# Patient Record
Sex: Male | Born: 1950 | Race: White | Hispanic: No | Marital: Single | Smoking: Current every day smoker
Health system: Southern US, Community
[De-identification: ages and names within clinical notes are randomized; demographics above are authoritative.]

## PROBLEM LIST (undated history)

## (undated) DIAGNOSIS — I219 Acute myocardial infarction, unspecified: Secondary | ICD-10-CM

## (undated) DIAGNOSIS — F32A Depression, unspecified: Secondary | ICD-10-CM

## (undated) DIAGNOSIS — Z923 Personal history of irradiation: Secondary | ICD-10-CM

## (undated) DIAGNOSIS — R0789 Other chest pain: Secondary | ICD-10-CM

## (undated) DIAGNOSIS — R002 Palpitations: Secondary | ICD-10-CM

## (undated) DIAGNOSIS — F17200 Nicotine dependence, unspecified, uncomplicated: Secondary | ICD-10-CM

## (undated) DIAGNOSIS — F419 Anxiety disorder, unspecified: Secondary | ICD-10-CM

## (undated) DIAGNOSIS — R0600 Dyspnea, unspecified: Secondary | ICD-10-CM

## (undated) DIAGNOSIS — J449 Chronic obstructive pulmonary disease, unspecified: Secondary | ICD-10-CM

## (undated) DIAGNOSIS — Z9221 Personal history of antineoplastic chemotherapy: Secondary | ICD-10-CM

## (undated) DIAGNOSIS — I2510 Atherosclerotic heart disease of native coronary artery without angina pectoris: Secondary | ICD-10-CM

## (undated) DIAGNOSIS — F410 Panic disorder [episodic paroxysmal anxiety] without agoraphobia: Secondary | ICD-10-CM

## (undated) DIAGNOSIS — C801 Malignant (primary) neoplasm, unspecified: Secondary | ICD-10-CM

## (undated) DIAGNOSIS — M8580 Other specified disorders of bone density and structure, unspecified site: Secondary | ICD-10-CM

## (undated) DIAGNOSIS — F329 Major depressive disorder, single episode, unspecified: Secondary | ICD-10-CM

## (undated) HISTORY — DX: Chronic obstructive pulmonary disease, unspecified: J44.9

## (undated) HISTORY — DX: Atherosclerotic heart disease of native coronary artery without angina pectoris: I25.10

## (undated) HISTORY — DX: Nicotine dependence, unspecified, uncomplicated: F17.200

## (undated) HISTORY — DX: Personal history of irradiation: Z92.3

## (undated) HISTORY — DX: Other specified disorders of bone density and structure, unspecified site: M85.80

---

## 1978-12-13 HISTORY — PX: INGUINAL HERNIA REPAIR: SHX194

## 1979-03-15 HISTORY — PX: HERNIA REPAIR: SHX51

## 1999-09-05 ENCOUNTER — Encounter: Payer: Self-pay | Admitting: General Practice

## 1999-09-05 ENCOUNTER — Encounter: Admission: RE | Admit: 1999-09-05 | Discharge: 1999-09-05 | Payer: Self-pay | Admitting: General Practice

## 1999-09-06 ENCOUNTER — Encounter: Admission: RE | Admit: 1999-09-06 | Discharge: 1999-09-06 | Payer: Self-pay | Admitting: General Practice

## 1999-09-06 ENCOUNTER — Encounter: Payer: Self-pay | Admitting: General Practice

## 1999-11-01 ENCOUNTER — Ambulatory Visit (HOSPITAL_COMMUNITY): Admission: RE | Admit: 1999-11-01 | Discharge: 1999-11-01 | Payer: Self-pay | Admitting: Neurosurgery

## 1999-11-01 ENCOUNTER — Encounter: Payer: Self-pay | Admitting: Neurosurgery

## 1999-11-15 ENCOUNTER — Ambulatory Visit (HOSPITAL_COMMUNITY): Admission: RE | Admit: 1999-11-15 | Discharge: 1999-11-15 | Payer: Self-pay | Admitting: Neurosurgery

## 1999-11-15 ENCOUNTER — Encounter: Payer: Self-pay | Admitting: Neurosurgery

## 1999-11-29 ENCOUNTER — Ambulatory Visit (HOSPITAL_COMMUNITY): Admission: RE | Admit: 1999-11-29 | Discharge: 1999-11-29 | Payer: Self-pay | Admitting: Neurosurgery

## 1999-11-29 ENCOUNTER — Encounter: Payer: Self-pay | Admitting: Neurosurgery

## 2000-07-14 HISTORY — PX: SPINE SURGERY: SHX786

## 2000-12-24 HISTORY — PX: BACK SURGERY: SHX140

## 2001-11-26 ENCOUNTER — Encounter: Payer: Self-pay | Admitting: Neurosurgery

## 2001-11-26 ENCOUNTER — Ambulatory Visit (HOSPITAL_COMMUNITY): Admission: RE | Admit: 2001-11-26 | Discharge: 2001-11-26 | Payer: Self-pay | Admitting: Neurosurgery

## 2001-12-21 ENCOUNTER — Encounter: Payer: Self-pay | Admitting: Neurosurgery

## 2001-12-24 ENCOUNTER — Ambulatory Visit (HOSPITAL_COMMUNITY): Admission: RE | Admit: 2001-12-24 | Discharge: 2001-12-25 | Payer: Self-pay | Admitting: Neurosurgery

## 2001-12-24 ENCOUNTER — Encounter: Payer: Self-pay | Admitting: Neurosurgery

## 2002-01-17 ENCOUNTER — Encounter: Admission: RE | Admit: 2002-01-17 | Discharge: 2002-02-18 | Payer: Self-pay | Admitting: Neurosurgery

## 2002-03-02 ENCOUNTER — Ambulatory Visit (HOSPITAL_COMMUNITY): Admission: RE | Admit: 2002-03-02 | Discharge: 2002-03-02 | Payer: Self-pay | Admitting: Neurosurgery

## 2002-03-02 ENCOUNTER — Encounter: Payer: Self-pay | Admitting: Neurosurgery

## 2002-03-23 ENCOUNTER — Encounter: Admission: RE | Admit: 2002-03-23 | Discharge: 2002-04-12 | Payer: Self-pay

## 2002-04-22 ENCOUNTER — Encounter: Admission: RE | Admit: 2002-04-22 | Discharge: 2002-07-21 | Payer: Self-pay

## 2002-05-02 ENCOUNTER — Encounter: Admission: RE | Admit: 2002-05-02 | Discharge: 2002-05-23 | Payer: Self-pay

## 2003-07-15 HISTORY — PX: CORONARY STENT PLACEMENT: SHX1402

## 2003-12-27 ENCOUNTER — Inpatient Hospital Stay (HOSPITAL_COMMUNITY): Admission: EM | Admit: 2003-12-27 | Discharge: 2003-12-29 | Payer: Self-pay | Admitting: Emergency Medicine

## 2004-01-30 ENCOUNTER — Ambulatory Visit (HOSPITAL_COMMUNITY): Admission: RE | Admit: 2004-01-30 | Discharge: 2004-01-31 | Payer: Self-pay | Admitting: Cardiology

## 2006-02-02 ENCOUNTER — Ambulatory Visit: Payer: Self-pay | Admitting: Family Medicine

## 2006-06-19 ENCOUNTER — Ambulatory Visit: Payer: Self-pay | Admitting: Internal Medicine

## 2006-06-19 LAB — CONVERTED CEMR LAB
RBC count: 4.4 10*6/uL
WBC, blood: 8.7 10*3/uL

## 2006-06-24 ENCOUNTER — Encounter: Payer: Self-pay | Admitting: Internal Medicine

## 2006-06-24 DIAGNOSIS — G4700 Insomnia, unspecified: Secondary | ICD-10-CM | POA: Insufficient documentation

## 2006-06-24 DIAGNOSIS — M1990 Unspecified osteoarthritis, unspecified site: Secondary | ICD-10-CM | POA: Insufficient documentation

## 2006-06-24 DIAGNOSIS — I499 Cardiac arrhythmia, unspecified: Secondary | ICD-10-CM | POA: Insufficient documentation

## 2006-06-24 DIAGNOSIS — I2510 Atherosclerotic heart disease of native coronary artery without angina pectoris: Secondary | ICD-10-CM | POA: Insufficient documentation

## 2006-06-24 DIAGNOSIS — F329 Major depressive disorder, single episode, unspecified: Secondary | ICD-10-CM | POA: Insufficient documentation

## 2006-06-24 DIAGNOSIS — I252 Old myocardial infarction: Secondary | ICD-10-CM | POA: Insufficient documentation

## 2006-06-24 DIAGNOSIS — M545 Low back pain, unspecified: Secondary | ICD-10-CM | POA: Insufficient documentation

## 2006-06-24 DIAGNOSIS — F19239 Other psychoactive substance dependence with withdrawal, unspecified: Secondary | ICD-10-CM | POA: Insufficient documentation

## 2006-06-24 DIAGNOSIS — F411 Generalized anxiety disorder: Secondary | ICD-10-CM | POA: Insufficient documentation

## 2006-06-24 DIAGNOSIS — K5900 Constipation, unspecified: Secondary | ICD-10-CM | POA: Insufficient documentation

## 2006-06-24 DIAGNOSIS — F19939 Other psychoactive substance use, unspecified with withdrawal, unspecified: Secondary | ICD-10-CM | POA: Insufficient documentation

## 2006-07-03 ENCOUNTER — Ambulatory Visit: Payer: Self-pay | Admitting: Internal Medicine

## 2006-07-31 ENCOUNTER — Ambulatory Visit: Payer: Self-pay | Admitting: Internal Medicine

## 2006-07-31 LAB — CONVERTED CEMR LAB
Cholesterol: 134 mg/dL (ref 0–200)
Digitoxin Lvl: 0.6 ng/mL — ABNORMAL LOW (ref 0.8–2.0)
HDL: 42 mg/dL (ref >39)
LDL Cholesterol: 75 mg/dL (ref 0–99)
Total CHOL/HDL Ratio: 3.2
Triglycerides: 84 mg/dL (ref <150)
VLDL: 17 mg/dL (ref 0–40)

## 2006-08-19 ENCOUNTER — Ambulatory Visit: Payer: Self-pay | Admitting: Internal Medicine

## 2006-08-19 DIAGNOSIS — J439 Emphysema, unspecified: Secondary | ICD-10-CM | POA: Insufficient documentation

## 2006-08-19 DIAGNOSIS — H539 Unspecified visual disturbance: Secondary | ICD-10-CM | POA: Insufficient documentation

## 2006-08-19 LAB — CONVERTED CEMR LAB: Inflenza A Ag: NEGATIVE

## 2006-08-24 ENCOUNTER — Telehealth (INDEPENDENT_AMBULATORY_CARE_PROVIDER_SITE_OTHER): Payer: Self-pay | Admitting: *Deleted

## 2006-08-25 ENCOUNTER — Encounter (INDEPENDENT_AMBULATORY_CARE_PROVIDER_SITE_OTHER): Payer: Self-pay | Admitting: Internal Medicine

## 2006-08-28 DIAGNOSIS — H269 Unspecified cataract: Secondary | ICD-10-CM | POA: Insufficient documentation

## 2006-09-18 ENCOUNTER — Encounter (INDEPENDENT_AMBULATORY_CARE_PROVIDER_SITE_OTHER): Payer: Self-pay | Admitting: Internal Medicine

## 2007-03-04 ENCOUNTER — Encounter (INDEPENDENT_AMBULATORY_CARE_PROVIDER_SITE_OTHER): Payer: Self-pay | Admitting: Internal Medicine

## 2007-03-12 ENCOUNTER — Encounter (INDEPENDENT_AMBULATORY_CARE_PROVIDER_SITE_OTHER): Payer: Self-pay | Admitting: Internal Medicine

## 2007-05-28 ENCOUNTER — Ambulatory Visit: Payer: Self-pay | Admitting: Internal Medicine

## 2007-05-28 DIAGNOSIS — E7800 Pure hypercholesterolemia, unspecified: Secondary | ICD-10-CM | POA: Insufficient documentation

## 2007-05-31 LAB — CONVERTED CEMR LAB
ALT: 17 units/L (ref 0–53)
AST: 19 units/L (ref 0–37)
Albumin: 4.2 g/dL (ref 3.5–5.2)
Alkaline Phosphatase: 54 units/L (ref 39–117)
BUN: 22 mg/dL (ref 6–23)
Basophils Absolute: 0 10*3/uL (ref 0.0–0.1)
Basophils Relative: 0 % (ref 0–1)
CO2: 25 meq/L (ref 19–32)
Calcium: 9.3 mg/dL (ref 8.4–10.5)
Chloride: 107 meq/L (ref 96–112)
Cholesterol: 139 mg/dL (ref 0–200)
Creatinine, Ser: 1.17 mg/dL (ref 0.40–1.50)
Digitoxin Lvl: 0.9 ng/mL (ref 0.8–2.0)
Eosinophils Absolute: 0.2 10*3/uL (ref 0.2–0.7)
Eosinophils Relative: 2 % (ref 0–5)
Glucose, Bld: 85 mg/dL (ref 70–99)
HCT: 39.1 % (ref 39.0–52.0)
HDL: 44 mg/dL (ref >39)
Hemoglobin: 13.1 g/dL (ref 13.0–17.0)
LDL Cholesterol: 82 mg/dL (ref 0–99)
Lymphocytes Relative: 25 % (ref 12–46)
Lymphs Abs: 2.2 10*3/uL (ref 0.7–4.0)
MCHC: 33.5 g/dL (ref 30.0–36.0)
MCV: 89.5 fL (ref 78.0–100.0)
Monocytes Absolute: 0.6 10*3/uL (ref 0.1–1.0)
Monocytes Relative: 7 % (ref 3–12)
Neutro Abs: 5.8 10*3/uL (ref 1.7–7.7)
Neutrophils Relative %: 66 % (ref 43–77)
Platelets: 211 10*3/uL (ref 150–400)
Potassium: 4.9 meq/L (ref 3.5–5.3)
RBC: 4.37 M/uL (ref 4.22–5.81)
RDW: 15.4 % (ref 11.5–15.5)
Sodium: 141 meq/L (ref 135–145)
Total Bilirubin: 0.5 mg/dL (ref 0.3–1.2)
Total CHOL/HDL Ratio: 3.2
Total Protein: 7.3 g/dL (ref 6.0–8.3)
Triglycerides: 63 mg/dL (ref <150)
VLDL: 13 mg/dL (ref 0–40)
WBC: 8.9 10*3/uL (ref 4.0–10.5)

## 2007-10-20 ENCOUNTER — Ambulatory Visit: Payer: Self-pay | Admitting: Internal Medicine

## 2007-10-20 DIAGNOSIS — B351 Tinea unguium: Secondary | ICD-10-CM | POA: Insufficient documentation

## 2007-10-20 DIAGNOSIS — R252 Cramp and spasm: Secondary | ICD-10-CM | POA: Insufficient documentation

## 2007-10-21 ENCOUNTER — Encounter (INDEPENDENT_AMBULATORY_CARE_PROVIDER_SITE_OTHER): Payer: Self-pay | Admitting: Internal Medicine

## 2007-11-05 ENCOUNTER — Telehealth (INDEPENDENT_AMBULATORY_CARE_PROVIDER_SITE_OTHER): Payer: Self-pay | Admitting: Internal Medicine

## 2007-11-22 ENCOUNTER — Telehealth (INDEPENDENT_AMBULATORY_CARE_PROVIDER_SITE_OTHER): Payer: Self-pay | Admitting: *Deleted

## 2008-01-20 ENCOUNTER — Ambulatory Visit: Payer: Self-pay | Admitting: Internal Medicine

## 2008-01-20 DIAGNOSIS — N63 Unspecified lump in unspecified breast: Secondary | ICD-10-CM | POA: Insufficient documentation

## 2008-01-24 ENCOUNTER — Ambulatory Visit (HOSPITAL_COMMUNITY): Admission: RE | Admit: 2008-01-24 | Discharge: 2008-01-24 | Payer: Self-pay | Admitting: Internal Medicine

## 2008-01-24 ENCOUNTER — Encounter (INDEPENDENT_AMBULATORY_CARE_PROVIDER_SITE_OTHER): Payer: Self-pay | Admitting: Internal Medicine

## 2008-02-02 ENCOUNTER — Ambulatory Visit (HOSPITAL_COMMUNITY): Admission: RE | Admit: 2008-02-02 | Discharge: 2008-02-02 | Payer: Self-pay | Admitting: General Surgery

## 2008-02-02 ENCOUNTER — Encounter (INDEPENDENT_AMBULATORY_CARE_PROVIDER_SITE_OTHER): Payer: Self-pay | Admitting: General Surgery

## 2008-03-03 ENCOUNTER — Encounter (INDEPENDENT_AMBULATORY_CARE_PROVIDER_SITE_OTHER): Payer: Self-pay | Admitting: Internal Medicine

## 2008-03-29 ENCOUNTER — Encounter (INDEPENDENT_AMBULATORY_CARE_PROVIDER_SITE_OTHER): Payer: Self-pay | Admitting: Internal Medicine

## 2008-09-13 ENCOUNTER — Ambulatory Visit (HOSPITAL_COMMUNITY): Admission: RE | Admit: 2008-09-13 | Discharge: 2008-09-13 | Payer: Self-pay | Admitting: Internal Medicine

## 2008-09-13 ENCOUNTER — Ambulatory Visit: Payer: Self-pay | Admitting: Internal Medicine

## 2008-09-20 ENCOUNTER — Telehealth (INDEPENDENT_AMBULATORY_CARE_PROVIDER_SITE_OTHER): Payer: Self-pay | Admitting: Internal Medicine

## 2009-01-09 ENCOUNTER — Telehealth (INDEPENDENT_AMBULATORY_CARE_PROVIDER_SITE_OTHER): Payer: Self-pay | Admitting: Internal Medicine

## 2009-01-10 ENCOUNTER — Encounter (INDEPENDENT_AMBULATORY_CARE_PROVIDER_SITE_OTHER): Payer: Self-pay | Admitting: Internal Medicine

## 2009-03-16 ENCOUNTER — Emergency Department (HOSPITAL_COMMUNITY): Admission: EM | Admit: 2009-03-16 | Discharge: 2009-03-16 | Payer: Self-pay | Admitting: Emergency Medicine

## 2009-09-07 HISTORY — PX: NM MYOCAR PERF WALL MOTION: HXRAD629

## 2009-10-24 ENCOUNTER — Ambulatory Visit (HOSPITAL_COMMUNITY): Admission: RE | Admit: 2009-10-24 | Discharge: 2009-10-24 | Payer: Self-pay | Admitting: General Surgery

## 2010-04-08 ENCOUNTER — Emergency Department (HOSPITAL_COMMUNITY): Admission: EM | Admit: 2010-04-08 | Discharge: 2010-04-08 | Payer: Self-pay | Admitting: Emergency Medicine

## 2010-04-08 ENCOUNTER — Encounter: Payer: Self-pay | Admitting: Orthopedic Surgery

## 2010-04-11 ENCOUNTER — Ambulatory Visit: Payer: Self-pay | Admitting: Orthopedic Surgery

## 2010-04-11 DIAGNOSIS — S43109A Unspecified dislocation of unspecified acromioclavicular joint, initial encounter: Secondary | ICD-10-CM | POA: Insufficient documentation

## 2010-05-02 ENCOUNTER — Ambulatory Visit: Payer: Self-pay | Admitting: Orthopedic Surgery

## 2010-05-03 ENCOUNTER — Encounter: Payer: Self-pay | Admitting: Orthopedic Surgery

## 2010-05-14 ENCOUNTER — Encounter (HOSPITAL_COMMUNITY)
Admission: RE | Admit: 2010-05-14 | Discharge: 2010-06-13 | Payer: Self-pay | Source: Home / Self Care | Admitting: Orthopedic Surgery

## 2010-05-21 ENCOUNTER — Ambulatory Visit: Payer: Self-pay | Admitting: Orthopedic Surgery

## 2010-05-27 ENCOUNTER — Encounter: Payer: Self-pay | Admitting: Orthopedic Surgery

## 2010-05-27 ENCOUNTER — Telehealth: Payer: Self-pay | Admitting: Orthopedic Surgery

## 2010-05-28 ENCOUNTER — Telehealth: Payer: Self-pay | Admitting: Orthopedic Surgery

## 2010-06-11 ENCOUNTER — Encounter: Payer: Self-pay | Admitting: Orthopedic Surgery

## 2010-07-02 ENCOUNTER — Ambulatory Visit: Payer: Self-pay | Admitting: Orthopedic Surgery

## 2010-07-09 ENCOUNTER — Telehealth: Payer: Self-pay | Admitting: Orthopedic Surgery

## 2010-07-09 ENCOUNTER — Encounter (INDEPENDENT_AMBULATORY_CARE_PROVIDER_SITE_OTHER): Payer: Self-pay | Admitting: *Deleted

## 2010-07-17 ENCOUNTER — Telehealth: Payer: Self-pay | Admitting: Orthopedic Surgery

## 2010-07-24 ENCOUNTER — Encounter: Payer: Self-pay | Admitting: Orthopedic Surgery

## 2010-08-13 NOTE — Progress Notes (Signed)
Summary: Medical records faxed as per req Dr Hilda Lias  Phone Note Outgoing Call   Call placed to: Specialist Summary of Call: Faxed all office notes as per request and authorization to Dr Hilda Lias. Initial call taken by: Cammie Sickle,  May 28, 2010 1:19 PM

## 2010-08-13 NOTE — Letter (Signed)
[facility]   Imported By: Magdalene River 09/24/2006 14:31:23  _____________________________________________________________________  External Attachment:    Type:   Image     Comment:   External Document

## 2010-08-13 NOTE — Letter (Signed)
Summary: [facility]  [facility]   Imported By: Magdalene River 09/01/2006 13:45:35  _____________________________________________________________________  External Attachment:    Type:   Image     Comment:   External Document
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
## 2010-08-13 NOTE — Progress Notes (Signed)
Summary: wants Percocet prescription  Phone Note Call from Patient   Summary of Call: Keevan Wolz (08/16/2050) wants new Percocet prescription [phone] Initial call taken by: Jacklynn Ganong,  May 27, 2010 1:10 PM    Prescriptions: PERCOCET 5-325 MG TABS (OXYCODONE-ACETAMINOPHEN) 1 by mouth q 4 as needed pain  #84 x 0   Entered and Authorized by:   Fuller Canada MD   Signed by:   Fuller Canada MD on 05/27/2010   Method used:   Print then Give to Patient   RxID:   [id number]

## 2010-08-13 NOTE — Letter (Signed)
Summary: Historic Patient File  Historic Patient File   Imported By: Curtis Sites 05/03/2009 10:11:36  _____________________________________________________________________  External Attachment:    Type:   Image     Comment:   External Document

## 2010-08-13 NOTE — Progress Notes (Signed)
Summary: Need info faxed for disability forms  Phone Note Call from Patient   Caller: Patient Call For: NURSE Summary of Call: need dates he saw MD here and why.need names of surgeon he was referred to and dates. Please fax to [phone]. Initial call taken by: Carlye Grippe,  January 09, 2009 2:50 PM  Follow-up for Phone Call        He can request to have his medical records sent if he needs all that information.   Follow-up by: Erle Crocker MD,  January 10, 2009 8:16 AM  Additional Follow-up for Phone Call Additional follow up Details #1::        MD informed that patient need dates &reasons of previous visits with MD and name of surgeon he was referred to. dates/reasons and surgeon name faxed to patient. Additional Follow-up by: Carlye Grippe,  January 10, 2009 8:38 AM

## 2010-08-13 NOTE — Miscellaneous (Signed)
  last percocet script   Clinical Lists Changes

## 2010-08-13 NOTE — Consult Note (Signed)
Summary: [facility]  [facility]   Imported By: Pablo Lawrence 03/23/2007 15:08:10  _____________________________________________________________________  External Attachment:    Type:   Image     Comment:   External Document

## 2010-08-13 NOTE — Miscellaneous (Signed)
Summary: Rehab initial evaluation  Rehab initial evaluation   Imported By: Jacklynn Ganong 05/21/2010 08:27:46  _____________________________________________________________________  External Attachment:    Type:   Image     Comment:   External Document

## 2010-08-13 NOTE — Progress Notes (Signed)
----   Converted from flag ---- ---- 08/20/2006 1:55 PM, Lutricia Horsfall wrote: Pt scheduled for appt. with Dr. Lita Mains 08/25/06 at 0850.  Pt notified.SG  ---- 08/20/2006 1:55 PM, Erle Crocker MD wrote: Pls. schedule for appt. to evaluation of abnormal night vision with halos around vision--normal digoxin level.  ---- 08/20/2006 11:57 AM, Lutricia Horsfall wrote: per Dr. Lita Mains office, if medical diagnosis they are able to bill Medicare-if he has not met his deductible he will be responsible for $130-SG  ---- 08/19/2006 4:14 PM, Erle Crocker MD wrote: Pls. see if with a referral Dr. Lita Mains can bill medicare for evaluation of patient's complaint of "halos" around his vision. ------------------------------

## 2010-08-13 NOTE — Progress Notes (Signed)
Summary: Update on back pain  Phone Note Call from Patient   Summary of Call: Pt called states that since stopping neurontin, back pain has gotten quite a bit better, states with neurontin pain was 9/10, states that it is now back to baseline of 6-7/10. Initial call taken by: Lutricia Horsfall LPN,  September 20, 2008 8:51 AM  Follow-up for Phone Call        Noted. Will send copy of office and phone notes to psychiatrist, Dr. Donell Beers. Follow-up by: Erle Crocker MD,  September 20, 2008 9:17 AM
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
## 2010-08-13 NOTE — Op Note (Signed)
Summary: [facility]  [facility]   Imported By: Donneta Romberg 03/03/2008 17:20:05  _____________________________________________________________________  External Attachment:    Type:   Image     Comment:   External Document

## 2010-08-13 NOTE — Miscellaneous (Signed)
Summary: Rehab Report  Rehab Report   Imported By: Cammie Sickle 06/12/2010 18:42:16  _____________________________________________________________________  External Attachment:    Type:   Image     Comment:   External Document

## 2010-08-13 NOTE — Letter (Signed)
Summary: patient request for visit dates  patient request for visit dates   Imported By: Curtis Sites 01/17/2009 10:31:32  _____________________________________________________________________  External Attachment:    Type:   Image     Comment:   External Document

## 2010-08-13 NOTE — Letter (Signed)
Summary: Influenza vaccine document  Influenza vaccine document   Imported By: Jacklynn Ganong 05/03/2010 08:15:32  _____________________________________________________________________  External Attachment:    Type:   Image     Comment:   External Document
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
## 2010-08-13 NOTE — Letter (Signed)
Summary: History form  History form   Imported By: Jacklynn Ganong 04/11/2010 15:58:43  _____________________________________________________________________  External Attachment:    Type:   Image     Comment:   External Document
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
## 2010-08-13 NOTE — Progress Notes (Signed)
Summary: Lipitor vs Crestor  Phone Note Call from Patient   Reason for Call: Talk to Nurse Complaint: Headache Summary of Call: patient's cramping stopped after not taking  the simvastin for 2 weeks, he then started taking it again and the cramps came back.he is worried because he had a heart attack 3 years and says he was on lipitor but it isn't covered anymore and didn't know what to do, if he had to change to another medication or change the dosage of his simvastin. Please advise  Initial call taken by: Donneta Romberg,  November 05, 2007 11:09 AM  Follow-up for Phone Call        Provider Notified Follow-up by: Lutricia Horsfall,  November 05, 2007 11:36 AM  Additional Follow-up for Phone Call Additional follow up Details #1::        He will need to be on a statin but he is less likely to have muscle aches with Lipitor or Crestor.  He needs to provide [place] with his insurance information so we can try to get one of these approved. Additional Follow-up by: Erle Crocker MD,  November 05, 2007 11:41 AM    Additional Follow-up for Phone Call Additional follow up Details #2::    Left message for patient to call back.  .................................................................Marland KitchenMarland KitchenLutricia Horsfall  November 05, 2007 11:46 AM   Additional Follow-up for Phone Call Additional follow up Details #3:: Details for Additional Follow-up Action Taken: Advised patient that he needs to contact his insurance, pt verbalized understanding. Additional Follow-up by: Lutricia Horsfall,  November 08, 2007 8:54 AM

## 2010-08-13 NOTE — Assessment & Plan Note (Signed)
Summary: 2 WK RE-CK LT SHOULDER/[employer] HMO/CAF   Visit Type:  Follow-up Referring Provider:  ap er Primary Provider:  [facility]  CC:  left shoulder pain.  History of Present Illness: VZ:DGLO III a.c. separation  DOI 04-08-10.  Treatment: Physical therapy, medication.  Complaints: He complains of pain, but better movement.  Today, scheduled for: recheck.  AP Xrays 9/26/1 left shoulder, hit head also.  Meds: Plavix, Crestor, Lisinopril, Venlafaxine, Trazadone, xanax, ASA, Hydroxy 50mg  5 daily, Percocet 5   continue on the Percocet at this time. He is progressing well with his physical therapy according to his notes.  No x-rays are needed.  He'll come in next week to get his prescription.  Allergies: No Known Drug Allergies  Physical Exam  Additional Exam:  LEFT shoulder. Swelling is going down he still has prominence of the LEFT clavicle is neurovascularly intact in his LEFT upper extremity with improving. 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 Patient Level II (75643)  Patient Instructions: 1)  Continue Physical therapy and return in 6 weeks    Orders Added: 1)  Est. Patient Level II [32951]

## 2010-08-13 NOTE — Miscellaneous (Signed)
Summary: Surgery Referral  Clinical Lists Changes  Orders: Added new Referral order of Surgical Referral (Surgery) - Signed   Appt scheduled for 01/27/08 at 1000 with Dr. Leticia Penna.  Pt notified.  Sherrie Gardner  January 24, 2008 2:11 PM

## 2010-08-13 NOTE — Progress Notes (Signed)
Summary: 11/19/07 cultre result  Phone Note Outgoing Call   Call placed by: Sonny Dandy,  Nov 22, 2007 9:24 AM Summary of Call: Results given to patient, voices understanding   Initial call taken by: Sonny Dandy,  Nov 22, 2007 9:24 AM

## 2010-08-13 NOTE — Assessment & Plan Note (Signed)
Summary: ?flu   Vital Signs:  Patient Profile:   60 Years Old Male Height:     69 inches (175.26 cm) O2 Sat:      94 % Temp:     97.6 degrees F oral Pulse rate:   77 / minute Resp:     8 per minute BP sitting:   104 / 58  Pt. in pain?   no  Vitals Entered By: Lutricia Horsfall (August 19, 2006 3:26 PM) Oxygen therapy Room Air              Is Patient Diabetic? No Nutritional Status Normal  Have you ever been in a relationship where you felt threatened, hurt or afraid?No   Does patient need assistance? Functional Status Self care Ambulation Normal   Pt states that psy. had started him on new medication and he began to have cold chills and sweats.  Psy told him that he believed it was the flu and to see PCP.  Chief Complaint:  psychiatrist says i have the flu.  History of Present Illness: Psychiatrist changed medications 08/06/06 due to high dose of trazodone and feeling more anxious.  Since then pt c/o chills, headaches w/o visual symptoms x halo that have been going on for a while.  Called psychiatrist 31 Jan who told pt it wasn't from the medication change and that he had the flu and needed to see his regular doctor.  Pt concerned because dr. didn't give follow up for 6 months.  Notes changes ineffective for sleep. did not have flu shot this years but desires to have now.       This is a 60 years old male who presents with COPD follow up.  The patient complains of shortness of breath, but denies chest tightness, wheezing, and cough.  The symptoms often occur daily.  The patients functional status is limited with moderate activities.    Still concerned about halos around vision but says he doesn't have money to go to eye doctor because medicare doesn't pay for eye doctor.  Prior Medications: LISINOPRIL 5 MG TABS (LISINOPRIL) once daily XANAX 1 MG TABS (ALPRAZOLAM) qid TRIAMTERENE-HCTZ 37.5-25 MG TABS (TRIAMTERENE-HCTZ) once daily DIGITEK 0.125 MG TABS (DIGOXIN) once  daily ASPIRIN 325 MG TABS (ASPIRIN) once daily PLAVIX 75 MG TABS (CLOPIDOGREL BISULFATE) once daily HYDROXYZINE HCL 25 MG TABS (HYDROXYZINE HCL) 1 by mouth in morning, 2 by mouth in afternoon Current Allergies: No known allergies   Past Surgical History:    Reviewed history from 06/24/2006 and no changes required:       back surgery 2003       inguinal hernia repair 20 years ago       two stents.   Social History:    Single    Former Smoker   Risk Factors:  Tobacco use:  quit    Year quit:  2005 Passive smoke exposure:  no Drug use:  no HIV high-risk behavior:  no Alcohol use:  no Exercise:  no   Review of Systems       The patient complains of chills and sleep disorder.  The patient denies fever, weakness, and malaise.     Physical Exam  General:     thin, in no acute distress. Alert and oriented X 3.  Nose:     minimal Erythema and swelling with clear drainage.  Mouth:     No eyrthema or exudate.     Impression & Recommendations:  Problem # 1:  FEVER (ICD-780.6) subjective and doubt infectious esp. with negative flu swab.  Suspect with headaches this is a side effect of temazepam or even of uncontrolled depression and anxiety.   Problem # 2:  INSOMNIA (ICD-780.52) will start back on trazodone at 400mg  at bedtime.  instructed pt to inform psychiatrist that he was evaluated, doesn't have the flu and med was changed back.  Pt appears to need the elevated trazodone dose for his depression too.  Problem # 3:  DISTURBANCE, VISUAL NOS (ICD-368.9) ? cause but doubt this is from spiriva as he has been off this for 2 weeks.  will see if medicare will pay for ophtho with referral.  Problem # 4:  COPD (ICD-496) resume spiriva. The following medications were removed from the medication list:    Spiriva Handihaler 18 Mcg Caps (Tiotropium bromide monohydrate) .Marland Kitchen... 1 puff once daily   Medications Added to Medication List This Visit: 1)  Effexor 75 Mg Tabs  (Venlafaxine hcl) .... 2 by mouth two times a day 2)  Hydroxyzine Hcl 25 Mg Tabs (Hydroxyzine hcl) .Marland Kitchen.. 1 by mouth in morning, 2 by mouth in afternoon 3)  Trazodone Hcl 150 Mg Tabs (Trazodone hcl) .... 2.66 by mouth at bedtime  Other Orders: Flu A+B (57846)   Laboratory Results   Other Tests    Wet [place]/KOH Influenza: negative

## 2010-08-14 HISTORY — PX: SHOULDER SURGERY: SHX246

## 2010-08-15 NOTE — Consult Note (Signed)
Summary: Consult report from Dr. Teryl Lucy  Consult report from Dr. Teryl Lucy   Imported By: Jacklynn Ganong 07/29/2010 13:54:30  _____________________________________________________________________  External Attachment:    Type:   Image     Comment:   External Document

## 2010-08-15 NOTE — Progress Notes (Signed)
Summary: Wants Percocet prescription  Phone Note Call from Patient   Summary of Call: [name] (July 16, 2050) asked if you will write a Percocet prescription with enough to last until he sees Dr. Dion Saucier 07/24/10 [phone] Initial call taken by: Jacklynn Ganong,  July 17, 2010 3:26 PM  Follow-up for Phone Call        ok pick up script  Follow-up by: Fuller Canada MD,  July 17, 2010 3:34 PM    Prescriptions: PERCOCET 5-325 MG TABS (OXYCODONE-ACETAMINOPHEN) 1 by mouth q 4 as needed pain  #84 x 0   Entered and Authorized by:   Fuller Canada MD   Signed by:   Fuller Canada MD on 07/17/2010   Method used:   Print then Give to Patient   RxID:   [id number]

## 2010-08-15 NOTE — Miscellaneous (Signed)
Summary: Dr. Dion Saucier referral completed and faxed  Clinical Lists Changes

## 2010-08-15 NOTE — Assessment & Plan Note (Signed)
Summary: 6 WK LT [facility] HMO/CAF   Referring Provider:  ap er Primary Provider:  [facility]   History of Present Illness: This is a 60 year old male had a type III or type V a.c. separation treated conservatively with therapy he says his throat feels like there is pressure on it from the clavicle  He got a 2nd opinion from Dr. Hilda Lias rec Mumford procedure   I think he be better served if he is going to have surgery with a.c. joint and acromioclavicular coracoclavicular ligament reconstruction recommended he see Dr.Landau for that evaluation.  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 Patient Level II (09811)  Medications Added to Medication List This Visit: 1)  Percocet 5-325 Mg Tabs (Oxycodone-acetaminophen) .Marland Kitchen.. 1 q 6 as needed pain  Patient Instructions: 1)  Dr Dion Saucier Consult Gr 5 separation AC joint  Prescriptions: PERCOCET 5-325 MG TABS (OXYCODONE-ACETAMINOPHEN) 1 q 6 as needed pain  #40 x 0   Entered and Authorized by:   Fuller Canada MD   Signed by:   Fuller Canada MD on 07/02/2010   Method used:   Print then Give to Patient   RxID:   [id number]    Orders Added: 1)  Orthopedic Surgeon Referral Gaylord Shih Surgeon] 2)  Est. Patient Level II [78469]

## 2010-08-15 NOTE — Progress Notes (Signed)
Summary: patient said his [employer] accepted,but as out of network @[facility]  Phone Note Call from Patient   Caller: Patient Summary of Call: Patient called to relay that he spoke w/[facility] office; aware that our office faxed the referral today - states called to their office and that they are not in-network with his [facility] PPO Medicare there but that they will take this insurance as non-network and cover @70 %.  Initial call taken by: Cammie Sickle,  July 09, 2010 1:59 PM

## 2010-08-16 ENCOUNTER — Ambulatory Visit (HOSPITAL_BASED_OUTPATIENT_CLINIC_OR_DEPARTMENT_OTHER)
Admission: RE | Admit: 2010-08-16 | Discharge: 2010-08-17 | Disposition: A | Discharge status: Home / Self Care | Payer: Medicare PPO | Attending: Orthopedic Surgery | Admitting: Orthopedic Surgery

## 2010-08-16 DIAGNOSIS — S43109A Unspecified dislocation of unspecified acromioclavicular joint, initial encounter: Secondary | ICD-10-CM | POA: Insufficient documentation

## 2010-08-16 DIAGNOSIS — J449 Chronic obstructive pulmonary disease, unspecified: Secondary | ICD-10-CM | POA: Insufficient documentation

## 2010-08-16 DIAGNOSIS — I2510 Atherosclerotic heart disease of native coronary artery without angina pectoris: Secondary | ICD-10-CM | POA: Insufficient documentation

## 2010-08-16 DIAGNOSIS — J4489 Other specified chronic obstructive pulmonary disease: Secondary | ICD-10-CM | POA: Insufficient documentation

## 2010-08-16 DIAGNOSIS — Y929 Unspecified place or not applicable: Secondary | ICD-10-CM | POA: Insufficient documentation

## 2010-08-16 DIAGNOSIS — X58XXXA Exposure to other specified factors, initial encounter: Secondary | ICD-10-CM | POA: Insufficient documentation

## 2010-08-16 LAB — POCT I-STAT, CHEM 8
BUN: 19 mg/dL (ref 6–23)
Calcium, Ion: 1.11 mmol/L — ABNORMAL LOW (ref 1.12–1.32)
Chloride: 106 mEq/L (ref 96–112)
Creatinine, Ser: 0.9 mg/dL (ref 0.4–1.5)
Glucose, Bld: 95 mg/dL (ref 70–99)
HCT: 38 % — ABNORMAL LOW (ref 39.0–52.0)
Hemoglobin: 12.9 g/dL — ABNORMAL LOW (ref 13.0–17.0)
Potassium: 4.5 mEq/L (ref 3.5–5.1)
Sodium: 137 mEq/L (ref 135–145)
TCO2: 26 mmol/L (ref 0–100)

## 2010-08-27 NOTE — Op Note (Signed)
**Note [name]** NAMECON, ARGANBRIGHT NO.:  [id number]  MEDICAL RECORD NO.:  [id number]           PATIENT TYPE:  LOCATION:                                 FACILITY:  PHYSICIAN:  Eulas Post, MD    DATE OF BIRTH:  [birth year]  DATE OF PROCEDURE: DATE OF DISCHARGE:                              OPERATIVE REPORT   PREOPERATIVE DIAGNOSIS:  Left acromioclavicular joint dislocation.  POSTOPERATIVE DIAGNOSIS:  Left acromioclavicular joint dislocation.  OPERATIVE PROCEDURE:  Left acromioclavicular joint reconstruction.  ANESTHESIA:  General.  PREOPERATIVE INDICATIONS:  Mr. Deaunte Dente is a 60 year old gentleman who has had a now subacute left AC joint dislocation with disruption of the coracoclavicular ligaments.  He elected to undergo reconstruction of the coracoclavicular ligaments and AC joint repair.  The risks, benefits, and alternatives were discussed before the procedure including but not limited to the risks of infection, bleeding, nerve injury, hardware prominence, hardware failure, recurrent AC joint separation, chronic shoulder dysfunction, cardiopulmonary complications, among others, and he is willing to proceed.  OPERATIVE IMPLANTS:  I used an [employer] GraftRope with a size 5-mm gracilis tendon and a button under the coracoid with a clavicular washer superiorly and a 10 x 5.5-mm biointerference screw for the clavicle and I also tied the ends of the graft loops around the clavicle after exiting the button.  OPERATIVE PROCEDURE:  The patient was brought to the operating room and placed in the supine position.  IV antibiotics were given.  General anesthesia was administered.  Left upper extremity was prepped and draped in the usual sterile fashion.  He was in a beach-chair position. Saber incision was made over the distal clavicle.  Dissection was carried down, and the pectoralis fascia was incised along the line of the clavicle and then teed down to the coracoid.   I gained access to the coracoid, and the coracoclavicular ligament remnants were excised in order to allow room for working underneath the clavicle.  Once the coracoid was exposed, I used a guide pin and drilled the coracoid with a size 5.5-mm reamer and then I did same for the clavicle directly in the middle approximately 35 mm from the clavicular tip.  I then passed a wire through the coracoid and through the clavicle and then prepared the graft.  A FiberLoop was used on either end of the graft and it was tensioned and then sewn to the button as per protocol.  I then passed the passing stitches through the clavicle and through the coracoid and then delivered the graft and flipped the button.  I could directly visualize that the button had securely flipped.  I then reduced the shoulder anatomically and secured the button over the clavicle with the #5 FiberWire.  Once this had been achieved, I used C-arm to confirm reduction and position.  I had initially left the distal clavicle in order to allow for anatomic reduction confirmation.  Once this had been achieved, I resected my distal clavicle at approximately 15 mm from the tip.  I then passed the free limbs of the graft around the clavicle and secured them burying the  knot inferior to the clavicle.  This served as a backup fixation of the graft behind the interference screw.  Once this had been completed, I took final C-arm pictures and I irrigated the wounds copiously and closed with 0 Vicryl interrupted buried figure-of- eight for the fascia as well as 3-0 for subcutaneous tissue and Steri-Strips and sterile gauze followed by a sling.  He was awakened and returned to PACU in stable and satisfactory condition.  There were no complications, he tolerated the procedure well.     Eulas Post, MD     JPL/MEDQ  D:  08/16/2010  T:  08/17/2010  Job:  [id number]  Electronically Signed by Teryl Lucy MD on 08/27/2010 11:51:04 AM

## 2010-08-29 ENCOUNTER — Encounter: Payer: Self-pay | Admitting: Orthopedic Surgery

## 2010-09-10 NOTE — Letter (Signed)
Summary: [facility] Office notes Dr Beckey Rutter Office notes Dr Dion Saucier   Imported By: Cammie Sickle 09/02/2010 17:52:20  _____________________________________________________________________  External Attachment:    Type:   Image     Comment:   External Document

## 2010-09-25 ENCOUNTER — Encounter: Payer: Self-pay | Admitting: Orthopedic Surgery

## 2010-10-01 NOTE — Letter (Signed)
Summary: [facility] Office note Dr Beckey Rutter Office note Dr Dion Saucier   Imported By: Cammie Sickle 09/27/2010 11:17:21  _____________________________________________________________________  External Attachment:    Type:   Image     Comment:   External Document

## 2010-10-02 LAB — BASIC METABOLIC PANEL
BUN: 18 mg/dL (ref 6–23)
CO2: 27 mEq/L (ref 19–32)
Calcium: 9.2 mg/dL (ref 8.4–10.5)
Chloride: 101 mEq/L (ref 96–112)
Creatinine, Ser: 1.12 mg/dL (ref 0.4–1.5)
GFR calc Af Amer: >60 mL/min (ref >60)
GFR calc non Af Amer: >60 mL/min (ref >60)
Glucose, Bld: 92 mg/dL (ref 70–99)
Potassium: 5 mEq/L (ref 3.5–5.1)
Sodium: 131 mEq/L — ABNORMAL LOW (ref 135–145)

## 2010-10-02 LAB — CBC
HCT: 40.8 % (ref 39.0–52.0)
Hemoglobin: 13.7 g/dL (ref 13.0–17.0)
MCHC: 33.6 g/dL (ref 30.0–36.0)
MCV: 89.8 fL (ref 78.0–100.0)
Platelets: 187 10*3/uL (ref 150–400)
RBC: 4.55 MIL/uL (ref 4.22–5.81)
RDW: 15.2 % (ref 11.5–15.5)
WBC: 9.7 10*3/uL (ref 4.0–10.5)

## 2010-10-02 LAB — DIFFERENTIAL
Basophils Absolute: 0 10*3/uL (ref 0.0–0.1)
Basophils Relative: 1 % (ref 0–1)
Eosinophils Absolute: 0.2 10*3/uL (ref 0.0–0.7)
Eosinophils Relative: 2 % (ref 0–5)
Lymphocytes Relative: 23 % (ref 12–46)
Lymphs Abs: 2.2 10*3/uL (ref 0.7–4.0)
Monocytes Absolute: 0.5 10*3/uL (ref 0.1–1.0)
Monocytes Relative: 5 % (ref 3–12)
Neutro Abs: 6.7 10*3/uL (ref 1.7–7.7)
Neutrophils Relative %: 70 % (ref 43–77)

## 2010-10-02 LAB — PROTIME-INR
INR: 1.07 (ref 0.00–1.49)
Prothrombin Time: 13.8 seconds (ref 11.6–15.2)

## 2010-10-02 LAB — APTT: aPTT: 29 seconds (ref 24–37)

## 2010-10-18 LAB — PROTIME-INR
INR: 1.1 (ref 0.00–1.49)
Prothrombin Time: 14.3 seconds (ref 11.6–15.2)

## 2010-10-18 LAB — BASIC METABOLIC PANEL
BUN: 18 mg/dL (ref 6–23)
CO2: 28 mEq/L (ref 19–32)
Calcium: 8.8 mg/dL (ref 8.4–10.5)
Chloride: 102 mEq/L (ref 96–112)
Creatinine, Ser: 0.95 mg/dL (ref 0.4–1.5)
GFR calc Af Amer: >60 mL/min (ref >60)
GFR calc non Af Amer: >60 mL/min (ref >60)
Glucose, Bld: 93 mg/dL (ref 70–99)
Potassium: 4.5 mEq/L (ref 3.5–5.1)
Sodium: 136 mEq/L (ref 135–145)

## 2010-10-18 LAB — POCT CARDIAC MARKERS
CKMB, poc: <1 ng/mL — ABNORMAL LOW (ref 1.0–8.0)
CKMB, poc: <1 ng/mL — ABNORMAL LOW (ref 1.0–8.0)
Myoglobin, poc: 43.4 ng/mL (ref 12–200)
Myoglobin, poc: 76.4 ng/mL (ref 12–200)
Troponin i, poc: 0.06 ng/mL (ref 0.00–0.09)
Troponin i, poc: <0.05 ng/mL (ref 0.00–0.09)

## 2010-10-18 LAB — CBC
HCT: 35.7 % — ABNORMAL LOW (ref 39.0–52.0)
Hemoglobin: 12.3 g/dL — ABNORMAL LOW (ref 13.0–17.0)
MCHC: 34.4 g/dL (ref 30.0–36.0)
MCV: 89.5 fL (ref 78.0–100.0)
Platelets: 167 10*3/uL (ref 150–400)
RBC: 3.99 MIL/uL — ABNORMAL LOW (ref 4.22–5.81)
RDW: 15.5 % (ref 11.5–15.5)
WBC: 7.6 10*3/uL (ref 4.0–10.5)

## 2010-10-18 LAB — DIFFERENTIAL
Basophils Absolute: 0 10*3/uL (ref 0.0–0.1)
Basophils Relative: 0 % (ref 0–1)
Eosinophils Absolute: 0.2 10*3/uL (ref 0.0–0.7)
Eosinophils Relative: 3 % (ref 0–5)
Lymphocytes Relative: 25 % (ref 12–46)
Lymphs Abs: 1.9 10*3/uL (ref 0.7–4.0)
Monocytes Absolute: 0.3 10*3/uL (ref 0.1–1.0)
Monocytes Relative: 5 % (ref 3–12)
Neutro Abs: 5.1 10*3/uL (ref 1.7–7.7)
Neutrophils Relative %: 67 % (ref 43–77)

## 2010-10-18 LAB — D-DIMER, QUANTITATIVE: D-Dimer, Quant: 0.41 ug/mL-FEU (ref 0.00–0.48)

## 2010-10-18 LAB — APTT: aPTT: 29 seconds (ref 24–37)

## 2010-11-26 NOTE — Op Note (Signed)
NAMEJONTA, [name] NO.:  [id number]   MEDICAL RECORD NO.:  [id number]          PATIENT TYPE:  AMB   LOCATION:  DAY                           FACILITY:  [facility]   PHYSICIAN:  Tilford Pillar, MD      DATE OF BIRTH:  07-19-1950   DATE OF PROCEDURE:  02/02/2008  DATE OF DISCHARGE:  02/02/2008                               OPERATIVE REPORT   PREOPERATIVE DIAGNOSES:  Left breast mass.   POSTOPERATIVE DIAGNOSES:  Left breast mass.   PROCEDURE:  Excisional biopsy of the left breast mass via 2-cm supra-  areolar incision.   SURGEON:  Dr. Tilford Pillar.   ANESTHESIA:  General with laryngeal mask airway, local anesthetic 0.5%  Sensorcaine/Marcaine plain.   SPECIMEN:  Left breast mass.   ESTIMATED BLOOD LOSS:  Minimal.   INDICATIONS:  The patient is a 60 year old male who presented to my  office at referral by Dr. Jen Mow for notable nodularity under his left  nipple.  Actually, he had this previously but had had this excised and  had not had any issues of it for several years, then he noticed that it  again returned.  He denies any tenderness.  He has had no nipple  discharge.  No overlying skin changes.  It has slowly increased in size  and he has undergone surprisingly a mammography, although he is an  extremely thin male, as well as a ultrasound of this lesion with concern  of some solid components to this mass.  Therefore, on an evaluation and  consultation visit the patient had in my office, I did recommend he  proceed with an excisional biopsy.  The risks, benefits, and  alternatives including the risk but not limited to bleeding, infection,  recurrence, more worse, and the possibility of nipple loss as this mass  appears to be intermittently associated with the overlying skin of the  nipple.  The patient's questions and concerns were addressed, and the  patient was consented for planned procedure.   OPERATION:  The patient was taken to the operating room and  was placed  in the supine position on the operating table, at which time a general  anesthetic was administered.  Once the patient was asleep, he had a  laryngeal mask airway placed and his left chest wall was prepped with  DuraPrep solution and draped in an appropriate fashion.  At this point,  a supra-areolar incision was created with a scalpel.  Additional  dissection down through subcuticular tissue was carried out using a  combination of sharp Metzenbaum scissor dissection and blunt dissection  with a hemostat as well as electrocautery dissection.  Care was taken to  dissect the mass from the overlying areolar and nipple skin with all  attempts to maintain perfusion to this portion of the chest wall.  The  mass was eventually dissected free circumferentially.  Once it was free,  it was placed on the back table and was sent as permanent specimen to  pathology.  At this point, the wound was irrigated.  Hemostasis was  excellent, and at this time  I did use a 4-0 Monocryl and running  subcuticular suture to reapproximate the skin edges.  With the skin  edges reapproximated, local anesthetic was instilled.  I did monitor the  nipple for several minutes with noting that it appeared viable with good  vascular flow to it.  There is no evidence of any blanching or ischemia  to the nipple.  At this point, I did place benzoin around the incision  and then placed half-inch Steri-Strips over the incision.  The patient  tolerated the procedure well.  He was allowed to come out of general  anesthetic, was transferred back to regular hospital bed, and was  transferred to postanesthetic care unit in a stable condition.  At the  conclusion of the procedure, all instruments, sponge, and needle counts  were correct.  The patient tolerated the procedure well.      Tilford Pillar, MD  Electronically Signed     BZ/MEDQ  D:  02/02/2008  T:  02/03/2008  Job:  [id number]   cc:   Dr. Jen Mow
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
## 2010-11-29 NOTE — Op Note (Signed)
[place]  Patient:    [name], [name] Visit Number: [id number] MRN: [id number]          Service Type: DSU Location: 3000 [id number] Attending Physician:  Mariam Dollar Dictated by:   Donzetta Sprung. Roney Jaffe., M.D. Proc. Date: 12/24/01 Admit Date:  12/24/2001 Discharge Date: 12/25/2001                             Operative Report  PREOPERATIVE DIAGNOSIS:  Right S1 radiculopathy.  PROCEDURE:  Lumbar laminectomy and microdiskectomy L5-S1 right.  SURGEON:  Donzetta Sprung. Roney Jaffe., M.D.  ASSISTANT:  Annie Sable, Montez Hageman., M.D.  ANESTHESIA:  General endotracheal anesthesia.  INDICATIONS:  The patient is a very pleasant 60 year old gentleman who has had longstanding back and right leg pain that has been refractory to anti-inflammatories, epidural steroid injections, and physical therapy.  The patients recent MRI imaging showed a large ruptured disk with free fragment compressing the right S1 nerve root with severe right S1 foraminal stenosis. The patient was recommended lumbar microdiskectomy.  I discussed the risks and benefits of surgery with him.  He understood and agreed to proceed forward.  DESCRIPTION OF PROCEDURE:  The patient was brought into the operating room and induced under general anesthesia.  The patient was prone on the Wilson frame and his back was prepped and draped in the usual sterile fashion.  After infiltration with 10 cc of lidocaine with epinephrine and preoperative x-ray localizing the L5-S1 disk space a midline incision was made with a 20 blade scalpel.  Subfascial and subperiosteal dissection was carried out with Bovie electrocautery of the L5-S1 lamina.  Intraoperative x-ray confirmed localization of the L5-S1 disk space.  Laminotomy was begun with a 3 mm Kerrison punch removing the inferior aspect of lamina of L5.  The medial facet complex of superior aspect of lamina of S1 exposing the ligamentum flavum. This was removed in  piecemeal fashion.  The thecal sac was then visualized. The operating microscope was draped, brought into the field, and under microscopic illumination, the remaining of the ligamentum flavum was removed in piecemeal fashion exposing the proximal aspect of the S1 nerve root.  The foramen of the S1 nerve root was radically opened up and then a 4 Penfield was used to palpate the large bulbous disk fragment compressing the proximal aspect of the S1 nerve root.  It was used to dissect the S1 nerve root medially and then using a nerve retractor the S1 nerve root was reflected medially.  Annulotomy was made with an 11 blade scalpel and pituitary rongeurs were used to radically clean out the disk space.  The endplates were scraped down with downgoing Epstein curets.  Several large fragments were removed from centrally as well as from just underneath the ligament inferior to the disk space.  These were all underbitten with osteophyte remover, downgoing Epstein curet, and pituitary rongeurs.  At the end of the diskectomy, there was no further compression on the right S1 nerve root.  Thecal sac was noted to be widely decompressed, explored with an angle hockeystick, coronary dilated. Meticulous hemostasis was maintained.  Gelfoam was overlaid on top of the dura. The fascia was closed with 0 interrupted Vicryl, the subcutaneous tissue closed with 2-0 interrupted Vicryl, the skin was closed with running 4-0 subcuticular, Benzoin and Steri-Strips were applied.  The patient was taken to the recovery room in stable condition.  At the end  of the case, all sponge, needle, and instrument counts were correct. Dictated by:   Donzetta Sprung. Roney Jaffe., M.D. Attending Physician:  Mariam Dollar DD:  12/24/01 TD:  12/27/01 Job: [id number]

## 2010-11-29 NOTE — Cardiovascular Report (Signed)
[name], [name]                          ACCOUNT NO.:  [id number]   MEDICAL RECORD NO.:  [id number]                   PATIENT TYPE:  INP   LOCATION:  2928                                 FACILITY:  [facility]   PHYSICIAN:  Thereasa Solo. Little, M.D.              DATE OF BIRTH:  Nov 24, 1950   DATE OF PROCEDURE:  12/27/2003  DATE OF DISCHARGE:                              CARDIAC CATHETERIZATION   INDICATIONS FOR TEST:  This 60 year old disabled gentleman developed severe  chest pain around 3 or 3:30 this afternoon.  The pain lasted about two hours  in duration.  He is pain free in the emergency room and has minimal ST  segment elevation in the inferior leads, but already has positive CK and  troponins.  Because of his young age and the acute event, he is brought to  the cath lab for emergency cardiac catheterization.   PROCEDURE:  After obtaining informed consent, the patient was prepped and  draped in the usual sterile fashion exposing the right groin. Following  local anesthetic with 1% Xylocaine, the Seldinger technique was employed and  a 6 [place] introducer sheath was placed into the right femoral artery.  Left  and right coronary arteriography and ventriculography done at the end of the  case and a distal aortogram was performed.   COMPLICATIONS:  None.   EQUIPMENT:  6 French Judkins configuration diagnostic catheters.   INTERVENTIONAL EQUIPMENT:  See below.   TOTAL CONTRAST:  240 mL.   RESULTS:   HEMODYNAMIC MONITORING:  Central aortic pressure was 124/66.  Left  ventricular pressure was 121/7.  There was no aortic valve gradient noted at  time of pullback.   VENTRICULOGRAPHY:  Ventriculography performed after the percutaneous  intervention to his RCA revealed normal left ventricular systolic function  with no significant wall motion abnormalities inferiorly.  No mitral  regurgitation was seen.  Ejection fraction was greater than 55%.  The end-  diastolic pressure was  17.   Distal aortogram done at the level of the renals showed no evidence of  abdominal aortic aneurysm, no evidence of renal artery stenosis.   CORONARY ARTERIOGRAPHY:  No calcification on fluoroscopy was appreciated.   1. Left main normal trifurcated and was normal.  2. LAD:  The left anterior descending extended down towards the apex of the     heart and it basically bifurcated into either twin LAD's or equal     diagonal and ongoing LAD.  There were two more proximal diagonal branches     and this entire system was free of disease.  3. Optional diagonal medium size vessel.  Free of disease.  4. Circumflex:  The circumflex gives rise to a single OM vessel.  This area     in the proximal portion had an concentric area of narrowing of 80%.     There was brisk TIMI-3 flow past this spot.  5. RCA:  The RCA had a focal subtotal 99% proximal area of narrowing with     mid and distal vessel being free of disease.   Because of the high grade stenosis in the RCA, arrangements were made for  emergency intervention.  A JL-4 guide catheter with side holes was used and  a short Luge wire was used and placed down the distal portion of the RCA.  A  Maverick 2.75 x 15 balloon was placed in the area of obstruction and a total  of three inflations were performed ranging from 8 atmospheres for 45 seconds  to 4 atmospheres for 45 seconds.  With this, the area opened up but still  was significantly narrowed.   A Taxus stent 3.0 x 20-mm in length was placed at both the proximal and  distal portion of the lesion was well covered.  It was initially deployed at  15 atmospheres for 58 seconds with final inflation being 16 atmospheres for  55 seconds.  The lesion which was 95% before intervention now appeared to be  completely normal.  There was no evidence of any dissection or thrombus  formation.  There was brisk TIMI-3 flow pre and post intervention.   The patient was given integrilin double bolus and  will be continued on  integrilin for 12 additional hours.  He will also need 300 mg of oral  Plavix.  His last ACT at the termination of the case was 276.   In regards to the proximal OM lesion, I suspect this area will need to be  fixed, but I will give him about two to three weeks to get over this acute  inferior event and at that time will bring him back to the lab as an  outpatient for intervention.                                               Thereasa Solo. Little, M.D.    ABL/MEDQ  D:  12/27/2003  T:  12/28/2003  Job:  [id number]   cc:   Sharlot Gowda, M.D.  [address]  [place], [place] [address]  Fax: [phone]

## 2010-11-29 NOTE — Consult Note (Signed)
NAMEGRACESON, NICHELSON                          ACCOUNT NO.:  [id number]   MEDICAL RECORD NO.:  [id number]                   PATIENT TYPE:  REC   LOCATION:  [facility]                                 FACILITY:  [facility]   PHYSICIAN:  Sondra Come, D.O.                 DATE OF BIRTH:  1951-06-27   DATE OF CONSULTATION:  05/30/2002  DATE OF DISCHARGE:  05/23/2002                                   CONSULTATION   HISTORY OF PRESENT ILLNESS:  [name] returns to the clinic today as  scheduled for reevaluation.  He was last seen on 05/16/02 and at that time I  started him on Duragesic 25 mcg q.72h. for his post laminectomy syndrome  with chronic back pain and lower extremity radicular symptoms.  He also has  degenerative disk disease of the lumbar spine status post L5-S1 laminectomy  and microdiskectomy.  Mr. Arocho states that he had had about 20% relief of  his pain with the Duragesic and currently rates his pain as severe which  correlates to a 6/10 on the subjective scale although he checks 9/10 on the  health and history form.  His function and quality of life indices remain  declined and his sleep is very poor.  He continues to follow with his  psychiatrist in this regard.  We discussed further treatment options.  He  denies illicit drugs.  He denies bowel and bladder dysfunction.  I reviewed  health and history form and a 14 point review of systems.   PHYSICAL EXAMINATION:  GENERAL:  Healthy-appearing male in no acute  distress.  VITAL SIGNS:  Blood pressure is 138/53, pulse 79, respirations 20, O2  saturation 97% on room air.  BACK:  Decreased lumbar lordosis with tenderness to palpation bilaterally  lumbar paraspinal muscles.  Manual muscle testing reveals 5/5 strength  bilateral lower extremities with giveaway weakness diffusely in the right  lower extremity with some mild cogwheeling which does seem to be somewhat  improved compared to the last visit.  Sensory examination is intact to  light  touch bilateral lower extremities at this time.  Reflexes are 2+/4  bilaterally patellae and medial hamstrings, and 0/4 bilateral Achilles.  Straight-leg-raise is negative bilaterally in the seated position.  The  patient does not display any exaggerated response with palpatory examination  today compared to the last visit.   IMPRESSION:  1. Post laminectomy syndrome.  2. Degenerative disk disease of lumbar spine and status post L5-S1     laminectomy and microdiskectomy.  3. Chronic low back pain, modestly improved with Duragesic 25 mcg.  4. Psychological overlay.  5. History of drug abuse.   PLAN:  1. [name] and I spent approximately 15 minutes discussing further     treatment options.  At this point, it is reasonable to increase the     Duragesic to 50 mcg q.72h. and monitor its  effectiveness.  I will write a     prescription for #10 without refills.  2. Continue Neurontin 600 mg one p.o. t.i.d.  3. Continue trazodone at bedtime per psychiatry.  4. Continue TENS unit.  5. We will consider reinitiating physical therapy as the patient's pain     level decreases.  6. The patient to return to clinic in one month for reevaluation or sooner     as needed.  If symptoms are not improved any further with the increase in     Duragesic, would consider switching to another pharmacologically long-     acting     opioid such as a sustained release morphine preparation.  7. The patient to begin using stool softener as needed.   The patient was educated about the findings and recommendations and  understands.                                               Sondra Come, D.O.    JJW/MEDQ  D:  05/30/2002  T:  05/30/2002  Job:  [id number]

## 2010-11-29 NOTE — Cardiovascular Report (Signed)
NAMERANDAL, GOENS                          ACCOUNT NO.:  [id number]   MEDICAL RECORD NO.:  [id number]                   PATIENT TYPE:  OIB   LOCATION:  2899                                 FACILITY:  [facility]   PHYSICIAN:  Thereasa Solo. Little, M.D.              DATE OF BIRTH:  1950-12-07   DATE OF PROCEDURE:  01/30/2004  DATE OF DISCHARGE:                              CARDIAC CATHETERIZATION   INDICATIONS FOR TEST:  Mr. [name] is a 60 year old male who presented December 27, 2003 with an inferior myocardial infarction and had emergency stenting  to his RCA.  At the time of that intervention he also had an asymptomatic  80% circumflex lesion.  He is brought back now for intervention to this  area.  He had complained of recurrent chest pain since his event December 27, 2003 and because of this a diagnostic catheterization to re-evaluate the  LAD, but in particular to reassess the stent in the right coronary artery  was performed.   PROCEDURE:  The patient was prepped and draped in the usual sterile fashion  exposing the right groin.  Following local anesthetic with 1% Xylocaine, the  Seldinger technique was employed and a 6 [place] introducer sheath was placed  into the right femoral artery.  Left and right coronary arteriography,  ventriculography in the RAO projection and percutaneous intervention to  circumflex was performed.   COMPLICATIONS:  None.   EQUIPMENT:  6 French Judkins configuration catheters (see interventional  equipment below).   HEMODYNAMIC MONITORING:  Central aortic pressure was 120/66.  Left  ventricular pressure 116/9 with 2 mm gradient noted at the time of pullback  (insignificant).   VENTRICULOGRAPHY:  Ventriculography in the RAO projection using 25 mL of  contrast at 12 mL per second revealed good opacification of the left  ventricle.  Mitral valve prolapse without mitral regurgitation was seen.  The inferior wall was severely hypokinetic.  The ejection fraction  was  approximately 40%.  The left ventricular end-diastolic pressure was slightly  elevated at 18.   CORONARY ARTERIOGRAPHY:  On fluoroscopy, the stent was noted in the  distribution of the RCA.   1. Left main normal.  2. LAD:  The left anterior descending did not cross the apex of the heart,     but extended down towards the apex.  There was a large first diagonal     branch and this system was free of disease.  3. Circumflex:  The circumflex was a co-dominant vessel.  It gave rise to     multiple OM vessels.  In the proximal portion of the OM was a hypolucent     80% area that was focal and eccentric.  4. The OMs were free of disease.  5. Right coronary artery:  The stent in the mid portion of the right     coronary artery was widely patent.  The posterior lateral  branches and     the posterior descending artery were free of disease.   Because of the high grade stenosis in the circumflex, PCI was undertaken.  A  6 [place] JL-4 guide catheter adequately cannulated the left main.  A short  Luge wire was then placed down the distal circumflex.  A 3.0 x 18-mm Taxus  stent was then placed in such a manner that both the proximal and distal  portion of the lesion was well covered and none of the OM vessels were  jailed by the stent.  It was initially deployed at 13 atmospheres for 61  seconds with the final inflation being 15 atmospheres for 57 seconds.  The  area that had been hypolucent 80% narrowed preintervention now appears to be  slightly hyperexpanded.  There is no evidence of any dissection or thrombus.   The patient was given IV integrilin and given heparin bolus of 4200 units  with therapeutic INR at the end of the procedure.  The integrilin will be  continued for 12 additional hours.  The patient should be ready for  discharge to home in the morning.  Followup in my office in three weeks.  Will continue Plavix for six full months from this date.                                                Thereasa Solo. Little, M.D.    ABL/MEDQ  D:  01/30/2004  T:  01/30/2004  Job:  [id number]   cc:   Sharlot Gowda, M.D.  [address]  [place], [place] [address]  Fax: [phone]
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
## 2010-11-29 NOTE — Consult Note (Signed)
[name], [name]                          ACCOUNT NO.:  [id number]   MEDICAL RECORD NO.:  [id number]                   PATIENT TYPE:  REC   LOCATION:  [facility]                                 FACILITY:  [facility]   PHYSICIAN:  Sondra Come, D.O.                 DATE OF BIRTH:  January 10, 1951   DATE OF CONSULTATION:  05/16/2002  DATE OF DISCHARGE:                                   CONSULTATION   REASON FOR CONSULTATION:  The patient returns to clinic today for  reevaluation.  He was last seen on April 25, 2002.  In the interim, he has  weaned himself from Ultram and has noted nearly complete resolution of the  lesions on his arms and face, which I suspect was an allergic reaction to  the Ultram.  He continues to complain mainly of low back pain radiating into  his right lower extremity.  His pain today is a 9/10 on a subjective scale.  Function and quality-of-life indices remain declined, sleep is fair to poor.  He continues following with the psychiatrist for anxiety, depression and his  nonrestorative sleep.  In terms of pain management, he has found only modest  improvement with Flexeril, Neurontin and ibuprofen.  He has also tried a  TENS unit with questionable relief.  We have been in discussion of further  medication management for his pain, however, previously he was using illicit  drugs, but states that he has not used any marijuana or other drugs since  prior to his first visit on March 24, 2002.  I reviewed the health and  history form and 14-point review of systems.  The patient feels like if he  can get his pain under better control, he will be able to become a little  more active and states that he really does not like taking this many  medications; we discussed this at length.   PHYSICAL EXAMINATION:  GENERAL:  Physical exam reveals a healthy-appearing  male in no acute distress.  VITAL SIGNS:  Blood pressure 125/59, pulse 89, respirations 18, O2  saturation 97% on  room air.  BACK:  Examination of the back reveals a level pelvis without scoliosis.  There is tenderness to palpation in bilateral lumbar paraspinal muscles with  a mildly exaggerated response.  NEUROMUSCULAR:  Manual muscle testing is 5/5, bilateral lower extremities,  with cogwheeling in the right lower extremity with manual muscle testing.  Sensory examination is intact to light touch, bilateral lower extremities,  at this time.  Muscle stretch reflexes are 2+/4, bilateral patellar and  medial hamstrings, and 0/4, bilateral Achilles.  Straight leg raise is  negative bilaterally but with cogwheeling in the right lower extremity.   IMPRESSION:  1. Post-laminectomy syndrome.  2. Degenerative disk disease of lumbar spine, status post L5-S1 laminectomy     and microdiskectomy.  3. Psychological overlay contributing to the patient's pain perception.  4. History of drug abuse.   PLAN:  1. I had a long discussion with the patient regarding treatment options.     Given his lack of improvement with non-narcotic medications to this     point, will give him a trial of Duragesic 25 mcg every 72 hours, #5     without refills.  Side-effect profile was reviewed.  The patient     understands that given his history of substance abuse, that he is at risk     for further abuse and we will monitor him closely for this.  The patient     understands that refills will not be provided unless he brings his used     patches with him for proper count.  He understands that he is not be     early with his medications.  We will continue to monitor his functional     status and if he is not gaining any functional improvement with the     medication, then we will likely wean him from it.  2. Continue Neurontin 600 mg three times daily and consider increasing if     radicular symptoms are not improving.  3. Continue Motrin and Flexeril for now.  4. Patient to return to clinic in two weeks for reevaluation.   The  patient was educated in the above findings and recommendations and  understands.  No barriers to communication.                                               Sondra Come, D.O.    JJW/MEDQ  D:  05/16/2002  T:  05/17/2002  Job:  [id number]   cc:   Donalee Citrin, M.D.  [address]  [place]  [place] [address]  Fax: [phone]

## 2010-11-29 NOTE — Discharge Summary (Signed)
NAMEONEILL, BAIS                          ACCOUNT NO.:  [id number]   MEDICAL RECORD NO.:  [id number]                   PATIENT TYPE:  OIB   LOCATION:  6529                                 FACILITY:  [facility]   PHYSICIAN:  Thereasa Solo. Little, M.D.              DATE OF BIRTH:  02-23-1951   DATE OF ADMISSION:  01/30/2004  DATE OF DISCHARGE:  01/31/2004                                 DISCHARGE SUMMARY   DISCHARGE DIAGNOSES:  1. Coronary disease, status post elective circumflex Taxus stenting this     admission.  2. Diaphragmatic myocardial infarction, December 27, 2003, treated with Taxus     right coronary artery stent.  3. Hyperlipidemia.  4. Anxiety disorder and obsessive/compulsive disorder.   HOSPITAL COURSE:  The patient is a 60 year old male followed by Dr. Clarene Duke  who presented to [facility] ER December 27, 2003, with an acute DMI.  He was taken to  the cath lab and had a Taxus stent to the RCA.  He had good LV function.  At  that time, he had a 70-80% narrowing of the circumflex.  He was discharged  and brought back for an elective circumflex intervention.  This was done  January 30, 2004.  The previously placed RCA stent was widely patent.  Circumflex had a proximal 80% narrowing.  There was some inferior wall  hypokinesis and mitral valve prolapse without MR.  EF was 40% at this cath.  He was treated with PTCA and Taxus stenting to the circumflex and tolerated  this well.  He was kept on Integrilin for 12 hours.  He could be discharged  on January 31, 2004.  He did have problems with anxiety and had a panic attack  regarding his medications in the hospital.  Apparently, they had substituted  his Niaspan and not given his other medications at the same time that he  takes them at home.  He is admitted again for a short procedure, and it may  be best to let him take his own medications from home.   DISCHARGE MEDICATIONS:  1. Lisinopril 5 mg daily.  2. Plavix 75 mg daily.  3. Lipitor 10 mg  daily.  4. Niaspan 500 mg q.h.s.  5. Coated aspirin daily.  6. Effexor 75 mg three tablets daily.  7. Trazodone 150 mg two tablets at bedtime.  8. Xanax 1 mg t.i.d. as needed.  9. Nitroglycerin sublingual p.r.n.   LABORATORY DATA:  Discharge white count 5.6, hemoglobin 13.1, hematocrit 38,  platelets 151,000.  Sodium 138, potassium 3.6, BUN 5, creatinine 0.8.  EKG  reveals sinus rhythm, sinus bradycardia, nonspecific ST changes.   DISPOSITION:  The patient is discharged in stable condition and will follow  up with Dr. Clarene Duke on August 3 at 3:00 p.m.      Abelino Derrick, P.A.  Thereasa Solo. Little, M.D.   Lenard Lance  D:  01/31/2004  T:  01/31/2004  Job:  [id number]

## 2010-11-29 NOTE — Discharge Summary (Signed)
NAMEFONTAINE, HEHL                          ACCOUNT NO.:  [id number]   MEDICAL RECORD NO.:  [id number]                   PATIENT TYPE:  INP   LOCATION:  2928                                 FACILITY:  [facility]   PHYSICIAN:  Thereasa Solo. Little, M.D.              DATE OF BIRTH:  1951/06/28   DATE OF ADMISSION:  12/27/2003  DATE OF DISCHARGE:  12/29/2003                                 DISCHARGE SUMMARY   PRIMARY CARE PHYSICIAN:  Sharlot Gowda, M.D.   ADMISSION DIAGNOSES:  1. Acute myocardial infarction.  2. Tobacco use.  3. Premature family history of coronary artery disease.  4. Unknown lipid status.  5. Chronic back pain.  6. Psychiatric disorder.   DISCHARGE DIAGNOSES:  1. Acute myocardial infarction.  2. Tobacco use.  3. Premature family history of coronary artery disease.  4. Unknown lipid status.  5. Chronic back pain.  6. Psychiatric disorder.  7. Status post emergency cardiac catheterization on June 15,2005 by Dr.     Julieanne Manson.  He was found to have a 95% proximal RCA stenosis which underwent PTCA and  Taxus stenting.  He has residual coronary disease which will need staged  intervention.  1. Hyperlipidemia.   HISTORY OF PRESENT ILLNESS:  Mr. Beedle is a 60 year old white male who had  no prior known CAD.  He presented on December 17, 2002 with complaints of chest  pain and came to the emergency room.  At that time he said the chest pain  started about 3 p.m. and describes it as a chest tightness that felt like it  might explode.  There was associated shortness of breath.  He did feel weak  and nauseated.  It went down his left arm and was rated a 7/10.  He  eventually did vomit, and called a friend who brought to the ER.  In the ER  troponin was 0.16, CK-MB 3.8. on initial onset.  On physical exam his blood  pressure was 144/72, heart rate 62, oxygen saturation 95%.  No significant  abnormalities on physical exam.  At that point he was seen and evaluated by  Dr.  Julieanne Manson who felt that he needed to undergo cardiac  catheterization.  Risks, benefit were discussed with him and he was  agreeable to proceed.  At this point he reviewed that the EKG showed some  minimal ST elevation in leads 2, AVF and the enzymes were positive at that  time.  He was on aspirin, heparin, Nitrol, was pain free but Dr. Clarene Duke felt  that he needed arch and catheterization given his EKG and enzymes.   HOSPITAL COURSE:  On December 28, 2003 Mr. Derrell Lolling underwent urgent cardiac cath  by Dr. Julieanne Manson.  See his dictated report for details.  He was found to  have a 95% focal concentric proximal RCA stenosis.  This is felt to be the  culprit lesion.  He proceeded with PTCA and Taxis stenting to this area.  It  was planned to continue Integrilin for 12 hours post PCI, Plavix 300 mg were  given in the lab, and to continue on Plavix and aspirin.  He did have  significant residual disease in the __________ vessel which would need  staged intervention.  The patient tolerated the procedure well without  complication.  He planned a staged intervention in several weeks.   On December 27, 2003 Mr. Lodes is doing well with stable heart rate and blood  pressure.  At that time he was complaining of some mild chest awareness  during the night that he says was associated with prolonged coughing.  At  that point he was still on his IV nitroglycerin and Integrilin which would  be stopped.  Dr. Clarene Duke planned to follow up his lipid profile and start ACE  inhibitors. We will monitor him and hope for discharge in the morning.   On the morning of December 29, 2003 Mr. Sayre was doing well with no chest  pain, shortness of breath.  Heart rates in the 60's. Blood pressures 100/50.  Lungs are clear.  Heart is in regular rhythm with no rub or gallop.  Catheterization site is stable without  heme or bleed.  Potassium is 3.9,  BUN 9, creatinine 0.8, LDL 106, HDL 26.   At this time he was seen by Dr.  Julieanne Manson.  In regards to his coronary  disease, will treat him with aspirin and Plavix.  His lipid profile is back  and we will initiate Lipitor 10 mg daily and also Niaspan.  We have again  discussed smoking cessation.  He is now on Aspirin, Plavix, ACE and statin.  He is on no beta blocker because of a heart rate of 60.   Will see him back in the office for follow up evaluation and plan for staged  intervention within the upcoming several weeks.  In additional to Dr. Clarene Duke  discussing smoking cessation, he has also been seen by the smoking cessation  consult.  At this point  he was seen by Dr. Julieanne Manson and deemed to be safe for  discharge home.   HOSPITAL CONSULTATIONS:  None.   PROCEDURE:  Arch and cardiac catheterization on December 27, 2003 by Dr. Julieanne Manson. Please see the dictated report for detail. Major findings is a focal  concentric 95% proximal narrowing in the right coronary artery which is felt  to be the culprit lesion.  He proceeded with percutaneous transluminal  coronary angioplasty and Taxus stenting to the area.  Again, see the  dictated report for detail.  He does have significant residual disease with  will need  staged intervention in several week's time.   RADIOLOGY:  Chest x-ray, December 27, 2003 showed chronic pulmonary obstructive  disease without acute infiltrate.   EKG on admission showed some slight ST elevation in leads 2, 3 aVF.  This  was resolved on follow up EKGs and they show sinus rhythm with nonspecific  changes.  Telemetry has shown no significant arrhythmias.   LABORATORY DATA:  On admission CBC shows white count 11,000, hemoglobin  14.0, hematocrit 40.6, platelets 178,000.  At that time INR was 1.3.  At  that time I-STAT creatinine is 1.0, sodium 138, potassium 3.7, BUN 11.  First set of cardiac markers show CK-MB 3.8, troponin 0.16, myoglobin greater than 500.  Second set of markers show CK-MB 21.5, troponin 1.45,  myoglobin  greater than 500, and third set of markers show CK-MB 37.4,  troponin 6.35, myoglobin greater than 500.  The regular _____of cardiac  enzymes show the following:  first set, CK is 1197, CK-MB is 123.2, troponin  28.33. Second set shows CK 848, CK-MB 73.3, troponin 24.84.  Lipid profile  shows total cholesterol 146, triglycerides 71, HDL 26, LDL 106.  At time  discharge home sodium is 137, potassium 3.9, BUN 9, creatinine 0.8, glucose  75.   DISCHARGE MEDICATIONS:  1. Aspirin 325 mg daily.  2. Plavix 75 mg once daily.  3. Lipitor 10 mg once daily.  4. Niaspan 500 mg at h.s.  5. Zestril 5 mg daily.  6. Nitroglycerin 0.4 mg as directed.  7. Effexor XR 75 mg 3 daily.  8. Xanax 1 mg q.6h.  9. Trazodone 200 mg at h.s.   Stop smoking.   No strenuous activity, no lifting of greater than 5 pounds.  No driving or  sexual activity x3 days.   Low cholesterol diet   Clean catheterization site with warm water and soap.   Call  [phone] for any bleeding or increased pain at groin site.   Follow up with Dr. Clarene Duke January 17, 2004 at 4 p.m. He is given the address and  phone number of the office.      Mary B. Easley, P.A.-C.                   Thereasa Solo. Little, M.D.    MBE/MEDQ  D:  12/29/2003  T:  01/01/2004  Job:  [id number]   cc:   Sharlot Gowda, M.D.  [address]  [place], [place] [address]  Fax: [phone]   Dr. Jacqulyn Bath  [facility]

## 2011-04-11 LAB — POCT I-STAT 4, (NA,K, GLUC, HGB,HCT)
Glucose, Bld: 92
HCT: 41
Hemoglobin: 13.9
Operator id: 213421
Potassium: 4.8
Sodium: 136

## 2011-04-11 LAB — BASIC METABOLIC PANEL
BUN: 17
CO2: 30
Calcium: 8.7
Chloride: 106
Creatinine, Ser: 0.89
GFR calc Af Amer: >60
GFR calc non Af Amer: >60
Glucose, Bld: 100 — ABNORMAL HIGH
Potassium: 5.7 — ABNORMAL HIGH
Sodium: 136

## 2011-04-11 LAB — CBC
HCT: 37.3 — ABNORMAL LOW
Hemoglobin: 12.6 — ABNORMAL LOW
MCHC: 33.7
MCV: 89.2
Platelets: 175
RBC: 4.19 — ABNORMAL LOW
RDW: 15.5
WBC: 7.8

## 2011-11-04 ENCOUNTER — Encounter (HOSPITAL_COMMUNITY): Payer: Self-pay

## 2011-11-04 ENCOUNTER — Emergency Department (HOSPITAL_COMMUNITY)
Admission: EM | Admit: 2011-11-04 | Discharge: 2011-11-04 | Disposition: A | Discharge status: Home / Self Care | Payer: Medicare PPO | Attending: Emergency Medicine | Admitting: Emergency Medicine

## 2011-11-04 ENCOUNTER — Emergency Department (HOSPITAL_COMMUNITY): Payer: Medicare PPO

## 2011-11-04 DIAGNOSIS — J40 Bronchitis, not specified as acute or chronic: Secondary | ICD-10-CM | POA: Insufficient documentation

## 2011-11-04 DIAGNOSIS — R0602 Shortness of breath: Secondary | ICD-10-CM | POA: Insufficient documentation

## 2011-11-04 DIAGNOSIS — F17200 Nicotine dependence, unspecified, uncomplicated: Secondary | ICD-10-CM | POA: Insufficient documentation

## 2011-11-04 DIAGNOSIS — I252 Old myocardial infarction: Secondary | ICD-10-CM | POA: Insufficient documentation

## 2011-11-04 DIAGNOSIS — R062 Wheezing: Secondary | ICD-10-CM | POA: Insufficient documentation

## 2011-11-04 DIAGNOSIS — R042 Hemoptysis: Secondary | ICD-10-CM | POA: Insufficient documentation

## 2011-11-04 HISTORY — DX: Major depressive disorder, single episode, unspecified: F32.9

## 2011-11-04 HISTORY — DX: Acute myocardial infarction, unspecified: I21.9

## 2011-11-04 HISTORY — DX: Depression, unspecified: F32.A

## 2011-11-04 HISTORY — DX: Panic disorder (episodic paroxysmal anxiety): F41.0

## 2011-11-04 HISTORY — DX: Anxiety disorder, unspecified: F41.9

## 2011-11-04 MED ORDER — DOXYCYCLINE HYCLATE 100 MG PO CAPS: 100.0000 mg | ORAL_CAPSULE | Freq: Two times a day (BID) | ORAL | Status: AC

## 2011-11-04 MED ORDER — DOXYCYCLINE HYCLATE 100 MG PO TABS
100.0000 mg | ORAL_TABLET | Freq: Once | ORAL | Status: AC
  Administered 2011-11-04: 100 mg via ORAL
  Filled 2011-11-04 (×2): qty 1

## 2011-11-04 MED ORDER — ALBUTEROL SULFATE HFA 108 (90 BASE) MCG/ACT IN AERS
2.0000 | INHALATION_SPRAY | RESPIRATORY_TRACT | Status: DC | PRN
  Administered 2011-11-04: 2 via RESPIRATORY_TRACT
  Filled 2011-11-04: qty 6.7

## 2011-11-04 NOTE — ED Provider Notes (Signed)
History     CSN: [id number]  Arrival date & time 11/04/11  1554   First MD Initiated Contact with Patient 11/04/11 1623      Chief Complaint  Patient presents with  . Hemoptysis     Patient is a 61 y.o. male presenting with cough. The history is provided by the patient and a relative.  Cough This is a recurrent problem. The problem occurs hourly. The problem has been gradually worsening. The cough is productive of blood-tinged sputum. There has been no fever. Associated symptoms include shortness of breath and wheezing. Pertinent negatives include no chest pain and no chills. He has tried nothing for the symptoms. He is a smoker.  PT reports cough for two days He reports this morning he coughed up sputum that was mixed with red blood He reports coughing up less than a cup of blood tinged sputum No epistaxis No hematemesis Reports chronic SOB that is not new He reports he has some wheezing No CP reported He does report mild weakness but this has improved He reports he is on ASA/plavix for previous MI, not on coumadin Reports h/o hemoptysis a month ago but it resolved and never seen by his PCP He still smokes No syncope reported  Past Medical History  Diagnosis Date  . Myocardial infarction   . Depression   . Anxiety   . Panic attacks     Past Surgical History  Procedure Date  . Coronary stent placement     No family history on file.  History  Substance Use Topics  . Smoking status: Current Everyday Smoker  . Smokeless tobacco: Not on file  . Alcohol Use: No      Review of Systems  Constitutional: Negative for chills.  Respiratory: Positive for cough, shortness of breath and wheezing.   Cardiovascular: Negative for chest pain.  All other systems reviewed and are negative.    Allergies  Codeine  Home Medications   Current Outpatient Rx  Name Route Sig Dispense Refill  . ALPRAZOLAM 1 MG PO TABS Oral Take 1 mg by mouth 4 (four) times daily.    .  ASPIRIN EC 81 MG PO TBEC Oral Take 81 mg by mouth every evening.    . ATORVASTATIN CALCIUM 20 MG PO TABS Oral Take 20 mg by mouth every morning.    . B-COMPLEX-C PO Oral Take 1 tablet by mouth every morning.    Marland Kitchen CLOPIDOGREL BISULFATE 75 MG PO TABS Oral Take 75 mg by mouth every morning.    Marland Kitchen DOXEPIN HCL 50 MG PO CAPS Oral Take 50 mg by mouth 2 (two) times daily.    . TRAZODONE HCL 100 MG PO TABS Oral Take 400 mg by mouth at bedtime.    . VENLAFAXINE HCL 75 MG PO TABS Oral Take 150 mg by mouth 2 (two) times daily.    Marland Kitchen DOXYCYCLINE HYCLATE 100 MG PO CAPS Oral Take 1 capsule (100 mg total) by mouth 2 (two) times daily. 20 capsule 0    BP 119/64  Temp(Src) 97.4 F (36.3 C) (Oral)  Resp 18  Ht 5\' 10"  (1.778 m)  Wt 145 lb (65.772 kg)  BMI 20.81 kg/m2  SpO2 99%  Physical Exam CONSTITUTIONAL: Well developed/well nourished HEAD AND FACE: Normocephalic/atraumatic EYES: EOMI/PERRL ENMT: Mucous membranes moist, no blood in nares or in oropharynx NECK: supple no meningeal signs SPINE:entire spine nontender CV: S1/S2 noted, no murmurs/rubs/gallops noted LUNGS:scattered wheezes noted, no distress, no rales noted, he is able to speak  to me clearly ABDOMEN: soft, nontender, no rebound or guarding GU:no cva tenderness NEURO: Pt is awake/alert, moves all extremitiesx4 EXTREMITIES: pulses normal, full ROM SKIN: warm, color normal PSYCH: no abnormalities of mood noted  ED Course  Procedures  Labs Reviewed - No data to display Dg Chest 2 View  11/04/2011  *RADIOLOGY REPORT*  Clinical Data: Hemoptysis  CHEST - 2 VIEW  Comparison: 04/08/2010  Findings: Prominent central pulmonary vasculature with peripheral pruning is stable.  Hyperaeration and central bronchitic changes are stable.  These are findings related to COPD.  No new pulmonary nodule or mass.  No pneumothorax and no pleural effusion. Osteopenia without compression deformity in the thoracic spine.  IMPRESSION: Changes related to COPD.  No  active cardiopulmonary disease.  Original Report Authenticated By: Donavan Burnet, M.D.     1. Bronchitis   2. Hemoptysis      Pt well appearing, no distress.  Ambulatory, report at his baseline SOB No CP, doubt PE Doubt massive hemoptysis, especially since it is improved He reports he will f/u with his PCP this week for pulm referral  Advised to quit smoking Also advised of strict return precautions MDM  Nursing notes reviewed and considered in documentation xrays reviewed and considered   Date: 11/04/2011  Rate: 55  Rhythm: sinus bradycardia  QRS Axis: normal  Intervals: normal  ST/T Wave abnormalities: nonspecific ST changes  Conduction Disutrbances:none  Narrative Interpretation:   Old EKG Reviewed: unchanged          Joya Gaskins, MD 11/04/11 1726

## 2011-11-04 NOTE — Discharge Instructions (Signed)
Bronchitis Bronchitis is a problem of the air tubes leading to your lungs. This problem makes it hard for air to get in and out of the lungs. You may cough a lot because your air tubes are narrow. Going without care can cause lasting (chronic) bronchitis. HOME CARE   Drink enough fluids to keep your pee (urine) clear or pale yellow.   Use a cool mist humidifier.   Quit smoking if you smoke. If you keep smoking, the bronchitis might not get better.   Only take medicine as told by your doctor.  GET HELP RIGHT AWAY IF:   Coughing keeps you awake.   You start to wheeze.   You become more sick or weak.   You have a hard time breathing or get short of breath.   You cough up blood.   Coughing lasts more than 2 weeks.   You have a fever.   Your baby is older than 3 months with a rectal temperature of 102 F (38.9 C) or higher.   Your baby is 3 months old or younger with a rectal temperature of 100.4 F (38 C) or higher.  MAKE SURE YOU:  Understand these instructions.   Will watch your condition.   Will get help right away if you are not doing well or get worse.  Document Released: 12/17/2007 Document Revised: 06/19/2011 Document Reviewed: 06/01/2009 [employer] Patient Information 2012 [employer]. 

## 2011-11-04 NOTE — ED Notes (Signed)
Cough for 2 days

## 2011-11-04 NOTE — ED Notes (Signed)
Pt reports felt lightheaded yesterday and today started coughing up bright red blood.   Denies pain.  Reports last month had coughed up blood clots for 2 days but says went away on its own.

## 2012-05-20 ENCOUNTER — Emergency Department (HOSPITAL_COMMUNITY)
Admission: EM | Admit: 2012-05-20 | Discharge: 2012-05-20 | Disposition: A | Discharge status: Home / Self Care | Payer: Medicare PPO | Attending: Emergency Medicine | Admitting: Emergency Medicine

## 2012-05-20 ENCOUNTER — Encounter (HOSPITAL_COMMUNITY): Payer: Self-pay

## 2012-05-20 DIAGNOSIS — F329 Major depressive disorder, single episode, unspecified: Secondary | ICD-10-CM | POA: Insufficient documentation

## 2012-05-20 DIAGNOSIS — F17200 Nicotine dependence, unspecified, uncomplicated: Secondary | ICD-10-CM | POA: Insufficient documentation

## 2012-05-20 DIAGNOSIS — R040 Epistaxis: Secondary | ICD-10-CM

## 2012-05-20 DIAGNOSIS — F411 Generalized anxiety disorder: Secondary | ICD-10-CM | POA: Insufficient documentation

## 2012-05-20 DIAGNOSIS — F3289 Other specified depressive episodes: Secondary | ICD-10-CM | POA: Insufficient documentation

## 2012-05-20 DIAGNOSIS — I252 Old myocardial infarction: Secondary | ICD-10-CM | POA: Insufficient documentation

## 2012-05-20 DIAGNOSIS — F410 Panic disorder [episodic paroxysmal anxiety] without agoraphobia: Secondary | ICD-10-CM | POA: Insufficient documentation

## 2012-05-20 DIAGNOSIS — Z79899 Other long term (current) drug therapy: Secondary | ICD-10-CM | POA: Insufficient documentation

## 2012-05-20 LAB — CBC WITH DIFFERENTIAL/PLATELET
Basophils Absolute: 0 10*3/uL (ref 0.0–0.1)
Basophils Relative: 1 % (ref 0–1)
Eosinophils Absolute: 0.3 10*3/uL (ref 0.0–0.7)
Eosinophils Relative: 4 % (ref 0–5)
HCT: 37 % — ABNORMAL LOW (ref 39.0–52.0)
Hemoglobin: 12.8 g/dL — ABNORMAL LOW (ref 13.0–17.0)
Lymphocytes Relative: 32 % (ref 12–46)
Lymphs Abs: 2.3 10*3/uL (ref 0.7–4.0)
MCH: 29.5 pg (ref 26.0–34.0)
MCHC: 34.6 g/dL (ref 30.0–36.0)
MCV: 85.3 fL (ref 78.0–100.0)
Monocytes Absolute: 0.6 10*3/uL (ref 0.1–1.0)
Monocytes Relative: 8 % (ref 3–12)
Neutro Abs: 3.9 10*3/uL (ref 1.7–7.7)
Neutrophils Relative %: 55 % (ref 43–77)
Platelets: 197 10*3/uL (ref 150–400)
RBC: 4.34 MIL/uL (ref 4.22–5.81)
RDW: 16.7 % — ABNORMAL HIGH (ref 11.5–15.5)
WBC: 7.1 10*3/uL (ref 4.0–10.5)

## 2012-05-20 MED ORDER — OXYMETAZOLINE HCL 0.05 % NA SOLN
NASAL | Status: AC
  Administered 2012-05-20: 22:00:00
  Filled 2012-05-20: qty 15

## 2012-05-20 NOTE — ED Notes (Signed)
Nose started bleeding around 1 pm per pt. It has been bleeding all day since 1 pm. Have tissue in right side of nose, I can feel it filling up. A blood clot came out once when I pulled the tissue out per pt. Patient is on Plavix.

## 2012-05-20 NOTE — ED Provider Notes (Signed)
History  This chart was scribed for Geoffery Lyons, MD by Ardeen Jourdain. This patient was seen in room APA04/APA04 and the patient's care was started at 2131.  CSN: [id number]  Arrival date & time 05/20/12  2106   First MD Initiated Contact with Patient 05/20/12 2131      Chief Complaint  Patient presents with  . Epistaxis    The history is provided by the patient. No language interpreter was used.    [name] is a 61 y.o. male who presents to the Emergency Department complaining of epistaxis in his right nostril. He states that his nose started bleeding at 1:00 PM and has slowed but not stopped since. He states having a h/o of similar conditions for the past 4 weeks. He reports that he put packing in his nostrils to stop the bleeding but when he pulled it out he noticed a clot. He also reports taking Plavix. He has a h/o MI, depression and anxiety. He is a current everyday smoker but denies alcohol use.    Past Medical History  Diagnosis Date  . Myocardial infarction   . Depression   . Anxiety   . Panic attacks     Past Surgical History  Procedure Date  . Coronary stent placement     No family history on file.  History  Substance Use Topics  . Smoking status: Current Every Day Smoker  . Smokeless tobacco: Not on file  . Alcohol Use: No      Review of Systems  All other systems reviewed and are negative.   A complete 10 system review of systems was obtained and all systems are negative except as noted in the HPI and PMH.    Allergies  Codeine  Home Medications   Current Outpatient Rx  Name  Route  Sig  Dispense  Refill  . ALPRAZOLAM 1 MG PO TABS   Oral   Take 1 mg by mouth 4 (four) times daily.         . ASPIRIN EC 81 MG PO TBEC   Oral   Take 81 mg by mouth every evening.         . ATORVASTATIN CALCIUM 20 MG PO TABS   Oral   Take 20 mg by mouth every morning.         . B-COMPLEX-C PO   Oral   Take 1 tablet by mouth every morning.        Marland Kitchen CLOPIDOGREL BISULFATE 75 MG PO TABS   Oral   Take 75 mg by mouth every morning.         . TRAZODONE HCL 100 MG PO TABS   Oral   Take 400 mg by mouth at bedtime.         . VENLAFAXINE HCL 75 MG PO TABS   Oral   Take 150 mg by mouth 2 (two) times daily.           Triage Vitals: BP 118/70  Pulse 74  Temp 97.4 F (36.3 C) (Oral)  Resp 18  Ht 5\' 10"  (1.778 m)  Wt 145 lb (65.772 kg)  BMI 20.81 kg/m2  SpO2 100%  Physical Exam  Nursing note and vitals reviewed. Constitutional: He is oriented to person, place, and time. He appears well-developed and well-nourished. No distress.  HENT:  Head: Normocephalic and atraumatic.       Right naris appears normal, no blood or active bleeding  Eyes: EOM are normal. Pupils are equal, round,  and reactive to light.  Neck: Normal range of motion. Neck supple. No tracheal deviation present.  Cardiovascular: Normal rate, regular rhythm and normal heart sounds.   Pulmonary/Chest: Effort normal and breath sounds normal. No respiratory distress.  Abdominal: Soft. He exhibits no distension.  Musculoskeletal: Normal range of motion. He exhibits no edema.  Neurological: He is alert and oriented to person, place, and time.  Skin: Skin is warm and dry.  Psychiatric: He has a normal mood and affect. His behavior is normal.    ED Course  Procedures (including critical care time)  DIAGNOSTIC STUDIES: Oxygen Saturation is 100% on room air, normal by my interpretation.    COORDINATION OF CARE:  9:41 PM: Discussed treatment plan which includes a vasoconstrictor and blood work with pt at bedside and pt agreed to plan.  9:50 PM: Medication Order- oxymetazoline (AFRIN) 0.05 % nasal spray     Labs Reviewed - No data to display No results found.   No diagnosis found.    MDM  The patient arrived to the ED with no active bleeding.  I see nothing on exam that requires intervention.  His platelet count is normal and there is no  bleeding while waiting for the labs to come back.  Afrin was used in both nostrils.  He will be discharged, to return prn.      I personally performed the services described in this documentation, which was scribed in my presence. The recorded information has been reviewed and is accurate.      Geoffery Lyons, MD 05/20/12 2224

## 2012-05-20 NOTE — ED Notes (Signed)
Pt has packing in nose at this time. States having to change packing every 3 to 4 hours. Has had nose bleeds in the past but able to get to stop. Pt is on plavix.

## 2012-05-20 NOTE — Discharge Instructions (Signed)
Afrin nasal spray:  Two sprays in each nostril three times daily for the next 2-3 days.   Nosebleed Nosebleeds can be caused by many conditions including trauma, infections, polyps, foreign bodies, dry mucous membranes or climate, medications and air conditioning. Most nosebleeds occur in the front of the nose. It is because of this location that most nosebleeds can be controlled by pinching the nostrils gently and continuously. Do this for at least 10 to 20 minutes. The reason for this long continuous pressure is that you must hold it long enough for the blood to clot. If during that 10 to 20 minute time period, pressure is released, the process may have to be started again. The nosebleed may stop by itself, quit with pressure, need concentrated heating (cautery) or stop with pressure from packing. HOME CARE INSTRUCTIONS   If your nose was packed, try to maintain the pack inside until your caregiver removes it. If a gauze pack was used and it starts to fall out, gently replace or cut the end off. Do not cut if a balloon catheter was used to pack the nose. Otherwise, do not remove unless instructed.  Avoid blowing your nose for 12 hours after treatment. This could dislodge the pack or clot and start bleeding again.  If the bleeding starts again, sit up and bending forward, gently pinch the front half of your nose continuously for 20 minutes.  If bleeding was caused by dry mucous membranes, cover the inside of your nose every morning with a petroleum or antibiotic ointment. Use your little fingertip as an applicator. Do this as needed during dry weather. This will keep the mucous membranes moist and allow them to heal.  Maintain humidity in your home by using less air conditioning or using a humidifier.  Do not use aspirin or medications which make bleeding more likely. Your caregiver can give you recommendations on this.  Resume normal activities as able but try to avoid straining, lifting or  bending at the waist for several days.  If the nosebleeds become recurrent and the cause is unknown, your caregiver may suggest laboratory tests. SEEK IMMEDIATE MEDICAL CARE IF:   Bleeding recurs and cannot be controlled.  There is unusual bleeding from or bruising on other parts of the body.  You have a fever.  Nosebleeds continue.  There is any worsening of the condition which originally brought you in.  You become lightheaded, feel faint, become sweaty or vomit blood. MAKE SURE YOU:   Understand these instructions.  Will watch your condition.  Will get help right away if you are not doing well or get worse. Document Released: 04/09/2005 Document Revised: 09/22/2011 Document Reviewed: 06/01/2009 [facility] Patient Information 2013 [place], [place].

## 2012-05-20 NOTE — ED Notes (Signed)
Pt alert & oriented x4, stable gait. Patient given discharge instructions, paperwork & prescription(s). Patient  instructed to stop at the registration desk to finish any additional paperwork. Patient verbalized understanding. Pt left department w/ no further questions. 

## 2013-03-01 ENCOUNTER — Encounter: Payer: Self-pay | Admitting: Cardiovascular Disease

## 2013-03-01 ENCOUNTER — Telehealth: Payer: Self-pay | Admitting: Cardiovascular Disease

## 2013-03-01 ENCOUNTER — Encounter: Payer: Self-pay | Admitting: *Deleted

## 2013-03-01 DIAGNOSIS — R5381 Other malaise: Secondary | ICD-10-CM

## 2013-03-01 DIAGNOSIS — Z79899 Other long term (current) drug therapy: Secondary | ICD-10-CM

## 2013-03-01 DIAGNOSIS — E782 Mixed hyperlipidemia: Secondary | ICD-10-CM

## 2013-03-01 NOTE — Telephone Encounter (Signed)
Lab order mailed to patient.

## 2013-03-01 NOTE — Telephone Encounter (Signed)
[name] is needing a lab order mailed to him before his appointment on 03/25/13@ 2pm  Thanks

## 2013-03-02 ENCOUNTER — Encounter: Payer: Self-pay | Admitting: Physician Assistant

## 2013-03-02 ENCOUNTER — Ambulatory Visit (INDEPENDENT_AMBULATORY_CARE_PROVIDER_SITE_OTHER): Payer: Medicare PPO | Admitting: Physician Assistant

## 2013-03-02 VITALS — BP 118/84 | HR 68 | Temp 97.5°F | Resp 18 | Ht 68.75 in | Wt 164.0 lb

## 2013-03-02 DIAGNOSIS — M546 Pain in thoracic spine: Secondary | ICD-10-CM

## 2013-03-02 MED ORDER — PREDNISONE 20 MG PO TABS: ORAL_TABLET | ORAL | Status: DC

## 2013-03-02 NOTE — Progress Notes (Signed)
Patient ID: LEGION DISCHER MRN: [id number], DOB: [birth year], 62 y.o. Date of Encounter: 03/02/2013, 1:06 PM    Chief Complaint:  Chief Complaint  Patient presents with  . c/o sprained back     HPI: 62 y.o. year old white, thin  male reports pain across his upper mid back. He says this started about 4-5 weeks ago. He does not recall any injury or trauma. No fall. He cannot think of any specific activity that he did that would have prompted this pain. He has done no lifting twisting or bending. He says other than getting in groceries he has done  none of these types of movements.  He has a history of surgery to the lumbar spine. However he reports no prior history of problems with his thoracic spine. He has been using a heating pad which does provide relief. When he takes a deep breath in and his lungs expand he feels the pain go further around to his flanks on bilateral sides.  Home Meds: See attached medication section for any medications that were entered at today's visit. The computer does not put those onto this list.The following list is a list of meds entered prior to today's visit.   Current Outpatient Prescriptions on File Prior to Visit  Medication Sig Dispense Refill  . ALPRAZolam (XANAX) 1 MG tablet Take 1 mg by mouth 4 (four) times daily.      Marland Kitchen atorvastatin (LIPITOR) 20 MG tablet Take 20 mg by mouth at bedtime.       . B-COMPLEX-C PO Take 1 tablet by mouth every morning.       . traZODone (DESYREL) 100 MG tablet Take 400 mg by mouth at bedtime.      Marland Kitchen venlafaxine (EFFEXOR) 75 MG tablet Take 150 mg by mouth 2 (two) times daily.      Marland Kitchen albuterol (PROVENTIL HFA;VENTOLIN HFA) 108 (90 BASE) MCG/ACT inhaler Inhale 2 puffs into the lungs every 4 (four) hours as needed. For shortness of breath      . clopidogrel (PLAVIX) 75 MG tablet Take 75 mg by mouth every morning.        No current facility-administered medications on file prior to visit.    Allergies:  Allergies  Allergen  Reactions  . Codeine Nausea Only  . Niaspan [Niacin Er]       Review of Systems: See HPI for pertinent ROS. All other ROS negative.    Physical Exam: Blood pressure 118/84, pulse 68, temperature 97.5 F (36.4 C), temperature source Oral, resp. rate 18, height 5' 8.75" (1.746 m), weight 164 lb (74.39 kg)., Body mass index is 24.4 kg/(m^2). General: thin white male. Appears in no acute distress. Lungs: Clear bilaterally to auscultation without wheezes, rales, or rhonchi. Breathing is unlabored. Heart: Regular rhythm. No murmurs, rubs, or gallops. Msk:  He wears a brace around his right leg secondary to scar tissue around the sciatic nerve secondary to prior lumbar surgery. His pain is at approximately T7 level. Even with very light palpation he has severe pain bilaterally at this level. This tenderness does extend to both sides towards the flanks at the same level. However no pain with palpation of the abdomen at this level. Extremities/Skin: Warm and dry. Neuro: Alert and oriented X 3. Moves all extremities spontaneously. CNII-XII grossly in tact. Psych:  Responds to questions appropriately with a normal affect.     ASSESSMENT AND PLAN:  62 y.o. year old male with  1. Thoracic back pain - predniSONE (  DELTASONE) 20 MG tablet; Take 3 daily for 2 days, then 2 daily for 2 days, then 1 daily for 2 days.  Dispense: 12 tablet; Refill: 0 I reviewed his paper chart as well as the computer chart/epic  and see no prior thoracic x-rays there. He will complete the course of prednisone. If his pain does not resolve with completion of the prednisone and he is to call me. I will then obtain x-ray of the thoracic spine to further evaluate. Will avoid any muscle relaxers and narcotics given his current medications.  [address] [place], [place], [facility] 03/02/2013 1:06 PM

## 2013-03-11 LAB — CBC
HCT: 38.9 % — ABNORMAL LOW (ref 39.0–52.0)
Hemoglobin: 13 g/dL (ref 13.0–17.0)
MCH: 29.1 pg (ref 26.0–34.0)
MCHC: 33.4 g/dL (ref 30.0–36.0)
MCV: 87.2 fL (ref 78.0–100.0)
Platelets: 221 10*3/uL (ref 150–400)
RBC: 4.46 MIL/uL (ref 4.22–5.81)
RDW: 16.7 % — ABNORMAL HIGH (ref 11.5–15.5)
WBC: 9.5 10*3/uL (ref 4.0–10.5)

## 2013-03-11 LAB — COMPREHENSIVE METABOLIC PANEL
ALT: 10 U/L (ref 0–53)
AST: 13 U/L (ref 0–37)
Albumin: 3.6 g/dL (ref 3.5–5.2)
Alkaline Phosphatase: 65 U/L (ref 39–117)
BUN: 16 mg/dL (ref 6–23)
CO2: 25 mEq/L (ref 19–32)
Calcium: 8.5 mg/dL (ref 8.4–10.5)
Chloride: 107 mEq/L (ref 96–112)
Creat: 1.01 mg/dL (ref 0.50–1.35)
Glucose, Bld: 119 mg/dL — ABNORMAL HIGH (ref 70–99)
Potassium: 4 mEq/L (ref 3.5–5.3)
Sodium: 138 mEq/L (ref 135–145)
Total Bilirubin: 0.3 mg/dL (ref 0.3–1.2)
Total Protein: 6.3 g/dL (ref 6.0–8.3)

## 2013-03-11 LAB — TSH: TSH: 3.838 u[IU]/mL (ref 0.350–4.500)

## 2013-03-11 LAB — LIPID PANEL
Cholesterol: 146 mg/dL (ref 0–200)
HDL: 36 mg/dL — ABNORMAL LOW (ref >39)
LDL Cholesterol: 47 mg/dL (ref 0–99)
Total CHOL/HDL Ratio: 4.1 Ratio
Triglycerides: 314 mg/dL — ABNORMAL HIGH (ref <150)
VLDL: 63 mg/dL — ABNORMAL HIGH (ref 0–40)

## 2013-03-25 ENCOUNTER — Ambulatory Visit: Payer: Medicare PPO | Admitting: Cardiovascular Disease

## 2013-03-30 ENCOUNTER — Ambulatory Visit: Payer: Medicare PPO | Admitting: Cardiovascular Disease

## 2013-04-06 ENCOUNTER — Ambulatory Visit (INDEPENDENT_AMBULATORY_CARE_PROVIDER_SITE_OTHER): Payer: Medicare PPO | Admitting: Cardiovascular Disease

## 2013-04-06 ENCOUNTER — Encounter: Payer: Self-pay | Admitting: Cardiovascular Disease

## 2013-04-06 VITALS — BP 120/72 | HR 64 | Resp 22 | Ht 70.5 in | Wt 162.2 lb

## 2013-04-06 DIAGNOSIS — E785 Hyperlipidemia, unspecified: Secondary | ICD-10-CM

## 2013-04-06 DIAGNOSIS — I2510 Atherosclerotic heart disease of native coronary artery without angina pectoris: Secondary | ICD-10-CM

## 2013-04-06 NOTE — Patient Instructions (Addendum)
Your physician recommends that you schedule a follow-up appointment in: One Year.  Your physician discussed the hazards of tobacco use. Tobacco use cessation is recommended and techniques and options to help you quit were discussed.

## 2013-04-07 NOTE — Assessment & Plan Note (Signed)
It is quite remarkable that although he is so slender he has a lipid profile that suggests insulin resistance with high triglycerides and low HDL cholesterol. There is a very strong family prevalence of diabetes mellitus. He is encouraged to stay lean and try to exercise as much as his leg problems per minute. Avoid sweets and carbohydrates with a high glycemic index. He should try to eat a diet rich in protein and unsaturated fats.

## 2013-04-07 NOTE — Progress Notes (Signed)
Patient ID: [name], male   DOB: Jul 14, 1951, 62 y.o.   MRN: [id number]      Reason for office visit CAD followup  [name] is now 62 years old and has been roughly 9 years since he received 2 drug-eluting stents to the right coronary artery and left circumflex coronary artery. He is free of angina. His most recent nuclear stress test did not show any ischemia in February 2011. His left ventricular ejection fraction was borderline at 51%. Should be noted that when he initially presented with coronary problems in 2005 his LV EF was 40% with a severely hypokinetic inferior wall.  He has a chronic cough productive of thick but clear sputum and chronic shortness of breath. He has occasional wheezing. He noticed that his breathing improved substantially when he was using bronchodilator inhalers but he cannot afford these medications. He subsequently reduced his smoking since he noticed that he couldn't breathe without inhalers. Nevertheless he has been unable to completely stop smoking. He does have a severe anxiety disorder. He does not have chest pain and denies palpitation or syncope. Denies lower shunting edema orthopnea or paroxysmal atrial dyspnea  Allergies  Allergen Reactions  . Codeine Nausea Only  . Niaspan [Niacin Er]     Current Outpatient Prescriptions  Medication Sig Dispense Refill  . ALPRAZolam (XANAX) 1 MG tablet Take 1 mg by mouth 4 (four) times daily.      Marland Kitchen aspirin 81 MG tablet Take 81 mg by mouth at bedtime.      Marland Kitchen atorvastatin (LIPITOR) 20 MG tablet Take 20 mg by mouth at bedtime.       . B-COMPLEX-C PO Take 1 tablet by mouth every morning.       . mirtazapine (REMERON) 30 MG tablet Take 1 tablet by mouth at bedtime.      . traZODone (DESYREL) 100 MG tablet Take 400 mg by mouth at bedtime.      Marland Kitchen venlafaxine (EFFEXOR) 75 MG tablet Take 150 mg by mouth 2 (two) times daily.      Marland Kitchen albuterol (PROVENTIL HFA;VENTOLIN HFA) 108 (90 BASE) MCG/ACT inhaler Inhale 2 puffs into the  lungs every 4 (four) hours as needed. For shortness of breath       No current facility-administered medications for this visit.    Past Medical History  Diagnosis Date  . Myocardial infarction   . Depression   . Anxiety   . Panic attacks   . CAD (coronary artery disease)   . COPD (chronic obstructive pulmonary disease)     Past Surgical History  Procedure Laterality Date  . Coronary stent placement  2005    RCA & CX  . Back surgery  12/24/2000    L5,S1  . Inguinal hernia repair  12/1978    right side  . Nm myocar perf wall motion  09/07/2009    No ischemia; EF 51%    Family History  Problem Relation Age of Onset  . Heart attack Father   . Heart failure Mother   . Diabetes Brother   . Heart failure Sister     History   Social History  . Marital Status: Single    Spouse Name: N/A    Number of Children: N/A  . Years of Education: N/A   Occupational History  . Not on file.   Social History Main Topics  . Smoking status: Current Every Day Smoker -- 5.00 packs/day    Types: Cigarettes  . Smokeless tobacco: Not on  file  . Alcohol Use: No  . Drug Use: No  . Sexual Activity: Not on file   Other Topics Concern  . Not on file   Social History Narrative  . No narrative on file    Review of systems: The patient specifically denies any chest pain at rest or with exertion, dyspnea at rest, orthopnea, paroxysmal nocturnal dyspnea, syncope, palpitations, focal neurological deficits, intermittent claudication, lower extremity edema, unexplained weight gain.  The patient also denies abdominal pain, nausea, vomiting, dysphagia, diarrhea, constipation, polyuria, polydipsia, dysuria, hematuria, frequency, urgency, abnormal bleeding or bruising, fever, chills, unexpected weight changes, mood swings, change in skin or hair texture, change in voice quality, auditory or visual problems, allergic reactions or rashes, new musculoskeletal complaints other than usual "aches and  pains".   PHYSICAL EXAM BP 120/72  Pulse 64  Resp 22  Ht 5' 10.5" (1.791 m)  Wt 162 lb 3.2 oz (73.573 kg)  BMI 22.94 kg/m2  General: Alert, oriented x3, no distress Head: no evidence of trauma, PERRL, EOMI, no exophtalmos or lid lag, no myxedema, no xanthelasma; normal ears, nose and oropharynx Neck: normal jugular venous pulsations and no hepatojugular reflux; brisk carotid pulses without delay and no carotid bruits Chest: clear to auscultation, no signs of consolidation by percussion or palpation, normal fremitus, symmetrical and full respiratory excursions Cardiovascular: normal position and quality of the apical impulse, regular rhythm, normal first and second heart sounds, no murmurs, rubs or gallops Abdomen: no tenderness or distention, no masses by palpation, no abnormal pulsatility or arterial bruits, normal bowel sounds, no hepatosplenomegaly Extremities: no clubbing, cyanosis or edema; 2+ radial, ulnar and brachial pulses bilaterally; 2+ right femoral, posterior tibial and dorsalis pedis pulses; 2+ left femoral, posterior tibial and dorsalis pedis pulses; no subclavian or femoral bruits. He wears an orthosis on his right knee Neurological: grossly nonfocal   EKG:  sinus rhythm, deep but relatively sharp Q waves in the II, III, and F cannot exclude old inferior MI  Lipid Panel     Component Value Date/Time   CHOL 146 03/11/2013 0806   TRIG 314* 03/11/2013 0806   HDL 36* 03/11/2013 0806   CHOLHDL 4.1 03/11/2013 0806   VLDL 63* 03/11/2013 0806   LDLCALC 47 03/11/2013 0806    BMET    Component Value Date/Time   NA 138 03/11/2013 0806   K 4.0 03/11/2013 0806   CL 107 03/11/2013 0806   CO2 25 03/11/2013 0806   GLUCOSE 119* 03/11/2013 0806   BUN 16 03/11/2013 0806   CREATININE 1.01 03/11/2013 0806   CREATININE 0.9 08/16/2010 1202   CALCIUM 8.5 03/11/2013 0806   GFRNONAA >60 10/18/2009 1328   GFRAA  Value: >60        The eGFR has been calculated using the MDRD equation. This  calculation has not been validated in all clinical situations. eGFR's persistently <60 mL/min signify possible Chronic Kidney Disease. 10/18/2009 1328     ASSESSMENT AND PLAN CORONARY ARTERY DISEASE He does not have any symptoms of active coronary insufficiency. He asked about the fact that he has been taken off the Plavix and I told him that this medication has a measurable but very small benefit in secondary prevention so long after any revascularization procedure or acute coronary event. He is lean takes an effective statin has normal blood pressure and does not have diabetes mellitus. The only factor that has not been fully medicated is smoking cigarettes. He states that he is smoking a lot less. He  has noticed that if he doesn't smoke he does much better without inhalers. He is unable to afford inhalers. I tried to coax him into decision to quit smoking based on the fact that he would avoid spending money on both cigarettes and inhalers. We discussed smoking cessation quite a bit. I promised him though that would not charge him for this discussion since I believe it is absolutely absurd that his insurance company would essentially penalize him for what is causing preventive therapy.  Orders Placed This Encounter  Procedures  . EKG 12-Lead   No orders of the defined types were placed in this encounter.    Junious Silk, MD, [facility] [phone] office [phone] pager

## 2013-04-07 NOTE — Assessment & Plan Note (Signed)
He does not have any symptoms of active coronary insufficiency. He asked about the fact that he has been taken off the Plavix and I told him that this medication has a measurable but very small benefit in secondary prevention so long after any revascularization procedure or acute coronary event. He is lean takes an effective statin has normal blood pressure and does not have diabetes mellitus. The only factor that has not been fully medicated is smoking cigarettes. He states that he is smoking a lot less. He has noticed that if he doesn't smoke he does much better without inhalers. He is unable to afford inhalers. I tried to coax him into decision to quit smoking based on the fact that he would avoid spending money on both cigarettes and inhalers. We discussed smoking cessation quite a bit. I promised him though that would not charge him for this discussion since I believe it is absolutely absurd that his insurance company would essentially penalize him for what is causing preventive therapy.

## 2013-06-06 ENCOUNTER — Other Ambulatory Visit: Payer: Self-pay | Admitting: *Deleted

## 2013-06-06 MED ORDER — ATORVASTATIN CALCIUM 20 MG PO TABS: 20.0000 mg | ORAL_TABLET | Freq: Every day | ORAL | Status: DC

## 2013-06-06 NOTE — Telephone Encounter (Signed)
Rx was sent to pharmacy electronically. 

## 2013-07-01 ENCOUNTER — Encounter: Payer: Self-pay | Admitting: Physician Assistant

## 2013-07-01 NOTE — Telephone Encounter (Signed)
Pt had missed call and is returning call Call back number is [phone]

## 2013-07-01 NOTE — Telephone Encounter (Signed)
I see know where in chart where we called him

## 2013-07-04 NOTE — Telephone Encounter (Signed)
This encounter was created in error - please disregard.

## 2013-10-13 ENCOUNTER — Encounter: Payer: Self-pay | Admitting: Physician Assistant

## 2013-10-13 ENCOUNTER — Ambulatory Visit (INDEPENDENT_AMBULATORY_CARE_PROVIDER_SITE_OTHER): Payer: Medicare PPO | Admitting: Physician Assistant

## 2013-10-13 VITALS — BP 114/70 | HR 68 | Temp 97.6°F | Resp 18 | Ht 68.25 in | Wt 163.0 lb

## 2013-10-13 DIAGNOSIS — I2510 Atherosclerotic heart disease of native coronary artery without angina pectoris: Secondary | ICD-10-CM

## 2013-10-13 DIAGNOSIS — F329 Major depressive disorder, single episode, unspecified: Secondary | ICD-10-CM

## 2013-10-13 DIAGNOSIS — R0781 Pleurodynia: Secondary | ICD-10-CM

## 2013-10-13 DIAGNOSIS — R071 Chest pain on breathing: Secondary | ICD-10-CM

## 2013-10-13 DIAGNOSIS — I252 Old myocardial infarction: Secondary | ICD-10-CM

## 2013-10-13 DIAGNOSIS — F3289 Other specified depressive episodes: Secondary | ICD-10-CM

## 2013-10-13 DIAGNOSIS — F411 Generalized anxiety disorder: Secondary | ICD-10-CM

## 2013-10-13 DIAGNOSIS — J449 Chronic obstructive pulmonary disease, unspecified: Secondary | ICD-10-CM

## 2013-10-13 DIAGNOSIS — E785 Hyperlipidemia, unspecified: Secondary | ICD-10-CM

## 2013-10-13 DIAGNOSIS — F17200 Nicotine dependence, unspecified, uncomplicated: Secondary | ICD-10-CM | POA: Insufficient documentation

## 2013-10-14 NOTE — Progress Notes (Signed)
Patient ID: [name] MRN: [id number], DOB: Nov 28, 1950, 63 y.o. Date of Encounter: @DATE @  Chief Complaint:  Chief Complaint  Patient presents with  . discuss poss diabetic    c/o rib pain, diff taking big breath    HPI: 63 y.o. year old white male  presents for followup visit.  Brings a copy of some labs that were drawn 03/11/13. Says that these were drawn by another provider and they told him to follow up with his PCP and he is just now following up.!!!! See A/P end of note. I will include these results there.  He states that when his labs were drawn he was told that his sugar and triglycerides were high. Says that since then he has decreased pasta potatoes and starches as he was instructed to do so. He also states that he bit his sister and his brother both have diabetes and he most avoid developing not if possible so he is making diet changes.  Today he has one other complaint. Says that when he takes a deep breath in and his lungs expand the he feels some discomfort going around his "rib cage." Says it seems to go around from his chest around to his side on the flank areas.  He does continue to smoke about one pack per day.  I see in Epic and he does have known CAD. He is on Lipitor. He is taking this as directed with no adverse effects including no myalgias.   Past Medical History  Diagnosis Date  . Myocardial infarction   . Depression   . Anxiety   . Panic attacks   . CAD (coronary artery disease)   . COPD (chronic obstructive pulmonary disease)   . Smoker      Home Meds: See attached medication section for current medication list. Any medications entered into computer today will not appear on this note's list. The medications listed below were entered prior to today. Current Outpatient Prescriptions on File Prior to Visit  Medication Sig Dispense Refill  . ALPRAZolam (XANAX) 1 MG tablet Take 1 mg by mouth 4 (four) times daily.      Marland Kitchen aspirin 81 MG tablet Take  81 mg by mouth at bedtime.      Marland Kitchen atorvastatin (LIPITOR) 20 MG tablet Take 1 tablet (20 mg total) by mouth at bedtime.  90 tablet  2  . B-COMPLEX-C PO Take 1 tablet by mouth every morning.       . traZODone (DESYREL) 100 MG tablet Take 400 mg by mouth at bedtime.      Marland Kitchen venlafaxine (EFFEXOR) 75 MG tablet Take 75 mg by mouth 2 (two) times daily with a meal.       . albuterol (PROVENTIL HFA;VENTOLIN HFA) 108 (90 BASE) MCG/ACT inhaler Inhale 2 puffs into the lungs every 4 (four) hours as needed. For shortness of breath      . mirtazapine (REMERON) 30 MG tablet Take 1 tablet by mouth at bedtime.       No current facility-administered medications on file prior to visit.    Allergies:  Allergies  Allergen Reactions  . Codeine Nausea Only  . Niaspan [Niacin Er]     History   Social History  . Marital Status: Single    Spouse Name: N/A    Number of Children: N/A  . Years of Education: N/A   Occupational History  . Not on file.   Social History Main Topics  . Smoking status: Current Every  Day Smoker -- 1.00 packs/day    Types: Cigarettes  . Smokeless tobacco: Never Used  . Alcohol Use: No  . Drug Use: No  . Sexual Activity: Not on file   Other Topics Concern  . Not on file   Social History Narrative  . No narrative on file    Family History  Problem Relation Age of Onset  . Heart attack Father   . Heart failure Mother   . Diabetes Brother   . Heart failure Sister      Review of Systems:  See HPI for pertinent ROS. All other ROS negative.    Physical Exam: Blood pressure 114/70, pulse 68, temperature 97.6 F (36.4 C), temperature source Oral, resp. rate 18, height 5' 8.25" (1.734 m), weight 163 lb (73.936 kg)., Body mass index is 24.59 kg/(m^2). General: WM. Smells of smoke. Appears in no acute distress. Neck: Supple. No thyromegaly. No lymphadenopathy. Lungs: Distant, decreased breath sounds throughout but clear. I hear no wheezes rales or rhonchi. Heart: RRR with  S1 S2. No murmurs, rubs, or gallops. Abdomen: Soft, non-tender, non-distended with normoactive bowel sounds. No hepatomegaly. No rebound/guarding. No obvious abdominal masses. Musculoskeletal:  Strength and tone normal for age. Extremities/Skin: Warm and dry.  No edema. Neuro: Alert and oriented X 3. Moves all extremities spontaneously. Gait is normal. CNII-XII grossly in tact. Psych:  Responds to questions appropriately with a normal affect.     ASSESSMENT AND PLAN:  63 y.o. year old male with  1. [place] Per Cardiology.  2. MYOCARDIAL INFARCTION, HX OF  3. HYPERLIPIDEMIA On Lipitor  4. Smoker - DG Chest 2 View; Future  5. COPD  6. DEPRESSION Today our staff did perform depression screening questionnaire. However patient does see psychiatry on a routine basis and is taking psychiatry medicines as directed.  7. ANXIETY Per Psychiatry  8. Pleuritic chest pain - DG Chest 2 View; Future  PATIENT BROUGHT IN COPY OF LABS DATED 03/11/2013 WHICH INCLUDES: CMET--NORMAL EXCEPT GLUCOSE 119 FLP--TRIG---314  HDL--36  LDL--47 CBC--NML TSH---NML  We'll obtain chest x-ray to evaluate the pain he is feeling. He has Had no routine followup here or. He is agreeable to schedule a complete physical exam in the next one to 2 weeks and will come fasting to that appointment.   Marin Olp [place], [place], [facility] 10/14/2013 4:22 PM

## 2013-10-18 ENCOUNTER — Ambulatory Visit
Admission: RE | Admit: 2013-10-18 | Discharge: 2013-10-18 | Disposition: A | Discharge status: Home / Self Care | Payer: Medicare PPO | Source: Ambulatory Visit | Attending: Physician Assistant | Admitting: Physician Assistant

## 2013-10-18 DIAGNOSIS — F17200 Nicotine dependence, unspecified, uncomplicated: Secondary | ICD-10-CM

## 2013-10-18 DIAGNOSIS — R0781 Pleurodynia: Secondary | ICD-10-CM

## 2013-10-20 ENCOUNTER — Encounter: Payer: Self-pay | Admitting: Physician Assistant

## 2013-10-20 ENCOUNTER — Ambulatory Visit (INDEPENDENT_AMBULATORY_CARE_PROVIDER_SITE_OTHER): Payer: Medicare PPO | Admitting: Physician Assistant

## 2013-10-20 VITALS — BP 116/72 | HR 72 | Temp 97.5°F | Resp 18 | Ht 69.0 in | Wt 164.0 lb

## 2013-10-20 DIAGNOSIS — R739 Hyperglycemia, unspecified: Secondary | ICD-10-CM | POA: Insufficient documentation

## 2013-10-20 DIAGNOSIS — I252 Old myocardial infarction: Secondary | ICD-10-CM

## 2013-10-20 DIAGNOSIS — Z1211 Encounter for screening for malignant neoplasm of colon: Secondary | ICD-10-CM

## 2013-10-20 DIAGNOSIS — F329 Major depressive disorder, single episode, unspecified: Secondary | ICD-10-CM

## 2013-10-20 DIAGNOSIS — Z1212 Encounter for screening for malignant neoplasm of rectum: Secondary | ICD-10-CM

## 2013-10-20 DIAGNOSIS — E785 Hyperlipidemia, unspecified: Secondary | ICD-10-CM

## 2013-10-20 DIAGNOSIS — I2510 Atherosclerotic heart disease of native coronary artery without angina pectoris: Secondary | ICD-10-CM

## 2013-10-20 DIAGNOSIS — F17200 Nicotine dependence, unspecified, uncomplicated: Secondary | ICD-10-CM

## 2013-10-20 DIAGNOSIS — F411 Generalized anxiety disorder: Secondary | ICD-10-CM

## 2013-10-20 DIAGNOSIS — F3289 Other specified depressive episodes: Secondary | ICD-10-CM

## 2013-10-20 DIAGNOSIS — Z0000 Encounter for general adult medical examination without abnormal findings: Secondary | ICD-10-CM

## 2013-10-20 DIAGNOSIS — R7309 Other abnormal glucose: Secondary | ICD-10-CM

## 2013-10-20 DIAGNOSIS — J449 Chronic obstructive pulmonary disease, unspecified: Secondary | ICD-10-CM

## 2013-10-20 LAB — CBC WITH DIFFERENTIAL/PLATELET
Basophils Absolute: 0.1 10*3/uL (ref 0.0–0.1)
Basophils Relative: 1 % (ref 0–1)
Eosinophils Absolute: 0.2 10*3/uL (ref 0.0–0.7)
Eosinophils Relative: 2 % (ref 0–5)
HCT: 40.3 % (ref 39.0–52.0)
Hemoglobin: 13.9 g/dL (ref 13.0–17.0)
Lymphocytes Relative: 20 % (ref 12–46)
Lymphs Abs: 2 10*3/uL (ref 0.7–4.0)
MCH: 29.8 pg (ref 26.0–34.0)
MCHC: 34.5 g/dL (ref 30.0–36.0)
MCV: 86.5 fL (ref 78.0–100.0)
Monocytes Absolute: 0.5 10*3/uL (ref 0.1–1.0)
Monocytes Relative: 5 % (ref 3–12)
Neutro Abs: 7.1 10*3/uL (ref 1.7–7.7)
Neutrophils Relative %: 72 % (ref 43–77)
Platelets: 216 10*3/uL (ref 150–400)
RBC: 4.66 MIL/uL (ref 4.22–5.81)
RDW: 16.1 % — ABNORMAL HIGH (ref 11.5–15.5)
WBC: 9.8 10*3/uL (ref 4.0–10.5)
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
## 2013-10-20 NOTE — Addendum Note (Signed)
Addended by: Olena Mater on: 10/20/2013 02:29 PM   Modules accepted: Orders

## 2013-10-21 ENCOUNTER — Telehealth: Payer: Self-pay | Admitting: Family Medicine

## 2013-10-21 ENCOUNTER — Telehealth: Payer: Self-pay | Admitting: Cardiovascular Disease

## 2013-10-21 LAB — LIPID PANEL
Cholesterol: 124 mg/dL (ref 0–200)
HDL: 37 mg/dL — ABNORMAL LOW (ref >39)
LDL Cholesterol: 72 mg/dL (ref 0–99)
Total CHOL/HDL Ratio: 3.4 Ratio
Triglycerides: 74 mg/dL (ref <150)
VLDL: 15 mg/dL (ref 0–40)

## 2013-10-21 LAB — COMPLETE METABOLIC PANEL WITH GFR
ALT: 11 U/L (ref 0–53)
AST: 17 U/L (ref 0–37)
Albumin: 3.9 g/dL (ref 3.5–5.2)
Alkaline Phosphatase: 66 U/L (ref 39–117)
BUN: 13 mg/dL (ref 6–23)
CO2: 25 mEq/L (ref 19–32)
Calcium: 9 mg/dL (ref 8.4–10.5)
Chloride: 104 mEq/L (ref 96–112)
Creat: 0.92 mg/dL (ref 0.50–1.35)
GFR, Est African American: >89 mL/min
GFR, Est Non African American: 89 mL/min
Glucose, Bld: 76 mg/dL (ref 70–99)
Potassium: 4.2 mEq/L (ref 3.5–5.3)
Sodium: 137 mEq/L (ref 135–145)
Total Bilirubin: 0.6 mg/dL (ref 0.2–1.2)
Total Protein: 6.7 g/dL (ref 6.0–8.3)

## 2013-10-21 LAB — VITAMIN D 25 HYDROXY (VIT D DEFICIENCY, FRACTURES): Vit D, 25-Hydroxy: 26 ng/mL — ABNORMAL LOW (ref 30–89)

## 2013-10-21 LAB — HEMOGLOBIN A1C
Hgb A1c MFr Bld: 5.6 % (ref <5.7)
Mean Plasma Glucose: 114 mg/dL (ref <117)

## 2013-10-21 LAB — PSA, MEDICARE: PSA: 0.77 ng/mL (ref <=4.00)

## 2013-10-21 NOTE — Telephone Encounter (Signed)
I spoke to patient.  He was very pleasant.  Asked him who he though he should see.  He did not know, as I told him was our dilemma as well.  CXR was normal and is first line diagnostic tool given his complaint.  As he seemed unhappy with result from the PA, I offered him an appointment with one of our MD's for a second opinion.  He said he thought he was not suppose to change doctors.  I told him this was only for second opinion and he can continue with his general medical care with PA.  He was fine with this.  Appt made for next week.

## 2013-10-21 NOTE — Telephone Encounter (Signed)
Message copied by Olena Mater on Fri Oct 21, 2013 12:05 PM ------      Message from: Devoria Glassing      Created: Fri Oct 21, 2013 10:57 AM       Patient is saying that [place] diagnosed what he did not have, but did not explain the reasoning for his rib pain would like a referral for his rib pain or he will need to find another doctor he said  ------

## 2013-10-21 NOTE — Telephone Encounter (Signed)
Wanting to know if Dr.Coritoru can prescribe him something for his elevated Triglycerides .Marland Kitchen Please call   Thanks

## 2013-10-21 NOTE — Telephone Encounter (Signed)
Told patient his triglycerides done yesterday were 74 done through [facility]. (He has not been notified yet).  States he has cut out pastas and potatoes from his diet.  Told him to keep up the good work.  Voiced understanding he does not need anything for his triglycerides.

## 2013-10-24 NOTE — Telephone Encounter (Signed)
At his office visit patient reported that when he takes a deep breath in and his lungs expand, he feels some discomfort around his rib cage. He is a smoker. I obtained a chest x-ray which showed no abnormalities to explain these symptoms. Symptoms are most likely secondary to inflammation in his lungs secondary to his smoking. Recommend smoking cessation.

## 2013-10-24 NOTE — Telephone Encounter (Signed)
He has appt to come see Dr Buelah Manis this week for his rib pain.

## 2013-10-24 NOTE — Telephone Encounter (Signed)
Wants 2nd opionion but d/t insurance can not get here at [facility], wants to know if you can send him to another doctor for a Second opinion for his rib pain. Please advise!

## 2013-10-25 ENCOUNTER — Ambulatory Visit: Payer: Self-pay | Admitting: Family Medicine

## 2013-12-19 ENCOUNTER — Telehealth: Payer: Self-pay | Admitting: Family Medicine

## 2013-12-19 NOTE — Telephone Encounter (Signed)
Pt states never heard from lab results from April.  Reviewed them with patient as per provider recommendations

## 2013-12-19 NOTE — Telephone Encounter (Signed)
Message copied by Olena Mater on Mon Dec 19, 2013 12:57 PM ------      Message from: Devoria Glassing      Created: Mon Dec 19, 2013  8:12 AM       Patient is calling for some test results       [phone] ------

## 2014-01-19 ENCOUNTER — Other Ambulatory Visit: Payer: Self-pay | Admitting: Cardiovascular Disease

## 2014-02-23 ENCOUNTER — Telehealth: Payer: Self-pay | Admitting: Cardiovascular Disease

## 2014-02-23 NOTE — Telephone Encounter (Signed)
Closed encounter °

## 2014-02-26 ENCOUNTER — Encounter (HOSPITAL_COMMUNITY): Payer: Self-pay | Admitting: Emergency Medicine

## 2014-02-26 ENCOUNTER — Emergency Department (HOSPITAL_COMMUNITY)
Admission: EM | Admit: 2014-02-26 | Discharge: 2014-02-26 | Disposition: A | Discharge status: Home / Self Care | Payer: Medicare PPO | Attending: Emergency Medicine | Admitting: Emergency Medicine

## 2014-02-26 DIAGNOSIS — Z792 Long term (current) use of antibiotics: Secondary | ICD-10-CM | POA: Diagnosis not present

## 2014-02-26 DIAGNOSIS — F410 Panic disorder [episodic paroxysmal anxiety] without agoraphobia: Secondary | ICD-10-CM | POA: Diagnosis not present

## 2014-02-26 DIAGNOSIS — I252 Old myocardial infarction: Secondary | ICD-10-CM | POA: Insufficient documentation

## 2014-02-26 DIAGNOSIS — Z9861 Coronary angioplasty status: Secondary | ICD-10-CM | POA: Diagnosis not present

## 2014-02-26 DIAGNOSIS — J449 Chronic obstructive pulmonary disease, unspecified: Secondary | ICD-10-CM | POA: Insufficient documentation

## 2014-02-26 DIAGNOSIS — L03211 Cellulitis of face: Principal | ICD-10-CM

## 2014-02-26 DIAGNOSIS — L0201 Cutaneous abscess of face: Secondary | ICD-10-CM | POA: Diagnosis present

## 2014-02-26 DIAGNOSIS — Z7982 Long term (current) use of aspirin: Secondary | ICD-10-CM | POA: Insufficient documentation

## 2014-02-26 DIAGNOSIS — I2510 Atherosclerotic heart disease of native coronary artery without angina pectoris: Secondary | ICD-10-CM | POA: Insufficient documentation

## 2014-02-26 DIAGNOSIS — F3289 Other specified depressive episodes: Secondary | ICD-10-CM | POA: Diagnosis not present

## 2014-02-26 DIAGNOSIS — F17200 Nicotine dependence, unspecified, uncomplicated: Secondary | ICD-10-CM | POA: Diagnosis not present

## 2014-02-26 DIAGNOSIS — Z79899 Other long term (current) drug therapy: Secondary | ICD-10-CM | POA: Diagnosis not present

## 2014-02-26 DIAGNOSIS — F329 Major depressive disorder, single episode, unspecified: Secondary | ICD-10-CM | POA: Diagnosis not present

## 2014-02-26 DIAGNOSIS — J4489 Other specified chronic obstructive pulmonary disease: Secondary | ICD-10-CM | POA: Insufficient documentation

## 2014-02-26 MED ORDER — BUPIVACAINE HCL (PF) 0.5 % IJ SOLN
10.0000 mL | Freq: Once | INTRAMUSCULAR | Status: AC
  Administered 2014-02-26: 17:00:00
  Filled 2014-02-26: qty 30

## 2014-02-26 MED ORDER — IBUPROFEN 400 MG PO TABS
600.0000 mg | ORAL_TABLET | Freq: Once | ORAL | Status: AC
  Administered 2014-02-26: 600 mg via ORAL
  Filled 2014-02-26: qty 2

## 2014-02-26 MED ORDER — SULFAMETHOXAZOLE-TMP DS 800-160 MG PO TABS
1.0000 | ORAL_TABLET | Freq: Once | ORAL | Status: AC
Start: 2014-02-26 — End: 2014-02-26
  Administered 2014-02-26: 1 via ORAL
  Filled 2014-02-26: qty 1

## 2014-02-26 MED ORDER — SULFAMETHOXAZOLE-TMP DS 800-160 MG PO TABS: 1.0000 | ORAL_TABLET | Freq: Two times a day (BID) | ORAL | Status: DC

## 2014-02-26 MED ORDER — OXYCODONE-ACETAMINOPHEN 5-325 MG PO TABS: 1.0000 | ORAL_TABLET | ORAL | Status: DC | PRN

## 2014-02-26 NOTE — ED Notes (Signed)
Patient reports abscess to face since Friday that has increased in size. Reports drainage and swelling has increased over the last couple of days.

## 2014-02-26 NOTE — Discharge Instructions (Signed)

## 2014-03-01 ENCOUNTER — Ambulatory Visit (INDEPENDENT_AMBULATORY_CARE_PROVIDER_SITE_OTHER): Payer: Medicare PPO | Admitting: Physician Assistant

## 2014-03-01 ENCOUNTER — Encounter: Payer: Self-pay | Admitting: Physician Assistant

## 2014-03-01 VITALS — BP 118/60 | HR 64 | Temp 97.3°F | Resp 14 | Ht 69.0 in | Wt 163.0 lb

## 2014-03-01 DIAGNOSIS — L0201 Cutaneous abscess of face: Secondary | ICD-10-CM

## 2014-03-01 DIAGNOSIS — L03211 Cellulitis of face: Principal | ICD-10-CM

## 2014-03-01 NOTE — Progress Notes (Signed)
    Patient ID: [name] MRN: [id number], DOB: 1951/01/16, 63 y.o. Date of Encounter: 03/01/2014, 8:21 AM    Chief Complaint:  Chief Complaint  Patient presents with  . Hospitalization Follow-up    Abscess on left check      HPI: 63 y.o. year old white male here to followup after recent ER visit. ER provider note is not in [place]. Therefore history is obtained from patient. He reports that he went to the ER on Sunday 02/25/14. Had an abscess on the left cheek. I and D. was performed and he was placed on antibiotic Bactrim twice a day. He states that he is taking the Bactrim as directed with no adverse effects. He states that the site is improving.     Home Meds:   Outpatient Prescriptions Prior to Visit  Medication Sig Dispense Refill  . ALPRAZolam (XANAX) 1 MG tablet Take 1 mg by mouth 4 (four) times daily.      Marland Kitchen aspirin EC 81 MG tablet Take 81 mg by mouth at bedtime.      Marland Kitchen atorvastatin (LIPITOR) 20 MG tablet Take 20 mg by mouth at bedtime.      . Calcium Carbonate 1500 MG TABS Take 1,500 mg by mouth daily.      . cholecalciferol (VITAMIN D) 1000 UNITS tablet Take 1,000 Units by mouth daily.      Marland Kitchen oxyCODONE-acetaminophen (PERCOCET/ROXICET) 5-325 MG per tablet Take 1-2 tablets by mouth every 4 (four) hours as needed for severe pain.  8 tablet  0  . sulfamethoxazole-trimethoprim (BACTRIM DS) 800-160 MG per tablet Take 1 tablet by mouth 2 (two) times daily.  10 tablet  0  . traZODone (DESYREL) 100 MG tablet Take 400 mg by mouth at bedtime.      Marland Kitchen venlafaxine (EFFEXOR) 75 MG tablet Take 150 mg by mouth 2 (two) times daily.        No facility-administered medications prior to visit.    Allergies:  Allergies  Allergen Reactions  . Codeine Nausea Only  . Niaspan [Niacin Er]       Review of Systems: See HPI for pertinent ROS. All other ROS negative.    Physical Exam: Blood pressure 118/60, pulse 64, temperature 97.3 F (36.3 C), temperature source Oral, resp. rate 14,  height 5\' 9"  (1.753 m), weight 163 lb (73.936 kg)., Body mass index is 24.06 kg/(m^2). General:  Appears in no acute distress. Neck: Supple. No thyromegaly. No lymphadenopathy. Lungs: Clear bilaterally to auscultation without wheezes, rales, or rhonchi. Breathing is unlabored. Heart: Regular rhythm. No murmurs, rubs, or gallops. Msk:  Strength and tone normal for age. Skin: Left Cheek: He has a Band-Aid covering the site. The site is inspected and does appear that it is healing well. There is very minimal erythema present and no abscess present now. Neuro: Alert and oriented X 3. Moves all extremities spontaneously. Gait is normal. CNII-XII grossly in tact. Psych:  Responds to questions appropriately with a normal affect.     ASSESSMENT AND PLAN:  63 y.o. year old male with  1. Cellulitis and abscess of face I checked lab section of the computer there is no culture report. He is to complete the Bactrim as directed. If the site worsens or if the site is not resolved at completion of Bactrim then he needs to followup with [place] immediately.   Signed, [address] [place], [place], [facility] 03/01/2014 8:21 AM

## 2014-03-06 ENCOUNTER — Telehealth: Payer: Self-pay | Admitting: *Deleted

## 2014-03-06 NOTE — Telephone Encounter (Signed)
Received call from patient.   Reports that he was seen in ER and in office for F/U of facial abscess. Reports that area was lanced in ER and he was given Bactrim DS BID x5 days.   Per PA notes, if area had not resolved by completion of ABTx, he is to F/U with [facility].   Reports that he has completed ABTx, but states that area remains open with serosanguineous drainage noted.   Requested further ABTx for area. Appointment scheduled with PA on 03/08/2014.  MD please advise.

## 2014-03-07 NOTE — Telephone Encounter (Signed)
Call placed to patient. LMTRC.  

## 2014-03-07 NOTE — Telephone Encounter (Signed)
Call placed to patient and patient made aware.   Appointment cancelled per patient request.

## 2014-03-07 NOTE — ED Provider Notes (Signed)
CSN: [id number]     Arrival date & time 02/26/14  1555 History   First MD Initiated Contact with Patient 02/26/14 1613     Chief Complaint  Patient presents with  . Abscess     (Consider location/radiation/quality/duration/timing/severity/associated sxs/prior Treatment) HPI  62yM with painful facial lesion consistent with a small abscess. Started like a "small pimple" and has increased in size and becoming more painful in last few days. Small amount of drainage. Has been squeezing it. No fever or chills. No n/v. No other acute complaints.   Past Medical History  Diagnosis Date  . Myocardial infarction   . Depression   . Anxiety   . Panic attacks   . CAD (coronary artery disease)   . COPD (chronic obstructive pulmonary disease)   . Smoker    Past Surgical History  Procedure Laterality Date  . Coronary stent placement  2005    RCA & CX  . Back surgery  12/24/2000    L5,S1  . Inguinal hernia repair  12/1978    right side  . Nm myocar perf wall motion  09/07/2009    No ischemia; EF 51%  . Hernia repair Right 1980's  . Spine surgery  2002    L5-S1  . Shoulder surgery Left 08/2010   Family History  Problem Relation Age of Onset  . Heart attack Father   . Kidney disease Father     renal failure  . Heart failure Mother   . Heart attack Mother   . Cancer Brother   . Diabetes Brother   . Alcohol abuse Brother   . Diabetes Sister    History  Substance Use Topics  . Smoking status: Current Every Day Smoker -- 1.00 packs/day    Types: Cigarettes  . Smokeless tobacco: Never Used  . Alcohol Use: No    Review of Systems  All systems reviewed and negative, other than as noted in HPI.   Allergies  Codeine and Niaspan  Home Medications   Prior to Admission medications   Medication Sig Start Date End Date Taking? Authorizing Provider  ALPRAZolam Duanne Moron) 1 MG tablet Take 1 mg by mouth 4 (four) times daily.   Yes Historical Provider, MD  aspirin EC 81 MG tablet Take 81  mg by mouth at bedtime.   Yes Historical Provider, MD  atorvastatin (LIPITOR) 20 MG tablet Take 20 mg by mouth at bedtime.   Yes Historical Provider, MD  Calcium Carbonate 1500 MG TABS Take 1,500 mg by mouth daily.   Yes Historical Provider, MD  cholecalciferol (VITAMIN D) 1000 UNITS tablet Take 1,000 Units by mouth daily.   Yes Historical Provider, MD  traZODone (DESYREL) 100 MG tablet Take 400 mg by mouth at bedtime. 12/28/13  Yes Historical Provider, MD  venlafaxine (EFFEXOR) 75 MG tablet Take 150 mg by mouth 2 (two) times daily.    Yes Historical Provider, MD  oxyCODONE-acetaminophen (PERCOCET/ROXICET) 5-325 MG per tablet Take 1-2 tablets by mouth every 4 (four) hours as needed for severe pain. 02/26/14   Virgel Manifold, MD  sulfamethoxazole-trimethoprim (BACTRIM DS) 800-160 MG per tablet Take 1 tablet by mouth 2 (two) times daily. 02/26/14   Virgel Manifold, MD   BP 132/75  Pulse 69  Temp(Src) 97.7 F (36.5 C) (Oral)  Resp 18  Ht 5\' 9"  (1.753 m)  Wt 163 lb (73.936 kg)  BMI 24.06 kg/m2  SpO2 97% Physical Exam  Nursing note and vitals reviewed. Constitutional: He appears well-developed and well-nourished. No distress.  HENT:  Head:    Tender lesion coming to point consistent with abscess to L cheek. Minimal surrounding induration/erythema.   Eyes: Conjunctivae are normal. Right eye exhibits no discharge. Left eye exhibits no discharge.  Neck: Neck supple.  Cardiovascular: Normal rate, regular rhythm and normal heart sounds.  Exam reveals no gallop and no friction rub.   No murmur heard. Pulmonary/Chest: Effort normal and breath sounds normal. No respiratory distress.  Abdominal: Soft. He exhibits no distension. There is no tenderness.  Musculoskeletal: He exhibits no edema and no tenderness.  Neurological: He is alert.  Skin: Skin is warm and dry.  Psychiatric: He has a normal mood and affect. His behavior is normal. Thought content normal.    ED Course  Procedures (including  critical care time)  INCISION AND DRAINAGE Performed by: Virgel Manifold Consent: Verbal consent obtained. Risks and benefits: risks, benefits and alternatives were discussed Type: abscess  Body area: face  Anesthesia: local infiltration  Incision was made with a scalpel.  Local anesthetic: 0.5% bupivacaine  Anesthetic total: 2 ml  Complexity: complex Blunt dissection to break up loculations  Drainage: purulent  Drainage amount: small  Packing material: none  Patient tolerance: Patient tolerated the procedure well with no immediate complications.   Labs Review Labs Reviewed - No data to display  Imaging Review No results found.   EKG Interpretation None      MDM   Final diagnoses:  Facial abscess    62yM with facial abscess. I&D. Perhaps some mild surrounding cellulitis. Given location on face, will cover with abx. Continued wound care/return precautions discussed.     Virgel Manifold, MD 03/07/14 [phone]

## 2014-03-07 NOTE — Telephone Encounter (Signed)
If serosanguinous discharge, no abx needed.  ONLY if red and worsening.

## 2014-03-08 ENCOUNTER — Ambulatory Visit: Payer: Self-pay | Admitting: Physician Assistant

## 2014-04-05 ENCOUNTER — Telehealth: Payer: Self-pay | Admitting: Cardiovascular Disease

## 2014-04-05 NOTE — Telephone Encounter (Signed)
Closed encounter °

## 2014-04-10 ENCOUNTER — Ambulatory Visit (INDEPENDENT_AMBULATORY_CARE_PROVIDER_SITE_OTHER): Payer: Medicare PPO | Admitting: Cardiovascular Disease

## 2014-04-10 ENCOUNTER — Encounter: Payer: Self-pay | Admitting: Cardiovascular Disease

## 2014-04-10 VITALS — BP 108/68 | HR 74 | Resp 20 | Ht 69.0 in | Wt 159.1 lb

## 2014-04-10 DIAGNOSIS — I2510 Atherosclerotic heart disease of native coronary artery without angina pectoris: Secondary | ICD-10-CM

## 2014-04-10 DIAGNOSIS — J439 Emphysema, unspecified: Secondary | ICD-10-CM

## 2014-04-10 DIAGNOSIS — J438 Other emphysema: Secondary | ICD-10-CM

## 2014-04-10 DIAGNOSIS — F17200 Nicotine dependence, unspecified, uncomplicated: Secondary | ICD-10-CM

## 2014-04-10 DIAGNOSIS — E7800 Pure hypercholesterolemia, unspecified: Secondary | ICD-10-CM

## 2014-04-10 DIAGNOSIS — R683 Clubbing of fingers: Secondary | ICD-10-CM

## 2014-04-10 NOTE — Patient Instructions (Signed)
Dr Sallyanne Kuster wants you to follow-up in 12 months. You will receive a reminder letter in the mail two months in advance. If you don't receive a letter, please call our office to schedule the follow-up appointment.

## 2014-04-12 ENCOUNTER — Telehealth: Payer: Self-pay | Admitting: Family Medicine

## 2014-04-12 NOTE — Telephone Encounter (Signed)
[phone]  PT left a message stating he had questions about his take home colonoscopy test, I feel like it has to do with the stool sample kit we sent, Can you please call the pt in regards to this.

## 2014-04-14 DIAGNOSIS — R683 Clubbing of fingers: Secondary | ICD-10-CM | POA: Insufficient documentation

## 2014-04-14 NOTE — Progress Notes (Signed)
Patient ID: [name], male   DOB: 1950-11-25, 63 y.o.   MRN: [id number]     Reason for office visit CAD  [name] returns in follow-up for coronary artery disease. He has not had new cardiac problems. Unfortunately he continues to smoke. Roughly 6 months ago he had an episode of bilateral chest discomfort radiating from his spine around his chest. He did get somewhat worse with deep breathing. His chest x-ray was normal. Almost sounds like he is describing a vertebral compression fracture. He does not have exertional chest discomfort.The symptoms have resolved. Continued problems with anxiety.  [name] is now 63 years old and has been roughly 9 years since he received 2 drug-eluting stents to the right coronary artery and left circumflex coronary artery. He has had an inferior wall myocardial infarction. His most recent nuclear stress test did not show any ischemia in February 2011. It was reported as showing diaphragmatic attenuation artifact, but I believe it truly shows an inferior scar. His left ventricular ejection fraction was borderline at 51%. Should be noted that when he initially presented with coronary problems in 2005 his LV EF was 40% with a severely hypokinetic inferior wall. He wears an orthosis on his right knee. He has a lifelong history of heavy smoking   Allergies  Allergen Reactions  . Codeine Nausea Only  . Niaspan [Niacin Er]     Current Outpatient Prescriptions  Medication Sig Dispense Refill  . AFLURIA SUSP       . ALPRAZolam (XANAX) 1 MG tablet Take 1 mg by mouth 4 (four) times daily.      Marland Kitchen aspirin EC 81 MG tablet Take 81 mg by mouth at bedtime.      Marland Kitchen atorvastatin (LIPITOR) 20 MG tablet Take 20 mg by mouth at bedtime.      . Calcium Carbonate 1500 MG TABS Take 1,500 mg by mouth daily.      . cholecalciferol (VITAMIN D) 1000 UNITS tablet Take 1,000 Units by mouth daily.      . traZODone (DESYREL) 100 MG tablet Take 400 mg by mouth at bedtime.      Marland Kitchen  venlafaxine (EFFEXOR) 75 MG tablet Take 150 mg by mouth 2 (two) times daily.       Marland Kitchen ZOSTAVAX 42395 UNT/0.65ML injection        No current facility-administered medications for this visit.    Past Medical History  Diagnosis Date  . Myocardial infarction   . Depression   . Anxiety   . Panic attacks   . CAD (coronary artery disease)   . COPD (chronic obstructive pulmonary disease)   . Smoker     Past Surgical History  Procedure Laterality Date  . Coronary stent placement  2005    RCA & CX  . Back surgery  12/24/2000    L5,S1  . Inguinal hernia repair  12/1978    right side  . Nm myocar perf wall motion  09/07/2009    No ischemia; EF 51%  . Hernia repair Right 1980's  . Spine surgery  2002    L5-S1  . Shoulder surgery Left 08/2010    Family History  Problem Relation Age of Onset  . Heart attack Father   . Kidney disease Father     renal failure  . Heart failure Mother   . Heart attack Mother   . Cancer Brother   . Diabetes Brother   . Alcohol abuse Brother   . Diabetes Sister  History   Social History  . Marital Status: Single    Spouse Name: N/A    Number of Children: N/A  . Years of Education: N/A   Occupational History  . Not on file.   Social History Main Topics  . Smoking status: Current Every Day Smoker -- 1.00 packs/day    Types: Cigarettes  . Smokeless tobacco: Never Used  . Alcohol Use: No  . Drug Use: No  . Sexual Activity: Not on file   Other Topics Concern  . Not on file   Social History Narrative  . No narrative on file    Review of systems: The patient specifically denies any recent chest pain at rest or with exertion, dyspnea at rest, orthopnea, paroxysmal nocturnal dyspnea, syncope, palpitations, focal neurological deficits, intermittent claudication, lower extremity edema, unexplained weight gain.  The patient also denies abdominal pain, nausea, vomiting, dysphagia, diarrhea, constipation, polyuria, polydipsia, dysuria, hematuria,  frequency, urgency, abnormal bleeding or bruising, fever, chills, unexpected weight changes, mood swings, change in skin or hair texture, change in voice quality, auditory or visual problems, allergic reactions or rashes, new musculoskeletal complaints other than usual "aches and pains".   PHYSICAL EXAM BP 108/68  Pulse 74  Resp 20  Ht 5' 9"  (1.753 m)  Wt 72.167 kg (159 lb 1.6 oz)  BMI 23.48 kg/m2 General: Alert, oriented x3, no distress  Head: no evidence of trauma, PERRL, EOMI, no exophtalmos or lid lag, no myxedema, no xanthelasma; normal ears, nose and oropharynx  Neck: normal jugular venous pulsations and no hepatojugular reflux; brisk carotid pulses without delay and no carotid bruits  Chest: clear to auscultation, no signs of consolidation by percussion or palpation, normal fremitus, symmetrical and full respiratory excursions  Cardiovascular: normal position and quality of the apical impulse, regular rhythm, normal first and second heart sounds, no murmurs, rubs or gallops  Abdomen: no tenderness or distention, no masses by palpation, no abnormal pulsatility or arterial bruits, normal bowel sounds, no hepatosplenomegaly  Extremities: He has bilateral finger clubbing, but no cyanosis or edema; 2+ radial, ulnar and brachial pulses bilaterally; 2+ right femoral, posterior tibial and dorsalis pedis pulses; 2+ left femoral, posterior tibial and dorsalis pedis pulses; no subclavian or femoral bruits. He wears an orthosis on his right knee  Neurological: grossly nonfocal   EKG: Sinus rhythm, deep Q waves in the inferior leads, no repolarization abnormalities  Lipid Panel     Component Value Date/Time   CHOL 124 10/20/2013 1312   TRIG 74 10/20/2013 1312   HDL 37* 10/20/2013 1312   CHOLHDL 3.4 10/20/2013 1312   VLDL 15 10/20/2013 1312   LDLCALC 72 10/20/2013 1312    BMET    Component Value Date/Time   NA 137 10/20/2013 1312   K 4.2 10/20/2013 1312   CL 104 10/20/2013 1312   CO2 25 10/20/2013 1312     GLUCOSE 76 10/20/2013 1312   BUN 13 10/20/2013 1312   CREATININE 0.92 10/20/2013 1312   CREATININE 0.9 08/16/2010 1202   CALCIUM 9.0 10/20/2013 1312   GFRNONAA 89 10/20/2013 1312   GFRNONAA >60 10/18/2009 1328   GFRAA >89 10/20/2013 1312   GFRAA  Value: >60        The eGFR has been calculated using the MDRD equation. This calculation has not been validated in all clinical situations. eGFR's persistently <60 mL/min signify possible Chronic Kidney Disease. 10/18/2009 [name]  [name] does not have any current symptoms to suggest  coronary insufficiency. The episode occurred 6 months ago sounds more like a small vertebral compression fracture rather than angina pectoris. He is very lean, fair skinned, sedentary and does not spend time outside and has a lifelong history of smoking. All of these put him at high risk for osteoporosis.  HeSays that he cannot quit smoking. He says that is the only way he can cope with severe anxiety. Have discussed this issue in the past and discussed again in some detail today. At one point our discussions about smoking cessation were torpedoed By the factthat his insurance company charged him a substantial amount of money when I documented smoking cessation as part of our clinical interaction.  Continue secondary prevention for coronary disease with statin, aspirin. He does not have diabetes or hypertension.  I am sure that he has COPD. I am worried by the fact that he seems to be developing more obvious clubbing off his fingers. His chest x-ray in April was consistent with chronic lung disease but did not show evidence of mass or nodule.    Orders Placed This Encounter  Procedures  . EKG 12-Lead   Meds ordered this encounter  Medications  . AFLURIA SUSP    Sig:   . ZOSTAVAX [id number] UNT/0.65ML injection    Sig:     Holli Humbles, MD, [facility] [phone] office [phone] pager

## 2014-04-17 NOTE — Telephone Encounter (Signed)
Pt states that he never received the stool cards. Pt aware will leave them up front for him to stop and get at his convenience.

## 2014-04-24 ENCOUNTER — Ambulatory Visit: Payer: Medicare PPO | Admitting: Physician Assistant

## 2014-04-25 ENCOUNTER — Encounter: Payer: Self-pay | Admitting: Physician Assistant

## 2014-04-25 ENCOUNTER — Ambulatory Visit (INDEPENDENT_AMBULATORY_CARE_PROVIDER_SITE_OTHER): Payer: Medicare PPO | Admitting: Physician Assistant

## 2014-04-25 VITALS — BP 112/62 | HR 68 | Temp 97.9°F | Resp 18 | Wt 158.0 lb

## 2014-04-25 DIAGNOSIS — M25512 Pain in left shoulder: Secondary | ICD-10-CM

## 2014-04-25 NOTE — Progress Notes (Signed)
    Patient ID: [name] MRN: [id number], DOB: Aug 06, 1950, 63 y.o. Date of Encounter: 04/25/2014, 2:26 PM    Chief Complaint:  Chief Complaint  Patient presents with  . left shoulder injury    fell at home last Wednesday     HPI: 63 y.o. year old male reports that on Wednesday 04/19/14 he fell at home. When he fell, his left shoulder hit the edge of the coffee table. Since then, he has been feeling discomfort in the shoulder blade area and shoulder area. He still cannot fully abduct his shoulder the way he could prior to the fall.  He is concerned because he has had an injury in the past that required surgery to the left shoulder 9about 2 years ago, per pt). He says that Dr. Mardelle Matte ended up doing that surgery but says that Dr. Mardelle Matte is "Out of Network" in regards to insurance coverage. Therefore patient is requesting to see Dr. Aline Brochure first, as he thinks that Dr. Aline Brochure is "In Network".     Home Meds:   Outpatient Prescriptions Prior to Visit  Medication Sig Dispense Refill  . ALPRAZolam (XANAX) 1 MG tablet Take 1 mg by mouth 4 (four) times daily.      Marland Kitchen aspirin EC 81 MG tablet Take 81 mg by mouth at bedtime.      Marland Kitchen atorvastatin (LIPITOR) 20 MG tablet Take 20 mg by mouth at bedtime.      . Calcium Carbonate 1500 MG TABS Take 1,500 mg by mouth daily.      . cholecalciferol (VITAMIN D) 1000 UNITS tablet Take 1,000 Units by mouth daily.      . traZODone (DESYREL) 100 MG tablet Take 400 mg by mouth at bedtime.      Marland Kitchen venlafaxine (EFFEXOR) 75 MG tablet Take 150 mg by mouth 2 (two) times daily.       Marland Kitchen AFLURIA SUSP       . ZOSTAVAX 21308 UNT/0.65ML injection        No facility-administered medications prior to visit.    Allergies:  Allergies  Allergen Reactions  . Codeine Nausea Only  . Niaspan [Niacin Er]       Review of Systems: See HPI for pertinent ROS. All other ROS negative.    Physical Exam: Blood pressure 112/62, pulse 68, temperature 97.9 F (36.6 C),  temperature source Oral, resp. rate 18, weight 158 lb (71.668 kg)., Body mass index is 23.32 kg/(m^2). General: Thin WM.  Appears in no acute distress. Neck: Supple. No thyromegaly. No lymphadenopathy. Lungs: Clear bilaterally to auscultation without wheezes, rales, or rhonchi. Breathing is unlabored. Heart: Regular rhythm. No murmurs, rubs, or gallops. Msk:  Strength and tone normal for age. Left Shoulder: 5/5 forearm strength with adduction and abduction. He cannot completely abduct. Extremities/Skin: Warm and dry. Neuro: Alert and oriented X 3. Moves all extremities spontaneously. Gait is normal. CNII-XII grossly in tact. Psych:  Responds to questions appropriately with a normal affect.     ASSESSMENT AND PLAN:  63 y.o. year old male with  1. Left shoulder pain He needs followup with an orthopedist. Because of insurance coverage, will refer him to Dr. Aline Brochure, per patient request. - Ambulatory referral to Orthopedic Surgery   Signed, [facility] [place], [place], [facility] 04/25/2014 2:26 PM

## 2014-05-08 ENCOUNTER — Ambulatory Visit: Payer: Medicare PPO | Admitting: Orthopedic Surgery

## 2014-06-29 ENCOUNTER — Other Ambulatory Visit: Payer: Self-pay | Admitting: Cardiovascular Disease

## 2014-06-30 NOTE — Telephone Encounter (Signed)
Rx refill sent to patient pharmacy   

## 2014-07-25 ENCOUNTER — Telehealth: Payer: Self-pay | Admitting: *Deleted

## 2014-07-25 NOTE — Telephone Encounter (Signed)
Signed authorization faxed to [facility] for nicotine replacement therapy.

## 2014-08-08 ENCOUNTER — Encounter (HOSPITAL_COMMUNITY): Payer: Self-pay | Admitting: Emergency Medicine

## 2014-08-08 ENCOUNTER — Emergency Department (HOSPITAL_COMMUNITY)
Admission: EM | Admit: 2014-08-08 | Discharge: 2014-08-09 | Disposition: A | Discharge status: Home / Self Care | Payer: Medicare PPO | Attending: Emergency Medicine | Admitting: Emergency Medicine

## 2014-08-08 DIAGNOSIS — Y9389 Activity, other specified: Secondary | ICD-10-CM | POA: Diagnosis not present

## 2014-08-08 DIAGNOSIS — S01511A Laceration without foreign body of lip, initial encounter: Secondary | ICD-10-CM | POA: Diagnosis present

## 2014-08-08 DIAGNOSIS — I252 Old myocardial infarction: Secondary | ICD-10-CM | POA: Insufficient documentation

## 2014-08-08 DIAGNOSIS — W228XXA Striking against or struck by other objects, initial encounter: Secondary | ICD-10-CM | POA: Insufficient documentation

## 2014-08-08 DIAGNOSIS — Z7982 Long term (current) use of aspirin: Secondary | ICD-10-CM | POA: Diagnosis not present

## 2014-08-08 DIAGNOSIS — Z9861 Coronary angioplasty status: Secondary | ICD-10-CM | POA: Insufficient documentation

## 2014-08-08 DIAGNOSIS — Y9289 Other specified places as the place of occurrence of the external cause: Secondary | ICD-10-CM | POA: Insufficient documentation

## 2014-08-08 DIAGNOSIS — I2510 Atherosclerotic heart disease of native coronary artery without angina pectoris: Secondary | ICD-10-CM | POA: Insufficient documentation

## 2014-08-08 DIAGNOSIS — J449 Chronic obstructive pulmonary disease, unspecified: Secondary | ICD-10-CM | POA: Diagnosis not present

## 2014-08-08 DIAGNOSIS — F410 Panic disorder [episodic paroxysmal anxiety] without agoraphobia: Secondary | ICD-10-CM | POA: Diagnosis not present

## 2014-08-08 DIAGNOSIS — Z79899 Other long term (current) drug therapy: Secondary | ICD-10-CM | POA: Diagnosis not present

## 2014-08-08 DIAGNOSIS — Y998 Other external cause status: Secondary | ICD-10-CM | POA: Diagnosis not present

## 2014-08-08 DIAGNOSIS — Z720 Tobacco use: Secondary | ICD-10-CM | POA: Insufficient documentation

## 2014-08-08 DIAGNOSIS — F329 Major depressive disorder, single episode, unspecified: Secondary | ICD-10-CM | POA: Insufficient documentation

## 2014-08-08 NOTE — ED Notes (Signed)
Patient fell on trailer and hit mouth on rusted siderail per brother. Large laceration to lower lip. Patient states dentures broke when he fell. Unsure of last tetanus shot.

## 2014-08-09 MED ORDER — HYDROCODONE-ACETAMINOPHEN 5-325 MG PO TABS: 1.0000 | ORAL_TABLET | ORAL | Status: DC | PRN

## 2014-08-09 MED ORDER — LIDOCAINE-EPINEPHRINE (PF) 1 %-1:200000 IJ SOLN: 20.0000 mL | Freq: Once | INTRAMUSCULAR | Status: DC

## 2014-08-09 MED ORDER — MORPHINE SULFATE 4 MG/ML IJ SOLN
4.0000 mg | Freq: Once | INTRAMUSCULAR | Status: AC
  Administered 2014-08-09: 4 mg via INTRAVENOUS

## 2014-08-09 MED ORDER — MORPHINE SULFATE 4 MG/ML IJ SOLN
INTRAMUSCULAR | Status: AC
  Filled 2014-08-09: qty 1

## 2014-08-09 MED ORDER — AMOXICILLIN 500 MG PO CAPS: 500.0000 mg | ORAL_CAPSULE | Freq: Three times a day (TID) | ORAL | Status: DC

## 2014-08-09 MED ORDER — LIDOCAINE-EPINEPHRINE (PF) 2 %-1:200000 IJ SOLN
INTRAMUSCULAR | Status: AC
  Administered 2014-08-09: 20 mL
  Filled 2014-08-09: qty 20

## 2014-08-09 NOTE — ED Provider Notes (Signed)
64 year old male suffered a laceration to his lower lip when he tripped and fell on a high hit his face against a steel pole. He states that his upper denture shattered. He has a complex laceration of his lower lip which extends through the for median border. Patient was advised of likelihood of increased scarring because of laceration crossing the for median border. There was significant amount of soft tissue trauma associated with this. Laceration was repaired by Awilda Metro PA-C. He is discharged with a short course of prophylactic antibiotics.  Medical screening examination/treatment/procedure(s) were conducted as a shared visit with non-physician practitioner(s) and myself.  I personally evaluated the patient during the encounter.    Delora Fuel, MD 48/59/27 [id number]

## 2014-08-09 NOTE — ED Provider Notes (Addendum)
CSN: [id number]     Arrival date & time 08/08/14  2330 History   First MD Initiated Contact with Patient 08/08/14 2352     Chief Complaint  Patient presents with  . Fall  . Lip Laceration     (Consider location/radiation/quality/duration/timing/severity/associated sxs/prior Treatment) Patient is a 64 y.o. male presenting with fall. The history is provided by the patient.  Fall This is a new problem. The current episode started today (30 minutes prior to arrival.  He tripped hitting his mouth against a side rail of a trailer.  Lower lip laceration and broken dentures. ). The problem has been unchanged. Pertinent negatives include no arthralgias, headaches, nausea, neck pain, numbness, visual change or vomiting. Nothing aggravates the symptoms. Treatments tried: direct pressure to the wound. The treatment provided no relief.    Past Medical History  Diagnosis Date  . Myocardial infarction   . Depression   . Anxiety   . Panic attacks   . CAD (coronary artery disease)   . COPD (chronic obstructive pulmonary disease)   . Smoker    Past Surgical History  Procedure Laterality Date  . Coronary stent placement  2005    RCA & CX  . Back surgery  12/24/2000    L5,S1  . Inguinal hernia repair  12/1978    right side  . Nm myocar perf wall motion  09/07/2009    No ischemia; EF 51%  . Hernia repair Right 1980's  . Spine surgery  2002    L5-S1  . Shoulder surgery Left 08/2010   Family History  Problem Relation Age of Onset  . Heart attack Father   . Kidney disease Father     renal failure  . Heart failure Mother   . Heart attack Mother   . Cancer Brother   . Diabetes Brother   . Alcohol abuse Brother   . Diabetes Sister    History  Substance Use Topics  . Smoking status: Current Every Day Smoker -- 1.00 packs/day    Types: Cigarettes  . Smokeless tobacco: Never Used  . Alcohol Use: No    Review of Systems  Gastrointestinal: Negative for nausea and vomiting.   Musculoskeletal: Negative for arthralgias and neck pain.  Neurological: Negative for numbness and headaches.      Allergies  Codeine and Niaspan  Home Medications   Prior to Admission medications   Medication Sig Start Date End Date Taking? Authorizing Provider  ALPRAZolam Duanne Moron) 1 MG tablet Take 1 mg by mouth 4 (four) times daily.    Historical Provider, MD  amoxicillin (AMOXIL) 500 MG capsule Take 1 capsule (500 mg total) by mouth 3 (three) times daily. 08/09/14   Evalee Jefferson, PA-C  aspirin EC 81 MG tablet Take 81 mg by mouth at bedtime.    Historical Provider, MD  atorvastatin (LIPITOR) 20 MG tablet TAKE 1 TABLET AT BEDTIME 06/30/14   Mihai Croitoru, MD  Calcium Carbonate 1500 MG TABS Take 1,500 mg by mouth daily.    Historical Provider, MD  cholecalciferol (VITAMIN D) 1000 UNITS tablet Take 1,000 Units by mouth daily.    Historical Provider, MD  HYDROcodone-acetaminophen (NORCO/VICODIN) 5-325 MG per tablet Take 1 tablet by mouth every 4 (four) hours as needed. 08/09/14   Evalee Jefferson, PA-C  traZODone (DESYREL) 100 MG tablet Take 400 mg by mouth at bedtime. 12/28/13   Historical Provider, MD  venlafaxine (EFFEXOR) 75 MG tablet Take 150 mg by mouth 2 (two) times daily.     Historical  Provider, MD   BP 163/65 mmHg  Pulse 78  Temp(Src) 98.4 F (36.9 C) (Oral)  Resp 20  Wt 160 lb (72.576 kg)  SpO2 93% Physical Exam  Constitutional: He is oriented to person, place, and time. He appears well-developed and well-nourished.  HENT:  Head: Normocephalic.  Right Ear: No hemotympanum.  Left Ear: No hemotympanum.  Nose: Nose normal.  Mouth/Throat: Lacerations present.    Large irregular laceration through lower lip, approx 10 cm,  several flaps, through vermillion border.  Lower dentist without fracture, upper edentulous.  Fracture of dentures.  Mandible nontender, no deformity, FROM without jaw pain.  Eyes: EOM are normal. Pupils are equal, round, and reactive to light.  Cardiovascular:  Normal rate.   Pulmonary/Chest: Effort normal.  Neurological: He is alert and oriented to person, place, and time. No cranial nerve deficit or sensory deficit.  Skin: Laceration noted.    ED Course  Procedures (including critical care time)  LACERATION REPAIR Performed by: Evalee Jefferson Authorized by: Evalee Jefferson Consent: Verbal consent obtained. Risks and benefits: risks, benefits and alternatives were discussed Consent given by: patient Patient identity confirmed: provided demographic data Prepped and Draped in normal sterile fashion Wound explored  Laceration Location: left lower lip  Laceration Length: irregular,  Flap laceration, involving mucosa and through vermillion border. Complicated repair involving wound edge revision, multi layer repair.  No Foreign Bodies seen or palpated  Anesthesia: dental block  Local anesthetic: lidocaine marcaine 0.5% 1 cc for left dental block.  Touch up lidocaine 2% with epi along right wound border 0.5 cc  Anesthetic total: per above  Irrigation method: NS and saf clens spray  Amount of cleaning: standard  Skin closure: 5-0 vicryl  Number of sutures: 3 subcutaneous sutures, 13 surface sutures placed  Technique: simple interrupted  Patient tolerance: Patient tolerated the procedure well with no immediate complications.  Labs Review Labs Reviewed - No data to display  Imaging Review No results found.   EKG Interpretation None      MDM   Final diagnoses:  Laceration of lip, initial encounter    Last tetanus less than 5 years.  Amoxil, hydrocodone. Advised ice packs prn swelling, recheck for any signs of infection, suture removal in 5 days if bothering, although absorbable sutures used, pt understands plan.    Evalee Jefferson, PA-C 16/07/37 1062  David Glick, MD 69/48/54 [id number]  Julie Idol, PA-C 35/00/93 8182  Delora Fuel, MD 99/37/16 [id number]

## 2014-08-09 NOTE — Discharge Instructions (Signed)
Mouth Laceration A mouth laceration is a cut inside the mouth.  HOME CARE  Rinse your mouth with warm water with peroxide (5 parts water to 1 part peroxide) twice daily.  Brush your teeth as usual if you can.  Do not eat hot food or have hot drinks while your mouth is still numb.  Avoid acidic foods or other foods that bother your cut.  Only take medicine as told by your doctor.  Keep all doctor visits as told.  If there are stitches (sutures) in the mouth, do not play with them with your tongue. You may need a tetanus shot if:  You cannot remember when you had your last tetanus shot.  You have never had a tetanus shot. If you need a tetanus shot and you choose not to have one, you may get tetanus. Sickness from tetanus can be serious. GET HELP RIGHT AWAY IF:   Your cut or other parts of your face are puffy (swollen) or painful.  You have a fever.  Your throat is puffy or tender.  Your cut breaks open after stitches have been removed.  You see yellowish-white fluid (pus) coming from the cut. MAKE SURE YOU:   Understand these instructions.  Will watch your condition.  Will get help right away if you are not doing well or get worse. Document Released: 12/17/2007 Document Revised: 11/14/2013 Document Reviewed: 01/02/2011 [facility] Patient Information 2015 [place], [place]. This information is not intended to replace advice given to you by your health care provider. Make sure you discuss any questions you have with your health care provider.  Use an ice pack to help with swelling and pain.  You may take the hydrocodone prescribed for pain relief.  This will make you drowsy - do not drive within 4 hours of taking this medication.  Take the antibiotic until completed to help prevent infection.

## 2014-08-25 ENCOUNTER — Encounter: Payer: Self-pay | Admitting: Family Medicine

## 2014-08-25 ENCOUNTER — Ambulatory Visit (INDEPENDENT_AMBULATORY_CARE_PROVIDER_SITE_OTHER): Payer: Medicare PPO | Admitting: Family Medicine

## 2014-08-25 VITALS — BP 134/70 | HR 74 | Temp 98.3°F | Resp 16 | Ht 69.0 in | Wt 160.0 lb

## 2014-08-25 DIAGNOSIS — K122 Cellulitis and abscess of mouth: Secondary | ICD-10-CM

## 2014-08-25 MED ORDER — AMOXICILLIN 875 MG PO TABS: 875.0000 mg | ORAL_TABLET | Freq: Two times a day (BID) | ORAL | Status: DC

## 2014-08-25 NOTE — Progress Notes (Signed)
Subjective:    Patient ID: [name], male    DOB: Jan 05, 1951, 64 y.o.   MRN: [id number]  HPI  Patient fell on January 26 and suffered trauma to his face. He lacerated the inside of his mouth and his lower lip on the left side. This required that he go to the emergency room where he underwent irrigation to remove pieces of rust from inside the wound.  The complex laceration was then closed in the emergency room with dissolvable sutures. He is here today because his lower lip is beginning to swell and become painful. On examination today the laceration is well healed. The sutures have dissolved. His lower lip is slightly erythematous on the inside of his lip. There is also a firm area which appears to be scar tissue. It is not fluctuant. It is mildly sore. It does not appear to be an abscess. It is not warm to the touch. Past Medical History  Diagnosis Date  . Myocardial infarction   . Depression   . Anxiety   . Panic attacks   . CAD (coronary artery disease)   . COPD (chronic obstructive pulmonary disease)   . Smoker    Past Surgical History  Procedure Laterality Date  . Coronary stent placement  2005    RCA & CX  . Back surgery  12/24/2000    L5,S1  . Inguinal hernia repair  12/1978    right side  . Nm myocar perf wall motion  09/07/2009    No ischemia; EF 51%  . Hernia repair Right 1980's  . Spine surgery  2002    L5-S1  . Shoulder surgery Left 08/2010   Current Outpatient Prescriptions on File Prior to Visit  Medication Sig Dispense Refill  . ALPRAZolam (XANAX) 1 MG tablet Take 1 mg by mouth 4 (four) times daily.    Marland Kitchen amoxicillin (AMOXIL) 500 MG capsule Take 1 capsule (500 mg total) by mouth 3 (three) times daily. 15 capsule 0  . aspirin EC 81 MG tablet Take 81 mg by mouth at bedtime.    Marland Kitchen atorvastatin (LIPITOR) 20 MG tablet TAKE 1 TABLET AT BEDTIME 90 tablet 2  . Calcium Carbonate 1500 MG TABS Take 1,500 mg by mouth daily.    . cholecalciferol (VITAMIN D) 1000 UNITS  tablet Take 1,000 Units by mouth daily.    . traZODone (DESYREL) 100 MG tablet Take 400 mg by mouth at bedtime.    Marland Kitchen venlafaxine (EFFEXOR) 75 MG tablet Take 150 mg by mouth 2 (two) times daily.      No current facility-administered medications on file prior to visit.   Allergies  Allergen Reactions  . Codeine Nausea Only  . Niaspan [Niacin Er]    History   Social History  . Marital Status: Single    Spouse Name: N/A  . Number of Children: N/A  . Years of Education: N/A   Occupational History  . Not on file.   Social History Main Topics  . Smoking status: Current Every Day Smoker -- 1.00 packs/day    Types: Cigarettes  . Smokeless tobacco: Never Used  . Alcohol Use: No  . Drug Use: No  . Sexual Activity: Not on file   Other Topics Concern  . Not on file   Social History Narrative     Review of Systems  All other systems reviewed and are negative.      Objective:   Physical Exam  Cardiovascular: Normal rate and regular rhythm.  Pulmonary/Chest: Effort normal and breath sounds normal.  Vitals reviewed.  left side of lower lip is still slightly swollen. There is firm areas of scar tissue under the healing laceration. There is no fluctuance or abscess. There is some mild erythema on the inside of the lip.        Assessment & Plan:  Cellulitis of mouth - Plan: amoxicillin (AMOXIL) 875 MG tablet  Just to be on the safe side I would treat the erythema with amoxicillin 875 mg by mouth twice a day for 10 days to cover for possible early infection. I do not feel the patient requires incision and drainage at this time is there is no obvious fluctuance on exam..

## 2014-11-27 DIAGNOSIS — F3342 Major depressive disorder, recurrent, in full remission: Secondary | ICD-10-CM | POA: Diagnosis not present

## 2015-01-30 ENCOUNTER — Encounter: Payer: Self-pay | Admitting: Family Medicine

## 2015-01-30 ENCOUNTER — Ambulatory Visit (INDEPENDENT_AMBULATORY_CARE_PROVIDER_SITE_OTHER): Payer: Medicare PPO | Admitting: Family Medicine

## 2015-01-30 VITALS — BP 110/60 | HR 68 | Temp 97.5°F | Resp 18 | Ht 69.0 in | Wt 160.0 lb

## 2015-01-30 DIAGNOSIS — R252 Cramp and spasm: Secondary | ICD-10-CM | POA: Diagnosis not present

## 2015-01-30 MED ORDER — CYCLOBENZAPRINE HCL 10 MG PO TABS: 10.0000 mg | ORAL_TABLET | Freq: Three times a day (TID) | ORAL | Status: DC | PRN

## 2015-01-30 NOTE — Progress Notes (Signed)
Subjective:    Patient ID: [name], male    DOB: 1951/04/07, 64 y.o.   MRN: [id number]  HPI Patient is a very pleasant 64 year old white male who presents complaining of frequent severe cramps. They occur during the day as well as at night. They are unrelated to exertion. They primarily occur in his calves and in his feet. However they also can occur in his hands and in his shoulders. They keep him from sleeping at night. He denies any symptoms of claudication. Patient has tried to treat this by drinking more water and drinking more Gatorade. He denies any fevers or chills or rashes. He denies any myalgias. He denies any arthralgias. Past Medical History  Diagnosis Date  . Myocardial infarction   . Depression   . Anxiety   . Panic attacks   . CAD (coronary artery disease)   . COPD (chronic obstructive pulmonary disease)   . Smoker    Past Surgical History  Procedure Laterality Date  . Coronary stent placement  2005    RCA & CX  . Back surgery  12/24/2000    L5,S1  . Inguinal hernia repair  12/1978    right side  . Nm myocar perf wall motion  09/07/2009    No ischemia; EF 51%  . Hernia repair Right 1980's  . Spine surgery  2002    L5-S1  . Shoulder surgery Left 08/2010   Current Outpatient Prescriptions on File Prior to Visit  Medication Sig Dispense Refill  . ALPRAZolam (XANAX) 1 MG tablet Take 1 mg by mouth 4 (four) times daily.    Marland Kitchen aspirin EC 81 MG tablet Take 81 mg by mouth at bedtime.    Marland Kitchen atorvastatin (LIPITOR) 20 MG tablet TAKE 1 TABLET AT BEDTIME 90 tablet 2  . traZODone (DESYREL) 100 MG tablet Take 400 mg by mouth at bedtime.    Marland Kitchen venlafaxine (EFFEXOR) 75 MG tablet Take 150 mg by mouth 2 (two) times daily.      No current facility-administered medications on file prior to visit.   Allergies  Allergen Reactions  . Codeine Nausea Only  . Niaspan [Niacin Er]    History   Social History  . Marital Status: Single    Spouse Name: N/A  . Number of Children:  N/A  . Years of Education: N/A   Occupational History  . Not on file.   Social History Main Topics  . Smoking status: Current Every Day Smoker -- 1.00 packs/day    Types: Cigarettes  . Smokeless tobacco: Never Used  . Alcohol Use: No  . Drug Use: No  . Sexual Activity: Not on file   Other Topics Concern  . Not on file   Social History Narrative      Review of Systems  All other systems reviewed and are negative.      Objective:   Physical Exam  Constitutional: He appears well-developed and well-nourished.  Cardiovascular: Normal rate, regular rhythm and normal heart sounds.   Pulmonary/Chest: Effort normal and breath sounds normal.  Abdominal: Soft. Bowel sounds are normal.  Musculoskeletal: Normal range of motion. He exhibits no edema or tenderness.  Neurological: He exhibits normal muscle tone.  Vitals reviewed.  patient does have a brace on his right leg        Assessment & Plan:  Muscle cramps - Plan: COMPLETE METABOLIC PANEL WITH GFR, Magnesium, CBC with Differential/Platelet, cyclobenzaprine (FLEXERIL) 10 MG tablet  I will check a CMP to monitor for  hypokalemia, hypocalcemia, I will also check a magnesium level. I will treat the cramps temporarily with Flexeril 10 mg by mouth daily at bedtime. I recommended the patient try tonic water with quinine as well as magnesium supplements over-the-counter. If lab work is normal and the over-the-counter remedies are not successful we could try hydroxychloroquine daily for 2 weeks and then once a week thereafter for prevention of cramps

## 2015-01-31 LAB — COMPLETE METABOLIC PANEL WITH GFR
ALT: 13 U/L (ref 0–53)
AST: 17 U/L (ref 0–37)
Albumin: 3.7 g/dL (ref 3.5–5.2)
Alkaline Phosphatase: 71 U/L (ref 39–117)
BUN: 13 mg/dL (ref 6–23)
CO2: 22 mEq/L (ref 19–32)
Calcium: 8.9 mg/dL (ref 8.4–10.5)
Chloride: 104 mEq/L (ref 96–112)
Creat: 0.95 mg/dL (ref 0.50–1.35)
GFR, Est African American: >89 mL/min
GFR, Est Non African American: 85 mL/min
Glucose, Bld: 89 mg/dL (ref 70–99)
Potassium: 4.2 mEq/L (ref 3.5–5.3)
Sodium: 136 mEq/L (ref 135–145)
Total Bilirubin: 0.5 mg/dL (ref 0.2–1.2)
Total Protein: 7.2 g/dL (ref 6.0–8.3)

## 2015-01-31 LAB — CBC WITH DIFFERENTIAL/PLATELET
Basophils Absolute: 0.1 10*3/uL (ref 0.0–0.1)
Basophils Relative: 1 % (ref 0–1)
Eosinophils Absolute: 0.2 10*3/uL (ref 0.0–0.7)
Eosinophils Relative: 3 % (ref 0–5)
HCT: 43.5 % (ref 39.0–52.0)
Hemoglobin: 14.3 g/dL (ref 13.0–17.0)
Lymphocytes Relative: 29 % (ref 12–46)
Lymphs Abs: 1.9 10*3/uL (ref 0.7–4.0)
MCH: 29.7 pg (ref 26.0–34.0)
MCHC: 32.9 g/dL (ref 30.0–36.0)
MCV: 90.2 fL (ref 78.0–100.0)
MPV: 10 fL (ref 8.6–12.4)
Monocytes Absolute: 0.4 10*3/uL (ref 0.1–1.0)
Monocytes Relative: 6 % (ref 3–12)
Neutro Abs: 4.1 10*3/uL (ref 1.7–7.7)
Neutrophils Relative %: 61 % (ref 43–77)
Platelets: 213 10*3/uL (ref 150–400)
RBC: 4.82 MIL/uL (ref 4.22–5.81)
RDW: 15.6 % — ABNORMAL HIGH (ref 11.5–15.5)
WBC: 6.7 10*3/uL (ref 4.0–10.5)

## 2015-01-31 LAB — MAGNESIUM: Magnesium: 2.4 mg/dL (ref 1.5–2.5)

## 2015-02-01 ENCOUNTER — Other Ambulatory Visit: Payer: Self-pay | Admitting: Family Medicine

## 2015-02-01 MED ORDER — HYDROXYCHLOROQUINE SULFATE 200 MG PO TABS: ORAL_TABLET | ORAL | Status: DC

## 2015-02-06 ENCOUNTER — Telehealth: Payer: Self-pay | Admitting: Family Medicine

## 2015-02-06 NOTE — Telephone Encounter (Signed)
Pt was not sure what the Plaquenil was for.  I explained it was for his leg cramps.  Reinforced how to take.  If leg cramps do not seem to be betting better after a month, let us know

## 2015-02-12 ENCOUNTER — Telehealth: Payer: Self-pay | Admitting: Cardiovascular Disease

## 2015-02-12 DIAGNOSIS — E7800 Pure hypercholesterolemia, unspecified: Secondary | ICD-10-CM

## 2015-02-12 NOTE — Telephone Encounter (Signed)
Mr. Wigington is asking for a lab order to be sent to him before his appt on 05/03/15 at 3pm .. Thanks

## 2015-02-12 NOTE — Telephone Encounter (Signed)
Returned call to patient he stated he would like to have fasting lab before he sees Dr.Croitoru in [place] reviewing chart he had recent lab with PCP.Advised all he needs is a lipid panel.Orders mailed to patient.

## 2015-03-05 ENCOUNTER — Telehealth: Payer: Self-pay | Admitting: Family Medicine

## 2015-03-05 DIAGNOSIS — M8580 Other specified disorders of bone density and structure, unspecified site: Secondary | ICD-10-CM

## 2015-03-05 NOTE — Telephone Encounter (Signed)
Pt called imaging center and said he needed a "Bone Mass Scan"  Is he needing a Dexa or does he have a bone mass?  Please advise?

## 2015-03-05 NOTE — Telephone Encounter (Signed)
I do not know what he is referring to.  I did not order it.  My last note makes no mention of any imaging.

## 2015-03-06 NOTE — Telephone Encounter (Signed)
He says he had discussed this with you!!  On chest xray from 11/04/11 Osteopenia was noted.  Order for DEXA scan placed.  Pt aware.

## 2015-03-16 ENCOUNTER — Ambulatory Visit
Admission: RE | Admit: 2015-03-16 | Discharge: 2015-03-16 | Disposition: A | Discharge status: Home / Self Care | Payer: Medicare PPO | Source: Ambulatory Visit | Attending: Family Medicine | Admitting: Family Medicine

## 2015-03-16 DIAGNOSIS — Z13820 Encounter for screening for osteoporosis: Secondary | ICD-10-CM | POA: Diagnosis not present

## 2015-03-16 DIAGNOSIS — M8580 Other specified disorders of bone density and structure, unspecified site: Secondary | ICD-10-CM

## 2015-03-16 LAB — HM DEXA SCAN

## 2015-03-21 ENCOUNTER — Telehealth: Payer: Self-pay | Admitting: Family Medicine

## 2015-03-21 NOTE — Telephone Encounter (Signed)
Plaquenil 200 mg patient states that this isn't working for the cramps. He states that he is backed up with BM he states he is getting up several times a night to try to use the bathroom with no relief. He said that he was told to call if this medication didn't seem to work. Please advise.

## 2015-03-22 NOTE — Telephone Encounter (Signed)
NTBS for cramps so I can check blood flow to legs and discuss baclofen at night.  For BM, he can use miralax everyday if necessary.  If that doesn't work, I would try dulcolax.

## 2015-03-22 NOTE — Telephone Encounter (Signed)
Pt informed of below and appt made

## 2015-03-23 ENCOUNTER — Encounter: Payer: Self-pay | Admitting: Family Medicine

## 2015-03-23 ENCOUNTER — Ambulatory Visit (INDEPENDENT_AMBULATORY_CARE_PROVIDER_SITE_OTHER): Payer: Medicare PPO | Admitting: Family Medicine

## 2015-03-23 VITALS — BP 120/60 | HR 78 | Temp 97.8°F | Resp 18 | Ht 69.0 in | Wt 162.0 lb

## 2015-03-23 DIAGNOSIS — M8580 Other specified disorders of bone density and structure, unspecified site: Secondary | ICD-10-CM | POA: Diagnosis not present

## 2015-03-23 DIAGNOSIS — R252 Cramp and spasm: Secondary | ICD-10-CM

## 2015-03-23 DIAGNOSIS — K5909 Other constipation: Secondary | ICD-10-CM

## 2015-03-23 MED ORDER — MAGNESIUM CITRATE PO SOLN: 1.0000 | Freq: Once | ORAL | Status: DC

## 2015-03-23 MED ORDER — BACLOFEN 10 MG PO TABS: 10.0000 mg | ORAL_TABLET | Freq: Three times a day (TID) | ORAL | Status: DC

## 2015-03-23 MED ORDER — ALENDRONATE SODIUM 70 MG PO TABS: 70.0000 mg | ORAL_TABLET | ORAL | Status: DC

## 2015-03-23 NOTE — Progress Notes (Signed)
Subjective:    Patient ID: [name], male    DOB: 1951/03/09, 64 y.o.   MRN: [id number]  HPI 01/30/15 Patient is a very pleasant 64 year old white male who presents complaining of frequent severe cramps. They occur during the day as well as at night. They are unrelated to exertion. They primarily occur in his calves and in his feet. However they also can occur in his hands and in his shoulders. They keep him from sleeping at night. He denies any symptoms of claudication. Patient has tried to treat this by drinking more water and drinking more Gatorade. He denies any fevers or chills or rashes. He denies any myalgias. He denies any arthralgias.  At that time, my plan was: I will check a CMP to monitor for hypokalemia, hypocalcemia, I will also check a magnesium level. I will treat the cramps temporarily with Flexeril 10 mg by mouth daily at bedtime. I recommended the patient try tonic water with quinine as well as magnesium supplements over-the-counter. If lab work is normal and the over-the-counter remedies are not successful we could try hydroxychloroquine daily for 2 weeks and then once a week thereafter for prevention of cramps  03/23/15 Muscle cramps have not improved at all the plaque 1 mL. He is even tried over-the-counter magnesium without relief. They're debilitating. They primarily worse in his left hip and in his left calf. On examination today the patient has very strong dorsalis pedis pulses in his left foot. There is a faintly palpable popliteal pulse in his left popliteal fossa. There is no evidence of claudication. Muscle cramps seem to be worse at night although now he endorses some during the day when he is walking. 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 He also complains of severe constipation. The constipation is also causing urinary retention. He is not  having a bowel movement in 2-3 days. He is also urinating very little. A bone density test revealed a T score of -2.3 Past Medical History  Diagnosis Date  . Myocardial infarction   . Depression   . Anxiety   . Panic attacks   . CAD (coronary artery disease)   . COPD (chronic obstructive pulmonary disease)   . Smoker    Past Surgical History  Procedure Laterality Date  . Coronary stent placement  2005    RCA & CX  . Back surgery  12/24/2000    L5,S1  . Inguinal hernia repair  12/1978    right side  . Nm myocar perf wall motion  09/07/2009    No ischemia; EF 51%  . Hernia repair Right 1980's  . Spine surgery  2002    L5-S1  . Shoulder surgery Left 08/2010   Current Outpatient Prescriptions on File Prior to Visit  Medication Sig Dispense Refill  . ALPRAZolam (XANAX) 1 MG tablet Take 1 mg by mouth 4 (four) times daily.    Marland Kitchen aspirin EC 81 MG tablet Take 81 mg by mouth at bedtime.    Marland Kitchen atorvastatin (LIPITOR) 20 MG tablet TAKE 1 TABLET AT BEDTIME 90 tablet 2  . hydroxychloroquine (PLAQUENIL) 200 MG tablet 1 tab po qd x 2 week then once a week thereafter 30 tablet 3  . traZODone (DESYREL) 100 MG tablet Take 400 mg by mouth at bedtime.    Marland Kitchen venlafaxine (EFFEXOR) 75 MG tablet Take  150 mg by mouth 2 (two) times daily.      No current facility-administered medications on file prior to visit.   Allergies  Allergen Reactions  . Codeine Nausea Only  . Niaspan Durene Cal Er]    Social History   Social History  . Marital Status: Single    Spouse Name: N/A  . Number of Children: N/A  . Years of Education: N/A   Occupational History  . Not on file.   Social History Main Topics  . Smoking status: Current Every Day Smoker -- 1.00 packs/day    Types: Cigarettes  . Smokeless tobacco: Never Used  . Alcohol Use: No  . Drug Use: No  . Sexual Activity: Not on file   Other Topics Concern  . Not on file   Social History Narrative      Review of Systems  All other systems reviewed  and are negative.      Objective:   Physical Exam  Constitutional: He appears well-developed and well-nourished.  Cardiovascular: Normal rate, regular rhythm and normal heart sounds.   Pulmonary/Chest: Effort normal and breath sounds normal.  Abdominal: Soft. Bowel sounds are normal.  Musculoskeletal: Normal range of motion. He exhibits no edema or tenderness.  Neurological: He exhibits normal muscle tone.  Vitals reviewed.  patient does have a brace on his right leg        Assessment & Plan:  Other constipation - Plan: magnesium citrate SOLN  Cramps, muscle, general - Plan: baclofen (LIORESAL) 10 MG tablet  Osteopenia - Plan: alendronate (FOSAMAX) 70 MG tablet  address the constipation first magnesium citrate. Hopefully as the constipation clears his urinary retention will improve. If not return for a prostate exam to evaluate for BPH or other causes of urinary retention. However the urinary retention developed at the same time as his severe constipation so I believe that they're related. Regarding his muscle cramp,. Discontinue plaque 1 mL and begin baclofen 5-10 mg every 8 hours as needed. We could also consider gabapentin. Regarding his osteopenia, begin Fosamax 70 mg by mouth every week

## 2015-03-27 DIAGNOSIS — F3342 Major depressive disorder, recurrent, in full remission: Secondary | ICD-10-CM | POA: Diagnosis not present

## 2015-03-30 ENCOUNTER — Encounter: Payer: Self-pay | Admitting: Family Medicine

## 2015-03-30 DIAGNOSIS — M8580 Other specified disorders of bone density and structure, unspecified site: Secondary | ICD-10-CM | POA: Insufficient documentation

## 2015-04-17 ENCOUNTER — Encounter: Payer: Self-pay | Admitting: Family Medicine

## 2015-04-18 DIAGNOSIS — E7800 Pure hypercholesterolemia, unspecified: Secondary | ICD-10-CM | POA: Diagnosis not present

## 2015-04-19 LAB — LIPID PANEL
Cholesterol: 99 mg/dL — ABNORMAL LOW (ref 125–200)
HDL: 31 mg/dL — ABNORMAL LOW (ref >=40)
LDL Cholesterol: 56 mg/dL (ref <130)
Total CHOL/HDL Ratio: 3.2 Ratio (ref <=5.0)
Triglycerides: 61 mg/dL (ref <150)
VLDL: 12 mg/dL (ref <30)

## 2015-04-26 ENCOUNTER — Other Ambulatory Visit: Payer: Self-pay | Admitting: Cardiovascular Disease

## 2015-04-26 NOTE — Telephone Encounter (Signed)
Rx(s) sent to pharmacy electronically. OV 10/20

## 2015-05-03 ENCOUNTER — Ambulatory Visit (INDEPENDENT_AMBULATORY_CARE_PROVIDER_SITE_OTHER): Payer: Medicare PPO | Admitting: Cardiovascular Disease

## 2015-05-03 ENCOUNTER — Encounter: Payer: Self-pay | Admitting: Cardiovascular Disease

## 2015-05-03 VITALS — BP 110/68 | HR 70 | Resp 16 | Ht 69.0 in | Wt 161.0 lb

## 2015-05-03 DIAGNOSIS — F17200 Nicotine dependence, unspecified, uncomplicated: Secondary | ICD-10-CM

## 2015-05-03 DIAGNOSIS — I2510 Atherosclerotic heart disease of native coronary artery without angina pectoris: Secondary | ICD-10-CM

## 2015-05-03 DIAGNOSIS — R739 Hyperglycemia, unspecified: Secondary | ICD-10-CM

## 2015-05-03 DIAGNOSIS — I252 Old myocardial infarction: Secondary | ICD-10-CM | POA: Diagnosis not present

## 2015-05-03 DIAGNOSIS — Z720 Tobacco use: Secondary | ICD-10-CM

## 2015-05-03 MED ORDER — ATORVASTATIN CALCIUM 10 MG PO TABS: 10.0000 mg | ORAL_TABLET | Freq: Every day | ORAL | Status: DC

## 2015-05-03 MED ORDER — COENZYME Q10 30 MG PO CAPS: 30.0000 mg | ORAL_CAPSULE | Freq: Every day | ORAL | Status: DC

## 2015-05-03 NOTE — Patient Instructions (Signed)
Your physician has recommended you make the following change in your medication: DECREASE ATORVASTATIN TO '10MG'$  DAILY.  START CO Q 10 OVER THE COUNTER '300MG'$  DAILY.  Dr. Sallyanne Kuster recommends that you schedule a follow-up appointment in: ONE YEAR.

## 2015-05-03 NOTE — Progress Notes (Signed)
Patient ID: [name], male   DOB: 04/04/1951, 64 y.o.   MRN: [id number]     Cardiology Office Note   Date:  05/04/2015   ID:  [name], DOB 1951/05/29, MRN [id number]  PCP:  David Fraction, MD  Cardiologist:   David Klein, MD   Chief Complaint  Patient presents with  . Annual Exam      History of Present Illness: [name] is a 64 y.o. male who presents for CAD.  [name] has not had any angina pectoris. He has not been able to quit smoking. He used a vapor device for a while but then returned to cigarette smoking. He still smokes more than a pack of cigarettes a day. He denies any change in his chronic dyspnea. Year ago he had severe back pain suggestive of a vertebral body compression fracture and subsequent evaluation has confirmed that he has osteoporosis. He has a variety of problems with muscle cramps, all those these seem to be getting a little better on magnesium supplements.  [name] is now 64 years old and it's been roughly 10 years since he received 2 drug-eluting stents to the right coronary artery and left circumflex coronary artery. He has had an inferior wall myocardial infarction. His most recent nuclear stress test did not show any ischemia in February 2011. It was reported as showing diaphragmatic attenuation artifact, but I believe it truly shows an inferior scar. His left ventricular ejection Greer was borderline at 51%. Should be noted that when he initially presented with coronary problems in 2005 his LV EF was 40% with a severely hypokinetic inferior wall. He wears an orthosis on his right knee. He has a lifelong history of heavy smoking.  Past Medical History  Diagnosis Date  . Myocardial infarction ([place])   . Depression   . Anxiety   . Panic attacks   . CAD (coronary artery disease)   . COPD (chronic obstructive pulmonary disease) ([place])   . Smoker   . Osteopenia     Past Surgical History  Procedure Laterality Date  . Coronary stent  placement  2005    RCA & CX  . Back surgery  12/24/2000    L5,S1  . Inguinal hernia repair  12/1978    right side  . Nm myocar perf wall motion  09/07/2009    No ischemia; EF 51%  . Hernia repair Right 1980's  . Spine surgery  2002    L5-S1  . Shoulder surgery Left 08/2010     Current Outpatient Prescriptions  Medication Sig Dispense Refill  . alendronate (FOSAMAX) 70 MG tablet Take 1 tablet (70 mg total) by mouth every 7 (seven) days. Take with a full glass of water on an empty stomach. 4 tablet 11  . ALPRAZolam (XANAX) 1 MG tablet Take 1 mg by mouth 4 (four) times daily.    Marland Kitchen aspirin EC 81 MG tablet Take 81 mg by mouth at bedtime.    Marland Kitchen atorvastatin (LIPITOR) 10 MG tablet Take 1 tablet (10 mg total) by mouth at bedtime. 90 tablet 3  . magnesium citrate SOLN Take 296 mLs (1 Bottle total) by mouth once. 195 mL 0  . magnesium oxide (MAG-OX) 400 MG tablet Take 400 mg by mouth daily.    . traZODone (DESYREL) 100 MG tablet Take 400 mg by mouth at bedtime.    Marland Kitchen venlafaxine (EFFEXOR) 75 MG tablet Take 150 mg by mouth 2 (two) times daily.     Marland Kitchen co-enzyme Q-10  30 MG capsule Take 1 capsule (30 mg total) by mouth daily. 90 capsule 3   No current facility-administered medications for this visit.    Allergies:   Codeine and Niaspan    Social History:  The patient  reports that he has been smoking Cigarettes.  He has been smoking about 1.00 pack per day. He has never used smokeless tobacco. He reports that he does not drink alcohol or use illicit drugs.   Family History:  The patient's family history includes Alcohol abuse in his brother; Cancer in his brother; Diabetes in his brother and sister; Heart attack in his father and mother; Heart failure in his mother; Kidney disease in his father.    ROS:  Please see the history of present illness.    Otherwise, review of systems positive for none.   All other systems are reviewed and negative.    PHYSICAL EXAM: VS:  BP 110/68 mmHg  Pulse 70   Resp 16  Ht '5\' 9"'$  (1.753 m)  Wt 161 lb (73.029 kg)  BMI 23.76 kg/m2 , BMI Body mass index is 23.76 kg/(m^2).  General: Alert, oriented x3, no distress  Head: no evidence of trauma, PERRL, EOMI, no exophtalmos or lid lag, no myxedema, no xanthelasma; normal ears, nose and oropharynx  Neck: normal jugular venous pulsations and no hepatojugular reflux; brisk carotid pulses without delay and no carotid bruits  Chest: clear to auscultation, no signs of consolidation by percussion or palpation, normal fremitus, symmetrical and full respiratory excursions  Cardiovascular: normal position and quality of the apical impulse, regular rhythm, normal first and second heart sounds, no murmurs, rubs or gallops  Abdomen: no tenderness or distention, no masses by palpation, no abnormal pulsatility or arterial bruits, normal bowel sounds, no hepatosplenomegaly  Extremities: He has bilateral finger clubbing, but no cyanosis or edema; 2+ radial, ulnar and brachial pulses bilaterally; 2+ right femoral, posterior tibial and dorsalis pedis pulses; 2+ left femoral, posterior tibial and dorsalis pedis pulses; no subclavian or femoral bruits. He wears an orthosis on his right knee  Neurological: grossly nonfocal Psych: euthymic mood, full affect   EKG:  EKG is ordered today. The ekg ordered today demonstrates normal sinus rhythm, normal tracing   Recent Labs: 01/30/2015: ALT 13; BUN 13; Creat 0.95; Hemoglobin 14.3; Magnesium 2.4; Platelets 213; Potassium 4.2; Sodium 136    Lipid Panel    Component Value Date/Time   CHOL 99* 04/18/2015 1441   TRIG 61 04/18/2015 1441   HDL 31* 04/18/2015 1441   CHOLHDL 3.2 04/18/2015 1441   VLDL 12 04/18/2015 1441   LDLCALC 56 04/18/2015 1441      Wt Readings from Last 3 Encounters:  05/03/15 161 lb (73.029 kg)  03/23/15 162 lb (73.483 kg)  01/30/15 160 lb (72.576 kg)   .   ASSESSMENT AND PLAN:  1. Coronary artery disease status post inferior myocardial  infarction and previous stents to the right coronary artery, without subsequent events in over 10 years. Focus remains on risk factor prevention, especially smoking cessation and lipid lowering.  2. Hyperlipidemia -  over the years has lost a lot of weight and his lipid profile is excellent if not excessively treated. I wonder if his muscle cramps may be related to statin myopathy. Have asked him to start taking coenzyme every 10 and to reduce the Lipitor dose to only 10 mg daily  3. Tobacco abuse - this is a serious problem. I am convinced that he has COPD and he has very  obvious clubbing. So far no signs of lung neoplasm. I also told him that I'm sure that his severe osteoporosis, out of proportion for his age, has to do at least in part with smoking. He is strongly encouraged to cut back on cigarettes and quit altogether. He states that he understands the importance, but he has found it extremely hard to quit. We discussed various methods to help with smoking cessation.    Current medicines are reviewed at length with the patient today.  The patient does not have concerns regarding medicines.  The following changes have been made:  Reduce atorvastatin to 10 mg daily and start coenzyme Q10 300 mg daily  Labs/ tests ordered today include:  Orders Placed This Encounter  Procedures  . EKG 12-Lead    Patient Instructions  Your physician has recommended you make the following change in your medication: DECREASE ATORVASTATIN TO '10MG'$  DAILY.  START CO Q 10 OVER THE COUNTER '300MG'$  DAILY.  Dr. Sallyanne Kuster recommends that you schedule a follow-up appointment in: ONE YEAR.      Mikael Spray, MD  05/04/2015 6:40 PM    David Klein, MD, [facility] [phone] office [phone] pager

## 2015-05-10 ENCOUNTER — Other Ambulatory Visit: Payer: Self-pay

## 2015-05-10 MED ORDER — ATORVASTATIN CALCIUM 10 MG PO TABS: 10.0000 mg | ORAL_TABLET | Freq: Every day | ORAL | Status: DC

## 2015-05-11 ENCOUNTER — Telehealth: Payer: Self-pay | Admitting: Cardiovascular Disease

## 2015-05-11 NOTE — Telephone Encounter (Signed)
Cash is calling to clarification on Atorvastatin '10mg'$  and the '20mg'$  . Please call at [phone].. If need please fax over the clarification to [phone]..   Thanks

## 2015-05-11 NOTE — Telephone Encounter (Signed)
Spoke with a Emergency planning/management officer, and asked her to discontinue Atorvastatin '20mg'$ . Patient is aware that he should bo on 10 mg, and to call [facility] when he needs a refill.

## 2015-08-21 ENCOUNTER — Telehealth: Payer: Self-pay | Admitting: Family Medicine

## 2015-08-21 MED ORDER — TRAMADOL HCL 50 MG PO TABS: 50.0000 mg | ORAL_TABLET | Freq: Four times a day (QID) | ORAL | Status: DC | PRN

## 2015-08-21 NOTE — Telephone Encounter (Signed)
Pt said he had chest xray 3-4 years ago????  Took Rx for pain med and made appt for Thursday so he can discuss his rib pain with provider.

## 2015-08-21 NOTE — Telephone Encounter (Signed)
Pt says he has been being seen for this for years.  Something to do with his bone density!!  Just wants something for rib pain???

## 2015-08-21 NOTE — Telephone Encounter (Signed)
PATIENT HAS VERY SORE RIBS AND WOULD LIKE TO KNOW IF HE COULD GET SOMETHING FOR HIS PAIN IF POSSIBLE WOULD [place] A CALL BACK REGARDING THIS  [phone]

## 2015-08-21 NOTE — Telephone Encounter (Signed)
I have no recollection of rib pain in any of our visits.  I am aware he has osteopenia but that doesn't cause rib pain.  Need xray of chest to rule out pathology.  Can have tramadol 50 mg poq 6 hrs prn pain until xray returns.

## 2015-08-23 ENCOUNTER — Encounter: Payer: Self-pay | Admitting: Family Medicine

## 2015-08-23 ENCOUNTER — Ambulatory Visit (INDEPENDENT_AMBULATORY_CARE_PROVIDER_SITE_OTHER): Payer: Medicare PPO | Admitting: Family Medicine

## 2015-08-23 VITALS — BP 126/68 | HR 60 | Temp 97.4°F | Resp 18 | Ht 69.0 in | Wt 166.0 lb

## 2015-08-23 DIAGNOSIS — M549 Dorsalgia, unspecified: Secondary | ICD-10-CM | POA: Diagnosis not present

## 2015-08-23 DIAGNOSIS — G8929 Other chronic pain: Secondary | ICD-10-CM

## 2015-08-23 DIAGNOSIS — M5489 Other dorsalgia: Secondary | ICD-10-CM

## 2015-08-23 DIAGNOSIS — M546 Pain in thoracic spine: Principal | ICD-10-CM

## 2015-08-23 LAB — CBC WITH DIFFERENTIAL/PLATELET
Basophils Absolute: 0.1 10*3/uL (ref 0.0–0.1)
Basophils Relative: 1 % (ref 0–1)
Eosinophils Absolute: 0.2 10*3/uL (ref 0.0–0.7)
Eosinophils Relative: 3 % (ref 0–5)
HCT: 42.2 % (ref 39.0–52.0)
Hemoglobin: 14.2 g/dL (ref 13.0–17.0)
Lymphocytes Relative: 32 % (ref 12–46)
Lymphs Abs: 2.1 10*3/uL (ref 0.7–4.0)
MCH: 30.1 pg (ref 26.0–34.0)
MCHC: 33.6 g/dL (ref 30.0–36.0)
MCV: 89.4 fL (ref 78.0–100.0)
MPV: 9.9 fL (ref 8.6–12.4)
Monocytes Absolute: 0.5 10*3/uL (ref 0.1–1.0)
Monocytes Relative: 7 % (ref 3–12)
Neutro Abs: 3.7 10*3/uL (ref 1.7–7.7)
Neutrophils Relative %: 57 % (ref 43–77)
Platelets: 199 10*3/uL (ref 150–400)
RBC: 4.72 MIL/uL (ref 4.22–5.81)
RDW: 16.3 % — ABNORMAL HIGH (ref 11.5–15.5)
WBC: 6.5 10*3/uL (ref 4.0–10.5)

## 2015-08-23 LAB — COMPLETE METABOLIC PANEL WITH GFR
ALT: 9 U/L (ref 9–46)
AST: 15 U/L (ref 10–35)
Albumin: 3.6 g/dL (ref 3.6–5.1)
Alkaline Phosphatase: 61 U/L (ref 40–115)
BUN: 13 mg/dL (ref 7–25)
CO2: 23 mmol/L (ref 20–31)
Calcium: 8.7 mg/dL (ref 8.6–10.3)
Chloride: 106 mmol/L (ref 98–110)
Creat: 0.99 mg/dL (ref 0.70–1.25)
GFR, Est African American: >89 mL/min (ref >=60)
GFR, Est Non African American: 80 mL/min (ref >=60)
Glucose, Bld: 97 mg/dL (ref 70–99)
Potassium: 4.3 mmol/L (ref 3.5–5.3)
Sodium: 136 mmol/L (ref 135–146)
Total Bilirubin: 0.4 mg/dL (ref 0.2–1.2)
Total Protein: 6.7 g/dL (ref 6.1–8.1)

## 2015-08-23 MED ORDER — CYCLOBENZAPRINE HCL 10 MG PO TABS: 10.0000 mg | ORAL_TABLET | Freq: Three times a day (TID) | ORAL | Status: DC | PRN

## 2015-08-23 NOTE — Progress Notes (Signed)
Subjective:    Patient ID: [name], male    DOB: 1951/02/08, 65 y.o.   MRN: [id number]  HPI  patient presents today complaining of rib pain. He states that he has had rib pain for 5 years.  When I asked him to show where he is hurting and what he considers to be rib pain, the patient points to the muscles in his back from his midthoracic paraspinal muscles all the way down to his lumbar paraspinal muscles. He denies any pleurisy. He denies any hemoptysis. He denies any chest pain. He has had normal rib x-rays in 2011. He had a normal chest x-ray in 2015. The pain comes and goes but tends to be exacerbated by movement. It sounds more muscular in nature. He denies any sciatica or numbness or tingling in his legs. He denies any hematuria or dysuria or frequency or urgency Past Medical History  Diagnosis Date  . Myocardial infarction ([place])   . Depression   . Anxiety   . Panic attacks   . CAD (coronary artery disease)   . COPD (chronic obstructive pulmonary disease) ([place])   . Smoker   . Osteopenia    Past Surgical History  Procedure Laterality Date  . Coronary stent placement  2005    RCA & CX  . Back surgery  12/24/2000    L5,S1  . Inguinal hernia repair  12/1978    right side  . Nm myocar perf wall motion  09/07/2009    No ischemia; EF 51%  . Hernia repair Right 1980's  . Spine surgery  2002    L5-S1  . Shoulder surgery Left 08/2010   Current Outpatient Prescriptions on File Prior to Visit  Medication Sig Dispense Refill  . alendronate (FOSAMAX) 70 MG tablet Take 1 tablet (70 mg total) by mouth every 7 (seven) days. Take with a full glass of water on an empty stomach. 4 tablet 11  . ALPRAZolam (XANAX) 1 MG tablet Take 1 mg by mouth 4 (four) times daily.    Marland Kitchen aspirin EC 81 MG tablet Take 81 mg by mouth at bedtime.    Marland Kitchen atorvastatin (LIPITOR) 10 MG tablet Take 1 tablet (10 mg total) by mouth at bedtime. 90 tablet 3  . co-enzyme Q-10 30 MG capsule Take 1 capsule (30 mg total) by  mouth daily. 90 capsule 3  . magnesium oxide (MAG-OX) 400 MG tablet Take 400 mg by mouth daily.    . traMADol (ULTRAM) 50 MG tablet Take 1 tablet (50 mg total) by mouth every 6 (six) hours as needed. 20 tablet 0  . traZODone (DESYREL) 100 MG tablet Take 400 mg by mouth at bedtime.    Marland Kitchen venlafaxine (EFFEXOR) 75 MG tablet Take 150 mg by mouth 2 (two) times daily.      No current facility-administered medications on file prior to visit.   Allergies  Allergen Reactions  . Codeine Nausea Only  . Niaspan Durene Cal Er]    Social History   Social History  . Marital Status: Single    Spouse Name: N/A  . Number of Children: N/A  . Years of Education: N/A   Occupational History  . Not on file.   Social History Main Topics  . Smoking status: Current Every Day Smoker -- 1.00 packs/day    Types: Cigarettes  . Smokeless tobacco: Never Used  . Alcohol Use: No  . Drug Use: No  . Sexual Activity: Not on file   Other Topics Concern  .  Not on file   Social History Narrative      Review of Systems  All other systems reviewed and are negative.      Objective:   Physical Exam  Constitutional: He appears well-developed and well-nourished.  Cardiovascular: Normal rate, regular rhythm and normal heart sounds.   Pulmonary/Chest: Effort normal and breath sounds normal.  Abdominal: Soft. Bowel sounds are normal.  Musculoskeletal: He exhibits no edema.       Thoracic back: He exhibits decreased range of motion, tenderness and pain. He exhibits no bony tenderness and no spasm.       Lumbar back: He exhibits decreased range of motion, tenderness, pain and spasm. He exhibits no bony tenderness.  Neurological: He exhibits normal muscle tone.  Vitals reviewed.  patient does have a brace on his right leg        Assessment & Plan:  Mid back pain, chronic - Plan: DG Thoracic Spine W/Swimmers, DG Lumbar Spine Complete, cyclobenzaprine (FLEXERIL) 10 MG tablet, CBC with Differential/Platelet,  COMPLETE METABOLIC PANEL WITH GFR   the pain is not actually located in the patient's ribs. The pain is actually in the lumbar and thoracic paraspinal muscles. I believe he is having chronic musculoskeletal pain. I would like to obtain an x-ray of his thoracic and lumbar spine to rule out bony pathology such as degenerative disc disease or possible bone metastasis from a cancer. I would also obtain a CBC to evaluate for any bone marrow abnormalities. If CBC is normal and  X-ray showed no abnormalities, I will treat the patient symptomatically with Flexeril 10 mg every 8 hours as needed and tramadol sparingly for breakthrough pain.

## 2015-08-24 ENCOUNTER — Encounter: Payer: Self-pay | Admitting: Family Medicine

## 2015-08-24 DIAGNOSIS — F3342 Major depressive disorder, recurrent, in full remission: Secondary | ICD-10-CM | POA: Diagnosis not present

## 2015-09-04 ENCOUNTER — Ambulatory Visit (HOSPITAL_COMMUNITY)
Admission: RE | Admit: 2015-09-04 | Discharge: 2015-09-04 | Disposition: A | Discharge status: Home / Self Care | Payer: Medicare PPO | Source: Ambulatory Visit | Attending: Family Medicine | Admitting: Family Medicine

## 2015-09-04 DIAGNOSIS — M8580 Other specified disorders of bone density and structure, unspecified site: Secondary | ICD-10-CM | POA: Diagnosis not present

## 2015-09-04 DIAGNOSIS — M546 Pain in thoracic spine: Secondary | ICD-10-CM

## 2015-09-04 DIAGNOSIS — M47897 Other spondylosis, lumbosacral region: Secondary | ICD-10-CM | POA: Diagnosis not present

## 2015-09-04 DIAGNOSIS — G8929 Other chronic pain: Secondary | ICD-10-CM | POA: Diagnosis not present

## 2015-09-04 DIAGNOSIS — M5489 Other dorsalgia: Secondary | ICD-10-CM | POA: Insufficient documentation

## 2015-09-04 DIAGNOSIS — M5137 Other intervertebral disc degeneration, lumbosacral region: Secondary | ICD-10-CM | POA: Diagnosis not present

## 2015-09-04 DIAGNOSIS — M47814 Spondylosis without myelopathy or radiculopathy, thoracic region: Secondary | ICD-10-CM | POA: Diagnosis not present

## 2015-09-04 DIAGNOSIS — M5136 Other intervertebral disc degeneration, lumbar region: Secondary | ICD-10-CM | POA: Diagnosis not present

## 2015-09-20 ENCOUNTER — Telehealth: Payer: Self-pay | Admitting: Family Medicine

## 2015-09-20 DIAGNOSIS — M549 Dorsalgia, unspecified: Secondary | ICD-10-CM

## 2015-09-20 DIAGNOSIS — G8929 Other chronic pain: Secondary | ICD-10-CM

## 2015-09-20 DIAGNOSIS — M546 Pain in thoracic spine: Principal | ICD-10-CM

## 2015-09-20 MED ORDER — CYCLOBENZAPRINE HCL 10 MG PO TABS: 10.0000 mg | ORAL_TABLET | Freq: Three times a day (TID) | ORAL | Status: DC | PRN

## 2015-09-20 NOTE — Telephone Encounter (Signed)
?   OK to Refill  

## 2015-09-20 NOTE — Telephone Encounter (Signed)
Medication called/sent to requested pharmacy  

## 2015-09-20 NOTE — Telephone Encounter (Signed)
ok 

## 2015-09-20 NOTE — Telephone Encounter (Signed)
Patient requesting refill on his flexeril 90 day supply if possible  [phone] [place]

## 2015-12-20 ENCOUNTER — Other Ambulatory Visit: Payer: Self-pay | Admitting: Family Medicine

## 2015-12-20 NOTE — Telephone Encounter (Signed)
Prescription sent to pharmacy.

## 2015-12-20 NOTE — Telephone Encounter (Signed)
ok 

## 2015-12-20 NOTE — Telephone Encounter (Signed)
Ok to refill 

## 2016-01-21 DIAGNOSIS — F3342 Major depressive disorder, recurrent, in full remission: Secondary | ICD-10-CM | POA: Diagnosis not present

## 2016-03-18 ENCOUNTER — Other Ambulatory Visit: Payer: Self-pay | Admitting: Family Medicine

## 2016-03-18 ENCOUNTER — Ambulatory Visit (INDEPENDENT_AMBULATORY_CARE_PROVIDER_SITE_OTHER): Payer: Medicare PPO | Admitting: Family Medicine

## 2016-03-18 ENCOUNTER — Encounter: Payer: Self-pay | Admitting: Family Medicine

## 2016-03-18 VITALS — BP 130/68 | HR 60 | Temp 97.7°F | Resp 18 | Ht 69.0 in | Wt 164.0 lb

## 2016-03-18 DIAGNOSIS — L304 Erythema intertrigo: Secondary | ICD-10-CM | POA: Diagnosis not present

## 2016-03-18 DIAGNOSIS — R3911 Hesitancy of micturition: Secondary | ICD-10-CM | POA: Diagnosis not present

## 2016-03-18 DIAGNOSIS — M25552 Pain in left hip: Secondary | ICD-10-CM

## 2016-03-18 LAB — PSA: PSA: 0.9 ng/mL (ref <=4.0)

## 2016-03-18 MED ORDER — CLOTRIMAZOLE-BETAMETHASONE 1-0.05 % EX CREA: 1.0000 "application " | TOPICAL_CREAM | Freq: Two times a day (BID) | CUTANEOUS | 0 refills | Status: DC

## 2016-03-18 NOTE — Telephone Encounter (Signed)
ok 

## 2016-03-18 NOTE — Progress Notes (Signed)
Subjective:    Patient ID: [name], male    DOB: [birth year], 65 y.o.   MRN: [id number]  HPI  08/2015 patient presents today complaining of rib pain. He states that he has had rib pain for 5 years.  When I asked him to show where he is hurting and what he considers to be rib pain, the patient points to the muscles in his back from his midthoracic paraspinal muscles all the way down to his lumbar paraspinal muscles. He denies any pleurisy. He denies any hemoptysis. He denies any chest pain. He has had normal rib x-rays in 2011. He had a normal chest x-ray in 2015. The pain comes and goes but tends to be exacerbated by movement. It sounds more muscular in nature. He denies any sciatica or numbness or tingling in his legs. He denies any hematuria or dysuria or frequency or urgency.  At that time, my plan was:  the pain is not actually located in the patient's ribs. The pain is actually in the lumbar and thoracic paraspinal muscles. I believe he is having chronic musculoskeletal pain. I would like to obtain an x-ray of his thoracic and lumbar spine to rule out bony pathology such as degenerative disc disease or possible bone metastasis from a cancer. I would also obtain a CBC to evaluate for any bone marrow abnormalities. If CBC is normal and  X-ray showed no abnormalities, I will treat the patient symptomatically with Flexeril 10 mg every 8 hours as needed and tramadol sparingly for breakthrough pain.  03/18/16 He presents with a rash in his groin on the medial surface of his thighs bilaterally right greater than left and also on his scrotum. The rash is an erythematous patch with a serpiginous border with fine white scale consistent with Candida intertrigo versus tinea cruris.  He has had it for 3 weeks. It does itch. He also complains of ed.  he also reports decreased urinary stream and also urinary hesitancy. He does experience 2-3 episodes of nocturia per night. He is overdue for a PSA here he is  due for his next bone density in one year. He continues to complain of pain in both ribs. X-rays last year revealed mild degenerative disc disease in his thoracic spine, osteopenia, and degenerative disc disease at L5-S1 Flexeril seems to help the pain Past Medical History:  Diagnosis Date  . Anxiety   . CAD (coronary artery disease)   . COPD (chronic obstructive pulmonary disease) ([place])   . Depression   . Myocardial infarction ([place])   . Osteopenia   . Panic attacks   . Smoker    Past Surgical History:  Procedure Laterality Date  . BACK SURGERY  12/24/2000   L5,S1  . CORONARY STENT PLACEMENT  2005   RCA & CX  . HERNIA REPAIR Right 1980's  . INGUINAL HERNIA REPAIR  12/1978   right side  . NM MYOCAR PERF WALL MOTION  09/07/2009   No ischemia; EF 51%  . SHOULDER SURGERY Left 08/2010  . SPINE SURGERY  2002   L5-S1   Current Outpatient Prescriptions on File Prior to Visit  Medication Sig Dispense Refill  . alendronate (FOSAMAX) 70 MG tablet Take 1 tablet (70 mg total) by mouth every 7 (seven) days. Take with a full glass of water on an empty stomach. 4 tablet 11  . ALPRAZolam (XANAX) 1 MG tablet Take 1 mg by mouth 4 (four) times daily.    Marland Kitchen aspirin EC 81 MG  tablet Take 81 mg by mouth at bedtime.    Marland Kitchen atorvastatin (LIPITOR) 10 MG tablet Take 1 tablet (10 mg total) by mouth at bedtime. 90 tablet 3  . co-enzyme Q-10 30 MG capsule Take 1 capsule (30 mg total) by mouth daily. 90 capsule 3  . cyclobenzaprine (FLEXERIL) 10 MG tablet TAKE 1 TABLET THREE TIMES DAILY AS NEEDED FOR MUSCLE SPASMS. 270 tablet 0  . magnesium oxide (MAG-OX) 400 MG tablet Take 400 mg by mouth daily.    . traMADol (ULTRAM) 50 MG tablet Take 1 tablet (50 mg total) by mouth every 6 (six) hours as needed. 20 tablet 0  . traZODone (DESYREL) 100 MG tablet Take 400 mg by mouth at bedtime.    Marland Kitchen venlafaxine (EFFEXOR) 75 MG tablet Take 150 mg by mouth 2 (two) times daily.      No current facility-administered medications on  file prior to visit.    Allergies  Allergen Reactions  . Codeine Nausea Only  . Niaspan Durene Cal Er]    Social History   Social History  . Marital status: Single    Spouse name: N/A  . Number of children: N/A  . Years of education: N/A   Occupational History  . Not on file.   Social History Main Topics  . Smoking status: Current Every Day Smoker    Packs/day: 1.00    Types: Cigarettes  . Smokeless tobacco: Never Used  . Alcohol use No  . Drug use: No  . Sexual activity: Not on file   Other Topics Concern  . Not on file   Social History Narrative  . No narrative on file      Review of Systems  All other systems reviewed and are negative.      Objective:   Physical Exam  Constitutional: He appears well-developed and well-nourished.  Cardiovascular: Normal rate, regular rhythm and normal heart sounds.   Pulmonary/Chest: Effort normal and breath sounds normal.  Abdominal: Soft. Bowel sounds are normal.  Musculoskeletal: He exhibits no edema.       Thoracic back: He exhibits decreased range of motion, tenderness and pain. He exhibits no bony tenderness and no spasm.       Lumbar back: He exhibits decreased range of motion, tenderness, pain and spasm. He exhibits no bony tenderness.  Neurological: He exhibits normal muscle tone.  Skin: Rash noted. There is erythema.  Vitals reviewed.  patient does have a brace on his right leg        Assessment & Plan:  Intertrigo - Plan: clotrimazole-betamethasone (LOTRISONE) cream  Urinary hesitancy - Plan: PSA Lotrisone twice daily for 2 weeks. If no better consider Diflucan. Gave the patient samples of Viagra 50-100 mg by mouth daily when necessary. I will check a PSA to rule out prostate cancer. Consider Flomax for urinary hesitancy. Continue Flexeril as needed for muscle spasms in his ribs and in his back. Recheck bone density in one year

## 2016-03-18 NOTE — Telephone Encounter (Signed)
Ok to refill 

## 2016-03-19 ENCOUNTER — Ambulatory Visit (HOSPITAL_COMMUNITY)
Admission: RE | Admit: 2016-03-19 | Discharge: 2016-03-19 | Disposition: A | Discharge status: Home / Self Care | Payer: Medicare PPO | Source: Ambulatory Visit | Attending: Family Medicine | Admitting: Family Medicine

## 2016-03-19 DIAGNOSIS — M25552 Pain in left hip: Secondary | ICD-10-CM | POA: Diagnosis not present

## 2016-03-24 ENCOUNTER — Other Ambulatory Visit: Payer: Self-pay | Admitting: Family Medicine

## 2016-03-24 DIAGNOSIS — M25552 Pain in left hip: Secondary | ICD-10-CM

## 2016-04-24 ENCOUNTER — Encounter (HOSPITAL_COMMUNITY): Payer: Self-pay | Admitting: Physical Therapy

## 2016-04-24 ENCOUNTER — Ambulatory Visit (HOSPITAL_COMMUNITY): Payer: Medicare PPO | Attending: Family Medicine | Admitting: Physical Therapy

## 2016-04-24 DIAGNOSIS — M62838 Other muscle spasm: Secondary | ICD-10-CM

## 2016-04-24 DIAGNOSIS — G8929 Other chronic pain: Secondary | ICD-10-CM

## 2016-04-24 DIAGNOSIS — M6281 Muscle weakness (generalized): Secondary | ICD-10-CM

## 2016-04-24 DIAGNOSIS — M545 Low back pain: Secondary | ICD-10-CM | POA: Insufficient documentation

## 2016-04-24 DIAGNOSIS — M25552 Pain in left hip: Secondary | ICD-10-CM | POA: Diagnosis not present

## 2016-04-24 DIAGNOSIS — R2681 Unsteadiness on feet: Secondary | ICD-10-CM

## 2016-04-24 DIAGNOSIS — R2689 Other abnormalities of gait and mobility: Secondary | ICD-10-CM

## 2016-04-24 NOTE — Therapy (Signed)
[facility] [address] [place], [place], [address] Phone: [phone]   Fax:  [phone]  Physical Therapy Evaluation  Patient Details  Name: [name] MRN: [id number] Date of Birth: Nov 17, 1950 Referring Provider: Jenna Luo, MD  Encounter Date: 04/24/2016      PT End of Session - 04/24/16 1809    Visit Number 1   Number of Visits 12   Date for PT Re-Evaluation 05/15/16   Authorization Type [employer] medicare    Authorization Time Period 04/24/16 to 06/05/16   PT Start Time 1348   PT Stop Time 1430   PT Time Calculation (min) 42 min   Activity Tolerance Patient tolerated treatment well   Behavior During Therapy [facility] for tasks assessed/performed      Past Medical History:  Diagnosis Date  . Anxiety   . CAD (coronary artery disease)   . COPD (chronic obstructive pulmonary disease) ([place])   . Depression   . Myocardial infarction   . Osteopenia   . Panic attacks   . Smoker     Past Surgical History:  Procedure Laterality Date  . BACK SURGERY  12/24/2000   L5,S1  . CORONARY STENT PLACEMENT  2005   RCA & CX  . HERNIA REPAIR Right 1980's  . INGUINAL HERNIA REPAIR  12/1978   right side  . NM MYOCAR PERF WALL MOTION  09/07/2009   No ischemia; EF 51%  . SHOULDER SURGERY Left 08/2010  . SPINE SURGERY  2002   L5-S1    There were no vitals filed for this visit.       Subjective Assessment - 04/24/16 1352    Subjective Pt reports Lt hip pain that has been bothering him for ~6 months. He noticed it when he was walking and it seemed to come out of nowhere. His PCP did imaging and sent him to PT.    Pertinent History anxiety, CAD, COPD, depression, osteopenia, back surgery L5/S1   Limitations Walking;Other (comment)  up steps    How long can you sit comfortably? only limited from his back, hip usually alleviates    How long can you stand comfortably? 20 minutes    How long can you walk comfortably? 1/2 block   Diagnostic tests xray: negative for fracture   Patient Stated Goals able to walk up/down steps   Currently in Pain? Yes   Pain Score 2   worst: 7/10   Pain Location Hip   Pain Orientation Left   Pain Descriptors / Indicators Sharp   Pain Type Chronic pain   Pain Radiating Towards none    Pain Onset More than a month ago   Pain Frequency Intermittent   Aggravating Factors  walking, stairs    Pain Relieving Factors resting    Effect of Pain on Daily Activities limited activity tolerance             [facility] PT Assessment - 04/24/16 0001      Assessment   Medical Diagnosis Lt hip pain   Referring Provider Jenna Luo, MD   Onset Date/Surgical Date --  ~6 months ago    Next MD Visit none scheduled   Prior Therapy none for this issue     Precautions   Precautions None     Restrictions   Weight Bearing Restrictions No     Balance Screen   Has the patient fallen in the past 6 months No   Has the patient had a decrease in activity  level because of a fear of falling?  No   Is the patient reluctant to leave their home because of a fear of falling?  No     Home Social worker Private residence   Home Access Stairs to enter  1 STE     Prior Function   Level of Independence Independent     Cognition   Overall Cognitive Status Within Functional Limits for tasks assessed   Behaviors Other (comment)  pt requiring redirection to tasks      Observation/Other Assessments   Observations pt wearing homemade KO     Sensation   Light Touch Appears Intact     Posture/Postural Control   Posture/Postural Control Postural limitations   Postural Limitations Forward head;Rounded Shoulders;Decreased lumbar lordosis;Increased thoracic kyphosis     ROM / Strength   AROM / PROM / Strength AROM;Strength     AROM   AROM Assessment Site Lumbar   Lumbar Flexion WNL, pain reported Lt hip    Lumbar Extension limited, unable to reach neutral, increased pain back and  Lt hip      Strength   Strength Assessment Site Hip;Knee;Ankle   Right/Left Hip Right;Left   Right Hip Flexion 4+/5   Right Hip Extension 4/5   Right Hip ABduction 4/5   Left Hip Flexion 4+/5   Left Hip Extension 4/5   Right/Left Knee Right;Left   Right Knee Flexion 4-/5   Right Knee Extension 3+/5   Left Knee Flexion 5/5   Left Knee Extension 5/5   Right/Left Ankle Right;Left   Right Ankle Dorsiflexion 4+/5   Left Ankle Dorsiflexion 5/5     Palpation   SI assessment  (-) SI testing    Palpation comment TTP, trigger points along Lt TFL, piriformis     Special Tests    Special Tests Hip Special Tests;Lumbar   Lumbar Tests FABER test;Slump Test;Straight Leg Raise;Prone Knee Bend Test   Hip Special Tests  Marcello Moores Test;SI Compression;SI Distraction;Anterior Hip Impingement Test;Piriformis Test;Patrick (FABER) Test     FABER test   findings Negative     Slump test   Findings Negative     Prone Knee Bend Test   Findings Negative     Straight Leg Raise   Findings Negative     Saralyn Pilar (FABER) Test   Findings Negative     Thomas Test    Findings Positive   Side Right;Left     SI Compression   Findings Negative     SI Distraction   Findings  Negative     Anterior Hip Impingement Test    Findings Negative     Piriformis Test   Findings Positive   Side  Left     Transfers   Comments Pt with RLE KO locked in extension to transition sit to/from stand      Ambulation/Gait   Ambulation/Gait Yes   Ambulation/Gait Assistance 6: Modified independent (Device/Increase time)   Ambulation Distance (Feet) 225 Feet   Assistive device None   Gait Pattern Right circumduction;Right hip hike;Decreased weight shift to right;Decreased hip/knee flexion - right;Decreased step length - left;Trunk flexed   Ambulation Surface Level                   [facility] Adult PT Treatment/Exercise - 04/24/16 0001      Exercises   Exercises Lumbar     Lumbar Exercises: Standing    Other Standing Lumbar Exercises REIS x10 reps, no reported change in symptoms  PT Education - 04/24/16 1808    Education provided Yes   Education Details eval findings/POC; possibility of pain coming from compensations of RLE weakness during gait/other activity   Person(s) Educated Patient   Methods Explanation   Comprehension Verbalized understanding          PT Short Term Goals - 04/24/16 1821      PT SHORT TERM GOAL #1   Title Pt will demo consistency and independence with his HEP to improve strength and mobility.   Time 2   Period Weeks   Status New     PT SHORT TERM GOAL #2   Title Pt will report no greater than 5/10 pain with activity to improve his tolerance to stair negotiation.   Time 2   Period Weeks   Status New     PT SHORT TERM GOAL #3   Title Pt will demo correct set up and use of lumbar roll, without cues from therapist, to aid in correcting his sitting posture to decrease pain.    Time 2   Period Weeks   Status New           PT Long Term Goals - 04/24/16 1823      PT LONG TERM GOAL #1   Title Pt will demo improved LLE strength to 5/5 MMT and RLE to 4/5 MMT to assist with functional activity such as ambulation and stair negotiation.    Time 6   Period Weeks   Status New     PT LONG TERM GOAL #2   Title Pt will demo improved postural awareness evident by his ability to self correct posture atleast 50% of the session without cues from therapist.    Time 6   Period Weeks   Status New     PT LONG TERM GOAL #3   Title Pt will report no more than 3/10 pain during ambulation after 10 minutes, to allow him to improve his overall fitness.    Time 6   Period Weeks   Status New     PT LONG TERM GOAL #4   Title pt will report no more than minimal tenderness with palpation throughout his Lt hip musculature (piriformis, TFL), to improve his sitting tolerance.    Time 6   Period Weeks   Status New               Plan -  04/24/16 1810    Clinical Impression Statement Pt is a 65yo M referred to OPPT for evaluation of Lt hip pain onset insidiously several months ago. He presents to clinic wearing Rt KO which remains locked in extension during ambulation, and reports Lt hip pain along his gluteal region. All special testing for back involvement was negative, as was SI testing. Pt is tender with palpation along Lt glute max/piriformis/TFL and pain was only reproduced with palpation and ambulation/stair negotiation. Pt presents with gait deviations secondary RLE weakness and use of KO, which is likely contributing to his pain. I discussed this with the pt as well possibility of attaining orthotics evaluation to improve his gait mechanics. Pt would benefit from skilled PT to address his limitations and improve his activity tolerance and quality of life.    Rehab Potential Fair   Clinical Impairments Affecting Rehab Potential pre-existing RLE weakness impairing gait mechanics    PT Frequency 2x / week   PT Duration 6 weeks   PT Treatment/Interventions ADLs/Self Care Home Management;Moist Heat;Therapeutic activities;Stair training;Functional mobility training;Gait training;Balance training;Neuromuscular  re-education;Patient/family education;Orthotic Fit/Training;Manual techniques;Passive range of motion;Dry needling   PT Next Visit Plan (provide HEP: trunk stabilization ab set, bent knee raise, piriformis stretch, gentle lumbar extension); trunk strength/stability progression, postural strengthening, LE flexibility and manual techniques to address muscle spasm/soft tissue restrictions    PT Home Exercise Plan unable to provide initial visit due to time constraints.   Recommended Other Services possible orthotics evaluation    Consulted and Agree with Plan of Care Patient      Patient will benefit from skilled therapeutic intervention in order to improve the following deficits and impairments:  Abnormal gait, Decreased activity  tolerance, Decreased balance, Decreased endurance, Decreased mobility, Decreased range of motion, Decreased strength, Difficulty walking, Impaired flexibility, Postural dysfunction, Pain, Improper body mechanics, Hypomobility, Increased muscle spasms  Visit Diagnosis: Pain in left hip  Chronic low back pain, unspecified back pain laterality, with sciatica presence unspecified  Other muscle spasm  Other abnormalities of gait and mobility  Unsteadiness on feet  Muscle weakness (generalized)      G-Codes - 05/01/2016 1815    Functional Assessment Tool Used clinical judgement based on assessment of ROM, strength and mobility    Functional Limitation Mobility: Walking and moving around   Mobility: Walking and Moving Around Current Status [phone]) At least 40 percent but less than 60 percent impaired, limited or restricted   Mobility: Walking and Moving Around Goal Status [phone]) At least 40 percent but less than 60 percent impaired, limited or restricted       Problem List Patient Active Problem List   Diagnosis Date Noted  . Osteopenia 03/30/2015  . Clubbing of fingers 04/14/2014  . Hyperglycemia 10/20/2013  . Smoker   . Closed dislocation of acromioclavicular joint 04/11/2010  . BREAST MASS, LEFT 01/20/2008  . ONYCHOMYCOSIS, TOENAILS 10/20/2007  . MUSCLE CRAMPS 10/20/2007  . Hypercholesterolemia 05/28/2007  . CATARACT NOS 08/28/2006  . DISTURBANCE, VISUAL NOS 08/19/2006  . COPD (chronic obstructive pulmonary disease) with emphysema ([place]) 08/19/2006  . WITHDRAWAL, DRUG 06/24/2006  . ANXIETY 06/24/2006  . DEPRESSION 06/24/2006  . MYOCARDIAL INFARCTION, HX OF 06/24/2006  . Coronary atherosclerosis 06/24/2006  . CARDIAC ARRHYTHMIA 06/24/2006  . CONSTIPATION 06/24/2006  . OSTEOARTHRITIS 06/24/2006  . LOW BACK PAIN 06/24/2006  . INSOMNIA 06/24/2006    6:34 PM,05-01-2016 Elly Modena PT, DPT [facility] [address] [place], [place], [address] Phone: [phone]   Fax:  [phone]  Name: HEBERTO STURDEVANT MRN: [id number] Date of Birth: Nov 22, 1950

## 2016-05-05 ENCOUNTER — Encounter: Payer: Self-pay | Admitting: Cardiovascular Disease

## 2016-05-05 ENCOUNTER — Ambulatory Visit (INDEPENDENT_AMBULATORY_CARE_PROVIDER_SITE_OTHER): Payer: Medicare PPO | Admitting: Cardiovascular Disease

## 2016-05-05 VITALS — BP 118/62 | HR 62 | Ht 69.0 in | Wt 161.5 lb

## 2016-05-05 DIAGNOSIS — F17200 Nicotine dependence, unspecified, uncomplicated: Secondary | ICD-10-CM

## 2016-05-05 DIAGNOSIS — Z79899 Other long term (current) drug therapy: Secondary | ICD-10-CM

## 2016-05-05 DIAGNOSIS — E785 Hyperlipidemia, unspecified: Secondary | ICD-10-CM

## 2016-05-05 DIAGNOSIS — I2510 Atherosclerotic heart disease of native coronary artery without angina pectoris: Secondary | ICD-10-CM

## 2016-05-05 DIAGNOSIS — R9431 Abnormal electrocardiogram [ECG] [EKG]: Secondary | ICD-10-CM

## 2016-05-05 NOTE — Progress Notes (Signed)
Cardiology Office Note    Date:  05/06/2016   ID:  [name], DOB Jun 22, 1951, MRN [id number]  PCP:  Odette Fraction, MD  Cardiologist:   Sanda Klein, MD   Chief Complaint  Patient presents with  . Follow-up    no chest pain, has bad cramps in legs    History of Present Illness:  [name] is a 65 y.o. male with coronary artery disease, COPD, previous myocardial infarction, ongoing tobacco use presenting for routine follow-up.  He has not had problems with angina pectoris since his last appointment. Has chronic shortness of breath, functional class II. Heart assay with her this is from COPD or CAD. He is smoking about 10 cigarettes a day, with a long-term plan to eventually quit. He has chronic problems severe back pain and wears an orthosis on his right knee.  In 2005 he received 2 drug-eluting stents the right coronary artery and the left circumflex coronary artery. He has evidence of an inferior wall myocardial infarction with Q waves in leads 2, 3, aVF and the scar in the inferior wall on nuclear stress testing (reported as diaphragmatic attenuation, but probably a true scar). Has an ejection fraction estimated at about 50%. He has not required coronary angiography since his initial presentation in 2005.  Past Medical History:  Diagnosis Date  . Anxiety   . CAD (coronary artery disease)   . COPD (chronic obstructive pulmonary disease) ([place])   . Depression   . Myocardial infarction   . Osteopenia   . Panic attacks   . Smoker     Past Surgical History:  Procedure Laterality Date  . BACK SURGERY  12/24/2000   L5,S1  . CORONARY STENT PLACEMENT  2005   RCA & CX  . HERNIA REPAIR Right 1980's  . INGUINAL HERNIA REPAIR  12/1978   right side  . NM MYOCAR PERF WALL MOTION  09/07/2009   No ischemia; EF 51%  . SHOULDER SURGERY Left 08/2010  . SPINE SURGERY  2002   L5-S1    Current Medications: Outpatient Medications Prior to Visit  Medication Sig Dispense  Refill  . alendronate (FOSAMAX) 70 MG tablet Take 1 tablet (70 mg total) by mouth every 7 (seven) days. Take with a full glass of water on an empty stomach. 4 tablet 11  . ALPRAZolam (XANAX) 1 MG tablet Take 1 mg by mouth 4 (four) times daily.    Marland Kitchen aspirin EC 81 MG tablet Take 81 mg by mouth at bedtime.    Marland Kitchen atorvastatin (LIPITOR) 10 MG tablet Take 1 tablet (10 mg total) by mouth at bedtime. 90 tablet 3  . clotrimazole-betamethasone (LOTRISONE) cream Apply 1 application topically 2 (two) times daily. 30 g 0  . co-enzyme Q-10 30 MG capsule Take 1 capsule (30 mg total) by mouth daily. 90 capsule 3  . cyclobenzaprine (FLEXERIL) 10 MG tablet TAKE 1 TABLET THREE TIMES DAILY AS NEEDED FOR MUSCLE SPASMS. 270 tablet 1  . magnesium oxide (MAG-OX) 400 MG tablet Take 400 mg by mouth daily.    . traZODone (DESYREL) 100 MG tablet Take 400 mg by mouth at bedtime.    Marland Kitchen venlafaxine (EFFEXOR) 75 MG tablet Take 150 mg by mouth 2 (two) times daily.     . traMADol (ULTRAM) 50 MG tablet Take 1 tablet (50 mg total) by mouth every 6 (six) hours as needed. (Patient not taking: Reported on 05/05/2016) 20 tablet 0   No facility-administered medications prior to visit.  Allergies:   Codeine and Niaspan [niacin er]   Social History   Social History  . Marital status: Single    Spouse name: N/A  . Number of children: N/A  . Years of education: N/A   Social History Main Topics  . Smoking status: Current Every Day Smoker    Packs/day: 1.00    Types: Cigarettes  . Smokeless tobacco: Never Used  . Alcohol use No  . Drug use: No  . Sexual activity: Not Asked   Other Topics Concern  . None   Social History Narrative  . None     Family History:  The patient's family history includes Alcohol abuse in his brother; Cancer in his brother; Diabetes in his brother and sister; Heart attack in his father and mother; Heart failure in his mother; Kidney disease in his father.   ROS:   Please see the history of  present illness.    ROS All other systems reviewed and are negative.   PHYSICAL EXAM:   VS:  BP 118/62 (BP Location: Left Arm, Patient Position: Sitting, Cuff Size: Normal)   Pulse 62   Ht '5\' 9"'$  (1.753 m)   Wt 161 lb 8 oz (73.3 kg)   BMI 23.85 kg/m    GEN: Well nourished, well developed, in no acute distress  HEENT: normal  Neck: no JVD, carotid bruits, or masses Cardiac: RRR; no murmurs, rubs, or gallops,no edema  Respiratory:  clear to auscultation bilaterally, normal work of breathing GI: soft, nontender, nondistended, + BS MS: no deformity or atrophy  Skin: warm and dry, no rash Neuro:  Alert and Oriented x 3, Strength and sensation are intact Psych: euthymic mood, full affect  Wt Readings from Last 3 Encounters:  05/05/16 161 lb 8 oz (73.3 kg)  03/18/16 164 lb (74.4 kg)  08/23/15 166 lb (75.3 kg)      Studies/Labs Reviewed:   EKG:  EKG is ordered today.  The ekg ordered today demonstrates Normal sinus rhythm, Q waves in leads 2, 3 and aVF. Voltage criteria for LVH probably related to his slender body habitus. No ST-T-T-segment changes. QTC 470 ms  Recent Labs: 08/23/2015: ALT 9; BUN 13; Creat 0.99; Hemoglobin 14.2; Platelets 199; Potassium 4.3; Sodium 136   Lipid Panel    Component Value Date/Time   CHOL 99 (L) 04/18/2015 1441   TRIG 61 04/18/2015 1441   HDL 31 (L) 04/18/2015 1441   CHOLHDL 3.2 04/18/2015 1441   VLDL 12 04/18/2015 1441   LDLCALC 56 04/18/2015 1441     ASSESSMENT:    1. Atherosclerosis of native coronary artery of native heart without angina pectoris   2. Dyslipidemia   3. Smoking   4. Long QT interval   5. Medication management      No other signs to suggest ischemia. May be related to treatment with trazodone.   In order of problems listed above:  1. CAD: No angina pectoris, asymptomatic. Has mild left ventricular dysfunction from previous inferior wall infarction. On treatment with aspirin, statin. Does not have angina. Beta blockers  avoided due to chronic lung disease. 2. HLP: LDL cholesterol in target range. Relatively low HDL. Time to reevaluate. 3. Smoking/COPD: Smoking cessation strongly recommended. He did use a vapor device for a while, but has never really quit smoking. Smoking is also contributing to his problems with advanced osteoporosis. 4. Long QT: Mild, possibly related to trazodone therapy. Monitor. Avoid other QT prolonging agents    Medication Adjustments/Labs and Tests Ordered:  Current medicines are reviewed at length with the patient today.  Concerns regarding medicines are outlined above.  Medication changes, Labs and Tests ordered today are listed in the Patient Instructions below. Patient Instructions  Dr Sallyanne Kuster recommends that you continue on your current medications as directed. Please refer to the Current Medication list given to you today.  Your physician recommends that you return for lab work at your earliest [place].  Dr Sallyanne Kuster recommends that you schedule a follow-up appointment in 1 year. You will receive a reminder letter in the mail two months in advance. If you don't receive a letter, please call our office to schedule the follow-up appointment.  If you need a refill on your cardiac medications before your next appointment, please call your pharmacy.    Signed, Sanda Klein, MD  05/06/2016 4:51 PM    [facility] [place], [place], [place]  [address] Phone: [phone]; Fax: [phone]

## 2016-05-05 NOTE — Patient Instructions (Signed)
Dr Sallyanne Kuster recommends that you continue on your current medications as directed. Please refer to the Current Medication list given to you today.  Your physician recommends that you return for lab work at your earliest [place].  Dr Sallyanne Kuster recommends that you schedule a follow-up appointment in 1 year. You will receive a reminder letter in the mail two months in advance. If you don't receive a letter, please call our office to schedule the follow-up appointment.  If you need a refill on your cardiac medications before your next appointment, please call your pharmacy.

## 2016-05-06 ENCOUNTER — Ambulatory Visit (HOSPITAL_COMMUNITY): Payer: Medicare PPO | Admitting: Physical Therapy

## 2016-05-06 DIAGNOSIS — M545 Low back pain: Secondary | ICD-10-CM | POA: Diagnosis not present

## 2016-05-06 DIAGNOSIS — G8929 Other chronic pain: Secondary | ICD-10-CM | POA: Diagnosis not present

## 2016-05-06 DIAGNOSIS — R2681 Unsteadiness on feet: Secondary | ICD-10-CM | POA: Diagnosis not present

## 2016-05-06 DIAGNOSIS — M25552 Pain in left hip: Secondary | ICD-10-CM | POA: Diagnosis not present

## 2016-05-06 DIAGNOSIS — R2689 Other abnormalities of gait and mobility: Secondary | ICD-10-CM | POA: Diagnosis not present

## 2016-05-06 DIAGNOSIS — M62838 Other muscle spasm: Secondary | ICD-10-CM

## 2016-05-06 DIAGNOSIS — M6281 Muscle weakness (generalized): Secondary | ICD-10-CM

## 2016-05-06 NOTE — Therapy (Signed)
[facility] [address] [place], [place], [address] Phone: [phone]   Fax:  [phone]  Physical Therapy Treatment  Patient Details  Name: [name] MRN: [id number] Date of Birth: 08-May-1951 Referring Provider: Jenna Luo, MD  Encounter Date: 05/06/2016      PT End of Session - 05/06/16 1438    Visit Number 2   Number of Visits 6   Date for PT Re-Evaluation 05/15/16   Authorization Type [employer] medicare    Authorization Time Period 04/24/16 to 06/05/16   Authorization - Visit Number 2   Authorization - Number of Visits 6   PT Start Time 6010   PT Stop Time 1345   PT Time Calculation (min) 41 min   Activity Tolerance Patient tolerated treatment well;No increased pain   Behavior During Therapy WFL for tasks assessed/performed      Past Medical History:  Diagnosis Date  . Anxiety   . CAD (coronary artery disease)   . COPD (chronic obstructive pulmonary disease) ([place])   . Depression   . Myocardial infarction   . Osteopenia   . Panic attacks   . Smoker     Past Surgical History:  Procedure Laterality Date  . BACK SURGERY  12/24/2000   L5,S1  . CORONARY STENT PLACEMENT  2005   RCA & CX  . HERNIA REPAIR Right 1980's  . INGUINAL HERNIA REPAIR  12/1978   right side  . NM MYOCAR PERF WALL MOTION  09/07/2009   No ischemia; EF 51%  . SHOULDER SURGERY Left 08/2010  . SPINE SURGERY  2002   L5-S1    There were no vitals filed for this visit.      Subjective Assessment - 05/06/16 1312    Subjective Pt reports he has been trying to walk better at home, but his Lt hip seems to be more sore. He has no other complaints.    Pertinent History anxiety, CAD, COPD, depression, osteopenia, back surgery L5/S1   Limitations Walking;Other (comment)  up steps    How long can you sit comfortably? only limited from his back, hip usually alleviates    How long can you stand comfortably? 20 minutes    How long can you walk  comfortably? 1/2 block   Diagnostic tests xray: negative for fracture   Patient Stated Goals able to walk up/down steps   Currently in Pain? Yes   Pain Score 4    Pain Location Hip   Pain Orientation Left   Pain Descriptors / Indicators Sharp   Pain Type Chronic pain   Pain Radiating Towards none    Pain Onset More than a month ago   Pain Frequency Intermittent   Aggravating Factors  walking and stairs   Pain Relieving Factors resting, massage    Effect of Pain on Daily Activities limited activity tolerance                          [facility] Adult PT Treatment/Exercise - 05/06/16 0001      Lumbar Exercises: Stretches   Single Knee to Chest Stretch 10 seconds   Single Knee to Chest Stretch Limitations x10 reps each LE    Lower Trunk Rotation Other (comment)   Lower Trunk Rotation Limitations x15 reps, supine    Quad Stretch 3 reps;30 seconds   Quad Stretch Limitations prone    Piriformis Stretch 3 reps;30 seconds   Piriformis Stretch Limitations BLE  Lumbar Exercises: Supine   Ab Set 10 reps;3 seconds   AB Set Limitations verbal/tactile cues for technique      Manual Therapy   Manual Therapy Soft tissue mobilization   Manual therapy comments separate rest of session    Soft tissue mobilization TrP release Lt piriformis, STM Lt glute                 PT Education - 05/06/16 1437    Education provided Yes   Education Details initiated HEP; technique with therex; importance of deep abdominal strength during activity to protect the spine    Person(s) Educated Patient   Methods Explanation;Verbal cues;Tactile cues;Demonstration   Comprehension Verbalized understanding          PT Short Term Goals - 04/24/16 1821      PT SHORT TERM GOAL #1   Title Pt will demo consistency and independence with his HEP to improve strength and mobility.   Time 2   Period Weeks   Status New     PT SHORT TERM GOAL #2   Title Pt will report no greater than 5/10  pain with activity to improve his tolerance to stair negotiation.   Time 2   Period Weeks   Status New     PT SHORT TERM GOAL #3   Title Pt will demo correct set up and use of lumbar roll, without cues from therapist, to aid in correcting his sitting posture to decrease pain.    Time 2   Period Weeks   Status New           PT Long Term Goals - 04/24/16 1823      PT LONG TERM GOAL #1   Title Pt will demo improved LLE strength to 5/5 MMT and RLE to 4/5 MMT to assist with functional activity such as ambulation and stair negotiation.    Time 6   Period Weeks   Status New     PT LONG TERM GOAL #2   Title Pt will demo improved postural awareness evident by his ability to self correct posture atleast 50% of the session without cues from therapist.    Time 6   Period Weeks   Status New     PT LONG TERM GOAL #3   Title Pt will report no more than 3/10 pain during ambulation after 10 minutes, to allow him to improve his overall fitness.    Time 6   Period Weeks   Status New     PT LONG TERM GOAL #4   Title pt will report no more than minimal tenderness with palpation throughout his Lt hip musculature (piriformis, TFL), to improve his sitting tolerance.    Time 6   Period Weeks   Status New               Plan - 05/06/16 1439    Clinical Impression Statement Pt was aprroved for 6 visits and at this time is being decreased to 1x/week to ensure we are making proper adjustments to his HEP and monitoring progress during sessions. He continues to present with Lt hip muscle pain secondary to poor mechanics during ambulation and other activity with his Rt knee locked in extension. Introduced several exercises to improve muscle length and relaxation with pt reporting some improvement in muscle tightness following today's session. Will continue with current POC.    Rehab Potential Fair   Clinical Impairments Affecting Rehab Potential pre-existing RLE weakness impairing gait mechanics  PT Frequency 2x / week   PT Duration 6 weeks   PT Treatment/Interventions ADLs/Self Care Home Management;Moist Heat;Therapeutic activities;Stair training;Functional mobility training;Gait training;Balance training;Neuromuscular re-education;Patient/family education;Orthotic Fit/Training;Manual techniques;Passive range of motion;Dry needling   PT Next Visit Plan trunk strength/stability progression, postural strengthening, LE flexibility and manual techniques to address muscle spasm/soft tissue restrictions    PT Home Exercise Plan ab set, SKTC, quad stretch    Consulted and Agree with Plan of Care Patient      Patient will benefit from skilled therapeutic intervention in order to improve the following deficits and impairments:  Abnormal gait, Decreased activity tolerance, Decreased balance, Decreased endurance, Decreased mobility, Decreased range of motion, Decreased strength, Difficulty walking, Impaired flexibility, Postural dysfunction, Pain, Improper body mechanics, Hypomobility, Increased muscle spasms  Visit Diagnosis: Pain in left hip  Chronic low back pain, unspecified back pain laterality, with sciatica presence unspecified  Other muscle spasm  Unsteadiness on feet  Other abnormalities of gait and mobility  Muscle weakness (generalized)     Problem List Patient Active Problem List   Diagnosis Date Noted  . Osteopenia 03/30/2015  . Clubbing of fingers 04/14/2014  . Hyperglycemia 10/20/2013  . Smoker   . Closed dislocation of acromioclavicular joint 04/11/2010  . BREAST MASS, LEFT 01/20/2008  . ONYCHOMYCOSIS, TOENAILS 10/20/2007  . MUSCLE CRAMPS 10/20/2007  . Hypercholesterolemia 05/28/2007  . CATARACT NOS 08/28/2006  . DISTURBANCE, VISUAL NOS 08/19/2006  . COPD (chronic obstructive pulmonary disease) with emphysema ([place]) 08/19/2006  . WITHDRAWAL, DRUG 06/24/2006  . ANXIETY 06/24/2006  . DEPRESSION 06/24/2006  . MYOCARDIAL INFARCTION, HX OF 06/24/2006  .  Coronary atherosclerosis 06/24/2006  . CARDIAC ARRHYTHMIA 06/24/2006  . CONSTIPATION 06/24/2006  . OSTEOARTHRITIS 06/24/2006  . LOW BACK PAIN 06/24/2006  . INSOMNIA 06/24/2006    2:43 PM,05/06/16 Elly Modena PT, DPT [facility] [address] [place], [place], [address] Phone: [phone]   Fax:  [phone]  Name: [name] MRN: [id number] Date of Birth: [birth year]

## 2016-05-08 ENCOUNTER — Ambulatory Visit (HOSPITAL_COMMUNITY): Payer: Medicare PPO | Admitting: Physical Therapy

## 2016-05-13 ENCOUNTER — Ambulatory Visit (HOSPITAL_COMMUNITY): Payer: Medicare PPO | Admitting: Physical Therapy

## 2016-05-13 DIAGNOSIS — M25552 Pain in left hip: Secondary | ICD-10-CM | POA: Diagnosis not present

## 2016-05-13 DIAGNOSIS — M545 Low back pain: Secondary | ICD-10-CM

## 2016-05-13 DIAGNOSIS — M6281 Muscle weakness (generalized): Secondary | ICD-10-CM | POA: Diagnosis not present

## 2016-05-13 DIAGNOSIS — G8929 Other chronic pain: Secondary | ICD-10-CM

## 2016-05-13 DIAGNOSIS — M62838 Other muscle spasm: Secondary | ICD-10-CM | POA: Diagnosis not present

## 2016-05-13 DIAGNOSIS — R2681 Unsteadiness on feet: Secondary | ICD-10-CM | POA: Diagnosis not present

## 2016-05-13 DIAGNOSIS — R2689 Other abnormalities of gait and mobility: Secondary | ICD-10-CM

## 2016-05-13 NOTE — Therapy (Signed)
[facility] [place], [place], [address] Phone: [phone]   Fax:  [phone]  Physical Therapy Treatment  Patient Details  Name: [name] MRN: [id number] Date of Birth: 05-01-1951 Referring Provider: Jenna Luo, MD  Encounter Date: 05/13/2016      PT End of Session - 05/13/16 1327    Visit Number 3   Number of Visits 6   Date for PT Re-Evaluation 05/15/16   Authorization Type [employer] medicare    Authorization Time Period 04/24/16 to 06/05/16   Authorization - Visit Number 3   Authorization - Number of Visits 6   PT Start Time 2542   PT Stop Time 1350   PT Time Calculation (min) 48 min   Activity Tolerance Patient tolerated treatment well;No increased pain   Behavior During Therapy WFL for tasks assessed/performed      Past Medical History:  Diagnosis Date  . Anxiety   . CAD (coronary artery disease)   . COPD (chronic obstructive pulmonary disease) ([place])   . Depression   . Myocardial infarction   . Osteopenia   . Panic attacks   . Smoker     Past Surgical History:  Procedure Laterality Date  . BACK SURGERY  12/24/2000   L5,S1  . CORONARY STENT PLACEMENT  2005   RCA & CX  . HERNIA REPAIR Right 1980's  . INGUINAL HERNIA REPAIR  12/1978   right side  . NM MYOCAR PERF WALL MOTION  09/07/2009   No ischemia; EF 51%  . SHOULDER SURGERY Left 08/2010  . SPINE SURGERY  2002   L5-S1    There were no vitals filed for this visit.      Subjective Assessment - 05/13/16 1304    Subjective Pt reports things are good. His his is a little sore today. He had to go up and down steps alot this past weekend which irritated his hip.    Pertinent History anxiety, CAD, COPD, depression, osteopenia, back surgery L5/S1   Limitations Walking;Other (comment)  up steps    How long can you sit comfortably? only limited from his back, hip usually alleviates    How long can you stand comfortably? 20 minutes    How  long can you walk comfortably? 1/2 block   Diagnostic tests xray: negative for fracture   Patient Stated Goals able to walk up/down steps   Pain Onset More than a month ago                         [facility] Adult PT Treatment/Exercise - 05/13/16 0001      Lumbar Exercises: Standing   Other Standing Lumbar Exercises standing weight shift Lt/Rt without knee brace, CGA x3 min.     Lumbar Exercises: Supine   Bridge Other (comment)   Bridge Limitations attempted, pt unable without UE assistance   Straight Leg Raise 20 reps   Straight Leg Raises Limitations BLE    Other Supine Lumbar Exercises isometric hip extension into table 15x5 sec hold   Other Supine Lumbar Exercises lower trunk rotation x20 reps      Lumbar Exercises: Sidelying   Clam 15 reps   Clam Limitations green TB             PT Education - 05/13/16 1323    Education provided Yes   Education Details importance of using a knee brace that will allow him to bend his knee for better clearance  during ambulation; encouraged increased use of RLE during activity to improve strength and overall balance; discussed brace options and provided several contacts of clinics in the area for pt to call for pricing   Person(s) Educated Patient   Methods Explanation   Comprehension Verbalized understanding          PT Short Term Goals - 04/24/16 1821      PT SHORT TERM GOAL #1   Title Pt will demo consistency and independence with his HEP to improve strength and mobility.   Time 2   Period Weeks   Status New     PT SHORT TERM GOAL #2   Title Pt will report no greater than 5/10 pain with activity to improve his tolerance to stair negotiation.   Time 2   Period Weeks   Status New     PT SHORT TERM GOAL #3   Title Pt will demo correct set up and use of lumbar roll, without cues from therapist, to aid in correcting his sitting posture to decrease pain.    Time 2   Period Weeks   Status New           PT  Long Term Goals - 04/24/16 1823      PT LONG TERM GOAL #1   Title Pt will demo improved LLE strength to 5/5 MMT and RLE to 4/5 MMT to assist with functional activity such as ambulation and stair negotiation.    Time 6   Period Weeks   Status New     PT LONG TERM GOAL #2   Title Pt will demo improved postural awareness evident by his ability to self correct posture atleast 50% of the session without cues from therapist.    Time 6   Period Weeks   Status New     PT LONG TERM GOAL #3   Title Pt will report no more than 3/10 pain during ambulation after 10 minutes, to allow him to improve his overall fitness.    Time 6   Period Weeks   Status New     PT LONG TERM GOAL #4   Title pt will report no more than minimal tenderness with palpation throughout his Lt hip musculature (piriformis, TFL), to improve his sitting tolerance.    Time 6   Period Weeks   Status New               Plan - 05/13/16 1328    Clinical Impression Statement Pt continues to present with Lt hip pain aggravated by his mechanics during functional tasks with RLE locked in extension. MMT of RLE reveals overall good strength of Rt quad, however pt with poor ability to use RLE during sit to stand and standing activity. Attempted standing weight shift activity requiring CGA and maximal verbal cues to improve standing posture and confidence in his LE. Pt would benefit from a knee brace that will allow him to flex his knee during ambulation and other tasks, to decrease the demand of his Lt hip musculature.    Rehab Potential Fair   Clinical Impairments Affecting Rehab Potential pre-existing RLE weakness impairing gait mechanics    PT Frequency 2x / week   PT Duration 6 weeks   PT Treatment/Interventions ADLs/Self Care Home Management;Moist Heat;Therapeutic activities;Stair training;Functional mobility training;Gait training;Balance training;Neuromuscular re-education;Patient/family education;Orthotic  Fit/Training;Manual techniques;Passive range of motion;Dry needling   PT Next Visit Plan trunk strength/stability progression, postural strengthening, LE flexibility and manual techniques to address muscle spasm/soft tissue restrictions  PT Home Exercise Plan ab set, SKTC, quad stretch, clamshell, LE extension, SLR   Consulted and Agree with Plan of Care Patient      Patient will benefit from skilled therapeutic intervention in order to improve the following deficits and impairments:  Abnormal gait, Decreased activity tolerance, Decreased balance, Decreased endurance, Decreased mobility, Decreased range of motion, Decreased strength, Difficulty walking, Impaired flexibility, Postural dysfunction, Pain, Improper body mechanics, Hypomobility, Increased muscle spasms  Visit Diagnosis: Pain in left hip  Chronic low back pain, unspecified back pain laterality, with sciatica presence unspecified  Other muscle spasm  Unsteadiness on feet  Other abnormalities of gait and mobility  Muscle weakness (generalized)     Problem List Patient Active Problem List   Diagnosis Date Noted  . Osteopenia 03/30/2015  . Clubbing of fingers 04/14/2014  . Hyperglycemia 10/20/2013  . Smoker   . Closed dislocation of acromioclavicular joint 04/11/2010  . BREAST MASS, LEFT 01/20/2008  . ONYCHOMYCOSIS, TOENAILS 10/20/2007  . MUSCLE CRAMPS 10/20/2007  . Hypercholesterolemia 05/28/2007  . CATARACT NOS 08/28/2006  . DISTURBANCE, VISUAL NOS 08/19/2006  . COPD (chronic obstructive pulmonary disease) with emphysema ([place]) 08/19/2006  . WITHDRAWAL, DRUG 06/24/2006  . ANXIETY 06/24/2006  . DEPRESSION 06/24/2006  . MYOCARDIAL INFARCTION, HX OF 06/24/2006  . Coronary atherosclerosis 06/24/2006  . CARDIAC ARRHYTHMIA 06/24/2006  . CONSTIPATION 06/24/2006  . OSTEOARTHRITIS 06/24/2006  . LOW BACK PAIN 06/24/2006  . INSOMNIA 06/24/2006    2:01 PM,05/13/16 Elly Modena PT, DPT [facility] [address] [place], [place], [address] Phone: [phone]   Fax:  [phone]  Name: TAHSIN BENYO MRN: [id number] Date of Birth: May 31, 1951

## 2016-05-15 ENCOUNTER — Encounter (HOSPITAL_COMMUNITY): Payer: Medicare PPO | Admitting: Physical Therapy

## 2016-05-20 ENCOUNTER — Ambulatory Visit (HOSPITAL_COMMUNITY): Payer: Medicare PPO | Attending: Family Medicine | Admitting: Physical Therapy

## 2016-05-20 DIAGNOSIS — M25552 Pain in left hip: Secondary | ICD-10-CM | POA: Diagnosis not present

## 2016-05-20 DIAGNOSIS — R2689 Other abnormalities of gait and mobility: Secondary | ICD-10-CM | POA: Diagnosis not present

## 2016-05-20 DIAGNOSIS — R2681 Unsteadiness on feet: Secondary | ICD-10-CM | POA: Diagnosis not present

## 2016-05-20 DIAGNOSIS — M545 Low back pain: Secondary | ICD-10-CM | POA: Diagnosis not present

## 2016-05-20 DIAGNOSIS — G8929 Other chronic pain: Secondary | ICD-10-CM | POA: Diagnosis not present

## 2016-05-20 DIAGNOSIS — M6281 Muscle weakness (generalized): Secondary | ICD-10-CM | POA: Diagnosis not present

## 2016-05-20 DIAGNOSIS — M62838 Other muscle spasm: Secondary | ICD-10-CM

## 2016-05-20 NOTE — Therapy (Signed)
[facility] [address] [place], [place], [address] Phone: [phone]   Fax:  [phone]  Physical Therapy Treatment  Patient Details  Name: [name] MRN: [id number] Date of Birth: [birth year] Referring Provider: Jenna Luo, MD  Encounter Date: 05/20/2016      PT End of Session - 05/20/16 1512    Visit Number 4   Number of Visits 6   Date for PT Re-Evaluation 05/15/16   Authorization Type [employer] medicare    Authorization Time Period 04/24/16 to 06/05/16   Authorization - Visit Number 4   Authorization - Number of Visits 6   PT Start Time 3335   PT Stop Time 4562   PT Time Calculation (min) 40 min   Activity Tolerance Patient tolerated treatment well;No increased pain   Behavior During Therapy WFL for tasks assessed/performed      Past Medical History:  Diagnosis Date  . Anxiety   . CAD (coronary artery disease)   . COPD (chronic obstructive pulmonary disease) ([place])   . Depression   . Myocardial infarction   . Osteopenia   . Panic attacks   . Smoker     Past Surgical History:  Procedure Laterality Date  . BACK SURGERY  12/24/2000   L5,S1  . CORONARY STENT PLACEMENT  2005   RCA & CX  . HERNIA REPAIR Right 1980's  . INGUINAL HERNIA REPAIR  12/1978   right side  . NM MYOCAR PERF WALL MOTION  09/07/2009   No ischemia; EF 51%  . SHOULDER SURGERY Left 08/2010  . SPINE SURGERY  2002   L5-S1    There were no vitals filed for this visit.      Subjective Assessment - 05/20/16 1506    Subjective Pt states his back and Lt hip hurts constantly at 8/10. but doesn't take meds because he is afraid if he has less pain he will over do it.     Currently in Pain? Yes   Pain Score 8    Pain Location Hip   Pain Orientation Left   Pain Type Chronic pain                         [facility] Adult PT Treatment/Exercise - 05/20/16 0001      Lumbar Exercises: Stretches   Single Knee to Chest Stretch 10  seconds   Single Knee to Chest Stretch Limitations x10 reps each LE      Lumbar Exercises: Standing   Other Standing Lumbar Exercises side stepping in front of mat 2RT wtihout brace or AD     Lumbar Exercises: Seated   Sit to Stand 5 reps   Sit to Stand Limitations no UE equal WB without brace     Lumbar Exercises: Supine   Bridge 10 reps   Bridge Limitations minimal clearance of hips   Straight Leg Raise 20 reps   Straight Leg Raises Limitations BLE      Lumbar Exercises: Sidelying   Hip Abduction 10 reps  B LE     Lumbar Exercises: Prone   Straight Leg Raise 10 reps   Other Prone Lumbar Exercises hamstring curls 10 reps                PT Education - 05/20/16 1513    Education provided Yes   Education Details reiterated last session education by evaluating therapist regarding need to get a different brace that allows flexion with ambulation.  Pt is to follow up with MD to get a specific prescription.   Person(s) Educated Patient   Methods Explanation   Comprehension Verbalized understanding          PT Short Term Goals - 04/24/16 1821      PT SHORT TERM GOAL #1   Title Pt will demo consistency and independence with his HEP to improve strength and mobility.   Time 2   Period Weeks   Status New     PT SHORT TERM GOAL #2   Title Pt will report no greater than 5/10 pain with activity to improve his tolerance to stair negotiation.   Time 2   Period Weeks   Status New     PT SHORT TERM GOAL #3   Title Pt will demo correct set up and use of lumbar roll, without cues from therapist, to aid in correcting his sitting posture to decrease pain.    Time 2   Period Weeks   Status New           PT Long Term Goals - 04/24/16 1823      PT LONG TERM GOAL #1   Title Pt will demo improved LLE strength to 5/5 MMT and RLE to 4/5 MMT to assist with functional activity such as ambulation and stair negotiation.    Time 6   Period Weeks   Status New     PT LONG  TERM GOAL #2   Title Pt will demo improved postural awareness evident by his ability to self correct posture atleast 50% of the session without cues from therapist.    Time 6   Period Weeks   Status New     PT LONG TERM GOAL #3   Title Pt will report no more than 3/10 pain during ambulation after 10 minutes, to allow him to improve his overall fitness.    Time 6   Period Weeks   Status New     PT LONG TERM GOAL #4   Title pt will report no more than minimal tenderness with palpation throughout his Lt hip musculature (piriformis, TFL), to improve his sitting tolerance.    Time 6   Period Weeks   Status New               Plan - 05/20/16 1515    Clinical Impression Statement Focus continued on general LE strengthening and promoting weight bearing through Rt LE.  Pt able to complete LAQ and SLR without extension lag, however will not weight bear through Rt LE when trying to stand and exaggerated buckling of knee when attempts weight bearing.  Evaluating therapist to follow up with MD communication regarding brace.    Rehab Potential Fair   Clinical Impairments Affecting Rehab Potential pre-existing RLE weakness impairing gait mechanics    PT Frequency 2x / week   PT Duration 6 weeks   PT Treatment/Interventions ADLs/Self Care Home Management;Moist Heat;Therapeutic activities;Stair training;Functional mobility training;Gait training;Balance training;Neuromuscular re-education;Patient/family education;Orthotic Fit/Training;Manual techniques;Passive range of motion;Dry needling   PT Next Visit Plan trunk strength/stability progression, postural strengthening, LE flexibility and manual techniques to address muscle spasm/soft tissue restrictions    PT Home Exercise Plan ab set, SKTC, quad stretch, clamshell, LE extension, SLR   Consulted and Agree with Plan of Care Patient      Patient will benefit from skilled therapeutic intervention in order to improve the following deficits and  impairments:  Abnormal gait, Decreased activity tolerance, Decreased balance, Decreased endurance, Decreased mobility,  Decreased range of motion, Decreased strength, Difficulty walking, Impaired flexibility, Postural dysfunction, Pain, Improper body mechanics, Hypomobility, Increased muscle spasms  Visit Diagnosis: Pain in left hip  Chronic low back pain, unspecified back pain laterality, with sciatica presence unspecified  Other muscle spasm  Unsteadiness on feet  Other abnormalities of gait and mobility  Muscle weakness (generalized)     Problem List Patient Active Problem List   Diagnosis Date Noted  . Osteopenia 03/30/2015  . Clubbing of fingers 04/14/2014  . Hyperglycemia 10/20/2013  . Smoker   . Closed dislocation of acromioclavicular joint 04/11/2010  . BREAST MASS, LEFT 01/20/2008  . ONYCHOMYCOSIS, TOENAILS 10/20/2007  . MUSCLE CRAMPS 10/20/2007  . Hypercholesterolemia 05/28/2007  . CATARACT NOS 08/28/2006  . DISTURBANCE, VISUAL NOS 08/19/2006  . COPD (chronic obstructive pulmonary disease) with emphysema ([place]) 08/19/2006  . WITHDRAWAL, DRUG 06/24/2006  . ANXIETY 06/24/2006  . DEPRESSION 06/24/2006  . MYOCARDIAL INFARCTION, HX OF 06/24/2006  . Coronary atherosclerosis 06/24/2006  . CARDIAC ARRHYTHMIA 06/24/2006  . CONSTIPATION 06/24/2006  . OSTEOARTHRITIS 06/24/2006  . LOW BACK PAIN 06/24/2006  . INSOMNIA 06/24/2006    Teena Irani, PTA/CLT [phone]  05/20/2016, 3:26 PM  [facility] [address] [place], [place], [address] Phone: [phone]   Fax:  [phone]  Name: [name] MRN: [id number] Date of Birth: 15-Jul-1950

## 2016-05-22 ENCOUNTER — Encounter (HOSPITAL_COMMUNITY): Payer: Medicare PPO | Admitting: Physical Therapy

## 2016-05-27 ENCOUNTER — Ambulatory Visit (HOSPITAL_COMMUNITY): Payer: Medicare PPO | Admitting: Physical Therapy

## 2016-05-27 ENCOUNTER — Encounter (HOSPITAL_COMMUNITY): Payer: Medicare PPO | Admitting: Physical Therapy

## 2016-05-27 DIAGNOSIS — M25552 Pain in left hip: Secondary | ICD-10-CM | POA: Diagnosis not present

## 2016-05-27 DIAGNOSIS — M6281 Muscle weakness (generalized): Secondary | ICD-10-CM

## 2016-05-27 DIAGNOSIS — M545 Low back pain: Secondary | ICD-10-CM | POA: Diagnosis not present

## 2016-05-27 DIAGNOSIS — M62838 Other muscle spasm: Secondary | ICD-10-CM | POA: Diagnosis not present

## 2016-05-27 DIAGNOSIS — R2681 Unsteadiness on feet: Secondary | ICD-10-CM

## 2016-05-27 DIAGNOSIS — G8929 Other chronic pain: Secondary | ICD-10-CM | POA: Diagnosis not present

## 2016-05-27 DIAGNOSIS — R2689 Other abnormalities of gait and mobility: Secondary | ICD-10-CM | POA: Diagnosis not present

## 2016-05-27 NOTE — Therapy (Signed)
[facility] [place] [place], [place], [address] Phone: [phone]   Fax:  [phone]  Physical Therapy Treatment  Patient Details  Name: [name] MRN: [id number] Date of Birth: July 11, 1951 Referring Provider: Jenna Luo, MD  Encounter Date: 05/27/2016      PT End of Session - 05/27/16 1811    Visit Number 5   Number of Visits 6   Date for PT Re-Evaluation 05/15/16   Authorization Type [employer]    Authorization Time Period 04/24/16 to 06/05/16   Authorization - Visit Number 4   Authorization - Number of Visits 6   PT Start Time 1520   PT Stop Time 1600   PT Time Calculation (min) 40 min   Equipment Utilized During Treatment Gait belt   Activity Tolerance Patient tolerated treatment well;No increased pain   Behavior During Therapy WFL for tasks assessed/performed      Past Medical History:  Diagnosis Date  . Anxiety   . CAD (coronary artery disease)   . COPD (chronic obstructive pulmonary disease) ([place])   . Depression   . Myocardial infarction   . Osteopenia   . Panic attacks   . Smoker     Past Surgical History:  Procedure Laterality Date  . BACK SURGERY  12/24/2000   L5,S1  . CORONARY STENT PLACEMENT  2005   RCA & CX  . HERNIA REPAIR Right 1980's  . INGUINAL HERNIA REPAIR  12/1978   right side  . NM MYOCAR PERF WALL MOTION  09/07/2009   No ischemia; EF 51%  . SHOULDER SURGERY Left 08/2010  . SPINE SURGERY  2002   L5-S1    There were no vitals filed for this visit.      Subjective Assessment - 05/27/16 1524    Subjective Pt states that he has been trying to walk without his knee brace lately. He has fallen a couple of times but denies any injuries from this. He has also been doing his exercises at home.    Currently in Pain? Other (Comment)  No pain in the hip, but has some soreness behind his knee.                          [facility] Adult PT Treatment/Exercise - 05/27/16  0001      Lumbar Exercises: Standing   Other Standing Lumbar Exercises Rockerboard lateral, ant/post x2 min each, 1 UE support     Lumbar Exercises: Seated   Long Arc Quad on Chair Right;2 sets;20 reps   LAQ on Chair Weights (lbs) 5   LAQ on Chair Limitations x20 reps with 5#, x10 reps with 10#   Sit to Stand 10 reps   Sit to Stand Limitations x2 sets from elevated surface      Lumbar Exercises: Prone   Straight Leg Raise 15 reps   Straight Leg Raises Limitations each LE   Other Prone Lumbar Exercises hamstring curls x15 reps with red TB                PT Education - 05/27/16 1814    Education provided Yes   Education Details continued to encourage pt to ambulate without his brace to encourage furhter improvements in strength and proprioception; updated HEP   Person(s) Educated Patient   Methods Explanation;Other (comment)  emailed pt    Comprehension Verbalized understanding          PT Short Term Goals -  04/24/16 1821      PT SHORT TERM GOAL #1   Title Pt will demo consistency and independence with his HEP to improve strength and mobility.   Time 2   Period Weeks   Status New     PT SHORT TERM GOAL #2   Title Pt will report no greater than 5/10 pain with activity to improve his tolerance to stair negotiation.   Time 2   Period Weeks   Status New     PT SHORT TERM GOAL #3   Title Pt will demo correct set up and use of lumbar roll, without cues from therapist, to aid in correcting his sitting posture to decrease pain.    Time 2   Period Weeks   Status New           PT Long Term Goals - 04/24/16 1823      PT LONG TERM GOAL #1   Title Pt will demo improved LLE strength to 5/5 MMT and RLE to 4/5 MMT to assist with functional activity such as ambulation and stair negotiation.    Time 6   Period Weeks   Status New     PT LONG TERM GOAL #2   Title Pt will demo improved postural awareness evident by his ability to self correct posture atleast 50% of  the session without cues from therapist.    Time 6   Period Weeks   Status New     PT LONG TERM GOAL #3   Title Pt will report no more than 3/10 pain during ambulation after 10 minutes, to allow him to improve his overall fitness.    Time 6   Period Weeks   Status New     PT LONG TERM GOAL #4   Title pt will report no more than minimal tenderness with palpation throughout his Lt hip musculature (piriformis, TFL), to improve his sitting tolerance.    Time 6   Period Weeks   Status New               Plan - 05/27/16 1600    Clinical Impression Statement Pt arrived today without his knee brace. He is making progress towards his goals with improved strength and activity tolerance. He demonstrates improved understanding of the importance of LE strengthening exercise and has been diligently performing his HEP at home. Increased reps and resistance with good tolerance without increase in pain. Will continue with current POC.   Rehab Potential Fair   Clinical Impairments Affecting Rehab Potential pre-existing RLE weakness impairing gait mechanics    PT Frequency 2x / week   PT Duration 6 weeks   PT Treatment/Interventions ADLs/Self Care Home Management;Moist Heat;Therapeutic activities;Stair training;Functional mobility training;Gait training;Balance training;Neuromuscular re-education;Patient/family education;Orthotic Fit/Training;Manual techniques;Passive range of motion;Dry needling   PT Next Visit Plan trunk strength/stability progression, LE strength and flexibility and manual techniques to address muscle spasm/soft tissue restrictions    PT Home Exercise Plan bridge, sit to stand, hamstring curls, LAQ (forgot to give pt band...he will need this at next visit...green)   Consulted and Agree with Plan of Care Patient      Patient will benefit from skilled therapeutic intervention in order to improve the following deficits and impairments:  Abnormal gait, Decreased activity tolerance,  Decreased balance, Decreased endurance, Decreased mobility, Decreased range of motion, Decreased strength, Difficulty walking, Impaired flexibility, Postural dysfunction, Pain, Improper body mechanics, Hypomobility, Increased muscle spasms  Visit Diagnosis: Pain in left hip  Chronic low back pain,  unspecified back pain laterality, with sciatica presence unspecified  Other muscle spasm  Unsteadiness on feet  Other abnormalities of gait and mobility  Muscle weakness (generalized)       G-Codes - 06/01/16 1811    Functional Assessment Tool Used clinical judgement based on assessment of ROM, strength and mobility    Functional Limitation Mobility: Walking and moving around   Mobility: Walking and Moving Around Current Status [phone]) At least 40 percent but less than 60 percent impaired, limited or restricted   Mobility: Walking and Moving Around Goal Status [phone]) At least 40 percent but less than 60 percent impaired, limited or restricted      Problem List Patient Active Problem List   Diagnosis Date Noted  . Osteopenia 03/30/2015  . Clubbing of fingers 04/14/2014  . Hyperglycemia 10/20/2013  . Smoker   . Closed dislocation of acromioclavicular joint 04/11/2010  . BREAST MASS, LEFT 01/20/2008  . ONYCHOMYCOSIS, TOENAILS 10/20/2007  . MUSCLE CRAMPS 10/20/2007  . Hypercholesterolemia 02-Jun-2007  . CATARACT NOS 08/28/2006  . DISTURBANCE, VISUAL NOS 08/19/2006  . COPD (chronic obstructive pulmonary disease) with emphysema ([place]) 08/19/2006  . WITHDRAWAL, DRUG 06/24/2006  . ANXIETY 06/24/2006  . DEPRESSION 06/24/2006  . MYOCARDIAL INFARCTION, HX OF 06/24/2006  . Coronary atherosclerosis 06/24/2006  . CARDIAC ARRHYTHMIA 06/24/2006  . CONSTIPATION 06/24/2006  . OSTEOARTHRITIS 06/24/2006  . LOW BACK PAIN 06/24/2006  . INSOMNIA 06/24/2006    6:18 PM,2016-06-01 Elly Modena PT, DPT [facility] [address] [place], [place], [address] Phone: [phone]   Fax:  [phone]  Name: [name] MRN: [id number] Date of Birth: 04-03-1951

## 2016-05-29 ENCOUNTER — Encounter (HOSPITAL_COMMUNITY): Payer: Medicare PPO | Admitting: Physical Therapy

## 2016-06-03 ENCOUNTER — Ambulatory Visit (HOSPITAL_COMMUNITY): Payer: Medicare PPO | Admitting: Physical Therapy

## 2016-06-03 DIAGNOSIS — G8929 Other chronic pain: Secondary | ICD-10-CM | POA: Diagnosis not present

## 2016-06-03 DIAGNOSIS — R2689 Other abnormalities of gait and mobility: Secondary | ICD-10-CM | POA: Diagnosis not present

## 2016-06-03 DIAGNOSIS — R2681 Unsteadiness on feet: Secondary | ICD-10-CM

## 2016-06-03 DIAGNOSIS — M6281 Muscle weakness (generalized): Secondary | ICD-10-CM

## 2016-06-03 DIAGNOSIS — M25552 Pain in left hip: Secondary | ICD-10-CM | POA: Diagnosis not present

## 2016-06-03 DIAGNOSIS — M62838 Other muscle spasm: Secondary | ICD-10-CM

## 2016-06-03 DIAGNOSIS — M545 Low back pain: Secondary | ICD-10-CM

## 2016-06-03 NOTE — Therapy (Signed)
[place], [place], [address] Phone: [phone]   Fax:  [phone]  Physical Therapy Treatment  Patient Details  Name: QUINNTIN MALTER MRN: [id number] Date of Birth: [birth year] Referring Provider: Jenna Luo   Encounter Date: 06/03/2016      PT End of Session - 06/03/16 1329    Visit Number 6   Number of Visits 6   Date for PT Re-Evaluation 05/15/16   Authorization Type [employer] medicare    Authorization Time Period 04/24/16 to 06/05/16   Authorization - Visit Number 6   Authorization - Number of Visits 6   PT Start Time 1300   PT Stop Time 1342   PT Time Calculation (min) 45 min   Equipment Utilized During Treatment Gait belt   Activity Tolerance Patient tolerated treatment well;No increased pain   Behavior During Therapy WFL for tasks assessed/performed      Past Medical History:  Diagnosis Date  . Anxiety   . CAD (coronary artery disease)   . COPD (chronic obstructive pulmonary disease) ([place])   . Depression   . Myocardial infarction   . Osteopenia   . Panic attacks   . Smoker     Past Surgical History:  Procedure Laterality Date  . BACK SURGERY  12/24/2000   L5,S1  . CORONARY STENT PLACEMENT  2005   RCA & CX  . HERNIA REPAIR Right 1980's  . INGUINAL HERNIA REPAIR  12/1978   right side  . NM MYOCAR PERF WALL MOTION  09/07/2009   No ischemia; EF 51%  . SHOULDER SURGERY Left 08/2010  . SPINE SURGERY  2002   L5-S1    There were no vitals filed for this visit.      Subjective Assessment - 06/03/16 1302    Subjective Pt states that he has 5 collapse discs in his back and he is not able to stand up erect.     Pertinent History anxiety, CAD, COPD, depression, osteopenia, back surgery L5/S1   Limitations Walking;Other (comment)  up steps    How long can you sit comfortably? 25-30 minutes only limited from his back, hip usually alleviates    How long can you stand comfortably? 30-40  minutes was 20 minutes    How long can you walk comfortably? not walking for exercises at this time.  1/2 block   Diagnostic tests xray: negative for fracture   Patient Stated Goals able to walk up/down steps   Currently in Pain? Yes   Pain Score 2   will go as high as a 9   Pain Location Hip   Pain Orientation Left   Pain Descriptors / Indicators Aching   Pain Type Chronic pain   Pain Radiating Towards thigh   Pain Onset More than a month ago   Pain Frequency Constant  but varies in intensity             [facility] PT Assessment - 06/03/16 0001      Assessment   Medical Diagnosis Lt hip pain   Referring Provider Cletus Gash Pickard    Onset Date/Surgical Date --  ~6 months ago    Next MD Visit none scheduled   Prior Therapy none for this issue     Precautions   Precautions None     Restrictions   Weight Bearing Restrictions No     Balance Screen   Has the patient fallen in the past 6 months Yes   How many times?  3   Has the patient had a decrease in activity level because of a fear of falling?  Yes   Is the patient reluctant to leave their home because of a fear of falling?  No     Home Social worker Private residence   Home Access Stairs to enter  1 STE     Prior Function   Level of Independence Independent     Cognition   Overall Cognitive Status Within Functional Limits for tasks assessed   Behaviors Other (comment)  pt requiring redirection to tasks      Observation/Other Assessments   Observations pt wearing homemade KO     Sensation   Light Touch Appears Intact     Posture/Postural Control   Posture/Postural Control Postural limitations   Postural Limitations Forward head;Rounded Shoulders;Decreased lumbar lordosis;Increased thoracic kyphosis     AROM   Lumbar Flexion WNL, pain reported Lt hip    Lumbar Extension limited, unable to reach neutral, increased pain back and Lt hip      Strength   Right Hip Flexion 5/5  was 4+/5    Right Hip Extension 4/5  was 4/5    Right Hip ABduction 4/5   Left Hip Flexion 5/5  was 4+/ 5   Left Hip Extension 5/5  was 4/5    Left Hip ABduction 5/5   Right Knee Flexion 5/5  was 4-/5   Right Knee Extension 5/5   Left Knee Flexion 5/5   Left Knee Extension 5/5   Right Ankle Dorsiflexion --  5-/5was 4+/5    Left Ankle Dorsiflexion 5/5     Palpation   SI assessment  (-) SI testing    Palpation comment TTP, trigger points along Lt TFL, piriformis     Special Tests    Special Tests Hip Special Tests;Lumbar   Lumbar Tests FABER test;Slump Test;Straight Leg Raise;Prone Knee Bend Test   Hip Special Tests  Marcello Moores Test;SI Compression;SI Distraction;Anterior Hip Impingement Test;Piriformis Test;Patrick (FABER) Test     FABER test   findings Negative     Slump test   Findings Negative     Prone Knee Bend Test   Findings Negative     Straight Leg Raise   Findings Negative     Saralyn Pilar (FABER) Test   Findings Negative     Thomas Test    Findings Positive   Side Right;Left   Comments Rt side greater than Left      SI Compression   Findings Negative     SI Distraction   Findings  Negative     Anterior Hip Impingement Test    Findings Negative     Piriformis Test   Findings Positive   Side  Left     Transfers   Comments Pt is no longer using his brace.      Ambulation/Gait   Ambulation/Gait --   Ambulation/Gait Assistance --   Ambulation Distance (Feet) --   Assistive device --   Gait Pattern --                     [facility] Adult PT Treatment/Exercise - 06/03/16 0001      Lumbar Exercises: Stretches   Active Hamstring Stretch 3 reps;30 seconds   Single Knee to Chest Stretch 5 reps   Standing Extension 5 reps   Prone on Elbows Stretch 5 reps   Piriformis Stretch 3 reps;30 seconds   Piriformis Stretch Limitations BLE  PT Education - 06/03/16 1334    Education provided Yes   Education Details HEP   Person(s)  Educated Patient   Methods Explanation;Handout   Comprehension Verbalized understanding;Returned demonstration          PT Short Term Goals - 06/03/16 1330      PT SHORT TERM GOAL #1   Title Pt will demo consistency and independence with his HEP to improve strength and mobility.   Time 2   Period Weeks   Status On-going     PT SHORT TERM GOAL #2   Title Pt will report no greater than 5/10 pain with activity to improve his tolerance to stair negotiation.   Time 2   Period Weeks   Status On-going     PT SHORT TERM GOAL #3   Title Pt will demo correct set up and use of lumbar roll, without cues from therapist, to aid in correcting his sitting posture to decrease pain.    Time 2   Period Weeks   Status On-going           PT Long Term Goals - 06/03/16 1330      PT LONG TERM GOAL #1   Title Pt will demo improved LLE strength to 5/5 MMT and RLE to 4/5 MMT to assist with functional activity such as ambulation and stair negotiation.    Time 6   Period Weeks   Status Achieved     PT LONG TERM GOAL #2   Title Pt will demo improved postural awareness evident by his ability to self correct posture atleast 50% of the session without cues from therapist.    Time 6   Period Weeks   Status On-going     PT LONG TERM GOAL #3   Title Pt will report no more than 3/10 pain during ambulation after 10 minutes, to allow him to improve his overall fitness.    Time 6   Period Weeks   Status On-going     PT LONG TERM GOAL #4   Title pt will report no more than minimal tenderness with palpation throughout his Lt hip musculature (piriformis, TFL), to improve his sitting tolerance.    Time 6   Period Weeks   Status On-going               Plan - 06/03/16 1335    Clinical Impression Statement PT continue to keep his knee brace off.  Pt reassessed with good improvement in strength.  Pt main limitation appears to be flexibility.  Pt and therapist discussed and pt would prefer to  work on stretching exercies on his own at home.     Rehab Potential Fair   Clinical Impairments Affecting Rehab Potential pre-existing RLE weakness impairing gait mechanics    PT Frequency 2x / week   PT Duration 6 weeks   PT Treatment/Interventions ADLs/Self Care Home Management;Moist Heat;Therapeutic activities;Stair training;Functional mobility training;Gait training;Balance training;Neuromuscular re-education;Patient/family education;Orthotic Fit/Training;Manual techniques;Passive range of motion;Dry needling   PT Next Visit Plan trunk strength/stability progression, LE strength and flexibility and manual techniques to address muscle spasm/soft tissue restrictions    PT Home Exercise Plan bridge, sit to stand, hamstring curls, LAQ (forgot to give pt band...he will need this at next visit...green); hamstring, piriformis stretches, knee to chest , standing extension and POE   Consulted and Agree with Plan of Care Patient      Patient will benefit from skilled therapeutic intervention in order to improve the following deficits and  impairments:  Abnormal gait, Decreased activity tolerance, Decreased balance, Decreased endurance, Decreased mobility, Decreased range of motion, Decreased strength, Difficulty walking, Impaired flexibility, Postural dysfunction, Pain, Improper body mechanics, Hypomobility, Increased muscle spasms  Visit Diagnosis: Pain in left hip  Chronic low back pain, unspecified back pain laterality, with sciatica presence unspecified  Other muscle spasm  Unsteadiness on feet  Other abnormalities of gait and mobility  Muscle weakness (generalized)       G-Codes - 06-11-2016 1337    Functional Assessment Tool Used ROM, strength and mobility   Functional Limitation Mobility: Walking and moving around   Mobility: Walking and Moving Around Goal Status [phone]) At least 40 percent but less than 60 percent impaired, limited or restricted   Mobility: Walking and Moving Around  Discharge Status [phone]) At least 40 percent but less than 60 percent impaired, limited or restricted      Problem List Patient Active Problem List   Diagnosis Date Noted  . Osteopenia 03/30/2015  . Clubbing of fingers 04/14/2014  . Hyperglycemia 10/20/2013  . Smoker   . Closed dislocation of acromioclavicular joint 04/11/2010  . BREAST MASS, LEFT 01/20/2008  . ONYCHOMYCOSIS, TOENAILS 10/20/2007  . MUSCLE CRAMPS 10/20/2007  . Hypercholesterolemia 05/28/2007  . CATARACT NOS 08/28/2006  . DISTURBANCE, VISUAL NOS 08/19/2006  . COPD (chronic obstructive pulmonary disease) with emphysema ([place]) 08/19/2006  . WITHDRAWAL, DRUG 06/24/2006  . ANXIETY 06/24/2006  . DEPRESSION 06/24/2006  . MYOCARDIAL INFARCTION, HX OF 06/24/2006  . Coronary atherosclerosis 06/24/2006  . CARDIAC ARRHYTHMIA 06/24/2006  . CONSTIPATION 06/24/2006  . OSTEOARTHRITIS 06/24/2006  . LOW BACK PAIN 06/24/2006  . INSOMNIA 06/24/2006    Rayetta Humphrey, PT CLT [id number] 06/11/2016, 1:42 PM  [place] [address] [place], [place], [address] Phone: [phone]   Fax:  [phone]  Name: OSHEN WLODARCZYK MRN: [id number] Date of Birth: 1951/06/08  PHYSICAL THERAPY DISCHARGE SUMMARY  Visits from Start of Care: 6  Current functional level related to goals / functional outcomes: See above   Remaining deficits: See above   Education / Equipment: The importance of Posture and HEP for stretching  Plan: Patient agrees to discharge.  Patient goals were partially met. Patient is being discharged due to the patient's request.  ?????        Rayetta Humphrey, [place] CLT [phone]

## 2016-06-03 NOTE — Patient Instructions (Addendum)
Backward Bend (Standing)    Arch backward to make hollow of back deeper. Hold ___2_ seconds. Repeat _5___ times per set. Do _1___ sets per session. Do _2___ sessions per day.  http://orth.exer.us/178   Copyright  VHI. All rights reserved.  On Elbows (Prone)    Rise up on elbows as high as possible, keeping hips on floor. Hold ___60_ seconds. Repeat 2____ times per set. Do __1__ sets per session. Do __2__ sessions per day.  http://orth.exer.us/92   Copyright  VHI. All rights reserved.  Stretching: Hamstring (Supine)    Supporting right thigh behind knee, slowly straighten knee until stretch is felt in back of thigh. Hold __30__ seconds. Repeat __3__ times per set. Do __0__ sets per session. Do ____ sessions per day. 2 http://orth.exer.us/656   Copyright  VHI. All rights reserved.  Piriformis Stretch, Sitting    Sit, one ankle on opposite knee, same-side hand on crossed knee. Push down on knee, keeping spine straight. Lean torso forward, with flat back, until tension is felt in hamstrings and gluteals of crossed-leg side. Hold __30_ seconds.  Repeat _3__ times per session. Do _2__ sessions per day.  Copyright  VHI. All rights reserved.  Knee-to-Chest Stretch: Unilateral    With hand behind right knee, pull knee in to chest until a comfortable stretch is felt in lower back and buttocks. Keep back relaxed. Hold __30__ seconds. Repeat __3__ times per set. Do _1___ sets per session. Do _2___ sessions per day.  http://orth.exer.us/126   Copyright  VHI. All rights reserved.  Lumbar Rotation (Non-Weight Bearing)    Feet on floor, slowly rock knees from side to side in small, pain-free range of motion. Allow lower back to rotate slightly. Repeat __10__ times per set. Do __1__ sets per session. Do _2___ sessions per day.  http://orth.exer.us/160   Copyright  VHI. All rights reserved.

## 2016-06-19 ENCOUNTER — Other Ambulatory Visit: Payer: Self-pay | Admitting: Cardiovascular Disease

## 2016-06-19 DIAGNOSIS — F3342 Major depressive disorder, recurrent, in full remission: Secondary | ICD-10-CM | POA: Diagnosis not present

## 2016-09-04 DIAGNOSIS — F3342 Major depressive disorder, recurrent, in full remission: Secondary | ICD-10-CM | POA: Diagnosis not present

## 2016-09-08 ENCOUNTER — Ambulatory Visit (INDEPENDENT_AMBULATORY_CARE_PROVIDER_SITE_OTHER): Payer: Medicare PPO | Admitting: Family Medicine

## 2016-09-08 ENCOUNTER — Encounter: Payer: Self-pay | Admitting: Family Medicine

## 2016-09-08 VITALS — BP 132/80 | HR 80 | Temp 97.8°F | Resp 18 | Ht 69.0 in | Wt 163.0 lb

## 2016-09-08 DIAGNOSIS — K5909 Other constipation: Secondary | ICD-10-CM | POA: Diagnosis not present
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
## 2016-09-15 ENCOUNTER — Other Ambulatory Visit: Payer: Self-pay | Admitting: Family Medicine

## 2016-09-15 NOTE — Telephone Encounter (Signed)
Ok to refill 

## 2016-09-15 NOTE — Telephone Encounter (Signed)
ok 

## 2016-09-24 ENCOUNTER — Telehealth: Payer: Self-pay | Admitting: Family Medicine

## 2016-09-24 NOTE — Telephone Encounter (Signed)
Pt states that you gave him some samples of Linzess : 1) it is too expensive for him. 2) did not help much at all. He would like something else and something that is generic.

## 2016-09-25 MED ORDER — POLYETHYLENE GLYCOL 3350 17 GM/SCOOP PO POWD: 17.0000 g | Freq: Every day | ORAL | 1 refills | Status: DC

## 2016-09-25 NOTE — Telephone Encounter (Signed)
Suggest daily miralax.

## 2016-09-25 NOTE — Telephone Encounter (Signed)
Call placed to patient and patient made aware.  

## 2016-10-25 ENCOUNTER — Other Ambulatory Visit (HOSPITAL_COMMUNITY): Payer: Self-pay | Admitting: Psychiatry

## 2016-11-13 DIAGNOSIS — F3342 Major depressive disorder, recurrent, in full remission: Secondary | ICD-10-CM | POA: Diagnosis not present

## 2017-01-13 ENCOUNTER — Encounter: Payer: Self-pay | Admitting: Family Medicine

## 2017-01-13 ENCOUNTER — Ambulatory Visit (INDEPENDENT_AMBULATORY_CARE_PROVIDER_SITE_OTHER): Payer: Medicare PPO | Admitting: Family Medicine

## 2017-01-13 VITALS — BP 112/66 | HR 80 | Temp 98.3°F | Resp 14 | Ht 69.0 in | Wt 165.0 lb

## 2017-01-13 DIAGNOSIS — R590 Localized enlarged lymph nodes: Secondary | ICD-10-CM | POA: Diagnosis not present

## 2017-01-13 DIAGNOSIS — R221 Localized swelling, mass and lump, neck: Secondary | ICD-10-CM | POA: Diagnosis not present

## 2017-01-13 NOTE — Progress Notes (Signed)
Subjective:    Patient ID: [name], male    DOB: [birth year], 66 y.o.   MRN: [id number]  HPI  Patient reports a three-week history of a painless mass above his right clavicle adjacent to his neck. It is approximately 6 cm in diameter. It is firm and hard and mobile. He denies any fevers or night sweats or hemoptysis or cough. He does have a history of smoking.  His weight has remained stable. Wt Readings from Last 3 Encounters:  01/13/17 165 lb (74.8 kg)  09/08/16 163 lb (73.9 kg)  05/05/16 161 lb 8 oz (73.3 kg)   Past Medical History:  Diagnosis Date  . Anxiety   . CAD (coronary artery disease)   . COPD (chronic obstructive pulmonary disease) ([place])   . Depression   . Myocardial infarction ([place])   . Osteopenia   . Panic attacks   . Smoker    Past Surgical History:  Procedure Laterality Date  . BACK SURGERY  12/24/2000   L5,S1  . CORONARY STENT PLACEMENT  2005   RCA & CX  . HERNIA REPAIR Right 1980's  . INGUINAL HERNIA REPAIR  12/1978   right side  . NM MYOCAR PERF WALL MOTION  09/07/2009   No ischemia; EF 51%  . SHOULDER SURGERY Left 08/2010  . SPINE SURGERY  2002   L5-S1   Current Outpatient Prescriptions on File Prior to Visit  Medication Sig Dispense Refill  . alendronate (FOSAMAX) 70 MG tablet Take 1 tablet (70 mg total) by mouth every 7 (seven) days. Take with a full glass of water on an empty stomach. 4 tablet 11  . ALPRAZolam (XANAX) 1 MG tablet Take 1 mg by mouth 4 (four) times daily.    Marland Kitchen aspirin EC 81 MG tablet Take 81 mg by mouth at bedtime.    Marland Kitchen atorvastatin (LIPITOR) 10 MG tablet TAKE 1 TABLET AT BEDTIME  (DISCONTINUE ATORVASTATIN 20MG ) 90 tablet 3  . clotrimazole-betamethasone (LOTRISONE) cream Apply 1 application topically 2 (two) times daily. 30 g 0  . co-enzyme Q-10 30 MG capsule Take 1 capsule (30 mg total) by mouth daily. 90 capsule 3  . cyclobenzaprine (FLEXERIL) 10 MG tablet TAKE 1 TABLET THREE TIMES DAILY AS NEEDED FOR MUSCLE SPASMS. 270 tablet  0  . magnesium oxide (MAG-OX) 400 MG tablet Take 400 mg by mouth daily.    . polyethylene glycol powder (GLYCOLAX/MIRALAX) powder Take 17 g by mouth daily. 3350 g 1  . traZODone (DESYREL) 100 MG tablet Take 400 mg by mouth at bedtime.    Marland Kitchen venlafaxine (EFFEXOR) 75 MG tablet Take 150 mg by mouth 2 (two) times daily.      No current facility-administered medications on file prior to visit.    Allergies  Allergen Reactions  . Codeine Nausea Only  . Niaspan Durene Cal Er]    Social History   Social History  . Marital status: Single    Spouse name: N/A  . Number of children: N/A  . Years of education: N/A   Occupational History  . Not on file.   Social History Main Topics  . Smoking status: Current Every Day Smoker    Packs/day: 1.00    Types: Cigarettes  . Smokeless tobacco: Never Used  . Alcohol use No  . Drug use: No  . Sexual activity: Not on file   Other Topics Concern  . Not on file   Social History Narrative  . No narrative on file  Review of Systems  All other systems reviewed and are negative.      Objective:   Physical Exam  Cardiovascular: Normal rate, regular rhythm and normal heart sounds.   Pulmonary/Chest: Effort normal and breath sounds normal. No respiratory distress. He has no wheezes. He has no rales.  Lymphadenopathy:       Head (right side): No submental, no submandibular, no tonsillar, no preauricular, no posterior auricular and no occipital adenopathy present.       Head (left side): No submental, no submandibular, no tonsillar, no preauricular, no posterior auricular and no occipital adenopathy present.    He has no cervical adenopathy.       Right: Supraclavicular adenopathy present.  Vitals reviewed.         Assessment & Plan:  Mass of right side of neck - Plan: CBC with Differential/Platelet, COMPLETE METABOLIC PANEL WITH GFR  I am concerned that this is a neoplasm involving the lymph node in the right supraclavicular space.  Differential includes lymphoma versus metastatic spread from another primary. Patient needs tissue diagnosis. Begin by staging with a CT scan of the neck and the chest to evaluate for other lymphadenopathy, to characterize the mass further prior to surgical intervention, and to evaluate for a different primary such as lung cancer.

## 2017-01-14 LAB — COMPLETE METABOLIC PANEL WITH GFR
ALT: 8 U/L — ABNORMAL LOW (ref 9–46)
AST: 13 U/L (ref 10–35)
Albumin: 3.7 g/dL (ref 3.6–5.1)
Alkaline Phosphatase: 66 U/L (ref 40–115)
BUN: 11 mg/dL (ref 7–25)
CO2: 25 mmol/L (ref 20–31)
Calcium: 8.7 mg/dL (ref 8.6–10.3)
Chloride: 105 mmol/L (ref 98–110)
Creat: 0.96 mg/dL (ref 0.70–1.25)
GFR, Est African American: >89 mL/min (ref >=60)
GFR, Est Non African American: 83 mL/min (ref >=60)
Glucose, Bld: 87 mg/dL (ref 70–99)
Potassium: 4.4 mmol/L (ref 3.5–5.3)
Sodium: 136 mmol/L (ref 135–146)
Total Bilirubin: 0.5 mg/dL (ref 0.2–1.2)
Total Protein: 6.6 g/dL (ref 6.1–8.1)

## 2017-01-14 LAB — CBC WITH DIFFERENTIAL/PLATELET
Basophils Absolute: 0 cells/uL (ref 0–200)
Basophils Relative: 0 %
Eosinophils Absolute: 126 cells/uL (ref 15–500)
Eosinophils Relative: 2 %
HCT: 41.2 % (ref 38.5–50.0)
Hemoglobin: 13.7 g/dL (ref 13.0–17.0)
Lymphocytes Relative: 26 %
Lymphs Abs: 1638 cells/uL (ref 850–3900)
MCH: 30.2 pg (ref 27.0–33.0)
MCHC: 33.3 g/dL (ref 32.0–36.0)
MCV: 90.9 fL (ref 80.0–100.0)
MPV: 10.2 fL (ref 7.5–12.5)
Monocytes Absolute: 315 cells/uL (ref 200–950)
Monocytes Relative: 5 %
Neutro Abs: 4221 cells/uL (ref 1500–7800)
Neutrophils Relative %: 67 %
Platelets: 229 10*3/uL (ref 140–400)
RBC: 4.53 MIL/uL (ref 4.20–5.80)
RDW: 15.5 % — ABNORMAL HIGH (ref 11.0–15.0)
WBC: 6.3 10*3/uL (ref 3.8–10.8)

## 2017-01-16 ENCOUNTER — Ambulatory Visit
Admission: RE | Admit: 2017-01-16 | Discharge: 2017-01-16 | Disposition: A | Discharge status: Home / Self Care | Payer: Medicare PPO | Source: Ambulatory Visit | Attending: Family Medicine | Admitting: Family Medicine

## 2017-01-16 DIAGNOSIS — R221 Localized swelling, mass and lump, neck: Secondary | ICD-10-CM | POA: Diagnosis not present

## 2017-01-16 DIAGNOSIS — J439 Emphysema, unspecified: Secondary | ICD-10-CM | POA: Diagnosis not present

## 2017-01-16 DIAGNOSIS — R590 Localized enlarged lymph nodes: Secondary | ICD-10-CM

## 2017-01-16 MED ORDER — IOPAMIDOL (ISOVUE-300) INJECTION 61%
75.0000 mL | Freq: Once | INTRAVENOUS | Status: AC | PRN
  Administered 2017-01-16: 75 mL via INTRAVENOUS

## 2017-01-19 ENCOUNTER — Other Ambulatory Visit: Payer: Self-pay | Admitting: Family Medicine

## 2017-01-19 ENCOUNTER — Other Ambulatory Visit (HOSPITAL_COMMUNITY): Payer: Self-pay | Admitting: Oncology

## 2017-01-19 ENCOUNTER — Inpatient Hospital Stay: Admission: RE | Admit: 2017-01-19 | Payer: Medicare PPO | Source: Ambulatory Visit

## 2017-01-19 DIAGNOSIS — C8591 Non-Hodgkin lymphoma, unspecified, lymph nodes of head, face, and neck: Secondary | ICD-10-CM

## 2017-01-19 DIAGNOSIS — R591 Generalized enlarged lymph nodes: Secondary | ICD-10-CM

## 2017-01-22 ENCOUNTER — Encounter (HOSPITAL_COMMUNITY): Payer: Medicare PPO | Attending: Oncology | Admitting: Oncology

## 2017-01-22 ENCOUNTER — Telehealth: Payer: Self-pay | Admitting: Family Medicine

## 2017-01-22 ENCOUNTER — Encounter (HOSPITAL_COMMUNITY): Payer: Self-pay

## 2017-01-22 DIAGNOSIS — C3490 Malignant neoplasm of unspecified part of unspecified bronchus or lung: Secondary | ICD-10-CM | POA: Insufficient documentation

## 2017-01-22 DIAGNOSIS — R591 Generalized enlarged lymph nodes: Secondary | ICD-10-CM | POA: Diagnosis not present

## 2017-01-22 DIAGNOSIS — R221 Localized swelling, mass and lump, neck: Secondary | ICD-10-CM | POA: Diagnosis not present

## 2017-01-22 DIAGNOSIS — K769 Liver disease, unspecified: Secondary | ICD-10-CM | POA: Diagnosis not present

## 2017-01-22 NOTE — Patient Instructions (Addendum)
[place] at [facility] Discharge Instructions  RECOMMENDATIONS MADE BY THE CONSULTANT AND ANY TEST RESULTS WILL BE SENT TO YOUR REFERRING PHYSICIAN.  You were seen today by Dr. Twana First We will get you scheduled for Ultrasound guided biopsy of lymphnode on the right We will also get you scheduled for port a cath placement Follow up in 2 weeks   Thank you for choosing [place] at [facility] to provide your oncology and hematology care.  To afford each patient quality time with our provider, please arrive at least 15 minutes before your scheduled appointment time.    If you have a lab appointment with the [facility] please come in thru the  Main Entrance and check in at the main information desk  You need to re-schedule your appointment should you arrive 10 or more minutes late.  We strive to give you quality time with our providers, and arriving late affects you and other patients whose appointments are after yours.  Also, if you no show three or more times for appointments you may be dismissed from the clinic at the providers discretion.     Again, thank you for choosing [facility].  Our hope is that these requests will decrease the amount of time that you wait before being seen by our physicians.       _____________________________________________________________  Should you have questions after your visit to [facility], please contact our office at [phone] between the hours of 8:30 a.m. and 4:30 p.m.  Voicemails left after 4:30 p.m. will not be returned until the following business day.  For prescription refill requests, have your pharmacy contact our office.       Resources For Cancer Patients and their Caregivers ? American Cancer Society: Can assist with transportation, wigs, general needs, runs Look Good Feel Better.        (954) 076-8790 ? Cancer Care: Provides financial assistance,  online support groups, medication/co-pay assistance.  1-800-813-HOPE 470-143-7916) ? [place] Assists [place] cancer patients and their families through emotional , educational and financial support.  (956) 277-8711 ? [place] DSS Where to apply for food stamps, Medicaid and utility assistance. 825-441-2430 ? [facility]: Transportation to medical appointments. (709) 321-7316 ? Social Security Administration: May apply for disability if have a Stage IV cancer. 928-155-4571 2346567353 ? [employer], Disability and Transit Services: Assists with nutrition, care and transit needs. [place] Support Programs: @10RELATIVEDAYS @ > Cancer Support Group  2nd Tuesday of the month 1pm-2pm, Journey Room  > Creative Journey  3rd Tuesday of the month 1130am-1pm, Journey Room  > Look Good Feel Better  1st Wednesday of the month 10am-12 noon, Journey Room (Call [place] to register [phone])

## 2017-01-22 NOTE — Telephone Encounter (Signed)
FYI:  Patient left message asking [place] to call him. Patient states he went to get biopsy done today and was told that [facility] didn't do them and that he would need to go to [facility] for this. I called patient back to see what he needed from our office he said that they needed a biopsy done to see what type of cancer he had.   I called central scheduling at the hospital and spoke with Nicole Kindred and she stated that [facility] is going to set patient up for a biopsy. It is currently in review to the radiologist.  I called the patient back to advise him that he should hear from either the cancer center or Nicole Kindred once the radiologist had reviewed the request.

## 2017-01-22 NOTE — Progress Notes (Signed)
[place] [facility] Initial Visit:  Patient Care Team: [name], MD as PCP - General (Family Medicine)  CHIEF COMPLAINTS/PURPOSE OF CONSULTATION:  Lymphadenopathy  HISTORY OF PRESENTING ILLNESS: [name] 66 y.o. male is here because of extensive lymphadenopathy.  Patient initially presented to his PCP Dr. Jenna Luo on 01/13/17 for a 3 week history of a painless mass above his right clavicle measuring approximately 6 cm in diameter, is firm, immobile. He denies any associated fevers, chills, drenching night sweats, cough, shortness breath or hemoptysis.  01/16/17: CT chest with contrast performed on 01/16/17 which demonstrated extensive lymphadenopathy in the thorax and lower right cervical region, there was also indeterminate lesion in the periphery of the liver measuring 2.7 x 1.7 cm. 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  A biopsy has not yet been performed at this time. He states that his appetite has been poor and normally he only eats one meal a day and sometimes may go 2-3 days without eating. He denies any weight loss. He also has been having hemorrhoidal bleeding because he has been having constipation. He lives alone and performs all his own ADLs. He has a history of smoking 2 ppd for 50 years but has cut down to 1/2 a ppd since 3 weeks ago. He denies any cough, hemoptysis, shortness of breath, chest pain, abdominal pain, drenching night sweats, unexplained fevers/chills.   Review of Systems  Constitutional: Positive for appetite change. Negative for chills and fever.  HENT:  Negative.   Eyes: Negative.   Respiratory: Negative.   Cardiovascular: Negative.   Gastrointestinal: Positive for abdominal distention,  constipation and rectal pain.  Endocrine: Negative.   Genitourinary: Negative.    Musculoskeletal: Negative.   Skin: Negative.   Neurological: Negative.   Hematological: Negative.   Psychiatric/Behavioral: Negative.     MEDICAL HISTORY: Past Medical History:  Diagnosis Date  . Anxiety   . CAD (coronary artery disease)   . COPD (chronic obstructive pulmonary disease) ([place])   . Depression   . Myocardial infarction ([place])   . Osteopenia   . Panic attacks   . Smoker     SURGICAL HISTORY: Past Surgical History:  Procedure Laterality Date  . BACK SURGERY  12/24/2000   L5,S1  . CORONARY STENT PLACEMENT  2005   RCA & CX  . HERNIA REPAIR Right 1980's  . INGUINAL HERNIA REPAIR  12/1978   right side  . NM MYOCAR PERF WALL MOTION  09/07/2009   No ischemia; EF 51%  . SHOULDER SURGERY Left 08/2010  . SPINE SURGERY  2002   L5-S1    SOCIAL HISTORY: Social History   Social History  . Marital status: Single    Spouse name: N/A  . Number of children: N/A  . Years of education: N/A   Occupational History  . Not on file.   Social History Main Topics  . Smoking status: Current Every Day Smoker    Packs/day: 1.00    Types: Cigarettes  . Smokeless tobacco: Never Used  . Alcohol use No  . Drug use: No  . Sexual activity: Not on file   Other Topics Concern  . Not on file   Social History Narrative  . No narrative on file    FAMILY HISTORY Family History  Problem Relation  Age of Onset  . Heart attack Father   . Kidney disease Father        renal failure  . Heart failure Mother   . Heart attack Mother   . Cancer Brother   . Diabetes Brother   . Alcohol abuse Brother   . Diabetes Sister     ALLERGIES:  is allergic to codeine and niaspan [niacin er].  MEDICATIONS:  Current Outpatient Prescriptions  Medication Sig Dispense Refill  . alendronate (FOSAMAX) 70 MG tablet Take 1 tablet (70 mg total) by mouth every 7 (seven) days. Take with a full glass of water on an  empty stomach. 4 tablet 11  . ALPRAZolam (XANAX) 1 MG tablet Take 1 mg by mouth 4 (four) times daily.    Marland Kitchen aspirin EC 81 MG tablet Take 81 mg by mouth at bedtime.    Marland Kitchen atorvastatin (LIPITOR) 10 MG tablet TAKE 1 TABLET AT BEDTIME  (DISCONTINUE ATORVASTATIN 20MG) 90 tablet 3  . clotrimazole-betamethasone (LOTRISONE) cream Apply 1 application topically 2 (two) times daily. 30 g 0  . co-enzyme Q-10 30 MG capsule Take 1 capsule (30 mg total) by mouth daily. 90 capsule 3  . cyclobenzaprine (FLEXERIL) 10 MG tablet TAKE 1 TABLET THREE TIMES DAILY AS NEEDED FOR MUSCLE SPASMS. 270 tablet 0  . magnesium oxide (MAG-OX) 400 MG tablet Take 400 mg by mouth daily.    . polyethylene glycol powder (GLYCOLAX/MIRALAX) powder Take 17 g by mouth daily. 3350 g 1  . traZODone (DESYREL) 100 MG tablet Take 400 mg by mouth at bedtime.    Marland Kitchen venlafaxine (EFFEXOR) 75 MG tablet Take 150 mg by mouth 2 (two) times daily.      No current facility-administered medications for this visit.     PHYSICAL EXAMINATION:  ECOG PERFORMANCE STATUS: 1 - Symptomatic but completely ambulatory   Vitals:   01/22/17 1320  BP: 126/63  Pulse: 69  Resp: 20  Temp: 97.6 F (36.4 C)    Filed Weights   01/22/17 1320  Weight: 165 lb 12.8 oz (75.2 kg)     Physical Exam  Constitutional: He is oriented to person, place, and time and well-developed, well-nourished, and in no distress. No distress.  HENT:  Head: Normocephalic and atraumatic.  Mouth/Throat: No oropharyngeal exudate.  Eyes: Pupils are equal, round, and reactive to light. Conjunctivae are normal. No scleral icterus.  Neck: Normal range of motion. Neck supple. No JVD present.  Bulky mobile right supraclavicular lymphoadenopathy ~5.5 cm.   Cardiovascular: Normal rate, regular rhythm and normal heart sounds.  Exam reveals no gallop and no friction rub.   No murmur heard. Pulmonary/Chest: Breath sounds normal. No respiratory distress. He has no wheezes. He has no rales.   Abdominal: Soft. Bowel sounds are normal. He exhibits no distension. There is no tenderness. There is no guarding.  Musculoskeletal: He exhibits no edema or tenderness.  Lymphadenopathy:    He has no cervical adenopathy.  Neurological: He is alert and oriented to person, place, and time. No cranial nerve deficit.  Skin: Skin is warm and dry. No rash noted. No erythema. No pallor.  Psychiatric: Affect and judgment normal.     LABORATORY DATA: I have personally reviewed the data as listed:  Office Visit on 01/13/2017  Component Date Value Ref Range Status  . WBC 01/13/2017 6.3  3.8 - 10.8 K/uL Final  . RBC 01/13/2017 4.53  4.20 - 5.80 MIL/uL Final  . Hemoglobin 01/13/2017 13.7  13.0 - 17.0 g/dL Final  .  HCT 01/13/2017 41.2  38.5 - 50.0 % Final  . MCV 01/13/2017 90.9  80.0 - 100.0 fL Final  . MCH 01/13/2017 30.2  27.0 - 33.0 pg Final  . MCHC 01/13/2017 33.3  32.0 - 36.0 g/dL Final  . RDW 01/13/2017 15.5* 11.0 - 15.0 % Final  . Platelets 01/13/2017 229  140 - 400 K/uL Final  . MPV 01/13/2017 10.2  7.5 - 12.5 fL Final  . Neutro Abs 01/13/2017 4221  1,500 - 7,800 cells/uL Final  . Lymphs Abs 01/13/2017 1638  850 - 3,900 cells/uL Final  . Monocytes Absolute 01/13/2017 315  200 - 950 cells/uL Final  . Eosinophils Absolute 01/13/2017 126  15 - 500 cells/uL Final  . Basophils Absolute 01/13/2017 0  0 - 200 cells/uL Final  . Neutrophils Relative % 01/13/2017 67  % Final  . Lymphocytes Relative 01/13/2017 26  % Final  . Monocytes Relative 01/13/2017 5  % Final  . Eosinophils Relative 01/13/2017 2  % Final  . Basophils Relative 01/13/2017 0  % Final  . Smear Review 01/13/2017 Criteria for review not met   Final  . Sodium 01/13/2017 136  135 - 146 mmol/L Final  . Potassium 01/13/2017 4.4  3.5 - 5.3 mmol/L Final  . Chloride 01/13/2017 105  98 - 110 mmol/L Final  . CO2 01/13/2017 25  20 - 31 mmol/L Final  . Glucose, Bld 01/13/2017 87  70 - 99 mg/dL Final  . BUN 01/13/2017 11  7 - 25 mg/dL  Final  . Creat 01/13/2017 0.96  0.70 - 1.25 mg/dL Final   Comment:   For patients > or = 66 years of age: The upper reference limit for Creatinine is approximately 13% higher for people identified as African-American.     . Total Bilirubin 01/13/2017 0.5  0.2 - 1.2 mg/dL Final  . Alkaline Phosphatase 01/13/2017 66  40 - 115 U/L Final  . AST 01/13/2017 13  10 - 35 U/L Final  . ALT 01/13/2017 8* 9 - 46 U/L Final  . Total Protein 01/13/2017 6.6  6.1 - 8.1 g/dL Final  . Albumin 01/13/2017 3.7  3.6 - 5.1 g/dL Final  . Calcium 01/13/2017 8.7  8.6 - 10.3 mg/dL Final  . GFR, Est African American 01/13/2017 >89  >=60 mL/min Final  . GFR, Est Non African American 01/13/2017 83  >=60 mL/min Final    RADIOGRAPHIC STUDIES: I have personally reviewed the radiological images as listed and agree with the findings in the report  No results found.  ASSESSMENT: Extensive lymphadenopathy in the neck and thorax. Differential include tumor from lung malignancy vs. Lymphoma.  PLAN: We have ordered an ultrasound-guided biopsy of his right supraclavicular bulky mass for definitive tissue diagnosis. High suspicion for malignancy. PET scan has been ordered and is scheduled for 02/02/17. High suspicion for cancer, therefore I will get a chemotherapy port placed so that we may start his treatment ASAP once all of his workup is complete.  RTC in 2 weeks for follow up.   All questions were answered. The patient knows to call the clinic with any problems, questions or concerns.  This note was electronically signed.    Twana First, MD  01/22/2017 1:01 PM

## 2017-02-02 ENCOUNTER — Other Ambulatory Visit: Payer: Self-pay | Admitting: Physician Assistant

## 2017-02-02 ENCOUNTER — Encounter (HOSPITAL_COMMUNITY)
Admission: RE | Admit: 2017-02-02 | Discharge: 2017-02-02 | Disposition: A | Discharge status: Home / Self Care | Payer: Medicare PPO | Source: Ambulatory Visit | Attending: Oncology | Admitting: Oncology

## 2017-02-02 DIAGNOSIS — R591 Generalized enlarged lymph nodes: Secondary | ICD-10-CM | POA: Insufficient documentation

## 2017-02-02 DIAGNOSIS — R590 Localized enlarged lymph nodes: Secondary | ICD-10-CM | POA: Diagnosis not present

## 2017-02-02 DIAGNOSIS — C8590 Non-Hodgkin lymphoma, unspecified, unspecified site: Secondary | ICD-10-CM | POA: Diagnosis not present

## 2017-02-02 LAB — GLUCOSE, CAPILLARY: Glucose-Capillary: 105 mg/dL — ABNORMAL HIGH (ref 65–99)

## 2017-02-02 MED ORDER — FLUDEOXYGLUCOSE F - 18 (FDG) INJECTION
7.6100 | Freq: Once | INTRAVENOUS | Status: AC | PRN
  Administered 2017-02-02: 7.61 via INTRAVENOUS

## 2017-02-03 ENCOUNTER — Ambulatory Visit (HOSPITAL_COMMUNITY)
Admission: RE | Admit: 2017-02-03 | Discharge: 2017-02-03 | Disposition: A | Discharge status: Home / Self Care | Payer: Medicare PPO | Source: Ambulatory Visit | Attending: Oncology | Admitting: Oncology

## 2017-02-03 ENCOUNTER — Other Ambulatory Visit (HOSPITAL_COMMUNITY): Payer: Self-pay | Admitting: Oncology

## 2017-02-03 ENCOUNTER — Encounter (HOSPITAL_COMMUNITY): Payer: Self-pay

## 2017-02-03 DIAGNOSIS — Z7982 Long term (current) use of aspirin: Secondary | ICD-10-CM | POA: Insufficient documentation

## 2017-02-03 DIAGNOSIS — F419 Anxiety disorder, unspecified: Secondary | ICD-10-CM | POA: Insufficient documentation

## 2017-02-03 DIAGNOSIS — C770 Secondary and unspecified malignant neoplasm of lymph nodes of head, face and neck: Secondary | ICD-10-CM | POA: Diagnosis not present

## 2017-02-03 DIAGNOSIS — F17210 Nicotine dependence, cigarettes, uncomplicated: Secondary | ICD-10-CM | POA: Insufficient documentation

## 2017-02-03 DIAGNOSIS — F329 Major depressive disorder, single episode, unspecified: Secondary | ICD-10-CM | POA: Diagnosis not present

## 2017-02-03 DIAGNOSIS — R221 Localized swelling, mass and lump, neck: Secondary | ICD-10-CM | POA: Diagnosis not present

## 2017-02-03 DIAGNOSIS — R591 Generalized enlarged lymph nodes: Secondary | ICD-10-CM

## 2017-02-03 DIAGNOSIS — C801 Malignant (primary) neoplasm, unspecified: Secondary | ICD-10-CM | POA: Diagnosis not present

## 2017-02-03 DIAGNOSIS — M8580 Other specified disorders of bone density and structure, unspecified site: Secondary | ICD-10-CM | POA: Diagnosis not present

## 2017-02-03 DIAGNOSIS — J449 Chronic obstructive pulmonary disease, unspecified: Secondary | ICD-10-CM | POA: Diagnosis not present

## 2017-02-03 DIAGNOSIS — I252 Old myocardial infarction: Secondary | ICD-10-CM | POA: Insufficient documentation

## 2017-02-03 DIAGNOSIS — I2510 Atherosclerotic heart disease of native coronary artery without angina pectoris: Secondary | ICD-10-CM | POA: Diagnosis not present

## 2017-02-03 DIAGNOSIS — C499 Malignant neoplasm of connective and soft tissue, unspecified: Secondary | ICD-10-CM | POA: Diagnosis not present

## 2017-02-03 DIAGNOSIS — Z955 Presence of coronary angioplasty implant and graft: Secondary | ICD-10-CM | POA: Insufficient documentation

## 2017-02-03 DIAGNOSIS — I700 Atherosclerosis of aorta: Secondary | ICD-10-CM | POA: Diagnosis not present

## 2017-02-03 DIAGNOSIS — R590 Localized enlarged lymph nodes: Secondary | ICD-10-CM | POA: Diagnosis present

## 2017-02-03 HISTORY — PX: IR US GUIDE BX ASP/DRAIN: IMG2392

## 2017-02-03 LAB — CBC
HCT: 41.1 % (ref 39.0–52.0)
Hemoglobin: 14.5 g/dL (ref 13.0–17.0)
MCH: 30.5 pg (ref 26.0–34.0)
MCHC: 35.3 g/dL (ref 30.0–36.0)
MCV: 86.5 fL (ref 78.0–100.0)
Platelets: 219 10*3/uL (ref 150–400)
RBC: 4.75 MIL/uL (ref 4.22–5.81)
RDW: 15 % (ref 11.5–15.5)
WBC: 7.4 10*3/uL (ref 4.0–10.5)

## 2017-02-03 LAB — APTT: aPTT: 30 seconds (ref 24–36)

## 2017-02-03 LAB — PROTIME-INR
INR: 1.11
Prothrombin Time: 14.4 seconds (ref 11.4–15.2)

## 2017-02-03 MED ORDER — LIDOCAINE HCL 1 % IJ SOLN
INTRAMUSCULAR | Status: AC
  Filled 2017-02-03: qty 20

## 2017-02-03 MED ORDER — LIDOCAINE-EPINEPHRINE (PF) 2 %-1:200000 IJ SOLN
INTRAMUSCULAR | Status: AC
  Filled 2017-02-03: qty 20

## 2017-02-03 MED ORDER — HEPARIN SOD (PORK) LOCK FLUSH 100 UNIT/ML IV SOLN
INTRAVENOUS | Status: AC
  Filled 2017-02-03: qty 5

## 2017-02-03 MED ORDER — FENTANYL CITRATE (PF) 100 MCG/2ML IJ SOLN
INTRAMUSCULAR | Status: AC | PRN
  Administered 2017-02-03: 25 ug via INTRAVENOUS

## 2017-02-03 MED ORDER — FENTANYL CITRATE (PF) 100 MCG/2ML IJ SOLN
INTRAMUSCULAR | Status: AC
  Filled 2017-02-03: qty 4

## 2017-02-03 MED ORDER — MIDAZOLAM HCL 2 MG/2ML IJ SOLN
INTRAMUSCULAR | Status: AC | PRN
  Administered 2017-02-03 (×2): 1 mg via INTRAVENOUS

## 2017-02-03 MED ORDER — CEFAZOLIN SODIUM-DEXTROSE 2-4 GM/100ML-% IV SOLN
INTRAVENOUS | Status: DC
Start: 2017-02-03 — End: 2017-02-03
  Filled 2017-02-03: qty 100

## 2017-02-03 MED ORDER — MIDAZOLAM HCL 2 MG/2ML IJ SOLN
INTRAMUSCULAR | Status: AC
  Filled 2017-02-03: qty 6

## 2017-02-03 MED ORDER — SODIUM CHLORIDE 0.9 % IV SOLN
INTRAVENOUS | Status: DC
  Administered 2017-02-03: 11:00:00 via INTRAVENOUS

## 2017-02-03 MED ORDER — CEFAZOLIN SODIUM-DEXTROSE 2-4 GM/100ML-% IV SOLN: 2.0000 g | Freq: Once | INTRAVENOUS | Status: DC

## 2017-02-03 NOTE — H&P (Signed)
Chief Complaint: Patient was seen in consultation today for supraclavicular lymph node biopsy, Port-A-Cath placement  Referring Physician(s): Zhou,Louise  Supervising Physician: Markus Daft  Patient Status: [facility] - Out-pt  History of Present Illness: TYLAND KLEMENS is a 66 y.o. male with past medical history of CAD, COPD, MI, osteopenia who presented to his PCP with enlarged lymph node above the right clavicle.   Patient underwent CT scan of Chest and Neck 01/16/17 which showed:  Chest 1. Extensive lymphadenopathy in the thorax and lower right cervical region, as discussed above. Primary differential considerations include lymphoma/leukemia or small cell carcinoma of the lung. Further evaluation a PET-CT could be considered to assess for additional sites of disease below the diaphragm if clinically appropriate. Additionally, ultrasound-guided biopsy of supraclavicular lymphadenopathy could be considered to establish a tissue diagnosis. 2. Indeterminate lesion in the periphery of segment 8 of the liver measuring 2.7 x 1.7 cm. Attention at time of follow-up PET-CT is recommended. 3. Aortic atherosclerosis, in addition to left main and 3 vessel coronary artery disease. Please note that although the presence of coronary artery calcium documents the presence of coronary artery disease, the severity of this disease and any potential stenosis cannot be assessed on this non-gated CT examination. Assessment for potential risk factor modification, dietary therapy or pharmacologic therapy may be warranted, if clinically indicated. 4. There are calcifications of the aortic valve. Echocardiographic correlation for evaluation of potential valvular dysfunction may be warranted if clinically indicated. 5. Diffuse bronchial wall thickening with moderate centrilobular and paraseptal emphysema; imaging findings suggestive of underlying COPD.  Neck 1. Bulky 5.4 cm right supraclavicular region malignant lymph  node conglomeration with extracapsular extension. 2. Surrounding smaller abnormal right level 3 and level 5 lymph nodes, and the lymphadenopathy continues into the superior mediastinum, see Chest CT findings reported separately. 3. No other metastatic disease identified in the neck.  PET Scan 02/02/17 showed: 1. Hypermetabolic right cervical, bilateral supraclavicular, mediastinal, right hilar, and right internal mammary lymphadenopathy is associated with a hypermetabolic right liver lesion. The right parahilar lesion appears to extend in a confluent fashion into the anterior right hilum raising the question of non-small-cell lung cancer. Lymphoma not excluded although liver lesion makes this somewhat less likely. Metastatic disease from unknown primary remains a consideration. 2.  Emphysema. (ICD10-J43.9) 3.  Aortic Atherosclerois (ICD10-170.0)   IR consulted for supraclavicular lymph node biopsy at the request of Dr. Talbert Cage.   Patient presents today in his usual state of health.  He has been NPO.  He does not take blood thinners.   Past Medical History:  Diagnosis Date  . Anxiety   . CAD (coronary artery disease)   . COPD (chronic obstructive pulmonary disease) ([place])   . Depression   . Myocardial infarction ([place])   . Osteopenia   . Panic attacks   . Smoker     Past Surgical History:  Procedure Laterality Date  . BACK SURGERY  12/24/2000   L5,S1  . CORONARY STENT PLACEMENT  2005   RCA & CX  . HERNIA REPAIR Right 1980's  . INGUINAL HERNIA REPAIR  12/1978   right side  . NM MYOCAR PERF WALL MOTION  09/07/2009   No ischemia; EF 51%  . SHOULDER SURGERY Left 08/2010  . SPINE SURGERY  2002   L5-S1    Allergies: Codeine and Niaspan [niacin er]  Medications: Prior to Admission medications   Medication Sig Start Date End Date Taking? Authorizing Provider  alendronate (FOSAMAX) 70 MG tablet  Take 1 tablet (70 mg total) by mouth every 7 (seven) days. Take with a full glass of  water on an empty stomach. 03/23/15   [name], MD  ALPRAZolam Duanne Moron) 1 MG tablet Take 1 mg by mouth 4 (four) times daily.    [provider]  aspirin EC 81 MG tablet Take 81 mg by mouth at bedtime.    [provider]  atorvastatin (LIPITOR) 10 MG tablet TAKE 1 TABLET AT BEDTIME  (DISCONTINUE ATORVASTATIN 20MG ) 06/20/16   Croitoru, Mihai, MD  clotrimazole-betamethasone (LOTRISONE) cream Apply 1 application topically 2 (two) times daily. 03/18/16   [name], MD  co-enzyme Q-10 30 MG capsule Take 1 capsule (30 mg total) by mouth daily. 05/03/15   Croitoru, Mihai, MD  cyclobenzaprine (FLEXERIL) 10 MG tablet TAKE 1 TABLET THREE TIMES DAILY AS NEEDED FOR MUSCLE SPASMS. 09/15/16   [name], MD  magnesium oxide (MAG-OX) 400 MG tablet Take 400 mg by mouth daily.    [provider]  polyethylene glycol powder (GLYCOLAX/MIRALAX) powder Take 17 g by mouth daily. 09/25/16   [name], MD  traZODone (DESYREL) 100 MG tablet Take 400 mg by mouth at bedtime. 12/28/13   [provider]  venlafaxine (EFFEXOR) 75 MG tablet Take 150 mg by mouth 2 (two) times daily.     [provider]     Family History  Problem Relation Age of Onset  . Heart attack Father   . Kidney disease Father        renal failure  . Heart failure Mother   . Heart attack Mother   . Cancer Brother   . Diabetes Brother   . Alcohol abuse Brother   . Diabetes Sister     Social History   Social History  . Marital status: Single    Spouse name: N/A  . Number of children: N/A  . Years of education: N/A   Social History Main Topics  . Smoking status: Current Every Day Smoker    Packs/day: 1.00    Types: Cigarettes  . Smokeless tobacco: Never Used  . Alcohol use No  . Drug use: No  . Sexual activity: Not on file   Other Topics Concern  . Not on file   Social History Narrative  . No narrative on file     Review of Systems  Constitutional: Negative for  fatigue and fever.  Respiratory: Negative for cough and shortness of breath.   Cardiovascular: Negative for chest pain.  Psychiatric/Behavioral: Negative for behavioral problems and confusion.    Vital Signs: There were no vitals taken for this visit.  Physical Exam  Constitutional: He is oriented to person, place, and time. He appears well-developed.  Neck:  Large, palpable supraclavicular lymph node   Cardiovascular: Normal rate, regular rhythm and normal heart sounds.   Pulmonary/Chest: Effort normal and breath sounds normal. No respiratory distress.  Lymphadenopathy:    He has cervical adenopathy.  Neurological: He is alert and oriented to person, place, and time.  Skin: Skin is warm and dry.  Psychiatric: He has a normal mood and affect. His behavior is normal. Judgment and thought content normal.  Nursing note and vitals reviewed.   Mallampati Score:  MD Evaluation Airway: WNL Heart: WNL Abdomen: WNL Chest/ Lungs: WNL ASA  Classification: 3 Mallampati/Airway Score: Two  Imaging: Ct Soft Tissue Neck W Contrast  Result Date: 01/16/2017 CLINICAL DATA:  66 year old male with right neck mass for 3 weeks. Supraclavicular lymphadenopathy.  EXAM: CT NECK WITH CONTRAST TECHNIQUE: Multidetector CT imaging of the neck was performed using the standard protocol following the bolus administration of intravenous contrast. CONTRAST:  65mL ISOVUE-300 IOPAMIDOL (ISOVUE-300) INJECTION 61% in conjunction with contrast enhanced imaging of the chest reported separately. COMPARISON:  Thoracic spine radiographs 09/04/2015. Chest CT today reported separately. FINDINGS: Pharynx and larynx: Laryngeal and pharyngeal soft tissue contours are within normal limits. Negative parapharyngeal spaces. Negative retropharyngeal space. Salivary glands: Sublingual space and submandibular glands are within normal limits. There is evidence of a small 9 x 12 mm soft tissue nodule at the posterior inferior most aspect  of the right parotid gland (series 5, image 46 and series 12 image 26). The remainder of the right parotid appears normal. Thyroid: Negative thyroid. Lymph nodes: Bulky, lobulated, and heterogeneously enhancing abnormal lymph nodal conglomeration in the right supraclavicular region encompasses 43 x 54 x 39 mm (AP by transverse by CC) with smaller surrounding asymmetrically increased and abnormally rounded lymph nodes. Right level IIIb and right level 5 nodes primarily are affected, but the abnormal lymph nodes continue to the right level 4 station and the right thoracic inlet continuing into the right peritracheal mediastinal nodal station (series 5, image 105). There is mild mass effect on the posterolateral right carotid space, but the right carotid space vasculature remains patent. The right level 2 and the level 1 nodal stations are uninvolved. The left neck lymph node stations are also within normal limits. Vascular: Major vascular structures in the neck and at the skullbase remain patent, including the right IJ. Limited intracranial: Negative. Visualized orbits: Negative. Mastoids and visualized paranasal sinuses: Scattered ethmoid and visible frontal sinus mucosal thickening. Other visible paranasal sinuses and mastoids are well pneumatized. Skeleton: Absent dentition. Mild for age degenerative changes in the cervical spine. No acute or suspicious osseous lesion identified in the neck or at the skullbase. Upper chest: Chest findings today are reported separately. IMPRESSION: 1. Bulky 5.4 cm right supraclavicular region malignant lymph node conglomeration with extracapsular extension. 2. Surrounding smaller abnormal right level 3 and level 5 lymph nodes, and the lymphadenopathy continues into the superior mediastinum, see Chest CT findings reported separately. 3. No other metastatic disease identified in the neck. Electronically Signed   By: Genevie Ann M.D.   On: 01/16/2017 14:50   Ct Chest W Contrast  Result  Date: 01/16/2017 CLINICAL DATA:  66 year old male with history of right-sided neck mass and supraclavicular lymphadenopathy. EXAM: CT CHEST WITH CONTRAST TECHNIQUE: Multidetector CT imaging of the chest was performed during intravenous contrast administration. CONTRAST:  67mL ISOVUE-300 IOPAMIDOL (ISOVUE-300) INJECTION 61% COMPARISON:  No priors. FINDINGS: Cardiovascular: Heart size is normal. There is no significant pericardial fluid, thickening or pericardial calcification. There is aortic atherosclerosis, as well as atherosclerosis of the great vessels of the mediastinum and the coronary arteries, including calcified atherosclerotic plaque in the left main, left anterior descending, left circumflex and right coronary arteries. Thickening calcification of the aortic valve. Mediastinum/Nodes: Extensive right hilar, mediastinal, internal mammary, right supraclavicular and lower cervical lymphadenopathy is noted. Specific examples include the following. 3.2 cm short axis right paratracheal lymph node (axial image 64 of series 3). Right hilar lymph nodes measuring up to 1.5 cm in short axis. Prevascular lymph nodes measuring up to 1.5 cm in short axis (axial image 51 of series 3). Right internal mammary lymph node measuring up to 1.1 cm in short axis. Right supraclavicular lymph node measuring up to 2 cm in short axis. Esophagus is unremarkable in appearance.  No axillary lymphadenopathy. Lungs/Pleura: 6 mm ground-glass attenuation nodule in the right upper lobe near the apex (axial image 36 of series 8). No other larger more suspicious appearing pulmonary nodules or masses are noted. Diffuse bronchial wall thickening with moderate centrilobular and paraseptal emphysema. No acute consolidative airspace disease. No pleural effusions. Upper Abdomen: Poorly defined 2.7 x 1.7 cm intermediate attenuation lesion in the periphery of segment 8 of the liver (axial image 163 of series 3). Simple cysts in the left kidney measuring  up to 1.6 cm. Aortic atherosclerosis. Musculoskeletal: There are no aggressive appearing lytic or blastic lesions noted in the visualized portions of the skeleton. IMPRESSION: 1. Extensive lymphadenopathy in the thorax and lower right cervical region, as discussed above. Primary differential considerations include lymphoma/leukemia or small cell carcinoma of the lung. Further evaluation a PET-CT could be considered to assess for additional sites of disease below the diaphragm if clinically appropriate. Additionally, ultrasound-guided biopsy of supraclavicular lymphadenopathy could be considered to establish a tissue diagnosis. 2. Indeterminate lesion in the periphery of segment 8 of the liver measuring 2.7 x 1.7 cm. Attention at time of follow-up PET-CT is recommended. 3. Aortic atherosclerosis, in addition to left main and 3 vessel coronary artery disease. Please note that although the presence of coronary artery calcium documents the presence of coronary artery disease, the severity of this disease and any potential stenosis cannot be assessed on this non-gated CT examination. Assessment for potential risk factor modification, dietary therapy or pharmacologic therapy may be warranted, if clinically indicated. 4. There are calcifications of the aortic valve. Echocardiographic correlation for evaluation of potential valvular dysfunction may be warranted if clinically indicated. 5. Diffuse bronchial wall thickening with moderate centrilobular and paraseptal emphysema; imaging findings suggestive of underlying COPD. 6. Additional incidental findings, as above. Aortic Atherosclerosis (ICD10-I70.0) and Emphysema (ICD10-J43.9). Electronically Signed   By: Vinnie Langton M.D.   On: 01/16/2017 14:54   Nm Pet Image Initial (pi) Skull Base To Thigh  Result Date: 02/02/2017 CLINICAL DATA:  Initial treatment strategy for lymphoma. EXAM: NUCLEAR MEDICINE PET SKULL BASE TO THIGH TECHNIQUE: 7.6 mCi F-18 FDG was injected  intravenously. Full-ring PET imaging was performed from the skull base to thigh after the radiotracer. CT data was obtained and used for attenuation correction and anatomic localization. FASTING BLOOD GLUCOSE:  Value: 105 mg/dl COMPARISON:  Chest CT 01/16/2017 FINDINGS: NECK Right-sided level II cervical lymph node measures 12 mm short axis (image 25 series for) with SUV max = 8.5. Hypermetabolic supraclavicular lymphadenopathy is seen bilaterally. The conglomeration of lymph nodes in the right supraclavicular region has progressed in the interval in the 2.0 cm short axis node measured on the previous study is now 2.7 cm. SUV max = 17.5 for this right supraclavicular conglomeration of lymph nodes. CHEST The mediastinal, internal mammary and right hilar lymphadenopathy noted previously is hypermetabolic. The 3.2 cm short axis right paratracheal lymph node measured on the previous study is now 3.9 cm and demonstrates SUV max = 16. Lung windows show emphysema. No pulmonary edema or pleural effusion. ABDOMEN/PELVIS 2.5 cm lesion in the lateral right liver (segment VIII) is hypermetabolic with SUV max = 11. Small left renal lesions are probably a combination of simple and complex cysts. There is abdominal aortic atherosclerosis without aneurysm. Left colonic diverticulosis noted without diverticulitis. SKELETON No focal hypermetabolic activity to suggest skeletal metastasis. IMPRESSION: 1. Hypermetabolic right cervical, bilateral supraclavicular, mediastinal, right hilar, and right internal mammary lymphadenopathy is associated with a hypermetabolic right liver lesion. The  right parahilar lesion appears to extend in a confluent fashion into the anterior right hilum raising the question of non-small-cell lung cancer. Lymphoma not excluded although liver lesion makes this somewhat less likely. Metastatic disease from unknown primary remains a consideration. 2.  Emphysema. (ICD10-J43.9) 3.  Aortic Atherosclerois  (ICD10-170.0) Electronically Signed   By: Misty Stanley M.D.   On: 02/02/2017 15:02    Labs:  CBC:  Recent Labs  01/13/17 1546 02/03/17 1114  WBC 6.3 7.4  HGB 13.7 14.5  HCT 41.2 41.1  PLT 229 219    COAGS:  Recent Labs  02/03/17 1114  INR 1.11  APTT 30    BMP:  Recent Labs  01/13/17 1546  NA 136  K 4.4  CL 105  CO2 25  GLUCOSE 87  BUN 11  CALCIUM 8.7  CREATININE 0.96  GFRNONAA 83  GFRAA >89    LIVER FUNCTION TESTS:  Recent Labs  01/13/17 1546  BILITOT 0.5  AST 13  ALT 8*  ALKPHOS 66  PROT 6.6  ALBUMIN 3.7    TUMOR MARKERS: No results for input(s): AFPTM, CEA, CA199, CHROMGRNA in the last 8760 hours.  Assessment and Plan: Patient with history anxiety, depression, COPD, and MI who presents with recent lymphadenopathy. IR consulted for supraclavicular lymph node biopsy and Port-A-Cath placement at the request of Dr. Talbert Cage. Patient presents today in his usual state of health.  He has been NPO. He does not take blood thinners.  Risks and benefits discussed with the patient including, but not limited to bleeding, infection, damage to adjacent structures or low yield requiring additional tests. All of the patient's questions were answered, patient is agreeable to proceed. Consent signed and in chart. Risks and benefits discussed with the patient including, but not limited to bleeding, infection, pneumothorax, or fibrin sheath development and need for additional procedures. All of the patient's questions were answered, patient is agreeable to proceed. Consent signed and in chart.  Thank you for this interesting consult.  I greatly enjoyed meeting JAILIN MOOMAW and look forward to participating in their care.  A copy of this report was sent to the requesting provider on this date.  Electronically Signed: Docia Barrier, PA 02/03/2017, 12:55 PM   I spent a total of  30 Minutes   in face to face in clinical consultation, greater than 50% of  which was counseling/coordinating care for lymphadenopathy.

## 2017-02-03 NOTE — Discharge Instructions (Addendum)
Moderate Conscious Sedation, Adult, Care After These instructions provide you with information about caring for yourself after your procedure. Your health care provider may also give you more specific instructions. Your treatment has been planned according to current medical practices, but problems sometimes occur. Call your health care provider if you have any problems or questions after your procedure. What can I expect after the procedure? After your procedure, it is common:  To feel sleepy for several hours.  To feel clumsy and have poor balance for several hours.  To have poor judgment for several hours.  To vomit if you eat too soon.  Follow these instructions at home: For at least 24 hours after the procedure:   Do not: ? Participate in activities where you could fall or become injured. ? Drive. ? Use heavy machinery. ? Drink alcohol. ? Take sleeping pills or medicines that cause drowsiness. ? Make important decisions or sign legal documents. ? Take care of children on your own.  Rest. Eating and drinking  Follow the diet recommended by your health care provider.  If you vomit: ? Drink water, juice, or soup when you can drink without vomiting. ? Make sure you have little or no nausea before eating solid foods. General instructions  Have a responsible adult stay with you until you are awake and alert.  Take over-the-counter and prescription medicines only as told by your health care provider.  If you smoke, do not smoke without supervision.  Keep all follow-up visits as told by your health care provider. This is important. Contact a health care provider if:  You keep feeling nauseous or you keep vomiting.  You feel light-headed.  You develop a rash.  You have a fever. Get help right away if:  You have trouble breathing. This information is not intended to replace advice given to you by your health care provider. Make sure you discuss any questions you have  with your health care provider. Document Released: 04/20/2013 Document Revised: 12/03/2015 Document Reviewed: 10/20/2015 Elsevier Interactive Patient Education  2018 [employer].  Needle Biopsy, Care After These instructions give you information about caring for yourself after your procedure. Your doctor may also give you more specific instructions. Call your doctor if you have any problems or questions after your procedure. Follow these instructions at home:  Rest as told by your doctor.  Take medicines only as told by your doctor.  There are many different ways to close and cover the biopsy site, including stitches (sutures), skin glue, and adhesive strips. Follow instructions from your doctor about: ? How to take care of your biopsy site. ? When and how you should change your bandage (dressing). ? When you should remove your dressing. ? Removing whatever was used to close your biopsy site.  Check your biopsy site every day for signs of infection. Watch for: ? Redness, swelling, or pain. ? Fluid, blood, or pus. Contact a doctor if:  You have a fever.  You have redness, swelling, or pain at the biopsy site, and it lasts longer than a few days.  You have fluid, blood, or pus coming from the biopsy site.  You feel sick to your stomach (nauseous).  You throw up (vomit). Get help right away if:  You are short of breath.  You have trouble breathing.  Your chest hurts.  You feel dizzy or you pass out (faint).  You have bleeding that does not stop with pressure or a bandage.  You cough up blood.  Your belly (abdomen) hurts. This information is not intended to replace advice given to you by your health care provider. Make sure you discuss any questions you have with your health care provider. Document Released: 06/12/2008 Document Revised: 12/06/2015 Document Reviewed: 06/26/2014 Elsevier Interactive Patient Education  [employer].

## 2017-02-03 NOTE — Sedation Documentation (Signed)
Patient denies pain and is resting comfortably.  

## 2017-02-03 NOTE — Procedures (Signed)
US guided core biopsies of right neck mass/lymph nodes.  5 cores obtained.  Minimal blood loss and no immediate complication.  Specimens placed in formalin and saline.

## 2017-02-05 ENCOUNTER — Telehealth (HOSPITAL_COMMUNITY): Payer: Self-pay | Admitting: Oncology

## 2017-02-05 ENCOUNTER — Ambulatory Visit (HOSPITAL_COMMUNITY): Payer: Medicare PPO

## 2017-02-05 DIAGNOSIS — C3490 Malignant neoplasm of unspecified part of unspecified bronchus or lung: Secondary | ICD-10-CM | POA: Insufficient documentation

## 2017-02-05 MED ORDER — DEXAMETHASONE 4 MG PO TABS: ORAL_TABLET | ORAL | 1 refills | Status: DC

## 2017-02-05 MED ORDER — ONDANSETRON HCL 8 MG PO TABS: 8.0000 mg | ORAL_TABLET | Freq: Two times a day (BID) | ORAL | 1 refills | Status: DC | PRN

## 2017-02-05 MED ORDER — LIDOCAINE-PRILOCAINE 2.5-2.5 % EX CREA: TOPICAL_CREAM | CUTANEOUS | 3 refills | Status: DC

## 2017-02-05 MED ORDER — LORAZEPAM 0.5 MG PO TABS: 0.5000 mg | ORAL_TABLET | Freq: Four times a day (QID) | ORAL | 0 refills | Status: DC | PRN

## 2017-02-05 MED ORDER — PROCHLORPERAZINE MALEATE 10 MG PO TABS: 10.0000 mg | ORAL_TABLET | Freq: Four times a day (QID) | ORAL | 1 refills | Status: DC | PRN

## 2017-02-05 NOTE — Progress Notes (Signed)
[place], [place] [address]   CLINIC:  Medical Oncology/Hematology  PCP:  [name], MD [address] 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 Bulky 5.4 cm right supraclavicular region malignant lymph node conglomeration with extracapsular extension. 2. Surrounding smaller abnormal right level 3 and level 5 lymph nodes, and the lymphadenopathy continues into the superior mediastinum, see Chest CT findings reported separately. 3. No other metastatic disease identified in the neck.      01/16/2017 Imaging    CT chest: IMPRESSION: 1. Extensive lymphadenopathy in the thorax and lower right cervical region, as discussed above. Primary differential considerations include lymphoma/leukemia or small cell carcinoma of the lung. Further evaluation a PET-CT could be considered to assess for additional sites of disease below the diaphragm if clinically appropriate. Additionally, ultrasound-guided biopsy of supraclavicular lymphadenopathy could be considered to establish a tissue diagnosis. 2. Indeterminate lesion in the periphery of segment 8 of the liver measuring 2.7 x 1.7 cm. Attention at time of follow-up PET-CT is recommended. 3. Aortic atherosclerosis, in addition to left main and 3 vessel coronary artery disease. Please note that although the presence of coronary artery calcium documents the presence of coronary artery disease, the severity of this disease and any potential stenosis cannot be assessed on this non-gated CT examination. Assessment for potential risk factor modification, dietary therapy or pharmacologic therapy may be warranted, if clinically  indicated. 4. There are calcifications of the aortic valve. Echocardiographic correlation for evaluation of potential valvular dysfunction may be warranted if clinically indicated. 5. Diffuse bronchial wall thickening with moderate centrilobular and paraseptal emphysema; imaging findings suggestive of underlying COPD.      02/03/2017 Initial Biopsy    (R) neck lymph node biopsy: SMALL CELL CARCINOMA (most likely lung primary).       02/03/2017 Miscellaneous    Port-a-cath attempted by IR; unable to place d/t enlarged SVC.       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 male returns to cancer center for follow-up and to discuss treatment options for newly diagnosed extensive stage small cell lung cancer.   He is here today with his brother.  He is aware that he has lung cancer after receiving a call from Dr. Talbert Cage yesterday. He is here today discuss his diagnosis further & treatment options.    Symptomatically, endorses occasional trouble swallowing from (R) neck mass; he also has some trouble chewing.  Endorses occasional constipation as well.  Appetite is 25%; energy levels 75%.  Denies any headaches or dizziness. Denies cough or shortness of breath.      REVIEW OF SYSTEMS:  Review of Systems  Constitutional: Positive for fatigue. Negative for chills and fever.  HENT:   Positive for trouble swallowing. Negative for lump/mass and nosebleeds.   Eyes: Negative.   Respiratory: Negative.  Negative for cough and shortness of breath.   Cardiovascular: Negative.  Negative for chest pain and leg swelling.  Gastrointestinal: Positive for constipation. Negative for abdominal pain, blood in stool, diarrhea, nausea and vomiting.  Endocrine: Negative.  Genitourinary: Negative.  Negative for dysuria and hematuria.   Musculoskeletal: Negative.  Negative for arthralgias.  Skin: Negative.  Negative  for rash.  Neurological: Negative.  Negative for dizziness and headaches.  Hematological: Negative.  Negative for adenopathy. Does not bruise/bleed easily.  Psychiatric/Behavioral: Negative.  Negative for depression and sleep disturbance. The patient is not nervous/anxious.      PAST MEDICAL/SURGICAL HISTORY:  Past Medical History:  Diagnosis Date  . Anxiety   . CAD (coronary artery disease)   . COPD (chronic obstructive pulmonary disease) ([place])   . Depression   . Myocardial infarction ([place])   . Osteopenia   . Panic attacks   . Smoker    Past Surgical History:  Procedure Laterality Date  . BACK SURGERY  12/24/2000   L5,S1  . CORONARY STENT PLACEMENT  2005   RCA & CX  . HERNIA REPAIR Right 1980's  . INGUINAL HERNIA REPAIR  12/1978   right side  . IR US GUIDE BX ASP/DRAIN  02/03/2017  . NM MYOCAR PERF WALL MOTION  09/07/2009   No ischemia; EF 51%  . SHOULDER SURGERY Left 08/2010  . SPINE SURGERY  2002   L5-S1     SOCIAL HISTORY:  Social History   Social History  . Marital status: Single    Spouse name: N/A  . Number of children: N/A  . Years of education: N/A   Occupational History  . Not on file.   Social History Main Topics  . Smoking status: Current Every Day Smoker    Packs/day: 1.00    Types: Cigarettes  . Smokeless tobacco: Never Used  . Alcohol use No  . Drug use: No  . Sexual activity: Not on file   Other Topics Concern  . Not on file   Social History Narrative  . No narrative on file    FAMILY HISTORY:  Family History  Problem Relation Age of Onset  . Heart attack Father   . Kidney disease Father        renal failure  . Heart failure Mother   . Heart attack Mother   . Cancer Brother   . Diabetes Brother   . Alcohol abuse Brother   . Diabetes Sister     CURRENT MEDICATIONS:  Outpatient Encounter Prescriptions as of 02/06/2017  Medication Sig  . alendronate (FOSAMAX) 70 MG tablet Take 1 tablet (70 mg total) by mouth every 7 (seven)  days. Take with a full glass of water on an empty stomach.  . ALPRAZolam (XANAX) 1 MG tablet Take 1 mg by mouth 4 (four) times daily.  Marland Kitchen aspirin EC 81 MG tablet Take 81 mg by mouth at bedtime.  Marland Kitchen atorvastatin (LIPITOR) 10 MG tablet TAKE 1 TABLET AT BEDTIME  (DISCONTINUE ATORVASTATIN 20MG )  . clotrimazole-betamethasone (LOTRISONE) cream Apply 1 application topically 2 (two) times daily.  Marland Kitchen co-enzyme Q-10 30 MG capsule Take 1 capsule (30 mg total) by mouth daily.  . cyclobenzaprine (FLEXERIL) 10 MG tablet TAKE 1 TABLET THREE TIMES DAILY AS NEEDED FOR MUSCLE SPASMS.  Marland Kitchen dexamethasone (DECADRON) 4 MG tablet Take 2 tablets two times a day for 1 day on day 4 after cisplatin chemotherapy. Take with food.  . lidocaine-prilocaine (EMLA) cream Apply to affected area once  . LORazepam (ATIVAN) 0.5 MG tablet Take 1 tablet (0.5 mg total) by mouth every 6 (six) hours as needed (Nausea or vomiting).  . ondansetron (ZOFRAN) 8 MG tablet Take 1 tablet (8 mg total) by mouth 2 (two) times  daily as needed. Start on the third day after cisplatin chemotherapy.  . polyethylene glycol powder (GLYCOLAX/MIRALAX) powder Take 17 g by mouth daily.  . prochlorperazine (COMPAZINE) 10 MG tablet Take 1 tablet (10 mg total) by mouth every 6 (six) hours as needed (Nausea or vomiting).  . traZODone (DESYREL) 100 MG tablet Take 400 mg by mouth at bedtime.  Marland Kitchen venlafaxine (EFFEXOR) 75 MG tablet Take 150 mg by mouth 2 (two) times daily.   . magnesium oxide (MAG-OX) 400 MG tablet Take 400 mg by mouth daily.   No facility-administered encounter medications on file as of 02/06/2017.     ALLERGIES:  Allergies  Allergen Reactions  . Codeine Nausea Only  . Niaspan [Niacin Er]      PHYSICAL EXAM:  ECOG Performance status: 1 - Symptomatic; remains independent   Vitals:   02/06/17 1334  BP: (!) 141/62  Pulse: 79  Resp: 20  Temp: 97.8 F (36.6 C)   Filed Weights   02/06/17 1334  Weight: 164 lb 8 oz (74.6 kg)    Physical Exam   Constitutional: He is oriented to person, place, and time.  Thin male; no acute distress   HENT:  Head: Normocephalic.  Mouth/Throat: Oropharynx is clear and moist. No oropharyngeal exudate.  Eyes: Pupils are equal, round, and reactive to light. Conjunctivae are normal. No scleral icterus.  Neck: Normal range of motion.  (R) supraclavicular hard fixed mass measuring approx 5-6 cm. Some associated smaller (R) cervical adenopathy in level 3 & 4 areas.   Cardiovascular: Normal rate and regular rhythm.   Pulmonary/Chest: Effort normal. He has wheezes (expiratory wheezes throughout).  Abdominal: Soft. Bowel sounds are normal. There is no tenderness. There is no rebound and no guarding.  Musculoskeletal: Normal range of motion. He exhibits no edema.  Lymphadenopathy:    He has cervical adenopathy.       Right: Supraclavicular adenopathy present.       Left: No supraclavicular adenopathy present.  Neurological: He is alert and oriented to person, place, and time. No cranial nerve deficit. Gait normal.  Skin: Skin is warm and dry. No rash noted.  Psychiatric: Mood, memory, affect and judgment normal.  Nursing note and vitals reviewed.    LABORATORY DATA:  I have reviewed the labs as listed.  CBC    Component Value Date/Time   WBC 7.4 02/03/2017 1114   RBC 4.75 02/03/2017 1114   HGB 14.5 02/03/2017 1114   HCT 41.1 02/03/2017 1114   PLT 219 02/03/2017 1114   MCV 86.5 02/03/2017 1114   MCH 30.5 02/03/2017 1114   MCHC 35.3 02/03/2017 1114   RDW 15.0 02/03/2017 1114   LYMPHSABS 1,638 01/13/2017 1546   MONOABS 315 01/13/2017 1546   EOSABS 126 01/13/2017 1546   BASOSABS 0 01/13/2017 1546   CMP Latest Ref Rng & Units 01/13/2017 08/23/2015 01/30/2015  Glucose 70 - 99 mg/dL 87 97 89  BUN 7 - 25 mg/dL 11 13 13   Creatinine 0.70 - 1.25 mg/dL 0.96 0.99 0.95  Sodium 135 - 146 mmol/L 136 136 136  Potassium 3.5 - 5.3 mmol/L 4.4 4.3 4.2  Chloride 98 - 110 mmol/L 105 106 104  CO2 20 - 31 mmol/L 25  23 22   Calcium 8.6 - 10.3 mg/dL 8.7 8.7 8.9  Total Protein 6.1 - 8.1 g/dL 6.6 6.7 7.2  Total Bilirubin 0.2 - 1.2 mg/dL 0.5 0.4 0.5  Alkaline Phos 40 - 115 U/L 66 61 71  AST 10 - 35 U/L 13  15 17  ALT 9 - 46 U/L 8(L) 9 13    PENDING LABS:    DIAGNOSTIC IMAGING:  *The following radiologic images and reports have been reviewed independently and agree with below findings.  CT neck: 01/16/17     CT chest: 01/16/17    PET scan: 02/02/17      PATHOLOGY:  (R) neck lymph node biopsy: 02/03/17          ASSESSMENT & PLAN:   Extensive-stage small cell lung cancer with liver mets:  -Initially presented to his PCP in early 01/2017 with 3-week history of right neck mass.  CT chest performed on 01/16/17 revealed extensive lymphadenopathy in chest and lower right cervical lymph node area; also indeterminate lesion in liver.  CT neck also performed on 01/16/17 also showed bulky (R) supraclavicular malignant lymph node measuring 5.4 cm, with several abnormal level 3 & 4 cervical lymph nodes and lymphadenopathy to superior mediastinum.  -IR attempted to place port-a-cath for anticipated chemotherapy; they were unable to place port d/t enlarged SVC and aborted procedure d/t concerns for patient safety.   -Biopsy of (R) neck mass revealed small cell carcinoma, most consistent with a lung primary.   -We discussed & reviewed all of his previous diagnostic imaging and procedures thus far.  PET scan does reveal hypermetabolic liver lesion, as well as multiple sites of hypermetabolic lymphadenopathy. This is considered extensive stage lung cancer. This was explained to the patient today in detail. He understands that Accident is extremely aggressive and overall prognosis is generally poor.  For patients with limited stage SCLC, median survival ranges from 15-20 months, with 5-survival rate of 10-13%.  For patients with extensive stage SCLC, median survival is 8-13 months and 5-year survival is 1-2%.  While his  cancer is not curable, it is treatable.    -Discussed with Dr. Talbert Cage. Will plan to start chemotherapy with Cisplatin/Etoposide on Monday, 02/09/17. We will have to give his chemotherapy peripherally d/t inability to place port-a-cath safely. We discussed chemotherapy administration in general terms, including common side effects and scheduling of chemotherapy; he will get more formal chemo teaching with our nurse navigator, Joanne Gavel, RN.   -Discussed that in order to complete staging evaluation, we need to obtain MRI brain; orders placed today.  If brain MRI is positive for metastatic disease, it will not change our initial management with chemotherapy. However, it will change management with radiation oncology.   -Will refer to radiation oncology for consideration of radiation for SVC syndrome and also cranial irradiation (either prophylactic irradiation or whole brain radiation based on MRI brain results).     Dispo:  -MRI brain as soon as able.  -Return to cancer center on Monday, 02/09/17 to start chemotherapy.     All questions were answered to patient's stated satisfaction. Encouraged patient to call with any new concerns or questions before his next visit to the cancer center and we can certain see him sooner, if needed.    Plan of care discussed with Dr. Talbert Cage, who agrees with the above aforementioned.   A total of 50 minutes was spent in face-to-face care of this patient, with greater than 50% of that time spent in counseling and care-coordination.     Orders placed this encounter:  Orders Placed This Encounter  Procedures  . MR Brain [place], NP [place] [phone]

## 2017-02-05 NOTE — Telephone Encounter (Signed)
Called patient to let him know that the path report from his biopsy came back as small cell lung carcinoma. I have changed his appt from Monday 7/30 to tomorrow 7/27 at 2:00pm so that we may go over his PET, path, and plan of care with chemotherapy with him. 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 Also briefly reviewed PET with him and made him aware that he does have metastatic disease to the liver, however told him that we will review the PET in more detail tomorrow in person. Plan to start him on palliative chemotherapy with cisplatin+etoposide on 02/09/17. Patient verbalized understanding and stated he will be at the appointment tomorrow at 2 pm.

## 2017-02-06 ENCOUNTER — Encounter (HOSPITAL_COMMUNITY): Payer: Self-pay | Admitting: Adult Health

## 2017-02-06 ENCOUNTER — Encounter (HOSPITAL_COMMUNITY): Payer: Medicare PPO | Attending: Adult Health | Admitting: Adult Health

## 2017-02-06 DIAGNOSIS — C3490 Malignant neoplasm of unspecified part of unspecified bronchus or lung: Secondary | ICD-10-CM | POA: Diagnosis not present

## 2017-02-06 DIAGNOSIS — C787 Secondary malignant neoplasm of liver and intrahepatic bile duct: Secondary | ICD-10-CM

## 2017-02-06 DIAGNOSIS — C770 Secondary and unspecified malignant neoplasm of lymph nodes of head, face and neck: Secondary | ICD-10-CM

## 2017-02-06 NOTE — Patient Instructions (Signed)
[facility] Discharge Instructions  RECOMMENDATIONS MADE BY THE CONSULTANT AND ANY TEST RESULTS WILL BE SENT TO YOUR REFERRING PHYSICIAN.  You were seen today by Gretchen Dawson NP.   Thank you for choosing [facility] at [facility] to provide your oncology and hematology care.  To afford each patient quality time with our provider, please arrive at least 15 minutes before your scheduled appointment time.    If you have a lab appointment with the Cancer Center please come in thru the  Main Entrance and check in at the main information desk  You need to re-schedule your appointment should you arrive 10 or more minutes late.  We strive to give you quality time with our providers, and arriving late affects you and other patients whose appointments are after yours.  Also, if you no show three or more times for appointments you may be dismissed from the clinic at the providers discretion.     Again, thank you for choosing [facility].  Our hope is that these requests will decrease the amount of time that you wait before being seen by our physicians.       _____________________________________________________________  Should you have questions after your visit to [facility], please contact our office at [phone] between the hours of 8:30 a.m. and 4:30 p.m.  Voicemails left after 4:30 p.m. will not be returned until the following business day.  For prescription refill requests, have your pharmacy contact our office.       Resources For Cancer Patients and their Caregivers ? American Cancer Society: Can assist with transportation, wigs, general needs, runs Look Good Feel Better.        1-888-227-6333 ? Cancer Care: Provides financial assistance, online support groups, medication/co-pay assistance.  1-800-813-HOPE (4673) ? [facility] Assists [place] cancer patients and  their families through emotional , educational and financial support.  336-427-4357 ? [place] DSS Where to apply for food stamps, Medicaid and utility assistance. 336-342-1394 ? RCATS: Transportation to medical appointments. 336-347-2287 ? Social Security Administration: May apply for disability if have a Stage IV cancer. 336-342-7796 1-800-772-1213 ? [place] Aging, Disability and Transit Services: Assists with nutrition, care and transit needs. 336-349-2343  Cancer Center Support Programs: @[email or web address]@ > Cancer Support Group  2nd Tuesday of the month 1pm-2pm, Journey Room  > Creative Journey  3rd Tuesday of the month 1130am-1pm, Journey Room  > Look Good Feel Better  1st Wednesday of the month 10am-12 noon, Journey Room (Call American Cancer Society to register 1-800-395-5775)    

## 2017-02-06 NOTE — Patient Instructions (Addendum)
[place]   CHEMOTHERAPY INSTRUCTIONS  Premeds - Aloxi & Emend - for nausea/vomiting prevention/redution. Dexamethasone - steroid - given to reduce the risk of nausea/vomiting. These meds take 20 minutes to infuse.   Cisplatin - this medication is hard on your kidneys. This is why we give you a large amount of fluids through your IV while you are here getting chemo. We also need you drinking 64 oz of fluid (preferably water/decaff fluids) 2 days prior to chemo and for up to 4-5 days after chemo. Drink more if you can. This will help keep your kidneys flushed. This can also cause acute and/or delayed nausea/vomiting. You must take your nausea meds as prescribed if you get nauseated. Do not wait until you start vomiting. This med can also cause peripheral neuropathy (numbness/tingling/burning in hands/fingers/feet/toes). Let us know if this develops so that we can monitor it and treat if necessary. (Takes 1 hour to infuse)  VP16 (Etoposide) - this medication can lower your blood pressure. We will monitor this during chemo. Bone marrow suppression (lowers white blood cells (fight infection), lowers red blood cells (make up your blood), lower platelets (help blood to clot). Hair loss, loss of appetite. (takes 1 hour to infuse)   Neulasta Onpro- this medication is not chemo but being given because you have had chemo. It is given 27 hours after the completion of chemo. The nurse will apply the onpro to the back of your right or left upper arm. This medication works by boosting your bone marrow's supply of white blood cells. White blood cells are what protect our bodies against infection. The medication is given in the form of a subcutaneous injection. The major side effect of this medication is bone or muscle pain. The drug of choice to relieve or lessen the pain is Aleve or Ibuprofen. If a physician has ever told you not to take Aleve or Ibuprofen - then don't take it. You  should then take Tylenol/acetaminophen. Take either medication as the bottle directs you to.  The level of pain you experience as a result of this injection can range from none, to mild or moderate, or severe. Please let us know if you develop moderate or severe bone pain.     POTENTIAL SIDE EFFECTS OF TREATMENT: Increased Susceptibility to Infection, Vomiting, Constipation, Changes in Character of Skin and Nails (brittleness, dryness,etc.), Bone Marrow Suppression, Abdominal Cramping, Urinary Frequency, Blood in Urine, Complete Hair Loss, Nausea, Diarrhea, Sun Sensitivity and Mouth Sores       MEDICATIONS: You have been given prescriptions for the following medications:   Zofran 8mg  tablet. Take 1 tablet every 8 hours as needed for nausea/vomiting. #1 to take for nausea/vomiting.   Compazine 10mg  tablet. Take 1 tablet every 6 hours as needed for nausea/vomiting. #2 to take for nausea/vomiting    SYMPTOMS TO REPORT AS SOON AS POSSIBLE AFTER TREATMENT:  FEVER GREATER THAN 100.5 F  CHILLS WITH OR WITHOUT FEVER  NAUSEA AND VOMITING THAT IS NOT CONTROLLED WITH YOUR NAUSEA MEDICATION  UNUSUAL SHORTNESS OF BREATH  UNUSUAL BRUISING OR BLEEDING  TENDERNESS IN MOUTH AND THROAT WITH OR WITHOUT PRESENCE OF ULCERS  URINARY PROBLEMS  BOWEL PROBLEMS  UNUSUAL RASH    Wear comfortable clothing and clothing appropriate for easy access to any Portacath or PICC line. Let us know if there is anything that we can do to make your therapy better!      I have been informed and understand all of  the instructions given to me and have received a copy. I have been instructed to call the clinic [phone] or my family physician as soon as possible for continued medical care, if indicated. I do not have any more questions at this time but understand that I may call the [facility] or the Patient Navigator at [phone] during office hours should I have questions or need assistance in  obtaining follow-up care.

## 2017-02-09 ENCOUNTER — Encounter (HOSPITAL_COMMUNITY): Payer: Self-pay

## 2017-02-09 ENCOUNTER — Other Ambulatory Visit (HOSPITAL_COMMUNITY): Payer: Self-pay | Admitting: Oncology

## 2017-02-09 ENCOUNTER — Encounter (HOSPITAL_COMMUNITY): Payer: Medicare PPO

## 2017-02-09 ENCOUNTER — Encounter (HOSPITAL_BASED_OUTPATIENT_CLINIC_OR_DEPARTMENT_OTHER): Payer: Medicare PPO

## 2017-02-09 ENCOUNTER — Ambulatory Visit (HOSPITAL_COMMUNITY): Payer: Medicare PPO

## 2017-02-09 VITALS — BP 158/72 | HR 69 | Temp 98.0°F | Resp 18 | Wt 169.4 lb

## 2017-02-09 DIAGNOSIS — C787 Secondary malignant neoplasm of liver and intrahepatic bile duct: Secondary | ICD-10-CM

## 2017-02-09 DIAGNOSIS — C770 Secondary and unspecified malignant neoplasm of lymph nodes of head, face and neck: Secondary | ICD-10-CM | POA: Diagnosis not present

## 2017-02-09 DIAGNOSIS — Z5111 Encounter for antineoplastic chemotherapy: Secondary | ICD-10-CM | POA: Diagnosis not present

## 2017-02-09 DIAGNOSIS — C3490 Malignant neoplasm of unspecified part of unspecified bronchus or lung: Secondary | ICD-10-CM

## 2017-02-09 LAB — CBC WITH DIFFERENTIAL/PLATELET
Basophils Absolute: 0 10*3/uL (ref 0.0–0.1)
Basophils Relative: 0 %
Eosinophils Absolute: 0.2 10*3/uL (ref 0.0–0.7)
Eosinophils Relative: 2 %
HCT: 40.4 % (ref 39.0–52.0)
Hemoglobin: 13.6 g/dL (ref 13.0–17.0)
Lymphocytes Relative: 17 %
Lymphs Abs: 1.5 10*3/uL (ref 0.7–4.0)
MCH: 30.4 pg (ref 26.0–34.0)
MCHC: 33.7 g/dL (ref 30.0–36.0)
MCV: 90.2 fL (ref 78.0–100.0)
Monocytes Absolute: 0.5 10*3/uL (ref 0.1–1.0)
Monocytes Relative: 6 %
Neutro Abs: 6.5 10*3/uL (ref 1.7–7.7)
Neutrophils Relative %: 75 %
Platelets: 217 10*3/uL (ref 150–400)
RBC: 4.48 MIL/uL (ref 4.22–5.81)
RDW: 15.4 % (ref 11.5–15.5)
WBC: 8.7 10*3/uL (ref 4.0–10.5)

## 2017-02-09 LAB — COMPREHENSIVE METABOLIC PANEL
ALT: 11 U/L — ABNORMAL LOW (ref 17–63)
AST: 19 U/L (ref 15–41)
Albumin: 3.4 g/dL — ABNORMAL LOW (ref 3.5–5.0)
Alkaline Phosphatase: 67 U/L (ref 38–126)
Anion gap: 6 (ref 5–15)
BUN: 13 mg/dL (ref 6–20)
CO2: 23 mmol/L (ref 22–32)
Calcium: 8.7 mg/dL — ABNORMAL LOW (ref 8.9–10.3)
Chloride: 110 mmol/L (ref 101–111)
Creatinine, Ser: 0.91 mg/dL (ref 0.61–1.24)
GFR calc Af Amer: >60 mL/min (ref >60)
GFR calc non Af Amer: >60 mL/min (ref >60)
Glucose, Bld: 102 mg/dL — ABNORMAL HIGH (ref 65–99)
Potassium: 4.2 mmol/L (ref 3.5–5.1)
Sodium: 139 mmol/L (ref 135–145)
Total Bilirubin: 0.5 mg/dL (ref 0.3–1.2)
Total Protein: 6.7 g/dL (ref 6.5–8.1)

## 2017-02-09 LAB — MAGNESIUM: Magnesium: 2.1 mg/dL (ref 1.7–2.4)

## 2017-02-09 MED ORDER — PALONOSETRON HCL INJECTION 0.25 MG/5ML
0.2500 mg | Freq: Once | INTRAVENOUS | Status: AC
Start: 2017-02-09 — End: 2017-02-09
  Administered 2017-02-09: 0.25 mg via INTRAVENOUS

## 2017-02-09 MED ORDER — ONDANSETRON HCL 8 MG PO TABS: 8.0000 mg | ORAL_TABLET | Freq: Two times a day (BID) | ORAL | 1 refills | Status: DC | PRN

## 2017-02-09 MED ORDER — POLYETHYLENE GLYCOL 3350 17 GM/SCOOP PO POWD: 17.0000 g | Freq: Every day | ORAL | 2 refills | Status: DC

## 2017-02-09 MED ORDER — PALONOSETRON HCL INJECTION 0.25 MG/5ML
INTRAVENOUS | Status: AC
  Filled 2017-02-09: qty 5

## 2017-02-09 MED ORDER — FOSAPREPITANT DIMEGLUMINE INJECTION 150 MG
Freq: Once | INTRAVENOUS | Status: AC
  Administered 2017-02-09: 13:00:00 via INTRAVENOUS
  Filled 2017-02-09: qty 5

## 2017-02-09 MED ORDER — SODIUM CHLORIDE 0.9% FLUSH
10.0000 mL | INTRAVENOUS | Status: DC | PRN
  Administered 2017-02-09: 10 mL
  Filled 2017-02-09: qty 10

## 2017-02-09 MED ORDER — SODIUM CHLORIDE 0.9 % IV SOLN
Freq: Once | INTRAVENOUS | Status: AC
  Administered 2017-02-09: 12:00:00 via INTRAVENOUS

## 2017-02-09 MED ORDER — DEXAMETHASONE 4 MG PO TABS: ORAL_TABLET | ORAL | 1 refills | Status: DC

## 2017-02-09 MED ORDER — PROCHLORPERAZINE MALEATE 10 MG PO TABS: 10.0000 mg | ORAL_TABLET | Freq: Four times a day (QID) | ORAL | 1 refills | Status: DC | PRN

## 2017-02-09 MED ORDER — SODIUM CHLORIDE 0.9 % IV SOLN
80.0000 mg/m2 | Freq: Once | INTRAVENOUS | Status: AC
  Administered 2017-02-09: 153 mg via INTRAVENOUS
  Filled 2017-02-09: qty 153

## 2017-02-09 MED ORDER — SODIUM CHLORIDE 0.9 % IV SOLN
100.0000 mg/m2 | Freq: Once | INTRAVENOUS | Status: AC
  Administered 2017-02-09: 190 mg via INTRAVENOUS
  Filled 2017-02-09: qty 9.5

## 2017-02-09 MED ORDER — LIDOCAINE-PRILOCAINE 2.5-2.5 % EX CREA: TOPICAL_CREAM | CUTANEOUS | 3 refills | Status: DC

## 2017-02-09 MED ORDER — MANNITOL 25 % IV SOLN
Freq: Once | INTRAVENOUS | Status: AC
  Administered 2017-02-09: 10:00:00 via INTRAVENOUS
  Filled 2017-02-09: qty 10

## 2017-02-09 NOTE — Progress Notes (Signed)
Chemotherapy teaching completed.  Consent signed.  Extensive teaching packet given.   

## 2017-02-09 NOTE — Addendum Note (Signed)
Addended by: Joanne Gavel T on: 02/09/2017 03:32 PM   Modules accepted: Orders

## 2017-02-09 NOTE — Patient Instructions (Signed)
[facility] Discharge Instructions for Patients Receiving Chemotherapy   Beginning January 23rd 2017 lab work for the [facility] will be done in the  Main lab [facility] 1st floor. If you have a lab appointment with the [facility] please come in thru the  Main Entrance and check in at the main information desk   Today you received the following chemotherapy agents: Cisplatin, Etoposide  To help prevent nausea and vomiting after your treatment, we encourage you to take your nausea medication as prescribed.   If you develop nausea and vomiting, or diarrhea that is not controlled by your medication, call the clinic.  The clinic phone number is [phone]. Office hours are Monday-Friday 8:30am-5:00pm.  BELOW ARE SYMPTOMS THAT SHOULD BE REPORTED IMMEDIATELY:  *FEVER GREATER THAN 101.0 F  *CHILLS WITH OR WITHOUT FEVER  NAUSEA AND VOMITING THAT IS NOT CONTROLLED WITH YOUR NAUSEA MEDICATION  *UNUSUAL SHORTNESS OF BREATH  *UNUSUAL BRUISING OR BLEEDING  TENDERNESS IN MOUTH AND THROAT WITH OR WITHOUT PRESENCE OF ULCERS  *URINARY PROBLEMS  *BOWEL PROBLEMS  UNUSUAL RASH Items with * indicate a potential emergency and should be followed up as soon as possible. If you have an emergency after office hours please contact your primary care physician or go to the nearest emergency department.  Please call the clinic during office hours if you have any questions or concerns.   You may also contact the Patient Navigator at [phone] should you have any questions or need assistance in obtaining follow up care.      Resources For Cancer Patients and their Caregivers ? American Cancer Society: Can assist with transportation, wigs, general needs, runs Look Good Feel Better.        628 835 5120 ? Cancer Care: Provides financial assistance, online support groups, medication/co-pay assistance.  1-800-813-HOPE 573-355-5981) ? [facility] Assists [place] cancer patients and their families through emotional , educational and financial support.  904-121-7293 ? [place] DSS Where to apply for food stamps, Medicaid and utility assistance. (618)416-3048 ? RCATS: Transportation to medical appointments. 801-372-3572 ? Social Security Administration: May apply for disability if have a Stage IV cancer. 564-659-2167 313-755-4386 ? [employer], Disability and Transit Services: Assists with nutrition, care and transit needs. ([phone] 785-0138

## 2017-02-09 NOTE — Progress Notes (Signed)
Patient voided 536ml of clear yellow urine. Okay per Dr. Talbert Cage to proceed with treatment.  Patient tolerated infusion today without incidence.  Patient left with IV intact and left arm wrapped with kling.  Patient instructed to leave it on until tomorrow's treatment.  Patient discharged ambulatory and in stable condition from clinic with brother.  Patient has copy of schedule and knows when to return.

## 2017-02-10 ENCOUNTER — Encounter (HOSPITAL_COMMUNITY): Payer: Self-pay

## 2017-02-10 ENCOUNTER — Encounter (HOSPITAL_BASED_OUTPATIENT_CLINIC_OR_DEPARTMENT_OTHER): Payer: Medicare PPO

## 2017-02-10 ENCOUNTER — Other Ambulatory Visit (HOSPITAL_COMMUNITY): Payer: Self-pay | Admitting: Emergency Medicine

## 2017-02-10 ENCOUNTER — Encounter: Payer: Self-pay | Admitting: *Deleted

## 2017-02-10 VITALS — BP 157/72 | HR 73 | Temp 97.8°F | Resp 18 | Wt 169.2 lb

## 2017-02-10 DIAGNOSIS — C770 Secondary and unspecified malignant neoplasm of lymph nodes of head, face and neck: Secondary | ICD-10-CM | POA: Diagnosis not present

## 2017-02-10 DIAGNOSIS — C787 Secondary malignant neoplasm of liver and intrahepatic bile duct: Secondary | ICD-10-CM

## 2017-02-10 DIAGNOSIS — C3490 Malignant neoplasm of unspecified part of unspecified bronchus or lung: Secondary | ICD-10-CM | POA: Diagnosis not present

## 2017-02-10 DIAGNOSIS — Z5111 Encounter for antineoplastic chemotherapy: Secondary | ICD-10-CM | POA: Diagnosis not present

## 2017-02-10 MED ORDER — SODIUM CHLORIDE 0.9 % IV SOLN
100.0000 mg/m2 | Freq: Once | INTRAVENOUS | Status: AC
  Administered 2017-02-10: 190 mg via INTRAVENOUS
  Filled 2017-02-10: qty 9.5

## 2017-02-10 MED ORDER — SODIUM CHLORIDE 0.9 % IV SOLN
Freq: Once | INTRAVENOUS | Status: AC
  Administered 2017-02-10: 11:00:00 via INTRAVENOUS

## 2017-02-10 MED ORDER — DEXAMETHASONE SODIUM PHOSPHATE 10 MG/ML IJ SOLN
INTRAMUSCULAR | Status: AC
  Filled 2017-02-10: qty 1

## 2017-02-10 MED ORDER — DEXAMETHASONE SODIUM PHOSPHATE 10 MG/ML IJ SOLN
10.0000 mg | Freq: Once | INTRAMUSCULAR | Status: AC
  Administered 2017-02-10: 10 mg via INTRAVENOUS

## 2017-02-10 MED ORDER — SODIUM CHLORIDE 0.9 % IV SOLN: 10.0000 mg | Freq: Once | INTRAVENOUS | Status: DC

## 2017-02-10 MED ORDER — SODIUM CHLORIDE 0.9% FLUSH: 10.0000 mL | INTRAVENOUS | Status: DC | PRN

## 2017-02-10 NOTE — Progress Notes (Signed)
[place] Clinical Social Work  Clinical Social Work was referred by [place] rounding in the infusion area for assessment of psychosocial needs due to new pt. Clinical Social Worker met with patient and his brother to offer support and assess for needs. CSW introduced self, explained role of CSW/Pt and Family Support Team, support groups and other resources to assist. Pt and his brother had questions about possible resources for help at home. CSW explained options most likely covered by insurance and those that would be out of pocket. Pt lives alone, but does have support from family. CSW also reviewed transportation resources and how to get assistance with gas cards. CSW provided lung cancer alliance application, CSW info sheet and Support/Resource handout for additional resources. Pt and brother appreciated assistance and agree to reach out as needed.     Clinical Social Work interventions: Resource education and referral  Loren Racer, LCSW, OSW-C [place] [facility]   [phone]

## 2017-02-10 NOTE — Patient Instructions (Signed)
[facility] Discharge Instructions for Patients Receiving Chemotherapy   Beginning January 23rd 2017 lab work for the [facility] will be done in the  Main lab at [facility] on 1st floor. If you have a lab appointment with the [place] please come in thru the  Main Entrance and check in at the main information desk   Today you received the following chemotherapy agents: Etoposide (VP-16)  To help prevent nausea and vomiting after your treatment, we encourage you to take your nausea medication as prescribed.   If you develop nausea and vomiting, or diarrhea that is not controlled by your medication, call the clinic.  The clinic phone number is [phone]. Office hours are Monday-Friday 8:30am-5:00pm.  BELOW ARE SYMPTOMS THAT SHOULD BE REPORTED IMMEDIATELY:  *FEVER GREATER THAN 101.0 F  *CHILLS WITH OR WITHOUT FEVER  NAUSEA AND VOMITING THAT IS NOT CONTROLLED WITH YOUR NAUSEA MEDICATION  *UNUSUAL SHORTNESS OF BREATH  *UNUSUAL BRUISING OR BLEEDING  TENDERNESS IN MOUTH AND THROAT WITH OR WITHOUT PRESENCE OF ULCERS  *URINARY PROBLEMS  *BOWEL PROBLEMS  UNUSUAL RASH Items with * indicate a potential emergency and should be followed up as soon as possible. If you have an emergency after office hours please contact your primary care physician or go to the nearest emergency department.  Please call the clinic during office hours if you have any questions or concerns.   You may also contact the Patient Navigator at [phone] should you have any questions or need assistance in obtaining follow up care.      Resources For Cancer Patients and their Caregivers ? American Cancer Society: Can assist with transportation, wigs, general needs, runs Look Good Feel Better.        405-007-9146 ? Cancer Care: Provides financial assistance, online support groups, medication/co-pay assistance.  1-800-813-HOPE 216 381 8623) ? [place] Assists [place] cancer patients and their families through emotional , educational and financial support.  386-111-0243 ? [place] DSS Where to apply for food stamps, Medicaid and utility assistance. (310)142-4339 ? RCATS: Transportation to medical appointments. 504-470-2070 ? Social Security Administration: May apply for disability if have a Stage IV cancer. 762-169-8941 3234152509 ? [employer], Disability and Transit Services: Assists with nutrition, care and transit needs. [phone]

## 2017-02-10 NOTE — Progress Notes (Signed)
Patient presents today for chemotherapy per MD orders. Patient states that he had a good evening yesterday. He was a little tired but mainly because he sat all day yesterday. Patient states he has eaten breakfast this morning and so far feels "great".  Patient tolerated infusion today without incidence. Patient discharged ambulatory and in stable condition from clinic with brother. Patient to follow up tomorrow at scheduled time.

## 2017-02-11 ENCOUNTER — Encounter (HOSPITAL_COMMUNITY): Payer: Self-pay

## 2017-02-11 ENCOUNTER — Ambulatory Visit (HOSPITAL_COMMUNITY)
Admission: RE | Admit: 2017-02-11 | Discharge: 2017-02-11 | Disposition: A | Discharge status: Home / Self Care | Payer: Medicare PPO | Source: Ambulatory Visit | Attending: Adult Health | Admitting: Adult Health

## 2017-02-11 ENCOUNTER — Encounter (HOSPITAL_COMMUNITY): Payer: Medicare PPO | Attending: Oncology

## 2017-02-11 VITALS — BP 134/63 | HR 70 | Temp 97.6°F | Resp 18

## 2017-02-11 DIAGNOSIS — C787 Secondary malignant neoplasm of liver and intrahepatic bile duct: Secondary | ICD-10-CM

## 2017-02-11 DIAGNOSIS — C3490 Malignant neoplasm of unspecified part of unspecified bronchus or lung: Secondary | ICD-10-CM | POA: Insufficient documentation

## 2017-02-11 DIAGNOSIS — Z5111 Encounter for antineoplastic chemotherapy: Secondary | ICD-10-CM

## 2017-02-11 DIAGNOSIS — C770 Secondary and unspecified malignant neoplasm of lymph nodes of head, face and neck: Secondary | ICD-10-CM

## 2017-02-11 MED ORDER — SODIUM CHLORIDE 0.9 % IV SOLN
Freq: Once | INTRAVENOUS | Status: AC
  Administered 2017-02-11: 12:00:00 via INTRAVENOUS

## 2017-02-11 MED ORDER — SODIUM CHLORIDE 0.9 % IV SOLN: 10.0000 mg | Freq: Once | INTRAVENOUS | Status: DC

## 2017-02-11 MED ORDER — DEXAMETHASONE SODIUM PHOSPHATE 10 MG/ML IJ SOLN
10.0000 mg | Freq: Once | INTRAMUSCULAR | Status: AC
  Administered 2017-02-11: 10 mg via INTRAVENOUS

## 2017-02-11 MED ORDER — DEXAMETHASONE SODIUM PHOSPHATE 10 MG/ML IJ SOLN
INTRAMUSCULAR | Status: AC
  Filled 2017-02-11: qty 1

## 2017-02-11 MED ORDER — SODIUM CHLORIDE 0.9% FLUSH
10.0000 mL | INTRAVENOUS | Status: DC | PRN
  Administered 2017-02-11: 10 mL
  Filled 2017-02-11: qty 10

## 2017-02-11 MED ORDER — SODIUM CHLORIDE 0.9 % IV SOLN
100.0000 mg/m2 | Freq: Once | INTRAVENOUS | Status: AC
  Administered 2017-02-11: 190 mg via INTRAVENOUS
  Filled 2017-02-11: qty 9.5

## 2017-02-11 MED ORDER — GADOBENATE DIMEGLUMINE 529 MG/ML IV SOLN
15.0000 mL | Freq: Once | INTRAVENOUS | Status: AC | PRN
  Administered 2017-02-11: 15 mL via INTRAVENOUS

## 2017-02-11 NOTE — Progress Notes (Signed)
Patient presents today for chemotherapy infusion per MD orders. Patient has IV still intact.  Patient tolerated infusion without incidence. Patient discharged ambulatory and in stable condition from clinic. Follow up tomorrow for injection as scheduled. Patient is aware of time.

## 2017-02-11 NOTE — Patient Instructions (Signed)
[facility] Discharge Instructions for Patients Receiving Chemotherapy   Beginning January 23rd 2017 lab work for the [facility] will be done in the  Main lab at [facility] on 1st floor. If you have a lab appointment with the [place] please come in thru the  Main Entrance and check in at the main information desk   Today you received the following chemotherapy agents: Etoposide (VP-16)  To help prevent nausea and vomiting after your treatment, we encourage you to take your nausea medication as prescribed.    If you develop nausea and vomiting, or diarrhea that is not controlled by your medication, call the clinic.  The clinic phone number is [phone]. Office hours are Monday-Friday 8:30am-5:00pm.  BELOW ARE SYMPTOMS THAT SHOULD BE REPORTED IMMEDIATELY:  *FEVER GREATER THAN 101.0 F  *CHILLS WITH OR WITHOUT FEVER  NAUSEA AND VOMITING THAT IS NOT CONTROLLED WITH YOUR NAUSEA MEDICATION  *UNUSUAL SHORTNESS OF BREATH  *UNUSUAL BRUISING OR BLEEDING  TENDERNESS IN MOUTH AND THROAT WITH OR WITHOUT PRESENCE OF ULCERS  *URINARY PROBLEMS  *BOWEL PROBLEMS  UNUSUAL RASH Items with * indicate a potential emergency and should be followed up as soon as possible. If you have an emergency after office hours please contact your primary care physician or go to the nearest emergency department.  Please call the clinic during office hours if you have any questions or concerns.   You may also contact the Patient Navigator at [phone] should you have any questions or need assistance in obtaining follow up care.      Resources For Cancer Patients and their Caregivers ? American Cancer Society: Can assist with transportation, wigs, general needs, runs Look Good Feel Better.        762-435-7869 ? Cancer Care: Provides financial assistance, online support groups, medication/co-pay assistance.  1-800-813-HOPE (309)146-4510) ? [place] Assists [place] cancer patients and their families through emotional , educational and financial support.  718-294-5881 ? [place] DSS Where to apply for food stamps, Medicaid and utility assistance. 442-760-7241 ? RCATS: Transportation to medical appointments. (819)274-2460 ? Social Security Administration: May apply for disability if have a Stage IV cancer. [phone] 772-355-2896 ? [employer], Disability and Transit Services: Assists with nutrition, care and transit needs. [phone]

## 2017-02-12 ENCOUNTER — Encounter (HOSPITAL_BASED_OUTPATIENT_CLINIC_OR_DEPARTMENT_OTHER): Payer: Medicare PPO

## 2017-02-12 ENCOUNTER — Encounter (HOSPITAL_COMMUNITY): Payer: Self-pay

## 2017-02-12 VITALS — BP 132/67 | HR 74 | Temp 97.8°F | Resp 18

## 2017-02-12 DIAGNOSIS — Z5189 Encounter for other specified aftercare: Secondary | ICD-10-CM

## 2017-02-12 DIAGNOSIS — C787 Secondary malignant neoplasm of liver and intrahepatic bile duct: Secondary | ICD-10-CM | POA: Diagnosis not present

## 2017-02-12 DIAGNOSIS — C3490 Malignant neoplasm of unspecified part of unspecified bronchus or lung: Secondary | ICD-10-CM | POA: Diagnosis not present

## 2017-02-12 DIAGNOSIS — C770 Secondary and unspecified malignant neoplasm of lymph nodes of head, face and neck: Secondary | ICD-10-CM

## 2017-02-12 MED ORDER — PEGFILGRASTIM INJECTION 6 MG/0.6ML ~~LOC~~
6.0000 mg | PREFILLED_SYRINGE | Freq: Once | SUBCUTANEOUS | Status: AC
  Administered 2017-02-12: 6 mg via SUBCUTANEOUS

## 2017-02-12 MED ORDER — PEGFILGRASTIM INJECTION 6 MG/0.6ML ~~LOC~~
PREFILLED_SYRINGE | SUBCUTANEOUS | Status: AC
  Filled 2017-02-12: qty 0.6

## 2017-02-12 NOTE — Progress Notes (Signed)
24 hr Chemo F/U Pt doing well at this time and denies any problems. Pt instructed to call for any issues or questions and understanding was verbalized

## 2017-02-12 NOTE — Patient Instructions (Signed)
[place] at [facility] Discharge Instructions  RECOMMENDATIONS MADE BY THE CONSULTANT AND ANY TEST RESULTS WILL BE SENT TO YOUR REFERRING PHYSICIAN.  Received Neulasta injection today. Follow-up as scheduled. Call clinic for any questions or concerns  Thank you for choosing [place] at [facility] to provide your oncology and hematology care.  To afford each patient quality time with our provider, please arrive at least 15 minutes before your scheduled appointment time.    If you have a lab appointment with the [place] please come in thru the  Main Entrance and check in at the main information desk  You need to re-schedule your appointment should you arrive 10 or more minutes late.  We strive to give you quality time with our providers, and arriving late affects you and other patients whose appointments are after yours.  Also, if you no show three or more times for appointments you may be dismissed from the clinic at the providers discretion.     Again, thank you for choosing [facility].  Our hope is that these requests will decrease the amount of time that you wait before being seen by our physicians.       _____________________________________________________________  Should you have questions after your visit to [facility], please contact our office at [phone] between the hours of 8:30 a.m. and 4:30 p.m.  Voicemails left after 4:30 p.m. will not be returned until the following business day.  For prescription refill requests, have your pharmacy contact our office.       Resources For Cancer Patients and their Caregivers ? American Cancer Society: Can assist with transportation, wigs, general needs, runs Look Good Feel Better.        757-450-9445 ? Cancer Care: Provides financial assistance, online support groups, medication/co-pay assistance.  1-800-813-HOPE 332-558-2870) ? [place] Assists [place] cancer patients and their families through emotional , educational and financial support.  ([phone] ? [place] DSS Where to apply for food stamps, Medicaid and utility assistance. 940-543-3356 ? RCATS: Transportation to medical appointments. 925-882-4854 ? Social Security Administration: May apply for disability if have a Stage IV cancer. 567-623-4644 612-332-7349 ? [employer], Disability and Transit Services: Assists with nutrition, care and transit needs. [place] Support Programs: @10RELATIVEDAYS @ > Cancer Support Group  2nd Tuesday of the month 1pm-2pm, Journey Room  > Creative Journey  3rd Tuesday of the month 1130am-1pm, Journey Room  > Look Good Feel Better  1st Wednesday of the month 10am-12 noon, Journey Room (Call [place] to register [phone])

## 2017-02-12 NOTE — Progress Notes (Signed)
Harvie Junior tolerated Neulasta injection well without complaints or incident. Pt instructed in purpose and side effects of Neulasta and understanding was verbalized. Pt also given MRI results from yesterday and verbalized understanding.VSS Pt discharged self ambulatory in satisfactory condition

## 2017-02-19 ENCOUNTER — Encounter (HOSPITAL_COMMUNITY): Payer: Self-pay | Admitting: Adult Health

## 2017-02-19 ENCOUNTER — Encounter (HOSPITAL_COMMUNITY): Payer: Medicare PPO

## 2017-02-19 ENCOUNTER — Encounter (HOSPITAL_BASED_OUTPATIENT_CLINIC_OR_DEPARTMENT_OTHER): Payer: Medicare PPO | Admitting: Adult Health

## 2017-02-19 VITALS — BP 136/57 | HR 84 | Temp 97.6°F | Resp 18 | Wt 160.6 lb

## 2017-02-19 DIAGNOSIS — G893 Neoplasm related pain (acute) (chronic): Secondary | ICD-10-CM | POA: Diagnosis not present

## 2017-02-19 DIAGNOSIS — C770 Secondary and unspecified malignant neoplasm of lymph nodes of head, face and neck: Secondary | ICD-10-CM | POA: Diagnosis not present

## 2017-02-19 DIAGNOSIS — C3490 Malignant neoplasm of unspecified part of unspecified bronchus or lung: Secondary | ICD-10-CM

## 2017-02-19 DIAGNOSIS — C787 Secondary malignant neoplasm of liver and intrahepatic bile duct: Secondary | ICD-10-CM

## 2017-02-19 LAB — COMPREHENSIVE METABOLIC PANEL
ALT: 23 U/L (ref 17–63)
AST: 17 U/L (ref 15–41)
Albumin: 3.8 g/dL (ref 3.5–5.0)
Alkaline Phosphatase: 64 U/L (ref 38–126)
Anion gap: 6 (ref 5–15)
BUN: 21 mg/dL — ABNORMAL HIGH (ref 6–20)
CO2: 26 mmol/L (ref 22–32)
Calcium: 9 mg/dL (ref 8.9–10.3)
Chloride: 101 mmol/L (ref 101–111)
Creatinine, Ser: 0.93 mg/dL (ref 0.61–1.24)
GFR calc Af Amer: >60 mL/min (ref >60)
GFR calc non Af Amer: >60 mL/min (ref >60)
Glucose, Bld: 97 mg/dL (ref 65–99)
Potassium: 3.7 mmol/L (ref 3.5–5.1)
Sodium: 133 mmol/L — ABNORMAL LOW (ref 135–145)
Total Bilirubin: 0.6 mg/dL (ref 0.3–1.2)
Total Protein: 6.7 g/dL (ref 6.5–8.1)

## 2017-02-19 LAB — CBC WITH DIFFERENTIAL/PLATELET
Basophils Absolute: 0 10*3/uL (ref 0.0–0.1)
Basophils Relative: 0 %
Eosinophils Absolute: 0.1 10*3/uL (ref 0.0–0.7)
Eosinophils Relative: 1 %
HCT: 37.3 % — ABNORMAL LOW (ref 39.0–52.0)
Hemoglobin: 12.8 g/dL — ABNORMAL LOW (ref 13.0–17.0)
Lymphocytes Relative: 40 %
Lymphs Abs: 2.3 10*3/uL (ref 0.7–4.0)
MCH: 30.6 pg (ref 26.0–34.0)
MCHC: 34.3 g/dL (ref 30.0–36.0)
MCV: 89.2 fL (ref 78.0–100.0)
Monocytes Absolute: 0.9 10*3/uL (ref 0.1–1.0)
Monocytes Relative: 15 %
Neutro Abs: 2.5 10*3/uL (ref 1.7–7.7)
Neutrophils Relative %: 44 %
Platelets: 67 10*3/uL — ABNORMAL LOW (ref 150–400)
RBC: 4.18 MIL/uL — ABNORMAL LOW (ref 4.22–5.81)
RDW: 14.8 % (ref 11.5–15.5)
WBC: 5.8 10*3/uL (ref 4.0–10.5)

## 2017-02-19 LAB — PHOSPHORUS: Phosphorus: 3.6 mg/dL (ref 2.5–4.6)

## 2017-02-19 LAB — URIC ACID: Uric Acid, Serum: 4.6 mg/dL (ref 4.4–7.6)

## 2017-02-19 LAB — MAGNESIUM: Magnesium: 1.9 mg/dL (ref 1.7–2.4)

## 2017-02-19 MED ORDER — HYDROCODONE-ACETAMINOPHEN 5-325 MG PO TABS: 0.5000 | ORAL_TABLET | Freq: Four times a day (QID) | ORAL | 0 refills | Status: DC | PRN

## 2017-02-19 NOTE — Patient Instructions (Addendum)
[place] at [facility] Discharge Instructions  RECOMMENDATIONS MADE BY THE CONSULTANT AND ANY TEST RESULTS WILL BE SENT TO YOUR REFERRING PHYSICIAN.  You saw [name], NP, today Next chemo in 2 weeks and FU on day 1 of next cycle. Refer to [facility] for prophylactic cranial radiation.  See Amy at checkout for appointments.   Thank you for choosing [place] at [facility] to provide your oncology and hematology care.  To afford each patient quality time with our provider, please arrive at least 15 minutes before your scheduled appointment time.    If you have a lab appointment with the [place] please come in thru the  Main Entrance and check in at the main information desk  You need to re-schedule your appointment should you arrive 10 or more minutes late.  We strive to give you quality time with our providers, and arriving late affects you and other patients whose appointments are after yours.  Also, if you no show three or more times for appointments you may be dismissed from the clinic at the providers discretion.     Again, thank you for choosing [facility].  Our hope is that these requests will decrease the amount of time that you wait before being seen by our physicians.       _____________________________________________________________  Should you have questions after your visit to [facility], please contact our office at [phone] between the hours of 8:30 a.m. and 4:30 p.m.  Voicemails left after 4:30 p.m. will not be returned until the following business day.  For prescription refill requests, have your pharmacy contact our office.       Resources For Cancer Patients and their Caregivers ? American Cancer Society: Can assist with transportation, wigs, general needs, runs Look Good Feel Better.        9843420729 ? Cancer Care: Provides financial assistance, online support groups,  medication/co-pay assistance.  1-800-813-HOPE 606-079-0072) ? [place] Assists [place] cancer patients and their families through emotional , educational and financial support.  (769)343-1510 ? [place] DSS Where to apply for food stamps, Medicaid and utility assistance. (717)794-4383 ? RCATS: Transportation to medical appointments. 724-632-3080 ? Social Security Administration: May apply for disability if have a Stage IV cancer. 4784992877 706-334-4968 ? [employer], Disability and Transit Services: Assists with nutrition, care and transit needs. [place] Support Programs: @10RELATIVEDAYS @ > Cancer Support Group  2nd Tuesday of the month 1pm-2pm, Journey Room  > Creative Journey  3rd Tuesday of the month 1130am-1pm, Journey Room  > Look Good Feel Better  1st Wednesday of the month 10am-12 noon, Journey Room (Call [place] to register [phone])

## 2017-02-19 NOTE — Progress Notes (Signed)
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 Bulky 5.4 cm right supraclavicular region malignant lymph node conglomeration with extracapsular extension. 2. Surrounding smaller abnormal right level 3 and level 5 lymph nodes, and the lymphadenopathy continues into the superior mediastinum, see Chest CT findings reported separately. 3. No other metastatic disease identified in the neck.      01/16/2017 Imaging    CT chest: IMPRESSION: 1. Extensive lymphadenopathy in the thorax and lower right cervical region, as discussed above. Primary differential considerations include lymphoma/leukemia or small cell carcinoma of the lung. Further evaluation a PET-CT could be considered to assess for additional sites of disease below the diaphragm if clinically appropriate. Additionally, ultrasound-guided biopsy of supraclavicular lymphadenopathy could be considered to establish a tissue diagnosis. 2. Indeterminate lesion in the periphery of segment 8 of the liver measuring 2.7 x 1.7 cm. Attention at time of follow-up PET-CT is recommended. 3. Aortic atherosclerosis, in addition to left main and 3 vessel coronary artery disease. Please note that although the presence of coronary artery calcium documents the presence of coronary artery disease, the severity of this disease and any potential stenosis cannot be assessed on this non-gated CT examination. Assessment for potential risk factor modification, dietary therapy or pharmacologic therapy may be  warranted, if clinically indicated. 4. There are calcifications of the aortic valve. Echocardiographic correlation for evaluation of potential valvular dysfunction may be warranted if clinically indicated. 5. Diffuse bronchial wall thickening with moderate centrilobular and paraseptal emphysema; imaging findings suggestive of underlying COPD.      02/03/2017 Initial Biopsy    (R) neck lymph node biopsy: SMALL CELL CARCINOMA (most likely lung primary).       02/03/2017 Miscellaneous    Port-a-cath attempted by IR; unable to place d/t enlarged SVC.       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 ([name])     02/09/2017 -  Chemotherapy    The patient had palonosetron (ALOXI) injection 0.25 mg, 0.25 mg, Intravenous,  Once, 1 of 4 cycles  pegfilgrastim (NEULASTA) injection 6 mg, 6 mg, Subcutaneous, Once, 1 of 1 cycle  pegfilgrastim (NEULASTA ONPRO KIT) injection 6 mg, 6 mg, Subcutaneous, Once, 0 of 3 cycles  CISplatin (PLATINOL) 153 mg in sodium chloride 0.9 % 500 mL chemo infusion, 80 mg/m2 = 153 mg, Intravenous,  Once, 1 of 4 cycles  etoposide (VEPESID) 190 mg in sodium chloride 0.9 % 500 mL chemo infusion, 100 mg/m2 = 190 mg, Intravenous,  Once, 1 of 4 cycles  fosaprepitant (EMEND) 150 mg, dexamethasone (DECADRON) 12 mg in sodium chloride 0.9 % 145 mL IVPB, , Intravenous,  Once, 1 of 4 cycles  for chemotherapy treatment.        02/11/2017 Imaging    MRI brain: CLINICAL DATA:  Advanced stage small cell lung cancer. Staging for metastatic disease  EXAM: MRI HEAD WITHOUT AND WITH CONTRAST  TECHNIQUE: Multiplanar, multiecho pulse sequences of the brain and surrounding structures were obtained without and with intravenous contrast.  CONTRAST:  63m MULTIHANCE GADOBENATE DIMEGLUMINE 529 MG/ML IV SOLN  COMPARISON:  None.  FINDINGS: Brain: Negative for hydrocephalus. Cerebral volume normal for age. Small nonenhancing white matter  hyperintensities consistent with mild chronic microvascular ischemia. No acute infarct. Negative for hemorrhage or mass or edema  Normal enhancement postcontrast infusion. No enhancing mass lesion. Leptomeningeal enhancement is normal.  Vascular: Normal arterial flow voids.  Normal venous enhancement  Skull and upper cervical spine: Negative  Sinuses/Orbits: Negative  Other: None  IMPRESSION: Negative for metastatic disease.  No acute abnormality.  Mild chronic white matter changes.         INTERVAL HISTORY:  [name] returns to cancer center for follow-up for extensive stage small cell lung cancer.   He completed his first cycle of chemo 02/09/17-02/11/17. Overall, he feels like he has tolerated chemo pretty well.  Endorses occasional nausea, which is well-managed with PRN anti-emetics. Reports constipation; reports that he did have BM today, but it had been 4-5 days since his last bowel movement.  He is currently taking Miralax daily with OTC stool softener.    Reports lower back and neck pain, which is sharp and "hurts every time my heart beats."  Endorses occasional "throbbing headaches" as well.  Does have h/o low back pain with surgeries in the past, states "I really don't like to take any pain medicines, but I think I might need something for pain that's not too strong."  He is not sleeping well as a result of the pain.    Continues to eat well and energy levels have remained "pretty good" since chemo. He is drinking plenty of fluids and tolerating a variety of foods. Reports appetite and energy levels are both 75%.  He wants me to take a look at "the lump on my neck", as he thinks it is smaller than before chemo.      REVIEW OF SYSTEMS:  Review of Systems  Constitutional: Positive for fatigue. Negative for chills and fever.  HENT:  Negative.   Eyes: Negative.   Respiratory: Negative.  Negative for cough and shortness of breath.   Cardiovascular:  Negative.  Negative for leg swelling.  Gastrointestinal: Positive for constipation and nausea. Negative for abdominal pain, blood in stool, diarrhea and vomiting.  Endocrine: Negative.   Musculoskeletal: Positive for arthralgias, back pain and neck pain.  Skin: Positive for rash (chronic groin rash (he has cream that is helpful; not prescribed by cancer center)).  Neurological: Positive for headaches. Negative for dizziness.  Hematological: Positive for adenopathy.  Psychiatric/Behavioral: Positive for sleep disturbance.     PAST MEDICAL/SURGICAL HISTORY:  Past Medical History:  Diagnosis Date  . Anxiety   . CAD (coronary artery disease)   . COPD (chronic obstructive pulmonary disease) ([facility]   . Depression   . Myocardial infarction ([facility]   . Osteopenia   . Panic attacks   . Smoker    Past Surgical History:  Procedure Laterality Date  . BACK SURGERY  12/24/2000   L5,S1  . CORONARY STENT PLACEMENT  2005   RCA & CX  . HERNIA REPAIR Right 1980's  . INGUINAL HERNIA REPAIR  12/1978   right side  . IR [place] BX ASP/DRAIN  02/03/2017  . NM MYOCAR PERF WALL MOTION  09/07/2009   No ischemia; EF 51%  . SHOULDER SURGERY Left 08/2010  . SPINE SURGERY  2002   L5-S1     SOCIAL HISTORY:  Social History   Social History  .  Marital status: Single    Spouse name: N/A  . Number of children: N/A  . Years of education: N/A   Occupational History  . Not on file.   Social History Main Topics  . Smoking status: Current Every Day Smoker    Packs/day: 1.00    Types: Cigarettes  . Smokeless tobacco: Never Used  . Alcohol use No  . Drug use: No  . Sexual activity: Not on file   Other Topics Concern  . Not on file   Social History Narrative  . No narrative on file    FAMILY HISTORY:  Family History  Problem Relation Age of Onset  . Heart attack Father   . Kidney disease Father        renal failure  . Heart failure Mother   . Heart attack Mother   . Cancer Brother   .  Diabetes Brother   . Alcohol abuse Brother   . Diabetes Sister     CURRENT MEDICATIONS:  Outpatient Encounter Prescriptions as of 02/19/2017  Medication Sig  . alendronate (FOSAMAX) 70 MG tablet Take 1 tablet (70 mg total) by mouth every 7 (seven) days. Take with a full glass of water on an empty stomach.  . ALPRAZolam (XANAX) 1 MG tablet Take 1 mg by mouth 4 (four) times daily.  Marland Kitchen aspirin EC 81 MG tablet Take 81 mg by mouth at bedtime.  Marland Kitchen atorvastatin (LIPITOR) 10 MG tablet TAKE 1 TABLET AT BEDTIME  (DISCONTINUE ATORVASTATIN 20MG)  . CISPLATIN IV Inject into the vein. Day 1 every 21 days  . clotrimazole-betamethasone (LOTRISONE) cream Apply 1 application topically 2 (two) times daily.  Marland Kitchen co-enzyme Q-10 30 MG capsule Take 1 capsule (30 mg total) by mouth daily.  . cyclobenzaprine (FLEXERIL) 10 MG tablet TAKE 1 TABLET THREE TIMES DAILY AS NEEDED FOR MUSCLE SPASMS.  Marland Kitchen dexamethasone (DECADRON) 4 MG tablet Take 2 tablets two times a day for 1 day on day 4 after cisplatin chemotherapy. Take with food.  . ETOPOSIDE IV Inject into the vein. Days 1,2,3 every 21 days  . lidocaine-prilocaine (EMLA) cream Apply to affected area once  . LORazepam (ATIVAN) 0.5 MG tablet Take 1 tablet (0.5 mg total) by mouth every 6 (six) hours as needed (Nausea or vomiting).  . magnesium oxide (MAG-OX) 400 MG tablet Take 400 mg by mouth daily.  . ondansetron (ZOFRAN) 8 MG tablet Take 1 tablet (8 mg total) by mouth 2 (two) times daily as needed. Start on the third day after cisplatin chemotherapy.  . Pegfilgrastim (NEULASTA ONPRO [place]) Inject into the skin. Every 21 days  . polyethylene glycol powder (GLYCOLAX/MIRALAX) powder Take 17 g by mouth daily.  . prochlorperazine (COMPAZINE) 10 MG tablet Take 1 tablet (10 mg total) by mouth every 6 (six) hours as needed (Nausea or vomiting).  . traZODone (DESYREL) 100 MG tablet Take 400 mg by mouth at bedtime.  Marland Kitchen venlafaxine (EFFEXOR) 75 MG tablet Take 150 mg by mouth 2 (two) times  daily.   Marland Kitchen HYDROcodone-acetaminophen (NORCO) 5-325 MG tablet Take 0.5-1 tablets by mouth every 6 (six) hours as needed for moderate pain.   No facility-administered encounter medications on file as of 02/19/2017.     ALLERGIES:  Allergies  Allergen Reactions  . Codeine Nausea Only  . Niaspan [Niacin Er]      PHYSICAL EXAM:  ECOG Performance status: 1 - Symptomatic; remains independent   Vitals:   02/19/17 0917  BP: (!) 136/57  Pulse: 84  Resp: 18  Temp: 97.6 F (36.4 C)  SpO2: 94%   Filed Weights   02/19/17 0917  Weight: 160 lb 9.6 oz (72.8 kg)    Physical Exam  Constitutional: He is oriented to person, place, and time.  Thin, pale male in no acute distress   HENT:  Head: Normocephalic.  Mouth/Throat: Oropharynx is clear and moist. No oropharyngeal exudate.  Eyes: Pupils are equal, round, and reactive to light. Conjunctivae are normal. No scleral icterus.  Neck: Normal range of motion.  (R) supraclavicular with clinical improvement in size from previous. Now measuring about 4-5 cm (previously 5-6+ cm).    Cardiovascular: Normal rate and regular rhythm.   Pulmonary/Chest: Effort normal and breath sounds normal.  Mild rhonchi to bilat upper lobes   Abdominal: Soft. Bowel sounds are normal. There is no tenderness. There is no rebound.  Musculoskeletal: Normal range of motion. He exhibits no edema.  Neurological: He is alert and oriented to person, place, and time. No cranial nerve deficit. Gait normal.  Skin: Skin is warm. There is pallor.  Psychiatric: Mood, memory, affect and judgment normal.  Nursing note and vitals reviewed.    LABORATORY DATA:  I have reviewed the labs as listed.  CBC    Component Value Date/Time   WBC 5.8 02/19/2017 0847   RBC 4.18 (L) 02/19/2017 0847   HGB 12.8 (L) 02/19/2017 0847   HCT 37.3 (L) 02/19/2017 0847   PLT 67 (L) 02/19/2017 0847   MCV 89.2 02/19/2017 0847   MCH 30.6 02/19/2017 0847   MCHC 34.3 02/19/2017 0847   RDW 14.8  02/19/2017 0847   LYMPHSABS 2.3 02/19/2017 0847   MONOABS 0.9 02/19/2017 0847   EOSABS 0.1 02/19/2017 0847   BASOSABS 0.0 02/19/2017 0847   CMP Latest Ref Rng & Units 02/19/2017 02/09/2017 01/13/2017  Glucose 65 - 99 mg/dL 97 102(H) 87  BUN 6 - 20 mg/dL 21(H) 13 11  Creatinine 0.61 - 1.24 mg/dL 0.93 0.91 0.96  Sodium 135 - 145 mmol/L 133(L) 139 136  Potassium 3.5 - 5.1 mmol/L 3.7 4.2 4.4  Chloride 101 - 111 mmol/L 101 110 105  CO2 22 - 32 mmol/L _0 Calcium 8.9 - 10.3 mg/dL 9.0 8.7(L) 8.7  Total Protein 6.5 - 8.1 g/dL 6.7 6.7 6.6  Total Bilirubin 0.3 - 1.2 mg/dL 0.6 0.5 0.5  Alkaline Phos 38 - 126 U/L 64 67 66  AST 15 - 41 U/L _1 ALT 17 - 63 U/L 23 11(L) 8(L)    PENDING LABS:    DIAGNOSTIC IMAGING:  *The following radiologic images and reports have been reviewed independently and agree with below findings.  CT neck: 01/16/17     CT chest: 01/16/17    PET scan: 02/02/17    MRI brain: 02/11/17 CLINICAL DATA:  Advanced stage small cell lung cancer. Staging for metastatic disease  EXAM: MRI HEAD WITHOUT AND WITH CONTRAST  TECHNIQUE: Multiplanar, multiecho pulse sequences of the brain and surrounding structures were obtained without and with intravenous contrast.  CONTRAST:  16m MULTIHANCE GADOBENATE DIMEGLUMINE 529 MG/ML IV SOLN  COMPARISON:  None.  FINDINGS: Brain: Negative for hydrocephalus. Cerebral volume normal for age. Small nonenhancing white matter hyperintensities consistent with mild chronic microvascular ischemia. No acute infarct. Negative for hemorrhage or mass or edema  Normal enhancement postcontrast infusion. No enhancing mass lesion. Leptomeningeal enhancement is normal.  Vascular: Normal arterial flow voids.  Normal venous enhancement  Skull and upper cervical spine: Negative  Sinuses/Orbits: Negative  Other:  None  IMPRESSION: Negative for metastatic disease.  No acute abnormality.  Mild chronic white matter  changes.   Electronically Signed   By: Franchot Gallo M.D.   On: 02/11/2017 15:35    PATHOLOGY:  (R) neck lymph node biopsy: 02/03/17          ASSESSMENT & PLAN:   Extensive-stage small cell lung cancer with liver mets:  -Initially presented to his PCP in early 01/2017 with 3-week history of right neck mass.  CT chest performed on 01/16/17 revealed extensive lymphadenopathy in chest and lower right cervical lymph node area; also indeterminate lesion in liver.  CT neck also performed on 01/16/17 also showed bulky (R) supraclavicular malignant lymph node measuring 5.4 cm, with several abnormal level 3 & 4 cervical lymph nodes and lymphadenopathy to superior mediastinum. IR attempted to place port-a-cath for anticipated chemotherapy; they were unable to place port d/t enlarged SVC and aborted procedure d/t concerns for patient safety.  Biopsy of (R) neck mass revealed small cell carcinoma, most consistent with a lung primary.   PET scan does reveal hypermetabolic liver lesion, as well as multiple sites of hypermetabolic lymphadenopathy. He understands that his cancer is treatable, but not curable.   -s/p cycle #1 Cisplatin/Etoposide and tolerated very well.  There is already measurable clinical response to (R) supraclavicular neck mass, which is encouraging.   -Labs reviewed and look great. Mild anemia and thrombocytopenia, which are likely treatment effect. Electrolytes, kidney, & liver function are largely normal.  Discussed risk of tumor lysis syndrome with initiation of chemotherapy. His potassium is normal today, which is reassuring.  Will add-on phosphorus and uric acid today as well to monitor.   -MRI brain negative for metastatic disease. Discussed with patient referral to radiation oncology for consideration of prophylactic cranial irradiation at the completion of chemotherapy.  Referral placed today so patient can establish care with rad onc team for initial consultation.  -Return to  cancer center in 2 weeks for follow-up and consideration for cycle #2 chemotherapy. Will consider re-consulting IR after next cycle of chemo for port-a-cath placement if he continues to have great clinical response.    Arthralgias to back and neck:  -Likely multifactorial. There is evidence that chemotherapy can worsen previous existing arthritis pain. He also has a large neck mass, which may be contributing as well.  Reports concerns about "feeling out of it with strong pain medications."  -[place] Controlled Substance Reporting System reviewed.  No opiate prescriptions in the past 6 months. Paper prescription for Norco 5/325 given to patient with instructions to take 0.5-1 tab Q6Hprn, #60, no refills for pain.  He can take 1/2 tab for moderate pain and 1 tab for severe pain.      Constipation:  -Encouraged him to try taking Miralax with stool softeners BID to help with constipation.  He understands that opiates can worsen constipation and it is important for him to have regular bowel movements while taking pain medications.    Tobacco use disorder:  -Patient's brother asked to speak to me alone in the hallway after today's visit. He shared with me that patient is continuing to smoke "at least 1/2 pack" of cigarettes per day.  States that he has been offered smoking cessation counseling/resources in the past and has not wanted to quit.   -Will definitely address at future visits.       Dispo:  -Return to cancer center in 2 weeks for cycle #2 chemotherapy and follow-up visit.  -Refer to [facility]  radiation oncology for consultation for eventual PCI.    All questions were answered to patient's stated satisfaction. Encouraged patient to call with any new concerns or questions before his next visit to the cancer center and we can certain see him sooner, if needed.    Plan of care discussed with Dr. Talbert Cage, who agrees with the above aforementioned.      Orders placed this encounter:  Orders  Placed This Encounter  Procedures  . Phosphorus  . Uric acid      [name], NP [place] [phone]

## 2017-02-20 ENCOUNTER — Encounter (HOSPITAL_COMMUNITY): Payer: Self-pay | Admitting: Adult Health

## 2017-02-20 NOTE — Progress Notes (Unsigned)
Faxed referral and patient records to [facility].

## 2017-02-25 DIAGNOSIS — R590 Localized enlarged lymph nodes: Secondary | ICD-10-CM | POA: Diagnosis not present

## 2017-02-25 DIAGNOSIS — C3411 Malignant neoplasm of upper lobe, right bronchus or lung: Secondary | ICD-10-CM | POA: Diagnosis not present

## 2017-03-02 ENCOUNTER — Encounter (HOSPITAL_BASED_OUTPATIENT_CLINIC_OR_DEPARTMENT_OTHER): Payer: Medicare PPO

## 2017-03-02 ENCOUNTER — Encounter (HOSPITAL_COMMUNITY): Payer: Self-pay

## 2017-03-02 VITALS — BP 139/53 | HR 80 | Temp 97.8°F | Resp 18 | Wt 167.0 lb

## 2017-03-02 DIAGNOSIS — C3490 Malignant neoplasm of unspecified part of unspecified bronchus or lung: Secondary | ICD-10-CM | POA: Diagnosis not present

## 2017-03-02 DIAGNOSIS — Z5111 Encounter for antineoplastic chemotherapy: Secondary | ICD-10-CM | POA: Diagnosis not present

## 2017-03-02 DIAGNOSIS — C770 Secondary and unspecified malignant neoplasm of lymph nodes of head, face and neck: Secondary | ICD-10-CM | POA: Diagnosis not present

## 2017-03-02 DIAGNOSIS — Z23 Encounter for immunization: Secondary | ICD-10-CM | POA: Diagnosis not present

## 2017-03-02 DIAGNOSIS — C787 Secondary malignant neoplasm of liver and intrahepatic bile duct: Secondary | ICD-10-CM

## 2017-03-02 LAB — CBC WITH DIFFERENTIAL/PLATELET
Basophils Absolute: 0 10*3/uL (ref 0.0–0.1)
Basophils Relative: 0 %
Eosinophils Absolute: 0 10*3/uL (ref 0.0–0.7)
Eosinophils Relative: 0 %
HCT: 39.2 % (ref 39.0–52.0)
Hemoglobin: 13.6 g/dL (ref 13.0–17.0)
Lymphocytes Relative: 14 %
Lymphs Abs: 3.1 10*3/uL (ref 0.7–4.0)
MCH: 31.6 pg (ref 26.0–34.0)
MCHC: 34.7 g/dL (ref 30.0–36.0)
MCV: 91 fL (ref 78.0–100.0)
Monocytes Absolute: 1.8 10*3/uL — ABNORMAL HIGH (ref 0.1–1.0)
Monocytes Relative: 8 %
Neutro Abs: 17.4 10*3/uL — ABNORMAL HIGH (ref 1.7–7.7)
Neutrophils Relative %: 78 %
Platelets: 298 10*3/uL (ref 150–400)
RBC: 4.31 MIL/uL (ref 4.22–5.81)
RDW: 17.1 % — ABNORMAL HIGH (ref 11.5–15.5)
WBC: 22.3 10*3/uL — ABNORMAL HIGH (ref 4.0–10.5)

## 2017-03-02 LAB — COMPREHENSIVE METABOLIC PANEL
ALT: 22 U/L (ref 17–63)
AST: 20 U/L (ref 15–41)
Albumin: 3.6 g/dL (ref 3.5–5.0)
Alkaline Phosphatase: 55 U/L (ref 38–126)
Anion gap: 6 (ref 5–15)
BUN: 23 mg/dL — ABNORMAL HIGH (ref 6–20)
CO2: 29 mmol/L (ref 22–32)
Calcium: 8.9 mg/dL (ref 8.9–10.3)
Chloride: 100 mmol/L — ABNORMAL LOW (ref 101–111)
Creatinine, Ser: 0.89 mg/dL (ref 0.61–1.24)
GFR calc Af Amer: >60 mL/min (ref >60)
GFR calc non Af Amer: >60 mL/min (ref >60)
Glucose, Bld: 94 mg/dL (ref 65–99)
Potassium: 3.9 mmol/L (ref 3.5–5.1)
Sodium: 135 mmol/L (ref 135–145)
Total Bilirubin: 0.4 mg/dL (ref 0.3–1.2)
Total Protein: 6.3 g/dL — ABNORMAL LOW (ref 6.5–8.1)

## 2017-03-02 LAB — MAGNESIUM: Magnesium: 2.1 mg/dL (ref 1.7–2.4)

## 2017-03-02 MED ORDER — CISPLATIN CHEMO INJECTION 100MG/100ML
80.0000 mg/m2 | Freq: Once | INTRAVENOUS | Status: AC
  Administered 2017-03-02: 153 mg via INTRAVENOUS
  Filled 2017-03-02: qty 100

## 2017-03-02 MED ORDER — SODIUM CHLORIDE 0.9 % IV SOLN
Freq: Once | INTRAVENOUS | Status: AC
  Administered 2017-03-02: 11:00:00 via INTRAVENOUS

## 2017-03-02 MED ORDER — PALONOSETRON HCL INJECTION 0.25 MG/5ML
0.2500 mg | Freq: Once | INTRAVENOUS | Status: AC
  Administered 2017-03-02: 0.25 mg via INTRAVENOUS
  Filled 2017-03-02: qty 5

## 2017-03-02 MED ORDER — POTASSIUM CHLORIDE 2 MEQ/ML IV SOLN
Freq: Once | INTRAVENOUS | Status: AC
  Administered 2017-03-02: 09:00:00 via INTRAVENOUS
  Filled 2017-03-02: qty 10

## 2017-03-02 MED ORDER — SODIUM CHLORIDE 0.9 % IV SOLN
100.0000 mg/m2 | Freq: Once | INTRAVENOUS | Status: AC
  Administered 2017-03-02: 190 mg via INTRAVENOUS
  Filled 2017-03-02: qty 9.5

## 2017-03-02 MED ORDER — PNEUMOCOCCAL 13-VAL CONJ VACC IM SUSP
0.5000 mL | Freq: Once | INTRAMUSCULAR | Status: AC
  Administered 2017-03-02: 0.5 mL via INTRAMUSCULAR
  Filled 2017-03-02: qty 0.5

## 2017-03-02 MED ORDER — SODIUM CHLORIDE 0.9 % IV SOLN
Freq: Once | INTRAVENOUS | Status: AC
  Administered 2017-03-02: 11:00:00 via INTRAVENOUS
  Filled 2017-03-02: qty 5

## 2017-03-02 NOTE — Progress Notes (Signed)
[name] tolerated chemo tx and Prevnar injection well without complaints or incident. Labs reviewed prior to administering chemotherapy. VSS upon discharge. Pt discharged self ambulatory in satisfactory condition

## 2017-03-03 ENCOUNTER — Encounter (HOSPITAL_BASED_OUTPATIENT_CLINIC_OR_DEPARTMENT_OTHER): Payer: Medicare PPO | Admitting: Oncology

## 2017-03-03 ENCOUNTER — Encounter (HOSPITAL_BASED_OUTPATIENT_CLINIC_OR_DEPARTMENT_OTHER): Payer: Medicare PPO

## 2017-03-03 ENCOUNTER — Encounter (HOSPITAL_COMMUNITY): Payer: Self-pay | Admitting: Oncology

## 2017-03-03 VITALS — BP 137/65 | HR 75 | Temp 98.6°F | Resp 16 | Wt 170.2 lb

## 2017-03-03 DIAGNOSIS — C787 Secondary malignant neoplasm of liver and intrahepatic bile duct: Secondary | ICD-10-CM

## 2017-03-03 DIAGNOSIS — C770 Secondary and unspecified malignant neoplasm of lymph nodes of head, face and neck: Secondary | ICD-10-CM

## 2017-03-03 DIAGNOSIS — F17200 Nicotine dependence, unspecified, uncomplicated: Secondary | ICD-10-CM | POA: Diagnosis not present

## 2017-03-03 DIAGNOSIS — C3490 Malignant neoplasm of unspecified part of unspecified bronchus or lung: Secondary | ICD-10-CM | POA: Diagnosis not present

## 2017-03-03 DIAGNOSIS — Z5111 Encounter for antineoplastic chemotherapy: Secondary | ICD-10-CM

## 2017-03-03 MED ORDER — SODIUM CHLORIDE 0.9% FLUSH: 10.0000 mL | INTRAVENOUS | Status: DC | PRN

## 2017-03-03 MED ORDER — SODIUM CHLORIDE 0.9 % IV SOLN
100.0000 mg/m2 | Freq: Once | INTRAVENOUS | Status: AC
  Administered 2017-03-03: 190 mg via INTRAVENOUS
  Filled 2017-03-03: qty 9.5

## 2017-03-03 MED ORDER — SODIUM CHLORIDE 0.9 % IV SOLN
Freq: Once | INTRAVENOUS | Status: AC
  Administered 2017-03-03: 14:00:00 via INTRAVENOUS

## 2017-03-03 MED ORDER — HEPARIN SOD (PORK) LOCK FLUSH 100 UNIT/ML IV SOLN: 500.0000 [IU] | Freq: Once | INTRAVENOUS | Status: DC

## 2017-03-03 MED ORDER — DEXAMETHASONE SODIUM PHOSPHATE 10 MG/ML IJ SOLN
INTRAMUSCULAR | Status: AC
  Filled 2017-03-03: qty 1

## 2017-03-03 MED ORDER — SODIUM CHLORIDE 0.9 % IV SOLN: 10.0000 mg | Freq: Once | INTRAVENOUS | Status: DC

## 2017-03-03 MED ORDER — DEXAMETHASONE SODIUM PHOSPHATE 10 MG/ML IJ SOLN
10.0000 mg | Freq: Once | INTRAMUSCULAR | Status: AC
  Administered 2017-03-03: 10 mg via INTRAVENOUS

## 2017-03-03 NOTE — Progress Notes (Signed)
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 Bulky 5.4 cm right supraclavicular region malignant lymph node conglomeration with extracapsular extension. 2. Surrounding smaller abnormal right level 3 and level 5 lymph nodes, and the lymphadenopathy continues into the superior mediastinum, see Chest CT findings reported separately. 3. No other metastatic disease identified in the neck.      01/16/2017 Imaging    CT chest: IMPRESSION: 1. Extensive lymphadenopathy in the thorax and lower right cervical region, as discussed above. Primary differential considerations include lymphoma/leukemia or small cell carcinoma of the lung. Further evaluation a PET-CT could be considered to assess for additional sites of disease below the diaphragm if clinically appropriate. Additionally, ultrasound-guided biopsy of supraclavicular lymphadenopathy could be considered to establish a tissue diagnosis. 2. Indeterminate lesion in the periphery of segment 8 of the liver measuring 2.7 x 1.7 cm. Attention at time of follow-up PET-CT is recommended. 3. Aortic atherosclerosis, in addition to left main and 3 vessel coronary artery disease. Please note that although the presence of coronary artery calcium documents the presence of coronary artery disease, the severity of this disease and any potential stenosis cannot be assessed on this non-gated CT examination. Assessment for potential risk factor modification, dietary therapy or pharmacologic therapy may be  warranted, if clinically indicated. 4. There are calcifications of the aortic valve. Echocardiographic correlation for evaluation of potential valvular dysfunction may be warranted if clinically indicated. 5. Diffuse bronchial wall thickening with moderate centrilobular and paraseptal emphysema; imaging findings suggestive of underlying COPD.      02/03/2017 Initial Biopsy    (R) neck lymph node biopsy: SMALL CELL CARCINOMA (most likely lung primary).       02/03/2017 Miscellaneous    Port-a-cath attempted by IR; unable to place d/t enlarged SVC.       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 ([name])     02/09/2017 -  Chemotherapy    The patient had palonosetron (ALOXI) injection 0.25 mg, 0.25 mg, Intravenous,  Once, 1 of 4 cycles  pegfilgrastim (NEULASTA) injection 6 mg, 6 mg, Subcutaneous, Once, 1 of 1 cycle  pegfilgrastim (NEULASTA ONPRO KIT) injection 6 mg, 6 mg, Subcutaneous, Once, 0 of 3 cycles  CISplatin (PLATINOL) 153 mg in sodium chloride 0.9 % 500 mL chemo infusion, 80 mg/m2 = 153 mg, Intravenous,  Once, 1 of 4 cycles  etoposide (VEPESID) 190 mg in sodium chloride 0.9 % 500 mL chemo infusion, 100 mg/m2 = 190 mg, Intravenous,  Once, 1 of 4 cycles  fosaprepitant (EMEND) 150 mg, dexamethasone (DECADRON) 12 mg in sodium chloride 0.9 % 145 mL IVPB, , Intravenous,  Once, 1 of 4 cycles  for chemotherapy treatment.        02/11/2017 Imaging    MRI brain: CLINICAL DATA:  Advanced stage small cell lung cancer. Staging for metastatic disease  EXAM: MRI HEAD WITHOUT AND WITH CONTRAST  TECHNIQUE: Multiplanar, multiecho pulse sequences of the brain and surrounding structures were obtained without and with intravenous contrast.  CONTRAST:  27m MULTIHANCE GADOBENATE DIMEGLUMINE 529 MG/ML IV SOLN  COMPARISON:  None.  FINDINGS: Brain: Negative for hydrocephalus. Cerebral volume normal for age. Small nonenhancing white matter  hyperintensities consistent with mild chronic microvascular ischemia. No acute infarct. Negative for hemorrhage or mass or edema  Normal enhancement postcontrast infusion. No enhancing mass lesion. Leptomeningeal enhancement is normal.  Vascular: Normal arterial flow voids.  Normal venous enhancement  Skull and upper cervical spine: Negative  Sinuses/Orbits: Negative  Other: None  IMPRESSION: Negative for metastatic disease.  No acute abnormality.  Mild chronic white matter changes.         INTERVAL HISTORY:  [name]89y.o. male returns to cancer center for follow-up for extensive stage small cell lung cancer.   He is here for cycle 2 day 2 of chemotherapy with cisplatin and etoposide. He's been tolerating chemotherapy extremely well. He states he has some minor fatigue. He denies any chest pain, shortness breath, abdominal pain, nausea, vomiting, diarrhea. He has occasional constipation. He denies any headaches.   REVIEW OF SYSTEMS:  Review of Systems  Constitutional: Positive for fatigue. Negative for chills and fever.  HENT:  Negative.   Eyes: Negative.   Respiratory: Negative.  Negative for cough and shortness of breath.   Cardiovascular: Negative.  Negative for leg swelling.  Gastrointestinal: Positive for constipation. Negative for abdominal pain, blood in stool, diarrhea, nausea and vomiting.  Endocrine: Negative.   Musculoskeletal: Positive for arthralgias and back pain. Negative for neck pain.  Neurological: Negative for dizziness and headaches.  Hematological: Positive for adenopathy.  Psychiatric/Behavioral: Negative for sleep disturbance.     PAST MEDICAL/SURGICAL HISTORY:  Past Medical History:  Diagnosis Date  . Anxiety   . CAD (coronary artery disease)   . COPD (chronic obstructive pulmonary disease) ([facility]   . Depression   . Myocardial infarction ([facility]   . Osteopenia   . Panic attacks   . Smoker    Past Surgical History:  Procedure  Laterality Date  . BACK SURGERY  12/24/2000   L5,S1  . CORONARY STENT PLACEMENT  2005   RCA & CX  . HERNIA REPAIR Right 1980's  . INGUINAL HERNIA REPAIR  12/1978   right side  . IR [place] BX ASP/DRAIN  02/03/2017  . NM MYOCAR PERF WALL MOTION  09/07/2009   No ischemia; EF 51%  . SHOULDER SURGERY Left 08/2010  . SPINE SURGERY  2002   L5-S1     SOCIAL HISTORY:  Social History   Social History  . Marital status: Single    Spouse name: N/A  . Number of children: N/A  . Years of education: N/A   Occupational History  . Not on file.   Social History Main Topics  . Smoking status: Current Every Day Smoker    Packs/day: 1.00    Types: Cigarettes  . Smokeless tobacco: Never Used  . Alcohol use No  . Drug use: No  . Sexual activity: Not on file   Other Topics Concern  . Not on file   Social History Narrative  . No narrative on file    FAMILY HISTORY:  Family History  Problem Relation Age of Onset  . Heart attack Father   . Kidney disease Father        renal failure  . Heart failure Mother   . Heart attack Mother   . Cancer Brother   . Diabetes Brother   . Alcohol abuse Brother   . Diabetes Sister  CURRENT MEDICATIONS:  Outpatient Encounter Prescriptions as of 03/03/2017  Medication Sig  . ALPRAZolam (XANAX) 1 MG tablet Take 1 mg by mouth 4 (four) times daily.  Marland Kitchen aspirin EC 81 MG tablet Take 81 mg by mouth at bedtime.  Marland Kitchen atorvastatin (LIPITOR) 10 MG tablet TAKE 1 TABLET AT BEDTIME  (DISCONTINUE ATORVASTATIN '20MG'$ )  . CISPLATIN IV Inject into the vein. Day 1 every 21 days  . clotrimazole-betamethasone (LOTRISONE) cream Apply 1 application topically 2 (two) times daily.  Marland Kitchen dexamethasone (DECADRON) 4 MG tablet Take 2 tablets two times a day for 1 day on day 4 after cisplatin chemotherapy. Take with food.  . ETOPOSIDE IV Inject into the vein. Days 1,2,3 every 21 days  . HYDROcodone-acetaminophen (NORCO) 5-325 MG tablet Take 0.5-1 tablets by mouth every 6 (six)  hours as needed for moderate pain.  Marland Kitchen lidocaine-prilocaine (EMLA) cream Apply to affected area once  . LORazepam (ATIVAN) 0.5 MG tablet Take 1 tablet (0.5 mg total) by mouth every 6 (six) hours as needed (Nausea or vomiting).  . ondansetron (ZOFRAN) 8 MG tablet Take 1 tablet (8 mg total) by mouth 2 (two) times daily as needed. Start on the third day after cisplatin chemotherapy.  . Pegfilgrastim (NEULASTA ONPRO [place]) Inject into the skin. Every 21 days  . polyethylene glycol powder (GLYCOLAX/MIRALAX) powder Take 17 g by mouth daily.  . prochlorperazine (COMPAZINE) 10 MG tablet Take 1 tablet (10 mg total) by mouth every 6 (six) hours as needed (Nausea or vomiting).  . traZODone (DESYREL) 100 MG tablet Take 400 mg by mouth at bedtime.  Marland Kitchen venlafaxine (EFFEXOR) 75 MG tablet Take 150 mg by mouth 2 (two) times daily.   Marland Kitchen co-enzyme Q-10 30 MG capsule Take 1 capsule (30 mg total) by mouth daily. (Patient not taking: Reported on 03/03/2017)  . cyclobenzaprine (FLEXERIL) 10 MG tablet TAKE 1 TABLET THREE TIMES DAILY AS NEEDED FOR MUSCLE SPASMS. (Patient not taking: Reported on 03/03/2017)  . magnesium oxide (MAG-OX) 400 MG tablet Take 400 mg by mouth daily.  . [DISCONTINUED] alendronate (FOSAMAX) 70 MG tablet Take 1 tablet (70 mg total) by mouth every 7 (seven) days. Take with a full glass of water on an empty stomach.   Facility-Administered Encounter Medications as of 03/03/2017  Medication  . [COMPLETED] 0.9 %  sodium chloride infusion  . [COMPLETED] dexamethasone (DECADRON) injection 10 mg  . etoposide (VEPESID) 190 mg in sodium chloride 0.9 % 500 mL chemo infusion  . heparin lock flush 100 unit/mL  . sodium chloride flush (NS) 0.9 % injection 10 mL  . [DISCONTINUED] dexamethasone (DECADRON) 10 mg in sodium chloride 0.9 % 50 mL IVPB    ALLERGIES:  Allergies  Allergen Reactions  . Codeine Nausea Only  . Niaspan [Niacin Er]      PHYSICAL EXAM:  ECOG Performance status: 1 - Symptomatic; remains  independent   RN vitals reviewed.   Physical Exam  Constitutional: He is oriented to person, place, and time.  Thin, pale male in no acute distress   HENT:  Head: Normocephalic.  Mouth/Throat: Oropharynx is clear and moist. No oropharyngeal exudate.  Eyes: Pupils are equal, round, and reactive to light. Conjunctivae are normal. No scleral icterus.  Neck: Normal range of motion.  (R) supraclavicular with clinical improvement in size from previous. Now measuring about 3 cm (previously 5-6+ cm).    Cardiovascular: Normal rate and regular rhythm.   Pulmonary/Chest: Effort normal and breath sounds normal.  Mild rhonchi to bilat upper lobes  Abdominal: Soft. Bowel sounds are normal. There is no tenderness. There is no rebound.  Musculoskeletal: Normal range of motion. He exhibits no edema.  Neurological: He is alert and oriented to person, place, and time. No cranial nerve deficit. Gait normal.  Skin: Skin is warm. No pallor.  Psychiatric: Mood, memory, affect and judgment normal.  Nursing note and vitals reviewed.    LABORATORY DATA:  I have reviewed the labs as listed.  CBC    Component Value Date/Time   WBC 22.3 (H) 03/02/2017 0846   RBC 4.31 03/02/2017 0846   HGB 13.6 03/02/2017 0846   HCT 39.2 03/02/2017 0846   PLT 298 03/02/2017 0846   MCV 91.0 03/02/2017 0846   MCH 31.6 03/02/2017 0846   MCHC 34.7 03/02/2017 0846   RDW 17.1 (H) 03/02/2017 0846   LYMPHSABS 3.1 03/02/2017 0846   MONOABS 1.8 (H) 03/02/2017 0846   EOSABS 0.0 03/02/2017 0846   BASOSABS 0.0 03/02/2017 0846   CMP Latest Ref Rng & Units 03/02/2017 02/19/2017 02/09/2017  Glucose 65 - 99 mg/dL 94 97 102(H)  BUN 6 - 20 mg/dL 23(H) 21(H) 13  Creatinine 0.61 - 1.24 mg/dL 0.89 0.93 0.91  Sodium 135 - 145 mmol/L 135 133(L) 139  Potassium 3.5 - 5.1 mmol/L 3.9 3.7 4.2  Chloride 101 - 111 mmol/L 100(L) 101 110  CO2 22 - 32 mmol/L '29 26 23  '$ Calcium 8.9 - 10.3 mg/dL 8.9 9.0 8.7(L)  Total Protein 6.5 - 8.1 g/dL 6.3(L)  6.7 6.7  Total Bilirubin 0.3 - 1.2 mg/dL 0.4 0.6 0.5  Alkaline Phos 38 - 126 U/L 55 64 67  AST 15 - 41 U/L '20 17 19  '$ ALT 17 - 63 U/L 22 23 11(L)    PENDING LABS:    DIAGNOSTIC IMAGING:  *The following radiologic images and reports have been reviewed independently and agree with below findings.  CT neck: 01/16/17     CT chest: 01/16/17    PET scan: 02/02/17    MRI brain: 02/11/17 CLINICAL DATA:  Advanced stage small cell lung cancer. Staging for metastatic disease  EXAM: MRI HEAD WITHOUT AND WITH CONTRAST  TECHNIQUE: Multiplanar, multiecho pulse sequences of the brain and surrounding structures were obtained without and with intravenous contrast.  CONTRAST:  54m MULTIHANCE GADOBENATE DIMEGLUMINE 529 MG/ML IV SOLN  COMPARISON:  None.  FINDINGS: Brain: Negative for hydrocephalus. Cerebral volume normal for age. Small nonenhancing white matter hyperintensities consistent with mild chronic microvascular ischemia. No acute infarct. Negative for hemorrhage or mass or edema  Normal enhancement postcontrast infusion. No enhancing mass lesion. Leptomeningeal enhancement is normal.  Vascular: Normal arterial flow voids.  Normal venous enhancement  Skull and upper cervical spine: Negative  Sinuses/Orbits: Negative  Other: None  IMPRESSION: Negative for metastatic disease.  No acute abnormality.  Mild chronic white matter changes.   Electronically Signed   By: CFranchot GalloM.D.   On: 02/11/2017 15:35    PATHOLOGY:  (R) neck lymph node biopsy: 02/03/17          ASSESSMENT & PLAN:   Extensive-stage small cell lung cancer with liver mets:  -Initially presented to his PCP in early 01/2017 with 3-week history of right neck mass.  CT chest performed on 01/16/17 revealed extensive lymphadenopathy in chest and lower right cervical lymph node area; also indeterminate lesion in liver.  CT neck also performed on 01/16/17 also showed bulky (R)  supraclavicular malignant lymph node measuring 5.4 cm, with several abnormal level 3 & 4 cervical  lymph nodes and lymphadenopathy to superior mediastinum. IR attempted to place port-a-cath for anticipated chemotherapy; they were unable to place port d/t enlarged SVC and aborted procedure d/t concerns for patient safety.  Biopsy of (R) neck mass revealed small cell carcinoma, most consistent with a lung primary.   PET scan does reveal hypermetabolic liver lesion, as well as multiple sites of hypermetabolic lymphadenopathy. He understands that his cancer is treatable, but not curable.   -Continue chemotherapy as planned. Plan for 6 cycles of cisplatin and etoposide. Plan to restage to assess for response to treatment with PET CT after cycle 3 of chemo. -Clinially he is responding well with significant decrease on physical exam of his right supraclavicular lymphadenopathy. -After he completes 6 cycles of chemo, he will return back to rad-onc for evaluation for PCI.   Tobacco use disorder:  -Continue efforts on smoke cessation.  Dispo: RTC in 3 weeks for follow up.   All questions were answered to patient's stated satisfaction. Encouraged patient to call with any new concerns or questions before his next visit to the cancer center and we can certain see him sooner, if needed.    Twana First, MD

## 2017-03-03 NOTE — Progress Notes (Signed)
Chemotherapy given today per orders . Patient tolerated it well, vitals stable and discharged home from clinic. Follow up as scheduled.

## 2017-03-04 ENCOUNTER — Encounter (HOSPITAL_BASED_OUTPATIENT_CLINIC_OR_DEPARTMENT_OTHER): Payer: Medicare PPO

## 2017-03-04 VITALS — BP 158/75 | HR 73 | Temp 97.6°F | Resp 18

## 2017-03-04 DIAGNOSIS — C3490 Malignant neoplasm of unspecified part of unspecified bronchus or lung: Secondary | ICD-10-CM

## 2017-03-04 DIAGNOSIS — Z5111 Encounter for antineoplastic chemotherapy: Secondary | ICD-10-CM

## 2017-03-04 DIAGNOSIS — C787 Secondary malignant neoplasm of liver and intrahepatic bile duct: Secondary | ICD-10-CM | POA: Diagnosis not present

## 2017-03-04 DIAGNOSIS — C770 Secondary and unspecified malignant neoplasm of lymph nodes of head, face and neck: Secondary | ICD-10-CM | POA: Diagnosis not present

## 2017-03-04 MED ORDER — SODIUM CHLORIDE 0.9 % IV SOLN
10.0000 mg | Freq: Once | INTRAVENOUS | Status: DC
  Filled 2017-03-04: qty 1

## 2017-03-04 MED ORDER — SODIUM CHLORIDE 0.9 % IV SOLN
Freq: Once | INTRAVENOUS | Status: AC
  Administered 2017-03-04: 14:00:00 via INTRAVENOUS

## 2017-03-04 MED ORDER — DEXAMETHASONE SODIUM PHOSPHATE 10 MG/ML IJ SOLN
10.0000 mg | Freq: Once | INTRAMUSCULAR | Status: AC
  Administered 2017-03-04: 10 mg via INTRAVENOUS

## 2017-03-04 MED ORDER — DEXAMETHASONE SODIUM PHOSPHATE 10 MG/ML IJ SOLN
INTRAMUSCULAR | Status: AC
  Filled 2017-03-04: qty 1

## 2017-03-04 MED ORDER — SODIUM CHLORIDE 0.9 % IV SOLN
100.0000 mg/m2 | Freq: Once | INTRAVENOUS | Status: AC
  Administered 2017-03-04: 190 mg via INTRAVENOUS
  Filled 2017-03-04: qty 9.5

## 2017-03-04 MED ORDER — PEGFILGRASTIM 6 MG/0.6ML ~~LOC~~ PSKT
6.0000 mg | PREFILLED_SYRINGE | Freq: Once | SUBCUTANEOUS | Status: AC
  Administered 2017-03-04: 6 mg via SUBCUTANEOUS

## 2017-03-04 MED ORDER — PEGFILGRASTIM 6 MG/0.6ML ~~LOC~~ PSKT
PREFILLED_SYRINGE | SUBCUTANEOUS | Status: AC
  Filled 2017-03-04: qty 0.6

## 2017-03-04 NOTE — Progress Notes (Signed)
[name] tolerated chemo tx well without complaints or incident. VSS upon discharge. Neulasta on-pro intact with green indicator light flashing upon discharge Pt discharged self ambulatory in satisfactory condition

## 2017-03-23 ENCOUNTER — Encounter (HOSPITAL_COMMUNITY): Payer: Medicare PPO | Attending: Oncology

## 2017-03-23 ENCOUNTER — Encounter (HOSPITAL_COMMUNITY): Payer: Self-pay

## 2017-03-23 VITALS — BP 138/63 | HR 67 | Temp 97.4°F | Resp 18 | Wt 166.6 lb

## 2017-03-23 DIAGNOSIS — C787 Secondary malignant neoplasm of liver and intrahepatic bile duct: Secondary | ICD-10-CM

## 2017-03-23 DIAGNOSIS — C3490 Malignant neoplasm of unspecified part of unspecified bronchus or lung: Secondary | ICD-10-CM | POA: Diagnosis not present

## 2017-03-23 DIAGNOSIS — Z5111 Encounter for antineoplastic chemotherapy: Secondary | ICD-10-CM | POA: Diagnosis not present

## 2017-03-23 DIAGNOSIS — C770 Secondary and unspecified malignant neoplasm of lymph nodes of head, face and neck: Secondary | ICD-10-CM | POA: Diagnosis not present

## 2017-03-23 LAB — CBC WITH DIFFERENTIAL/PLATELET
Basophils Absolute: 0 10*3/uL (ref 0.0–0.1)
Basophils Relative: 0 %
Eosinophils Absolute: 0 10*3/uL (ref 0.0–0.7)
Eosinophils Relative: 0 %
HCT: 33.7 % — ABNORMAL LOW (ref 39.0–52.0)
Hemoglobin: 11.5 g/dL — ABNORMAL LOW (ref 13.0–17.0)
Lymphocytes Relative: 9 %
Lymphs Abs: 2.3 10*3/uL (ref 0.7–4.0)
MCH: 31.7 pg (ref 26.0–34.0)
MCHC: 34.1 g/dL (ref 30.0–36.0)
MCV: 92.8 fL (ref 78.0–100.0)
Monocytes Absolute: 1.8 10*3/uL — ABNORMAL HIGH (ref 0.1–1.0)
Monocytes Relative: 7 %
Neutro Abs: 21.7 10*3/uL — ABNORMAL HIGH (ref 1.7–7.7)
Neutrophils Relative %: 84 %
Platelets: 358 10*3/uL (ref 150–400)
RBC: 3.63 MIL/uL — ABNORMAL LOW (ref 4.22–5.81)
RDW: 18.7 % — ABNORMAL HIGH (ref 11.5–15.5)
WBC: 25.8 10*3/uL — ABNORMAL HIGH (ref 4.0–10.5)

## 2017-03-23 LAB — COMPREHENSIVE METABOLIC PANEL
ALT: 20 U/L (ref 17–63)
AST: 18 U/L (ref 15–41)
Albumin: 3.5 g/dL (ref 3.5–5.0)
Alkaline Phosphatase: 67 U/L (ref 38–126)
Anion gap: 9 (ref 5–15)
BUN: 22 mg/dL — ABNORMAL HIGH (ref 6–20)
CO2: 27 mmol/L (ref 22–32)
Calcium: 9.1 mg/dL (ref 8.9–10.3)
Chloride: 99 mmol/L — ABNORMAL LOW (ref 101–111)
Creatinine, Ser: 0.8 mg/dL (ref 0.61–1.24)
GFR calc Af Amer: >60 mL/min (ref >60)
GFR calc non Af Amer: >60 mL/min (ref >60)
Glucose, Bld: 110 mg/dL — ABNORMAL HIGH (ref 65–99)
Potassium: 4.3 mmol/L (ref 3.5–5.1)
Sodium: 135 mmol/L (ref 135–145)
Total Bilirubin: 0.4 mg/dL (ref 0.3–1.2)
Total Protein: 6.5 g/dL (ref 6.5–8.1)

## 2017-03-23 LAB — MAGNESIUM: Magnesium: 1.9 mg/dL (ref 1.7–2.4)

## 2017-03-23 MED ORDER — POTASSIUM CHLORIDE 2 MEQ/ML IV SOLN
Freq: Once | INTRAVENOUS | Status: AC
  Administered 2017-03-23: 11:00:00 via INTRAVENOUS
  Filled 2017-03-23: qty 10

## 2017-03-23 MED ORDER — SODIUM CHLORIDE 0.9% FLUSH
10.0000 mL | INTRAVENOUS | Status: DC | PRN
  Administered 2017-03-23: 10 mL
  Filled 2017-03-23: qty 10

## 2017-03-23 MED ORDER — SODIUM CHLORIDE 0.9 % IV SOLN
100.0000 mg/m2 | Freq: Once | INTRAVENOUS | Status: AC
  Administered 2017-03-23: 190 mg via INTRAVENOUS
  Filled 2017-03-23: qty 9.5

## 2017-03-23 MED ORDER — CISPLATIN CHEMO INJECTION 100MG/100ML
80.0000 mg/m2 | Freq: Once | INTRAVENOUS | Status: AC
  Administered 2017-03-23: 153 mg via INTRAVENOUS
  Filled 2017-03-23: qty 153

## 2017-03-23 MED ORDER — PALONOSETRON HCL INJECTION 0.25 MG/5ML
0.2500 mg | Freq: Once | INTRAVENOUS | Status: AC
  Administered 2017-03-23: 0.25 mg via INTRAVENOUS

## 2017-03-23 MED ORDER — SODIUM CHLORIDE 0.9 % IV SOLN
Freq: Once | INTRAVENOUS | Status: AC
  Administered 2017-03-23: 500 mL via INTRAVENOUS

## 2017-03-23 MED ORDER — SODIUM CHLORIDE 0.9 % IV SOLN
Freq: Once | INTRAVENOUS | Status: AC
  Administered 2017-03-23: 10:00:00 via INTRAVENOUS
  Filled 2017-03-23: qty 5

## 2017-03-23 NOTE — Progress Notes (Signed)
To treatment room for chemotherapy.  Labs drawn.  Patient stated he has ringing in his ears the week after chemotherapy but denied ringing today. Denied hearing loss.  Controls constipation with miralax when having difficulty with bowel movements.  Tingling in toes but no worsening.  Denied SOB.  Uses compression hose for lower leg swelling.  Eating three meals a day now per patient.   Reviewed labs and patients complaints with Dr. Talbert Cage.  Ok to treat today.   Patient tolerated chemotherapy with no complaints voiced.  IV site clean and dry with no bruising or swelling noted at site. Band aid applied.  VSs with discharge and left ambulatory with family.

## 2017-03-23 NOTE — Patient Instructions (Signed)
[facility] Discharge Instructions for Patients Receiving Chemotherapy  Today you received the following chemotherapy agents cisplatin and etoposide  To help prevent nausea and vomiting after your treatment, we encourage you to take your nausea medication.   If you develop nausea and vomiting that is not controlled by your nausea medication, call the clinic.   BELOW ARE SYMPTOMS THAT SHOULD BE REPORTED IMMEDIATELY:  *FEVER GREATER THAN 100.5 F  *CHILLS WITH OR WITHOUT FEVER  NAUSEA AND VOMITING THAT IS NOT CONTROLLED WITH YOUR NAUSEA MEDICATION  *UNUSUAL SHORTNESS OF BREATH  *UNUSUAL BRUISING OR BLEEDING  TENDERNESS IN MOUTH AND THROAT WITH OR WITHOUT PRESENCE OF ULCERS  *URINARY PROBLEMS  *BOWEL PROBLEMS  UNUSUAL RASH Items with * indicate a potential emergency and should be followed up as soon as possible.  Feel free to call the clinic you have any questions or concerns. The clinic phone number is [phone].  Please show the [facility] at check-in to the Emergency Department and triage nurse.

## 2017-03-24 ENCOUNTER — Encounter (HOSPITAL_COMMUNITY): Payer: Self-pay

## 2017-03-24 ENCOUNTER — Encounter (HOSPITAL_BASED_OUTPATIENT_CLINIC_OR_DEPARTMENT_OTHER): Payer: Medicare PPO

## 2017-03-24 VITALS — BP 144/55 | HR 75 | Temp 98.0°F | Resp 18 | Wt 168.6 lb

## 2017-03-24 DIAGNOSIS — C787 Secondary malignant neoplasm of liver and intrahepatic bile duct: Secondary | ICD-10-CM | POA: Diagnosis not present

## 2017-03-24 DIAGNOSIS — C770 Secondary and unspecified malignant neoplasm of lymph nodes of head, face and neck: Secondary | ICD-10-CM

## 2017-03-24 DIAGNOSIS — C3490 Malignant neoplasm of unspecified part of unspecified bronchus or lung: Secondary | ICD-10-CM

## 2017-03-24 DIAGNOSIS — Z5111 Encounter for antineoplastic chemotherapy: Secondary | ICD-10-CM | POA: Diagnosis not present

## 2017-03-24 MED ORDER — SODIUM CHLORIDE 0.9 % IV SOLN: 10.0000 mg | Freq: Once | INTRAVENOUS | Status: DC

## 2017-03-24 MED ORDER — SODIUM CHLORIDE 0.9 % IV SOLN
100.0000 mg/m2 | Freq: Once | INTRAVENOUS | Status: AC
  Administered 2017-03-24: 190 mg via INTRAVENOUS
  Filled 2017-03-24: qty 9.5

## 2017-03-24 MED ORDER — SODIUM CHLORIDE 0.9% FLUSH
10.0000 mL | INTRAVENOUS | Status: DC | PRN
  Administered 2017-03-24: 10 mL
  Filled 2017-03-24: qty 10

## 2017-03-24 MED ORDER — DEXAMETHASONE SODIUM PHOSPHATE 10 MG/ML IJ SOLN
10.0000 mg | Freq: Once | INTRAMUSCULAR | Status: AC
  Administered 2017-03-24: 10 mg via INTRAVENOUS
  Filled 2017-03-24: qty 1

## 2017-03-24 MED ORDER — SODIUM CHLORIDE 0.9 % IV SOLN
Freq: Once | INTRAVENOUS | Status: AC
  Administered 2017-03-24: 14:00:00 via INTRAVENOUS

## 2017-03-24 NOTE — Patient Instructions (Signed)
[facility] Discharge Instructions for Patients Receiving Chemotherapy  Today you received the following chemotherapy agents Etoposide.   To help prevent nausea and vomiting after your treatment, we encourage you to take your nausea medication.    If you develop nausea and vomiting that is not controlled by your nausea medication, call the clinic.   BELOW ARE SYMPTOMS THAT SHOULD BE REPORTED IMMEDIATELY:  *FEVER GREATER THAN 100.5 F  *CHILLS WITH OR WITHOUT FEVER  NAUSEA AND VOMITING THAT IS NOT CONTROLLED WITH YOUR NAUSEA MEDICATION  *UNUSUAL SHORTNESS OF BREATH  *UNUSUAL BRUISING OR BLEEDING  TENDERNESS IN MOUTH AND THROAT WITH OR WITHOUT PRESENCE OF ULCERS  *URINARY PROBLEMS  *BOWEL PROBLEMS  UNUSUAL RASH Items with * indicate a potential emergency and should be followed up as soon as possible.  Feel free to call the clinic you have any questions or concerns. The clinic phone number is [phone].  Please show the [place] at check-in to the Emergency Department and triage nurse.

## 2017-03-24 NOTE — Progress Notes (Signed)
To treatment room for chemotherapy.  No new complaints voiced today.    Patient tolerated chemotherapy with no problems.  IV site clean and dry with no bruising or swelling noted at site.  Wrapped with kerlix for protection.  VSS and discharged ambulatory with family.  No complaints voiced.

## 2017-03-25 ENCOUNTER — Encounter (HOSPITAL_BASED_OUTPATIENT_CLINIC_OR_DEPARTMENT_OTHER): Payer: Medicare PPO | Admitting: Oncology

## 2017-03-25 ENCOUNTER — Encounter (HOSPITAL_BASED_OUTPATIENT_CLINIC_OR_DEPARTMENT_OTHER): Payer: Medicare PPO

## 2017-03-25 ENCOUNTER — Encounter (HOSPITAL_COMMUNITY): Payer: Self-pay

## 2017-03-25 VITALS — BP 153/63 | HR 63 | Temp 97.7°F | Resp 16

## 2017-03-25 VITALS — Wt 170.4 lb

## 2017-03-25 DIAGNOSIS — C770 Secondary and unspecified malignant neoplasm of lymph nodes of head, face and neck: Secondary | ICD-10-CM

## 2017-03-25 DIAGNOSIS — Z5111 Encounter for antineoplastic chemotherapy: Secondary | ICD-10-CM | POA: Diagnosis not present

## 2017-03-25 DIAGNOSIS — Z5189 Encounter for other specified aftercare: Secondary | ICD-10-CM

## 2017-03-25 DIAGNOSIS — C787 Secondary malignant neoplasm of liver and intrahepatic bile duct: Secondary | ICD-10-CM

## 2017-03-25 DIAGNOSIS — C3490 Malignant neoplasm of unspecified part of unspecified bronchus or lung: Secondary | ICD-10-CM

## 2017-03-25 DIAGNOSIS — Z720 Tobacco use: Secondary | ICD-10-CM

## 2017-03-25 MED ORDER — DEXAMETHASONE SODIUM PHOSPHATE 10 MG/ML IJ SOLN
10.0000 mg | Freq: Once | INTRAMUSCULAR | Status: AC
  Administered 2017-03-25: 10 mg via INTRAVENOUS

## 2017-03-25 MED ORDER — PEGFILGRASTIM 6 MG/0.6ML ~~LOC~~ PSKT
PREFILLED_SYRINGE | SUBCUTANEOUS | Status: AC
  Filled 2017-03-25: qty 0.6

## 2017-03-25 MED ORDER — SODIUM CHLORIDE 0.9 % IV SOLN
100.0000 mg/m2 | Freq: Once | INTRAVENOUS | Status: AC
  Administered 2017-03-25: 190 mg via INTRAVENOUS
  Filled 2017-03-25: qty 9.5

## 2017-03-25 MED ORDER — PEGFILGRASTIM 6 MG/0.6ML ~~LOC~~ PSKT
6.0000 mg | PREFILLED_SYRINGE | Freq: Once | SUBCUTANEOUS | Status: AC
  Administered 2017-03-25: 6 mg via SUBCUTANEOUS

## 2017-03-25 MED ORDER — DEXAMETHASONE SODIUM PHOSPHATE 10 MG/ML IJ SOLN
INTRAMUSCULAR | Status: AC
  Filled 2017-03-25: qty 1

## 2017-03-25 MED ORDER — SODIUM CHLORIDE 0.9 % IV SOLN
Freq: Once | INTRAVENOUS | Status: AC
  Administered 2017-03-25: 13:00:00 via INTRAVENOUS

## 2017-03-25 MED ORDER — SODIUM CHLORIDE 0.9 % IV SOLN
10.0000 mg | Freq: Once | INTRAVENOUS | Status: DC
Start: 2017-03-25 — End: 2017-03-25

## 2017-03-25 NOTE — Patient Instructions (Signed)
[place] at [facility] Discharge Instructions  RECOMMENDATIONS MADE BY THE CONSULTANT AND ANY TEST RESULTS WILL BE SENT TO YOUR REFERRING PHYSICIAN.  You were seen today by Dr. Twana First You will get treatment today Follow up in 3 weeks with next treatment and visit with physician You will have your CT scan a few days prior to that appointment and we will review results at your next visit.   Thank you for choosing [place] at [facility] to provide your oncology and hematology care.  To afford each patient quality time with our provider, please arrive at least 15 minutes before your scheduled appointment time.    If you have a lab appointment with the [facility] please come in thru the  Main Entrance and check in at the main information desk  You need to re-schedule your appointment should you arrive 10 or more minutes late.  We strive to give you quality time with our providers, and arriving late affects you and other patients whose appointments are after yours.  Also, if you no show three or more times for appointments you may be dismissed from the clinic at the providers discretion.     Again, thank you for choosing [facility].  Our hope is that these requests will decrease the amount of time that you wait before being seen by our physicians.       _____________________________________________________________  Should you have questions after your visit to [facility], please contact our office at [phone] between the hours of 8:30 a.m. and 4:30 p.m.  Voicemails left after 4:30 p.m. will not be returned until the following business day.  For prescription refill requests, have your pharmacy contact our office.       Resources For Cancer Patients and their Caregivers ? American Cancer Society: Can assist with transportation, wigs, general needs, runs Look Good Feel Better.         [phone] ? Cancer Care: Provides financial assistance, online support groups, medication/co-pay assistance.  1-800-813-HOPE 321-640-9682) ? [place] Assists [place] cancer patients and their families through emotional , educational and financial support.  575-464-9852 ? [place] DSS Where to apply for food stamps, Medicaid and utility assistance. 450-503-5139 ? [facility]: Transportation to medical appointments. (312)417-4280 ? Social Security Administration: May apply for disability if have a Stage IV cancer. 567-655-7679 619-885-7575 ? [employer], Disability and Transit Services: Assists with nutrition, care and transit needs. [place] Support Programs: @10RELATIVEDAYS @ > Cancer Support Group  2nd Tuesday of the month 1pm-2pm, Journey Room  > Creative Journey  3rd Tuesday of the month 1130am-1pm, Journey Room  > Look Good Feel Better  1st Wednesday of the month 10am-12 noon, Journey Room (Call [place] to register [phone])

## 2017-03-25 NOTE — Progress Notes (Signed)
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 Bulky 5.4 cm right supraclavicular region malignant lymph node conglomeration with extracapsular extension. 2. Surrounding smaller abnormal right level 3 and level 5 lymph nodes, and the lymphadenopathy continues into the superior mediastinum, see Chest CT findings reported separately. 3. No other metastatic disease identified in the neck.      01/16/2017 Imaging    CT chest: IMPRESSION: 1. Extensive lymphadenopathy in the thorax and lower right cervical region, as discussed above. Primary differential considerations include lymphoma/leukemia or small cell carcinoma of the lung. Further evaluation a PET-CT could be considered to assess for additional sites of disease below the diaphragm if clinically appropriate. Additionally, ultrasound-guided biopsy of supraclavicular lymphadenopathy could be considered to establish a tissue diagnosis. 2. Indeterminate lesion in the periphery of segment 8 of the liver measuring 2.7 x 1.7 cm. Attention at time of follow-up PET-CT is recommended. 3. Aortic atherosclerosis, in addition to left main and 3 vessel coronary artery disease. Please note that although the presence of coronary artery calcium documents the presence of coronary artery disease, the severity of this disease and any potential stenosis cannot be assessed on this non-gated CT examination. Assessment for potential risk factor modification, dietary therapy or pharmacologic therapy may be  warranted, if clinically indicated. 4. There are calcifications of the aortic valve. Echocardiographic correlation for evaluation of potential valvular dysfunction may be warranted if clinically indicated. 5. Diffuse bronchial wall thickening with moderate centrilobular and paraseptal emphysema; imaging findings suggestive of underlying COPD.      02/03/2017 Initial Biopsy    (R) neck lymph node biopsy: SMALL CELL CARCINOMA (most likely lung primary).       02/03/2017 Miscellaneous    Port-a-cath attempted by IR; unable to place d/t enlarged SVC.       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 ([name])     02/09/2017 -  Chemotherapy    The patient had palonosetron (ALOXI) injection 0.25 mg, 0.25 mg, Intravenous,  Once, 1 of 4 cycles  pegfilgrastim (NEULASTA) injection 6 mg, 6 mg, Subcutaneous, Once, 1 of 1 cycle  pegfilgrastim (NEULASTA ONPRO KIT) injection 6 mg, 6 mg, Subcutaneous, Once, 0 of 3 cycles  CISplatin (PLATINOL) 153 mg in sodium chloride 0.9 % 500 mL chemo infusion, 80 mg/m2 = 153 mg, Intravenous,  Once, 1 of 4 cycles  etoposide (VEPESID) 190 mg in sodium chloride 0.9 % 500 mL chemo infusion, 100 mg/m2 = 190 mg, Intravenous,  Once, 1 of 4 cycles  fosaprepitant (EMEND) 150 mg, dexamethasone (DECADRON) 12 mg in sodium chloride 0.9 % 145 mL IVPB, , Intravenous,  Once, 1 of 4 cycles  for chemotherapy treatment.        02/11/2017 Imaging    MRI brain: CLINICAL DATA:  Advanced stage small cell lung cancer. Staging for metastatic disease  EXAM: MRI HEAD WITHOUT AND WITH CONTRAST  TECHNIQUE: Multiplanar, multiecho pulse sequences of the brain and surrounding structures were obtained without and with intravenous contrast.  CONTRAST:  72m MULTIHANCE GADOBENATE DIMEGLUMINE 529 MG/ML IV SOLN  COMPARISON:  None.  FINDINGS: Brain: Negative for hydrocephalus. Cerebral volume normal for age. Small nonenhancing white matter  hyperintensities consistent with mild chronic microvascular ischemia. No acute infarct. Negative for hemorrhage or mass or edema  Normal enhancement postcontrast infusion. No enhancing mass lesion. Leptomeningeal enhancement is normal.  Vascular: Normal arterial flow voids.  Normal venous enhancement  Skull and upper cervical spine: Negative  Sinuses/Orbits: Negative  Other: None  IMPRESSION: Negative for metastatic disease.  No acute abnormality.  Mild chronic white matter changes.         INTERVAL HISTORY:  [name]89y.o. male returns to cancer center for follow-up for extensive stage small cell lung cancer.   He is here for cycle 3 day 3 of chemotherapy with cisplatin and etoposide. He's been tolerating chemotherapy well. He denies any fatigue. He states his energy level is really good. His appetite is good and he is gaining weight. He denies any SOB, CP, abdominal pain, focal weakness, headaches, nausea, vomiting, diarrhea. He recently had one episode of dizziness but then found out his BP was running a little low. He has some chronic constipation but he states the miralax is helping. He continues to smoke 1ppd.  REVIEW OF SYSTEMS:  Review of Systems  Constitutional: Negative for chills, fatigue and fever.  HENT:  Negative.   Eyes: Negative.   Respiratory: Negative.  Negative for cough and shortness of breath.   Cardiovascular: Negative.  Negative for leg swelling.  Gastrointestinal: Positive for constipation. Negative for abdominal pain, blood in stool, diarrhea, nausea and vomiting.  Endocrine: Negative.   Musculoskeletal: Negative for arthralgias, back pain and neck pain.  Neurological: Negative for dizziness and headaches.  Hematological: Positive for adenopathy.  Psychiatric/Behavioral: Negative for sleep disturbance.     PAST MEDICAL/SURGICAL HISTORY:  Past Medical History:  Diagnosis Date  . Anxiety   . CAD (coronary artery disease)   . COPD  (chronic obstructive pulmonary disease) ([place]   . Depression   . Myocardial infarction ([place]   . Osteopenia   . Panic attacks   . Smoker    Past Surgical History:  Procedure Laterality Date  . BACK SURGERY  12/24/2000   L5,S1  . CORONARY STENT PLACEMENT  2005   RCA & CX  . HERNIA REPAIR Right 1980's  . INGUINAL HERNIA REPAIR  12/1978   right side  . IR [place] BX ASP/DRAIN  02/03/2017  . NM MYOCAR PERF WALL MOTION  09/07/2009   No ischemia; EF 51%  . SHOULDER SURGERY Left 08/2010  . SPINE SURGERY  2002   L5-S1     SOCIAL HISTORY:  Social History   Social History  . Marital status: Single    Spouse name: N/A  . Number of children: N/A  . Years of education: N/A   Occupational History  . Not on file.   Social History Main Topics  . Smoking status: Current Every Day Smoker    Packs/day: 1.00    Types: Cigarettes  . Smokeless tobacco: Never Used  . Alcohol use No  . Drug use: No  . Sexual activity: Not on file   Other Topics Concern  . Not on file   Social History Narrative  . No narrative on file    FAMILY HISTORY:  Family History  Problem Relation Age of Onset  . Heart attack Father   . Kidney disease Father  renal failure  . Heart failure Mother   . Heart attack Mother   . Cancer Brother   . Diabetes Brother   . Alcohol abuse Brother   . Diabetes Sister     CURRENT MEDICATIONS:  Outpatient Encounter Prescriptions as of 03/25/2017  Medication Sig  . ALPRAZolam (XANAX) 1 MG tablet Take 1 mg by mouth 4 (four) times daily.  Marland Kitchen atorvastatin (LIPITOR) 10 MG tablet TAKE 1 TABLET AT BEDTIME  (DISCONTINUE ATORVASTATIN '20MG'$ )  . CISPLATIN IV Inject into the vein. Day 1 every 21 days  . clotrimazole-betamethasone (LOTRISONE) cream Apply 1 application topically 2 (two) times daily.  . cyclobenzaprine (FLEXERIL) 10 MG tablet TAKE 1 TABLET THREE TIMES DAILY AS NEEDED FOR MUSCLE SPASMS.  Marland Kitchen dexamethasone (DECADRON) 4 MG tablet Take 2 tablets two times a day  for 1 day on day 4 after cisplatin chemotherapy. Take with food.  . ETOPOSIDE IV Inject into the vein. Days 1,2,3 every 21 days  . HYDROcodone-acetaminophen (NORCO) 5-325 MG tablet Take 0.5-1 tablets by mouth every 6 (six) hours as needed for moderate pain.  Marland Kitchen lidocaine-prilocaine (EMLA) cream Apply to affected area once  . LORazepam (ATIVAN) 0.5 MG tablet Take 1 tablet (0.5 mg total) by mouth every 6 (six) hours as needed (Nausea or vomiting).  . magnesium oxide (MAG-OX) 400 MG tablet Take 400 mg by mouth daily.  . ondansetron (ZOFRAN) 8 MG tablet Take 1 tablet (8 mg total) by mouth 2 (two) times daily as needed. Start on the third day after cisplatin chemotherapy.  . Pegfilgrastim (NEULASTA ONPRO [place]) Inject into the skin. Every 21 days  . polyethylene glycol powder (GLYCOLAX/MIRALAX) powder Take 17 g by mouth daily.  . prochlorperazine (COMPAZINE) 10 MG tablet Take 1 tablet (10 mg total) by mouth every 6 (six) hours as needed (Nausea or vomiting).  . traZODone (DESYREL) 100 MG tablet Take 400 mg by mouth at bedtime.  Marland Kitchen venlafaxine (EFFEXOR) 75 MG tablet Take 150 mg by mouth 2 (two) times daily.   . [DISCONTINUED] aspirin EC 81 MG tablet Take 81 mg by mouth at bedtime.  . [DISCONTINUED] co-enzyme Q-10 30 MG capsule Take 1 capsule (30 mg total) by mouth daily.   Facility-Administered Encounter Medications as of 03/25/2017  Medication  . [COMPLETED] 0.9 %  sodium chloride infusion  . etoposide (VEPESID) 190 mg in sodium chloride 0.9 % 500 mL chemo infusion  . pegfilgrastim (NEULASTA ONPRO KIT) injection 6 mg  . [DISCONTINUED] dexamethasone (DECADRON) 10 mg in sodium chloride 0.9 % 50 mL IVPB    ALLERGIES:  Allergies  Allergen Reactions  . Codeine Nausea Only  . Niaspan [Niacin Er]      PHYSICAL EXAM:  ECOG Performance status: 1 - Symptomatic; remains independent   RN vitals reviewed.   Physical Exam  Constitutional: He is oriented to person, place, and time.  Thin, pale male in no  acute distress   HENT:  Head: Normocephalic.  Mouth/Throat: Oropharynx is clear and moist. No oropharyngeal exudate.  Eyes: Pupils are equal, round, and reactive to light. Conjunctivae are normal. No scleral icterus.  Neck: Normal range of motion.  (R) supraclavicular with clinical improvement in size from previous. Now measuring about 1.5 cm near the right clavicle (previously 5-6+ cm).    Cardiovascular: Normal rate and regular rhythm.   Pulmonary/Chest: Effort normal and breath sounds normal. No respiratory distress. He has no wheezes.  Abdominal: Soft. Bowel sounds are normal. There is no tenderness. There is no rebound.  Musculoskeletal:  Normal range of motion. He exhibits no edema.  Neurological: He is alert and oriented to person, place, and time. No cranial nerve deficit. Gait normal.  Skin: Skin is warm. No pallor.  Psychiatric: Mood, memory, affect and judgment normal.  Nursing note and vitals reviewed.    LABORATORY DATA:  I have reviewed the labs as listed.  CBC    Component Value Date/Time   WBC 25.8 (H) 03/23/2017 0810   RBC 3.63 (L) 03/23/2017 0810   HGB 11.5 (L) 03/23/2017 0810   HCT 33.7 (L) 03/23/2017 0810   PLT 358 03/23/2017 0810   MCV 92.8 03/23/2017 0810   MCH 31.7 03/23/2017 0810   MCHC 34.1 03/23/2017 0810   RDW 18.7 (H) 03/23/2017 0810   LYMPHSABS 2.3 03/23/2017 0810   MONOABS 1.8 (H) 03/23/2017 0810   EOSABS 0.0 03/23/2017 0810   BASOSABS 0.0 03/23/2017 0810   CMP Latest Ref Rng & Units 03/23/2017 03/02/2017 02/19/2017  Glucose 65 - 99 mg/dL 110(H) 94 97  BUN 6 - 20 mg/dL 22(H) 23(H) 21(H)  Creatinine 0.61 - 1.24 mg/dL 0.80 0.89 0.93  Sodium 135 - 145 mmol/L 135 135 133(L)  Potassium 3.5 - 5.1 mmol/L 4.3 3.9 3.7  Chloride 101 - 111 mmol/L 99(L) 100(L) 101  CO2 22 - 32 mmol/L '27 29 26  '$ Calcium 8.9 - 10.3 mg/dL 9.1 8.9 9.0  Total Protein 6.5 - 8.1 g/dL 6.5 6.3(L) 6.7  Total Bilirubin 0.3 - 1.2 mg/dL 0.4 0.4 0.6  Alkaline Phos 38 - 126 U/L 67 55 64    AST 15 - 41 U/L '18 20 17  '$ ALT 17 - 63 U/L '20 22 23    '$ PENDING LABS:    DIAGNOSTIC IMAGING:  *The following radiologic images and reports have been reviewed independently and agree with below findings.  CT neck: 01/16/17     CT chest: 01/16/17    PET scan: 02/02/17    MRI brain: 02/11/17 CLINICAL DATA:  Advanced stage small cell lung cancer. Staging for metastatic disease  EXAM: MRI HEAD WITHOUT AND WITH CONTRAST  TECHNIQUE: Multiplanar, multiecho pulse sequences of the brain and surrounding structures were obtained without and with intravenous contrast.  CONTRAST:  21m MULTIHANCE GADOBENATE DIMEGLUMINE 529 MG/ML IV SOLN  COMPARISON:  None.  FINDINGS: Brain: Negative for hydrocephalus. Cerebral volume normal for age. Small nonenhancing white matter hyperintensities consistent with mild chronic microvascular ischemia. No acute infarct. Negative for hemorrhage or mass or edema  Normal enhancement postcontrast infusion. No enhancing mass lesion. Leptomeningeal enhancement is normal.  Vascular: Normal arterial flow voids.  Normal venous enhancement  Skull and upper cervical spine: Negative  Sinuses/Orbits: Negative  Other: None  IMPRESSION: Negative for metastatic disease.  No acute abnormality.  Mild chronic white matter changes.   Electronically Signed   By: CFranchot GalloM.D.   On: 02/11/2017 15:35    PATHOLOGY:  (R) neck lymph node biopsy: 02/03/17          ASSESSMENT & PLAN:   Extensive-stage small cell lung cancer with liver mets:  -Initially presented to his PCP in early 01/2017 with 3-week history of right neck mass.  CT chest performed on 01/16/17 revealed extensive lymphadenopathy in chest and lower right cervical lymph node area; also indeterminate lesion in liver.  CT neck also performed on 01/16/17 also showed bulky (R) supraclavicular malignant lymph node measuring 5.4 cm, with several abnormal level 3 & 4 cervical lymph  nodes and lymphadenopathy to superior mediastinum. IR attempted to place port-a-cath  for anticipated chemotherapy; they were unable to place port d/t enlarged SVC and aborted procedure d/t concerns for patient safety.  Biopsy of (R) neck mass revealed small cell carcinoma, most consistent with a lung primary.   PET scan does reveal hypermetabolic liver lesion, as well as multiple sites of hypermetabolic lymphadenopathy. He understands that his cancer is treatable, but not curable.   -Continue chemotherapy as planned. Plan for 6 cycles of cisplatin and etoposide. Plan to restage to assess for response to treatment with PET CT after cycle 3 of chemo, orders placed today. -Clinially he is responding well with significant decrease on physical exam of his right supraclavicular lymphadenopathy. -After he completes 6 cycles of chemo, he will return back to rad-onc for evaluation for PCI.   Tobacco use disorder:  -Continue efforts on smoke cessation.  Dispo: RTC in 3 weeks for follow up. PET CT prior to his next visit.  Orders Placed This Encounter  Procedures  . NM PET Image Restag (PS) Skull Base To Thigh    Standing Status:   Future    Standing Expiration Date:   03/25/2018    Order Specific Question:   If indicated for the ordered procedure, I authorize the administration of a radiopharmaceutical per Radiology protocol    Answer:   Yes    Order Specific Question:   Preferred imaging location?    Answer:   [facility]    Order Specific Question:   Radiology Contrast Protocol - do NOT remove file path    Answer:   [email or web address]      All questions were answered to patient's stated satisfaction. Encouraged patient to call with any new concerns or questions before his next visit to the cancer center and we can certain see him sooner, if needed.    Twana First, MD

## 2017-03-25 NOTE — Progress Notes (Signed)
For treatment today with no complaints voiced.  Family at side.   Patient tolerated chemotherapy with no complaints voiced.  Peripheral IV site clean and dry with no bruising or swelling noted.  Good blood return noted before and after chemotherapy. Band aid applied to site.    Neulasta Onpro applied to right arm with green indicator light working.  No complaints with site and no alarms sounding.    VSS stable with discharge and left ambulatory with family.  Reminded patient of time for neulasta to be administered with understanding verbalized.

## 2017-03-25 NOTE — Patient Instructions (Signed)
[facility] Discharge Instructions for Patients Receiving Chemotherapy  Today you received the following chemotherapy agents etoposide and Neulasta Onpro.    To help prevent nausea and vomiting after your treatment, we encourage you to take your nausea medication.     If you develop nausea and vomiting that is not controlled by your nausea medication, call the clinic.   BELOW ARE SYMPTOMS THAT SHOULD BE REPORTED IMMEDIATELY:  *FEVER GREATER THAN 100.5 F  *CHILLS WITH OR WITHOUT FEVER  NAUSEA AND VOMITING THAT IS NOT CONTROLLED WITH YOUR NAUSEA MEDICATION  *UNUSUAL SHORTNESS OF BREATH  *UNUSUAL BRUISING OR BLEEDING  TENDERNESS IN MOUTH AND THROAT WITH OR WITHOUT PRESENCE OF ULCERS  *URINARY PROBLEMS  *BOWEL PROBLEMS  UNUSUAL RASH Items with * indicate a potential emergency and should be followed up as soon as possible.  Feel free to call the clinic you have any questions or concerns. The clinic phone number is [phone].  Please show the [facility] at check-in to the Emergency Department and triage nurse.

## 2017-04-01 ENCOUNTER — Other Ambulatory Visit: Payer: Self-pay | Admitting: Radiology

## 2017-04-02 ENCOUNTER — Encounter (HOSPITAL_COMMUNITY): Payer: Self-pay

## 2017-04-02 ENCOUNTER — Ambulatory Visit (HOSPITAL_COMMUNITY)
Admission: RE | Admit: 2017-04-02 | Discharge: 2017-04-02 | Disposition: A | Discharge status: Home / Self Care | Payer: Medicare PPO | Source: Ambulatory Visit | Attending: Oncology | Admitting: Oncology

## 2017-04-02 ENCOUNTER — Other Ambulatory Visit (HOSPITAL_COMMUNITY): Payer: Self-pay | Admitting: Oncology

## 2017-04-02 DIAGNOSIS — F329 Major depressive disorder, single episode, unspecified: Secondary | ICD-10-CM | POA: Insufficient documentation

## 2017-04-02 DIAGNOSIS — C3490 Malignant neoplasm of unspecified part of unspecified bronchus or lung: Secondary | ICD-10-CM | POA: Diagnosis not present

## 2017-04-02 DIAGNOSIS — I2510 Atherosclerotic heart disease of native coronary artery without angina pectoris: Secondary | ICD-10-CM | POA: Diagnosis not present

## 2017-04-02 DIAGNOSIS — Z79899 Other long term (current) drug therapy: Secondary | ICD-10-CM | POA: Insufficient documentation

## 2017-04-02 DIAGNOSIS — F419 Anxiety disorder, unspecified: Secondary | ICD-10-CM | POA: Diagnosis not present

## 2017-04-02 DIAGNOSIS — Z951 Presence of aortocoronary bypass graft: Secondary | ICD-10-CM | POA: Insufficient documentation

## 2017-04-02 DIAGNOSIS — I252 Old myocardial infarction: Secondary | ICD-10-CM | POA: Diagnosis not present

## 2017-04-02 DIAGNOSIS — Z5111 Encounter for antineoplastic chemotherapy: Secondary | ICD-10-CM | POA: Diagnosis not present

## 2017-04-02 DIAGNOSIS — F17200 Nicotine dependence, unspecified, uncomplicated: Secondary | ICD-10-CM | POA: Insufficient documentation

## 2017-04-02 DIAGNOSIS — J449 Chronic obstructive pulmonary disease, unspecified: Secondary | ICD-10-CM | POA: Insufficient documentation

## 2017-04-02 HISTORY — PX: IR US GUIDE VASC ACCESS RIGHT: IMG2390

## 2017-04-02 HISTORY — PX: IR FLUORO GUIDE PORT INSERTION RIGHT: IMG5741

## 2017-04-02 LAB — CBC
HCT: 29.4 % — ABNORMAL LOW (ref 39.0–52.0)
Hemoglobin: 10.4 g/dL — ABNORMAL LOW (ref 13.0–17.0)
MCH: 32.1 pg (ref 26.0–34.0)
MCHC: 35.4 g/dL (ref 30.0–36.0)
MCV: 90.7 fL (ref 78.0–100.0)
Platelets: 145 10*3/uL — ABNORMAL LOW (ref 150–400)
RBC: 3.24 MIL/uL — ABNORMAL LOW (ref 4.22–5.81)
RDW: 18.6 % — ABNORMAL HIGH (ref 11.5–15.5)
WBC: 7.3 10*3/uL (ref 4.0–10.5)

## 2017-04-02 LAB — BASIC METABOLIC PANEL
Anion gap: 9 (ref 5–15)
BUN: 22 mg/dL — ABNORMAL HIGH (ref 6–20)
CO2: 25 mmol/L (ref 22–32)
Calcium: 9 mg/dL (ref 8.9–10.3)
Chloride: 103 mmol/L (ref 101–111)
Creatinine, Ser: 0.85 mg/dL (ref 0.61–1.24)
GFR calc Af Amer: >60 mL/min (ref >60)
GFR calc non Af Amer: >60 mL/min (ref >60)
Glucose, Bld: 87 mg/dL (ref 65–99)
Potassium: 3.7 mmol/L (ref 3.5–5.1)
Sodium: 137 mmol/L (ref 135–145)

## 2017-04-02 LAB — PROTIME-INR
INR: 1.03
Prothrombin Time: 13.4 seconds (ref 11.4–15.2)

## 2017-04-02 LAB — APTT: aPTT: 23 seconds — ABNORMAL LOW (ref 24–36)

## 2017-04-02 MED ORDER — SODIUM CHLORIDE 0.9 % IV SOLN
INTRAVENOUS | Status: DC
  Administered 2017-04-02: 08:00:00 via INTRAVENOUS

## 2017-04-02 MED ORDER — LIDOCAINE-EPINEPHRINE (PF) 2 %-1:200000 IJ SOLN
INTRAMUSCULAR | Status: AC | PRN
  Administered 2017-04-02: 20 mL

## 2017-04-02 MED ORDER — LIDOCAINE-EPINEPHRINE (PF) 2 %-1:200000 IJ SOLN
INTRAMUSCULAR | Status: AC
  Filled 2017-04-02: qty 20

## 2017-04-02 MED ORDER — HEPARIN SOD (PORK) LOCK FLUSH 100 UNIT/ML IV SOLN
INTRAVENOUS | Status: AC
  Filled 2017-04-02: qty 5

## 2017-04-02 MED ORDER — HEPARIN SOD (PORK) LOCK FLUSH 100 UNIT/ML IV SOLN
INTRAVENOUS | Status: AC | PRN
  Administered 2017-04-02: 500 [IU]

## 2017-04-02 MED ORDER — MIDAZOLAM HCL 2 MG/2ML IJ SOLN
INTRAMUSCULAR | Status: AC | PRN
  Administered 2017-04-02 (×2): 1 mg via INTRAVENOUS

## 2017-04-02 MED ORDER — CEFAZOLIN SODIUM-DEXTROSE 2-4 GM/100ML-% IV SOLN
INTRAVENOUS | Status: AC
  Filled 2017-04-02: qty 100

## 2017-04-02 MED ORDER — MIDAZOLAM HCL 2 MG/2ML IJ SOLN
INTRAMUSCULAR | Status: AC
  Filled 2017-04-02: qty 6

## 2017-04-02 MED ORDER — CEFAZOLIN SODIUM-DEXTROSE 2-4 GM/100ML-% IV SOLN
2.0000 g | INTRAVENOUS | Status: AC
  Administered 2017-04-02: 2 g via INTRAVENOUS

## 2017-04-02 MED ORDER — FENTANYL CITRATE (PF) 100 MCG/2ML IJ SOLN
INTRAMUSCULAR | Status: AC | PRN
  Administered 2017-04-02 (×2): 50 ug via INTRAVENOUS

## 2017-04-02 MED ORDER — FENTANYL CITRATE (PF) 100 MCG/2ML IJ SOLN
INTRAMUSCULAR | Status: AC
  Filled 2017-04-02: qty 6

## 2017-04-02 NOTE — Procedures (Signed)
  Procedure:   R IJ Port   Preprocedure diagnosis:  Small cell CA Postprocedure diagnosis:  same EBL:     minimal Complications:   none immediate  See full dictation in [facility].  Dillard Cannon MD Main # [phone] Pager  [phone]

## 2017-04-02 NOTE — Discharge Instructions (Signed)
Moderate Conscious Sedation, Adult, Care After °These instructions provide you with information about caring for yourself after your procedure. Your health care provider may also give you more specific instructions. Your treatment has been planned according to current medical practices, but problems sometimes occur. Call your health care provider if you have any problems or questions after your procedure. °What can I expect after the procedure? °After your procedure, it is common: °· To feel sleepy for several hours. °· To feel clumsy and have poor balance for several hours. °· To have poor judgment for several hours. °· To vomit if you eat too soon. ° °Follow these instructions at home: °For at least 24 hours after the procedure: ° °· Do not: °? Participate in activities where you could fall or become injured. °? Drive. °? Use heavy machinery. °? Drink alcohol. °? Take sleeping pills or medicines that cause drowsiness. °? Make important decisions or sign legal documents. °? Take care of children on your own. °· Rest. °Eating and drinking °· Follow the diet recommended by your health care provider. °· If you vomit: °? Drink water, juice, or soup when you can drink without vomiting. °? Make sure you have little or no nausea before eating solid foods. °General instructions °· Have a responsible adult stay with you until you are awake and alert. °· Take over-the-counter and prescription medicines only as told by your health care provider. °· If you smoke, do not smoke without supervision. °· Keep all follow-up visits as told by your health care provider. This is important. °Contact a health care provider if: °· You keep feeling nauseous or you keep vomiting. °· You feel light-headed. °· You develop a rash. °· You have a fever. °Get help right away if: °· You have trouble breathing. °This information is not intended to replace advice given to you by your health care provider. Make sure you discuss any questions you have  with your health care provider. °Document Released: 04/20/2013 Document Revised: 12/03/2015 Document Reviewed: 10/20/2015 °Elsevier Interactive Patient Education © 2018 [employer] ° ° °Implanted Port Home Guide °An implanted port is a type of central line that is placed under the skin. Central lines are used to provide IV access when treatment or nutrition needs to be given through a person’s veins. Implanted ports are used for long-term IV access. An implanted port may be placed because: °· You need IV medicine that would be irritating to the small veins in your hands or arms. °· You need long-term IV medicines, such as antibiotics. °· You need IV nutrition for a long period. °· You need frequent blood draws for lab tests. °· You need dialysis. ° °Implanted ports are usually placed in the chest area, but they can also be placed in the upper arm, the abdomen, or the leg. An implanted port has two main parts: °· Reservoir. The reservoir is round and will appear as a small, raised area under your skin. The reservoir is the part where a needle is inserted to give medicines or draw blood. °· Catheter. The catheter is a thin, flexible tube that extends from the reservoir. The catheter is placed into a large vein. Medicine that is inserted into the reservoir goes into the catheter and then into the vein. ° °How will I care for my incision site? °Do not get the incision site wet. Bathe or shower as directed by your health care provider. °How is my port accessed? °Special steps must be taken to access the   port:  Before the port is accessed, a numbing cream can be placed on the skin. This helps numb the skin over the port site.  Your health care provider uses a sterile technique to access the port. ? Your health care provider must put on a mask and sterile gloves. ? The skin over your port is cleaned carefully with an antiseptic and allowed to dry. ? The port is gently pinched between sterile gloves, and a needle  is inserted into the port.  Only "non-coring" port needles should be used to access the port. Once the port is accessed, a blood return should be checked. This helps ensure that the port is in the vein and is not clogged.  If your port needs to remain accessed for a constant infusion, a clear (transparent) bandage will be placed over the needle site. The bandage and needle will need to be changed every week, or as directed by your health care provider.  Keep the bandage covering the needle clean and dry. Do not get it wet. Follow your health care providers instructions on how to take a shower or bath while the port is accessed.  If your port does not need to stay accessed, no bandage is needed over the port.  What is flushing? Flushing helps keep the port from getting clogged. Follow your health care providers instructions on how and when to flush the port. Ports are usually flushed with saline solution or a medicine called heparin. The need for flushing will depend on how the port is used.  If the port is used for intermittent medicines or blood draws, the port will need to be flushed: ? After medicines have been given. ? After blood has been drawn. ? As part of routine maintenance.  If a constant infusion is running, the port may not need to be flushed.  How long will my port stay implanted? The port can stay in for as long as your health care provider thinks it is needed. When it is time for the port to come out, surgery will be done to remove it. The procedure is similar to the one performed when the port was put in. When should I seek immediate medical care? When you have an implanted port, you should seek immediate medical care if:  You notice a bad smell coming from the incision site.  You have swelling, redness, or drainage at the incision site.  You have more swelling or pain at the port site or the surrounding area.  You have a fever that is not controlled with  medicine.  This information is not intended to replace advice given to you by your health care provider. Make sure you discuss any questions you have with your health care provider. Document Released: 06/30/2005 Document Revised: 12/06/2015 Document Reviewed: 03/07/2013 Elsevier Interactive Patient Education  2017 [employer], Care After This sheet gives you information about how to care for yourself after your procedure. Your health care provider may also give you more specific instructions. If you have problems or questions, contact your health care provider. What can I expect after the procedure? After your procedure, it is common to have:  Discomfort at the port insertion site.  Bruising on the skin over the port. This should improve over 3-4 days.  Follow these instructions at home: [facility] care  After your port is placed, you will get a manufacturer's information card. The card has information about your port. Keep this card  with you at all times.  Take care of the port as told by your health care provider. Ask your health care provider if you or a family member can get training for taking care of the port at home. A home health care nurse may also take care of the port.  Make sure to remember what type of port you have. Incision care  Follow instructions from your health care provider about how to take care of your port insertion site. Make sure you: ? Wash your hands with soap and water before you change your bandage (dressing). If soap and water are not available, use hand sanitizer. ? Change your dressing as told by your health care provider.  You may remove your dressing tomorrow 04/03/17. ? Leave skin glue in place. These skin closures may need to stay in place for 2 weeks or longer.  Do not remove unless your health care provider tells you to do that.  Check your port insertion site every day for signs of infection.   Check for: ? More  redness, swelling, or pain. ? More fluid or blood. ? Warmth. ? Pus or a bad smell. General instructions  Do not take baths, swim, or use a hot tub until your health care provider approves.  YOU MAY SHOWER TOMORROW 04/03/17  Do not lift anything that is heavier than 10 lb (4.5 kg) for a week, or as told by your health care provider.  Ask your health care provider when it is okay to: ? Return to work or school. ? Resume usual physical activities or sports.  Do not drive for 24 hours if you were given a medicine to help you relax (sedative).  Take over-the-counter and prescription medicines only as told by your health care provider.  Wear a medical alert bracelet in case of an emergency. This will tell any health care providers that you have a port.  Keep all follow-up visits as told by your health care provider. This is important. Contact a health care provider if:  You have a fever or chills.  You have more redness, swelling, or pain around your port insertion site.  You have more fluid or blood coming from your port insertion site.  Your port insertion site feels warm to the touch.  You have pus or a bad smell coming from the port insertion site. Get help right away if:  You have chest pain or shortness of breath.  You have bleeding from your port that you cannot control. Summary  Take care of the port as told by your health care provider.  Change your dressing as told by your health care provider.  Keep all follow-up visits as told by your health care provider. This information is not intended to replace advice given to you by your health care provider. Make sure you discuss any questions you have with your health care provider. Document Released: 04/20/2013 Document Revised: 05/21/2016 Document Reviewed: 05/21/2016 Elsevier Interactive Patient Education  2017 [employer].

## 2017-04-02 NOTE — H&P (Signed)
Referring Physician(s): Zhou,Louise  Supervising Physician: Arne Cleveland  Patient Status:  [facility]  Chief Complaint:  "I'm getting a port a cath"  Subjective: Patient familiar to IR service from prior right neck lymph node biopsy on 02/03/17. He has a history of metastatic small cell lung carcinoma and presents today for Port-A-Cath placement for chemotherapy. He was initially scheduled for port placement on 7/24 however due to SVC compression procedure was aborted. He has since undergone few chemotherapy sessions. He currently denies fever, headache, chest pain, worsening dyspnea, abdominal pain, nausea, vomiting or abnormal bleeding. He does have occasional cough, intermittent back pain. He continues to smoke. Past Medical History:  Diagnosis Date  . Anxiety   . CAD (coronary artery disease)   . COPD (chronic obstructive pulmonary disease) ([place])   . Depression   . Myocardial infarction ([place])   . Osteopenia   . Panic attacks   . Smoker    Past Surgical History:  Procedure Laterality Date  . BACK SURGERY  12/24/2000   L5,S1  . CORONARY STENT PLACEMENT  2005   RCA & CX  . HERNIA REPAIR Right 1980's  . INGUINAL HERNIA REPAIR  12/1978   right side  . IR US GUIDE BX ASP/DRAIN  02/03/2017  . NM MYOCAR PERF WALL MOTION  09/07/2009   No ischemia; EF 51%  . SHOULDER SURGERY Left 08/2010  . SPINE SURGERY  2002   L5-S1      Allergies: Codeine and Niaspan [niacin er]  Medications: Prior to Admission medications   Medication Sig Start Date End Date Taking? Authorizing Provider  ALPRAZolam Duanne Moron) 1 MG tablet Take 1 mg by mouth 4 (four) times daily.   Yes [provider]  atorvastatin (LIPITOR) 10 MG tablet TAKE 1 TABLET AT BEDTIME  (DISCONTINUE ATORVASTATIN 20MG ) 06/20/16  Yes Croitoru, Mihai, MD  CISPLATIN IV Inject into the vein. Day 1 every 21 days   Yes [provider]  clotrimazole-betamethasone (LOTRISONE) cream Apply 1 application topically 2 (two)  times daily. 03/18/16  Yes [name], MD  cyclobenzaprine (FLEXERIL) 10 MG tablet TAKE 1 TABLET THREE TIMES DAILY AS NEEDED FOR MUSCLE SPASMS. 09/15/16  Yes [name], MD  dexamethasone (DECADRON) 4 MG tablet Take 2 tablets two times a day for 1 day on day 4 after cisplatin chemotherapy. Take with food. 02/09/17  Yes Twana First, MD  ETOPOSIDE IV Inject into the vein. Days 1,2,3 every 21 days   Yes [provider]  HYDROcodone-acetaminophen (NORCO) 5-325 MG tablet Take 0.5-1 tablets by mouth every 6 (six) hours as needed for moderate pain. 02/19/17  Yes Holley Bouche, NP  LORazepam (ATIVAN) 0.5 MG tablet Take 1 tablet (0.5 mg total) by mouth every 6 (six) hours as needed (Nausea or vomiting). 02/05/17  Yes Twana First, MD  ondansetron (ZOFRAN) 8 MG tablet Take 1 tablet (8 mg total) by mouth 2 (two) times daily as needed. Start on the third day after cisplatin chemotherapy. 02/09/17  Yes Twana First, MD  Pegfilgrastim (NEULASTA ONPRO [place]) Inject into the skin. Every 21 days   Yes [provider]  polyethylene glycol powder (GLYCOLAX/MIRALAX) powder Take 17 g by mouth daily. 02/09/17  Yes Twana First, MD  prochlorperazine (COMPAZINE) 10 MG tablet Take 1 tablet (10 mg total) by mouth every 6 (six) hours as needed (Nausea or vomiting). 02/09/17  Yes Twana First, MD  traZODone (DESYREL) 100 MG tablet Take 400 mg by mouth at bedtime. 12/28/13  Yes [provider]  venlafaxine (EFFEXOR) 75 MG tablet Take 150 mg by mouth 2 (two) times daily.    Yes [provider]  lidocaine-prilocaine (EMLA) cream Apply to affected area once 02/09/17   Twana First, MD  magnesium oxide (MAG-OX) 400 MG tablet Take 400 mg by mouth daily.    [provider]     Vital Signs: BP 123/67 (BP Location: Right Arm)   Pulse 64   Temp 97.8 F (36.6 C) (Oral)   Resp 16   SpO2 98%   Physical Exam awake, alert. Chest with few scatt rhonchi. Heart with regular rate and  rhythm. Abdomen soft, positive bowel sounds, nontender. No lower extremity edema.  Imaging: No results found.  Labs:  CBC:  Recent Labs  02/19/17 0847 03/02/17 0846 03/23/17 0810 04/02/17 0753  WBC 5.8 22.3* 25.8* 7.3  HGB 12.8* 13.6 11.5* 10.4*  HCT 37.3* 39.2 33.7* 29.4*  PLT 67* 298 358 145*    COAGS:  Recent Labs  02/03/17 1114 04/02/17 0753  INR 1.11 1.03  APTT 30 23*    BMP:  Recent Labs  02/19/17 0847 03/02/17 0846 03/23/17 0810 04/02/17 0753  NA 133* 135 135 137  K 3.7 3.9 4.3 3.7  CL 101 100* 99* 103  CO2 26 29 27 25   GLUCOSE 97 94 110* 87  BUN 21* 23* 22* 22*  CALCIUM 9.0 8.9 9.1 9.0  CREATININE 0.93 0.89 0.80 0.85  GFRNONAA >60 >60 >60 >60  GFRAA >60 >60 >60 >60    LIVER FUNCTION TESTS:  Recent Labs  02/09/17 0917 02/19/17 0847 03/02/17 0846 03/23/17 0810  BILITOT 0.5 0.6 0.4 0.4  AST 19 17 20 18   ALT 11* 23 22 20   ALKPHOS 67 64 55 67  PROT 6.7 6.7 6.3* 6.5  ALBUMIN 3.4* 3.8 3.6 3.5    Assessment and Plan: Pt with history of metastatic small cell lung carcinoma who presents today for Port-A-Cath/central venous catheter placement for chemotherapy. He was initially scheduled for port placement on 02/03/17, however due to SVC compression procedure was aborted. He has since undergone few chemotherapy sessions.Risks and benefits discussed with the patient/brother including, but not limited to bleeding, infection, pneumothorax, or fibrin sheath development and need for additional procedures.All of the patient's questions were answered, patient is agreeable to proceed.Consent signed and in chart.     Electronically Signed: D. Rowe Robert, PA-C 04/02/2017, 8:40 AM   I spent a total of 20 minutes at the the patient's bedside AND on the patient's hospital floor or unit, greater than 50% of which was counseling/coordinating care for Port-A-Cath placement

## 2017-04-03 ENCOUNTER — Encounter (HOSPITAL_COMMUNITY): Payer: Self-pay | Admitting: Interventional Radiology

## 2017-04-03 ENCOUNTER — Other Ambulatory Visit (HOSPITAL_COMMUNITY): Payer: Self-pay

## 2017-04-03 DIAGNOSIS — C3490 Malignant neoplasm of unspecified part of unspecified bronchus or lung: Secondary | ICD-10-CM

## 2017-04-03 DIAGNOSIS — G893 Neoplasm related pain (acute) (chronic): Secondary | ICD-10-CM

## 2017-04-03 MED ORDER — HYDROCODONE-ACETAMINOPHEN 5-325 MG PO TABS: 0.5000 | ORAL_TABLET | Freq: Four times a day (QID) | ORAL | 0 refills | Status: DC | PRN

## 2017-04-03 NOTE — Telephone Encounter (Signed)
Received refill request from patients pharmacy for hydrocodone. Reviewed with provider, chart checked and refilled.

## 2017-04-06 DIAGNOSIS — F3342 Major depressive disorder, recurrent, in full remission: Secondary | ICD-10-CM | POA: Diagnosis not present

## 2017-04-07 ENCOUNTER — Ambulatory Visit (HOSPITAL_COMMUNITY)
Admission: RE | Admit: 2017-04-07 | Discharge: 2017-04-07 | Disposition: A | Discharge status: Home / Self Care | Payer: Medicare PPO | Source: Ambulatory Visit | Attending: Oncology | Admitting: Oncology

## 2017-04-07 DIAGNOSIS — C3490 Malignant neoplasm of unspecified part of unspecified bronchus or lung: Secondary | ICD-10-CM | POA: Insufficient documentation

## 2017-04-07 DIAGNOSIS — C787 Secondary malignant neoplasm of liver and intrahepatic bile duct: Secondary | ICD-10-CM | POA: Diagnosis not present

## 2017-04-07 DIAGNOSIS — R590 Localized enlarged lymph nodes: Secondary | ICD-10-CM | POA: Diagnosis not present

## 2017-04-07 DIAGNOSIS — Z9221 Personal history of antineoplastic chemotherapy: Secondary | ICD-10-CM | POA: Diagnosis not present

## 2017-04-07 LAB — GLUCOSE, CAPILLARY: Glucose-Capillary: 92 mg/dL (ref 65–99)

## 2017-04-07 MED ORDER — FLUDEOXYGLUCOSE F - 18 (FDG) INJECTION
9.3400 | Freq: Once | INTRAVENOUS | Status: AC | PRN
  Administered 2017-04-07: 9.34 via INTRAVENOUS

## 2017-04-13 ENCOUNTER — Ambulatory Visit (HOSPITAL_COMMUNITY): Payer: Medicare PPO

## 2017-04-13 ENCOUNTER — Encounter (HOSPITAL_BASED_OUTPATIENT_CLINIC_OR_DEPARTMENT_OTHER): Payer: Medicare PPO | Admitting: Oncology

## 2017-04-13 ENCOUNTER — Encounter (HOSPITAL_COMMUNITY): Payer: Medicare PPO | Attending: Oncology

## 2017-04-13 ENCOUNTER — Encounter (HOSPITAL_COMMUNITY): Payer: Self-pay | Admitting: Oncology

## 2017-04-13 VITALS — BP 117/60 | HR 68 | Temp 97.7°F | Resp 16 | Wt 169.0 lb

## 2017-04-13 DIAGNOSIS — C787 Secondary malignant neoplasm of liver and intrahepatic bile duct: Secondary | ICD-10-CM

## 2017-04-13 DIAGNOSIS — Z5111 Encounter for antineoplastic chemotherapy: Secondary | ICD-10-CM | POA: Diagnosis not present

## 2017-04-13 DIAGNOSIS — C3490 Malignant neoplasm of unspecified part of unspecified bronchus or lung: Secondary | ICD-10-CM

## 2017-04-13 DIAGNOSIS — C770 Secondary and unspecified malignant neoplasm of lymph nodes of head, face and neck: Secondary | ICD-10-CM | POA: Diagnosis not present

## 2017-04-13 DIAGNOSIS — K5900 Constipation, unspecified: Secondary | ICD-10-CM | POA: Diagnosis not present

## 2017-04-13 LAB — COMPREHENSIVE METABOLIC PANEL
ALT: 22 U/L (ref 17–63)
AST: 16 U/L (ref 15–41)
Albumin: 3.4 g/dL — ABNORMAL LOW (ref 3.5–5.0)
Alkaline Phosphatase: 62 U/L (ref 38–126)
Anion gap: 6 (ref 5–15)
BUN: 23 mg/dL — ABNORMAL HIGH (ref 6–20)
CO2: 28 mmol/L (ref 22–32)
Calcium: 8.8 mg/dL — ABNORMAL LOW (ref 8.9–10.3)
Chloride: 99 mmol/L — ABNORMAL LOW (ref 101–111)
Creatinine, Ser: 0.94 mg/dL (ref 0.61–1.24)
GFR calc Af Amer: >60 mL/min (ref >60)
GFR calc non Af Amer: >60 mL/min (ref >60)
Glucose, Bld: 93 mg/dL (ref 65–99)
Potassium: 4 mmol/L (ref 3.5–5.1)
Sodium: 133 mmol/L — ABNORMAL LOW (ref 135–145)
Total Bilirubin: 0.5 mg/dL (ref 0.3–1.2)
Total Protein: 6.2 g/dL — ABNORMAL LOW (ref 6.5–8.1)

## 2017-04-13 LAB — CBC WITH DIFFERENTIAL/PLATELET
Basophils Absolute: 0 10*3/uL (ref 0.0–0.1)
Basophils Relative: 0 %
Eosinophils Absolute: 0 10*3/uL (ref 0.0–0.7)
Eosinophils Relative: 0 %
HCT: 37.2 % — ABNORMAL LOW (ref 39.0–52.0)
Hemoglobin: 12.5 g/dL — ABNORMAL LOW (ref 13.0–17.0)
Lymphocytes Relative: 28 %
Lymphs Abs: 2.6 10*3/uL (ref 0.7–4.0)
MCH: 32.3 pg (ref 26.0–34.0)
MCHC: 33.6 g/dL (ref 30.0–36.0)
MCV: 96.1 fL (ref 78.0–100.0)
Monocytes Absolute: 1.8 10*3/uL — ABNORMAL HIGH (ref 0.1–1.0)
Monocytes Relative: 19 %
Neutro Abs: 5 10*3/uL (ref 1.7–7.7)
Neutrophils Relative %: 53 %
Platelets: 210 10*3/uL (ref 150–400)
RBC: 3.87 MIL/uL — ABNORMAL LOW (ref 4.22–5.81)
RDW: 21.4 % — ABNORMAL HIGH (ref 11.5–15.5)
WBC: 9.4 10*3/uL (ref 4.0–10.5)

## 2017-04-13 LAB — MAGNESIUM: Magnesium: 1.9 mg/dL (ref 1.7–2.4)

## 2017-04-13 MED ORDER — SODIUM CHLORIDE 0.9 % IV SOLN
80.0000 mg/m2 | Freq: Once | INTRAVENOUS | Status: AC
  Administered 2017-04-13: 153 mg via INTRAVENOUS
  Filled 2017-04-13: qty 153

## 2017-04-13 MED ORDER — POTASSIUM CHLORIDE 2 MEQ/ML IV SOLN
Freq: Once | INTRAVENOUS | Status: AC
  Administered 2017-04-13: 10:00:00 via INTRAVENOUS
  Filled 2017-04-13: qty 10

## 2017-04-13 MED ORDER — SODIUM CHLORIDE 0.9 % IV SOLN
Freq: Once | INTRAVENOUS | Status: AC
  Administered 2017-04-13: 12:00:00 via INTRAVENOUS
  Filled 2017-04-13: qty 5

## 2017-04-13 MED ORDER — PALONOSETRON HCL INJECTION 0.25 MG/5ML
0.2500 mg | Freq: Once | INTRAVENOUS | Status: AC
  Administered 2017-04-13: 0.25 mg via INTRAVENOUS
  Filled 2017-04-13: qty 5

## 2017-04-13 MED ORDER — SODIUM CHLORIDE 0.9 % IV SOLN
100.0000 mg/m2 | Freq: Once | INTRAVENOUS | Status: AC
  Administered 2017-04-13: 190 mg via INTRAVENOUS
  Filled 2017-04-13: qty 9.5

## 2017-04-13 MED ORDER — SODIUM CHLORIDE 0.9 % IV SOLN
Freq: Once | INTRAVENOUS | Status: AC
  Administered 2017-04-13: 12:00:00 via INTRAVENOUS

## 2017-04-13 NOTE — Progress Notes (Signed)
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 Bulky 5.4 cm right supraclavicular region malignant lymph node conglomeration with extracapsular extension. 2. Surrounding smaller abnormal right level 3 and level 5 lymph nodes, and the lymphadenopathy continues into the superior mediastinum, see Chest CT findings reported separately. 3. No other metastatic disease identified in the neck.      01/16/2017 Imaging    CT chest: IMPRESSION: 1. Extensive lymphadenopathy in the thorax and lower right cervical region, as discussed above. Primary differential considerations include lymphoma/leukemia or small cell carcinoma of the lung. Further evaluation a PET-CT could be considered to assess for additional sites of disease below the diaphragm if clinically appropriate. Additionally, ultrasound-guided biopsy of supraclavicular lymphadenopathy could be considered to establish a tissue diagnosis. 2. Indeterminate lesion in the periphery of segment 8 of the liver measuring 2.7 x 1.7 cm. Attention at time of follow-up PET-CT is recommended. 3. Aortic atherosclerosis, in addition to left main and 3 vessel coronary artery disease. Please note that although the presence of coronary artery calcium documents the presence of coronary artery disease, the severity of this disease and any potential stenosis cannot be assessed on this non-gated CT examination. Assessment for potential risk factor modification, dietary therapy or pharmacologic therapy may be  warranted, if clinically indicated. 4. There are calcifications of the aortic valve. Echocardiographic correlation for evaluation of potential valvular dysfunction may be warranted if clinically indicated. 5. Diffuse bronchial wall thickening with moderate centrilobular and paraseptal emphysema; imaging findings suggestive of underlying COPD.      02/03/2017 Initial Biopsy    (R) neck lymph node biopsy: SMALL CELL CARCINOMA (most likely lung primary).       02/03/2017 Miscellaneous    Port-a-cath attempted by IR; unable to place d/t enlarged SVC.       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 ([name])     02/09/2017 -  Chemotherapy    The patient had palonosetron (ALOXI) injection 0.25 mg, 0.25 mg, Intravenous,  Once, 1 of 4 cycles  pegfilgrastim (NEULASTA) injection 6 mg, 6 mg, Subcutaneous, Once, 1 of 1 cycle  pegfilgrastim (NEULASTA ONPRO KIT) injection 6 mg, 6 mg, Subcutaneous, Once, 0 of 3 cycles  CISplatin (PLATINOL) 153 mg in sodium chloride 0.9 % 500 mL chemo infusion, 80 mg/m2 = 153 mg, Intravenous,  Once, 1 of 4 cycles  etoposide (VEPESID) 190 mg in sodium chloride 0.9 % 500 mL chemo infusion, 100 mg/m2 = 190 mg, Intravenous,  Once, 1 of 4 cycles  fosaprepitant (EMEND) 150 mg, dexamethasone (DECADRON) 12 mg in sodium chloride 0.9 % 145 mL IVPB, , Intravenous,  Once, 1 of 4 cycles  for chemotherapy treatment.        02/11/2017 Imaging    MRI brain: CLINICAL DATA:  Advanced stage small cell lung cancer. Staging for metastatic disease  EXAM: MRI HEAD WITHOUT AND WITH CONTRAST  TECHNIQUE: Multiplanar, multiecho pulse sequences of the brain and surrounding structures were obtained without and with intravenous contrast.  CONTRAST:  26m MULTIHANCE GADOBENATE DIMEGLUMINE 529 MG/ML IV SOLN  COMPARISON:  None.  FINDINGS: Brain: Negative for hydrocephalus. Cerebral volume normal for age. Small nonenhancing white matter  hyperintensities consistent with mild chronic microvascular ischemia. No acute infarct. Negative for hemorrhage or mass or edema  Normal enhancement postcontrast infusion. No enhancing mass lesion. Leptomeningeal enhancement is normal.  Vascular: Normal arterial flow voids.  Normal venous enhancement  Skull and upper cervical spine: Negative  Sinuses/Orbits: Negative  Other: None  IMPRESSION: Negative for metastatic disease.  No acute abnormality.  Mild chronic white matter changes.         INTERVAL HISTORY:  [name]89y.o. male returns to cancer center for follow-up for extensive stage small cell lung cancer.   He is here for cycle 3 day 3 of chemotherapy with cisplatin and etoposide. He's been tolerating chemotherapy well. He denies any fatigue. He states his energy level is really good. His appetite is good and he is gaining weight. He denies any SOB, CP, abdominal pain, focal weakness, headaches, nausea, vomiting, diarrhea. He recently had one episode of dizziness but then found out his BP was running a little low. He has some chronic constipation but he states the miralax is helping. He continues to smoke 1ppd. April 13, 2017 Patient is here for ongoing evaluation and continuation of chemotherapy.. He had a PET scan done which shows significant response.  PET scan has been reviewed independently.  Liver mass has decreased in size.  Patient does not have any significant nausea vomiting diarrhea or any numbness or no significant side effects  REVIEW OF SYSTEMS:  Review of Systems  Constitutional: Negative for chills, fatigue and fever.  HENT:  Negative.   Eyes: Negative.   Respiratory: Negative.  Negative for cough and shortness of breath.   Cardiovascular: Negative.  Negative for leg swelling.  Gastrointestinal: Positive for constipation. Negative for abdominal pain, blood in stool, diarrhea, nausea and vomiting.  Endocrine: Negative.   Musculoskeletal:  Negative for arthralgias, back pain and neck pain.  Neurological: Negative for dizziness and headaches.  Hematological: Positive for adenopathy.  Psychiatric/Behavioral: Negative for sleep disturbance.     PAST MEDICAL/SURGICAL HISTORY:  Past Medical History:  Diagnosis Date  . Anxiety   . CAD (coronary artery disease)   . COPD (chronic obstructive pulmonary disease) ([place]   . Depression   . Myocardial infarction ([place]   . Osteopenia   . Panic attacks   . Smoker    Past Surgical History:  Procedure Laterality Date  . BACK SURGERY  12/24/2000   L5,S1  . CORONARY STENT PLACEMENT  2005   RCA & CX  . HERNIA REPAIR Right 1980's  . INGUINAL HERNIA REPAIR  12/1978   right side  . IR FLUORO GUIDE PORT INSERTION RIGHT  04/02/2017  . IR [place] BX ASP/DRAIN  02/03/2017  . IR [place] VASC ACCESS RIGHT  04/02/2017  . NM MYOCAR PERF WALL MOTION  09/07/2009   No ischemia; EF 51%  . SHOULDER SURGERY Left 08/2010  . SPINE SURGERY  2002   L5-S1     SOCIAL HISTORY:  Social History   Social History  . Marital status: Single    Spouse name: N/A  . Number of children: N/A  . Years of education: N/A   Occupational History  . Not on file.   Social History Main Topics  . Smoking status: Current Every Day Smoker    Packs/day: 1.00    Types: Cigarettes  .  Smokeless tobacco: Never Used  . Alcohol use No  . Drug use: No  . Sexual activity: Not on file   Other Topics Concern  . Not on file   Social History Narrative  . No narrative on file    FAMILY HISTORY:  Family History  Problem Relation Age of Onset  . Heart attack Father   . Kidney disease Father        renal failure  . Heart failure Mother   . Heart attack Mother   . Cancer Brother   . Diabetes Brother   . Alcohol abuse Brother   . Diabetes Sister     CURRENT MEDICATIONS:  Outpatient Encounter Prescriptions as of 04/13/2017  Medication Sig  . ALPRAZolam (XANAX) 1 MG tablet Take 1 mg by mouth 4 (four) times  daily.  Marland Kitchen atorvastatin (LIPITOR) 10 MG tablet TAKE 1 TABLET AT BEDTIME  (DISCONTINUE ATORVASTATIN '20MG'$ )  . CISPLATIN IV Inject into the vein. Day 1 every 21 days  . clotrimazole-betamethasone (LOTRISONE) cream Apply 1 application topically 2 (two) times daily.  . cyclobenzaprine (FLEXERIL) 10 MG tablet TAKE 1 TABLET THREE TIMES DAILY AS NEEDED FOR MUSCLE SPASMS.  Marland Kitchen dexamethasone (DECADRON) 4 MG tablet Take 2 tablets two times a day for 1 day on day 4 after cisplatin chemotherapy. Take with food.  . ETOPOSIDE IV Inject into the vein. Days 1,2,3 every 21 days  . HYDROcodone-acetaminophen (NORCO) 5-325 MG tablet Take 0.5-1 tablets by mouth every 6 (six) hours as needed for moderate pain.  Marland Kitchen lidocaine-prilocaine (EMLA) cream Apply to affected area once  . LORazepam (ATIVAN) 0.5 MG tablet Take 1 tablet (0.5 mg total) by mouth every 6 (six) hours as needed (Nausea or vomiting).  . magnesium oxide (MAG-OX) 400 MG tablet Take 400 mg by mouth daily.  . ondansetron (ZOFRAN) 8 MG tablet Take 1 tablet (8 mg total) by mouth 2 (two) times daily as needed. Start on the third day after cisplatin chemotherapy.  . Pegfilgrastim (NEULASTA ONPRO [place]) Inject into the skin. Every 21 days  . polyethylene glycol powder (GLYCOLAX/MIRALAX) powder Take 17 g by mouth daily.  . prochlorperazine (COMPAZINE) 10 MG tablet Take 1 tablet (10 mg total) by mouth every 6 (six) hours as needed (Nausea or vomiting).  . traZODone (DESYREL) 100 MG tablet Take 400 mg by mouth at bedtime.  Marland Kitchen venlafaxine (EFFEXOR) 75 MG tablet Take 150 mg by mouth 2 (two) times daily.   . [EXPIRED] dextrose 5 % and 0.45% NaCl 1,000 mL with potassium chloride 20 mEq, magnesium sulfate 12 mEq, mannitol 12.5 g infusion    No facility-administered encounter medications on file as of 04/13/2017.     ALLERGIES:  Allergies  Allergen Reactions  . Codeine Nausea Only  . Niaspan [Niacin Er]      PHYSICAL EXAM:  ECOG Performance status: 1 - Symptomatic;  remains independent   RN vitals reviewed.   Physical Exam  Constitutional: He is oriented to person, place, and time.  Thin, pale male in no acute distress   HENT:  Head: Normocephalic.  Mouth/Throat: Oropharynx is clear and moist. No oropharyngeal exudate.  Eyes: Pupils are equal, round, and reactive to light. Conjunctivae are normal. No scleral icterus.  Neck: Normal range of motion.  (R) supraclavicular with clinical improvement in size from previous. Now measuring about 1.5 cm near the right clavicle (previously 5-6+ cm).    Cardiovascular: Normal rate and regular rhythm.   Pulmonary/Chest: Effort normal and breath sounds normal. No respiratory distress.  He has no wheezes.  Abdominal: Soft. Bowel sounds are normal. There is no tenderness. There is no rebound.  Musculoskeletal: Normal range of motion. He exhibits no edema.  Neurological: He is alert and oriented to person, place, and time. No cranial nerve deficit. Gait normal.  Skin: Skin is warm. No pallor.  Psychiatric: Mood, memory, affect and judgment normal.  Nursing note and vitals reviewed.    LABORATORY DATA:  I have reviewed the labs as listed.  CBC    Component Value Date/Time   WBC 9.4 04/13/2017 0907   RBC 3.87 (L) 04/13/2017 0907   HGB 12.5 (L) 04/13/2017 0907   HCT 37.2 (L) 04/13/2017 0907   PLT 210 04/13/2017 0907   MCV 96.1 04/13/2017 0907   MCH 32.3 04/13/2017 0907   MCHC 33.6 04/13/2017 0907   RDW 21.4 (H) 04/13/2017 0907   LYMPHSABS 2.6 04/13/2017 0907   MONOABS 1.8 (H) 04/13/2017 0907   EOSABS 0.0 04/13/2017 0907   BASOSABS 0.0 04/13/2017 0907   CMP Latest Ref Rng & Units 04/13/2017 04/02/2017 03/23/2017  Glucose 65 - 99 mg/dL 93 87 110(H)  BUN 6 - 20 mg/dL 23(H) 22(H) 22(H)  Creatinine 0.61 - 1.24 mg/dL 0.94 0.85 0.80  Sodium 135 - 145 mmol/L 133(L) 137 135  Potassium 3.5 - 5.1 mmol/L 4.0 3.7 4.3  Chloride 101 - 111 mmol/L 99(L) 103 99(L)  CO2 22 - 32 mmol/L '28 25 27  '$ Calcium 8.9 - 10.3 mg/dL  8.8(L) 9.0 9.1  Total Protein 6.5 - 8.1 g/dL 6.2(L) - 6.5  Total Bilirubin 0.3 - 1.2 mg/dL 0.5 - 0.4  Alkaline Phos 38 - 126 U/L 62 - 67  AST 15 - 41 U/L 16 - 18  ALT 17 - 63 U/L 22 - 20    PENDING LABS:    DIAGNOSTIC IMAGING:  *The following radiologic images and reports have been reviewed independently and agree with below findings.  CT neck: 01/16/17     CT chest: 01/16/17    PET scan: 02/02/17    MRI brain: 02/11/17 CLINICAL DATA:  Advanced stage small cell lung cancer. Staging for metastatic disease  EXAM: MRI HEAD WITHOUT AND WITH CONTRAST  TECHNIQUE: Multiplanar, multiecho pulse sequences of the brain and surrounding structures were obtained without and with intravenous contrast.  CONTRAST:  45m MULTIHANCE GADOBENATE DIMEGLUMINE 529 MG/ML IV SOLN  COMPARISON:  None.  FINDINGS: Brain: Negative for hydrocephalus. Cerebral volume normal for age. Small nonenhancing white matter hyperintensities consistent with mild chronic microvascular ischemia. No acute infarct. Negative for hemorrhage or mass or edema  Normal enhancement postcontrast infusion. No enhancing mass lesion. Leptomeningeal enhancement is normal.  Vascular: Normal arterial flow voids.  Normal venous enhancement  Skull and upper cervical spine: Negative  Sinuses/Orbits: Negative  Other: None  IMPRESSION: Negative for metastatic disease.  No acute abnormality.  Mild chronic white matter changes.   Electronically Signed   By: CFranchot GalloM.D.   On: 02/11/2017 15:35    PATHOLOGY:  (R) neck lymph node biopsy: 02/03/17          ASSESSMENT & PLAN:   Extensive-stage small cell lung cancer with liver mets:  -Initially presented to his PCP in early 01/2017 with 3-week history of right neck mass.  CT chest performed on 01/16/17 revealed extensive lymphadenopathy in chest and lower right cervical lymph node area; also indeterminate lesion in liver.  CT neck also performed  on 01/16/17 also showed bulky (R) supraclavicular malignant lymph node measuring 5.4 cm, with  several abnormal level 3 & 4 cervical lymph nodes and lymphadenopathy to superior mediastinum. IR attempted to place port-a-cath for anticipated chemotherapy; they were unable to place port d/t enlarged SVC and aborted procedure d/t concerns for patient safety.  Biopsy of (R) neck mass revealed small cell carcinoma, most consistent with a lung primary.   Patient has significant response in his stage IV small cell carcinoma of lung Will continue chemotherapy PET scan so significant response has been reviewed independently  -Continue chemotherapy as planned. Plan for 6 cycles of cisplatin and etoposide. Plan to restage to assess for response to treatment with PET CT after cycle 3 of chemo, orders placed today. -Clinially he is responding well with significant decrease on physical exam of his right supraclavicular lymphadenopathy. -After he completes 6 cycles of chemo, he will return back to rad-onc for evaluation for PCI.   Tobacco use disorder:  -Continue efforts on smoke cessation.

## 2017-04-13 NOTE — Progress Notes (Signed)
Lab results reviewed by Dr. Talbert Cage - okay to tx today per MD.   Tolerated tx w/o adverse reaction.  Alert, in no distress.  VSS.  Discharged ambulatory.

## 2017-04-14 ENCOUNTER — Encounter (HOSPITAL_BASED_OUTPATIENT_CLINIC_OR_DEPARTMENT_OTHER): Payer: Medicare PPO

## 2017-04-14 VITALS — BP 144/59 | HR 72 | Temp 97.6°F | Resp 18 | Wt 174.2 lb

## 2017-04-14 DIAGNOSIS — C3490 Malignant neoplasm of unspecified part of unspecified bronchus or lung: Secondary | ICD-10-CM

## 2017-04-14 DIAGNOSIS — C787 Secondary malignant neoplasm of liver and intrahepatic bile duct: Secondary | ICD-10-CM | POA: Diagnosis not present

## 2017-04-14 DIAGNOSIS — Z5111 Encounter for antineoplastic chemotherapy: Secondary | ICD-10-CM | POA: Diagnosis not present

## 2017-04-14 DIAGNOSIS — C770 Secondary and unspecified malignant neoplasm of lymph nodes of head, face and neck: Secondary | ICD-10-CM | POA: Diagnosis not present

## 2017-04-14 MED ORDER — SODIUM CHLORIDE 0.9 % IV SOLN
Freq: Once | INTRAVENOUS | Status: AC
  Administered 2017-04-14: 10:00:00 via INTRAVENOUS

## 2017-04-14 MED ORDER — SODIUM CHLORIDE 0.9 % IV SOLN: 10.0000 mg | Freq: Once | INTRAVENOUS | Status: DC

## 2017-04-14 MED ORDER — SODIUM CHLORIDE 0.9 % IV SOLN
100.0000 mg/m2 | Freq: Once | INTRAVENOUS | Status: AC
  Administered 2017-04-14: 190 mg via INTRAVENOUS
  Filled 2017-04-14: qty 9.5

## 2017-04-14 MED ORDER — DEXAMETHASONE SODIUM PHOSPHATE 10 MG/ML IJ SOLN
10.0000 mg | Freq: Once | INTRAMUSCULAR | Status: AC
  Administered 2017-04-14: 10 mg via INTRAVENOUS

## 2017-04-14 MED ORDER — SODIUM CHLORIDE 0.9% FLUSH: 10.0000 mL | INTRAVENOUS | Status: DC | PRN

## 2017-04-14 MED ORDER — DEXAMETHASONE SODIUM PHOSPHATE 10 MG/ML IJ SOLN
INTRAMUSCULAR | Status: AC
  Filled 2017-04-14: qty 1

## 2017-04-15 ENCOUNTER — Encounter (HOSPITAL_COMMUNITY): Payer: Self-pay

## 2017-04-15 ENCOUNTER — Encounter (HOSPITAL_BASED_OUTPATIENT_CLINIC_OR_DEPARTMENT_OTHER): Payer: Medicare PPO

## 2017-04-15 VITALS — BP 129/55 | HR 71 | Temp 97.5°F | Resp 18 | Wt 176.6 lb

## 2017-04-15 DIAGNOSIS — C787 Secondary malignant neoplasm of liver and intrahepatic bile duct: Secondary | ICD-10-CM | POA: Diagnosis not present

## 2017-04-15 DIAGNOSIS — C3490 Malignant neoplasm of unspecified part of unspecified bronchus or lung: Secondary | ICD-10-CM

## 2017-04-15 DIAGNOSIS — C770 Secondary and unspecified malignant neoplasm of lymph nodes of head, face and neck: Secondary | ICD-10-CM | POA: Diagnosis not present

## 2017-04-15 DIAGNOSIS — Z5111 Encounter for antineoplastic chemotherapy: Secondary | ICD-10-CM

## 2017-04-15 MED ORDER — DEXAMETHASONE SODIUM PHOSPHATE 10 MG/ML IJ SOLN
10.0000 mg | Freq: Once | INTRAMUSCULAR | Status: AC
  Administered 2017-04-15: 10 mg via INTRAVENOUS
  Filled 2017-04-15: qty 1

## 2017-04-15 MED ORDER — SODIUM CHLORIDE 0.9 % IV SOLN: 10.0000 mg | Freq: Once | INTRAVENOUS | Status: DC

## 2017-04-15 MED ORDER — PEGFILGRASTIM 6 MG/0.6ML ~~LOC~~ PSKT
6.0000 mg | PREFILLED_SYRINGE | Freq: Once | SUBCUTANEOUS | Status: AC
  Administered 2017-04-15: 6 mg via SUBCUTANEOUS

## 2017-04-15 MED ORDER — DEXAMETHASONE SODIUM PHOSPHATE 10 MG/ML IJ SOLN
INTRAMUSCULAR | Status: AC
  Filled 2017-04-15: qty 1

## 2017-04-15 MED ORDER — HEPARIN SOD (PORK) LOCK FLUSH 100 UNIT/ML IV SOLN
500.0000 [IU] | Freq: Once | INTRAVENOUS | Status: AC | PRN
  Administered 2017-04-15: 500 [IU]
  Filled 2017-04-15: qty 5

## 2017-04-15 MED ORDER — SODIUM CHLORIDE 0.9 % IV SOLN
100.0000 mg/m2 | Freq: Once | INTRAVENOUS | Status: AC
  Administered 2017-04-15: 190 mg via INTRAVENOUS
  Filled 2017-04-15: qty 9.5

## 2017-04-15 MED ORDER — PEGFILGRASTIM 6 MG/0.6ML ~~LOC~~ PSKT
PREFILLED_SYRINGE | SUBCUTANEOUS | Status: AC
  Filled 2017-04-15: qty 0.6

## 2017-04-15 MED ORDER — SODIUM CHLORIDE 0.9 % IV SOLN
Freq: Once | INTRAVENOUS | Status: AC
  Administered 2017-04-15: 500 mL via INTRAVENOUS

## 2017-04-15 NOTE — Patient Instructions (Signed)
[facility] Discharge Instructions for Patients Receiving Chemotherapy  Today you received the following chemotherapy agents etoposide.   To help prevent nausea and vomiting after your treatment, we encourage you to take your nausea medication.    If you develop nausea and vomiting that is not controlled by your nausea medication, call the clinic.   BELOW ARE SYMPTOMS THAT SHOULD BE REPORTED IMMEDIATELY:  *FEVER GREATER THAN 100.5 F  *CHILLS WITH OR WITHOUT FEVER  NAUSEA AND VOMITING THAT IS NOT CONTROLLED WITH YOUR NAUSEA MEDICATION  *UNUSUAL SHORTNESS OF BREATH  *UNUSUAL BRUISING OR BLEEDING  TENDERNESS IN MOUTH AND THROAT WITH OR WITHOUT PRESENCE OF ULCERS  *URINARY PROBLEMS  *BOWEL PROBLEMS  UNUSUAL RASH Items with * indicate a potential emergency and should be followed up as soon as possible.  Feel free to call the clinic should you have any questions or concerns. The clinic phone number is [phone].  Please show the [facility] at check-in to the Emergency Department and triage nurse.

## 2017-04-15 NOTE — Progress Notes (Signed)
To treatment room for day 3 chemotherapy.  No complaints voiced today.    Patient tolerated chemotherapy with no complaints voiced.  Port site clean and dry with no bruising or swelling noted at site.  Band aid applied.  Neulasta Onpro applied and intact with green flashing light.  No complaints at site from the patient.  VSS with discharge and left ambulatory.  No s/s of distress.

## 2017-04-25 ENCOUNTER — Other Ambulatory Visit (HOSPITAL_COMMUNITY): Payer: Self-pay | Admitting: Oncology

## 2017-04-25 DIAGNOSIS — C3490 Malignant neoplasm of unspecified part of unspecified bronchus or lung: Secondary | ICD-10-CM

## 2017-05-04 ENCOUNTER — Encounter (HOSPITAL_BASED_OUTPATIENT_CLINIC_OR_DEPARTMENT_OTHER): Payer: Medicare PPO

## 2017-05-04 ENCOUNTER — Encounter (HOSPITAL_COMMUNITY): Payer: Self-pay

## 2017-05-04 VITALS — BP 135/62 | HR 71 | Temp 98.1°F | Resp 18 | Wt 172.0 lb

## 2017-05-04 DIAGNOSIS — Z5111 Encounter for antineoplastic chemotherapy: Secondary | ICD-10-CM

## 2017-05-04 DIAGNOSIS — C3490 Malignant neoplasm of unspecified part of unspecified bronchus or lung: Secondary | ICD-10-CM | POA: Diagnosis not present

## 2017-05-04 DIAGNOSIS — C787 Secondary malignant neoplasm of liver and intrahepatic bile duct: Secondary | ICD-10-CM | POA: Diagnosis not present

## 2017-05-04 DIAGNOSIS — C770 Secondary and unspecified malignant neoplasm of lymph nodes of head, face and neck: Secondary | ICD-10-CM | POA: Diagnosis not present

## 2017-05-04 LAB — CBC WITH DIFFERENTIAL/PLATELET
Basophils Absolute: 0 10*3/uL (ref 0.0–0.1)
Basophils Relative: 0 %
Eosinophils Absolute: 0 10*3/uL (ref 0.0–0.7)
Eosinophils Relative: 0 %
HCT: 32.9 % — ABNORMAL LOW (ref 39.0–52.0)
Hemoglobin: 11.1 g/dL — ABNORMAL LOW (ref 13.0–17.0)
Lymphocytes Relative: 12 %
Lymphs Abs: 2.2 10*3/uL (ref 0.7–4.0)
MCH: 33 pg (ref 26.0–34.0)
MCHC: 33.7 g/dL (ref 30.0–36.0)
MCV: 97.9 fL (ref 78.0–100.0)
Monocytes Absolute: 1.1 10*3/uL — ABNORMAL HIGH (ref 0.1–1.0)
Monocytes Relative: 6 %
Neutro Abs: 14.9 10*3/uL — ABNORMAL HIGH (ref 1.7–7.7)
Neutrophils Relative %: 82 %
Platelets: 183 10*3/uL (ref 150–400)
RBC: 3.36 MIL/uL — ABNORMAL LOW (ref 4.22–5.81)
RDW: 22 % — ABNORMAL HIGH (ref 11.5–15.5)
WBC Morphology: INCREASED
WBC: 18.2 10*3/uL — ABNORMAL HIGH (ref 4.0–10.5)

## 2017-05-04 LAB — COMPREHENSIVE METABOLIC PANEL
ALT: 28 U/L (ref 17–63)
AST: 17 U/L (ref 15–41)
Albumin: 3.3 g/dL — ABNORMAL LOW (ref 3.5–5.0)
Alkaline Phosphatase: 82 U/L (ref 38–126)
Anion gap: 5 (ref 5–15)
BUN: 30 mg/dL — ABNORMAL HIGH (ref 6–20)
CO2: 26 mmol/L (ref 22–32)
Calcium: 8.8 mg/dL — ABNORMAL LOW (ref 8.9–10.3)
Chloride: 103 mmol/L (ref 101–111)
Creatinine, Ser: 0.83 mg/dL (ref 0.61–1.24)
GFR calc Af Amer: >60 mL/min (ref >60)
GFR calc non Af Amer: >60 mL/min (ref >60)
Glucose, Bld: 97 mg/dL (ref 65–99)
Potassium: 4.1 mmol/L (ref 3.5–5.1)
Sodium: 134 mmol/L — ABNORMAL LOW (ref 135–145)
Total Bilirubin: 0.3 mg/dL (ref 0.3–1.2)
Total Protein: 5.6 g/dL — ABNORMAL LOW (ref 6.5–8.1)

## 2017-05-04 LAB — MAGNESIUM: Magnesium: 1.9 mg/dL (ref 1.7–2.4)

## 2017-05-04 MED ORDER — SODIUM CHLORIDE 0.9% FLUSH
10.0000 mL | INTRAVENOUS | Status: DC | PRN
  Administered 2017-05-04: 10 mL
  Filled 2017-05-04: qty 10

## 2017-05-04 MED ORDER — SODIUM CHLORIDE 0.9 % IV SOLN
80.0000 mg/m2 | Freq: Once | INTRAVENOUS | Status: AC
  Administered 2017-05-04: 153 mg via INTRAVENOUS
  Filled 2017-05-04: qty 153

## 2017-05-04 MED ORDER — SODIUM CHLORIDE 0.9 % IV SOLN
Freq: Once | INTRAVENOUS | Status: AC
  Administered 2017-05-04: 11:00:00 via INTRAVENOUS

## 2017-05-04 MED ORDER — MANNITOL 25 % IV SOLN
Freq: Once | INTRAVENOUS | Status: AC
  Administered 2017-05-04: 10:00:00 via INTRAVENOUS
  Filled 2017-05-04: qty 10

## 2017-05-04 MED ORDER — PALONOSETRON HCL INJECTION 0.25 MG/5ML
0.2500 mg | Freq: Once | INTRAVENOUS | Status: AC
  Administered 2017-05-04: 0.25 mg via INTRAVENOUS
  Filled 2017-05-04: qty 5

## 2017-05-04 MED ORDER — SODIUM CHLORIDE 0.9 % IV SOLN
100.0000 mg/m2 | Freq: Once | INTRAVENOUS | Status: AC
  Administered 2017-05-04: 190 mg via INTRAVENOUS
  Filled 2017-05-04: qty 9.5

## 2017-05-04 MED ORDER — SODIUM CHLORIDE 0.9 % IV SOLN
Freq: Once | INTRAVENOUS | Status: AC
  Administered 2017-05-04: 12:00:00 via INTRAVENOUS
  Filled 2017-05-04: qty 5

## 2017-05-04 MED ORDER — HEPARIN SOD (PORK) LOCK FLUSH 100 UNIT/ML IV SOLN
500.0000 [IU] | Freq: Once | INTRAVENOUS | Status: AC | PRN
  Administered 2017-05-04: 500 [IU]
  Filled 2017-05-04 (×2): qty 5

## 2017-05-04 NOTE — Patient Instructions (Signed)
[facility] Discharge Instructions for Patients Receiving Chemotherapy   Beginning January 23rd 2017 lab work for the Cancer Center will be done in the  Main lab at [facility] on 1st floor. If you have a lab appointment with the Cancer Center please come in thru the  Main Entrance and check in at the main information desk   Today you received the following chemotherapy agents   To help prevent nausea and vomiting after your treatment, we encourage you to take your nausea medication     If you develop nausea and vomiting, or diarrhea that is not controlled by your medication, call the clinic.  The clinic phone number is [phone]. Office hours are Monday-Friday 8:30am-5:00pm.  BELOW ARE SYMPTOMS THAT SHOULD BE REPORTED IMMEDIATELY:  *FEVER GREATER THAN 101.0 F  *CHILLS WITH OR WITHOUT FEVER  NAUSEA AND VOMITING THAT IS NOT CONTROLLED WITH YOUR NAUSEA MEDICATION  *UNUSUAL SHORTNESS OF BREATH  *UNUSUAL BRUISING OR BLEEDING  TENDERNESS IN MOUTH AND THROAT WITH OR WITHOUT PRESENCE OF ULCERS  *URINARY PROBLEMS  *BOWEL PROBLEMS  UNUSUAL RASH Items with * indicate a potential emergency and should be followed up as soon as possible. If you have an emergency after office hours please contact your primary care physician or go to the nearest emergency department.  Please call the clinic during office hours if you have any questions or concerns.   You may also contact the Patient Navigator at [phone] should you have any questions or need assistance in obtaining follow up care.      Resources For Cancer Patients and their Caregivers ? American Cancer Society: Can assist with transportation, wigs, general needs, runs Look Good Feel Better.        1-888-227-6333 ? Cancer Care: Provides financial assistance, online support groups, medication/co-pay assistance.  1-800-813-HOPE (4673) ? [facility] Assists [place] cancer  patients and their families through emotional , educational and financial support.  336-427-4357 ? [place] DSS Where to apply for food stamps, Medicaid and utility assistance. 336-342-1394 ? [facility]: Transportation to medical appointments. 336-347-2287 ? Social Security Administration: May apply for disability if have a Stage IV cancer. 336-342-7796 1-800-772-1213 ? [place] Aging, Disability and Transit Services: Assists with nutrition, care and transit needs. 336-349-2343         

## 2017-05-04 NOTE — Progress Notes (Signed)
Labs were reviewed. Urine output documented and proceed with treatment per MD.  Treatment given per orders. Patient tolerated it well without problems. Vitals stable and discharged home from clinic ambulatory. Follow up as scheduled.

## 2017-05-05 ENCOUNTER — Encounter (HOSPITAL_BASED_OUTPATIENT_CLINIC_OR_DEPARTMENT_OTHER): Payer: Medicare PPO | Admitting: Oncology

## 2017-05-05 ENCOUNTER — Encounter (HOSPITAL_BASED_OUTPATIENT_CLINIC_OR_DEPARTMENT_OTHER): Payer: Medicare PPO

## 2017-05-05 ENCOUNTER — Encounter (HOSPITAL_COMMUNITY): Payer: Self-pay | Admitting: Oncology

## 2017-05-05 VITALS — BP 158/73 | HR 72 | Temp 97.5°F | Resp 18

## 2017-05-05 DIAGNOSIS — C770 Secondary and unspecified malignant neoplasm of lymph nodes of head, face and neck: Secondary | ICD-10-CM | POA: Diagnosis not present

## 2017-05-05 DIAGNOSIS — R42 Dizziness and giddiness: Secondary | ICD-10-CM

## 2017-05-05 DIAGNOSIS — C3490 Malignant neoplasm of unspecified part of unspecified bronchus or lung: Secondary | ICD-10-CM | POA: Diagnosis not present

## 2017-05-05 DIAGNOSIS — C787 Secondary malignant neoplasm of liver and intrahepatic bile duct: Secondary | ICD-10-CM

## 2017-05-05 DIAGNOSIS — Z5111 Encounter for antineoplastic chemotherapy: Secondary | ICD-10-CM | POA: Diagnosis not present

## 2017-05-05 MED ORDER — SODIUM CHLORIDE 0.9 % IV SOLN
Freq: Once | INTRAVENOUS | Status: AC
  Administered 2017-05-05: 10:00:00 via INTRAVENOUS

## 2017-05-05 MED ORDER — DEXAMETHASONE 4 MG PO TABS: ORAL_TABLET | ORAL | 1 refills | Status: DC

## 2017-05-05 MED ORDER — HEPARIN SOD (PORK) LOCK FLUSH 100 UNIT/ML IV SOLN
500.0000 [IU] | Freq: Once | INTRAVENOUS | Status: AC | PRN
  Administered 2017-05-05: 500 [IU]
  Filled 2017-05-05: qty 5

## 2017-05-05 MED ORDER — SODIUM CHLORIDE 0.9% FLUSH
10.0000 mL | INTRAVENOUS | Status: DC | PRN
  Administered 2017-05-05: 10 mL
  Filled 2017-05-05: qty 10

## 2017-05-05 MED ORDER — DEXAMETHASONE SODIUM PHOSPHATE 10 MG/ML IJ SOLN
10.0000 mg | Freq: Once | INTRAMUSCULAR | Status: AC
Start: 2017-05-05 — End: 2017-05-05
  Administered 2017-05-05: 10 mg via INTRAVENOUS
  Filled 2017-05-05: qty 1

## 2017-05-05 MED ORDER — SODIUM CHLORIDE 0.9 % IV SOLN: 10.0000 mg | Freq: Once | INTRAVENOUS | Status: DC

## 2017-05-05 MED ORDER — ETOPOSIDE CHEMO INJECTION 1 GM/50ML
100.0000 mg/m2 | Freq: Once | INTRAVENOUS | Status: AC
  Administered 2017-05-05: 190 mg via INTRAVENOUS
  Filled 2017-05-05: qty 9.5

## 2017-05-05 NOTE — Progress Notes (Signed)
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 Bulky 5.4 cm right supraclavicular region malignant lymph node conglomeration with extracapsular extension. 2. Surrounding smaller abnormal right level 3 and level 5 lymph nodes, and the lymphadenopathy continues into the superior mediastinum, see Chest CT findings reported separately. 3. No other metastatic disease identified in the neck.      01/16/2017 Imaging    CT chest: IMPRESSION: 1. Extensive lymphadenopathy in the thorax and lower right cervical region, as discussed above. Primary differential considerations include lymphoma/leukemia or small cell carcinoma of the lung. Further evaluation a PET-CT could be considered to assess for additional sites of disease below the diaphragm if clinically appropriate. Additionally, ultrasound-guided biopsy of supraclavicular lymphadenopathy could be considered to establish a tissue diagnosis. 2. Indeterminate lesion in the periphery of segment 8 of the liver measuring 2.7 x 1.7 cm. Attention at time of follow-up PET-CT is recommended. 3. Aortic atherosclerosis, in addition to left main and 3 vessel coronary artery disease. Please note that although the presence of coronary artery calcium documents the presence of coronary artery disease, the severity of this disease and any potential stenosis cannot be assessed on this non-gated CT examination. Assessment for potential risk factor modification, dietary therapy or pharmacologic therapy may be  warranted, if clinically indicated. 4. There are calcifications of the aortic valve. Echocardiographic correlation for evaluation of potential valvular dysfunction may be warranted if clinically indicated. 5. Diffuse bronchial wall thickening with moderate centrilobular and paraseptal emphysema; imaging findings suggestive of underlying COPD.      02/03/2017 Initial Biopsy    (R) neck lymph node biopsy: SMALL CELL CARCINOMA (most likely lung primary).       02/03/2017 Miscellaneous    Port-a-cath attempted by IR; unable to place d/t enlarged SVC.       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 ([name])     02/09/2017 -  Chemotherapy    The patient had palonosetron (ALOXI) injection 0.25 mg, 0.25 mg, Intravenous,  Once, 1 of 4 cycles  pegfilgrastim (NEULASTA) injection 6 mg, 6 mg, Subcutaneous, Once, 1 of 1 cycle  pegfilgrastim (NEULASTA ONPRO KIT) injection 6 mg, 6 mg, Subcutaneous, Once, 0 of 3 cycles  CISplatin (PLATINOL) 153 mg in sodium chloride 0.9 % 500 mL chemo infusion, 80 mg/m2 = 153 mg, Intravenous,  Once, 1 of 4 cycles  etoposide (VEPESID) 190 mg in sodium chloride 0.9 % 500 mL chemo infusion, 100 mg/m2 = 190 mg, Intravenous,  Once, 1 of 4 cycles  fosaprepitant (EMEND) 150 mg, dexamethasone (DECADRON) 12 mg in sodium chloride 0.9 % 145 mL IVPB, , Intravenous,  Once, 1 of 4 cycles  for chemotherapy treatment.        02/11/2017 Imaging    MRI brain: CLINICAL DATA:  Advanced stage small cell lung cancer. Staging for metastatic disease  EXAM: MRI HEAD WITHOUT AND WITH CONTRAST  TECHNIQUE: Multiplanar, multiecho pulse sequences of the brain and surrounding structures were obtained without and with intravenous contrast.  CONTRAST:  47m MULTIHANCE GADOBENATE DIMEGLUMINE 529 MG/ML IV SOLN  COMPARISON:  None.  FINDINGS: Brain: Negative for hydrocephalus. Cerebral volume normal for age. Small nonenhancing white matter  hyperintensities consistent with mild chronic microvascular ischemia. No acute infarct. Negative for hemorrhage or mass or edema  Normal enhancement postcontrast infusion. No enhancing mass lesion. Leptomeningeal enhancement is normal.  Vascular: Normal arterial flow voids.  Normal venous enhancement  Skull and upper cervical spine: Negative  Sinuses/Orbits: Negative  Other: None  IMPRESSION: Negative for metastatic disease.  No acute abnormality.  Mild chronic white matter changes.      04/07/2017 Imaging     PET:  1. Marked reduction in size and metabolic activity of bulky RIGHT supraclavicular adenopathy mediastinal lymphadenopathy. 2. Residual moderate activity remains within small RIGHT supraclavicular lymph node, RIGHT lower paratracheal lymph node and RIGHT hilar lymph node. 3. Resolution of prevascular and internal mammary mediastinal metastatic hypermetabolic activity. 4. Resolution of metabolic activity associated with solitary RIGHT hepatic lobe liver metastasis. 5. No evidence of disease progression. 6. 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         INTERVAL HISTORY:  Mr. [age]Wildes89y.[id number]. male returns to cancer center for follow-up for extensive stage small cell lung cancer.   He is here for cycle 5 day 2 of chemotherapy with cisplatin and etoposide. He's been tolerating chemotherapy well. He states he has occasional dizziness if he stands up to quickly. He has also noted that he is having some occasional shakes in his right hand. His appetite is good and he is gaining weight. He denies any SOB, CP, abdominal pain, focal weakness, headaches, nausea, vomiting, diarrhea. His constipation is improved since his last visit with miralax. He continues to smoke but states he's cut down to 10 cigarettes a day.  REVIEW OF SYSTEMS:  Review of Systems  Constitutional:  Negative for chills, fatigue and fever.  HENT:  Negative.   Eyes: Negative.   Respiratory: Negative.  Negative for cough and shortness of breath.   Cardiovascular: Negative.  Negative for leg swelling.  Gastrointestinal: Positive for constipation. Negative for abdominal pain, blood in stool, diarrhea, nausea and vomiting.  Endocrine: Negative.   Musculoskeletal: Negative for arthralgias, back pain and neck pain.  Neurological: Positive for dizziness. Negative for headaches.  Hematological: Negative for adenopathy.  Psychiatric/Behavioral: Negative for sleep disturbance.     PAST MEDICAL/SURGICAL HISTORY:  Past Medical History:  Diagnosis Date  . Anxiety   . CAD (coronary artery disease)   . COPD (chronic obstructive pulmonary disease) ([place]   . Depression   . Myocardial infarction ([place]   . Osteopenia   . Panic attacks   . Smoker    Past Surgical History:  Procedure Laterality Date  . BACK SURGERY  12/24/2000   L5,S1  . CORONARY STENT PLACEMENT  2005   RCA & CX  . HERNIA REPAIR Right 1980's  . INGUINAL HERNIA REPAIR  12/1978   right side  . IR FLUORO GUIDE PORT INSERTION RIGHT  04/02/2017  . IR [place] BX ASP/DRAIN  02/03/2017  . IR [place] VASC ACCESS RIGHT  04/02/2017  . NM MYOCAR PERF WALL MOTION  09/07/2009   No ischemia; EF 51%  . SHOULDER SURGERY Left 08/2010  . SPINE SURGERY  2002   L5-S1     SOCIAL HISTORY:  Social History   Social History  . Marital status: Single    Spouse  name: N/A  . Number of children: N/A  . Years of education: N/A   Occupational History  . Not on file.   Social History Main Topics  . Smoking status: Current Every Day Smoker    Packs/day: 1.00    Types: Cigarettes  . Smokeless tobacco: Never Used  . Alcohol use No  . Drug use: No  . Sexual activity: Not on file   Other Topics Concern  . Not on file   Social History Narrative  . No narrative on file    FAMILY HISTORY:  Family History  Problem Relation Age of Onset    . Heart attack Father   . Kidney disease Father        renal failure  . Heart failure Mother   . Heart attack Mother   . Cancer Brother   . Diabetes Brother   . Alcohol abuse Brother   . Diabetes Sister     CURRENT MEDICATIONS:  Outpatient Encounter Prescriptions as of 05/05/2017  Medication Sig  . ALPRAZolam (XANAX) 1 MG tablet Take 1 mg by mouth 4 (four) times daily.  Marland Kitchen atorvastatin (LIPITOR) 10 MG tablet TAKE 1 TABLET AT BEDTIME  (DISCONTINUE ATORVASTATIN '20MG'$ )  . CISPLATIN IV Inject into the vein. Day 1 every 21 days  . clotrimazole-betamethasone (LOTRISONE) cream Apply 1 application topically 2 (two) times daily.  Marland Kitchen dexamethasone (DECADRON) 4 MG tablet Take 2 tablets two times a day for 1 day on day 4 after cisplatin chemotherapy. Take with food.  . ETOPOSIDE IV Inject into the vein. Days 1,2,3 every 21 days  . HYDROcodone-acetaminophen (NORCO) 5-325 MG tablet Take 0.5-1 tablets by mouth every 6 (six) hours as needed for moderate pain.  Marland Kitchen lidocaine-prilocaine (EMLA) cream Apply to affected area once  . LORazepam (ATIVAN) 0.5 MG tablet Take 1 tablet (0.5 mg total) by mouth every 6 (six) hours as needed (Nausea or vomiting).  . ondansetron (ZOFRAN) 8 MG tablet Take 1 tablet (8 mg total) by mouth 2 (two) times daily as needed. Start on the third day after cisplatin chemotherapy.  . Pegfilgrastim (NEULASTA ONPRO [place]) Inject into the skin. Every 21 days  . polyethylene glycol powder (GLYCOLAX/MIRALAX) powder Take 17 g by mouth daily.  . prochlorperazine (COMPAZINE) 10 MG tablet Take 1 tablet (10 mg total) by mouth every 6 (six) hours as needed (Nausea or vomiting).  . traZODone (DESYREL) 100 MG tablet Take 400 mg by mouth at bedtime.  . cyclobenzaprine (FLEXERIL) 10 MG tablet TAKE 1 TABLET THREE TIMES DAILY AS NEEDED FOR MUSCLE SPASMS. (Patient not taking: Reported on 05/05/2017)  . magnesium oxide (MAG-OX) 400 MG tablet Take 400 mg by mouth daily.  Marland Kitchen venlafaxine (EFFEXOR) 75 MG tablet  Take 150 mg by mouth 2 (two) times daily.    No facility-administered encounter medications on file as of 05/05/2017.     ALLERGIES:  Allergies  Allergen Reactions  . Codeine Nausea Only  . Niaspan [Niacin Er]      PHYSICAL EXAM:  ECOG Performance status: 1 - Symptomatic; remains independent   RN vitals reviewed.   Physical Exam  Constitutional: He is oriented to person, place, and time and well-developed, well-nourished, and in no distress.     HENT:  Head: Normocephalic.  Mouth/Throat: Oropharynx is clear and moist. No oropharyngeal exudate.  Eyes: Pupils are equal, round, and reactive to light. Conjunctivae are normal. No scleral icterus.  Neck: Normal range of motion.  Cardiovascular: Normal rate and regular rhythm.   Pulmonary/Chest: Effort  normal and breath sounds normal. No respiratory distress. He has no wheezes.  Abdominal: Soft. Bowel sounds are normal. There is no tenderness. There is no rebound.  Musculoskeletal: Normal range of motion. He exhibits no edema.  Neurological: He is alert and oriented to person, place, and time. No cranial nerve deficit. Gait normal.  Skin: Skin is warm. No pallor.  Psychiatric: Mood, memory, affect and judgment normal.  Nursing note and vitals reviewed.    LABORATORY DATA:  I have reviewed the labs as listed.  CBC    Component Value Date/Time   WBC 18.2 (H) 05/04/2017 0910   RBC 3.36 (L) 05/04/2017 0910   HGB 11.1 (L) 05/04/2017 0910   HCT 32.9 (L) 05/04/2017 0910   PLT 183 05/04/2017 0910   MCV 97.9 05/04/2017 0910   MCH 33.0 05/04/2017 0910   MCHC 33.7 05/04/2017 0910   RDW 22.0 (H) 05/04/2017 0910   LYMPHSABS 2.2 05/04/2017 0910   MONOABS 1.1 (H) 05/04/2017 0910   EOSABS 0.0 05/04/2017 0910   BASOSABS 0.0 05/04/2017 0910   CMP Latest Ref Rng & Units 05/04/2017 04/13/2017 04/02/2017  Glucose 65 - 99 mg/dL 97 93 87  BUN 6 - 20 mg/dL 30(H) 23(H) 22(H)  Creatinine 0.61 - 1.24 mg/dL 0.83 0.94 0.85  Sodium 135 - 145  mmol/L 134(L) 133(L) 137  Potassium 3.5 - 5.1 mmol/L 4.1 4.0 3.7  Chloride 101 - 111 mmol/L 103 99(L) 103  CO2 22 - 32 mmol/L '26 28 25  '$ Calcium 8.9 - 10.3 mg/dL 8.8(L) 8.8(L) 9.0  Total Protein 6.5 - 8.1 g/dL 5.6(L) 6.2(L) -  Total Bilirubin 0.3 - 1.2 mg/dL 0.3 0.5 -  Alkaline Phos 38 - 126 U/L 82 62 -  AST 15 - 41 U/L 17 16 -  ALT 17 - 63 U/L 28 22 -    PENDING LABS:    DIAGNOSTIC IMAGING:  *The following radiologic images and reports have been reviewed independently and agree with below findings.  CT neck: 01/16/17     CT chest: 01/16/17    PET scan: 02/02/17    MRI brain: 02/11/17 CLINICAL DATA:  Advanced stage small cell lung cancer. Staging for metastatic disease  EXAM: MRI HEAD WITHOUT AND WITH CONTRAST  TECHNIQUE: Multiplanar, multiecho pulse sequences of the brain and surrounding structures were obtained without and with intravenous contrast.  CONTRAST:  39m MULTIHANCE GADOBENATE DIMEGLUMINE 529 MG/ML IV SOLN  COMPARISON:  None.  FINDINGS: Brain: Negative for hydrocephalus. Cerebral volume normal for age. Small nonenhancing white matter hyperintensities consistent with mild chronic microvascular ischemia. No acute infarct. Negative for hemorrhage or mass or edema  Normal enhancement postcontrast infusion. No enhancing mass lesion. Leptomeningeal enhancement is normal.  Vascular: Normal arterial flow voids.  Normal venous enhancement  Skull and upper cervical spine: Negative  Sinuses/Orbits: Negative  Other: None  IMPRESSION: Negative for metastatic disease.  No acute abnormality.  Mild chronic white matter changes.   Electronically Signed   By: CFranchot GalloM.D.   On: 02/11/2017 15:35  Study Result   CLINICAL DATA:  Subsequent treatment strategy for small cell lung cancer. Stage IV. Patient status post chemotherapy (2-3 cycles). Subsequent treatment evaluation.  EXAM: NUCLEAR MEDICINE PET SKULL BASE TO  THIGH  TECHNIQUE: 9.4 mCi F-18 FDG was injected intravenously. Full-ring PET imaging was performed from the skull base to thigh after the radiotracer. CT data was obtained and used for attenuation correction and anatomic localization.  FASTING BLOOD GLUCOSE:  Value: 92 mg/dl  COMPARISON:  PET-CT scan 02/02/2017  FINDINGS: NECK  Persistent hypermetabolic focus within the RIGHT parotid gland with SUV max equal 7.5 not significant changed from prior. Associated Rounded nodule in the inferior aspect / tail of the parotid gland measuring 10 mm (image 28, series 4).  Marked reduction in size and metabolic activity of supraclavicular adenopathy. The size is markedly reduced with only residual 1.1 cm nodule (image 50, series 4). This compares to nodal mass of approximately 4 cm with individual lymph nodes measuring up to 2.5 cm. Metabolic activity is also significant reduce with SUV max equal 4.7 decreased from SUV max 17.8.  Resolution of the sub pectoralis hypermetabolic lymph nodes on the RIGHT.  Marked reduction in size and metabolic activity of paratracheal adenopathy. Nodal metabolic activity remains in RIGHT lower paratracheal lymph node measuring 11 mm short axis with SUV max equal 5.4 decreased from 30 mm short axis with SUV max 16.0. Residual activity in a small RIGHT hilar lymph node with SUV max equal 4.8.  There is complete resolution of the prevascular hypermetabolic adenopathy as well as the LEFT internal mammary adenopathy.  CHEST  ABDOMEN/PELVIS  No clear residual metabolic activity associated with the isolated RIGHT hepatic lobe liver metastasis. Lesion also decreased in size measuring 5 mm decreased from 25 mm (image 126, series 4  No abnormal hypermetabolic activity within the liver, pancreas, adrenal glands, or spleen. No hypermetabolic lymph nodes in the abdomen or pelvis.  SKELETON  No focal hypermetabolic activity to suggest  skeletal metastasis.  IMPRESSION: 1. Marked reduction in size and metabolic activity of bulky RIGHT supraclavicular adenopathy mediastinal lymphadenopathy. 2. Residual moderate activity remains within small RIGHT supraclavicular lymph node, RIGHT lower paratracheal lymph node and RIGHT hilar lymph node. 3. Resolution of prevascular and internal mammary mediastinal metastatic hypermetabolic activity. 4. Resolution of metabolic activity associated with solitary RIGHT hepatic lobe liver metastasis. 5. No evidence of disease progression. 6. 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     PATHOLOGY:  (R) neck lymph node biopsy: 02/03/17          ASSESSMENT & PLAN:   Extensive-stage small cell lung cancer with liver mets:  -Initially presented to his PCP in early 01/2017 with 3-week history of right neck mass.  CT chest performed on 01/16/17 revealed extensive lymphadenopathy in chest and lower right cervical lymph node area; also indeterminate lesion in liver.  CT neck also performed on 01/16/17 also showed bulky (R) supraclavicular malignant lymph node measuring 5.4 cm, with several abnormal level 3 & 4 cervical lymph nodes and lymphadenopathy to superior mediastinum. IR attempted to place port-a-cath for anticipated chemotherapy; they were unable to place port d/t enlarged SVC and aborted procedure d/t concerns for patient safety.  Biopsy of (R) neck mass revealed small cell carcinoma, most consistent with a lung primary.   PET scan does reveal hypermetabolic liver lesion, as well as multiple sites of hypermetabolic lymphadenopathy. He understands that his cancer is treatable, but not curable.   -Continue chemotherapy as planned. Plan for 6 cycles of cisplatin and etoposide. Plan to restage to assess for response to treatment with PET CT after cycle 6 of chemo.  Will plan to repeat MRI brain w/ and w/o  contrast after cycle 6 also. -Reviewed his last PET scan results with him again in detail and gave him a copy of his PET. -After he completes 6 cycles of chemo, he will return back to rad-onc for  evaluation for PCI.   Tobacco use disorder:  -Continue efforts on smoke cessation.  Dizziness: -Discussed with patient this this may be multifactorial from positional vertigo due to drop in BP, medications, chemo. Continue to monitor for increase in frequency of dizziness.   Dispo: RTC in 3 weeks for follow up. Continue chemo q3 weeks as scheduled.    All questions were answered to patient's stated satisfaction. Encouraged patient to call with any new concerns or questions before his next visit to the cancer center and we can certain see him sooner, if needed.    Twana First, MD

## 2017-05-05 NOTE — Patient Instructions (Signed)
[facility] Discharge Instructions for Patients Receiving Chemotherapy   Beginning January 23rd 2017 lab work for the [facility] will be done in the  Main lab at [facility] on 1st floor. If you have a lab appointment with the [facility] please come in thru the  Main Entrance and check in at the main information desk   Today you received the following chemotherapy agents Etoposide. Follow-up as scheduled. Call clinic for any questions or concerns  To help prevent nausea and vomiting after your treatment, we encourage you to take your nausea medication   If you develop nausea and vomiting, or diarrhea that is not controlled by your medication, call the clinic.  The clinic phone number is [phone]. Office hours are Monday-Friday 8:30am-5:00pm.  BELOW ARE SYMPTOMS THAT SHOULD BE REPORTED IMMEDIATELY:  *FEVER GREATER THAN 101.0 F  *CHILLS WITH OR WITHOUT FEVER  NAUSEA AND VOMITING THAT IS NOT CONTROLLED WITH YOUR NAUSEA MEDICATION  *UNUSUAL SHORTNESS OF BREATH  *UNUSUAL BRUISING OR BLEEDING  TENDERNESS IN MOUTH AND THROAT WITH OR WITHOUT PRESENCE OF ULCERS  *URINARY PROBLEMS  *BOWEL PROBLEMS  UNUSUAL RASH Items with * indicate a potential emergency and should be followed up as soon as possible. If you have an emergency after office hours please contact your primary care physician or go to the nearest emergency department.  Please call the clinic during office hours if you have any questions or concerns.   You may also contact the Patient Navigator at [phone] should you have any questions or need assistance in obtaining follow up care.      Resources For Cancer Patients and their Caregivers ? American Cancer Society: Can assist with transportation, wigs, general needs, runs Look Good Feel Better.        (530)339-2482 ? Cancer Care: Provides financial assistance, online support groups, medication/co-pay assistance.  1-800-813-HOPE  724-850-5305) ? [place] Assists [place] cancer patients and their families through emotional , educational and financial support.  (859) 677-5351 ? [place] DSS Where to apply for food stamps, Medicaid and utility assistance. 937-767-4740 ? [facility]: Transportation to medical appointments. 9250733919 ? Social Security Administration: May apply for disability if have a Stage IV cancer. (660)334-5807 (315)391-4607 ? [employer], Disability and Transit Services: Assists with nutrition, care and transit needs. (430)725-9606

## 2017-05-05 NOTE — Progress Notes (Signed)
[name] tolerated chemo tx well without complaints or incident. Port left accessed and flushed for use tomorrow. VSS upon discharge. Pt discharged self ambulatory in satisfactory condition accompanied by a friend

## 2017-05-06 ENCOUNTER — Encounter (HOSPITAL_BASED_OUTPATIENT_CLINIC_OR_DEPARTMENT_OTHER): Payer: Medicare PPO

## 2017-05-06 VITALS — BP 148/64 | HR 71 | Temp 97.7°F | Resp 18

## 2017-05-06 DIAGNOSIS — C770 Secondary and unspecified malignant neoplasm of lymph nodes of head, face and neck: Secondary | ICD-10-CM

## 2017-05-06 DIAGNOSIS — C787 Secondary malignant neoplasm of liver and intrahepatic bile duct: Secondary | ICD-10-CM

## 2017-05-06 DIAGNOSIS — Z5111 Encounter for antineoplastic chemotherapy: Secondary | ICD-10-CM

## 2017-05-06 DIAGNOSIS — C3490 Malignant neoplasm of unspecified part of unspecified bronchus or lung: Secondary | ICD-10-CM

## 2017-05-06 MED ORDER — HEPARIN SOD (PORK) LOCK FLUSH 100 UNIT/ML IV SOLN
500.0000 [IU] | Freq: Once | INTRAVENOUS | Status: AC | PRN
  Administered 2017-05-06: 500 [IU]
  Filled 2017-05-06: qty 5

## 2017-05-06 MED ORDER — DEXAMETHASONE SODIUM PHOSPHATE 10 MG/ML IJ SOLN
10.0000 mg | Freq: Once | INTRAMUSCULAR | Status: AC
  Administered 2017-05-06: 10 mg via INTRAVENOUS

## 2017-05-06 MED ORDER — SODIUM CHLORIDE 0.9 % IV SOLN
Freq: Once | INTRAVENOUS | Status: AC
  Administered 2017-05-06: 11:00:00 via INTRAVENOUS

## 2017-05-06 MED ORDER — SODIUM CHLORIDE 0.9 % IV SOLN: 10.0000 mg | Freq: Once | INTRAVENOUS | Status: DC

## 2017-05-06 MED ORDER — SODIUM CHLORIDE 0.9% FLUSH: 10.0000 mL | INTRAVENOUS | Status: DC | PRN

## 2017-05-06 MED ORDER — DEXAMETHASONE SODIUM PHOSPHATE 10 MG/ML IJ SOLN
INTRAMUSCULAR | Status: AC
  Filled 2017-05-06: qty 1

## 2017-05-06 MED ORDER — SODIUM CHLORIDE 0.9 % IV SOLN
100.0000 mg/m2 | Freq: Once | INTRAVENOUS | Status: AC
  Administered 2017-05-06: 190 mg via INTRAVENOUS
  Filled 2017-05-06: qty 9.5

## 2017-05-06 MED ORDER — PEGFILGRASTIM 6 MG/0.6ML ~~LOC~~ PSKT
6.0000 mg | PREFILLED_SYRINGE | Freq: Once | SUBCUTANEOUS | Status: AC
  Administered 2017-05-06: 6 mg via SUBCUTANEOUS
  Filled 2017-05-06: qty 0.6

## 2017-05-06 NOTE — Progress Notes (Signed)
Tolerated infusion w/o adverse reaction.  Alert, in no distress.  VSS.  Neulasta OBI placement per MD orders. OBI device filled per protocol and placed on right upper arm. Needle/catheter placement noted prior to patient leaving. Tolerated without incident and aware of injection to be delivered in 27 hours. Discharged ambulatory.

## 2017-05-11 ENCOUNTER — Other Ambulatory Visit (HOSPITAL_COMMUNITY): Payer: Self-pay | Admitting: Oncology

## 2017-05-11 DIAGNOSIS — C3490 Malignant neoplasm of unspecified part of unspecified bronchus or lung: Secondary | ICD-10-CM

## 2017-05-14 ENCOUNTER — Other Ambulatory Visit (HOSPITAL_COMMUNITY): Payer: Self-pay

## 2017-05-14 DIAGNOSIS — C3490 Malignant neoplasm of unspecified part of unspecified bronchus or lung: Secondary | ICD-10-CM

## 2017-05-14 MED ORDER — DEXAMETHASONE 4 MG PO TABS: ORAL_TABLET | ORAL | 1 refills | Status: DC

## 2017-05-14 NOTE — Telephone Encounter (Signed)
Patient requested refill on dexamethasone. This was refilled on 05/05/17 but was sent to the wrong pharmacy. Resent to [facility], per patients request.

## 2017-05-19 ENCOUNTER — Encounter: Payer: Self-pay | Admitting: Family Medicine

## 2017-05-19 ENCOUNTER — Ambulatory Visit: Payer: Medicare PPO | Admitting: Family Medicine

## 2017-05-19 ENCOUNTER — Other Ambulatory Visit (HOSPITAL_COMMUNITY): Payer: Self-pay

## 2017-05-19 VITALS — BP 160/70 | HR 82 | Temp 97.9°F | Resp 18 | Ht 69.0 in | Wt 173.0 lb

## 2017-05-19 DIAGNOSIS — M546 Pain in thoracic spine: Secondary | ICD-10-CM | POA: Diagnosis not present

## 2017-05-19 DIAGNOSIS — C3490 Malignant neoplasm of unspecified part of unspecified bronchus or lung: Secondary | ICD-10-CM

## 2017-05-19 DIAGNOSIS — G8929 Other chronic pain: Secondary | ICD-10-CM | POA: Diagnosis not present

## 2017-05-19 DIAGNOSIS — Z79899 Other long term (current) drug therapy: Secondary | ICD-10-CM

## 2017-05-19 MED ORDER — ONDANSETRON HCL 8 MG PO TABS: 8.0000 mg | ORAL_TABLET | Freq: Two times a day (BID) | ORAL | 1 refills | Status: DC | PRN

## 2017-05-19 MED ORDER — PROCHLORPERAZINE MALEATE 10 MG PO TABS: 10.0000 mg | ORAL_TABLET | Freq: Four times a day (QID) | ORAL | 1 refills | Status: DC | PRN

## 2017-05-19 MED ORDER — CYCLOBENZAPRINE HCL 5 MG PO TABS: 5.0000 mg | ORAL_TABLET | Freq: Three times a day (TID) | ORAL | 1 refills | Status: DC | PRN

## 2017-05-19 NOTE — Progress Notes (Signed)
Subjective:    Patient ID: [name], male    DOB: 08/28/1950, 66 y.o.   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  He is currently undergoing cycles of chemotherapy.  Repeat scans have shown regression of the size of the tumor.  He still has radiation therapy pending.  He seems to be doing well.  He is here today asking if I will take over management of his chronic medical conditions so that he can avoid having to go to his many doctors.  He has been seeing a psychiatrist for many many years.  Psychiatrist has had him on Xanax 1 mg 4 times daily for anxiety for more than 15 years.  He has had no problems with abuse or diversion.  I have been giving the patient Flexeril 10 mg as needed for the occasional muscle spasm in his back.  Due to his lung cancer, he does occasionally get spasms with deep inspiration that calls pleurisy.  He is resting that I start refilling his Xanax as well as his trazodone in addition to his Flexeril.  I spent more than 20 minutes with the patient today discussing polypharmacy, the risk of falls, the risk of overdose. Past Medical History:  Diagnosis Date  . Anxiety   . CAD (coronary artery disease)   . COPD (chronic obstructive pulmonary disease) ([place])   . Depression   . Myocardial infarction ([place])   . Osteopenia   . Panic attacks   . Smoker    Past Surgical History:  Procedure Laterality Date  . BACK SURGERY  12/24/2000   L5,S1  . CORONARY STENT PLACEMENT  2005   RCA & CX  . HERNIA REPAIR Right 1980's  . INGUINAL HERNIA REPAIR  12/1978   right side  . IR FLUORO GUIDE PORT INSERTION RIGHT  04/02/2017  . IR US GUIDE BX ASP/DRAIN  02/03/2017  . IR US GUIDE VASC ACCESS RIGHT  04/02/2017  . NM MYOCAR PERF WALL MOTION  09/07/2009   No ischemia; EF 51%  . SHOULDER SURGERY Left 08/2010  . SPINE SURGERY  2002   L5-S1   Current Outpatient Medications on File Prior to Visit  Medication Sig Dispense Refill  . ALPRAZolam  (XANAX) 1 MG tablet Take 1 mg by mouth 4 (four) times daily.    Marland Kitchen atorvastatin (LIPITOR) 10 MG tablet TAKE 1 TABLET AT BEDTIME  (DISCONTINUE ATORVASTATIN 20MG ) 90 tablet 3  . CISPLATIN IV Inject into the vein. Day 1 every 21 days    . clotrimazole-betamethasone (LOTRISONE) cream Apply 1 application topically 2 (two) times daily. 30 g 0  . cyclobenzaprine (FLEXERIL) 10 MG tablet TAKE 1 TABLET THREE TIMES DAILY AS NEEDED FOR MUSCLE SPASMS. 270 tablet 0  . dexamethasone (DECADRON) 4 MG tablet Take 2 tablets two times a day for 1 day on day 4 after cisplatin chemotherapy. Take with food. 30 tablet 1  . ETOPOSIDE IV Inject into the vein. Days 1,2,3 every 21 days    . HYDROcodone-acetaminophen (NORCO) 5-325 MG tablet Take 0.5-1 tablets by mouth every 6 (six) hours as needed for moderate pain. 60 tablet 0  . lidocaine-prilocaine (EMLA) cream Apply to affected area once 30 g 3  . Pegfilgrastim (NEULASTA ONPRO [place]) Inject into the skin. Every 21 days    . polyethylene glycol powder (GLYCOLAX/MIRALAX) powder Take 17 g by mouth daily. 3350 g 2  . traZODone (DESYREL) 100 MG tablet Take 400 mg by mouth at bedtime.  No current facility-administered medications on file prior to visit.    Allergies  Allergen Reactions  . Codeine Nausea Only  . Niaspan Durene Cal Er]    Social History   Socioeconomic History  . Marital status: Single    Spouse name: Not on file  . Number of children: Not on file  . Years of education: Not on file  . Highest education level: Not on file  Social Needs  . Financial resource strain: Not on file  . Food insecurity - worry: Not on file  . Food insecurity - inability: Not on file  . Transportation needs - medical: Not on file  . Transportation needs - non-medical: Not on file  Occupational History  . Not on file  Tobacco Use  . Smoking status: Current Every Day Smoker    Packs/day: 1.00    Types: Cigarettes  . Smokeless tobacco: Never Used  Substance and Sexual  Activity  . Alcohol use: No  . Drug use: No  . Sexual activity: Not on file  Other Topics Concern  . Not on file  Social History Narrative  . Not on file      Review of Systems  All other systems reviewed and are negative.      Objective:   Physical Exam  Neck: Neck supple.  Cardiovascular: Normal rate, regular rhythm and normal heart sounds.  Pulmonary/Chest: Effort normal. He has wheezes. He has no rales. He exhibits tenderness.  Abdominal: Soft. Bowel sounds are normal. He exhibits no distension and no mass. There is no tenderness. There is no rebound and no guarding.  Vitals reviewed.  Patient appears to be doing well despite having lost his hair and his beard.  He does not appear frail or cachectic       Assessment & Plan:  Polypharmacy- I am happy to refill the patient's Xanax as well as his trazodone and Lipitor to help simplify his medication regimen.  However I had a long discussion with the patient regarding polypharmacy.  I explained to him that the amount of Xanax he is taking makes him a high fall risk.  I also explained the Flexeril increases his risk of falling.  He is also getting hydrocodone to use sparingly for joint pain from his oncologist as well as nausea medication such as Compazine and also taking trazodone.  All of these medications can increase his risk of falls.  Therefore I recommended that he try to decrease the dose of Flexeril from 10 mg to 5 mg every 8 hours as needed.  I also recommended that he try to decrease the dose of Xanax that he is taking as much as possible to possibly a half a milligram 4 times a day.  I will gladly refill the 1 mg tablets given the fact he has been on them for more than 15 years but I have asked him to try to wean back where as possible to reduce his risk of falling and sedation.  However the patient is battling metastatic lung cancer.  He is facing end-of-life decisions.  Therefore I also want to weigh the risks versus the  benefits of continuing the medication and right now they are contributing to his quality of life and therefore I do not feel a strong desire to discontinue the medication at this time

## 2017-05-19 NOTE — Telephone Encounter (Signed)
Received refill request from patients pharmacy for compazine and zofran. Reviewed with MD, chart checked and refilled.

## 2017-05-22 NOTE — Progress Notes (Signed)
[name], [name] [id number]   CLINIC:  Medical Oncology/Hematology  PCP:  [name], MD [address] 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 Bulky 5.4 cm right supraclavicular region malignant lymph node conglomeration with extracapsular extension. 2. Surrounding smaller abnormal right level 3 and level 5 lymph nodes, and the lymphadenopathy continues into the superior mediastinum, see Chest CT findings reported separately. 3. No other metastatic disease identified in the neck.      01/16/2017 Imaging    CT chest: IMPRESSION: 1. Extensive lymphadenopathy in the thorax and lower right cervical region, as discussed above. Primary differential considerations include lymphoma/leukemia or small cell carcinoma of the lung. Further evaluation a PET-CT could be considered to assess for additional sites of disease below the diaphragm if clinically appropriate. Additionally, ultrasound-guided biopsy of supraclavicular lymphadenopathy could be considered to establish a tissue diagnosis. 2. Indeterminate lesion in the periphery of segment 8 of the liver measuring 2.7 x 1.7 cm. Attention at time of follow-up PET-CT is recommended. 3. Aortic atherosclerosis, in addition to left main and 3 vessel coronary artery disease. Please note that although the presence of coronary artery calcium documents the presence of coronary artery disease, the severity of this disease and any potential stenosis cannot be assessed on this non-gated CT examination. Assessment for potential risk factor modification, dietary therapy or pharmacologic therapy may be  warranted, if clinically indicated. 4. There are calcifications of the aortic valve. Echocardiographic correlation for evaluation of potential valvular dysfunction may be warranted if clinically indicated. 5. Diffuse bronchial wall thickening with moderate centrilobular and paraseptal emphysema; imaging findings suggestive of underlying COPD.      02/03/2017 Initial Biopsy    (R) neck lymph node biopsy: SMALL CELL CARCINOMA (most likely lung primary).       02/03/2017 Miscellaneous    Port-a-cath attempted by IR; unable to place d/t enlarged SVC.       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 ([name])      02/09/2017 -  Chemotherapy    The patient had palonosetron (ALOXI) injection 0.25 mg, 0.25 mg, Intravenous,  Once, 1 of 4 cycles  pegfilgrastim (NEULASTA) injection 6 mg, 6 mg, Subcutaneous, Once, 1 of 1 cycle  pegfilgrastim (NEULASTA ONPRO KIT) injection 6 mg, 6 mg, Subcutaneous, Once, 0 of 3 cycles  CISplatin (PLATINOL) 153 mg in sodium chloride 0.9 % 500 mL chemo infusion, 80 mg/m2 = 153 mg, Intravenous,  Once, 1 of 4 cycles  etoposide (VEPESID) 190 mg in sodium chloride 0.9 % 500 mL chemo infusion, 100 mg/m2 = 190 mg, Intravenous,  Once, 1 of 4 cycles  fosaprepitant (EMEND) 150 mg, dexamethasone (DECADRON) 12 mg in sodium chloride 0.9 % 145 mL IVPB, , Intravenous,  Once, 1 of 4 cycles  for chemotherapy treatment.        02/11/2017 Imaging    MRI brain: CLINICAL DATA:  Advanced stage small cell lung cancer. Staging for metastatic disease  EXAM: MRI HEAD WITHOUT AND WITH CONTRAST  TECHNIQUE: Multiplanar, multiecho pulse sequences of the brain and surrounding structures were obtained without and with intravenous contrast.  CONTRAST:  30m MULTIHANCE GADOBENATE DIMEGLUMINE 529 MG/ML IV SOLN  COMPARISON:  None.  FINDINGS: Brain: Negative for hydrocephalus. Cerebral volume normal for age. Small nonenhancing white matter  hyperintensities consistent with mild chronic microvascular ischemia. No acute infarct. Negative for hemorrhage or mass or edema  Normal enhancement postcontrast infusion. No enhancing mass lesion. Leptomeningeal enhancement is normal.  Vascular: Normal arterial flow voids.  Normal venous enhancement  Skull and upper cervical spine: Negative  Sinuses/Orbits: Negative  Other: None  IMPRESSION: Negative for metastatic disease.  No acute abnormality.  Mild chronic white matter changes.      04/07/2017 Imaging     PET:  1. Marked reduction in size and metabolic activity of bulky RIGHT supraclavicular adenopathy mediastinal lymphadenopathy. 2. Residual moderate activity remains within small RIGHT supraclavicular lymph node, RIGHT lower paratracheal lymph node and RIGHT hilar lymph node. 3. Resolution of prevascular and internal mammary mediastinal metastatic hypermetabolic activity. 4. Resolution of metabolic activity associated with solitary RIGHT hepatic lobe liver metastasis. 5. No evidence of disease progression. 6. 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         INTERVAL HISTORY:  [name] returns to cancer center for follow-up for extensive stage small cell lung cancer.   Here for cycle #6, day 2 Etoposide today. He received day 1 Cisplatin/Etoposide yesterday.   Overall, he tells me that he feels like he is doing "pretty good."  Appetite 100%; energy 50%.  He states that he is trying to drink plenty of fluids, including [employer] Instant Breakfast/Boost to supplement his diet.    Biggest complaint today is becoming lightheaded when standing or walking; this has been going on for the past few weeks.  He tells me that he needs a refill of his steroids (Decadron).  He tells me that he has been taking the Decadron BID "every day for the past few months."  He  reports that he "gets the shakes" often, where his hands will tremble and when he stands sometimes "my knees will shake too."  Denies any falls, "but when I feel the lightheadedness come on, I just go ahead and sit on the floor just in case."  Denies any headaches.    Endorses chest congestion that causes him to have intermittent chest pain ~once per day. He has intermittent green mucus/phlegm from his cough, but he thinks it may be d/t his sinus congestion as well.  Denies fever/chills. He continues to have arthritis pain to his arthritis and (L) hip; he is requesting a refill of his Norco today.   Bowels are moving "okay." States that "they go back and forth between diarrhea and constipation."  Reprots that he noted a very small amount of blood from his rectum recently "after I was constipated and had to strain. I think it was my hemorrhoid that I've had for a long time."  Denies any further bleeding episodes.  Denies any hematuria or dysuria; does state that he has h/o "bladder retention" that causes him to have urinary frequency at times, which has been a chronic issue for him. Denies N&V.       REVIEW OF SYSTEMS:  Review of Systems  Constitutional: Positive for fatigue.  Eyes: Negative.   Respiratory: Positive for cough.   Cardiovascular: Positive for chest pain.  Gastrointestinal: Positive for constipation and diarrhea. Negative for nausea and vomiting.  Endocrine: Negative.   Genitourinary: Negative for hematuria and nocturia.  Musculoskeletal: Positive for arthralgias and back pain.  Skin: Negative.  Negative for rash.  Neurological: Positive for dizziness and light-headedness. Negative for headaches.  Hematological: Negative.   Psychiatric/Behavioral: Negative.      PAST MEDICAL/SURGICAL HISTORY:  Past Medical History:  Diagnosis Date  . Anxiety   . CAD (coronary artery disease)   . COPD (chronic obstructive pulmonary disease) ([place])   . Depression   . Myocardial infarction  ([place])   . Osteopenia   . Panic attacks   . Smoker    Past Surgical History:  Procedure Laterality Date  . BACK SURGERY  12/24/2000   L5,S1  . CORONARY STENT PLACEMENT  2005   RCA & CX  . HERNIA REPAIR Right 1980's  . INGUINAL HERNIA REPAIR  12/1978   right side  . IR FLUORO GUIDE PORT INSERTION RIGHT  04/02/2017  . IR US GUIDE BX ASP/DRAIN  02/03/2017  . IR US GUIDE VASC ACCESS RIGHT  04/02/2017  . NM MYOCAR PERF WALL MOTION  09/07/2009   No ischemia; EF 51%  . SHOULDER SURGERY Left 08/2010  . SPINE SURGERY  2002   L5-S1     SOCIAL HISTORY:  Social History   Socioeconomic History  . Marital status: Single    Spouse name: Not on file  . Number of children: Not on file  . Years of education: Not on file  . Highest education level: Not on file  Social Needs  . Financial resource strain: Not on file  . Food insecurity - worry: Not on file  . Food insecurity - inability: Not on file  . Transportation needs - medical: Not on file  . Transportation needs - non-medical: Not on file  Occupational History  . Not on file  Tobacco Use  . Smoking status: Current Every Day Smoker    Packs/day: 1.00    Types: Cigarettes  . Smokeless tobacco: Never Used  Substance and Sexual Activity  . Alcohol use: No  . Drug use: No  . Sexual activity: Not on file  Other Topics Concern  . Not on file  Social History Narrative  . Not on file    FAMILY HISTORY:  Family History  Problem Relation Age of Onset  . Heart attack Father   . Kidney disease Father        renal failure  . Heart failure Mother   . Heart attack Mother   . Cancer Brother   . Diabetes Brother   . Alcohol abuse Brother   . Diabetes Sister     CURRENT MEDICATIONS:  Outpatient Encounter Medications as of 05/26/2017  Medication Sig  . ALPRAZolam (XANAX) 1 MG tablet Take 1 mg by mouth 4 (four) times daily.  Marland Kitchen atorvastatin (LIPITOR) 10 MG tablet TAKE 1 TABLET AT BEDTIME  (DISCONTINUE ATORVASTATIN 20MG)  . CISPLATIN  IV Inject into the vein. Day 1 every 21 days  . clotrimazole-betamethasone (LOTRISONE) cream Apply 1 application topically 2 (two) times daily.  . cyclobenzaprine (FLEXERIL) 10 MG tablet TAKE 1 TABLET THREE TIMES DAILY AS NEEDED FOR MUSCLE SPASMS.  . cyclobenzaprine (FLEXERIL) 5 MG tablet Take 1 tablet (5 mg total) 3 (three) times daily as needed by mouth for muscle spasms.  Marland Kitchen dexamethasone (DECADRON) 4 MG tablet Take 1 tablet two times a day for 2 days after chemo, then stop until next cycle of chemo. Take medication with food.  . ETOPOSIDE IV Inject into the vein. Days 1,2,3 every 21 days  . HYDROcodone-acetaminophen (NORCO) 5-325 MG  tablet Take 0.5-1 tablets every 6 (six) hours as needed by mouth for moderate pain.  Marland Kitchen lidocaine-prilocaine (EMLA) cream Apply to affected area once  . ondansetron (ZOFRAN) 8 MG tablet Take 1 tablet (8 mg total) 2 (two) times daily as needed by mouth. Start on the third day after cisplatin chemotherapy.  . Pegfilgrastim (NEULASTA ONPRO [place]) Inject into the skin. Every 21 days  . polyethylene glycol powder (GLYCOLAX/MIRALAX) powder Take 17 g by mouth daily.  . prochlorperazine (COMPAZINE) 10 MG tablet Take 1 tablet (10 mg total) every 6 (six) hours as needed by mouth (Nausea or vomiting).  . traZODone (DESYREL) 100 MG tablet Take 400 mg by mouth at bedtime.  . [DISCONTINUED] dexamethasone (DECADRON) 4 MG tablet Take 2 tablets two times a day for 1 day on day 4 after cisplatin chemotherapy. Take with food.  . [DISCONTINUED] HYDROcodone-acetaminophen (NORCO) 5-325 MG tablet Take 0.5-1 tablets by mouth every 6 (six) hours as needed for moderate pain.   Facility-Administered Encounter Medications as of 05/26/2017  Medication  . etoposide (VEPESID) 190 mg in sodium chloride 0.9 % 500 mL chemo infusion    ALLERGIES:  Allergies  Allergen Reactions  . Codeine Nausea Only  . Niaspan [Niacin Er]      PHYSICAL EXAM:  ECOG Performance status: 1 - Symptomatic; remains  independent      Physical Exam  Constitutional: He is oriented to person, place, and time.  Thin pale Greer in no acute distress  HENT:  Head: Normocephalic.  Mouth/Throat: Oropharynx is clear and moist. No oropharyngeal exudate.  Eyes: Conjunctivae are normal. Pupils are equal, round, and reactive to light. No scleral icterus.  Neck: Normal range of motion. Neck supple.  (R) supraclavicular fullness; improved from previous.   Cardiovascular: Regular rhythm.  Mildly tachycardic  Pulmonary/Chest: Effort normal. No respiratory distress.  Mild rhonchi to upper lobes; improved with cough. Expiratory wheezes at times as well.   Abdominal: Soft. Bowel sounds are normal. There is no tenderness.  Musculoskeletal: Normal range of motion. He exhibits edema (Trace ankle edema bilat ).  Neurological: He is alert and oriented to person, place, and time. No cranial nerve deficit.  Skin: Skin is warm and dry. No rash noted.  Psychiatric: Mood, memory, affect and judgment normal.  Nursing note and vitals reviewed.    LABORATORY DATA:  I have reviewed the labs as listed.  CBC    Component Value Date/Time   WBC 18.7 (H) 05/25/2017 0916   RBC 2.98 (L) 05/25/2017 0916   HGB 9.7 (L) 05/25/2017 0916   HCT 30.0 (L) 05/25/2017 0916   PLT 207 05/25/2017 0916   MCV 100.7 (H) 05/25/2017 0916   MCH 32.6 05/25/2017 0916   MCHC 32.3 05/25/2017 0916   RDW 21.3 (H) 05/25/2017 0916   LYMPHSABS 2.8 05/25/2017 0916   MONOABS 1.3 (H) 05/25/2017 0916   EOSABS 0.0 05/25/2017 0916   BASOSABS 0.0 05/25/2017 0916   CMP Latest Ref Rng & Units 05/25/2017 05/04/2017 04/13/2017  Glucose 65 - 99 mg/dL 169(H) 97 93  BUN 6 - 20 mg/dL 29(H) 30(H) 23(H)  Creatinine 0.61 - 1.24 mg/dL 0.91 0.83 0.94  Sodium 135 - 145 mmol/L 133(L) 134(L) 133(L)  Potassium 3.5 - 5.1 mmol/L 4.0 4.1 4.0  Chloride 101 - 111 mmol/L 101 103 99(L)  CO2 22 - 32 mmol/L _0 Calcium 8.9 - 10.3 mg/dL 8.6(L) 8.8(L) 8.8(L)  Total Protein  6.5 - 8.1 g/dL 5.7(L) 5.6(L) 6.2(L)  Total Bilirubin 0.3 -  1.2 mg/dL 0.3 0.3 0.5  Alkaline Phos 38 - 126 U/L 83 82 62  AST 15 - 41 U/L _0 ALT 17 - 63 U/L 36 28 22    PENDING LABS:    DIAGNOSTIC IMAGING:  *The following radiologic images and reports have been reviewed independently and agree with below findings.  PET scan: 04/07/17 CLINICAL DATA:  Subsequent treatment strategy for small cell lung cancer. Stage IV. Patient status post chemotherapy (2-3 cycles). Subsequent treatment evaluation.  EXAM: NUCLEAR MEDICINE PET SKULL BASE TO THIGH  TECHNIQUE: 9.4 mCi F-18 FDG was injected intravenously. Full-ring PET imaging was performed from the skull base to thigh after the radiotracer. CT data was obtained and used for attenuation correction and anatomic localization.  FASTING BLOOD GLUCOSE:  Value: 92 mg/dl  COMPARISON:  PET-CT scan 02/02/2017  FINDINGS: NECK  Persistent hypermetabolic focus within the RIGHT parotid gland with SUV max equal 7.5 not significant changed from prior. Associated Rounded nodule in the inferior aspect / tail of the parotid gland measuring 10 mm (image 28, series 4).  Marked reduction in size and metabolic activity of supraclavicular adenopathy. The size is markedly reduced with only residual 1.1 cm nodule (image 50, series 4). This compares to nodal mass of approximately 4 cm with individual lymph nodes measuring up to 2.5 cm. Metabolic activity is also significant reduce with SUV max equal 4.7 decreased from SUV max 17.8.  Resolution of the sub pectoralis hypermetabolic lymph nodes on the RIGHT.  Marked reduction in size and metabolic activity of paratracheal adenopathy. Nodal metabolic activity remains in RIGHT lower paratracheal lymph node measuring 11 mm short axis with SUV max equal 5.4 decreased from 30 mm short axis with SUV max 16.0. Residual activity in a small RIGHT hilar lymph node with SUV max equal 4.8.  There  is complete resolution of the prevascular hypermetabolic adenopathy as well as the LEFT internal mammary adenopathy.  CHEST  ABDOMEN/PELVIS  No clear residual metabolic activity associated with the isolated RIGHT hepatic lobe liver metastasis. Lesion also decreased in size measuring 5 mm decreased from 25 mm (image 126, series 4  No abnormal hypermetabolic activity within the liver, pancreas, adrenal glands, or spleen. No hypermetabolic lymph nodes in the abdomen or pelvis.  SKELETON  No focal hypermetabolic activity to suggest skeletal metastasis.  IMPRESSION: 1. Marked reduction in size and metabolic activity of bulky RIGHT supraclavicular adenopathy mediastinal lymphadenopathy. 2. Residual moderate activity remains within small RIGHT supraclavicular lymph node, RIGHT lower paratracheal lymph node and RIGHT hilar lymph node. 3. Resolution of prevascular and internal mammary mediastinal metastatic hypermetabolic activity. 4. Resolution of metabolic activity associated with solitary RIGHT hepatic lobe liver metastasis. 5. No evidence of disease progression. 6. 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   Electronically Signed   By: Suzy Bouchard M.D.   On: 04/07/2017 14:02       MRI brain: 02/11/17 CLINICAL DATA:  Advanced stage small cell lung cancer. Staging for metastatic disease  EXAM: MRI HEAD WITHOUT AND WITH CONTRAST  TECHNIQUE: Multiplanar, multiecho pulse sequences of the brain and surrounding structures were obtained without and with intravenous contrast.  CONTRAST:  84m MULTIHANCE GADOBENATE DIMEGLUMINE 529 MG/ML IV SOLN  COMPARISON:  None.  FINDINGS: Brain: Negative for hydrocephalus. Cerebral volume normal for age. Small nonenhancing white matter hyperintensities consistent with mild chronic microvascular ischemia. No acute infarct. Negative  for hemorrhage or mass or edema  Normal enhancement postcontrast infusion. No enhancing mass lesion. Leptomeningeal enhancement is normal.  Vascular: Normal arterial flow voids.  Normal venous enhancement  Skull and upper cervical spine: Negative  Sinuses/Orbits: Negative  Other: None  IMPRESSION: Negative for metastatic disease.  No acute abnormality.  Mild chronic white matter changes.   Electronically Signed   By: Franchot Gallo M.D.   On: 02/11/2017 15:35    PATHOLOGY:  (R) neck lymph node biopsy: 02/03/17           ASSESSMENT & PLAN:   Extensive-stage small cell lung cancer with liver mets:  -Initially presented to his PCP in early 01/2017 with 3-week history of right neck mass.  CT chest performed on 01/16/17 revealed extensive lymphadenopathy in chest and lower right cervical lymph node area; also indeterminate lesion in liver.  CT neck also performed on 01/16/17 also showed bulky (R) supraclavicular malignant lymph node measuring 5.4 cm, with several abnormal level 3 & 4 cervical lymph nodes and lymphadenopathy to superior mediastinum. IR attempted to place port-a-cath for anticipated chemotherapy; they were unable to place port d/t enlarged SVC and aborted procedure d/t concerns for patient safety.  Biopsy of (R) neck mass revealed small cell carcinoma, most consistent with a lung primary.  Initial PET scan revealed hypermetabolic liver lesion, as well as multiple sites of hypermetabolic lymphadenopathy. He understands that his cancer is treatable, but not curable.   -Due for cycle #6 Cisplatin/Etoposide today; labs reviewed and are adequate for treatment.  -Most recent restaging PET scan showed improvement in much of his disease.  He will need restaging following this cycle of chemo; PET scan to be done in ~3 weeks; orders placed today.  -Return to cancer center in ~3 weeks a few days after scans to review results and discuss additional treatment planning.     Dizziness:  -Symptoms worse with standing/position changes. Orthostatic vitals checked and he does not appear to be orthostatic.  -I suspect that his symptoms are r/t incorrectly taking his oral steroids (8 mg BID for the past several weeks, when he should have only been taking them a few days after each cycle of chemo).   -Will give him 500 mL NS today for supportive care for his dizziness and elevated BUN at 29 today.  -I have low clinical suspicion that his dizziness is d/t malignancy. However, he is due for restaging MRI brain imaging after this cycle of therapy; I will try to get MRI brain done as soon as able given his symptoms to ensure he does not have metastatic disease in the brain contributing to his dizziness.   -If MRI brain negative, then he will need referral back to [facility] in [place] for prophylactic cranial irradiation.    Arthralgias to back & hip:  -Likely multifactorial. There is evidence that chemotherapy can worsen previous existing arthritis pain. Norco is reportedly effective in controlling his pain and he only takes PRN.  -[place] Controlled Substance Reporting System reviewed.  No opiate prescriptions in the past 6 months. Paper prescription for Norco 5/325 given to patient today.      Constipation/Diarrhea:  -Likely multifactorial given chemo and opiates.  -Encouraged him to try taking Miralax with stool softeners to help with constipation.  He can use Imodium PRN for diarrhea as well.       Dispo:  -MRI brain as soon as able.  -Restaging PET scan in ~3 weeks after this (cycle #6) of chemo; orders placed today.  -Return to cancer center for  follow-up in ~3 weeks to review scan results and subsequent treatment planning.    All questions were answered to patient's stated satisfaction. Encouraged patient to call with any new concerns or questions before his next visit to the cancer center and we can certain see him sooner, if needed.    Plan of care discussed  with Dr. Talbert Cage, who agrees with the above aforementioned.      Orders placed this encounter:  Orders Placed This Encounter  Procedures  . MR Brain W Contrast  . NM PET Image Restag (PS) Skull Base To Thigh      [name], NP [place] [phone]

## 2017-05-25 ENCOUNTER — Encounter (HOSPITAL_COMMUNITY): Payer: Medicare PPO | Attending: Oncology

## 2017-05-25 ENCOUNTER — Encounter (HOSPITAL_COMMUNITY): Payer: Self-pay

## 2017-05-25 VITALS — BP 149/69 | HR 74 | Temp 97.8°F | Resp 20 | Wt 176.2 lb

## 2017-05-25 DIAGNOSIS — C770 Secondary and unspecified malignant neoplasm of lymph nodes of head, face and neck: Secondary | ICD-10-CM | POA: Diagnosis not present

## 2017-05-25 DIAGNOSIS — C787 Secondary malignant neoplasm of liver and intrahepatic bile duct: Secondary | ICD-10-CM

## 2017-05-25 DIAGNOSIS — Z5111 Encounter for antineoplastic chemotherapy: Secondary | ICD-10-CM

## 2017-05-25 DIAGNOSIS — C3490 Malignant neoplasm of unspecified part of unspecified bronchus or lung: Secondary | ICD-10-CM

## 2017-05-25 LAB — CBC WITH DIFFERENTIAL/PLATELET
Basophils Absolute: 0 10*3/uL (ref 0.0–0.1)
Basophils Relative: 0 %
Eosinophils Absolute: 0 10*3/uL (ref 0.0–0.7)
Eosinophils Relative: 0 %
HCT: 30 % — ABNORMAL LOW (ref 39.0–52.0)
Hemoglobin: 9.7 g/dL — ABNORMAL LOW (ref 13.0–17.0)
Lymphocytes Relative: 15 %
Lymphs Abs: 2.8 10*3/uL (ref 0.7–4.0)
MCH: 32.6 pg (ref 26.0–34.0)
MCHC: 32.3 g/dL (ref 30.0–36.0)
MCV: 100.7 fL — ABNORMAL HIGH (ref 78.0–100.0)
Monocytes Absolute: 1.3 10*3/uL — ABNORMAL HIGH (ref 0.1–1.0)
Monocytes Relative: 7 %
Neutro Abs: 14.6 10*3/uL — ABNORMAL HIGH (ref 1.7–7.7)
Neutrophils Relative %: 78 %
Platelets: 207 10*3/uL (ref 150–400)
RBC: 2.98 MIL/uL — ABNORMAL LOW (ref 4.22–5.81)
RDW: 21.3 % — ABNORMAL HIGH (ref 11.5–15.5)
WBC: 18.7 10*3/uL — ABNORMAL HIGH (ref 4.0–10.5)

## 2017-05-25 LAB — MAGNESIUM: Magnesium: 1.7 mg/dL (ref 1.7–2.4)

## 2017-05-25 LAB — COMPREHENSIVE METABOLIC PANEL
ALT: 36 U/L (ref 17–63)
AST: 21 U/L (ref 15–41)
Albumin: 3.3 g/dL — ABNORMAL LOW (ref 3.5–5.0)
Alkaline Phosphatase: 83 U/L (ref 38–126)
Anion gap: 6 (ref 5–15)
BUN: 29 mg/dL — ABNORMAL HIGH (ref 6–20)
CO2: 26 mmol/L (ref 22–32)
Calcium: 8.6 mg/dL — ABNORMAL LOW (ref 8.9–10.3)
Chloride: 101 mmol/L (ref 101–111)
Creatinine, Ser: 0.91 mg/dL (ref 0.61–1.24)
GFR calc Af Amer: >60 mL/min (ref >60)
GFR calc non Af Amer: >60 mL/min (ref >60)
Glucose, Bld: 169 mg/dL — ABNORMAL HIGH (ref 65–99)
Potassium: 4 mmol/L (ref 3.5–5.1)
Sodium: 133 mmol/L — ABNORMAL LOW (ref 135–145)
Total Bilirubin: 0.3 mg/dL (ref 0.3–1.2)
Total Protein: 5.7 g/dL — ABNORMAL LOW (ref 6.5–8.1)

## 2017-05-25 MED ORDER — SODIUM CHLORIDE 0.9% FLUSH
10.0000 mL | INTRAVENOUS | Status: DC | PRN
  Administered 2017-05-25: 10 mL
  Filled 2017-05-25: qty 10

## 2017-05-25 MED ORDER — PALONOSETRON HCL INJECTION 0.25 MG/5ML
0.2500 mg | Freq: Once | INTRAVENOUS | Status: AC
  Administered 2017-05-25: 0.25 mg via INTRAVENOUS

## 2017-05-25 MED ORDER — POTASSIUM CHLORIDE 2 MEQ/ML IV SOLN
Freq: Once | INTRAVENOUS | Status: AC
  Administered 2017-05-25: 12:00:00 via INTRAVENOUS
  Filled 2017-05-25: qty 10

## 2017-05-25 MED ORDER — SODIUM CHLORIDE 0.9 % IV SOLN
Freq: Once | INTRAVENOUS | Status: AC
  Administered 2017-05-25: 11:00:00 via INTRAVENOUS
  Filled 2017-05-25: qty 5

## 2017-05-25 MED ORDER — SODIUM CHLORIDE 0.9 % IV SOLN
100.0000 mg/m2 | Freq: Once | INTRAVENOUS | Status: AC
  Administered 2017-05-25: 190 mg via INTRAVENOUS
  Filled 2017-05-25: qty 9.5

## 2017-05-25 MED ORDER — HEPARIN SOD (PORK) LOCK FLUSH 100 UNIT/ML IV SOLN
INTRAVENOUS | Status: AC
  Filled 2017-05-25: qty 5

## 2017-05-25 MED ORDER — PALONOSETRON HCL INJECTION 0.25 MG/5ML
INTRAVENOUS | Status: AC
  Filled 2017-05-25: qty 5

## 2017-05-25 MED ORDER — HEPARIN SOD (PORK) LOCK FLUSH 100 UNIT/ML IV SOLN
500.0000 [IU] | Freq: Once | INTRAVENOUS | Status: AC
  Administered 2017-05-25: 500 [IU] via INTRAVENOUS

## 2017-05-25 MED ORDER — SODIUM CHLORIDE 0.9 % IV SOLN
Freq: Once | INTRAVENOUS | Status: AC
  Administered 2017-05-25: 11:00:00 via INTRAVENOUS

## 2017-05-25 MED ORDER — SODIUM CHLORIDE 0.9 % IV SOLN
80.0000 mg/m2 | Freq: Once | INTRAVENOUS | Status: AC
  Administered 2017-05-25: 153 mg via INTRAVENOUS
  Filled 2017-05-25: qty 153

## 2017-05-25 NOTE — Patient Instructions (Signed)
[facility] Discharge Instructions for Patients Receiving Chemotherapy  Today you received the following chemotherapy agents cisplatin and etoposide.    If you develop nausea and vomiting that is not controlled by your nausea medication, call the clinic.   BELOW ARE SYMPTOMS THAT SHOULD BE REPORTED IMMEDIATELY:  *FEVER GREATER THAN 100.5 F  *CHILLS WITH OR WITHOUT FEVER  NAUSEA AND VOMITING THAT IS NOT CONTROLLED WITH YOUR NAUSEA MEDICATION  *UNUSUAL SHORTNESS OF BREATH  *UNUSUAL BRUISING OR BLEEDING  TENDERNESS IN MOUTH AND THROAT WITH OR WITHOUT PRESENCE OF ULCERS  *URINARY PROBLEMS  *BOWEL PROBLEMS  UNUSUAL RASH Items with * indicate a potential emergency and should be followed up as soon as possible.  Feel free to call the clinic should you have any questions or concerns. The clinic phone number is [phone].  Please show the [facility] at check-in to the Emergency Department and triage nurse.

## 2017-05-25 NOTE — Progress Notes (Signed)
To treatment room for chemotherapy.  Patient stated arthritis in left hip and back and prn dizziness.  Rates 5 on pain scale for discomfort and uses home medication for relief.  Stated green drainage for sinuses but denied fever or chills.  Patient stated prn ringing in ears and occasional "cricket" sound in his ears.   Reviewed patient symptoms and lab work with Dr. Talbert Cage and ok to treat today.   Patient tolerated chemotherapy with no complaints voiced.  Port site clean and dry with no bruising or swelling noted at site.  Dressing intact. Port left accessed and heparin locked.  VSS with discharge and left ambulatory with no s/s of distress noted.

## 2017-05-26 ENCOUNTER — Encounter (HOSPITAL_BASED_OUTPATIENT_CLINIC_OR_DEPARTMENT_OTHER): Payer: Medicare PPO | Admitting: Adult Health

## 2017-05-26 ENCOUNTER — Other Ambulatory Visit: Payer: Self-pay

## 2017-05-26 ENCOUNTER — Encounter (HOSPITAL_BASED_OUTPATIENT_CLINIC_OR_DEPARTMENT_OTHER): Payer: Medicare PPO

## 2017-05-26 ENCOUNTER — Encounter (HOSPITAL_COMMUNITY): Payer: Self-pay | Admitting: Adult Health

## 2017-05-26 VITALS — BP 154/68 | HR 76 | Temp 97.6°F | Resp 20

## 2017-05-26 DIAGNOSIS — K5900 Constipation, unspecified: Secondary | ICD-10-CM | POA: Diagnosis not present

## 2017-05-26 DIAGNOSIS — R197 Diarrhea, unspecified: Secondary | ICD-10-CM

## 2017-05-26 DIAGNOSIS — R42 Dizziness and giddiness: Secondary | ICD-10-CM | POA: Diagnosis not present

## 2017-05-26 DIAGNOSIS — C770 Secondary and unspecified malignant neoplasm of lymph nodes of head, face and neck: Secondary | ICD-10-CM | POA: Diagnosis not present

## 2017-05-26 DIAGNOSIS — Z5111 Encounter for antineoplastic chemotherapy: Secondary | ICD-10-CM

## 2017-05-26 DIAGNOSIS — C3490 Malignant neoplasm of unspecified part of unspecified bronchus or lung: Secondary | ICD-10-CM | POA: Diagnosis not present

## 2017-05-26 DIAGNOSIS — M549 Dorsalgia, unspecified: Secondary | ICD-10-CM

## 2017-05-26 DIAGNOSIS — G893 Neoplasm related pain (acute) (chronic): Secondary | ICD-10-CM | POA: Diagnosis not present

## 2017-05-26 DIAGNOSIS — C787 Secondary malignant neoplasm of liver and intrahepatic bile duct: Secondary | ICD-10-CM | POA: Diagnosis not present

## 2017-05-26 DIAGNOSIS — M25559 Pain in unspecified hip: Secondary | ICD-10-CM

## 2017-05-26 MED ORDER — SODIUM CHLORIDE 0.9 % IV SOLN: 10.0000 mg | Freq: Once | INTRAVENOUS | Status: DC

## 2017-05-26 MED ORDER — DEXAMETHASONE 4 MG PO TABS: ORAL_TABLET | ORAL | 1 refills | Status: DC

## 2017-05-26 MED ORDER — SODIUM CHLORIDE 0.9 % IV SOLN
100.0000 mg/m2 | Freq: Once | INTRAVENOUS | Status: AC
  Administered 2017-05-26: 190 mg via INTRAVENOUS
  Filled 2017-05-26: qty 9.5

## 2017-05-26 MED ORDER — SODIUM CHLORIDE 0.9 % IV SOLN: INTRAVENOUS | Status: DC

## 2017-05-26 MED ORDER — SODIUM CHLORIDE 0.9 % IV SOLN
Freq: Once | INTRAVENOUS | Status: AC
Start: 2017-05-26 — End: 2017-05-26
  Administered 2017-05-26: 10:00:00 via INTRAVENOUS

## 2017-05-26 MED ORDER — DEXAMETHASONE SODIUM PHOSPHATE 10 MG/ML IJ SOLN
10.0000 mg | Freq: Once | INTRAMUSCULAR | Status: AC
  Administered 2017-05-26: 10 mg via INTRAVENOUS
  Filled 2017-05-26: qty 1

## 2017-05-26 MED ORDER — HYDROCODONE-ACETAMINOPHEN 5-325 MG PO TABS: 0.5000 | ORAL_TABLET | Freq: Four times a day (QID) | ORAL | 0 refills | Status: DC | PRN

## 2017-05-26 NOTE — Patient Instructions (Addendum)
[place] at [facility] Discharge Instructions  RECOMMENDATIONS MADE BY THE CONSULTANT AND ANY TEST RESULTS WILL BE SENT TO YOUR REFERRING PHYSICIAN.  You were seen today by [name], NP We are going to get your MRI of the brain as soon as possible You will have your full body PET scan in 3 weeks Follow up in office after scans for results and treatment  You only need to take your Dexamethasone 1 pill twice daily for 2 days after treatment... So when you come on Monday, Tuesday, and Wednesday you only need to take the Dexamethasone on Thursday and Friday and then stop until next treatment cycle.    See schedulers up front for appointments   Thank you for choosing [place] at [facility] to provide your oncology and hematology care.  To afford each patient quality time with our provider, please arrive at least 15 minutes before your scheduled appointment time.    If you have a lab appointment with the [facility] please come in thru the  Main Entrance and check in at the main information desk  You need to re-schedule your appointment should you arrive 10 or more minutes late.  We strive to give you quality time with our providers, and arriving late affects you and other patients whose appointments are after yours.  Also, if you no show three or more times for appointments you may be dismissed from the clinic at the providers discretion.     Again, thank you for choosing [facility].  Our hope is that these requests will decrease the amount of time that you wait before being seen by our physicians.       _____________________________________________________________  Should you have questions after your visit to [facility], please contact our office at [phone] between the hours of 8:30 a.m. and 4:30 p.m.  Voicemails left after 4:30 p.m. will not be returned until the following business day.  For  prescription refill requests, have your pharmacy contact our office.       Resources For Cancer Patients and their Caregivers ? American Cancer Society: Can assist with transportation, wigs, general needs, runs Look Good Feel Better.        6625570531 ? Cancer Care: Provides financial assistance, online support groups, medication/co-pay assistance.  1-800-813-HOPE 607 085 4511) ? [place] Assists [place] cancer patients and their families through emotional , educational and financial support.  (515) 765-3295 ? [place] DSS Where to apply for food stamps, Medicaid and utility assistance. 817-133-5665 ? [facility]: Transportation to medical appointments. 385-448-9260 ? Social Security Administration: May apply for disability if have a Stage IV cancer. 928-779-6642 252 510 6287 ? [employer], Disability and Transit Services: Assists with nutrition, care and transit needs. [place] Support Programs: @10RELATIVEDAYS @ > Cancer Support Group  2nd Tuesday of the month 1pm-2pm, Journey Room  > Creative Journey  3rd Tuesday of the month 1130am-1pm, Journey Room  > Look Good Feel Better  1st Wednesday of the month 10am-12 noon, Journey Room (Call [place] to register [phone])

## 2017-05-26 NOTE — Progress Notes (Signed)
Tolerated infusions w/o adverse reaction.  Alert, in no distress.  VSS.  Denies dizziness presently. Discharged ambulatory.

## 2017-05-27 ENCOUNTER — Encounter (HOSPITAL_COMMUNITY): Payer: Self-pay

## 2017-05-27 ENCOUNTER — Encounter (HOSPITAL_BASED_OUTPATIENT_CLINIC_OR_DEPARTMENT_OTHER): Payer: Medicare PPO

## 2017-05-27 ENCOUNTER — Other Ambulatory Visit (HOSPITAL_COMMUNITY): Payer: Self-pay

## 2017-05-27 VITALS — BP 143/70 | HR 74 | Temp 97.7°F | Resp 18 | Wt 177.6 lb

## 2017-05-27 DIAGNOSIS — C3490 Malignant neoplasm of unspecified part of unspecified bronchus or lung: Secondary | ICD-10-CM

## 2017-05-27 DIAGNOSIS — C787 Secondary malignant neoplasm of liver and intrahepatic bile duct: Secondary | ICD-10-CM | POA: Diagnosis not present

## 2017-05-27 DIAGNOSIS — C770 Secondary and unspecified malignant neoplasm of lymph nodes of head, face and neck: Secondary | ICD-10-CM

## 2017-05-27 DIAGNOSIS — Z5111 Encounter for antineoplastic chemotherapy: Secondary | ICD-10-CM

## 2017-05-27 MED ORDER — SODIUM CHLORIDE 0.9% FLUSH
10.0000 mL | INTRAVENOUS | Status: DC | PRN
  Administered 2017-05-27: 10 mL
  Filled 2017-05-27: qty 10

## 2017-05-27 MED ORDER — SODIUM CHLORIDE 0.9 % IV SOLN: 10.0000 mg | Freq: Once | INTRAVENOUS | Status: DC

## 2017-05-27 MED ORDER — HEPARIN SOD (PORK) LOCK FLUSH 100 UNIT/ML IV SOLN
500.0000 [IU] | Freq: Once | INTRAVENOUS | Status: AC | PRN
  Administered 2017-05-27: 500 [IU]
  Filled 2017-05-27: qty 5

## 2017-05-27 MED ORDER — DEXAMETHASONE SODIUM PHOSPHATE 10 MG/ML IJ SOLN
INTRAMUSCULAR | Status: AC
  Filled 2017-05-27: qty 1

## 2017-05-27 MED ORDER — SODIUM CHLORIDE 0.9 % IV SOLN
Freq: Once | INTRAVENOUS | Status: AC
  Administered 2017-05-27: 13:00:00 via INTRAVENOUS

## 2017-05-27 MED ORDER — PEGFILGRASTIM 6 MG/0.6ML ~~LOC~~ PSKT
6.0000 mg | PREFILLED_SYRINGE | Freq: Once | SUBCUTANEOUS | Status: AC
  Administered 2017-05-27: 6 mg via SUBCUTANEOUS
  Filled 2017-05-27: qty 0.6

## 2017-05-27 MED ORDER — DEXAMETHASONE SODIUM PHOSPHATE 10 MG/ML IJ SOLN
10.0000 mg | Freq: Once | INTRAMUSCULAR | Status: AC
  Administered 2017-05-27: 10 mg via INTRAVENOUS

## 2017-05-27 MED ORDER — SODIUM CHLORIDE 0.9 % IV SOLN
100.0000 mg/m2 | Freq: Once | INTRAVENOUS | Status: AC
  Administered 2017-05-27: 190 mg via INTRAVENOUS
  Filled 2017-05-27: qty 9.5

## 2017-05-27 NOTE — Progress Notes (Signed)
Received call from radiology that the order for patients MRI of brain needed to be changed to with and without contrast. She stated that was what the precert was for also. Order changed.

## 2017-05-27 NOTE — Progress Notes (Signed)
To treatment area for day 3 etoposide.  Good blood return noted from port before saline infusion started.  No complaints with port site.  Dressing intact.   Patient tolerated chemotherapy with no complaints voiced.  Port site clean and dry with no bruising or swelling noted at site.  Band aid applied.  Neulasta Onpro applied to right arm with green indicator light flashing and no complaints voiced from the patient.   VSS with discharge and left ambulatory with no s/s of distress noted.

## 2017-05-27 NOTE — Patient Instructions (Signed)
[place] Discharge Instructions for Patients Receiving Chemotherapy  Today you received the following chemotherapy agents etoposide.     If you develop nausea and vomiting that is not controlled by your nausea medication, call the clinic.   BELOW ARE SYMPTOMS THAT SHOULD BE REPORTED IMMEDIATELY:  *FEVER GREATER THAN 100.5 F  *CHILLS WITH OR WITHOUT FEVER  NAUSEA AND VOMITING THAT IS NOT CONTROLLED WITH YOUR NAUSEA MEDICATION  *UNUSUAL SHORTNESS OF BREATH  *UNUSUAL BRUISING OR BLEEDING  TENDERNESS IN MOUTH AND THROAT WITH OR WITHOUT PRESENCE OF ULCERS  *URINARY PROBLEMS  *BOWEL PROBLEMS  UNUSUAL RASH Items with * indicate a potential emergency and should be followed up as soon as possible.  Feel free to call the clinic should you have any questions or concerns. The clinic phone number is [phone].  Please show the [employer] at check-in to the Emergency Department and triage nurse.

## 2017-05-28 ENCOUNTER — Ambulatory Visit (HOSPITAL_COMMUNITY)
Admission: RE | Admit: 2017-05-28 | Discharge: 2017-05-28 | Disposition: A | Discharge status: Home / Self Care | Payer: Medicare PPO | Source: Ambulatory Visit | Attending: Adult Health | Admitting: Adult Health

## 2017-05-28 ENCOUNTER — Ambulatory Visit (HOSPITAL_COMMUNITY): Payer: Medicare PPO

## 2017-05-28 DIAGNOSIS — R9082 White matter disease, unspecified: Secondary | ICD-10-CM | POA: Diagnosis not present

## 2017-05-28 DIAGNOSIS — R42 Dizziness and giddiness: Secondary | ICD-10-CM | POA: Insufficient documentation

## 2017-05-28 DIAGNOSIS — R51 Headache: Secondary | ICD-10-CM | POA: Diagnosis not present

## 2017-05-28 DIAGNOSIS — M2548 Effusion, other site: Secondary | ICD-10-CM | POA: Diagnosis not present

## 2017-05-28 DIAGNOSIS — J392 Other diseases of pharynx: Secondary | ICD-10-CM | POA: Diagnosis not present

## 2017-05-28 DIAGNOSIS — J320 Chronic maxillary sinusitis: Secondary | ICD-10-CM | POA: Diagnosis not present

## 2017-05-28 DIAGNOSIS — C3490 Malignant neoplasm of unspecified part of unspecified bronchus or lung: Secondary | ICD-10-CM | POA: Diagnosis not present

## 2017-05-28 MED ORDER — GADOBENATE DIMEGLUMINE 529 MG/ML IV SOLN
20.0000 mL | Freq: Once | INTRAVENOUS | Status: AC | PRN
  Administered 2017-05-28: 15 mL via INTRAVENOUS

## 2017-06-12 ENCOUNTER — Encounter (HOSPITAL_COMMUNITY)
Admission: RE | Admit: 2017-06-12 | Discharge: 2017-06-12 | Disposition: A | Discharge status: Home / Self Care | Payer: Medicare PPO | Source: Ambulatory Visit | Attending: Adult Health | Admitting: Adult Health

## 2017-06-12 DIAGNOSIS — C3490 Malignant neoplasm of unspecified part of unspecified bronchus or lung: Secondary | ICD-10-CM | POA: Insufficient documentation

## 2017-06-12 LAB — GLUCOSE, CAPILLARY: Glucose-Capillary: 102 mg/dL — ABNORMAL HIGH (ref 65–99)

## 2017-06-12 MED ORDER — FLUDEOXYGLUCOSE F - 18 (FDG) INJECTION
8.9100 | Freq: Once | INTRAVENOUS | Status: AC | PRN
  Administered 2017-06-12: 8.91 via INTRAVENOUS

## 2017-06-15 ENCOUNTER — Other Ambulatory Visit: Payer: Self-pay | Admitting: Pharmacy Technician

## 2017-06-15 NOTE — Patient Outreach (Signed)
[place] [facility]) Care Management  06/15/2017  [name] 1950-12-17 [id number]  Successful outreach call to [name] in regards to Atorvastatin 10mg  adherence. Technician verified last filled and mailed out 11/30 for 90 days supply.  [name] [place], [place] [facility] [phone]

## 2017-06-16 ENCOUNTER — Encounter (HOSPITAL_COMMUNITY): Payer: Self-pay | Admitting: Oncology

## 2017-06-16 ENCOUNTER — Ambulatory Visit (HOSPITAL_COMMUNITY): Payer: Medicare PPO

## 2017-06-16 ENCOUNTER — Other Ambulatory Visit: Payer: Self-pay

## 2017-06-16 ENCOUNTER — Encounter (HOSPITAL_COMMUNITY): Payer: Medicare PPO | Attending: Oncology | Admitting: Oncology

## 2017-06-16 VITALS — BP 146/58 | HR 100 | Temp 98.0°F | Resp 18 | Ht 69.0 in | Wt 176.0 lb

## 2017-06-16 DIAGNOSIS — C787 Secondary malignant neoplasm of liver and intrahepatic bile duct: Secondary | ICD-10-CM | POA: Diagnosis not present

## 2017-06-16 DIAGNOSIS — C770 Secondary and unspecified malignant neoplasm of lymph nodes of head, face and neck: Secondary | ICD-10-CM

## 2017-06-16 DIAGNOSIS — C3490 Malignant neoplasm of unspecified part of unspecified bronchus or lung: Secondary | ICD-10-CM | POA: Insufficient documentation

## 2017-06-16 DIAGNOSIS — Z720 Tobacco use: Secondary | ICD-10-CM

## 2017-06-16 NOTE — Progress Notes (Signed)
Referred patient back to [facility] for brain radiation.  Records faxed to [phone].

## 2017-06-16 NOTE — Progress Notes (Signed)
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 Bulky 5.4 cm right supraclavicular region malignant lymph node conglomeration with extracapsular extension. 2. Surrounding smaller abnormal right level 3 and level 5 lymph nodes, and the lymphadenopathy continues into the superior mediastinum, see Chest CT findings reported separately. 3. No other metastatic disease identified in the neck.      01/16/2017 Imaging    CT chest: IMPRESSION: 1. Extensive lymphadenopathy in the thorax and lower right cervical region, as discussed above. Primary differential considerations include lymphoma/leukemia or small cell carcinoma of the lung. Further evaluation a PET-CT could be considered to assess for additional sites of disease below the diaphragm if clinically appropriate. Additionally, ultrasound-guided biopsy of supraclavicular lymphadenopathy could be considered to establish a tissue diagnosis. 2. Indeterminate lesion in the periphery of segment 8 of the liver measuring 2.7 x 1.7 cm. Attention at time of follow-up PET-CT is recommended. 3. Aortic atherosclerosis, in addition to left main and 3 vessel coronary artery disease. Please note that although the presence of coronary artery calcium documents the presence of coronary artery disease, the severity of this disease and any potential stenosis cannot be assessed on this non-gated CT examination. Assessment for potential risk factor modification, dietary therapy or pharmacologic therapy may be warranted, if clinically indicated. 4. There are  calcifications of the aortic valve. Echocardiographic correlation for evaluation of potential valvular dysfunction may be warranted if clinically indicated. 5. Diffuse bronchial wall thickening with moderate centrilobular and paraseptal emphysema; imaging findings suggestive of underlying COPD.      02/03/2017 Initial Biopsy    (R) neck lymph node biopsy: SMALL CELL CARCINOMA (most likely lung primary).       02/03/2017 Miscellaneous    Port-a-cath attempted by IR; unable to place d/t enlarged SVC.       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 Staging for metastatic disease  EXAM: MRI HEAD WITHOUT AND WITH CONTRAST  TECHNIQUE: Multiplanar, multiecho pulse sequences of the brain and surrounding structures were obtained without and with intravenous contrast.  CONTRAST:  60mL MULTIHANCE GADOBENATE DIMEGLUMINE 529 MG/ML IV SOLN  COMPARISON:  None.  FINDINGS: Brain: Negative for hydrocephalus. Cerebral volume normal for age. Small nonenhancing white matter hyperintensities consistent with mild chronic microvascular ischemia. No acute infarct. Negative for hemorrhage or mass or edema  Normal enhancement postcontrast infusion. No enhancing mass lesion. Leptomeningeal enhancement is normal.  Vascular: Normal arterial flow voids.  Normal venous enhancement  Skull and upper cervical spine: Negative  Sinuses/Orbits: Negative  Other: None  IMPRESSION: Negative for metastatic disease.  No acute abnormality.  Mild chronic white matter changes.      04/07/2017 Imaging     PET:  1. Marked reduction in size and metabolic activity of bulky RIGHT supraclavicular adenopathy mediastinal lymphadenopathy. 2. Residual moderate activity remains within  small RIGHT supraclavicular lymph node, RIGHT lower paratracheal lymph node and RIGHT hilar lymph node. 3. Resolution of prevascular and internal mammary mediastinal metastatic hypermetabolic activity. 4. Resolution of metabolic activity associated with solitary RIGHT hepatic lobe liver metastasis. 5. No evidence of disease progression. 6. 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      05/27/2017 Imaging    MRI brain w/ and w/o contrast IMPRESSION: 1. No metastatic disease identified. 2. Increased nonspecific cerebral white matter signal changes since August. These are most commonly small vessel disease related. 3. New right maxillary sinusitis. Benign appearing retention cysts in the nasopharynx with trace mastoid effusions.      06/12/2017 Imaging    PET-CT IMPRESSION: 1. There are two new hypermetabolic nodules identified within both lower lobes measuring up to 3.1 cm. The appearance is nonspecific and may be inflammatory/infectious in etiology. Pulmonary metastatic disease cannot be excluded and short-term follow-up imaging in 3 months is advised to reassess these nodules. 2. Stable appearance of mild hypermetabolic activity associated with right paratracheal and right hilar lymph nodes. 3. Decrease in FDG uptake associated with index right supraclavicular lymph node. 4. 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 5. Aortic Atherosclerosis (ICD10-I70.0) and Emphysema (ICD10-J43.9).         INTERVAL HISTORY:  [name] 66 y.o. male returns to cancer center for follow-up for extensive stage small cell lung cancer.   Patient states that he has been feeling tired. He states his appetite has decreased compared to before. He denies any chest pain, shortness of breath, abdominal pain.  He states he was having a productive cough with green sputum but now the sputum is white. No associated fevers/chills. He also states his hip has been giving out on him recently when he walks.  REVIEW OF SYSTEMS:  Review of Systems  Constitutional: Positive for fatigue. Negative for chills and fever.  HENT:  Negative.   Eyes: Negative.   Respiratory: Negative.  Negative for cough and shortness of breath.   Cardiovascular: Negative.  Negative for leg swelling.  Gastrointestinal: Negative for abdominal pain, blood in stool, constipation, diarrhea, nausea and vomiting.  Endocrine: Negative.   Musculoskeletal: Negative for arthralgias, back pain and neck pain.  Neurological: Negative for dizziness and headaches.  Hematological: Negative for adenopathy.  Psychiatric/Behavioral: Negative for sleep disturbance.     PAST MEDICAL/SURGICAL HISTORY:  Past Medical History:  Diagnosis Date  . Anxiety   . CAD (coronary artery disease)   . COPD (chronic obstructive pulmonary disease) ([place])   . Depression   . Myocardial infarction ([place])   . Osteopenia   . Panic attacks   . Smoker    Past Surgical History:  Procedure Laterality Date  . BACK SURGERY  12/24/2000   L5,S1  . CORONARY STENT PLACEMENT  2005   RCA & CX  . HERNIA REPAIR Right 1980's  . INGUINAL HERNIA REPAIR  12/1978   right side  . IR FLUORO GUIDE PORT INSERTION RIGHT  04/02/2017  . IR US GUIDE BX ASP/DRAIN  02/03/2017  . IR US GUIDE VASC ACCESS RIGHT  04/02/2017  . NM MYOCAR PERF WALL MOTION  09/07/2009   No ischemia; EF 51%  . SHOULDER SURGERY Left 08/2010  . SPINE SURGERY  2002   L5-S1     SOCIAL HISTORY:  Social History   Socioeconomic History  .  Marital status: Single    Spouse name: Not on file  . Number of children: Not on file  . Years of education: Not on file  . Highest education level: Not on file  Social Needs  . Financial resource strain: Not on file  . Food insecurity - worry: Not on file  . Food  insecurity - inability: Not on file  . Transportation needs - medical: Not on file  . Transportation needs - non-medical: Not on file  Occupational History  . Not on file  Tobacco Use  . Smoking status: Current Every Day Smoker    Packs/day: 1.00    Types: Cigarettes  . Smokeless tobacco: Never Used  Substance and Sexual Activity  . Alcohol use: No  . Drug use: No  . Sexual activity: Not on file  Other Topics Concern  . Not on file  Social History Narrative  . Not on file    FAMILY HISTORY:  Family History  Problem Relation Age of Onset  . Heart attack Father   . Kidney disease Father        renal failure  . Heart failure Mother   . Heart attack Mother   . Cancer Brother   . Diabetes Brother   . Alcohol abuse Brother   . Diabetes Sister     CURRENT MEDICATIONS:  Outpatient Encounter Medications as of 06/16/2017  Medication Sig  . ALPRAZolam (XANAX) 1 MG tablet Take 1 mg by mouth 4 (four) times daily.  Marland Kitchen atorvastatin (LIPITOR) 10 MG tablet TAKE 1 TABLET AT BEDTIME  (DISCONTINUE ATORVASTATIN 20MG )  . CISPLATIN IV Inject into the vein. Day 1 every 21 days  . clotrimazole-betamethasone (LOTRISONE) cream Apply 1 application topically 2 (two) times daily.  . cyclobenzaprine (FLEXERIL) 5 MG tablet Take 1 tablet (5 mg total) 3 (three) times daily as needed by mouth for muscle spasms.  Marland Kitchen dexamethasone (DECADRON) 4 MG tablet Take 1 tablet two times a day for 2 days after chemo, then stop until next cycle of chemo. Take medication with food.  . ETOPOSIDE IV Inject into the vein. Days 1,2,3 every 21 days  . HYDROcodone-acetaminophen (NORCO) 5-325 MG tablet Take 0.5-1 tablets every 6 (six) hours as needed by mouth for moderate pain.  Marland Kitchen lidocaine-prilocaine (EMLA) cream Apply to affected area once  . ondansetron (ZOFRAN) 8 MG tablet Take 1 tablet (8 mg total) 2 (two) times daily as needed by mouth. Start on the third day after cisplatin chemotherapy.  . Pegfilgrastim (NEULASTA ONPRO  [place]) Inject into the skin. Every 21 days  . polyethylene glycol powder (GLYCOLAX/MIRALAX) powder Take 17 g by mouth daily.  . prochlorperazine (COMPAZINE) 10 MG tablet Take 1 tablet (10 mg total) every 6 (six) hours as needed by mouth (Nausea or vomiting).  . traZODone (DESYREL) 100 MG tablet Take 400 mg by mouth at bedtime.  . [DISCONTINUED] cyclobenzaprine (FLEXERIL) 10 MG tablet TAKE 1 TABLET THREE TIMES DAILY AS NEEDED FOR MUSCLE SPASMS.   No facility-administered encounter medications on file as of 06/16/2017.     ALLERGIES:  Allergies  Allergen Reactions  . Codeine Nausea Only  . Niaspan [Niacin Er]      PHYSICAL EXAM:  ECOG Performance status: 1 - Symptomatic; remains independent   RN vitals reviewed.  Vitals:   06/16/17 0912  BP: (!) 146/58  Pulse: 100  Resp: 18  Temp: 98 F (36.7 C)  SpO2: 92%     Physical Exam  Constitutional: He is oriented to person, place,  and time and well-developed, well-nourished, and in no distress.     HENT:  Head: Normocephalic.  Mouth/Throat: Oropharynx is clear and moist. No oropharyngeal exudate.  Eyes: Conjunctivae are normal. Pupils are equal, round, and reactive to light. No scleral icterus.  Neck: Normal range of motion.  Cardiovascular: Normal rate and regular rhythm.  Pulmonary/Chest: Effort normal and breath sounds normal. No respiratory distress. He has no wheezes.  Abdominal: Soft. Bowel sounds are normal. There is no tenderness. There is no rebound.  Musculoskeletal: Normal range of motion. He exhibits no edema.  Neurological: He is alert and oriented to person, place, and time. No cranial nerve deficit. Gait normal.  Skin: Skin is warm. No pallor.  Psychiatric: Mood, memory, affect and judgment normal.  Nursing note and vitals reviewed.    LABORATORY DATA:  I have reviewed the labs as listed.  CBC    Component Value Date/Time   WBC 18.7 (H) 05/25/2017 0916   RBC 2.98 (L) 05/25/2017 0916   HGB 9.7 (L) 05/25/2017  0916   HCT 30.0 (L) 05/25/2017 0916   PLT 207 05/25/2017 0916   MCV 100.7 (H) 05/25/2017 0916   MCH 32.6 05/25/2017 0916   MCHC 32.3 05/25/2017 0916   RDW 21.3 (H) 05/25/2017 0916   LYMPHSABS 2.8 05/25/2017 0916   MONOABS 1.3 (H) 05/25/2017 0916   EOSABS 0.0 05/25/2017 0916   BASOSABS 0.0 05/25/2017 0916   CMP Latest Ref Rng & Units 05/25/2017 05/04/2017 04/13/2017  Glucose 65 - 99 mg/dL 169(H) 97 93  BUN 6 - 20 mg/dL 29(H) 30(H) 23(H)  Creatinine 0.61 - 1.24 mg/dL 0.91 0.83 0.94  Sodium 135 - 145 mmol/L 133(L) 134(L) 133(L)  Potassium 3.5 - 5.1 mmol/L 4.0 4.1 4.0  Chloride 101 - 111 mmol/L 101 103 99(L)  CO2 22 - 32 mmol/L 26 26 28   Calcium 8.9 - 10.3 mg/dL 8.6(L) 8.8(L) 8.8(L)  Total Protein 6.5 - 8.1 g/dL 5.7(L) 5.6(L) 6.2(L)  Total Bilirubin 0.3 - 1.2 mg/dL 0.3 0.3 0.5  Alkaline Phos 38 - 126 U/L 83 82 62  AST 15 - 41 U/L 21 17 16   ALT 17 - 63 U/L 36 28 22    PENDING LABS:    DIAGNOSTIC IMAGING:  *The following radiologic images and reports have been reviewed independently and agree with below findings.  CT neck: 01/16/17     CT chest: 01/16/17    PET scan: 02/02/17    MRI brain: 02/11/17 CLINICAL DATA:  Advanced stage small cell lung cancer. Staging for metastatic disease  EXAM: MRI HEAD WITHOUT AND WITH CONTRAST  TECHNIQUE: Multiplanar, multiecho pulse sequences of the brain and surrounding structures were obtained without and with intravenous contrast.  CONTRAST:  32mL MULTIHANCE GADOBENATE DIMEGLUMINE 529 MG/ML IV SOLN  COMPARISON:  None.  FINDINGS: Brain: Negative for hydrocephalus. Cerebral volume normal for age. Small nonenhancing white matter hyperintensities consistent with mild chronic microvascular ischemia. No acute infarct. Negative for hemorrhage or mass or edema  Normal enhancement postcontrast infusion. No enhancing mass lesion. Leptomeningeal enhancement is normal.  Vascular: Normal arterial flow voids.  Normal venous  enhancement  Skull and upper cervical spine: Negative  Sinuses/Orbits: Negative  Other: None  IMPRESSION: Negative for metastatic disease.  No acute abnormality.  Mild chronic white matter changes.   Electronically Signed   By: Franchot Gallo M.D.   On: 02/11/2017 15:35  Study Result   CLINICAL DATA:  Subsequent treatment strategy for small cell lung cancer. Stage IV. Patient status post chemotherapy (  2-3 cycles). Subsequent treatment evaluation.  EXAM: NUCLEAR MEDICINE PET SKULL BASE TO THIGH  TECHNIQUE: 9.4 mCi F-18 FDG was injected intravenously. Full-ring PET imaging was performed from the skull base to thigh after the radiotracer. CT data was obtained and used for attenuation correction and anatomic localization.  FASTING BLOOD GLUCOSE:  Value: 92 mg/dl  COMPARISON:  PET-CT scan 02/02/2017  FINDINGS: NECK  Persistent hypermetabolic focus within the RIGHT parotid gland with SUV max equal 7.5 not significant changed from prior. Associated Rounded nodule in the inferior aspect / tail of the parotid gland measuring 10 mm (image 28, series 4).  Marked reduction in size and metabolic activity of supraclavicular adenopathy. The size is markedly reduced with only residual 1.1 cm nodule (image 50, series 4). This compares to nodal mass of approximately 4 cm with individual lymph nodes measuring up to 2.5 cm. Metabolic activity is also significant reduce with SUV max equal 4.7 decreased from SUV max 17.8.  Resolution of the sub pectoralis hypermetabolic lymph nodes on the RIGHT.  Marked reduction in size and metabolic activity of paratracheal adenopathy. Nodal metabolic activity remains in RIGHT lower paratracheal lymph node measuring 11 mm short axis with SUV max equal 5.4 decreased from 30 mm short axis with SUV max 16.0. Residual activity in a small RIGHT hilar lymph node with SUV max equal 4.8.  There is complete resolution of the  prevascular hypermetabolic adenopathy as well as the LEFT internal mammary adenopathy.  CHEST  ABDOMEN/PELVIS  No clear residual metabolic activity associated with the isolated RIGHT hepatic lobe liver metastasis. Lesion also decreased in size measuring 5 mm decreased from 25 mm (image 126, series 4  No abnormal hypermetabolic activity within the liver, pancreas, adrenal glands, or spleen. No hypermetabolic lymph nodes in the abdomen or pelvis.  SKELETON  No focal hypermetabolic activity to suggest skeletal metastasis.  IMPRESSION: 1. Marked reduction in size and metabolic activity of bulky RIGHT supraclavicular adenopathy mediastinal lymphadenopathy. 2. Residual moderate activity remains within small RIGHT supraclavicular lymph node, RIGHT lower paratracheal lymph node and RIGHT hilar lymph node. 3. Resolution of prevascular and internal mammary mediastinal metastatic hypermetabolic activity. 4. Resolution of metabolic activity associated with solitary RIGHT hepatic lobe liver metastasis. 5. No evidence of disease progression. 6. 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     PATHOLOGY:  (R) neck lymph node biopsy: 02/03/17          ASSESSMENT & PLAN:   Extensive-stage small cell lung cancer with liver mets:  -Initially presented to his PCP in early 01/2017 with 3-week history of right neck mass.  CT chest performed on 01/16/17 revealed extensive lymphadenopathy in chest and lower right cervical lymph node area; also indeterminate lesion in liver.  CT neck also performed on 01/16/17 also showed bulky (R) supraclavicular malignant lymph node measuring 5.4 cm, with several abnormal level 3 & 4 cervical lymph nodes and lymphadenopathy to superior mediastinum. IR attempted to place port-a-cath for anticipated chemotherapy; they were unable to place port d/t enlarged SVC and aborted  procedure d/t concerns for patient safety.  Biopsy of (R) neck mass revealed small cell carcinoma, most consistent with a lung primary.   PET scan does reveal hypermetabolic liver lesion, as well as multiple sites of hypermetabolic lymphadenopathy. He understands that his cancer is treatable, but not curable.  Completed 6 cycles of cisplatin+ etoposide on 05/27/17.  -Reviewed his PET scan results with him in detail  and gave him a copy of his PET. He has two new hypermetabolic nodules identified within both lower lobes measuring up to 3.1 cm. The appearance is nonspecific and may be inflammatory/infectious in etiology. I have ordered a short interval PET CT to be done in 2 months. -MRI brain on 05/28/17 negative for metastatic disease. Refer back to rad-onc for evaluation for PCI.   Tobacco use disorder:  -Continue efforts on smoke cessation.  Dispo: RTC in 2 months for follow up.    All questions were answered to patient's stated satisfaction. Encouraged patient to call with any new concerns or questions before his next visit to the cancer center and we can certain see him sooner, if needed.    Twana First, MD

## 2017-06-17 ENCOUNTER — Telehealth: Payer: Self-pay | Admitting: Family Medicine

## 2017-06-17 ENCOUNTER — Other Ambulatory Visit: Payer: Self-pay | Admitting: Radiology

## 2017-06-17 ENCOUNTER — Ambulatory Visit (INDEPENDENT_AMBULATORY_CARE_PROVIDER_SITE_OTHER): Payer: Medicare PPO

## 2017-06-17 DIAGNOSIS — Z23 Encounter for immunization: Secondary | ICD-10-CM | POA: Diagnosis not present

## 2017-06-17 NOTE — Progress Notes (Signed)
Patient was in office to receive flu vaccine.Patient received vaccine in right deltoid.patietn tolerated well

## 2017-06-17 NOTE — Telephone Encounter (Signed)
Pt needs refill on xanax.

## 2017-06-17 NOTE — Telephone Encounter (Signed)
Ok to refill 

## 2017-06-18 ENCOUNTER — Encounter (HOSPITAL_COMMUNITY): Payer: Self-pay

## 2017-06-18 ENCOUNTER — Ambulatory Visit (HOSPITAL_COMMUNITY)
Admission: RE | Admit: 2017-06-18 | Discharge: 2017-06-18 | Disposition: A | Discharge status: Home / Self Care | Payer: Medicare PPO | Source: Ambulatory Visit | Attending: Oncology | Admitting: Oncology

## 2017-06-18 DIAGNOSIS — I2510 Atherosclerotic heart disease of native coronary artery without angina pectoris: Secondary | ICD-10-CM | POA: Diagnosis not present

## 2017-06-18 DIAGNOSIS — Z79899 Other long term (current) drug therapy: Secondary | ICD-10-CM | POA: Insufficient documentation

## 2017-06-18 DIAGNOSIS — I252 Old myocardial infarction: Secondary | ICD-10-CM | POA: Diagnosis not present

## 2017-06-18 DIAGNOSIS — F329 Major depressive disorder, single episode, unspecified: Secondary | ICD-10-CM | POA: Diagnosis not present

## 2017-06-18 DIAGNOSIS — J449 Chronic obstructive pulmonary disease, unspecified: Secondary | ICD-10-CM | POA: Insufficient documentation

## 2017-06-18 DIAGNOSIS — D110 Benign neoplasm of parotid gland: Secondary | ICD-10-CM | POA: Diagnosis not present

## 2017-06-18 DIAGNOSIS — K118 Other diseases of salivary glands: Secondary | ICD-10-CM | POA: Diagnosis not present

## 2017-06-18 DIAGNOSIS — Z9221 Personal history of antineoplastic chemotherapy: Secondary | ICD-10-CM | POA: Diagnosis not present

## 2017-06-18 DIAGNOSIS — F419 Anxiety disorder, unspecified: Secondary | ICD-10-CM | POA: Diagnosis not present

## 2017-06-18 DIAGNOSIS — F17210 Nicotine dependence, cigarettes, uncomplicated: Secondary | ICD-10-CM | POA: Insufficient documentation

## 2017-06-18 DIAGNOSIS — C3490 Malignant neoplasm of unspecified part of unspecified bronchus or lung: Secondary | ICD-10-CM | POA: Diagnosis not present

## 2017-06-18 DIAGNOSIS — M8580 Other specified disorders of bone density and structure, unspecified site: Secondary | ICD-10-CM | POA: Diagnosis not present

## 2017-06-18 HISTORY — DX: Malignant (primary) neoplasm, unspecified: C80.1

## 2017-06-18 HISTORY — DX: Palpitations: R00.2

## 2017-06-18 HISTORY — DX: Dyspnea, unspecified: R06.00

## 2017-06-18 HISTORY — DX: Other chest pain: R07.89

## 2017-06-18 HISTORY — DX: Personal history of antineoplastic chemotherapy: Z92.21

## 2017-06-18 LAB — CBC WITH DIFFERENTIAL/PLATELET
Basophils Absolute: 0 10*3/uL (ref 0.0–0.1)
Basophils Relative: 0 %
Eosinophils Absolute: 0 10*3/uL (ref 0.0–0.7)
Eosinophils Relative: 0 %
HCT: 29.6 % — ABNORMAL LOW (ref 39.0–52.0)
Hemoglobin: 10 g/dL — ABNORMAL LOW (ref 13.0–17.0)
Lymphocytes Relative: 10 %
Lymphs Abs: 1 10*3/uL (ref 0.7–4.0)
MCH: 34 pg (ref 26.0–34.0)
MCHC: 33.8 g/dL (ref 30.0–36.0)
MCV: 100.7 fL — ABNORMAL HIGH (ref 78.0–100.0)
Monocytes Absolute: 1.1 10*3/uL — ABNORMAL HIGH (ref 0.1–1.0)
Monocytes Relative: 11 %
Neutro Abs: 7.4 10*3/uL (ref 1.7–7.7)
Neutrophils Relative %: 79 %
Platelets: 270 10*3/uL (ref 150–400)
RBC: 2.94 MIL/uL — ABNORMAL LOW (ref 4.22–5.81)
RDW: 19.2 % — ABNORMAL HIGH (ref 11.5–15.5)
WBC: 9.6 10*3/uL (ref 4.0–10.5)

## 2017-06-18 LAB — PROTIME-INR
INR: 1.08
Prothrombin Time: 13.9 seconds (ref 11.4–15.2)

## 2017-06-18 MED ORDER — SODIUM CHLORIDE 0.9 % IV SOLN
INTRAVENOUS | Status: DC
  Administered 2017-06-18: 12:00:00 via INTRAVENOUS

## 2017-06-18 MED ORDER — FENTANYL CITRATE (PF) 100 MCG/2ML IJ SOLN
INTRAMUSCULAR | Status: AC
  Filled 2017-06-18: qty 2

## 2017-06-18 MED ORDER — HEPARIN SOD (PORK) LOCK FLUSH 100 UNIT/ML IV SOLN
500.0000 [IU] | INTRAVENOUS | Status: DC
  Administered 2017-06-18: 500 [IU]

## 2017-06-18 MED ORDER — HYDROCODONE-ACETAMINOPHEN 5-325 MG PO TABS: 1.0000 | ORAL_TABLET | ORAL | Status: DC | PRN

## 2017-06-18 MED ORDER — SODIUM CHLORIDE 0.9% FLUSH: 10.0000 mL | Freq: Two times a day (BID) | INTRAVENOUS | Status: DC

## 2017-06-18 MED ORDER — MIDAZOLAM HCL 2 MG/2ML IJ SOLN
INTRAMUSCULAR | Status: AC
  Filled 2017-06-18: qty 2

## 2017-06-18 MED ORDER — ALPRAZOLAM 1 MG PO TABS: 1.0000 mg | ORAL_TABLET | Freq: Four times a day (QID) | ORAL | 2 refills | Status: DC

## 2017-06-18 MED ORDER — LIDOCAINE HCL 1 % IJ SOLN
INTRAMUSCULAR | Status: AC
  Filled 2017-06-18: qty 20

## 2017-06-18 MED ORDER — SODIUM CHLORIDE 0.9% FLUSH
10.0000 mL | INTRAVENOUS | Status: DC | PRN
  Administered 2017-06-18: 10 mL
  Filled 2017-06-18: qty 40

## 2017-06-18 MED ORDER — HEPARIN SOD (PORK) LOCK FLUSH 100 UNIT/ML IV SOLN
500.0000 [IU] | INTRAVENOUS | Status: DC | PRN
  Filled 2017-06-18: qty 5

## 2017-06-18 NOTE — Addendum Note (Signed)
Addended by: Shary Decamp B on: 06/18/2017 09:21 AM   Modules accepted: Orders

## 2017-06-18 NOTE — Discharge Instructions (Signed)
Moderate Conscious Sedation, Adult, Care After These instructions provide you with information about caring for yourself after your procedure. Your health care provider may also give you more specific instructions. Your treatment has been planned according to current medical practices, but problems sometimes occur. Call your health care provider if you have any problems or questions after your procedure. What can I expect after the procedure? After your procedure, it is common:  To feel sleepy for several hours.  To feel clumsy and have poor balance for several hours.  To have poor judgment for several hours.  To vomit if you eat too soon.  Follow these instructions at home: For at least 24 hours after the procedure:   Do not: ? Participate in activities where you could fall or become injured. ? Drive. ? Use heavy machinery. ? Drink alcohol. ? Take sleeping pills or medicines that cause drowsiness. ? Make important decisions or sign legal documents. ? Take care of children on your own.  Rest. Eating and drinking  Follow the diet recommended by your health care provider.  If you vomit: ? Drink water, juice, or soup when you can drink without vomiting. ? Make sure you have little or no nausea before eating solid foods. General instructions  Have a responsible adult stay with you until you are awake and alert.  Take over-the-counter and prescription medicines only as told by your health care provider.  If you smoke, do not smoke without supervision.  Keep all follow-up visits as told by your health care provider. This is important. Contact a health care provider if:  You keep feeling nauseous or you keep vomiting.  You feel light-headed.  You develop a rash.  You have a fever. Get help right away if:  You have trouble breathing. This information is not intended to replace advice given to you by your health care provider. Make sure you discuss any questions you have  with your health care provider. Document Released: 04/20/2013 Document Revised: 12/03/2015 Document Reviewed: 10/20/2015 Elsevier Interactive Patient Education  2018 [employer]. Biopsy Discharge Instructions  The procedure you just had is called a biopsy.  You may feel some discomfort after the local anesthetic wears off.  Your discomfort should improve over the next several days.  AFTER YOUR BIOPSY  Rest for the remainder of the day.  Avoid heavy lifting (more than 10 lb/4.5 kg).  If you have been given a general anesthetic or other medications to help you relax, you should not operate machinery, drive or make legal decisions for 24 hours after your procedure.  Additionally, someone must be available to drive you home.  Only take over-the-counter or prescription medicines for pain, discomfort, or fever as directed by your caregiver.  This can make bleeding worse.  You may resume your usual diet after the procedure.  Avoid alcoholic beverages for 24 hours after your procedure.  Keep the skin around your biopsy site clean and dry.  You may shower after 24 hours.  Cleanse and dry the biopsy site completely after you shower.  Avoid baths and swimming for 72 hours.  Complications are very uncommon after this procedure.  Go to the nearest Emergency Department or contact your caregiver if you develop any of the following symptoms:  Worsening pain  Bleeding  Swelling at the biopsy site  Light headedness or dizziness  Shortness of Breath  Fever or chills  Redness or increased pain or swelling at the biopsy site

## 2017-06-18 NOTE — Procedures (Signed)
Interventional Radiology Procedure Note  Procedure: US guided biopsy of right parotid nodule  Complications: None  Estimated Blood Loss: None  Recommendations: - DC Home  Signed,  Criselda Peaches, MD

## 2017-06-18 NOTE — H&P (Signed)
Referring Physician(s): Zhou,Louise  Supervising Physician: Jacqulynn Cadet  Patient Status:  [facility]  Chief Complaint: "I'm having a biopsy"   Subjective: Patient familiar to IR service from right neck mass/nodal biopsy on 02/03/17 which revealed met small cell lung cancer as well as right Port-A-Cath placement on 04/03/17.  He is status post chemotherapy.  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  He presents today for ultrasound-guided biopsy of the right parotid lesion for further evaluation.  He currently denies fever, headache, abdominal pain, nausea, vomiting; he does have some dyspnea with exertion, occasional cough, occasional chest tightness, hemorrhoidal bleeding occasionally; he is somewhat anxious.  He continues to smoke. Past Medical History:  Diagnosis Date  . Anxiety   . CAD (coronary artery disease)   . Cancer ([place])    stage 4 small cell lung cancer   . COPD (chronic obstructive pulmonary disease) ([place])   . Depression   . Dyspnea    increased exertion  . Feeling of chest tightness   . Heart palpitations   . History of chemotherapy   . Myocardial infarction ([place])   . Osteopenia   . Panic attacks   . Smoker    Past Surgical History:  Procedure Laterality Date  . BACK SURGERY  12/24/2000   L5,S1  . CORONARY STENT PLACEMENT  2005   RCA & CX  . HERNIA REPAIR Right 1980's  . INGUINAL HERNIA REPAIR  12/1978   right side  . IR FLUORO GUIDE PORT INSERTION RIGHT  04/02/2017  . IR US GUIDE BX ASP/DRAIN  02/03/2017  . IR US GUIDE VASC ACCESS RIGHT  04/02/2017  . NM MYOCAR PERF WALL MOTION  09/07/2009   No ischemia; EF 51%  . SHOULDER SURGERY Left 08/2010  . SPINE SURGERY  2002   L5-S1      Allergies: Codeine and Niaspan [niacin er]  Medications: Prior to Admission medications   Medication Sig Start Date End Date Taking? Authorizing Provider  ALPRAZolam Duanne Moron) 1 MG  tablet Take 1 tablet (1 mg total) by mouth 4 (four) times daily. 06/18/17  Yes [name], MD  atorvastatin (LIPITOR) 10 MG tablet TAKE 1 TABLET AT BEDTIME  (DISCONTINUE ATORVASTATIN 20MG) 06/20/16  Yes Croitoru, Mihai, MD  cyclobenzaprine (FLEXERIL) 5 MG tablet Take 1 tablet (5 mg total) 3 (three) times daily as needed by mouth for muscle spasms. 05/19/17  Yes [name], MD  dexamethasone (DECADRON) 4 MG tablet Take 1 tablet two times a day for 2 days after chemo, then stop until next cycle of chemo. Take medication with food. 05/26/17  Yes Holley Bouche, NP  ETOPOSIDE IV Inject into the vein. Days 1,2,3 every 21 days   Yes [provider]  HYDROcodone-acetaminophen (NORCO) 5-325 MG tablet Take 0.5-1 tablets every 6 (six) hours as needed by mouth for moderate pain. 05/26/17  Yes Holley Bouche, NP  lidocaine-prilocaine (EMLA) cream Apply to affected area once 02/09/17  Yes Twana First, MD  ondansetron (ZOFRAN) 8 MG tablet Take 1 tablet (8 mg total) 2 (two) times daily as needed by mouth. Start on the third day after cisplatin chemotherapy. 05/19/17  Yes Twana First, MD  Pegfilgrastim (NEULASTA ONPRO [place]) Inject into the skin. Every 21 days   Yes [provider]  polyethylene glycol powder (GLYCOLAX/MIRALAX) powder Take 17 g by mouth daily. 02/09/17  Yes Twana First, MD  prochlorperazine (COMPAZINE) 10 MG tablet Take  1 tablet (10 mg total) every 6 (six) hours as needed by mouth (Nausea or vomiting). 05/19/17  Yes Twana First, MD  traZODone (DESYREL) 100 MG tablet Take 400 mg by mouth at bedtime. 12/28/13  Yes [provider]  CISPLATIN IV Inject into the vein. Day 1 every 21 days    [provider]  clotrimazole-betamethasone (LOTRISONE) cream Apply 1 application topically 2 (two) times daily. 03/18/16   [name], MD     Vital Signs: BP (!) 160/66 (BP Location: Left Arm)   Pulse 93   Temp 97.9 F (36.6 C) (Oral)   Resp 18   Ht 5' 9"   (1.753 m)   Wt 172 lb (78 kg)   SpO2 95%   BMI 25.40 kg/m   Physical Exam awake, alert.  Chest clear to auscultation bilaterally.  Clean, intact right chest wall Port-A-Cath.  Heart with regular rate and rhythm.  Abdomen soft, positive bowel sounds, nontender.  Bilateral lower extremity edema noted.  Imaging: No results found.  Labs:  CBC: Recent Labs    04/02/17 0753 04/13/17 0907 05/04/17 0910 05/25/17 0916  WBC 7.3 9.4 18.2* 18.7*  HGB 10.4* 12.5* 11.1* 9.7*  HCT 29.4* 37.2* 32.9* 30.0*  PLT 145* 210 183 207    COAGS: Recent Labs    02/03/17 1114 04/02/17 0753  INR 1.11 1.03  APTT 30 23*    BMP: Recent Labs    04/02/17 0753 04/13/17 0907 05/04/17 0910 05/25/17 0916  NA 137 133* 134* 133*  K 3.7 4.0 4.1 4.0  CL 103 99* 103 101  CO2 25 28 26 26   GLUCOSE 87 93 97 169*  BUN 22* 23* 30* 29*  CALCIUM 9.0 8.8* 8.8* 8.6*  CREATININE 0.85 0.94 0.83 0.91  GFRNONAA >60 >60 >60 >60  GFRAA >60 >60 >60 >60    LIVER FUNCTION TESTS: Recent Labs    03/23/17 0810 04/13/17 0907 05/04/17 0910 05/25/17 0916  BILITOT 0.4 0.5 0.3 0.3  AST 18 16 17 21   ALT 20 22 28  36  ALKPHOS 67 62 82 83  PROT 6.5 6.2* 5.6* 5.7*  ALBUMIN 3.5 3.4* 3.3* 3.3*    Assessment and Plan: Patient with history of metastatic small cell lung cancer, status post chemotherapy.  Recent follow-up PET scan has revealed hypermetabolic right parotid lesion; he presents today for ultrasound-guided biopsy of the right parotid lesion for further evaluation if identified .Risks and benefits discussed with the patient/brother including, but not limited to bleeding, infection, damage to adjacent structures or low yield requiring additional tests.All of the patient's questions were answered, patient is agreeable to proceed.Consent signed and in chart.     Electronically Signed: D. Rowe Robert, PA-C 06/18/2017, 11:47 AM   I spent a total of 20 minutes at the the patient's bedside AND on the patient's  hospital floor or unit, greater than 50% of which was counseling/coordinating care for image guided right parotid lesion biopsy

## 2017-06-18 NOTE — Telephone Encounter (Signed)
Medication called/sent to requested pharmacy and pt aware 

## 2017-06-18 NOTE — Telephone Encounter (Signed)
Sure, rf x 2

## 2017-06-30 DIAGNOSIS — C3411 Malignant neoplasm of upper lobe, right bronchus or lung: Secondary | ICD-10-CM | POA: Diagnosis not present

## 2017-06-30 DIAGNOSIS — R590 Localized enlarged lymph nodes: Secondary | ICD-10-CM | POA: Diagnosis not present

## 2017-06-30 DIAGNOSIS — C7931 Secondary malignant neoplasm of brain: Secondary | ICD-10-CM | POA: Diagnosis not present

## 2017-07-09 DIAGNOSIS — C3411 Malignant neoplasm of upper lobe, right bronchus or lung: Secondary | ICD-10-CM | POA: Diagnosis not present

## 2017-07-09 DIAGNOSIS — C7931 Secondary malignant neoplasm of brain: Secondary | ICD-10-CM | POA: Diagnosis not present

## 2017-07-09 DIAGNOSIS — R590 Localized enlarged lymph nodes: Secondary | ICD-10-CM | POA: Diagnosis not present

## 2017-07-10 DIAGNOSIS — R590 Localized enlarged lymph nodes: Secondary | ICD-10-CM | POA: Diagnosis not present

## 2017-07-10 DIAGNOSIS — C7931 Secondary malignant neoplasm of brain: Secondary | ICD-10-CM | POA: Diagnosis not present

## 2017-07-10 DIAGNOSIS — C3411 Malignant neoplasm of upper lobe, right bronchus or lung: Secondary | ICD-10-CM | POA: Diagnosis not present

## 2017-07-16 DIAGNOSIS — R590 Localized enlarged lymph nodes: Secondary | ICD-10-CM | POA: Diagnosis not present

## 2017-07-16 DIAGNOSIS — C7931 Secondary malignant neoplasm of brain: Secondary | ICD-10-CM | POA: Diagnosis not present

## 2017-07-16 DIAGNOSIS — C3411 Malignant neoplasm of upper lobe, right bronchus or lung: Secondary | ICD-10-CM | POA: Diagnosis not present

## 2017-07-20 DIAGNOSIS — C7931 Secondary malignant neoplasm of brain: Secondary | ICD-10-CM | POA: Diagnosis not present

## 2017-07-20 DIAGNOSIS — R590 Localized enlarged lymph nodes: Secondary | ICD-10-CM | POA: Diagnosis not present

## 2017-07-20 DIAGNOSIS — C3411 Malignant neoplasm of upper lobe, right bronchus or lung: Secondary | ICD-10-CM | POA: Diagnosis not present

## 2017-07-21 DIAGNOSIS — R590 Localized enlarged lymph nodes: Secondary | ICD-10-CM | POA: Diagnosis not present

## 2017-07-21 DIAGNOSIS — C3411 Malignant neoplasm of upper lobe, right bronchus or lung: Secondary | ICD-10-CM | POA: Diagnosis not present

## 2017-07-22 DIAGNOSIS — C3411 Malignant neoplasm of upper lobe, right bronchus or lung: Secondary | ICD-10-CM | POA: Diagnosis not present

## 2017-07-22 DIAGNOSIS — R590 Localized enlarged lymph nodes: Secondary | ICD-10-CM | POA: Diagnosis not present

## 2017-07-22 DIAGNOSIS — C3490 Malignant neoplasm of unspecified part of unspecified bronchus or lung: Secondary | ICD-10-CM | POA: Diagnosis not present

## 2017-07-23 DIAGNOSIS — R590 Localized enlarged lymph nodes: Secondary | ICD-10-CM | POA: Diagnosis not present

## 2017-07-23 DIAGNOSIS — C3411 Malignant neoplasm of upper lobe, right bronchus or lung: Secondary | ICD-10-CM | POA: Diagnosis not present

## 2017-07-24 DIAGNOSIS — R590 Localized enlarged lymph nodes: Secondary | ICD-10-CM | POA: Diagnosis not present

## 2017-07-24 DIAGNOSIS — C3411 Malignant neoplasm of upper lobe, right bronchus or lung: Secondary | ICD-10-CM | POA: Diagnosis not present

## 2017-07-28 DIAGNOSIS — R590 Localized enlarged lymph nodes: Secondary | ICD-10-CM | POA: Diagnosis not present

## 2017-07-28 DIAGNOSIS — C7931 Secondary malignant neoplasm of brain: Secondary | ICD-10-CM | POA: Diagnosis not present

## 2017-07-28 DIAGNOSIS — C3411 Malignant neoplasm of upper lobe, right bronchus or lung: Secondary | ICD-10-CM | POA: Diagnosis not present

## 2017-07-29 DIAGNOSIS — R590 Localized enlarged lymph nodes: Secondary | ICD-10-CM | POA: Diagnosis not present

## 2017-07-29 DIAGNOSIS — C3411 Malignant neoplasm of upper lobe, right bronchus or lung: Secondary | ICD-10-CM | POA: Diagnosis not present

## 2017-07-30 DIAGNOSIS — R590 Localized enlarged lymph nodes: Secondary | ICD-10-CM | POA: Diagnosis not present

## 2017-07-30 DIAGNOSIS — C3411 Malignant neoplasm of upper lobe, right bronchus or lung: Secondary | ICD-10-CM | POA: Diagnosis not present

## 2017-07-31 DIAGNOSIS — R590 Localized enlarged lymph nodes: Secondary | ICD-10-CM | POA: Diagnosis not present

## 2017-07-31 DIAGNOSIS — C3411 Malignant neoplasm of upper lobe, right bronchus or lung: Secondary | ICD-10-CM | POA: Diagnosis not present

## 2017-08-04 DIAGNOSIS — C3411 Malignant neoplasm of upper lobe, right bronchus or lung: Secondary | ICD-10-CM | POA: Diagnosis not present

## 2017-08-04 DIAGNOSIS — R590 Localized enlarged lymph nodes: Secondary | ICD-10-CM | POA: Diagnosis not present

## 2017-08-12 ENCOUNTER — Inpatient Hospital Stay (HOSPITAL_COMMUNITY): Payer: Medicare PPO | Attending: Oncology

## 2017-08-12 DIAGNOSIS — C3490 Malignant neoplasm of unspecified part of unspecified bronchus or lung: Secondary | ICD-10-CM | POA: Insufficient documentation

## 2017-08-12 LAB — CBC WITH DIFFERENTIAL/PLATELET
Basophils Absolute: 0 10*3/uL (ref 0.0–0.1)
Basophils Relative: 0 %
Eosinophils Absolute: 0.2 10*3/uL (ref 0.0–0.7)
Eosinophils Relative: 2 %
HCT: 44 % (ref 39.0–52.0)
Hemoglobin: 14.3 g/dL (ref 13.0–17.0)
Lymphocytes Relative: 15 %
Lymphs Abs: 1.5 10*3/uL (ref 0.7–4.0)
MCH: 31.3 pg (ref 26.0–34.0)
MCHC: 32.5 g/dL (ref 30.0–36.0)
MCV: 96.3 fL (ref 78.0–100.0)
Monocytes Absolute: 0.8 10*3/uL (ref 0.1–1.0)
Monocytes Relative: 7 %
Neutro Abs: 7.7 10*3/uL (ref 1.7–7.7)
Neutrophils Relative %: 76 %
Platelets: 157 10*3/uL (ref 150–400)
RBC: 4.57 MIL/uL (ref 4.22–5.81)
RDW: 16.1 % — ABNORMAL HIGH (ref 11.5–15.5)
WBC: 10.1 10*3/uL (ref 4.0–10.5)

## 2017-08-12 LAB — COMPREHENSIVE METABOLIC PANEL
ALT: 20 U/L (ref 17–63)
AST: 22 U/L (ref 15–41)
Albumin: 3.9 g/dL (ref 3.5–5.0)
Alkaline Phosphatase: 55 U/L (ref 38–126)
Anion gap: 9 (ref 5–15)
BUN: 36 mg/dL — ABNORMAL HIGH (ref 6–20)
CO2: 24 mmol/L (ref 22–32)
Calcium: 9.1 mg/dL (ref 8.9–10.3)
Chloride: 99 mmol/L — ABNORMAL LOW (ref 101–111)
Creatinine, Ser: 1 mg/dL (ref 0.61–1.24)
GFR calc Af Amer: >60 mL/min (ref >60)
GFR calc non Af Amer: >60 mL/min (ref >60)
Glucose, Bld: 108 mg/dL — ABNORMAL HIGH (ref 65–99)
Potassium: 4.2 mmol/L (ref 3.5–5.1)
Sodium: 132 mmol/L — ABNORMAL LOW (ref 135–145)
Total Bilirubin: 0.6 mg/dL (ref 0.3–1.2)
Total Protein: 6.9 g/dL (ref 6.5–8.1)

## 2017-08-13 ENCOUNTER — Encounter (HOSPITAL_COMMUNITY)
Admission: RE | Admit: 2017-08-13 | Discharge: 2017-08-13 | Disposition: A | Discharge status: Home / Self Care | Payer: Medicare PPO | Source: Ambulatory Visit | Attending: Oncology | Admitting: Oncology

## 2017-08-13 DIAGNOSIS — C3490 Malignant neoplasm of unspecified part of unspecified bronchus or lung: Secondary | ICD-10-CM | POA: Insufficient documentation

## 2017-08-13 LAB — GLUCOSE, CAPILLARY: Glucose-Capillary: 96 mg/dL (ref 65–99)

## 2017-08-13 MED ORDER — FLUDEOXYGLUCOSE F - 18 (FDG) INJECTION
8.5000 | Freq: Once | INTRAVENOUS | Status: AC | PRN
  Administered 2017-08-13: 8.5 via INTRAVENOUS

## 2017-08-17 ENCOUNTER — Encounter (HOSPITAL_COMMUNITY): Payer: Self-pay | Admitting: Oncology

## 2017-08-17 ENCOUNTER — Inpatient Hospital Stay (HOSPITAL_COMMUNITY): Payer: Medicare PPO | Attending: Oncology | Admitting: Oncology

## 2017-08-17 ENCOUNTER — Other Ambulatory Visit: Payer: Self-pay

## 2017-08-17 DIAGNOSIS — M25512 Pain in left shoulder: Secondary | ICD-10-CM | POA: Insufficient documentation

## 2017-08-17 DIAGNOSIS — R918 Other nonspecific abnormal finding of lung field: Secondary | ICD-10-CM | POA: Diagnosis not present

## 2017-08-17 DIAGNOSIS — R42 Dizziness and giddiness: Secondary | ICD-10-CM | POA: Insufficient documentation

## 2017-08-17 DIAGNOSIS — R197 Diarrhea, unspecified: Secondary | ICD-10-CM | POA: Diagnosis not present

## 2017-08-17 DIAGNOSIS — C3490 Malignant neoplasm of unspecified part of unspecified bronchus or lung: Secondary | ICD-10-CM | POA: Insufficient documentation

## 2017-08-17 DIAGNOSIS — M25552 Pain in left hip: Secondary | ICD-10-CM | POA: Insufficient documentation

## 2017-08-17 DIAGNOSIS — M25551 Pain in right hip: Secondary | ICD-10-CM | POA: Diagnosis not present

## 2017-08-17 DIAGNOSIS — G893 Neoplasm related pain (acute) (chronic): Secondary | ICD-10-CM

## 2017-08-17 DIAGNOSIS — G629 Polyneuropathy, unspecified: Secondary | ICD-10-CM | POA: Insufficient documentation

## 2017-08-17 DIAGNOSIS — C787 Secondary malignant neoplasm of liver and intrahepatic bile duct: Secondary | ICD-10-CM | POA: Diagnosis not present

## 2017-08-17 DIAGNOSIS — K5900 Constipation, unspecified: Secondary | ICD-10-CM | POA: Insufficient documentation

## 2017-08-17 DIAGNOSIS — M549 Dorsalgia, unspecified: Secondary | ICD-10-CM | POA: Diagnosis not present

## 2017-08-17 DIAGNOSIS — F17210 Nicotine dependence, cigarettes, uncomplicated: Secondary | ICD-10-CM | POA: Diagnosis not present

## 2017-08-17 DIAGNOSIS — Z5111 Encounter for antineoplastic chemotherapy: Secondary | ICD-10-CM | POA: Diagnosis not present

## 2017-08-17 DIAGNOSIS — M25511 Pain in right shoulder: Secondary | ICD-10-CM | POA: Insufficient documentation

## 2017-08-17 DIAGNOSIS — C770 Secondary and unspecified malignant neoplasm of lymph nodes of head, face and neck: Secondary | ICD-10-CM | POA: Diagnosis not present

## 2017-08-17 DIAGNOSIS — Z5189 Encounter for other specified aftercare: Secondary | ICD-10-CM | POA: Insufficient documentation

## 2017-08-17 NOTE — Progress Notes (Signed)
[place], [place] [address]   CLINIC:  Medical Oncology/Hematology  PCP:  [name], MD [address] [facility] 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 Bulky 5.4 cm right supraclavicular region malignant lymph node conglomeration with extracapsular extension. 2. Surrounding smaller abnormal right level 3 and level 5 lymph nodes, and the lymphadenopathy continues into the superior mediastinum, see Chest CT findings reported separately. 3. No other metastatic disease identified in the neck.      01/16/2017 Imaging    CT chest: IMPRESSION: 1. Extensive lymphadenopathy in the thorax and lower right cervical region, as discussed above. Primary differential considerations include lymphoma/leukemia or small cell carcinoma of the lung. Further evaluation a PET-CT could be considered to assess for additional sites of disease below the diaphragm if clinically appropriate. Additionally, ultrasound-guided biopsy of supraclavicular lymphadenopathy could be considered to establish a tissue diagnosis. 2. Indeterminate lesion in the periphery of segment 8 of the liver measuring 2.7 x 1.7 cm. Attention at time of follow-up PET-CT is recommended. 3. Aortic atherosclerosis, in addition to left main and 3 vessel coronary artery disease. Please note that although the presence of coronary artery calcium documents the presence of coronary artery disease, the severity of this disease and any potential stenosis cannot be assessed on this non-gated CT examination. Assessment for potential risk factor modification, dietary therapy or pharmacologic therapy may be warranted, if clinically indicated. 4. There are  calcifications of the aortic valve. Echocardiographic correlation for evaluation of potential valvular dysfunction may be warranted if clinically indicated. 5. Diffuse bronchial wall thickening with moderate centrilobular and paraseptal emphysema; imaging findings suggestive of underlying COPD.      02/03/2017 Initial Biopsy    (R) neck lymph node biopsy: SMALL CELL CARCINOMA (most likely lung primary).       02/03/2017 Miscellaneous    Port-a-cath attempted by IR; unable to place d/t enlarged SVC.       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 Staging for metastatic disease  EXAM: MRI HEAD WITHOUT AND WITH CONTRAST  TECHNIQUE: Multiplanar, multiecho pulse sequences of the brain and surrounding structures were obtained without and with intravenous contrast.  CONTRAST:  15mL MULTIHANCE GADOBENATE DIMEGLUMINE 529 MG/ML IV SOLN  COMPARISON:  None.  FINDINGS: Brain: Negative for hydrocephalus. Cerebral volume normal for age. Small nonenhancing white matter hyperintensities consistent with mild chronic microvascular ischemia. No acute infarct. Negative for hemorrhage or mass or edema  Normal enhancement postcontrast infusion. No enhancing mass lesion. Leptomeningeal enhancement is normal.  Vascular: Normal arterial flow voids.  Normal venous enhancement  Skull and upper cervical spine: Negative  Sinuses/Orbits: Negative  Other: None  IMPRESSION: Negative for metastatic disease.  No acute abnormality.  Mild chronic white matter changes.      04/07/2017 Imaging     PET:  1. Marked reduction in size and metabolic activity of bulky RIGHT supraclavicular adenopathy mediastinal lymphadenopathy. 2. Residual moderate activity remains within  small RIGHT supraclavicular lymph node, RIGHT lower paratracheal lymph node and RIGHT hilar lymph node. 3. Resolution of prevascular and internal mammary mediastinal metastatic hypermetabolic activity. 4. Resolution of metabolic activity associated with solitary RIGHT hepatic lobe liver metastasis. 5. No evidence of disease progression. 6. 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      05/27/2017 Imaging    MRI brain w/ and w/o contrast IMPRESSION: 1. No metastatic disease identified. 2. Increased nonspecific cerebral white matter signal changes since August. These are most commonly small vessel disease related. 3. New right maxillary sinusitis. Benign appearing retention cysts in the nasopharynx with trace mastoid effusions.      06/12/2017 Imaging    PET-CT IMPRESSION: 1. There are two new hypermetabolic nodules identified within both lower lobes measuring up to 3.1 cm. The appearance is nonspecific and may be inflammatory/infectious in etiology. Pulmonary metastatic disease cannot be excluded and short-term follow-up imaging in 3 months is advised to reassess these nodules. 2. Stable appearance of mild hypermetabolic activity associated with right paratracheal and right hilar lymph nodes. 3. Decrease in FDG uptake associated with index right supraclavicular lymph node. 4. 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 5. Aortic Atherosclerosis (ICD10-I70.0) and Emphysema (ICD10-J43.9).         INTERVAL HISTORY:  Mr. Console 67 y.o. male returns to cancer center for follow-up for extensive stage small cell lung cancer.   Patient was last seen by Dr. Eliezer Lofts on 06/16/2017 with a reviewed his most recent PET scan. It was noted that he had 2 new hypermetabolic nodules within both lower  lobes measuring as much as 3.1 cm unfortunately the appearance was nonspecific and it could've been inflammatory/infectious in etiology. She ordered a short interval PET CT to be done in 2 months.  In the interim patient has had whole brain radiation PCI. He states he feels "fine" since radiation.  Patient presents today and tells me he as "feeling fine". He states his appetite is 50% and energy levels are 90%. He has some pain in his back and hips but this is chronic. He rates this pain 5 out of 10. He has occasional constipation and leg swelling. His main complaint today is numbness in his feet and hands and he states it is becoming hard to button his shirts. He is asking for a refill on his hydrocodone. He is also interested in this PET scan results today.   He denies any chest pain, shortness of breath, abdominal pain. He denies fever or chills.  REVIEW OF SYSTEMS:  Review of Systems  Constitutional: Positive for fatigue. Negative for chills and fever.  HENT:  Negative.   Eyes: Negative.   Respiratory: Negative.  Negative for cough and shortness of breath.   Cardiovascular: Negative.  Negative for leg swelling.  Gastrointestinal: Negative for abdominal pain, blood in stool, constipation, diarrhea, nausea and vomiting.  Endocrine: Negative.   Musculoskeletal: Negative for arthralgias, back pain and neck pain.  Neurological: Positive for numbness (Bilateral upper extremities). Negative for dizziness and headaches.  Hematological: Negative for adenopathy.  Psychiatric/Behavioral: Negative for sleep disturbance.     PAST MEDICAL/SURGICAL HISTORY:  Past Medical History:  Diagnosis Date  . Anxiety   . CAD (coronary artery disease)   . Cancer ([place])    stage 4 small cell lung cancer   . COPD (chronic obstructive pulmonary disease) (  [place])   . Depression   . Dyspnea    increased exertion  . Feeling of chest tightness   . Heart palpitations   . History of chemotherapy   . Myocardial  infarction ([place])   . Osteopenia   . Panic attacks   . Smoker    Past Surgical History:  Procedure Laterality Date  . BACK SURGERY  12/24/2000   L5,S1  . CORONARY STENT PLACEMENT  2005   RCA & CX  . HERNIA REPAIR Right 1980's  . INGUINAL HERNIA REPAIR  12/1978   right side  . IR FLUORO GUIDE PORT INSERTION RIGHT  04/02/2017  . IR US GUIDE BX ASP/DRAIN  02/03/2017  . IR US GUIDE VASC ACCESS RIGHT  04/02/2017  . NM MYOCAR PERF WALL MOTION  09/07/2009   No ischemia; EF 51%  . SHOULDER SURGERY Left 08/2010  . SPINE SURGERY  2002   L5-S1     SOCIAL HISTORY:  Social History   Socioeconomic History  . Marital status: Single    Spouse name: Not on file  . Number of children: Not on file  . Years of education: Not on file  . Highest education level: Not on file  Social Needs  . Financial resource strain: Not on file  . Food insecurity - worry: Not on file  . Food insecurity - inability: Not on file  . Transportation needs - medical: Not on file  . Transportation needs - non-medical: Not on file  Occupational History  . Not on file  Tobacco Use  . Smoking status: Current Every Day Smoker    Packs/day: 1.00    Types: Cigarettes  . Smokeless tobacco: Never Used  Substance and Sexual Activity  . Alcohol use: Yes    Comment: occas  . Drug use: No  . Sexual activity: Not on file  Other Topics Concern  . Not on file  Social History Narrative  . Not on file    FAMILY HISTORY:  Family History  Problem Relation Age of Onset  . Heart attack Father   . Kidney disease Father        renal failure  . Heart failure Mother   . Heart attack Mother   . Cancer Brother   . Diabetes Brother   . Alcohol abuse Brother   . Diabetes Sister     CURRENT MEDICATIONS:  Outpatient Encounter Medications as of 08/17/2017  Medication Sig  . ALPRAZolam (XANAX) 1 MG tablet Take 1 tablet (1 mg total) by mouth 4 (four) times daily.  Marland Kitchen atorvastatin (LIPITOR) 10 MG tablet TAKE 1 TABLET AT BEDTIME   (DISCONTINUE ATORVASTATIN 20MG )  . clotrimazole-betamethasone (LOTRISONE) cream Apply 1 application topically 2 (two) times daily.  . cyclobenzaprine (FLEXERIL) 5 MG tablet Take 1 tablet (5 mg total) 3 (three) times daily as needed by mouth for muscle spasms.  Marland Kitchen HYDROcodone-acetaminophen (NORCO) 5-325 MG tablet Take 1 tablet by mouth every 6 (six) hours as needed for moderate pain.  Marland Kitchen lidocaine-prilocaine (EMLA) cream Apply to affected area once  . polyethylene glycol powder (GLYCOLAX/MIRALAX) powder Take 17 g by mouth daily.  . traZODone (DESYREL) 100 MG tablet Take 400 mg by mouth at bedtime.  . [DISCONTINUED] HYDROcodone-acetaminophen (NORCO) 5-325 MG tablet Take 0.5-1 tablets every 6 (six) hours as needed by mouth for moderate pain.  . [DISCONTINUED] HYDROcodone-acetaminophen (NORCO) 5-325 MG tablet Take 1 tablet by mouth every 6 (six) hours as needed for moderate pain.  Marland Kitchen gabapentin (NEURONTIN) 300 MG capsule Take  1 capsule (300 mg total) by mouth at bedtime.  . ondansetron (ZOFRAN) 8 MG tablet Take 1 tablet (8 mg total) 2 (two) times daily as needed by mouth. Start on the third day after cisplatin chemotherapy. (Patient not taking: Reported on 08/17/2017)  . prochlorperazine (COMPAZINE) 10 MG tablet Take 1 tablet (10 mg total) every 6 (six) hours as needed by mouth (Nausea or vomiting). (Patient not taking: Reported on 08/17/2017)  . [DISCONTINUED] CISPLATIN IV Inject into the vein. Day 1 every 21 days  . [DISCONTINUED] dexamethasone (DECADRON) 4 MG tablet Take 1 tablet two times a day for 2 days after chemo, then stop until next cycle of chemo. Take medication with food.  . [DISCONTINUED] ETOPOSIDE IV Inject into the vein. Days 1,2,3 every 21 days  . [DISCONTINUED] Pegfilgrastim (NEULASTA ONPRO [place]) Inject into the skin. Every 21 days   No facility-administered encounter medications on file as of 08/17/2017.     ALLERGIES:  Allergies  Allergen Reactions  . Codeine Nausea Only  . Niaspan  [Niacin Er]      PHYSICAL EXAM:  ECOG Performance status: 1 - Symptomatic; remains independent   RN vitals reviewed.  Vitals:   08/17/17 1224  BP: 131/64  Pulse: 72  Resp: 18  Temp: 97.7 F (36.5 C)  SpO2: 95%     Physical Exam  Constitutional: He is oriented to person, place, and time and well-developed, well-nourished, and in no distress.     HENT:  Head: Normocephalic.  Mouth/Throat: Oropharynx is clear and moist. No oropharyngeal exudate.  Eyes: Conjunctivae are normal. Pupils are equal, round, and reactive to light. No scleral icterus.  Neck: Normal range of motion.  Cardiovascular: Normal rate and regular rhythm.  Pulmonary/Chest: Effort normal and breath sounds normal. No respiratory distress. He has no wheezes.  Abdominal: Soft. Bowel sounds are normal. There is no tenderness. There is no rebound.  Musculoskeletal: Normal range of motion. He exhibits no edema.  Neurological: He is alert and oriented to person, place, and time. A sensory deficit is present. No cranial nerve deficit. Gait normal.  Numbness and tingling in bilateral upper and lower extremities. Patient unable to button his shirt.  Skin: Skin is warm. No pallor.  Psychiatric: Mood, memory, affect and judgment normal.  Nursing note and vitals reviewed.    LABORATORY DATA:  I have reviewed the labs as listed.  CBC    Component Value Date/Time   WBC 10.1 08/12/2017 0956   RBC 4.57 08/12/2017 0956   HGB 14.3 08/12/2017 0956   HCT 44.0 08/12/2017 0956   PLT 157 08/12/2017 0956   MCV 96.3 08/12/2017 0956   MCH 31.3 08/12/2017 0956   MCHC 32.5 08/12/2017 0956   RDW 16.1 (H) 08/12/2017 0956   LYMPHSABS 1.5 08/12/2017 0956   MONOABS 0.8 08/12/2017 0956   EOSABS 0.2 08/12/2017 0956   BASOSABS 0.0 08/12/2017 0956   CMP Latest Ref Rng & Units 08/12/2017 05/25/2017 05/04/2017  Glucose 65 - 99 mg/dL 108(H) 169(H) 97  BUN 6 - 20 mg/dL 36(H) 29(H) 30(H)  Creatinine 0.61 - 1.24 mg/dL 1.00 0.91 0.83    Sodium 135 - 145 mmol/L 132(L) 133(L) 134(L)  Potassium 3.5 - 5.1 mmol/L 4.2 4.0 4.1  Chloride 101 - 111 mmol/L 99(L) 101 103  CO2 22 - 32 mmol/L 24 26 26   Calcium 8.9 - 10.3 mg/dL 9.1 8.6(L) 8.8(L)  Total Protein 6.5 - 8.1 g/dL 6.9 5.7(L) 5.6(L)  Total Bilirubin 0.3 - 1.2 mg/dL 0.6 0.3 0.3  Alkaline Phos 38 - 126 U/L 55 83 82  AST 15 - 41 U/L 22 21 17   ALT 17 - 63 U/L 20 36 28    PENDING LABS:    DIAGNOSTIC IMAGING:   PET scan 08/13/17   IMPRESSION: 1. Irregular solid hypermetabolic pulmonary nodules in the bilateral lower lobes have increased in density and metabolism. New hypermetabolic solid left upper lobe pulmonary nodule. Unfortunately, these findings are most compatible with progressive pulmonary metastases. 2. Stable hypermetabolic right hilar and right paratracheal mediastinal adenopathy. 3. Stable hypermetabolic small lower right superficial parotid nodule, most compatible with primary parotid neoplasm. 4. Nonspecific focal hypermetabolism at the anorectal junction, increased. Although potentially physiologic, correlation with clinical evaluation is recommended to exclude neoplasm in this location. 5. Chronic findings include: Aortic Atherosclerosis (ICD10-I70.0) and Emphysema (ICD10-J43.9). Three-vessel coronary atherosclerosis. Chronic right maxillary sinusitis. Mild sigmoid diverticulosis.   *The following radiologic images and reports have been reviewed independently and agree with below findings.  CT neck: 01/16/17      MRI brain: 02/11/17 CLINICAL DATA:  Advanced stage small cell lung cancer. Staging for metastatic disease  EXAM: MRI HEAD WITHOUT AND WITH CONTRAST  TECHNIQUE: Multiplanar, multiecho pulse sequences of the brain and surrounding structures were obtained without and with intravenous contrast.  CONTRAST:  57mL MULTIHANCE GADOBENATE DIMEGLUMINE 529 MG/ML IV SOLN  COMPARISON:  None.  FINDINGS: Brain: Negative for hydrocephalus.  Cerebral volume normal for age. Small nonenhancing white matter hyperintensities consistent with mild chronic microvascular ischemia. No acute infarct. Negative for hemorrhage or mass or edema  Normal enhancement postcontrast infusion. No enhancing mass lesion. Leptomeningeal enhancement is normal.  Vascular: Normal arterial flow voids.  Normal venous enhancement  Skull and upper cervical spine: Negative  Sinuses/Orbits: Negative  Other: None  IMPRESSION: Negative for metastatic disease.  No acute abnormality.  Mild chronic white matter changes.   Electronically Signed   By: Franchot Gallo M.D.   On: 02/11/2017 15:35  PATHOLOGY:  (R) neck lymph node biopsy: 02/03/17    ASSESSMENT & PLAN:   Extensive-stage small cell lung cancer with liver mets:  -Initially presented to his PCP in early 01/2017 with 3-week history of right neck mass.  CT chest performed on 01/16/17 revealed extensive lymphadenopathy in chest and lower right cervical lymph node area; also indeterminate lesion in liver.  CT neck also performed on 01/16/17 also showed bulky (R) supraclavicular malignant lymph node measuring 5.4 cm, with several abnormal level 3 & 4 cervical lymph nodes and lymphadenopathy to superior mediastinum. IR attempted to place port-a-cath for anticipated chemotherapy; they were unable to place port d/t enlarged SVC and aborted procedure d/t concerns for patient safety.  Biopsy of (R) neck mass revealed small cell carcinoma, most consistent with a lung primary.   PET scan does reveal hypermetabolic liver lesion, as well as multiple sites of hypermetabolic lymphadenopathy. He understands that his cancer is treatable, but not curable.  Completed 6 cycles of cisplatin+ etoposide on 05/27/17.  Most recent PET scan shows irregular solid hypermetabolic pulmonary nodules in the bilateral lower lobes. Additionally there are new hypermetabolic solid left upper lobe pulmonary nodule. This was  unfortunately findings most compatible with progressive pulmonary metastases. Spoke with Dr. Grayland Ormond regarding plan of care for this patient given new findings. He recommended salvage chemotherapy beginning immediately with Topotecan. Will arrange for patient to return next week for chemotherapy teaching and initiation of chemotherapy. He is scheduled to see covering provider at that time. He is in agreement with this plan.  Tobacco use disorder:  -Continue efforts on smoke cessation.  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 Patient received cisplatin and etoposide completed 05/27/2017.  Rx gabapentin 300 mg at bedtime. Spoke to the patient about side effects of gabapentin. Provided handout. Also explained that this can make him feel drowsy and to not take in close proximity with pain medication. I asked him to start to take this medication at night. Will reevaluate next week to see if this medication is helping.  Refilled pain medication as well.  Dispo:  return to clinic next week for initiation of Topotecan and chemotherapy teaching.   All questions were answered to patient's stated satisfaction. Encouraged patient to call with any new concerns or questions before his next visit to the cancer center and we can certain see him sooner, if needed.    Twana First, MD

## 2017-08-17 NOTE — Patient Instructions (Signed)
[place] at [facility] Discharge Instructions  RECOMMENDATIONS MADE BY THE CONSULTANT AND ANY TEST RESULTS WILL BE SENT TO YOUR REFERRING PHYSICIAN.  You were seen today by Rulon Abide, NP Follow up next week for labs/MD visit and new chemotherapy treatment.  Thank you for choosing [place] at [facility] to provide your oncology and hematology care.  To afford each patient quality time with our provider, please arrive at least 15 minutes before your scheduled appointment time.    If you have a lab appointment with the [facility] please come in thru the  Main Entrance and check in at the main information desk  You need to re-schedule your appointment should you arrive 10 or more minutes late.  We strive to give you quality time with our providers, and arriving late affects you and other patients whose appointments are after yours.  Also, if you no show three or more times for appointments you may be dismissed from the clinic at the providers discretion.     Again, thank you for choosing [facility].  Our hope is that these requests will decrease the amount of time that you wait before being seen by our physicians.       _____________________________________________________________  Should you have questions after your visit to [facility], please contact our office at [phone] between the hours of 8:30 a.m. and 4:30 p.m.  Voicemails left after 4:30 p.m. will not be returned until the following business day.  For prescription refill requests, have your pharmacy contact our office.       Resources For Cancer Patients and their Caregivers ? American Cancer Society: Can assist with transportation, wigs, general needs, runs Look Good Feel Better.        (270)716-2141 ? Cancer Care: Provides financial assistance, online support groups, medication/co-pay assistance.  1-800-813-HOPE 760-079-4343) ? [place] Assists [place] cancer patients and their families through emotional , educational and financial support.  7086733830 ? [place] DSS Where to apply for food stamps, Medicaid and utility assistance. 570-302-2379 ? [facility]: Transportation to medical appointments. (816) 574-3005 ? Social Security Administration: May apply for disability if have a Stage IV cancer. (206)407-5313 515-781-5113 ? [employer], Disability and Transit Services: Assists with nutrition, care and transit needs. [place] Support Programs: @10RELATIVEDAYS @ > Cancer Support Group  2nd Tuesday of the month 1pm-2pm, Journey Room  > Creative Journey  3rd Tuesday of the month 1130am-1pm, Journey Room  > Look Good Feel Better  1st Wednesday of the month 10am-12 noon, Journey Room (Call [place] to register [phone])

## 2017-08-18 MED ORDER — HYDROCODONE-ACETAMINOPHEN 5-325 MG PO TABS: 1.0000 | ORAL_TABLET | Freq: Four times a day (QID) | ORAL | 0 refills | Status: DC | PRN

## 2017-08-18 MED ORDER — GABAPENTIN 300 MG PO CAPS: 300.0000 mg | ORAL_CAPSULE | Freq: Every day | ORAL | 1 refills | Status: DC

## 2017-08-18 NOTE — Patient Instructions (Addendum)
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  This treatment is palliative or non-curative.  This means that you are not curable but treatable. This treatment will be Monday-Friday every 3 weeks.  You will continue taking this treatment every 3 weeks until you progress or you have toxicity from the drug.   You will see the doctor regularly throughout treatment.  We monitor your lab work prior to every treatment.  The doctor monitors your response to treatment by the way you are feeling, your blood work, and scans periodically.  There will be wait times while you are here for treatment.  It will take about 30 minutes to 1 hour for your lab work to result.  Then pharmacy has to mix your medications.     You will receive pre-meds prior to your chemotherapy.  On day 1 you will receive aloxi (a high powered nausea medication that will be given through your IV) and compazine (nausea pill).  On day 2-5 you will receive compazine prior to your chemotherapy.  On day 5 after your chemotherapy has been completed you will get neulasta.   Neulasta - this medication is not chemo but being given because you have had chemo. It is usually given 24-27 hours after the completion of chemotherapy. This medication works by boosting your bone marrow's supply of white blood cells. White blood cells are what protect our bodies against infection. The medication is given in the form of a subcutaneous injection. It is given in the fatty tissue of your abdomen. It is a short needle. The major side effect of this medication is bone or muscle pain. The drug of choice to relieve or lessen the pain is Aleve or Ibuprofen. If a physician has ever told you not to take Aleve or Ibuprofen - then don't take it. You should then take Tylenol/acetaminophen. Take  either medication as the bottle directs you to.  The level of pain you experience as a result of this injection can range from none, to mild or moderate, or severe. Please let us know if you develop moderate or severe bone pain.   **DO NOT expose the Neulasta On-body injector to diagnostic imaging (CT scans, MRI, Ultrasound, X-ray), radiation treatment, or oxygen rich environments, such as hyperbaric chambers. **   Topotecan Hydrochloride (Generic Name) Other Names: HycamtinTM   About This Drug Topotecan hydrochloride is used to treat cancer. It is given in the vein (IV). (this drug takes 30 minutes to infuse)   POTENTIAL SIDE EFFECTS OF TREATMENT:   Possible Side Effects (More Common) . Bone marrow depression. This is a decrease in the number of white blood cells, red blood cells, and platelets. This may raise your risk of infection, make you tired and weak (fatigue), and raise your risk of bleeding. . Constipation (not able to move bowels) . Loose bowel movements (diarrhea) that may last for a few days . Hair loss. You may notice your hair getting thin. Some patients lose their hair. Your hair often grows back when treatment is done. . Nausea and throwing up (vomiting). These symptoms may happen within a few hours or many hours after your treatment. Medicines are available to stop or lessen these side effects. . Feeling tired   Possible Side Effects (Less Common) . Changes in your liver function.  Your doctor will check your liver function as needed . Rash   Treating Side Effects . Ask your doctor or nurse about medicine that is available to help stop or lessen constipation. . If you cannot move your bowels, check with your doctor or nurse before you use any enemas, laxatives, or suppositories . Drink 6-8 cups of fluids each day unless your doctor has told you to limit your fluid intake due to some other health problem. A cup is 8 ounces of fluid. If you throw up or have loose bowel  movements, you should drink more fluids so that you do not become dehydrated (lack water in the body from losing too much fluid).Ask your doctor or nurse about medication that is available to help prevent or lessen diarrhea. . Speak with your nurse about obtaining a wig before you lose your hair. Also, call the [facility] at [phone]-2345 to find out information about the "Look Good, Feel Better" program close to where you live. It is a free program where women undergoing chemotherapy learn about wigs, turbans and scarves as well as makeup techniques and skin and nail care. . If you get a rash, do not put anything on it unless your doctor or nurse says you may. Keep the area around the rash clean and dry. Ask your doctor for medicine if your rash bothers you.   Allergic Reactions Allergic reactions including anaphylaxis are rare but may happen in some patients. Signs of allergic reaction to this drug may be swelling of the face, feeling like your tongue or throat are swelling, trouble breathing, rash, itching, fever, chills, feeling dizzy, and/or feeling that your heart is beating in a fast or not normal way. If this happens, do not take another dose of this drug. You should get urgent medical treatment.   Food and Drug Interactions There are no known interactions of topotecan with food. This drug may interact with other medicines. Tell your doctor and pharmacist about all the medicines and dietary supplements (vitamins, minerals, herbs and others) that you are taking at this time. The safety and use of dietary supplements and alternative diets are often not known. Using these might affect your cancer or interfere with your treatment. Until more is known, you should not use dietary supplements or alternative diets without your cancer doctor's help.   When to Call the Doctor Call your doctor or nurse right away if you have any of these symptoms: . No bowel movement in 3 days or if you feel  uncomfortable. . Loose bowel movements (diarrhea) 4 times in one day or diarrhea with weakness or feeling lightheaded . Fever of 100.5 F (38 C) or above . Easy bruising or bleeding . Wheezing or difficulty breathing . Rash or itching . Feeling dizzy or lightheaded . Nausea that stops you from eating or drinking . Throwing up more than 3 times a day . Signs of liver problems: dark urine, pale stools, bad stomach pain, feeling very tired and weak, unusual itching, or yellowing of the eyes or skin Call your doctor or nurse as soon as possible if any of the following symptoms occur: . Pain in your mouth or throat that makes it hard to eat or drink . Nausea that is not relieved by prescribed medicines . Rash that is not relieved by prescribed medicines . Headache that does not go away   Reproduction Concerns . Pregnancy warning: This drug may have harmful effects on the unborn child, so effective methods of  birth control should be used during your cancer treatment. . Genetic counseling is available for you to talk about the effects of this drug therapy on future pregnancies. Also, a genetic counselor can look at the possible risk of problems in the unborn baby due to this medicine if an exposure happens during pregnancy. . Breast feeding warning: It is not known if this drug passes into breast milk. For this reason, women should talk to their doctor about the risks and benefits of breast feeding during treatment with this drug because this drug may enter the breast milk and badly harm a breast feeding baby.       SELF CARE ACTIVITIES WHILE ON CHEMOTHERAPY: Hydration Increase your fluid intake 48 hours prior to treatment and drink at least 8 to 12 cups (64 ounces) of water/decaff beverages per day after treatment. You can still have your cup of coffee or soda but these beverages do not count as part of your 8 to 12 cups that you need to drink daily. No alcohol intake.   Medications Continue  taking your normal prescription medication as prescribed.  If you start any new herbal or new supplements please let us know first to make sure it is safe.   Mouth Care Have teeth cleaned professionally before starting treatment. Keep dentures and partial plates clean. Use soft toothbrush and do not use mouthwashes that contain alcohol. Biotene is a good mouthwash that is available at most pharmacies or may be ordered by calling [phone]. Use warm salt water gargles (1 teaspoon salt per 1 quart warm water) before and after meals and at bedtime. Or you may rinse with 2 tablespoons of three-percent hydrogen peroxide mixed in eight ounces of water. If you are still having problems with your mouth or sores in your mouth please call the clinic.  If you need dental work, please let the doctor know before you go for your appointment so that we can coordinate the best possible time for you in regards to your chemo regimen. You need to also let your dentist know that you are actively taking chemo. We may need to do labs prior to your dental appointment.   Skin Care Always use sunscreen that has not expired and with SPF (Sun Protection Factor) of 50 or higher. Wear hats to protect your head from the sun. Remember to use sunscreen on your hands, ears, face, & feet.  Use good moisturizing lotions such as udder cream, eucerin, or even Vaseline. Some chemotherapies can cause dry skin, color changes in your skin and nails.      Avoid long, hot showers or baths.  Use gentle, fragrance-free soaps and laundry detergent.  Use moisturizers, preferably creams or ointments rather than lotions because the thicker consistency is better at preventing skin dehydration. Apply the cream or ointment within 15 minutes of showering. Reapply moisturizer at night, and moisturize your hands every time after you wash them.   Hair Loss (if your doctor says your hair will fall out)    If your doctor says that your hair is  likely to fall out, decide before you begin chemo whether you want to wear a wig. You may want to shop before treatment to match your hair color.  Hats, turbans, and scarves can also camouflage hair loss, although some people prefer to leave their heads uncovered. If you go bare-headed outdoors, be sure to use sunscreen on your scalp.  Cut your hair short. It eases the inconvenience of shedding lots of  hair, but it also can reduce the emotional impact of watching your hair fall out.  Don't perm or color your hair during chemotherapy. Those chemical treatments are already damaging to hair and can enhance hair loss. Once your chemo treatments are done and your hair has grown back, it's OK to resume dyeing or perming hair. With chemotherapy, hair loss is almost always temporary. But when it grows back, it may be a different color or texture. In older adults who still had hair color before chemotherapy, the new growth may be completely gray.  Often, new hair is very fine and soft.   Infection Prevention Please wash your hands for at least 30 seconds using warm soapy water. Handwashing is the #1 way to prevent the spread of germs. Stay away from sick people or people who are getting over a cold. If you develop respiratory systems such as green/yellow mucus production or productive cough or persistent cough let us know and we will see if you need an antibiotic. It is a good idea to keep a pair of gloves on when going into grocery stores/[employer] to decrease your risk of coming into contact with germs on the carts, etc. Carry alcohol hand gel with you at all times and use it frequently if out in public. If your temperature reaches 100.5 or higher please call the clinic and let us know.  If it is after hours or on the weekend please go to the ER if your temperature is over 100.5.  Please have your own personal thermometer at home to use.     Sex and bodily fluids If you are going to have sex, a condom must be  used to protect the person that isn't taking chemotherapy. Chemo can decrease your libido (sex drive). For a few days after chemotherapy, chemotherapy can be excreted through your bodily fluids.  When using the toilet please close the lid and flush the toilet twice.  Do this for a few day after you have had chemotherapy.    Effects of chemotherapy on your sex life Some changes are simple and won't last long. They won't affect your sex life permanently. Sometimes you may feel:  too tired  not strong enough to be very active  sick or sore   not in the mood  anxious or low Your anxiety might not seem related to sex. For example, you may be worried about the cancer and how your treatment is going. Or you may be worried about money, or about how you family are coping with your illness. These things can cause stress, which can affect your interest in sex. It's important to talk to your partner about how you feel. Remember - the changes to your sex life don't usually last long. There's usually no medical reason to stop having sex during chemo. The drugs won't have any long term physical effects on your performance or enjoyment of sex. Cancer can't be passed on to your partner during sex   Contraception It's important to use reliable contraception during treatment. Avoid getting pregnant while you or your partner are having chemotherapy. This is because the drugs may harm the baby. Sometimes chemotherapy drugs can leave a man or woman infertile.  This means you would not be able to have children in the future. You might want to talk to someone about permanent infertility. It can be very difficult to learn that you may no longer be able to have children. Some people find counselling helpful. There might be  ways to preserve your fertility, although this is easier for men than for women. You may want to speak to a fertility expert. You can talk about sperm banking or harvesting your eggs. You can also ask  about other fertility options, such as donor eggs. If you have or have had breast cancer, your doctor might advise you not to take the contraceptive pill. This is because the hormones in it might affect the cancer.  It is not known for sure whether or not chemotherapy drugs can be passed on through semen or secretions from the vagina. Because of this some doctors advise people to use a barrier method (such as condoms, femidoms or dental dams) if you have sex during treatment. This applies to vaginal, anal or oral sex. Generally, doctors advise a barrier method only for the time you are actually having the treatment and for about a week after your treatment. Advice like this can be worrying, but this does not mean that you have to avoid being intimate with your partner. You can still have close contact with your partner and continue to enjoy sex.    Animals If you have cats or birds we just ask that you not change the litter or change the cage.  Please have someone else do this for you while you are on chemotherapy.    Food Safety During and After Cancer Treatment Food safety is important for people both during and after cancer treatment. Cancer and cancer treatments, such as chemotherapy, radiation therapy, and stem cell/bone marrow transplantation, often weaken the immune system. This makes it harder for your body to protect itself from foodborne illness, also called food poisoning. Foodborne illness is caused by eating food that contains harmful bacteria, parasites, or viruses.   Foods to avoid Some foods have a higher risk of becoming tainted with bacteria. These include:  Unwashed fresh fruit and vegetables, especially leafy vegetables that can hide dirt and other contaminants  Raw sprouts, such as alfalfa sprouts  Raw or undercooked beef, especially ground beef, or other raw or undercooked meat and poultry  Cold hot dogs or deli lunch meat (cold cuts), including dry-cured, uncooked salami.  Always cook or reheat these foods until they are steaming hot.  Fatty, fried, or spicy foods immediately before or after treatment.  These can sit heavy on your stomach and make you feel nauseous.  Raw or undercooked shellfish, such as oysters.  Sushi and sashimi, which often contain raw fish.   Unpasteurized beverages, such as unpasteurized fruit juices, raw milk, raw yogurt, or cider  Soft cheeses made from unpasteurized milk, such as blue-veined (a type of blue cheese), Brie, Camembert, feta, goat cheese, and queso fresco or blanco  Undercooked eggs, such as soft boiled, over easy, and poached; raw, unpasteurized eggs; or foods made with raw egg, such as homemade raw cookie dough and homemade mayonnaise  Deli-prepared salads with egg, ham, chicken, or seafood  Simple steps for food safety Shop smart.  Do not buy food stored or displayed in an unclean area.  Do not buy bruised or damaged fruits or vegetables.  Do not buy cans that have cracks, dents, or bulges.  Pick up foods that can spoil at the end of your shopping trip and store them in a cooler on the way home. Prepare and clean up foods carefully.  Rinse all fresh fruits and vegetables under running water, and dry them with a clean towel or paper towel.  Clean the top of cans before  opening them.  After preparing food, wash your hands for 20 seconds with hot water and soap. Pay special attention to areas between fingers and under nails.  Clean your utensils and dishes with hot water and soap.  Disinfect your kitchen and cutting boards using 1 teaspoon of liquid, unscented bleach mixed into 1 quart of water.   Prevent cross-contamination.  Keep raw meat, poultry, and fish or their juices away from other food. Bacteria can spread through contact with the food or its liquid, causing cross-contamination.  Do not rinse raw meat or poultry because it can spread bacteria to nearby surfaces.  Wash all items you used for  preparing raw foods, including utensils, cutting board, and plates, before using them for other foods or cooked meat.  Set aside a specific cutting board for preparing uncooked meat, fish, and chicken. Never use it for uncooked fruits, vegetables, or other foods. Dispose of old food.  Eat canned and packaged food before its expiration date (the "use by" or "best before" date).  Consume refrigerated leftovers within 3 to 4 days. After that time, throw out the food. Even if the food does not smell or look spoiled, it still may be unsafe. Some bacteria, such as Listeria, can grow even on foods stored in the refrigerator if they are kept for too long. Take precautions when eating out.  At restaurants, avoid buffets and salad bars where food sits out for a long time and comes in contact with many people. Food can become contaminated when someone with a virus, often a norovirus, or another "bug" handles it.  Put any leftover food in a "to-go" container yourself, rather than having the server do it. And, refrigerate leftovers as soon as you get home.  Choose restaurants that are clean and that are willing to prepare your food as you order it cooked. Cook food to the right temperature. Use a food thermometer to check for a safe internal temperature of all poultry and meat. For instance, a hamburger should be cooked to at least medium (160?F or 71?C). Get a full list of recommended internal cooking temperatures on the website of the [place]. Food and Drug Administration (FDA).   Chill food promptly. Refrigerate or freeze perishable food within 2 hours of cooking or buying it (sooner in warm weather.) Proper cooking destroys bacteria, but they can still grow on cooked food that is left out too long. Food stored in the refrigerator should be kept at below 40?F (4?C). And, food stored in the freezer should be kept below 32?F (0?C). Thaw food properly. Thaw frozen food in the refrigerator rather than at room  temperature. You can also thaw food in frequently changed cold water or in the microwave, but cook it as soon as it thaws.         MEDICATIONS: Zofran/Ondansetron 8mg  tablet. Take 1 tablet every 8 hours as needed for nausea/vomiting. (#1 nausea med to take, this can constipate)   Compazine/Prochlorperazine 10mg  tablet. Take 1 tablet every 6 hours as needed for nausea/vomiting. (#2 nausea med to take, this can make you sleepy)     EMLA cream. Apply a quarter size amount to port site 1 hour prior to chemo. Do not rub in. Cover with plastic wrap.     Over-the-Counter Meds:   Miralax 1 capful in 8 oz of fluid daily. May increase to two times a day if needed. This is a stool softener. If this doesn't work proceed you can add:   Senokot S-start  with 1 tablet two times a day and increase to 4 tablets two times a day if needed. (total of 8 tablets in a 24 hour period). This is a stimulant laxative.    Call us if this does not help your bowels move.    Imodium 2mg  capsule. Take 2 capsules after the 1st loose stool and then 1 capsule every 2 hours until you go a total of 12 hours without having a loose stool. Call the [facility] if loose stools continue. If diarrhea occurs @ bedtime, take 2 capsules @ bedtime. Then take 2 capsules every 4 hours until morning. Call [facility].        Diarrhea Sheet  If you are having loose stools/diarrhea, please purchase Imodium and begin taking as outlined:  At the first sign of poorly formed or loose stools you should begin taking Imodium(loperamide) 2 mg capsules.  Take two caplets (4mg ) followed by one caplet (2mg ) every 2 hours until you have had no diarrhea for 12 hours.  During the night take two caplets (4mg ) at bedtime and continue every 4 hours during the night until the morning.  Stop taking Imodium only after there is no sign of diarrhea for 12 hours.     Always call the [facility] if you are having loose stools/diarrhea that you can't get  under control.  Loose stools/disrrhea leads to dehydration (loss of water) in your body.  We have other options of trying to get the loose stools/diarrhea to stopped but you must let us know!         Constipation Sheet *Miralax in 8 oz of fluid daily.  May increase to two times a day if needed.  This is a stool softener.  If this not enough to keep your bowel regular:   You can add:   *Senokot S, start with one tablet twice a day and can increase to 4 tablets twice a day if needed.  This is a stimulant laxative.     Sometimes when you take pain medication you need BOTH a medicine to keep your stool soft and a medicine to help your bowel push it out!   Please call if the above does not work for you.    Do not go more than 2 days without a bowel movement.  It is very important that you do not become constipated.  It will make you feel sick to your stomach (nausea) and can cause abdominal pain and vomiting.       Nausea Sheet  Zofran/Ondansetron 8mg  tablet. Take 1 tablet every 8 hours as needed for nausea/vomiting. (#1 nausea med to take, this can constipate)   Compazine/Prochlorperazine 10mg  tablet. Take 1 tablet every 6 hours as needed for nausea/vomiting. (#2 nausea med to take, this can make you sleepy)   You can take these medications together or separately.  We would first like for you to try the Ondansetron by itself and then take the Prochloperizine if needed. But you are allowed to take both medications at the same time if your nausea is that severe.  If you are having persistent nausea (nausea that does not stop) please take these medications on a staggered schedule so that the nausea medication stays in your body.  Please call the [facility] and let us know the amount of nausea that you are experiencing.  If you begin to vomit, you need to call the [facility] and if it is the weekend and you have vomited more than  one time and cant get it to stop-go to the Emergency Room.   Persistent nausea/vomiting can lead to dehydration (loss of fluid in your body) and will make you feel terrible.    Ice chips, sips of clear liquids, foods that are @ room temperature, crackers, and toast tend to be better tolerated.         SYMPTOMS TO REPORT AS SOON AS POSSIBLE AFTER TREATMENT:  FEVER GREATER THAN 100.5 F  CHILLS WITH OR WITHOUT FEVER  NAUSEA AND VOMITING THAT IS NOT CONTROLLED WITH YOUR NAUSEA MEDICATION  UNUSUAL SHORTNESS OF BREATH  UNUSUAL BRUISING OR BLEEDING  TENDERNESS IN MOUTH AND THROAT WITH OR WITHOUT PRESENCE OF ULCERS  URINARY PROBLEMS  BOWEL PROBLEMS  UNUSUAL RASH     Wear comfortable clothing and clothing appropriate for easy access to any Portacath or PICC line. Let us know if there is anything that we can do to make your therapy better!        What to do if you need assistance after hours or on the weekends: CALL [phone].  HOLD on the line, do not hang up.  You will hear multiple messages but at the end you will be connected with a nurse triage line.  They will contact the doctor if necessary.  Most of the time they will be able to assist you.  Do not call the hospital operator.          I have been informed and understand all of the instructions given to me and have received a copy. I have been instructed to call the clinic [phone] or my family physician as soon as possible for continued medical care, if indicated. I do not have any more questions at this time but understand that I may call the [facility] at [phone] during office hours should I have questions or need assistance in obtaining follow-up care.

## 2017-08-19 ENCOUNTER — Encounter (HOSPITAL_COMMUNITY): Payer: Self-pay | Admitting: Emergency Medicine

## 2017-08-19 NOTE — Progress Notes (Signed)
Topotecan teaching pulled together.  Appts made.

## 2017-08-20 ENCOUNTER — Inpatient Hospital Stay (HOSPITAL_COMMUNITY): Payer: Medicare PPO

## 2017-08-20 ENCOUNTER — Other Ambulatory Visit (HOSPITAL_COMMUNITY): Payer: Self-pay | Admitting: Pharmacist

## 2017-08-20 DIAGNOSIS — C3490 Malignant neoplasm of unspecified part of unspecified bronchus or lung: Secondary | ICD-10-CM

## 2017-08-20 MED ORDER — PROCHLORPERAZINE MALEATE 10 MG PO TABS: 10.0000 mg | ORAL_TABLET | Freq: Four times a day (QID) | ORAL | 1 refills | Status: DC | PRN

## 2017-08-20 NOTE — Progress Notes (Signed)
Chemotherapy teaching completed on Topotecan.  Extensive teaching packet given.  Consent signed.

## 2017-08-21 ENCOUNTER — Other Ambulatory Visit: Payer: Self-pay | Admitting: Oncology

## 2017-08-24 ENCOUNTER — Encounter (HOSPITAL_COMMUNITY): Payer: Self-pay | Admitting: Internal Medicine

## 2017-08-24 ENCOUNTER — Inpatient Hospital Stay (HOSPITAL_BASED_OUTPATIENT_CLINIC_OR_DEPARTMENT_OTHER): Payer: Medicare PPO | Admitting: Internal Medicine

## 2017-08-24 ENCOUNTER — Other Ambulatory Visit: Payer: Self-pay

## 2017-08-24 ENCOUNTER — Inpatient Hospital Stay (HOSPITAL_COMMUNITY): Payer: Medicare PPO

## 2017-08-24 VITALS — BP 144/63 | HR 75 | Temp 97.6°F | Resp 18

## 2017-08-24 VITALS — BP 124/54 | HR 87 | Temp 97.5°F | Resp 18 | Ht 69.0 in | Wt 169.4 lb

## 2017-08-24 DIAGNOSIS — C3490 Malignant neoplasm of unspecified part of unspecified bronchus or lung: Secondary | ICD-10-CM

## 2017-08-24 DIAGNOSIS — R918 Other nonspecific abnormal finding of lung field: Secondary | ICD-10-CM | POA: Diagnosis not present

## 2017-08-24 DIAGNOSIS — F17210 Nicotine dependence, cigarettes, uncomplicated: Secondary | ICD-10-CM | POA: Diagnosis not present

## 2017-08-24 DIAGNOSIS — M25552 Pain in left hip: Secondary | ICD-10-CM | POA: Diagnosis not present

## 2017-08-24 DIAGNOSIS — R630 Anorexia: Secondary | ICD-10-CM

## 2017-08-24 DIAGNOSIS — M25551 Pain in right hip: Secondary | ICD-10-CM | POA: Diagnosis not present

## 2017-08-24 DIAGNOSIS — C770 Secondary and unspecified malignant neoplasm of lymph nodes of head, face and neck: Secondary | ICD-10-CM

## 2017-08-24 DIAGNOSIS — G629 Polyneuropathy, unspecified: Secondary | ICD-10-CM | POA: Diagnosis not present

## 2017-08-24 DIAGNOSIS — M25512 Pain in left shoulder: Secondary | ICD-10-CM | POA: Diagnosis not present

## 2017-08-24 DIAGNOSIS — C787 Secondary malignant neoplasm of liver and intrahepatic bile duct: Secondary | ICD-10-CM | POA: Diagnosis not present

## 2017-08-24 DIAGNOSIS — Z5111 Encounter for antineoplastic chemotherapy: Secondary | ICD-10-CM | POA: Diagnosis not present

## 2017-08-24 LAB — CBC WITH DIFFERENTIAL/PLATELET
Basophils Absolute: 0 10*3/uL (ref 0.0–0.1)
Basophils Relative: 0 %
Eosinophils Absolute: 0.1 10*3/uL (ref 0.0–0.7)
Eosinophils Relative: 2 %
HCT: 38.9 % — ABNORMAL LOW (ref 39.0–52.0)
Hemoglobin: 12.4 g/dL — ABNORMAL LOW (ref 13.0–17.0)
Lymphocytes Relative: 24 %
Lymphs Abs: 1.3 10*3/uL (ref 0.7–4.0)
MCH: 30.3 pg (ref 26.0–34.0)
MCHC: 31.9 g/dL (ref 30.0–36.0)
MCV: 95.1 fL (ref 78.0–100.0)
Monocytes Absolute: 0.4 10*3/uL (ref 0.1–1.0)
Monocytes Relative: 7 %
Neutro Abs: 3.6 10*3/uL (ref 1.7–7.7)
Neutrophils Relative %: 67 %
Platelets: 176 10*3/uL (ref 150–400)
RBC: 4.09 MIL/uL — ABNORMAL LOW (ref 4.22–5.81)
RDW: 15.3 % (ref 11.5–15.5)
WBC: 5.4 10*3/uL (ref 4.0–10.5)

## 2017-08-24 LAB — COMPREHENSIVE METABOLIC PANEL
ALT: 17 U/L (ref 17–63)
AST: 18 U/L (ref 15–41)
Albumin: 3.5 g/dL (ref 3.5–5.0)
Alkaline Phosphatase: 62 U/L (ref 38–126)
Anion gap: 9 (ref 5–15)
BUN: 19 mg/dL (ref 6–20)
CO2: 22 mmol/L (ref 22–32)
Calcium: 8.8 mg/dL — ABNORMAL LOW (ref 8.9–10.3)
Chloride: 105 mmol/L (ref 101–111)
Creatinine, Ser: 0.92 mg/dL (ref 0.61–1.24)
GFR calc Af Amer: >60 mL/min (ref >60)
GFR calc non Af Amer: >60 mL/min (ref >60)
Glucose, Bld: 158 mg/dL — ABNORMAL HIGH (ref 65–99)
Potassium: 4.2 mmol/L (ref 3.5–5.1)
Sodium: 136 mmol/L (ref 135–145)
Total Bilirubin: 0.3 mg/dL (ref 0.3–1.2)
Total Protein: 6.8 g/dL (ref 6.5–8.1)

## 2017-08-24 MED ORDER — SODIUM CHLORIDE 0.9 % IV SOLN
Freq: Once | INTRAVENOUS | Status: AC
  Administered 2017-08-24: 12:00:00 via INTRAVENOUS

## 2017-08-24 MED ORDER — SODIUM CHLORIDE 0.9% FLUSH
10.0000 mL | INTRAVENOUS | Status: DC | PRN
  Administered 2017-08-24 (×2): 10 mL
  Filled 2017-08-24 (×2): qty 10

## 2017-08-24 MED ORDER — TOPOTECAN HCL CHEMO INJECTION 4 MG
1.5000 mg/m2 | Freq: Once | INTRAVENOUS | Status: AC
  Administered 2017-08-24: 2.9 mg via INTRAVENOUS
  Filled 2017-08-24: qty 2.9

## 2017-08-24 MED ORDER — HEPARIN SOD (PORK) LOCK FLUSH 100 UNIT/ML IV SOLN
500.0000 [IU] | Freq: Once | INTRAVENOUS | Status: AC | PRN
  Administered 2017-08-24: 500 [IU]

## 2017-08-24 MED ORDER — PROCHLORPERAZINE MALEATE 10 MG PO TABS
ORAL_TABLET | ORAL | Status: AC
  Filled 2017-08-24: qty 1

## 2017-08-24 MED ORDER — PROCHLORPERAZINE MALEATE 10 MG PO TABS
10.0000 mg | ORAL_TABLET | Freq: Once | ORAL | Status: AC
  Administered 2017-08-24: 10 mg via ORAL

## 2017-08-24 NOTE — Progress Notes (Signed)
Patient for oncology follow up and topetecan today.  Patient stated he has numbness and tingling in fingers and feels like it is interfering with buttons on his shirts and dropping items.  Patient buttoned shirt for port access without help for lab work.    Patient seen by oncologist and ok to treat today.    Patient tolerated treatment with no complaints voiced.  Port site clean and dry with no complaints of pain at site.  No bruising or swelling noted at site.  Good blood return noted after administration of chemotherapy and flushed with NACL and heparin per protocol.   VSS with discharge and left ambulatory with no s/s of distress noted.

## 2017-08-24 NOTE — Patient Instructions (Signed)
[facility] Discharge Instructions for Patients Receiving Chemotherapy  Today you received the following chemotherapy agents topetecan.     If you develop nausea and vomiting that is not controlled by your nausea medication, call the clinic.   BELOW ARE SYMPTOMS THAT SHOULD BE REPORTED IMMEDIATELY:  *FEVER GREATER THAN 100.5 F  *CHILLS WITH OR WITHOUT FEVER  NAUSEA AND VOMITING THAT IS NOT CONTROLLED WITH YOUR NAUSEA MEDICATION  *UNUSUAL SHORTNESS OF BREATH  *UNUSUAL BRUISING OR BLEEDING  TENDERNESS IN MOUTH AND THROAT WITH OR WITHOUT PRESENCE OF ULCERS  *URINARY PROBLEMS  *BOWEL PROBLEMS  UNUSUAL RASH Items with * indicate a potential emergency and should be followed up as soon as possible.  Feel free to call the clinic should you have any questions or concerns. The clinic phone number is [phone].  Please show the [place] at check-in to the Emergency Department and triage nurse.

## 2017-08-24 NOTE — Patient Instructions (Signed)
[place] at [facility] Discharge Instructions  RECOMMENDATIONS MADE BY THE CONSULTANT AND ANY TEST RESULTS WILL BE SENT TO YOUR REFERRING PHYSICIAN.  Today you are receiving Topotecan. You will receive this Days 1-5.   Return to the clinic in 3 weeks with labs/chemo/follow up with MD  Thank you for choosing [place] at [facility] to provide your oncology and hematology care.  To afford each patient quality time with our provider, please arrive at least 15 minutes before your scheduled appointment time.    If you have a lab appointment with the [facility] please come in thru the  Main Entrance and check in at the main information desk  You need to re-schedule your appointment should you arrive 10 or more minutes late.  We strive to give you quality time with our providers, and arriving late affects you and other patients whose appointments are after yours.  Also, if you no show three or more times for appointments you may be dismissed from the clinic at the providers discretion.     Again, thank you for choosing [facility].  Our hope is that these requests will decrease the amount of time that you wait before being seen by our physicians.       _____________________________________________________________  Should you have questions after your visit to [facility], please contact our office at [phone] between the hours of 8:30 a.m. and 4:30 p.m.  Voicemails left after 4:30 p.m. will not be returned until the following business day.  For prescription refill requests, have your pharmacy contact our office.       Resources For Cancer Patients and their Caregivers ? American Cancer Society: Can assist with transportation, wigs, general needs, runs Look Good Feel Better.        701 742 3745 ? Cancer Care: Provides financial assistance, online support groups, medication/co-pay assistance.  1-800-813-HOPE  (404) 304-1982) ? [place] Assists [place] cancer patients and their families through emotional , educational and financial support.  307-086-6925 ? [place] DSS Where to apply for food stamps, Medicaid and utility assistance. 817-850-2385 ? [employer]: Transportation to medical appointments. 3133217010 ? Social Security Administration: May apply for disability if have a Stage IV cancer. (302) 586-8426 (814)455-8721 ? [employer], Disability and Transit Services: Assists with nutrition, care and transit needs. [place] Support Programs: @10RELATIVEDAYS @ > Cancer Support Group  2nd Tuesday of the month 1pm-2pm, Journey Room  > Creative Journey  3rd Tuesday of the month 1130am-1pm, Journey Room  > Look Good Feel Better  1st Wednesday of the month 10am-12 noon, Journey Room (Call [place] to register [phone])

## 2017-08-25 ENCOUNTER — Inpatient Hospital Stay (HOSPITAL_COMMUNITY): Payer: Medicare PPO

## 2017-08-25 ENCOUNTER — Encounter (HOSPITAL_COMMUNITY): Payer: Self-pay

## 2017-08-25 ENCOUNTER — Ambulatory Visit (HOSPITAL_COMMUNITY): Payer: Medicare PPO | Admitting: Internal Medicine

## 2017-08-25 VITALS — BP 129/68 | HR 74 | Temp 97.7°F | Resp 18

## 2017-08-25 DIAGNOSIS — M25552 Pain in left hip: Secondary | ICD-10-CM | POA: Diagnosis not present

## 2017-08-25 DIAGNOSIS — M25551 Pain in right hip: Secondary | ICD-10-CM | POA: Diagnosis not present

## 2017-08-25 DIAGNOSIS — C787 Secondary malignant neoplasm of liver and intrahepatic bile duct: Secondary | ICD-10-CM | POA: Diagnosis not present

## 2017-08-25 DIAGNOSIS — C3490 Malignant neoplasm of unspecified part of unspecified bronchus or lung: Secondary | ICD-10-CM | POA: Diagnosis not present

## 2017-08-25 DIAGNOSIS — R918 Other nonspecific abnormal finding of lung field: Secondary | ICD-10-CM | POA: Diagnosis not present

## 2017-08-25 DIAGNOSIS — G629 Polyneuropathy, unspecified: Secondary | ICD-10-CM | POA: Diagnosis not present

## 2017-08-25 DIAGNOSIS — Z5111 Encounter for antineoplastic chemotherapy: Secondary | ICD-10-CM | POA: Diagnosis not present

## 2017-08-25 DIAGNOSIS — C770 Secondary and unspecified malignant neoplasm of lymph nodes of head, face and neck: Secondary | ICD-10-CM | POA: Diagnosis not present

## 2017-08-25 DIAGNOSIS — M25512 Pain in left shoulder: Secondary | ICD-10-CM | POA: Diagnosis not present

## 2017-08-25 MED ORDER — PROCHLORPERAZINE MALEATE 10 MG PO TABS
10.0000 mg | ORAL_TABLET | Freq: Once | ORAL | Status: AC
  Administered 2017-08-25: 10 mg via ORAL
  Filled 2017-08-25: qty 1

## 2017-08-25 MED ORDER — TOPOTECAN HCL CHEMO INJECTION 4 MG
1.5000 mg/m2 | Freq: Once | INTRAVENOUS | Status: AC
  Administered 2017-08-25: 2.9 mg via INTRAVENOUS
  Filled 2017-08-25: qty 2.9

## 2017-08-25 MED ORDER — SODIUM CHLORIDE 0.9 % IV SOLN
Freq: Once | INTRAVENOUS | Status: AC
  Administered 2017-08-25: 12:00:00 via INTRAVENOUS

## 2017-08-25 MED ORDER — HEPARIN SOD (PORK) LOCK FLUSH 100 UNIT/ML IV SOLN
500.0000 [IU] | Freq: Once | INTRAVENOUS | Status: AC | PRN
  Administered 2017-08-25: 500 [IU]
  Filled 2017-08-25: qty 5

## 2017-08-25 MED ORDER — SODIUM CHLORIDE 0.9% FLUSH
10.0000 mL | INTRAVENOUS | Status: DC | PRN
  Administered 2017-08-25: 10 mL
  Filled 2017-08-25: qty 10

## 2017-08-25 NOTE — Progress Notes (Signed)
[name] tolerated Topotecan infusion well without complaints or incident. VSS upon discharge. Port flushed per protocol and left accessed with dsg dry and intact for use tomorrow. VSS upon discharge. Pt discharged self ambulatory in satisfactory condition

## 2017-08-25 NOTE — Patient Instructions (Signed)
[facility] Discharge Instructions for Patients Receiving Chemotherapy   Beginning January 23rd 2017 lab work for the [facility] will be done in the  Main lab at [facility] on 1st floor. If you have a lab appointment with the [facility] please come in thru the  Main Entrance and check in at the main information desk   Today you received the following chemotherapy agents Topotecan. Follow-up as scheduled. Call clinic for any questions or concerns  To help prevent nausea and vomiting after your treatment, we encourage you to take your nausea medication.   If you develop nausea and vomiting, or diarrhea that is not controlled by your medication, call the clinic.  The clinic phone number is [phone]. Office hours are Monday-Friday 8:30am-5:00pm.  BELOW ARE SYMPTOMS THAT SHOULD BE REPORTED IMMEDIATELY:  *FEVER GREATER THAN 101.0 F  *CHILLS WITH OR WITHOUT FEVER  NAUSEA AND VOMITING THAT IS NOT CONTROLLED WITH YOUR NAUSEA MEDICATION  *UNUSUAL SHORTNESS OF BREATH  *UNUSUAL BRUISING OR BLEEDING  TENDERNESS IN MOUTH AND THROAT WITH OR WITHOUT PRESENCE OF ULCERS  *URINARY PROBLEMS  *BOWEL PROBLEMS  UNUSUAL RASH Items with * indicate a potential emergency and should be followed up as soon as possible. If you have an emergency after office hours please contact your primary care physician or go to the nearest emergency department.  Please call the clinic during office hours if you have any questions or concerns.   You may also contact the Patient Navigator at [phone] should you have any questions or need assistance in obtaining follow up care.      Resources For Cancer Patients and their Caregivers ? American Cancer Society: Can assist with transportation, wigs, general needs, runs Look Good Feel Better.        (580)156-7188 ? Cancer Care: Provides financial assistance, online support groups, medication/co-pay assistance.  1-800-813-HOPE  682-802-6168) ? [place] Assists [place] cancer patients and their families through emotional , educational and financial support.  908-758-0015 ? [place] DSS Where to apply for food stamps, Medicaid and utility assistance. 718-211-2366 ? [employer]: Transportation to medical appointments. 864 383 8871 ? Social Security Administration: May apply for disability if have a Stage IV cancer. (724)250-4691 719-130-3584 ? [employer], Disability and Transit Services: Assists with nutrition, care and transit needs. 718-784-9306

## 2017-08-26 ENCOUNTER — Inpatient Hospital Stay (HOSPITAL_COMMUNITY): Payer: Medicare PPO

## 2017-08-26 ENCOUNTER — Encounter (HOSPITAL_COMMUNITY): Payer: Self-pay

## 2017-08-26 VITALS — BP 138/70 | HR 83 | Temp 97.6°F | Resp 18 | Wt 165.6 lb

## 2017-08-26 DIAGNOSIS — M25551 Pain in right hip: Secondary | ICD-10-CM | POA: Diagnosis not present

## 2017-08-26 DIAGNOSIS — G629 Polyneuropathy, unspecified: Secondary | ICD-10-CM | POA: Diagnosis not present

## 2017-08-26 DIAGNOSIS — M25552 Pain in left hip: Secondary | ICD-10-CM | POA: Diagnosis not present

## 2017-08-26 DIAGNOSIS — Z5111 Encounter for antineoplastic chemotherapy: Secondary | ICD-10-CM | POA: Diagnosis not present

## 2017-08-26 DIAGNOSIS — M25512 Pain in left shoulder: Secondary | ICD-10-CM | POA: Diagnosis not present

## 2017-08-26 DIAGNOSIS — C787 Secondary malignant neoplasm of liver and intrahepatic bile duct: Secondary | ICD-10-CM | POA: Diagnosis not present

## 2017-08-26 DIAGNOSIS — R918 Other nonspecific abnormal finding of lung field: Secondary | ICD-10-CM | POA: Diagnosis not present

## 2017-08-26 DIAGNOSIS — C770 Secondary and unspecified malignant neoplasm of lymph nodes of head, face and neck: Secondary | ICD-10-CM | POA: Diagnosis not present

## 2017-08-26 DIAGNOSIS — C3490 Malignant neoplasm of unspecified part of unspecified bronchus or lung: Secondary | ICD-10-CM | POA: Diagnosis not present

## 2017-08-26 MED ORDER — PROCHLORPERAZINE MALEATE 10 MG PO TABS
ORAL_TABLET | ORAL | Status: AC
  Filled 2017-08-26: qty 1

## 2017-08-26 MED ORDER — HEPARIN SOD (PORK) LOCK FLUSH 100 UNIT/ML IV SOLN
500.0000 [IU] | Freq: Once | INTRAVENOUS | Status: AC | PRN
  Administered 2017-08-26: 500 [IU]

## 2017-08-26 MED ORDER — SODIUM CHLORIDE 0.9 % IV SOLN
Freq: Once | INTRAVENOUS | Status: AC
  Administered 2017-08-26: 11:00:00 via INTRAVENOUS

## 2017-08-26 MED ORDER — PROCHLORPERAZINE MALEATE 10 MG PO TABS
10.0000 mg | ORAL_TABLET | Freq: Once | ORAL | Status: AC
  Administered 2017-08-26: 10 mg via ORAL

## 2017-08-26 MED ORDER — TOPOTECAN HCL CHEMO INJECTION 4 MG
1.5000 mg/m2 | Freq: Once | INTRAVENOUS | Status: AC
  Administered 2017-08-26: 2.9 mg via INTRAVENOUS
  Filled 2017-08-26: qty 2.9

## 2017-08-26 MED ORDER — SODIUM CHLORIDE 0.9% FLUSH
10.0000 mL | INTRAVENOUS | Status: DC | PRN
  Administered 2017-08-26: 10 mL
  Filled 2017-08-26: qty 10

## 2017-08-26 NOTE — Progress Notes (Signed)
[name] tolerated Topotecan infusion well without complaints or incident. VSS upon discharge. Pt discharged self ambulatory in satisfactory condition

## 2017-08-26 NOTE — Progress Notes (Signed)
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  Assessment and Plan:  1.  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  CT chest performed on 01/16/17 revealed extensive lymphadenopathy in chest and lower right cervical lymph node area; also indeterminate lesion in liver.  CT neck also performed on 01/16/17 also showed bulky (R) supraclavicular malignant lymph node measuring 5.4 cm, with several abnormal level 3 & 4 cervical lymph nodes and lymphadenopathy to superior mediastinum. IR attempted to place port-a-cath for anticipated chemotherapy; they were unable to place port d/t enlarged SVC and aborted procedure d/t concerns for patient safety.  Biopsy of (R) neck mass revealed small cell carcinoma, most consistent with a lung primary.   PET scan does reveal hypermetabolic liver lesion, as well as multiple sites of hypermetabolic lymphadenopathy. He understands that his cancer is treatable, but not curable.  Completed 6 cycles of cisplatin+ etoposide on 05/27/17.  PET scan done 08/13/2017  shows irregular solid hypermetabolic pulmonary nodules in the bilateral lower lobes. Additionally there are new hypermetabolic solid left upper lobe pulmonary nodule. This was unfortunately findings most compatible with progressive pulmonary metastases. Dr. Talbert Cage spoke with Dr. Grayland Ormond regarding plan of care for this patient given new findings. He recommended salvage chemotherapy beginning immediately with Topotecan.   He has undergone chemotherapy teaching and is here today for Topotecan.  Labs are adequate for chemotherapy.  He will return to clinic in 3 weeks for his next cycle of chemotherapy and evaluation.  He is advised to notify the  office if he has any problems prior to that next visit.  All questions answered and he expressed understanding of the treatment plan.  2. Smoking: He continues to smoke.  Cessation is recommended.  3.  Neuropathy: This was felt secondary to chemotherapy that was completed in November 2018. Patient received cisplatin and etoposide completed 05/27/2017.  He was started on gabapentin 300 mg at bedtime by Dr. Talbert Cage.  Will continue to monitor symptoms as therapy proceeds.  4.  Anorexia.  We will continue to monitor his weight as therapy proceeds.  Have suggested nutritional supplements for the patient.  Interval History:  Pt presented to PCP in early 01/2017 with 3-week history of right neck mass.  CT chest performed on 01/16/17 revealed extensive lymphadenopathy in chest and lower right cervical lymph node area; also indeterminate lesion in liver.  CT neck also performed on 01/16/17 also showed bulky (R) supraclavicular malignant lymph node measuring 5.4 cm, with several abnormal level 3 & 4 cervical lymph nodes and lymphadenopathy to superior mediastinum. IR attempted to place port-a-cath for anticipated chemotherapy; they were unable to place port d/t enlarged SVC and aborted procedure d/t concerns for patient safety.  Biopsy of (R) neck mass revealed small cell carcinoma, most consistent with a lung primary.   PET scan does reveal hypermetabolic liver lesion, as well as multiple sites of hypermetabolic lymphadenopathy. He understands that his cancer is treatable, but not curable.  Completed 6 cycles of cisplatin+ etoposide on 05/27/17.  PET scan done 08/13/2017  shows irregular solid hypermetabolic pulmonary nodules in the bilateral lower lobes. Additionally there are new hypermetabolic solid left upper lobe pulmonary nodule. This was unfortunately findings most compatible with progressive pulmonary metastases. Dr. Talbert Cage spoke with Dr. Grayland Ormond regarding  plan of care for this patient given new findings. He  recommended salvage chemotherapy beginning immediately with Topotecan  Current Status: Patient is seen today for evaluation prior to cycle 1 of Topotecan.   He continues to smoke.  He continues to report some problems with neuropathy.  He feels his appetite is slightly decreased.  He denies any significant breathing issues today.        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 Bulky 5.4 cm right supraclavicular region malignant lymph node conglomeration with extracapsular extension. 2. Surrounding smaller abnormal right level 3 and level 5 lymph nodes, and the lymphadenopathy continues into the superior mediastinum, see Chest CT findings reported separately. 3. No other metastatic disease identified in the neck.      01/16/2017 Imaging    CT chest: IMPRESSION: 1. Extensive lymphadenopathy in the thorax and lower right cervical region, as discussed above. Primary differential considerations include lymphoma/leukemia or small cell carcinoma of the lung. Further evaluation a PET-CT could be considered to assess for additional sites of disease below the diaphragm if clinically appropriate. Additionally, ultrasound-guided biopsy of supraclavicular lymphadenopathy could be considered to establish a tissue diagnosis. 2. Indeterminate lesion in the periphery of segment 8 of the liver measuring 2.7 x 1.7 cm. Attention at time of follow-up PET-CT is recommended. 3. Aortic atherosclerosis, in addition to left main and 3 vessel coronary artery disease. Please note that although the presence of coronary artery calcium documents the presence of coronary artery disease, the severity of this disease and any potential stenosis cannot be assessed on this non-gated CT examination. Assessment for potential risk factor modification, dietary therapy or pharmacologic therapy may be warranted, if clinically indicated. 4. There are  calcifications of the aortic valve. Echocardiographic correlation for evaluation of potential valvular dysfunction may be warranted if clinically indicated. 5. Diffuse bronchial wall thickening with moderate centrilobular and paraseptal emphysema; imaging findings suggestive of underlying COPD.      02/03/2017 Initial Biopsy    (R) neck lymph node biopsy: SMALL CELL CARCINOMA (most likely lung primary).       02/03/2017 Miscellaneous    Port-a-cath attempted by IR; unable to place d/t enlarged SVC.       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 Staging for metastatic disease  EXAM: MRI HEAD WITHOUT AND WITH CONTRAST  TECHNIQUE: Multiplanar, multiecho pulse sequences of the brain and surrounding structures were obtained without and with intravenous contrast.  CONTRAST:  12m MULTIHANCE GADOBENATE DIMEGLUMINE 529 MG/ML IV SOLN  COMPARISON:  None.  FINDINGS: Brain: Negative for hydrocephalus. Cerebral volume normal for age. Small nonenhancing white matter hyperintensities consistent with mild chronic microvascular ischemia. No acute infarct. Negative for hemorrhage or mass or edema  Normal enhancement postcontrast infusion. No enhancing mass lesion. Leptomeningeal enhancement is normal.  Vascular: Normal arterial flow voids.  Normal venous enhancement  Skull and upper cervical spine: Negative  Sinuses/Orbits: Negative  Other: None  IMPRESSION: Negative for metastatic disease.  No acute abnormality.  Mild chronic white matter changes.      04/07/2017 Imaging     PET:  1. Marked reduction in size and metabolic activity of bulky RIGHT supraclavicular  adenopathy mediastinal lymphadenopathy. 2. Residual moderate activity remains within  small RIGHT supraclavicular lymph node, RIGHT lower paratracheal lymph node and RIGHT hilar lymph node. 3. Resolution of prevascular and internal mammary mediastinal metastatic hypermetabolic activity. 4. Resolution of metabolic activity associated with solitary RIGHT hepatic lobe liver metastasis. 5. No evidence of disease progression. 6. 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      05/27/2017 Imaging    MRI brain w/ and w/o contrast IMPRESSION: 1. No metastatic disease identified. 2. Increased nonspecific cerebral white matter signal changes since August. These are most commonly small vessel disease related. 3. New right maxillary sinusitis. Benign appearing retention cysts in the nasopharynx with trace mastoid effusions.      06/12/2017 Imaging    PET-CT IMPRESSION: 1. There are two new hypermetabolic nodules identified within both lower lobes measuring up to 3.1 cm. The appearance is nonspecific and may be inflammatory/infectious in etiology. Pulmonary metastatic disease cannot be excluded and short-term follow-up imaging in 3 months is advised to reassess these nodules. 2. Stable appearance of mild hypermetabolic activity associated with right paratracheal and right hilar lymph nodes. 3. Decrease in FDG uptake associated with index right supraclavicular lymph node. 4. 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 5. Aortic Atherosclerosis (ICD10-I70.0) and Emphysema (ICD10-J43.9).      Problem List Patient Active Problem List   Diagnosis Date Noted  . 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 Lymphadenopathy of head and neck [R59.1] 01/22/2017  . Osteopenia [M85.80] 03/30/2015  . Clubbing of fingers [R68.3] 04/14/2014  . Hyperglycemia  [R73.9] 10/20/2013  . Smoker [F17.200]   . Closed dislocation of acromioclavicular joint [S43.109A] 04/11/2010  . BREAST MASS, LEFT [N63.0] 01/20/2008  . ONYCHOMYCOSIS, TOENAILS [B35.1] 10/20/2007  . MUSCLE CRAMPS [R25.2] 10/20/2007  . Hypercholesterolemia [E78.00] 05/28/2007  . CATARACT NOS [H26.9] 08/28/2006  . DISTURBANCE, VISUAL NOS [H53.9] 08/19/2006  . 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 WITHDRAWAL, DRUG [F19.939] 06/24/2006  . ANXIETY [F41.1] 06/24/2006  . DEPRESSION [F32.9] 06/24/2006  . MYOCARDIAL INFARCTION, HX OF [I25.2] 06/24/2006  . Coronary atherosclerosis [I25.10] 06/24/2006  . CARDIAC ARRHYTHMIA [I49.9] 06/24/2006  . CONSTIPATION [K59.00] 06/24/2006  . OSTEOARTHRITIS [M19.90] 06/24/2006  . LOW BACK PAIN [M54.5] 06/24/2006  . INSOMNIA [G47.00] 06/24/2006    Past Medical History Past Medical History:  Diagnosis Date  . Anxiety   . CAD (coronary artery disease)   . Cancer ([place])    stage 4 small cell lung cancer   . COPD (chronic obstructive pulmonary disease) ([place])   . Depression   . Dyspnea    increased exertion  . Feeling of chest tightness   . Heart palpitations   . History of chemotherapy   . Myocardial infarction ([place])   . Osteopenia   . Panic attacks   . Smoker     Past Surgical History Past Surgical History:  Procedure Laterality Date  . BACK SURGERY  12/24/2000   L5,S1  . CORONARY STENT PLACEMENT  2005   RCA & CX  . HERNIA REPAIR Right 1980's  . INGUINAL HERNIA REPAIR  12/1978   right side  . IR FLUORO GUIDE PORT INSERTION RIGHT  04/02/2017  . IR US GUIDE BX ASP/DRAIN  02/03/2017  . IR US GUIDE VASC ACCESS RIGHT  04/02/2017  . NM  MYOCAR PERF WALL MOTION  09/07/2009   No ischemia; EF 51%  . SHOULDER SURGERY Left 08/2010  . SPINE SURGERY  2002   L5-S1    Family History Family History  Problem Relation Age of Onset  . Heart attack Father   . Kidney disease Father        renal failure  .  Heart failure Mother   . Heart attack Mother   . Cancer Brother   . Diabetes Brother   . Alcohol abuse Brother   . Diabetes Sister      Social History  reports that he has been smoking cigarettes.  He has been smoking about 1.00 pack per day. he has never used smokeless tobacco. He reports that he drinks alcohol. He reports that he does not use drugs.  Medications  Current Outpatient Medications:  .  ALPRAZolam (XANAX) 1 MG tablet, Take 1 tablet (1 mg total) by mouth 4 (four) times daily., Disp: 120 tablet, Rfl: 2 .  atorvastatin (LIPITOR) 10 MG tablet, TAKE 1 TABLET AT BEDTIME  (DISCONTINUE ATORVASTATIN 20MG), Disp: 90 tablet, Rfl: 3 .  clotrimazole-betamethasone (LOTRISONE) cream, Apply 1 application topically 2 (two) times daily., Disp: 30 g, Rfl: 0 .  cyclobenzaprine (FLEXERIL) 5 MG tablet, Take 1 tablet (5 mg total) 3 (three) times daily as needed by mouth for muscle spasms., Disp: 30 tablet, Rfl: 1 .  dexamethasone (DECADRON) 4 MG tablet, , Disp: , Rfl:  .  gabapentin (NEURONTIN) 300 MG capsule, Take 1 capsule (300 mg total) by mouth at bedtime., Disp: 30 capsule, Rfl: 1 .  HYDROcodone-acetaminophen (NORCO) 5-325 MG tablet, Take 1 tablet by mouth every 6 (six) hours as needed for moderate pain., Disp: 60 tablet, Rfl: 0 .  lidocaine-prilocaine (EMLA) cream, Apply to affected area once, Disp: 30 g, Rfl: 3 .  ondansetron (ZOFRAN) 8 MG tablet, Take 1 tablet (8 mg total) 2 (two) times daily as needed by mouth. Start on the third day after cisplatin chemotherapy., Disp: 30 tablet, Rfl: 1 .  Pegfilgrastim (NEULASTA ONPRO [place]), Inject into the skin. Every 21 days, Disp: , Rfl:  .  polyethylene glycol powder (GLYCOLAX/MIRALAX) powder, Take 17 g by mouth daily., Disp: 3350 g, Rfl: 2 .  prochlorperazine (COMPAZINE) 10 MG tablet, Take 1 tablet (10 mg total) by mouth every 6 (six) hours as needed (Nausea or vomiting)., Disp: 30 tablet, Rfl: 1 .  topotecan in sodium chloride 0.9 % 100 mL, Inject  into the vein once. Days 1-5 every 21 days, Disp: , Rfl:  .  traZODone (DESYREL) 100 MG tablet, Take 400 mg by mouth at bedtime., Disp: , Rfl:   Allergies Codeine and Niaspan [niacin er]  Review of Systems Review of Systems - Oncology ROS as per HPI otherwise 12 point ROS is negative other than dizziness, neuropathy, minor constipation and bruising.   Physical Exam  Vitals Wt Readings from Last 3 Encounters:  08/24/17 169 lb 6.4 oz (76.8 kg)  08/17/17 168 lb 1.6 oz (76.2 kg)  06/18/17 172 lb (78 kg)   Temp Readings from Last 3 Encounters:  08/25/17 97.7 F (36.5 C) (Oral)  08/24/17 97.6 F (36.4 C) (Oral)  08/24/17 (!) 97.5 F (36.4 C) (Oral)   BP Readings from Last 3 Encounters:  08/25/17 129/68  08/24/17 (!) 144/63  08/24/17 (!) 124/54   Pulse Readings from Last 3 Encounters:  08/25/17 74  08/24/17 75  08/24/17 87    Constitutional: Well-developed, well-nourished, and in no distress.   HENT:  Head: Normocephalic and atraumatic.  Mouth/Throat: No oropharyngeal exudate. Mucosa moist. Eyes: Pupils are equal, round, and reactive to light. Conjunctivae are normal. No scleral icterus.  Neck: Normal range of motion. Neck supple. No JVD present.  Cardiovascular: Normal rate, regular rhythm and normal heart sounds.  Exam reveals no gallop and no friction rub.   No murmur heard. Pulmonary/Chest: Effort normal and breath sounds normal. No respiratory distress.  Coarse breath sounds.  No wheezes.No rales.  Abdominal: Soft. Bowel sounds are normal. No distension. There is no tenderness. There is no guarding.  Musculoskeletal: No edema or tenderness.  Lymphadenopathy: No cervical, axillary or supraclavicular adenopathy.  Neurological: Alert and oriented to person, place, and time. No cranial nerve deficit.  Skin: Skin is warm and dry. No rash noted. No erythema. No pallor.  Psychiatric: Affect and judgment normal.   Labs Infusion on 08/24/2017  Component Date Value Ref Range  Status  . Sodium 08/24/2017 136  135 - 145 mmol/L Final  . Potassium 08/24/2017 4.2  3.5 - 5.1 mmol/L Final  . Chloride 08/24/2017 105  101 - 111 mmol/L Final  . CO2 08/24/2017 22  22 - 32 mmol/L Final  . Glucose, Bld 08/24/2017 158* 65 - 99 mg/dL Final  . BUN 08/24/2017 19  6 - 20 mg/dL Final  . Creatinine, Ser 08/24/2017 0.92  0.61 - 1.24 mg/dL Final  . Calcium 08/24/2017 8.8* 8.9 - 10.3 mg/dL Final  . Total Protein 08/24/2017 6.8  6.5 - 8.1 g/dL Final  . Albumin 08/24/2017 3.5  3.5 - 5.0 g/dL Final  . AST 08/24/2017 18  15 - 41 U/L Final  . ALT 08/24/2017 17  17 - 63 U/L Final  . Alkaline Phosphatase 08/24/2017 62  38 - 126 U/L Final  . Total Bilirubin 08/24/2017 0.3  0.3 - 1.2 mg/dL Final  . GFR calc non Af Amer 08/24/2017 >60  >60 mL/min Final  . GFR calc Af Amer 08/24/2017 >60  >60 mL/min Final   Comment: (NOTE) The eGFR has been calculated using the CKD EPI equation. This calculation has not been validated in all clinical situations. eGFR's persistently <60 mL/min signify possible Chronic Kidney Disease.   Georgiann Hahn gap 08/24/2017 9  5 - 15 Final   Performed at [facility], [address] [place], [place] [address]  . WBC 08/24/2017 5.4  4.0 - 10.5 K/uL Final  . RBC 08/24/2017 4.09* 4.22 - 5.81 MIL/uL Final  . Hemoglobin 08/24/2017 12.4* 13.0 - 17.0 g/dL Final  . HCT 08/24/2017 38.9* 39.0 - 52.0 % Final  . MCV 08/24/2017 95.1  78.0 - 100.0 fL Final  . MCH 08/24/2017 30.3  26.0 - 34.0 pg Final  . MCHC 08/24/2017 31.9  30.0 - 36.0 g/dL Final  . RDW 08/24/2017 15.3  11.5 - 15.5 % Final  . Platelets 08/24/2017 176  150 - 400 K/uL Final  . Neutrophils Relative % 08/24/2017 67  % Final  . Neutro Abs 08/24/2017 3.6  1.7 - 7.7 K/uL Final  . Lymphocytes Relative 08/24/2017 24  % Final  . Lymphs Abs 08/24/2017 1.3  0.7 - 4.0 K/uL Final  . Monocytes Relative 08/24/2017 7  % Final  . Monocytes Absolute 08/24/2017 0.4  0.1 - 1.0 K/uL Final  . Eosinophils Relative 08/24/2017 2  %  Final  . Eosinophils Absolute 08/24/2017 0.1  0.0 - 0.7 K/uL Final  . Basophils Relative 08/24/2017 0  % Final  . Basophils Absolute 08/24/2017 0.0  0.0 - 0.1 K/uL  Final   Performed at [facility], [address] [place], [place] [address]    DIAGNOSTIC IMAGING:   PET scan 08/13/17   IMPRESSION: 1. Irregular solid hypermetabolic pulmonary nodules in the bilateral lower lobes have increased in density and metabolism. New hypermetabolic solid left upper lobe pulmonary nodule. Unfortunately, these findings are most compatible with progressive pulmonary metastases. 2. Stable hypermetabolic right hilar and right paratracheal mediastinal adenopathy. 3. Stable hypermetabolic small lower right superficial parotid nodule, most compatible with primary parotid neoplasm. 4. Nonspecific focal hypermetabolism at the anorectal junction, increased. Although potentially physiologic, correlation with clinical evaluation is recommended to exclude neoplasm in this location. 5. Chronic findings include: Aortic Atherosclerosis (ICD10-I70.0) and Emphysema (ICD10-J43.9). Three-vessel coronary atherosclerosis. Chronic right maxillary sinusitis. Mild sigmoid diverticulosis.  CT neck: 01/16/17      MRI brain: 02/11/17 CLINICAL DATA:  Advanced stage small cell lung cancer. Staging for metastatic disease  EXAM: MRI HEAD WITHOUT AND WITH CONTRAST  TECHNIQUE: Multiplanar, multiecho pulse sequences of the brain and surrounding structures were obtained without and with intravenous contrast.  CONTRAST:  46m MULTIHANCE GADOBENATE DIMEGLUMINE 529 MG/ML IV SOLN  COMPARISON:  None.  FINDINGS: Brain: Negative for hydrocephalus. Cerebral volume normal for age. Small nonenhancing white matter hyperintensities consistent with mild chronic microvascular ischemia. No acute infarct. Negative for hemorrhage or mass or edema  Normal enhancement postcontrast infusion. No enhancing mass  lesion. Leptomeningeal enhancement is normal.  Vascular: Normal arterial flow voids.  Normal venous enhancement  Skull and upper cervical spine: Negative  Sinuses/Orbits: Negative  Other: None  IMPRESSION: Negative for metastatic disease.  No acute abnormality.  Mild chronic white matter changes.   Electronically Signed   By: CFranchot GalloM.D.   On: 02/11/2017 15:35  PATHOLOGY:  (R) neck lymph node biopsy: 02/03/17    Orders Placed This Encounter  Procedures  . CBC with Differential/Platelet    Standing Status:   Future    Standing Expiration Date:   09/14/2017  . Comprehensive metabolic panel    Standing Status:   Future    Standing Expiration Date:   09/14/2017  . Lactate dehydrogenase    Standing Status:   Future    Standing Expiration Date:   09/14/2017       VZoila ShutterMD

## 2017-08-26 NOTE — Patient Instructions (Signed)
[facility] Discharge Instructions for Patients Receiving Chemotherapy   Beginning January 23rd 2017 lab work for the [facility] will be done in the  Main lab at [facility] on 1st floor. If you have a lab appointment with the [facility] please come in thru the  Main Entrance and check in at the main information desk   Today you received the following chemotherapy agents Topotecan Follow-up as scheduled. Call clinic for any questions or concerns  To help prevent nausea and vomiting after your treatment, we encourage you to take your nausea medication   If you develop nausea and vomiting, or diarrhea that is not controlled by your medication, call the clinic.  The clinic phone number is [phone]. Office hours are Monday-Friday 8:30am-5:00pm.  BELOW ARE SYMPTOMS THAT SHOULD BE REPORTED IMMEDIATELY:  *FEVER GREATER THAN 101.0 F  *CHILLS WITH OR WITHOUT FEVER  NAUSEA AND VOMITING THAT IS NOT CONTROLLED WITH YOUR NAUSEA MEDICATION  *UNUSUAL SHORTNESS OF BREATH  *UNUSUAL BRUISING OR BLEEDING  TENDERNESS IN MOUTH AND THROAT WITH OR WITHOUT PRESENCE OF ULCERS  *URINARY PROBLEMS  *BOWEL PROBLEMS  UNUSUAL RASH Items with * indicate a potential emergency and should be followed up as soon as possible. If you have an emergency after office hours please contact your primary care physician or go to the nearest emergency department.  Please call the clinic during office hours if you have any questions or concerns.   You may also contact the Patient Navigator at [phone] should you have any questions or need assistance in obtaining follow up care.      Resources For Cancer Patients and their Caregivers ? American Cancer Society: Can assist with transportation, wigs, general needs, runs Look Good Feel Better.        ([phone] ? Cancer Care: Provides financial assistance, online support groups, medication/co-pay assistance.  1-800-813-HOPE  403-339-7859) ? [facility] Assists [place] cancer patients and their families through emotional , educational and financial support.  435-595-0974 ? [place] DSS Where to apply for food stamps, Medicaid and utility assistance. 6711816678 ? [facility]: Transportation to medical appointments. 334-156-2003 ? Social Security Administration: May apply for disability if have a Stage IV cancer. 479-144-3678 [phone] ? [employer], Disability and Transit Services: Assists with nutrition, care and transit needs. [phone]

## 2017-08-27 ENCOUNTER — Inpatient Hospital Stay (HOSPITAL_COMMUNITY): Payer: Medicare PPO

## 2017-08-27 VITALS — BP 126/55 | HR 85 | Temp 97.7°F | Resp 20

## 2017-08-27 DIAGNOSIS — Z5111 Encounter for antineoplastic chemotherapy: Secondary | ICD-10-CM | POA: Diagnosis not present

## 2017-08-27 DIAGNOSIS — C787 Secondary malignant neoplasm of liver and intrahepatic bile duct: Secondary | ICD-10-CM | POA: Diagnosis not present

## 2017-08-27 DIAGNOSIS — M25512 Pain in left shoulder: Secondary | ICD-10-CM | POA: Diagnosis not present

## 2017-08-27 DIAGNOSIS — C3490 Malignant neoplasm of unspecified part of unspecified bronchus or lung: Secondary | ICD-10-CM | POA: Diagnosis not present

## 2017-08-27 DIAGNOSIS — R918 Other nonspecific abnormal finding of lung field: Secondary | ICD-10-CM | POA: Diagnosis not present

## 2017-08-27 DIAGNOSIS — C770 Secondary and unspecified malignant neoplasm of lymph nodes of head, face and neck: Secondary | ICD-10-CM | POA: Diagnosis not present

## 2017-08-27 DIAGNOSIS — M25552 Pain in left hip: Secondary | ICD-10-CM | POA: Diagnosis not present

## 2017-08-27 DIAGNOSIS — M25551 Pain in right hip: Secondary | ICD-10-CM | POA: Diagnosis not present

## 2017-08-27 DIAGNOSIS — G629 Polyneuropathy, unspecified: Secondary | ICD-10-CM | POA: Diagnosis not present

## 2017-08-27 MED ORDER — PROCHLORPERAZINE MALEATE 10 MG PO TABS
10.0000 mg | ORAL_TABLET | Freq: Once | ORAL | Status: AC
  Administered 2017-08-27: 10 mg via ORAL
  Filled 2017-08-27: qty 1

## 2017-08-27 MED ORDER — SODIUM CHLORIDE 0.9 % IV SOLN
Freq: Once | INTRAVENOUS | Status: AC
  Administered 2017-08-27: 12:00:00 via INTRAVENOUS

## 2017-08-27 MED ORDER — TOPOTECAN HCL CHEMO INJECTION 4 MG
1.5000 mg/m2 | Freq: Once | INTRAVENOUS | Status: AC
  Administered 2017-08-27: 2.9 mg via INTRAVENOUS
  Filled 2017-08-27: qty 2.9

## 2017-08-28 ENCOUNTER — Inpatient Hospital Stay (HOSPITAL_COMMUNITY): Payer: Medicare PPO

## 2017-08-28 ENCOUNTER — Encounter (HOSPITAL_COMMUNITY): Payer: Self-pay

## 2017-08-28 VITALS — BP 116/59 | HR 84 | Temp 97.5°F | Resp 18

## 2017-08-28 DIAGNOSIS — G629 Polyneuropathy, unspecified: Secondary | ICD-10-CM | POA: Diagnosis not present

## 2017-08-28 DIAGNOSIS — M25552 Pain in left hip: Secondary | ICD-10-CM | POA: Diagnosis not present

## 2017-08-28 DIAGNOSIS — Z5111 Encounter for antineoplastic chemotherapy: Secondary | ICD-10-CM | POA: Diagnosis not present

## 2017-08-28 DIAGNOSIS — C770 Secondary and unspecified malignant neoplasm of lymph nodes of head, face and neck: Secondary | ICD-10-CM | POA: Diagnosis not present

## 2017-08-28 DIAGNOSIS — C3490 Malignant neoplasm of unspecified part of unspecified bronchus or lung: Secondary | ICD-10-CM | POA: Diagnosis not present

## 2017-08-28 DIAGNOSIS — R918 Other nonspecific abnormal finding of lung field: Secondary | ICD-10-CM | POA: Diagnosis not present

## 2017-08-28 DIAGNOSIS — M25551 Pain in right hip: Secondary | ICD-10-CM | POA: Diagnosis not present

## 2017-08-28 DIAGNOSIS — C787 Secondary malignant neoplasm of liver and intrahepatic bile duct: Secondary | ICD-10-CM | POA: Diagnosis not present

## 2017-08-28 DIAGNOSIS — M25512 Pain in left shoulder: Secondary | ICD-10-CM | POA: Diagnosis not present

## 2017-08-28 MED ORDER — HEPARIN SOD (PORK) LOCK FLUSH 100 UNIT/ML IV SOLN
500.0000 [IU] | Freq: Once | INTRAVENOUS | Status: AC | PRN
  Administered 2017-08-28: 500 [IU]
  Filled 2017-08-28: qty 5

## 2017-08-28 MED ORDER — TOPOTECAN HCL CHEMO INJECTION 4 MG
1.5000 mg/m2 | Freq: Once | INTRAVENOUS | Status: AC
  Administered 2017-08-28: 2.9 mg via INTRAVENOUS
  Filled 2017-08-28: qty 2.9

## 2017-08-28 MED ORDER — PROCHLORPERAZINE MALEATE 10 MG PO TABS
ORAL_TABLET | ORAL | Status: AC
  Filled 2017-08-28: qty 1

## 2017-08-28 MED ORDER — SODIUM CHLORIDE 0.9 % IV SOLN
Freq: Once | INTRAVENOUS | Status: AC
  Administered 2017-08-28: 12:00:00 via INTRAVENOUS

## 2017-08-28 MED ORDER — PEGFILGRASTIM 6 MG/0.6ML ~~LOC~~ PSKT
6.0000 mg | PREFILLED_SYRINGE | Freq: Once | SUBCUTANEOUS | Status: AC
  Administered 2017-08-28: 6 mg via SUBCUTANEOUS
  Filled 2017-08-28: qty 0.6

## 2017-08-28 MED ORDER — SODIUM CHLORIDE 0.9% FLUSH
10.0000 mL | INTRAVENOUS | Status: DC | PRN
  Administered 2017-08-28: 10 mL
  Filled 2017-08-28: qty 10

## 2017-08-28 MED ORDER — PROCHLORPERAZINE MALEATE 10 MG PO TABS
10.0000 mg | ORAL_TABLET | Freq: Once | ORAL | Status: AC
  Administered 2017-08-28: 10 mg via ORAL

## 2017-08-28 NOTE — Patient Instructions (Signed)
[facility] Discharge Instructions for Patients Receiving Chemotherapy   Beginning January 23rd 2017 lab work for the [facility] will be done in the  Main lab at [facility] on 1st floor. If you have a lab appointment with the [facility] please come in thru the  Main Entrance and check in at the main information desk   Today you received the following chemotherapy agents Topotecan as well as Neulasta on-pro. Follow-up as scheduled. Call clinic for any questions or concerns  To help prevent nausea and vomiting after your treatment, we encourage you to take your nausea medication   If you develop nausea and vomiting, or diarrhea that is not controlled by your medication, call the clinic.  The clinic phone number is [phone]. Office hours are Monday-Friday 8:30am-5:00pm.  BELOW ARE SYMPTOMS THAT SHOULD BE REPORTED IMMEDIATELY:  *FEVER GREATER THAN 101.0 F  *CHILLS WITH OR WITHOUT FEVER  NAUSEA AND VOMITING THAT IS NOT CONTROLLED WITH YOUR NAUSEA MEDICATION  *UNUSUAL SHORTNESS OF BREATH  *UNUSUAL BRUISING OR BLEEDING  TENDERNESS IN MOUTH AND THROAT WITH OR WITHOUT PRESENCE OF ULCERS  *URINARY PROBLEMS  *BOWEL PROBLEMS  UNUSUAL RASH Items with * indicate a potential emergency and should be followed up as soon as possible. If you have an emergency after office hours please contact your primary care physician or go to the nearest emergency department.  Please call the clinic during office hours if you have any questions or concerns.   You may also contact the Patient Navigator at [phone] should you have any questions or need assistance in obtaining follow up care.      Resources For Cancer Patients and their Caregivers ? [employer]: Can assist with transportation, wigs, general needs, runs Look Good Feel Better.        941 423 9677 ? Cancer Care: Provides financial assistance, online support groups, medication/co-pay  assistance.  1-800-813-HOPE 956-469-9367) ? [facility] Assists [place] cancer patients and their families through emotional , educational and financial support.  (915) 241-8809 ? [place] DSS Where to apply for food stamps, Medicaid and utility assistance. 346-019-1180 ? [facility]: Transportation to medical appointments. (724)343-0229 ? Social Security Administration: May apply for disability if have a Stage IV cancer. 507-148-2401 281-228-3462 ? [employer], Disability and Transit Services: Assists with nutrition, care and transit needs. ([phone]

## 2017-08-28 NOTE — Progress Notes (Signed)
Harvie Junior tolerated Topotecan infusion and Neulasta on-pro well without complaints or incident. VSS upon discharge. Neulasta on-pro applied to pt's right arm with green indicator light flashing. Pt discharged self ambulatory in satisfactory condition

## 2017-08-31 ENCOUNTER — Telehealth (HOSPITAL_COMMUNITY): Payer: Self-pay

## 2017-08-31 NOTE — Telephone Encounter (Signed)
24 hr F/U chemo call Left message for pt to call us back if he has any problems or questions

## 2017-09-02 ENCOUNTER — Other Ambulatory Visit (HOSPITAL_COMMUNITY): Payer: Medicare PPO

## 2017-09-02 ENCOUNTER — Ambulatory Visit (HOSPITAL_COMMUNITY): Payer: Medicare PPO | Admitting: Adult Health

## 2017-09-03 ENCOUNTER — Ambulatory Visit (HOSPITAL_COMMUNITY): Payer: Medicare PPO | Admitting: Adult Health

## 2017-09-03 ENCOUNTER — Other Ambulatory Visit (HOSPITAL_COMMUNITY): Payer: Medicare PPO

## 2017-09-08 ENCOUNTER — Ambulatory Visit (HOSPITAL_COMMUNITY): Payer: Medicare PPO | Admitting: Adult Health

## 2017-09-08 NOTE — Progress Notes (Signed)
[place], [place] [address]   CLINIC:  Medical Oncology/Hematology  PCP:  [name], MD [address] 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 Bulky 5.4 cm right supraclavicular region malignant lymph node conglomeration with extracapsular extension. 2. Surrounding smaller abnormal right level 3 and level 5 lymph nodes, and the lymphadenopathy continues into the superior mediastinum, see Chest CT findings reported separately. 3. No other metastatic disease identified in the neck.      01/16/2017 Imaging    CT chest: IMPRESSION: 1. Extensive lymphadenopathy in the thorax and lower right cervical region, as discussed above. Primary differential considerations include lymphoma/leukemia or small cell carcinoma of the lung. Further evaluation a PET-CT could be considered to assess for additional sites of disease below the diaphragm if clinically appropriate. Additionally, ultrasound-guided biopsy of supraclavicular lymphadenopathy could be considered to establish a tissue diagnosis. 2. Indeterminate lesion in the periphery of segment 8 of the liver measuring 2.7 x 1.7 cm. Attention at time of follow-up PET-CT is recommended. 3. Aortic atherosclerosis, in addition to left main and 3 vessel coronary artery disease. Please note that although the presence of coronary artery calcium documents the presence of coronary artery disease, the severity of this disease and any potential stenosis cannot be assessed on this non-gated CT examination. Assessment for potential risk factor modification, dietary therapy or pharmacologic therapy may be warranted, if  clinically indicated. 4. There are calcifications of the aortic valve. Echocardiographic correlation for evaluation of potential valvular dysfunction may be warranted if clinically indicated. 5. Diffuse bronchial wall thickening with moderate centrilobular and paraseptal emphysema; imaging findings suggestive of underlying COPD.      02/03/2017 Initial Biopsy    (R) neck lymph node biopsy: SMALL CELL CARCINOMA (most likely lung primary).       02/03/2017 Miscellaneous    Port-a-cath attempted by IR; unable to place d/t enlarged SVC.       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 Staging for metastatic disease  EXAM: MRI HEAD WITHOUT AND WITH CONTRAST  TECHNIQUE: Multiplanar, multiecho pulse sequences of the brain and surrounding structures were obtained without and with intravenous contrast.  CONTRAST:  42mL MULTIHANCE GADOBENATE DIMEGLUMINE 529 MG/ML IV SOLN  COMPARISON:  None.  FINDINGS: Brain: Negative for hydrocephalus. Cerebral volume normal for age. Small nonenhancing white matter hyperintensities consistent with mild chronic microvascular ischemia. No acute infarct. Negative for hemorrhage or mass or edema  Normal enhancement postcontrast infusion. No enhancing mass lesion. Leptomeningeal enhancement is normal.  Vascular: Normal arterial flow voids.  Normal venous enhancement  Skull and upper cervical spine: Negative  Sinuses/Orbits: Negative  Other: None  IMPRESSION: Negative for metastatic disease.  No acute abnormality.  Mild chronic white matter changes.      04/07/2017 Imaging     PET:  1. Marked reduction in size and metabolic activity of bulky RIGHT supraclavicular adenopathy  mediastinal lymphadenopathy. 2.  Residual moderate activity remains within small RIGHT supraclavicular lymph node, RIGHT lower paratracheal lymph node and RIGHT hilar lymph node. 3. Resolution of prevascular and internal mammary mediastinal metastatic hypermetabolic activity. 4. Resolution of metabolic activity associated with solitary RIGHT hepatic lobe liver metastasis. 5. No evidence of disease progression. 6. 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      05/27/2017 Imaging    MRI brain w/ and w/o contrast IMPRESSION: 1. No metastatic disease identified. 2. Increased nonspecific cerebral white matter signal changes since August. These are most commonly small vessel disease related. 3. New right maxillary sinusitis. Benign appearing retention cysts in the nasopharynx with trace mastoid effusions.      06/12/2017 Imaging    PET-CT IMPRESSION: 1. There are two new hypermetabolic nodules identified within both lower lobes measuring up to 3.1 cm. The appearance is nonspecific and may be inflammatory/infectious in etiology. Pulmonary metastatic disease cannot be excluded and short-term follow-up imaging in 3 months is advised to reassess these nodules. 2. Stable appearance of mild hypermetabolic activity associated with right paratracheal and right hilar lymph nodes. 3. Decrease in FDG uptake associated with index right supraclavicular lymph node. 4. 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 5. Aortic Atherosclerosis (ICD10-I70.0) and Emphysema (ICD10-J43.9).         INTERVAL HISTORY:  Mr. Geiger 67 y.o. male returns to cancer center for follow-up for extensive stage small cell lung cancer.   S/p cycle #1 Topotecan days 1-5 from 08/24/17-08/28/17.   Here today unaccompanied.  Overall, he tells me he has been  feeling "rough" since his chemotherapy.  Appetite 25%; energy level is 50%.  He generally drinks about 2 boosts per day to supplement his diet.  His biggest complaint today is worsening joint pain to his hips, shoulders, and low back.  Pain is worse when he goes from a seated to standing position, and pain improves as he is up and moving about.  He generally takes Norco 1-2 times per day.  He tells me he had to take 2 tablets last evening, "because my pain was so bad."  He is requesting refill of his pain medications today "because I will run out sometime next week."  He has chronic issues with constipation; this is managed with MiraLAX and he tries to drink plenty of fluids.  His last bowel movement was yesterday.  Does report increase in nausea with this chemo regimen.  He did have episode of vomiting this morning after he drink his boost.  He thinks the boost contributed to his nausea.  Denies any diarrhea.  Denies any bleeding episodes that he is aware of.  Denies any fever or chills.    He continues to struggle with chronic dizziness, which is been a long-standing issue for him; dizziness worse with position changes and going from seated to standing.  Remains on gabapentin for peripheral neuropathy to his feet and hands; he feels like the symptoms are slowly improving.      REVIEW OF SYSTEMS:  Review of Systems  Constitutional: Positive for fatigue. Negative for chills and fever.  HENT:  Negative.   Eyes: Negative.   Respiratory: Negative.  Negative for cough and shortness of breath.   Cardiovascular: Negative.  Negative for chest pain.  Gastrointestinal: Positive for constipation, nausea and vomiting. Negative for blood in stool and  diarrhea.  Endocrine: Negative.   Genitourinary: Negative.    Musculoskeletal: Positive for arthralgias and back pain.  Skin: Negative.  Negative for rash.  Neurological: Positive for dizziness, extremity weakness and numbness. Negative for headaches.    Hematological: Bruises/bleeds easily.  Psychiatric/Behavioral: Negative.      PAST MEDICAL/SURGICAL HISTORY:  Past Medical History:  Diagnosis Date  . Anxiety   . CAD (coronary artery disease)   . Cancer ([facility])    stage 4 small cell lung cancer   . COPD (chronic obstructive pulmonary disease) ([facility])   . Depression   . Dyspnea    increased exertion  . Feeling of chest tightness   . Heart palpitations   . History of chemotherapy   . Myocardial infarction ([facility])   . Osteopenia   . Panic attacks   . Smoker    Past Surgical History:  Procedure Laterality Date  . BACK SURGERY  12/24/2000   L5,S1  . CORONARY STENT PLACEMENT  2005   RCA & CX  . HERNIA REPAIR Right 1980's  . INGUINAL HERNIA REPAIR  12/1978   right side  . IR FLUORO GUIDE PORT INSERTION RIGHT  04/02/2017  . IR US GUIDE BX ASP/DRAIN  02/03/2017  . IR US GUIDE VASC ACCESS RIGHT  04/02/2017  . NM MYOCAR PERF WALL MOTION  09/07/2009   No ischemia; EF 51%  . SHOULDER SURGERY Left 08/2010  . SPINE SURGERY  2002   L5-S1     SOCIAL HISTORY:  Social History   Socioeconomic History  . Marital status: Single    Spouse name: Not on file  . Number of children: Not on file  . Years of education: Not on file  . Highest education level: Not on file  Social Needs  . Financial resource strain: Not on file  . Food insecurity - worry: Not on file  . Food insecurity - inability: Not on file  . Transportation needs - medical: Not on file  . Transportation needs - non-medical: Not on file  Occupational History  . Not on file  Tobacco Use  . Smoking status: Current Every Day Smoker    Packs/day: 1.00    Types: Cigarettes  . Smokeless tobacco: Never Used  Substance and Sexual Activity  . Alcohol use: Yes    Comment: occas  . Drug use: No  . Sexual activity: Not on file  Other Topics Concern  . Not on file  Social History Narrative  . Not on file    FAMILY HISTORY:  Family History  Problem Relation Age of Onset  .  Heart attack Father   . Kidney disease Father        renal failure  . Heart failure Mother   . Heart attack Mother   . Cancer Brother   . Diabetes Brother   . Alcohol abuse Brother   . Diabetes Sister     CURRENT MEDICATIONS:  Outpatient Encounter Medications as of 09/09/2017  Medication Sig  . ALPRAZolam (XANAX) 1 MG tablet Take 1 tablet (1 mg total) by mouth 4 (four) times daily.  Marland Kitchen atorvastatin (LIPITOR) 10 MG tablet TAKE 1 TABLET AT BEDTIME  (DISCONTINUE ATORVASTATIN 20MG )  . clotrimazole-betamethasone (LOTRISONE) cream Apply 1 application topically 2 (two) times daily.  . cyclobenzaprine (FLEXERIL) 5 MG tablet Take 1 tablet (5 mg total) 3 (three) times daily as needed by mouth for muscle spasms.  Marland Kitchen dexamethasone (DECADRON) 4 MG tablet   . gabapentin (NEURONTIN) 300 MG capsule Take 1 capsule (  300 mg total) by mouth at bedtime.  Marland Kitchen HYDROcodone-acetaminophen (NORCO) 5-325 MG tablet Take 1 tablet by mouth every 6 (six) hours as needed for moderate pain.  Marland Kitchen lidocaine-prilocaine (EMLA) cream Apply to affected area once  . ondansetron (ZOFRAN) 8 MG tablet Take 1 tablet (8 mg total) 2 (two) times daily as needed by mouth. Start on the third day after cisplatin chemotherapy.  . Pegfilgrastim (NEULASTA ONPRO [place]) Inject into the skin. Every 21 days  . polyethylene glycol powder (GLYCOLAX/MIRALAX) powder Take 17 g by mouth daily.  . prochlorperazine (COMPAZINE) 10 MG tablet Take 1 tablet (10 mg total) by mouth every 6 (six) hours as needed (Nausea or vomiting).  . topotecan in sodium chloride 0.9 % 100 mL Inject into the vein once. Days 1-5 every 21 days  . traZODone (DESYREL) 100 MG tablet Take 400 mg by mouth at bedtime.   No facility-administered encounter medications on file as of 09/09/2017.     ALLERGIES:  Allergies  Allergen Reactions  . Codeine Nausea Only  . Niaspan [Niacin Er]      PHYSICAL EXAM:  ECOG Performance status: 1 - Symptomatic; remains independent   Vitals:    09/09/17 0834  BP: (!) 162/67  Pulse: 81  Resp: 16  Temp: 97.6 F (36.4 C)  SpO2: 95%   Filed Weights   09/09/17 0834  Weight: 160 lb (72.6 kg)     Physical Exam  Constitutional: He is oriented to person, place, and time.  Chronically-ill appearing male in no acute distress  HENT:  Head: Normocephalic.  Mouth/Throat: Oropharynx is clear and moist. No oropharyngeal exudate.  Eyes: Conjunctivae are normal. Pupils are equal, round, and reactive to light. No scleral icterus.  Neck: Normal range of motion. Neck supple.  Cardiovascular: Normal rate and regular rhythm.  Pulmonary/Chest: Effort normal. No respiratory distress.  Subtle rhonchi noted throughout  Abdominal: Soft. Bowel sounds are normal. There is no tenderness.  Musculoskeletal: Normal range of motion. He exhibits no edema.  Lymphadenopathy:    He has no cervical adenopathy.       Right: No supraclavicular adenopathy present.       Left: No supraclavicular adenopathy present.  Neurological: He is alert and oriented to person, place, and time. No cranial nerve deficit. Gait normal.  Skin: Skin is warm. No rash noted. There is pallor.  Mildly clammy/diaphoretic skin to touch (he was given anti-emetics and cool wash cloth during visit; these symptoms improved)  Psychiatric: Mood, memory, affect and judgment normal.  Nursing note and vitals reviewed.    LABORATORY DATA:  I have reviewed the labs as listed.  CBC    Component Value Date/Time   WBC 5.4 08/24/2017 1104   RBC 4.09 (L) 08/24/2017 1104   HGB 12.4 (L) 08/24/2017 1104   HCT 38.9 (L) 08/24/2017 1104   PLT 176 08/24/2017 1104   MCV 95.1 08/24/2017 1104   MCH 30.3 08/24/2017 1104   MCHC 31.9 08/24/2017 1104   RDW 15.3 08/24/2017 1104   LYMPHSABS 1.3 08/24/2017 1104   MONOABS 0.4 08/24/2017 1104   EOSABS 0.1 08/24/2017 1104   BASOSABS 0.0 08/24/2017 1104   CMP Latest Ref Rng & Units 08/24/2017 08/12/2017 05/25/2017  Glucose 65 - 99 mg/dL 158(H) 108(H)  169(H)  BUN 6 - 20 mg/dL 19 36(H) 29(H)  Creatinine 0.61 - 1.24 mg/dL 0.92 1.00 0.91  Sodium 135 - 145 mmol/L 136 132(L) 133(L)  Potassium 3.5 - 5.1 mmol/L 4.2 4.2 4.0  Chloride 101 - 111  mmol/L 105 99(L) 101  CO2 22 - 32 mmol/L 22 24 26   Calcium 8.9 - 10.3 mg/dL 8.8(L) 9.1 8.6(L)  Total Protein 6.5 - 8.1 g/dL 6.8 6.9 5.7(L)  Total Bilirubin 0.3 - 1.2 mg/dL 0.3 0.6 0.3  Alkaline Phos 38 - 126 U/L 62 55 83  AST 15 - 41 U/L 18 22 21   ALT 17 - 63 U/L 17 20 36    PENDING LABS:    DIAGNOSTIC IMAGING:  *The following radiologic images and reports have been reviewed independently and agree with below findings.  PET scan: 08/13/17 CLINICAL DATA:  Subsequent treatment strategy for extensive stage small cell lung cancer diagnosed July 2018 status post chemotherapy. Restaging with specific attention to the nonspecific bilateral lower lobe hypermetabolic pulmonary nodules on prior PET-CT.  EXAM: NUCLEAR MEDICINE PET SKULL BASE TO THIGH  TECHNIQUE: 8.5 mCi F-18 FDG was injected intravenously. Full-ring PET imaging was performed from the skull base to thigh after the radiotracer. CT data was obtained and used for attenuation correction and anatomic localization.  Mediastinal Blood Pool Activity (max SUV): 2.6  FASTING BLOOD GLUCOSE:  Value: 96 mg/dl  COMPARISON:  06/12/2017 PET-CT.  FINDINGS: NECK:  Increased partial opacification of the right maxillary sinus with associated hypermetabolism.  Hypermetabolic 0.9 cm lower right parotid nodule with max SUV 8.4, previously 1.0 cm with max SUV 10.5, stable in size and slightly decreased in metabolism.  No hypermetabolic lymph nodes in the neck. No residual hypermetabolism within the right supraclavicular neck.  CHEST:  Hypermetabolic 1.0 cm right paratracheal node with max SUV 5.9, previously 1.1 cm with max SUV 5.4, not appreciably changed in size or metabolism.  Mildly hypermetabolic right hilar node with max SUV  3.8, previous max SUV 3.4, not appreciably changed.  No new hypermetabolic mediastinal or hilar nodes. No hypermetabolic axillary nodes.  Hypermetabolic irregular solid 2.3 x 1.5 cm right lower lobe pulmonary nodule with max SUV 12.2 (series 8/image 49), decreased in size from 3.1 x 2.1 cm although increased in density and increased in metabolism from previous max SUV 7.9.  Hypermetabolic irregular solid 2.2 x 1.8 cm left lower lobe pulmonary nodule with max SUV 10.1 (series 8/image 48), decreased in size from 2.7 x 2.0 cm although increased in density and increased in metabolism from previous max SUV 7.7.  New hypermetabolic solid 1.0 cm posterior left upper lobe pulmonary nodule with max SUV 3.4 (series 8/image 42).  Right internal jugular MediPort terminates at the cavoatrial junction. Three-vessel coronary atherosclerosis. Atherosclerotic nonaneurysmal thoracic aorta. Moderate to severe centrilobular emphysema with diffuse bronchial wall thickening.  ABDOMEN/PELVIS:  There is focal hypermetabolism at the anorectal junction with max SUV 11.7, previous max SUV 9.5.  No abnormal hypermetabolic activity within the liver, pancreas, adrenal glands, or spleen. No hypermetabolic lymph nodes in the abdomen or pelvis. Simple 1.5 cm medial upper left renal cyst. Atherosclerotic nonaneurysmal abdominal aorta. Mild sigmoid diverticulosis. Moderate colorectal stool volume.  SKELETON: No focal hypermetabolic activity to suggest skeletal metastasis.  IMPRESSION: 1. Irregular solid hypermetabolic pulmonary nodules in the bilateral lower lobes have increased in density and metabolism. New hypermetabolic solid left upper lobe pulmonary nodule. Unfortunately, these findings are most compatible with progressive pulmonary metastases. 2. Stable hypermetabolic right hilar and right paratracheal mediastinal adenopathy. 3. Stable hypermetabolic small lower right superficial  parotid nodule, most compatible with primary parotid neoplasm. 4. Nonspecific focal hypermetabolism at the anorectal junction, increased. Although potentially physiologic, correlation with clinical evaluation is recommended to exclude neoplasm in this location.  5. Chronic findings include: Aortic Atherosclerosis (ICD10-I70.0) and Emphysema (ICD10-J43.9). Three-vessel coronary atherosclerosis. Chronic right maxillary sinusitis. Mild sigmoid diverticulosis.   Electronically Signed   By: Ilona Sorrel M.D.   On: 08/13/2017 10:45       MRI brain: 05/28/17 CLINICAL DATA:  67 year old male restaging of advanced stage small cell lung cancer.  EXAM: MRI HEAD WITHOUT AND WITH CONTRAST  TECHNIQUE: Multiplanar, multiecho pulse sequences of the brain and surrounding structures were obtained without and with intravenous contrast.  CONTRAST:  42mL MULTIHANCE GADOBENATE DIMEGLUMINE 529 MG/ML IV SOLN  COMPARISON:  Brain MRI 02/11/2017  FINDINGS: Brain: No abnormal enhancement identified. No midline shift, mass effect, or evidence of intracranial mass lesion. No dural thickening identified.  Stable cerebral volume. No restricted diffusion to suggest acute infarction. No ventriculomegaly, extra-axial collection or acute intracranial hemorrhage. Cervicomedullary junction and pituitary are within normal limits.  Scattered small nonspecific bilateral white matter T2 and FLAIR hyperintense foci have mildly progressed since August, including an area in the right superior frontal gyrus series 10, image 38, but these are nonenhancing (T1 hypointense following contrast) and with no associated mass effect. The extent is now moderate. No cortical encephalomalacia identified. No chronic cerebral blood products. Deep gray matter nuclei, brainstem and cerebellum remain normal.  Vascular: Major intracranial vascular flow voids are stable.  Skull and upper cervical spine: Visible bone  marrow signal is normal. Negative visualized cervical spine and spinal cord.  Sinuses/Orbits: Normal orbits soft tissues. New fluid level in the right maxillary sinus with mucosal thickening. Other paranasal sinuses are stable.  Other: Clustered benign appearing retention cysts in the nasopharynx both in the midline and near the left torus tubarius (series 9, image 3 and series 11, image 16). Trace bilateral mastoid fluid has increased. Visible internal auditory structures appear normal. Scalp and face soft tissues appear negative.  IMPRESSION: 1. No metastatic disease identified. 2. Increased nonspecific cerebral white matter signal changes since August. These are most commonly small vessel disease related. 3. New right maxillary sinusitis. Benign appearing retention cysts in the nasopharynx with trace mastoid effusions.   Electronically Signed   By: Genevie Ann M.D.   On: 05/28/2017 14:39   PATHOLOGY:  (R) neck lymph node biopsy: 02/03/17            ASSESSMENT & PLAN:   Extensive-stage small cell lung cancer with liver mets:  -Initially presented to his PCP in early 01/2017 with 3-week history of right neck mass.  CT chest performed on 01/16/17 revealed extensive lymphadenopathy in chest and lower right cervical lymph node area; also indeterminate lesion in liver.  CT neck also performed on 01/16/17 also showed bulky (R) supraclavicular malignant lymph node measuring 5.4 cm, with several abnormal level 3 & 4 cervical lymph nodes and lymphadenopathy to superior mediastinum. IR attempted to place port-a-cath for anticipated chemotherapy; they were unable to place port d/t enlarged SVC and aborted procedure d/t concerns for patient safety.  Biopsy of (R) neck mass revealed small cell carcinoma, most consistent with a lung primary.  Initial PET scan revealed hypermetabolic liver lesion, as well as multiple sites of hypermetabolic lymphadenopathy. He understands that his cancer is  treatable, but not curable. PET scan from 08/13/17 revealed progression of disease. Treatment was then changed to single-agent Topotecan on days 1-5, beginning on 08/24/17.   -s/p cycle #1 single-agent Topotecan with GCSF support.  Tolerated relatively well with biggest complaints being worsening arthralgias, N&V, and dizziness.  Labs reviewed. Mild thrombocytopenia and anemia noted.  No evidence of obvious dehydration on lab work. VS are stable.  He did have episode of nausea with flushed feeling in the office today. He was given STAT dose of Zofran 8 mg ODT during visit.  His nausea resolved without emesis. His flushed sensation also resolved.  He left clinic feeling much better and ambulatory.  Encouraged him to force non-caffeinated/non-alcoholic fluids as tolerated.  -Return to cancer center as scheduled for consideration for cycle #2 Topotecan.  -Will likely obtain restaging imaging after 3-4 cycles of therapy.    Dizziness:  -Likely multifactorial and has been chronic. Symptoms worse with standing/position changes. Not likely d/t disease given MRI brain 05/28/17 negative for metastatic disease and he has completed whole brain/prophylactic cranial irradiation.  -Could be secondary to orthostatic hypotension. Encouraged him to force fluids as tolerated to see if his symptoms improve.     Arthralgias to back, hips, & shoulders:  -Likely multifactorial. There is evidence that chemotherapy can worsen previous existing arthritis pain. Norco is reportedly effective in controlling his pain; generally requires 1-2 tabs/day.   -[place] Controlled Substance Reporting System reviewed.  Refill appropriate; will increase his supply to #75 given his recent increase in pain.  -Bone pain may also be worse after OnPro Neulasta. Explained mechanism of action and purpose of Neulasta. Recommended he start Claritin for 7 days after chemotherapy to see if his bone pain symptoms improve. He agreed to give this a try.      Constipation/Diarrhea:  -Likely multifactorial given opiates and decreased oral intake.   -Continue Miralax and increase fluid consumption as tolerated.    Decreased oral intake:  -Weight loss of ~6 lbs noted in past 3 weeks. Encouraged him to continue to supplement his diet with Boost/Ensure as needed. Recommended he consider pre-medicating with anti-emetics before meals to see if this helps his appetite.       Dispo:  -Return to cancer center as scheduled for follow-up and consideration for next cycle of chemotherapy.    All questions were answered to patient's stated satisfaction. Encouraged patient to call with any new concerns or questions before his next visit to the cancer center and we can certain see him sooner, if needed.         Orders placed this encounter:  No orders of the defined types were placed in this encounter.     [name], NP [place] [phone]

## 2017-09-09 ENCOUNTER — Inpatient Hospital Stay (HOSPITAL_COMMUNITY): Payer: Medicare PPO

## 2017-09-09 ENCOUNTER — Inpatient Hospital Stay (HOSPITAL_BASED_OUTPATIENT_CLINIC_OR_DEPARTMENT_OTHER): Payer: Medicare PPO | Admitting: Adult Health

## 2017-09-09 ENCOUNTER — Encounter (HOSPITAL_COMMUNITY): Payer: Self-pay | Admitting: Adult Health

## 2017-09-09 ENCOUNTER — Other Ambulatory Visit: Payer: Self-pay

## 2017-09-09 VITALS — BP 162/67 | HR 81 | Temp 97.6°F | Resp 16 | Ht 69.0 in | Wt 160.0 lb

## 2017-09-09 DIAGNOSIS — M25512 Pain in left shoulder: Secondary | ICD-10-CM

## 2017-09-09 DIAGNOSIS — M25511 Pain in right shoulder: Secondary | ICD-10-CM

## 2017-09-09 DIAGNOSIS — R42 Dizziness and giddiness: Secondary | ICD-10-CM | POA: Diagnosis not present

## 2017-09-09 DIAGNOSIS — R112 Nausea with vomiting, unspecified: Secondary | ICD-10-CM

## 2017-09-09 DIAGNOSIS — R918 Other nonspecific abnormal finding of lung field: Secondary | ICD-10-CM | POA: Diagnosis not present

## 2017-09-09 DIAGNOSIS — M25551 Pain in right hip: Secondary | ICD-10-CM | POA: Diagnosis not present

## 2017-09-09 DIAGNOSIS — C770 Secondary and unspecified malignant neoplasm of lymph nodes of head, face and neck: Secondary | ICD-10-CM | POA: Diagnosis not present

## 2017-09-09 DIAGNOSIS — R197 Diarrhea, unspecified: Secondary | ICD-10-CM | POA: Diagnosis not present

## 2017-09-09 DIAGNOSIS — R634 Abnormal weight loss: Secondary | ICD-10-CM | POA: Diagnosis not present

## 2017-09-09 DIAGNOSIS — M549 Dorsalgia, unspecified: Secondary | ICD-10-CM

## 2017-09-09 DIAGNOSIS — F17210 Nicotine dependence, cigarettes, uncomplicated: Secondary | ICD-10-CM | POA: Diagnosis not present

## 2017-09-09 DIAGNOSIS — G629 Polyneuropathy, unspecified: Secondary | ICD-10-CM | POA: Diagnosis not present

## 2017-09-09 DIAGNOSIS — C787 Secondary malignant neoplasm of liver and intrahepatic bile duct: Secondary | ICD-10-CM

## 2017-09-09 DIAGNOSIS — C3490 Malignant neoplasm of unspecified part of unspecified bronchus or lung: Secondary | ICD-10-CM

## 2017-09-09 DIAGNOSIS — Z5111 Encounter for antineoplastic chemotherapy: Secondary | ICD-10-CM | POA: Diagnosis not present

## 2017-09-09 DIAGNOSIS — M25552 Pain in left hip: Secondary | ICD-10-CM | POA: Diagnosis not present

## 2017-09-09 DIAGNOSIS — K5900 Constipation, unspecified: Secondary | ICD-10-CM

## 2017-09-09 DIAGNOSIS — G893 Neoplasm related pain (acute) (chronic): Secondary | ICD-10-CM

## 2017-09-09 LAB — CBC WITH DIFFERENTIAL/PLATELET
Basophils Absolute: 0 10*3/uL (ref 0.0–0.1)
Basophils Relative: 0 %
Eosinophils Absolute: 0 10*3/uL (ref 0.0–0.7)
Eosinophils Relative: 1 %
HCT: 36.2 % — ABNORMAL LOW (ref 39.0–52.0)
Hemoglobin: 11.8 g/dL — ABNORMAL LOW (ref 13.0–17.0)
Lymphocytes Relative: 20 %
Lymphs Abs: 1.5 10*3/uL (ref 0.7–4.0)
MCH: 29.7 pg (ref 26.0–34.0)
MCHC: 32.6 g/dL (ref 30.0–36.0)
MCV: 91.2 fL (ref 78.0–100.0)
Monocytes Absolute: 0.8 10*3/uL (ref 0.1–1.0)
Monocytes Relative: 12 %
Neutro Abs: 4.8 10*3/uL (ref 1.7–7.7)
Neutrophils Relative %: 67 %
Platelets: 81 10*3/uL — ABNORMAL LOW (ref 150–400)
RBC: 3.97 MIL/uL — ABNORMAL LOW (ref 4.22–5.81)
RDW: 15.4 % (ref 11.5–15.5)
WBC: 7.1 10*3/uL (ref 4.0–10.5)

## 2017-09-09 LAB — COMPREHENSIVE METABOLIC PANEL
ALT: 18 U/L (ref 17–63)
AST: 25 U/L (ref 15–41)
Albumin: 3.9 g/dL (ref 3.5–5.0)
Alkaline Phosphatase: 78 U/L (ref 38–126)
Anion gap: 10 (ref 5–15)
BUN: 14 mg/dL (ref 6–20)
CO2: 22 mmol/L (ref 22–32)
Calcium: 9 mg/dL (ref 8.9–10.3)
Chloride: 104 mmol/L (ref 101–111)
Creatinine, Ser: 0.9 mg/dL (ref 0.61–1.24)
GFR calc Af Amer: >60 mL/min (ref >60)
GFR calc non Af Amer: >60 mL/min (ref >60)
Glucose, Bld: 108 mg/dL — ABNORMAL HIGH (ref 65–99)
Potassium: 3.8 mmol/L (ref 3.5–5.1)
Sodium: 136 mmol/L (ref 135–145)
Total Bilirubin: 0.5 mg/dL (ref 0.3–1.2)
Total Protein: 7 g/dL (ref 6.5–8.1)

## 2017-09-09 LAB — LACTATE DEHYDROGENASE: LDH: 180 U/L (ref 98–192)

## 2017-09-09 MED ORDER — HYDROCODONE-ACETAMINOPHEN 5-325 MG PO TABS: 1.0000 | ORAL_TABLET | Freq: Four times a day (QID) | ORAL | 0 refills | Status: DC | PRN

## 2017-09-09 MED ORDER — ONDANSETRON 8 MG PO TBDP
8.0000 mg | ORAL_TABLET | ORAL | Status: AC
  Administered 2017-09-09: 8 mg via ORAL

## 2017-09-09 MED ORDER — ONDANSETRON 8 MG PO TBDP
ORAL_TABLET | ORAL | Status: AC
  Filled 2017-09-09: qty 1

## 2017-09-14 ENCOUNTER — Encounter (HOSPITAL_COMMUNITY): Payer: Self-pay | Admitting: Internal Medicine

## 2017-09-14 ENCOUNTER — Other Ambulatory Visit: Payer: Self-pay

## 2017-09-14 ENCOUNTER — Inpatient Hospital Stay (HOSPITAL_BASED_OUTPATIENT_CLINIC_OR_DEPARTMENT_OTHER): Payer: Medicare PPO | Admitting: Internal Medicine

## 2017-09-14 ENCOUNTER — Inpatient Hospital Stay (HOSPITAL_COMMUNITY): Payer: Medicare PPO | Attending: Oncology

## 2017-09-14 VITALS — BP 149/67 | HR 70 | Temp 97.7°F | Resp 18 | Wt 167.8 lb

## 2017-09-14 DIAGNOSIS — C3490 Malignant neoplasm of unspecified part of unspecified bronchus or lung: Secondary | ICD-10-CM | POA: Diagnosis not present

## 2017-09-14 DIAGNOSIS — C770 Secondary and unspecified malignant neoplasm of lymph nodes of head, face and neck: Secondary | ICD-10-CM | POA: Diagnosis not present

## 2017-09-14 DIAGNOSIS — T451X5A Adverse effect of antineoplastic and immunosuppressive drugs, initial encounter: Secondary | ICD-10-CM

## 2017-09-14 DIAGNOSIS — C7800 Secondary malignant neoplasm of unspecified lung: Secondary | ICD-10-CM

## 2017-09-14 DIAGNOSIS — G620 Drug-induced polyneuropathy: Secondary | ICD-10-CM

## 2017-09-14 DIAGNOSIS — R630 Anorexia: Secondary | ICD-10-CM | POA: Diagnosis not present

## 2017-09-14 DIAGNOSIS — Z5111 Encounter for antineoplastic chemotherapy: Secondary | ICD-10-CM | POA: Insufficient documentation

## 2017-09-14 DIAGNOSIS — F17210 Nicotine dependence, cigarettes, uncomplicated: Secondary | ICD-10-CM

## 2017-09-14 DIAGNOSIS — C787 Secondary malignant neoplasm of liver and intrahepatic bile duct: Secondary | ICD-10-CM | POA: Diagnosis not present

## 2017-09-14 LAB — COMPREHENSIVE METABOLIC PANEL
ALT: 13 U/L — ABNORMAL LOW (ref 17–63)
AST: 20 U/L (ref 15–41)
Albumin: 3.5 g/dL (ref 3.5–5.0)
Alkaline Phosphatase: 65 U/L (ref 38–126)
Anion gap: 8 (ref 5–15)
BUN: 13 mg/dL (ref 6–20)
CO2: 24 mmol/L (ref 22–32)
Calcium: 8.8 mg/dL — ABNORMAL LOW (ref 8.9–10.3)
Chloride: 105 mmol/L (ref 101–111)
Creatinine, Ser: 0.84 mg/dL (ref 0.61–1.24)
GFR calc Af Amer: >60 mL/min (ref >60)
GFR calc non Af Amer: >60 mL/min (ref >60)
Glucose, Bld: 127 mg/dL — ABNORMAL HIGH (ref 65–99)
Potassium: 4.1 mmol/L (ref 3.5–5.1)
Sodium: 137 mmol/L (ref 135–145)
Total Bilirubin: 0.2 mg/dL — ABNORMAL LOW (ref 0.3–1.2)
Total Protein: 6 g/dL — ABNORMAL LOW (ref 6.5–8.1)

## 2017-09-14 LAB — CBC WITH DIFFERENTIAL/PLATELET
Basophils Absolute: 0 10*3/uL (ref 0.0–0.1)
Basophils Relative: 0 %
Eosinophils Absolute: 0.1 10*3/uL (ref 0.0–0.7)
Eosinophils Relative: 1 %
HCT: 33.6 % — ABNORMAL LOW (ref 39.0–52.0)
Hemoglobin: 10.9 g/dL — ABNORMAL LOW (ref 13.0–17.0)
Lymphocytes Relative: 17 %
Lymphs Abs: 1.2 10*3/uL (ref 0.7–4.0)
MCH: 30.1 pg (ref 26.0–34.0)
MCHC: 32.4 g/dL (ref 30.0–36.0)
MCV: 92.8 fL (ref 78.0–100.0)
Monocytes Absolute: 0.7 10*3/uL (ref 0.1–1.0)
Monocytes Relative: 10 %
Neutro Abs: 5.3 10*3/uL (ref 1.7–7.7)
Neutrophils Relative %: 72 %
Platelets: 293 10*3/uL (ref 150–400)
RBC: 3.62 MIL/uL — ABNORMAL LOW (ref 4.22–5.81)
RDW: 16.4 % — ABNORMAL HIGH (ref 11.5–15.5)
WBC: 7.3 10*3/uL (ref 4.0–10.5)

## 2017-09-14 LAB — LACTATE DEHYDROGENASE: LDH: 148 U/L (ref 98–192)

## 2017-09-14 MED ORDER — PROCHLORPERAZINE MALEATE 10 MG PO TABS
ORAL_TABLET | ORAL | Status: AC
  Filled 2017-09-14: qty 1

## 2017-09-14 MED ORDER — SODIUM CHLORIDE 0.9 % IV SOLN
Freq: Once | INTRAVENOUS | Status: AC
  Administered 2017-09-14: 13:00:00 via INTRAVENOUS

## 2017-09-14 MED ORDER — HEPARIN SOD (PORK) LOCK FLUSH 100 UNIT/ML IV SOLN
500.0000 [IU] | Freq: Once | INTRAVENOUS | Status: AC | PRN
  Administered 2017-09-14: 500 [IU]
  Filled 2017-09-14: qty 5

## 2017-09-14 MED ORDER — SODIUM CHLORIDE 0.9% FLUSH
10.0000 mL | INTRAVENOUS | Status: DC | PRN
  Administered 2017-09-14: 10 mL
  Filled 2017-09-14: qty 10

## 2017-09-14 MED ORDER — TOPOTECAN HCL CHEMO INJECTION 4 MG
1.5000 mg/m2 | Freq: Once | INTRAVENOUS | Status: AC
  Administered 2017-09-14: 2.9 mg via INTRAVENOUS
  Filled 2017-09-14: qty 2.9

## 2017-09-14 MED ORDER — PROCHLORPERAZINE MALEATE 10 MG PO TABS
10.0000 mg | ORAL_TABLET | Freq: Once | ORAL | Status: AC
  Administered 2017-09-14: 10 mg via ORAL

## 2017-09-14 NOTE — Progress Notes (Signed)
28 Labs reviewed with and pt seen by Dr. Walden Field and approved for tx today.                                              [name] tolerated Topotecan infusion well without complaints or incident. VSS upon discharge. Port left accessed,saline locked and flushed per protocol for use tomorrow. Pt discharged self ambulatory in satisfactory condition

## 2017-09-14 NOTE — Patient Instructions (Addendum)
[place] at [facility] Discharge Instructions   You were seen today by Dr. Benancio [facility] at [facility] Discharge Instructions   You were seen today by Dr. Zoila Shutter   Thank you for choosing [place] at [facility] to provide your oncology and hematology care.  To afford each patient quality time with our provider, please arrive at least 15 minutes before your scheduled appointment time.    If you have a lab appointment with the [place] please come in thru the  Main Entrance and check in at the main information desk  You need to re-schedule your appointment should you arrive 10 or more minutes late.  We strive to give you quality time with our providers, and arriving late affects you and other patients whose appointments are after yours.  Also, if you no show three or more times for appointments you may be dismissed from the clinic at the providers discretion.     Again, thank you for choosing [facility].  Our hope is that these requests will decrease the amount of time that you wait before being seen by our physicians.       _____________________________________________________________  Should you have questions after your visit to [facility], please contact our office at [phone] between the hours of 8:30 a.m. and 4:30 p.m.  Voicemails left after 4:30 p.m. will not be returned until the following business day.  For prescription refill requests, have your pharmacy contact our office.       Resources For Cancer Patients and their Caregivers ? American Cancer Society: Can assist with transportation, wigs, general needs, runs Look Good Feel Better.        863-823-1829 ? Cancer Care: Provides financial assistance, online support groups, medication/co-pay assistance.  1-800-813-HOPE 781 187 8181) ? [place] Assists [place] cancer patients  and their families through emotional , educational and financial support.  4083389488 ? [place] DSS Where to apply for food stamps, Medicaid and utility assistance. (854) 662-3129 ? RCATS: Transportation to medical appointments. 442-743-3882 ? Social Security Administration: May apply for disability if have a Stage IV cancer. 320-336-3847 607-013-2213 ? [employer], Disability and Transit Services: Assists with nutrition, care and transit needs. [place] Support Programs:   > Cancer Support Group  2nd Tuesday of the month 1pm-2pm, Journey Room   > Creative Journey  3rd Tuesday of the month 1130am-1pm, Journey Room      Thank you for choosing [place] at [facility] to provide your oncology and hematology care.  To afford each patient quality time with our provider, please arrive at least 15 minutes before your scheduled appointment time.    If you have a lab appointment with the [facility] please come in thru the  Main Entrance and check in at the main information desk  You need to re-schedule your appointment should you arrive 10 or more minutes late.  We strive to give you quality time with our providers, and arriving late affects you and other patients whose appointments are after yours.  Also, if you no show three or more times for appointments you may be dismissed from the clinic at the providers discretion.     Again, thank you for choosing [facility].  Our hope is that these requests will decrease the amount of time that you wait before being seen  by our physicians.       _____________________________________________________________  Should you have questions after your visit to [facility], please contact our office at [phone] between the hours of 8:30 a.m. and 4:30 p.m.  Voicemails left after 4:30 p.m. will not be returned until the following business day.  For prescription refill  requests, have your pharmacy contact our office.       Resources For Cancer Patients and their Caregivers ? American Cancer Society: Can assist with transportation, wigs, general needs, runs Look Good Feel Better.        (770)290-4866 ? Cancer Care: Provides financial assistance, online support groups, medication/co-pay assistance.  1-800-813-HOPE 773-487-2590) ? [place] Assists [place] cancer patients and their families through emotional , educational and financial support.  (857)504-9626 ? [place] DSS Where to apply for food stamps, Medicaid and utility assistance. 613-327-1866 ? RCATS: Transportation to medical appointments. (540)126-7289 ? Social Security Administration: May apply for disability if have a Stage IV cancer. 507-208-2091 (661) 026-2774 ? [employer], Disability and Transit Services: Assists with nutrition, care and transit needs. [place] Support Programs:   > Cancer Support Group  2nd Tuesday of the month 1pm-2pm, Journey Room   > Creative Journey  3rd Tuesday of the month 1130am-1pm, Journey Room

## 2017-09-14 NOTE — Patient Instructions (Addendum)
[name] at [facility] Discharge Instructions  Received Topotecan infusion today You were seen today by Dr. Zoila Shutter We are waiting on your lab work to come back. If it is okay you will proceed with treatment this week We will have you follow up in 3 weeks with your next treatment and you will see our new physician Dr. Delton Coombes   Thank you for choosing [place] at [facility] to provide your oncology and hematology care.  To afford each patient quality time with our provider, please arrive at least 15 minutes before your scheduled appointment time.    If you have a lab appointment with the [place] please come in thru the  Main Entrance and check in at the main information desk  You need to re-schedule your appointment should you arrive 10 or more minutes late.  We strive to give you quality time with our providers, and arriving late affects you and other patients whose appointments are after yours.  Also, if you no show three or more times for appointments you may be dismissed from the clinic at the providers discretion.     Again, thank you for choosing [facility].  Our hope is that these requests will decrease the amount of time that you wait before being seen by our physicians.       _____________________________________________________________  Should you have questions after your visit to [facility], please contact our office at [phone] between the hours of 8:30 a.m. and 4:30 p.m.  Voicemails left after 4:30 p.m. will not be returned until the following business day.  For prescription refill requests, have your pharmacy contact our office.       Resources For Cancer Patients and their Caregivers ? American Cancer Society: Can assist with transportation, wigs, general needs, runs Look Good Feel Better.        506-363-6326 ? Cancer Care: Provides financial assistance, online support  groups, medication/co-pay assistance.  1-800-813-HOPE 4045540775) ? [place] Assists [place] cancer patients and their families through emotional , educational and financial support.  919-158-0415 ? [place] DSS Where to apply for food stamps, Medicaid and utility assistance. 272-267-9919 ? RC[employer]: Transportation to medical appointments. 850-182-2266 ? Social Security Administration: May apply for disability if have a Stage IV cancer. 254-128-9399 629-822-6612 ? [employer], Disability and Transit Services: Assists with nutrition, care and transit needs. [place] Support Programs:   > Cancer Support Group  2nd Tuesday of the month 1pm-2pm, Journey Room   > Creative Journey  3rd Tuesday of the month 1130am-1pm, Journey Room

## 2017-09-15 ENCOUNTER — Inpatient Hospital Stay (HOSPITAL_COMMUNITY): Payer: Medicare PPO

## 2017-09-15 ENCOUNTER — Inpatient Hospital Stay (HOSPITAL_COMMUNITY): Payer: Medicare PPO | Admitting: Dietician

## 2017-09-15 VITALS — BP 144/61 | HR 70 | Temp 97.7°F | Resp 18

## 2017-09-15 DIAGNOSIS — Z5111 Encounter for antineoplastic chemotherapy: Secondary | ICD-10-CM | POA: Diagnosis not present

## 2017-09-15 DIAGNOSIS — C3490 Malignant neoplasm of unspecified part of unspecified bronchus or lung: Secondary | ICD-10-CM | POA: Diagnosis not present

## 2017-09-15 DIAGNOSIS — C770 Secondary and unspecified malignant neoplasm of lymph nodes of head, face and neck: Secondary | ICD-10-CM | POA: Diagnosis not present

## 2017-09-15 DIAGNOSIS — C787 Secondary malignant neoplasm of liver and intrahepatic bile duct: Secondary | ICD-10-CM | POA: Diagnosis not present

## 2017-09-15 DIAGNOSIS — C7800 Secondary malignant neoplasm of unspecified lung: Secondary | ICD-10-CM | POA: Diagnosis not present

## 2017-09-15 MED ORDER — PROCHLORPERAZINE MALEATE 10 MG PO TABS
ORAL_TABLET | ORAL | Status: AC
  Filled 2017-09-15: qty 1

## 2017-09-15 MED ORDER — PROCHLORPERAZINE MALEATE 10 MG PO TABS
10.0000 mg | ORAL_TABLET | Freq: Once | ORAL | Status: AC
  Administered 2017-09-15: 10 mg via ORAL

## 2017-09-15 MED ORDER — SODIUM CHLORIDE 0.9 % IV SOLN
Freq: Once | INTRAVENOUS | Status: AC
  Administered 2017-09-15: 12:00:00 via INTRAVENOUS

## 2017-09-15 MED ORDER — TOPOTECAN HCL CHEMO INJECTION 4 MG
1.5000 mg/m2 | Freq: Once | INTRAVENOUS | Status: AC
  Administered 2017-09-15: 2.9 mg via INTRAVENOUS
  Filled 2017-09-15: qty 2.9

## 2017-09-15 MED ORDER — SODIUM CHLORIDE 0.9% FLUSH
10.0000 mL | INTRAVENOUS | Status: DC | PRN
  Administered 2017-09-15: 10 mL
  Filled 2017-09-15: qty 10

## 2017-09-15 NOTE — Progress Notes (Signed)
Tolerated infusion w/o adverse reaction.  Alert, in no distress.  VSS.  Discharged ambulatory.  

## 2017-09-15 NOTE — Progress Notes (Signed)
Nutrition Assessment  Reason for Assessment: MST  ASSESSMENT: 67 y/o male PMHx Tobacco abuse, anxiety, Depression, CAD, COPD, MI and extensive stage SCLC undergoing palliative chemotherapy. Recent Pet 1/31 showed disease progression and tx changed to single agent topotecan on 2/11. He is s/p cycle 1.   Patient seen during infusion. He says his number one issue is pain in his back and hands. 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   RD asked about his nausea and if he had been premedicating before meals as Onc NP disscussed with him at last visit. He DENIES nausea. He says there was a misunderstanding and he "has a different definition of nausea". He defines nausea as "just a slightly upset stomach". He has not been taking the antiemetics before meals "because I dont have nausea".   He has chronic constipation. He has been pushing fluids to try to improve this. Notes fatigue is improbed  Overall, he feels he is tolerating this chemotherapy agent well.   RD asked about his apparent weight loss over the past few months. He says prior to tx his UBW was 155-165. He gained up to 178 lbs during tx "due to a misunderstanding". He says his weight gain was due to taking steroids as he thought he was supposed to. In reality, he was only supposed to take them a couple times a week. He reports this caused him to swell and overeat. Now that this has been corrected, he is returning to his UBW.   He says his appetite is also fair-good, "even without the steroids"  He drinks 2 Boost supplements/day  He notes he has been told to avoid high fat foods due to his cholesterol.   Nutrition Focused Physical Exam: Mild-mod upper body wasting. Unable to assess lower body.   Medications: IV topotecan, Supportive meds for: pain, constipation, nausea. Decadron. Xanax.   Labs: Glu:127, H/H10.9/33.6  Recent Labs  Lab 09/09/17 0756 09/14/17 1131  NA 136 137  K 3.8 4.1  CL 104 105  CO2 22 24  BUN  14 13  CREATININE 0.90 0.84  CALCIUM 9.0 8.8*  GLUCOSE 108* 127*   Anthropometrics:  Height: 5\' 9"  Weight: 167 lbs 13 oz (76.27 kg) UBW: 155-165 BMI: 24.77  Estimated Energy Needs Kcals: 2150-2350 (28-31 kcal/kg bw) Protein: 92-107g Pro (1.2-1.4 g/kg bw) Fluid: 2.3 Liters (30 ml/kg bw)  NUTRITION DIAGNOSIS: Increased protein/kcal needs related to cancer and cancer related tx as evidenced by the recommended nutritional requirements for this condition/therapy  MALNUTRITION DIAGNOSIS: N/A. At risk  INTERVENTION: RD dispelled his belief that he needs to be avoiding high fat foods. Advocated for a liberal diet and reviewed importance of weight maintenance during therapy.   Though his appetite sounds to be at baseline, RD reviewed calorie/protein dense foods. Recommended "Big Three" of cheese, Peanut butter and eggs which are some of the most common foods that are high in both kcals and protein. He says cheese constipates him. He had been eating egg whites. Again, advocated for yolk consumption. Beverage wise, recommended whole milk. He is drinking skim milk.   RD provided handouts titled "Increasing Calories and Protein" and Coupons for Boost/Ensure.   Patient did not vocalize any concerns or questions. In fact, patient was minimally interactive during entire conversation. Flat affect.   MONITORING, EVALUATION, GOAL: Weight trends, reported intake/diet tolerance, side effects from chemo  NEXT VISIT: As needed  Burtis Junes RD, LDN, [place] Nutrition Pager: [phone] 09/15/2017 12:26 PM

## 2017-09-16 ENCOUNTER — Other Ambulatory Visit: Payer: Self-pay | Admitting: Family Medicine

## 2017-09-16 ENCOUNTER — Inpatient Hospital Stay (HOSPITAL_COMMUNITY): Payer: Medicare PPO

## 2017-09-16 VITALS — BP 111/68 | HR 73 | Temp 97.9°F | Resp 18 | Wt 162.3 lb

## 2017-09-16 DIAGNOSIS — C3490 Malignant neoplasm of unspecified part of unspecified bronchus or lung: Secondary | ICD-10-CM | POA: Diagnosis not present

## 2017-09-16 DIAGNOSIS — C770 Secondary and unspecified malignant neoplasm of lymph nodes of head, face and neck: Secondary | ICD-10-CM | POA: Diagnosis not present

## 2017-09-16 DIAGNOSIS — C7800 Secondary malignant neoplasm of unspecified lung: Secondary | ICD-10-CM | POA: Diagnosis not present

## 2017-09-16 DIAGNOSIS — C787 Secondary malignant neoplasm of liver and intrahepatic bile duct: Secondary | ICD-10-CM | POA: Diagnosis not present

## 2017-09-16 DIAGNOSIS — Z5111 Encounter for antineoplastic chemotherapy: Secondary | ICD-10-CM | POA: Diagnosis not present

## 2017-09-16 MED ORDER — PROCHLORPERAZINE MALEATE 10 MG PO TABS
10.0000 mg | ORAL_TABLET | Freq: Once | ORAL | Status: AC
  Administered 2017-09-16: 10 mg via ORAL

## 2017-09-16 MED ORDER — SODIUM CHLORIDE 0.9 % IV SOLN
1.5000 mg/m2 | Freq: Once | INTRAVENOUS | Status: AC
  Administered 2017-09-16: 2.9 mg via INTRAVENOUS
  Filled 2017-09-16: qty 2.9

## 2017-09-16 MED ORDER — SODIUM CHLORIDE 0.9 % IV SOLN
Freq: Once | INTRAVENOUS | Status: AC
  Administered 2017-09-16: 12:00:00 via INTRAVENOUS

## 2017-09-16 MED ORDER — PROCHLORPERAZINE MALEATE 10 MG PO TABS
ORAL_TABLET | ORAL | Status: AC
  Filled 2017-09-16: qty 1

## 2017-09-16 NOTE — Telephone Encounter (Signed)
Requesting refill      LOV: 05/19/17 LRF:  05/19/17

## 2017-09-16 NOTE — Progress Notes (Signed)
Tolerated infusion w/o adverse reaction.  Alert, in no distress.  VSS.  Discharged ambulatory.  

## 2017-09-17 ENCOUNTER — Inpatient Hospital Stay (HOSPITAL_COMMUNITY): Payer: Medicare PPO

## 2017-09-17 VITALS — BP 113/48 | HR 75 | Temp 97.7°F | Resp 18 | Wt 164.6 lb

## 2017-09-17 DIAGNOSIS — C3490 Malignant neoplasm of unspecified part of unspecified bronchus or lung: Secondary | ICD-10-CM

## 2017-09-17 DIAGNOSIS — C770 Secondary and unspecified malignant neoplasm of lymph nodes of head, face and neck: Secondary | ICD-10-CM | POA: Diagnosis not present

## 2017-09-17 DIAGNOSIS — C7800 Secondary malignant neoplasm of unspecified lung: Secondary | ICD-10-CM | POA: Diagnosis not present

## 2017-09-17 DIAGNOSIS — Z5111 Encounter for antineoplastic chemotherapy: Secondary | ICD-10-CM | POA: Diagnosis not present

## 2017-09-17 DIAGNOSIS — C787 Secondary malignant neoplasm of liver and intrahepatic bile duct: Secondary | ICD-10-CM | POA: Diagnosis not present

## 2017-09-17 MED ORDER — SODIUM CHLORIDE 0.9 % IV SOLN
Freq: Once | INTRAVENOUS | Status: AC
  Administered 2017-09-17: 12:00:00 via INTRAVENOUS

## 2017-09-17 MED ORDER — PROCHLORPERAZINE MALEATE 10 MG PO TABS
ORAL_TABLET | ORAL | Status: AC
  Filled 2017-09-17: qty 1

## 2017-09-17 MED ORDER — PROCHLORPERAZINE MALEATE 10 MG PO TABS
10.0000 mg | ORAL_TABLET | Freq: Once | ORAL | Status: AC
  Administered 2017-09-17: 10 mg via ORAL

## 2017-09-17 MED ORDER — TOPOTECAN HCL CHEMO INJECTION 4 MG
1.5000 mg/m2 | Freq: Once | INTRAVENOUS | Status: AC
  Administered 2017-09-17: 2.9 mg via INTRAVENOUS
  Filled 2017-09-17: qty 2.9

## 2017-09-17 MED ORDER — SODIUM CHLORIDE 0.9% FLUSH
10.0000 mL | INTRAVENOUS | Status: DC | PRN
  Administered 2017-09-17: 10 mL
  Filled 2017-09-17: qty 10

## 2017-09-17 NOTE — Progress Notes (Signed)
Tolerated infusion w/o adverse reaction.  Alert, in no distress.  VSS.  Discharged ambulatory.  

## 2017-09-18 ENCOUNTER — Encounter (HOSPITAL_COMMUNITY): Payer: Self-pay

## 2017-09-18 ENCOUNTER — Inpatient Hospital Stay (HOSPITAL_COMMUNITY): Payer: Medicare PPO

## 2017-09-18 ENCOUNTER — Other Ambulatory Visit: Payer: Self-pay | Admitting: Family Medicine

## 2017-09-18 VITALS — BP 113/52 | HR 73 | Temp 97.7°F | Resp 18 | Wt 164.4 lb

## 2017-09-18 DIAGNOSIS — Z5111 Encounter for antineoplastic chemotherapy: Secondary | ICD-10-CM | POA: Diagnosis not present

## 2017-09-18 DIAGNOSIS — C787 Secondary malignant neoplasm of liver and intrahepatic bile duct: Secondary | ICD-10-CM | POA: Diagnosis not present

## 2017-09-18 DIAGNOSIS — C3490 Malignant neoplasm of unspecified part of unspecified bronchus or lung: Secondary | ICD-10-CM

## 2017-09-18 DIAGNOSIS — C7800 Secondary malignant neoplasm of unspecified lung: Secondary | ICD-10-CM | POA: Diagnosis not present

## 2017-09-18 DIAGNOSIS — C770 Secondary and unspecified malignant neoplasm of lymph nodes of head, face and neck: Secondary | ICD-10-CM | POA: Diagnosis not present

## 2017-09-18 MED ORDER — PEGFILGRASTIM 6 MG/0.6ML ~~LOC~~ PSKT
6.0000 mg | PREFILLED_SYRINGE | Freq: Once | SUBCUTANEOUS | Status: AC
  Administered 2017-09-18: 6 mg via SUBCUTANEOUS

## 2017-09-18 MED ORDER — SODIUM CHLORIDE 0.9% FLUSH
10.0000 mL | INTRAVENOUS | Status: DC | PRN
  Administered 2017-09-18: 10 mL
  Filled 2017-09-18: qty 10

## 2017-09-18 MED ORDER — PEGFILGRASTIM 6 MG/0.6ML ~~LOC~~ PSKT
PREFILLED_SYRINGE | SUBCUTANEOUS | Status: AC
Start: 2017-09-18 — End: 2017-09-18
  Filled 2017-09-18: qty 0.6

## 2017-09-18 MED ORDER — TOPOTECAN HCL CHEMO INJECTION 4 MG
1.5000 mg/m2 | Freq: Once | INTRAVENOUS | Status: AC
  Administered 2017-09-18: 2.9 mg via INTRAVENOUS
  Filled 2017-09-18: qty 2.9

## 2017-09-18 MED ORDER — PROCHLORPERAZINE MALEATE 10 MG PO TABS
ORAL_TABLET | ORAL | Status: AC
  Filled 2017-09-18: qty 1

## 2017-09-18 MED ORDER — HEPARIN SOD (PORK) LOCK FLUSH 100 UNIT/ML IV SOLN
500.0000 [IU] | Freq: Once | INTRAVENOUS | Status: AC | PRN
  Administered 2017-09-18: 500 [IU]

## 2017-09-18 MED ORDER — PROCHLORPERAZINE MALEATE 10 MG PO TABS
10.0000 mg | ORAL_TABLET | Freq: Once | ORAL | Status: AC
  Administered 2017-09-18: 10 mg via ORAL

## 2017-09-18 MED ORDER — SODIUM CHLORIDE 0.9 % IV SOLN
Freq: Once | INTRAVENOUS | Status: AC
  Administered 2017-09-18: 12:00:00 via INTRAVENOUS

## 2017-09-18 NOTE — Telephone Encounter (Signed)
Ok to refill??  Last office visit 05/19/2017.  Last refill 06/18/2017, #2 refills.

## 2017-09-18 NOTE — Patient Instructions (Signed)
[facility] Discharge Instructions for Patients Receiving Chemotherapy   Beginning January 23rd 2017 lab work for the [facility] will be done in the  Main lab at [facility] on 1st floor. If you have a lab appointment with the [facility] please come in thru the  Main Entrance and check in at the main information desk   Today you received the following chemotherapy agents Topotecan as well as Neulasta on-pro. Follow-up as scheduled. Call clinic for any questions or concerns  To help prevent nausea and vomiting after your treatment, we encourage you to take your nausea medication   If you develop nausea and vomiting, or diarrhea that is not controlled by your medication, call the clinic.  The clinic phone number is [phone]. Office hours are Monday-Friday 8:30am-5:00pm.  BELOW ARE SYMPTOMS THAT SHOULD BE REPORTED IMMEDIATELY:  *FEVER GREATER THAN 101.0 F  *CHILLS WITH OR WITHOUT FEVER  NAUSEA AND VOMITING THAT IS NOT CONTROLLED WITH YOUR NAUSEA MEDICATION  *UNUSUAL SHORTNESS OF BREATH  *UNUSUAL BRUISING OR BLEEDING  TENDERNESS IN MOUTH AND THROAT WITH OR WITHOUT PRESENCE OF ULCERS  *URINARY PROBLEMS  *BOWEL PROBLEMS  UNUSUAL RASH Items with * indicate a potential emergency and should be followed up as soon as possible. If you have an emergency after office hours please contact your primary care physician or go to the nearest emergency department.  Please call the clinic during office hours if you have any questions or concerns.   You may also contact the Patient Navigator at [phone] should you have any questions or need assistance in obtaining follow up care.      Resources For Cancer Patients and their Caregivers ? [employer]: Can assist with transportation, wigs, general needs, runs Look Good Feel Better.        941 423 9677 ? Cancer Care: Provides financial assistance, online support groups, medication/co-pay  assistance.  1-800-813-HOPE 956-469-9367) ? [facility] Assists [place] cancer patients and their families through emotional , educational and financial support.  (915) 241-8809 ? [place] DSS Where to apply for food stamps, Medicaid and utility assistance. 346-019-1180 ? [facility]: Transportation to medical appointments. (724)343-0229 ? Social Security Administration: May apply for disability if have a Stage IV cancer. 507-148-2401 281-228-3462 ? [employer], Disability and Transit Services: Assists with nutrition, care and transit needs. ([phone]

## 2017-09-18 NOTE — Progress Notes (Signed)
[name] tolerated Topotecan infusion with Neulasta on-pro well without complaints or incident. Neulasta on-pro applied to pt's right arm with green indicator light flashing. VSS upon discharge. Pt discharged self ambulatory in satisfactory condition

## 2017-10-04 NOTE — Progress Notes (Signed)
Diagnosis No diagnosis found.  Staging Cancer Staging No matching staging information was found for the patient.  Assessment and Plan: 1.  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  CT chest performed on 01/16/17 revealed extensive lymphadenopathy in chest and lower right cervical lymph node area; also indeterminate lesion in liver.  CT neck also performed on 01/16/17 also showed bulky (R) supraclavicular malignant lymph node measuring 5.4 cm, with several abnormal level 3 & 4 cervical lymph nodes and lymphadenopathy to superior mediastinum. IR attempted to place port-a-cath for anticipated chemotherapy; they were unable to place port d/t enlarged SVC and aborted procedure d/t concerns for patient safety.  Biopsy of (R) neck mass revealed small cell carcinoma, most consistent with a lung primary.   PET scan does reveal hypermetabolic liver lesion, as well as multiple sites of hypermetabolic lymphadenopathy. He understands that his cancer is treatable, but not curable.  Completed 6 cycles of cisplatin+ etoposide on 05/27/17.  PET scan done 08/13/2017  shows irregular solid hypermetabolic pulmonary nodules in the bilateral lower lobes. Additionally there are new hypermetabolic solid left upper lobe pulmonary nodule. This was unfortunately findings most compatible with progressive pulmonary metastases. Dr. Talbert Cage spoke with Dr. Grayland Ormond regarding plan of care for this patient given new findings. He recommended salvage chemotherapy beginning immediately with Topotecan.   He is here today for Topotecan.  Labs are adequate for chemotherapy.  He will return to clinic in 3 weeks for his next cycle of chemotherapy and evaluation.  He is advised to notify the office if he has any problems prior to that next visit.  All questions answered and he expressed understanding of the treatment plan.  2. Smoking: He continues to smoke.   Cessation is recommended.  3.  Neuropathy: This was felt secondary to chemotherapy that was completed in November 2018. Patient received cisplatin and etoposide completed 05/27/2017.  He was started on gabapentin 300 mg at bedtime by Dr. Talbert Cage.  Will continue to monitor symptoms as therapy proceeds.  4.  Anorexia.  We will continue to monitor his weight as therapy proceeds.  Have suggested nutritional supplements for the patient.  Interval History:  Pt presented to PCP in early 01/2017 with 3-week history of right neck mass.  CT chest performed on 01/16/17 revealed extensive lymphadenopathy in chest and lower right cervical lymph node area; also indeterminate lesion in liver.  CT neck also performed on 01/16/17 also showed bulky (R) supraclavicular malignant lymph node measuring 5.4 cm, with several abnormal level 3 & 4 cervical lymph nodes and lymphadenopathy to superior mediastinum. IR attempted to place port-a-cath for anticipated chemotherapy; they were unable to place port d/t enlarged SVC and aborted procedure d/t concerns for patient safety.  Biopsy of (R) neck mass revealed small cell carcinoma, most consistent with a lung primary.   PET scan does reveal hypermetabolic liver lesion, as well as multiple sites of hypermetabolic lymphadenopathy. He understands that his cancer is treatable, but not curable.  Completed 6 cycles of cisplatin+ etoposide on 05/27/17.  PET scan done 08/13/2017  shows irregular solid hypermetabolic pulmonary nodules in the bilateral lower lobes. Additionally there are new hypermetabolic solid left upper lobe pulmonary nodule. This was unfortunately findings most compatible with progressive pulmonary metastases. Dr. Talbert Cage spoke with Dr. Grayland Ormond regarding plan of care for this patient given new findings. He recommended salvage chemotherapy beginning immediately with Topotecan  Current Status: Patient is seen  today for evaluation prior to Topotecan.   He continues to smoke. He  denies any significant breathing issues today.       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 Bulky 5.4 cm right supraclavicular region malignant lymph node conglomeration with extracapsular extension. 2. Surrounding smaller abnormal right level 3 and level 5 lymph nodes, and the lymphadenopathy continues into the superior mediastinum, see Chest CT findings reported separately. 3. No other metastatic disease identified in the neck.      01/16/2017 Imaging    CT chest: IMPRESSION: 1. Extensive lymphadenopathy in the thorax and lower right cervical region, as discussed above. Primary differential considerations include lymphoma/leukemia or small cell carcinoma of the lung. Further evaluation a PET-CT could be considered to assess for additional sites of disease below the diaphragm if clinically appropriate. Additionally, ultrasound-guided biopsy of supraclavicular lymphadenopathy could be considered to establish a tissue diagnosis. 2. Indeterminate lesion in the periphery of segment 8 of the liver measuring 2.7 x 1.7 cm. Attention at time of follow-up PET-CT is recommended. 3. Aortic atherosclerosis, in addition to left main and 3 vessel coronary artery disease. Please note that although the presence of coronary artery calcium documents the presence of coronary artery disease, the severity of this disease and any potential stenosis cannot be assessed on this non-gated CT examination. Assessment for potential risk factor modification, dietary therapy or pharmacologic therapy may be warranted, if clinically indicated. 4. There are calcifications of the aortic valve. Echocardiographic correlation for evaluation of potential valvular dysfunction may be warranted if clinically indicated. 5. Diffuse bronchial wall thickening with moderate centrilobular and paraseptal emphysema; imaging findings suggestive of  underlying COPD.      02/03/2017 Initial Biopsy    (R) neck lymph node biopsy: SMALL CELL CARCINOMA (most likely lung primary).       02/03/2017 Miscellaneous    Port-a-cath attempted by IR; unable to place d/t enlarged SVC.       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 ([place])      02/09/2017 - 05/27/2017 Chemotherapy    6 cycles of cisplatin+etoposide       02/11/2017 Imaging    MRI brain: CLINICAL DATA:  Advanced stage small cell lung cancer. Staging for metastatic disease  EXAM: MRI HEAD WITHOUT AND WITH CONTRAST  TECHNIQUE: Multiplanar, multiecho pulse sequences of the brain and surrounding structures were obtained without and with intravenous contrast.  CONTRAST:  65m MULTIHANCE GADOBENATE DIMEGLUMINE 529 MG/ML IV SOLN  COMPARISON:  None.  FINDINGS: Brain: Negative for hydrocephalus. Cerebral volume normal for age. Small nonenhancing white matter hyperintensities consistent with mild chronic microvascular ischemia. No acute infarct. Negative for hemorrhage or mass or edema  Normal enhancement postcontrast infusion. No enhancing mass lesion. Leptomeningeal enhancement is normal.  Vascular: Normal arterial flow voids.  Normal venous enhancement  Skull and upper cervical spine: Negative  Sinuses/Orbits: Negative  Other: None  IMPRESSION: Negative for metastatic disease.  No acute abnormality.  Mild chronic white matter changes.      04/07/2017 Imaging     PET:  1. Marked reduction in size and metabolic activity of bulky RIGHT supraclavicular adenopathy mediastinal lymphadenopathy. 2. Residual moderate activity remains within small RIGHT supraclavicular lymph node, RIGHT lower paratracheal lymph node and RIGHT hilar lymph node. 3. Resolution of prevascular and internal mammary mediastinal metastatic hypermetabolic activity. 4. Resolution of metabolic activity associated with solitary RIGHT hepatic  lobe liver metastasis. 5. No evidence of disease progression. 6. 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      05/27/2017 Imaging    MRI brain w/ and w/o contrast IMPRESSION: 1. No metastatic disease identified. 2. Increased nonspecific cerebral white matter signal changes since August. These are most commonly small vessel disease related. 3. New right maxillary sinusitis. Benign appearing retention cysts in the nasopharynx with trace mastoid effusions.      06/12/2017 Imaging    PET-CT IMPRESSION: 1. There are two new hypermetabolic nodules identified within both lower lobes measuring up to 3.1 cm. The appearance is nonspecific and may be inflammatory/infectious in etiology. Pulmonary metastatic disease cannot be excluded and short-term follow-up imaging in 3 months is advised to reassess these nodules. 2. Stable appearance of mild hypermetabolic activity associated with right paratracheal and right hilar lymph nodes. 3. Decrease in FDG uptake associated with index right supraclavicular lymph node. 4. 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 5. Aortic Atherosclerosis (ICD10-I70.0) and Emphysema (ICD10-J43.9).        Problem List Patient Active Problem List   Diagnosis Date Noted  . Extensive stage primary small cell carcinoma of lung ([place]) [C34.90] 02/05/2017  . Lymphadenopathy of head and neck [R59.1] 01/22/2017  . Osteopenia [M85.80] 03/30/2015  . Clubbing of fingers [R68.3] 04/14/2014  . Hyperglycemia [R73.9] 10/20/2013  . Smoker [F17.200]   . Closed dislocation of acromioclavicular joint [S43.109A] 04/11/2010  . BREAST MASS, LEFT [N63.0] 01/20/2008  . ONYCHOMYCOSIS, TOENAILS [B35.1] 10/20/2007  . MUSCLE CRAMPS [R25.2] 10/20/2007  . Hypercholesterolemia [E78.00]  05/28/2007  . CATARACT NOS [H26.9] 08/28/2006  . DISTURBANCE, VISUAL NOS [H53.9] 08/19/2006  . 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 WITHDRAWAL, DRUG [F19.939] 06/24/2006  . ANXIETY [F41.1] 06/24/2006  . DEPRESSION [F32.9] 06/24/2006  . MYOCARDIAL INFARCTION, HX OF [I25.2] 06/24/2006  . Coronary atherosclerosis [I25.10] 06/24/2006  . CARDIAC ARRHYTHMIA [I49.9] 06/24/2006  . CONSTIPATION [K59.00] 06/24/2006  . OSTEOARTHRITIS [M19.90] 06/24/2006  . LOW BACK PAIN [M54.5] 06/24/2006  . INSOMNIA [G47.00] 06/24/2006    Past Medical History Past Medical History:  Diagnosis Date  . Anxiety   . CAD (coronary artery disease)   . Cancer ([place])    stage 4 small cell lung cancer   . COPD (chronic obstructive pulmonary disease) ([place])   . Depression   . Dyspnea    increased exertion  . Feeling of chest tightness   . Heart palpitations   . History of chemotherapy   . Myocardial infarction ([place])   . Osteopenia   . Panic attacks   . Smoker     Past Surgical History Past Surgical History:  Procedure Laterality Date  . BACK SURGERY  12/24/2000   L5,S1  . CORONARY STENT PLACEMENT  2005   RCA & CX  . HERNIA REPAIR Right 1980's  . INGUINAL HERNIA REPAIR  12/1978   right side  . IR FLUORO GUIDE PORT INSERTION RIGHT  04/02/2017  . IR US GUIDE BX ASP/DRAIN  02/03/2017  . IR US GUIDE VASC ACCESS RIGHT  04/02/2017  . NM MYOCAR PERF WALL MOTION  09/07/2009   No ischemia; EF 51%  . SHOULDER SURGERY Left 08/2010  . SPINE SURGERY  2002   L5-S1    Family History Family History  Problem Relation Age of Onset  . Heart attack  Father   . Kidney disease Father        renal failure  . Heart failure Mother   . Heart attack Mother   . Cancer Brother   . Diabetes Brother   . Alcohol abuse Brother   . Diabetes Sister      Social History  reports that he has been smoking cigarettes.  He has been smoking about 1.00 pack per day. He has never used  smokeless tobacco. He reports that he drinks alcohol. He reports that he does not use drugs.  Medications  Current Outpatient Medications:  .  atorvastatin (LIPITOR) 10 MG tablet, TAKE 1 TABLET AT BEDTIME  (DISCONTINUE ATORVASTATIN 20MG), Disp: 90 tablet, Rfl: 3 .  clotrimazole-betamethasone (LOTRISONE) cream, Apply 1 application topically 2 (two) times daily., Disp: 30 g, Rfl: 0 .  cyclobenzaprine (FLEXERIL) 5 MG tablet, Take 1 tablet (5 mg total) 3 (three) times daily as needed by mouth for muscle spasms., Disp: 30 tablet, Rfl: 1 .  dexamethasone (DECADRON) 4 MG tablet, , Disp: , Rfl:  .  gabapentin (NEURONTIN) 300 MG capsule, Take 1 capsule (300 mg total) by mouth at bedtime., Disp: 30 capsule, Rfl: 1 .  HYDROcodone-acetaminophen (NORCO) 5-325 MG tablet, Take 1 tablet by mouth every 6 (six) hours as needed for moderate pain., Disp: 75 tablet, Rfl: 0 .  lidocaine-prilocaine (EMLA) cream, Apply to affected area once, Disp: 30 g, Rfl: 3 .  ondansetron (ZOFRAN) 8 MG tablet, Take 1 tablet (8 mg total) 2 (two) times daily as needed by mouth. Start on the third day after cisplatin chemotherapy., Disp: 30 tablet, Rfl: 1 .  Pegfilgrastim (NEULASTA ONPRO [place]), Inject into the skin. Every 21 days, Disp: , Rfl:  .  polyethylene glycol powder (GLYCOLAX/MIRALAX) powder, Take 17 g by mouth daily., Disp: 3350 g, Rfl: 2 .  prochlorperazine (COMPAZINE) 10 MG tablet, Take 1 tablet (10 mg total) by mouth every 6 (six) hours as needed (Nausea or vomiting)., Disp: 30 tablet, Rfl: 1 .  topotecan in sodium chloride 0.9 % 100 mL, Inject into the vein once. Days 1-5 every 21 days, Disp: , Rfl:  .  traZODone (DESYREL) 100 MG tablet, Take 400 mg by mouth at bedtime., Disp: , Rfl:  .  ALPRAZolam (XANAX) 1 MG tablet, TAKE (1) TABLET BY MOUTH (4) TIMES DAILY., Disp: 120 tablet, Rfl: 0 .  cyclobenzaprine (FLEXERIL) 5 MG tablet, TAKE 1 TABLET THREE TIMES DAILY AS NEEDED FOR MUSCLE SPASMS., Disp: 30 tablet, Rfl:  0  Allergies Codeine and Niaspan [niacin er]  Review of Systems Review of Systems - Oncology ROS as per HPI otherwise 12 point ROS is negative.   Physical Exam  Vitals Wt Readings from Last 3 Encounters:  09/18/17 164 lb 6.4 oz (74.6 kg)  09/17/17 164 lb 9.6 oz (74.7 kg)  09/16/17 162 lb 4.8 oz (73.6 kg)   Temp Readings from Last 3 Encounters:  09/18/17 97.7 F (36.5 C) (Oral)  09/17/17 97.7 F (36.5 C) (Oral)  09/16/17 97.9 F (36.6 C) (Oral)   BP Readings from Last 3 Encounters:  09/18/17 (!) 113/52  09/17/17 (!) 113/48  09/16/17 111/68   Pulse Readings from Last 3 Encounters:  09/18/17 73  09/17/17 75  09/16/17 73   Constitutional: Well-developed, well-nourished, and in no distress.   HENT: Head: Normocephalic and atraumatic.  Mouth/Throat: No oropharyngeal exudate. Mucosa moist. Eyes: Pupils are equal, round, and reactive to light. Conjunctivae are normal. No scleral icterus.  Neck: Normal range of motion. Neck  supple. No JVD present.  Cardiovascular: Normal rate, regular rhythm and normal heart sounds. Pulmonary/Chest: Effort normal, coarse BS.   Abdominal: Soft. Bowel sounds are normal. No distension. There is no tenderness. There is no guarding.  Musculoskeletal: No edema or tenderness.  Lymphadenopathy: No cervical, axillary or supraclavicular adenopathy.  Neurological: Alert and oriented to person, place, and time. No cranial nerve deficit.  Skin: Skin is warm and dry. No rash noted. No erythema. No pallor.  Psychiatric: Affect and judgment normal.   Labs Infusion on 09/14/2017  Component Date Value Ref Range Status  . LDH 09/14/2017 148  98 - 192 U/L Final   Performed at [facility], [address] [place], [place] [address]  . Sodium 09/14/2017 137  135 - 145 mmol/L Final  . Potassium 09/14/2017 4.1  3.5 - 5.1 mmol/L Final  . Chloride 09/14/2017 105  101 - 111 mmol/L Final  . CO2 09/14/2017 24  22 - 32 mmol/L Final  . Glucose, Bld 09/14/2017 127*  65 - 99 mg/dL Final  . BUN 09/14/2017 13  6 - 20 mg/dL Final  . Creatinine, Ser 09/14/2017 0.84  0.61 - 1.24 mg/dL Final  . Calcium 09/14/2017 8.8* 8.9 - 10.3 mg/dL Final  . Total Protein 09/14/2017 6.0* 6.5 - 8.1 g/dL Final  . Albumin 09/14/2017 3.5  3.5 - 5.0 g/dL Final  . AST 09/14/2017 20  15 - 41 U/L Final  . ALT 09/14/2017 13* 17 - 63 U/L Final  . Alkaline Phosphatase 09/14/2017 65  38 - 126 U/L Final  . Total Bilirubin 09/14/2017 0.2* 0.3 - 1.2 mg/dL Final  . GFR calc non Af Amer 09/14/2017 >60  >60 mL/min Final  . GFR calc Af Amer 09/14/2017 >60  >60 mL/min Final   Comment: (NOTE) The eGFR has been calculated using the CKD EPI equation. This calculation has not been validated in all clinical situations. eGFR's persistently <60 mL/min signify possible Chronic Kidney Disease.   Georgiann Hahn gap 09/14/2017 8  5 - 15 Final   Performed at [facility], [address], [place], [place] [address]  . WBC 09/14/2017 7.3  4.0 - 10.5 K/uL Final  . RBC 09/14/2017 3.62* 4.22 - 5.81 MIL/uL Final  . Hemoglobin 09/14/2017 10.9* 13.0 - 17.0 g/dL Final  . HCT 09/14/2017 33.6* 39.0 - 52.0 % Final  . MCV 09/14/2017 92.8  78.0 - 100.0 fL Final  . MCH 09/14/2017 30.1  26.0 - 34.0 pg Final  . MCHC 09/14/2017 32.4  30.0 - 36.0 g/dL Final  . RDW 09/14/2017 16.4* 11.5 - 15.5 % Final  . Platelets 09/14/2017 293  150 - 400 K/uL Final  . Neutrophils Relative % 09/14/2017 72  % Final  . Lymphocytes Relative 09/14/2017 17  % Final  . Monocytes Relative 09/14/2017 10  % Final  . Eosinophils Relative 09/14/2017 1  % Final  . Basophils Relative 09/14/2017 0  % Final  . Neutro Abs 09/14/2017 5.3  1.7 - 7.7 K/uL Final  . Lymphs Abs 09/14/2017 1.2  0.7 - 4.0 K/uL Final  . Monocytes Absolute 09/14/2017 0.7  0.1 - 1.0 K/uL Final  . Eosinophils Absolute 09/14/2017 0.1  0.0 - 0.7 K/uL Final  . Basophils Absolute 09/14/2017 0.0  0.0 - 0.1 K/uL Final  . WBC Morphology 09/14/2017 MILD LEFT SHIFT (1-5% METAS, OCC MYELO,  OCC BANDS)   Final   Performed at [facility], [address] [place], [place] [address]     Pathology No orders of  the defined types were placed in this encounter.      Zoila Shutter MD

## 2017-10-05 ENCOUNTER — Inpatient Hospital Stay (HOSPITAL_BASED_OUTPATIENT_CLINIC_OR_DEPARTMENT_OTHER): Payer: Medicare PPO | Admitting: Hematology

## 2017-10-05 ENCOUNTER — Other Ambulatory Visit: Payer: Self-pay

## 2017-10-05 ENCOUNTER — Encounter (HOSPITAL_COMMUNITY): Payer: Self-pay | Admitting: Hematology

## 2017-10-05 ENCOUNTER — Inpatient Hospital Stay (HOSPITAL_COMMUNITY): Payer: Medicare PPO

## 2017-10-05 VITALS — BP 138/53 | HR 76 | Temp 97.4°F | Resp 18 | Wt 164.2 lb

## 2017-10-05 VITALS — BP 126/56 | HR 68 | Temp 97.4°F | Resp 18 | Wt 164.2 lb

## 2017-10-05 DIAGNOSIS — G620 Drug-induced polyneuropathy: Secondary | ICD-10-CM

## 2017-10-05 DIAGNOSIS — K5900 Constipation, unspecified: Secondary | ICD-10-CM | POA: Diagnosis not present

## 2017-10-05 DIAGNOSIS — C7800 Secondary malignant neoplasm of unspecified lung: Secondary | ICD-10-CM | POA: Diagnosis not present

## 2017-10-05 DIAGNOSIS — R112 Nausea with vomiting, unspecified: Secondary | ICD-10-CM | POA: Diagnosis not present

## 2017-10-05 DIAGNOSIS — C3490 Malignant neoplasm of unspecified part of unspecified bronchus or lung: Secondary | ICD-10-CM

## 2017-10-05 DIAGNOSIS — R630 Anorexia: Secondary | ICD-10-CM

## 2017-10-05 DIAGNOSIS — R5383 Other fatigue: Secondary | ICD-10-CM | POA: Diagnosis not present

## 2017-10-05 DIAGNOSIS — D6481 Anemia due to antineoplastic chemotherapy: Secondary | ICD-10-CM | POA: Diagnosis not present

## 2017-10-05 DIAGNOSIS — T451X5A Adverse effect of antineoplastic and immunosuppressive drugs, initial encounter: Secondary | ICD-10-CM | POA: Diagnosis not present

## 2017-10-05 DIAGNOSIS — M549 Dorsalgia, unspecified: Secondary | ICD-10-CM | POA: Diagnosis not present

## 2017-10-05 DIAGNOSIS — Z5111 Encounter for antineoplastic chemotherapy: Secondary | ICD-10-CM | POA: Diagnosis not present

## 2017-10-05 DIAGNOSIS — C787 Secondary malignant neoplasm of liver and intrahepatic bile duct: Secondary | ICD-10-CM

## 2017-10-05 DIAGNOSIS — C770 Secondary and unspecified malignant neoplasm of lymph nodes of head, face and neck: Secondary | ICD-10-CM | POA: Diagnosis not present

## 2017-10-05 LAB — COMPREHENSIVE METABOLIC PANEL
ALT: 10 U/L — ABNORMAL LOW (ref 17–63)
AST: 15 U/L (ref 15–41)
Albumin: 3.3 g/dL — ABNORMAL LOW (ref 3.5–5.0)
Alkaline Phosphatase: 68 U/L (ref 38–126)
Anion gap: 8 (ref 5–15)
BUN: 19 mg/dL (ref 6–20)
CO2: 24 mmol/L (ref 22–32)
Calcium: 8.4 mg/dL — ABNORMAL LOW (ref 8.9–10.3)
Chloride: 104 mmol/L (ref 101–111)
Creatinine, Ser: 0.86 mg/dL (ref 0.61–1.24)
GFR calc Af Amer: >60 mL/min (ref >60)
GFR calc non Af Amer: >60 mL/min (ref >60)
Glucose, Bld: 113 mg/dL — ABNORMAL HIGH (ref 65–99)
Potassium: 4.2 mmol/L (ref 3.5–5.1)
Sodium: 136 mmol/L (ref 135–145)
Total Bilirubin: 0.5 mg/dL (ref 0.3–1.2)
Total Protein: 6 g/dL — ABNORMAL LOW (ref 6.5–8.1)

## 2017-10-05 LAB — IRON AND TIBC
Iron: 34 ug/dL — ABNORMAL LOW (ref 45–182)
Saturation Ratios: 12 % — ABNORMAL LOW (ref 17.9–39.5)
TIBC: 283 ug/dL (ref 250–450)
UIBC: 249 ug/dL

## 2017-10-05 LAB — CBC WITH DIFFERENTIAL/PLATELET
Basophils Absolute: 0 10*3/uL (ref 0.0–0.1)
Basophils Relative: 0 %
Eosinophils Absolute: 0.1 10*3/uL (ref 0.0–0.7)
Eosinophils Relative: 1 %
HCT: 27.4 % — ABNORMAL LOW (ref 39.0–52.0)
Hemoglobin: 9.1 g/dL — ABNORMAL LOW (ref 13.0–17.0)
Lymphocytes Relative: 11 %
Lymphs Abs: 1.5 10*3/uL (ref 0.7–4.0)
MCH: 30.5 pg (ref 26.0–34.0)
MCHC: 33.2 g/dL (ref 30.0–36.0)
MCV: 91.9 fL (ref 78.0–100.0)
Monocytes Absolute: 1.4 10*3/uL — ABNORMAL HIGH (ref 0.1–1.0)
Monocytes Relative: 10 %
Neutro Abs: 10.5 10*3/uL — ABNORMAL HIGH (ref 1.7–7.7)
Neutrophils Relative %: 78 %
Platelets: 420 10*3/uL — ABNORMAL HIGH (ref 150–400)
RBC: 2.98 MIL/uL — ABNORMAL LOW (ref 4.22–5.81)
RDW: 18 % — ABNORMAL HIGH (ref 11.5–15.5)
WBC: 13.5 10*3/uL — ABNORMAL HIGH (ref 4.0–10.5)

## 2017-10-05 LAB — FERRITIN: Ferritin: 222 ng/mL (ref 24–336)

## 2017-10-05 LAB — VITAMIN B12: Vitamin B-12: 3684 pg/mL — ABNORMAL HIGH (ref 180–914)

## 2017-10-05 LAB — FOLATE: Folate: 9.7 ng/mL (ref >5.9)

## 2017-10-05 MED ORDER — SODIUM CHLORIDE 0.9 % IV SOLN
Freq: Once | INTRAVENOUS | Status: AC
  Administered 2017-10-05: 13:00:00 via INTRAVENOUS

## 2017-10-05 MED ORDER — PROCHLORPERAZINE MALEATE 10 MG PO TABS
ORAL_TABLET | ORAL | Status: AC
  Filled 2017-10-05: qty 1

## 2017-10-05 MED ORDER — PROCHLORPERAZINE MALEATE 10 MG PO TABS
10.0000 mg | ORAL_TABLET | Freq: Once | ORAL | Status: AC
  Administered 2017-10-05: 10 mg via ORAL

## 2017-10-05 MED ORDER — ONDANSETRON HCL 8 MG PO TABS: 8.0000 mg | ORAL_TABLET | Freq: Two times a day (BID) | ORAL | 1 refills | Status: AC | PRN

## 2017-10-05 MED ORDER — HEPARIN SOD (PORK) LOCK FLUSH 100 UNIT/ML IV SOLN
500.0000 [IU] | Freq: Once | INTRAVENOUS | Status: AC
  Administered 2017-10-05: 500 [IU] via INTRAVENOUS

## 2017-10-05 MED ORDER — TOPOTECAN HCL CHEMO INJECTION 4 MG
1.5000 mg/m2 | Freq: Once | INTRAVENOUS | Status: AC
  Administered 2017-10-05: 2.9 mg via INTRAVENOUS
  Filled 2017-10-05: qty 2.9

## 2017-10-05 NOTE — Progress Notes (Signed)
Tolerated infusion w/o adverse reaction.  Alert, in no distress.  VSS.  Discharged ambulatory.  

## 2017-10-05 NOTE — Patient Instructions (Signed)
[facility] at [facility] Discharge Instructions  Today you saw Dr. K.   Thank you for choosing [facility] at [facility] to provide your oncology and hematology care.  To afford each patient quality time with our provider, please arrive at least 15 minutes before your scheduled appointment time.   If you have a lab appointment with the Cancer Center please come in thru the  Main Entrance and check in at the main information desk  You need to re-schedule your appointment should you arrive 10 or more minutes late.  We strive to give you quality time with our providers, and arriving late affects you and other patients whose appointments are after yours.  Also, if you no show three or more times for appointments you may be dismissed from the clinic at the providers discretion.     Again, thank you for choosing [facility].  Our hope is that these requests will decrease the amount of time that you wait before being seen by our physicians.       _____________________________________________________________  Should you have questions after your visit to [facility], please contact our office at [phone] between the hours of 8:30 a.m. and 4:30 p.m.  Voicemails left after 4:30 p.m. will not be returned until the following business day.  For prescription refill requests, have your pharmacy contact our office.       Resources For Cancer Patients and their Caregivers ? American Cancer Society: Can assist with transportation, wigs, general needs, runs Look Good Feel Better.        1-888-227-6333 ? Cancer Care: Provides financial assistance, online support groups, medication/co-pay assistance.  1-800-813-HOPE (4673) ? [facility] Assists [place] cancer patients and their families through emotional , educational and financial support.  336-427-4357 ? [place] DSS Where to apply for food  stamps, Medicaid and utility assistance. 336-342-1394 ? [facility]: Transportation to medical appointments. 336-347-2287 ? Social Security Administration: May apply for disability if have a Stage IV cancer. 336-342-7796 1-800-772-1213 ? [place] Aging, Disability and Transit Services: Assists with nutrition, care and transit needs. 336-349-2343  Cancer Center Support Programs:   > Cancer Support Group  2nd Tuesday of the month 1pm-2pm, Journey Room   > Creative Journey  3rd Tuesday of the month 1130am-1pm, Journey Room    

## 2017-10-05 NOTE — Progress Notes (Signed)
[place], [place] [address]   CLINIC:  Medical Oncology/Hematology  PCP:  [name], MD [address] [place] [address] [phone]   REASON FOR VISIT:  Follow-up for cycle 3 of chemotherapy.  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 Bulky 5.4 cm right supraclavicular region malignant lymph node conglomeration with extracapsular extension. 2. Surrounding smaller abnormal right level 3 and level 5 lymph nodes, and the lymphadenopathy continues into the superior mediastinum, see Chest CT findings reported separately. 3. No other metastatic disease identified in the neck.      01/16/2017 Imaging    CT chest: IMPRESSION: 1. Extensive lymphadenopathy in the thorax and lower right cervical region, as discussed above. Primary differential considerations include lymphoma/leukemia or small cell carcinoma of the lung. Further evaluation a PET-CT could be considered to assess for additional sites of disease below the diaphragm if clinically appropriate. Additionally, ultrasound-guided biopsy of supraclavicular lymphadenopathy could be considered to establish a tissue diagnosis. 2. Indeterminate lesion in the periphery of segment 8 of the liver measuring 2.7 x 1.7 cm. Attention at time of follow-up PET-CT is recommended. 3. Aortic atherosclerosis, in addition to left main and 3 vessel coronary artery disease. Please note that although the presence of coronary artery calcium documents the presence of coronary artery disease, the severity of this disease and any potential stenosis cannot be assessed on this non-gated CT examination. Assessment for potential risk factor modification, dietary therapy or pharmacologic therapy may be warranted, if clinically indicated. 4. There are  calcifications of the aortic valve. Echocardiographic correlation for evaluation of potential valvular dysfunction may be warranted if clinically indicated. 5. Diffuse bronchial wall thickening with moderate centrilobular and paraseptal emphysema; imaging findings suggestive of underlying COPD.      02/03/2017 Initial Biopsy    (R) neck lymph node biopsy: SMALL CELL CARCINOMA (most likely lung primary).       02/03/2017 Miscellaneous    Port-a-cath attempted by IR; unable to place d/t enlarged SVC.       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 Staging for metastatic disease  EXAM: MRI HEAD WITHOUT AND WITH CONTRAST  TECHNIQUE: Multiplanar, multiecho pulse sequences of the brain and surrounding structures were obtained without and with intravenous contrast.  CONTRAST:  27mL MULTIHANCE GADOBENATE DIMEGLUMINE 529 MG/ML IV SOLN  COMPARISON:  None.  FINDINGS: Brain: Negative for hydrocephalus. Cerebral volume normal for age. Small nonenhancing white matter hyperintensities consistent with mild chronic microvascular ischemia. No acute infarct. Negative for hemorrhage or mass or edema  Normal enhancement postcontrast infusion. No enhancing mass lesion. Leptomeningeal enhancement is normal.  Vascular: Normal arterial flow voids.  Normal venous enhancement  Skull and upper cervical spine: Negative  Sinuses/Orbits: Negative  Other: None  IMPRESSION: Negative for metastatic disease.  No acute abnormality.  Mild chronic white matter changes.      04/07/2017 Imaging     PET:  1. Marked reduction in size and metabolic activity of bulky RIGHT supraclavicular adenopathy mediastinal lymphadenopathy. 2. Residual moderate activity remains within  small RIGHT supraclavicular lymph node, RIGHT lower paratracheal lymph node and RIGHT hilar lymph node. 3. Resolution of prevascular and internal mammary mediastinal metastatic hypermetabolic activity. 4. Resolution of metabolic activity associated with solitary RIGHT hepatic lobe liver metastasis. 5. No evidence of disease progression. 6. 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      05/27/2017 Imaging    MRI brain w/ and w/o contrast IMPRESSION: 1. No metastatic disease identified. 2. Increased nonspecific cerebral white matter signal changes since August. These are most commonly small vessel disease related. 3. New right maxillary sinusitis. Benign appearing retention cysts in the nasopharynx with trace mastoid effusions.      06/12/2017 Imaging    PET-CT IMPRESSION: 1. There are two new hypermetabolic nodules identified within both lower lobes measuring up to 3.1 cm. The appearance is nonspecific and may be inflammatory/infectious in etiology. Pulmonary metastatic disease cannot be excluded and short-term follow-up imaging in 3 months is advised to reassess these nodules. 2. Stable appearance of mild hypermetabolic activity associated with right paratracheal and right hilar lymph nodes. 3. Decrease in FDG uptake associated with index right supraclavicular lymph node. 4. 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 5. Aortic Atherosclerosis (ICD10-I70.0) and Emphysema (ICD10-J43.9).        CANCER STAGING: Stage IV small cell lung cancer, liver metastasis  INTERVAL HISTORY:  Mr. Mccadden 67 y.o. male returns for routine follow-up and consideration for next cycle of chemotherapy.   Due for cycle #3  today.   He had some issues with nausea and vomiting.  His nausea medicine helps.   He also complained of tiredness since the start of this treatment.  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  His appetite has been down.  He denies any headaches or vision changes.  REVIEW OF SYSTEMS:  Review of Systems  Constitutional: Positive for fatigue.  HENT:  Negative.   Eyes: Negative.   Respiratory: Negative.   Cardiovascular: Negative.   Gastrointestinal: Positive for constipation and nausea.  Genitourinary: Negative.    Musculoskeletal: Positive for back pain.  Skin: Negative.   Neurological: Positive for numbness.  Psychiatric/Behavioral: Negative.      PAST MEDICAL/SURGICAL HISTORY:  Past Medical History:  Diagnosis Date  . Anxiety   . CAD (coronary artery disease)   . Cancer ([place])    stage 4 small cell lung cancer   . COPD (chronic obstructive pulmonary disease) ([place])   . Depression   . Dyspnea    increased exertion  . Feeling of chest tightness   . Heart palpitations   . History of chemotherapy   . Myocardial infarction ([facility])   . Osteopenia   . Panic attacks   . Smoker    Past Surgical History:  Procedure Laterality Date  . BACK SURGERY  12/24/2000   L5,S1  . CORONARY STENT PLACEMENT  2005   RCA & CX  . HERNIA REPAIR Right 1980's  . INGUINAL HERNIA REPAIR  12/1978   right side  . IR FLUORO GUIDE PORT INSERTION RIGHT  04/02/2017  . IR US GUIDE BX ASP/DRAIN  02/03/2017  . IR US GUIDE VASC ACCESS RIGHT  04/02/2017  . NM MYOCAR PERF WALL MOTION  09/07/2009   No ischemia; EF 51%  . SHOULDER SURGERY Left 08/2010  . [place] SURGERY  2002   L5-S1  SOCIAL HISTORY:  Social History   Socioeconomic History  . Marital status: Single    Spouse name: Not on file  . Number of children: Not on file  . Years of education: Not on file  . Highest education level: Not on file  Occupational History  . Not on file  Social Needs  . Financial resource strain: Not on file  . Food insecurity:    Worry: Not on file    Inability: Not on  file  . Transportation needs:    Medical: Not on file    Non-medical: Not on file  Tobacco Use  . Smoking status: Current Every Day Smoker    Packs/day: 1.00    Types: Cigarettes  . Smokeless tobacco: Never Used  Substance and Sexual Activity  . Alcohol use: Yes    Comment: occas  . Drug use: No  . Sexual activity: Not on file  Lifestyle  . Physical activity:    Days per week: Not on file    Minutes per session: Not on file  . Stress: Not on file  Relationships  . Social connections:    Talks on phone: Not on file    Gets together: Not on file    Attends religious service: Not on file    Active member of club or organization: Not on file    Attends meetings of clubs or organizations: Not on file    Relationship status: Not on file  . Intimate partner violence:    Fear of current or ex partner: Not on file    Emotionally abused: Not on file    Physically abused: Not on file    Forced sexual activity: Not on file  Other Topics Concern  . Not on file  Social History Narrative  . Not on file    FAMILY HISTORY:  Family History  Problem Relation Age of Onset  . Heart attack Father   . Kidney disease Father        renal failure  . Heart failure Mother   . Heart attack Mother   . Cancer Brother   . Diabetes Brother   . Alcohol abuse Brother   . Diabetes Sister     CURRENT MEDICATIONS:  Outpatient Encounter Medications as of 10/05/2017  Medication Sig  . ALPRAZolam (XANAX) 1 MG tablet TAKE (1) TABLET BY MOUTH (4) TIMES DAILY.  Marland Kitchen atorvastatin (LIPITOR) 10 MG tablet TAKE 1 TABLET AT BEDTIME  (DISCONTINUE ATORVASTATIN 20MG )  . clotrimazole-betamethasone (LOTRISONE) cream Apply 1 application topically 2 (two) times daily.  . cyclobenzaprine (FLEXERIL) 5 MG tablet Take 1 tablet (5 mg total) 3 (three) times daily as needed by mouth for muscle spasms.  . cyclobenzaprine (FLEXERIL) 5 MG tablet TAKE 1 TABLET THREE TIMES DAILY AS NEEDED FOR MUSCLE SPASMS.  Marland Kitchen dexamethasone  (DECADRON) 4 MG tablet   . gabapentin (NEURONTIN) 300 MG capsule Take 1 capsule (300 mg total) by mouth at bedtime.  Marland Kitchen HYDROcodone-acetaminophen (NORCO) 5-325 MG tablet Take 1 tablet by mouth every 6 (six) hours as needed for moderate pain.  Marland Kitchen lidocaine-prilocaine (EMLA) cream Apply to affected area once  . ondansetron (ZOFRAN) 8 MG tablet Take 1 tablet (8 mg total) 2 (two) times daily as needed by mouth. Start on the third day after cisplatin chemotherapy.  . Pegfilgrastim (NEULASTA ONPRO [place]) Inject into the skin. Every 21 days  . polyethylene glycol powder (GLYCOLAX/MIRALAX) powder Take 17 g by mouth daily.  . prochlorperazine (COMPAZINE) 10 MG tablet Take 1 tablet (  10 mg total) by mouth every 6 (six) hours as needed (Nausea or vomiting).  . topotecan in sodium chloride 0.9 % 100 mL Inject into the vein once. Days 1-5 every 21 days  . traZODone (DESYREL) 100 MG tablet Take 400 mg by mouth at bedtime.   No facility-administered encounter medications on file as of 10/05/2017.     ALLERGIES:  Allergies  Allergen Reactions  . Codeine Nausea Only  . Niaspan [Niacin Er]      PHYSICAL EXAM:  ECOG Performance status: 1  Vitals:   10/05/17 1126  BP: (!) 138/53  Pulse: 76  Resp: 18  Temp: (!) 97.4 F (36.3 C)  SpO2: 96%   Filed Weights   10/05/17 1126  Weight: 164 lb 3.2 oz (74.5 kg)    Physical Exam  Constitutional: He is well-developed, well-nourished, and in no distress.  Cardiovascular: Normal rate, regular rhythm and normal heart sounds.  Pulmonary/Chest: Effort normal and breath sounds normal.  Abdominal: Soft. Bowel sounds are normal.  Psychiatric: Mood, memory, affect and judgment normal.     LABORATORY DATA:  I have reviewed the labs as listed.  CBC    Component Value Date/Time   WBC 13.5 (H) 10/05/2017 1118   RBC 2.98 (L) 10/05/2017 1118   HGB 9.1 (L) 10/05/2017 1118   HCT 27.4 (L) 10/05/2017 1118   PLT 420 (H) 10/05/2017 1118   MCV 91.9 10/05/2017 1118    MCH 30.5 10/05/2017 1118   MCHC 33.2 10/05/2017 1118   RDW 18.0 (H) 10/05/2017 1118   LYMPHSABS 1.5 10/05/2017 1118   MONOABS 1.4 (H) 10/05/2017 1118   EOSABS 0.1 10/05/2017 1118   BASOSABS 0.0 10/05/2017 1118   CMP Latest Ref Rng & Units 10/05/2017 09/14/2017 09/09/2017  Glucose 65 - 99 mg/dL 113(H) 127(H) 108(H)  BUN 6 - 20 mg/dL 19 13 14   Creatinine 0.61 - 1.24 mg/dL 0.86 0.84 0.90  Sodium 135 - 145 mmol/L 136 137 136  Potassium 3.5 - 5.1 mmol/L 4.2 4.1 3.8  Chloride 101 - 111 mmol/L 104 105 104  CO2 22 - 32 mmol/L 24 24 22   Calcium 8.9 - 10.3 mg/dL 8.4(L) 8.8(L) 9.0  Total Protein 6.5 - 8.1 g/dL 6.0(L) 6.0(L) 7.0  Total Bilirubin 0.3 - 1.2 mg/dL 0.5 0.2(L) 0.5  Alkaline Phos 38 - 126 U/L 68 65 78  AST 15 - 41 U/L 15 20 25   ALT 17 - 63 U/L 10(L) 13(L) 18       DIAGNOSTIC IMAGING:  His last PET scan on 08/13/2017 shows progression.     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  Small cell lung cancer with liver metastasis: - Status post 6 cycles of cisplatin and VP-16 completed on 05/25/2017 - PET/CT scan on 08/13/2017 showing progression -Topotecan (1.5 mg/m square) for 5 days every 21 days started on 08/24/2017, cycle 2 on 09/14/2017 Labs are adequate to proceed with cycle 3 of chemotherapy today.  He is having some nausea and vomiting and some tiredness.  His nausea medicine will be refilled.  I plan to repeat CT scans after this cycle to evaluate response.  MRI of the brain was scheduled in [place] on 10/28/2017.  2.  Normocytic anemia: This is chemotherapy-induced.  His hemoglobin prior to start of Topotecan was 12.4.  Dropped to 9.1.  Will check ferritin, iron panel, W38 and folic acid.  3.  Peripheral neuropathy: He has tingling in the hands and feet, which was  chemo induced.  He is taking Neurontin 300 mg at bedtime.  Neurontin makes him drowsy.  Hence I did not recommended to take during daytime.      Orders placed this  encounter:  Orders Placed This Encounter  Procedures  . Iron and TIBC  . Ferritin  . Vitamin B12  . Folate      Derek Jack, MD [facility] [phone]

## 2017-10-05 NOTE — Assessment & Plan Note (Signed)
1.  Small cell lung cancer with liver metastasis: - Status post 6 cycles of cisplatin and VP-16 completed on 05/25/2017 - PET/CT scan on 08/13/2017 showing progression -Topotecan (1.5 mg/m square) for 5 days every 21 days started on 08/24/2017, cycle 2 on 09/14/2017 Labs are adequate to proceed with cycle 3 of chemotherapy today.  He is having some nausea and vomiting and some tiredness.  His nausea medicine will be refilled.  I plan to repeat CT scans after this cycle to evaluate response.  MRI of the brain was scheduled in [place] on 10/28/2017.  2.  Normocytic anemia: This is chemotherapy-induced.  His hemoglobin prior to start of Topotecan was 12.4.  Dropped to 9.1.  Will check ferritin, iron panel, Y81 and folic acid.  3.  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  He is taking Neurontin 300 mg at bedtime.  Neurontin makes him drowsy.  Hence I did not recommended to take during daytime.

## 2017-10-06 ENCOUNTER — Encounter (HOSPITAL_COMMUNITY): Payer: Self-pay

## 2017-10-06 ENCOUNTER — Inpatient Hospital Stay (HOSPITAL_COMMUNITY): Payer: Medicare PPO

## 2017-10-06 VITALS — BP 133/58 | HR 76 | Temp 97.0°F | Resp 16

## 2017-10-06 DIAGNOSIS — C770 Secondary and unspecified malignant neoplasm of lymph nodes of head, face and neck: Secondary | ICD-10-CM | POA: Diagnosis not present

## 2017-10-06 DIAGNOSIS — C3490 Malignant neoplasm of unspecified part of unspecified bronchus or lung: Secondary | ICD-10-CM

## 2017-10-06 DIAGNOSIS — Z5111 Encounter for antineoplastic chemotherapy: Secondary | ICD-10-CM | POA: Diagnosis not present

## 2017-10-06 DIAGNOSIS — C7800 Secondary malignant neoplasm of unspecified lung: Secondary | ICD-10-CM | POA: Diagnosis not present

## 2017-10-06 DIAGNOSIS — C787 Secondary malignant neoplasm of liver and intrahepatic bile duct: Secondary | ICD-10-CM | POA: Diagnosis not present

## 2017-10-06 MED ORDER — SODIUM CHLORIDE 0.9% FLUSH
10.0000 mL | INTRAVENOUS | Status: DC | PRN
  Administered 2017-10-06: 10 mL
  Filled 2017-10-06: qty 10

## 2017-10-06 MED ORDER — TOPOTECAN HCL CHEMO INJECTION 4 MG
1.5000 mg/m2 | Freq: Once | INTRAVENOUS | Status: AC
  Administered 2017-10-06: 2.9 mg via INTRAVENOUS
  Filled 2017-10-06: qty 2.9

## 2017-10-06 MED ORDER — PROCHLORPERAZINE MALEATE 10 MG PO TABS
ORAL_TABLET | ORAL | Status: AC
Start: 2017-10-06 — End: ?
  Filled 2017-10-06: qty 1

## 2017-10-06 MED ORDER — PROCHLORPERAZINE MALEATE 10 MG PO TABS
10.0000 mg | ORAL_TABLET | Freq: Once | ORAL | Status: AC
  Administered 2017-10-06: 10 mg via ORAL

## 2017-10-06 MED ORDER — HEPARIN SOD (PORK) LOCK FLUSH 100 UNIT/ML IV SOLN
500.0000 [IU] | Freq: Once | INTRAVENOUS | Status: AC | PRN
  Administered 2017-10-06: 500 [IU]

## 2017-10-06 MED ORDER — SODIUM CHLORIDE 0.9 % IV SOLN
Freq: Once | INTRAVENOUS | Status: AC
  Administered 2017-10-06: 500 mL via INTRAVENOUS

## 2017-10-06 NOTE — Patient Instructions (Signed)
[facility] Discharge Instructions for Patients Receiving Chemotherapy  Today you received the following chemotherapy agents topetecan.    If you develop nausea and vomiting that is not controlled by your nausea medication, call the clinic.   BELOW ARE SYMPTOMS THAT SHOULD BE REPORTED IMMEDIATELY:  *FEVER GREATER THAN 100.5 F  *CHILLS WITH OR WITHOUT FEVER  NAUSEA AND VOMITING THAT IS NOT CONTROLLED WITH YOUR NAUSEA MEDICATION  *UNUSUAL SHORTNESS OF BREATH  *UNUSUAL BRUISING OR BLEEDING  TENDERNESS IN MOUTH AND THROAT WITH OR WITHOUT PRESENCE OF ULCERS  *URINARY PROBLEMS  *BOWEL PROBLEMS  UNUSUAL RASH Items with * indicate a potential emergency and should be followed up as soon as possible.  Feel free to call the clinic should you have any questions or concerns. The clinic phone number is [phone].  Please show the [facility] at check-in to the Emergency Department and triage nurse.

## 2017-10-06 NOTE — Progress Notes (Signed)
To treatment area for day 2 topetecan.  Patient complains of prn constipation.  Measures reviewed for management.  Denied pain, SOB, rashes, and itching.    Patient tolerated chemotherapy with no complaints voiced.  Port site clean and dry with no bruising or swelling noted at site.  Flushed easily with blood return noted after treatment.  No complaints of pain with flush.  Dressing intact.  VSS with discharge and left ambulatory with no s/s of distress noted.

## 2017-10-07 ENCOUNTER — Inpatient Hospital Stay (HOSPITAL_COMMUNITY): Payer: Medicare PPO

## 2017-10-07 VITALS — BP 120/56 | HR 79 | Temp 97.8°F | Resp 18

## 2017-10-07 DIAGNOSIS — C770 Secondary and unspecified malignant neoplasm of lymph nodes of head, face and neck: Secondary | ICD-10-CM | POA: Diagnosis not present

## 2017-10-07 DIAGNOSIS — C7800 Secondary malignant neoplasm of unspecified lung: Secondary | ICD-10-CM | POA: Diagnosis not present

## 2017-10-07 DIAGNOSIS — C3490 Malignant neoplasm of unspecified part of unspecified bronchus or lung: Secondary | ICD-10-CM | POA: Diagnosis not present

## 2017-10-07 DIAGNOSIS — C787 Secondary malignant neoplasm of liver and intrahepatic bile duct: Secondary | ICD-10-CM | POA: Diagnosis not present

## 2017-10-07 DIAGNOSIS — Z5111 Encounter for antineoplastic chemotherapy: Secondary | ICD-10-CM | POA: Diagnosis not present

## 2017-10-07 MED ORDER — HEPARIN SOD (PORK) LOCK FLUSH 100 UNIT/ML IV SOLN
500.0000 [IU] | Freq: Once | INTRAVENOUS | Status: AC | PRN
  Administered 2017-10-07: 500 [IU]

## 2017-10-07 MED ORDER — PROCHLORPERAZINE MALEATE 10 MG PO TABS
10.0000 mg | ORAL_TABLET | Freq: Once | ORAL | Status: AC
  Administered 2017-10-07: 10 mg via ORAL

## 2017-10-07 MED ORDER — SODIUM CHLORIDE 0.9 % IV SOLN
1.5000 mg/m2 | Freq: Once | INTRAVENOUS | Status: AC
  Administered 2017-10-07: 2.9 mg via INTRAVENOUS
  Filled 2017-10-07: qty 2.9

## 2017-10-07 MED ORDER — SODIUM CHLORIDE 0.9 % IV SOLN
Freq: Once | INTRAVENOUS | Status: AC
  Administered 2017-10-07: 12:00:00 via INTRAVENOUS

## 2017-10-07 MED ORDER — SODIUM CHLORIDE 0.9% FLUSH
10.0000 mL | INTRAVENOUS | Status: DC | PRN
  Administered 2017-10-07: 10 mL
  Filled 2017-10-07: qty 10

## 2017-10-07 MED ORDER — PROCHLORPERAZINE MALEATE 10 MG PO TABS
ORAL_TABLET | ORAL | Status: AC
  Filled 2017-10-07: qty 1

## 2017-10-07 NOTE — Patient Instructions (Signed)
[facility] Discharge Instructions for Patients Receiving Chemotherapy   Beginning January 23rd 2017 lab work for the Cancer Center will be done in the  Main lab at [facility] on 1st floor. If you have a lab appointment with the Cancer Center please come in thru the  Main Entrance and check in at the main information desk   Today you received the following chemotherapy agents   To help prevent nausea and vomiting after your treatment, we encourage you to take your nausea medication     If you develop nausea and vomiting, or diarrhea that is not controlled by your medication, call the clinic.  The clinic phone number is [phone]. Office hours are Monday-Friday 8:30am-5:00pm.  BELOW ARE SYMPTOMS THAT SHOULD BE REPORTED IMMEDIATELY:  *FEVER GREATER THAN 101.0 F  *CHILLS WITH OR WITHOUT FEVER  NAUSEA AND VOMITING THAT IS NOT CONTROLLED WITH YOUR NAUSEA MEDICATION  *UNUSUAL SHORTNESS OF BREATH  *UNUSUAL BRUISING OR BLEEDING  TENDERNESS IN MOUTH AND THROAT WITH OR WITHOUT PRESENCE OF ULCERS  *URINARY PROBLEMS  *BOWEL PROBLEMS  UNUSUAL RASH Items with * indicate a potential emergency and should be followed up as soon as possible. If you have an emergency after office hours please contact your primary care physician or go to the nearest emergency department.  Please call the clinic during office hours if you have any questions or concerns.   You may also contact the Patient Navigator at [phone] should you have any questions or need assistance in obtaining follow up care.      Resources For Cancer Patients and their Caregivers ? American Cancer Society: Can assist with transportation, wigs, general needs, runs Look Good Feel Better.        1-888-227-6333 ? Cancer Care: Provides financial assistance, online support groups, medication/co-pay assistance.  1-800-813-HOPE (4673) ? [facility] Assists [place] cancer  patients and their families through emotional , educational and financial support.  336-427-4357 ? [place] DSS Where to apply for food stamps, Medicaid and utility assistance. 336-342-1394 ? RC[facility]: Transportation to medical appointments. 336-347-2287 ? Social Security Administration: May apply for disability if have a Stage IV cancer. 336-342-7796 1-800-772-1213 ? [place] Aging, Disability and Transit Services: Assists with nutrition, care and transit needs. 336-349-2343         

## 2017-10-08 ENCOUNTER — Encounter (HOSPITAL_COMMUNITY): Payer: Self-pay

## 2017-10-08 ENCOUNTER — Inpatient Hospital Stay (HOSPITAL_COMMUNITY): Payer: Medicare PPO

## 2017-10-08 VITALS — BP 151/59 | HR 83 | Temp 97.5°F | Resp 18

## 2017-10-08 DIAGNOSIS — C787 Secondary malignant neoplasm of liver and intrahepatic bile duct: Secondary | ICD-10-CM | POA: Diagnosis not present

## 2017-10-08 DIAGNOSIS — C3490 Malignant neoplasm of unspecified part of unspecified bronchus or lung: Secondary | ICD-10-CM | POA: Diagnosis not present

## 2017-10-08 DIAGNOSIS — C770 Secondary and unspecified malignant neoplasm of lymph nodes of head, face and neck: Secondary | ICD-10-CM | POA: Diagnosis not present

## 2017-10-08 DIAGNOSIS — C7800 Secondary malignant neoplasm of unspecified lung: Secondary | ICD-10-CM | POA: Diagnosis not present

## 2017-10-08 DIAGNOSIS — Z5111 Encounter for antineoplastic chemotherapy: Secondary | ICD-10-CM | POA: Diagnosis not present

## 2017-10-08 MED ORDER — PROCHLORPERAZINE MALEATE 10 MG PO TABS
10.0000 mg | ORAL_TABLET | Freq: Once | ORAL | Status: AC
  Administered 2017-10-08: 10 mg via ORAL

## 2017-10-08 MED ORDER — SODIUM CHLORIDE 0.9 % IV SOLN
Freq: Once | INTRAVENOUS | Status: AC
  Administered 2017-10-08: 11:00:00 via INTRAVENOUS

## 2017-10-08 MED ORDER — SODIUM CHLORIDE 0.9% FLUSH
10.0000 mL | INTRAVENOUS | Status: DC | PRN
  Administered 2017-10-08: 10 mL
  Filled 2017-10-08: qty 10

## 2017-10-08 MED ORDER — PROCHLORPERAZINE MALEATE 10 MG PO TABS
ORAL_TABLET | ORAL | Status: AC
  Filled 2017-10-08: qty 1

## 2017-10-08 MED ORDER — HEPARIN SOD (PORK) LOCK FLUSH 100 UNIT/ML IV SOLN
INTRAVENOUS | Status: AC
  Filled 2017-10-08: qty 5

## 2017-10-08 MED ORDER — HEPARIN SOD (PORK) LOCK FLUSH 100 UNIT/ML IV SOLN: 500.0000 [IU] | Freq: Once | INTRAVENOUS | Status: DC | PRN

## 2017-10-08 MED ORDER — TOPOTECAN HCL CHEMO INJECTION 4 MG
1.5000 mg/m2 | Freq: Once | INTRAVENOUS | Status: AC
  Administered 2017-10-08: 2.9 mg via INTRAVENOUS
  Filled 2017-10-08: qty 2.9

## 2017-10-08 NOTE — Progress Notes (Signed)
Harvie Junior tolerated Topotecan infusion well without complaints or incident. VSS upon discharge. Port left accessed for use tomorrow.Pt discharged self ambulatory in satisfactory condition

## 2017-10-08 NOTE — Patient Instructions (Signed)
[facility] Discharge Instructions for Patients Receiving Chemotherapy   Beginning January 23rd 2017 lab work for the [facility] will be done in the  Main lab at [facility] on 1st floor. If you have a lab appointment with the [facility] please come in thru the  Main Entrance and check in at the main information desk   Today you received the following chemotherapy agents Topotecan. Follow-up as scheduled. Call clinic for any questions or concerns  To help prevent nausea and vomiting after your treatment, we encourage you to take your nausea medication   If you develop nausea and vomiting, or diarrhea that is not controlled by your medication, call the clinic.  The clinic phone number is [phone]. Office hours are Monday-Friday 8:30am-5:00pm.  BELOW ARE SYMPTOMS THAT SHOULD BE REPORTED IMMEDIATELY:  *FEVER GREATER THAN 101.0 F  *CHILLS WITH OR WITHOUT FEVER  NAUSEA AND VOMITING THAT IS NOT CONTROLLED WITH YOUR NAUSEA MEDICATION  *UNUSUAL SHORTNESS OF BREATH  *UNUSUAL BRUISING OR BLEEDING  TENDERNESS IN MOUTH AND THROAT WITH OR WITHOUT PRESENCE OF ULCERS  *URINARY PROBLEMS  *BOWEL PROBLEMS  UNUSUAL RASH Items with * indicate a potential emergency and should be followed up as soon as possible. If you have an emergency after office hours please contact your primary care physician or go to the nearest emergency department.  Please call the clinic during office hours if you have any questions or concerns.   You may also contact the Patient Navigator at [phone] should you have any questions or need assistance in obtaining follow up care.      Resources For Cancer Patients and their Caregivers ? American Cancer Society: Can assist with transportation, wigs, general needs, runs Look Good Feel Better.        317-045-9847 ? Cancer Care: Provides financial assistance, online support groups, medication/co-pay assistance.  1-800-813-HOPE  307-521-7538) ? [facility] Assists [place] cancer patients and their families through emotional , educational and financial support.  (717) 858-9281 ? [place] DSS Where to apply for food stamps, Medicaid and utility assistance. 212-527-1458 ? RCATS: Transportation to medical appointments. 310-067-2112 ? Social Security Administration: May apply for disability if have a Stage IV cancer. 906-620-5677 617-274-2047 ? [employer], Disability and Transit Services: Assists with nutrition, care and transit needs. 757-451-2395

## 2017-10-09 ENCOUNTER — Inpatient Hospital Stay (HOSPITAL_COMMUNITY): Payer: Medicare PPO

## 2017-10-09 ENCOUNTER — Encounter (HOSPITAL_COMMUNITY): Payer: Self-pay

## 2017-10-09 VITALS — BP 135/58 | HR 90 | Temp 97.4°F | Resp 18 | Wt 153.4 lb

## 2017-10-09 DIAGNOSIS — R112 Nausea with vomiting, unspecified: Secondary | ICD-10-CM

## 2017-10-09 DIAGNOSIS — C3490 Malignant neoplasm of unspecified part of unspecified bronchus or lung: Secondary | ICD-10-CM | POA: Diagnosis not present

## 2017-10-09 DIAGNOSIS — C770 Secondary and unspecified malignant neoplasm of lymph nodes of head, face and neck: Secondary | ICD-10-CM | POA: Diagnosis not present

## 2017-10-09 DIAGNOSIS — Z5111 Encounter for antineoplastic chemotherapy: Secondary | ICD-10-CM | POA: Diagnosis not present

## 2017-10-09 DIAGNOSIS — C7800 Secondary malignant neoplasm of unspecified lung: Secondary | ICD-10-CM | POA: Diagnosis not present

## 2017-10-09 DIAGNOSIS — C787 Secondary malignant neoplasm of liver and intrahepatic bile duct: Secondary | ICD-10-CM | POA: Diagnosis not present

## 2017-10-09 MED ORDER — LORAZEPAM 2 MG/ML IJ SOLN
INTRAMUSCULAR | Status: AC
  Filled 2017-10-09: qty 1

## 2017-10-09 MED ORDER — SODIUM CHLORIDE 0.9 % IV SOLN
Freq: Once | INTRAVENOUS | Status: AC
Start: 2017-10-09 — End: 2017-10-09
  Administered 2017-10-09: 500 mL via INTRAVENOUS

## 2017-10-09 MED ORDER — SODIUM CHLORIDE 0.9% FLUSH
10.0000 mL | INTRAVENOUS | Status: DC | PRN
  Administered 2017-10-09: 10 mL
  Filled 2017-10-09: qty 10

## 2017-10-09 MED ORDER — PROCHLORPERAZINE MALEATE 10 MG PO TABS
10.0000 mg | ORAL_TABLET | Freq: Once | ORAL | Status: AC
  Administered 2017-10-09: 10 mg via ORAL
  Filled 2017-10-09: qty 1

## 2017-10-09 MED ORDER — SODIUM CHLORIDE 0.9 % IV SOLN
INTRAVENOUS | Status: DC
  Administered 2017-10-09: 1000 mL via INTRAVENOUS

## 2017-10-09 MED ORDER — SODIUM CHLORIDE 0.9 % IV SOLN
1.5000 mg/m2 | Freq: Once | INTRAVENOUS | Status: AC
  Administered 2017-10-09: 2.9 mg via INTRAVENOUS
  Filled 2017-10-09: qty 2.9

## 2017-10-09 MED ORDER — LORAZEPAM 2 MG/ML IJ SOLN
0.5000 mg | INTRAMUSCULAR | Status: AC
  Administered 2017-10-09: 0.5 mg via INTRAVENOUS

## 2017-10-09 MED ORDER — PEGFILGRASTIM 6 MG/0.6ML ~~LOC~~ PSKT
6.0000 mg | PREFILLED_SYRINGE | Freq: Once | SUBCUTANEOUS | Status: AC
  Administered 2017-10-09: 6 mg via SUBCUTANEOUS
  Filled 2017-10-09: qty 0.6

## 2017-10-09 MED ORDER — SODIUM CHLORIDE 0.9 % IV SOLN
Freq: Once | INTRAVENOUS | Status: AC
  Administered 2017-10-09: 12:00:00 via INTRAVENOUS
  Filled 2017-10-09: qty 4

## 2017-10-09 MED ORDER — HEPARIN SOD (PORK) LOCK FLUSH 100 UNIT/ML IV SOLN
500.0000 [IU] | Freq: Once | INTRAVENOUS | Status: AC | PRN
  Administered 2017-10-09: 500 [IU]
  Filled 2017-10-09: qty 5

## 2017-10-09 NOTE — Progress Notes (Signed)
1100-To treatment area for day 5 chemotherapy and neulasta Onpro.  Patient stated he started having nausea and vomiting last night with poor relief from nausea medications.  Active nausea and vomiting in the treatment room this morning.  Reviewed patients symptoms and complaints with [name], NP, with verbal orders for Ativan, Zofran, and hydration for today.    1140-patient stated he feels better and ok to take small sips of fluids.  He stated he feels he can take his compazine medicine.    1356-Patient tolerated chemotherapy with hydration and nausea medicines with no complaints voiced.  Port site clean and dry with no complaints of pain with flush.  No bruising or swelling noted at site.  Band aid applied.  VSS with discharge and left ambulatory with no s/s of distress noted.

## 2017-10-09 NOTE — Patient Instructions (Signed)
[facility] Discharge Instructions for Patients Receiving Chemotherapy  Today you received the following chemotherapy agents topetecan.   If you develop nausea and vomiting that is not controlled by your nausea medication, call the clinic.   BELOW ARE SYMPTOMS THAT SHOULD BE REPORTED IMMEDIATELY:  *FEVER GREATER THAN 100.5 F  *CHILLS WITH OR WITHOUT FEVER  NAUSEA AND VOMITING THAT IS NOT CONTROLLED WITH YOUR NAUSEA MEDICATION  *UNUSUAL SHORTNESS OF BREATH  *UNUSUAL BRUISING OR BLEEDING  TENDERNESS IN MOUTH AND THROAT WITH OR WITHOUT PRESENCE OF ULCERS  *URINARY PROBLEMS  *BOWEL PROBLEMS  UNUSUAL RASH Items with * indicate a potential emergency and should be followed up as soon as possible.  Feel free to call the clinic should you have any questions or concerns. The clinic phone number is [phone].  Please show the [place] at check-in to the Emergency Department and triage nurse.

## 2017-10-15 ENCOUNTER — Other Ambulatory Visit: Payer: Self-pay | Admitting: Family Medicine

## 2017-10-15 NOTE — Telephone Encounter (Signed)
Will not do 90 day supply 360 of a controlled substance.  Will gladly refill monthly as we have been.

## 2017-10-15 NOTE — Telephone Encounter (Signed)
Patient calling to say that he needs refill on his alprazalam to be sent to [facility], he would like more than a 30 day supply so he dosent have to call every month  [place]  [phone]

## 2017-10-15 NOTE — Telephone Encounter (Signed)
Pt is requesting refill on Xanax  - He is requesting a 90 day supply but he gets 120 per month - ok to do 90 day supply.   LOV: 05/19/17  LRF:   09/18/17

## 2017-10-16 ENCOUNTER — Other Ambulatory Visit (HOSPITAL_COMMUNITY): Payer: Self-pay

## 2017-10-16 DIAGNOSIS — G620 Drug-induced polyneuropathy: Secondary | ICD-10-CM

## 2017-10-16 DIAGNOSIS — T451X5A Adverse effect of antineoplastic and immunosuppressive drugs, initial encounter: Principal | ICD-10-CM

## 2017-10-16 MED ORDER — ALPRAZOLAM 1 MG PO TABS: ORAL_TABLET | ORAL | 0 refills | Status: DC

## 2017-10-16 MED ORDER — GABAPENTIN 300 MG PO CAPS: 300.0000 mg | ORAL_CAPSULE | Freq: Every day | ORAL | 1 refills | Status: DC

## 2017-10-16 NOTE — Telephone Encounter (Signed)
Pt aware via vm that we will not be doing a 90 day supply for that. Can you send over a refill, he is due on 10/19/17.

## 2017-10-16 NOTE — Telephone Encounter (Signed)
Received refill request from patients pharmacy for gabapentin. Reviewed with provider, chart checked and refilled.

## 2017-10-21 ENCOUNTER — Ambulatory Visit (HOSPITAL_COMMUNITY)
Admission: RE | Admit: 2017-10-21 | Discharge: 2017-10-21 | Disposition: A | Discharge status: Home / Self Care | Payer: Medicare PPO | Source: Ambulatory Visit | Attending: Hematology | Admitting: Hematology

## 2017-10-21 DIAGNOSIS — C3490 Malignant neoplasm of unspecified part of unspecified bronchus or lung: Secondary | ICD-10-CM | POA: Diagnosis not present

## 2017-10-21 DIAGNOSIS — I2510 Atherosclerotic heart disease of native coronary artery without angina pectoris: Secondary | ICD-10-CM | POA: Insufficient documentation

## 2017-10-21 DIAGNOSIS — R918 Other nonspecific abnormal finding of lung field: Secondary | ICD-10-CM | POA: Diagnosis not present

## 2017-10-21 DIAGNOSIS — I700 Atherosclerosis of aorta: Secondary | ICD-10-CM | POA: Insufficient documentation

## 2017-10-21 DIAGNOSIS — J432 Centrilobular emphysema: Secondary | ICD-10-CM | POA: Diagnosis not present

## 2017-10-21 DIAGNOSIS — K5730 Diverticulosis of large intestine without perforation or abscess without bleeding: Secondary | ICD-10-CM | POA: Diagnosis not present

## 2017-10-21 MED ORDER — IOPAMIDOL (ISOVUE-300) INJECTION 61%
100.0000 mL | Freq: Once | INTRAVENOUS | Status: AC | PRN
  Administered 2017-10-21: 100 mL via INTRAVENOUS

## 2017-10-26 ENCOUNTER — Encounter (HOSPITAL_COMMUNITY): Payer: Self-pay | Admitting: Hematology

## 2017-10-26 ENCOUNTER — Inpatient Hospital Stay (HOSPITAL_COMMUNITY): Payer: Medicare PPO | Attending: Oncology

## 2017-10-26 ENCOUNTER — Other Ambulatory Visit: Payer: Self-pay

## 2017-10-26 ENCOUNTER — Encounter (HOSPITAL_COMMUNITY): Payer: Self-pay

## 2017-10-26 ENCOUNTER — Inpatient Hospital Stay (HOSPITAL_BASED_OUTPATIENT_CLINIC_OR_DEPARTMENT_OTHER): Payer: Medicare PPO | Admitting: Hematology

## 2017-10-26 VITALS — BP 142/59 | HR 73 | Temp 97.7°F | Resp 18 | Wt 157.4 lb

## 2017-10-26 DIAGNOSIS — R112 Nausea with vomiting, unspecified: Secondary | ICD-10-CM

## 2017-10-26 DIAGNOSIS — C3490 Malignant neoplasm of unspecified part of unspecified bronchus or lung: Secondary | ICD-10-CM | POA: Insufficient documentation

## 2017-10-26 DIAGNOSIS — C770 Secondary and unspecified malignant neoplasm of lymph nodes of head, face and neck: Secondary | ICD-10-CM | POA: Diagnosis not present

## 2017-10-26 DIAGNOSIS — R630 Anorexia: Secondary | ICD-10-CM | POA: Diagnosis not present

## 2017-10-26 DIAGNOSIS — Z5111 Encounter for antineoplastic chemotherapy: Secondary | ICD-10-CM | POA: Insufficient documentation

## 2017-10-26 DIAGNOSIS — C787 Secondary malignant neoplasm of liver and intrahepatic bile duct: Secondary | ICD-10-CM | POA: Diagnosis not present

## 2017-10-26 DIAGNOSIS — G620 Drug-induced polyneuropathy: Secondary | ICD-10-CM | POA: Diagnosis not present

## 2017-10-26 DIAGNOSIS — F17210 Nicotine dependence, cigarettes, uncomplicated: Secondary | ICD-10-CM | POA: Diagnosis not present

## 2017-10-26 DIAGNOSIS — T451X5A Adverse effect of antineoplastic and immunosuppressive drugs, initial encounter: Secondary | ICD-10-CM | POA: Diagnosis not present

## 2017-10-26 DIAGNOSIS — D649 Anemia, unspecified: Secondary | ICD-10-CM

## 2017-10-26 LAB — COMPREHENSIVE METABOLIC PANEL
ALT: 13 U/L — ABNORMAL LOW (ref 17–63)
AST: 15 U/L (ref 15–41)
Albumin: 3.6 g/dL (ref 3.5–5.0)
Alkaline Phosphatase: 61 U/L (ref 38–126)
Anion gap: 7 (ref 5–15)
BUN: 17 mg/dL (ref 6–20)
CO2: 25 mmol/L (ref 22–32)
Calcium: 8.8 mg/dL — ABNORMAL LOW (ref 8.9–10.3)
Chloride: 103 mmol/L (ref 101–111)
Creatinine, Ser: 0.81 mg/dL (ref 0.61–1.24)
GFR calc Af Amer: >60 mL/min (ref >60)
GFR calc non Af Amer: >60 mL/min (ref >60)
Glucose, Bld: 108 mg/dL — ABNORMAL HIGH (ref 65–99)
Potassium: 3.9 mmol/L (ref 3.5–5.1)
Sodium: 135 mmol/L (ref 135–145)
Total Bilirubin: 0.6 mg/dL (ref 0.3–1.2)
Total Protein: 6.5 g/dL (ref 6.5–8.1)

## 2017-10-26 LAB — CBC WITH DIFFERENTIAL/PLATELET
Basophils Absolute: 0 10*3/uL (ref 0.0–0.1)
Basophils Relative: 0 %
Eosinophils Absolute: 0.1 10*3/uL (ref 0.0–0.7)
Eosinophils Relative: 1 %
HCT: 27.2 % — ABNORMAL LOW (ref 39.0–52.0)
Hemoglobin: 8.9 g/dL — ABNORMAL LOW (ref 13.0–17.0)
Lymphocytes Relative: 12 %
Lymphs Abs: 1 10*3/uL (ref 0.7–4.0)
MCH: 31.6 pg (ref 26.0–34.0)
MCHC: 32.7 g/dL (ref 30.0–36.0)
MCV: 96.5 fL (ref 78.0–100.0)
Monocytes Absolute: 0.6 10*3/uL (ref 0.1–1.0)
Monocytes Relative: 8 %
Neutro Abs: 6.4 10*3/uL (ref 1.7–7.7)
Neutrophils Relative %: 79 %
Platelets: 461 10*3/uL — ABNORMAL HIGH (ref 150–400)
RBC: 2.82 MIL/uL — ABNORMAL LOW (ref 4.22–5.81)
RDW: 23 % — ABNORMAL HIGH (ref 11.5–15.5)
WBC: 8.1 10*3/uL (ref 4.0–10.5)

## 2017-10-26 MED ORDER — SODIUM CHLORIDE 0.9 % IV SOLN: 510.0000 mg | Freq: Once | INTRAVENOUS | Status: DC

## 2017-10-26 MED ORDER — PROCHLORPERAZINE MALEATE 10 MG PO TABS
10.0000 mg | ORAL_TABLET | Freq: Once | ORAL | Status: AC
  Administered 2017-10-26: 10 mg via ORAL
  Filled 2017-10-26: qty 1

## 2017-10-26 MED ORDER — HEPARIN SOD (PORK) LOCK FLUSH 100 UNIT/ML IV SOLN
500.0000 [IU] | Freq: Once | INTRAVENOUS | Status: AC | PRN
  Administered 2017-10-26: 500 [IU]
  Filled 2017-10-26: qty 5

## 2017-10-26 MED ORDER — TOPOTECAN HCL CHEMO INJECTION 4 MG
1.2000 mg/m2 | Freq: Once | INTRAVENOUS | Status: AC
  Administered 2017-10-26: 2.3 mg via INTRAVENOUS
  Filled 2017-10-26: qty 2.3

## 2017-10-26 MED ORDER — SODIUM CHLORIDE 0.9 % IV SOLN
Freq: Once | INTRAVENOUS | Status: AC
  Administered 2017-10-26: 13:00:00 via INTRAVENOUS

## 2017-10-26 MED ORDER — SODIUM CHLORIDE 0.9% FLUSH
10.0000 mL | INTRAVENOUS | Status: DC | PRN
  Administered 2017-10-26: 10 mL
  Filled 2017-10-26: qty 10

## 2017-10-26 MED ORDER — DEXAMETHASONE SODIUM PHOSPHATE 10 MG/ML IJ SOLN
4.0000 mg | Freq: Once | INTRAMUSCULAR | Status: AC
  Administered 2017-10-26: 4 mg via INTRAVENOUS
  Filled 2017-10-26: qty 1

## 2017-10-26 MED ORDER — SODIUM CHLORIDE 0.9 % IV SOLN: 4.0000 mg | Freq: Once | INTRAVENOUS | Status: DC

## 2017-10-26 NOTE — Patient Instructions (Signed)
[facility] Discharge Instructions for Patients Receiving Chemotherapy   Beginning January 23rd 2017 lab work for the [facility] will be done in the  Main lab at [facility] on 1st floor. If you have a lab appointment with the [facility] please come in thru the  Main Entrance and check in at the main information desk   Today you received the following chemotherapy agents Topotecan. Follow-up as scheduled. Call clinic for any questions or concerns  To help prevent nausea and vomiting after your treatment, we encourage you to take your nausea medication   If you develop nausea and vomiting, or diarrhea that is not controlled by your medication, call the clinic.  The clinic phone number is [phone]. Office hours are Monday-Friday 8:30am-5:00pm.  BELOW ARE SYMPTOMS THAT SHOULD BE REPORTED IMMEDIATELY:  *FEVER GREATER THAN 101.0 F  *CHILLS WITH OR WITHOUT FEVER  NAUSEA AND VOMITING THAT IS NOT CONTROLLED WITH YOUR NAUSEA MEDICATION  *UNUSUAL SHORTNESS OF BREATH  *UNUSUAL BRUISING OR BLEEDING  TENDERNESS IN MOUTH AND THROAT WITH OR WITHOUT PRESENCE OF ULCERS  *URINARY PROBLEMS  *BOWEL PROBLEMS  UNUSUAL RASH Items with * indicate a potential emergency and should be followed up as soon as possible. If you have an emergency after office hours please contact your primary care physician or go to the nearest emergency department.  Please call the clinic during office hours if you have any questions or concerns.   You may also contact the Patient Navigator at [phone] should you have any questions or need assistance in obtaining follow up care.      Resources For Cancer Patients and their Caregivers ? American Cancer Society: Can assist with transportation, wigs, general needs, runs Look Good Feel Better.        317-045-9847 ? Cancer Care: Provides financial assistance, online support groups, medication/co-pay assistance.  1-800-813-HOPE  307-521-7538) ? [facility] Assists [place] cancer patients and their families through emotional , educational and financial support.  (717) 858-9281 ? [place] DSS Where to apply for food stamps, Medicaid and utility assistance. 212-527-1458 ? RCATS: Transportation to medical appointments. 310-067-2112 ? Social Security Administration: May apply for disability if have a Stage IV cancer. 906-620-5677 617-274-2047 ? [employer], Disability and Transit Services: Assists with nutrition, care and transit needs. 757-451-2395

## 2017-10-26 NOTE — Progress Notes (Signed)
1225 Labs reviewed with and pt seen by Dr. Delton Coombes and pt approved for chemo tx today per MD                                                                                  [name] tolerated Topotecan infusion well without complaints or incident. VSS upon discharge. Pt discharged with portacath saline locked ,flushed, and left accessed for use tomorrow. Pt discharged self ambulatory in satisfactory condition

## 2017-10-26 NOTE — Progress Notes (Signed)
[place], [place] [address]   CLINIC:  Medical Oncology/Hematology  PCP:  [name], MD [address] [facility] [place] [address] [phone]   REASON FOR VISIT:  Follow-up for tensor stage small cell lung cancer.  CURRENT THERAPY: Topotecan every 3 weeks.  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 Bulky 5.4 cm right supraclavicular region malignant lymph node conglomeration with extracapsular extension. 2. Surrounding smaller abnormal right level 3 and level 5 lymph nodes, and the lymphadenopathy continues into the superior mediastinum, see Chest CT findings reported separately. 3. No other metastatic disease identified in the neck.      01/16/2017 Imaging    CT chest: IMPRESSION: 1. Extensive lymphadenopathy in the thorax and lower right cervical region, as discussed above. Primary differential considerations include lymphoma/leukemia or small cell carcinoma of the lung. Further evaluation a PET-CT could be considered to assess for additional sites of disease below the diaphragm if clinically appropriate. Additionally, ultrasound-guided biopsy of supraclavicular lymphadenopathy could be considered to establish a tissue diagnosis. 2. Indeterminate lesion in the periphery of segment 8 of the liver measuring 2.7 x 1.7 cm. Attention at time of follow-up PET-CT is recommended. 3. Aortic atherosclerosis, in addition to left main and 3 vessel coronary artery disease. Please note that although the presence of coronary artery calcium documents the presence of coronary artery disease, the severity of this disease and any potential stenosis cannot be assessed on this non-gated CT examination. Assessment for potential risk factor modification, dietary therapy or pharmacologic therapy may be warranted, if clinically indicated. 4. There are  calcifications of the aortic valve. Echocardiographic correlation for evaluation of potential valvular dysfunction may be warranted if clinically indicated. 5. Diffuse bronchial wall thickening with moderate centrilobular and paraseptal emphysema; imaging findings suggestive of underlying COPD.      02/03/2017 Initial Biopsy    (R) neck lymph node biopsy: SMALL CELL CARCINOMA (most likely lung primary).       02/03/2017 Miscellaneous    Port-a-cath attempted by IR; unable to place d/t enlarged SVC.       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 Staging for metastatic disease  EXAM: MRI HEAD WITHOUT AND WITH CONTRAST  TECHNIQUE: Multiplanar, multiecho pulse sequences of the brain and surrounding structures were obtained without and with intravenous contrast.  CONTRAST:  21mL MULTIHANCE GADOBENATE DIMEGLUMINE 529 MG/ML IV SOLN  COMPARISON:  None.  FINDINGS: Brain: Negative for hydrocephalus. Cerebral volume normal for age. Small nonenhancing white matter hyperintensities consistent with mild chronic microvascular ischemia. No acute infarct. Negative for hemorrhage or mass or edema  Normal enhancement postcontrast infusion. No enhancing mass lesion. Leptomeningeal enhancement is normal.  Vascular: Normal arterial flow voids.  Normal venous enhancement  Skull and upper cervical spine: Negative  Sinuses/Orbits: Negative  Other: None  IMPRESSION: Negative for metastatic disease.  No acute abnormality.  Mild chronic white matter changes.      04/07/2017 Imaging     PET:  1. Marked reduction in size and metabolic activity of bulky RIGHT supraclavicular adenopathy mediastinal lymphadenopathy. 2. Residual moderate activity remains  within  small RIGHT supraclavicular lymph node, RIGHT lower paratracheal lymph node and RIGHT hilar lymph node. 3. Resolution of prevascular and internal mammary mediastinal metastatic hypermetabolic activity. 4. Resolution of metabolic activity associated with solitary RIGHT hepatic lobe liver metastasis. 5. No evidence of disease progression. 6. 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      05/27/2017 Imaging    MRI brain w/ and w/o contrast IMPRESSION: 1. No metastatic disease identified. 2. Increased nonspecific cerebral white matter signal changes since August. These are most commonly small vessel disease related. 3. New right maxillary sinusitis. Benign appearing retention cysts in the nasopharynx with trace mastoid effusions.      06/12/2017 Imaging    PET-CT IMPRESSION: 1. There are two new hypermetabolic nodules identified within both lower lobes measuring up to 3.1 cm. The appearance is nonspecific and may be inflammatory/infectious in etiology. Pulmonary metastatic disease cannot be excluded and short-term follow-up imaging in 3 months is advised to reassess these nodules. 2. Stable appearance of mild hypermetabolic activity associated with right paratracheal and right hilar lymph nodes. 3. Decrease in FDG uptake associated with index right supraclavicular lymph node. 4. 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 5. Aortic Atherosclerosis (ICD10-I70.0) and Emphysema (ICD10-J43.9).        CANCER STAGING: Cancer Staging No matching staging information was found for the patient.   INTERVAL HISTORY:  Mr. [name] 67 y.o. male returns for routine follow-up and consideration for next cycle of chemotherapy.   He has done 3 cycles of topotecan so far.  He has CT scans done few days  ago.  He is complaining of decreased energy and decreased appetite.  He has occasional nausea and vomiting.  He takes Compazine and Zofran in the morning and in the afternoon.  Nausea and vomiting is more pronounced in the first 2 weeks.  He got some control with the Compazine and Zofran.  Tiredness is present throughout 3 weeks.  Denies any fevers or infections.  His neuropathy has been stable.  Still has some trouble swallowing solids.  Is drinking about 2 cans of boost per day.  REVIEW OF SYSTEMS:  Review of Systems  Constitutional: Positive for fatigue.  HENT:  Negative.   Respiratory: Negative.   Cardiovascular: Negative.   Gastrointestinal: Positive for nausea and vomiting.  Genitourinary: Negative.    Musculoskeletal: Negative.   Skin: Negative.   Neurological: Positive for numbness.  Hematological: Negative.   Psychiatric/Behavioral: Negative.      PAST MEDICAL/SURGICAL HISTORY:  Past Medical History:  Diagnosis Date  . Anxiety   . CAD (coronary artery disease)   . Cancer ([place])    stage 4 small cell lung cancer   . COPD (chronic obstructive pulmonary disease) ([place])   . Depression   . Dyspnea    increased exertion  . Feeling of chest tightness   . Heart palpitations   . History of chemotherapy   . Myocardial infarction ([place])   . Osteopenia   . Panic attacks   . Smoker    Past Surgical History:  Procedure Laterality Date  . BACK SURGERY  12/24/2000   L5,S1  . CORONARY STENT PLACEMENT  2005   RCA & CX  . HERNIA REPAIR Right 1980's  . INGUINAL HERNIA REPAIR  12/1978   right side  . IR FLUORO GUIDE PORT INSERTION RIGHT  04/02/2017  .  IR US GUIDE BX ASP/DRAIN  02/03/2017  . IR US GUIDE VASC ACCESS RIGHT  04/02/2017  . NM MYOCAR PERF WALL MOTION  09/07/2009   No ischemia; EF 51%  . SHOULDER SURGERY Left 08/2010  . SPINE SURGERY  2002   L5-S1     SOCIAL HISTORY:  Social History   Socioeconomic History  . Marital status: Single    Spouse name: Not on file  .  Number of children: Not on file  . Years of education: Not on file  . Highest education level: Not on file  Occupational History  . Not on file  Social Needs  . Financial resource strain: Not on file  . Food insecurity:    Worry: Not on file    Inability: Not on file  . Transportation needs:    Medical: Not on file    Non-medical: Not on file  Tobacco Use  . Smoking status: Current Every Day Smoker    Packs/day: 1.00    Types: Cigarettes  . Smokeless tobacco: Never Used  Substance and Sexual Activity  . Alcohol use: Yes    Comment: occas  . Drug use: No  . Sexual activity: Not on file  Lifestyle  . Physical activity:    Days per week: Not on file    Minutes per session: Not on file  . Stress: Not on file  Relationships  . Social connections:    Talks on phone: Not on file    Gets together: Not on file    Attends religious service: Not on file    Active member of club or organization: Not on file    Attends meetings of clubs or organizations: Not on file    Relationship status: Not on file  . Intimate partner violence:    Fear of current or ex partner: Not on file    Emotionally abused: Not on file    Physically abused: Not on file    Forced sexual activity: Not on file  Other Topics Concern  . Not on file  Social History Narrative  . Not on file    FAMILY HISTORY:  Family History  Problem Relation Age of Onset  . Heart attack Father   . Kidney disease Father        renal failure  . Heart failure Mother   . Heart attack Mother   . Cancer Brother   . Diabetes Brother   . Alcohol abuse Brother   . Diabetes Sister     CURRENT MEDICATIONS:  Outpatient Encounter Medications as of 10/26/2017  Medication Sig  . ALPRAZolam (XANAX) 1 MG tablet TAKE (1) TABLET BY MOUTH (4) TIMES DAILY.  Marland Kitchen atorvastatin (LIPITOR) 10 MG tablet TAKE 1 TABLET AT BEDTIME  (DISCONTINUE ATORVASTATIN 20MG )  . clotrimazole-betamethasone (LOTRISONE) cream Apply 1 application topically 2  (two) times daily.  Marland Kitchen dexamethasone (DECADRON) 4 MG tablet   . gabapentin (NEURONTIN) 300 MG capsule Take 1 capsule (300 mg total) by mouth at bedtime.  Marland Kitchen HYDROcodone-acetaminophen (NORCO) 5-325 MG tablet Take 1 tablet by mouth every 6 (six) hours as needed for moderate pain.  Marland Kitchen lidocaine-prilocaine (EMLA) cream Apply to affected area once  . ondansetron (ZOFRAN) 8 MG tablet Take 1 tablet (8 mg total) by mouth 2 (two) times daily as needed. Start on the third day after cisplatin chemotherapy.  . Pegfilgrastim (NEULASTA ONPRO [place]) Inject into the skin. Every 21 days  . polyethylene glycol powder (GLYCOLAX/MIRALAX) powder Take 17 g by mouth daily.  Marland Kitchen  prochlorperazine (COMPAZINE) 10 MG tablet Take 1 tablet (10 mg total) by mouth every 6 (six) hours as needed (Nausea or vomiting).  . topotecan in sodium chloride 0.9 % 100 mL Inject into the vein once. Days 1-5 every 21 days  . traZODone (DESYREL) 100 MG tablet Take 400 mg by mouth at bedtime.  . [DISCONTINUED] cyclobenzaprine (FLEXERIL) 5 MG tablet Take 1 tablet (5 mg total) 3 (three) times daily as needed by mouth for muscle spasms.  . [DISCONTINUED] cyclobenzaprine (FLEXERIL) 5 MG tablet TAKE 1 TABLET THREE TIMES DAILY AS NEEDED FOR MUSCLE SPASMS.   Facility-Administered Encounter Medications as of 10/26/2017  Medication  . [START ON 10/29/2017] ferumoxytol (FERAHEME) 510 mg in sodium chloride 0.9 % 100 mL IVPB    ALLERGIES:  Allergies  Allergen Reactions  . Codeine Nausea Only  . Niaspan [Niacin Er]      PHYSICAL EXAM:  ECOG Performance status: 1  I have reviewed vitals.   LABORATORY DATA:  I have reviewed the labs as listed.  CBC    Component Value Date/Time   WBC 8.1 10/26/2017 1136   RBC 2.82 (L) 10/26/2017 1136   HGB 8.9 (L) 10/26/2017 1136   HCT 27.2 (L) 10/26/2017 1136   PLT 461 (H) 10/26/2017 1136   MCV 96.5 10/26/2017 1136   MCH 31.6 10/26/2017 1136   MCHC 32.7 10/26/2017 1136   RDW 23.0 (H) 10/26/2017 1136    LYMPHSABS 1.0 10/26/2017 1136   MONOABS 0.6 10/26/2017 1136   EOSABS 0.1 10/26/2017 1136   BASOSABS 0.0 10/26/2017 1136   CMP Latest Ref Rng & Units 10/26/2017 10/05/2017 09/14/2017  Glucose 65 - 99 mg/dL 108(H) 113(H) 127(H)  BUN 6 - 20 mg/dL 17 19 13   Creatinine 0.61 - 1.24 mg/dL 0.81 0.86 0.84  Sodium 135 - 145 mmol/L 135 136 137  Potassium 3.5 - 5.1 mmol/L 3.9 4.2 4.1  Chloride 101 - 111 mmol/L 103 104 105  CO2 22 - 32 mmol/L 25 24 24   Calcium 8.9 - 10.3 mg/dL 8.8(L) 8.4(L) 8.8(L)  Total Protein 6.5 - 8.1 g/dL 6.5 6.0(L) 6.0(L)  Total Bilirubin 0.3 - 1.2 mg/dL 0.6 0.5 0.2(L)  Alkaline Phos 38 - 126 U/L 61 68 65  AST 15 - 41 U/L 15 15 20   ALT 17 - 63 U/L 13(L) 10(L) 13(L)       DIAGNOSTIC IMAGING:  I have independently reviewed CT scan of the chest, abdomen and pelvis and compared to the PET scan.  The lung nodules and adenopathy has gotten smaller.     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  Small cell lung cancer with liver metastasis: - Status post 6 cycles of cisplatin and VP-16 completed on 05/25/2017 - PET/CT scan on 08/13/2017 showing progression with bilateral lung nodules and mediastinal adenopathy -Topotecan (1.5 mg/m square) for 5 days every 21 days started on 08/24/2017, cycle 2 on 09/14/2017, cycle 3 on 10/05/2017 -CT scan of the chest abdomen and pelvis after 3 cycles showing decrease in size of the lung nodules and mediastinal adenopathy, MRI of the brain pending in [place] on 10/28/2017 - Because of his decrease energy and decreased appetite, I plan to cut back on the Topotecan dose by 20%.  We will see him back in 3 weeks for follow-up.  2.  Normocytic anemia: This is chemotherapy-induced.  His hemoglobin prior to start of Topotecan was 12.4.  Hemoglobin today is 8.9.  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  I have recommended at least one infusion of Feraheme.  We talked about side  effects in detail.  3.  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  He is taking Neurontin 300 mg at bedtime.  Neurontin makes him drowsy.  Hence I did not recommended to take during daytime.  4.  Nausea and vomiting: Patient has occasional nausea and vomiting at home.  He is taking 1 tablet of Compazine and 1 tablet of Zofran in the morning and in the afternoon.  Nausea and vomiting is more pronounced in the first 2 weeks.  I will add dexamethasone to the premedications.       Derek Jack, MD [place] [phone]

## 2017-10-26 NOTE — Assessment & Plan Note (Addendum)
1.  Small cell lung cancer with liver metastasis: - Status post 6 cycles of cisplatin and VP-16 completed on 05/25/2017 - PET/CT scan on 08/13/2017 showing progression with bilateral lung nodules and mediastinal adenopathy -Topotecan (1.5 mg/m square) for 5 days every 21 days started on 08/24/2017, cycle 2 on 09/14/2017, cycle 3 on 10/05/2017 -CT scan of the chest abdomen and pelvis after 3 cycles showing decrease in size of the lung nodules and mediastinal adenopathy, MRI of the brain pending in [place] on 10/28/2017 - Because of his decrease energy and decreased appetite, I plan to cut back on the Topotecan dose by 20%.  We will see him back in 3 weeks for follow-up.  2.  Normocytic anemia: This is chemotherapy-induced.  His hemoglobin prior to start of Topotecan was 12.4.  Hemoglobin today is 8.9.  Iron panel and ferritin is suggestive of anemia secondary to chronic inflammation.  I have recommended at least one infusion of Feraheme.  We talked about side effects in detail.  3.  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  He is taking Neurontin 300 mg at bedtime.  Neurontin makes him drowsy.  Hence I did not recommended to take during daytime.  4.  Nausea and vomiting: Patient has occasional nausea and vomiting at home.  He is taking 1 tablet of Compazine and 1 tablet of Zofran in the morning and in the afternoon.  Nausea and vomiting is more pronounced in the first 2 weeks.  I will add dexamethasone to the premedications.

## 2017-10-27 ENCOUNTER — Inpatient Hospital Stay (HOSPITAL_COMMUNITY): Payer: Medicare PPO

## 2017-10-27 ENCOUNTER — Encounter (HOSPITAL_COMMUNITY): Payer: Self-pay

## 2017-10-27 VITALS — BP 142/58 | HR 75 | Temp 97.9°F | Resp 18 | Wt 156.4 lb

## 2017-10-27 DIAGNOSIS — C787 Secondary malignant neoplasm of liver and intrahepatic bile duct: Secondary | ICD-10-CM | POA: Diagnosis not present

## 2017-10-27 DIAGNOSIS — Z5111 Encounter for antineoplastic chemotherapy: Secondary | ICD-10-CM | POA: Diagnosis not present

## 2017-10-27 DIAGNOSIS — C3490 Malignant neoplasm of unspecified part of unspecified bronchus or lung: Secondary | ICD-10-CM | POA: Diagnosis not present

## 2017-10-27 DIAGNOSIS — D649 Anemia, unspecified: Secondary | ICD-10-CM | POA: Diagnosis not present

## 2017-10-27 DIAGNOSIS — C770 Secondary and unspecified malignant neoplasm of lymph nodes of head, face and neck: Secondary | ICD-10-CM | POA: Diagnosis not present

## 2017-10-27 MED ORDER — SODIUM CHLORIDE 0.9% FLUSH
10.0000 mL | INTRAVENOUS | Status: DC | PRN
  Administered 2017-10-27: 10 mL
  Filled 2017-10-27: qty 10

## 2017-10-27 MED ORDER — OCTREOTIDE ACETATE 30 MG IM KIT
PACK | INTRAMUSCULAR | Status: AC
  Filled 2017-10-27: qty 1

## 2017-10-27 MED ORDER — SODIUM CHLORIDE 0.9 % IV SOLN: 4.0000 mg | Freq: Once | INTRAVENOUS | Status: DC

## 2017-10-27 MED ORDER — TOPOTECAN HCL CHEMO INJECTION 4 MG
1.2000 mg/m2 | Freq: Once | INTRAVENOUS | Status: AC
  Administered 2017-10-27: 2.3 mg via INTRAVENOUS
  Filled 2017-10-27: qty 2.3

## 2017-10-27 MED ORDER — DEXAMETHASONE SODIUM PHOSPHATE 10 MG/ML IJ SOLN
4.0000 mg | Freq: Once | INTRAMUSCULAR | Status: AC
  Administered 2017-10-27: 4 mg via INTRAVENOUS
  Filled 2017-10-27: qty 1

## 2017-10-27 MED ORDER — HEPARIN SOD (PORK) LOCK FLUSH 100 UNIT/ML IV SOLN
500.0000 [IU] | Freq: Once | INTRAVENOUS | Status: DC | PRN
  Filled 2017-10-27: qty 5

## 2017-10-27 MED ORDER — SODIUM CHLORIDE 0.9 % IV SOLN
Freq: Once | INTRAVENOUS | Status: AC
Start: 2017-10-27 — End: 2017-10-27
  Administered 2017-10-27: 11:00:00 via INTRAVENOUS

## 2017-10-27 MED ORDER — PROCHLORPERAZINE MALEATE 10 MG PO TABS
10.0000 mg | ORAL_TABLET | Freq: Once | ORAL | Status: AC
  Administered 2017-10-27: 10 mg via ORAL
  Filled 2017-10-27: qty 1

## 2017-10-27 NOTE — Patient Instructions (Signed)
[facility] Discharge Instructions for Patients Receiving Chemotherapy   Beginning January 23rd 2017 lab work for the [facility] will be done in the  Main lab at [facility] on 1st floor. If you have a lab appointment with the [facility] please come in thru the  Main Entrance and check in at the main information desk   Today you received the following chemotherapy agents Topotecan. Follow-up as scheduled. Call clinic for any questions or concerns  To help prevent nausea and vomiting after your treatment, we encourage you to take your nausea medication   If you develop nausea and vomiting, or diarrhea that is not controlled by your medication, call the clinic.  The clinic phone number is [phone]. Office hours are Monday-Friday 8:30am-5:00pm.  BELOW ARE SYMPTOMS THAT SHOULD BE REPORTED IMMEDIATELY:  *FEVER GREATER THAN 101.0 F  *CHILLS WITH OR WITHOUT FEVER  NAUSEA AND VOMITING THAT IS NOT CONTROLLED WITH YOUR NAUSEA MEDICATION  *UNUSUAL SHORTNESS OF BREATH  *UNUSUAL BRUISING OR BLEEDING  TENDERNESS IN MOUTH AND THROAT WITH OR WITHOUT PRESENCE OF ULCERS  *URINARY PROBLEMS  *BOWEL PROBLEMS  UNUSUAL RASH Items with * indicate a potential emergency and should be followed up as soon as possible. If you have an emergency after office hours please contact your primary care physician or go to the nearest emergency department.  Please call the clinic during office hours if you have any questions or concerns.   You may also contact the Patient Navigator at [phone] should you have any questions or need assistance in obtaining follow up care.      Resources For Cancer Patients and their Caregivers ? American Cancer Society: Can assist with transportation, wigs, general needs, runs Look Good Feel Better.        317-045-9847 ? Cancer Care: Provides financial assistance, online support groups, medication/co-pay assistance.  1-800-813-HOPE  307-521-7538) ? [facility] Assists [place] cancer patients and their families through emotional , educational and financial support.  (717) 858-9281 ? [place] DSS Where to apply for food stamps, Medicaid and utility assistance. 212-527-1458 ? RCATS: Transportation to medical appointments. 310-067-2112 ? Social Security Administration: May apply for disability if have a Stage IV cancer. 906-620-5677 617-274-2047 ? [employer], Disability and Transit Services: Assists with nutrition, care and transit needs. 757-451-2395

## 2017-10-27 NOTE — Progress Notes (Signed)
Harvie Junior tolerated chemo tx well without complaints or incident. VSS upon discharge. Port flushed and left accessed for use tomorrow. Pt discharged self ambulatory in satisfactory condition

## 2017-10-28 ENCOUNTER — Inpatient Hospital Stay (HOSPITAL_COMMUNITY): Payer: Medicare PPO

## 2017-10-28 VITALS — BP 118/55 | HR 72 | Temp 97.7°F | Resp 18

## 2017-10-28 DIAGNOSIS — Z5111 Encounter for antineoplastic chemotherapy: Secondary | ICD-10-CM | POA: Diagnosis not present

## 2017-10-28 DIAGNOSIS — C3490 Malignant neoplasm of unspecified part of unspecified bronchus or lung: Secondary | ICD-10-CM | POA: Diagnosis not present

## 2017-10-28 DIAGNOSIS — J392 Other diseases of pharynx: Secondary | ICD-10-CM | POA: Diagnosis not present

## 2017-10-28 DIAGNOSIS — C770 Secondary and unspecified malignant neoplasm of lymph nodes of head, face and neck: Secondary | ICD-10-CM | POA: Diagnosis not present

## 2017-10-28 DIAGNOSIS — C3411 Malignant neoplasm of upper lobe, right bronchus or lung: Secondary | ICD-10-CM | POA: Diagnosis not present

## 2017-10-28 DIAGNOSIS — C787 Secondary malignant neoplasm of liver and intrahepatic bile duct: Secondary | ICD-10-CM | POA: Diagnosis not present

## 2017-10-28 DIAGNOSIS — D649 Anemia, unspecified: Secondary | ICD-10-CM | POA: Diagnosis not present

## 2017-10-28 MED ORDER — SODIUM CHLORIDE 0.9 % IV SOLN
Freq: Once | INTRAVENOUS | Status: AC
  Administered 2017-10-28: 13:00:00 via INTRAVENOUS

## 2017-10-28 MED ORDER — DEXAMETHASONE SODIUM PHOSPHATE 10 MG/ML IJ SOLN
4.0000 mg | Freq: Once | INTRAMUSCULAR | Status: AC
  Administered 2017-10-28: 4 mg via INTRAVENOUS

## 2017-10-28 MED ORDER — SODIUM CHLORIDE 0.9 % IV SOLN: 4.0000 mg | Freq: Once | INTRAVENOUS | Status: DC

## 2017-10-28 MED ORDER — SODIUM CHLORIDE 0.9 % IV SOLN
1.2000 mg/m2 | Freq: Once | INTRAVENOUS | Status: AC
  Administered 2017-10-28: 2.3 mg via INTRAVENOUS
  Filled 2017-10-28: qty 2.3

## 2017-10-28 MED ORDER — PROCHLORPERAZINE MALEATE 10 MG PO TABS
10.0000 mg | ORAL_TABLET | Freq: Once | ORAL | Status: AC
  Administered 2017-10-28: 10 mg via ORAL

## 2017-10-28 MED ORDER — SODIUM CHLORIDE 0.9% FLUSH
10.0000 mL | INTRAVENOUS | Status: DC | PRN
  Administered 2017-10-28: 10 mL
  Filled 2017-10-28: qty 10

## 2017-10-28 MED ORDER — PROCHLORPERAZINE MALEATE 10 MG PO TABS
ORAL_TABLET | ORAL | Status: AC
  Filled 2017-10-28: qty 1

## 2017-10-28 MED ORDER — DEXAMETHASONE SODIUM PHOSPHATE 10 MG/ML IJ SOLN
INTRAMUSCULAR | Status: AC
  Filled 2017-10-28: qty 1

## 2017-10-28 NOTE — Patient Instructions (Signed)
[facility] Discharge Instructions for Patients Receiving Chemotherapy   Beginning January 23rd 2017 lab work for the Cancer Center will be done in the  Main lab at [facility] on 1st floor. If you have a lab appointment with the Cancer Center please come in thru the  Main Entrance and check in at the main information desk   Today you received the following chemotherapy agents   To help prevent nausea and vomiting after your treatment, we encourage you to take your nausea medication     If you develop nausea and vomiting, or diarrhea that is not controlled by your medication, call the clinic.  The clinic phone number is [phone]. Office hours are Monday-Friday 8:30am-5:00pm.  BELOW ARE SYMPTOMS THAT SHOULD BE REPORTED IMMEDIATELY:  *FEVER GREATER THAN 101.0 F  *CHILLS WITH OR WITHOUT FEVER  NAUSEA AND VOMITING THAT IS NOT CONTROLLED WITH YOUR NAUSEA MEDICATION  *UNUSUAL SHORTNESS OF BREATH  *UNUSUAL BRUISING OR BLEEDING  TENDERNESS IN MOUTH AND THROAT WITH OR WITHOUT PRESENCE OF ULCERS  *URINARY PROBLEMS  *BOWEL PROBLEMS  UNUSUAL RASH Items with * indicate a potential emergency and should be followed up as soon as possible. If you have an emergency after office hours please contact your primary care physician or go to the nearest emergency department.  Please call the clinic during office hours if you have any questions or concerns.   You may also contact the Patient Navigator at [phone] should you have any questions or need assistance in obtaining follow up care.      Resources For Cancer Patients and their Caregivers ? American Cancer Society: Can assist with transportation, wigs, general needs, runs Look Good Feel Better.        1-888-227-6333 ? Cancer Care: Provides financial assistance, online support groups, medication/co-pay assistance.  1-800-813-HOPE (4673) ? [facility] Assists [place] cancer  patients and their families through emotional , educational and financial support.  336-427-4357 ? [place] DSS Where to apply for food stamps, Medicaid and utility assistance. 336-342-1394 ? RCATS: Transportation to medical appointments. 336-347-2287 ? Social Security Administration: May apply for disability if have a Stage IV cancer. 336-342-7796 1-800-772-1213 ? [place] Aging, Disability and Transit Services: Assists with nutrition, care and transit needs. 336-349-2343         

## 2017-10-29 ENCOUNTER — Inpatient Hospital Stay (HOSPITAL_COMMUNITY): Payer: Medicare PPO

## 2017-10-29 ENCOUNTER — Encounter (HOSPITAL_COMMUNITY): Payer: Self-pay

## 2017-10-29 VITALS — BP 151/62 | HR 65 | Temp 97.6°F | Resp 18

## 2017-10-29 DIAGNOSIS — C787 Secondary malignant neoplasm of liver and intrahepatic bile duct: Secondary | ICD-10-CM | POA: Diagnosis not present

## 2017-10-29 DIAGNOSIS — D649 Anemia, unspecified: Secondary | ICD-10-CM | POA: Diagnosis not present

## 2017-10-29 DIAGNOSIS — C3490 Malignant neoplasm of unspecified part of unspecified bronchus or lung: Secondary | ICD-10-CM | POA: Diagnosis not present

## 2017-10-29 DIAGNOSIS — Z5111 Encounter for antineoplastic chemotherapy: Secondary | ICD-10-CM | POA: Diagnosis not present

## 2017-10-29 DIAGNOSIS — C770 Secondary and unspecified malignant neoplasm of lymph nodes of head, face and neck: Secondary | ICD-10-CM | POA: Diagnosis not present

## 2017-10-29 MED ORDER — SODIUM CHLORIDE 0.9 % IV SOLN
Freq: Once | INTRAVENOUS | Status: AC
  Administered 2017-10-29: 500 mL via INTRAVENOUS

## 2017-10-29 MED ORDER — PROCHLORPERAZINE MALEATE 10 MG PO TABS
10.0000 mg | ORAL_TABLET | Freq: Once | ORAL | Status: AC
  Administered 2017-10-29: 10 mg via ORAL

## 2017-10-29 MED ORDER — HEPARIN SOD (PORK) LOCK FLUSH 100 UNIT/ML IV SOLN
500.0000 [IU] | Freq: Once | INTRAVENOUS | Status: AC | PRN
  Administered 2017-10-29: 500 [IU]

## 2017-10-29 MED ORDER — DEXAMETHASONE SODIUM PHOSPHATE 10 MG/ML IJ SOLN
4.0000 mg | Freq: Once | INTRAMUSCULAR | Status: AC
  Administered 2017-10-29: 4 mg via INTRAVENOUS

## 2017-10-29 MED ORDER — HEPARIN SOD (PORK) LOCK FLUSH 100 UNIT/ML IV SOLN
INTRAVENOUS | Status: AC
  Filled 2017-10-29: qty 5

## 2017-10-29 MED ORDER — PROCHLORPERAZINE MALEATE 10 MG PO TABS
ORAL_TABLET | ORAL | Status: AC
  Filled 2017-10-29: qty 1

## 2017-10-29 MED ORDER — SODIUM CHLORIDE 0.9 % IV SOLN
1.2000 mg/m2 | Freq: Once | INTRAVENOUS | Status: AC
  Administered 2017-10-29: 2.3 mg via INTRAVENOUS
  Filled 2017-10-29: qty 2.3

## 2017-10-29 MED ORDER — SODIUM CHLORIDE 0.9% FLUSH
10.0000 mL | INTRAVENOUS | Status: DC | PRN
  Administered 2017-10-29: 10 mL
  Filled 2017-10-29: qty 10

## 2017-10-29 MED ORDER — DEXAMETHASONE SODIUM PHOSPHATE 10 MG/ML IJ SOLN
INTRAMUSCULAR | Status: AC
  Filled 2017-10-29: qty 1

## 2017-10-29 MED ORDER — SODIUM CHLORIDE 0.9 % IV SOLN
510.0000 mg | Freq: Once | INTRAVENOUS | Status: AC
  Administered 2017-10-29: 510 mg via INTRAVENOUS
  Filled 2017-10-29: qty 17

## 2017-10-29 MED ORDER — SODIUM CHLORIDE 0.9 % IV SOLN: 4.0000 mg | Freq: Once | INTRAVENOUS | Status: DC

## 2017-10-29 NOTE — Patient Instructions (Signed)
[facility] Discharge Instructions for Patients Receiving Chemotherapy  Today you received the following chemotherapy agents topetecan and iron infusion   If you develop nausea and vomiting that is not controlled by your nausea medication, call the clinic.   BELOW ARE SYMPTOMS THAT SHOULD BE REPORTED IMMEDIATELY:  *FEVER GREATER THAN 100.5 F  *CHILLS WITH OR WITHOUT FEVER  NAUSEA AND VOMITING THAT IS NOT CONTROLLED WITH YOUR NAUSEA MEDICATION  *UNUSUAL SHORTNESS OF BREATH  *UNUSUAL BRUISING OR BLEEDING  TENDERNESS IN MOUTH AND THROAT WITH OR WITHOUT PRESENCE OF ULCERS  *URINARY PROBLEMS  *BOWEL PROBLEMS  UNUSUAL RASH Items with * indicate a potential emergency and should be followed up as soon as possible.  Feel free to call the clinic should you have any questions or concerns. The clinic phone number is [phone].  Please show the [facility] at check-in to the Emergency Department and triage nurse.

## 2017-10-29 NOTE — Progress Notes (Signed)
To treatment area for chemotherapy.  Good blood return noted with port flush.  No complaints of pain with flush. No change in neuropathy.  Prn constipation and using daily OTC medications to stay regular.   Patient tolerated chemotherapy and iron infusion with no complaints voiced.  Port site clean and dry with no bruising or swelling noted at site.  Good blood return noted before and after administration of treatment and iron infusion.  Band aid applied.  VSS with discharge and left ambulatory with no s/s of distress noted.    Patient to return Monday for Day 5 and does not need labs per Dr. Delton Coombes unless the patient feels bad or looks bad.  Reviewed with the patient and understanding verbalized.

## 2017-11-02 ENCOUNTER — Inpatient Hospital Stay (HOSPITAL_COMMUNITY): Payer: Medicare PPO

## 2017-11-02 ENCOUNTER — Other Ambulatory Visit: Payer: Self-pay

## 2017-11-02 ENCOUNTER — Encounter (HOSPITAL_COMMUNITY): Payer: Self-pay

## 2017-11-02 VITALS — BP 128/53 | HR 77 | Temp 98.7°F | Resp 16 | Wt 155.0 lb

## 2017-11-02 DIAGNOSIS — C770 Secondary and unspecified malignant neoplasm of lymph nodes of head, face and neck: Secondary | ICD-10-CM | POA: Diagnosis not present

## 2017-11-02 DIAGNOSIS — C787 Secondary malignant neoplasm of liver and intrahepatic bile duct: Secondary | ICD-10-CM | POA: Diagnosis not present

## 2017-11-02 DIAGNOSIS — Z5111 Encounter for antineoplastic chemotherapy: Secondary | ICD-10-CM | POA: Diagnosis not present

## 2017-11-02 DIAGNOSIS — D649 Anemia, unspecified: Secondary | ICD-10-CM | POA: Diagnosis not present

## 2017-11-02 DIAGNOSIS — C3490 Malignant neoplasm of unspecified part of unspecified bronchus or lung: Secondary | ICD-10-CM | POA: Diagnosis not present

## 2017-11-02 MED ORDER — PEGFILGRASTIM 6 MG/0.6ML ~~LOC~~ PSKT
6.0000 mg | PREFILLED_SYRINGE | Freq: Once | SUBCUTANEOUS | Status: AC
  Administered 2017-11-02: 6 mg via SUBCUTANEOUS

## 2017-11-02 MED ORDER — HEPARIN SOD (PORK) LOCK FLUSH 100 UNIT/ML IV SOLN
500.0000 [IU] | Freq: Once | INTRAVENOUS | Status: AC | PRN
  Administered 2017-11-02: 500 [IU]

## 2017-11-02 MED ORDER — SODIUM CHLORIDE 0.9 % IV SOLN: 4.0000 mg | Freq: Once | INTRAVENOUS | Status: DC

## 2017-11-02 MED ORDER — TOPOTECAN HCL CHEMO INJECTION 4 MG
1.2000 mg/m2 | Freq: Once | INTRAVENOUS | Status: AC
  Administered 2017-11-02: 2.3 mg via INTRAVENOUS
  Filled 2017-11-02: qty 2.3

## 2017-11-02 MED ORDER — DEXAMETHASONE SODIUM PHOSPHATE 10 MG/ML IJ SOLN
4.0000 mg | Freq: Once | INTRAMUSCULAR | Status: AC
  Administered 2017-11-02: 4 mg via INTRAVENOUS

## 2017-11-02 MED ORDER — SODIUM CHLORIDE 0.9 % IV SOLN
Freq: Once | INTRAVENOUS | Status: AC
  Administered 2017-11-02: 11:00:00 via INTRAVENOUS

## 2017-11-02 MED ORDER — PROCHLORPERAZINE MALEATE 10 MG PO TABS
10.0000 mg | ORAL_TABLET | Freq: Once | ORAL | Status: AC
  Administered 2017-11-02: 10 mg via ORAL

## 2017-11-02 NOTE — Progress Notes (Signed)
Tolerated infusion w/o adverse reaction.  Alert, in no distress.  VSS.  Discharged ambulatory.  

## 2017-11-03 DIAGNOSIS — C3490 Malignant neoplasm of unspecified part of unspecified bronchus or lung: Secondary | ICD-10-CM | POA: Diagnosis not present

## 2017-11-13 ENCOUNTER — Other Ambulatory Visit: Payer: Self-pay | Admitting: Family Medicine

## 2017-11-13 NOTE — Telephone Encounter (Signed)
Requesting refill      LOV: 05/19/17  LRF:  10/16/17 on Xanax and 09/17/17 on Flexeril

## 2017-11-16 ENCOUNTER — Encounter (HOSPITAL_COMMUNITY): Payer: Self-pay

## 2017-11-16 ENCOUNTER — Inpatient Hospital Stay (HOSPITAL_COMMUNITY): Payer: Medicare PPO | Attending: Oncology

## 2017-11-16 ENCOUNTER — Other Ambulatory Visit: Payer: Self-pay

## 2017-11-16 ENCOUNTER — Ambulatory Visit (HOSPITAL_COMMUNITY): Payer: Medicare PPO | Admitting: Hematology

## 2017-11-16 VITALS — BP 132/54 | HR 68 | Temp 97.0°F | Resp 16 | Wt 152.2 lb

## 2017-11-16 DIAGNOSIS — C3490 Malignant neoplasm of unspecified part of unspecified bronchus or lung: Secondary | ICD-10-CM | POA: Insufficient documentation

## 2017-11-16 DIAGNOSIS — C787 Secondary malignant neoplasm of liver and intrahepatic bile duct: Secondary | ICD-10-CM | POA: Diagnosis not present

## 2017-11-16 DIAGNOSIS — Z5111 Encounter for antineoplastic chemotherapy: Secondary | ICD-10-CM | POA: Diagnosis not present

## 2017-11-16 DIAGNOSIS — C770 Secondary and unspecified malignant neoplasm of lymph nodes of head, face and neck: Secondary | ICD-10-CM | POA: Diagnosis not present

## 2017-11-16 LAB — COMPREHENSIVE METABOLIC PANEL
ALT: 11 U/L — ABNORMAL LOW (ref 17–63)
AST: 15 U/L (ref 15–41)
Albumin: 3.8 g/dL (ref 3.5–5.0)
Alkaline Phosphatase: 75 U/L (ref 38–126)
Anion gap: 8 (ref 5–15)
BUN: 20 mg/dL (ref 6–20)
CO2: 24 mmol/L (ref 22–32)
Calcium: 9 mg/dL (ref 8.9–10.3)
Chloride: 105 mmol/L (ref 101–111)
Creatinine, Ser: 0.88 mg/dL (ref 0.61–1.24)
GFR calc Af Amer: >60 mL/min (ref >60)
GFR calc non Af Amer: >60 mL/min (ref >60)
Glucose, Bld: 113 mg/dL — ABNORMAL HIGH (ref 65–99)
Potassium: 3.6 mmol/L (ref 3.5–5.1)
Sodium: 137 mmol/L (ref 135–145)
Total Bilirubin: 0.5 mg/dL (ref 0.3–1.2)
Total Protein: 6.6 g/dL (ref 6.5–8.1)

## 2017-11-16 LAB — CBC WITH DIFFERENTIAL/PLATELET
Basophils Absolute: 0 10*3/uL (ref 0.0–0.1)
Basophils Relative: 0 %
Eosinophils Absolute: 0 10*3/uL (ref 0.0–0.7)
Eosinophils Relative: 0 %
HCT: 25.7 % — ABNORMAL LOW (ref 39.0–52.0)
Hemoglobin: 8.5 g/dL — ABNORMAL LOW (ref 13.0–17.0)
Lymphocytes Relative: 5 %
Lymphs Abs: 0.7 10*3/uL (ref 0.7–4.0)
MCH: 33.6 pg (ref 26.0–34.0)
MCHC: 33.1 g/dL (ref 30.0–36.0)
MCV: 101.6 fL — ABNORMAL HIGH (ref 78.0–100.0)
Monocytes Absolute: 0.6 10*3/uL (ref 0.1–1.0)
Monocytes Relative: 4 %
Neutro Abs: 12.7 10*3/uL — ABNORMAL HIGH (ref 1.7–7.7)
Neutrophils Relative %: 91 %
Platelets: 307 10*3/uL (ref 150–400)
RBC: 2.53 MIL/uL — ABNORMAL LOW (ref 4.22–5.81)
RDW: 25.8 % — ABNORMAL HIGH (ref 11.5–15.5)
WBC: 14 10*3/uL — ABNORMAL HIGH (ref 4.0–10.5)

## 2017-11-16 MED ORDER — SODIUM CHLORIDE 0.9% FLUSH
10.0000 mL | INTRAVENOUS | Status: DC | PRN
  Administered 2017-11-16: 10 mL
  Filled 2017-11-16: qty 10

## 2017-11-16 MED ORDER — SODIUM CHLORIDE 0.9 % IV SOLN
8.0000 mg | Freq: Once | INTRAVENOUS | Status: AC
  Administered 2017-11-16: 8 mg via INTRAVENOUS
  Filled 2017-11-16: qty 4

## 2017-11-16 MED ORDER — TOPOTECAN HCL CHEMO INJECTION 4 MG
1.2000 mg/m2 | Freq: Once | INTRAVENOUS | Status: AC
  Administered 2017-11-16: 2.3 mg via INTRAVENOUS
  Filled 2017-11-16: qty 2.3

## 2017-11-16 MED ORDER — SODIUM CHLORIDE 0.9 % IV SOLN
INTRAVENOUS | Status: DC
  Administered 2017-11-16: 10:00:00 via INTRAVENOUS

## 2017-11-16 MED ORDER — SODIUM CHLORIDE 0.9 % IV SOLN: 4.0000 mg | Freq: Once | INTRAVENOUS | Status: DC

## 2017-11-16 MED ORDER — DEXAMETHASONE SODIUM PHOSPHATE 10 MG/ML IJ SOLN
4.0000 mg | Freq: Once | INTRAMUSCULAR | Status: AC
  Administered 2017-11-16: 4 mg via INTRAVENOUS
  Filled 2017-11-16: qty 1

## 2017-11-16 MED ORDER — SODIUM CHLORIDE 0.9 % IV SOLN: Freq: Once | INTRAVENOUS | Status: AC

## 2017-11-16 MED ORDER — PROCHLORPERAZINE MALEATE 10 MG PO TABS
10.0000 mg | ORAL_TABLET | Freq: Once | ORAL | Status: AC
  Administered 2017-11-16: 10 mg via ORAL
  Filled 2017-11-16: qty 1

## 2017-11-16 NOTE — Patient Instructions (Signed)
[facility] Discharge Instructions for Patients Receiving Chemotherapy   Beginning January 23rd 2017 lab work for the Cancer Center will be done in the  Main lab at [facility] on 1st floor. If you have a lab appointment with the Cancer Center please come in thru the  Main Entrance and check in at the main information desk   Today you received the following chemotherapy agents   To help prevent nausea and vomiting after your treatment, we encourage you to take your nausea medication     If you develop nausea and vomiting, or diarrhea that is not controlled by your medication, call the clinic.  The clinic phone number is [phone]. Office hours are Monday-Friday 8:30am-5:00pm.  BELOW ARE SYMPTOMS THAT SHOULD BE REPORTED IMMEDIATELY:  *FEVER GREATER THAN 101.0 F  *CHILLS WITH OR WITHOUT FEVER  NAUSEA AND VOMITING THAT IS NOT CONTROLLED WITH YOUR NAUSEA MEDICATION  *UNUSUAL SHORTNESS OF BREATH  *UNUSUAL BRUISING OR BLEEDING  TENDERNESS IN MOUTH AND THROAT WITH OR WITHOUT PRESENCE OF ULCERS  *URINARY PROBLEMS  *BOWEL PROBLEMS  UNUSUAL RASH Items with * indicate a potential emergency and should be followed up as soon as possible. If you have an emergency after office hours please contact your primary care physician or go to the nearest emergency department.  Please call the clinic during office hours if you have any questions or concerns.   You may also contact the Patient Navigator at [phone] should you have any questions or need assistance in obtaining follow up care.      Resources For Cancer Patients and their Caregivers ? American Cancer Society: Can assist with transportation, wigs, general needs, runs Look Good Feel Better.        1-888-227-6333 ? Cancer Care: Provides financial assistance, online support groups, medication/co-pay assistance.  1-800-813-HOPE (4673) ? [facility] Assists [place] cancer  patients and their families through emotional , educational and financial support.  336-427-4357 ? [place] DSS Where to apply for food stamps, Medicaid and utility assistance. 336-342-1394 ? RCATS: Transportation to medical appointments. 336-347-2287 ? Social Security Administration: May apply for disability if have a Stage IV cancer. 336-342-7796 1-800-772-1213 ? [place] Aging, Disability and Transit Services: Assists with nutrition, care and transit needs. 336-349-2343         

## 2017-11-16 NOTE — Progress Notes (Signed)
Patient came in today for treatment. Patient states he is nauseated, vomiting three times since 4am this morning. He has not taken any nausea medications today. MD informed, will give fluids and zofran per MD.  1100-labs reviewed with Dr. Delton Coombes today. Patient states he feels some better since he got some fluids and the zofran. Will proceed with treatment today per MD. Vitals stable at this time.  Treatment given per orders. Patient tolerated it well without problems. Vitals stable and discharged home from clinic ambulatory. Follow up as scheduled.

## 2017-11-17 ENCOUNTER — Encounter (HOSPITAL_COMMUNITY): Payer: Self-pay

## 2017-11-17 ENCOUNTER — Inpatient Hospital Stay (HOSPITAL_COMMUNITY): Payer: Medicare PPO

## 2017-11-17 VITALS — BP 117/62 | HR 76 | Temp 97.6°F | Resp 18

## 2017-11-17 DIAGNOSIS — Z5111 Encounter for antineoplastic chemotherapy: Secondary | ICD-10-CM | POA: Diagnosis not present

## 2017-11-17 DIAGNOSIS — C787 Secondary malignant neoplasm of liver and intrahepatic bile duct: Secondary | ICD-10-CM | POA: Diagnosis not present

## 2017-11-17 DIAGNOSIS — C3490 Malignant neoplasm of unspecified part of unspecified bronchus or lung: Secondary | ICD-10-CM | POA: Diagnosis not present

## 2017-11-17 DIAGNOSIS — C770 Secondary and unspecified malignant neoplasm of lymph nodes of head, face and neck: Secondary | ICD-10-CM | POA: Diagnosis not present

## 2017-11-17 MED ORDER — SODIUM CHLORIDE 0.9 % IV SOLN
Freq: Once | INTRAVENOUS | Status: AC
  Administered 2017-11-17: 11:00:00 via INTRAVENOUS

## 2017-11-17 MED ORDER — PROCHLORPERAZINE MALEATE 10 MG PO TABS
10.0000 mg | ORAL_TABLET | Freq: Once | ORAL | Status: AC
  Administered 2017-11-17: 10 mg via ORAL
  Filled 2017-11-17: qty 1

## 2017-11-17 MED ORDER — SODIUM CHLORIDE 0.9% FLUSH
10.0000 mL | INTRAVENOUS | Status: DC | PRN
  Administered 2017-11-17: 10 mL
  Filled 2017-11-17: qty 10

## 2017-11-17 MED ORDER — DEXAMETHASONE SODIUM PHOSPHATE 10 MG/ML IJ SOLN
4.0000 mg | Freq: Once | INTRAMUSCULAR | Status: AC
  Administered 2017-11-17: 4 mg via INTRAVENOUS
  Filled 2017-11-17: qty 1

## 2017-11-17 MED ORDER — SODIUM CHLORIDE 0.9 % IV SOLN: 4.0000 mg | Freq: Once | INTRAVENOUS | Status: DC

## 2017-11-17 MED ORDER — HEPARIN SOD (PORK) LOCK FLUSH 100 UNIT/ML IV SOLN
500.0000 [IU] | Freq: Once | INTRAVENOUS | Status: DC | PRN
  Filled 2017-11-17: qty 5

## 2017-11-17 MED ORDER — SODIUM CHLORIDE 0.9 % IV SOLN
1.2000 mg/m2 | Freq: Once | INTRAVENOUS | Status: AC
  Administered 2017-11-17: 2.3 mg via INTRAVENOUS
  Filled 2017-11-17: qty 2.3

## 2017-11-17 NOTE — Patient Instructions (Signed)
[facility] Discharge Instructions for Patients Receiving Chemotherapy   Beginning January 23rd 2017 lab work for the [facility] will be done in the  Main lab at [facility] on 1st floor. If you have a lab appointment with the [facility] please come in thru the  Main Entrance and check in at the main information desk   Today you received the following chemotherapy agents Topotecan. Follow-up as scheduled. Call clinic for any questions or concerns  To help prevent nausea and vomiting after your treatment, we encourage you to take your nausea medication   If you develop nausea and vomiting, or diarrhea that is not controlled by your medication, call the clinic.  The clinic phone number is [phone]. Office hours are Monday-Friday 8:30am-5:00pm.  BELOW ARE SYMPTOMS THAT SHOULD BE REPORTED IMMEDIATELY:  *FEVER GREATER THAN 101.0 F  *CHILLS WITH OR WITHOUT FEVER  NAUSEA AND VOMITING THAT IS NOT CONTROLLED WITH YOUR NAUSEA MEDICATION  *UNUSUAL SHORTNESS OF BREATH  *UNUSUAL BRUISING OR BLEEDING  TENDERNESS IN MOUTH AND THROAT WITH OR WITHOUT PRESENCE OF ULCERS  *URINARY PROBLEMS  *BOWEL PROBLEMS  UNUSUAL RASH Items with * indicate a potential emergency and should be followed up as soon as possible. If you have an emergency after office hours please contact your primary care physician or go to the nearest emergency department.  Please call the clinic during office hours if you have any questions or concerns.   You may also contact the Patient Navigator at [phone] should you have any questions or need assistance in obtaining follow up care.      Resources For Cancer Patients and their Caregivers ? American Cancer Society: Can assist with transportation, wigs, general needs, runs Look Good Feel Better.        317-045-9847 ? Cancer Care: Provides financial assistance, online support groups, medication/co-pay assistance.  1-800-813-HOPE  307-521-7538) ? [facility] Assists [place] cancer patients and their families through emotional , educational and financial support.  (717) 858-9281 ? [place] DSS Where to apply for food stamps, Medicaid and utility assistance. 212-527-1458 ? RCATS: Transportation to medical appointments. 310-067-2112 ? Social Security Administration: May apply for disability if have a Stage IV cancer. 906-620-5677 617-274-2047 ? [employer], Disability and Transit Services: Assists with nutrition, care and transit needs. 757-451-2395

## 2017-11-17 NOTE — Progress Notes (Signed)
Harvie Junior tolerated Topotecan infusion well without complaints or incident. VSS upon discharge. Port left accessed,saline locked and flushed per protocol for use tomorrow. Pt discharged self ambulatory in satisfactory condition

## 2017-11-18 ENCOUNTER — Inpatient Hospital Stay (HOSPITAL_COMMUNITY): Payer: Medicare PPO

## 2017-11-18 ENCOUNTER — Ambulatory Visit (HOSPITAL_COMMUNITY): Payer: Medicare PPO | Admitting: Hematology

## 2017-11-18 VITALS — BP 110/47 | HR 76 | Temp 97.5°F | Resp 18 | Wt 150.6 lb

## 2017-11-18 DIAGNOSIS — C3490 Malignant neoplasm of unspecified part of unspecified bronchus or lung: Secondary | ICD-10-CM | POA: Diagnosis not present

## 2017-11-18 DIAGNOSIS — Z5111 Encounter for antineoplastic chemotherapy: Secondary | ICD-10-CM | POA: Diagnosis not present

## 2017-11-18 DIAGNOSIS — C787 Secondary malignant neoplasm of liver and intrahepatic bile duct: Secondary | ICD-10-CM | POA: Diagnosis not present

## 2017-11-18 DIAGNOSIS — C770 Secondary and unspecified malignant neoplasm of lymph nodes of head, face and neck: Secondary | ICD-10-CM | POA: Diagnosis not present

## 2017-11-18 MED ORDER — PROCHLORPERAZINE MALEATE 10 MG PO TABS: 10.0000 mg | ORAL_TABLET | Freq: Four times a day (QID) | ORAL | 1 refills | Status: AC | PRN

## 2017-11-18 MED ORDER — SODIUM CHLORIDE 0.9 % IV SOLN
Freq: Once | INTRAVENOUS | Status: AC
  Administered 2017-11-18: 12:00:00 via INTRAVENOUS

## 2017-11-18 MED ORDER — DEXAMETHASONE SODIUM PHOSPHATE 10 MG/ML IJ SOLN
4.0000 mg | Freq: Once | INTRAMUSCULAR | Status: AC
  Administered 2017-11-18: 4 mg via INTRAVENOUS
  Filled 2017-11-18: qty 1

## 2017-11-18 MED ORDER — SODIUM CHLORIDE 0.9% FLUSH
10.0000 mL | INTRAVENOUS | Status: DC | PRN
  Administered 2017-11-18: 10 mL
  Filled 2017-11-18: qty 10

## 2017-11-18 MED ORDER — TOPOTECAN HCL CHEMO INJECTION 4 MG
1.2000 mg/m2 | Freq: Once | INTRAVENOUS | Status: AC
  Administered 2017-11-18: 2.3 mg via INTRAVENOUS
  Filled 2017-11-18: qty 2.3

## 2017-11-18 MED ORDER — DEXAMETHASONE SODIUM PHOSPHATE 100 MG/10ML IJ SOLN: 4.0000 mg | Freq: Once | INTRAMUSCULAR | Status: DC

## 2017-11-18 MED ORDER — PROCHLORPERAZINE MALEATE 10 MG PO TABS
10.0000 mg | ORAL_TABLET | Freq: Once | ORAL | Status: AC
  Administered 2017-11-18: 10 mg via ORAL
  Filled 2017-11-18: qty 1

## 2017-11-19 ENCOUNTER — Inpatient Hospital Stay (HOSPITAL_COMMUNITY): Payer: Medicare PPO | Admitting: Hematology

## 2017-11-19 ENCOUNTER — Inpatient Hospital Stay (HOSPITAL_COMMUNITY): Payer: Medicare PPO

## 2017-11-19 ENCOUNTER — Encounter (HOSPITAL_COMMUNITY): Payer: Self-pay | Admitting: Adult Health

## 2017-11-19 ENCOUNTER — Other Ambulatory Visit (HOSPITAL_COMMUNITY): Payer: Self-pay | Admitting: Adult Health

## 2017-11-19 ENCOUNTER — Encounter (HOSPITAL_COMMUNITY): Payer: Self-pay

## 2017-11-19 VITALS — BP 127/56 | HR 73 | Temp 97.3°F | Resp 18 | Wt 149.6 lb

## 2017-11-19 DIAGNOSIS — Z5111 Encounter for antineoplastic chemotherapy: Secondary | ICD-10-CM | POA: Diagnosis not present

## 2017-11-19 DIAGNOSIS — C3490 Malignant neoplasm of unspecified part of unspecified bronchus or lung: Secondary | ICD-10-CM

## 2017-11-19 DIAGNOSIS — C770 Secondary and unspecified malignant neoplasm of lymph nodes of head, face and neck: Secondary | ICD-10-CM | POA: Diagnosis not present

## 2017-11-19 DIAGNOSIS — C787 Secondary malignant neoplasm of liver and intrahepatic bile duct: Secondary | ICD-10-CM | POA: Diagnosis not present

## 2017-11-19 MED ORDER — DEXAMETHASONE SODIUM PHOSPHATE 10 MG/ML IJ SOLN
INTRAMUSCULAR | Status: AC
Start: 2017-11-19 — End: ?
  Filled 2017-11-19: qty 1

## 2017-11-19 MED ORDER — SODIUM CHLORIDE 0.9 % IV SOLN
Freq: Once | INTRAVENOUS | Status: AC
  Administered 2017-11-19: 10:00:00 via INTRAVENOUS

## 2017-11-19 MED ORDER — PROCHLORPERAZINE MALEATE 10 MG PO TABS
10.0000 mg | ORAL_TABLET | Freq: Once | ORAL | Status: AC
  Administered 2017-11-19: 10 mg via ORAL

## 2017-11-19 MED ORDER — SODIUM CHLORIDE 0.9 % IV SOLN
1.2000 mg/m2 | Freq: Once | INTRAVENOUS | Status: AC
  Administered 2017-11-19: 2.3 mg via INTRAVENOUS
  Filled 2017-11-19: qty 2.3

## 2017-11-19 MED ORDER — DEXAMETHASONE SODIUM PHOSPHATE 10 MG/ML IJ SOLN
4.0000 mg | Freq: Once | INTRAMUSCULAR | Status: AC
  Administered 2017-11-19: 4 mg via INTRAVENOUS

## 2017-11-19 MED ORDER — SODIUM CHLORIDE 0.9 % IV SOLN: 4.0000 mg | Freq: Once | INTRAVENOUS | Status: DC

## 2017-11-19 MED ORDER — SODIUM CHLORIDE 0.9% FLUSH
10.0000 mL | INTRAVENOUS | Status: DC | PRN
  Administered 2017-11-19: 10 mL
  Filled 2017-11-19: qty 10

## 2017-11-19 MED ORDER — PROCHLORPERAZINE MALEATE 10 MG PO TABS
ORAL_TABLET | ORAL | Status: AC
  Filled 2017-11-19: qty 1

## 2017-11-19 NOTE — Progress Notes (Signed)
I briefly spoke with [name] today prior to his next dose of Topotecan due today.   Overall, he feels tired.  He was not feeling well earlier in the week and felt very tired. He also had car trouble and had to miss his treatment.  He tells me that he is feeling a bit better today and feels ready for his next treatment today.    He tells me he is concerned about continuing to lose weight. States that he has lost about 40 lbs since he started current Topotecan chemo regimen.  He feels like he "does not have enough time to recover in between treatments."  (meaning with chemo daily on days 1-5 every 3 weeks).  He feels like if he had a bit more time in between cycles he may be able to improve his eating and energy levels between cycles.    Discussed with Dr. Delton Coombes. He will consider changing his treatments to every 4 weeks vs every 3 weeks.  Dr. Raliegh Ip will briefly talk with the patient.    [name] is requesting refill of his hydrocodone. His pain is well-controlled on current regimen. He has occasional constipation; denies diarrhea.     He asked, "How much longer will I have to be on chemo?"  Reiterated that he has extensive stage disease and the goal is cancer control, which requires systemic therapy.     [name], NP [place] [phone]

## 2017-11-19 NOTE — Patient Instructions (Signed)
[facility] Discharge Instructions for Patients Receiving Chemotherapy  Today you received the following chemotherapy agents topotecan.   If you develop nausea and vomiting that is not controlled by your nausea medication, call the clinic.   BELOW ARE SYMPTOMS THAT SHOULD BE REPORTED IMMEDIATELY:  *FEVER GREATER THAN 100.5 F  *CHILLS WITH OR WITHOUT FEVER  NAUSEA AND VOMITING THAT IS NOT CONTROLLED WITH YOUR NAUSEA MEDICATION  *UNUSUAL SHORTNESS OF BREATH  *UNUSUAL BRUISING OR BLEEDING  TENDERNESS IN MOUTH AND THROAT WITH OR WITHOUT PRESENCE OF ULCERS  *URINARY PROBLEMS  *BOWEL PROBLEMS  UNUSUAL RASH Items with * indicate a potential emergency and should be followed up as soon as possible.  Feel free to call the clinic should you have any questions or concerns. The clinic phone number is [phone].  Please show the [facility] at check-in to the Emergency Department and triage nurse.

## 2017-11-19 NOTE — Progress Notes (Signed)
To treatment area for Day 4 of chemotherapy.  Patient stated no worsening of neuropathy but is dropping items.  He can do buttons with some difficulty, open jar lids, and use zippers.  Denied falls or stumbles.  Fair appetite with prn nausea.  Using medications as directed.    Patient tolerated treatment with no complaints voiced.  Port site clean and dry with no bruising or swelling noted at site.  Flushed easily with good blood return noted.  Dressing intact and reinforced.  VSS with discharge and left ambulatory with no s/s of distress noted.

## 2017-11-19 NOTE — Progress Notes (Signed)
He also reports some occasional dysphagia, which is worse with meats and breads.    I will ask our dietitian to see him tomorrow when he returns for next treatment.    [name], NP [place] [phone]

## 2017-11-19 NOTE — Progress Notes (Signed)
   Review of Systems - Oncology     This encounter was created in error - please disregard.

## 2017-11-20 ENCOUNTER — Inpatient Hospital Stay (HOSPITAL_COMMUNITY): Payer: Medicare PPO

## 2017-11-20 ENCOUNTER — Other Ambulatory Visit (HOSPITAL_COMMUNITY): Payer: Self-pay | Admitting: Hematology

## 2017-11-20 VITALS — BP 124/54 | HR 79 | Temp 97.6°F | Resp 16 | Wt 149.2 lb

## 2017-11-20 DIAGNOSIS — C787 Secondary malignant neoplasm of liver and intrahepatic bile duct: Secondary | ICD-10-CM | POA: Diagnosis not present

## 2017-11-20 DIAGNOSIS — C770 Secondary and unspecified malignant neoplasm of lymph nodes of head, face and neck: Secondary | ICD-10-CM | POA: Diagnosis not present

## 2017-11-20 DIAGNOSIS — C3490 Malignant neoplasm of unspecified part of unspecified bronchus or lung: Secondary | ICD-10-CM | POA: Diagnosis not present

## 2017-11-20 DIAGNOSIS — Z5111 Encounter for antineoplastic chemotherapy: Secondary | ICD-10-CM | POA: Diagnosis not present

## 2017-11-20 DIAGNOSIS — G893 Neoplasm related pain (acute) (chronic): Secondary | ICD-10-CM

## 2017-11-20 MED ORDER — PROCHLORPERAZINE MALEATE 10 MG PO TABS
ORAL_TABLET | ORAL | Status: AC
  Filled 2017-11-20: qty 1

## 2017-11-20 MED ORDER — SODIUM CHLORIDE 0.9 % IV SOLN
Freq: Once | INTRAVENOUS | Status: AC
  Administered 2017-11-20: 11:00:00 via INTRAVENOUS

## 2017-11-20 MED ORDER — PEGFILGRASTIM 6 MG/0.6ML ~~LOC~~ PSKT
6.0000 mg | PREFILLED_SYRINGE | Freq: Once | SUBCUTANEOUS | Status: AC
  Administered 2017-11-20: 6 mg via SUBCUTANEOUS
  Filled 2017-11-20: qty 0.6

## 2017-11-20 MED ORDER — HYDROCODONE-ACETAMINOPHEN 5-325 MG PO TABS: 1.0000 | ORAL_TABLET | Freq: Four times a day (QID) | ORAL | 0 refills | Status: DC | PRN

## 2017-11-20 MED ORDER — DEXAMETHASONE SODIUM PHOSPHATE 10 MG/ML IJ SOLN
INTRAMUSCULAR | Status: AC
  Filled 2017-11-20: qty 1

## 2017-11-20 MED ORDER — SODIUM CHLORIDE 0.9 % IV SOLN
1.2000 mg/m2 | Freq: Once | INTRAVENOUS | Status: AC
  Administered 2017-11-20: 2.3 mg via INTRAVENOUS
  Filled 2017-11-20: qty 2.3

## 2017-11-20 MED ORDER — HEPARIN SOD (PORK) LOCK FLUSH 100 UNIT/ML IV SOLN
500.0000 [IU] | Freq: Once | INTRAVENOUS | Status: AC | PRN
  Administered 2017-11-20: 500 [IU]
  Filled 2017-11-20: qty 5

## 2017-11-20 MED ORDER — PROCHLORPERAZINE MALEATE 10 MG PO TABS
10.0000 mg | ORAL_TABLET | Freq: Once | ORAL | Status: AC
  Administered 2017-11-20: 10 mg via ORAL

## 2017-11-20 MED ORDER — DEXAMETHASONE SODIUM PHOSPHATE 10 MG/ML IJ SOLN
4.0000 mg | Freq: Once | INTRAMUSCULAR | Status: AC
  Administered 2017-11-20: 4 mg via INTRAVENOUS

## 2017-11-20 MED ORDER — SODIUM CHLORIDE 0.9 % IV SOLN: 4.0000 mg | Freq: Once | INTRAVENOUS | Status: DC

## 2017-11-20 NOTE — Progress Notes (Signed)
Tolerated infusion w/o adverse reaction.  Alert, in no distress.  Neulasta OBI device filled per protocol and placed on right Upper Arm.  Needle/catheter placement noted prior to patient leaving. Tolerated without incident and aware of injection to be delivered in  27 hours, and may remove device after 28 hours. Discharged ambulatory.

## 2017-11-20 NOTE — Progress Notes (Signed)
Nutrition Follow-up:  Patient with extensive small cell lung cancer.  Patient being treated with topotecan.    Patient last saw RD on 3/5.    Met with patient during infusion today.  Patient reports nausea and has been taking them but ran out of pills. Also reports some constipation at times.  Also reports dysphagia when eating hamburger the other day.  Reports tolerated [place] sandwich for lunch yesterday without issue.  Reports yesterday drank carnation instant breakfast mixed with whole milk, some fruit, crackers.  Ate [place] sandwich, green beans and sweet potatoes and few pickles.  Drank 2 boost as well yesterday per patient and drank some V-8 juice.      Medications: zofran, compazine  Labs: glucose 113, Hgb 8.5  Anthropometrics:   Weight decreased to 149 lb 3.2 oz today from 167 lb 13 oz on 3/5 (last RD visit)  11% weight loss in the last 2 months, significant   Estimated Energy Needs  Kcals: 2000-2380 calories Protein: 100-119 g/d Fluid: 2 L/d  NUTRITION DIAGNOSIS: Malnutrition related to cancer and cancer related treatment side effects (nausea, constipation) as evidenced by 11% weight loss in the last 2 months and eating < 50% energy needs for > or equal to 5 days  MALNUTRITION DIAGNOSIS: Patient meets criteria for severe malnutrition in context of acute illness likely progressing to chronic as evidenced by 11% weight loss in 2 months and eating < 50% estimated energy needs for > or equal to 5 days.   INTERVENTION:  Encouraged oral nutrition supplements (high calorie shake) 2-3 times per day.  Provided patient with 1st complimentary case of ensure plus today. Encouraged patient to eat every 2 hours.  Discussed adding high calorie, high protein foods.   Encouraged patient to take nausea medication. RN, Ebony Hail called more tablets in today.   Encouraged patient to prevent constipation and discussed how to do this. Patient may benefit from appetite stimulant.  If  aspiration a concern for dysphagia would recommend SLP evaluation. Discussed adding gravies, sauces, etc to foods for easy of swallowing.   Contact information provided  MONITORING, EVALUATION, GOAL: weight trends, intake    NEXT VISIT: June 7 during infusion  Joli B. Zenia Resides, [place], [place] Registered Dietitian [phone] (pager)

## 2017-12-08 ENCOUNTER — Ambulatory Visit (HOSPITAL_COMMUNITY): Payer: Medicare PPO | Admitting: Hematology

## 2017-12-08 ENCOUNTER — Ambulatory Visit (HOSPITAL_COMMUNITY): Payer: Medicare PPO

## 2017-12-09 ENCOUNTER — Ambulatory Visit (HOSPITAL_COMMUNITY): Payer: Medicare PPO

## 2017-12-10 ENCOUNTER — Ambulatory Visit (HOSPITAL_COMMUNITY): Payer: Medicare PPO

## 2017-12-11 ENCOUNTER — Ambulatory Visit (HOSPITAL_COMMUNITY): Payer: Medicare PPO

## 2017-12-14 ENCOUNTER — Inpatient Hospital Stay (HOSPITAL_COMMUNITY): Payer: Medicare PPO | Attending: Hematology | Admitting: Hematology

## 2017-12-14 ENCOUNTER — Ambulatory Visit (HOSPITAL_COMMUNITY): Payer: Medicare PPO

## 2017-12-14 ENCOUNTER — Encounter (HOSPITAL_COMMUNITY): Payer: Self-pay | Admitting: Hematology

## 2017-12-14 ENCOUNTER — Inpatient Hospital Stay (HOSPITAL_COMMUNITY): Payer: Medicare PPO

## 2017-12-14 ENCOUNTER — Other Ambulatory Visit: Payer: Self-pay

## 2017-12-14 VITALS — BP 146/63 | HR 75 | Temp 97.9°F | Resp 16 | Wt 148.0 lb

## 2017-12-14 VITALS — BP 134/59 | HR 68 | Temp 97.5°F | Resp 18

## 2017-12-14 DIAGNOSIS — C3490 Malignant neoplasm of unspecified part of unspecified bronchus or lung: Secondary | ICD-10-CM | POA: Diagnosis not present

## 2017-12-14 DIAGNOSIS — G620 Drug-induced polyneuropathy: Secondary | ICD-10-CM | POA: Diagnosis not present

## 2017-12-14 DIAGNOSIS — T451X5A Adverse effect of antineoplastic and immunosuppressive drugs, initial encounter: Secondary | ICD-10-CM

## 2017-12-14 DIAGNOSIS — C787 Secondary malignant neoplasm of liver and intrahepatic bile duct: Secondary | ICD-10-CM

## 2017-12-14 DIAGNOSIS — D6481 Anemia due to antineoplastic chemotherapy: Secondary | ICD-10-CM | POA: Diagnosis not present

## 2017-12-14 DIAGNOSIS — F17210 Nicotine dependence, cigarettes, uncomplicated: Secondary | ICD-10-CM

## 2017-12-14 DIAGNOSIS — Z5111 Encounter for antineoplastic chemotherapy: Secondary | ICD-10-CM | POA: Diagnosis not present

## 2017-12-14 DIAGNOSIS — R112 Nausea with vomiting, unspecified: Secondary | ICD-10-CM | POA: Diagnosis not present

## 2017-12-14 LAB — COMPREHENSIVE METABOLIC PANEL
ALT: 8 U/L — ABNORMAL LOW (ref 17–63)
AST: 13 U/L — ABNORMAL LOW (ref 15–41)
Albumin: 3.9 g/dL (ref 3.5–5.0)
Alkaline Phosphatase: 60 U/L (ref 38–126)
Anion gap: 7 (ref 5–15)
BUN: 19 mg/dL (ref 6–20)
CO2: 25 mmol/L (ref 22–32)
Calcium: 9 mg/dL (ref 8.9–10.3)
Chloride: 106 mmol/L (ref 101–111)
Creatinine, Ser: 0.82 mg/dL (ref 0.61–1.24)
GFR calc Af Amer: >60 mL/min (ref >60)
GFR calc non Af Amer: >60 mL/min (ref >60)
Glucose, Bld: 104 mg/dL — ABNORMAL HIGH (ref 65–99)
Potassium: 3.8 mmol/L (ref 3.5–5.1)
Sodium: 138 mmol/L (ref 135–145)
Total Bilirubin: 0.6 mg/dL (ref 0.3–1.2)
Total Protein: 6.6 g/dL (ref 6.5–8.1)

## 2017-12-14 LAB — CBC WITH DIFFERENTIAL/PLATELET
Basophils Absolute: 0 10*3/uL (ref 0.0–0.1)
Basophils Relative: 0 %
Eosinophils Absolute: 0.1 10*3/uL (ref 0.0–0.7)
Eosinophils Relative: 1 %
HCT: 29.3 % — ABNORMAL LOW (ref 39.0–52.0)
Hemoglobin: 9.6 g/dL — ABNORMAL LOW (ref 13.0–17.0)
Lymphocytes Relative: 14 %
Lymphs Abs: 1.1 10*3/uL (ref 0.7–4.0)
MCH: 34.4 pg — ABNORMAL HIGH (ref 26.0–34.0)
MCHC: 32.8 g/dL (ref 30.0–36.0)
MCV: 105 fL — ABNORMAL HIGH (ref 78.0–100.0)
Monocytes Absolute: 1 10*3/uL (ref 0.1–1.0)
Monocytes Relative: 13 %
Neutro Abs: 5.8 10*3/uL (ref 1.7–7.7)
Neutrophils Relative %: 72 %
Platelets: 408 10*3/uL — ABNORMAL HIGH (ref 150–400)
RBC: 2.79 MIL/uL — ABNORMAL LOW (ref 4.22–5.81)
RDW: 19.6 % — ABNORMAL HIGH (ref 11.5–15.5)
WBC: 8 10*3/uL (ref 4.0–10.5)

## 2017-12-14 MED ORDER — SODIUM CHLORIDE 0.9 % IV SOLN: 4.0000 mg | Freq: Once | INTRAVENOUS | Status: DC

## 2017-12-14 MED ORDER — SODIUM CHLORIDE 0.9% FLUSH
10.0000 mL | INTRAVENOUS | Status: DC | PRN
  Administered 2017-12-14: 10 mL
  Filled 2017-12-14: qty 10

## 2017-12-14 MED ORDER — ONDANSETRON HCL 4 MG/2ML IJ SOLN
4.0000 mg | Freq: Once | INTRAMUSCULAR | Status: AC
  Administered 2017-12-14: 4 mg via INTRAVENOUS

## 2017-12-14 MED ORDER — SODIUM CHLORIDE 0.9 % IV SOLN
Freq: Once | INTRAVENOUS | Status: AC
  Administered 2017-12-14: 500 mL via INTRAVENOUS

## 2017-12-14 MED ORDER — DEXAMETHASONE SODIUM PHOSPHATE 10 MG/ML IJ SOLN
4.0000 mg | Freq: Once | INTRAMUSCULAR | Status: AC
  Administered 2017-12-14: 4 mg via INTRAVENOUS
  Filled 2017-12-14: qty 1

## 2017-12-14 MED ORDER — SODIUM CHLORIDE 0.9 % IV SOLN: Freq: Once | INTRAVENOUS | Status: DC

## 2017-12-14 MED ORDER — ONDANSETRON HCL 4 MG/2ML IJ SOLN
INTRAMUSCULAR | Status: AC
  Filled 2017-12-14: qty 2

## 2017-12-14 MED ORDER — GABAPENTIN 300 MG PO CAPS: 300.0000 mg | ORAL_CAPSULE | Freq: Two times a day (BID) | ORAL | 4 refills | Status: DC

## 2017-12-14 MED ORDER — HEPARIN SOD (PORK) LOCK FLUSH 100 UNIT/ML IV SOLN
500.0000 [IU] | Freq: Once | INTRAVENOUS | Status: DC | PRN
Start: 2017-12-14 — End: 2017-12-14
  Filled 2017-12-14: qty 5

## 2017-12-14 MED ORDER — TOPOTECAN HCL CHEMO INJECTION 4 MG
1.2000 mg/m2 | Freq: Once | INTRAVENOUS | Status: AC
  Administered 2017-12-14: 2.3 mg via INTRAVENOUS
  Filled 2017-12-14: qty 2.3

## 2017-12-14 NOTE — Progress Notes (Signed)
Is modestly taking probably leaflet  [place], [place] [address]   CLINIC:  Medical Oncology/Hematology  PCP:  [name], MD [address] [facility] [place] [address] [phone]   REASON FOR VISIT:  Follow-up for small cell lung cancer.  CURRENT THERAPY: Topotecancan every 4 weeks.  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 Bulky 5.4 cm right supraclavicular region malignant lymph node conglomeration with extracapsular extension. 2. Surrounding smaller abnormal right level 3 and level 5 lymph nodes, and the lymphadenopathy continues into the superior mediastinum, see Chest CT findings reported separately. 3. No other metastatic disease identified in the neck.      01/16/2017 Imaging    CT chest: IMPRESSION: 1. Extensive lymphadenopathy in the thorax and lower right cervical region, as discussed above. Primary differential considerations include lymphoma/leukemia or small cell carcinoma of the lung. Further evaluation a PET-CT could be considered to assess for additional sites of disease below the diaphragm if clinically appropriate. Additionally, ultrasound-guided biopsy of supraclavicular lymphadenopathy could be considered to establish a tissue diagnosis. 2. Indeterminate lesion in the periphery of segment 8 of the liver measuring 2.7 x 1.7 cm. Attention at time of follow-up PET-CT is recommended. 3. Aortic atherosclerosis, in addition to left main and 3 vessel coronary artery disease. Please note that although the presence of coronary artery calcium documents the presence of coronary artery disease, the severity of this disease and any potential stenosis cannot be assessed on this non-gated CT examination. Assessment for potential risk factor modification, dietary therapy or pharmacologic therapy may be warranted, if clinically  indicated. 4. There are calcifications of the aortic valve. Echocardiographic correlation for evaluation of potential valvular dysfunction may be warranted if clinically indicated. 5. Diffuse bronchial wall thickening with moderate centrilobular and paraseptal emphysema; imaging findings suggestive of underlying COPD.      02/03/2017 Initial Biopsy    (R) neck lymph node biopsy: SMALL CELL CARCINOMA (most likely lung primary).       02/03/2017 Miscellaneous    Port-a-cath attempted by IR; unable to place d/t enlarged SVC.       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 Staging for metastatic disease  EXAM: MRI HEAD WITHOUT AND WITH CONTRAST  TECHNIQUE: Multiplanar, multiecho pulse sequences of the brain and surrounding structures were obtained without and with intravenous contrast.  CONTRAST:  33mL MULTIHANCE GADOBENATE DIMEGLUMINE 529 MG/ML IV SOLN  COMPARISON:  None.  FINDINGS: Brain: Negative for hydrocephalus. Cerebral volume normal for age. Small nonenhancing white matter hyperintensities consistent with mild chronic microvascular ischemia. No acute infarct. Negative for hemorrhage or mass or edema  Normal enhancement postcontrast infusion. No enhancing mass lesion. Leptomeningeal enhancement is normal.  Vascular: Normal arterial flow voids.  Normal venous enhancement  Skull and upper cervical spine: Negative  Sinuses/Orbits: Negative  Other: None  IMPRESSION: Negative for metastatic disease.  No acute abnormality.  Mild chronic white matter changes.      04/07/2017 Imaging     PET:  1. Marked reduction in size and metabolic activity of bulky RIGHT supraclavicular adenopathy mediastinal lymphadenopathy. 2. Residual  moderate activity remains within small RIGHT supraclavicular lymph node, RIGHT lower paratracheal lymph node and RIGHT hilar lymph node. 3. Resolution of prevascular and internal mammary mediastinal metastatic hypermetabolic activity. 4. Resolution of metabolic activity associated with solitary RIGHT hepatic lobe liver metastasis. 5. No evidence of disease progression. 6. 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      05/27/2017 Imaging    MRI brain w/ and w/o contrast IMPRESSION: 1. No metastatic disease identified. 2. Increased nonspecific cerebral white matter signal changes since August. These are most commonly small vessel disease related. 3. New right maxillary sinusitis. Benign appearing retention cysts in the nasopharynx with trace mastoid effusions.      06/12/2017 Imaging    PET-CT IMPRESSION: 1. There are two new hypermetabolic nodules identified within both lower lobes measuring up to 3.1 cm. The appearance is nonspecific and may be inflammatory/infectious in etiology. Pulmonary metastatic disease cannot be excluded and short-term follow-up imaging in 3 months is advised to reassess these nodules. 2. Stable appearance of mild hypermetabolic activity associated with right paratracheal and right hilar lymph nodes. 3. Decrease in FDG uptake associated with index right supraclavicular lymph node. 4. 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 5. Aortic Atherosclerosis (ICD10-I70.0) and Emphysema (ICD10-J43.9).        CANCER STAGING: Cancer Staging No matching staging information was found for the patient.   INTERVAL HISTORY:  [name] 67 y.o. male returns for routine follow-up and consideration for next cycle of chemotherapy.   He is due for cycle 6 today.  Once we  stretched his chemotherapy to every 4 weeks, he felt better.  The extra week helped him to recuperate and regain his energy levels.  He denies any bleeding per rectum or melena.  He does have some nausea on and off.  He also vomited a few times.  His numbness is not completely controlled with gabapentin 300 mg at bedtime.  He denies any fevers or infections.  Denies any weight loss.  His weight has been stable for the last 1 to 2 months.  Prior to that he lost about 20 pounds since February.Marland Kitchen     REVIEW OF SYSTEMS:  Review of Systems  Constitutional: Positive for fatigue.  Respiratory: Positive for shortness of breath.   Gastrointestinal: Positive for nausea.  Neurological: Positive for dizziness and numbness.  All other systems reviewed and are negative.    PAST MEDICAL/SURGICAL HISTORY:  Past Medical History:  Diagnosis Date  . Anxiety   . CAD (coronary artery disease)   . Cancer ([facility])    stage 4 small cell lung cancer   . COPD (chronic obstructive pulmonary disease) ([facility])   . Depression   . Dyspnea    increased exertion  . Feeling of chest tightness   . Heart palpitations   . History of chemotherapy   . Myocardial infarction ([facility])   . Osteopenia   . Panic attacks   . Smoker    Past Surgical History:  Procedure Laterality Date  . BACK SURGERY  12/24/2000   L5,S1  . CORONARY STENT PLACEMENT  2005   RCA & CX  . HERNIA REPAIR Right 1980's  . INGUINAL HERNIA REPAIR  12/1978   right side  . IR FLUORO GUIDE PORT INSERTION RIGHT  04/02/2017  . IR US GUIDE BX ASP/DRAIN  02/03/2017  . IR US  GUIDE VASC ACCESS RIGHT  04/02/2017  . NM MYOCAR PERF WALL MOTION  09/07/2009   No ischemia; EF 51%  . SHOULDER SURGERY Left 08/2010  . SPINE SURGERY  2002   L5-S1     SOCIAL HISTORY:  Social History   Socioeconomic History  . Marital status: Single    Spouse name: Not on file  . Number of children: Not on file  . Years of education: Not on file  . Highest education level: Not on  file  Occupational History  . Not on file  Social Needs  . Financial resource strain: Not on file  . Food insecurity:    Worry: Not on file    Inability: Not on file  . Transportation needs:    Medical: Not on file    Non-medical: Not on file  Tobacco Use  . Smoking status: Current Every Day Smoker    Packs/day: 1.00    Types: Cigarettes  . Smokeless tobacco: Never Used  Substance and Sexual Activity  . Alcohol use: Yes    Comment: occas  . Drug use: No  . Sexual activity: Not on file  Lifestyle  . Physical activity:    Days per week: Not on file    Minutes per session: Not on file  . Stress: Not on file  Relationships  . Social connections:    Talks on phone: Not on file    Gets together: Not on file    Attends religious service: Not on file    Active member of club or organization: Not on file    Attends meetings of clubs or organizations: Not on file    Relationship status: Not on file  . Intimate partner violence:    Fear of current or ex partner: Not on file    Emotionally abused: Not on file    Physically abused: Not on file    Forced sexual activity: Not on file  Other Topics Concern  . Not on file  Social History Narrative  . Not on file    FAMILY HISTORY:  Family History  Problem Relation Age of Onset  . Heart attack Father   . Kidney disease Father        renal failure  . Heart failure Mother   . Heart attack Mother   . Cancer Brother   . Diabetes Brother   . Alcohol abuse Brother   . Diabetes Sister     CURRENT MEDICATIONS:  Outpatient Encounter Medications as of 12/14/2017  Medication Sig  . ALPRAZolam (XANAX) 1 MG tablet TAKE (1) TABLET BY MOUTH (4) TIMES DAILY.  Marland Kitchen atorvastatin (LIPITOR) 10 MG tablet TAKE 1 TABLET AT BEDTIME  (DISCONTINUE ATORVASTATIN 20MG )  . clotrimazole-betamethasone (LOTRISONE) cream Apply 1 application topically 2 (two) times daily.  . cyclobenzaprine (FLEXERIL) 5 MG tablet TAKE 1 TABLET THREE TIMES DAILY AS NEEDED FOR  MUSCLE SPASMS.  Marland Kitchen dexamethasone (DECADRON) 4 MG tablet   . gabapentin (NEURONTIN) 300 MG capsule Take 1 capsule (300 mg total) by mouth 2 (two) times daily.  Marland Kitchen HYDROcodone-acetaminophen (NORCO) 5-325 MG tablet Take 1 tablet by mouth every 6 (six) hours as needed for moderate pain.  Marland Kitchen lidocaine-prilocaine (EMLA) cream Apply to affected area once  . ondansetron (ZOFRAN) 8 MG tablet Take 1 tablet (8 mg total) by mouth 2 (two) times daily as needed. Start on the third day after cisplatin chemotherapy.  . Pegfilgrastim (NEULASTA ONPRO [place]) Inject into the skin. Every 21 days  . polyethylene glycol powder (  GLYCOLAX/MIRALAX) powder Take 17 g by mouth daily.  . prochlorperazine (COMPAZINE) 10 MG tablet Take 1 tablet (10 mg total) by mouth every 6 (six) hours as needed (Nausea or vomiting).  . topotecan in sodium chloride 0.9 % 100 mL Inject into the vein once. Days 1-5 every 21 days  . traZODone (DESYREL) 100 MG tablet Take 400 mg by mouth at bedtime.  . [DISCONTINUED] gabapentin (NEURONTIN) 300 MG capsule Take 1 capsule (300 mg total) by mouth at bedtime.   No facility-administered encounter medications on file as of 12/14/2017.     ALLERGIES:  Allergies  Allergen Reactions  . Codeine Nausea Only  . Niaspan [Niacin Er]      PHYSICAL EXAM:  ECOG Performance status: 1  Vitals:   12/14/17 0928  BP: (!) 146/63  Pulse: 75  Resp: 16  Temp: 97.9 F (36.6 C)  SpO2: 98%   Filed Weights   12/14/17 0928  Weight: 148 lb (67.1 kg)    Physical Exam HEENT shows no thrush or mucositis. Chest is bilaterally clear to auscultation. Cardiovascular S1-S2 regular rate and rhythm. Extremities no edema cyanosis.  LABORATORY DATA:  I have reviewed the labs as listed.  CBC    Component Value Date/Time   WBC 8.0 12/14/2017 0931   RBC 2.79 (L) 12/14/2017 0931   HGB 9.6 (L) 12/14/2017 0931   HCT 29.3 (L) 12/14/2017 0931   PLT 408 (H) 12/14/2017 0931   MCV 105.0 (H) 12/14/2017 0931   MCH 34.4 (H)  12/14/2017 0931   MCHC 32.8 12/14/2017 0931   RDW 19.6 (H) 12/14/2017 0931   LYMPHSABS 1.1 12/14/2017 0931   MONOABS 1.0 12/14/2017 0931   EOSABS 0.1 12/14/2017 0931   BASOSABS 0.0 12/14/2017 0931   CMP Latest Ref Rng & Units 12/14/2017 11/16/2017 10/26/2017  Glucose 65 - 99 mg/dL 104(H) 113(H) 108(H)  BUN 6 - 20 mg/dL 19 20 17   Creatinine 0.61 - 1.24 mg/dL 0.82 0.88 0.81  Sodium 135 - 145 mmol/L 138 137 135  Potassium 3.5 - 5.1 mmol/L 3.8 3.6 3.9  Chloride 101 - 111 mmol/L 106 105 103  CO2 22 - 32 mmol/L 25 24 25   Calcium 8.9 - 10.3 mg/dL 9.0 9.0 8.8(L)  Total Protein 6.5 - 8.1 g/dL 6.6 6.6 6.5  Total Bilirubin 0.3 - 1.2 mg/dL 0.6 0.5 0.6  Alkaline Phos 38 - 126 U/L 60 75 61  AST 15 - 41 U/L 13(L) 15 15  ALT 17 - 63 U/L 8(L) 11(L) 13(L)         ASSESSMENT & PLAN:   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 4 weeks - After changing his cycles to every 4 weeks, he felt much better in terms of energy.  He will proceed with cycle 6 today.  He will come back in 4 weeks with repeat scans and labs.  2.  Normocytic anemia: This is chemotherapy-induced.  His hemoglobin prior to start of Topotecan was 12.4.  Last Feraheme was on 10/29/2017.  3.  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  He is taking Neurontin 300 mg at  bedtime.  We will increase Neurontin to twice daily.  she is  4.  Nausea and vomiting: Patient has occasional nausea and vomiting at home.  He is taking 1 tablet of Compazine and 1 tablet of Zofran in the morning and in the afternoon.  I will add Zofran 4 mg IV to the premeds.  He is already receiving dexamethasone 4 mg IV.      Orders placed this encounter:  Orders Placed This Encounter  Procedures  . CT Chest W Contrast  . CT Abdomen Pelvis W Contrast  . CBC with Differential  . Comprehensive metabolic panel      Derek Jack, MD [place] [phone]

## 2017-12-14 NOTE — Assessment & Plan Note (Signed)
1.  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 4 weeks - After changing his cycles to every 4 weeks, he felt much better in terms of energy.  He will proceed with cycle 6 today.  He will come back in 4 weeks with repeat scans and labs.  2.  Normocytic anemia: This is chemotherapy-induced.  His hemoglobin prior to start of Topotecan was 12.4.  Last Feraheme was on 10/29/2017.  3.  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  He is taking Neurontin 300 mg at bedtime.  We will increase Neurontin to twice daily.  she is  4.  Nausea and vomiting: Patient has occasional nausea and vomiting at home.  He is taking 1 tablet of Compazine and 1 tablet of Zofran in the morning and in the afternoon.  I will add Zofran 4 mg IV to the premeds.  He is already receiving dexamethasone 4 mg IV.

## 2017-12-14 NOTE — Progress Notes (Signed)
Labs reviewed and patient seen today by Dr. Delton Coombes. Will proceed with treatment.   Treatment given per orders. Patient tolerated it well without problems. Vitals stable and discharged home from clinic ambulatory. Follow up as scheduled.

## 2017-12-14 NOTE — Patient Instructions (Signed)
[facility] Discharge Instructions for Patients Receiving Chemotherapy   Beginning January 23rd 2017 lab work for the Cancer Center will be done in the  Main lab at  on 1st floor. If you have a lab appointment with the Cancer Center please come in thru the  Main Entrance and check in at the main information desk   Today you received the following chemotherapy agents   To help prevent nausea and vomiting after your treatment, we encourage you to take your nausea medication     If you develop nausea and vomiting, or diarrhea that is not controlled by your medication, call the clinic.  The clinic phone number is [phone]. Office hours are Monday-Friday 8:30am-5:00pm.  BELOW ARE SYMPTOMS THAT SHOULD BE REPORTED IMMEDIATELY:  *FEVER GREATER THAN 101.0 F  *CHILLS WITH OR WITHOUT FEVER  NAUSEA AND VOMITING THAT IS NOT CONTROLLED WITH YOUR NAUSEA MEDICATION  *UNUSUAL SHORTNESS OF BREATH  *UNUSUAL BRUISING OR BLEEDING  TENDERNESS IN MOUTH AND THROAT WITH OR WITHOUT PRESENCE OF ULCERS  *URINARY PROBLEMS  *BOWEL PROBLEMS  UNUSUAL RASH Items with * indicate a potential emergency and should be followed up as soon as possible. If you have an emergency after office hours please contact your primary care physician or go to the nearest emergency department.  Please call the clinic during office hours if you have any questions or concerns.   You may also contact the Patient Navigator at [phone] should you have any questions or need assistance in obtaining follow up care.      Resources For Cancer Patients and their Caregivers ? American Cancer Society: Can assist with transportation, wigs, general needs, runs Look Good Feel Better.        1-888-227-6333 ? Cancer Care: Provides financial assistance, online support groups, medication/co-pay assistance.  1-800-813-HOPE (4673) ? [facility] Assists [place] cancer  patients and their families through emotional , educational and financial support.  336-427-4357 ? [place] DSS Where to apply for food stamps, Medicaid and utility assistance. 336-342-1394 ? [facility]: Transportation to medical appointments. 336-347-2287 ? Social Security Administration: May apply for disability if have a Stage IV cancer. 336-342-7796 1-800-772-1213 ? [place] Aging, Disability and Transit Services: Assists with nutrition, care and transit needs. 336-349-2343         

## 2017-12-15 ENCOUNTER — Inpatient Hospital Stay (HOSPITAL_COMMUNITY): Payer: Medicare PPO

## 2017-12-15 VITALS — BP 100/57 | HR 65 | Temp 97.5°F | Resp 18

## 2017-12-15 DIAGNOSIS — C3490 Malignant neoplasm of unspecified part of unspecified bronchus or lung: Secondary | ICD-10-CM

## 2017-12-15 DIAGNOSIS — C787 Secondary malignant neoplasm of liver and intrahepatic bile duct: Secondary | ICD-10-CM | POA: Diagnosis not present

## 2017-12-15 DIAGNOSIS — Z5111 Encounter for antineoplastic chemotherapy: Secondary | ICD-10-CM | POA: Diagnosis not present

## 2017-12-15 MED ORDER — SODIUM CHLORIDE 0.9% FLUSH
10.0000 mL | INTRAVENOUS | Status: DC | PRN
  Administered 2017-12-15: 10 mL
  Filled 2017-12-15: qty 10

## 2017-12-15 MED ORDER — DEXAMETHASONE SODIUM PHOSPHATE 10 MG/ML IJ SOLN
4.0000 mg | Freq: Once | INTRAMUSCULAR | Status: AC
  Administered 2017-12-15: 4 mg via INTRAVENOUS
  Filled 2017-12-15: qty 1

## 2017-12-15 MED ORDER — SODIUM CHLORIDE 0.9 % IV SOLN
Freq: Once | INTRAVENOUS | Status: AC
  Administered 2017-12-15: 11:00:00 via INTRAVENOUS

## 2017-12-15 MED ORDER — ONDANSETRON HCL 4 MG/2ML IJ SOLN
4.0000 mg | Freq: Once | INTRAMUSCULAR | Status: AC
  Administered 2017-12-15: 4 mg via INTRAVENOUS

## 2017-12-15 MED ORDER — ONDANSETRON HCL 4 MG/2ML IJ SOLN
INTRAMUSCULAR | Status: AC
  Filled 2017-12-15: qty 2

## 2017-12-15 MED ORDER — SODIUM CHLORIDE 0.9 % IV SOLN: 4.0000 mg | Freq: Once | INTRAVENOUS | Status: DC

## 2017-12-15 MED ORDER — DEXAMETHASONE SODIUM PHOSPHATE 10 MG/ML IJ SOLN
INTRAMUSCULAR | Status: AC
  Filled 2017-12-15: qty 1

## 2017-12-15 MED ORDER — TOPOTECAN HCL CHEMO INJECTION 4 MG
1.2000 mg/m2 | Freq: Once | INTRAVENOUS | Status: AC
  Administered 2017-12-15: 2.3 mg via INTRAVENOUS
  Filled 2017-12-15: qty 2.3

## 2017-12-15 MED ORDER — SODIUM CHLORIDE 0.9 % IV SOLN: Freq: Once | INTRAVENOUS | Status: DC

## 2017-12-15 NOTE — Patient Instructions (Signed)
[facility] Discharge Instructions for Patients Receiving Chemotherapy   Beginning January 23rd 2017 lab work for the Cancer Center will be done in the  Main lab at [place] on 1st floor. If you have a lab appointment with the Cancer Center please come in thru the  Main Entrance and check in at the main information desk   Today you received the following chemotherapy agents   To help prevent nausea and vomiting after your treatment, we encourage you to take your nausea medication     If you develop nausea and vomiting, or diarrhea that is not controlled by your medication, call the clinic.  The clinic phone number is [phone]. Office hours are Monday-Friday 8:30am-5:00pm.  BELOW ARE SYMPTOMS THAT SHOULD BE REPORTED IMMEDIATELY:  *FEVER GREATER THAN 101.0 F  *CHILLS WITH OR WITHOUT FEVER  NAUSEA AND VOMITING THAT IS NOT CONTROLLED WITH YOUR NAUSEA MEDICATION  *UNUSUAL SHORTNESS OF BREATH  *UNUSUAL BRUISING OR BLEEDING  TENDERNESS IN MOUTH AND THROAT WITH OR WITHOUT PRESENCE OF ULCERS  *URINARY PROBLEMS  *BOWEL PROBLEMS  UNUSUAL RASH Items with * indicate a potential emergency and should be followed up as soon as possible. If you have an emergency after office hours please contact your primary care physician or go to the nearest emergency department.  Please call the clinic during office hours if you have any questions or concerns.   You may also contact the Patient Navigator at [phone] should you have any questions or need assistance in obtaining follow up care.      Resources For Cancer Patients and their Caregivers ? American Cancer Society: Can assist with transportation, wigs, general needs, runs Look Good Feel Better.        1-888-227-6333 ? Cancer Care: Provides financial assistance, online support groups, medication/co-pay assistance.  1-800-813-HOPE (4673) ? [facility] Assists [place] cancer  patients and their families through emotional , educational and financial support.  336-427-4357 ? [place] DSS Where to apply for food stamps, Medicaid and utility assistance. 336-342-1394 ? [facility]: Transportation to medical appointments. 336-347-2287 ? Social Security Administration: May apply for disability if have a Stage IV cancer. 336-342-7796 1-800-772-1213 ? [place] Aging, Disability and Transit Services: Assists with nutrition, care and transit needs. 336-349-2343         

## 2017-12-16 ENCOUNTER — Other Ambulatory Visit: Payer: Self-pay | Admitting: Family Medicine

## 2017-12-16 ENCOUNTER — Encounter (HOSPITAL_COMMUNITY): Payer: Self-pay

## 2017-12-16 ENCOUNTER — Inpatient Hospital Stay (HOSPITAL_COMMUNITY): Payer: Medicare PPO

## 2017-12-16 VITALS — BP 112/57 | HR 62 | Temp 97.5°F | Resp 18 | Wt 148.8 lb

## 2017-12-16 DIAGNOSIS — C3490 Malignant neoplasm of unspecified part of unspecified bronchus or lung: Secondary | ICD-10-CM | POA: Diagnosis not present

## 2017-12-16 DIAGNOSIS — Z5111 Encounter for antineoplastic chemotherapy: Secondary | ICD-10-CM | POA: Diagnosis not present

## 2017-12-16 DIAGNOSIS — C787 Secondary malignant neoplasm of liver and intrahepatic bile duct: Secondary | ICD-10-CM | POA: Diagnosis not present

## 2017-12-16 MED ORDER — SODIUM CHLORIDE 0.9 % IV SOLN
Freq: Once | INTRAVENOUS | Status: AC
  Administered 2017-12-16: 11:00:00 via INTRAVENOUS

## 2017-12-16 MED ORDER — ONDANSETRON HCL 4 MG/2ML IJ SOLN
4.0000 mg | Freq: Once | INTRAMUSCULAR | Status: AC
  Administered 2017-12-16: 4 mg via INTRAVENOUS
  Filled 2017-12-16: qty 2

## 2017-12-16 MED ORDER — SODIUM CHLORIDE 0.9 % IV SOLN: 4.0000 mg | Freq: Once | INTRAVENOUS | Status: DC

## 2017-12-16 MED ORDER — HEPARIN SOD (PORK) LOCK FLUSH 100 UNIT/ML IV SOLN
500.0000 [IU] | Freq: Once | INTRAVENOUS | Status: AC | PRN
  Administered 2017-12-16: 500 [IU]
  Filled 2017-12-16: qty 5

## 2017-12-16 MED ORDER — SODIUM CHLORIDE 0.9% FLUSH
10.0000 mL | INTRAVENOUS | Status: DC | PRN
  Administered 2017-12-16: 10 mL
  Filled 2017-12-16: qty 10

## 2017-12-16 MED ORDER — TOPOTECAN HCL CHEMO INJECTION 4 MG
1.2000 mg/m2 | Freq: Once | INTRAVENOUS | Status: AC
  Administered 2017-12-16: 2.3 mg via INTRAVENOUS
  Filled 2017-12-16: qty 2.3

## 2017-12-16 MED ORDER — DEXAMETHASONE SODIUM PHOSPHATE 10 MG/ML IJ SOLN
4.0000 mg | Freq: Once | INTRAMUSCULAR | Status: AC
  Administered 2017-12-16: 4 mg via INTRAVENOUS
  Filled 2017-12-16: qty 1

## 2017-12-16 MED ORDER — SODIUM CHLORIDE 0.9 % IV SOLN
Freq: Once | INTRAVENOUS | Status: DC
  Filled 2017-12-16: qty 2

## 2017-12-16 NOTE — Patient Instructions (Signed)
[facility] Discharge Instructions for Patients Receiving Chemotherapy   Beginning January 23rd 2017 lab work for the [facility] will be done in the  Main lab at [facility] on 1st floor. If you have a lab appointment with the [facility] please come in thru the  Main Entrance and check in at the main information desk   Today you received the following chemotherapy agents Topotecan. Follow-up as scheduled. Call clinic for any questions or concerns  To help prevent nausea and vomiting after your treatment, we encourage you to take your nausea medication.   If you develop nausea and vomiting, or diarrhea that is not controlled by your medication, call the clinic.  The clinic phone number is [phone]. Office hours are Monday-Friday 8:30am-5:00pm.  BELOW ARE SYMPTOMS THAT SHOULD BE REPORTED IMMEDIATELY:  *FEVER GREATER THAN 101.0 F  *CHILLS WITH OR WITHOUT FEVER  NAUSEA AND VOMITING THAT IS NOT CONTROLLED WITH YOUR NAUSEA MEDICATION  *UNUSUAL SHORTNESS OF BREATH  *UNUSUAL BRUISING OR BLEEDING  TENDERNESS IN MOUTH AND THROAT WITH OR WITHOUT PRESENCE OF ULCERS  *URINARY PROBLEMS  *BOWEL PROBLEMS  UNUSUAL RASH Items with * indicate a potential emergency and should be followed up as soon as possible. If you have an emergency after office hours please contact your primary care physician or go to the nearest emergency department.  Please call the clinic during office hours if you have any questions or concerns.   You may also contact the Patient Navigator at [phone] should you have any questions or need assistance in obtaining follow up care.      Resources For Cancer Patients and their Caregivers ? American Cancer Society: Can assist with transportation, wigs, general needs, runs Look Good Feel Better.        (321)491-9044 ? Cancer Care: Provides financial assistance, online support groups, medication/co-pay assistance.  1-800-813-HOPE  515-185-1351) ? [facility] Assists [place] cancer patients and their families through emotional , educational and financial support.  (716) 134-8533 ? [place] DSS Where to apply for food stamps, Medicaid and utility assistance. (403)198-0971 ? [facility]: Transportation to medical appointments. 682-449-4790 ? Social Security Administration: May apply for disability if have a Stage IV cancer. 2534969687 (870)047-2644 ? [employer], Disability and Transit Services: Assists with nutrition, care and transit needs. ([phone] [phone]

## 2017-12-16 NOTE — Telephone Encounter (Signed)
Ok to refill??  Last office visit 05/19/2017.  Last refill on both 11/16/2017.

## 2017-12-16 NOTE — Progress Notes (Signed)
[name] tolerated Topotecan infusion well without complaints or incident. Port flushed and left accessed for use tomorrow. VSS upon discharge. Pt discharged self ambulatory in satisfactory condition

## 2017-12-17 ENCOUNTER — Encounter (HOSPITAL_COMMUNITY): Payer: Self-pay

## 2017-12-17 ENCOUNTER — Inpatient Hospital Stay (HOSPITAL_COMMUNITY): Payer: Medicare PPO

## 2017-12-17 VITALS — BP 120/56 | HR 59 | Temp 97.6°F | Resp 18

## 2017-12-17 DIAGNOSIS — C787 Secondary malignant neoplasm of liver and intrahepatic bile duct: Secondary | ICD-10-CM | POA: Diagnosis not present

## 2017-12-17 DIAGNOSIS — C3490 Malignant neoplasm of unspecified part of unspecified bronchus or lung: Secondary | ICD-10-CM | POA: Diagnosis not present

## 2017-12-17 DIAGNOSIS — Z5111 Encounter for antineoplastic chemotherapy: Secondary | ICD-10-CM | POA: Diagnosis not present

## 2017-12-17 MED ORDER — ONDANSETRON HCL 4 MG/2ML IJ SOLN
INTRAMUSCULAR | Status: AC
  Filled 2017-12-17: qty 2

## 2017-12-17 MED ORDER — SODIUM CHLORIDE 0.9 % IV SOLN: 4.0000 mg | Freq: Once | INTRAVENOUS | Status: DC

## 2017-12-17 MED ORDER — ONDANSETRON HCL 4 MG/2ML IJ SOLN
4.0000 mg | Freq: Once | INTRAMUSCULAR | Status: AC
  Administered 2017-12-17: 4 mg via INTRAVENOUS

## 2017-12-17 MED ORDER — SODIUM CHLORIDE 0.9 % IV SOLN: Freq: Once | INTRAVENOUS | Status: DC

## 2017-12-17 MED ORDER — SODIUM CHLORIDE 0.9% FLUSH
10.0000 mL | INTRAVENOUS | Status: DC | PRN
  Administered 2017-12-17: 10 mL
  Filled 2017-12-17: qty 10

## 2017-12-17 MED ORDER — TOPOTECAN HCL CHEMO INJECTION 4 MG
1.2000 mg/m2 | Freq: Once | INTRAVENOUS | Status: AC
  Administered 2017-12-17: 2.3 mg via INTRAVENOUS
  Filled 2017-12-17: qty 2.3

## 2017-12-17 MED ORDER — SODIUM CHLORIDE 0.9 % IV SOLN
Freq: Once | INTRAVENOUS | Status: AC
  Administered 2017-12-17: 10:00:00 via INTRAVENOUS

## 2017-12-17 MED ORDER — DEXAMETHASONE SODIUM PHOSPHATE 10 MG/ML IJ SOLN
INTRAMUSCULAR | Status: AC
  Filled 2017-12-17: qty 1

## 2017-12-17 MED ORDER — DEXAMETHASONE SODIUM PHOSPHATE 10 MG/ML IJ SOLN
4.0000 mg | Freq: Once | INTRAMUSCULAR | Status: AC
  Administered 2017-12-17: 4 mg via INTRAVENOUS

## 2017-12-17 NOTE — Progress Notes (Signed)
Patient tolerated chemotherapy with no complaints voiced.  Good blood return noted before and after treatment.  Port site clean and dry with no bruising or swelling noted at site.  No complaints of pain with flush.  Dressing intact.  VSS with discharge and left ambulatory with no s/s of distress noted.

## 2017-12-17 NOTE — Patient Instructions (Signed)
[facility] Discharge Instructions for Patients Receiving Chemotherapy  Today you received the following chemotherapy agents topetecan.    If you develop nausea and vomiting that is not controlled by your nausea medication, call the clinic.   BELOW ARE SYMPTOMS THAT SHOULD BE REPORTED IMMEDIATELY:  *FEVER GREATER THAN 100.5 F  *CHILLS WITH OR WITHOUT FEVER  NAUSEA AND VOMITING THAT IS NOT CONTROLLED WITH YOUR NAUSEA MEDICATION  *UNUSUAL SHORTNESS OF BREATH  *UNUSUAL BRUISING OR BLEEDING  TENDERNESS IN MOUTH AND THROAT WITH OR WITHOUT PRESENCE OF ULCERS  *URINARY PROBLEMS  *BOWEL PROBLEMS  UNUSUAL RASH Items with * indicate a potential emergency and should be followed up as soon as possible.  Feel free to call the clinic should you have any questions or concerns. The clinic phone number is [phone].  Please show the [place] at check-in to the Emergency Department and triage nurse.

## 2017-12-18 ENCOUNTER — Inpatient Hospital Stay (HOSPITAL_COMMUNITY): Payer: Medicare PPO

## 2017-12-18 VITALS — BP 116/55 | HR 63 | Temp 97.6°F | Resp 16 | Wt 148.6 lb

## 2017-12-18 DIAGNOSIS — Z5111 Encounter for antineoplastic chemotherapy: Secondary | ICD-10-CM | POA: Diagnosis not present

## 2017-12-18 DIAGNOSIS — C3490 Malignant neoplasm of unspecified part of unspecified bronchus or lung: Secondary | ICD-10-CM | POA: Diagnosis not present

## 2017-12-18 DIAGNOSIS — C787 Secondary malignant neoplasm of liver and intrahepatic bile duct: Secondary | ICD-10-CM | POA: Diagnosis not present

## 2017-12-18 MED ORDER — ONDANSETRON HCL 40 MG/20ML IJ SOLN: Freq: Once | INTRAMUSCULAR | Status: DC

## 2017-12-18 MED ORDER — SODIUM CHLORIDE 0.9 % IV SOLN
Freq: Once | INTRAVENOUS | Status: AC
  Administered 2017-12-18: 11:00:00 via INTRAVENOUS

## 2017-12-18 MED ORDER — DEXAMETHASONE SODIUM PHOSPHATE 10 MG/ML IJ SOLN: 10.0000 mg | Freq: Once | INTRAMUSCULAR | Status: DC

## 2017-12-18 MED ORDER — TOPOTECAN HCL CHEMO INJECTION 4 MG
1.2000 mg/m2 | Freq: Once | INTRAVENOUS | Status: AC
  Administered 2017-12-18: 2.3 mg via INTRAVENOUS
  Filled 2017-12-18: qty 2.3

## 2017-12-18 MED ORDER — HEPARIN SOD (PORK) LOCK FLUSH 100 UNIT/ML IV SOLN
500.0000 [IU] | Freq: Once | INTRAVENOUS | Status: AC | PRN
  Administered 2017-12-18: 500 [IU]
  Filled 2017-12-18: qty 5

## 2017-12-18 MED ORDER — DEXAMETHASONE SODIUM PHOSPHATE 100 MG/10ML IJ SOLN: 4.0000 mg | Freq: Once | INTRAMUSCULAR | Status: DC

## 2017-12-18 MED ORDER — DEXAMETHASONE SODIUM PHOSPHATE 10 MG/ML IJ SOLN
INTRAMUSCULAR | Status: AC
  Filled 2017-12-18: qty 1

## 2017-12-18 MED ORDER — DEXAMETHASONE SODIUM PHOSPHATE 10 MG/ML IJ SOLN
4.0000 mg | Freq: Once | INTRAMUSCULAR | Status: AC
  Administered 2017-12-18: 4 mg via INTRAVENOUS

## 2017-12-18 MED ORDER — SODIUM CHLORIDE 0.9% FLUSH
10.0000 mL | INTRAVENOUS | Status: DC | PRN
  Administered 2017-12-18: 10 mL
  Filled 2017-12-18: qty 10

## 2017-12-18 MED ORDER — ONDANSETRON HCL 4 MG/2ML IJ SOLN
4.0000 mg | Freq: Once | INTRAMUSCULAR | Status: AC
  Administered 2017-12-18: 4 mg via INTRAVENOUS

## 2017-12-18 MED ORDER — ONDANSETRON HCL 4 MG/2ML IJ SOLN
INTRAMUSCULAR | Status: AC
  Filled 2017-12-18: qty 2

## 2017-12-18 MED ORDER — PEGFILGRASTIM 6 MG/0.6ML ~~LOC~~ PSKT
6.0000 mg | PREFILLED_SYRINGE | Freq: Once | SUBCUTANEOUS | Status: AC
  Administered 2017-12-18: 6 mg via SUBCUTANEOUS
  Filled 2017-12-18: qty 0.6

## 2017-12-18 NOTE — Patient Instructions (Signed)
[facility] Discharge Instructions for Patients Receiving Chemotherapy   Beginning January 23rd 2017 lab work for the Cancer Center will be done in the  Main lab at [place] on 1st floor. If you have a lab appointment with the Cancer Center please come in thru the  Main Entrance and check in at the main information desk   Today you received the following chemotherapy agents   To help prevent nausea and vomiting after your treatment, we encourage you to take your nausea medication     If you develop nausea and vomiting, or diarrhea that is not controlled by your medication, call the clinic.  The clinic phone number is [phone]. Office hours are Monday-Friday 8:30am-5:00pm.  BELOW ARE SYMPTOMS THAT SHOULD BE REPORTED IMMEDIATELY:  *FEVER GREATER THAN 101.0 F  *CHILLS WITH OR WITHOUT FEVER  NAUSEA AND VOMITING THAT IS NOT CONTROLLED WITH YOUR NAUSEA MEDICATION  *UNUSUAL SHORTNESS OF BREATH  *UNUSUAL BRUISING OR BLEEDING  TENDERNESS IN MOUTH AND THROAT WITH OR WITHOUT PRESENCE OF ULCERS  *URINARY PROBLEMS  *BOWEL PROBLEMS  UNUSUAL RASH Items with * indicate a potential emergency and should be followed up as soon as possible. If you have an emergency after office hours please contact your primary care physician or go to the nearest emergency department.  Please call the clinic during office hours if you have any questions or concerns.   You may also contact the Patient Navigator at [phone] should you have any questions or need assistance in obtaining follow up care.      Resources For Cancer Patients and their Caregivers ? American Cancer Society: Can assist with transportation, wigs, general needs, runs Look Good Feel Better.        1-888-227-6333 ? Cancer Care: Provides financial assistance, online support groups, medication/co-pay assistance.  1-800-813-HOPE (4673) ? [facility] Assists [place] cancer  patients and their families through emotional , educational and financial support.  336-427-4357 ? [place] DSS Where to apply for food stamps, Medicaid and utility assistance. 336-342-1394 ? [employer]: Transportation to medical appointments. 336-347-2287 ? Social Security Administration: May apply for disability if have a Stage IV cancer. 336-342-7796 1-800-772-1213 ? [place] Aging, Disability and Transit Services: Assists with nutrition, care and transit needs. [phone]         

## 2017-12-18 NOTE — Progress Notes (Signed)
Nutrition Follow-up:  Patient with extensive small cell lung cancer.  Patient receiving topotecan.    Met with patient during infusion today.  Patient reports appetite is better after having 3 weeks off between treatments.  Reports that yesterday was able to drink 2 ensure and 12 oz smoothie (RD had given recipes last visit), carnation instant breakfast, gatorade, beef and bean burrito and 400 calorie lasagna frozen meal.  Reports that he is trying to eat high calorie, high protein easy to prepare meals.  Reports ensure makes his bowels move which is a good thing as he has been constipated.    No other nutrition impact symptoms reported at this time.     Medications: reviewed  Labs: reviewed  Anthropometrics:   Noted weight 148 lb 12.8 oz on 6/5 slight decrease from 149 lb 3.2 oz on 5/10. 167 lb on 3/5.   NUTRITION DIAGNOSIS: Malnutrition continues   MALNUTRITION DIAGNOSIS: severe malnutrition continues   INTERVENTION:   Encouraged patient to continue to drink oral nutrition supplements for additional calories and protein.  Provided with 2nd complimentary case of ensure enlive today and coupons. Reviewed high calorie, high protein foods Discussed importance of taking nausea medications during this next week following chemo to prevent decrease in nutrition.  Patient verbalized understanding.      MONITORING, EVALUATION, GOAL: weight trends, intake   NEXT VISIT: as needed  Joli B. Allen, RD, LDN Registered Dietitian [phone] (pager)     

## 2017-12-18 NOTE — Progress Notes (Signed)
Treatment given per orders. Patient tolerated it well without problems. Vitals stable and discharged home from clinic ambulatory. Follow up as scheduled.   David KitchenHarvie Junior arrived today for [facility] neulasta on body injector. See MAR for administration details. Injector in place and engaged with green light indicator on flashing. Tolerated application with out problems.[name]

## 2018-01-06 ENCOUNTER — Ambulatory Visit (HOSPITAL_COMMUNITY)
Admission: RE | Admit: 2018-01-06 | Discharge: 2018-01-06 | Disposition: A | Discharge status: Home / Self Care | Payer: Medicare PPO | Source: Ambulatory Visit | Attending: Hematology | Admitting: Hematology

## 2018-01-06 DIAGNOSIS — C3490 Malignant neoplasm of unspecified part of unspecified bronchus or lung: Secondary | ICD-10-CM | POA: Insufficient documentation

## 2018-01-06 DIAGNOSIS — R918 Other nonspecific abnormal finding of lung field: Secondary | ICD-10-CM | POA: Insufficient documentation

## 2018-01-06 MED ORDER — IOPAMIDOL (ISOVUE-300) INJECTION 61%
100.0000 mL | Freq: Once | INTRAVENOUS | Status: AC | PRN
  Administered 2018-01-06: 100 mL via INTRAVENOUS

## 2018-01-07 ENCOUNTER — Other Ambulatory Visit: Payer: Self-pay | Admitting: Family Medicine

## 2018-01-07 ENCOUNTER — Other Ambulatory Visit (HOSPITAL_COMMUNITY): Payer: Self-pay | Admitting: Hematology

## 2018-01-07 DIAGNOSIS — G893 Neoplasm related pain (acute) (chronic): Secondary | ICD-10-CM

## 2018-01-07 DIAGNOSIS — C3490 Malignant neoplasm of unspecified part of unspecified bronchus or lung: Secondary | ICD-10-CM

## 2018-01-07 NOTE — Telephone Encounter (Signed)
Requesting refill    Flexeril & Xanax  LOV: 05/19/17  LRF:  12/16/17

## 2018-01-11 ENCOUNTER — Encounter (HOSPITAL_COMMUNITY): Payer: Self-pay

## 2018-01-11 ENCOUNTER — Inpatient Hospital Stay (HOSPITAL_COMMUNITY): Payer: Medicare PPO | Attending: Hematology | Admitting: Hematology

## 2018-01-11 ENCOUNTER — Encounter (HOSPITAL_COMMUNITY): Payer: Self-pay | Admitting: Hematology

## 2018-01-11 ENCOUNTER — Inpatient Hospital Stay (HOSPITAL_COMMUNITY): Payer: Medicare PPO

## 2018-01-11 ENCOUNTER — Inpatient Hospital Stay (HOSPITAL_COMMUNITY): Payer: Medicare PPO | Attending: Hematology

## 2018-01-11 VITALS — BP 119/61 | HR 68 | Temp 97.7°F | Resp 18

## 2018-01-11 VITALS — BP 138/61 | HR 81 | Temp 97.6°F | Resp 18 | Wt 144.6 lb

## 2018-01-11 DIAGNOSIS — C787 Secondary malignant neoplasm of liver and intrahepatic bile duct: Secondary | ICD-10-CM | POA: Insufficient documentation

## 2018-01-11 DIAGNOSIS — C3490 Malignant neoplasm of unspecified part of unspecified bronchus or lung: Secondary | ICD-10-CM

## 2018-01-11 DIAGNOSIS — T451X5A Adverse effect of antineoplastic and immunosuppressive drugs, initial encounter: Secondary | ICD-10-CM | POA: Diagnosis not present

## 2018-01-11 DIAGNOSIS — R197 Diarrhea, unspecified: Secondary | ICD-10-CM | POA: Diagnosis not present

## 2018-01-11 DIAGNOSIS — K5900 Constipation, unspecified: Secondary | ICD-10-CM | POA: Diagnosis not present

## 2018-01-11 DIAGNOSIS — Z5111 Encounter for antineoplastic chemotherapy: Secondary | ICD-10-CM | POA: Insufficient documentation

## 2018-01-11 DIAGNOSIS — D649 Anemia, unspecified: Secondary | ICD-10-CM | POA: Insufficient documentation

## 2018-01-11 DIAGNOSIS — G893 Neoplasm related pain (acute) (chronic): Secondary | ICD-10-CM | POA: Diagnosis not present

## 2018-01-11 DIAGNOSIS — G620 Drug-induced polyneuropathy: Secondary | ICD-10-CM | POA: Insufficient documentation

## 2018-01-11 DIAGNOSIS — C770 Secondary and unspecified malignant neoplasm of lymph nodes of head, face and neck: Secondary | ICD-10-CM | POA: Diagnosis not present

## 2018-01-11 LAB — CBC WITH DIFFERENTIAL/PLATELET
Basophils Absolute: 0 10*3/uL (ref 0.0–0.1)
Basophils Relative: 0 %
Eosinophils Absolute: 0.1 10*3/uL (ref 0.0–0.7)
Eosinophils Relative: 1 %
HCT: 33.5 % — ABNORMAL LOW (ref 39.0–52.0)
Hemoglobin: 10.8 g/dL — ABNORMAL LOW (ref 13.0–17.0)
Lymphocytes Relative: 14 %
Lymphs Abs: 1.5 10*3/uL (ref 0.7–4.0)
MCH: 33.4 pg (ref 26.0–34.0)
MCHC: 32.2 g/dL (ref 30.0–36.0)
MCV: 103.7 fL — ABNORMAL HIGH (ref 78.0–100.0)
Monocytes Absolute: 1.3 10*3/uL (ref 0.1–1.0)
Monocytes Relative: 11 %
Neutro Abs: 8 10*3/uL (ref 1.7–7.7)
Neutrophils Relative %: 74 %
Platelets: 375 10*3/uL (ref 150–400)
RBC: 3.23 MIL/uL — ABNORMAL LOW (ref 4.22–5.81)
RDW: 19 % — ABNORMAL HIGH (ref 11.5–15.5)
WBC: 10.9 10*3/uL — ABNORMAL HIGH (ref 4.0–10.5)

## 2018-01-11 LAB — COMPREHENSIVE METABOLIC PANEL
ALT: 7 U/L (ref 0–44)
AST: 14 U/L — ABNORMAL LOW (ref 15–41)
Albumin: 3.9 g/dL (ref 3.5–5.0)
Alkaline Phosphatase: 62 U/L (ref 38–126)
Anion gap: 8 (ref 5–15)
BUN: 18 mg/dL (ref 8–23)
CO2: 26 mmol/L (ref 22–32)
Calcium: 9.2 mg/dL (ref 8.9–10.3)
Chloride: 107 mmol/L (ref 98–111)
Creatinine, Ser: 0.85 mg/dL (ref 0.61–1.24)
GFR calc Af Amer: >60 mL/min (ref >60)
GFR calc non Af Amer: >60 mL/min (ref >60)
Glucose, Bld: 129 mg/dL — ABNORMAL HIGH (ref 70–99)
Potassium: 3.8 mmol/L (ref 3.5–5.1)
Sodium: 141 mmol/L (ref 135–145)
Total Bilirubin: 0.3 mg/dL (ref 0.3–1.2)
Total Protein: 6.9 g/dL (ref 6.5–8.1)

## 2018-01-11 MED ORDER — HYDROCODONE-ACETAMINOPHEN 5-325 MG PO TABS: 1.0000 | ORAL_TABLET | Freq: Four times a day (QID) | ORAL | 0 refills | Status: DC | PRN

## 2018-01-11 MED ORDER — ONDANSETRON HCL 4 MG/2ML IJ SOLN
4.0000 mg | Freq: Once | INTRAMUSCULAR | Status: AC
  Administered 2018-01-11: 4 mg via INTRAVENOUS
  Filled 2018-01-11: qty 2

## 2018-01-11 MED ORDER — SODIUM CHLORIDE 0.9 % IV SOLN
Freq: Once | INTRAVENOUS | Status: AC
  Administered 2018-01-11: 11:00:00 via INTRAVENOUS

## 2018-01-11 MED ORDER — SODIUM CHLORIDE 0.9% FLUSH
10.0000 mL | INTRAVENOUS | Status: DC | PRN
  Administered 2018-01-11: 10 mL
  Filled 2018-01-11: qty 10

## 2018-01-11 MED ORDER — DEXAMETHASONE SODIUM PHOSPHATE 10 MG/ML IJ SOLN
4.0000 mg | Freq: Once | INTRAMUSCULAR | Status: AC
  Administered 2018-01-11: 4 mg via INTRAVENOUS
  Filled 2018-01-11: qty 1

## 2018-01-11 MED ORDER — TOPOTECAN HCL CHEMO INJECTION 4 MG
1.2000 mg/m2 | Freq: Once | INTRAVENOUS | Status: AC
  Administered 2018-01-11: 2.3 mg via INTRAVENOUS
  Filled 2018-01-11: qty 2.3

## 2018-01-11 MED ORDER — SODIUM CHLORIDE 0.9 % IV SOLN: Freq: Once | INTRAVENOUS | Status: DC

## 2018-01-11 MED ORDER — SODIUM CHLORIDE 0.9 % IV SOLN: 4.0000 mg | Freq: Once | INTRAVENOUS | Status: DC

## 2018-01-11 MED ORDER — HEPARIN SOD (PORK) LOCK FLUSH 100 UNIT/ML IV SOLN: 500.0000 [IU] | Freq: Once | INTRAVENOUS | Status: DC | PRN

## 2018-01-11 NOTE — Assessment & Plan Note (Signed)
1.  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 4 weeks, cycle 6 on 12/14/2017. - CT scan on 01/06/2018 after 6 cycles of chemotherapy showed slight improvement in the lung lesions.  It can be considered more or less stable disease.  No new areas were seen. -After we cut back the dose and change the chemotherapy to every 4 weeks, he is tolerating it very well.  His energy levels have improved.  His blood counts are also better.  He will proceed with cycle 7 today.  We will see him back in 4 weeks.  2.  Normocytic anemia: Last Feraheme infusion was in 10/29/2017.  His anemia has improved since we changed his chemotherapy to every 4 weeks.  3.  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  We increased the gabapentin to 300 mg twice daily.  This has improved his symptoms.  He is able to button his shirts without problem.  4.  Nausea and vomiting: He is doing better in terms of nausea since we added Zofran to the premedications.

## 2018-01-11 NOTE — Patient Instructions (Signed)
[facility] Discharge Instructions for Patients Receiving Chemotherapy   Beginning January 23rd 2017 lab work for the [facility] will be done in the  Main lab at [facility] on 1st floor. If you have a lab appointment with the [facility] please come in thru the  Main Entrance and check in at the main information desk   Today you received the following chemotherapy agents Topotecan. Follow-up as scheduled. Call clinic for any questions or concerns  To help prevent nausea and vomiting after your treatment, we encourage you to take your nausea medication   If you develop nausea and vomiting, or diarrhea that is not controlled by your medication, call the clinic.  The clinic phone number is [phone]. Office hours are Monday-Friday 8:30am-5:00pm.  BELOW ARE SYMPTOMS THAT SHOULD BE REPORTED IMMEDIATELY:  *FEVER GREATER THAN 101.0 F  *CHILLS WITH OR WITHOUT FEVER  NAUSEA AND VOMITING THAT IS NOT CONTROLLED WITH YOUR NAUSEA MEDICATION  *UNUSUAL SHORTNESS OF BREATH  *UNUSUAL BRUISING OR BLEEDING  TENDERNESS IN MOUTH AND THROAT WITH OR WITHOUT PRESENCE OF ULCERS  *URINARY PROBLEMS  *BOWEL PROBLEMS  UNUSUAL RASH Items with * indicate a potential emergency and should be followed up as soon as possible. If you have an emergency after office hours please contact your primary care physician or go to the nearest emergency department.  Please call the clinic during office hours if you have any questions or concerns.   You may also contact the Patient Navigator at [phone] should you have any questions or need assistance in obtaining follow up care.      Resources For Cancer Patients and their Caregivers ? American Cancer Society: Can assist with transportation, wigs, general needs, runs Look Good Feel Better.        317-045-9847 ? Cancer Care: Provides financial assistance, online support groups, medication/co-pay assistance.  1-800-813-HOPE  307-521-7538) ? [facility] Assists [place] cancer patients and their families through emotional , educational and financial support.  (717) 858-9281 ? [place] DSS Where to apply for food stamps, Medicaid and utility assistance. 212-527-1458 ? RCATS: Transportation to medical appointments. 310-067-2112 ? Social Security Administration: May apply for disability if have a Stage IV cancer. 906-620-5677 617-274-2047 ? [employer], Disability and Transit Services: Assists with nutrition, care and transit needs. 757-451-2395

## 2018-01-11 NOTE — Progress Notes (Signed)
[id number] Labs reviewed with and pt seen by Dr. Delton Coombes and pt approved for Topotecan infusion today per MD                                                                  Harvie Junior tolerated Topotecan infusion well without complaints or incident. VSS upon discharge Pt discharged with portacath flushed and left accessed for use tomorrow. Pt discharged self ambulatory in satisfactory condition

## 2018-01-11 NOTE — Patient Instructions (Signed)
[facility] Discharge Instructions  You saw Dr. Katragadda today.   Thank you for choosing [facility] at [facility] to provide your oncology and hematology care.  To afford each patient quality time with our provider, please arrive at least 15 minutes before your scheduled appointment time.   If you have a lab appointment with the Cancer Center please come in thru the  Main Entrance and check in at the main information desk  You need to re-schedule your appointment should you arrive 10 or more minutes late.  We strive to give you quality time with our providers, and arriving late affects you and other patients whose appointments are after yours.  Also, if you no show three or more times for appointments you may be dismissed from the clinic at the providers discretion.     Again, thank you for choosing [facility].  Our hope is that these requests will decrease the amount of time that you wait before being seen by our physicians.       _____________________________________________________________  Should you have questions after your visit to [facility], please contact our office at [phone] between the hours of 8:30 a.m. and 4:30 p.m.  Voicemails left after 4:30 p.m. will not be returned until the following business day.  For prescription refill requests, have your pharmacy contact our office.       Resources For Cancer Patients and their Caregivers ? American Cancer Society: Can assist with transportation, wigs, general needs, runs Look Good Feel Better.        1-888-227-6333 ? Cancer Care: Provides financial assistance, online support groups, medication/co-pay assistance.  1-800-813-HOPE (4673) ? [facility] Assists [place] cancer patients and their families through emotional , educational and financial support.  336-427-4357 ? [place] DSS Where to apply for  food stamps, Medicaid and utility assistance. 336-342-1394 ? [employer]: Transportation to medical appointments. 336-347-2287 ? Social Security Administration: May apply for disability if have a Stage IV cancer. 336-342-7796 1-800-772-1213 ? [place] Aging, Disability and Transit Services: Assists with nutrition, care and transit needs. 336-349-2343  Cancer Center Support Programs:   > Cancer Support Group  2nd Tuesday of the month 1pm-2pm, Journey Room   > Creative Journey  3rd Tuesday of the month 1130am-1pm, Journey Room     

## 2018-01-11 NOTE — Progress Notes (Signed)
[name], [place] [address]   CLINIC:  Medical Oncology/Hematology  PCP:  [name], MD [address] 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 Bulky 5.4 cm right supraclavicular region malignant lymph node conglomeration with extracapsular extension. 2. Surrounding smaller abnormal right level 3 and level 5 lymph nodes, and the lymphadenopathy continues into the superior mediastinum, see Chest CT findings reported separately. 3. No other metastatic disease identified in the neck.      01/16/2017 Imaging    CT chest: IMPRESSION: 1. Extensive lymphadenopathy in the thorax and lower right cervical region, as discussed above. Primary differential considerations include lymphoma/leukemia or small cell carcinoma of the lung. Further evaluation a PET-CT could be considered to assess for additional sites of disease below the diaphragm if clinically appropriate. Additionally, ultrasound-guided biopsy of supraclavicular lymphadenopathy could be considered to establish a tissue diagnosis. 2. Indeterminate lesion in the periphery of segment 8 of the liver measuring 2.7 x 1.7 cm. Attention at time of follow-up PET-CT is recommended. 3. Aortic atherosclerosis, in addition to left main and 3 vessel coronary artery disease. Please note that although the presence of coronary artery calcium documents the presence of coronary artery disease, the severity of this disease and any potential stenosis cannot be assessed on this non-gated CT examination. Assessment for potential risk factor modification, dietary therapy or pharmacologic therapy may be warranted, if clinically indicated. 4. There are calcifications  of the aortic valve. Echocardiographic correlation for evaluation of potential valvular dysfunction may be warranted if clinically indicated. 5. Diffuse bronchial wall thickening with moderate centrilobular and paraseptal emphysema; imaging findings suggestive of underlying COPD.      02/03/2017 Initial Biopsy    (R) neck lymph node biopsy: SMALL CELL CARCINOMA (most likely lung primary).       02/03/2017 Miscellaneous    Port-a-cath attempted by IR; unable to place d/t enlarged SVC.       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 Staging for metastatic disease  EXAM: MRI HEAD WITHOUT AND WITH CONTRAST  TECHNIQUE: Multiplanar, multiecho pulse sequences of the brain and surrounding structures were obtained without and with intravenous contrast.  CONTRAST:  21m MULTIHANCE GADOBENATE DIMEGLUMINE 529 MG/ML IV SOLN  COMPARISON:  None.  FINDINGS: Brain: Negative for hydrocephalus. Cerebral volume normal for age. Small nonenhancing white matter hyperintensities consistent with mild chronic microvascular ischemia. No acute infarct. Negative for hemorrhage or mass or edema  Normal enhancement postcontrast infusion. No enhancing mass lesion. Leptomeningeal enhancement is normal.  Vascular: Normal arterial flow voids.  Normal venous enhancement  Skull and upper cervical spine: Negative  Sinuses/Orbits: Negative  Other: None  IMPRESSION: Negative for metastatic disease.  No acute abnormality.  Mild chronic white matter changes.      04/07/2017 Imaging     PET:  1. Marked reduction in size and metabolic activity of bulky RIGHT supraclavicular adenopathy mediastinal lymphadenopathy. 2. Residual moderate activity remains within small  RIGHT supraclavicular lymph node, RIGHT lower paratracheal lymph node and RIGHT hilar lymph node. 3. Resolution of prevascular and internal mammary mediastinal metastatic hypermetabolic activity. 4. Resolution of metabolic activity associated with solitary RIGHT hepatic lobe liver metastasis. 5. No evidence of disease progression. 6. 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      05/27/2017 Imaging    MRI brain w/ and w/o contrast IMPRESSION: 1. No metastatic disease identified. 2. Increased nonspecific cerebral white matter signal changes since August. These are most commonly small vessel disease related. 3. New right maxillary sinusitis. Benign appearing retention cysts in the nasopharynx with trace mastoid effusions.      06/12/2017 Imaging    PET-CT IMPRESSION: 1. There are two new hypermetabolic nodules identified within both lower lobes measuring up to 3.1 cm. The appearance is nonspecific and may be inflammatory/infectious in etiology. Pulmonary metastatic disease cannot be excluded and short-term follow-up imaging in 3 months is advised to reassess these nodules. 2. Stable appearance of mild hypermetabolic activity associated with right paratracheal and right hilar lymph nodes. 3. Decrease in FDG uptake associated with index right supraclavicular lymph node. 4. 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 5. Aortic Atherosclerosis (ICD10-I70.0) and Emphysema (ICD10-J43.9).      08/19/2017 -  Chemotherapy    The patient had pegfilgrastim (NEULASTA ONPRO KIT) injection 6 mg, 6 mg, Subcutaneous, Once, 7 of 9 cycles Administration: 6 mg (08/28/2017), 6 mg (09/18/2017), 6 mg (10/09/2017), 6 mg (11/02/2017), 6 mg (11/20/2017), 6 mg (12/18/2017) topotecan (HYCAMTIN) 2.9 mg in sodium chloride 0.9  % 100 mL chemo infusion, 1.5 mg/m2 = 2.9 mg, Intravenous,  Once, 7 of 9 cycles Dose modification: 1.2 mg/m2 (80 % of original dose 1.5 mg/m2, Cycle 4, Reason: Dose Not Tolerated) Administration: 2.9 mg (08/24/2017), 2.9 mg (08/25/2017), 2.9 mg (08/28/2017), 2.9 mg (08/26/2017), 2.9 mg (08/27/2017), 2.9 mg (09/14/2017), 2.9 mg (09/15/2017), 2.9 mg (09/16/2017), 2.9 mg (09/17/2017), 2.9 mg (09/18/2017), 2.9 mg (10/05/2017), 2.9 mg (10/06/2017), 2.9 mg (10/07/2017), 2.9 mg (10/08/2017), 2.9 mg (10/09/2017), 2.3 mg (10/26/2017), 2.3 mg (10/27/2017), 2.3 mg (10/28/2017), 2.3 mg (10/29/2017), 2.3 mg (11/02/2017), 2.3 mg (11/16/2017), 2.3 mg (11/17/2017), 2.3 mg (11/18/2017), 2.3 mg (11/19/2017), 2.3 mg (11/20/2017), 2.3 mg (12/14/2017), 2.3 mg (12/15/2017), 2.3 mg (12/16/2017), 2.3 mg (12/17/2017), 2.3 mg (12/18/2017) ondansetron (ZOFRAN) 4 mg in sodium chloride 0.9 % 50 mL IVPB, , Intravenous,  Once, 2 of 4 cycles  for chemotherapy treatment.         CANCER STAGING: Cancer Staging No matching staging information was found for the patient.   INTERVAL HISTORY:  [name] 67 y.o. male returns for routine follow-up and consideration for his 7th cycle of chemotherapy. Patient states his energy levels have improved since taking a week off from chemo. Patient still having pain in his back and taking the hydrocodone to relieve it some. Patient says the Neurontin 373m twice daily  has helped a lot with his numbness in his hands and he can now use them for daily activities. He still gets Sob with exertion. He has alternating diarrhea and constipation. Denies any bleeding but does bruise easier.  Patient denies nausea or vomiting. His weight remains stable due to intake of shakes. Denies and fevers or recent hospitalization. Overall, he tells me he has been feeling pretty well. Energy levels at 50%; and appetite at 25%. Overall, he  feels ready for next cycle of chemo today.     REVIEW OF SYSTEMS:  Review of Systems  Constitutional: Positive for  fatigue.  Respiratory: Positive for shortness of breath.   Gastrointestinal: Positive for constipation and diarrhea.  Neurological: Positive for numbness.  Hematological: Bruises/bleeds easily.  All other systems reviewed and are negative.    PAST MEDICAL/SURGICAL HISTORY:  Past Medical History:  Diagnosis Date  . Anxiety   . CAD (coronary artery disease)   . Cancer ([facility])    stage 4 small cell lung cancer   . COPD (chronic obstructive pulmonary disease) ([facility])   . Depression   . Dyspnea    increased exertion  . Feeling of chest tightness   . Heart palpitations   . History of chemotherapy   . Myocardial infarction ([facility])   . Osteopenia   . Panic attacks   . Smoker    Past Surgical History:  Procedure Laterality Date  . BACK SURGERY  12/24/2000   L5,S1  . CORONARY STENT PLACEMENT  2005   RCA & CX  . HERNIA REPAIR Right 1980's  . INGUINAL HERNIA REPAIR  12/1978   right side  . IR FLUORO GUIDE PORT INSERTION RIGHT  04/02/2017  . IR US GUIDE BX ASP/DRAIN  02/03/2017  . IR US GUIDE VASC ACCESS RIGHT  04/02/2017  . NM MYOCAR PERF WALL MOTION  09/07/2009   No ischemia; EF 51%  . SHOULDER SURGERY Left 08/2010  . SPINE SURGERY  2002   L5-S1     SOCIAL HISTORY:  Social History   Socioeconomic History  . Marital status: Single    Spouse name: Not on file  . Number of children: Not on file  . Years of education: Not on file  . Highest education level: Not on file  Occupational History  . Not on file  Social Needs  . Financial resource strain: Not on file  . Food insecurity:    Worry: Not on file    Inability: Not on file  . Transportation needs:    Medical: Not on file    Non-medical: Not on file  Tobacco Use  . Smoking status: Current Every Day Smoker    Packs/day: 1.00    Types: Cigarettes  . Smokeless tobacco: Never Used  Substance and Sexual Activity  . Alcohol use: Yes    Comment: occas  . Drug use: No  . Sexual activity: Not on file  Lifestyle  . Physical  activity:    Days per week: Not on file    Minutes per session: Not on file  . Stress: Not on file  Relationships  . Social connections:    Talks on phone: Not on file    Gets together: Not on file    Attends religious service: Not on file    Active member of club or organization: Not on file    Attends meetings of clubs or organizations: Not on file    Relationship status: Not on file  . Intimate partner violence:    Fear of current or ex partner: Not on file    Emotionally abused: Not on file    Physically abused: Not on file    Forced sexual activity: Not on file  Other Topics Concern  . Not on file  Social History Narrative  . Not on file    FAMILY HISTORY:  Family History  Problem Relation Age of Onset  . Heart attack Father   . Kidney disease Father  renal failure  . Heart failure Mother   . Heart attack Mother   . Cancer Brother   . Diabetes Brother   . Alcohol abuse Brother   . Diabetes Sister     CURRENT MEDICATIONS:  Outpatient Encounter Medications as of 01/11/2018  Medication Sig  . ALPRAZolam (XANAX) 1 MG tablet TAKE (1) TABLET BY MOUTH (4) TIMES DAILY.  Marland Kitchen atorvastatin (LIPITOR) 10 MG tablet TAKE 1 TABLET AT BEDTIME  (DISCONTINUE ATORVASTATIN 20MG)  . clotrimazole-betamethasone (LOTRISONE) cream Apply 1 application topically 2 (two) times daily.  . cyclobenzaprine (FLEXERIL) 5 MG tablet TAKE 1 TABLET THREE TIMES DAILY AS NEEDED FOR MUSCLE SPASMS.  . cyclobenzaprine (FLEXERIL) 5 MG tablet TAKE 1 TABLET THREE TIMES DAILY AS NEEDED FOR MUSCLE SPASMS.  . cyclobenzaprine (FLEXERIL) 5 MG tablet TAKE 1 TABLET THREE TIMES DAILY AS NEEDED FOR MUSCLE SPASMS.  Marland Kitchen dexamethasone (DECADRON) 4 MG tablet   . gabapentin (NEURONTIN) 300 MG capsule Take 1 capsule (300 mg total) by mouth 2 (two) times daily.  Marland Kitchen HYDROcodone-acetaminophen (NORCO) 5-325 MG tablet Take 1 tablet by mouth every 6 (six) hours as needed for moderate pain.  Marland Kitchen lidocaine-prilocaine (EMLA) cream  Apply to affected area once  . ondansetron (ZOFRAN) 8 MG tablet Take 1 tablet (8 mg total) by mouth 2 (two) times daily as needed. Start on the third day after cisplatin chemotherapy.  . Pegfilgrastim (NEULASTA ONPRO [place]) Inject into the skin. Every 21 days  . polyethylene glycol powder (GLYCOLAX/MIRALAX) powder Take 17 g by mouth daily.  . prochlorperazine (COMPAZINE) 10 MG tablet Take 1 tablet (10 mg total) by mouth every 6 (six) hours as needed (Nausea or vomiting).  . topotecan in sodium chloride 0.9 % 100 mL Inject into the vein once. Days 1-5 every 21 days  . traZODone (DESYREL) 100 MG tablet Take 400 mg by mouth at bedtime.  . [DISCONTINUED] HYDROcodone-acetaminophen (NORCO) 5-325 MG tablet Take 1 tablet by mouth every 6 (six) hours as needed for moderate pain.   No facility-administered encounter medications on file as of 01/11/2018.     ALLERGIES:  Allergies  Allergen Reactions  . Codeine Nausea Only  . Niaspan [Niacin Er]      PHYSICAL EXAM:  ECOG Performance status: 1  Vitals:   01/11/18 0956  BP: 138/61  Pulse: 81  Resp: 18  Temp: 97.6 F (36.4 C)  SpO2: 95%   Filed Weights   01/11/18 0956  Weight: 144 lb 9.6 oz (65.6 kg)    Physical Exam   LABORATORY DATA:  I have reviewed the labs as listed.  CBC    Component Value Date/Time   WBC 10.9 (H) 01/11/2018 0930   RBC 3.23 (L) 01/11/2018 0930   HGB 10.8 (L) 01/11/2018 0930   HCT 33.5 (L) 01/11/2018 0930   PLT 375 01/11/2018 0930   MCV 103.7 (H) 01/11/2018 0930   MCH 33.4 01/11/2018 0930   MCHC 32.2 01/11/2018 0930   RDW 19.0 (H) 01/11/2018 0930   LYMPHSABS 1.5 01/11/2018 0930   MONOABS 1.3 01/11/2018 0930   EOSABS 0.1 01/11/2018 0930   BASOSABS 0.0 01/11/2018 0930   CMP Latest Ref Rng & Units 01/11/2018 12/14/2017 11/16/2017  Glucose 70 - 99 mg/dL 129(H) 104(H) 113(H)  BUN 8 - 23 mg/dL 18 19 20   Creatinine 0.61 - 1.24 mg/dL 0.85 0.82 0.88  Sodium 135 - 145 mmol/L 141 138 137  Potassium 3.5 - 5.1 mmol/L  3.8 3.8 3.6  Chloride 98 - 111 mmol/L 107 106 105  CO2 22 - 32 mmol/L 26 25 24   Calcium 8.9 - 10.3 mg/dL 9.2 9.0 9.0  Total Protein 6.5 - 8.1 g/dL 6.9 6.6 6.6  Total Bilirubin 0.3 - 1.2 mg/dL 0.3 0.6 0.5  Alkaline Phos 38 - 126 U/L 62 60 75  AST 15 - 41 U/L 14(L) 13(L) 15  ALT 0 - 44 U/L 7 8(L) 11(L)       DIAGNOSTIC IMAGING:  I have independently reviewed the CT scan of the chest, abdomen and pelvis dated 01/06/2018 and agree with report.     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 4 weeks, cycle 6 on 12/14/2017. - CT scan on 01/06/2018 after 6 cycles of chemotherapy showed slight improvement in the lung lesions.  It can be considered more or less stable disease.  No new areas were seen. -After we cut back the dose and change the chemotherapy to every 4 weeks, he is tolerating it very well.  His energy levels have improved.  His blood counts are also better.  He will proceed with cycle 7 today.  We will see him back in 4 weeks.  2.  Normocytic anemia: Last Feraheme infusion was in 10/29/2017.  His anemia has improved since we changed his chemotherapy to every 4 weeks.  3.  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  We increased the gabapentin to 300 mg twice daily.  This has  improved his symptoms.  He is able to button his shirts without problem.  4.  Nausea and vomiting: He is doing better in terms of nausea since we added Zofran to the premedications.      Orders placed this encounter:  Orders Placed This Encounter  Procedures  . CBC with Differential/Platelet  . Comprehensive metabolic panel      Derek Jack, MD [place] [phone]

## 2018-01-12 ENCOUNTER — Inpatient Hospital Stay (HOSPITAL_COMMUNITY): Payer: Medicare PPO

## 2018-01-12 ENCOUNTER — Other Ambulatory Visit (HOSPITAL_COMMUNITY): Payer: Self-pay | Admitting: Pharmacist

## 2018-01-12 VITALS — BP 111/59 | HR 60 | Temp 97.6°F | Resp 18 | Wt 146.8 lb

## 2018-01-12 DIAGNOSIS — C787 Secondary malignant neoplasm of liver and intrahepatic bile duct: Secondary | ICD-10-CM | POA: Diagnosis not present

## 2018-01-12 DIAGNOSIS — Z5111 Encounter for antineoplastic chemotherapy: Secondary | ICD-10-CM | POA: Diagnosis not present

## 2018-01-12 DIAGNOSIS — C3490 Malignant neoplasm of unspecified part of unspecified bronchus or lung: Secondary | ICD-10-CM | POA: Diagnosis not present

## 2018-01-12 MED ORDER — SODIUM CHLORIDE 0.9 % IV SOLN: Freq: Once | INTRAVENOUS | Status: DC

## 2018-01-12 MED ORDER — TOPOTECAN HCL CHEMO INJECTION 4 MG
1.2000 mg/m2 | Freq: Once | INTRAVENOUS | Status: AC
  Administered 2018-01-12: 2.3 mg via INTRAVENOUS
  Filled 2018-01-12: qty 2.3

## 2018-01-12 MED ORDER — DEXAMETHASONE SODIUM PHOSPHATE 10 MG/ML IJ SOLN
4.0000 mg | Freq: Once | INTRAMUSCULAR | Status: AC
  Administered 2018-01-12: 4 mg via INTRAVENOUS
  Filled 2018-01-12: qty 1

## 2018-01-12 MED ORDER — ONDANSETRON HCL 4 MG/2ML IJ SOLN
4.0000 mg | Freq: Once | INTRAMUSCULAR | Status: AC
  Administered 2018-01-12: 4 mg via INTRAVENOUS
  Filled 2018-01-12: qty 2

## 2018-01-12 MED ORDER — SODIUM CHLORIDE 0.9 % IV SOLN: 4.0000 mg | Freq: Once | INTRAVENOUS | Status: DC

## 2018-01-12 MED ORDER — SODIUM CHLORIDE 0.9 % IV SOLN
Freq: Once | INTRAVENOUS | Status: AC
  Administered 2018-01-12: 12:00:00 via INTRAVENOUS

## 2018-01-12 NOTE — Patient Instructions (Signed)
[facility] Discharge Instructions for Patients Receiving Chemotherapy   Beginning January 23rd 2017 lab work for the Cancer Center will be done in the  Main lab at [facility] on 1st floor. If you have a lab appointment with the Cancer Center please come in thru the  Main Entrance and check in at the main information desk   Today you received the following chemotherapy agents   To help prevent nausea and vomiting after your treatment, we encourage you to take your nausea medication     If you develop nausea and vomiting, or diarrhea that is not controlled by your medication, call the clinic.  The clinic phone number is [phone]. Office hours are Monday-Friday 8:30am-5:00pm.  BELOW ARE SYMPTOMS THAT SHOULD BE REPORTED IMMEDIATELY:  *FEVER GREATER THAN 101.0 F  *CHILLS WITH OR WITHOUT FEVER  NAUSEA AND VOMITING THAT IS NOT CONTROLLED WITH YOUR NAUSEA MEDICATION  *UNUSUAL SHORTNESS OF BREATH  *UNUSUAL BRUISING OR BLEEDING  TENDERNESS IN MOUTH AND THROAT WITH OR WITHOUT PRESENCE OF ULCERS  *URINARY PROBLEMS  *BOWEL PROBLEMS  UNUSUAL RASH Items with * indicate a potential emergency and should be followed up as soon as possible. If you have an emergency after office hours please contact your primary care physician or go to the nearest emergency department.  Please call the clinic during office hours if you have any questions or concerns.   You may also contact the Patient Navigator at [phone] should you have any questions or need assistance in obtaining follow up care.      Resources For Cancer Patients and their Caregivers ? American Cancer Society: Can assist with transportation, wigs, general needs, runs Look Good Feel Better.        1-888-227-6333 ? Cancer Care: Provides financial assistance, online support groups, medication/co-pay assistance.  1-800-813-HOPE (4673) ? [facility] Assists [place] cancer  patients and their families through emotional , educational and financial support.  336-427-4357 ? [place] DSS Where to apply for food stamps, Medicaid and utility assistance. 336-342-1394 ? [facility]: Transportation to medical appointments. 336-347-2287 ? Social Security Administration: May apply for disability if have a Stage IV cancer. 336-342-7796 1-800-772-1213 ? [place] Aging, Disability and Transit Services: Assists with nutrition, care and transit needs. 336-349-2343         

## 2018-01-13 ENCOUNTER — Inpatient Hospital Stay (HOSPITAL_COMMUNITY): Payer: Medicare PPO

## 2018-01-13 VITALS — BP 119/65 | HR 64 | Temp 97.7°F | Resp 18

## 2018-01-13 DIAGNOSIS — G893 Neoplasm related pain (acute) (chronic): Secondary | ICD-10-CM

## 2018-01-13 DIAGNOSIS — C3490 Malignant neoplasm of unspecified part of unspecified bronchus or lung: Secondary | ICD-10-CM

## 2018-01-13 DIAGNOSIS — C787 Secondary malignant neoplasm of liver and intrahepatic bile duct: Secondary | ICD-10-CM | POA: Diagnosis not present

## 2018-01-13 DIAGNOSIS — Z5111 Encounter for antineoplastic chemotherapy: Secondary | ICD-10-CM | POA: Diagnosis not present

## 2018-01-13 MED ORDER — ONDANSETRON HCL 40 MG/20ML IJ SOLN: Freq: Once | INTRAMUSCULAR | Status: DC

## 2018-01-13 MED ORDER — ONDANSETRON HCL 40 MG/20ML IJ SOLN
Freq: Once | INTRAMUSCULAR | Status: AC
  Administered 2018-01-13: 12:00:00 via INTRAVENOUS
  Filled 2018-01-13: qty 2

## 2018-01-13 MED ORDER — TOPOTECAN HCL CHEMO INJECTION 4 MG
1.2000 mg/m2 | Freq: Once | INTRAVENOUS | Status: AC
  Administered 2018-01-13: 2.3 mg via INTRAVENOUS
  Filled 2018-01-13: qty 2.3

## 2018-01-13 MED ORDER — SODIUM CHLORIDE 0.9 % IV SOLN
Freq: Once | INTRAVENOUS | Status: AC
  Administered 2018-01-13: 12:00:00 via INTRAVENOUS

## 2018-01-13 MED ORDER — SODIUM CHLORIDE 0.9% FLUSH
10.0000 mL | INTRAVENOUS | Status: DC | PRN
  Administered 2018-01-13: 10 mL
  Filled 2018-01-13: qty 10

## 2018-01-13 MED ORDER — SODIUM CHLORIDE 0.9 % IV SOLN: 4.0000 mg | Freq: Once | INTRAVENOUS | Status: DC

## 2018-01-13 MED ORDER — HEPARIN SOD (PORK) LOCK FLUSH 100 UNIT/ML IV SOLN: 500.0000 [IU] | Freq: Once | INTRAVENOUS | Status: DC | PRN

## 2018-01-13 MED ORDER — HYDROCODONE-ACETAMINOPHEN 5-325 MG PO TABS: 1.0000 | ORAL_TABLET | Freq: Four times a day (QID) | ORAL | 0 refills | Status: DC | PRN

## 2018-01-13 NOTE — Patient Instructions (Signed)
[facility] Discharge Instructions for Patients Receiving Chemotherapy   Beginning January 23rd 2017 lab work for the Cancer Center will be done in the  Main lab at [facility] on 1st floor. If you have a lab appointment with the Cancer Center please come in thru the  Main Entrance and check in at the main information desk   Today you received the following chemotherapy agents   To help prevent nausea and vomiting after your treatment, we encourage you to take your nausea medication     If you develop nausea and vomiting, or diarrhea that is not controlled by your medication, call the clinic.  The clinic phone number is [phone]. Office hours are Monday-Friday 8:30am-5:00pm.  BELOW ARE SYMPTOMS THAT SHOULD BE REPORTED IMMEDIATELY:  *FEVER GREATER THAN 101.0 F  *CHILLS WITH OR WITHOUT FEVER  NAUSEA AND VOMITING THAT IS NOT CONTROLLED WITH YOUR NAUSEA MEDICATION  *UNUSUAL SHORTNESS OF BREATH  *UNUSUAL BRUISING OR BLEEDING  TENDERNESS IN MOUTH AND THROAT WITH OR WITHOUT PRESENCE OF ULCERS  *URINARY PROBLEMS  *BOWEL PROBLEMS  UNUSUAL RASH Items with * indicate a potential emergency and should be followed up as soon as possible. If you have an emergency after office hours please contact your primary care physician or go to the nearest emergency department.  Please call the clinic during office hours if you have any questions or concerns.   You may also contact the Patient Navigator at [phone] should you have any questions or need assistance in obtaining follow up care.      Resources For Cancer Patients and their Caregivers ? American Cancer Society: Can assist with transportation, wigs, general needs, runs Look Good Feel Better.        1-888-227-6333 ? Cancer Care: Provides financial assistance, online support groups, medication/co-pay assistance.  1-800-813-HOPE (4673) ? [facility] Assists [place] cancer  patients and their families through emotional , educational and financial support.  336-427-4357 ? [place] DSS Where to apply for food stamps, Medicaid and utility assistance. 336-342-1394 ? [facility]: Transportation to medical appointments. 336-347-2287 ? Social Security Administration: May apply for disability if have a Stage IV cancer. 336-342-7796 1-800-772-1213 ? [place] Aging, Disability and Transit Services: Assists with nutrition, care and transit needs. 336-349-2343         

## 2018-01-15 ENCOUNTER — Inpatient Hospital Stay (HOSPITAL_COMMUNITY): Payer: Medicare PPO

## 2018-01-15 VITALS — BP 119/59 | HR 68 | Temp 97.8°F | Resp 18 | Wt 147.0 lb

## 2018-01-15 DIAGNOSIS — C3490 Malignant neoplasm of unspecified part of unspecified bronchus or lung: Secondary | ICD-10-CM | POA: Diagnosis not present

## 2018-01-15 DIAGNOSIS — C787 Secondary malignant neoplasm of liver and intrahepatic bile duct: Secondary | ICD-10-CM | POA: Diagnosis not present

## 2018-01-15 DIAGNOSIS — Z5111 Encounter for antineoplastic chemotherapy: Secondary | ICD-10-CM | POA: Diagnosis not present

## 2018-01-15 MED ORDER — SODIUM CHLORIDE 0.9 % IV SOLN: 4.0000 mg | Freq: Once | INTRAVENOUS | Status: DC

## 2018-01-15 MED ORDER — SODIUM CHLORIDE 0.9 % IV SOLN
Freq: Once | INTRAVENOUS | Status: AC
  Administered 2018-01-15: 12:00:00 via INTRAVENOUS
  Filled 2018-01-15: qty 2

## 2018-01-15 MED ORDER — ONDANSETRON HCL 40 MG/20ML IJ SOLN: Freq: Once | INTRAMUSCULAR | Status: DC

## 2018-01-15 MED ORDER — PEGFILGRASTIM 6 MG/0.6ML ~~LOC~~ PSKT
6.0000 mg | PREFILLED_SYRINGE | Freq: Once | SUBCUTANEOUS | Status: AC
  Administered 2018-01-15: 6 mg via SUBCUTANEOUS
  Filled 2018-01-15: qty 0.6

## 2018-01-15 MED ORDER — HEPARIN SOD (PORK) LOCK FLUSH 100 UNIT/ML IV SOLN
500.0000 [IU] | Freq: Once | INTRAVENOUS | Status: AC | PRN
  Administered 2018-01-15: 500 [IU]
  Filled 2018-01-15: qty 5

## 2018-01-15 MED ORDER — TOPOTECAN HCL CHEMO INJECTION 4 MG
1.2000 mg/m2 | Freq: Once | INTRAVENOUS | Status: AC
  Administered 2018-01-15: 2.3 mg via INTRAVENOUS
  Filled 2018-01-15: qty 2.3

## 2018-01-15 MED ORDER — SODIUM CHLORIDE 0.9 % IV SOLN
Freq: Once | INTRAVENOUS | Status: AC
  Administered 2018-01-15: 12:00:00 via INTRAVENOUS

## 2018-01-15 NOTE — Progress Notes (Signed)
Tolerated infusion w/o adverse reaction.  Alert, in no distress.  Neulasta OnPro RUE engaged with green light blinking RUE.  Pt instructed of time to remove (5:20 p.m. Tomorrow) - verbalizes understanding. Discharged ambulatory.

## 2018-01-18 ENCOUNTER — Ambulatory Visit (HOSPITAL_COMMUNITY): Payer: Medicare PPO

## 2018-02-03 DIAGNOSIS — C3411 Malignant neoplasm of upper lobe, right bronchus or lung: Secondary | ICD-10-CM | POA: Diagnosis not present

## 2018-02-03 DIAGNOSIS — C7931 Secondary malignant neoplasm of brain: Secondary | ICD-10-CM | POA: Diagnosis not present

## 2018-02-03 DIAGNOSIS — R590 Localized enlarged lymph nodes: Secondary | ICD-10-CM | POA: Diagnosis not present

## 2018-02-08 ENCOUNTER — Encounter (HOSPITAL_COMMUNITY): Payer: Self-pay | Admitting: Hematology

## 2018-02-08 ENCOUNTER — Inpatient Hospital Stay (HOSPITAL_BASED_OUTPATIENT_CLINIC_OR_DEPARTMENT_OTHER): Payer: Medicare PPO | Admitting: Hematology

## 2018-02-08 ENCOUNTER — Inpatient Hospital Stay (HOSPITAL_COMMUNITY): Payer: Medicare PPO

## 2018-02-08 ENCOUNTER — Other Ambulatory Visit: Payer: Self-pay

## 2018-02-08 VITALS — BP 137/53 | HR 75 | Temp 97.4°F | Resp 16 | Wt 148.4 lb

## 2018-02-08 VITALS — BP 123/59 | HR 65 | Temp 97.4°F | Resp 16

## 2018-02-08 DIAGNOSIS — T451X5A Adverse effect of antineoplastic and immunosuppressive drugs, initial encounter: Secondary | ICD-10-CM

## 2018-02-08 DIAGNOSIS — C3490 Malignant neoplasm of unspecified part of unspecified bronchus or lung: Secondary | ICD-10-CM | POA: Diagnosis not present

## 2018-02-08 DIAGNOSIS — R112 Nausea with vomiting, unspecified: Secondary | ICD-10-CM

## 2018-02-08 DIAGNOSIS — D649 Anemia, unspecified: Secondary | ICD-10-CM | POA: Diagnosis not present

## 2018-02-08 DIAGNOSIS — G893 Neoplasm related pain (acute) (chronic): Secondary | ICD-10-CM

## 2018-02-08 DIAGNOSIS — Z5111 Encounter for antineoplastic chemotherapy: Secondary | ICD-10-CM | POA: Diagnosis not present

## 2018-02-08 DIAGNOSIS — F17210 Nicotine dependence, cigarettes, uncomplicated: Secondary | ICD-10-CM | POA: Diagnosis not present

## 2018-02-08 DIAGNOSIS — C787 Secondary malignant neoplasm of liver and intrahepatic bile duct: Secondary | ICD-10-CM

## 2018-02-08 DIAGNOSIS — G620 Drug-induced polyneuropathy: Secondary | ICD-10-CM | POA: Diagnosis not present

## 2018-02-08 LAB — CBC WITH DIFFERENTIAL/PLATELET
Basophils Absolute: 0 10*3/uL (ref 0.0–0.1)
Basophils Relative: 0 %
Eosinophils Absolute: 0.2 10*3/uL (ref 0.0–0.7)
Eosinophils Relative: 2 %
HCT: 34.8 % — ABNORMAL LOW (ref 39.0–52.0)
Hemoglobin: 11.4 g/dL — ABNORMAL LOW (ref 13.0–17.0)
Lymphocytes Relative: 16 %
Lymphs Abs: 1.8 10*3/uL (ref 0.7–4.0)
MCH: 32.9 pg (ref 26.0–34.0)
MCHC: 32.8 g/dL (ref 30.0–36.0)
MCV: 100.3 fL — ABNORMAL HIGH (ref 78.0–100.0)
Monocytes Absolute: 0.8 10*3/uL (ref 0.1–1.0)
Monocytes Relative: 8 %
Neutro Abs: 8.2 10*3/uL — ABNORMAL HIGH (ref 1.7–7.7)
Neutrophils Relative %: 74 %
Platelets: 296 10*3/uL (ref 150–400)
RBC: 3.47 MIL/uL — ABNORMAL LOW (ref 4.22–5.81)
RDW: 19.8 % — ABNORMAL HIGH (ref 11.5–15.5)
WBC: 11.1 10*3/uL — ABNORMAL HIGH (ref 4.0–10.5)

## 2018-02-08 LAB — COMPREHENSIVE METABOLIC PANEL
ALT: 11 U/L (ref 0–44)
AST: 14 U/L — ABNORMAL LOW (ref 15–41)
Albumin: 4 g/dL (ref 3.5–5.0)
Alkaline Phosphatase: 63 U/L (ref 38–126)
Anion gap: 5 (ref 5–15)
BUN: 19 mg/dL (ref 8–23)
CO2: 26 mmol/L (ref 22–32)
Calcium: 9.2 mg/dL (ref 8.9–10.3)
Chloride: 107 mmol/L (ref 98–111)
Creatinine, Ser: 0.81 mg/dL (ref 0.61–1.24)
GFR calc Af Amer: >60 mL/min (ref >60)
GFR calc non Af Amer: >60 mL/min (ref >60)
Glucose, Bld: 118 mg/dL — ABNORMAL HIGH (ref 70–99)
Potassium: 3.8 mmol/L (ref 3.5–5.1)
Sodium: 138 mmol/L (ref 135–145)
Total Bilirubin: 0.5 mg/dL (ref 0.3–1.2)
Total Protein: 6.7 g/dL (ref 6.5–8.1)

## 2018-02-08 MED ORDER — SODIUM CHLORIDE 0.9 % IV SOLN
Freq: Once | INTRAVENOUS | Status: AC
  Administered 2018-02-08: 11:00:00 via INTRAVENOUS
  Filled 2018-02-08: qty 2

## 2018-02-08 MED ORDER — SODIUM CHLORIDE 0.9% FLUSH
10.0000 mL | INTRAVENOUS | Status: DC | PRN
  Administered 2018-02-08: 10 mL
  Filled 2018-02-08: qty 10

## 2018-02-08 MED ORDER — TOPOTECAN HCL CHEMO INJECTION 4 MG
1.2000 mg/m2 | Freq: Once | INTRAVENOUS | Status: AC
  Administered 2018-02-08: 2.3 mg via INTRAVENOUS
  Filled 2018-02-08: qty 2.3

## 2018-02-08 MED ORDER — SODIUM CHLORIDE 0.9 % IV SOLN
Freq: Once | INTRAVENOUS | Status: AC
  Administered 2018-02-08: 10:00:00 via INTRAVENOUS

## 2018-02-08 NOTE — Patient Instructions (Signed)
[facility] Discharge Instructions for Patients Receiving Chemotherapy  Today you received the following chemotherapy agents [place]aza.    If you develop nausea and vomiting that is not controlled by your nausea medication, call the clinic.   BELOW ARE SYMPTOMS THAT SHOULD BE REPORTED IMMEDIATELY:  *FEVER GREATER THAN 100.5 F  *CHILLS WITH OR WITHOUT FEVER  NAUSEA AND VOMITING THAT IS NOT CONTROLLED WITH YOUR NAUSEA MEDICATION  *UNUSUAL SHORTNESS OF BREATH  *UNUSUAL BRUISING OR BLEEDING  TENDERNESS IN MOUTH AND THROAT WITH OR WITHOUT PRESENCE OF ULCERS  *URINARY PROBLEMS  *BOWEL PROBLEMS  UNUSUAL RASH Items with * indicate a potential emergency and should be followed up as soon as possible.  Feel free to call the clinic should you have any questions or concerns. The clinic phone number is [phone].  Please show the [facility] at check-in to the Emergency Department and triage nurse.

## 2018-02-08 NOTE — Assessment & Plan Note (Signed)
1.  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 4 weeks, cycle 6 on 12/14/2017. - CT scan on 01/06/2018 after 6 cycles of chemotherapy showed slight improvement in the lung lesions.  It can be considered more or less stable disease.  No new areas were seen. -After we cut back the dose and change the chemotherapy to every 4 weeks, he is tolerating it very well.  His energy levels have improved.  His blood counts are also better. -He may proceed with cycle 8 today with same dose levels.  He will come back in 4 weeks with a repeat CT scan of the chest, abdomen with contrast.  2.  Normocytic anemia: Last Feraheme infusion was in 10/29/2017.  His anemia has improved since we changed his chemotherapy to every 4 weeks.  3.  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  We increased the gabapentin to 300 mg twice daily.  Numbness in the fingertips has gotten better.  Numbness in the feet has been stable.  He is able to button his shirts without a problem.  4.  Nausea and vomiting: He is doing better in terms of nausea since we added Zofran to the premedications.  5.  Cancer/chemotherapy related pains: -he is taking hydrocodone 2 to 3 tablets/day for his hip and shoulder pains.  He also has some back pain.  This regimen is controlling his pain well.

## 2018-02-08 NOTE — Progress Notes (Signed)
Patient tolerated chemotherapy with no complaints voiced.  Port site clean and dry with no bruising or swelling noted at site.  Dressing intact and left accessed for tomorrow.  VSS with discharge and left ambulatory with no s/s of distress noted.

## 2018-02-08 NOTE — Progress Notes (Signed)
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 Bulky 5.4 cm right supraclavicular region malignant lymph node conglomeration with extracapsular extension. 2. Surrounding smaller abnormal right level 3 and level 5 lymph nodes, and the lymphadenopathy continues into the superior mediastinum, see Chest CT findings reported separately. 3. No other metastatic disease identified in the neck.      01/16/2017 Imaging    CT chest: IMPRESSION: 1. Extensive lymphadenopathy in the thorax and lower right cervical region, as discussed above. Primary differential considerations include lymphoma/leukemia or small cell carcinoma of the lung. Further evaluation a PET-CT could be considered to assess for additional sites of disease below the diaphragm if clinically appropriate. Additionally, ultrasound-guided biopsy of supraclavicular lymphadenopathy could be considered to establish a tissue diagnosis. 2. Indeterminate lesion in the periphery of segment 8 of the liver measuring 2.7 x 1.7 cm. Attention at time of follow-up PET-CT is recommended. 3. Aortic atherosclerosis, in addition to left main and 3 vessel coronary artery disease. Please note that although the presence of coronary artery calcium documents the presence of coronary artery disease, the severity of this disease and any potential stenosis cannot be assessed on this non-gated CT examination. Assessment for potential risk factor modification, dietary therapy or pharmacologic therapy may be warranted, if clinically indicated. 4. There are calcifications  of the aortic valve. Echocardiographic correlation for evaluation of potential valvular dysfunction may be warranted if clinically indicated. 5. Diffuse bronchial wall thickening with moderate centrilobular and paraseptal emphysema; imaging findings suggestive of underlying COPD.      02/03/2017 Initial Biopsy    (R) neck lymph node biopsy: SMALL CELL CARCINOMA (most likely lung primary).       02/03/2017 Miscellaneous    Port-a-cath attempted by IR; unable to place d/t enlarged SVC.       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 Staging for metastatic disease  EXAM: MRI HEAD WITHOUT AND WITH CONTRAST  TECHNIQUE: Multiplanar, multiecho pulse sequences of the brain and surrounding structures were obtained without and with intravenous contrast.  CONTRAST:  65m MULTIHANCE GADOBENATE DIMEGLUMINE 529 MG/ML IV SOLN  COMPARISON:  None.  FINDINGS: Brain: Negative for hydrocephalus. Cerebral volume normal for age. Small nonenhancing white matter hyperintensities consistent with mild chronic microvascular ischemia. No acute infarct. Negative for hemorrhage or mass or edema  Normal enhancement postcontrast infusion. No enhancing mass lesion. Leptomeningeal enhancement is normal.  Vascular: Normal arterial flow voids.  Normal venous enhancement  Skull and upper cervical spine: Negative  Sinuses/Orbits: Negative  Other: None  IMPRESSION: Negative for metastatic disease.  No acute abnormality.  Mild chronic white matter changes.      04/07/2017 Imaging     PET:  1. Marked reduction in size and metabolic activity of bulky RIGHT supraclavicular adenopathy mediastinal lymphadenopathy. 2. Residual moderate activity remains within small  RIGHT supraclavicular lymph node, RIGHT lower paratracheal lymph node and RIGHT hilar lymph node. 3. Resolution of prevascular and internal mammary mediastinal metastatic hypermetabolic activity. 4. Resolution of metabolic activity associated with solitary RIGHT hepatic lobe liver metastasis. 5. No evidence of disease progression. 6. 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      05/27/2017 Imaging    MRI brain w/ and w/o contrast IMPRESSION: 1. No metastatic disease identified. 2. Increased nonspecific cerebral white matter signal changes since August. These are most commonly small vessel disease related. 3. New right maxillary sinusitis. Benign appearing retention cysts in the nasopharynx with trace mastoid effusions.      06/12/2017 Imaging    PET-CT IMPRESSION: 1. There are two new hypermetabolic nodules identified within both lower lobes measuring up to 3.1 cm. The appearance is nonspecific and may be inflammatory/infectious in etiology. Pulmonary metastatic disease cannot be excluded and short-term follow-up imaging in 3 months is advised to reassess these nodules. 2. Stable appearance of mild hypermetabolic activity associated with right paratracheal and right hilar lymph nodes. 3. Decrease in FDG uptake associated with index right supraclavicular lymph node. 4. 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 5. Aortic Atherosclerosis (ICD10-I70.0) and Emphysema (ICD10-J43.9).      08/19/2017 -  Chemotherapy    The patient had pegfilgrastim (NEULASTA ONPRO KIT) injection 6 mg, 6 mg, Subcutaneous, Once, 8 of 9 cycles Administration: 6 mg (08/28/2017), 6 mg (09/18/2017), 6 mg (10/09/2017), 6 mg (11/02/2017), 6 mg (11/20/2017), 6 mg (12/18/2017), 6 mg (01/15/2018) topotecan (HYCAMTIN) 2.9 mg in  sodium chloride 0.9 % 100 mL chemo infusion, 1.5 mg/m2 = 2.9 mg, Intravenous,  Once, 8 of 9 cycles Dose modification: 1.2 mg/m2 (80 % of original dose 1.5 mg/m2, Cycle 4, Reason: Dose Not Tolerated) Administration: 2.9 mg (08/24/2017), 2.9 mg (08/25/2017), 2.9 mg (08/28/2017), 2.9 mg (08/26/2017), 2.9 mg (08/27/2017), 2.9 mg (09/14/2017), 2.9 mg (09/15/2017), 2.9 mg (09/16/2017), 2.9 mg (09/17/2017), 2.9 mg (09/18/2017), 2.9 mg (10/05/2017), 2.9 mg (10/06/2017), 2.9 mg (10/07/2017), 2.9 mg (10/08/2017), 2.9 mg (10/09/2017), 2.3 mg (10/26/2017), 2.3 mg (10/27/2017), 2.3 mg (10/28/2017), 2.3 mg (10/29/2017), 2.3 mg (11/02/2017), 2.3 mg (11/16/2017), 2.3 mg (11/17/2017), 2.3 mg (11/18/2017), 2.3 mg (11/19/2017), 2.3 mg (11/20/2017), 2.3 mg (12/14/2017), 2.3 mg (12/15/2017), 2.3 mg (12/16/2017), 2.3 mg (12/17/2017), 2.3 mg (12/18/2017), 2.3 mg (01/11/2018), 2.3 mg (01/12/2018), 2.3 mg (01/13/2018), 2.3 mg (01/15/2018) ondansetron (ZOFRAN) 4 mg in sodium chloride 0.9 % 50 mL IVPB, , Intravenous,  Once, 3 of 4 cycles  for chemotherapy treatment.         INTERVAL HISTORY:  [name] 67 y.o. Greer returns for routine follow-up for small cell lung cancer and consideration of his next round of chemotherapy. Patient states he is feeling good since he had a longer break with his treatment. Patient alternates with the constipation and diarrhea. The numbness is better in his hand but not in his feet. He states he can feel more and is able to button his own shirt now. Patient is drinking 2 boosts daily to help maintain his weight. Patient appetite is down he doesn't get hungry like normal. He trys to eat 4-5 small meals a day despite his appetite. Patient has back pain and takes 2-3 hydrocodones daily depending on how bad his pain is. Denies any new pains. Denies any nausea or vomiting.  REVIEW OF SYSTEMS:  Review of Systems  Constitutional: Positive for fatigue.  HENT:  Negative.   Eyes: Negative.   Respiratory: Positive for shortness of breath.    Gastrointestinal: Positive for constipation and diarrhea.  Endocrine: Negative.   Genitourinary: Negative.    Musculoskeletal: Negative.   Skin: Negative.   Neurological: Positive for numbness.  Hematological: Negative.   Psychiatric/Behavioral: Negative.      PAST MEDICAL/SURGICAL HISTORY:  Past Medical History:  Diagnosis Date  . Anxiety   . CAD (coronary artery disease)   . Cancer ([place])    stage 4 small cell lung cancer   . COPD (chronic obstructive pulmonary disease) ([place])   . Depression   . Dyspnea    increased exertion  . Feeling of chest tightness   . Heart palpitations   . History of chemotherapy   . Myocardial infarction ([place])   . Osteopenia   . Panic attacks   . Smoker    Past Surgical History:  Procedure Laterality Date  . BACK SURGERY  12/24/2000   L5,S1  . CORONARY STENT PLACEMENT  2005   RCA & CX  . HERNIA REPAIR Right 1980's  . INGUINAL HERNIA REPAIR  12/1978   right side  . IR FLUORO GUIDE PORT INSERTION RIGHT  04/02/2017  . IR US GUIDE BX ASP/DRAIN  02/03/2017  . IR US GUIDE VASC ACCESS RIGHT  04/02/2017  . NM MYOCAR PERF WALL MOTION  09/07/2009   No ischemia; EF 51%  . SHOULDER SURGERY Left 08/2010  . SPINE SURGERY  2002   L5-S1     SOCIAL HISTORY:  Social History   Socioeconomic History  . Marital status: Single    Spouse name: Not on file  . Number of children: Not on file  . Years of education: Not on file  . Highest education level: Not on file  Occupational History  . Not on file  Social Needs  . Financial resource strain: Not on file  . Food insecurity:    Worry: Not on file    Inability: Not on file  . Transportation needs:    Medical: Not on file    Non-medical: Not on file  Tobacco Use  . Smoking status: Current Every Day Smoker    Packs/day: 1.00    Types: Cigarettes  . Smokeless tobacco: Never Used  Substance and Sexual Activity  . Alcohol use: Yes    Comment: occas  . Drug use: No  . Sexual activity: Not on file   Lifestyle  . Physical activity:    Days per week: Not on file    Minutes per session: Not on file  . Stress: Not on file  Relationships  . Social connections:    Talks on phone: Not on file    Gets together: Not on file    Attends religious service: Not on file    Active member of club or organization: Not on file    Attends meetings of clubs or organizations: Not on file    Relationship status: Not on file  . Intimate partner violence:    Fear of current or ex partner: Not on file    Emotionally abused: Not on file    Physically abused: Not on file    Forced sexual activity: Not on file  Other Topics Concern  . Not on file  Social History Narrative  . Not on file    FAMILY HISTORY:  Family History  Problem Relation Age of Onset  .  Heart attack Father   . Kidney disease Father        renal failure  . Heart failure Mother   . Heart attack Mother   . Cancer Brother   . Diabetes Brother   . Alcohol abuse Brother   . Diabetes Sister     CURRENT MEDICATIONS:  Outpatient Encounter Medications as of 02/08/2018  Medication Sig  . ALPRAZolam (XANAX) 1 MG tablet TAKE (1) TABLET BY MOUTH (4) TIMES DAILY.  Mar[place] Kitchen atorvastatin (LIPITOR) 10 MG tablet TAKE 1 TABLET AT BEDTIME  (DISCONTINUE ATORVASTATIN 20MG)  . clotrimazole-betamethasone (LOTRISONE) cream Apply 1 application topically 2 (two) times daily.  . cyclobenzaprine (FLEXERIL) 5 MG tablet TAKE 1 TABLET THREE TIMES DAILY AS NEEDED FOR MUSCLE SPASMS.  Marland Kitchen dexamethasone (DECADRON) 4 MG tablet   . gabapentin (NEURONTIN) 300 MG capsule Take 1 capsule (300 mg total) by mouth 2 (two) times daily.  Marland Kitchen HYDROcodone-acetaminophen (NORCO) 5-325 MG tablet Take 1 tablet by mouth every 6 (six) hours as needed for moderate pain.  Marland Kitchen lidocaine-prilocaine (EMLA) cream Apply to affected area once  . ondansetron (ZOFRAN) 8 MG tablet Take 1 tablet (8 mg total) by mouth 2 (two) times daily as needed. Start on the third day after cisplatin chemotherapy.   . Pegfilgrastim (NEULASTA ONPRO [place]) Inject into the skin. Every 21 days  . polyethylene glycol powder (GLYCOLAX/MIRALAX) powder Take 17 g by mouth daily.  . prochlorperazine (COMPAZINE) 10 MG tablet Take 1 tablet (10 mg total) by mouth every 6 (six) hours as needed (Nausea or vomiting).  . TOPOTECAN HCL IV Inject into the vein.  Marland Kitchen topotecan in sodium chloride 0.9 % 100 mL Inject into the vein once. Days 1-5 every 21 days  . [DISCONTINUED] traZODone (DESYREL) 100 MG tablet Take 400 mg by mouth at bedtime.  . [DISCONTINUED] cyclobenzaprine (FLEXERIL) 5 MG tablet TAKE 1 TABLET THREE TIMES DAILY AS NEEDED FOR MUSCLE SPASMS.  . [DISCONTINUED] cyclobenzaprine (FLEXERIL) 5 MG tablet TAKE 1 TABLET THREE TIMES DAILY AS NEEDED FOR MUSCLE SPASMS.   No facility-administered encounter medications on file as of 02/08/2018.     ALLERGIES:  Allergies  Allergen Reactions  . Codeine Nausea Only  . Niaspan [Niacin Er]      PHYSICAL EXAM:  ECOG Performance status: 1  Vitals:   02/08/18 0934  BP: (!) 137/53  Pulse: 75  Resp: 16  Temp: (!) 97.4 F (36.3 C)  SpO2: 99%   Filed Weights   02/08/18 0934  Weight: 148 lb 6.4 oz (67.3 kg)    Physical Exam  Constitutional: He is oriented to person, place, and time.  Cardiovascular: Normal rate, regular rhythm and normal heart sounds.  Pulmonary/Chest: Effort normal. He has wheezes.  Neurological: He is alert and oriented to person, place, and time.  Skin: Skin is warm and dry.     LABORATORY DATA:  I have reviewed the labs as listed.  CBC    Component Value Date/Time   WBC 11.1 (H) 02/08/2018 0834   RBC 3.47 (L) 02/08/2018 0834   HGB 11.4 (L) 02/08/2018 0834   HCT 34.8 (L) 02/08/2018 0834   PLT 296 02/08/2018 0834   MCV 100.3 (H) 02/08/2018 0834   MCH 32.9 02/08/2018 0834   MCHC 32.8 02/08/2018 0834   RDW 19.8 (H) 02/08/2018 0834   LYMPHSABS 1.8 02/08/2018 0834   MONOABS 0.8 02/08/2018 0834   EOSABS 0.2 02/08/2018 0834   BASOSABS 0.0  02/08/2018 0834   CMP Latest Ref Rng & Units 02/08/2018  01/11/2018 12/14/2017  Glucose 70 - 99 mg/dL 118(H) 129(H) 104(H)  BUN 8 - 23 mg/dL _0 Creatinine 0.61 - 1.24 mg/dL 0.81 0.85 0.82  Sodium 135 - 145 mmol/L 138 141 138  Potassium 3.5 - 5.1 mmol/L 3.8 3.8 3.8  Chloride 98 - 111 mmol/L 107 107 106  CO2 22 - 32 mmol/L _1 Calcium 8.9 - 10.3 mg/dL 9.2 9.2 9.0  Total Protein 6.5 - 8.1 g/dL 6.7 6.9 6.6  Total Bilirubin 0.3 - 1.2 mg/dL 0.5 0.3 0.6  Alkaline Phos 38 - 126 U/L 63 62 60  AST 15 - 41 U/L 14(L) 14(L) 13(L)  ALT 0 - 44 U/L 11 7 8(L)          ASSESSMENT & PLAN:   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 4 weeks, cycle 6 on 12/14/2017. - CT scan on 01/06/2018 after 6 cycles of chemotherapy showed slight improvement in the lung lesions.  It can be considered more or less stable disease.  No new areas were seen. -After we cut back the dose and change the chemotherapy to every 4 weeks, he is tolerating it very well.  His energy levels have improved.  His blood counts are also better. -He may proceed with cycle 8 today with same dose levels.  He will come back in 4 weeks with a repeat CT scan of the chest, abdomen with contrast.  2.  Normocytic anemia: Last Feraheme infusion was in 10/29/2017.  His anemia has improved since we changed his  chemotherapy to every 4 weeks.  3.  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  We increased the gabapentin to 300 mg twice daily.  Numbness in the fingertips has gotten better.  Numbness in the feet has been stable.  He is able to button his shirts without a problem.  4.  Nausea and vomiting: He is doing better in terms of nausea since we added Zofran to the premedications.  5.  Cancer/chemotherapy related pains: -he is taking hydrocodone 2 to 3 tablets/day for his hip and shoulder pains.  He also has some back pain.  This regimen is controlling his pain well.      Orders placed this encounter:  Orders Placed This Encounter  Procedures  . CT Chest W Contrast  . CT Abdomen Pelvis W Contrast  . CBC with Differential/Platelet  . Comprehensive metabolic panel      Derek Jack, MD [place] [phone]

## 2018-02-09 ENCOUNTER — Inpatient Hospital Stay (HOSPITAL_COMMUNITY): Payer: Medicare PPO

## 2018-02-09 ENCOUNTER — Encounter (HOSPITAL_COMMUNITY): Payer: Self-pay

## 2018-02-09 VITALS — BP 117/54 | HR 66 | Temp 98.0°F | Resp 18 | Wt 148.2 lb

## 2018-02-09 DIAGNOSIS — Z5111 Encounter for antineoplastic chemotherapy: Secondary | ICD-10-CM | POA: Diagnosis not present

## 2018-02-09 DIAGNOSIS — C787 Secondary malignant neoplasm of liver and intrahepatic bile duct: Secondary | ICD-10-CM | POA: Diagnosis not present

## 2018-02-09 DIAGNOSIS — C3490 Malignant neoplasm of unspecified part of unspecified bronchus or lung: Secondary | ICD-10-CM | POA: Diagnosis not present

## 2018-02-09 MED ORDER — SODIUM CHLORIDE 0.9 % IV SOLN
Freq: Once | INTRAVENOUS | Status: AC
  Administered 2018-02-09: 12:00:00 via INTRAVENOUS
  Filled 2018-02-09: qty 2

## 2018-02-09 MED ORDER — SODIUM CHLORIDE 0.9 % IV SOLN
Freq: Once | INTRAVENOUS | Status: AC
  Administered 2018-02-09: 11:00:00 via INTRAVENOUS

## 2018-02-09 MED ORDER — SODIUM CHLORIDE 0.9% FLUSH
10.0000 mL | INTRAVENOUS | Status: DC | PRN
  Administered 2018-02-09: 10 mL
  Filled 2018-02-09: qty 10

## 2018-02-09 MED ORDER — SODIUM CHLORIDE 0.9 % IV SOLN
1.2000 mg/m2 | Freq: Once | INTRAVENOUS | Status: AC
  Administered 2018-02-09: 2.3 mg via INTRAVENOUS
  Filled 2018-02-09: qty 2.3

## 2018-02-09 NOTE — Patient Instructions (Signed)
[facility] Discharge Instructions for Patients Receiving Chemotherapy  Today you received the following chemotherapy agents hycamtin.    If you develop nausea and vomiting that is not controlled by your nausea medication, call the clinic.   BELOW ARE SYMPTOMS THAT SHOULD BE REPORTED IMMEDIATELY:  *FEVER GREATER THAN 100.5 F  *CHILLS WITH OR WITHOUT FEVER  NAUSEA AND VOMITING THAT IS NOT CONTROLLED WITH YOUR NAUSEA MEDICATION  *UNUSUAL SHORTNESS OF BREATH  *UNUSUAL BRUISING OR BLEEDING  TENDERNESS IN MOUTH AND THROAT WITH OR WITHOUT PRESENCE OF ULCERS  *URINARY PROBLEMS  *BOWEL PROBLEMS  UNUSUAL RASH Items with * indicate a potential emergency and should be followed up as soon as possible.  Feel free to call the clinic should you have any questions or concerns. The clinic phone number is [phone].  Please show the [facility] at check-in to the Emergency Department and triage nurse.

## 2018-02-09 NOTE — Progress Notes (Signed)
Patient tolerated treatment with no complaints voiced.  Port site clean and dry with no bruising or swelling noted at site.  Flushed with good blood return noted before and after treatment.  VSS with discharge and left ambulatory with no s/s of distress noted.

## 2018-02-10 ENCOUNTER — Inpatient Hospital Stay (HOSPITAL_COMMUNITY): Payer: Medicare PPO

## 2018-02-10 VITALS — BP 126/54 | HR 59 | Temp 98.0°F | Resp 18

## 2018-02-10 DIAGNOSIS — Z5111 Encounter for antineoplastic chemotherapy: Secondary | ICD-10-CM | POA: Diagnosis not present

## 2018-02-10 DIAGNOSIS — C3490 Malignant neoplasm of unspecified part of unspecified bronchus or lung: Secondary | ICD-10-CM | POA: Diagnosis not present

## 2018-02-10 DIAGNOSIS — C787 Secondary malignant neoplasm of liver and intrahepatic bile duct: Secondary | ICD-10-CM | POA: Diagnosis not present

## 2018-02-10 MED ORDER — TOPOTECAN HCL CHEMO INJECTION 4 MG
1.2000 mg/m2 | Freq: Once | INTRAVENOUS | Status: AC
  Administered 2018-02-10: 2.3 mg via INTRAVENOUS
  Filled 2018-02-10: qty 2.3

## 2018-02-10 MED ORDER — SODIUM CHLORIDE 0.9 % IV SOLN
Freq: Once | INTRAVENOUS | Status: AC
  Administered 2018-02-10: 14:00:00 via INTRAVENOUS
  Filled 2018-02-10: qty 2

## 2018-02-10 MED ORDER — SODIUM CHLORIDE 0.9 % IV SOLN
Freq: Once | INTRAVENOUS | Status: AC
  Administered 2018-02-10: 14:00:00 via INTRAVENOUS

## 2018-02-10 NOTE — Progress Notes (Signed)
Patient presented today for chemo-day 3, no complaints from patient at this time. Will proceed per orders.   Treatment given per orders. Patient tolerated it well without problems. Vitals stable and discharged home from clinic ambulatory. Follow up as scheduled.

## 2018-02-10 NOTE — Patient Instructions (Signed)
[facility] Discharge Instructions for Patients Receiving Chemotherapy   Beginning January 23rd 2017 lab work for the Cancer Center will be done in the  Main lab at [facility] on 1st floor. If you have a lab appointment with the Cancer Center please come in thru the  Main Entrance and check in at the main information desk   Today you received the following chemotherapy agents   To help prevent nausea and vomiting after your treatment, we encourage you to take your nausea medication     If you develop nausea and vomiting, or diarrhea that is not controlled by your medication, call the clinic.  The clinic phone number is [phone]. Office hours are Monday-Friday 8:30am-5:00pm.  BELOW ARE SYMPTOMS THAT SHOULD BE REPORTED IMMEDIATELY:  *FEVER GREATER THAN 101.0 F  *CHILLS WITH OR WITHOUT FEVER  NAUSEA AND VOMITING THAT IS NOT CONTROLLED WITH YOUR NAUSEA MEDICATION  *UNUSUAL SHORTNESS OF BREATH  *UNUSUAL BRUISING OR BLEEDING  TENDERNESS IN MOUTH AND THROAT WITH OR WITHOUT PRESENCE OF ULCERS  *URINARY PROBLEMS  *BOWEL PROBLEMS  UNUSUAL RASH Items with * indicate a potential emergency and should be followed up as soon as possible. If you have an emergency after office hours please contact your primary care physician or go to the nearest emergency department.  Please call the clinic during office hours if you have any questions or concerns.   You may also contact the Patient Navigator at [phone] should you have any questions or need assistance in obtaining follow up care.      Resources For Cancer Patients and their Caregivers ? American Cancer Society: Can assist with transportation, wigs, general needs, runs Look Good Feel Better.        1-888-227-6333 ? Cancer Care: Provides financial assistance, online support groups, medication/co-pay assistance.  1-800-813-HOPE (4673) ? [facility] Assists [place] cancer  patients and their families through emotional , educational and financial support.  336-427-4357 ? [place] DSS Where to apply for food stamps, Medicaid and utility assistance. 336-342-1394 ? [facility]: Transportation to medical appointments. 336-347-2287 ? Social Security Administration: May apply for disability if have a Stage IV cancer. 336-342-7796 1-800-772-1213 ? [place] [facility], Disability and Transit Services: Assists with nutrition, care and transit needs. 336-349-2343         

## 2018-02-11 ENCOUNTER — Encounter (HOSPITAL_COMMUNITY): Payer: Self-pay

## 2018-02-11 ENCOUNTER — Inpatient Hospital Stay (HOSPITAL_COMMUNITY): Payer: Medicare PPO | Attending: Oncology

## 2018-02-11 VITALS — BP 129/61 | HR 65 | Temp 98.2°F | Resp 18

## 2018-02-11 DIAGNOSIS — C3490 Malignant neoplasm of unspecified part of unspecified bronchus or lung: Secondary | ICD-10-CM | POA: Insufficient documentation

## 2018-02-11 DIAGNOSIS — Z5111 Encounter for antineoplastic chemotherapy: Secondary | ICD-10-CM | POA: Insufficient documentation

## 2018-02-11 DIAGNOSIS — C770 Secondary and unspecified malignant neoplasm of lymph nodes of head, face and neck: Secondary | ICD-10-CM | POA: Insufficient documentation

## 2018-02-11 DIAGNOSIS — C787 Secondary malignant neoplasm of liver and intrahepatic bile duct: Secondary | ICD-10-CM | POA: Insufficient documentation

## 2018-02-11 MED ORDER — DEXAMETHASONE SODIUM PHOSPHATE 100 MG/10ML IJ SOLN
Freq: Once | INTRAMUSCULAR | Status: AC
  Administered 2018-02-11: 12:00:00 via INTRAVENOUS
  Filled 2018-02-11: qty 2

## 2018-02-11 MED ORDER — SODIUM CHLORIDE 0.9 % IV SOLN
Freq: Once | INTRAVENOUS | Status: AC
  Administered 2018-02-11: 12:00:00 via INTRAVENOUS

## 2018-02-11 MED ORDER — SODIUM CHLORIDE 0.9% FLUSH
10.0000 mL | INTRAVENOUS | Status: DC | PRN
  Administered 2018-02-11: 10 mL
  Filled 2018-02-11: qty 10

## 2018-02-11 MED ORDER — TOPOTECAN HCL CHEMO INJECTION 4 MG
1.2000 mg/m2 | Freq: Once | INTRAVENOUS | Status: AC
  Administered 2018-02-11: 2.3 mg via INTRAVENOUS
  Filled 2018-02-11: qty 2.3

## 2018-02-11 NOTE — Progress Notes (Signed)
[name] tolerated Topotecan infusion well without complaints or incident. VSS upon discharge. Port left accessed upon discharge. Pt discharged self ambulatory in satisfactory condition

## 2018-02-11 NOTE — Patient Instructions (Signed)
[facility] Discharge Instructions for Patients Receiving Chemotherapy   Beginning January 23rd 2017 lab work for the [facility] will be done in the  Main lab at [facility] on 1st floor. If you have a lab appointment with the [facility] please come in thru the  Main Entrance and check in at the main information desk   Today you received the following chemotherapy agents Topotecan. Follow-up as scheduled. Call clinic for any questions or concerns  To help prevent nausea and vomiting after your treatment, we encourage you to take your nausea medication   If you develop nausea and vomiting, or diarrhea that is not controlled by your medication, call the clinic.  The clinic phone number is [phone]. Office hours are Monday-Friday 8:30am-5:00pm.  BELOW ARE SYMPTOMS THAT SHOULD BE REPORTED IMMEDIATELY:  *FEVER GREATER THAN 101.0 F  *CHILLS WITH OR WITHOUT FEVER  NAUSEA AND VOMITING THAT IS NOT CONTROLLED WITH YOUR NAUSEA MEDICATION  *UNUSUAL SHORTNESS OF BREATH  *UNUSUAL BRUISING OR BLEEDING  TENDERNESS IN MOUTH AND THROAT WITH OR WITHOUT PRESENCE OF ULCERS  *URINARY PROBLEMS  *BOWEL PROBLEMS  UNUSUAL RASH Items with * indicate a potential emergency and should be followed up as soon as possible. If you have an emergency after office hours please contact your primary care physician or go to the nearest emergency department.  Please call the clinic during office hours if you have any questions or concerns.   You may also contact the Patient Navigator at [phone] should you have any questions or need assistance in obtaining follow up care.      Resources For Cancer Patients and their Caregivers ? American Cancer Society: Can assist with transportation, wigs, general needs, runs Look Good Feel Better.        317-045-9847 ? Cancer Care: Provides financial assistance, online support groups, medication/co-pay assistance.  1-800-813-HOPE  307-521-7538) ? [facility] Assists [place] cancer patients and their families through emotional , educational and financial support.  (717) 858-9281 ? [place] DSS Where to apply for food stamps, Medicaid and utility assistance. 212-527-1458 ? RCATS: Transportation to medical appointments. 310-067-2112 ? Social Security Administration: May apply for disability if have a Stage IV cancer. 906-620-5677 617-274-2047 ? [employer], Disability and Transit Services: Assists with nutrition, care and transit needs. 757-451-2395

## 2018-02-12 ENCOUNTER — Inpatient Hospital Stay (HOSPITAL_COMMUNITY): Payer: Medicare PPO

## 2018-02-12 ENCOUNTER — Encounter (HOSPITAL_COMMUNITY): Payer: Self-pay

## 2018-02-12 ENCOUNTER — Other Ambulatory Visit: Payer: Self-pay | Admitting: Physician Assistant

## 2018-02-12 VITALS — BP 114/63 | HR 66 | Temp 97.7°F | Resp 18 | Wt 146.2 lb

## 2018-02-12 DIAGNOSIS — C3490 Malignant neoplasm of unspecified part of unspecified bronchus or lung: Secondary | ICD-10-CM

## 2018-02-12 DIAGNOSIS — Z5111 Encounter for antineoplastic chemotherapy: Secondary | ICD-10-CM | POA: Diagnosis not present

## 2018-02-12 DIAGNOSIS — C787 Secondary malignant neoplasm of liver and intrahepatic bile duct: Secondary | ICD-10-CM | POA: Diagnosis not present

## 2018-02-12 DIAGNOSIS — C770 Secondary and unspecified malignant neoplasm of lymph nodes of head, face and neck: Secondary | ICD-10-CM | POA: Diagnosis not present

## 2018-02-12 MED ORDER — SODIUM CHLORIDE 0.9 % IV SOLN
Freq: Once | INTRAVENOUS | Status: AC
  Administered 2018-02-12: 12:00:00 via INTRAVENOUS
  Filled 2018-02-12: qty 2

## 2018-02-12 MED ORDER — HEPARIN SOD (PORK) LOCK FLUSH 100 UNIT/ML IV SOLN
INTRAVENOUS | Status: AC
  Filled 2018-02-12: qty 5

## 2018-02-12 MED ORDER — SODIUM CHLORIDE 0.9% FLUSH
10.0000 mL | INTRAVENOUS | Status: DC | PRN
  Administered 2018-02-12: 10 mL
  Filled 2018-02-12: qty 10

## 2018-02-12 MED ORDER — PEGFILGRASTIM 6 MG/0.6ML ~~LOC~~ PSKT
PREFILLED_SYRINGE | SUBCUTANEOUS | Status: AC
  Filled 2018-02-12: qty 0.6

## 2018-02-12 MED ORDER — HEPARIN SOD (PORK) LOCK FLUSH 100 UNIT/ML IV SOLN
500.0000 [IU] | Freq: Once | INTRAVENOUS | Status: AC | PRN
  Administered 2018-02-12: 500 [IU]

## 2018-02-12 MED ORDER — SODIUM CHLORIDE 0.9 % IV SOLN
Freq: Once | INTRAVENOUS | Status: AC
  Administered 2018-02-12: 12:00:00 via INTRAVENOUS

## 2018-02-12 MED ORDER — PEGFILGRASTIM 6 MG/0.6ML ~~LOC~~ PSKT
6.0000 mg | PREFILLED_SYRINGE | Freq: Once | SUBCUTANEOUS | Status: AC
  Administered 2018-02-12: 6 mg via SUBCUTANEOUS

## 2018-02-12 MED ORDER — SODIUM CHLORIDE 0.9 % IV SOLN
1.2000 mg/m2 | Freq: Once | INTRAVENOUS | Status: AC
  Administered 2018-02-12: 2.3 mg via INTRAVENOUS
  Filled 2018-02-12: qty 2.3

## 2018-02-12 NOTE — Patient Instructions (Addendum)
[facility] Discharge Instructions for Patients Receiving Chemotherapy   Beginning January 23rd 2017 lab work for the [facility] will be done in the  Main lab at [facility] on 1st floor. If you have a lab appointment with the [facility] please come in thru the  Main Entrance and check in at the main information desk   Today you received the following chemotherapy agents Topotecan as well as Neulasta on-pro. Follow-up as scheduled. Call clinic for any questions or concerns To help prevent nausea and vomiting after your treatment, we encourage you to take your nausea medication   If you develop nausea and vomiting, or diarrhea that is not controlled by your medication, call the clinic.  The clinic phone number is [phone]. Office hours are Monday-Friday 8:30am-5:00pm.  BELOW ARE SYMPTOMS THAT SHOULD BE REPORTED IMMEDIATELY:  *FEVER GREATER THAN 101.0 F  *CHILLS WITH OR WITHOUT FEVER  NAUSEA AND VOMITING THAT IS NOT CONTROLLED WITH YOUR NAUSEA MEDICATION  *UNUSUAL SHORTNESS OF BREATH  *UNUSUAL BRUISING OR BLEEDING  TENDERNESS IN MOUTH AND THROAT WITH OR WITHOUT PRESENCE OF ULCERS  *URINARY PROBLEMS  *BOWEL PROBLEMS  UNUSUAL RASH Items with * indicate a potential emergency and should be followed up as soon as possible. If you have an emergency after office hours please contact your primary care physician or go to the nearest emergency department.  Please call the clinic during office hours if you have any questions or concerns.   You may also contact the Patient Navigator at [phone] should you have any questions or need assistance in obtaining follow up care.      Resources For Cancer Patients and their Caregivers ? American Cancer Society: Can assist with transportation, wigs, general needs, runs Look Good Feel Better.        316-205-4063 ? Cancer Care: Provides financial assistance, online support groups, medication/co-pay  assistance.  1-800-813-HOPE 913-582-3884) ? [facility] Assists [place] cancer patients and their families through emotional , educational and financial support.  636-681-0902 ? [place] DSS Where to apply for food stamps, Medicaid and utility assistance. 463-225-3141 ? [facility]: Transportation to medical appointments. 253-405-4372 ? Social Security Administration: May apply for disability if have a Stage IV cancer. ([phone] 3101968461 ? [employer], Disability and Transit Services: Assists with nutrition, care and transit needs. ([phone] [phone]

## 2018-02-12 NOTE — Progress Notes (Signed)
[name] tolerated chemo tx with Neulasta on-pro well without complaints or incident. Neulasta on-pro applied to pt's left arm with green indicator light flashing upon discharge. VSS upon discharge. Pt discharged self ambulatory in satisfactory condition

## 2018-02-12 NOTE — Telephone Encounter (Signed)
Last OV 05/19/2017 Last refill   01/07/2018, 01/07/2018 Ok to refill?

## 2018-03-04 ENCOUNTER — Ambulatory Visit (HOSPITAL_COMMUNITY)
Admission: RE | Admit: 2018-03-04 | Discharge: 2018-03-04 | Disposition: A | Discharge status: Home / Self Care | Payer: Medicare PPO | Source: Ambulatory Visit | Attending: Nurse Practitioner | Admitting: Nurse Practitioner

## 2018-03-04 DIAGNOSIS — I2510 Atherosclerotic heart disease of native coronary artery without angina pectoris: Secondary | ICD-10-CM | POA: Insufficient documentation

## 2018-03-04 DIAGNOSIS — J432 Centrilobular emphysema: Secondary | ICD-10-CM | POA: Diagnosis not present

## 2018-03-04 DIAGNOSIS — J438 Other emphysema: Secondary | ICD-10-CM | POA: Diagnosis not present

## 2018-03-04 DIAGNOSIS — I700 Atherosclerosis of aorta: Secondary | ICD-10-CM | POA: Diagnosis not present

## 2018-03-04 DIAGNOSIS — R918 Other nonspecific abnormal finding of lung field: Secondary | ICD-10-CM | POA: Insufficient documentation

## 2018-03-04 DIAGNOSIS — C3490 Malignant neoplasm of unspecified part of unspecified bronchus or lung: Secondary | ICD-10-CM | POA: Insufficient documentation

## 2018-03-04 DIAGNOSIS — N281 Cyst of kidney, acquired: Secondary | ICD-10-CM | POA: Diagnosis not present

## 2018-03-04 MED ORDER — IOPAMIDOL (ISOVUE-300) INJECTION 61%
100.0000 mL | Freq: Once | INTRAVENOUS | Status: AC | PRN
  Administered 2018-03-04: 100 mL via INTRAVENOUS

## 2018-03-08 ENCOUNTER — Inpatient Hospital Stay (HOSPITAL_COMMUNITY): Payer: Medicare PPO

## 2018-03-08 ENCOUNTER — Inpatient Hospital Stay (HOSPITAL_BASED_OUTPATIENT_CLINIC_OR_DEPARTMENT_OTHER): Payer: Medicare PPO | Admitting: Hematology

## 2018-03-08 ENCOUNTER — Encounter (HOSPITAL_COMMUNITY): Payer: Self-pay | Admitting: Hematology

## 2018-03-08 VITALS — BP 138/57 | HR 70 | Temp 98.1°F | Resp 18 | Wt 145.9 lb

## 2018-03-08 VITALS — BP 126/55 | HR 63 | Temp 97.6°F | Resp 18 | Ht 69.0 in

## 2018-03-08 DIAGNOSIS — G893 Neoplasm related pain (acute) (chronic): Secondary | ICD-10-CM | POA: Diagnosis not present

## 2018-03-08 DIAGNOSIS — C770 Secondary and unspecified malignant neoplasm of lymph nodes of head, face and neck: Secondary | ICD-10-CM | POA: Diagnosis not present

## 2018-03-08 DIAGNOSIS — R112 Nausea with vomiting, unspecified: Secondary | ICD-10-CM

## 2018-03-08 DIAGNOSIS — D649 Anemia, unspecified: Secondary | ICD-10-CM | POA: Diagnosis not present

## 2018-03-08 DIAGNOSIS — C3490 Malignant neoplasm of unspecified part of unspecified bronchus or lung: Secondary | ICD-10-CM

## 2018-03-08 DIAGNOSIS — T451X5A Adverse effect of antineoplastic and immunosuppressive drugs, initial encounter: Secondary | ICD-10-CM | POA: Diagnosis not present

## 2018-03-08 DIAGNOSIS — Z5111 Encounter for antineoplastic chemotherapy: Secondary | ICD-10-CM | POA: Diagnosis not present

## 2018-03-08 DIAGNOSIS — C787 Secondary malignant neoplasm of liver and intrahepatic bile duct: Secondary | ICD-10-CM | POA: Diagnosis not present

## 2018-03-08 DIAGNOSIS — G620 Drug-induced polyneuropathy: Secondary | ICD-10-CM | POA: Diagnosis not present

## 2018-03-08 LAB — CBC WITH DIFFERENTIAL/PLATELET
Basophils Absolute: 0 10*3/uL (ref 0.0–0.1)
Basophils Relative: 0 %
Eosinophils Absolute: 0.1 10*3/uL (ref 0.0–0.7)
Eosinophils Relative: 1 %
HCT: 31.9 % — ABNORMAL LOW (ref 39.0–52.0)
Hemoglobin: 10.4 g/dL — ABNORMAL LOW (ref 13.0–17.0)
Lymphocytes Relative: 16 %
Lymphs Abs: 1.3 10*3/uL (ref 0.7–4.0)
MCH: 31.8 pg (ref 26.0–34.0)
MCHC: 32.6 g/dL (ref 30.0–36.0)
MCV: 97.6 fL (ref 78.0–100.0)
Monocytes Absolute: 1.1 10*3/uL — ABNORMAL HIGH (ref 0.1–1.0)
Monocytes Relative: 13 %
Neutro Abs: 6.1 10*3/uL (ref 1.7–7.7)
Neutrophils Relative %: 70 %
Platelets: 347 10*3/uL (ref 150–400)
RBC: 3.27 MIL/uL — ABNORMAL LOW (ref 4.22–5.81)
RDW: 20.6 % — ABNORMAL HIGH (ref 11.5–15.5)
WBC: 8.6 10*3/uL (ref 4.0–10.5)

## 2018-03-08 LAB — COMPREHENSIVE METABOLIC PANEL
ALT: 11 U/L (ref 0–44)
AST: 13 U/L — ABNORMAL LOW (ref 15–41)
Albumin: 3.9 g/dL (ref 3.5–5.0)
Alkaline Phosphatase: 52 U/L (ref 38–126)
Anion gap: 7 (ref 5–15)
BUN: 19 mg/dL (ref 8–23)
CO2: 26 mmol/L (ref 22–32)
Calcium: 9.1 mg/dL (ref 8.9–10.3)
Chloride: 106 mmol/L (ref 98–111)
Creatinine, Ser: 0.84 mg/dL (ref 0.61–1.24)
GFR calc Af Amer: >60 mL/min (ref >60)
GFR calc non Af Amer: >60 mL/min (ref >60)
Glucose, Bld: 141 mg/dL — ABNORMAL HIGH (ref 70–99)
Potassium: 4.1 mmol/L (ref 3.5–5.1)
Sodium: 139 mmol/L (ref 135–145)
Total Bilirubin: 0.4 mg/dL (ref 0.3–1.2)
Total Protein: 6.8 g/dL (ref 6.5–8.1)

## 2018-03-08 MED ORDER — SODIUM CHLORIDE 0.9% FLUSH
10.0000 mL | INTRAVENOUS | Status: DC | PRN
  Administered 2018-03-08: 10 mL
  Filled 2018-03-08: qty 10

## 2018-03-08 MED ORDER — TOPOTECAN HCL CHEMO INJECTION 4 MG
1.2000 mg/m2 | Freq: Once | INTRAVENOUS | Status: AC
  Administered 2018-03-08: 2.3 mg via INTRAVENOUS
  Filled 2018-03-08: qty 2.3

## 2018-03-08 MED ORDER — SODIUM CHLORIDE 0.9 % IV SOLN
Freq: Once | INTRAVENOUS | Status: AC
  Administered 2018-03-08: 12:00:00 via INTRAVENOUS

## 2018-03-08 MED ORDER — SODIUM CHLORIDE 0.9 % IV SOLN
Freq: Once | INTRAVENOUS | Status: AC
  Administered 2018-03-08: 12:00:00 via INTRAVENOUS
  Filled 2018-03-08: qty 2

## 2018-03-08 NOTE — Assessment & Plan Note (Signed)
1.  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 4 weeks, cycle 6 on 12/14/2017. - CT scan on 01/06/2018 after 6 cycles of chemotherapy showed slight improvement in the lung lesions.  It can be considered more or less stable disease.  No new areas were seen. -He is tolerating the reduced dose Topotecan every 4 weeks better.  Cycle 8 of chemotherapy was on 02/08/2018. - I reviewed the results of the CT CAP dated 03/04/2018 which showed decrease in size of the lung nodules by few millimeters.  No new areas were seen. -he will proceed with cycle 9 today at the same dose level.  2.  Normocytic anemia: Last Feraheme infusion was in 10/29/2017.  His anemia has improved since we changed his chemotherapy to every 4 weeks.  3.  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  He takes gabapentin 300 mg twice a day.  Numbness in the fingertips has gotten better.  Numbness in the feet has been stable.   4.  Nausea and vomiting: He is doing better in terms of nausea since we added Zofran to the premedications.  5.  Cancer/chemotherapy related pains: -This has been stable.  He is taking hydrocodone 2 to 3 tablets/day for hip and shoulder pains.

## 2018-03-08 NOTE — Progress Notes (Signed)
[place], [place] [address]   CLINIC:  Medical Oncology/Hematology  PCP:  [name], MD [address] [place] [address] [phone]   REASON FOR VISIT:  Follow-up for small cell lung cancer, extensive stage  CURRENT THERAPY: Topotecan every 4 weeks  BRIEF ONCOLOGIC HISTORY:    Extensive stage primary small cell carcinoma of lung ([place])   01/16/2017 Imaging    CT neck: IMPRESSION: 1. Bulky 5.4 cm right supraclavicular region malignant lymph node conglomeration with extracapsular extension. 2. Surrounding smaller abnormal right level 3 and level 5 lymph nodes, and the lymphadenopathy continues into the superior mediastinum, see Chest CT findings reported separately. 3. No other metastatic disease identified in the neck.    01/16/2017 Imaging    CT chest: IMPRESSION: 1. Extensive lymphadenopathy in the thorax and lower right cervical region, as discussed above. Primary differential considerations include lymphoma/leukemia or small cell carcinoma of the lung. Further evaluation a PET-CT could be considered to assess for additional sites of disease below the diaphragm if clinically appropriate. Additionally, ultrasound-guided biopsy of supraclavicular lymphadenopathy could be considered to establish a tissue diagnosis. 2. Indeterminate lesion in the periphery of segment 8 of the liver measuring 2.7 x 1.7 cm. Attention at time of follow-up PET-CT is recommended. 3. Aortic atherosclerosis, in addition to left main and 3 vessel coronary artery disease. Please note that although the presence of coronary artery calcium documents the presence of coronary artery disease, the severity of this disease and any potential stenosis cannot be assessed on this non-gated CT examination. Assessment for potential risk factor modification, dietary therapy or pharmacologic therapy may be warranted, if clinically indicated. 4. There are  calcifications of the aortic valve. Echocardiographic correlation for evaluation of potential valvular dysfunction may be warranted if clinically indicated. 5. Diffuse bronchial wall thickening with moderate centrilobular and paraseptal emphysema; imaging findings suggestive of underlying COPD.    02/03/2017 Initial Biopsy    (R) neck lymph node biopsy: SMALL CELL CARCINOMA (most likely lung primary).     02/03/2017 Miscellaneous    Port-a-cath attempted by IR; unable to place d/t enlarged SVC.     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 ([place])    02/09/2017 - 05/27/2017 Chemotherapy    6 cycles of cisplatin+etoposide     02/11/2017 Imaging    MRI brain: CLINICAL DATA:  Advanced stage small cell lung cancer. Staging for metastatic disease  EXAM: MRI HEAD WITHOUT AND WITH CONTRAST  TECHNIQUE: Multiplanar, multiecho pulse sequences of the brain and surrounding structures were obtained without and with intravenous contrast.  CONTRAST:  22m MULTIHANCE GADOBENATE DIMEGLUMINE 529 MG/ML IV SOLN  COMPARISON:  None.  FINDINGS: Brain: Negative for hydrocephalus. Cerebral volume normal for age. Small nonenhancing white matter hyperintensities consistent with mild chronic microvascular ischemia. No acute infarct. Negative for hemorrhage or mass or edema  Normal enhancement postcontrast infusion. No enhancing mass lesion. Leptomeningeal enhancement is normal.  Vascular: Normal arterial flow voids.  Normal venous enhancement  Skull and upper cervical spine: Negative  Sinuses/Orbits: Negative  Other: None  IMPRESSION: Negative for metastatic disease.  No acute abnormality.  Mild chronic white matter changes.    04/07/2017 Imaging     PET:  1. Marked reduction in size and metabolic activity of bulky RIGHT supraclavicular adenopathy mediastinal lymphadenopathy. 2. Residual moderate activity remains within small  RIGHT supraclavicular lymph node, RIGHT lower paratracheal lymph node and RIGHT  hilar lymph node. 3. Resolution of prevascular and internal mammary mediastinal metastatic hypermetabolic activity. 4. Resolution of metabolic activity associated with solitary RIGHT hepatic lobe liver metastasis. 5. No evidence of disease progression. 6. 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    05/27/2017 Imaging    MRI brain w/ and w/o contrast IMPRESSION: 1. No metastatic disease identified. 2. Increased nonspecific cerebral white matter signal changes since August. These are most commonly small vessel disease related. 3. New right maxillary sinusitis. Benign appearing retention cysts in the nasopharynx with trace mastoid effusions.    06/12/2017 Imaging    PET-CT IMPRESSION: 1. There are two new hypermetabolic nodules identified within both lower lobes measuring up to 3.1 cm. The appearance is nonspecific and may be inflammatory/infectious in etiology. Pulmonary metastatic disease cannot be excluded and short-term follow-up imaging in 3 months is advised to reassess these nodules. 2. Stable appearance of mild hypermetabolic activity associated with right paratracheal and right hilar lymph nodes. 3. Decrease in FDG uptake associated with index right supraclavicular lymph node. 4. 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 5. Aortic Atherosclerosis (ICD10-I70.0) and Emphysema (ICD10-J43.9).    08/19/2017 -  Chemotherapy    The patient had pegfilgrastim (NEULASTA ONPRO KIT) injection 6 mg, 6 mg, Subcutaneous, Once, 9 of 9 cycles Administration: 6 mg (08/28/2017), 6 mg (09/18/2017), 6 mg (10/09/2017), 6 mg (11/02/2017), 6 mg (11/20/2017), 6 mg (12/18/2017), 6 mg (01/15/2018), 6 mg (02/12/2018) topotecan (HYCAMTIN) 2.9  mg in sodium chloride 0.9 % 100 mL chemo infusion, 1.5 mg/m2 = 2.9 mg, Intravenous,  Once, 9 of 9 cycles Dose modification: 1.2 mg/m2 (80 % of original dose 1.5 mg/m2, Cycle 4, Reason: Dose Not Tolerated) Administration: 2.9 mg (08/24/2017), 2.9 mg (08/25/2017), 2.9 mg (08/28/2017), 2.9 mg (08/26/2017), 2.9 mg (08/27/2017), 2.9 mg (09/14/2017), 2.9 mg (09/15/2017), 2.9 mg (09/16/2017), 2.9 mg (09/17/2017), 2.9 mg (09/18/2017), 2.9 mg (10/05/2017), 2.9 mg (10/06/2017), 2.9 mg (10/07/2017), 2.9 mg (10/08/2017), 2.9 mg (10/09/2017), 2.3 mg (10/26/2017), 2.3 mg (10/27/2017), 2.3 mg (10/28/2017), 2.3 mg (10/29/2017), 2.3 mg (11/02/2017), 2.3 mg (11/16/2017), 2.3 mg (11/17/2017), 2.3 mg (11/18/2017), 2.3 mg (11/19/2017), 2.3 mg (11/20/2017), 2.3 mg (12/14/2017), 2.3 mg (12/15/2017), 2.3 mg (12/16/2017), 2.3 mg (12/17/2017), 2.3 mg (12/18/2017), 2.3 mg (01/11/2018), 2.3 mg (01/12/2018), 2.3 mg (01/13/2018), 2.3 mg (01/15/2018), 2.3 mg (02/08/2018), 2.3 mg (02/09/2018), 2.3 mg (02/10/2018), 2.3 mg (02/11/2018), 2.3 mg (02/12/2018) ondansetron (ZOFRAN) 4 mg in sodium chloride 0.9 % 50 mL IVPB, , Intravenous,  Once, 4 of 4 cycles Administration:  (02/08/2018),  (02/09/2018),  (02/10/2018),  (02/11/2018),  (02/12/2018)  for chemotherapy treatment.        INTERVAL HISTORY:  [name] 67 y.o. male returns for routine follow-up small cell lung cancer. Patient is here today and has done well with treatment. He has had some diarrhea but usually stops a few days after treatment. Patient appetite is decreased. He is still maintaining his weight. He is trying force himself to eat 5 small meals a day to keep from loosing weight. He is also drinking 2 ensure daily. Patient reports his appetite at 25%. His energy level is 75%. Patient denies any new pains. Denies any SOB. Denies any nausea or vomiting. Denies any rash or itching. Denies any mouth sores.     REVIEW OF SYSTEMS:  Review of Systems  Gastrointestinal: Positive for diarrhea.  Neurological: Positive  for numbness (feet and  hands).  Hematological: Bruises/bleeds easily.  All other systems reviewed and are negative.    PAST MEDICAL/SURGICAL HISTORY:  Past Medical History:  Diagnosis Date  . Anxiety   . CAD (coronary artery disease)   . Cancer ([place])    stage 4 small cell lung cancer   . COPD (chronic obstructive pulmonary disease) ([place])   . Depression   . Dyspnea    increased exertion  . Feeling of chest tightness   . Heart palpitations   . History of chemotherapy   . Myocardial infarction ([place])   . Osteopenia   . Panic attacks   . Smoker    Past Surgical History:  Procedure Laterality Date  . BACK SURGERY  12/24/2000   L5,S1  . CORONARY STENT PLACEMENT  2005   RCA & CX  . HERNIA REPAIR Right 1980's  . INGUINAL HERNIA REPAIR  12/1978   right side  . IR FLUORO GUIDE PORT INSERTION RIGHT  04/02/2017  . IR US GUIDE BX ASP/DRAIN  02/03/2017  . IR US GUIDE VASC ACCESS RIGHT  04/02/2017  . NM MYOCAR PERF WALL MOTION  09/07/2009   No ischemia; EF 51%  . SHOULDER SURGERY Left 08/2010  . SPINE SURGERY  2002   L5-S1     SOCIAL HISTORY:  Social History   Socioeconomic History  . Marital status: Single    Spouse name: Not on file  . Number of children: Not on file  . Years of education: Not on file  . Highest education level: Not on file  Occupational History  . Not on file  Social Needs  . Financial resource strain: Not on file  . Food insecurity:    Worry: Not on file    Inability: Not on file  . Transportation needs:    Medical: Not on file    Non-medical: Not on file  Tobacco Use  . Smoking status: Current Every Day Smoker    Packs/day: 1.00    Types: Cigarettes  . Smokeless tobacco: Never Used  Substance and Sexual Activity  . Alcohol use: Yes    Comment: occas  . Drug use: No  . Sexual activity: Not on file  Lifestyle  . Physical activity:    Days per week: Not on file    Minutes per session: Not on file  . Stress: Not on file  Relationships  . Social connections:     Talks on phone: Not on file    Gets together: Not on file    Attends religious service: Not on file    Active member of club or organization: Not on file    Attends meetings of clubs or organizations: Not on file    Relationship status: Not on file  . Intimate partner violence:    Fear of current or ex partner: Not on file    Emotionally abused: Not on file    Physically abused: Not on file    Forced sexual activity: Not on file  Other Topics Concern  . Not on file  Social History Narrative  . Not on file    FAMILY HISTORY:  Family History  Problem Relation Age of Onset  . Heart attack Father   . Kidney disease Father        renal failure  . Heart failure Mother   . Heart attack Mother   . Cancer Brother   . Diabetes Brother   . Alcohol abuse Brother   . Diabetes Sister  CURRENT MEDICATIONS:  Outpatient Encounter Medications as of 03/08/2018  Medication Sig  . ALPRAZolam (XANAX) 1 MG tablet TAKE (1) TABLET BY MOUTH (4) TIMES DAILY.  Marland Kitchen atorvastatin (LIPITOR) 10 MG tablet TAKE 1 TABLET AT BEDTIME  (DISCONTINUE ATORVASTATIN 20MG)  . clotrimazole-betamethasone (LOTRISONE) cream Apply 1 application topically 2 (two) times daily.  . cyclobenzaprine (FLEXERIL) 5 MG tablet TAKE 1 TABLET THREE TIMES DAILY AS NEEDED FOR MUSCLE SPASMS.  Marland Kitchen dexamethasone (DECADRON) 4 MG tablet   . gabapentin (NEURONTIN) 300 MG capsule Take 1 capsule (300 mg total) by mouth 2 (two) times daily.  Marland Kitchen HYDROcodone-acetaminophen (NORCO) 5-325 MG tablet Take 1 tablet by mouth every 6 (six) hours as needed for moderate pain.  Marland Kitchen lidocaine-prilocaine (EMLA) cream Apply to affected area once  . ondansetron (ZOFRAN) 8 MG tablet Take 1 tablet (8 mg total) by mouth 2 (two) times daily as needed. Start on the third day after cisplatin chemotherapy.  . Pegfilgrastim (NEULASTA ONPRO [place]) Inject into the skin. Every 21 days  . polyethylene glycol powder (GLYCOLAX/MIRALAX) powder Take 17 g by mouth daily.  .  prochlorperazine (COMPAZINE) 10 MG tablet Take 1 tablet (10 mg total) by mouth every 6 (six) hours as needed (Nausea or vomiting).  . TOPOTECAN HCL IV Inject into the vein.  Marland Kitchen topotecan in sodium chloride 0.9 % 100 mL Inject into the vein once. Days 1-5 every 21 days  . [DISCONTINUED] cyclobenzaprine (FLEXERIL) 5 MG tablet TAKE 1 TABLET THREE TIMES DAILY AS NEEDED FOR MUSCLE SPASMS.   No facility-administered encounter medications on file as of 03/08/2018.     ALLERGIES:  Allergies  Allergen Reactions  . Codeine Nausea Only  . Niaspan [Niacin Er]      PHYSICAL EXAM:  ECOG Performance status: 1  Vitals:   03/08/18 1009  BP: (!) 138/57  Pulse: 70  Resp: 18  Temp: 98.1 F (36.7 C)  SpO2: 96%   Filed Weights   03/08/18 1009  Weight: 145 lb 14.4 oz (66.2 kg)    Physical Exam  Constitutional: He is oriented to person, place, and time. He appears well-developed and well-nourished.  Cardiovascular: Normal rate, regular rhythm and normal heart sounds.  Pulmonary/Chest: Effort normal and breath sounds normal.  Neurological: He is alert and oriented to person, place, and time.  Skin: Skin is warm and dry.     LABORATORY DATA:  I have reviewed the labs as listed.  CBC    Component Value Date/Time   WBC 8.6 03/08/2018 0937   RBC 3.27 (L) 03/08/2018 0937   HGB 10.4 (L) 03/08/2018 0937   HCT 31.9 (L) 03/08/2018 0937   PLT 347 03/08/2018 0937   MCV 97.6 03/08/2018 0937   MCH 31.8 03/08/2018 0937   MCHC 32.6 03/08/2018 0937   RDW 20.6 (H) 03/08/2018 0937   LYMPHSABS 1.3 03/08/2018 0937   MONOABS 1.1 (H) 03/08/2018 0937   EOSABS 0.1 03/08/2018 0937   BASOSABS 0.0 03/08/2018 0937   CMP Latest Ref Rng & Units 03/08/2018 02/08/2018 01/11/2018  Glucose 70 - 99 mg/dL 141(H) 118(H) 129(H)  BUN 8 - 23 mg/dL _0 Creatinine 0.61 - 1.24 mg/dL 0.84 0.81 0.85  Sodium 135 - 145 mmol/L 139 138 141  Potassium 3.5 - 5.1 mmol/L 4.1 3.8 3.8  Chloride 98 - 111 mmol/L 106 107 107  CO2  22 - 32 mmol/L _1 Calcium 8.9 - 10.3 mg/dL 9.1 9.2 9.2  Total Protein 6.5 - 8.1 g/dL 6.8 6.7 6.9  Total Bilirubin 0.3 - 1.2 mg/dL 0.4 0.5 0.3  Alkaline Phos 38 - 126 U/L 52 63 62  AST 15 - 41 U/L 13(L) 14(L) 14(L)  ALT 0 - 44 U/L _0 DIAGNOSTIC IMAGING:  I have reviewed images of the CT CAP dated 03/02/2018 and discussed with the patient.     ASSESSMENT & PLAN:   Extensive stage primary small cell carcinoma of lung ([place]) 1.  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 4 weeks, cycle 6 on 12/14/2017. - CT scan on 01/06/2018 after 6 cycles of chemotherapy showed slight improvement in the lung lesions.  It can be considered more or less stable disease.  No new areas were seen. -He is tolerating the reduced dose Topotecan every 4 weeks better.  Cycle 8 of chemotherapy was on 02/08/2018. - I reviewed the results of the CT CAP dated 03/04/2018 which showed decrease in size of the lung nodules by few millimeters.  No new areas were seen. -he will proceed with cycle 9 today at the same dose level.  2.  Normocytic anemia: Last Feraheme infusion was in 10/29/2017.  His anemia has improved since we changed his chemotherapy to every 4 weeks.  3.  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  He takes gabapentin 300 mg twice a day.  Numbness in the fingertips has gotten  better.  Numbness in the feet has been stable.   4.  Nausea and vomiting: He is doing better in terms of nausea since we added Zofran to the premedications.  5.  Cancer/chemotherapy related pains: -This has been stable.  He is taking hydrocodone 2 to 3 tablets/day for hip and shoulder pains.        Orders placed this encounter:  Orders Placed This Encounter  Procedures  . CBC with Differential/Platelet  . Comprehensive metabolic panel      Derek Jack, MD [place] [phone]

## 2018-03-08 NOTE — Patient Instructions (Signed)
[facility] Discharge Instructions for Patients Receiving Chemotherapy  Today you received the following chemotherapy agents topetecan.    If you develop nausea and vomiting that is not controlled by your nausea medication, call the clinic.   BELOW ARE SYMPTOMS THAT SHOULD BE REPORTED IMMEDIATELY:  *FEVER GREATER THAN 100.5 F  *CHILLS WITH OR WITHOUT FEVER  NAUSEA AND VOMITING THAT IS NOT CONTROLLED WITH YOUR NAUSEA MEDICATION  *UNUSUAL SHORTNESS OF BREATH  *UNUSUAL BRUISING OR BLEEDING  TENDERNESS IN MOUTH AND THROAT WITH OR WITHOUT PRESENCE OF ULCERS  *URINARY PROBLEMS  *BOWEL PROBLEMS  UNUSUAL RASH Items with * indicate a potential emergency and should be followed up as soon as possible.  Feel free to call the clinic should you have any questions or concerns. The clinic phone number is [phone].  Please show the [facility] at check-in to the Emergency Department and triage nurse.

## 2018-03-08 NOTE — Progress Notes (Signed)
Patient seen by oncologist with lab review and ok to treat today verbal order Dr. Delton Coombes.   Patient tolerated chemotherapy with no complaints voiced.  Port site clean and dry with good blood return noted before and after administration of therapy.  No bruising or swelling noted at site.  Dressing intact.  VSS with discharge and left ambulatory with no s/s of distress noted.

## 2018-03-08 NOTE — Patient Instructions (Signed)
[facility] at [facility] Discharge Instructions  Follow up in 4 weeks with labs    Thank you for choosing [facility] at [facility] to provide your oncology and hematology care.  To afford each patient quality time with our provider, please arrive at least 15 minutes before your scheduled appointment time.   If you have a lab appointment with the Cancer Center please come in thru the  Main Entrance and check in at the main information desk  You need to re-schedule your appointment should you arrive 10 or more minutes late.  We strive to give you quality time with our providers, and arriving late affects you and other patients whose appointments are after yours.  Also, if you no show three or more times for appointments you may be dismissed from the clinic at the providers discretion.     Again, thank you for choosing [facility].  Our hope is that these requests will decrease the amount of time that you wait before being seen by our physicians.       _____________________________________________________________  Should you have questions after your visit to [facility], please contact our office at [phone] between the hours of 8:00 a.m. and 4:30 p.m.  Voicemails left after 4:00 p.m. will not be returned until the following business day.  For prescription refill requests, have your pharmacy contact our office and allow 72 hours.    Cancer Center Support Programs:   > Cancer Support Group  2nd Tuesday of the month 1pm-2pm, Journey Room    

## 2018-03-09 ENCOUNTER — Inpatient Hospital Stay (HOSPITAL_COMMUNITY): Payer: Medicare PPO

## 2018-03-09 VITALS — BP 121/65 | HR 63 | Temp 97.5°F | Resp 18

## 2018-03-09 DIAGNOSIS — C770 Secondary and unspecified malignant neoplasm of lymph nodes of head, face and neck: Secondary | ICD-10-CM | POA: Diagnosis not present

## 2018-03-09 DIAGNOSIS — C787 Secondary malignant neoplasm of liver and intrahepatic bile duct: Secondary | ICD-10-CM | POA: Diagnosis not present

## 2018-03-09 DIAGNOSIS — C3490 Malignant neoplasm of unspecified part of unspecified bronchus or lung: Secondary | ICD-10-CM

## 2018-03-09 DIAGNOSIS — Z5111 Encounter for antineoplastic chemotherapy: Secondary | ICD-10-CM | POA: Diagnosis not present

## 2018-03-09 MED ORDER — SODIUM CHLORIDE 0.9 % IV SOLN
Freq: Once | INTRAVENOUS | Status: AC
  Administered 2018-03-09: 12:00:00 via INTRAVENOUS
  Filled 2018-03-09: qty 2

## 2018-03-09 MED ORDER — SODIUM CHLORIDE 0.9 % IV SOLN
Freq: Once | INTRAVENOUS | Status: AC
  Administered 2018-03-09: 12:00:00 via INTRAVENOUS

## 2018-03-09 MED ORDER — TOPOTECAN HCL CHEMO INJECTION 4 MG
1.2000 mg/m2 | Freq: Once | INTRAVENOUS | Status: AC
  Administered 2018-03-09: 2.3 mg via INTRAVENOUS
  Filled 2018-03-09: qty 2.3

## 2018-03-09 MED ORDER — SODIUM CHLORIDE 0.9% FLUSH
10.0000 mL | INTRAVENOUS | Status: DC | PRN
  Administered 2018-03-09: 10 mL
  Filled 2018-03-09: qty 10

## 2018-03-09 NOTE — Progress Notes (Signed)
[name] tolerated Topotecan infusion well without incident or complaint. VSS after completion of treatment. Portacath flushed and left accessed for use tomorrow. Discharged self ambulatory in satisfactory condition.

## 2018-03-09 NOTE — Patient Instructions (Signed)
[place] at [facility]  Discharge Instructions:  Today you received your Topotecan infusion. Follow up as scheduled. Call clinic for any questions or concerns. _______________________________________________________________  Thank you for choosing [place] at [facility] to provide your oncology and hematology care.  To afford each patient quality time with our providers, please arrive at least 15 minutes before your scheduled appointment.  You need to re-schedule your appointment if you arrive 10 or more minutes late.  We strive to give you quality time with our providers, and arriving late affects you and other patients whose appointments are after yours.  Also, if you no show three or more times for appointments you may be dismissed from the clinic.  Again, thank you for choosing [facility] [facility] hope is that these requests will allow you access to exceptional care and in a timely manner. _______________________________________________________________  If you have questions after your visit, please contact our office at [phone] between the hours of 8:30 a.m. and 5:00 p.m. Voicemails left after 4:30 p.m. will not be returned until the following business day. _______________________________________________________________  For prescription refill requests, have your pharmacy contact our office. _______________________________________________________________  Recommendations made by the consultant and any test results will be sent to your referring physician. _______________________________________________________________

## 2018-03-10 ENCOUNTER — Inpatient Hospital Stay (HOSPITAL_COMMUNITY): Payer: Medicare PPO

## 2018-03-10 VITALS — BP 109/59 | HR 58 | Temp 97.9°F | Resp 16 | Wt 146.8 lb

## 2018-03-10 DIAGNOSIS — C787 Secondary malignant neoplasm of liver and intrahepatic bile duct: Secondary | ICD-10-CM | POA: Diagnosis not present

## 2018-03-10 DIAGNOSIS — C770 Secondary and unspecified malignant neoplasm of lymph nodes of head, face and neck: Secondary | ICD-10-CM | POA: Diagnosis not present

## 2018-03-10 DIAGNOSIS — Z5111 Encounter for antineoplastic chemotherapy: Secondary | ICD-10-CM | POA: Diagnosis not present

## 2018-03-10 DIAGNOSIS — C3490 Malignant neoplasm of unspecified part of unspecified bronchus or lung: Secondary | ICD-10-CM

## 2018-03-10 MED ORDER — TOPOTECAN HCL CHEMO INJECTION 4 MG
1.2000 mg/m2 | Freq: Once | INTRAVENOUS | Status: AC
  Administered 2018-03-10: 2.3 mg via INTRAVENOUS
  Filled 2018-03-10: qty 2.3

## 2018-03-10 MED ORDER — SODIUM CHLORIDE 0.9% FLUSH
10.0000 mL | INTRAVENOUS | Status: DC | PRN
  Administered 2018-03-10: 10 mL
  Filled 2018-03-10: qty 10

## 2018-03-10 MED ORDER — SODIUM CHLORIDE 0.9 % IV SOLN
Freq: Once | INTRAVENOUS | Status: AC
  Administered 2018-03-10: 11:00:00 via INTRAVENOUS
  Filled 2018-03-10: qty 2

## 2018-03-10 MED ORDER — SODIUM CHLORIDE 0.9 % IV SOLN
Freq: Once | INTRAVENOUS | Status: AC
  Administered 2018-03-10: 11:00:00 via INTRAVENOUS

## 2018-03-11 ENCOUNTER — Encounter (HOSPITAL_COMMUNITY): Payer: Self-pay

## 2018-03-11 ENCOUNTER — Inpatient Hospital Stay (HOSPITAL_COMMUNITY): Payer: Medicare PPO

## 2018-03-11 VITALS — BP 121/60 | HR 66 | Temp 97.6°F | Resp 16

## 2018-03-11 DIAGNOSIS — C3490 Malignant neoplasm of unspecified part of unspecified bronchus or lung: Secondary | ICD-10-CM

## 2018-03-11 DIAGNOSIS — C787 Secondary malignant neoplasm of liver and intrahepatic bile duct: Secondary | ICD-10-CM | POA: Diagnosis not present

## 2018-03-11 DIAGNOSIS — Z5111 Encounter for antineoplastic chemotherapy: Secondary | ICD-10-CM | POA: Diagnosis not present

## 2018-03-11 DIAGNOSIS — C770 Secondary and unspecified malignant neoplasm of lymph nodes of head, face and neck: Secondary | ICD-10-CM | POA: Diagnosis not present

## 2018-03-11 MED ORDER — SODIUM CHLORIDE 0.9 % IV SOLN
Freq: Once | INTRAVENOUS | Status: AC
  Administered 2018-03-11: 11:00:00 via INTRAVENOUS
  Filled 2018-03-11: qty 2

## 2018-03-11 MED ORDER — SODIUM CHLORIDE 0.9 % IV SOLN
Freq: Once | INTRAVENOUS | Status: AC
  Administered 2018-03-11: 11:00:00 via INTRAVENOUS

## 2018-03-11 MED ORDER — TOPOTECAN HCL CHEMO INJECTION 4 MG
1.2000 mg/m2 | Freq: Once | INTRAVENOUS | Status: AC
  Administered 2018-03-11: 2.3 mg via INTRAVENOUS
  Filled 2018-03-11: qty 2.3

## 2018-03-11 MED ORDER — SODIUM CHLORIDE 0.9% FLUSH
10.0000 mL | INTRAVENOUS | Status: DC | PRN
  Administered 2018-03-11: 10 mL
  Filled 2018-03-11: qty 10

## 2018-03-11 NOTE — Progress Notes (Signed)
Patient tolerated chemotherapy with no complaints voiced.  Port site clean and dry with good blood return noted before and after chemo.  Dressing intact.  VSS with discharge and left ambulatory with no s/s of distress noted.

## 2018-03-11 NOTE — Patient Instructions (Signed)
[facility] Discharge Instructions for Patients Receiving Chemotherapy  Today you received the following chemotherapy agents topetecan.    If you develop nausea and vomiting that is not controlled by your nausea medication, call the clinic.   BELOW ARE SYMPTOMS THAT SHOULD BE REPORTED IMMEDIATELY:  *FEVER GREATER THAN 100.5 F  *CHILLS WITH OR WITHOUT FEVER  NAUSEA AND VOMITING THAT IS NOT CONTROLLED WITH YOUR NAUSEA MEDICATION  *UNUSUAL SHORTNESS OF BREATH  *UNUSUAL BRUISING OR BLEEDING  TENDERNESS IN MOUTH AND THROAT WITH OR WITHOUT PRESENCE OF ULCERS  *URINARY PROBLEMS  *BOWEL PROBLEMS  UNUSUAL RASH Items with * indicate a potential emergency and should be followed up as soon as possible.  Feel free to call the clinic should you have any questions or concerns. The clinic phone number is [phone].  Please show the [facility] at check-in to the Emergency Department and triage nurse.

## 2018-03-12 ENCOUNTER — Inpatient Hospital Stay (HOSPITAL_COMMUNITY): Payer: Medicare PPO

## 2018-03-12 ENCOUNTER — Ambulatory Visit (HOSPITAL_COMMUNITY): Payer: Medicare PPO

## 2018-03-12 VITALS — BP 132/65 | HR 74 | Temp 97.5°F | Resp 18

## 2018-03-12 DIAGNOSIS — Z5111 Encounter for antineoplastic chemotherapy: Secondary | ICD-10-CM | POA: Diagnosis not present

## 2018-03-12 DIAGNOSIS — C3490 Malignant neoplasm of unspecified part of unspecified bronchus or lung: Secondary | ICD-10-CM | POA: Diagnosis not present

## 2018-03-12 DIAGNOSIS — C770 Secondary and unspecified malignant neoplasm of lymph nodes of head, face and neck: Secondary | ICD-10-CM | POA: Diagnosis not present

## 2018-03-12 DIAGNOSIS — C787 Secondary malignant neoplasm of liver and intrahepatic bile duct: Secondary | ICD-10-CM | POA: Diagnosis not present

## 2018-03-12 MED ORDER — SODIUM CHLORIDE 0.9 % IV SOLN
Freq: Once | INTRAVENOUS | Status: AC
  Administered 2018-03-12: 11:00:00 via INTRAVENOUS
  Filled 2018-03-12: qty 2

## 2018-03-12 MED ORDER — HEPARIN SOD (PORK) LOCK FLUSH 100 UNIT/ML IV SOLN
500.0000 [IU] | Freq: Once | INTRAVENOUS | Status: AC | PRN
  Administered 2018-03-12: 500 [IU]
  Filled 2018-03-12: qty 5

## 2018-03-12 MED ORDER — TOPOTECAN HCL CHEMO INJECTION 4 MG
1.2000 mg/m2 | Freq: Once | INTRAVENOUS | Status: AC
  Administered 2018-03-12: 2.3 mg via INTRAVENOUS
  Filled 2018-03-12: qty 2.3

## 2018-03-12 MED ORDER — SODIUM CHLORIDE 0.9 % IV SOLN
Freq: Once | INTRAVENOUS | Status: AC
  Administered 2018-03-12: 11:00:00 via INTRAVENOUS

## 2018-03-12 MED ORDER — PEGFILGRASTIM 6 MG/0.6ML ~~LOC~~ PSKT
6.0000 mg | PREFILLED_SYRINGE | Freq: Once | SUBCUTANEOUS | Status: AC
  Administered 2018-03-12: 6 mg via SUBCUTANEOUS
  Filled 2018-03-12: qty 0.6

## 2018-03-12 NOTE — Progress Notes (Signed)
Tolerated infusion w/o adverse reaction.  Alert, in no distress.  Discharged ambulatory. Neulasta OnPro in place RUE and engaged with green light flashing.

## 2018-03-16 ENCOUNTER — Other Ambulatory Visit: Payer: Self-pay | Admitting: Physician Assistant

## 2018-03-16 ENCOUNTER — Other Ambulatory Visit (HOSPITAL_COMMUNITY): Payer: Self-pay | Admitting: Hematology

## 2018-03-16 DIAGNOSIS — G893 Neoplasm related pain (acute) (chronic): Secondary | ICD-10-CM

## 2018-03-16 DIAGNOSIS — C3490 Malignant neoplasm of unspecified part of unspecified bronchus or lung: Secondary | ICD-10-CM

## 2018-03-16 NOTE — Telephone Encounter (Signed)
Ok to refill??  Last office visit 05/19/2017.  Last refill 02/12/2018 on Xanax.

## 2018-03-19 ENCOUNTER — Other Ambulatory Visit (HOSPITAL_COMMUNITY): Payer: Self-pay | Admitting: Nurse Practitioner

## 2018-03-19 DIAGNOSIS — G893 Neoplasm related pain (acute) (chronic): Secondary | ICD-10-CM

## 2018-03-19 DIAGNOSIS — C3490 Malignant neoplasm of unspecified part of unspecified bronchus or lung: Secondary | ICD-10-CM

## 2018-03-19 MED ORDER — HYDROCODONE-ACETAMINOPHEN 5-325 MG PO TABS: 1.0000 | ORAL_TABLET | Freq: Four times a day (QID) | ORAL | 0 refills | Status: DC | PRN

## 2018-04-05 ENCOUNTER — Other Ambulatory Visit: Payer: Self-pay

## 2018-04-05 ENCOUNTER — Inpatient Hospital Stay (HOSPITAL_COMMUNITY): Payer: Medicare PPO

## 2018-04-05 ENCOUNTER — Inpatient Hospital Stay (HOSPITAL_COMMUNITY): Payer: Medicare PPO | Attending: Hematology

## 2018-04-05 ENCOUNTER — Inpatient Hospital Stay (HOSPITAL_BASED_OUTPATIENT_CLINIC_OR_DEPARTMENT_OTHER): Payer: Medicare PPO | Admitting: Internal Medicine

## 2018-04-05 VITALS — BP 130/55 | HR 75 | Temp 97.6°F | Resp 18 | Wt 148.8 lb

## 2018-04-05 VITALS — BP 139/61 | HR 58 | Temp 97.7°F | Resp 18

## 2018-04-05 DIAGNOSIS — C3490 Malignant neoplasm of unspecified part of unspecified bronchus or lung: Secondary | ICD-10-CM

## 2018-04-05 DIAGNOSIS — Z5111 Encounter for antineoplastic chemotherapy: Secondary | ICD-10-CM | POA: Diagnosis not present

## 2018-04-05 DIAGNOSIS — R110 Nausea: Secondary | ICD-10-CM | POA: Diagnosis not present

## 2018-04-05 DIAGNOSIS — Z23 Encounter for immunization: Secondary | ICD-10-CM | POA: Insufficient documentation

## 2018-04-05 DIAGNOSIS — C787 Secondary malignant neoplasm of liver and intrahepatic bile duct: Secondary | ICD-10-CM

## 2018-04-05 DIAGNOSIS — D649 Anemia, unspecified: Secondary | ICD-10-CM | POA: Diagnosis not present

## 2018-04-05 DIAGNOSIS — C770 Secondary and unspecified malignant neoplasm of lymph nodes of head, face and neck: Secondary | ICD-10-CM

## 2018-04-05 DIAGNOSIS — T451X5A Adverse effect of antineoplastic and immunosuppressive drugs, initial encounter: Secondary | ICD-10-CM

## 2018-04-05 DIAGNOSIS — G620 Drug-induced polyneuropathy: Secondary | ICD-10-CM

## 2018-04-05 DIAGNOSIS — G893 Neoplasm related pain (acute) (chronic): Secondary | ICD-10-CM | POA: Diagnosis not present

## 2018-04-05 LAB — CBC WITH DIFFERENTIAL/PLATELET
Basophils Absolute: 0 10*3/uL (ref 0.0–0.1)
Basophils Relative: 0 %
Eosinophils Absolute: 0.1 10*3/uL (ref 0.0–0.7)
Eosinophils Relative: 1 %
HCT: 33 % — ABNORMAL LOW (ref 39.0–52.0)
Hemoglobin: 10.8 g/dL — ABNORMAL LOW (ref 13.0–17.0)
Lymphocytes Relative: 17 %
Lymphs Abs: 1.6 10*3/uL (ref 0.7–4.0)
MCH: 31.9 pg (ref 26.0–34.0)
MCHC: 32.7 g/dL (ref 30.0–36.0)
MCV: 97.3 fL (ref 78.0–100.0)
Monocytes Absolute: 1.3 10*3/uL — ABNORMAL HIGH (ref 0.1–1.0)
Monocytes Relative: 14 %
Neutro Abs: 6.3 10*3/uL (ref 1.7–7.7)
Neutrophils Relative %: 68 %
Platelets: 347 10*3/uL (ref 150–400)
RBC: 3.39 MIL/uL — ABNORMAL LOW (ref 4.22–5.81)
RDW: 21.8 % — ABNORMAL HIGH (ref 11.5–15.5)
WBC: 9.3 10*3/uL (ref 4.0–10.5)

## 2018-04-05 LAB — COMPREHENSIVE METABOLIC PANEL
ALT: 10 U/L (ref 0–44)
AST: 14 U/L — ABNORMAL LOW (ref 15–41)
Albumin: 4.2 g/dL (ref 3.5–5.0)
Alkaline Phosphatase: 63 U/L (ref 38–126)
Anion gap: 9 (ref 5–15)
BUN: 17 mg/dL (ref 8–23)
CO2: 23 mmol/L (ref 22–32)
Calcium: 9.1 mg/dL (ref 8.9–10.3)
Chloride: 108 mmol/L (ref 98–111)
Creatinine, Ser: 0.81 mg/dL (ref 0.61–1.24)
GFR calc Af Amer: >60 mL/min (ref >60)
GFR calc non Af Amer: >60 mL/min (ref >60)
Glucose, Bld: 97 mg/dL (ref 70–99)
Potassium: 4.4 mmol/L (ref 3.5–5.1)
Sodium: 140 mmol/L (ref 135–145)
Total Bilirubin: 0.2 mg/dL — ABNORMAL LOW (ref 0.3–1.2)
Total Protein: 7.2 g/dL (ref 6.5–8.1)

## 2018-04-05 MED ORDER — TOPOTECAN HCL CHEMO INJECTION 4 MG
1.2000 mg/m2 | Freq: Once | INTRAVENOUS | Status: AC
  Administered 2018-04-05: 2.2 mg via INTRAVENOUS
  Filled 2018-04-05: qty 2.2

## 2018-04-05 MED ORDER — SODIUM CHLORIDE 0.9 % IV SOLN
Freq: Once | INTRAVENOUS | Status: AC
  Administered 2018-04-05: 12:00:00 via INTRAVENOUS
  Filled 2018-04-05: qty 2

## 2018-04-05 MED ORDER — SODIUM CHLORIDE 0.9 % IV SOLN
Freq: Once | INTRAVENOUS | Status: AC
  Administered 2018-04-05: 11:00:00 via INTRAVENOUS

## 2018-04-05 MED ORDER — SODIUM CHLORIDE 0.9% FLUSH
10.0000 mL | INTRAVENOUS | Status: DC | PRN
  Administered 2018-04-05: 10 mL
  Filled 2018-04-05: qty 10

## 2018-04-05 NOTE — Progress Notes (Signed)
1050 labs reviewed and patient seen by Dr. Walden Field who approved patient for treatment today.  Harvie Junior tolerated Topotecan infusion without incident or complaint. VSS upon completion of treatment. Port saline lock and left accessed for use tomorrow. Discharged self ambulatory in satisfactory condition.

## 2018-04-05 NOTE — Progress Notes (Signed)
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  Small cell lung cancer with liver metastasis.  Pt is seen today for follow-up prior to C10 of Topotecan.   -He is tolerating the reduced dose Topotecan every 4 weeks better.   - CT CAP dated 03/04/2018 showed decrease in size of the lung nodules by few millimeters.  No new areas were seen.  Labs done 04/05/2018 reviewed and showed WBC 9.3 HB 10.8 plts 347,000.  Chemistries WNL with K+ 4.4 Cr 0.81 and normal LFTs.  Pt will Proceed with C10.  He will RTC in 04/2018 for follow-up and labs with Dr. Delton Coombes.  Next imaging in 05/2018 per Dr. Delton Coombes.   -he will proceed with cycle 9 today at the same dose level.  2.  Neuropathy.  Pt on Neurontin 300 mg bid.  Will continue to monitor.  ormocytic anemia: Last Feraheme infusion was in 10/29/2017.  His anemia has improved since we changed his chemotherapy to every 4 weeks.  3.  Vision changes.  He reports it has been several years since he has seen eye doctor.  He had MRI of brain 05/28/2017 that was negative other than sinusitis.  Pt advised to see eye doctor.  Further workup pending eye exam.    4.  Nausea.  Continue Zofran as recommended.    5  Cancer related  pain.  Pt is on hydrocodone.    30 minutes spent with more than 50% spent in counseling and coordination of care.     Interval History:  Historical data obtained from note dated 03/08/2018:   1.  Small cell lung cancer with liver metastasis: - Status post 6 cycles of cisplatin and VP-16 completed on 05/25/2017 - PET/CT scan on 08/13/2017 showing progression with bilateral lung nodules and mediastinal adenopathy -Topotecan (1.5 mg/m square) for 5 days every 21  days started on 08/24/2017, cycle 2 on 09/14/2017, cycle 3 on 10/05/2017 -CT scan of the chest abdomen and pelvis after 3 cycles showing decrease in size of the lung nodules and mediastinal adenopathy, MRI of the brain pending in [place] on 10/28/2017 - Cycle 4 neuropathy can dose reduced to 1.2 mg/m2, cycle 5 on 11/16/2017, cycle duration changed to q. 4 weeks, cycle 6 on 12/14/2017. - CT scan on 01/06/2018 after 6 cycles of chemotherapy showed slight improvement in the lung lesions.  It can be considered more or less stable disease.  No new areas were seen. -He is tolerating the reduced dose Topotecan every 4 weeks better.  Cycle 8 of chemotherapy was on 02/08/2018. - CT CAP dated 03/04/2018 which showed decrease in size of the lung nodules by few millimeters.  No new areas were seen.  Current Status:  Pt is seen today for follow-up prior to Topotecan.  He reports some changes in vision.  He has not seen eye doctor.       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 Bulky 5.4 cm right supraclavicular region malignant lymph node conglomeration with extracapsular extension. 2. Surrounding smaller abnormal right level 3 and level 5 lymph nodes, and the lymphadenopathy continues into the superior mediastinum, see Chest CT findings reported separately. 3. No other metastatic disease identified in the  neck.    01/16/2017 Imaging    CT chest: IMPRESSION: 1. Extensive lymphadenopathy in the thorax and lower right cervical region, as discussed above. Primary differential considerations include lymphoma/leukemia or small cell carcinoma of the lung. Further evaluation a PET-CT could be considered to assess for additional sites of disease below the diaphragm if clinically appropriate. Additionally, ultrasound-guided biopsy of supraclavicular lymphadenopathy could be considered to establish a tissue diagnosis. 2. Indeterminate lesion in the periphery of  segment 8 of the liver measuring 2.7 x 1.7 cm. Attention at time of follow-up PET-CT is recommended. 3. Aortic atherosclerosis, in addition to left main and 3 vessel coronary artery disease. Please note that although the presence of coronary artery calcium documents the presence of coronary artery disease, the severity of this disease and any potential stenosis cannot be assessed on this non-gated CT examination. Assessment for potential risk factor modification, dietary therapy or pharmacologic therapy may be warranted, if clinically indicated. 4. There are calcifications of the aortic valve. Echocardiographic correlation for evaluation of potential valvular dysfunction may be warranted if clinically indicated. 5. Diffuse bronchial wall thickening with moderate centrilobular and paraseptal emphysema; imaging findings suggestive of underlying COPD.    02/03/2017 Initial Biopsy    (R) neck lymph node biopsy: SMALL CELL CARCINOMA (most likely lung primary).     02/03/2017 Miscellaneous    Port-a-cath attempted by IR; unable to place d/t enlarged SVC.     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 Staging for metastatic disease  EXAM: MRI HEAD WITHOUT AND WITH CONTRAST  TECHNIQUE: Multiplanar, multiecho pulse sequences of the brain and surrounding structures were obtained without and with intravenous contrast.  CONTRAST:  15mL MULTIHANCE GADOBENATE DIMEGLUMINE 529 MG/ML IV SOLN  COMPARISON:  None.  FINDINGS: Brain: Negative for hydrocephalus. Cerebral volume normal for age. Small nonenhancing white matter hyperintensities consistent with mild chronic microvascular ischemia. No acute infarct. Negative for hemorrhage or mass or edema  Normal enhancement  postcontrast infusion. No enhancing mass lesion. Leptomeningeal enhancement is normal.  Vascular: Normal arterial flow voids.  Normal venous enhancement  Skull and upper cervical spine: Negative  Sinuses/Orbits: Negative  Other: None  IMPRESSION: Negative for metastatic disease.  No acute abnormality.  Mild chronic white matter changes.    04/07/2017 Imaging     PET:  1. Marked reduction in size and metabolic activity of bulky RIGHT supraclavicular adenopathy mediastinal lymphadenopathy. 2. Residual moderate activity remains within small RIGHT supraclavicular lymph node, RIGHT lower paratracheal lymph node and RIGHT hilar lymph node. 3. Resolution of prevascular and internal mammary mediastinal metastatic hypermetabolic activity. 4. Resolution of metabolic activity associated with solitary RIGHT hepatic lobe liver metastasis. 5. No evidence of disease progression. 6. 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    05/27/2017 Imaging    MRI brain w/ and w/o contrast IMPRESSION: 1. No metastatic disease identified. 2. Increased nonspecific cerebral white matter signal changes since August. These are most commonly small vessel disease related. 3. New right maxillary sinusitis. Benign appearing retention cysts in the nasopharynx with trace mastoid effusions.    06/12/2017 Imaging    PET-CT IMPRESSION: 1. There are two new hypermetabolic nodules identified within both lower lobes measuring up   to 3.1 cm. The appearance is nonspecific and may be inflammatory/infectious in etiology. Pulmonary metastatic disease cannot be excluded and short-term follow-up imaging in 3 months is advised to reassess these nodules. 2. Stable appearance of mild hypermetabolic activity associated with right paratracheal and right hilar lymph nodes. 3. Decrease in FDG uptake associated with index  right supraclavicular lymph node. 4. 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 5. Aortic Atherosclerosis (ICD10-I70.0) and Emphysema (ICD10-J43.9).    08/19/2017 -  Chemotherapy    The patient had pegfilgrastim (NEULASTA ONPRO KIT) injection 6 mg, 6 mg, Subcutaneous, Once, 10 of 10 cycles Administration: 6 mg (08/28/2017), 6 mg (09/18/2017), 6 mg (10/09/2017), 6 mg (11/02/2017), 6 mg (11/20/2017), 6 mg (12/18/2017), 6 mg (01/15/2018), 6 mg (02/12/2018), 6 mg (03/12/2018) topotecan (HYCAMTIN) 2.9 mg in sodium chloride 0.9 % 100 mL chemo infusion, 1.5 mg/m2 = 2.9 mg, Intravenous,  Once, 10 of 10 cycles Dose modification: 1.2 mg/m2 (80 % of original dose 1.5 mg/m2, Cycle 4, Reason: Dose Not Tolerated) Administration: 2.9 mg (08/24/2017), 2.9 mg (08/25/2017), 2.9 mg (08/28/2017), 2.9 mg (08/26/2017), 2.9 mg (08/27/2017), 2.9 mg (09/14/2017), 2.9 mg (09/15/2017), 2.9 mg (09/16/2017), 2.9 mg (09/17/2017), 2.9 mg (09/18/2017), 2.9 mg (10/05/2017), 2.9 mg (10/06/2017), 2.9 mg (10/07/2017), 2.9 mg (10/08/2017), 2.9 mg (10/09/2017), 2.3 mg (10/26/2017), 2.3 mg (10/27/2017), 2.3 mg (10/28/2017), 2.3 mg (10/29/2017), 2.3 mg (11/02/2017), 2.3 mg (11/16/2017), 2.3 mg (11/17/2017), 2.3 mg (11/18/2017), 2.3 mg (11/19/2017), 2.3 mg (11/20/2017), 2.3 mg (12/14/2017), 2.3 mg (12/15/2017), 2.3 mg (12/16/2017), 2.3 mg (12/17/2017), 2.3 mg (12/18/2017), 2.3 mg (01/11/2018), 2.3 mg (01/12/2018), 2.3 mg (01/13/2018), 2.3 mg (01/15/2018), 2.3 mg (02/08/2018), 2.3 mg (02/09/2018), 2.3 mg (02/10/2018), 2.3 mg (02/11/2018), 2.3 mg (02/12/2018), 2.3 mg (03/08/2018), 2.3 mg (03/09/2018), 2.3 mg (03/10/2018), 2.3 mg (03/11/2018), 2.3 mg (03/12/2018), 2.2 mg (04/05/2018) ondansetron (ZOFRAN) 4 mg in sodium chloride 0.9 % 50 mL IVPB, , Intravenous,  Once, 5 of 5 cycles Administration:  (02/08/2018),  (02/09/2018),  (02/10/2018),  (02/11/2018),  (02/12/2018),  (03/08/2018),  (03/09/2018),  (03/10/2018),  (03/11/2018),   (03/12/2018),  (04/05/2018)  for chemotherapy treatment.       Problem List Patient Active Problem List   Diagnosis Date Noted  . 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 Lymphadenopathy of head and neck [R59.1] 01/22/2017  . Osteopenia [M85.80] 03/30/2015  . Clubbing of fingers [R68.3] 04/14/2014  . Hyperglycemia [R73.9] 10/20/2013  . Smoker [F17.200]   . Closed dislocation of acromioclavicular joint [S43.109A] 04/11/2010  . BREAST MASS, LEFT [N63.0] 01/20/2008  . ONYCHOMYCOSIS, TOENAILS [B35.1] 10/20/2007  . MUSCLE CRAMPS [R25.2] 10/20/2007  . Hypercholesterolemia [E78.00] 05/28/2007  . CATARACT NOS [H26.9] 08/28/2006  . DISTURBANCE, VISUAL NOS [H53.9] 08/19/2006  . 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 WITHDRAWAL, DRUG [F19.939] 06/24/2006  . ANXIETY [F41.1] 06/24/2006  . DEPRESSION [F32.9] 06/24/2006  . MYOCARDIAL INFARCTION, HX OF [I25.2] 06/24/2006  . Coronary atherosclerosis [I25.10] 06/24/2006  . CARDIAC ARRHYTHMIA [I49.9] 06/24/2006  . CONSTIPATION [K59.00] 06/24/2006  . OSTEOARTHRITIS [M19.90] 06/24/2006  . LOW BACK PAIN [M54.5] 06/24/2006  . INSOMNIA [G47.00] 06/24/2006    Past Medical History Past Medical History:  Diagnosis Date  . Anxiety   . CAD (coronary artery disease)   . Cancer ([place])    stage 4 small cell lung cancer   . COPD (chronic obstructive pulmonary disease) ([place])   . Depression   .  Dyspnea    increased exertion  . Feeling of chest tightness   . Heart palpitations   . History of chemotherapy   . Myocardial infarction (HCC)   . Osteopenia   . Panic attacks   . Smoker     Past Surgical History Past Surgical History:  Procedure Laterality Date  . BACK SURGERY  12/24/2000   L5,S1  . CORONARY STENT PLACEMENT  2005   RCA & CX  . HERNIA REPAIR Right 1980's  . INGUINAL HERNIA REPAIR  12/1978   right side  . IR FLUORO GUIDE PORT INSERTION RIGHT   04/02/2017  . IR US GUIDE BX ASP/DRAIN  02/03/2017  . IR US GUIDE VASC ACCESS RIGHT  04/02/2017  . NM MYOCAR PERF WALL MOTION  09/07/2009   No ischemia; EF 51%  . SHOULDER SURGERY Left 08/2010  . SPINE SURGERY  2002   L5-S1    Family History Family History  Problem Relation Age of Onset  . Heart attack Father   . Kidney disease Father        renal failure  . Heart failure Mother   . Heart attack Mother   . Cancer Brother   . Diabetes Brother   . Alcohol abuse Brother   . Diabetes Sister      Social History  reports that he has been smoking cigarettes. He has been smoking about 1.00 pack per day. He has never used smokeless tobacco. He reports that he drinks alcohol. He reports that he does not use drugs.  Medications  Current Outpatient Medications:  .  ALPRAZolam (XANAX) 1 MG tablet, TAKE (1) TABLET BY MOUTH (4) TIMES DAILY., Disp: 120 tablet, Rfl: 0 .  atorvastatin (LIPITOR) 10 MG tablet, TAKE 1 TABLET AT BEDTIME  (DISCONTINUE ATORVASTATIN 20MG), Disp: 90 tablet, Rfl: 3 .  clotrimazole-betamethasone (LOTRISONE) cream, Apply 1 application topically 2 (two) times daily., Disp: 30 g, Rfl: 0 .  cyclobenzaprine (FLEXERIL) 5 MG tablet, TAKE 1 TABLET THREE TIMES DAILY AS NEEDED FOR MUSCLE SPASMS., Disp: 30 tablet, Rfl: 0 .  cyclobenzaprine (FLEXERIL) 5 MG tablet, TAKE 1 TABLET THREE TIMES DAILY AS NEEDED FOR MUSCLE SPASMS., Disp: 30 tablet, Rfl: 0 .  dexamethasone (DECADRON) 4 MG tablet, , Disp: , Rfl:  .  gabapentin (NEURONTIN) 300 MG capsule, Take 1 capsule (300 mg total) by mouth 2 (two) times daily., Disp: 60 capsule, Rfl: 4 .  HYDROcodone-acetaminophen (NORCO) 5-325 MG tablet, Take 1 tablet by mouth every 6 (six) hours as needed for moderate pain., Disp: 75 tablet, Rfl: 0 .  lidocaine-prilocaine (EMLA) cream, Apply to affected area once, Disp: 30 g, Rfl: 3 .  ondansetron (ZOFRAN) 8 MG tablet, Take 1 tablet (8 mg total) by mouth 2 (two) times daily as needed. Start on the third day  after cisplatin chemotherapy., Disp: 30 tablet, Rfl: 1 .  Pegfilgrastim (NEULASTA ONPRO [place]), Inject into the skin. Every 21 days, Disp: , Rfl:  .  polyethylene glycol powder (GLYCOLAX/MIRALAX) powder, Take 17 g by mouth daily., Disp: 3350 g, Rfl: 2 .  prochlorperazine (COMPAZINE) 10 MG tablet, Take 1 tablet (10 mg total) by mouth every 6 (six) hours as needed (Nausea or vomiting)., Disp: 60 tablet, Rfl: 1 .  TOPOTECAN HCL IV, Inject into the vein., Disp: , Rfl:  .  topotecan in sodium chloride 0.9 % 100 mL, Inject into the vein once. Days 1-5 every 21 days, Disp: , Rfl:   Allergies Codeine and Niaspan [niacin er]  Review of Systems Review   of Systems - Oncology ROS neuropathy and vision changes.     Physical Exam  Vitals Wt Readings from Last 3 Encounters:  04/05/18 148 lb 12.8 oz (67.5 kg)  03/10/18 146 lb 12.8 oz (66.6 kg)  03/08/18 145 lb 14.4 oz (66.2 kg)   Temp Readings from Last 3 Encounters:  04/05/18 97.6 F (36.4 C) (Oral)  04/05/18 97.7 F (36.5 C) (Oral)  03/12/18 (!) 97.5 F (36.4 C) (Oral)   BP Readings from Last 3 Encounters:  04/05/18 (!) 130/55  04/05/18 139/61  03/12/18 132/65   Pulse Readings from Last 3 Encounters:  04/05/18 75  04/05/18 (!) 58  03/12/18 74    Constitutional: Well-developed, well-nourished, and in no distress.   HENT: Head: Normocephalic and atraumatic.  Mouth/Throat: No oropharyngeal exudate. Mucosa moist. Eyes: Pupils are equal, round, and reactive to light. Conjunctivae are normal. No scleral icterus.  Neck: Normal range of motion. Neck supple. No JVD present.  Cardiovascular: Normal rate, regular rhythm and normal heart sounds.  Exam reveals no gallop and no friction rub.   No murmur heard. Pulmonary/Chest: Effort normal and breath sounds normal. No respiratory distress. No wheezes.No rales.  Abdominal: Soft. Bowel sounds are normal. No distension. There is no tenderness. There is no guarding.  Musculoskeletal: No edema or  tenderness.  Lymphadenopathy: No cervical, axillary or supraclavicular adenopathy.  Neurological: Alert and oriented to person, place, and time. No cranial nerve deficit.  Skin: Skin is warm and dry. No rash noted. No erythema. No pallor.  Psychiatric: Affect and judgment normal.   Labs Appointment on 04/05/2018  Component Date Value Ref Range Status  . WBC 04/05/2018 9.3  4.0 - 10.5 K/uL Final  . RBC 04/05/2018 3.39* 4.22 - 5.81 MIL/uL Final  . Hemoglobin 04/05/2018 10.8* 13.0 - 17.0 g/dL Final  . HCT 04/05/2018 33.0* 39.0 - 52.0 % Final  . MCV 04/05/2018 97.3  78.0 - 100.0 fL Final  . MCH 04/05/2018 31.9  26.0 - 34.0 pg Final  . MCHC 04/05/2018 32.7  30.0 - 36.0 g/dL Final  . RDW 04/05/2018 21.8* 11.5 - 15.5 % Final  . Platelets 04/05/2018 347  150 - 400 K/uL Final  . Neutrophils Relative % 04/05/2018 68  % Final  . Neutro Abs 04/05/2018 6.3  1.7 - 7.7 K/uL Final  . Lymphocytes Relative 04/05/2018 17  % Final  . Lymphs Abs 04/05/2018 1.6  0.7 - 4.0 K/uL Final  . Monocytes Relative 04/05/2018 14  % Final  . Monocytes Absolute 04/05/2018 1.3* 0.1 - 1.0 K/uL Final  . Eosinophils Relative 04/05/2018 1  % Final  . Eosinophils Absolute 04/05/2018 0.1  0.0 - 0.7 K/uL Final  . Basophils Relative 04/05/2018 0  % Final  . Basophils Absolute 04/05/2018 0.0  0.0 - 0.1 K/uL Final   Performed at [facility], [address], [place], [place] [address]  . Sodium 04/05/2018 140  135 - 145 mmol/L Final  . Potassium 04/05/2018 4.4  3.5 - 5.1 mmol/L Final  . Chloride 04/05/2018 108  98 - 111 mmol/L Final  . CO2 04/05/2018 23  22 - 32 mmol/L Final  . Glucose, Bld 04/05/2018 97  70 - 99 mg/dL Final  . BUN 04/05/2018 17  8 - 23 mg/dL Final  . Creatinine, Ser 04/05/2018 0.81  0.61 - 1.24 mg/dL Final  . Calcium 04/05/2018 9.1  8.9 - 10.3 mg/dL Final  . Total Protein 04/05/2018 7.2  6.5 - 8.1 g/dL Final  . Albumin 04/05/2018 4.2  3.5 - 5.0 g/dL Final  . AST 04/05/2018 14* 15 - 41 U/L Final  . ALT  04/05/2018 10  0 - 44 U/L Final  . Alkaline Phosphatase 04/05/2018 63  38 - 126 U/L Final  . Total Bilirubin 04/05/2018 0.2* 0.3 - 1.2 mg/dL Final  . GFR calc non Af Amer 04/05/2018 >60  >60 mL/min Final  . GFR calc Af Amer 04/05/2018 >60  >60 mL/min Final   Comment: (NOTE) The eGFR has been calculated using the CKD EPI equation. This calculation has not been validated in all clinical situations. eGFR's persistently <60 mL/min signify possible Chronic Kidney Disease.   Georgiann Hahn gap 04/05/2018 9  5 - 15 Final   Performed at [facility], [address], [place], [place] [address]     Pathology Orders Placed This Encounter  Procedures  . CBC with Differential/Platelet    Standing Status:   Future    Standing Expiration Date:   04/06/2019  . Comprehensive metabolic panel    Standing Status:   Future    Standing Expiration Date:   04/06/2019  . Lactate dehydrogenase    Standing Status:   Future    Standing Expiration Date:   04/06/2019       Zoila Shutter MD

## 2018-04-05 NOTE — Patient Instructions (Signed)
[facility] Discharge Instructions for Patients Receiving Chemotherapy   Beginning January 23rd 2017 lab work for the [facility] will be done in the  Main lab at [facility] on 1st floor. If you have a lab appointment with the [facility] please come in thru the  Main Entrance and check in at the main information desk   Today you received the following chemotherapy agents: Topotecan. Follow up as scheduled. Call clinic for any questions or concerns.   If you develop nausea and vomiting, or diarrhea that is not controlled by your medication, call the clinic.  The clinic phone number is [phone]. Office hours are Monday-Friday 8:30am-5:00pm.  BELOW ARE SYMPTOMS THAT SHOULD BE REPORTED IMMEDIATELY:  *FEVER GREATER THAN 101.0 F  *CHILLS WITH OR WITHOUT FEVER  NAUSEA AND VOMITING THAT IS NOT CONTROLLED WITH YOUR NAUSEA MEDICATION  *UNUSUAL SHORTNESS OF BREATH  *UNUSUAL BRUISING OR BLEEDING  TENDERNESS IN MOUTH AND THROAT WITH OR WITHOUT PRESENCE OF ULCERS  *URINARY PROBLEMS  *BOWEL PROBLEMS  UNUSUAL RASH Items with * indicate a potential emergency and should be followed up as soon as possible. If you have an emergency after office hours please contact your primary care physician or go to the nearest emergency department.  Please call the clinic during office hours if you have any questions or concerns.   You may also contact the Patient Navigator at [phone] should you have any questions or need assistance in obtaining follow up care.      Resources For Cancer Patients and their Caregivers ? American Cancer Society: Can assist with transportation, wigs, general needs, runs Look Good Feel Better.        859-414-3732 ? Cancer Care: Provides financial assistance, online support groups, medication/co-pay assistance.  1-800-813-HOPE (367)182-3769) ? [facility] Assists [place] cancer patients and their families through  emotional , educational and financial support.  607-357-4971 ? [place] DSS Where to apply for food stamps, Medicaid and utility assistance. (531)394-1843 ? [facility]: Transportation to medical appointments. 518-226-9443 ? Social Security Administration: May apply for disability if have a Stage IV cancer. ([phone] [phone] ? [employer], Disability and Transit Services: Assists with nutrition, care and transit needs. [phone]

## 2018-04-06 ENCOUNTER — Inpatient Hospital Stay (HOSPITAL_COMMUNITY): Payer: Medicare PPO

## 2018-04-06 ENCOUNTER — Encounter (HOSPITAL_COMMUNITY): Payer: Self-pay

## 2018-04-06 ENCOUNTER — Ambulatory Visit (HOSPITAL_COMMUNITY): Payer: Medicare PPO

## 2018-04-06 VITALS — BP 133/57 | HR 65 | Temp 97.3°F | Resp 18 | Wt 148.0 lb

## 2018-04-06 DIAGNOSIS — C787 Secondary malignant neoplasm of liver and intrahepatic bile duct: Secondary | ICD-10-CM | POA: Diagnosis not present

## 2018-04-06 DIAGNOSIS — C3490 Malignant neoplasm of unspecified part of unspecified bronchus or lung: Secondary | ICD-10-CM | POA: Diagnosis not present

## 2018-04-06 DIAGNOSIS — Z23 Encounter for immunization: Secondary | ICD-10-CM | POA: Diagnosis not present

## 2018-04-06 DIAGNOSIS — C770 Secondary and unspecified malignant neoplasm of lymph nodes of head, face and neck: Secondary | ICD-10-CM | POA: Diagnosis not present

## 2018-04-06 DIAGNOSIS — Z5111 Encounter for antineoplastic chemotherapy: Secondary | ICD-10-CM | POA: Diagnosis not present

## 2018-04-06 MED ORDER — SODIUM CHLORIDE 0.9 % IV SOLN
Freq: Once | INTRAVENOUS | Status: AC
  Administered 2018-04-06: 11:00:00 via INTRAVENOUS
  Filled 2018-04-06: qty 2

## 2018-04-06 MED ORDER — SODIUM CHLORIDE 0.9% FLUSH
10.0000 mL | INTRAVENOUS | Status: DC | PRN
  Administered 2018-04-06: 10 mL
  Filled 2018-04-06: qty 10

## 2018-04-06 MED ORDER — TOPOTECAN HCL CHEMO INJECTION 4 MG
1.2000 mg/m2 | Freq: Once | INTRAVENOUS | Status: AC
  Administered 2018-04-06: 2.2 mg via INTRAVENOUS
  Filled 2018-04-06: qty 2.2

## 2018-04-06 MED ORDER — SODIUM CHLORIDE 0.9 % IV SOLN
Freq: Once | INTRAVENOUS | Status: AC
  Administered 2018-04-06: 11:00:00 via INTRAVENOUS

## 2018-04-06 NOTE — Progress Notes (Signed)
Harvie Junior tolerated Topotecan infusion well without complaints or incident. VSS upon discharge. Port left accessed and flushed for use tomorrow.Pt discharged self ambulatory in satisfactory condition

## 2018-04-06 NOTE — Patient Instructions (Signed)
[facility] Discharge Instructions for Patients Receiving Chemotherapy   Beginning January 23rd 2017 lab work for the [facility] will be done in the  Main lab at [facility] on 1st floor. If you have a lab appointment with the [facility] please come in thru the  Main Entrance and check in at the main information desk   Today you received the following chemotherapy agents Topotecan. Follow-up as scheduled. Call clinic for any questions or concerns  To help prevent nausea and vomiting after your treatment, we encourage you to take your nausea medication   If you develop nausea and vomiting, or diarrhea that is not controlled by your medication, call the clinic.  The clinic phone number is [phone]. Office hours are Monday-Friday 8:30am-5:00pm.  BELOW ARE SYMPTOMS THAT SHOULD BE REPORTED IMMEDIATELY:  *FEVER GREATER THAN 101.0 F  *CHILLS WITH OR WITHOUT FEVER  NAUSEA AND VOMITING THAT IS NOT CONTROLLED WITH YOUR NAUSEA MEDICATION  *UNUSUAL SHORTNESS OF BREATH  *UNUSUAL BRUISING OR BLEEDING  TENDERNESS IN MOUTH AND THROAT WITH OR WITHOUT PRESENCE OF ULCERS  *URINARY PROBLEMS  *BOWEL PROBLEMS  UNUSUAL RASH Items with * indicate a potential emergency and should be followed up as soon as possible. If you have an emergency after office hours please contact your primary care physician or go to the nearest emergency department.  Please call the clinic during office hours if you have any questions or concerns.   You may also contact the Patient Navigator at [phone] should you have any questions or need assistance in obtaining follow up care.      Resources For Cancer Patients and their Caregivers ? American Cancer Society: Can assist with transportation, wigs, general needs, runs Look Good Feel Better.        317-045-9847 ? Cancer Care: Provides financial assistance, online support groups, medication/co-pay assistance.  1-800-813-HOPE  307-521-7538) ? [facility] Assists [place] cancer patients and their families through emotional , educational and financial support.  (717) 858-9281 ? [place] DSS Where to apply for food stamps, Medicaid and utility assistance. 212-527-1458 ? RCATS: Transportation to medical appointments. 310-067-2112 ? Social Security Administration: May apply for disability if have a Stage IV cancer. 906-620-5677 617-274-2047 ? [employer], Disability and Transit Services: Assists with nutrition, care and transit needs. 757-451-2395

## 2018-04-07 ENCOUNTER — Encounter (HOSPITAL_COMMUNITY): Payer: Self-pay

## 2018-04-07 ENCOUNTER — Inpatient Hospital Stay (HOSPITAL_COMMUNITY): Payer: Medicare PPO

## 2018-04-07 VITALS — BP 126/52 | HR 61 | Temp 97.7°F | Resp 18 | Wt 147.6 lb

## 2018-04-07 DIAGNOSIS — C3490 Malignant neoplasm of unspecified part of unspecified bronchus or lung: Secondary | ICD-10-CM | POA: Diagnosis not present

## 2018-04-07 DIAGNOSIS — C770 Secondary and unspecified malignant neoplasm of lymph nodes of head, face and neck: Secondary | ICD-10-CM | POA: Diagnosis not present

## 2018-04-07 DIAGNOSIS — Z5111 Encounter for antineoplastic chemotherapy: Secondary | ICD-10-CM | POA: Diagnosis not present

## 2018-04-07 DIAGNOSIS — Z23 Encounter for immunization: Secondary | ICD-10-CM | POA: Diagnosis not present

## 2018-04-07 DIAGNOSIS — C787 Secondary malignant neoplasm of liver and intrahepatic bile duct: Secondary | ICD-10-CM | POA: Diagnosis not present

## 2018-04-07 MED ORDER — SODIUM CHLORIDE 0.9 % IV SOLN
Freq: Once | INTRAVENOUS | Status: AC
  Administered 2018-04-07: 14:00:00 via INTRAVENOUS
  Filled 2018-04-07: qty 2

## 2018-04-07 MED ORDER — SODIUM CHLORIDE 0.9% FLUSH
10.0000 mL | INTRAVENOUS | Status: DC | PRN
  Administered 2018-04-07: 10 mL
  Filled 2018-04-07: qty 10

## 2018-04-07 MED ORDER — SODIUM CHLORIDE 0.9 % IV SOLN
Freq: Once | INTRAVENOUS | Status: AC
  Administered 2018-04-07: 14:00:00 via INTRAVENOUS

## 2018-04-07 MED ORDER — TOPOTECAN HCL CHEMO INJECTION 4 MG
1.2000 mg/m2 | Freq: Once | INTRAVENOUS | Status: AC
  Administered 2018-04-07: 2.2 mg via INTRAVENOUS
  Filled 2018-04-07: qty 2.2

## 2018-04-07 NOTE — Progress Notes (Signed)
[name] tolerated Topotecan infusion well without complaints or incident. Port left accessed for use tomorrow. VSS upon discharge. Pt discharged self ambulatory in satisfactory condition

## 2018-04-07 NOTE — Patient Instructions (Signed)
[facility] Discharge Instructions for Patients Receiving Chemotherapy   Beginning January 23rd 2017 lab work for the [facility] will be done in the  Main lab at [facility] on 1st floor. If you have a lab appointment with the [facility] please come in thru the  Main Entrance and check in at the main information desk   Today you received the following chemotherapy agents Topotecan. Follow-up as scheduled. Call clinic for any questions or concerns  To help prevent nausea and vomiting after your treatment, we encourage you to take your nausea medication   If you develop nausea and vomiting, or diarrhea that is not controlled by your medication, call the clinic.  The clinic phone number is [phone]. Office hours are Monday-Friday 8:30am-5:00pm.  BELOW ARE SYMPTOMS THAT SHOULD BE REPORTED IMMEDIATELY:  *FEVER GREATER THAN 101.0 F  *CHILLS WITH OR WITHOUT FEVER  NAUSEA AND VOMITING THAT IS NOT CONTROLLED WITH YOUR NAUSEA MEDICATION  *UNUSUAL SHORTNESS OF BREATH  *UNUSUAL BRUISING OR BLEEDING  TENDERNESS IN MOUTH AND THROAT WITH OR WITHOUT PRESENCE OF ULCERS  *URINARY PROBLEMS  *BOWEL PROBLEMS  UNUSUAL RASH Items with * indicate a potential emergency and should be followed up as soon as possible. If you have an emergency after office hours please contact your primary care physician or go to the nearest emergency department.  Please call the clinic during office hours if you have any questions or concerns.   You may also contact the Patient Navigator at [phone] should you have any questions or need assistance in obtaining follow up care.      Resources For Cancer Patients and their Caregivers ? American Cancer Society: Can assist with transportation, wigs, general needs, runs Look Good Feel Better.        317-045-9847 ? Cancer Care: Provides financial assistance, online support groups, medication/co-pay assistance.  1-800-813-HOPE  307-521-7538) ? [facility] Assists [place] cancer patients and their families through emotional , educational and financial support.  (717) 858-9281 ? [place] DSS Where to apply for food stamps, Medicaid and utility assistance. 212-527-1458 ? RCATS: Transportation to medical appointments. 310-067-2112 ? Social Security Administration: May apply for disability if have a Stage IV cancer. 906-620-5677 617-274-2047 ? [employer], Disability and Transit Services: Assists with nutrition, care and transit needs. 757-451-2395

## 2018-04-08 ENCOUNTER — Inpatient Hospital Stay (HOSPITAL_COMMUNITY): Payer: Medicare PPO

## 2018-04-08 VITALS — BP 111/56 | HR 70 | Temp 97.7°F | Resp 18 | Wt 147.8 lb

## 2018-04-08 DIAGNOSIS — C3490 Malignant neoplasm of unspecified part of unspecified bronchus or lung: Secondary | ICD-10-CM | POA: Diagnosis not present

## 2018-04-08 DIAGNOSIS — C770 Secondary and unspecified malignant neoplasm of lymph nodes of head, face and neck: Secondary | ICD-10-CM | POA: Diagnosis not present

## 2018-04-08 DIAGNOSIS — C787 Secondary malignant neoplasm of liver and intrahepatic bile duct: Secondary | ICD-10-CM | POA: Diagnosis not present

## 2018-04-08 DIAGNOSIS — Z5111 Encounter for antineoplastic chemotherapy: Secondary | ICD-10-CM | POA: Diagnosis not present

## 2018-04-08 DIAGNOSIS — Z23 Encounter for immunization: Secondary | ICD-10-CM | POA: Diagnosis not present

## 2018-04-08 MED ORDER — SODIUM CHLORIDE 0.9 % IV SOLN
Freq: Once | INTRAVENOUS | Status: AC
  Administered 2018-04-08: 14:00:00 via INTRAVENOUS
  Filled 2018-04-08: qty 2

## 2018-04-08 MED ORDER — SODIUM CHLORIDE 0.9 % IV SOLN
Freq: Once | INTRAVENOUS | Status: AC
  Administered 2018-04-08: 13:00:00 via INTRAVENOUS

## 2018-04-08 MED ORDER — SODIUM CHLORIDE 0.9% FLUSH
10.0000 mL | INTRAVENOUS | Status: DC | PRN
  Administered 2018-04-08: 10 mL
  Filled 2018-04-08: qty 10

## 2018-04-08 MED ORDER — TOPOTECAN HCL CHEMO INJECTION 4 MG
1.2000 mg/m2 | Freq: Once | INTRAVENOUS | Status: AC
  Administered 2018-04-08: 2.2 mg via INTRAVENOUS
  Filled 2018-04-08: qty 2.2

## 2018-04-09 ENCOUNTER — Ambulatory Visit (HOSPITAL_COMMUNITY): Payer: Medicare PPO

## 2018-04-09 ENCOUNTER — Encounter (HOSPITAL_COMMUNITY): Payer: Medicare PPO

## 2018-04-09 ENCOUNTER — Inpatient Hospital Stay (HOSPITAL_COMMUNITY): Payer: Medicare PPO

## 2018-04-09 VITALS — BP 142/60 | HR 92 | Temp 97.7°F | Resp 16

## 2018-04-09 DIAGNOSIS — Z5111 Encounter for antineoplastic chemotherapy: Secondary | ICD-10-CM | POA: Diagnosis not present

## 2018-04-09 DIAGNOSIS — C787 Secondary malignant neoplasm of liver and intrahepatic bile duct: Secondary | ICD-10-CM | POA: Diagnosis not present

## 2018-04-09 DIAGNOSIS — C3490 Malignant neoplasm of unspecified part of unspecified bronchus or lung: Secondary | ICD-10-CM

## 2018-04-09 DIAGNOSIS — Z23 Encounter for immunization: Secondary | ICD-10-CM | POA: Diagnosis not present

## 2018-04-09 DIAGNOSIS — C770 Secondary and unspecified malignant neoplasm of lymph nodes of head, face and neck: Secondary | ICD-10-CM | POA: Diagnosis not present

## 2018-04-09 MED ORDER — SODIUM CHLORIDE 0.9 % IV SOLN
Freq: Once | INTRAVENOUS | Status: AC
  Administered 2018-04-09: 10:00:00 via INTRAVENOUS

## 2018-04-09 MED ORDER — SODIUM CHLORIDE 0.9 % IV SOLN
Freq: Once | INTRAVENOUS | Status: AC
  Administered 2018-04-09: 10:00:00 via INTRAVENOUS
  Filled 2018-04-09: qty 2

## 2018-04-09 MED ORDER — TOPOTECAN HCL CHEMO INJECTION 4 MG
1.2000 mg/m2 | Freq: Once | INTRAVENOUS | Status: AC
  Administered 2018-04-09: 2.2 mg via INTRAVENOUS
  Filled 2018-04-09: qty 2.2

## 2018-04-09 MED ORDER — PEGFILGRASTIM 6 MG/0.6ML ~~LOC~~ PSKT
6.0000 mg | PREFILLED_SYRINGE | Freq: Once | SUBCUTANEOUS | Status: AC
  Administered 2018-04-09: 6 mg via SUBCUTANEOUS
  Filled 2018-04-09: qty 0.6

## 2018-04-09 MED ORDER — INFLUENZA VAC SPLIT HIGH-DOSE 0.5 ML IM SUSY
0.5000 mL | PREFILLED_SYRINGE | Freq: Once | INTRAMUSCULAR | Status: AC
  Administered 2018-04-09: 0.5 mL via INTRAMUSCULAR

## 2018-04-09 MED ORDER — HEPARIN SOD (PORK) LOCK FLUSH 100 UNIT/ML IV SOLN
500.0000 [IU] | Freq: Once | INTRAVENOUS | Status: AC | PRN
  Administered 2018-04-09: 500 [IU]
  Filled 2018-04-09: qty 5

## 2018-04-09 MED ORDER — SODIUM CHLORIDE 0.9% FLUSH
10.0000 mL | INTRAVENOUS | Status: DC | PRN
  Administered 2018-04-09: 10 mL
  Filled 2018-04-09: qty 10

## 2018-04-09 NOTE — Progress Notes (Signed)
High dose influenza vaccine given today per MD.   .[name] arrived today for [facility] neulasta on body injector. See MAR for administration details. Injector in place and engaged with green light indicator on flashing. Tolerated application with out problems.   Treatment given per orders. Patient tolerated it well without problems. Vitals stable and discharged home from clinic ambulatory. Follow up as scheduled.

## 2018-04-09 NOTE — Progress Notes (Signed)
Nutrition Follow-up:  Patient with extensive small cell lung cancer.  Patient receiving chemotherapy.   Met with patient today during infusion.  Patient reports appetite is fairly good.  Reports that he is maintaining weight.  Tries to drink smoothie 2 times per day. Also eating 3 meals per day as well as smoothie between meals.  Feels better and reports tumor has shrunk.  Wants to continue to focus on nutrition to keep him coming back to treatments.  Constipation has improved.    Medications: reviewed  Labs: reviewed  Anthropometrics:   Weight has been stable at 147 lb today and 148 lb on 6/5  Pt reports UBW before starting steroids was 145-155 lb.  Steriods caused him to gain weight  NUTRITION DIAGNOSIS: Malnutrition continues   MALNUTRITION DIAGNOSIS: severe malnutrition stable   INTERVENTION:  Encouraged patient to continue with high calorie, high protein smoothie.  Wanted to hold off with additional case of ensure today. Encouraged continuing high calorie, high protein foods to help maintain weight    MONITORING, EVALUATION, GOAL: weight trends, intake   NEXT VISIT: October 25 during infusion  Joli B. Zenia Resides, [place], [place] Registered Dietitian [phone] (pager)

## 2018-04-09 NOTE — Patient Instructions (Signed)
[facility] Discharge Instructions for Patients Receiving Chemotherapy   Beginning January 23rd 2017 lab work for the Cancer Center will be done in the  Main lab at [facility] on 1st floor. If you have a lab appointment with the Cancer Center please come in thru the  Main Entrance and check in at the main information desk   Today you received the following chemotherapy agents   To help prevent nausea and vomiting after your treatment, we encourage you to take your nausea medication     If you develop nausea and vomiting, or diarrhea that is not controlled by your medication, call the clinic.  The clinic phone number is [phone]. Office hours are Monday-Friday 8:30am-5:00pm.  BELOW ARE SYMPTOMS THAT SHOULD BE REPORTED IMMEDIATELY:  *FEVER GREATER THAN 101.0 F  *CHILLS WITH OR WITHOUT FEVER  NAUSEA AND VOMITING THAT IS NOT CONTROLLED WITH YOUR NAUSEA MEDICATION  *UNUSUAL SHORTNESS OF BREATH  *UNUSUAL BRUISING OR BLEEDING  TENDERNESS IN MOUTH AND THROAT WITH OR WITHOUT PRESENCE OF ULCERS  *URINARY PROBLEMS  *BOWEL PROBLEMS  UNUSUAL RASH Items with * indicate a potential emergency and should be followed up as soon as possible. If you have an emergency after office hours please contact your primary care physician or go to the nearest emergency department.  Please call the clinic during office hours if you have any questions or concerns.   You may also contact the Patient Navigator at [phone] should you have any questions or need assistance in obtaining follow up care.      Resources For Cancer Patients and their Caregivers ? American Cancer Society: Can assist with transportation, wigs, general needs, runs Look Good Feel Better.        1-888-227-6333 ? Cancer Care: Provides financial assistance, online support groups, medication/co-pay assistance.  1-800-813-HOPE (4673) ? [facility] Assists [place] cancer  patients and their families through emotional , educational and financial support.  336-427-4357 ? [place] DSS Where to apply for food stamps, Medicaid and utility assistance. 336-342-1394 ? RCATS: Transportation to medical appointments. 336-347-2287 ? Social Security Administration: May apply for disability if have a Stage IV cancer. 336-342-7796 1-800-772-1213 ? [place] Aging, Disability and Transit Services: Assists with nutrition, care and transit needs. 336-349-2343         

## 2018-04-13 ENCOUNTER — Other Ambulatory Visit (HOSPITAL_COMMUNITY): Payer: Self-pay | Admitting: Hematology

## 2018-04-14 ENCOUNTER — Other Ambulatory Visit: Payer: Self-pay | Admitting: Family Medicine

## 2018-04-14 NOTE — Telephone Encounter (Signed)
Pt is requesting refill on Xanax   LOV: 05/19/17  LRF:   03/16/18

## 2018-04-30 NOTE — Assessment & Plan Note (Addendum)
Stage IV small cell lung cancer, biopsy-proven on needle core biopsy of right neck LN on 02/03/2017. Staging PET scan on 02/02/2018 demonstrated hypermetabolic right cervical, bilateral supraclavicular, mediastinal, right hilar, and right internal mammary lymphadenopathy with hypermetabolic right liver lesion. Baseline MRI brain on 02/11/2017 was negative for intracranial disease. He is S/P Cisplatin/Etoposide x 6 cycles (02/09/2017- 05/27/2017). Unfortunately, he had a quick treatment failure on PET scan on 08/13/2017. He was therefore started on second-line therapy with Topotecan beginning on 08/24/2017. Postive response to therapy noted on 02/2018 scans.  Patients with extensive stage disease, the median survival is 8 to 13 months, and the five-year survival rate is 1 to 2 percent.  In most series, less than 5 percent of those with Extensive Stage SCLC survive beyond two years.  Prophylactic cranial irradiation decreases the incidence of symptomatic brain metastases in patients who have responded to systemic chemotherapy, although its impact on overall survival is uncertain. For patients with a response to systemic chemotherapy, thoracic radiation may be of benefit in increasing the percentage of long-term survivors.    Labs today: CBC diff, CMET, LDH.  I personally reviewed and went over laboratory results with the patient.  The results are noted within this dictation.  Blood counts today demonstrate a white blood cell count of 6.6, hemoglobin 10.1 g/dL with an elevated MCV at 101.5, and platelet count within normal limits at 352,000.  Metabolic panel is unimpressive and LDH is within normal limits.  ANC is 4.2 today.  Labs satisfy treatment parameters.  Today is D1C11.  Nursing, in accordance with chemotherapy administration protocol, will monitor for acute side effects/toxicities associated with chemotherapy administration today.  He is due for restaging scans in 05/2018.  Therefore, orders are placed for  CT CAP in 3-3.5 weeks for response evaluation.  Return in 4 weeks for follow-up.  He has not had brain imaging since 2018.  I think that if his CT scans are stable or improved in 3 weeks, we must consider restaging his brain.

## 2018-05-03 ENCOUNTER — Inpatient Hospital Stay (HOSPITAL_COMMUNITY): Payer: Medicare PPO

## 2018-05-03 ENCOUNTER — Inpatient Hospital Stay (HOSPITAL_COMMUNITY): Payer: Medicare PPO | Attending: Hematology

## 2018-05-03 ENCOUNTER — Other Ambulatory Visit: Payer: Self-pay

## 2018-05-03 ENCOUNTER — Inpatient Hospital Stay (HOSPITAL_BASED_OUTPATIENT_CLINIC_OR_DEPARTMENT_OTHER): Payer: Medicare PPO | Admitting: Oncology

## 2018-05-03 ENCOUNTER — Encounter (HOSPITAL_COMMUNITY): Payer: Self-pay

## 2018-05-03 ENCOUNTER — Encounter (HOSPITAL_COMMUNITY): Payer: Self-pay | Admitting: Oncology

## 2018-05-03 VITALS — BP 112/57 | HR 63 | Temp 97.6°F | Resp 18 | Wt 148.2 lb

## 2018-05-03 DIAGNOSIS — J438 Other emphysema: Secondary | ICD-10-CM

## 2018-05-03 DIAGNOSIS — C3490 Malignant neoplasm of unspecified part of unspecified bronchus or lung: Secondary | ICD-10-CM | POA: Diagnosis not present

## 2018-05-03 DIAGNOSIS — G893 Neoplasm related pain (acute) (chronic): Secondary | ICD-10-CM

## 2018-05-03 DIAGNOSIS — Z5111 Encounter for antineoplastic chemotherapy: Secondary | ICD-10-CM

## 2018-05-03 DIAGNOSIS — C770 Secondary and unspecified malignant neoplasm of lymph nodes of head, face and neck: Secondary | ICD-10-CM | POA: Diagnosis not present

## 2018-05-03 DIAGNOSIS — F17210 Nicotine dependence, cigarettes, uncomplicated: Secondary | ICD-10-CM | POA: Diagnosis not present

## 2018-05-03 DIAGNOSIS — F17200 Nicotine dependence, unspecified, uncomplicated: Secondary | ICD-10-CM

## 2018-05-03 DIAGNOSIS — C787 Secondary malignant neoplasm of liver and intrahepatic bile duct: Secondary | ICD-10-CM | POA: Insufficient documentation

## 2018-05-03 LAB — CBC WITH DIFFERENTIAL/PLATELET
Abs Immature Granulocytes: 0.02 10*3/uL (ref 0.00–0.07)
Basophils Absolute: 0 10*3/uL (ref 0.0–0.1)
Basophils Relative: 1 %
Eosinophils Absolute: 0.1 10*3/uL (ref 0.0–0.5)
Eosinophils Relative: 1 %
HCT: 32.9 % — ABNORMAL LOW (ref 39.0–52.0)
Hemoglobin: 10.1 g/dL — ABNORMAL LOW (ref 13.0–17.0)
Immature Granulocytes: 0 %
Lymphocytes Relative: 22 %
Lymphs Abs: 1.4 10*3/uL (ref 0.7–4.0)
MCH: 31.2 pg (ref 26.0–34.0)
MCHC: 30.7 g/dL (ref 30.0–36.0)
MCV: 101.5 fL — ABNORMAL HIGH (ref 80.0–100.0)
Monocytes Absolute: 0.8 10*3/uL (ref 0.1–1.0)
Monocytes Relative: 13 %
Neutro Abs: 4.2 10*3/uL (ref 1.7–7.7)
Neutrophils Relative %: 63 %
Platelets: 352 10*3/uL (ref 150–400)
RBC: 3.24 MIL/uL — ABNORMAL LOW (ref 4.22–5.81)
RDW: 22.1 % — ABNORMAL HIGH (ref 11.5–15.5)
WBC: 6.6 10*3/uL (ref 4.0–10.5)
nRBC: 0 % (ref 0.0–0.2)

## 2018-05-03 LAB — COMPREHENSIVE METABOLIC PANEL
ALT: 9 U/L (ref 0–44)
AST: 13 U/L — ABNORMAL LOW (ref 15–41)
Albumin: 4 g/dL (ref 3.5–5.0)
Alkaline Phosphatase: 52 U/L (ref 38–126)
Anion gap: 7 (ref 5–15)
BUN: 19 mg/dL (ref 8–23)
CO2: 24 mmol/L (ref 22–32)
Calcium: 9.2 mg/dL (ref 8.9–10.3)
Chloride: 108 mmol/L (ref 98–111)
Creatinine, Ser: 0.83 mg/dL (ref 0.61–1.24)
GFR calc Af Amer: >60 mL/min (ref >60)
GFR calc non Af Amer: >60 mL/min (ref >60)
Glucose, Bld: 122 mg/dL — ABNORMAL HIGH (ref 70–99)
Potassium: 4.2 mmol/L (ref 3.5–5.1)
Sodium: 139 mmol/L (ref 135–145)
Total Bilirubin: 0.3 mg/dL (ref 0.3–1.2)
Total Protein: 6.9 g/dL (ref 6.5–8.1)

## 2018-05-03 LAB — LACTATE DEHYDROGENASE: LDH: 127 U/L (ref 98–192)

## 2018-05-03 MED ORDER — TOPOTECAN HCL CHEMO INJECTION 4 MG
1.2000 mg/m2 | Freq: Once | INTRAVENOUS | Status: AC
  Administered 2018-05-03: 2.2 mg via INTRAVENOUS
  Filled 2018-05-03: qty 2.2

## 2018-05-03 MED ORDER — OCTREOTIDE ACETATE 30 MG IM KIT
PACK | INTRAMUSCULAR | Status: AC
  Filled 2018-05-03: qty 1

## 2018-05-03 MED ORDER — SODIUM CHLORIDE 0.9 % IV SOLN
Freq: Once | INTRAVENOUS | Status: AC
  Administered 2018-05-03: 09:00:00 via INTRAVENOUS
  Filled 2018-05-03: qty 2

## 2018-05-03 MED ORDER — SODIUM CHLORIDE 0.9% FLUSH: 3.0000 mL | INTRAVENOUS | Status: DC | PRN

## 2018-05-03 MED ORDER — HEPARIN SOD (PORK) LOCK FLUSH 100 UNIT/ML IV SOLN: 500.0000 [IU] | Freq: Once | INTRAVENOUS | Status: DC | PRN

## 2018-05-03 MED ORDER — HYDROCODONE-ACETAMINOPHEN 5-325 MG PO TABS: 1.0000 | ORAL_TABLET | Freq: Four times a day (QID) | ORAL | 0 refills | Status: DC | PRN

## 2018-05-03 MED ORDER — SODIUM CHLORIDE 0.9 % IV SOLN
Freq: Once | INTRAVENOUS | Status: AC
  Administered 2018-05-03: 09:00:00 via INTRAVENOUS

## 2018-05-03 MED ORDER — SODIUM CHLORIDE 0.9% FLUSH: 10.0000 mL | INTRAVENOUS | Status: DC | PRN

## 2018-05-03 NOTE — Progress Notes (Signed)
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 Staging PET scan on 02/02/2018 demonstrated hypermetabolic right cervical, bilateral supraclavicular, mediastinal, right hilar, and right internal mammary lymphadenopathy with hypermetabolic right liver lesion. Baseline MRI brain on 02/11/2017 was negative for intracranial disease. He is S/P Cisplatin/Etoposide x 6 cycles (02/09/2017- 05/27/2017). Unfortunately, he had a quick treatment failure on PET scan on 08/13/2017. He was therefore started on second-line therapy with Topotecan beginning on 08/24/2017. Postive response to therapy noted on 02/2018 scans.  CURRENT THERAPY: Topotecan days 1 through 5 every 28 days, cycle #11 today (05/03/2018)  CURRENT STATUS: [name] 67 y.o. male returns for followup of stage IV small cell lung cancer, currently on second line therapy with Topotecan.  He is tolerating therapy really well.  He denies any new hemoptysis or progressive cough.  He denies any new sudden onset of shortness of breath.  He reports a poor appetite but he continues to eat.  He rates his appetite at 50% and energy level of 75%.  He does request a refill on his pain medication and he notes that it is mild and benefits, but he refuses anything stronger.  Review of Systems  Constitutional: Negative.  Negative for chills, fever and weight loss.  HENT: Negative.   Eyes: Negative.   Respiratory: Negative.  Negative for cough.   Cardiovascular: Negative.  Negative for chest pain.  Gastrointestinal: Negative.  Negative for blood in stool, constipation, diarrhea, melena, nausea and vomiting.    Genitourinary: Negative.   Musculoskeletal: Negative.   Skin: Negative.   Neurological: Positive for dizziness. Negative for weakness.  Endo/Heme/Allergies: Bruises/bleeds easily.  Psychiatric/Behavioral: Negative.     Past Medical History:  Diagnosis Date  . Anxiety   . CAD (coronary artery disease)   . Cancer ([place])    stage 4 small cell lung cancer   . COPD (chronic obstructive pulmonary disease) ([place])   . Depression   . Dyspnea    increased exertion  . Feeling of chest tightness   . Heart palpitations   . History of chemotherapy   . Myocardial infarction ([place])   . Osteopenia   . Panic attacks   . Smoker     Past Surgical History:  Procedure Laterality Date  . BACK SURGERY  12/24/2000   L5,S1  . CORONARY STENT PLACEMENT  2005   RCA & CX  . HERNIA REPAIR Right 1980's  . INGUINAL HERNIA REPAIR  12/1978   right side  . IR FLUORO GUIDE PORT INSERTION RIGHT  04/02/2017  . IR US GUIDE BX ASP/DRAIN  02/03/2017  . IR US GUIDE VASC ACCESS RIGHT  04/02/2017  . NM MYOCAR PERF WALL MOTION  09/07/2009   No ischemia; EF 51%  . SHOULDER SURGERY Left 08/2010  . SPINE SURGERY  2002   L5-S1    Family History  Problem Relation Age of Onset  . Heart attack Father   . Kidney disease Father        renal failure  . Heart failure Mother   . Heart attack Mother   . Cancer Brother   . Diabetes Brother   .  Alcohol abuse Brother   . Diabetes Sister     Social History   Socioeconomic History  . Marital status: Single    Spouse name: Not on file  . Number of children: Not on file  . Years of education: Not on file  . Highest education level: Not on file  Occupational History  . Not on file  Social Needs  . Financial resource strain: Not on file  . Food insecurity:    Worry: Not on file    Inability: Not on file  . Transportation needs:    Medical: Not on file    Non-medical: Not on file  Tobacco Use  . Smoking status: Current Every Day Smoker    Packs/day: 1.00     Types: Cigarettes  . Smokeless tobacco: Never Used  Substance and Sexual Activity  . Alcohol use: Yes    Comment: occas  . Drug use: No  . Sexual activity: Not on file  Lifestyle  . Physical activity:    Days per week: Not on file    Minutes per session: Not on file  . Stress: Not on file  Relationships  . Social connections:    Talks on phone: Not on file    Gets together: Not on file    Attends religious service: Not on file    Active member of club or organization: Not on file    Attends meetings of clubs or organizations: Not on file    Relationship status: Not on file  Other Topics Concern  . Not on file  Social History Narrative  . Not on file     PHYSICAL EXAMINATION  ECOG PERFORMANCE STATUS: 1 - Symptomatic but completely ambulatory  Blood pressure 119/51, pulse 70, respirations 18, temperature 97.6 F, iron saturation 99% on room air.  GENERAL:alert, no distress, cachectic, comfortable, cooperative, ill looking and smiling SKIN: skin color, texture, turgor are normal, no rashes or significant lesions HEAD: Normocephalic, No masses, lesions, tenderness or abnormalities EYES: normal, EOMI, Conjunctiva are pink and non-injected EARS: External ears normal OROPHARYNX:lips, buccal mucosa, and tongue normal  NECK: supple, no adenopathy, trachea midline LYMPH:  no palpable lymphadenopathy BREAST:not examined LUNGS: decreased breath sounds HEART: regular rate & rhythm, no murmurs and no gallops ABDOMEN:abdomen soft, non-tender and normal bowel sounds BACK: Back symmetric, no curvature. EXTREMITIES:less then 2 second capillary refill, no joint deformities, effusion, or inflammation, no skin discoloration, no cyanosis.  Clubbing of fingernails are noted on examination today. NEURO: alert & oriented x 3 with fluent speech, no focal motor/sensory deficits, gait normal   LABORATORY DATA: CBC    Component Value Date/Time   WBC 6.6 05/03/2018 0803   RBC 3.24 (L)  05/03/2018 0803   HGB 10.1 (L) 05/03/2018 0803   HCT 32.9 (L) 05/03/2018 0803   PLT 352 05/03/2018 0803   MCV 101.5 (H) 05/03/2018 0803   MCH 31.2 05/03/2018 0803   MCHC 30.7 05/03/2018 0803   RDW 22.1 (H) 05/03/2018 0803   LYMPHSABS 1.4 05/03/2018 0803   MONOABS 0.8 05/03/2018 0803   EOSABS 0.1 05/03/2018 0803   BASOSABS 0.0 05/03/2018 0803      Chemistry      Component Value Date/Time   NA 139 05/03/2018 0803   K 4.2 05/03/2018 0803   CL 108 05/03/2018 0803   CO2 24 05/03/2018 0803   BUN 19 05/03/2018 0803   CREATININE 0.83 05/03/2018 0803   CREATININE 0.96 01/13/2017 1546      Component Value Date/Time  CALCIUM 9.2 05/03/2018 0803   ALKPHOS 52 05/03/2018 0803   AST 13 (L) 05/03/2018 0803   ALT 9 05/03/2018 0803   BILITOT 0.3 05/03/2018 0803       RADIOGRAPHIC STUDIES:  No results found.   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 Staging PET scan on 02/02/2018 demonstrated hypermetabolic right cervical, bilateral supraclavicular, mediastinal, right hilar, and right internal mammary lymphadenopathy with hypermetabolic right liver lesion. Baseline MRI brain on 02/11/2017 was negative for intracranial disease. He is S/P Cisplatin/Etoposide x 6 cycles (02/09/2017- 05/27/2017). Unfortunately, he had a quick treatment failure on PET scan on 08/13/2017. He was therefore started on second-line therapy with Topotecan beginning on 08/24/2017. Postive response to therapy noted on 02/2018 scans.  Patients with extensive stage disease, the median survival is 8 to 13 months, and the five-year survival rate is 1 to 2 percent.  In most series, less than 5 percent of those with Extensive Stage SCLC survive beyond two years.  Prophylactic cranial irradiation decreases the incidence of symptomatic brain metastases in patients who have  responded to systemic chemotherapy, although its impact on overall survival is uncertain. For patients with a response to systemic chemotherapy, thoracic radiation may be of benefit in increasing the percentage of long-term survivors.    Labs today: CBC diff, CMET, LDH.  I personally reviewed and went over laboratory results with the patient.  The results are noted within this dictation.  Blood counts today demonstrate a white blood cell count of 6.6, hemoglobin 10.1 g/dL with an elevated MCV at 101.5, and platelet count within normal limits at 352,000.  Metabolic panel is unimpressive and LDH is within normal limits.  ANC is 4.2 today.  Labs satisfy treatment parameters.  Today is D1C11.  Nursing, in accordance with chemotherapy administration protocol, will monitor for acute side effects/toxicities associated with chemotherapy administration today.  He is due for restaging scans in 05/2018.  Therefore, orders are placed for CT CAP in 3-3.5 weeks for response evaluation.  Return in 4 weeks for follow-up.  He has not had brain imaging since 2018.  I think that if his CT scans are stable or improved in 3 weeks, we must consider restaging his brain.   2. Encounter for antineoplastic chemotherapy Tolerating therapy well.  Blood counts are adequate for treatment today.  Today is cycle #11  3. Other emphysema ([place]) Secondary to tobacco abuse.  Decreased breath sounds bilaterally on auscultation.  4. Smoker Current smoker.  Smoking cessation education provided today.   ORDERS PLACED FOR THIS ENCOUNTER: Orders Placed This Encounter  Procedures  . CT Abdomen Pelvis W Contrast  . CT Chest W Contrast    MEDICATIONS PRESCRIBED THIS ENCOUNTER: No orders of the defined types were placed in this encounter.   THERAPY PLAN: Topotecan days 1 through 5 every 28 days.  All questions were answered. The patient knows to call the clinic with any problems, questions or concerns. We can certainly see  the patient much sooner if necessary.  Patient and plan discussed with Dr. Derek Jack and she is in agreement with the aforementioned.   This note is electronically signed by: Robynn Pane, PA-C 05/03/2018 9:23 AM

## 2018-05-03 NOTE — Progress Notes (Signed)
Pt presents for treatment today ambulatory with no complaints. Topotecan Day 1. Labs drawn this am. VSS. Pt request Norco 5/325mg  refill. Pt states, " I fell after helping family move due to looking up at the sky and I got dizzy."   PA at the bedside. Labs reviewed. Assessment completed. VO to proceed with treatment.   Treatment given today per MD orders. Tolerated infusion without adverse affects. Vital signs stable. No complaints at this time. Discharged from clinic ambulatory. F/U with [facility] as scheduled.

## 2018-05-03 NOTE — Patient Instructions (Addendum)
[place] at [facility] Discharge Instructions  Treatment today as planned. You are due for restaging scans.  We will get this set up for you in approximately 3 weeks. Refill of your pain medication is sent to your pharmacy, [facility]. Return in 4 weeks for follow-up with ongoing treatment and laboratory work.   Thank you for choosing [place] at [facility] to provide your oncology and hematology care.  To afford each patient quality time with our provider, please arrive at least 15 minutes before your scheduled appointment time.   If you have a lab appointment with the [facility] please come in thru the  Main Entrance and check in at the main information desk  You need to re-schedule your appointment should you arrive 10 or more minutes late.  We strive to give you quality time with our providers, and arriving late affects you and other patients whose appointments are after yours.  Also, if you no show three or more times for appointments you may be dismissed from the clinic at the providers discretion.     Again, thank you for choosing [facility].  Our hope is that these requests will decrease the amount of time that you wait before being seen by our physicians.       _____________________________________________________________  Should you have questions after your visit to [facility], please contact our office at [phone] between the hours of 8:00 a.m. and 4:30 p.m.  Voicemails left after 4:00 p.m. will not be returned until the following business day.  For prescription refill requests, have your pharmacy contact our office and allow 72 hours.    Cancer Center Support Programs:   > Cancer Support Group  2nd Tuesday of the month 1pm-2pm, Journey Room

## 2018-05-03 NOTE — Addendum Note (Signed)
Addended by: Baird [facility] on: 05/03/2018 09:32 AM   Modules accepted: Orders

## 2018-05-03 NOTE — Patient Instructions (Signed)
[facility] Discharge Instructions for Patients Receiving Chemotherapy  Today you received the following chemotherapy agents   To help prevent nausea and vomiting after your treatment, we encourage you to take your nausea medication   If you develop nausea and vomiting that is not controlled by your nausea medication, call the clinic.   BELOW ARE SYMPTOMS THAT SHOULD BE REPORTED IMMEDIATELY:  *FEVER GREATER THAN 100.5 F  *CHILLS WITH OR WITHOUT FEVER  NAUSEA AND VOMITING THAT IS NOT CONTROLLED WITH YOUR NAUSEA MEDICATION  *UNUSUAL SHORTNESS OF BREATH  *UNUSUAL BRUISING OR BLEEDING  TENDERNESS IN MOUTH AND THROAT WITH OR WITHOUT PRESENCE OF ULCERS  *URINARY PROBLEMS  *BOWEL PROBLEMS  UNUSUAL RASH Items with * indicate a potential emergency and should be followed up as soon as possible.  Feel free to call the clinic should you have any questions or concerns. The clinic phone number is [phone].  Please show the CHEMO ALERT CARD at check-in to the Emergency Department and triage nurse.   

## 2018-05-04 ENCOUNTER — Inpatient Hospital Stay (HOSPITAL_COMMUNITY): Payer: Medicare PPO

## 2018-05-04 ENCOUNTER — Encounter (HOSPITAL_COMMUNITY): Payer: Self-pay

## 2018-05-04 VITALS — BP 119/54 | HR 69 | Temp 97.5°F | Resp 18

## 2018-05-04 DIAGNOSIS — C3490 Malignant neoplasm of unspecified part of unspecified bronchus or lung: Secondary | ICD-10-CM | POA: Diagnosis not present

## 2018-05-04 DIAGNOSIS — C787 Secondary malignant neoplasm of liver and intrahepatic bile duct: Secondary | ICD-10-CM | POA: Diagnosis not present

## 2018-05-04 DIAGNOSIS — C770 Secondary and unspecified malignant neoplasm of lymph nodes of head, face and neck: Secondary | ICD-10-CM | POA: Diagnosis not present

## 2018-05-04 DIAGNOSIS — Z5111 Encounter for antineoplastic chemotherapy: Secondary | ICD-10-CM | POA: Diagnosis not present

## 2018-05-04 MED ORDER — SODIUM CHLORIDE 0.9% FLUSH
10.0000 mL | INTRAVENOUS | Status: DC | PRN
  Administered 2018-05-04: 10 mL
  Filled 2018-05-04: qty 10

## 2018-05-04 MED ORDER — SODIUM CHLORIDE 0.9 % IV SOLN
Freq: Once | INTRAVENOUS | Status: AC
  Administered 2018-05-04: 13:00:00 via INTRAVENOUS

## 2018-05-04 MED ORDER — TOPOTECAN HCL CHEMO INJECTION 4 MG
1.2000 mg/m2 | Freq: Once | INTRAVENOUS | Status: AC
  Administered 2018-05-04: 2.2 mg via INTRAVENOUS
  Filled 2018-05-04: qty 2.2

## 2018-05-04 MED ORDER — SODIUM CHLORIDE 0.9 % IV SOLN
Freq: Once | INTRAVENOUS | Status: AC
  Administered 2018-05-04: 14:00:00 via INTRAVENOUS
  Filled 2018-05-04: qty 2

## 2018-05-04 NOTE — Progress Notes (Signed)
[name] tolerated Topotecan infusion well without complaints or incident. VSS upon discharge. Port left accessed for use tomorrow. Pt discharged self ambulatory in satisfactory condition

## 2018-05-04 NOTE — Patient Instructions (Signed)
[facility] Discharge Instructions for Patients Receiving Chemotherapy   Beginning January 23rd 2017 lab work for the [facility] will be done in the  Main lab at [facility] on 1st floor. If you have a lab appointment with the [facility] please come in thru the  Main Entrance and check in at the main information desk   Today you received the following chemotherapy agents Topotecan. Follow-up as scheduled. Call clinic for any questions or concerns  To help prevent nausea and vomiting after your treatment, we encourage you to take your nausea medication    If you develop nausea and vomiting, or diarrhea that is not controlled by your medication, call the clinic.  The clinic phone number is [phone]. Office hours are Monday-Friday 8:30am-5:00pm.  BELOW ARE SYMPTOMS THAT SHOULD BE REPORTED IMMEDIATELY:  *FEVER GREATER THAN 101.0 F  *CHILLS WITH OR WITHOUT FEVER  NAUSEA AND VOMITING THAT IS NOT CONTROLLED WITH YOUR NAUSEA MEDICATION  *UNUSUAL SHORTNESS OF BREATH  *UNUSUAL BRUISING OR BLEEDING  TENDERNESS IN MOUTH AND THROAT WITH OR WITHOUT PRESENCE OF ULCERS  *URINARY PROBLEMS  *BOWEL PROBLEMS  UNUSUAL RASH Items with * indicate a potential emergency and should be followed up as soon as possible. If you have an emergency after office hours please contact your primary care physician or go to the nearest emergency department.  Please call the clinic during office hours if you have any questions or concerns.   You may also contact the Patient Navigator at [phone] should you have any questions or need assistance in obtaining follow up care.      Resources For Cancer Patients and their Caregivers ? American Cancer Society: Can assist with transportation, wigs, general needs, runs Look Good Feel Better.        475-852-9443 ? Cancer Care: Provides financial assistance, online support groups, medication/co-pay assistance.  1-800-813-HOPE  (854) 538-9608) ? [facility] Assists [place] cancer patients and their families through emotional , educational and financial support.  (419)257-9585 ? [place] DSS Where to apply for food stamps, Medicaid and utility assistance. (901)411-3147 ? RCATS: Transportation to medical appointments. (540)594-2107 ? Social Security Administration: May apply for disability if have a Stage IV cancer. (603)761-7638 (646)618-6798 ? [employer], Disability and Transit Services: Assists with nutrition, care and transit needs. [phone]

## 2018-05-05 ENCOUNTER — Other Ambulatory Visit (HOSPITAL_COMMUNITY): Payer: Self-pay | Admitting: Hematology

## 2018-05-05 ENCOUNTER — Inpatient Hospital Stay (HOSPITAL_COMMUNITY): Payer: Medicare PPO

## 2018-05-05 ENCOUNTER — Encounter (HOSPITAL_COMMUNITY): Payer: Self-pay

## 2018-05-05 VITALS — BP 118/58 | HR 65 | Temp 97.7°F | Resp 18

## 2018-05-05 DIAGNOSIS — C3490 Malignant neoplasm of unspecified part of unspecified bronchus or lung: Secondary | ICD-10-CM | POA: Diagnosis not present

## 2018-05-05 DIAGNOSIS — C787 Secondary malignant neoplasm of liver and intrahepatic bile duct: Secondary | ICD-10-CM | POA: Diagnosis not present

## 2018-05-05 DIAGNOSIS — C770 Secondary and unspecified malignant neoplasm of lymph nodes of head, face and neck: Secondary | ICD-10-CM | POA: Diagnosis not present

## 2018-05-05 DIAGNOSIS — T451X5A Adverse effect of antineoplastic and immunosuppressive drugs, initial encounter: Principal | ICD-10-CM

## 2018-05-05 DIAGNOSIS — G620 Drug-induced polyneuropathy: Secondary | ICD-10-CM

## 2018-05-05 DIAGNOSIS — Z5111 Encounter for antineoplastic chemotherapy: Secondary | ICD-10-CM | POA: Diagnosis not present

## 2018-05-05 MED ORDER — SODIUM CHLORIDE 0.9% FLUSH
10.0000 mL | INTRAVENOUS | Status: DC | PRN
  Administered 2018-05-05: 10 mL
  Filled 2018-05-05: qty 10

## 2018-05-05 MED ORDER — TOPOTECAN HCL CHEMO INJECTION 4 MG
1.2000 mg/m2 | Freq: Once | INTRAVENOUS | Status: AC
  Administered 2018-05-05: 2.2 mg via INTRAVENOUS
  Filled 2018-05-05: qty 2.2

## 2018-05-05 MED ORDER — SODIUM CHLORIDE 0.9 % IV SOLN
Freq: Once | INTRAVENOUS | Status: AC
  Administered 2018-05-05: 13:00:00 via INTRAVENOUS

## 2018-05-05 MED ORDER — SODIUM CHLORIDE 0.9 % IV SOLN
Freq: Once | INTRAVENOUS | Status: AC
  Administered 2018-05-05: 14:00:00 via INTRAVENOUS
  Filled 2018-05-05: qty 2

## 2018-05-05 NOTE — Progress Notes (Signed)
Patient tolerated treatment with no complaints voiced.  Port site clean and dry with no bruising or swelling noted.  Good blood return noted before and after therapy.  Dressing intact.  VSS with discharge and left ambulatory with no s/s of distress noted.

## 2018-05-05 NOTE — Patient Instructions (Signed)
[facility] Discharge Instructions for Patients Receiving Chemotherapy  Today you received the following chemotherapy agents topetecan.   If you develop nausea and vomiting that is not controlled by your nausea medication, call the clinic.   BELOW ARE SYMPTOMS THAT SHOULD BE REPORTED IMMEDIATELY:  *FEVER GREATER THAN 100.5 F  *CHILLS WITH OR WITHOUT FEVER  NAUSEA AND VOMITING THAT IS NOT CONTROLLED WITH YOUR NAUSEA MEDICATION  *UNUSUAL SHORTNESS OF BREATH  *UNUSUAL BRUISING OR BLEEDING  TENDERNESS IN MOUTH AND THROAT WITH OR WITHOUT PRESENCE OF ULCERS  *URINARY PROBLEMS  *BOWEL PROBLEMS  UNUSUAL RASH Items with * indicate a potential emergency and should be followed up as soon as possible.  Feel free to call the clinic should you have any questions or concerns. The clinic phone number is [phone].  Please show the [place] at check-in to the Emergency Department and triage nurse.

## 2018-05-06 ENCOUNTER — Inpatient Hospital Stay (HOSPITAL_COMMUNITY): Payer: Medicare PPO

## 2018-05-06 VITALS — BP 119/54 | HR 68 | Temp 98.0°F | Resp 18 | Wt 150.4 lb

## 2018-05-06 DIAGNOSIS — C770 Secondary and unspecified malignant neoplasm of lymph nodes of head, face and neck: Secondary | ICD-10-CM | POA: Diagnosis not present

## 2018-05-06 DIAGNOSIS — C3490 Malignant neoplasm of unspecified part of unspecified bronchus or lung: Secondary | ICD-10-CM | POA: Diagnosis not present

## 2018-05-06 DIAGNOSIS — C787 Secondary malignant neoplasm of liver and intrahepatic bile duct: Secondary | ICD-10-CM | POA: Diagnosis not present

## 2018-05-06 DIAGNOSIS — Z5111 Encounter for antineoplastic chemotherapy: Secondary | ICD-10-CM | POA: Diagnosis not present

## 2018-05-06 MED ORDER — SODIUM CHLORIDE 0.9% FLUSH
10.0000 mL | INTRAVENOUS | Status: DC | PRN
  Administered 2018-05-06: 10 mL
  Filled 2018-05-06: qty 10

## 2018-05-06 MED ORDER — SODIUM CHLORIDE 0.9 % IV SOLN
Freq: Once | INTRAVENOUS | Status: AC
  Administered 2018-05-06: 13:00:00 via INTRAVENOUS

## 2018-05-06 MED ORDER — SODIUM CHLORIDE 0.9 % IV SOLN
Freq: Once | INTRAVENOUS | Status: AC
  Administered 2018-05-06: 14:00:00 via INTRAVENOUS
  Filled 2018-05-06: qty 2

## 2018-05-06 MED ORDER — TOPOTECAN HCL CHEMO INJECTION 4 MG
1.2000 mg/m2 | Freq: Once | INTRAVENOUS | Status: AC
  Administered 2018-05-06: 2.2 mg via INTRAVENOUS
  Filled 2018-05-06: qty 2.2

## 2018-05-06 NOTE — Progress Notes (Signed)
[name] tolerated Hycamtin infusion without incident or complaint. VSS upon completion of treatment. Port flushed and secured, left accessed for use tomorrow. Discharged self ambulatory in satisfactory condition.

## 2018-05-06 NOTE — Patient Instructions (Signed)
[facility] Discharge Instructions for Patients Receiving Chemotherapy   Beginning January 23rd 2017 lab work for the [facility] will be done in the  Main lab at [facility] on 1st floor. If you have a lab appointment with the [facility] please come in thru the  Main Entrance and check in at the main information desk   Today you received the following chemotherapy agents Hycamtin  To help prevent nausea and vomiting after your treatment, we encourage you to take your nausea medication   If you develop nausea and vomiting, or diarrhea that is not controlled by your medication, call the clinic.  The clinic phone number is [phone]. Office hours are Monday-Friday 8:30am-5:00pm.  BELOW ARE SYMPTOMS THAT SHOULD BE REPORTED IMMEDIATELY:  *FEVER GREATER THAN 101.0 F  *CHILLS WITH OR WITHOUT FEVER  NAUSEA AND VOMITING THAT IS NOT CONTROLLED WITH YOUR NAUSEA MEDICATION  *UNUSUAL SHORTNESS OF BREATH  *UNUSUAL BRUISING OR BLEEDING  TENDERNESS IN MOUTH AND THROAT WITH OR WITHOUT PRESENCE OF ULCERS  *URINARY PROBLEMS  *BOWEL PROBLEMS  UNUSUAL RASH Items with * indicate a potential emergency and should be followed up as soon as possible. If you have an emergency after office hours please contact your primary care physician or go to the nearest emergency department.  Please call the clinic during office hours if you have any questions or concerns.   You may also contact the Patient Navigator at [phone] should you have any questions or need assistance in obtaining follow up care.      Resources For Cancer Patients and their Caregivers ? American Cancer Society: Can assist with transportation, wigs, general needs, runs Look Good Feel Better.        240-630-9599 ? Cancer Care: Provides financial assistance, online support groups, medication/co-pay assistance.  1-800-813-HOPE 386-560-9234) ? [facility] Assists [place] cancer  patients and their families through emotional , educational and financial support.  904-096-9541 ? [place] DSS Where to apply for food stamps, Medicaid and utility assistance. 706-195-1067 ? [facility]: Transportation to medical appointments. 437-508-6219 ? Social Security Administration: May apply for disability if have a Stage IV cancer. 226 802 8210 828-750-3102 ? [employer], Disability and Transit Services: Assists with nutrition, care and transit needs. [phone]

## 2018-05-07 ENCOUNTER — Encounter (HOSPITAL_COMMUNITY): Payer: Self-pay

## 2018-05-07 ENCOUNTER — Ambulatory Visit (HOSPITAL_COMMUNITY): Payer: Medicare PPO

## 2018-05-07 ENCOUNTER — Inpatient Hospital Stay (HOSPITAL_COMMUNITY): Payer: Medicare PPO

## 2018-05-07 ENCOUNTER — Encounter (HOSPITAL_COMMUNITY): Payer: Medicare PPO

## 2018-05-07 VITALS — BP 142/66 | HR 60 | Temp 97.5°F | Resp 18 | Wt 150.4 lb

## 2018-05-07 DIAGNOSIS — C787 Secondary malignant neoplasm of liver and intrahepatic bile duct: Secondary | ICD-10-CM | POA: Diagnosis not present

## 2018-05-07 DIAGNOSIS — C3490 Malignant neoplasm of unspecified part of unspecified bronchus or lung: Secondary | ICD-10-CM

## 2018-05-07 DIAGNOSIS — C770 Secondary and unspecified malignant neoplasm of lymph nodes of head, face and neck: Secondary | ICD-10-CM | POA: Diagnosis not present

## 2018-05-07 DIAGNOSIS — Z5111 Encounter for antineoplastic chemotherapy: Secondary | ICD-10-CM | POA: Diagnosis not present

## 2018-05-07 MED ORDER — SODIUM CHLORIDE 0.9 % IV SOLN
Freq: Once | INTRAVENOUS | Status: AC
  Administered 2018-05-07: 10:00:00 via INTRAVENOUS

## 2018-05-07 MED ORDER — PEGFILGRASTIM 6 MG/0.6ML ~~LOC~~ PSKT
6.0000 mg | PREFILLED_SYRINGE | Freq: Once | SUBCUTANEOUS | Status: AC
  Administered 2018-05-07: 6 mg via SUBCUTANEOUS

## 2018-05-07 MED ORDER — SODIUM CHLORIDE 0.9 % IV SOLN
Freq: Once | INTRAVENOUS | Status: AC
  Administered 2018-05-07: 11:00:00 via INTRAVENOUS
  Filled 2018-05-07: qty 2

## 2018-05-07 MED ORDER — HEPARIN SOD (PORK) LOCK FLUSH 100 UNIT/ML IV SOLN
500.0000 [IU] | Freq: Once | INTRAVENOUS | Status: AC | PRN
  Administered 2018-05-07: 500 [IU]

## 2018-05-07 MED ORDER — SODIUM CHLORIDE 0.9% FLUSH
10.0000 mL | INTRAVENOUS | Status: DC | PRN
  Administered 2018-05-07: 10 mL
  Filled 2018-05-07: qty 10

## 2018-05-07 MED ORDER — PEGFILGRASTIM 6 MG/0.6ML ~~LOC~~ PSKT
PREFILLED_SYRINGE | SUBCUTANEOUS | Status: AC
  Filled 2018-05-07: qty 0.6

## 2018-05-07 MED ORDER — TOPOTECAN HCL CHEMO INJECTION 4 MG
1.2000 mg/m2 | Freq: Once | INTRAVENOUS | Status: AC
  Administered 2018-05-07: 2.2 mg via INTRAVENOUS
  Filled 2018-05-07: qty 2.2

## 2018-05-07 MED ORDER — HEPARIN SOD (PORK) LOCK FLUSH 100 UNIT/ML IV SOLN
INTRAVENOUS | Status: AC
  Filled 2018-05-07: qty 5

## 2018-05-07 NOTE — Progress Notes (Signed)
Nutrition Follow-up:  Patient with extensive small cell lung cancer.  Patient receiving chemotherapy.   Met with patient today during infusion.  Patient reports appetite is good except for week of treatment.  Reports that he continues to drink homemade smoothies, eating high calorie and high protein foods at meal times.    No nutrition impact symptoms reported  Medications: reviewed  Labs: glucose 122  Anthropometrics:   Weight 150 lb 6.4 oz increased from 147 lb on 9/27   NUTRITION DIAGNOSIS:  Malnutrition improved   MALNUTRITION DIAGNOSIS: severe malnutrition improving   INTERVENTION:  Encouraged high calorie, high protein foods Declined ensure today.     MONITORING, EVALUATION, GOAL: weight trends, intake   NEXT VISIT: November 22 during infusion  Joli B. Zenia Resides, [place], [place] Registered Dietitian [phone] (pager)

## 2018-05-07 NOTE — Patient Instructions (Signed)
[facility] Discharge Instructions for Patients Receiving Chemotherapy  Today you received the following chemotherapy agents hycamtin.    If you develop nausea and vomiting that is not controlled by your nausea medication, call the clinic.   BELOW ARE SYMPTOMS THAT SHOULD BE REPORTED IMMEDIATELY:  *FEVER GREATER THAN 100.5 F  *CHILLS WITH OR WITHOUT FEVER  NAUSEA AND VOMITING THAT IS NOT CONTROLLED WITH YOUR NAUSEA MEDICATION  *UNUSUAL SHORTNESS OF BREATH  *UNUSUAL BRUISING OR BLEEDING  TENDERNESS IN MOUTH AND THROAT WITH OR WITHOUT PRESENCE OF ULCERS  *URINARY PROBLEMS  *BOWEL PROBLEMS  UNUSUAL RASH Items with * indicate a potential emergency and should be followed up as soon as possible.  Feel free to call the clinic should you have any questions or concerns. The clinic phone number is [phone].  Please show the [facility] at check-in to the Emergency Department and triage nurse.

## 2018-05-07 NOTE — Progress Notes (Signed)
Patient tolerated treatment with no complaints voiced.  Good blood return noted before and after administration of chemotherapy. No bruising or swelling noted at site.  Neulasta Onpro to right arm with green indicator light flashing.  Band aid applied to port.  VSs with discharge and left ambulatory with no s/s of distress noted.

## 2018-05-14 ENCOUNTER — Other Ambulatory Visit: Payer: Self-pay | Admitting: Family Medicine

## 2018-05-26 ENCOUNTER — Ambulatory Visit (HOSPITAL_COMMUNITY)
Admission: RE | Admit: 2018-05-26 | Discharge: 2018-05-26 | Disposition: A | Discharge status: Home / Self Care | Payer: Medicare PPO | Source: Ambulatory Visit | Attending: Oncology | Admitting: Oncology

## 2018-05-26 DIAGNOSIS — K5730 Diverticulosis of large intestine without perforation or abscess without bleeding: Secondary | ICD-10-CM | POA: Insufficient documentation

## 2018-05-26 DIAGNOSIS — I2510 Atherosclerotic heart disease of native coronary artery without angina pectoris: Secondary | ICD-10-CM | POA: Diagnosis not present

## 2018-05-26 DIAGNOSIS — N281 Cyst of kidney, acquired: Secondary | ICD-10-CM | POA: Insufficient documentation

## 2018-05-26 DIAGNOSIS — J439 Emphysema, unspecified: Secondary | ICD-10-CM | POA: Insufficient documentation

## 2018-05-26 DIAGNOSIS — C3490 Malignant neoplasm of unspecified part of unspecified bronchus or lung: Secondary | ICD-10-CM | POA: Diagnosis not present

## 2018-05-26 MED ORDER — IOPAMIDOL (ISOVUE-300) INJECTION 61%
100.0000 mL | Freq: Once | INTRAVENOUS | Status: AC | PRN
  Administered 2018-05-26: 100 mL via INTRAVENOUS

## 2018-05-27 ENCOUNTER — Other Ambulatory Visit (HOSPITAL_COMMUNITY): Payer: Self-pay

## 2018-05-27 DIAGNOSIS — Z5111 Encounter for antineoplastic chemotherapy: Secondary | ICD-10-CM

## 2018-05-27 DIAGNOSIS — C3490 Malignant neoplasm of unspecified part of unspecified bronchus or lung: Secondary | ICD-10-CM

## 2018-05-31 ENCOUNTER — Inpatient Hospital Stay (HOSPITAL_COMMUNITY): Payer: Medicare PPO

## 2018-05-31 ENCOUNTER — Inpatient Hospital Stay (HOSPITAL_COMMUNITY): Payer: Medicare PPO | Attending: Hematology

## 2018-05-31 ENCOUNTER — Inpatient Hospital Stay (HOSPITAL_BASED_OUTPATIENT_CLINIC_OR_DEPARTMENT_OTHER): Payer: Medicare PPO | Admitting: Hematology

## 2018-05-31 ENCOUNTER — Encounter (HOSPITAL_COMMUNITY): Payer: Self-pay | Admitting: Hematology

## 2018-05-31 ENCOUNTER — Encounter (HOSPITAL_COMMUNITY): Payer: Self-pay

## 2018-05-31 VITALS — BP 118/56 | HR 57 | Temp 97.5°F | Resp 18 | Wt 150.0 lb

## 2018-05-31 DIAGNOSIS — C770 Secondary and unspecified malignant neoplasm of lymph nodes of head, face and neck: Secondary | ICD-10-CM

## 2018-05-31 DIAGNOSIS — C787 Secondary malignant neoplasm of liver and intrahepatic bile duct: Secondary | ICD-10-CM | POA: Diagnosis not present

## 2018-05-31 DIAGNOSIS — D649 Anemia, unspecified: Secondary | ICD-10-CM | POA: Diagnosis not present

## 2018-05-31 DIAGNOSIS — G893 Neoplasm related pain (acute) (chronic): Secondary | ICD-10-CM

## 2018-05-31 DIAGNOSIS — Z5189 Encounter for other specified aftercare: Secondary | ICD-10-CM | POA: Diagnosis not present

## 2018-05-31 DIAGNOSIS — G620 Drug-induced polyneuropathy: Secondary | ICD-10-CM

## 2018-05-31 DIAGNOSIS — Z5111 Encounter for antineoplastic chemotherapy: Secondary | ICD-10-CM

## 2018-05-31 DIAGNOSIS — T451X5A Adverse effect of antineoplastic and immunosuppressive drugs, initial encounter: Secondary | ICD-10-CM

## 2018-05-31 DIAGNOSIS — K5900 Constipation, unspecified: Secondary | ICD-10-CM

## 2018-05-31 DIAGNOSIS — C3490 Malignant neoplasm of unspecified part of unspecified bronchus or lung: Secondary | ICD-10-CM

## 2018-05-31 DIAGNOSIS — R5383 Other fatigue: Secondary | ICD-10-CM

## 2018-05-31 DIAGNOSIS — R112 Nausea with vomiting, unspecified: Secondary | ICD-10-CM

## 2018-05-31 LAB — CBC WITH DIFFERENTIAL/PLATELET
Abs Immature Granulocytes: 0.04 10*3/uL (ref 0.00–0.07)
Basophils Absolute: 0.1 10*3/uL (ref 0.0–0.1)
Basophils Relative: 1 %
Eosinophils Absolute: 0.1 10*3/uL (ref 0.0–0.5)
Eosinophils Relative: 1 %
HCT: 32.3 % — ABNORMAL LOW (ref 39.0–52.0)
Hemoglobin: 10 g/dL — ABNORMAL LOW (ref 13.0–17.0)
Immature Granulocytes: 1 %
Lymphocytes Relative: 17 %
Lymphs Abs: 1.1 10*3/uL (ref 0.7–4.0)
MCH: 31.4 pg (ref 26.0–34.0)
MCHC: 31 g/dL (ref 30.0–36.0)
MCV: 101.6 fL — ABNORMAL HIGH (ref 80.0–100.0)
Monocytes Absolute: 1 10*3/uL (ref 0.1–1.0)
Monocytes Relative: 14 %
Neutro Abs: 4.5 10*3/uL (ref 1.7–7.7)
Neutrophils Relative %: 66 %
Platelets: 372 10*3/uL (ref 150–400)
RBC: 3.18 MIL/uL — ABNORMAL LOW (ref 4.22–5.81)
RDW: 22 % — ABNORMAL HIGH (ref 11.5–15.5)
WBC: 6.8 10*3/uL (ref 4.0–10.5)
nRBC: 0 % (ref 0.0–0.2)

## 2018-05-31 LAB — COMPREHENSIVE METABOLIC PANEL
ALT: 12 U/L (ref 0–44)
AST: 15 U/L (ref 15–41)
Albumin: 3.9 g/dL (ref 3.5–5.0)
Alkaline Phosphatase: 59 U/L (ref 38–126)
Anion gap: 5 (ref 5–15)
BUN: 18 mg/dL (ref 8–23)
CO2: 25 mmol/L (ref 22–32)
Calcium: 8.6 mg/dL — ABNORMAL LOW (ref 8.9–10.3)
Chloride: 107 mmol/L (ref 98–111)
Creatinine, Ser: 0.89 mg/dL (ref 0.61–1.24)
GFR calc Af Amer: >60 mL/min (ref >60)
GFR calc non Af Amer: >60 mL/min (ref >60)
Glucose, Bld: 84 mg/dL (ref 70–99)
Potassium: 3.9 mmol/L (ref 3.5–5.1)
Sodium: 137 mmol/L (ref 135–145)
Total Bilirubin: 0.5 mg/dL (ref 0.3–1.2)
Total Protein: 7 g/dL (ref 6.5–8.1)

## 2018-05-31 MED ORDER — SODIUM CHLORIDE 0.9 % IV SOLN
Freq: Once | INTRAVENOUS | Status: AC
  Administered 2018-05-31: 10:00:00 via INTRAVENOUS

## 2018-05-31 MED ORDER — SODIUM CHLORIDE 0.9 % IV SOLN
Freq: Once | INTRAVENOUS | Status: AC
  Administered 2018-05-31: 10:00:00 via INTRAVENOUS
  Filled 2018-05-31: qty 2

## 2018-05-31 MED ORDER — TOPOTECAN HCL CHEMO INJECTION 4 MG
1.2000 mg/m2 | Freq: Once | INTRAVENOUS | Status: AC
  Administered 2018-05-31: 2.2 mg via INTRAVENOUS
  Filled 2018-05-31: qty 2.2

## 2018-05-31 MED ORDER — OXYCODONE-ACETAMINOPHEN 5-325 MG PO TABS: 1.0000 | ORAL_TABLET | Freq: Three times a day (TID) | ORAL | 0 refills | Status: DC | PRN

## 2018-05-31 MED ORDER — SODIUM CHLORIDE 0.9% FLUSH
10.0000 mL | INTRAVENOUS | Status: DC | PRN
  Administered 2018-05-31: 10 mL
  Filled 2018-05-31: qty 10

## 2018-05-31 MED ORDER — HEPARIN SOD (PORK) LOCK FLUSH 100 UNIT/ML IV SOLN
INTRAVENOUS | Status: AC
  Filled 2018-05-31: qty 5

## 2018-05-31 MED ORDER — GABAPENTIN 300 MG PO CAPS: 300.0000 mg | ORAL_CAPSULE | Freq: Two times a day (BID) | ORAL | 4 refills | Status: DC

## 2018-05-31 NOTE — Patient Instructions (Signed)
[facility] Discharge Instructions for Patients Receiving Chemotherapy  Today you received the following chemotherapy agents topetecan.    If you develop nausea and vomiting that is not controlled by your nausea medication, call the clinic.   BELOW ARE SYMPTOMS THAT SHOULD BE REPORTED IMMEDIATELY:  *FEVER GREATER THAN 100.5 F  *CHILLS WITH OR WITHOUT FEVER  NAUSEA AND VOMITING THAT IS NOT CONTROLLED WITH YOUR NAUSEA MEDICATION  *UNUSUAL SHORTNESS OF BREATH  *UNUSUAL BRUISING OR BLEEDING  TENDERNESS IN MOUTH AND THROAT WITH OR WITHOUT PRESENCE OF ULCERS  *URINARY PROBLEMS  *BOWEL PROBLEMS  UNUSUAL RASH Items with * indicate a potential emergency and should be followed up as soon as possible.  Feel free to call the clinic should you have any questions or concerns. The clinic phone number is [phone].  Please show the [place] [facility] at check-in to the Emergency Department and triage nurse.

## 2018-05-31 NOTE — Assessment & Plan Note (Addendum)
1.  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 4 weeks, cycle 6 on 12/14/2017. - CT scan on 01/06/2018 after 6 cycles of chemotherapy showed slight improvement in the lung lesions.  It can be considered more or less stable disease.  No new areas were seen. -Cycle 11 was done on 05/03/2018.  We discussed about the CT scan results dated 05/26/2018 showing previously identified right-sided pulmonary nodules are all stable.  No new or progressive findings of malignancy in the chest, abdomen and pelvis. -I have reviewed his blood work.  We will continue with cycle 12 today at the same dose level.  He will come back in 4 weeks for follow-up.  2.  Normocytic anemia: Last Feraheme infusion was in 10/29/2017.  His anemia has improved since we changed his chemotherapy to every 4 weeks.  3.  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  He is taking gabapentin 300 mg twice daily.  Numbness in the hands has improved.  4.  Nausea and vomiting: He is doing better in terms of nausea since we added Zofran to the premedications.  5.  Cancer/chemotherapy related pains: -He has pains in the back and bilateral shoulders.  He has been taking hydrocodone 5 mg 2-3 times a day.  Pain is not well controlled.  He tried taking 2 tablets at one time but did not help. -I will start him on Percocet 5 mg 2-3 times a day and titrated up as  needed.

## 2018-05-31 NOTE — Progress Notes (Signed)
[place], [place] [address]   CLINIC:  Medical Oncology/Hematology  PCP:  [name], [name] [address] [facility] [place] [id number] [phone]   REASON FOR VISIT: Follow-up for extensive stage small cell lung cancer  CURRENT THERAPY: Topotecan every 4 weeks.   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 Bulky 5.4 cm right supraclavicular region malignant lymph node conglomeration with extracapsular extension. 2. Surrounding smaller abnormal right level 3 and level 5 lymph nodes, and the lymphadenopathy continues into the superior mediastinum, see Chest CT findings reported separately. 3. No other metastatic disease identified in the neck.    01/16/2017 Imaging    CT chest: IMPRESSION: 1. Extensive lymphadenopathy in the thorax and lower right cervical region, as discussed above. Primary differential considerations include lymphoma/leukemia or small cell carcinoma of the lung. Further evaluation a PET-CT could be considered to assess for additional sites of disease below the diaphragm if clinically appropriate. Additionally, ultrasound-guided biopsy of supraclavicular lymphadenopathy could be considered to establish a tissue diagnosis. 2. Indeterminate lesion in the periphery of segment 8 of the liver measuring 2.7 x 1.7 cm. Attention at time of follow-up PET-CT is recommended. 3. Aortic atherosclerosis, in addition to left main and 3 vessel coronary artery disease. Please note that although the presence of coronary artery calcium documents the presence of coronary artery disease, the severity of this disease and any potential stenosis cannot be assessed on this non-gated CT examination. Assessment for potential risk factor modification, dietary therapy or pharmacologic therapy may be warranted, if clinically indicated. 4. There are  calcifications of the aortic valve. Echocardiographic correlation for evaluation of potential valvular dysfunction may be warranted if clinically indicated. 5. Diffuse bronchial wall thickening with moderate centrilobular and paraseptal emphysema; imaging findings suggestive of underlying COPD.    02/03/2017 Initial Biopsy    (R) neck lymph node biopsy: SMALL CELL CARCINOMA (most likely lung primary).     02/03/2017 Miscellaneous    Port-a-cath attempted by IR; unable to place d/t enlarged SVC.     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 Staging for metastatic disease  EXAM: MRI HEAD WITHOUT AND WITH CONTRAST  TECHNIQUE: Multiplanar, multiecho pulse sequences of the brain and surrounding structures were obtained without and with intravenous contrast.  CONTRAST:  47m MULTIHANCE GADOBENATE DIMEGLUMINE 529 MG/ML IV SOLN  COMPARISON:  None.  FINDINGS: Brain: Negative for hydrocephalus. Cerebral volume normal for age. Small nonenhancing white matter hyperintensities consistent with mild chronic microvascular ischemia. No acute infarct. Negative for hemorrhage or mass or edema  Normal enhancement postcontrast infusion. No enhancing mass lesion. Leptomeningeal enhancement is normal.  Vascular: Normal arterial flow voids.  Normal venous enhancement  Skull and upper cervical spine: Negative  Sinuses/Orbits: Negative  Other: None  IMPRESSION: Negative for metastatic disease.  No acute abnormality.  Mild chronic white matter changes.    04/07/2017 Imaging     PET:  1. Marked reduction in size and metabolic activity of bulky RIGHT supraclavicular adenopathy mediastinal lymphadenopathy. 2. Residual moderate activity remains within small  RIGHT supraclavicular lymph node, RIGHT lower paratracheal lymph node and RIGHT  hilar lymph node. 3. Resolution of prevascular and internal mammary mediastinal metastatic hypermetabolic activity. 4. Resolution of metabolic activity associated with solitary RIGHT hepatic lobe liver metastasis. 5. No evidence of disease progression. 6. 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    05/27/2017 Imaging    MRI brain w/ and w/o contrast IMPRESSION: 1. No metastatic disease identified. 2. Increased nonspecific cerebral white matter signal changes since August. These are most commonly small vessel disease related. 3. New right maxillary sinusitis. Benign appearing retention cysts in the nasopharynx with trace mastoid effusions.    06/12/2017 Imaging    PET-CT IMPRESSION: 1. There are two new hypermetabolic nodules identified within both lower lobes measuring up to 3.1 cm. The appearance is nonspecific and may be inflammatory/infectious in etiology. Pulmonary metastatic disease cannot be excluded and short-term follow-up imaging in 3 months is advised to reassess these nodules. 2. Stable appearance of mild hypermetabolic activity associated with right paratracheal and right hilar lymph nodes. 3. Decrease in FDG uptake associated with index right supraclavicular lymph node. 4. 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 5. Aortic Atherosclerosis (ICD10-I70.0) and Emphysema (ICD10-J43.9).    08/19/2017 -  Chemotherapy    The patient had pegfilgrastim (NEULASTA ONPRO [place]) injection 6 mg, 6 mg, Subcutaneous, Once, 12 of 14 cycles Administration: 6 mg (08/28/2017), 6 mg (09/18/2017), 6 mg (10/09/2017), 6 mg (11/02/2017), 6 mg (11/20/2017), 6 mg (12/18/2017), 6 mg (01/15/2018), 6 mg (02/12/2018), 6 mg (03/12/2018), 6 mg  (04/09/2018), 6 mg (05/07/2018) topotecan (HYCAMTIN) 2.9 mg in sodium chloride 0.9 % 100 mL chemo infusion, 1.5 mg/m2 = 2.9 mg, Intravenous,  Once, 12 of 14 cycles Dose modification: 1.2 mg/m2 (80 % of original dose 1.5 mg/m2, Cycle 4, Reason: Dose Not Tolerated) Administration: 2.9 mg (08/24/2017), 2.9 mg (08/25/2017), 2.9 mg (08/28/2017), 2.9 mg (08/26/2017), 2.9 mg (08/27/2017), 2.9 mg (09/14/2017), 2.9 mg (09/15/2017), 2.9 mg (09/16/2017), 2.9 mg (09/17/2017), 2.9 mg (09/18/2017), 2.9 mg (10/05/2017), 2.9 mg (10/06/2017), 2.9 mg (10/07/2017), 2.9 mg (10/08/2017), 2.9 mg (10/09/2017), 2.3 mg (10/26/2017), 2.3 mg (10/27/2017), 2.3 mg (10/28/2017), 2.3 mg (10/29/2017), 2.3 mg (11/02/2017), 2.3 mg (11/16/2017), 2.3 mg (11/17/2017), 2.3 mg (11/18/2017), 2.3 mg (11/19/2017), 2.3 mg (11/20/2017), 2.3 mg (12/14/2017), 2.3 mg (12/15/2017), 2.3 mg (12/16/2017), 2.3 mg (12/17/2017), 2.3 mg (12/18/2017), 2.3 mg (01/11/2018), 2.3 mg (01/12/2018), 2.3 mg (01/13/2018), 2.3 mg (01/15/2018), 2.3 mg (02/08/2018), 2.3 mg (02/09/2018), 2.3 mg (02/10/2018), 2.3 mg (02/11/2018), 2.3 mg (02/12/2018), 2.3 mg (03/08/2018), 2.3 mg (03/09/2018), 2.3 mg (03/10/2018), 2.3 mg (03/11/2018), 2.3 mg (03/12/2018), 2.2 mg (04/05/2018), 2.2 mg (04/06/2018), 2.2 mg (04/07/2018), 2.2 mg (04/08/2018), 2.2 mg (04/09/2018), 2.2 mg (05/03/2018), 2.2 mg (05/04/2018), 2.2 mg (05/05/2018), 2.2 mg (05/06/2018), 2.2 mg (05/07/2018), 2.2 mg (05/31/2018) ondansetron (ZOFRAN) 4 mg in sodium chloride 0.9 % 50 mL IVPB, , Intravenous,  Once, 7 of 9 cycles Administration:  (02/08/2018),  (02/09/2018),  (02/10/2018),  (02/11/2018),  (02/12/2018),  (03/08/2018),  (03/09/2018),  (03/10/2018),  (03/11/2018),  (03/12/2018),  (04/05/2018),  (04/06/2018),  (04/07/2018),  (04/08/2018),  (04/09/2018),  (05/03/2018),  (05/04/2018),  (05/05/2018),  (05/06/2018),  (05/07/2018),  (05/31/2018)  for chemotherapy treatment.       INTERVAL HISTORY:  Mr. Moeller 67 y.o. male returns for routine follow-up for extensive stage small cell lung cancer. Patient  is here today and tolerating treatment well. He does complain of fatigue and constipation. He take  Miralax daily which helps with his constipation. He is having increased back and shoulder pain. He state the pain medication is not helping anymore. The numbness and tingling in his hands and feet have improved with the increased dose of Gabapentin. He denies any new pains. Denies any nausea,vomiting, or diarrhea. Denies any SOB or CP. Denies any fevers or recent infections. He reports his appetite at 50% and his energy level at 75%. He is having no problems maintaining his weight at this time.    REVIEW OF SYSTEMS:  Review of Systems  Constitutional: Positive for fatigue.  Gastrointestinal: Positive for constipation.  Musculoskeletal: Positive for back pain.  Neurological: Positive for numbness.  All other systems reviewed and are negative.    PAST MEDICAL/SURGICAL HISTORY:  Past Medical History:  Diagnosis Date  . Anxiety   . CAD (coronary artery disease)   . Cancer ([place])    stage 4 small cell lung cancer   . COPD (chronic obstructive pulmonary disease) ([place])   . Depression   . Dyspnea    increased exertion  . Feeling of chest tightness   . Heart palpitations   . History of chemotherapy   . Myocardial infarction ([place])   . Osteopenia   . Panic attacks   . Smoker    Past Surgical History:  Procedure Laterality Date  . BACK SURGERY  12/24/2000   L5,S1  . CORONARY STENT PLACEMENT  2005   RCA & CX  . HERNIA REPAIR Right 1980's  . INGUINAL HERNIA REPAIR  12/1978   right side  . IR FLUORO GUIDE PORT INSERTION RIGHT  04/02/2017  . IR US GUIDE BX ASP/DRAIN  02/03/2017  . IR US GUIDE VASC ACCESS RIGHT  04/02/2017  . NM MYOCAR PERF WALL MOTION  09/07/2009   No ischemia; EF 51%  . SHOULDER SURGERY Left 08/2010  . SPINE SURGERY  2002   L5-S1     SOCIAL HISTORY:  Social History   Socioeconomic History  . Marital status: Single    Spouse name: Not on file  . Number of children: Not  on file  . Years of education: Not on file  . Highest education level: Not on file  Occupational History  . Not on file  Social Needs  . Financial resource strain: Not on file  . Food insecurity:    Worry: Not on file    Inability: Not on file  . Transportation needs:    Medical: Not on file    Non-medical: Not on file  Tobacco Use  . Smoking status: Current Every Day Smoker    Packs/day: 1.00    Types: Cigarettes  . Smokeless tobacco: Never Used  Substance and Sexual Activity  . Alcohol use: Yes    Comment: occas  . Drug use: No  . Sexual activity: Not on file  Lifestyle  . Physical activity:    Days per week: Not on file    Minutes per session: Not on file  . Stress: Not on file  Relationships  . Social connections:    Talks on phone: Not on file    Gets together: Not on file    Attends religious service: Not on file    Active member of club or organization: Not on file    Attends meetings of clubs or organizations: Not on file    Relationship status: Not on file  . Intimate partner violence:    Fear of current or ex partner: Not on file  Emotionally abused: Not on file    Physically abused: Not on file    Forced sexual activity: Not on file  Other Topics Concern  . Not on file  Social History Narrative  . Not on file    FAMILY HISTORY:  Family History  Problem Relation Age of Onset  . Heart attack Father   . Kidney disease Father        renal failure  . Heart failure Mother   . Heart attack Mother   . Cancer Brother   . Diabetes Brother   . Alcohol abuse Brother   . Diabetes Sister     CURRENT MEDICATIONS:  Outpatient Encounter Medications as of 05/31/2018  Medication Sig  . ALPRAZolam (XANAX) 1 MG tablet TAKE (1) TABLET BY MOUTH (4) TIMES DAILY.  . clotrimazole-betamethasone (LOTRISONE) cream Apply 1 application topically 2 (two) times daily.  . cyclobenzaprine (FLEXERIL) 5 MG tablet TAKE 1 TABLET THREE TIMES DAILY AS NEEDED FOR MUSCLE SPASMS.    Marland Kitchen gabapentin (NEURONTIN) 300 MG capsule Take 1 capsule (300 mg total) by mouth 2 (two) times daily.  Marland Kitchen HYDROcodone-acetaminophen (NORCO) 5-325 MG tablet Take 1 tablet by mouth every 6 (six) hours as needed for moderate pain.  Marland Kitchen lidocaine-prilocaine (EMLA) cream Apply to affected area once  . ondansetron (ZOFRAN) 8 MG tablet Take 1 tablet (8 mg total) by mouth 2 (two) times daily as needed. Start on the third day after cisplatin chemotherapy.  Marland Kitchen oxyCODONE-acetaminophen (PERCOCET/ROXICET) 5-325 MG tablet Take 1 tablet by mouth every 8 (eight) hours as needed for severe pain.  Marland Kitchen Pegfilgrastim (NEULASTA ONPRO [place]) Inject into the skin. Every 21 days  . polyethylene glycol powder (GLYCOLAX/MIRALAX) powder Take 17 g by mouth daily.  . prochlorperazine (COMPAZINE) 10 MG tablet Take 1 tablet (10 mg total) by mouth every 6 (six) hours as needed (Nausea or vomiting).  . TOPOTECAN HCL IV Inject into the vein.  Marland Kitchen topotecan in sodium chloride 0.9 % 100 mL Inject into the vein once. Days 1-5 every 21 days  . [DISCONTINUED] gabapentin (NEURONTIN) 300 MG capsule Take 1 capsule (300 mg total) by mouth 2 (two) times daily.   Facility-Administered Encounter Medications as of 05/31/2018  Medication  . [COMPLETED] 0.9 %  sodium chloride infusion  . [COMPLETED] ondansetron (ZOFRAN) 4 mg, dexamethasone (DECADRON) 4 mg in sodium chloride 0.9 % 50 mL IVPB  . sodium chloride flush (NS) 0.9 % injection 10 mL  . [COMPLETED] topotecan (HYCAMTIN) 2.2 mg in sodium chloride 0.9 % 100 mL chemo infusion    ALLERGIES:  Allergies  Allergen Reactions  . Codeine Nausea Only  . Niaspan [Niacin Er]      PHYSICAL EXAM:  ECOG Performance status: 1  VITALS SIGNS:BP:116/59, P:73, R:18, T:97.9, O2:100% Weight: 150.8  Physical Exam  Constitutional: He is oriented to person, place, and time. He appears well-developed and well-nourished.  Cardiovascular: Normal rate, regular rhythm and normal heart sounds.  Pulmonary/Chest:  Effort normal and breath sounds normal.  Musculoskeletal: Normal range of motion.  Neurological: He is alert and oriented to person, place, and time.  Skin: Skin is warm and dry.  Psychiatric: He has a normal mood and affect. His behavior is normal. Judgment and thought content normal.     LABORATORY DATA:  I have reviewed the labs as listed.  CBC    Component Value Date/Time   WBC 6.8 05/31/2018 0841   RBC 3.18 (L) 05/31/2018 0841   HGB 10.0 (L) 05/31/2018 0841   HCT 32.3 (  L) 05/31/2018 0841   PLT 372 05/31/2018 0841   MCV 101.6 (H) 05/31/2018 0841   MCH 31.4 05/31/2018 0841   MCHC 31.0 05/31/2018 0841   RDW 22.0 (H) 05/31/2018 0841   LYMPHSABS 1.1 05/31/2018 0841   MONOABS 1.0 05/31/2018 0841   EOSABS 0.1 05/31/2018 0841   BASOSABS 0.1 05/31/2018 0841   CMP Latest Ref Rng & Units 05/31/2018 05/03/2018 04/05/2018  Glucose 70 - 99 mg/dL 84 122(H) 97  BUN 8 - 23 mg/dL _0 Creatinine 0.61 - 1.24 mg/dL 0.89 0.83 0.81  Sodium 135 - 145 mmol/L 137 139 140  Potassium 3.5 - 5.1 mmol/L 3.9 4.2 4.4  Chloride 98 - 111 mmol/L 107 108 108  CO2 22 - 32 mmol/L _1 Calcium 8.9 - 10.3 mg/dL 8.6(L) 9.2 9.1  Total Protein 6.5 - 8.1 g/dL 7.0 6.9 7.2  Total Bilirubin 0.3 - 1.2 mg/dL 0.5 0.3 0.2(L)  Alkaline Phos 38 - 126 U/L 59 52 63  AST 15 - 41 U/L 15 13(L) 14(L)  ALT 0 - 44 U/L _2 DIAGNOSTIC IMAGING:  I have independently reviewed images of the CT scan and discussed with the patient.     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  Small cell lung cancer with liver metastasis: - Status post 6 cycles of cisplatin and VP-16 completed on 05/25/2017 - PET/CT scan on 08/13/2017 showing progression with bilateral lung nodules and mediastinal adenopathy -Topotecan (1.5 mg/m square) for 5 days every 21 days started on 08/24/2017, cycle 2 on 09/14/2017, cycle 3 on 10/05/2017 -CT scan of the chest abdomen and pelvis after 3  cycles showing decrease in size of the lung nodules and mediastinal adenopathy, MRI of the brain pending in [place] on 10/28/2017 - Cycle 4 Topotecan dose reduced to 1.2 mg/m2, cycle 5 on 11/16/2017, cycle duration changed to q. 4 weeks, cycle 6 on 12/14/2017. - CT scan on 01/06/2018 after 6 cycles of chemotherapy showed slight improvement in the lung lesions.  It can be considered more or less stable disease.  No new areas were seen. -Cycle 11 was done on 05/03/2018.  We discussed about the CT scan results dated 05/26/2018 showing previously identified right-sided pulmonary nodules are all stable.  No new or progressive findings of malignancy in the chest, abdomen and pelvis. -I have reviewed his blood work.  We will continue with cycle 12 today at the same dose level.  He will come back in 4 weeks for follow-up.  2.  Normocytic anemia: Last Feraheme infusion was in 10/29/2017.  His anemia has improved since we changed his chemotherapy to every 4 weeks.  3.  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  He is taking gabapentin 300 mg twice daily.  Numbness in the hands has improved.  4.  Nausea and vomiting: He is doing better in terms of nausea since we added Zofran to the premedications.  5.  Cancer/chemotherapy related pains: -He has pains in the back and bilateral shoulders.  He has been taking hydrocodone 5 mg 2-3 times a day.  Pain is not well controlled.  He tried taking 2 tablets at one time but did not help. -I will start him on Percocet 5 mg 2-3 times a day and titrated up as needed.      Orders placed this encounter:  Orders Placed This Encounter  Procedures  . Lactate dehydrogenase  . CBC with Differential/Platelet  . Comprehensive metabolic panel      David Jack, [name] [place] [phone]

## 2018-05-31 NOTE — Progress Notes (Signed)
Patient seen by oncologist with lab review and ok to treat today.   Patient tolerated treatment with no complaints voiced. Port site clean and dry with no bruising or swelling noted at site.  Dressing intact.  Good blood return noted before and after administration of chemotherapy.  VSS with discharge and left ambulatory with no s/s of distress noted.

## 2018-05-31 NOTE — Patient Instructions (Addendum)
[place] at [facility] Discharge Instructions  Follow up in 4 weeks with labs and treatment.    Thank you for choosing [place] at [facility] to provide your oncology and hematology care.  To afford each patient quality time with our provider, please arrive at least 15 minutes before your scheduled appointment time.   If you have a lab appointment with the [facility] please come in thru the  Main Entrance and check in at the main information desk  You need to re-schedule your appointment should you arrive 10 or more minutes late.  We strive to give you quality time with our providers, and arriving late affects you and other patients whose appointments are after yours.  Also, if you no show three or more times for appointments you may be dismissed from the clinic at the providers discretion.     Again, thank you for choosing [facility].  Our hope is that these requests will decrease the amount of time that you wait before being seen by our physicians.       _____________________________________________________________  Should you have questions after your visit to [facility], please contact our office at [phone] between the hours of 8:00 a.m. and 4:30 p.m.  Voicemails left after 4:00 p.m. will not be returned until the following business day.  For prescription refill requests, have your pharmacy contact our office and allow 72 hours.    Cancer Center Support Programs:   > Cancer Support Group  2nd Tuesday of the month 1pm-2pm, Journey Room

## 2018-06-01 ENCOUNTER — Ambulatory Visit: Payer: Medicare PPO | Admitting: Family Medicine

## 2018-06-01 ENCOUNTER — Inpatient Hospital Stay (HOSPITAL_COMMUNITY): Payer: Medicare PPO

## 2018-06-01 ENCOUNTER — Encounter: Payer: Self-pay | Admitting: Family Medicine

## 2018-06-01 VITALS — BP 129/50 | HR 55 | Temp 97.7°F | Resp 17

## 2018-06-01 VITALS — BP 128/60 | HR 72 | Temp 96.3°F | Resp 16 | Ht 69.0 in | Wt 153.0 lb

## 2018-06-01 DIAGNOSIS — Z5189 Encounter for other specified aftercare: Secondary | ICD-10-CM | POA: Diagnosis not present

## 2018-06-01 DIAGNOSIS — Z5111 Encounter for antineoplastic chemotherapy: Secondary | ICD-10-CM | POA: Diagnosis not present

## 2018-06-01 DIAGNOSIS — E7800 Pure hypercholesterolemia, unspecified: Secondary | ICD-10-CM | POA: Diagnosis not present

## 2018-06-01 DIAGNOSIS — I2510 Atherosclerotic heart disease of native coronary artery without angina pectoris: Secondary | ICD-10-CM | POA: Diagnosis not present

## 2018-06-01 DIAGNOSIS — C3490 Malignant neoplasm of unspecified part of unspecified bronchus or lung: Secondary | ICD-10-CM | POA: Diagnosis not present

## 2018-06-01 DIAGNOSIS — C787 Secondary malignant neoplasm of liver and intrahepatic bile duct: Secondary | ICD-10-CM | POA: Diagnosis not present

## 2018-06-01 DIAGNOSIS — C770 Secondary and unspecified malignant neoplasm of lymph nodes of head, face and neck: Secondary | ICD-10-CM | POA: Diagnosis not present

## 2018-06-01 MED ORDER — SODIUM CHLORIDE 0.9% FLUSH
10.0000 mL | INTRAVENOUS | Status: DC | PRN
  Administered 2018-06-01: 10 mL
  Filled 2018-06-01: qty 10

## 2018-06-01 MED ORDER — HEPARIN SOD (PORK) LOCK FLUSH 100 UNIT/ML IV SOLN
500.0000 [IU] | Freq: Once | INTRAVENOUS | Status: AC | PRN
  Administered 2018-06-01: 500 [IU]

## 2018-06-01 MED ORDER — TOPOTECAN HCL CHEMO INJECTION 4 MG
1.2000 mg/m2 | Freq: Once | INTRAVENOUS | Status: AC
  Administered 2018-06-01: 2.2 mg via INTRAVENOUS
  Filled 2018-06-01: qty 2.2

## 2018-06-01 MED ORDER — SODIUM CHLORIDE 0.9 % IV SOLN
Freq: Once | INTRAVENOUS | Status: AC
  Administered 2018-06-01: 14:00:00 via INTRAVENOUS
  Filled 2018-06-01: qty 2

## 2018-06-01 MED ORDER — SODIUM CHLORIDE 0.9 % IV SOLN
Freq: Once | INTRAVENOUS | Status: AC
  Administered 2018-06-01: 13:00:00 via INTRAVENOUS

## 2018-06-01 NOTE — Patient Instructions (Signed)
[facility] Discharge Instructions for Patients Receiving Chemotherapy  Today you received the following chemotherapy agents   To help prevent nausea and vomiting after your treatment, we encourage you to take your nausea medication   If you develop nausea and vomiting that is not controlled by your nausea medication, call the clinic.   BELOW ARE SYMPTOMS THAT SHOULD BE REPORTED IMMEDIATELY:  *FEVER GREATER THAN 100.5 F  *CHILLS WITH OR WITHOUT FEVER  NAUSEA AND VOMITING THAT IS NOT CONTROLLED WITH YOUR NAUSEA MEDICATION  *UNUSUAL SHORTNESS OF BREATH  *UNUSUAL BRUISING OR BLEEDING  TENDERNESS IN MOUTH AND THROAT WITH OR WITHOUT PRESENCE OF ULCERS  *URINARY PROBLEMS  *BOWEL PROBLEMS  UNUSUAL RASH Items with * indicate a potential emergency and should be followed up as soon as possible.  Feel free to call the clinic should you have any questions or concerns. The clinic phone number is [phone].  Please show the CHEMO ALERT CARD at check-in to the Emergency Department and triage nurse.   

## 2018-06-01 NOTE — Progress Notes (Signed)
Pt here today for Day 2 Topotecan. VSS. No complaints today. No changes since last visit per pt's words.   Treatment given today per MD orders. Tolerated infusion without adverse affects. Vital signs stable. No complaints at this time. Discharged from clinic ambulatory. F/U with [facility] as scheduled.
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
## 2018-06-02 ENCOUNTER — Inpatient Hospital Stay (HOSPITAL_COMMUNITY): Payer: Medicare PPO

## 2018-06-02 VITALS — BP 130/55 | HR 63 | Temp 97.7°F | Resp 18 | Wt 153.2 lb

## 2018-06-02 DIAGNOSIS — C770 Secondary and unspecified malignant neoplasm of lymph nodes of head, face and neck: Secondary | ICD-10-CM | POA: Diagnosis not present

## 2018-06-02 DIAGNOSIS — C3490 Malignant neoplasm of unspecified part of unspecified bronchus or lung: Secondary | ICD-10-CM

## 2018-06-02 DIAGNOSIS — Z5189 Encounter for other specified aftercare: Secondary | ICD-10-CM | POA: Diagnosis not present

## 2018-06-02 DIAGNOSIS — Z5111 Encounter for antineoplastic chemotherapy: Secondary | ICD-10-CM | POA: Diagnosis not present

## 2018-06-02 DIAGNOSIS — C787 Secondary malignant neoplasm of liver and intrahepatic bile duct: Secondary | ICD-10-CM | POA: Diagnosis not present

## 2018-06-02 LAB — LIPID PANEL
Cholesterol: 208 mg/dL — ABNORMAL HIGH (ref <200)
HDL: 35 mg/dL — ABNORMAL LOW (ref >40)
LDL Cholesterol (Calc): 141 mg/dL (calc) — ABNORMAL HIGH
Non-HDL Cholesterol (Calc): 173 mg/dL (calc) — ABNORMAL HIGH (ref <130)
Total CHOL/HDL Ratio: 5.9 (calc) — ABNORMAL HIGH (ref <5.0)
Triglycerides: 182 mg/dL — ABNORMAL HIGH (ref <150)

## 2018-06-02 MED ORDER — SODIUM CHLORIDE 0.9 % IV SOLN
Freq: Once | INTRAVENOUS | Status: AC
  Administered 2018-06-02: 11:00:00 via INTRAVENOUS

## 2018-06-02 MED ORDER — TOPOTECAN HCL CHEMO INJECTION 4 MG
1.2000 mg/m2 | Freq: Once | INTRAVENOUS | Status: AC
  Administered 2018-06-02: 2.2 mg via INTRAVENOUS
  Filled 2018-06-02: qty 2.2

## 2018-06-02 MED ORDER — SODIUM CHLORIDE 0.9 % IV SOLN
Freq: Once | INTRAVENOUS | Status: AC
  Administered 2018-06-02: 11:00:00 via INTRAVENOUS
  Filled 2018-06-02: qty 2

## 2018-06-02 NOTE — Progress Notes (Signed)
Tolerated infusion w/o adverse reaction.  Alert, in no distress.  VSS.  Discharged ambulatory.  

## 2018-06-03 ENCOUNTER — Inpatient Hospital Stay (HOSPITAL_COMMUNITY): Payer: Medicare PPO

## 2018-06-03 VITALS — BP 142/59 | HR 59 | Temp 97.7°F | Resp 18 | Wt 148.8 lb

## 2018-06-03 DIAGNOSIS — Z5111 Encounter for antineoplastic chemotherapy: Secondary | ICD-10-CM | POA: Diagnosis not present

## 2018-06-03 DIAGNOSIS — C3490 Malignant neoplasm of unspecified part of unspecified bronchus or lung: Secondary | ICD-10-CM

## 2018-06-03 DIAGNOSIS — C770 Secondary and unspecified malignant neoplasm of lymph nodes of head, face and neck: Secondary | ICD-10-CM | POA: Diagnosis not present

## 2018-06-03 DIAGNOSIS — Z5189 Encounter for other specified aftercare: Secondary | ICD-10-CM | POA: Diagnosis not present

## 2018-06-03 DIAGNOSIS — C787 Secondary malignant neoplasm of liver and intrahepatic bile duct: Secondary | ICD-10-CM | POA: Diagnosis not present

## 2018-06-03 MED ORDER — SODIUM CHLORIDE 0.9 % IV SOLN
Freq: Once | INTRAVENOUS | Status: AC
  Administered 2018-06-03: 12:00:00 via INTRAVENOUS

## 2018-06-03 MED ORDER — SODIUM CHLORIDE 0.9 % IV SOLN
Freq: Once | INTRAVENOUS | Status: AC
  Administered 2018-06-03: 12:00:00 via INTRAVENOUS
  Filled 2018-06-03: qty 2

## 2018-06-03 MED ORDER — TOPOTECAN HCL CHEMO INJECTION 4 MG
1.2000 mg/m2 | Freq: Once | INTRAVENOUS | Status: AC
  Administered 2018-06-03: 2.2 mg via INTRAVENOUS
  Filled 2018-06-03: qty 2.2

## 2018-06-03 MED ORDER — SODIUM CHLORIDE 0.9% FLUSH
10.0000 mL | INTRAVENOUS | Status: DC | PRN
  Administered 2018-06-03: 10 mL
  Filled 2018-06-03: qty 10

## 2018-06-03 NOTE — Progress Notes (Signed)
Patient tolerated treatment with no complaints voiced.  Port site clean and dry with no bruising or swelling noted at site.  Dressing intact.  VSS with discharge and left ambulatory with no s/s of distress noted.

## 2018-06-03 NOTE — Patient Instructions (Signed)
[facility] Discharge Instructions for Patients Receiving Chemotherapy  Today you received the following chemotherapy agents topetecan.  If you develop nausea and vomiting that is not controlled by your nausea medication, call the clinic.   BELOW ARE SYMPTOMS THAT SHOULD BE REPORTED IMMEDIATELY:  *FEVER GREATER THAN 100.5 F  *CHILLS WITH OR WITHOUT FEVER  NAUSEA AND VOMITING THAT IS NOT CONTROLLED WITH YOUR NAUSEA MEDICATION  *UNUSUAL SHORTNESS OF BREATH  *UNUSUAL BRUISING OR BLEEDING  TENDERNESS IN MOUTH AND THROAT WITH OR WITHOUT PRESENCE OF ULCERS  *URINARY PROBLEMS  *BOWEL PROBLEMS  UNUSUAL RASH Items with * indicate a potential emergency and should be followed up as soon as possible.  Feel free to call the clinic should you have any questions or concerns. The clinic phone number is [phone].  Please show the [facility] at check-in to the Emergency Department and triage nurse.

## 2018-06-04 ENCOUNTER — Inpatient Hospital Stay (HOSPITAL_COMMUNITY): Payer: Medicare PPO

## 2018-06-04 VITALS — BP 122/55 | HR 56 | Temp 97.7°F | Resp 18

## 2018-06-04 DIAGNOSIS — Z5111 Encounter for antineoplastic chemotherapy: Secondary | ICD-10-CM | POA: Diagnosis not present

## 2018-06-04 DIAGNOSIS — C3490 Malignant neoplasm of unspecified part of unspecified bronchus or lung: Secondary | ICD-10-CM | POA: Diagnosis not present

## 2018-06-04 DIAGNOSIS — Z5189 Encounter for other specified aftercare: Secondary | ICD-10-CM | POA: Diagnosis not present

## 2018-06-04 DIAGNOSIS — C787 Secondary malignant neoplasm of liver and intrahepatic bile duct: Secondary | ICD-10-CM | POA: Diagnosis not present

## 2018-06-04 DIAGNOSIS — C770 Secondary and unspecified malignant neoplasm of lymph nodes of head, face and neck: Secondary | ICD-10-CM | POA: Diagnosis not present

## 2018-06-04 MED ORDER — SODIUM CHLORIDE 0.9% FLUSH
10.0000 mL | INTRAVENOUS | Status: DC | PRN
  Administered 2018-06-04: 10 mL
  Filled 2018-06-04: qty 10

## 2018-06-04 MED ORDER — TOPOTECAN HCL CHEMO INJECTION 4 MG
1.2000 mg/m2 | Freq: Once | INTRAVENOUS | Status: AC
  Administered 2018-06-04: 2.2 mg via INTRAVENOUS
  Filled 2018-06-04: qty 2.2

## 2018-06-04 MED ORDER — SODIUM CHLORIDE 0.9 % IV SOLN
Freq: Once | INTRAVENOUS | Status: AC
  Administered 2018-06-04: 13:00:00 via INTRAVENOUS

## 2018-06-04 MED ORDER — PEGFILGRASTIM 6 MG/0.6ML ~~LOC~~ PSKT
6.0000 mg | PREFILLED_SYRINGE | Freq: Once | SUBCUTANEOUS | Status: AC
  Administered 2018-06-04: 6 mg via SUBCUTANEOUS
  Filled 2018-06-04: qty 0.6

## 2018-06-04 MED ORDER — HEPARIN SOD (PORK) LOCK FLUSH 100 UNIT/ML IV SOLN
500.0000 [IU] | Freq: Once | INTRAVENOUS | Status: AC | PRN
  Administered 2018-06-04: 500 [IU]

## 2018-06-04 MED ORDER — SODIUM CHLORIDE 0.9 % IV SOLN
Freq: Once | INTRAVENOUS | Status: AC
  Administered 2018-06-04: 13:00:00 via INTRAVENOUS
  Filled 2018-06-04: qty 2

## 2018-06-04 NOTE — Progress Notes (Signed)
Nutrition Follow-up:   Patient with extensive small cell lung cancer.  Patient receiving chemotherapy.    Met with patient during infusion today.  Patient reports so-so appetite.  Reports that he has been still trying to eat foods high in protein and calories, smoothies, peanut butter, burgers, etc. Portion sizes are smaller.  "I just can't eat much any more."      Medications: reviewed  Labs: reviewed  Anthropometrics:   Weight decreased to 148 lb 12.8 oz on 11/21   NUTRITION DIAGNOSIS: malnutrition declined   MALNUTRITION DIAGNOSIS: severe malnutrition continues   INTERVENTION:  Reviewed high calorie, high protein food options.   Gave patient 3rd case of ensure enlive.     MONITORING, EVALUATION, GOAL: weight trends, intake   NEXT VISIT: December 20 during infusion  Joli B. Zenia Resides, [place], [place] Registered Dietitian [phone] (pager)

## 2018-06-04 NOTE — Patient Instructions (Signed)
[facility] Discharge Instructions for Patients Receiving Chemotherapy   Beginning January 23rd 2017 lab work for the Cancer Center will be done in the  Main lab at [facility] on 1st floor. If you have a lab appointment with the Cancer Center please come in thru the  Main Entrance and check in at the main information desk   Today you received the following chemotherapy agents   To help prevent nausea and vomiting after your treatment, we encourage you to take your nausea medication     If you develop nausea and vomiting, or diarrhea that is not controlled by your medication, call the clinic.  The clinic phone number is [phone]. Office hours are Monday-Friday 8:30am-5:00pm.  BELOW ARE SYMPTOMS THAT SHOULD BE REPORTED IMMEDIATELY:  *FEVER GREATER THAN 101.0 F  *CHILLS WITH OR WITHOUT FEVER  NAUSEA AND VOMITING THAT IS NOT CONTROLLED WITH YOUR NAUSEA MEDICATION  *UNUSUAL SHORTNESS OF BREATH  *UNUSUAL BRUISING OR BLEEDING  TENDERNESS IN MOUTH AND THROAT WITH OR WITHOUT PRESENCE OF ULCERS  *URINARY PROBLEMS  *BOWEL PROBLEMS  UNUSUAL RASH Items with * indicate a potential emergency and should be followed up as soon as possible. If you have an emergency after office hours please contact your primary care physician or go to the nearest emergency department.  Please call the clinic during office hours if you have any questions or concerns.   You may also contact the Patient Navigator at [phone] should you have any questions or need assistance in obtaining follow up care.      Resources For Cancer Patients and their Caregivers ? American Cancer Society: Can assist with transportation, wigs, general needs, runs Look Good Feel Better.        1-888-227-6333 ? Cancer Care: Provides financial assistance, online support groups, medication/co-pay assistance.  1-800-813-HOPE (4673) ? [facility] Assists [place] cancer  patients and their families through emotional , educational and financial support.  336-427-4357 ? [place] DSS Where to apply for food stamps, Medicaid and utility assistance. 336-342-1394 ? [facility]: Transportation to medical appointments. 336-347-2287 ? Social Security Administration: May apply for disability if have a Stage IV cancer. 336-342-7796 1-800-772-1213 ? [place] Aging, Disability and Transit Services: Assists with nutrition, care and transit needs. 336-349-2343         

## 2018-06-04 NOTE — Progress Notes (Signed)
Pt presents today for Hycamtin and Neulasta. VSS . Pt denies any changes since last visit. MAR reviewed.   Nutritionist at bedside assessing a diet plan for patient.

## 2018-06-04 NOTE — Progress Notes (Signed)
Treatment given per orders. Patient tolerated it well without problems. Vitals stable and discharged home from clinic ambulatory. Follow up as scheduled.   David KitchenHarvie Greer arrived today for [name] neulasta on body injector. See MAR for administration details. Injector in place and engaged with green light indicator on flashing. Tolerated application with out problems.

## 2018-06-07 DIAGNOSIS — C3490 Malignant neoplasm of unspecified part of unspecified bronchus or lung: Secondary | ICD-10-CM | POA: Diagnosis not present

## 2018-06-08 ENCOUNTER — Telehealth: Payer: Self-pay | Admitting: Family Medicine

## 2018-06-08 MED ORDER — ATORVASTATIN CALCIUM 40 MG PO TABS: 40.0000 mg | ORAL_TABLET | Freq: Every day | ORAL | 1 refills | Status: DC

## 2018-06-08 NOTE — Telephone Encounter (Signed)
Patient would like crestor sent to [facility]  [phone]

## 2018-06-08 NOTE — Telephone Encounter (Signed)
Per Dr. Dennard Schaumann it was Lipitor instead of Crestor and med sent to [facility]

## 2018-06-14 ENCOUNTER — Other Ambulatory Visit: Payer: Self-pay | Admitting: Family Medicine

## 2018-06-14 NOTE — Telephone Encounter (Signed)
Pt is requesting refill on Xanax   LOV: 06/01/18  LRF:   05/14/18

## 2018-06-16 DIAGNOSIS — Z9221 Personal history of antineoplastic chemotherapy: Secondary | ICD-10-CM | POA: Diagnosis not present

## 2018-06-16 DIAGNOSIS — C7931 Secondary malignant neoplasm of brain: Secondary | ICD-10-CM | POA: Diagnosis not present

## 2018-06-16 DIAGNOSIS — C3411 Malignant neoplasm of upper lobe, right bronchus or lung: Secondary | ICD-10-CM | POA: Diagnosis not present

## 2018-06-28 ENCOUNTER — Inpatient Hospital Stay (HOSPITAL_COMMUNITY): Payer: Medicare PPO | Attending: Hematology

## 2018-06-28 ENCOUNTER — Inpatient Hospital Stay (HOSPITAL_BASED_OUTPATIENT_CLINIC_OR_DEPARTMENT_OTHER): Payer: Medicare PPO | Admitting: Hematology

## 2018-06-28 ENCOUNTER — Encounter (HOSPITAL_COMMUNITY): Payer: Self-pay | Admitting: Hematology

## 2018-06-28 ENCOUNTER — Inpatient Hospital Stay (HOSPITAL_COMMUNITY): Payer: Medicare PPO

## 2018-06-28 VITALS — BP 108/56 | HR 56 | Temp 97.6°F | Resp 18 | Wt 154.2 lb

## 2018-06-28 DIAGNOSIS — R112 Nausea with vomiting, unspecified: Secondary | ICD-10-CM | POA: Diagnosis not present

## 2018-06-28 DIAGNOSIS — Z5111 Encounter for antineoplastic chemotherapy: Secondary | ICD-10-CM | POA: Diagnosis not present

## 2018-06-28 DIAGNOSIS — T451X5A Adverse effect of antineoplastic and immunosuppressive drugs, initial encounter: Secondary | ICD-10-CM | POA: Diagnosis not present

## 2018-06-28 DIAGNOSIS — D649 Anemia, unspecified: Secondary | ICD-10-CM

## 2018-06-28 DIAGNOSIS — F17210 Nicotine dependence, cigarettes, uncomplicated: Secondary | ICD-10-CM | POA: Diagnosis not present

## 2018-06-28 DIAGNOSIS — C770 Secondary and unspecified malignant neoplasm of lymph nodes of head, face and neck: Secondary | ICD-10-CM

## 2018-06-28 DIAGNOSIS — G620 Drug-induced polyneuropathy: Secondary | ICD-10-CM

## 2018-06-28 DIAGNOSIS — C3490 Malignant neoplasm of unspecified part of unspecified bronchus or lung: Secondary | ICD-10-CM | POA: Insufficient documentation

## 2018-06-28 DIAGNOSIS — C787 Secondary malignant neoplasm of liver and intrahepatic bile duct: Secondary | ICD-10-CM | POA: Insufficient documentation

## 2018-06-28 DIAGNOSIS — G893 Neoplasm related pain (acute) (chronic): Secondary | ICD-10-CM | POA: Diagnosis not present

## 2018-06-28 LAB — COMPREHENSIVE METABOLIC PANEL
ALT: 22 U/L (ref 0–44)
AST: 16 U/L (ref 15–41)
Albumin: 3.6 g/dL (ref 3.5–5.0)
Alkaline Phosphatase: 53 U/L (ref 38–126)
Anion gap: 5 (ref 5–15)
BUN: 20 mg/dL (ref 8–23)
CO2: 26 mmol/L (ref 22–32)
Calcium: 8.6 mg/dL — ABNORMAL LOW (ref 8.9–10.3)
Chloride: 106 mmol/L (ref 98–111)
Creatinine, Ser: 0.91 mg/dL (ref 0.61–1.24)
GFR calc Af Amer: >60 mL/min (ref >60)
GFR calc non Af Amer: >60 mL/min (ref >60)
Glucose, Bld: 104 mg/dL — ABNORMAL HIGH (ref 70–99)
Potassium: 4.3 mmol/L (ref 3.5–5.1)
Sodium: 137 mmol/L (ref 135–145)
Total Bilirubin: 0.5 mg/dL (ref 0.3–1.2)
Total Protein: 6.3 g/dL — ABNORMAL LOW (ref 6.5–8.1)

## 2018-06-28 LAB — CBC WITH DIFFERENTIAL/PLATELET
Abs Immature Granulocytes: 0.04 10*3/uL (ref 0.00–0.07)
Basophils Absolute: 0 10*3/uL (ref 0.0–0.1)
Basophils Relative: 1 %
Eosinophils Absolute: 0.1 10*3/uL (ref 0.0–0.5)
Eosinophils Relative: 1 %
HCT: 30.2 % — ABNORMAL LOW (ref 39.0–52.0)
Hemoglobin: 9.4 g/dL — ABNORMAL LOW (ref 13.0–17.0)
Immature Granulocytes: 1 %
Lymphocytes Relative: 20 %
Lymphs Abs: 1.2 10*3/uL (ref 0.7–4.0)
MCH: 31.2 pg (ref 26.0–34.0)
MCHC: 31.1 g/dL (ref 30.0–36.0)
MCV: 100.3 fL — ABNORMAL HIGH (ref 80.0–100.0)
Monocytes Absolute: 0.8 10*3/uL (ref 0.1–1.0)
Monocytes Relative: 14 %
Neutro Abs: 3.9 10*3/uL (ref 1.7–7.7)
Neutrophils Relative %: 63 %
Platelets: 298 10*3/uL (ref 150–400)
RBC: 3.01 MIL/uL — ABNORMAL LOW (ref 4.22–5.81)
RDW: 21.3 % — ABNORMAL HIGH (ref 11.5–15.5)
WBC: 6.1 10*3/uL (ref 4.0–10.5)
nRBC: 0 % (ref 0.0–0.2)

## 2018-06-28 LAB — LACTATE DEHYDROGENASE: LDH: 126 U/L (ref 98–192)

## 2018-06-28 MED ORDER — OXYCODONE-ACETAMINOPHEN 5-325 MG PO TABS: 1.0000 | ORAL_TABLET | Freq: Three times a day (TID) | ORAL | 0 refills | Status: DC | PRN

## 2018-06-28 MED ORDER — SODIUM CHLORIDE 0.9 % IV SOLN
Freq: Once | INTRAVENOUS | Status: AC
  Administered 2018-06-28: 10:00:00 via INTRAVENOUS

## 2018-06-28 MED ORDER — SODIUM CHLORIDE 0.9 % IV SOLN
Freq: Once | INTRAVENOUS | Status: AC
  Administered 2018-06-28: 10:00:00 via INTRAVENOUS
  Filled 2018-06-28: qty 2

## 2018-06-28 MED ORDER — TOPOTECAN HCL CHEMO INJECTION 4 MG
1.2000 mg/m2 | Freq: Once | INTRAVENOUS | Status: AC
  Administered 2018-06-28: 2.2 mg via INTRAVENOUS
  Filled 2018-06-28: qty 2.2

## 2018-06-28 NOTE — Progress Notes (Signed)
Tolerated infusion w/o adverse reaction.  Alert, in no distress.  VSS.  Discharged ambulatory.  

## 2018-06-28 NOTE — Progress Notes (Signed)
[place], [place] [address]   CLINIC:  Medical Oncology/Hematology  PCP:  David Frizzle, MD [address] [facility] [place] [address] [phone]   REASON FOR VISIT: Follow-up for extensive stage small cell lung cancer.   CURRENT THERAPY: Topotecan every 4 weeks.  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 Bulky 5.4 cm right supraclavicular region malignant lymph node conglomeration with extracapsular extension. 2. Surrounding smaller abnormal right level 3 and level 5 lymph nodes, and the lymphadenopathy continues into the superior mediastinum, see Chest CT findings reported separately. 3. No other metastatic disease identified in the neck.    01/16/2017 Imaging    CT chest: IMPRESSION: 1. Extensive lymphadenopathy in the thorax and lower right cervical region, as discussed above. Primary differential considerations include lymphoma/leukemia or small cell carcinoma of the lung. Further evaluation a PET-CT could be considered to assess for additional sites of disease below the diaphragm if clinically appropriate. Additionally, ultrasound-guided biopsy of supraclavicular lymphadenopathy could be considered to establish a tissue diagnosis. 2. Indeterminate lesion in the periphery of segment 8 of the liver measuring 2.7 x 1.7 cm. Attention at time of follow-up PET-CT is recommended. 3. Aortic atherosclerosis, in addition to left main and 3 vessel coronary artery disease. Please note that although the presence of coronary artery calcium documents the presence of coronary artery disease, the severity of this disease and any potential stenosis cannot be assessed on this non-gated CT examination. Assessment for potential risk factor modification, dietary therapy or pharmacologic therapy may be warranted, if clinically indicated. 4. There are  calcifications of the aortic valve. Echocardiographic correlation for evaluation of potential valvular dysfunction may be warranted if clinically indicated. 5. Diffuse bronchial wall thickening with moderate centrilobular and paraseptal emphysema; imaging findings suggestive of underlying COPD.    02/03/2017 Initial Biopsy    (R) neck lymph node biopsy: SMALL CELL CARCINOMA (most likely lung primary).     02/03/2017 Miscellaneous    Port-a-cath attempted by IR; unable to place d/t enlarged SVC.     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 Staging for metastatic disease  EXAM: MRI HEAD WITHOUT AND WITH CONTRAST  TECHNIQUE: Multiplanar, multiecho pulse sequences of the brain and surrounding structures were obtained without and with intravenous contrast.  CONTRAST:  49m MULTIHANCE GADOBENATE DIMEGLUMINE 529 MG/ML IV SOLN  COMPARISON:  None.  FINDINGS: Brain: Negative for hydrocephalus. Cerebral volume normal for age. Small nonenhancing white matter hyperintensities consistent with mild chronic microvascular ischemia. No acute infarct. Negative for hemorrhage or mass or edema  Normal enhancement postcontrast infusion. No enhancing mass lesion. Leptomeningeal enhancement is normal.  Vascular: Normal arterial flow voids.  Normal venous enhancement  Skull and upper cervical spine: Negative  Sinuses/Orbits: Negative  Other: None  IMPRESSION: Negative for metastatic disease.  No acute abnormality.  Mild chronic white matter changes.    04/07/2017 Imaging     PET:  1. Marked reduction in size and metabolic activity of bulky RIGHT supraclavicular adenopathy mediastinal lymphadenopathy. 2. Residual moderate activity remains within small  RIGHT supraclavicular lymph node, RIGHT lower paratracheal lymph node and RIGHT  hilar lymph node. 3. Resolution of prevascular and internal mammary mediastinal metastatic hypermetabolic activity. 4. Resolution of metabolic activity associated with solitary RIGHT hepatic lobe liver metastasis. 5. No evidence of disease progression. 6. 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    05/27/2017 Imaging    MRI brain w/ and w/o contrast IMPRESSION: 1. No metastatic disease identified. 2. Increased nonspecific cerebral white matter signal changes since August. These are most commonly small vessel disease related. 3. New right maxillary sinusitis. Benign appearing retention cysts in the nasopharynx with trace mastoid effusions.    06/12/2017 Imaging    PET-CT IMPRESSION: 1. There are two new hypermetabolic nodules identified within both lower lobes measuring up to 3.1 cm. The appearance is nonspecific and may be inflammatory/infectious in etiology. Pulmonary metastatic disease cannot be excluded and short-term follow-up imaging in 3 months is advised to reassess these nodules. 2. Stable appearance of mild hypermetabolic activity associated with right paratracheal and right hilar lymph nodes. 3. Decrease in FDG uptake associated with index right supraclavicular lymph node. 4. 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 5. Aortic Atherosclerosis (ICD10-I70.0) and Emphysema (ICD10-J43.9).    08/24/2017 -  Chemotherapy    The patient had pegfilgrastim (NEULASTA ONPRO KIT) injection 6 mg, 6 mg, Subcutaneous, Once, 13 of 14 cycles Administration: 6 mg (08/28/2017), 6 mg (09/18/2017), 6 mg (10/09/2017), 6 mg (11/02/2017), 6 mg (11/20/2017), 6 mg (12/18/2017), 6 mg (01/15/2018), 6 mg (02/12/2018), 6 mg (03/12/2018), 6 mg  (04/09/2018), 6 mg (05/07/2018), 6 mg (06/04/2018) topotecan (HYCAMTIN) 2.9 mg in sodium chloride 0.9 % 100 mL chemo infusion, 1.5 mg/m2 = 2.9 mg, Intravenous,  Once, 13 of 14 cycles Dose modification: 1.2 mg/m2 (80 % of original dose 1.5 mg/m2, Cycle 4, Reason: Dose Not Tolerated) Administration: 2.9 mg (08/24/2017), 2.9 mg (08/25/2017), 2.9 mg (08/28/2017), 2.9 mg (08/26/2017), 2.9 mg (08/27/2017), 2.9 mg (09/14/2017), 2.9 mg (09/15/2017), 2.9 mg (09/16/2017), 2.9 mg (09/17/2017), 2.9 mg (09/18/2017), 2.9 mg (10/05/2017), 2.9 mg (10/06/2017), 2.9 mg (10/07/2017), 2.9 mg (10/08/2017), 2.9 mg (10/09/2017), 2.3 mg (10/26/2017), 2.3 mg (10/27/2017), 2.3 mg (10/28/2017), 2.3 mg (10/29/2017), 2.3 mg (11/02/2017), 2.3 mg (11/16/2017), 2.3 mg (11/17/2017), 2.3 mg (11/18/2017), 2.3 mg (11/19/2017), 2.3 mg (11/20/2017), 2.3 mg (12/14/2017), 2.3 mg (12/15/2017), 2.3 mg (12/16/2017), 2.3 mg (12/17/2017), 2.3 mg (12/18/2017), 2.3 mg (01/11/2018), 2.3 mg (01/12/2018), 2.3 mg (01/13/2018), 2.3 mg (01/15/2018), 2.3 mg (02/08/2018), 2.3 mg (02/09/2018), 2.3 mg (02/10/2018), 2.3 mg (02/11/2018), 2.3 mg (02/12/2018), 2.3 mg (03/08/2018), 2.3 mg (03/09/2018), 2.3 mg (03/10/2018), 2.3 mg (03/11/2018), 2.3 mg (03/12/2018), 2.2 mg (04/05/2018), 2.2 mg (04/06/2018), 2.2 mg (04/07/2018), 2.2 mg (04/08/2018), 2.2 mg (04/09/2018), 2.2 mg (05/03/2018), 2.2 mg (05/04/2018), 2.2 mg (05/05/2018), 2.2 mg (05/06/2018), 2.2 mg (05/07/2018), 2.2 mg (05/31/2018), 2.2 mg (06/01/2018), 2.2 mg (06/02/2018), 2.2 mg (06/03/2018), 2.2 mg (06/04/2018) ondansetron (ZOFRAN) 4 mg in sodium chloride 0.9 % 50 mL IVPB, , Intravenous,  Once, 8 of 9 cycles Administration:  (02/08/2018),  (02/09/2018),  (02/10/2018),  (02/11/2018),  (02/12/2018),  (03/08/2018),  (03/09/2018),  (03/10/2018),  (03/11/2018),  (03/12/2018),  (04/05/2018),  (04/06/2018),  (04/07/2018),  (04/08/2018),  (04/09/2018),  (05/03/2018),  (05/04/2018),  (05/05/2018),  (05/06/2018),  (05/07/2018),  (05/31/2018),  (06/01/2018),  (06/02/2018),  (06/03/2018),  (06/04/2018)  for  chemotherapy treatment.       INTERVAL HISTORY:  [name] 67 y.o. male returns for routine follow-up for  extensive stage small cell lung cancer. He is doing well today and ready for his treatment. He does report diarrhea occasionally but his imodium helps. He has back pain daily. He is needing 1 percocet daily in the morning for his back pain and it take care of the pain all day. He denies any nausea or vomiting. Denies any bleeding or easy bruising. Denies any fevers or recent infections. He report his appetite and energy level at 50% and he has no problem maintaining his weight.     REVIEW OF SYSTEMS:  Review of Systems  Gastrointestinal: Positive for diarrhea.  Neurological: Positive for numbness.  Psychiatric/Behavioral: Positive for sleep disturbance.  All other systems reviewed and are negative.    PAST MEDICAL/SURGICAL HISTORY:  Past Medical History:  Diagnosis Date  . Anxiety   . CAD (coronary artery disease)   . Cancer ([place])    stage 4 small cell lung cancer   . COPD (chronic obstructive pulmonary disease) ([place])   . Depression   . Dyspnea    increased exertion  . Feeling of chest tightness   . Heart palpitations   . History of chemotherapy   . Myocardial infarction ([place])   . Osteopenia   . Panic attacks   . Smoker    Past Surgical History:  Procedure Laterality Date  . BACK SURGERY  12/24/2000   L5,S1  . CORONARY STENT PLACEMENT  2005   RCA & CX  . HERNIA REPAIR Right 1980's  . INGUINAL HERNIA REPAIR  12/1978   right side  . IR FLUORO GUIDE PORT INSERTION RIGHT  04/02/2017  . IR US GUIDE BX ASP/DRAIN  02/03/2017  . IR US GUIDE VASC ACCESS RIGHT  04/02/2017  . NM MYOCAR PERF WALL MOTION  09/07/2009   No ischemia; EF 51%  . SHOULDER SURGERY Left 08/2010  . SPINE SURGERY  2002   L5-S1     SOCIAL HISTORY:  Social History   Socioeconomic History  . Marital status: Single    Spouse name: Not on file  . Number of children: Not on file  . Years of  education: Not on file  . Highest education level: Not on file  Occupational History  . Not on file  Social Needs  . Financial resource strain: Not on file  . Food insecurity:    Worry: Not on file    Inability: Not on file  . Transportation needs:    Medical: Not on file    Non-medical: Not on file  Tobacco Use  . Smoking status: Current Every Day Smoker    Packs/day: 1.00    Types: Cigarettes  . Smokeless tobacco: Never Used  Substance and Sexual Activity  . Alcohol use: Yes    Comment: occas  . Drug use: No  . Sexual activity: Not on file  Lifestyle  . Physical activity:    Days per week: Not on file    Minutes per session: Not on file  . Stress: Not on file  Relationships  . Social connections:    Talks on phone: Not on file    Gets together: Not on file    Attends religious service: Not on file    Active member of club or organization: Not on file    Attends meetings of clubs or organizations: Not on file    Relationship status: Not on file  . Intimate partner violence:    Fear of current or ex partner: Not on file    Emotionally abused:  Not on file    Physically abused: Not on file    Forced sexual activity: Not on file  Other Topics Concern  . Not on file  Social History Narrative  . Not on file    FAMILY HISTORY:  Family History  Problem Relation Age of Onset  . Heart attack Father   . Kidney disease Father        renal failure  . Heart failure Mother   . Heart attack Mother   . Cancer Brother   . Diabetes Brother   . Alcohol abuse Brother   . Diabetes Sister     CURRENT MEDICATIONS:  Outpatient Encounter Medications as of 06/28/2018  Medication Sig  . ALPRAZolam (XANAX) 1 MG tablet TAKE (1) TABLET BY MOUTH (4) TIMES DAILY.  Marland Kitchen atorvastatin (LIPITOR) 40 MG tablet Take 1 tablet (40 mg total) by mouth daily.  . clotrimazole-betamethasone (LOTRISONE) cream Apply 1 application topically 2 (two) times daily.  . cyclobenzaprine (FLEXERIL) 5 MG tablet  TAKE 1 TABLET THREE TIMES DAILY AS NEEDED FOR MUSCLE SPASMS.  Marland Kitchen gabapentin (NEURONTIN) 300 MG capsule Take 1 capsule (300 mg total) by mouth 2 (two) times daily.  Marland Kitchen lidocaine-prilocaine (EMLA) cream Apply to affected area once  . ondansetron (ZOFRAN) 8 MG tablet Take 1 tablet (8 mg total) by mouth 2 (two) times daily as needed. Start on the third day after cisplatin chemotherapy.  Marland Kitchen oxyCODONE-acetaminophen (PERCOCET/ROXICET) 5-325 MG tablet Take 1 tablet by mouth every 8 (eight) hours as needed for severe pain.  Marland Kitchen Pegfilgrastim (NEULASTA ONPRO [place]) Inject into the skin. Every 21 days  . polyethylene glycol powder (GLYCOLAX/MIRALAX) powder Take 17 g by mouth daily.  . prochlorperazine (COMPAZINE) 10 MG tablet Take 1 tablet (10 mg total) by mouth every 6 (six) hours as needed (Nausea or vomiting).  . TOPOTECAN HCL IV Inject into the vein.  Marland Kitchen topotecan in sodium chloride 0.9 % 100 mL Inject into the vein once. Days 1-5 every 21 days   No facility-administered encounter medications on file as of 06/28/2018.     ALLERGIES:  Allergies  Allergen Reactions  . Codeine Nausea Only  . Niaspan [Niacin Er]      PHYSICAL EXAM:  ECOG Performance status: 1  VITAL SIGN: BP:127/44, P:66, R:18, T:98.4, O2:97% WEIGHT: 154.2  Physical Exam Constitutional:      Appearance: Normal appearance. He is normal weight.  Cardiovascular:     Rate and Rhythm: Normal rate and regular rhythm.     Heart sounds: Normal heart sounds.  Pulmonary:     Effort: Pulmonary effort is normal.     Breath sounds: Normal breath sounds.  Musculoskeletal: Normal range of motion.  Skin:    General: Skin is warm and dry.  Neurological:     Mental Status: He is alert and oriented to person, place, and time. Mental status is at baseline.  Psychiatric:        Mood and Affect: Mood normal.        Behavior: Behavior normal.        Thought Content: Thought content normal.        Judgment: Judgment normal.      LABORATORY  DATA:  I have reviewed the labs as listed.  CBC    Component Value Date/Time   WBC 6.1 06/28/2018 0846   RBC 3.01 (L) 06/28/2018 0846   HGB 9.4 (L) 06/28/2018 0846   HCT 30.2 (L) 06/28/2018 0846   PLT 298 06/28/2018 0846   MCV 100.3 (  H) 06/28/2018 0846   MCH 31.2 06/28/2018 0846   MCHC 31.1 06/28/2018 0846   RDW 21.3 (H) 06/28/2018 0846   LYMPHSABS 1.2 06/28/2018 0846   MONOABS 0.8 06/28/2018 0846   EOSABS 0.1 06/28/2018 0846   BASOSABS 0.0 06/28/2018 0846   CMP Latest Ref Rng & Units 06/28/2018 05/31/2018 05/03/2018  Glucose 70 - 99 mg/dL 104(H) 84 122(H)  BUN 8 - 23 mg/dL _0 Creatinine 0.61 - 1.24 mg/dL 0.91 0.89 0.83  Sodium 135 - 145 mmol/L 137 137 139  Potassium 3.5 - 5.1 mmol/L 4.3 3.9 4.2  Chloride 98 - 111 mmol/L 106 107 108  CO2 22 - 32 mmol/L _1 Calcium 8.9 - 10.3 mg/dL 8.6(L) 8.6(L) 9.2  Total Protein 6.5 - 8.1 g/dL 6.3(L) 7.0 6.9  Total Bilirubin 0.3 - 1.2 mg/dL 0.5 0.5 0.3  Alkaline Phos 38 - 126 U/L 53 59 52  AST 15 - 41 U/L 16 15 13(L)  ALT 0 - 44 U/L _2 DIAGNOSTIC IMAGING:  I have independently reviewed the scans and discussed with the patient.   I have reviewed David Finders, NP's note and agree with the documentation.  I personally performed a face-to-face visit, made revisions and my assessment and plan is as follows.    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  Small cell lung cancer with liver metastasis: - Status post 6 cycles of cisplatin and VP-16 completed on 05/25/2017 - PET/CT scan on 08/13/2017 showing progression with bilateral lung nodules and mediastinal adenopathy -Topotecan (1.5 mg/m square) for 5 days every 21 days started on 08/24/2017, cycle 2 on 09/14/2017, cycle 3 on 10/05/2017 -CT scan of the chest abdomen and pelvis after 3 cycles showing decrease in size of the lung nodules and mediastinal adenopathy, MRI of the brain pending in [place] on 10/28/2017 - Therapeutic  and dose was reduced to 1.2 mg/m during cycle 4 and cycle duration changed to every 4 weeks during cycle 5.  - CT scan on 01/06/2018 after 6 cycles of chemotherapy showed slight improvement in the lung lesions.  It can be considered more or less stable disease.  No new areas were seen. - CT scan on 05/16/2018 after 10 cycles showed previously identified right-sided pulmonary nodules stable.  No new or progressive findings of malignancy in the chest, abdomen and pelvis. - I have reviewed his blood work today.  He may proceed with cycle 13.  Continuing to tolerate very well. -I plan to repeat CT scan 3 to 4 months after the last scan. -I will see him back in 4 weeks for follow-up.  2.  Normocytic anemia: -This is chemotherapy-induced.  Last Feraheme infusion was on 10/29/2017.  Anemia improved after chemotherapy was switched to every 4 weeks.   3.  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  He is taking gabapentin 300 mg twice daily.  Numbness in the hands has improved.  4.  Nausea and vomiting: He is doing better in terms of nausea since we added Zofran to the premedications.  5. Cancer/chemotherapy related pains: -He has low back pain.  He takes 1 tablet of Percocet 5 mg in the mornings which helped with the pain throughout the day. -We will send him a refill.      Orders placed this encounter:  Orders Placed This Encounter  Procedures  . CBC with  Differential/Platelet  . Comprehensive metabolic panel      Derek Jack, MD [place] [phone]

## 2018-06-28 NOTE — Assessment & Plan Note (Signed)
1.  Small cell lung cancer with liver metastasis: - Status post 6 cycles of cisplatin and VP-16 completed on 05/25/2017 - PET/CT scan on 08/13/2017 showing progression with bilateral lung nodules and mediastinal adenopathy -Topotecan (1.5 mg/m square) for 5 days every 21 days started on 08/24/2017, cycle 2 on 09/14/2017, cycle 3 on 10/05/2017 -CT scan of the chest abdomen and pelvis after 3 cycles showing decrease in size of the lung nodules and mediastinal adenopathy, MRI of the brain pending in [place] on 10/28/2017 - Therapeutic and dose was reduced to 1.2 mg/m during cycle 4 and cycle duration changed to every 4 weeks during cycle 5.  - CT scan on 01/06/2018 after 6 cycles of chemotherapy showed slight improvement in the lung lesions.  It can be considered more or less stable disease.  No new areas were seen. - CT scan on 05/16/2018 after 10 cycles showed previously identified right-sided pulmonary nodules stable.  No new or progressive findings of malignancy in the chest, abdomen and pelvis. - I have reviewed his blood work today.  He may proceed with cycle 13.  Continuing to tolerate very well. -I plan to repeat CT scan 3 to 4 months after the last scan. -I will see him back in 4 weeks for follow-up.  2.  Normocytic anemia: -This is chemotherapy-induced.  Last Feraheme infusion was on 10/29/2017.  Anemia improved after chemotherapy was switched to every 4 weeks.   3.  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  He is taking gabapentin 300 mg twice daily.  Numbness in the hands has improved.  4.  Nausea and vomiting: He is doing better in terms of nausea since we added Zofran to the premedications.  5. Cancer/chemotherapy related pains: -He has low back pain.  He takes 1 tablet of Percocet 5 mg in the mornings which helped with the pain throughout the day. -We will send him a refill.

## 2018-06-28 NOTE — Patient Instructions (Signed)
[place] at [name] Discharge Instructions  Follow up in 4 weeks with labs and treatment.    Thank you for choosing [place] at [facility] to provide your oncology and hematology care.  To afford each patient quality time with our provider, please arrive at least 15 minutes before your scheduled appointment time.   If you have a lab appointment with the [facility] please come in thru the  Main Entrance and check in at the main information desk  You need to re-schedule your appointment should you arrive 10 or more minutes late.  We strive to give you quality time with our providers, and arriving late affects you and other patients whose appointments are after yours.  Also, if you no show three or more times for appointments you may be dismissed from the clinic at the providers discretion.     Again, thank you for choosing [facility].  Our hope is that these requests will decrease the amount of time that you wait before being seen by our physicians.       _____________________________________________________________  Should you have questions after your visit to [facility], please contact our office at [phone] between the hours of 8:00 a.m. and 4:30 p.m.  Voicemails left after 4:00 p.m. will not be returned until the following business day.  For prescription refill requests, have your pharmacy contact our office and allow 72 hours.    Cancer Center Support Programs:   > Cancer Support Group  2nd Tuesday of the month 1pm-2pm, Journey Room

## 2018-06-29 ENCOUNTER — Encounter (HOSPITAL_COMMUNITY): Payer: Self-pay

## 2018-06-29 ENCOUNTER — Inpatient Hospital Stay (HOSPITAL_COMMUNITY): Payer: Medicare PPO

## 2018-06-29 VITALS — BP 110/57 | HR 63 | Temp 98.5°F | Resp 18 | Wt 153.4 lb

## 2018-06-29 DIAGNOSIS — C770 Secondary and unspecified malignant neoplasm of lymph nodes of head, face and neck: Secondary | ICD-10-CM | POA: Diagnosis not present

## 2018-06-29 DIAGNOSIS — C3490 Malignant neoplasm of unspecified part of unspecified bronchus or lung: Secondary | ICD-10-CM | POA: Diagnosis not present

## 2018-06-29 DIAGNOSIS — Z5111 Encounter for antineoplastic chemotherapy: Secondary | ICD-10-CM | POA: Diagnosis not present

## 2018-06-29 DIAGNOSIS — C787 Secondary malignant neoplasm of liver and intrahepatic bile duct: Secondary | ICD-10-CM | POA: Diagnosis not present

## 2018-06-29 MED ORDER — TOPOTECAN HCL CHEMO INJECTION 4 MG
1.2000 mg/m2 | Freq: Once | INTRAVENOUS | Status: AC
  Administered 2018-06-29: 2.2 mg via INTRAVENOUS
  Filled 2018-06-29: qty 2.2

## 2018-06-29 MED ORDER — SODIUM CHLORIDE 0.9% FLUSH
10.0000 mL | INTRAVENOUS | Status: DC | PRN
  Administered 2018-06-29: 10 mL
  Filled 2018-06-29: qty 10

## 2018-06-29 MED ORDER — SODIUM CHLORIDE 0.9 % IV SOLN
Freq: Once | INTRAVENOUS | Status: AC
  Administered 2018-06-29: 14:00:00 via INTRAVENOUS
  Filled 2018-06-29: qty 2

## 2018-06-29 MED ORDER — SODIUM CHLORIDE 0.9 % IV SOLN
Freq: Once | INTRAVENOUS | Status: AC
  Administered 2018-06-29: 13:00:00 via INTRAVENOUS

## 2018-06-29 NOTE — Progress Notes (Signed)
Patient tolerated treatment with no complaints voiced.  Port site clean and dry with no bruising or swelling noted at site.  Dressing intact.  VSS with discharge and left ambulatory with no s/s of distress noted.

## 2018-06-29 NOTE — Patient Instructions (Signed)
[facility] Discharge Instructions for Patients Receiving Chemotherapy  Today you received the following chemotherapy agents vidaza.   If you develop nausea and vomiting that is not controlled by your nausea medication, call the clinic.   BELOW ARE SYMPTOMS THAT SHOULD BE REPORTED IMMEDIATELY:  *FEVER GREATER THAN 100.5 F  *CHILLS WITH OR WITHOUT FEVER  NAUSEA AND VOMITING THAT IS NOT CONTROLLED WITH YOUR NAUSEA MEDICATION  *UNUSUAL SHORTNESS OF BREATH  *UNUSUAL BRUISING OR BLEEDING  TENDERNESS IN MOUTH AND THROAT WITH OR WITHOUT PRESENCE OF ULCERS  *URINARY PROBLEMS  *BOWEL PROBLEMS  UNUSUAL RASH Items with * indicate a potential emergency and should be followed up as soon as possible.  Feel free to call the clinic should you have any questions or concerns. The clinic phone number is [phone].  Please show the [place] at check-in to the Emergency Department and triage nurse.

## 2018-06-30 ENCOUNTER — Encounter (HOSPITAL_COMMUNITY): Payer: Self-pay

## 2018-06-30 ENCOUNTER — Inpatient Hospital Stay (HOSPITAL_COMMUNITY): Payer: Medicare PPO

## 2018-06-30 VITALS — BP 121/55 | HR 64 | Temp 98.1°F | Resp 18

## 2018-06-30 DIAGNOSIS — Z5111 Encounter for antineoplastic chemotherapy: Secondary | ICD-10-CM | POA: Diagnosis not present

## 2018-06-30 DIAGNOSIS — C770 Secondary and unspecified malignant neoplasm of lymph nodes of head, face and neck: Secondary | ICD-10-CM | POA: Diagnosis not present

## 2018-06-30 DIAGNOSIS — C3490 Malignant neoplasm of unspecified part of unspecified bronchus or lung: Secondary | ICD-10-CM

## 2018-06-30 DIAGNOSIS — C787 Secondary malignant neoplasm of liver and intrahepatic bile duct: Secondary | ICD-10-CM | POA: Diagnosis not present

## 2018-06-30 MED ORDER — SODIUM CHLORIDE 0.9 % IV SOLN
Freq: Once | INTRAVENOUS | Status: AC
  Administered 2018-06-30: 14:00:00 via INTRAVENOUS
  Filled 2018-06-30: qty 2

## 2018-06-30 MED ORDER — SODIUM CHLORIDE 0.9% FLUSH
10.0000 mL | INTRAVENOUS | Status: DC | PRN
  Administered 2018-06-30: 10 mL
  Filled 2018-06-30: qty 10

## 2018-06-30 MED ORDER — TOPOTECAN HCL CHEMO INJECTION 4 MG
1.2000 mg/m2 | Freq: Once | INTRAVENOUS | Status: AC
  Administered 2018-06-30: 2.2 mg via INTRAVENOUS
  Filled 2018-06-30: qty 2.2

## 2018-06-30 MED ORDER — SODIUM CHLORIDE 0.9 % IV SOLN
Freq: Once | INTRAVENOUS | Status: AC
  Administered 2018-06-30: 13:00:00 via INTRAVENOUS

## 2018-06-30 NOTE — Patient Instructions (Signed)
[facility] Discharge Instructions for Patients Receiving Chemotherapy   Beginning January 23rd 2017 lab work for the [facility] will be done in the  Main lab at [facility] on 1st floor. If you have a lab appointment with the [facility] please come in thru the  Main Entrance and check in at the main information desk   Today you received the following chemotherapy agents Topotecan. Follow-up as scheduled. Call clinic for any questions or concerns  To help prevent nausea and vomiting after your treatment, we encourage you to take your nausea medication   If you develop nausea and vomiting, or diarrhea that is not controlled by your medication, call the clinic.  The clinic phone number is [phone]. Office hours are Monday-Friday 8:30am-5:00pm.  BELOW ARE SYMPTOMS THAT SHOULD BE REPORTED IMMEDIATELY:  *FEVER GREATER THAN 101.0 F  *CHILLS WITH OR WITHOUT FEVER  NAUSEA AND VOMITING THAT IS NOT CONTROLLED WITH YOUR NAUSEA MEDICATION  *UNUSUAL SHORTNESS OF BREATH  *UNUSUAL BRUISING OR BLEEDING  TENDERNESS IN MOUTH AND THROAT WITH OR WITHOUT PRESENCE OF ULCERS  *URINARY PROBLEMS  *BOWEL PROBLEMS  UNUSUAL RASH Items with * indicate a potential emergency and should be followed up as soon as possible. If you have an emergency after office hours please contact your primary care physician or go to the nearest emergency department.  Please call the clinic during office hours if you have any questions or concerns.   You may also contact the Patient Navigator at [phone] should you have any questions or need assistance in obtaining follow up care.      Resources For Cancer Patients and their Caregivers ? American Cancer Society: Can assist with transportation, wigs, general needs, runs Look Good Feel Better.        317-045-9847 ? Cancer Care: Provides financial assistance, online support groups, medication/co-pay assistance.  1-800-813-HOPE  307-521-7538) ? [facility] Assists [place] cancer patients and their families through emotional , educational and financial support.  (717) 858-9281 ? [place] DSS Where to apply for food stamps, Medicaid and utility assistance. 212-527-1458 ? RCATS: Transportation to medical appointments. 310-067-2112 ? Social Security Administration: May apply for disability if have a Stage IV cancer. 906-620-5677 617-274-2047 ? [employer], Disability and Transit Services: Assists with nutrition, care and transit needs. 757-451-2395

## 2018-06-30 NOTE — Progress Notes (Signed)
[name] tolerated Topotecan infusion well without complaints or incident. Port left accessed for use tomorrow. VSS upon discharge. Pt discharged self ambulatory in satisfactory condition

## 2018-07-01 ENCOUNTER — Inpatient Hospital Stay (HOSPITAL_COMMUNITY): Payer: Medicare PPO

## 2018-07-01 VITALS — BP 129/57 | HR 59 | Temp 97.8°F | Resp 18

## 2018-07-01 DIAGNOSIS — Z5111 Encounter for antineoplastic chemotherapy: Secondary | ICD-10-CM | POA: Diagnosis not present

## 2018-07-01 DIAGNOSIS — C770 Secondary and unspecified malignant neoplasm of lymph nodes of head, face and neck: Secondary | ICD-10-CM | POA: Diagnosis not present

## 2018-07-01 DIAGNOSIS — C787 Secondary malignant neoplasm of liver and intrahepatic bile duct: Secondary | ICD-10-CM | POA: Diagnosis not present

## 2018-07-01 DIAGNOSIS — C3490 Malignant neoplasm of unspecified part of unspecified bronchus or lung: Secondary | ICD-10-CM

## 2018-07-01 MED ORDER — SODIUM CHLORIDE 0.9 % IV SOLN
Freq: Once | INTRAVENOUS | Status: AC
  Administered 2018-07-01: 13:00:00 via INTRAVENOUS

## 2018-07-01 MED ORDER — SODIUM CHLORIDE 0.9% FLUSH
10.0000 mL | INTRAVENOUS | Status: DC | PRN
  Administered 2018-07-01: 10 mL
  Filled 2018-07-01: qty 10

## 2018-07-01 MED ORDER — TOPOTECAN HCL CHEMO INJECTION 4 MG
1.2000 mg/m2 | Freq: Once | INTRAVENOUS | Status: AC
  Administered 2018-07-01: 2.2 mg via INTRAVENOUS
  Filled 2018-07-01: qty 2.2

## 2018-07-01 MED ORDER — SODIUM CHLORIDE 0.9 % IV SOLN
Freq: Once | INTRAVENOUS | Status: AC
  Administered 2018-07-01: 14:00:00 via INTRAVENOUS
  Filled 2018-07-01: qty 2

## 2018-07-01 NOTE — Progress Notes (Signed)
Harvie Junior tolerated Topetecan without incident or complaint. VSS. Discharged self ambulatory in satisfactory condition.

## 2018-07-01 NOTE — Patient Instructions (Signed)
[facility] Discharge Instructions for Patients Receiving Chemotherapy   Beginning January 23rd 2017 lab work for the [facility] will be done in the  Main lab at [facility] on 1st floor. If you have a lab appointment with the [place] please come in thru the  Main Entrance and check in at the main information desk   Today you received the following chemotherapy agents Topotecan  To help prevent nausea and vomiting after your treatment, we encourage you to take your nausea medication If you develop nausea and vomiting, or diarrhea that is not controlled by your medication, call the clinic.  The clinic phone number is [phone]. Office hours are Monday-Friday 8:30am-5:00pm.  BELOW ARE SYMPTOMS THAT SHOULD BE REPORTED IMMEDIATELY:  *FEVER GREATER THAN 101.0 F  *CHILLS WITH OR WITHOUT FEVER  NAUSEA AND VOMITING THAT IS NOT CONTROLLED WITH YOUR NAUSEA MEDICATION  *UNUSUAL SHORTNESS OF BREATH  *UNUSUAL BRUISING OR BLEEDING  TENDERNESS IN MOUTH AND THROAT WITH OR WITHOUT PRESENCE OF ULCERS  *URINARY PROBLEMS  *BOWEL PROBLEMS  UNUSUAL RASH Items with * indicate a potential emergency and should be followed up as soon as possible. If you have an emergency after office hours please contact your primary care physician or go to the nearest emergency department.  Please call the clinic during office hours if you have any questions or concerns.   You may also contact the Patient Navigator at [phone] should you have any questions or need assistance in obtaining follow up care.      Resources For Cancer Patients and their Caregivers ? American Cancer Society: Can assist with transportation, wigs, general needs, runs Look Good Feel Better.        615-021-7499 ? Cancer Care: Provides financial assistance, online support groups, medication/co-pay assistance.  1-800-813-HOPE 920-060-4128) ? [place] Assists [place] cancer  patients and their families through emotional , educational and financial support.  (747)711-4757 ? [place] DSS Where to apply for food stamps, Medicaid and utility assistance. 336-204-7900 ? [employer]: Transportation to medical appointments. 786-412-8315 ? Social Security Administration: May apply for disability if have a Stage IV cancer. (386)580-4224 873-712-2834 ? [employer], Disability and Transit Services: Assists with nutrition, care and transit needs. (818)448-6548

## 2018-07-02 ENCOUNTER — Encounter (HOSPITAL_COMMUNITY): Payer: Self-pay

## 2018-07-02 ENCOUNTER — Inpatient Hospital Stay (HOSPITAL_COMMUNITY): Payer: Medicare PPO | Attending: Hematology

## 2018-07-02 ENCOUNTER — Inpatient Hospital Stay (HOSPITAL_COMMUNITY): Payer: Medicare PPO

## 2018-07-02 VITALS — BP 122/56 | HR 59 | Temp 97.9°F | Resp 18

## 2018-07-02 DIAGNOSIS — C770 Secondary and unspecified malignant neoplasm of lymph nodes of head, face and neck: Secondary | ICD-10-CM | POA: Insufficient documentation

## 2018-07-02 DIAGNOSIS — C787 Secondary malignant neoplasm of liver and intrahepatic bile duct: Secondary | ICD-10-CM | POA: Insufficient documentation

## 2018-07-02 DIAGNOSIS — Z5111 Encounter for antineoplastic chemotherapy: Secondary | ICD-10-CM | POA: Diagnosis not present

## 2018-07-02 DIAGNOSIS — C3490 Malignant neoplasm of unspecified part of unspecified bronchus or lung: Secondary | ICD-10-CM | POA: Insufficient documentation

## 2018-07-02 MED ORDER — SODIUM CHLORIDE 0.9 % IV SOLN
Freq: Once | INTRAVENOUS | Status: AC
  Administered 2018-07-02: 14:00:00 via INTRAVENOUS

## 2018-07-02 MED ORDER — SODIUM CHLORIDE 0.9% FLUSH: 10.0000 mL | INTRAVENOUS | Status: DC | PRN

## 2018-07-02 MED ORDER — PEGFILGRASTIM 6 MG/0.6ML ~~LOC~~ PSKT
6.0000 mg | PREFILLED_SYRINGE | Freq: Once | SUBCUTANEOUS | Status: AC
  Administered 2018-07-02: 6 mg via SUBCUTANEOUS
  Filled 2018-07-02: qty 0.6

## 2018-07-02 MED ORDER — TOPOTECAN HCL CHEMO INJECTION 4 MG
1.2000 mg/m2 | Freq: Once | INTRAVENOUS | Status: AC
  Administered 2018-07-02: 2.2 mg via INTRAVENOUS
  Filled 2018-07-02: qty 2.2

## 2018-07-02 MED ORDER — HEPARIN SOD (PORK) LOCK FLUSH 100 UNIT/ML IV SOLN
500.0000 [IU] | Freq: Once | INTRAVENOUS | Status: DC | PRN
  Filled 2018-07-02: qty 5

## 2018-07-02 MED ORDER — SODIUM CHLORIDE 0.9 % IV SOLN
INTRAVENOUS | Status: AC
  Administered 2018-07-02: 14:00:00 via INTRAVENOUS

## 2018-07-02 MED ORDER — SODIUM CHLORIDE 0.9 % IV SOLN
Freq: Once | INTRAVENOUS | Status: AC
  Administered 2018-07-02: 14:00:00 via INTRAVENOUS
  Filled 2018-07-02: qty 2

## 2018-07-02 NOTE — Patient Instructions (Signed)
[facility] Discharge Instructions for Patients Receiving Chemotherapy  Today you received the following chemotherapy agents   To help prevent nausea and vomiting after your treatment, we encourage you to take your nausea medication   If you develop nausea and vomiting that is not controlled by your nausea medication, call the clinic.   BELOW ARE SYMPTOMS THAT SHOULD BE REPORTED IMMEDIATELY:  *FEVER GREATER THAN 100.5 F  *CHILLS WITH OR WITHOUT FEVER  NAUSEA AND VOMITING THAT IS NOT CONTROLLED WITH YOUR NAUSEA MEDICATION  *UNUSUAL SHORTNESS OF BREATH  *UNUSUAL BRUISING OR BLEEDING  TENDERNESS IN MOUTH AND THROAT WITH OR WITHOUT PRESENCE OF ULCERS  *URINARY PROBLEMS  *BOWEL PROBLEMS  UNUSUAL RASH Items with * indicate a potential emergency and should be followed up as soon as possible.  Feel free to call the clinic should you have any questions or concerns. The clinic phone number is [phone].  Please show the CHEMO ALERT CARD at check-in to the Emergency Department and triage nurse.   

## 2018-07-02 NOTE — Progress Notes (Signed)
Nutrition Follow-up:  Patient with lung cancer with liver mets.   Patient receiving chemotherapy  Met with patient during infusion.  Patient reports fairly good appetite. "I don't eat as much as I use to." Has been drinking ensure, making smoothies and trying to eat good sources of protein. Concerned about cholesterol in foods today.   Medications: reviewed   Labs: Noted lipid panel drawn on 11/19.  Anthropometrics:   Weight 153 lb 6.4 oz today increased from weight on 148 lb on 11/22  NUTRITION DIAGNOSIS: malnutrition stable   MALNUTRITION DIAGNOSIS: severe malnutrition improved with weight gain   INTERVENTION: Reviewed good sources of protein with patient.  Encouraged oral nutrition supplements for additional calories and protein. Discussed ways to add heart healthy calories (ie nuts, nut butters, olive oil, canola oil).  Stressed to patient importance of keeping calories increased to prevent weight from decreasing.       MONITORING, EVALUATION, GOAL: weight trends, intake   NEXT VISIT: as needed  Joli B. Zenia Resides, [place], [place] Registered Dietitian [phone] (pager)

## 2018-07-02 NOTE — Progress Notes (Signed)
Pt presents today for Topotecan and Neulasta On Pro. VSS. Pt has complaints of dizziness and unsteady on his feet. Dr. Delton Coombes informed, reported pt's complaints of dizziness and ambulation unsteady. Labs reviewed, medication's reviewed. VO received to give 595ml bolus of NS with chemo today.   Treatment given today per MD orders. Tolerated infusion without adverse affects. Vital signs stable. No complaints at this time. Discharged from clinic ambulatory. F/U with [facility] as scheduled. Neulasta On Pro pump in place per protocol.

## 2018-07-26 ENCOUNTER — Other Ambulatory Visit: Payer: Self-pay

## 2018-07-26 ENCOUNTER — Encounter (HOSPITAL_COMMUNITY): Payer: Self-pay | Admitting: Hematology

## 2018-07-26 ENCOUNTER — Inpatient Hospital Stay (HOSPITAL_BASED_OUTPATIENT_CLINIC_OR_DEPARTMENT_OTHER): Payer: Medicare PPO | Admitting: Hematology

## 2018-07-26 ENCOUNTER — Encounter (HOSPITAL_COMMUNITY): Payer: Self-pay

## 2018-07-26 ENCOUNTER — Inpatient Hospital Stay (HOSPITAL_COMMUNITY): Payer: Medicare PPO

## 2018-07-26 ENCOUNTER — Inpatient Hospital Stay (HOSPITAL_COMMUNITY): Payer: Medicare PPO | Attending: Hematology

## 2018-07-26 VITALS — BP 128/55 | HR 60 | Temp 98.1°F | Resp 16 | Wt 154.4 lb

## 2018-07-26 VITALS — BP 109/47 | HR 64 | Temp 98.1°F | Resp 18 | Wt 154.4 lb

## 2018-07-26 DIAGNOSIS — C3490 Malignant neoplasm of unspecified part of unspecified bronchus or lung: Secondary | ICD-10-CM

## 2018-07-26 DIAGNOSIS — G620 Drug-induced polyneuropathy: Secondary | ICD-10-CM | POA: Diagnosis not present

## 2018-07-26 DIAGNOSIS — C787 Secondary malignant neoplasm of liver and intrahepatic bile duct: Secondary | ICD-10-CM | POA: Insufficient documentation

## 2018-07-26 DIAGNOSIS — F17210 Nicotine dependence, cigarettes, uncomplicated: Secondary | ICD-10-CM | POA: Diagnosis not present

## 2018-07-26 DIAGNOSIS — G893 Neoplasm related pain (acute) (chronic): Secondary | ICD-10-CM | POA: Diagnosis not present

## 2018-07-26 DIAGNOSIS — C772 Secondary and unspecified malignant neoplasm of intra-abdominal lymph nodes: Secondary | ICD-10-CM | POA: Diagnosis not present

## 2018-07-26 DIAGNOSIS — Z5111 Encounter for antineoplastic chemotherapy: Secondary | ICD-10-CM | POA: Diagnosis not present

## 2018-07-26 DIAGNOSIS — D6481 Anemia due to antineoplastic chemotherapy: Secondary | ICD-10-CM

## 2018-07-26 DIAGNOSIS — T451X5A Adverse effect of antineoplastic and immunosuppressive drugs, initial encounter: Secondary | ICD-10-CM | POA: Diagnosis not present

## 2018-07-26 LAB — CBC WITH DIFFERENTIAL/PLATELET
Abs Immature Granulocytes: 0.04 10*3/uL (ref 0.00–0.07)
Basophils Absolute: 0 10*3/uL (ref 0.0–0.1)
Basophils Relative: 0 %
Eosinophils Absolute: 0.1 10*3/uL (ref 0.0–0.5)
Eosinophils Relative: 1 %
HCT: 29.8 % — ABNORMAL LOW (ref 39.0–52.0)
Hemoglobin: 9 g/dL — ABNORMAL LOW (ref 13.0–17.0)
Immature Granulocytes: 1 %
Lymphocytes Relative: 17 %
Lymphs Abs: 1.2 10*3/uL (ref 0.7–4.0)
MCH: 30.5 pg (ref 26.0–34.0)
MCHC: 30.2 g/dL (ref 30.0–36.0)
MCV: 101 fL — ABNORMAL HIGH (ref 80.0–100.0)
Monocytes Absolute: 0.8 10*3/uL (ref 0.1–1.0)
Monocytes Relative: 12 %
Neutro Abs: 4.6 10*3/uL (ref 1.7–7.7)
Neutrophils Relative %: 69 %
Platelets: 332 10*3/uL (ref 150–400)
RBC: 2.95 MIL/uL — ABNORMAL LOW (ref 4.22–5.81)
RDW: 22.3 % — ABNORMAL HIGH (ref 11.5–15.5)
WBC: 6.7 10*3/uL (ref 4.0–10.5)
nRBC: 0 % (ref 0.0–0.2)

## 2018-07-26 LAB — COMPREHENSIVE METABOLIC PANEL
ALT: 12 U/L (ref 0–44)
AST: 15 U/L (ref 15–41)
Albumin: 3.7 g/dL (ref 3.5–5.0)
Alkaline Phosphatase: 59 U/L (ref 38–126)
Anion gap: 5 (ref 5–15)
BUN: 19 mg/dL (ref 8–23)
CO2: 25 mmol/L (ref 22–32)
Calcium: 8.7 mg/dL — ABNORMAL LOW (ref 8.9–10.3)
Chloride: 106 mmol/L (ref 98–111)
Creatinine, Ser: 0.78 mg/dL (ref 0.61–1.24)
GFR calc Af Amer: >60 mL/min (ref >60)
GFR calc non Af Amer: >60 mL/min (ref >60)
Glucose, Bld: 103 mg/dL — ABNORMAL HIGH (ref 70–99)
Potassium: 4 mmol/L (ref 3.5–5.1)
Sodium: 136 mmol/L (ref 135–145)
Total Bilirubin: 0.4 mg/dL (ref 0.3–1.2)
Total Protein: 6.6 g/dL (ref 6.5–8.1)

## 2018-07-26 MED ORDER — TOPOTECAN HCL CHEMO INJECTION 4 MG
1.2000 mg/m2 | Freq: Once | INTRAVENOUS | Status: AC
  Administered 2018-07-26: 2.2 mg via INTRAVENOUS
  Filled 2018-07-26: qty 2.2

## 2018-07-26 MED ORDER — HEPARIN SOD (PORK) LOCK FLUSH 100 UNIT/ML IV SOLN
500.0000 [IU] | Freq: Once | INTRAVENOUS | Status: AC
  Administered 2018-07-26: 500 [IU] via INTRAVENOUS

## 2018-07-26 MED ORDER — SODIUM CHLORIDE 0.9 % IV SOLN
Freq: Once | INTRAVENOUS | Status: AC
  Administered 2018-07-26: 11:00:00 via INTRAVENOUS

## 2018-07-26 MED ORDER — SODIUM CHLORIDE 0.9 % IV SOLN
Freq: Once | INTRAVENOUS | Status: AC
  Administered 2018-07-26: 11:00:00 via INTRAVENOUS
  Filled 2018-07-26: qty 2

## 2018-07-26 NOTE — Patient Instructions (Signed)
[place] Discharge Instructions for Patients Receiving Chemotherapy  Today you received the following chemotherapy agents   To help prevent nausea and vomiting after your treatment, we encourage you to take your nausea medication    If you develop nausea and vomiting that is not controlled by your nausea medication, call the clinic.   BELOW ARE SYMPTOMS THAT SHOULD BE REPORTED IMMEDIATELY:  *FEVER GREATER THAN 100.5 F  *CHILLS WITH OR WITHOUT FEVER  NAUSEA AND VOMITING THAT IS NOT CONTROLLED WITH YOUR NAUSEA MEDICATION  *UNUSUAL SHORTNESS OF BREATH  *UNUSUAL BRUISING OR BLEEDING  TENDERNESS IN MOUTH AND THROAT WITH OR WITHOUT PRESENCE OF ULCERS  *URINARY PROBLEMS  *BOWEL PROBLEMS  UNUSUAL RASH Items with * indicate a potential emergency and should be followed up as soon as possible.  Feel free to call the clinic should you have any questions or concerns. The clinic phone number is [phone].  Please show the [place] at check-in to the Emergency Department and triage nurse.  [place] [facility]  Discharge Instructions:  _______________________________________________________________  Thank you for choosing [place] at [facility] to provide your oncology and hematology care.  To afford each patient quality time with our providers, please arrive at least 15 minutes before your scheduled appointment.  You need to re-schedule your appointment if you arrive 10 or more minutes late.  We strive to give you quality time with our providers, and arriving late affects you and other patients whose appointments are after yours.  Also, if you no show three or more times for appointments you may be dismissed from the clinic.  Again, thank you for choosing [place] at [place] hope is that these requests will allow you access to exceptional care and in a timely  manner. _______________________________________________________________  If you have questions after your visit, please contact our office at [phone] between the hours of 8:30 a.m. and 5:00 p.m. Voicemails left after 4:30 p.m. will not be returned until the following business day. _______________________________________________________________  For prescription refill requests, have your pharmacy contact our office. _______________________________________________________________  Recommendations made by the consultant and any test results will be sent to your referring physician. _______________________________________________________________

## 2018-07-26 NOTE — Progress Notes (Signed)
Pt presents today for Topotecan Day 1-5 Q 28 days. VSS. No complaints of any changes since the last visit. MAR reviewed and updated with patient.   Labs reviewed by Dr. Delton Coombes and VO received to proceed with treatment.   .Treatment given today per MD orders. Tolerated infusion without adverse affects. Vital signs stable. No complaints at this time. Discharged from clinic ambulatory. F/U with [facility] as scheduled.

## 2018-07-26 NOTE — Progress Notes (Signed)
[name], [place] [id number]   CLINIC:  Medical Oncology/Hematology  PCP:  [name], MD [address] [facility] [place] [address] [phone]   REASON FOR VISIT: Follow-up for extensive stage small cell lung cancer.   CURRENT THERAPY: Topotecan every 4 weeks.  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 Bulky 5.4 cm right supraclavicular region malignant lymph node conglomeration with extracapsular extension. 2. Surrounding smaller abnormal right level 3 and level 5 lymph nodes, and the lymphadenopathy continues into the superior mediastinum, see Chest CT findings reported separately. 3. No other metastatic disease identified in the neck.    01/16/2017 Imaging    CT chest: IMPRESSION: 1. Extensive lymphadenopathy in the thorax and lower right cervical region, as discussed above. Primary differential considerations include lymphoma/leukemia or small cell carcinoma of the lung. Further evaluation a PET-CT could be considered to assess for additional sites of disease below the diaphragm if clinically appropriate. Additionally, ultrasound-guided biopsy of supraclavicular lymphadenopathy could be considered to establish a tissue diagnosis. 2. Indeterminate lesion in the periphery of segment 8 of the liver measuring 2.7 x 1.7 cm. Attention at time of follow-up PET-CT is recommended. 3. Aortic atherosclerosis, in addition to left main and 3 vessel coronary artery disease. Please note that although the presence of coronary artery calcium documents the presence of coronary artery disease, the severity of this disease and any potential stenosis cannot be assessed on this non-gated CT examination. Assessment for potential risk factor modification, dietary therapy or pharmacologic therapy may be warranted, if clinically indicated. 4. There are  calcifications of the aortic valve. Echocardiographic correlation for evaluation of potential valvular dysfunction may be warranted if clinically indicated. 5. Diffuse bronchial wall thickening with moderate centrilobular and paraseptal emphysema; imaging findings suggestive of underlying COPD.    02/03/2017 Initial Biopsy    (R) neck lymph node biopsy: SMALL CELL CARCINOMA (most likely lung primary).     02/03/2017 Miscellaneous    Port-a-cath attempted by IR; unable to place d/t enlarged SVC.     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 Staging for metastatic disease  EXAM: MRI HEAD WITHOUT AND WITH CONTRAST  TECHNIQUE: Multiplanar, multiecho pulse sequences of the brain and surrounding structures were obtained without and with intravenous contrast.  CONTRAST:  22m MULTIHANCE GADOBENATE DIMEGLUMINE 529 MG/ML IV SOLN  COMPARISON:  None.  FINDINGS: Brain: Negative for hydrocephalus. Cerebral volume normal for age. Small nonenhancing white matter hyperintensities consistent with mild chronic microvascular ischemia. No acute infarct. Negative for hemorrhage or mass or edema  Normal enhancement postcontrast infusion. No enhancing mass lesion. Leptomeningeal enhancement is normal.  Vascular: Normal arterial flow voids.  Normal venous enhancement  Skull and upper cervical spine: Negative  Sinuses/Orbits: Negative  Other: None  IMPRESSION: Negative for metastatic disease.  No acute abnormality.  Mild chronic white matter changes.    04/07/2017 Imaging     PET:  1. Marked reduction in size and metabolic activity of bulky RIGHT supraclavicular adenopathy mediastinal lymphadenopathy. 2. Residual moderate activity remains within small  RIGHT supraclavicular lymph node, RIGHT lower paratracheal lymph node and RIGHT  hilar lymph node. 3. Resolution of prevascular and internal mammary mediastinal metastatic hypermetabolic activity. 4. Resolution of metabolic activity associated with solitary RIGHT hepatic lobe liver metastasis. 5. No evidence of disease progression. 6. 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    05/27/2017 Imaging    MRI brain w/ and w/o contrast IMPRESSION: 1. No metastatic disease identified. 2. Increased nonspecific cerebral white matter signal changes since August. These are most commonly small vessel disease related. 3. New right maxillary sinusitis. Benign appearing retention cysts in the nasopharynx with trace mastoid effusions.    06/12/2017 Imaging    PET-CT IMPRESSION: 1. There are two new hypermetabolic nodules identified within both lower lobes measuring up to 3.1 cm. The appearance is nonspecific and may be inflammatory/infectious in etiology. Pulmonary metastatic disease cannot be excluded and short-term follow-up imaging in 3 months is advised to reassess these nodules. 2. Stable appearance of mild hypermetabolic activity associated with right paratracheal and right hilar lymph nodes. 3. Decrease in FDG uptake associated with index right supraclavicular lymph node. 4. 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 5. Aortic Atherosclerosis (ICD10-I70.0) and Emphysema (ICD10-J43.9).    08/24/2017 -  Chemotherapy    The patient had pegfilgrastim (NEULASTA ONPRO KIT) injection 6 mg, 6 mg, Subcutaneous, Once, 14 of 14 cycles Administration: 6 mg (08/28/2017), 6 mg (09/18/2017), 6 mg (10/09/2017), 6 mg (11/02/2017), 6 mg (11/20/2017), 6 mg (12/18/2017), 6 mg (01/15/2018), 6 mg (02/12/2018), 6 mg (03/12/2018), 6 mg  (04/09/2018), 6 mg (05/07/2018), 6 mg (06/04/2018), 6 mg (07/02/2018) topotecan (HYCAMTIN) 2.9 mg in sodium chloride 0.9 % 100 mL chemo infusion, 1.5 mg/m2 = 2.9 mg, Intravenous,  Once, 14 of 14 cycles Dose modification: 1.2 mg/m2 (80 % of original dose 1.5 mg/m2, Cycle 4, Reason: Dose Not Tolerated) Administration: 2.9 mg (08/24/2017), 2.9 mg (08/25/2017), 2.9 mg (08/28/2017), 2.9 mg (08/26/2017), 2.9 mg (08/27/2017), 2.9 mg (09/14/2017), 2.9 mg (09/15/2017), 2.9 mg (09/16/2017), 2.9 mg (09/17/2017), 2.9 mg (09/18/2017), 2.9 mg (10/05/2017), 2.9 mg (10/06/2017), 2.9 mg (10/07/2017), 2.9 mg (10/08/2017), 2.9 mg (10/09/2017), 2.3 mg (10/26/2017), 2.3 mg (10/27/2017), 2.3 mg (10/28/2017), 2.3 mg (10/29/2017), 2.3 mg (11/02/2017), 2.3 mg (11/16/2017), 2.3 mg (11/17/2017), 2.3 mg (11/18/2017), 2.3 mg (11/19/2017), 2.3 mg (11/20/2017), 2.3 mg (12/14/2017), 2.3 mg (12/15/2017), 2.3 mg (12/16/2017), 2.3 mg (12/17/2017), 2.3 mg (12/18/2017), 2.3 mg (01/11/2018), 2.3 mg (01/12/2018), 2.3 mg (01/13/2018), 2.3 mg (01/15/2018), 2.3 mg (02/08/2018), 2.3 mg (02/09/2018), 2.3 mg (02/10/2018), 2.3 mg (02/11/2018), 2.3 mg (02/12/2018), 2.3 mg (03/08/2018), 2.3 mg (03/09/2018), 2.3 mg (03/10/2018), 2.3 mg (03/11/2018), 2.3 mg (03/12/2018), 2.2 mg (04/05/2018), 2.2 mg (04/06/2018), 2.2 mg (04/07/2018), 2.2 mg (04/08/2018), 2.2 mg (04/09/2018), 2.2 mg (05/03/2018), 2.2 mg (05/04/2018), 2.2 mg (05/05/2018), 2.2 mg (05/06/2018), 2.2 mg (05/07/2018), 2.2 mg (05/31/2018), 2.2 mg (06/01/2018), 2.2 mg (06/02/2018), 2.2 mg (06/03/2018), 2.2 mg (06/04/2018), 2.2 mg (06/28/2018), 2.2 mg (06/29/2018), 2.2 mg (06/30/2018), 2.2 mg (07/01/2018), 2.2 mg (07/02/2018), 2.2 mg (07/26/2018) ondansetron (ZOFRAN) 4 mg in sodium chloride 0.9 % 50 mL IVPB, , Intravenous,  Once, 9 of 9 cycles Administration:  (02/08/2018),  (02/09/2018),  (02/10/2018),  (02/11/2018),  (02/12/2018),  (03/08/2018),  (03/09/2018),  (03/10/2018),  (03/11/2018),  (03/12/2018),  (04/05/2018),  (04/06/2018),  (04/07/2018),  (04/08/2018),  (04/09/2018),   (05/03/2018),  (05/04/2018),  (05/05/2018),  (05/06/2018),  (05/07/2018),  (05/31/2018),  (06/01/2018),  (06/02/2018),  (06/03/2018),  (06/04/2018),  (06/28/2018),  (  06/29/2018),  (06/30/2018),  (07/01/2018),  (07/02/2018),  (07/26/2018)  for chemotherapy treatment.      INTERVAL HISTORY:  Mr. Quam 68 y.o. male returns for routine follow-up for extensive stage small cell lung cancer. He is here and doing well today. He is tolerating treatment well. He does experience fatigue and dizziness during the day. Denies any nausea, vomiting, or diarrhea. Denies any new pains. Had not noticed any recent bleeding such as epistaxis, hematuria or hematochezia. Denies recent chest pain on exertion, shortness of breath on minimal exertion, pre-syncopal episodes, or palpitations. Denies any numbness or tingling in hands or feet. Denies any recent fevers, infections, or recent hospitalizations. He reports his appetite and energy level at 50%.     REVIEW OF SYSTEMS:  Review of Systems  Constitutional: Positive for fatigue.  Neurological: Positive for dizziness.  All other systems reviewed and are negative.    PAST MEDICAL/SURGICAL HISTORY:  Past Medical History:  Diagnosis Date  . Anxiety   . CAD (coronary artery disease)   . Cancer ([place])    stage 4 small cell lung cancer   . COPD (chronic obstructive pulmonary disease) ([place])   . Depression   . Dyspnea    increased exertion  . Feeling of chest tightness   . Heart palpitations   . History of chemotherapy   . Myocardial infarction ([place])   . Osteopenia   . Panic attacks   . Smoker    Past Surgical History:  Procedure Laterality Date  . BACK SURGERY  12/24/2000   L5,S1  . CORONARY STENT PLACEMENT  2005   RCA & CX  . HERNIA REPAIR Right 1980's  . INGUINAL HERNIA REPAIR  12/1978   right side  . IR FLUORO GUIDE PORT INSERTION RIGHT  04/02/2017  . IR US GUIDE BX ASP/DRAIN  02/03/2017  . IR US GUIDE VASC ACCESS RIGHT  04/02/2017  . NM MYOCAR PERF  WALL MOTION  09/07/2009   No ischemia; EF 51%  . SHOULDER SURGERY Left 08/2010  . SPINE SURGERY  2002   L5-S1     SOCIAL HISTORY:  Social History   Socioeconomic History  . Marital status: Single    Spouse name: Not on file  . Number of children: Not on file  . Years of education: Not on file  . Highest education level: Not on file  Occupational History  . Not on file  Social Needs  . Financial resource strain: Not on file  . Food insecurity:    Worry: Not on file    Inability: Not on file  . Transportation needs:    Medical: Not on file    Non-medical: Not on file  Tobacco Use  . Smoking status: Current Every Day Smoker    Packs/day: 1.00    Types: Cigarettes  . Smokeless tobacco: Never Used  Substance and Sexual Activity  . Alcohol use: Yes    Comment: occas  . Drug use: No  . Sexual activity: Not on file  Lifestyle  . Physical activity:    Days per week: Not on file    Minutes per session: Not on file  . Stress: Not on file  Relationships  . Social connections:    Talks on phone: Not on file    Gets together: Not on file    Attends religious service: Not on file    Active member of club or organization: Not on file    Attends meetings of clubs or organizations: Not on file  Relationship status: Not on file  . Intimate partner violence:    Fear of current or ex partner: Not on file    Emotionally abused: Not on file    Physically abused: Not on file    Forced sexual activity: Not on file  Other Topics Concern  . Not on file  Social History Narrative  . Not on file    FAMILY HISTORY:  Family History  Problem Relation Age of Onset  . Heart attack Father   . Kidney disease Father        renal failure  . Heart failure Mother   . Heart attack Mother   . Cancer Brother   . Diabetes Brother   . Alcohol abuse Brother   . Diabetes Sister     CURRENT MEDICATIONS:  Outpatient Encounter Medications as of 07/26/2018  Medication Sig  . ALPRAZolam  (XANAX) 1 MG tablet TAKE (1) TABLET BY MOUTH (4) TIMES DAILY.  Marland Kitchen atorvastatin (LIPITOR) 40 MG tablet Take 1 tablet (40 mg total) by mouth daily.  . clotrimazole-betamethasone (LOTRISONE) cream Apply 1 application topically 2 (two) times daily.  . cyclobenzaprine (FLEXERIL) 5 MG tablet TAKE 1 TABLET THREE TIMES DAILY AS NEEDED FOR MUSCLE SPASMS.  Marland Kitchen gabapentin (NEURONTIN) 300 MG capsule Take 1 capsule (300 mg total) by mouth 2 (two) times daily.  Marland Kitchen lidocaine-prilocaine (EMLA) cream Apply to affected area once  . ondansetron (ZOFRAN) 8 MG tablet Take 1 tablet (8 mg total) by mouth 2 (two) times daily as needed. Start on the third day after cisplatin chemotherapy.  Marland Kitchen oxyCODONE-acetaminophen (PERCOCET/ROXICET) 5-325 MG tablet Take 1 tablet by mouth every 8 (eight) hours as needed for severe pain.  Marland Kitchen Pegfilgrastim (NEULASTA ONPRO [place]) Inject into the skin. Every 21 days  . polyethylene glycol powder (GLYCOLAX/MIRALAX) powder Take 17 g by mouth daily.  . prochlorperazine (COMPAZINE) 10 MG tablet Take 1 tablet (10 mg total) by mouth every 6 (six) hours as needed (Nausea or vomiting).  . TOPOTECAN HCL IV Inject into the vein.  Marland Kitchen topotecan in sodium chloride 0.9 % 100 mL Inject into the vein once. Days 1-5 every 21 days   No facility-administered encounter medications on file as of 07/26/2018.     ALLERGIES:  Allergies  Allergen Reactions  . Codeine Nausea Only  . Niaspan [Niacin Er]      PHYSICAL EXAM:  ECOG Performance status: 1  Vitals:   07/26/18 0952  BP: (!) 109/47  Pulse: 64  Resp: 18  Temp: 98.1 F (36.7 C)  SpO2: 96%   Filed Weights   07/26/18 0952  Weight: 154 lb 6.4 oz (70 kg)    Physical Exam Constitutional:      Appearance: Normal appearance. He is normal weight.  Cardiovascular:     Rate and Rhythm: Normal rate and regular rhythm.     Heart sounds: Normal heart sounds.  Pulmonary:     Effort: Pulmonary effort is normal.     Breath sounds: Normal breath sounds.    Musculoskeletal: Normal range of motion.  Skin:    General: Skin is warm and dry.  Neurological:     General: No focal deficit present.     Mental Status: He is alert and oriented to person, place, and time. Mental status is at baseline.  Psychiatric:        Mood and Affect: Mood normal.        Behavior: Behavior normal.        Thought Content: Thought content normal.  Judgment: Judgment normal.      LABORATORY DATA:  I have reviewed the labs as listed.  CBC    Component Value Date/Time   WBC 6.7 07/26/2018 0921   RBC 2.95 (L) 07/26/2018 0921   HGB 9.0 (L) 07/26/2018 0921   HCT 29.8 (L) 07/26/2018 0921   PLT 332 07/26/2018 0921   MCV 101.0 (H) 07/26/2018 0921   MCH 30.5 07/26/2018 0921   MCHC 30.2 07/26/2018 0921   RDW 22.3 (H) 07/26/2018 0921   LYMPHSABS 1.2 07/26/2018 0921   MONOABS 0.8 07/26/2018 0921   EOSABS 0.1 07/26/2018 0921   BASOSABS 0.0 07/26/2018 0921   CMP Latest Ref Rng & Units 07/26/2018 06/28/2018 05/31/2018  Glucose 70 - 99 mg/dL 103(H) 104(H) 84  BUN 8 - 23 mg/dL _0 Creatinine 0.61 - 1.24 mg/dL 0.78 0.91 0.89  Sodium 135 - 145 mmol/L 136 137 137  Potassium 3.5 - 5.1 mmol/L 4.0 4.3 3.9  Chloride 98 - 111 mmol/L 106 106 107  CO2 22 - 32 mmol/L _1 Calcium 8.9 - 10.3 mg/dL 8.7(L) 8.6(L) 8.6(L)  Total Protein 6.5 - 8.1 g/dL 6.6 6.3(L) 7.0  Total Bilirubin 0.3 - 1.2 mg/dL 0.4 0.5 0.5  Alkaline Phos 38 - 126 U/L 59 53 59  AST 15 - 41 U/L _2 ALT 0 - 44 U/L _3 DIAGNOSTIC IMAGING:  I have independently reviewed the scans and discussed with the patient.   I have reviewed Francene Finders, NP's note and agree with the documentation.  I personally performed a face-to-face visit, made revisions and my assessment and plan is as follows.    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  Small cell lung cancer with liver metastasis: - Status post 6 cycles of cisplatin and VP-16  completed on 05/25/2017 - PET/CT scan on 08/13/2017 showing progression with bilateral lung nodules and mediastinal adenopathy -Topotecan (1.5 mg/m square) for 5 days every 21 days started on 08/24/2017, cycle 2 on 09/14/2017, cycle 3 on 10/05/2017 -Last MRI of the brain done in [place] did not show any evidence of metastatic disease. - Therapeutic and dose was reduced to 1.2 mg/m during cycle 4 and cycle duration changed to every 4 weeks during cycle 5.  - CT scan on 01/06/2018 after 6 cycles of chemotherapy showed slight improvement in the lung lesions.  It can be considered more or less stable disease.  No new areas were seen. - CT CAP on 05/16/2018 after 10 cycles showed previously identified right-sided pulmonary nodules stable, no new or progressive findings in the malignancy in the chest, abdomen and pelvis. - We reviewed his blood work today.  There is adequate to proceed without any dose modifications. - We plan to repeat CT scans in 3 to 4 months from the last scan.  If he continues to do well, will wait until 4 months.  2.  Normocytic anemia: -This is chemotherapy-induced.  Last Feraheme infusion was on 10/29/2017.    3.  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   4. Cancer/chemotherapy related pains: -He is taking 1 Percocet 5 mg tablet in the mornings for his low back pain which is helping.       Orders placed this encounter:  Orders Placed This Encounter  Procedures  . Lactate dehydrogenase  . CBC with  Differential/Platelet  . Comprehensive metabolic panel      Derek Jack, MD [place] [phone]

## 2018-07-26 NOTE — Patient Instructions (Addendum)
[place] at [facility] Discharge Instructions  Follow up in 4 weeks with labs and treatment.    Thank you for choosing [place] at [facility] to provide your oncology and hematology care.  To afford each patient quality time with our provider, please arrive at least 15 minutes before your scheduled appointment time.   If you have a lab appointment with the [facility] please come in thru the  Main Entrance and check in at the main information desk  You need to re-schedule your appointment should you arrive 10 or more minutes late.  We strive to give you quality time with our providers, and arriving late affects you and other patients whose appointments are after yours.  Also, if you no show three or more times for appointments you may be dismissed from the clinic at the providers discretion.     Again, thank you for choosing [facility].  Our hope is that these requests will decrease the amount of time that you wait before being seen by our physicians.       _____________________________________________________________  Should you have questions after your visit to [facility], please contact our office at [phone] between the hours of 8:00 a.m. and 4:30 p.m.  Voicemails left after 4:00 p.m. will not be returned until the following business day.  For prescription refill requests, have your pharmacy contact our office and allow 72 hours.    Cancer Center Support Programs:   > Cancer Support Group  2nd Tuesday of the month 1pm-2pm, Journey Room

## 2018-07-26 NOTE — Assessment & Plan Note (Signed)
1.  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 - Therapeutic and dose was reduced to 1.2 mg/m during cycle 4 and cycle duration changed to every 4 weeks during cycle 5.  - CT scan on 01/06/2018 after 6 cycles of chemotherapy showed slight improvement in the lung lesions.  It can be considered more or less stable disease.  No new areas were seen. - CT CAP on 05/16/2018 after 10 cycles showed previously identified right-sided pulmonary nodules stable, no new or progressive findings in the malignancy in the chest, abdomen and pelvis. - We reviewed his blood work today.  There is adequate to proceed without any dose modifications. - We plan to repeat CT scans in 3 to 4 months from the last scan.  If he continues to do well, will wait until 4 months.  2.  Normocytic anemia: -This is chemotherapy-induced.  Last Feraheme infusion was on 10/29/2017.    3.  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   4. Cancer/chemotherapy related pains: -He is taking 1 Percocet 5 mg tablet in the mornings for his low back pain which is helping.

## 2018-07-27 ENCOUNTER — Inpatient Hospital Stay (HOSPITAL_COMMUNITY): Payer: Medicare PPO

## 2018-07-27 VITALS — BP 133/58 | HR 67 | Temp 97.9°F | Resp 18 | Wt 154.4 lb

## 2018-07-27 DIAGNOSIS — C772 Secondary and unspecified malignant neoplasm of intra-abdominal lymph nodes: Secondary | ICD-10-CM | POA: Diagnosis not present

## 2018-07-27 DIAGNOSIS — Z5111 Encounter for antineoplastic chemotherapy: Secondary | ICD-10-CM | POA: Diagnosis not present

## 2018-07-27 DIAGNOSIS — C3490 Malignant neoplasm of unspecified part of unspecified bronchus or lung: Secondary | ICD-10-CM

## 2018-07-27 DIAGNOSIS — C787 Secondary malignant neoplasm of liver and intrahepatic bile duct: Secondary | ICD-10-CM | POA: Diagnosis not present

## 2018-07-27 MED ORDER — SODIUM CHLORIDE 0.9 % IV SOLN
Freq: Once | INTRAVENOUS | Status: AC
  Administered 2018-07-27: 14:00:00 via INTRAVENOUS
  Filled 2018-07-27: qty 2

## 2018-07-27 MED ORDER — SODIUM CHLORIDE 0.9 % IV SOLN
Freq: Once | INTRAVENOUS | Status: AC
  Administered 2018-07-27: 14:00:00 via INTRAVENOUS

## 2018-07-27 MED ORDER — HEPARIN SOD (PORK) LOCK FLUSH 100 UNIT/ML IV SOLN
500.0000 [IU] | Freq: Once | INTRAVENOUS | Status: AC
  Administered 2018-07-27: 500 [IU] via INTRAVENOUS

## 2018-07-27 MED ORDER — SODIUM CHLORIDE 0.9% FLUSH
10.0000 mL | INTRAVENOUS | Status: DC | PRN
  Administered 2018-07-27: 10 mL
  Filled 2018-07-27: qty 10

## 2018-07-27 MED ORDER — TOPOTECAN HCL CHEMO INJECTION 4 MG
1.2000 mg/m2 | Freq: Once | INTRAVENOUS | Status: AC
  Administered 2018-07-27: 2.2 mg via INTRAVENOUS
  Filled 2018-07-27: qty 2.2

## 2018-07-27 NOTE — Progress Notes (Signed)
Treatment given today per MD orders. Tolerated infusion without adverse affects. Vital signs stable. No complaints at this time. Discharged from clinic ambulatory. F/U with [facility] as scheduled.   

## 2018-07-27 NOTE — Patient Instructions (Signed)

## 2018-07-28 ENCOUNTER — Inpatient Hospital Stay (HOSPITAL_COMMUNITY): Payer: Medicare PPO

## 2018-07-28 ENCOUNTER — Other Ambulatory Visit (HOSPITAL_COMMUNITY): Payer: Self-pay | Admitting: Nurse Practitioner

## 2018-07-28 VITALS — BP 133/59 | HR 63 | Temp 98.2°F | Resp 18 | Wt 152.6 lb

## 2018-07-28 DIAGNOSIS — C787 Secondary malignant neoplasm of liver and intrahepatic bile duct: Secondary | ICD-10-CM | POA: Diagnosis not present

## 2018-07-28 DIAGNOSIS — Z5111 Encounter for antineoplastic chemotherapy: Secondary | ICD-10-CM | POA: Diagnosis not present

## 2018-07-28 DIAGNOSIS — C772 Secondary and unspecified malignant neoplasm of intra-abdominal lymph nodes: Secondary | ICD-10-CM | POA: Diagnosis not present

## 2018-07-28 DIAGNOSIS — C3490 Malignant neoplasm of unspecified part of unspecified bronchus or lung: Secondary | ICD-10-CM

## 2018-07-28 MED ORDER — TOPOTECAN HCL CHEMO INJECTION 4 MG
1.2000 mg/m2 | Freq: Once | INTRAVENOUS | Status: AC
  Administered 2018-07-28: 2.2 mg via INTRAVENOUS
  Filled 2018-07-28: qty 2.2

## 2018-07-28 MED ORDER — SODIUM CHLORIDE 0.9% FLUSH
10.0000 mL | INTRAVENOUS | Status: DC | PRN
  Administered 2018-07-28: 10 mL
  Filled 2018-07-28: qty 10

## 2018-07-28 MED ORDER — SODIUM CHLORIDE 0.9 % IV SOLN
Freq: Once | INTRAVENOUS | Status: AC
  Administered 2018-07-28: 14:00:00 via INTRAVENOUS

## 2018-07-28 MED ORDER — SODIUM CHLORIDE 0.9 % IV SOLN
Freq: Once | INTRAVENOUS | Status: AC
  Administered 2018-07-28: 14:00:00 via INTRAVENOUS
  Filled 2018-07-28: qty 2

## 2018-07-28 NOTE — Patient Instructions (Signed)
[facility] Discharge Instructions for Patients Receiving Chemotherapy  Today you received the following chemotherapy agents  If you develop nausea and vomiting that is not controlled by your nausea medication, call the clinic.   BELOW ARE SYMPTOMS THAT SHOULD BE REPORTED IMMEDIATELY:  *FEVER GREATER THAN 100.5 F  *CHILLS WITH OR WITHOUT FEVER  NAUSEA AND VOMITING THAT IS NOT CONTROLLED WITH YOUR NAUSEA MEDICATION  *UNUSUAL SHORTNESS OF BREATH  *UNUSUAL BRUISING OR BLEEDING  TENDERNESS IN MOUTH AND THROAT WITH OR WITHOUT PRESENCE OF ULCERS  *URINARY PROBLEMS  *BOWEL PROBLEMS  UNUSUAL RASH Items with * indicate a potential emergency and should be followed up as soon as possible.  Feel free to call the clinic should you have any questions or concerns. The clinic phone number is [phone].  Please show the CHEMO ALERT CARD at check-in to the Emergency Department and triage nurse.   

## 2018-07-28 NOTE — Progress Notes (Signed)
Patient tolerated chemotherapy with no complaints voiced.  Port site clean and dry with no bruising or swelling noted at site.  Good blood return noted before and after administration of chemotherapy.  Patient left ambulatory with VSS and no s/s of distress noted.

## 2018-07-29 ENCOUNTER — Inpatient Hospital Stay (HOSPITAL_COMMUNITY): Payer: Medicare PPO

## 2018-07-29 ENCOUNTER — Encounter (HOSPITAL_COMMUNITY): Payer: Self-pay

## 2018-07-29 VITALS — BP 151/51 | HR 58 | Temp 97.7°F | Resp 18 | Wt 152.8 lb

## 2018-07-29 DIAGNOSIS — C787 Secondary malignant neoplasm of liver and intrahepatic bile duct: Secondary | ICD-10-CM | POA: Diagnosis not present

## 2018-07-29 DIAGNOSIS — C3490 Malignant neoplasm of unspecified part of unspecified bronchus or lung: Secondary | ICD-10-CM

## 2018-07-29 DIAGNOSIS — C772 Secondary and unspecified malignant neoplasm of intra-abdominal lymph nodes: Secondary | ICD-10-CM | POA: Diagnosis not present

## 2018-07-29 DIAGNOSIS — Z5111 Encounter for antineoplastic chemotherapy: Secondary | ICD-10-CM | POA: Diagnosis not present

## 2018-07-29 MED ORDER — SODIUM CHLORIDE 0.9% FLUSH
10.0000 mL | INTRAVENOUS | Status: DC | PRN
  Administered 2018-07-29: 10 mL
  Filled 2018-07-29: qty 10

## 2018-07-29 MED ORDER — TOPOTECAN HCL CHEMO INJECTION 4 MG
1.2000 mg/m2 | Freq: Once | INTRAVENOUS | Status: AC
  Administered 2018-07-29: 2.2 mg via INTRAVENOUS
  Filled 2018-07-29: qty 2.2

## 2018-07-29 MED ORDER — SODIUM CHLORIDE 0.9 % IV SOLN
Freq: Once | INTRAVENOUS | Status: AC
  Administered 2018-07-29: 14:00:00 via INTRAVENOUS

## 2018-07-29 MED ORDER — SODIUM CHLORIDE 0.9 % IV SOLN
Freq: Once | INTRAVENOUS | Status: AC
  Administered 2018-07-29: 14:00:00 via INTRAVENOUS
  Filled 2018-07-29: qty 2

## 2018-07-29 NOTE — Patient Instructions (Signed)
[facility] Discharge Instructions for Patients Receiving Chemotherapy  Today you received the following chemotherapy agents  If you develop nausea and vomiting that is not controlled by your nausea medication, call the clinic.   BELOW ARE SYMPTOMS THAT SHOULD BE REPORTED IMMEDIATELY:  *FEVER GREATER THAN 100.5 F  *CHILLS WITH OR WITHOUT FEVER  NAUSEA AND VOMITING THAT IS NOT CONTROLLED WITH YOUR NAUSEA MEDICATION  *UNUSUAL SHORTNESS OF BREATH  *UNUSUAL BRUISING OR BLEEDING  TENDERNESS IN MOUTH AND THROAT WITH OR WITHOUT PRESENCE OF ULCERS  *URINARY PROBLEMS  *BOWEL PROBLEMS  UNUSUAL RASH Items with * indicate a potential emergency and should be followed up as soon as possible.  Feel free to call the clinic should you have any questions or concerns. The clinic phone number is [phone].  Please show the CHEMO ALERT CARD at check-in to the Emergency Department and triage nurse.   

## 2018-07-29 NOTE — Progress Notes (Signed)
Patient tolerated chemotherapy with no complaints voiced.  Port site clean and dry with no bruising or swelling noted at site.  Good blood return noted before and after administration of chemotherapy.    Patient left ambulatory with VSS and no s/s of distress noted.

## 2018-07-30 ENCOUNTER — Encounter (HOSPITAL_COMMUNITY): Payer: Self-pay

## 2018-07-30 ENCOUNTER — Inpatient Hospital Stay (HOSPITAL_COMMUNITY): Payer: Medicare PPO

## 2018-07-30 VITALS — BP 144/55 | HR 63 | Temp 98.3°F | Resp 18

## 2018-07-30 DIAGNOSIS — C772 Secondary and unspecified malignant neoplasm of intra-abdominal lymph nodes: Secondary | ICD-10-CM | POA: Diagnosis not present

## 2018-07-30 DIAGNOSIS — Z5111 Encounter for antineoplastic chemotherapy: Secondary | ICD-10-CM | POA: Diagnosis not present

## 2018-07-30 DIAGNOSIS — C3490 Malignant neoplasm of unspecified part of unspecified bronchus or lung: Secondary | ICD-10-CM | POA: Diagnosis not present

## 2018-07-30 DIAGNOSIS — C787 Secondary malignant neoplasm of liver and intrahepatic bile duct: Secondary | ICD-10-CM | POA: Diagnosis not present

## 2018-07-30 MED ORDER — SODIUM CHLORIDE 0.9 % IV SOLN
Freq: Once | INTRAVENOUS | Status: AC
  Administered 2018-07-30: 14:00:00 via INTRAVENOUS

## 2018-07-30 MED ORDER — SODIUM CHLORIDE 0.9% FLUSH
10.0000 mL | INTRAVENOUS | Status: DC | PRN
  Administered 2018-07-30: 10 mL
  Filled 2018-07-30: qty 10

## 2018-07-30 MED ORDER — HEPARIN SOD (PORK) LOCK FLUSH 100 UNIT/ML IV SOLN
500.0000 [IU] | Freq: Once | INTRAVENOUS | Status: AC | PRN
  Administered 2018-07-30: 500 [IU]
  Filled 2018-07-30: qty 5

## 2018-07-30 MED ORDER — TOPOTECAN HCL CHEMO INJECTION 4 MG
1.2000 mg/m2 | Freq: Once | INTRAVENOUS | Status: AC
  Administered 2018-07-30: 2.2 mg via INTRAVENOUS
  Filled 2018-07-30: qty 2.2

## 2018-07-30 MED ORDER — PEGFILGRASTIM 6 MG/0.6ML ~~LOC~~ PSKT
6.0000 mg | PREFILLED_SYRINGE | Freq: Once | SUBCUTANEOUS | Status: AC
  Administered 2018-07-30: 6 mg via SUBCUTANEOUS
  Filled 2018-07-30: qty 0.6

## 2018-07-30 MED ORDER — SODIUM CHLORIDE 0.9 % IV SOLN
Freq: Once | INTRAVENOUS | Status: AC
  Administered 2018-07-30: 14:00:00 via INTRAVENOUS
  Filled 2018-07-30: qty 2

## 2018-07-30 NOTE — Progress Notes (Signed)
Harvie Junior tolerated chemo infusion with Neulasta on-pro well without complaints or incident. Neulasta on-pro applied to pt's right arm with green indicator light flashing. VSS upon discharge. Pt discharged self ambulatory in satisfactory condition

## 2018-07-30 NOTE — Patient Instructions (Signed)
[facility] Discharge Instructions for Patients Receiving Chemotherapy   Beginning January 23rd 2017 lab work for the [facility] will be done in the  Main lab at [facility] on 1st floor. If you have a lab appointment with the [facility] please come in thru the  Main Entrance and check in at the main information desk   Today you received the following chemotherapy agents Topotecan as well as Neulasta on-pro. Follow-up as scheduled. Call clinic for any questions or concerns  To help prevent nausea and vomiting after your treatment, we encourage you to take your nausea medication   If you develop nausea and vomiting, or diarrhea that is not controlled by your medication, call the clinic.  The clinic phone number is [phone]. Office hours are Monday-Friday 8:30am-5:00pm.  BELOW ARE SYMPTOMS THAT SHOULD BE REPORTED IMMEDIATELY:  *FEVER GREATER THAN 101.0 F  *CHILLS WITH OR WITHOUT FEVER  NAUSEA AND VOMITING THAT IS NOT CONTROLLED WITH YOUR NAUSEA MEDICATION  *UNUSUAL SHORTNESS OF BREATH  *UNUSUAL BRUISING OR BLEEDING  TENDERNESS IN MOUTH AND THROAT WITH OR WITHOUT PRESENCE OF ULCERS  *URINARY PROBLEMS  *BOWEL PROBLEMS  UNUSUAL RASH Items with * indicate a potential emergency and should be followed up as soon as possible. If you have an emergency after office hours please contact your primary care physician or go to the nearest emergency department.  Please call the clinic during office hours if you have any questions or concerns.   You may also contact the Patient Navigator at [phone] should you have any questions or need assistance in obtaining follow up care.      Resources For Cancer Patients and their Caregivers ? [employer]: Can assist with transportation, wigs, general needs, runs Look Good Feel Better.        941 423 9677 ? Cancer Care: Provides financial assistance, online support groups, medication/co-pay  assistance.  1-800-813-HOPE 956-469-9367) ? [facility] Assists [place] cancer patients and their families through emotional , educational and financial support.  (915) 241-8809 ? [place] DSS Where to apply for food stamps, Medicaid and utility assistance. 346-019-1180 ? [facility]: Transportation to medical appointments. (724)343-0229 ? Social Security Administration: May apply for disability if have a Stage IV cancer. 507-148-2401 281-228-3462 ? [employer], Disability and Transit Services: Assists with nutrition, care and transit needs. ([phone]

## 2018-08-23 ENCOUNTER — Inpatient Hospital Stay (HOSPITAL_COMMUNITY): Payer: Medicare PPO

## 2018-08-23 ENCOUNTER — Inpatient Hospital Stay (HOSPITAL_BASED_OUTPATIENT_CLINIC_OR_DEPARTMENT_OTHER): Payer: Medicare PPO | Admitting: Hematology

## 2018-08-23 ENCOUNTER — Encounter (HOSPITAL_COMMUNITY): Payer: Self-pay | Admitting: Hematology

## 2018-08-23 ENCOUNTER — Inpatient Hospital Stay (HOSPITAL_COMMUNITY): Payer: Medicare PPO | Attending: Hematology

## 2018-08-23 ENCOUNTER — Other Ambulatory Visit: Payer: Self-pay

## 2018-08-23 VITALS — BP 122/60 | HR 70 | Temp 97.8°F | Resp 17

## 2018-08-23 DIAGNOSIS — D6481 Anemia due to antineoplastic chemotherapy: Secondary | ICD-10-CM

## 2018-08-23 DIAGNOSIS — G893 Neoplasm related pain (acute) (chronic): Secondary | ICD-10-CM | POA: Diagnosis not present

## 2018-08-23 DIAGNOSIS — Z5111 Encounter for antineoplastic chemotherapy: Secondary | ICD-10-CM | POA: Diagnosis not present

## 2018-08-23 DIAGNOSIS — C3490 Malignant neoplasm of unspecified part of unspecified bronchus or lung: Secondary | ICD-10-CM

## 2018-08-23 DIAGNOSIS — C787 Secondary malignant neoplasm of liver and intrahepatic bile duct: Secondary | ICD-10-CM | POA: Insufficient documentation

## 2018-08-23 DIAGNOSIS — R197 Diarrhea, unspecified: Secondary | ICD-10-CM | POA: Insufficient documentation

## 2018-08-23 DIAGNOSIS — T451X5A Adverse effect of antineoplastic and immunosuppressive drugs, initial encounter: Secondary | ICD-10-CM

## 2018-08-23 DIAGNOSIS — G620 Drug-induced polyneuropathy: Secondary | ICD-10-CM

## 2018-08-23 LAB — CBC WITH DIFFERENTIAL/PLATELET
Abs Immature Granulocytes: 0.06 10*3/uL (ref 0.00–0.07)
Basophils Absolute: 0 10*3/uL (ref 0.0–0.1)
Basophils Relative: 0 %
Eosinophils Absolute: 0.1 10*3/uL (ref 0.0–0.5)
Eosinophils Relative: 1 %
HCT: 29.6 % — ABNORMAL LOW (ref 39.0–52.0)
Hemoglobin: 9.2 g/dL — ABNORMAL LOW (ref 13.0–17.0)
Immature Granulocytes: 1 %
Lymphocytes Relative: 15 %
Lymphs Abs: 1.1 10*3/uL (ref 0.7–4.0)
MCH: 30.7 pg (ref 26.0–34.0)
MCHC: 31.1 g/dL (ref 30.0–36.0)
MCV: 98.7 fL (ref 80.0–100.0)
Monocytes Absolute: 1 10*3/uL (ref 0.1–1.0)
Monocytes Relative: 14 %
Neutro Abs: 5 10*3/uL (ref 1.7–7.7)
Neutrophils Relative %: 69 %
Platelets: 347 10*3/uL (ref 150–400)
RBC: 3 MIL/uL — ABNORMAL LOW (ref 4.22–5.81)
RDW: 22.7 % — ABNORMAL HIGH (ref 11.5–15.5)
WBC: 7.3 10*3/uL (ref 4.0–10.5)
nRBC: 0 % (ref 0.0–0.2)

## 2018-08-23 LAB — COMPREHENSIVE METABOLIC PANEL
ALT: 15 U/L (ref 0–44)
AST: 15 U/L (ref 15–41)
Albumin: 4 g/dL (ref 3.5–5.0)
Alkaline Phosphatase: 61 U/L (ref 38–126)
Anion gap: 7 (ref 5–15)
BUN: 18 mg/dL (ref 8–23)
CO2: 25 mmol/L (ref 22–32)
Calcium: 8.7 mg/dL — ABNORMAL LOW (ref 8.9–10.3)
Chloride: 105 mmol/L (ref 98–111)
Creatinine, Ser: 0.86 mg/dL (ref 0.61–1.24)
GFR calc Af Amer: >60 mL/min (ref >60)
GFR calc non Af Amer: >60 mL/min (ref >60)
Glucose, Bld: 106 mg/dL — ABNORMAL HIGH (ref 70–99)
Potassium: 3.7 mmol/L (ref 3.5–5.1)
Sodium: 137 mmol/L (ref 135–145)
Total Bilirubin: 0.3 mg/dL (ref 0.3–1.2)
Total Protein: 7 g/dL (ref 6.5–8.1)

## 2018-08-23 MED ORDER — HEPARIN SOD (PORK) LOCK FLUSH 100 UNIT/ML IV SOLN
500.0000 [IU] | Freq: Once | INTRAVENOUS | Status: AC
  Administered 2018-08-23: 500 [IU] via INTRAVENOUS

## 2018-08-23 MED ORDER — SODIUM CHLORIDE 0.9% FLUSH
10.0000 mL | Freq: Once | INTRAVENOUS | Status: AC
  Administered 2018-08-23: 10 mL

## 2018-08-23 MED ORDER — OXYCODONE-ACETAMINOPHEN 5-325 MG PO TABS: 1.0000 | ORAL_TABLET | Freq: Two times a day (BID) | ORAL | 0 refills | Status: DC | PRN

## 2018-08-23 MED ORDER — SODIUM CHLORIDE 0.9 % IV SOLN
Freq: Once | INTRAVENOUS | Status: AC
  Administered 2018-08-23: 11:00:00 via INTRAVENOUS
  Filled 2018-08-23: qty 2

## 2018-08-23 MED ORDER — SODIUM CHLORIDE 0.9 % IV SOLN
Freq: Once | INTRAVENOUS | Status: AC
  Administered 2018-08-23: 10:00:00 via INTRAVENOUS

## 2018-08-23 MED ORDER — TOPOTECAN HCL CHEMO INJECTION 4 MG
1.2000 mg/m2 | Freq: Once | INTRAVENOUS | Status: AC
  Administered 2018-08-23: 2.2 mg via INTRAVENOUS
  Filled 2018-08-23: qty 2.2

## 2018-08-23 NOTE — Progress Notes (Signed)
Pt presents today for Topotecan. VSS. Pt seen by Dr. Delton Coombes today. Per RLockamy NP, proceed with treatment. MAR reviewed. No complaints of any changes since the last visit.   Treatment given today per MD orders. Tolerated infusion without adverse affects. Vital signs stable. No complaints at this time. Discharged from clinic ambulatory. F/U with [facility] as scheduled.

## 2018-08-23 NOTE — Progress Notes (Signed)
[place], [place] [address]   CLINIC:  Medical Oncology/Hematology  PCP:  [name], MD [address] [facility] [place] [id number] [phone]   REASON FOR VISIT: Follow-up for extensive stage small cell lung cancer.  CURRENT THERAPY:Topotecan every 4 weeks.   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 Bulky 5.4 cm right supraclavicular region malignant lymph node conglomeration with extracapsular extension. 2. Surrounding smaller abnormal right level 3 and level 5 lymph nodes, and the lymphadenopathy continues into the superior mediastinum, see Chest CT findings reported separately. 3. No other metastatic disease identified in the neck.    01/16/2017 Imaging    CT chest: IMPRESSION: 1. Extensive lymphadenopathy in the thorax and lower right cervical region, as discussed above. Primary differential considerations include lymphoma/leukemia or small cell carcinoma of the lung. Further evaluation a PET-CT could be considered to assess for additional sites of disease below the diaphragm if clinically appropriate. Additionally, ultrasound-guided biopsy of supraclavicular lymphadenopathy could be considered to establish a tissue diagnosis. 2. Indeterminate lesion in the periphery of segment 8 of the liver measuring 2.7 x 1.7 cm. Attention at time of follow-up PET-CT is recommended. 3. Aortic atherosclerosis, in addition to left main and 3 vessel coronary artery disease. Please note that although the presence of coronary artery calcium documents the presence of coronary artery disease, the severity of this disease and any potential stenosis cannot be assessed on this non-gated CT examination. Assessment for potential risk factor modification, dietary therapy or pharmacologic therapy may be warranted, if clinically indicated. 4. There  are calcifications of the aortic valve. Echocardiographic correlation for evaluation of potential valvular dysfunction may be warranted if clinically indicated. 5. Diffuse bronchial wall thickening with moderate centrilobular and paraseptal emphysema; imaging findings suggestive of underlying COPD.    02/03/2017 Initial Biopsy    (R) neck lymph node biopsy: SMALL CELL CARCINOMA (most likely lung primary).     02/03/2017 Miscellaneous    Port-a-cath attempted by IR; unable to place d/t enlarged SVC.     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 Staging for metastatic disease  EXAM: MRI HEAD WITHOUT AND WITH CONTRAST  TECHNIQUE: Multiplanar, multiecho pulse sequences of the brain and surrounding structures were obtained without and with intravenous contrast.  CONTRAST:  49m MULTIHANCE GADOBENATE DIMEGLUMINE 529 MG/ML IV SOLN  COMPARISON:  None.  FINDINGS: Brain: Negative for hydrocephalus. Cerebral volume normal for age. Small nonenhancing white matter hyperintensities consistent with mild chronic microvascular ischemia. No acute infarct. Negative for hemorrhage or mass or edema  Normal enhancement postcontrast infusion. No enhancing mass lesion. Leptomeningeal enhancement is normal.  Vascular: Normal arterial flow voids.  Normal venous enhancement  Skull and upper cervical spine: Negative  Sinuses/Orbits: Negative  Other: None  IMPRESSION: Negative for metastatic disease.  No acute abnormality.  Mild chronic white matter changes.    04/07/2017 Imaging     PET:  1. Marked reduction in size and metabolic activity of bulky RIGHT supraclavicular adenopathy mediastinal lymphadenopathy. 2. Residual moderate activity remains within small  RIGHT supraclavicular lymph node, RIGHT lower paratracheal lymph node and RIGHT hilar  lymph node. 3. Resolution of prevascular and internal mammary mediastinal metastatic hypermetabolic activity. 4. Resolution of metabolic activity associated with solitary RIGHT hepatic lobe liver metastasis. 5. No evidence of disease progression. 6. 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    05/27/2017 Imaging    MRI brain w/ and w/o contrast IMPRESSION: 1. No metastatic disease identified. 2. Increased nonspecific cerebral white matter signal changes since August. These are most commonly small vessel disease related. 3. New right maxillary sinusitis. Benign appearing retention cysts in the nasopharynx with trace mastoid effusions.    06/12/2017 Imaging    PET-CT IMPRESSION: 1. There are two new hypermetabolic nodules identified within both lower lobes measuring up to 3.1 cm. The appearance is nonspecific and may be inflammatory/infectious in etiology. Pulmonary metastatic disease cannot be excluded and short-term follow-up imaging in 3 months is advised to reassess these nodules. 2. Stable appearance of mild hypermetabolic activity associated with right paratracheal and right hilar lymph nodes. 3. Decrease in FDG uptake associated with index right supraclavicular lymph node. 4. 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 5. Aortic Atherosclerosis (ICD10-I70.0) and Emphysema (ICD10-J43.9).    08/24/2017 -  Chemotherapy    The patient had pegfilgrastim (NEULASTA ONPRO KIT) injection 6 mg, 6 mg, Subcutaneous, Once, 14 of 16 cycles Administration: 6 mg (08/28/2017), 6 mg (09/18/2017), 6 mg (10/09/2017), 6 mg (11/02/2017), 6 mg (11/20/2017), 6 mg (12/18/2017), 6 mg (01/15/2018), 6 mg (02/12/2018), 6 mg (03/12/2018), 6 mg  (04/09/2018), 6 mg (05/07/2018), 6 mg (06/04/2018), 6 mg (07/02/2018), 6 mg (07/30/2018) topotecan (HYCAMTIN) 2.9 mg in sodium chloride 0.9 % 100 mL chemo infusion, 1.5 mg/m2 = 2.9 mg, Intravenous,  Once, 14 of 16 cycles Dose modification: 1.2 mg/m2 (80 % of original dose 1.5 mg/m2, Cycle 4, Reason: Dose Not Tolerated) Administration: 2.9 mg (08/24/2017), 2.9 mg (08/25/2017), 2.9 mg (08/28/2017), 2.9 mg (08/26/2017), 2.9 mg (08/27/2017), 2.9 mg (09/14/2017), 2.9 mg (09/15/2017), 2.9 mg (09/16/2017), 2.9 mg (09/17/2017), 2.9 mg (09/18/2017), 2.9 mg (10/05/2017), 2.9 mg (10/06/2017), 2.9 mg (10/07/2017), 2.9 mg (10/08/2017), 2.9 mg (10/09/2017), 2.3 mg (10/26/2017), 2.3 mg (10/27/2017), 2.3 mg (10/28/2017), 2.3 mg (10/29/2017), 2.3 mg (11/02/2017), 2.3 mg (11/16/2017), 2.3 mg (11/17/2017), 2.3 mg (11/18/2017), 2.3 mg (11/19/2017), 2.3 mg (11/20/2017), 2.3 mg (12/14/2017), 2.3 mg (12/15/2017), 2.3 mg (12/16/2017), 2.3 mg (12/17/2017), 2.3 mg (12/18/2017), 2.3 mg (01/11/2018), 2.3 mg (01/12/2018), 2.3 mg (01/13/2018), 2.3 mg (01/15/2018), 2.3 mg (02/08/2018), 2.3 mg (02/09/2018), 2.3 mg (02/10/2018), 2.3 mg (02/11/2018), 2.3 mg (02/12/2018), 2.3 mg (03/08/2018), 2.3 mg (03/09/2018), 2.3 mg (03/10/2018), 2.3 mg (03/11/2018), 2.3 mg (03/12/2018), 2.2 mg (04/05/2018), 2.2 mg (04/06/2018), 2.2 mg (04/07/2018), 2.2 mg (04/08/2018), 2.2 mg (04/09/2018), 2.2 mg (05/03/2018), 2.2 mg (05/04/2018), 2.2 mg (05/05/2018), 2.2 mg (05/06/2018), 2.2 mg (05/07/2018), 2.2 mg (05/31/2018), 2.2 mg (06/01/2018), 2.2 mg (06/02/2018), 2.2 mg (06/03/2018), 2.2 mg (06/04/2018), 2.2 mg (06/28/2018), 2.2 mg (06/29/2018), 2.2 mg (06/30/2018), 2.2 mg (07/01/2018), 2.2 mg (07/02/2018), 2.2 mg (07/26/2018), 2.2 mg (07/27/2018), 2.2 mg (07/28/2018), 2.2 mg (07/29/2018), 2.2 mg (07/30/2018) ondansetron (ZOFRAN) 4 mg in sodium chloride 0.9 % 50 mL IVPB, , Intravenous,  Once, 9 of 11 cycles Administration:  (02/08/2018),  (02/09/2018),  (02/10/2018),  (02/11/2018),  (02/12/2018),  (03/08/2018),  (03/09/2018),  (03/10/2018),   (03/11/2018),  (03/12/2018),  (04/05/2018),  (04/06/2018),  (04/07/2018),  (04/08/2018),  (04/09/2018),  (05/03/2018),  (05/04/2018),  (05/05/2018),  (05/06/2018),  (  05/07/2018),  (05/31/2018),  (06/01/2018),  (06/02/2018),  (06/03/2018),  (06/04/2018),  (06/28/2018),  (06/29/2018),  (06/30/2018),  (07/01/2018),  (07/02/2018),  (07/26/2018),  (07/27/2018),  (07/28/2018),  (07/29/2018),  (07/30/2018)  for chemotherapy treatment.      INTERVAL HISTORY:  Mr. Holsomback 68 y.o. male returns for routine follow-up for extensive stage small cell lung cancer.He is doing well since his last visit. He reports having some issues with constipation. He also has numbness in his feet but it is stable at this time. Overall he is ready for his next treatment. Denies any nausea, vomiting, or diarrhea. Denies any new pains. Had not noticed any recent bleeding such as epistaxis, hematuria or hematochezia. Denies recent chest pain on exertion, shortness of breath on minimal exertion, pre-syncopal episodes, or palpitations. Denies any numbness or tingling in hands or feet. Denies any recent fevers, infections, or recent hospitalizations. Patient reports appetite at 25% and energy level at 50%. His appetite is down and he has to force himself to eat but he is maintaining his weight at this time.    REVIEW OF SYSTEMS:  Review of Systems  Constitutional: Positive for fatigue.  Gastrointestinal: Positive for constipation.  Neurological: Positive for numbness.  All other systems reviewed and are negative.    PAST MEDICAL/SURGICAL HISTORY:  Past Medical History:  Diagnosis Date  . Anxiety   . CAD (coronary artery disease)   . Cancer ([place])    stage 4 small cell lung cancer   . COPD (chronic obstructive pulmonary disease) ([place])   . Depression   . Dyspnea    increased exertion  . Feeling of chest tightness   . Heart palpitations   . History of chemotherapy   . Myocardial infarction ([place])   . Osteopenia   . Panic attacks   .  Smoker    Past Surgical History:  Procedure Laterality Date  . BACK SURGERY  12/24/2000   L5,S1  . CORONARY STENT PLACEMENT  2005   RCA & CX  . HERNIA REPAIR Right 1980's  . INGUINAL HERNIA REPAIR  12/1978   right side  . IR FLUORO GUIDE PORT INSERTION RIGHT  04/02/2017  . IR US GUIDE BX ASP/DRAIN  02/03/2017  . IR US GUIDE VASC ACCESS RIGHT  04/02/2017  . NM MYOCAR PERF WALL MOTION  09/07/2009   No ischemia; EF 51%  . SHOULDER SURGERY Left 08/2010  . SPINE SURGERY  2002   L5-S1     SOCIAL HISTORY:  Social History   Socioeconomic History  . Marital status: Single    Spouse name: Not on file  . Number of children: Not on file  . Years of education: Not on file  . Highest education level: Not on file  Occupational History  . Not on file  Social Needs  . Financial resource strain: Not on file  . Food insecurity:    Worry: Not on file    Inability: Not on file  . Transportation needs:    Medical: Not on file    Non-medical: Not on file  Tobacco Use  . Smoking status: Current Every Day Smoker    Packs/day: 1.00    Types: Cigarettes  . Smokeless tobacco: Never Used  Substance and Sexual Activity  . Alcohol use: Yes    Comment: occas  . Drug use: No  . Sexual activity: Not on file  Lifestyle  . Physical activity:    Days per week: Not on file    Minutes per session: Not on file  .  Stress: Not on file  Relationships  . Social connections:    Talks on phone: Not on file    Gets together: Not on file    Attends religious service: Not on file    Active member of club or organization: Not on file    Attends meetings of clubs or organizations: Not on file    Relationship status: Not on file  . Intimate partner violence:    Fear of current or ex partner: Not on file    Emotionally abused: Not on file    Physically abused: Not on file    Forced sexual activity: Not on file  Other Topics Concern  . Not on file  Social History Narrative  . Not on file    FAMILY  HISTORY:  Family History  Problem Relation Age of Onset  . Heart attack Father   . Kidney disease Father        renal failure  . Heart failure Mother   . Heart attack Mother   . Cancer Brother   . Diabetes Brother   . Alcohol abuse Brother   . Diabetes Sister     CURRENT MEDICATIONS:  Outpatient Encounter Medications as of 08/23/2018  Medication Sig  . ALPRAZolam (XANAX) 1 MG tablet TAKE (1) TABLET BY MOUTH (4) TIMES DAILY.  Mar[place] Kitchen aspirin EC 81 MG tablet Take 81 mg by mouth daily.  Marland Kitchen atorvastatin (LIPITOR) 40 MG tablet Take 1 tablet (40 mg total) by mouth daily.  . clotrimazole-betamethasone (LOTRISONE) cream Apply 1 application topically 2 (two) times daily.  . cyclobenzaprine (FLEXERIL) 5 MG tablet TAKE 1 TABLET THREE TIMES DAILY AS NEEDED FOR MUSCLE SPASMS.  Marland Kitchen gabapentin (NEURONTIN) 300 MG capsule Take 1 capsule (300 mg total) by mouth 2 (two) times daily.  Marland Kitchen lidocaine-prilocaine (EMLA) cream Apply to affected area once  . ondansetron (ZOFRAN) 8 MG tablet Take 1 tablet (8 mg total) by mouth 2 (two) times daily as needed. Start on the third day after cisplatin chemotherapy.  Marland Kitchen oxyCODONE-acetaminophen (PERCOCET/ROXICET) 5-325 MG tablet Take 1 tablet by mouth 2 (two) times daily as needed for severe pain.  Marland Kitchen Pegfilgrastim (NEULASTA ONPRO [place]) Inject into the skin. Every 21 days  . polyethylene glycol powder (GLYCOLAX/MIRALAX) powder Take 17 g by mouth daily.  . prochlorperazine (COMPAZINE) 10 MG tablet Take 1 tablet (10 mg total) by mouth every 6 (six) hours as needed (Nausea or vomiting).  . TOPOTECAN HCL IV Inject into the vein.  Marland Kitchen topotecan in sodium chloride 0.9 % 100 mL Inject into the vein once. Days 1-5 every 21 days  . [DISCONTINUED] oxyCODONE-acetaminophen (PERCOCET/ROXICET) 5-325 MG tablet TAKE 1 TABLET BY MOUTH EVERY 8 HOURS AS NEEDED FOR SEVERE PAIN   No facility-administered encounter medications on file as of 08/23/2018.     ALLERGIES:  Allergies  Allergen Reactions  .  Codeine Nausea Only  . Niaspan [Niacin Er]      PHYSICAL EXAM:  ECOG Performance status: 1  Vitals:   08/23/18 0900  BP: (!) 129/57  Pulse: 80  Resp: 16  Temp: 97.9 F (36.6 C)  SpO2: 96%   Filed Weights   08/23/18 0900  Weight: 154 lb 9 oz (70.1 kg)    Physical Exam Constitutional:      Appearance: Normal appearance. He is normal weight.  Cardiovascular:     Rate and Rhythm: Normal rate and regular rhythm.     Heart sounds: Normal heart sounds.  Pulmonary:     Effort: Pulmonary effort  is normal.     Breath sounds: Normal breath sounds.  Musculoskeletal: Normal range of motion.  Skin:    General: Skin is warm and dry.  Neurological:     Mental Status: He is alert and oriented to person, place, and time. Mental status is at baseline.  Psychiatric:        Mood and Affect: Mood normal.        Behavior: Behavior normal.        Thought Content: Thought content normal.        Judgment: Judgment normal.   Abdomen: No palpable hepatosplenomegaly.  No palpable adenopathy.   LABORATORY DATA:  I have reviewed the labs as listed.  CBC    Component Value Date/Time   WBC 7.3 08/23/2018 0847   RBC 3.00 (L) 08/23/2018 0847   HGB 9.2 (L) 08/23/2018 0847   HCT 29.6 (L) 08/23/2018 0847   PLT 347 08/23/2018 0847   MCV 98.7 08/23/2018 0847   MCH 30.7 08/23/2018 0847   MCHC 31.1 08/23/2018 0847   RDW 22.7 (H) 08/23/2018 0847   LYMPHSABS 1.1 08/23/2018 0847   MONOABS 1.0 08/23/2018 0847   EOSABS 0.1 08/23/2018 0847   BASOSABS 0.0 08/23/2018 0847   CMP Latest Ref Rng & Units 08/23/2018 07/26/2018 06/28/2018  Glucose 70 - 99 mg/dL 106(H) 103(H) 104(H)  BUN 8 - 23 mg/dL _0 Creatinine 0.61 - 1.24 mg/dL 0.86 0.78 0.91  Sodium 135 - 145 mmol/L 137 136 137  Potassium 3.5 - 5.1 mmol/L 3.7 4.0 4.3  Chloride 98 - 111 mmol/L 105 106 106  CO2 22 - 32 mmol/L _1 Calcium 8.9 - 10.3 mg/dL 8.7(L) 8.7(L) 8.6(L)  Total Protein 6.5 - 8.1 g/dL 7.0 6.6 6.3(L)  Total Bilirubin  0.3 - 1.2 mg/dL 0.3 0.4 0.5  Alkaline Phos 38 - 126 U/L 61 59 53  AST 15 - 41 U/L _2 ALT 0 - 44 U/L _3 DIAGNOSTIC IMAGING:  I have independently reviewed the scans and discussed with the patient.   I have reviewed Francene Finders, NP's note and agree with the documentation.  I personally performed a face-to-face visit, made revisions and my assessment and plan is as follows.    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  - PET/CT scan on 08/13/2017 showing progression with bilateral lung nodules and mediastinal adenopathy -Topotecan (1.5 mg/m square) for 5 days every 21 days started on 08/24/2017.  -He gets brain MRI done in [place] periodically.  Last scan was negative for metastatic disease.  -Topotecan Dose reduced to 1.2 mg/m during cycle 4.  It was also increased to every 4 weeks for better tolerability. - CT CAP on 05/17/1999 10:19 cycles showed previously identified right-sided pulmonary nodules stable, no new or progressive findings in the chest, abdomen and pelvis.  - He has tolerated cycle 14 on 07/26/2018 very well.  He has mild tiredness lasting for 1 to 2 days. -Physical examination today did not reveal any abnormalities.  We reviewed his blood work.  He may proceed with cycle 15 today. -We will see him back in 4 weeks for follow-up.  I plan to repeat CT CAP prior to next visit.   2.  Normocytic anemia: -This is chemotherapy-induced.  Last Feraheme infusion was on  10/29/2017.     3.  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   4.  Cancer/chemotherapy related pains: -He is taking Percocet 5 mg 1 tablet in the morning for his low back pain which is helping.      Orders placed this encounter:  Orders Placed This Encounter    Procedures  . CT CHEST W CONTRAST  . CT Abdomen Pelvis W Contrast  . Lactate dehydrogenase  . CBC with Differential/Platelet  . Comprehensive metabolic panel      Derek Jack, MD [place] [phone]

## 2018-08-23 NOTE — Patient Instructions (Signed)
[facility] Discharge Instructions for Patients Receiving Chemotherapy  Today you received the following chemotherapy agents   To help prevent nausea and vomiting after your treatment, we encourage you to take your nausea medication   If you develop nausea and vomiting that is not controlled by your nausea medication, call the clinic.   BELOW ARE SYMPTOMS THAT SHOULD BE REPORTED IMMEDIATELY:  *FEVER GREATER THAN 100.5 F  *CHILLS WITH OR WITHOUT FEVER  NAUSEA AND VOMITING THAT IS NOT CONTROLLED WITH YOUR NAUSEA MEDICATION  *UNUSUAL SHORTNESS OF BREATH  *UNUSUAL BRUISING OR BLEEDING  TENDERNESS IN MOUTH AND THROAT WITH OR WITHOUT PRESENCE OF ULCERS  *URINARY PROBLEMS  *BOWEL PROBLEMS  UNUSUAL RASH Items with * indicate a potential emergency and should be followed up as soon as possible.  Feel free to call the clinic should you have any questions or concerns. The clinic phone number is [phone].  Please show the CHEMO ALERT CARD at check-in to the Emergency Department and triage nurse.   

## 2018-08-23 NOTE — Assessment & Plan Note (Addendum)
1.  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  - PET/CT scan on 08/13/2017 showing progression with bilateral lung nodules and mediastinal adenopathy -Topotecan (1.5 mg/m square) for 5 days every 21 days started on 08/24/2017.  -He gets brain MRI done in [place] periodically.  Last scan was negative for metastatic disease.  -Topotecan Dose reduced to 1.2 mg/m during cycle 4.  It was also increased to every 4 weeks for better tolerability. - CT CAP on 05/17/1999 10:19 cycles showed previously identified right-sided pulmonary nodules stable, no new or progressive findings in the chest, abdomen and pelvis.  - He has tolerated cycle 14 on 07/26/2018 very well.  He has mild tiredness lasting for 1 to 2 days. -Physical examination today did not reveal any abnormalities.  We reviewed his blood work.  He may proceed with cycle 15 today. -We will see him back in 4 weeks for follow-up.  I plan to repeat CT CAP prior to next visit.   2.  Normocytic anemia: -This is chemotherapy-induced.  Last Feraheme infusion was on 10/29/2017.     3.  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   4.  Cancer/chemotherapy related pains: -He is taking Percocet 5 mg 1 tablet in the morning for his low back pain which is helping.

## 2018-08-23 NOTE — Patient Instructions (Signed)
[facility] at [facility] Discharge Instructions     Thank you for choosing [facility] at [facility] to provide your oncology and hematology care.  To afford each patient quality time with our provider, please arrive at least 15 minutes before your scheduled appointment time.   If you have a lab appointment with the Cancer Center please come in thru the  Main Entrance and check in at the main information desk  You need to re-schedule your appointment should you arrive 10 or more minutes late.  We strive to give you quality time with our providers, and arriving late affects you and other patients whose appointments are after yours.  Also, if you no show three or more times for appointments you may be dismissed from the clinic at the providers discretion.     Again, thank you for choosing [facility].  Our hope is that these requests will decrease the amount of time that you wait before being seen by our physicians.       _____________________________________________________________  Should you have questions after your visit to  Cancer Center, please contact our office at [phone] between the hours of 8:00 a.m. and 4:30 p.m.  Voicemails left after 4:00 p.m. will not be returned until the following business day.  For prescription refill requests, have your pharmacy contact our office and allow 72 hours.    Cancer Center Support Programs:   > Cancer Support Group  2nd Tuesday of the month 1pm-2pm, Journey Room    

## 2018-08-24 ENCOUNTER — Inpatient Hospital Stay (HOSPITAL_COMMUNITY): Payer: Medicare PPO

## 2018-08-24 VITALS — BP 114/48 | HR 65 | Temp 97.9°F | Resp 18 | Wt 155.8 lb

## 2018-08-24 DIAGNOSIS — C787 Secondary malignant neoplasm of liver and intrahepatic bile duct: Secondary | ICD-10-CM | POA: Diagnosis not present

## 2018-08-24 DIAGNOSIS — C3490 Malignant neoplasm of unspecified part of unspecified bronchus or lung: Secondary | ICD-10-CM

## 2018-08-24 DIAGNOSIS — Z5111 Encounter for antineoplastic chemotherapy: Secondary | ICD-10-CM | POA: Diagnosis not present

## 2018-08-24 DIAGNOSIS — R197 Diarrhea, unspecified: Secondary | ICD-10-CM | POA: Diagnosis not present

## 2018-08-24 MED ORDER — TOPOTECAN HCL CHEMO INJECTION 4 MG
1.2000 mg/m2 | Freq: Once | INTRAVENOUS | Status: AC
  Administered 2018-08-24: 2.2 mg via INTRAVENOUS
  Filled 2018-08-24: qty 2.2

## 2018-08-24 MED ORDER — SODIUM CHLORIDE 0.9 % IV SOLN
Freq: Once | INTRAVENOUS | Status: AC
  Administered 2018-08-24: 13:00:00 via INTRAVENOUS

## 2018-08-24 MED ORDER — SODIUM CHLORIDE 0.9 % IV SOLN
Freq: Once | INTRAVENOUS | Status: AC
  Administered 2018-08-24: 14:00:00 via INTRAVENOUS
  Filled 2018-08-24: qty 2

## 2018-08-24 MED ORDER — HEPARIN SOD (PORK) LOCK FLUSH 100 UNIT/ML IV SOLN
500.0000 [IU] | Freq: Once | INTRAVENOUS | Status: AC | PRN
  Administered 2018-08-24: 500 [IU]

## 2018-08-24 MED ORDER — SODIUM CHLORIDE 0.9% FLUSH
10.0000 mL | INTRAVENOUS | Status: DC | PRN
  Administered 2018-08-24 (×2): 10 mL
  Filled 2018-08-24 (×2): qty 10

## 2018-08-24 NOTE — Patient Instructions (Signed)
[facility] Discharge Instructions for Patients Receiving Chemotherapy  Today you received the following chemotherapy agents   To help prevent nausea and vomiting after your treatment, we encourage you to take your nausea medication   If you develop nausea and vomiting that is not controlled by your nausea medication, call the clinic.   BELOW ARE SYMPTOMS THAT SHOULD BE REPORTED IMMEDIATELY:  *FEVER GREATER THAN 100.5 F  *CHILLS WITH OR WITHOUT FEVER  NAUSEA AND VOMITING THAT IS NOT CONTROLLED WITH YOUR NAUSEA MEDICATION  *UNUSUAL SHORTNESS OF BREATH  *UNUSUAL BRUISING OR BLEEDING  TENDERNESS IN MOUTH AND THROAT WITH OR WITHOUT PRESENCE OF ULCERS  *URINARY PROBLEMS  *BOWEL PROBLEMS  UNUSUAL RASH Items with * indicate a potential emergency and should be followed up as soon as possible.  Feel free to call the clinic should you have any questions or concerns. The clinic phone number is [phone].  Please show the CHEMO ALERT CARD at check-in to the Emergency Department and triage nurse.   

## 2018-08-24 NOTE — Progress Notes (Signed)
Pt presents today for Day 2 of Topotecan. VSS. Pt has no complaints of any changes since the last visit. MAR reviewed.    Treatment given today per MD orders. Tolerated infusion without adverse affects. Vital signs stable. No complaints at this time. Discharged from clinic ambulatory. F/U with [facility] as scheduled.

## 2018-08-25 ENCOUNTER — Inpatient Hospital Stay (HOSPITAL_COMMUNITY): Payer: Medicare PPO

## 2018-08-25 VITALS — BP 132/50 | HR 84 | Temp 98.0°F | Resp 18 | Wt 157.0 lb

## 2018-08-25 DIAGNOSIS — C3490 Malignant neoplasm of unspecified part of unspecified bronchus or lung: Secondary | ICD-10-CM | POA: Diagnosis not present

## 2018-08-25 DIAGNOSIS — Z5111 Encounter for antineoplastic chemotherapy: Secondary | ICD-10-CM | POA: Diagnosis not present

## 2018-08-25 DIAGNOSIS — C787 Secondary malignant neoplasm of liver and intrahepatic bile duct: Secondary | ICD-10-CM | POA: Diagnosis not present

## 2018-08-25 DIAGNOSIS — R197 Diarrhea, unspecified: Secondary | ICD-10-CM | POA: Diagnosis not present

## 2018-08-25 MED ORDER — SODIUM CHLORIDE 0.9 % IV SOLN
Freq: Once | INTRAVENOUS | Status: AC
  Administered 2018-08-25: 13:00:00 via INTRAVENOUS
  Filled 2018-08-25: qty 2

## 2018-08-25 MED ORDER — HEPARIN SOD (PORK) LOCK FLUSH 100 UNIT/ML IV SOLN
500.0000 [IU] | Freq: Once | INTRAVENOUS | Status: AC | PRN
  Administered 2018-08-25: 500 [IU]

## 2018-08-25 MED ORDER — SODIUM CHLORIDE 0.9 % IV SOLN
Freq: Once | INTRAVENOUS | Status: AC
  Administered 2018-08-25: 13:00:00 via INTRAVENOUS

## 2018-08-25 MED ORDER — TOPOTECAN HCL CHEMO INJECTION 4 MG
1.2000 mg/m2 | Freq: Once | INTRAVENOUS | Status: AC
  Administered 2018-08-25: 2.2 mg via INTRAVENOUS
  Filled 2018-08-25: qty 2.2

## 2018-08-25 MED ORDER — SODIUM CHLORIDE 0.9% FLUSH
10.0000 mL | INTRAVENOUS | Status: DC | PRN
  Administered 2018-08-25: 10 mL
  Filled 2018-08-25: qty 10

## 2018-08-25 NOTE — Progress Notes (Signed)
Patient here for day 3, no new issues or complaints. Proceed with treatment today.

## 2018-08-25 NOTE — Patient Instructions (Signed)
[facility] Discharge Instructions for Patients Receiving Chemotherapy  Today you received the following chemotherapy agents   To help prevent nausea and vomiting after your treatment, we encourage you to take your nausea medication   If you develop nausea and vomiting that is not controlled by your nausea medication, call the clinic.   BELOW ARE SYMPTOMS THAT SHOULD BE REPORTED IMMEDIATELY:  *FEVER GREATER THAN 100.5 F  *CHILLS WITH OR WITHOUT FEVER  NAUSEA AND VOMITING THAT IS NOT CONTROLLED WITH YOUR NAUSEA MEDICATION  *UNUSUAL SHORTNESS OF BREATH  *UNUSUAL BRUISING OR BLEEDING  TENDERNESS IN MOUTH AND THROAT WITH OR WITHOUT PRESENCE OF ULCERS  *URINARY PROBLEMS  *BOWEL PROBLEMS  UNUSUAL RASH Items with * indicate a potential emergency and should be followed up as soon as possible.  Feel free to call the clinic should you have any questions or concerns. The clinic phone number is [phone].  Please show the CHEMO ALERT CARD at check-in to the Emergency Department and triage nurse.   

## 2018-08-26 ENCOUNTER — Inpatient Hospital Stay (HOSPITAL_COMMUNITY): Payer: Medicare PPO

## 2018-08-26 VITALS — BP 135/59 | HR 60 | Temp 97.7°F | Resp 18 | Wt 158.6 lb

## 2018-08-26 DIAGNOSIS — Z5111 Encounter for antineoplastic chemotherapy: Secondary | ICD-10-CM | POA: Diagnosis not present

## 2018-08-26 DIAGNOSIS — C3490 Malignant neoplasm of unspecified part of unspecified bronchus or lung: Secondary | ICD-10-CM

## 2018-08-26 DIAGNOSIS — R197 Diarrhea, unspecified: Secondary | ICD-10-CM | POA: Diagnosis not present

## 2018-08-26 DIAGNOSIS — C787 Secondary malignant neoplasm of liver and intrahepatic bile duct: Secondary | ICD-10-CM | POA: Diagnosis not present

## 2018-08-26 MED ORDER — SODIUM CHLORIDE 0.9 % IV SOLN
Freq: Once | INTRAVENOUS | Status: AC
  Administered 2018-08-26: 14:00:00 via INTRAVENOUS
  Filled 2018-08-26: qty 2

## 2018-08-26 MED ORDER — SODIUM CHLORIDE 0.9 % IV SOLN
Freq: Once | INTRAVENOUS | Status: AC
  Administered 2018-08-26: 14:00:00 via INTRAVENOUS

## 2018-08-26 MED ORDER — HEPARIN SOD (PORK) LOCK FLUSH 100 UNIT/ML IV SOLN
500.0000 [IU] | Freq: Once | INTRAVENOUS | Status: AC | PRN
  Administered 2018-08-26: 500 [IU]

## 2018-08-26 MED ORDER — SODIUM CHLORIDE 0.9% FLUSH
10.0000 mL | INTRAVENOUS | Status: DC | PRN
  Administered 2018-08-26: 10 mL
  Filled 2018-08-26: qty 10

## 2018-08-26 MED ORDER — TOPOTECAN HCL CHEMO INJECTION 4 MG
1.2000 mg/m2 | Freq: Once | INTRAVENOUS | Status: AC
  Administered 2018-08-26: 2.2 mg via INTRAVENOUS
  Filled 2018-08-26: qty 2.2

## 2018-08-26 NOTE — Progress Notes (Signed)
No new issues today. Proceed with day 4.   Treatment given per orders. Patient tolerated it well without problems. Vitals stable and discharged home from clinic ambulatory. Follow up as scheduled.

## 2018-08-26 NOTE — Patient Instructions (Signed)

## 2018-08-27 ENCOUNTER — Encounter (HOSPITAL_COMMUNITY): Payer: Self-pay

## 2018-08-27 ENCOUNTER — Inpatient Hospital Stay (HOSPITAL_COMMUNITY): Payer: Medicare PPO

## 2018-08-27 VITALS — BP 140/51 | HR 73 | Temp 98.1°F | Resp 16

## 2018-08-27 DIAGNOSIS — C787 Secondary malignant neoplasm of liver and intrahepatic bile duct: Secondary | ICD-10-CM | POA: Diagnosis not present

## 2018-08-27 DIAGNOSIS — C3490 Malignant neoplasm of unspecified part of unspecified bronchus or lung: Secondary | ICD-10-CM | POA: Diagnosis not present

## 2018-08-27 DIAGNOSIS — R197 Diarrhea, unspecified: Secondary | ICD-10-CM | POA: Diagnosis not present

## 2018-08-27 DIAGNOSIS — Z5111 Encounter for antineoplastic chemotherapy: Secondary | ICD-10-CM | POA: Diagnosis not present

## 2018-08-27 MED ORDER — HEPARIN SOD (PORK) LOCK FLUSH 100 UNIT/ML IV SOLN
500.0000 [IU] | Freq: Once | INTRAVENOUS | Status: AC | PRN
  Administered 2018-08-27: 500 [IU]

## 2018-08-27 MED ORDER — PEGFILGRASTIM 6 MG/0.6ML ~~LOC~~ PSKT
6.0000 mg | PREFILLED_SYRINGE | Freq: Once | SUBCUTANEOUS | Status: AC
  Administered 2018-08-27: 6 mg via SUBCUTANEOUS
  Filled 2018-08-27: qty 0.6

## 2018-08-27 MED ORDER — SODIUM CHLORIDE 0.9 % IV SOLN
Freq: Once | INTRAVENOUS | Status: AC
  Administered 2018-08-27: 13:00:00 via INTRAVENOUS

## 2018-08-27 MED ORDER — SODIUM CHLORIDE 0.9 % IV SOLN
Freq: Once | INTRAVENOUS | Status: AC
  Administered 2018-08-27: 14:00:00 via INTRAVENOUS
  Filled 2018-08-27: qty 2

## 2018-08-27 MED ORDER — SODIUM CHLORIDE 0.9% FLUSH
10.0000 mL | INTRAVENOUS | Status: DC | PRN
  Administered 2018-08-27: 10 mL
  Filled 2018-08-27: qty 10

## 2018-08-27 MED ORDER — TOPOTECAN HCL CHEMO INJECTION 4 MG
1.2000 mg/m2 | Freq: Once | INTRAVENOUS | Status: AC
  Administered 2018-08-27: 2.2 mg via INTRAVENOUS
  Filled 2018-08-27: qty 2.2

## 2018-08-27 NOTE — Progress Notes (Signed)
Patient tolerated chemotherapy with no complaints voiced.  Port site clean and dry with no bruising or swelling noted at site.  Good blood return noted before and after administration of chemotherapy.  Band aid applied.  Patient left ambulatory with VSS and no s/s of distress noted.  Neulasta Onpro applied with green indicator light flashing.  No alarms noted.

## 2018-08-27 NOTE — Patient Instructions (Signed)
[facility] Discharge Instructions for Patients Receiving Chemotherapy  Today you received the following chemotherapy agents  If you develop nausea and vomiting that is not controlled by your nausea medication, call the clinic.   BELOW ARE SYMPTOMS THAT SHOULD BE REPORTED IMMEDIATELY:  *FEVER GREATER THAN 100.5 F  *CHILLS WITH OR WITHOUT FEVER  NAUSEA AND VOMITING THAT IS NOT CONTROLLED WITH YOUR NAUSEA MEDICATION  *UNUSUAL SHORTNESS OF BREATH  *UNUSUAL BRUISING OR BLEEDING  TENDERNESS IN MOUTH AND THROAT WITH OR WITHOUT PRESENCE OF ULCERS  *URINARY PROBLEMS  *BOWEL PROBLEMS  UNUSUAL RASH Items with * indicate a potential emergency and should be followed up as soon as possible.  Feel free to call the clinic should you have any questions or concerns. The clinic phone number is [phone].  Please show the CHEMO ALERT CARD at check-in to the Emergency Department and triage nurse.   

## 2018-08-31 ENCOUNTER — Telehealth (HOSPITAL_COMMUNITY): Payer: Self-pay

## 2018-08-31 NOTE — Telephone Encounter (Signed)
Pt called [facility] with concerns about diarrhea since Saturday after taking a friend's laxative. Pt instructed to take Zofran at home and get Imodium OTC and take 2 tablets now. RLockamy NP consulted and VO received to have pt come in for labs tomorrow and IV fluids.

## 2018-09-01 ENCOUNTER — Other Ambulatory Visit (HOSPITAL_COMMUNITY): Payer: Medicare PPO

## 2018-09-01 ENCOUNTER — Encounter (HOSPITAL_COMMUNITY): Payer: Self-pay

## 2018-09-01 ENCOUNTER — Inpatient Hospital Stay (HOSPITAL_COMMUNITY): Payer: Medicare PPO

## 2018-09-01 DIAGNOSIS — C3490 Malignant neoplasm of unspecified part of unspecified bronchus or lung: Secondary | ICD-10-CM | POA: Diagnosis not present

## 2018-09-01 DIAGNOSIS — Z5111 Encounter for antineoplastic chemotherapy: Secondary | ICD-10-CM | POA: Diagnosis not present

## 2018-09-01 DIAGNOSIS — R197 Diarrhea, unspecified: Secondary | ICD-10-CM | POA: Diagnosis not present

## 2018-09-01 DIAGNOSIS — C787 Secondary malignant neoplasm of liver and intrahepatic bile duct: Secondary | ICD-10-CM | POA: Diagnosis not present

## 2018-09-01 LAB — CBC WITH DIFFERENTIAL/PLATELET
Abs Immature Granulocytes: 0.22 10*3/uL — ABNORMAL HIGH (ref 0.00–0.07)
Basophils Absolute: 0.1 10*3/uL (ref 0.0–0.1)
Basophils Relative: 1 %
Eosinophils Absolute: 0.1 10*3/uL (ref 0.0–0.5)
Eosinophils Relative: 0 %
HCT: 27.7 % — ABNORMAL LOW (ref 39.0–52.0)
Hemoglobin: 8.7 g/dL — ABNORMAL LOW (ref 13.0–17.0)
Immature Granulocytes: 2 %
Lymphocytes Relative: 13 %
Lymphs Abs: 1.7 10*3/uL (ref 0.7–4.0)
MCH: 30.3 pg (ref 26.0–34.0)
MCHC: 31.4 g/dL (ref 30.0–36.0)
MCV: 96.5 fL (ref 80.0–100.0)
Monocytes Absolute: 1.3 10*3/uL — ABNORMAL HIGH (ref 0.1–1.0)
Monocytes Relative: 10 %
Neutro Abs: 9.5 10*3/uL — ABNORMAL HIGH (ref 1.7–7.7)
Neutrophils Relative %: 74 %
Platelets: 88 10*3/uL — ABNORMAL LOW (ref 150–400)
RBC: 2.87 MIL/uL — ABNORMAL LOW (ref 4.22–5.81)
RDW: 21.5 % — ABNORMAL HIGH (ref 11.5–15.5)
WBC: 12.9 10*3/uL — ABNORMAL HIGH (ref 4.0–10.5)
nRBC: 0 % (ref 0.0–0.2)

## 2018-09-01 LAB — COMPREHENSIVE METABOLIC PANEL
ALT: 21 U/L (ref 0–44)
AST: 19 U/L (ref 15–41)
Albumin: 4.2 g/dL (ref 3.5–5.0)
Alkaline Phosphatase: 81 U/L (ref 38–126)
Anion gap: 8 (ref 5–15)
BUN: 31 mg/dL — ABNORMAL HIGH (ref 8–23)
CO2: 24 mmol/L (ref 22–32)
Calcium: 9 mg/dL (ref 8.9–10.3)
Chloride: 102 mmol/L (ref 98–111)
Creatinine, Ser: 0.9 mg/dL (ref 0.61–1.24)
GFR calc Af Amer: >60 mL/min (ref >60)
GFR calc non Af Amer: >60 mL/min (ref >60)
Glucose, Bld: 116 mg/dL — ABNORMAL HIGH (ref 70–99)
Potassium: 3.5 mmol/L (ref 3.5–5.1)
Sodium: 134 mmol/L — ABNORMAL LOW (ref 135–145)
Total Bilirubin: 0.6 mg/dL (ref 0.3–1.2)
Total Protein: 7 g/dL (ref 6.5–8.1)

## 2018-09-01 MED ORDER — HEPARIN SOD (PORK) LOCK FLUSH 100 UNIT/ML IV SOLN
500.0000 [IU] | Freq: Once | INTRAVENOUS | Status: AC
  Administered 2018-09-01: 500 [IU] via INTRAVENOUS

## 2018-09-01 MED ORDER — SODIUM CHLORIDE 0.9 % IV SOLN
Freq: Once | INTRAVENOUS | Status: AC
  Administered 2018-09-01: 14:00:00 via INTRAVENOUS

## 2018-09-01 MED ORDER — SODIUM CHLORIDE 0.9% FLUSH
10.0000 mL | INTRAVENOUS | Status: DC | PRN
  Administered 2018-09-01: 10 mL via INTRAVENOUS
  Filled 2018-09-01: qty 10

## 2018-09-01 NOTE — Progress Notes (Signed)
[name] tolerated hydration well without complaints or incident.Pt denied any further diarrhea episodes and labs reviewed with Francene Finders NP today and pt received 500 ml NS over 1 hr per NP orders. VSS Pt discharged self ambulatory in satisfactory condition accompanied by family member

## 2018-09-01 NOTE — Patient Instructions (Signed)
[place] at [facility] Discharge Instructions  Received hydration today. Follow-up as scheduled. Call clinic for any questions or concerns   Thank you for choosing [place] at [facility] to provide your oncology and hematology care.  To afford each patient quality time with our provider, please arrive at least 15 minutes before your scheduled appointment time.   If you have a lab appointment with the [facility] please come in thru the  Main Entrance and check in at the main information desk  You need to re-schedule your appointment should you arrive 10 or more minutes late.  We strive to give you quality time with our providers, and arriving late affects you and other patients whose appointments are after yours.  Also, if you no show three or more times for appointments you may be dismissed from the clinic at the providers discretion.     Again, thank you for choosing [facility].  Our hope is that these requests will decrease the amount of time that you wait before being seen by our physicians.       _____________________________________________________________  Should you have questions after your visit to [facility], please contact our office at [phone] between the hours of 8:00 a.m. and 4:30 p.m.  Voicemails left after 4:00 p.m. will not be returned until the following business day.  For prescription refill requests, have your pharmacy contact our office and allow 72 hours.    Cancer Center Support Programs:   > Cancer Support Group  2nd Tuesday of the month 1pm-2pm, Journey Room

## 2018-09-13 ENCOUNTER — Other Ambulatory Visit: Payer: Self-pay | Admitting: Family Medicine

## 2018-09-13 NOTE — Telephone Encounter (Signed)
Pt is requesting refill on Xanax   LOV: 06/01/18  LRF:   06/14/18

## 2018-09-16 ENCOUNTER — Encounter: Payer: Self-pay | Admitting: Family Medicine

## 2018-09-16 ENCOUNTER — Ambulatory Visit: Payer: Medicare PPO | Admitting: Family Medicine

## 2018-09-16 VITALS — BP 140/78 | HR 72 | Temp 98.3°F | Resp 16 | Ht 69.0 in | Wt 156.0 lb

## 2018-09-16 DIAGNOSIS — C3490 Malignant neoplasm of unspecified part of unspecified bronchus or lung: Secondary | ICD-10-CM | POA: Diagnosis not present

## 2018-09-16 DIAGNOSIS — I2510 Atherosclerotic heart disease of native coronary artery without angina pectoris: Secondary | ICD-10-CM | POA: Diagnosis not present

## 2018-09-16 DIAGNOSIS — E7800 Pure hypercholesterolemia, unspecified: Secondary | ICD-10-CM | POA: Diagnosis not present

## 2018-09-16 MED ORDER — TRAMADOL HCL 50 MG PO TABS: 50.0000 mg | ORAL_TABLET | Freq: Three times a day (TID) | ORAL | 0 refills | Status: DC
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
## 2018-09-17 LAB — LIPID PANEL
Cholesterol: 120 mg/dL (ref <200)
HDL: 41 mg/dL (ref >=40)
LDL Cholesterol (Calc): 60 mg/dL (calc)
Non-HDL Cholesterol (Calc): 79 mg/dL (calc) (ref <130)
Total CHOL/HDL Ratio: 2.9 (calc) (ref <5.0)
Triglycerides: 108 mg/dL (ref <150)

## 2018-09-17 LAB — CBC WITH DIFFERENTIAL/PLATELET
Absolute Monocytes: 697 cells/uL (ref 200–950)
Basophils Absolute: 32 cells/uL (ref 0–200)
Basophils Relative: 0.4 %
Eosinophils Absolute: 130 cells/uL (ref 15–500)
Eosinophils Relative: 1.6 %
HCT: 29.4 % — ABNORMAL LOW (ref 38.5–50.0)
Hemoglobin: 9.4 g/dL — ABNORMAL LOW (ref 13.2–17.1)
Lymphs Abs: 1644 cells/uL (ref 850–3900)
MCH: 30.9 pg (ref 27.0–33.0)
MCHC: 32 g/dL (ref 32.0–36.0)
MCV: 96.7 fL (ref 80.0–100.0)
MPV: 10.6 fL (ref 7.5–12.5)
Monocytes Relative: 8.6 %
Neutro Abs: 5597 cells/uL (ref 1500–7800)
Neutrophils Relative %: 69.1 %
Platelets: 438 10*3/uL — ABNORMAL HIGH (ref 140–400)
RBC: 3.04 10*6/uL — ABNORMAL LOW (ref 4.20–5.80)
RDW: 21.4 % — ABNORMAL HIGH (ref 11.0–15.0)
Total Lymphocyte: 20.3 %
WBC: 8.1 10*3/uL (ref 3.8–10.8)

## 2018-09-17 LAB — COMPLETE METABOLIC PANEL WITH GFR
AG Ratio: 1.8 (calc) (ref 1.0–2.5)
ALT: 14 U/L (ref 9–46)
AST: 16 U/L (ref 10–35)
Albumin: 4.1 g/dL (ref 3.6–5.1)
Alkaline phosphatase (APISO): 70 U/L (ref 35–144)
BUN: 17 mg/dL (ref 7–25)
CO2: 24 mmol/L (ref 20–32)
Calcium: 9.2 mg/dL (ref 8.6–10.3)
Chloride: 106 mmol/L (ref 98–110)
Creat: 0.89 mg/dL (ref 0.70–1.25)
GFR, Est African American: 103 mL/min/{1.73_m2} (ref >=60)
GFR, Est Non African American: 88 mL/min/{1.73_m2} (ref >=60)
Globulin: 2.3 g/dL (calc) (ref 1.9–3.7)
Glucose, Bld: 95 mg/dL (ref 65–99)
Potassium: 4.8 mmol/L (ref 3.5–5.3)
Sodium: 138 mmol/L (ref 135–146)
Total Bilirubin: 0.3 mg/dL (ref 0.2–1.2)
Total Protein: 6.4 g/dL (ref 6.1–8.1)

## 2018-09-20 ENCOUNTER — Inpatient Hospital Stay (HOSPITAL_COMMUNITY): Payer: Medicare PPO

## 2018-09-20 ENCOUNTER — Other Ambulatory Visit (HOSPITAL_COMMUNITY): Payer: Medicare PPO

## 2018-09-20 ENCOUNTER — Encounter (HOSPITAL_COMMUNITY): Payer: Self-pay | Admitting: Hematology

## 2018-09-20 ENCOUNTER — Other Ambulatory Visit: Payer: Self-pay

## 2018-09-20 ENCOUNTER — Inpatient Hospital Stay (HOSPITAL_COMMUNITY): Payer: Medicare PPO | Attending: Hematology | Admitting: Hematology

## 2018-09-20 VITALS — BP 130/52 | HR 57 | Temp 97.5°F | Resp 18

## 2018-09-20 DIAGNOSIS — Z5111 Encounter for antineoplastic chemotherapy: Secondary | ICD-10-CM | POA: Insufficient documentation

## 2018-09-20 DIAGNOSIS — D6481 Anemia due to antineoplastic chemotherapy: Secondary | ICD-10-CM

## 2018-09-20 DIAGNOSIS — F17210 Nicotine dependence, cigarettes, uncomplicated: Secondary | ICD-10-CM

## 2018-09-20 DIAGNOSIS — C3490 Malignant neoplasm of unspecified part of unspecified bronchus or lung: Secondary | ICD-10-CM | POA: Insufficient documentation

## 2018-09-20 DIAGNOSIS — G893 Neoplasm related pain (acute) (chronic): Secondary | ICD-10-CM

## 2018-09-20 DIAGNOSIS — G620 Drug-induced polyneuropathy: Secondary | ICD-10-CM

## 2018-09-20 DIAGNOSIS — C787 Secondary malignant neoplasm of liver and intrahepatic bile duct: Secondary | ICD-10-CM | POA: Diagnosis not present

## 2018-09-20 LAB — CBC WITH DIFFERENTIAL/PLATELET
Abs Immature Granulocytes: 0.03 10*3/uL (ref 0.00–0.07)
Basophils Absolute: 0 10*3/uL (ref 0.0–0.1)
Basophils Relative: 1 %
Eosinophils Absolute: 0.1 10*3/uL (ref 0.0–0.5)
Eosinophils Relative: 2 %
HCT: 29.2 % — ABNORMAL LOW (ref 39.0–52.0)
Hemoglobin: 8.9 g/dL — ABNORMAL LOW (ref 13.0–17.0)
Immature Granulocytes: 1 %
Lymphocytes Relative: 20 %
Lymphs Abs: 1.2 10*3/uL (ref 0.7–4.0)
MCH: 30.7 pg (ref 26.0–34.0)
MCHC: 30.5 g/dL (ref 30.0–36.0)
MCV: 100.7 fL — ABNORMAL HIGH (ref 80.0–100.0)
Monocytes Absolute: 0.8 10*3/uL (ref 0.1–1.0)
Monocytes Relative: 12 %
Neutro Abs: 4.1 10*3/uL (ref 1.7–7.7)
Neutrophils Relative %: 64 %
Platelets: 328 10*3/uL (ref 150–400)
RBC: 2.9 MIL/uL — ABNORMAL LOW (ref 4.22–5.81)
RDW: 23.3 % — ABNORMAL HIGH (ref 11.5–15.5)
WBC: 6.2 10*3/uL (ref 4.0–10.5)
nRBC: 0 % (ref 0.0–0.2)

## 2018-09-20 LAB — COMPREHENSIVE METABOLIC PANEL
ALT: 14 U/L (ref 0–44)
AST: 16 U/L (ref 15–41)
Albumin: 3.8 g/dL (ref 3.5–5.0)
Alkaline Phosphatase: 58 U/L (ref 38–126)
Anion gap: 5 (ref 5–15)
BUN: 15 mg/dL (ref 8–23)
CO2: 25 mmol/L (ref 22–32)
Calcium: 8.8 mg/dL — ABNORMAL LOW (ref 8.9–10.3)
Chloride: 109 mmol/L (ref 98–111)
Creatinine, Ser: 0.81 mg/dL (ref 0.61–1.24)
GFR calc Af Amer: >60 mL/min (ref >60)
GFR calc non Af Amer: >60 mL/min (ref >60)
Glucose, Bld: 99 mg/dL (ref 70–99)
Potassium: 4.2 mmol/L (ref 3.5–5.1)
Sodium: 139 mmol/L (ref 135–145)
Total Bilirubin: 0.4 mg/dL (ref 0.3–1.2)
Total Protein: 6.6 g/dL (ref 6.5–8.1)

## 2018-09-20 LAB — LACTATE DEHYDROGENASE: LDH: 125 U/L (ref 98–192)

## 2018-09-20 MED ORDER — SODIUM CHLORIDE 0.9 % IV SOLN
Freq: Once | INTRAVENOUS | Status: AC
  Administered 2018-09-20: 11:00:00 via INTRAVENOUS
  Filled 2018-09-20: qty 2

## 2018-09-20 MED ORDER — SODIUM CHLORIDE 0.9 % IV SOLN
Freq: Once | INTRAVENOUS | Status: AC
  Administered 2018-09-20: 10:00:00 via INTRAVENOUS

## 2018-09-20 MED ORDER — HEPARIN SOD (PORK) LOCK FLUSH 100 UNIT/ML IV SOLN
500.0000 [IU] | Freq: Once | INTRAVENOUS | Status: AC | PRN
  Administered 2018-09-20: 500 [IU]

## 2018-09-20 MED ORDER — TOPOTECAN HCL CHEMO INJECTION 4 MG
1.2000 mg/m2 | Freq: Once | INTRAVENOUS | Status: AC
  Administered 2018-09-20: 2.2 mg via INTRAVENOUS
  Filled 2018-09-20: qty 2.2

## 2018-09-20 MED ORDER — SODIUM CHLORIDE 0.9% FLUSH
10.0000 mL | INTRAVENOUS | Status: DC | PRN
  Administered 2018-09-20: 10 mL
  Filled 2018-09-20: qty 10

## 2018-09-20 NOTE — Progress Notes (Signed)
[place] reviewed with and pt seen by Dr. Delton Coombes and pt approved for chemo tx today per MD                                                                        Harvie Junior tolerated Topotecan infusion well without complaints or incident. VSS upon discharge. Pt discharged self ambulatory in satisfactory condition.

## 2018-09-20 NOTE — Assessment & Plan Note (Signed)
1.  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  - PET/CT scan on 08/13/2017 showing progression with bilateral lung nodules and mediastinal adenopathy -Topotecan (1.5 mg/m square) for 5 days every 21 days started on 08/24/2017.  -He gets brain MRI done in [place] periodically.  Last scan was negative for metastatic disease.  -Topotecan Dose reduced to 1.2 mg/m during cycle 4.  It was also increased to every 4 weeks for better tolerability. - CT CAP on 05/16/2018 showed previously identified right-sided pulmonary nodules stable, no new or progressive findings in the chest, abdomen and pelvis.  - Cycle 15 of chemotherapy on 08/23/2018.  He has tolerated cycle 14 on 07/26/2018 very well.  He has mild tiredness lasting for 1 to 2 days. - Physical exam and blood counts are adequate to proceed with cycle 16 today.  I plan to repeat CT CAP prior to next visit in 4 weeks. - He reported having constipation for 4 days after last cycle of chemotherapy.  He has been taking MiraLAX for 2 weeks with some help.  I have told him to add a stool softener to the current regimen.  2.  Normocytic anemia: -This is chemotherapy-induced.  Last Feraheme infusion was on 10/29/2017.  Hemoglobin today is 8.9.    3.  Peripheral neuropathy: -He will continue gabapentin 300 mg twice daily.  This is chemotherapy-induced.   4.  Cancer/chemotherapy related pains: -He is taking Percocet 5 mg 1 tablet daily.  He reports that back pain is not well controlled. -I have asked him to increase to 1 and half tablets daily.

## 2018-09-20 NOTE — Progress Notes (Signed)
[place], [place] [address]   CLINIC:  Medical Oncology/Hematology  PCP:  [name], MD [address] [facility] [place] [id number] [phone]   REASON FOR VISIT: Follow-up for extensive stage small cell lung cancer.  CURRENT THERAPY:Topotecan every 4 weeks.   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 Bulky 5.4 cm right supraclavicular region malignant lymph node conglomeration with extracapsular extension. 2. Surrounding smaller abnormal right level 3 and level 5 lymph nodes, and the lymphadenopathy continues into the superior mediastinum, see Chest CT findings reported separately. 3. No other metastatic disease identified in the neck.    01/16/2017 Imaging    CT chest: IMPRESSION: 1. Extensive lymphadenopathy in the thorax and lower right cervical region, as discussed above. Primary differential considerations include lymphoma/leukemia or small cell carcinoma of the lung. Further evaluation a PET-CT could be considered to assess for additional sites of disease below the diaphragm if clinically appropriate. Additionally, ultrasound-guided biopsy of supraclavicular lymphadenopathy could be considered to establish a tissue diagnosis. 2. Indeterminate lesion in the periphery of segment 8 of the liver measuring 2.7 x 1.7 cm. Attention at time of follow-up PET-CT is recommended. 3. Aortic atherosclerosis, in addition to left main and 3 vessel coronary artery disease. Please note that although the presence of coronary artery calcium documents the presence of coronary artery disease, the severity of this disease and any potential stenosis cannot be assessed on this non-gated CT examination. Assessment for potential risk factor modification, dietary therapy or pharmacologic therapy may be warranted, if clinically indicated. 4. There  are calcifications of the aortic valve. Echocardiographic correlation for evaluation of potential valvular dysfunction may be warranted if clinically indicated. 5. Diffuse bronchial wall thickening with moderate centrilobular and paraseptal emphysema; imaging findings suggestive of underlying COPD.    02/03/2017 Initial Biopsy    (R) neck lymph node biopsy: SMALL CELL CARCINOMA (most likely lung primary).     02/03/2017 Miscellaneous    Port-a-cath attempted by IR; unable to place d/t enlarged SVC.     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 Staging for metastatic disease  EXAM: MRI HEAD WITHOUT AND WITH CONTRAST  TECHNIQUE: Multiplanar, multiecho pulse sequences of the brain and surrounding structures were obtained without and with intravenous contrast.  CONTRAST:  49m MULTIHANCE GADOBENATE DIMEGLUMINE 529 MG/ML IV SOLN  COMPARISON:  None.  FINDINGS: Brain: Negative for hydrocephalus. Cerebral volume normal for age. Small nonenhancing white matter hyperintensities consistent with mild chronic microvascular ischemia. No acute infarct. Negative for hemorrhage or mass or edema  Normal enhancement postcontrast infusion. No enhancing mass lesion. Leptomeningeal enhancement is normal.  Vascular: Normal arterial flow voids.  Normal venous enhancement  Skull and upper cervical spine: Negative  Sinuses/Orbits: Negative  Other: None  IMPRESSION: Negative for metastatic disease.  No acute abnormality.  Mild chronic white matter changes.    04/07/2017 Imaging     PET:  1. Marked reduction in size and metabolic activity of bulky RIGHT supraclavicular adenopathy mediastinal lymphadenopathy. 2. Residual moderate activity remains within small  RIGHT supraclavicular lymph node, RIGHT lower paratracheal lymph node and RIGHT hilar  lymph node. 3. Resolution of prevascular and internal mammary mediastinal metastatic hypermetabolic activity. 4. Resolution of metabolic activity associated with solitary RIGHT hepatic lobe liver metastasis. 5. No evidence of disease progression. 6. 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    05/27/2017 Imaging    MRI brain w/ and w/o contrast IMPRESSION: 1. No metastatic disease identified. 2. Increased nonspecific cerebral white matter signal changes since August. These are most commonly small vessel disease related. 3. New right maxillary sinusitis. Benign appearing retention cysts in the nasopharynx with trace mastoid effusions.    06/12/2017 Imaging    PET-CT IMPRESSION: 1. There are two new hypermetabolic nodules identified within both lower lobes measuring up to 3.1 cm. The appearance is nonspecific and may be inflammatory/infectious in etiology. Pulmonary metastatic disease cannot be excluded and short-term follow-up imaging in 3 months is advised to reassess these nodules. 2. Stable appearance of mild hypermetabolic activity associated with right paratracheal and right hilar lymph nodes. 3. Decrease in FDG uptake associated with index right supraclavicular lymph node. 4. 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 5. Aortic Atherosclerosis (ICD10-I70.0) and Emphysema (ICD10-J43.9).    08/24/2017 -  Chemotherapy    The patient had pegfilgrastim (NEULASTA ONPRO KIT) injection 6 mg, 6 mg, Subcutaneous, Once, 16 of 19 cycles Administration: 6 mg (08/28/2017), 6 mg (09/18/2017), 6 mg (10/09/2017), 6 mg (11/02/2017), 6 mg (11/20/2017), 6 mg (12/18/2017), 6 mg (01/15/2018), 6 mg (02/12/2018), 6 mg (03/12/2018), 6 mg  (04/09/2018), 6 mg (05/07/2018), 6 mg (06/04/2018), 6 mg (07/02/2018), 6 mg (07/30/2018), 6 mg (08/27/2018) topotecan (HYCAMTIN) 2.9 mg in sodium chloride 0.9 % 100 mL chemo infusion, 1.5 mg/m2 = 2.9 mg, Intravenous,  Once, 16 of 19 cycles Dose modification: 1.2 mg/m2 (80 % of original dose 1.5 mg/m2, Cycle 4, Reason: Dose Not Tolerated) Administration: 2.9 mg (08/24/2017), 2.9 mg (08/25/2017), 2.9 mg (08/28/2017), 2.9 mg (08/26/2017), 2.9 mg (08/27/2017), 2.9 mg (09/14/2017), 2.9 mg (09/15/2017), 2.9 mg (09/16/2017), 2.9 mg (09/17/2017), 2.9 mg (09/18/2017), 2.9 mg (10/05/2017), 2.9 mg (10/06/2017), 2.9 mg (10/07/2017), 2.9 mg (10/08/2017), 2.9 mg (10/09/2017), 2.3 mg (10/26/2017), 2.3 mg (10/27/2017), 2.3 mg (10/28/2017), 2.3 mg (10/29/2017), 2.3 mg (11/02/2017), 2.3 mg (11/16/2017), 2.3 mg (11/17/2017), 2.3 mg (11/18/2017), 2.3 mg (11/19/2017), 2.3 mg (11/20/2017), 2.3 mg (12/14/2017), 2.3 mg (12/15/2017), 2.3 mg (12/16/2017), 2.3 mg (12/17/2017), 2.3 mg (12/18/2017), 2.3 mg (01/11/2018), 2.3 mg (01/12/2018), 2.3 mg (01/13/2018), 2.3 mg (01/15/2018), 2.3 mg (02/08/2018), 2.3 mg (02/09/2018), 2.3 mg (02/10/2018), 2.3 mg (02/11/2018), 2.3 mg (02/12/2018), 2.3 mg (03/08/2018), 2.3 mg (03/09/2018), 2.3 mg (03/10/2018), 2.3 mg (03/11/2018), 2.3 mg (03/12/2018), 2.2 mg (04/05/2018), 2.2 mg (04/06/2018), 2.2 mg (04/07/2018), 2.2 mg (04/08/2018), 2.2 mg (04/09/2018), 2.2 mg (05/03/2018), 2.2 mg (05/04/2018), 2.2 mg (05/05/2018), 2.2 mg (05/06/2018), 2.2 mg (05/07/2018), 2.2 mg (05/31/2018), 2.2 mg (06/01/2018), 2.2 mg (06/02/2018), 2.2 mg (06/03/2018), 2.2 mg (06/04/2018), 2.2 mg (06/28/2018), 2.2 mg (06/29/2018), 2.2 mg (06/30/2018), 2.2 mg (07/01/2018), 2.2 mg (07/02/2018), 2.2 mg (07/26/2018), 2.2 mg (07/27/2018), 2.2 mg (07/28/2018), 2.2 mg (07/29/2018), 2.2 mg (07/30/2018), 2.2 mg (08/23/2018), 2.2 mg (08/24/2018), 2.2 mg (08/25/2018), 2.2 mg (08/26/2018), 2.2 mg (08/27/2018), 2.2 mg (09/20/2018) ondansetron (ZOFRAN) 4 mg in sodium chloride 0.9 % 50 mL IVPB, , Intravenous,  Once, 11 of 14  cycles Administration:  (02/08/2018),  (02/09/2018),  (02/10/2018),  (02/11/2018),  (02/12/2018),  (03/08/2018),  (03/09/2018),  (03/10/2018),  (  03/11/2018),  (03/12/2018),  (04/05/2018),  (04/06/2018),  (04/07/2018),  (04/08/2018),  (04/09/2018),  (05/03/2018),  (05/04/2018),  (05/05/2018),  (05/06/2018),  (05/07/2018),  (05/31/2018),  (06/01/2018),  (06/02/2018),  (06/03/2018),  (06/04/2018),  (06/28/2018),  (06/29/2018),  (06/30/2018),  (07/01/2018),  (07/02/2018),  (07/26/2018),  (07/27/2018),  (07/28/2018),  (07/29/2018),  (07/30/2018),  (08/23/2018),  (08/24/2018),  (08/25/2018),  (08/26/2018),  (08/27/2018),  (09/20/2018)  for chemotherapy treatment.       INTERVAL HISTORY:  Mr. Molner 68 y.o. male returns for routine follow-up for extensive stage small cell lung cancer.He is here alone. He reports after his last treatment he came back for IV fluids. He also had problems with constipation. He is taking Miralax daily. He is still having problems so he was educated on taking a stool softener colace as well. Denies any nausea, vomiting, or diarrhea. Denies any new pains. Had not noticed any recent bleeding such as epistaxis, hematuria or hematochezia. Denies recent chest pain on exertion, shortness of breath on minimal exertion, pre-syncopal episodes, or palpitations. Denies any numbness or tingling in hands or feet. Denies any recent fevers, infections, or recent hospitalizations. Patient reports appetite at 50% and energy level at 75%.    REVIEW OF SYSTEMS:  Review of Systems  Constitutional: Positive for fatigue.  Respiratory: Positive for shortness of breath.   Gastrointestinal: Positive for constipation.  Neurological: Positive for numbness.  All other systems reviewed and are negative.    PAST MEDICAL/SURGICAL HISTORY:  Past Medical History:  Diagnosis Date  . Anxiety   . CAD (coronary artery disease)   . Cancer ([place])    stage 4 small cell lung cancer   . COPD (chronic obstructive pulmonary disease) ([place])    . Depression   . Dyspnea    increased exertion  . Feeling of chest tightness   . Heart palpitations   . History of chemotherapy   . Myocardial infarction ([place])   . Osteopenia   . Panic attacks   . Smoker    Past Surgical History:  Procedure Laterality Date  . BACK SURGERY  12/24/2000   L5,S1  . CORONARY STENT PLACEMENT  2005   RCA & CX  . HERNIA REPAIR Right 1980's  . INGUINAL HERNIA REPAIR  12/1978   right side  . IR FLUORO GUIDE PORT INSERTION RIGHT  04/02/2017  . IR US GUIDE BX ASP/DRAIN  02/03/2017  . IR US GUIDE VASC ACCESS RIGHT  04/02/2017  . NM MYOCAR PERF WALL MOTION  09/07/2009   No ischemia; EF 51%  . SHOULDER SURGERY Left 08/2010  . SPINE SURGERY  2002   L5-S1     SOCIAL HISTORY:  Social History   Socioeconomic History  . Marital status: Single    Spouse name: Not on file  . Number of children: Not on file  . Years of education: Not on file  . Highest education level: Not on file  Occupational History  . Not on file  Social Needs  . Financial resource strain: Not on file  . Food insecurity:    Worry: Not on file    Inability: Not on file  . Transportation needs:    Medical: Not on file    Non-medical: Not on file  Tobacco Use  . Smoking status: Current Every Day Smoker    Packs/day: 1.00    Types: Cigarettes  . Smokeless tobacco: Never Used  Substance and Sexual Activity  . Alcohol use: Yes    Comment: occas  . Drug use: No  .  Sexual activity: Not on file  Lifestyle  . Physical activity:    Days per week: Not on file    Minutes per session: Not on file  . Stress: Not on file  Relationships  . Social connections:    Talks on phone: Not on file    Gets together: Not on file    Attends religious service: Not on file    Active member of club or organization: Not on file    Attends meetings of clubs or organizations: Not on file    Relationship status: Not on file  . Intimate partner violence:    Fear of current or ex partner: Not on file      Emotionally abused: Not on file    Physically abused: Not on file    Forced sexual activity: Not on file  Other Topics Concern  . Not on file  Social History Narrative  . Not on file    FAMILY HISTORY:  Family History  Problem Relation Age of Onset  . Heart attack Father   . Kidney disease Father        renal failure  . Heart failure Mother   . Heart attack Mother   . Cancer Brother   . Diabetes Brother   . Alcohol abuse Brother   . Diabetes Sister     CURRENT MEDICATIONS:  Outpatient Encounter Medications as of 09/20/2018  Medication Sig  . ALPRAZolam (XANAX) 1 MG tablet TAKE (1) TABLET BY MOUTH (4) TIMES DAILY.  Marland Kitchen aspirin EC 81 MG tablet Take 81 mg by mouth daily.  Marland Kitchen atorvastatin (LIPITOR) 40 MG tablet TAKE (1) TABLET BY MOUTH ONCE DAILY.  . clotrimazole-betamethasone (LOTRISONE) cream Apply 1 application topically 2 (two) times daily.  . cyclobenzaprine (FLEXERIL) 5 MG tablet TAKE 1 TABLET THREE TIMES DAILY AS NEEDED FOR MUSCLE SPASMS. (Patient not taking: Reported on 09/16/2018)  . gabapentin (NEURONTIN) 300 MG capsule Take 1 capsule (300 mg total) by mouth 2 (two) times daily.  Marland Kitchen lidocaine-prilocaine (EMLA) cream Apply to affected area once  . ondansetron (ZOFRAN) 8 MG tablet Take 1 tablet (8 mg total) by mouth 2 (two) times daily as needed. Start on the third day after cisplatin chemotherapy.  Marland Kitchen oxyCODONE-acetaminophen (PERCOCET/ROXICET) 5-325 MG tablet Take 1 tablet by mouth 2 (two) times daily as needed for severe pain.  Marland Kitchen Pegfilgrastim (NEULASTA ONPRO [place]) Inject into the skin. Every 21 days  . polyethylene glycol powder (GLYCOLAX/MIRALAX) powder Take 17 g by mouth daily.  . prochlorperazine (COMPAZINE) 10 MG tablet Take 1 tablet (10 mg total) by mouth every 6 (six) hours as needed (Nausea or vomiting).  . TOPOTECAN HCL IV Inject into the vein.  Marland Kitchen topotecan in sodium chloride 0.9 % 100 mL Inject into the vein once. Days 1-5 every 21 days  . traMADol (ULTRAM) 50 MG  tablet Take 1 tablet (50 mg total) by mouth 3 (three) times daily.   No facility-administered encounter medications on file as of 09/20/2018.     ALLERGIES:  Allergies  Allergen Reactions  . Codeine Nausea Only  . Niaspan [Niacin Er]      PHYSICAL EXAM:  ECOG Performance status: 1  Vitals:   09/20/18 0914  BP: (!) 128/48  Pulse: 68  Resp: 18  Temp: 97.8 F (36.6 C)  SpO2: 96%   Filed Weights   09/20/18 0914  Weight: 153 lb (69.4 kg)    Physical Exam Constitutional:      Appearance: Normal appearance. He is normal weight.  Cardiovascular:     Rate and Rhythm: Normal rate and regular rhythm.     Heart sounds: Normal heart sounds.  Pulmonary:     Effort: Pulmonary effort is normal.     Breath sounds: Normal breath sounds.  Musculoskeletal: Normal range of motion.  Skin:    General: Skin is warm and dry.  Neurological:     Mental Status: He is alert and oriented to person, place, and time. Mental status is at baseline.  Psychiatric:        Mood and Affect: Mood normal.        Behavior: Behavior normal.        Thought Content: Thought content normal.        Judgment: Judgment normal.      LABORATORY DATA:  I have reviewed the labs as listed.  CBC    Component Value Date/Time   WBC 6.2 09/20/2018 0854   RBC 2.90 (L) 09/20/2018 0854   HGB 8.9 (L) 09/20/2018 0854   HCT 29.2 (L) 09/20/2018 0854   PLT 328 09/20/2018 0854   MCV 100.7 (H) 09/20/2018 0854   MCH 30.7 09/20/2018 0854   MCHC 30.5 09/20/2018 0854   RDW 23.3 (H) 09/20/2018 0854   LYMPHSABS 1.2 09/20/2018 0854   MONOABS 0.8 09/20/2018 0854   EOSABS 0.1 09/20/2018 0854   BASOSABS 0.0 09/20/2018 0854   CMP Latest Ref Rng & Units 09/20/2018 09/16/2018 09/01/2018  Glucose 70 - 99 mg/dL 99 95 116(H)  BUN 8 - 23 mg/dL 15 17 31(H)  Creatinine 0.61 - 1.24 mg/dL 0.81 0.89 0.90  Sodium 135 - 145 mmol/L 139 138 134(L)  Potassium 3.5 - 5.1 mmol/L 4.2 4.8 3.5  Chloride 98 - 111 mmol/L 109 106 102  CO2 22 - 32  mmol/L _0 Calcium 8.9 - 10.3 mg/dL 8.8(L) 9.2 9.0  Total Protein 6.5 - 8.1 g/dL 6.6 6.4 7.0  Total Bilirubin 0.3 - 1.2 mg/dL 0.4 0.3 0.6  Alkaline Phos 38 - 126 U/L 58 - 81  AST 15 - 41 U/L _1 ALT 0 - 44 U/L _2 DIAGNOSTIC IMAGING:  I have independently reviewed the scans and discussed with the patient.   I have reviewed Francene Finders, NP's note and agree with the documentation.  I personally performed a face-to-face visit, made revisions and my assessment and plan is as follows.    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  - PET/CT scan on 08/13/2017 showing progression with bilateral lung nodules and mediastinal adenopathy -Topotecan (1.5 mg/m square) for 5 days every 21 days started on 08/24/2017.  -He gets brain MRI done in [place] periodically.  Last scan was negative for metastatic disease.  -Topotecan Dose reduced to 1.2 mg/m during cycle 4.  It was also increased to every 4 weeks for better tolerability. - CT CAP on 05/16/2018 showed previously identified right-sided pulmonary nodules stable, no new or progressive findings in the chest, abdomen and pelvis.  - Cycle 15 of chemotherapy on 08/23/2018.  He has tolerated cycle 14 on 07/26/2018 very well.  He has mild tiredness lasting for 1 to 2 days. - Physical exam and blood counts are adequate to proceed with cycle 16 today.  I plan to repeat CT CAP prior to next visit in 4 weeks. -  He reported having constipation for 4 days after last cycle of chemotherapy.  He has been taking MiraLAX for 2 weeks with some help.  I have told him to add a stool softener to the current regimen.  2.  Normocytic anemia: -This is chemotherapy-induced.  Last Feraheme infusion was on 10/29/2017.  Hemoglobin today is 8.9.    3.  Peripheral neuropathy: -He will continue gabapentin 300  mg twice daily.  This is chemotherapy-induced.   4.  Cancer/chemotherapy related pains: -He is taking Percocet 5 mg 1 tablet daily.  He reports that back pain is not well controlled. -I have asked him to increase to 1 and half tablets daily.       Orders placed this encounter:  No orders of the defined types were placed in this encounter.     Derek Jack, MD [place] [phone]

## 2018-09-20 NOTE — Patient Instructions (Signed)
[facility] at [facility] Discharge Instructions  Follow up in 4 weeks with labs    Thank you for choosing [facility] at [facility] to provide your oncology and hematology care.  To afford each patient quality time with our provider, please arrive at least 15 minutes before your scheduled appointment time.   If you have a lab appointment with the Cancer Center please come in thru the  Main Entrance and check in at the main information desk  You need to re-schedule your appointment should you arrive 10 or more minutes late.  We strive to give you quality time with our providers, and arriving late affects you and other patients whose appointments are after yours.  Also, if you no show three or more times for appointments you may be dismissed from the clinic at the providers discretion.     Again, thank you for choosing [facility].  Our hope is that these requests will decrease the amount of time that you wait before being seen by our physicians.       _____________________________________________________________  Should you have questions after your visit to [facility], please contact our office at [phone] between the hours of 8:00 a.m. and 4:30 p.m.  Voicemails left after 4:00 p.m. will not be returned until the following business day.  For prescription refill requests, have your pharmacy contact our office and allow 72 hours.    Cancer Center Support Programs:   > Cancer Support Group  2nd Tuesday of the month 1pm-2pm, Journey Room    

## 2018-09-20 NOTE — Patient Instructions (Signed)
[facility] Discharge Instructions for Patients Receiving Chemotherapy   Beginning January 23rd 2017 lab work for the [facility] will be done in the  Main lab at [facility] on 1st floor. If you have a lab appointment with the [facility] please come in thru the  Main Entrance and check in at the main information desk   Today you received the following chemotherapy agents Topotecan. Follow-up as scheduled. Call clinic for any questions or concerns  To help prevent nausea and vomiting after your treatment, we encourage you to take your nausea medication   If you develop nausea and vomiting, or diarrhea that is not controlled by your medication, call the clinic.  The clinic phone number is [phone]. Office hours are Monday-Friday 8:30am-5:00pm.  BELOW ARE SYMPTOMS THAT SHOULD BE REPORTED IMMEDIATELY:  *FEVER GREATER THAN 101.0 F  *CHILLS WITH OR WITHOUT FEVER  NAUSEA AND VOMITING THAT IS NOT CONTROLLED WITH YOUR NAUSEA MEDICATION  *UNUSUAL SHORTNESS OF BREATH  *UNUSUAL BRUISING OR BLEEDING  TENDERNESS IN MOUTH AND THROAT WITH OR WITHOUT PRESENCE OF ULCERS  *URINARY PROBLEMS  *BOWEL PROBLEMS  UNUSUAL RASH Items with * indicate a potential emergency and should be followed up as soon as possible. If you have an emergency after office hours please contact your primary care physician or go to the nearest emergency department.  Please call the clinic during office hours if you have any questions or concerns.   You may also contact the Patient Navigator at [phone] should you have any questions or need assistance in obtaining follow up care.      Resources For Cancer Patients and their Caregivers ? American Cancer Society: Can assist with transportation, wigs, general needs, runs Look Good Feel Better.        317-045-9847 ? Cancer Care: Provides financial assistance, online support groups, medication/co-pay assistance.  1-800-813-HOPE  307-521-7538) ? [facility] Assists [place] cancer patients and their families through emotional , educational and financial support.  (717) 858-9281 ? [place] DSS Where to apply for food stamps, Medicaid and utility assistance. 212-527-1458 ? RCATS: Transportation to medical appointments. 310-067-2112 ? Social Security Administration: May apply for disability if have a Stage IV cancer. 906-620-5677 617-274-2047 ? [employer], Disability and Transit Services: Assists with nutrition, care and transit needs. 757-451-2395

## 2018-09-21 ENCOUNTER — Encounter (HOSPITAL_COMMUNITY): Payer: Self-pay

## 2018-09-21 ENCOUNTER — Inpatient Hospital Stay (HOSPITAL_COMMUNITY): Payer: Medicare PPO

## 2018-09-21 VITALS — BP 120/53 | HR 58 | Temp 97.5°F | Resp 18

## 2018-09-21 DIAGNOSIS — C3490 Malignant neoplasm of unspecified part of unspecified bronchus or lung: Secondary | ICD-10-CM | POA: Diagnosis not present

## 2018-09-21 DIAGNOSIS — Z5111 Encounter for antineoplastic chemotherapy: Secondary | ICD-10-CM | POA: Diagnosis not present

## 2018-09-21 DIAGNOSIS — C787 Secondary malignant neoplasm of liver and intrahepatic bile duct: Secondary | ICD-10-CM | POA: Diagnosis not present

## 2018-09-21 MED ORDER — HEPARIN SOD (PORK) LOCK FLUSH 100 UNIT/ML IV SOLN
500.0000 [IU] | Freq: Once | INTRAVENOUS | Status: AC | PRN
  Administered 2018-09-21: 500 [IU]

## 2018-09-21 MED ORDER — SODIUM CHLORIDE 0.9 % IV SOLN
Freq: Once | INTRAVENOUS | Status: AC
  Administered 2018-09-21: 11:00:00 via INTRAVENOUS

## 2018-09-21 MED ORDER — SODIUM CHLORIDE 0.9% FLUSH
10.0000 mL | INTRAVENOUS | Status: DC | PRN
  Administered 2018-09-21: 10 mL
  Filled 2018-09-21: qty 10

## 2018-09-21 MED ORDER — TOPOTECAN HCL CHEMO INJECTION 4 MG
1.2000 mg/m2 | Freq: Once | INTRAVENOUS | Status: AC
  Administered 2018-09-21: 2.2 mg via INTRAVENOUS
  Filled 2018-09-21: qty 2.2

## 2018-09-21 MED ORDER — SODIUM CHLORIDE 0.9 % IV SOLN
Freq: Once | INTRAVENOUS | Status: AC
  Administered 2018-09-21: 11:00:00 via INTRAVENOUS
  Filled 2018-09-21: qty 2

## 2018-09-21 NOTE — Patient Instructions (Signed)
[facility] Discharge Instructions for Patients Receiving Chemotherapy  Today you received the following chemotherapy agents  If you develop nausea and vomiting that is not controlled by your nausea medication, call the clinic.   BELOW ARE SYMPTOMS THAT SHOULD BE REPORTED IMMEDIATELY:  *FEVER GREATER THAN 100.5 F  *CHILLS WITH OR WITHOUT FEVER  NAUSEA AND VOMITING THAT IS NOT CONTROLLED WITH YOUR NAUSEA MEDICATION  *UNUSUAL SHORTNESS OF BREATH  *UNUSUAL BRUISING OR BLEEDING  TENDERNESS IN MOUTH AND THROAT WITH OR WITHOUT PRESENCE OF ULCERS  *URINARY PROBLEMS  *BOWEL PROBLEMS  UNUSUAL RASH Items with * indicate a potential emergency and should be followed up as soon as possible.  Feel free to call the clinic should you have any questions or concerns. The clinic phone number is [phone].  Please show the CHEMO ALERT CARD at check-in to the Emergency Department and triage nurse.   

## 2018-09-21 NOTE — Progress Notes (Signed)
To treatment with no complaints voiced today.  No s/s of distress noted.    Patient tolerated chemotherapy with no complaints voiced.  Port site clean and dry with no bruising or swelling noted at site.  Good blood return noted before and after administration of chemotherapy.  Tegaderm dressing intact.   Patient left ambulatory with VSS and no s/s of distress noted.

## 2018-09-22 ENCOUNTER — Other Ambulatory Visit: Payer: Self-pay

## 2018-09-22 ENCOUNTER — Inpatient Hospital Stay (HOSPITAL_COMMUNITY): Payer: Medicare PPO

## 2018-09-22 ENCOUNTER — Encounter (HOSPITAL_COMMUNITY): Payer: Self-pay

## 2018-09-22 VITALS — BP 115/51 | HR 57 | Temp 97.7°F | Resp 18 | Wt 154.4 lb

## 2018-09-22 DIAGNOSIS — Z5111 Encounter for antineoplastic chemotherapy: Secondary | ICD-10-CM | POA: Diagnosis not present

## 2018-09-22 DIAGNOSIS — C787 Secondary malignant neoplasm of liver and intrahepatic bile duct: Secondary | ICD-10-CM | POA: Diagnosis not present

## 2018-09-22 DIAGNOSIS — C3490 Malignant neoplasm of unspecified part of unspecified bronchus or lung: Secondary | ICD-10-CM | POA: Diagnosis not present

## 2018-09-22 MED ORDER — SODIUM CHLORIDE 0.9 % IV SOLN
Freq: Once | INTRAVENOUS | Status: AC
  Administered 2018-09-22: 11:00:00 via INTRAVENOUS
  Filled 2018-09-22: qty 2

## 2018-09-22 MED ORDER — SODIUM CHLORIDE 0.9 % IV SOLN
Freq: Once | INTRAVENOUS | Status: AC
  Administered 2018-09-22: 11:00:00 via INTRAVENOUS

## 2018-09-22 MED ORDER — SODIUM CHLORIDE 0.9% FLUSH
10.0000 mL | INTRAVENOUS | Status: DC | PRN
  Administered 2018-09-22: 10 mL
  Filled 2018-09-22: qty 10

## 2018-09-22 MED ORDER — TOPOTECAN HCL CHEMO INJECTION 4 MG
1.2000 mg/m2 | Freq: Once | INTRAVENOUS | Status: AC
  Administered 2018-09-22: 2.2 mg via INTRAVENOUS
  Filled 2018-09-22: qty 2.2

## 2018-09-22 MED ORDER — HEPARIN SOD (PORK) LOCK FLUSH 100 UNIT/ML IV SOLN
500.0000 [IU] | Freq: Once | INTRAVENOUS | Status: AC | PRN
  Administered 2018-09-22: 500 [IU]

## 2018-09-22 NOTE — Patient Instructions (Signed)
[facility] Discharge Instructions for Patients Receiving Chemotherapy  Today you received the following chemotherapy agents   To help prevent nausea and vomiting after your treatment, we encourage you to take your nausea medication   If you develop nausea and vomiting that is not controlled by your nausea medication, call the clinic.   BELOW ARE SYMPTOMS THAT SHOULD BE REPORTED IMMEDIATELY:  *FEVER GREATER THAN 100.5 F  *CHILLS WITH OR WITHOUT FEVER  NAUSEA AND VOMITING THAT IS NOT CONTROLLED WITH YOUR NAUSEA MEDICATION  *UNUSUAL SHORTNESS OF BREATH  *UNUSUAL BRUISING OR BLEEDING  TENDERNESS IN MOUTH AND THROAT WITH OR WITHOUT PRESENCE OF ULCERS  *URINARY PROBLEMS  *BOWEL PROBLEMS  UNUSUAL RASH Items with * indicate a potential emergency and should be followed up as soon as possible.  Feel free to call the clinic should you have any questions or concerns. The clinic phone number is [phone].  Please show the CHEMO ALERT CARD at check-in to the Emergency Department and triage nurse.   

## 2018-09-22 NOTE — Progress Notes (Signed)
Pt presents today for Day 3 Topotecan. Vital signs within parameters. No complaints of any changes since yesterday.   Treatment given today per MD orders. Tolerated infusion without adverse affects. Vital signs stable. No complaints at this time. Discharged from clinic ambulatory. F/U with [facility] as scheduled.

## 2018-09-23 ENCOUNTER — Inpatient Hospital Stay (HOSPITAL_COMMUNITY): Payer: Medicare PPO

## 2018-09-23 VITALS — BP 121/52 | HR 57 | Temp 97.8°F | Resp 18 | Wt 153.2 lb

## 2018-09-23 DIAGNOSIS — C787 Secondary malignant neoplasm of liver and intrahepatic bile duct: Secondary | ICD-10-CM | POA: Diagnosis not present

## 2018-09-23 DIAGNOSIS — C3490 Malignant neoplasm of unspecified part of unspecified bronchus or lung: Secondary | ICD-10-CM

## 2018-09-23 DIAGNOSIS — Z5111 Encounter for antineoplastic chemotherapy: Secondary | ICD-10-CM | POA: Diagnosis not present

## 2018-09-23 MED ORDER — TOPOTECAN HCL CHEMO INJECTION 4 MG
1.2000 mg/m2 | Freq: Once | INTRAVENOUS | Status: AC
  Administered 2018-09-23: 2.2 mg via INTRAVENOUS
  Filled 2018-09-23: qty 2.2

## 2018-09-23 MED ORDER — SODIUM CHLORIDE 0.9% FLUSH
10.0000 mL | INTRAVENOUS | Status: DC | PRN
  Administered 2018-09-23: 10 mL
  Filled 2018-09-23: qty 10

## 2018-09-23 MED ORDER — SODIUM CHLORIDE 0.9 % IV SOLN
Freq: Once | INTRAVENOUS | Status: AC
  Administered 2018-09-23: 12:00:00 via INTRAVENOUS

## 2018-09-23 MED ORDER — SODIUM CHLORIDE 0.9 % IV SOLN
Freq: Once | INTRAVENOUS | Status: AC
  Administered 2018-09-23: 12:00:00 via INTRAVENOUS
  Filled 2018-09-23: qty 2

## 2018-09-23 MED ORDER — HEPARIN SOD (PORK) LOCK FLUSH 100 UNIT/ML IV SOLN
500.0000 [IU] | Freq: Once | INTRAVENOUS | Status: AC | PRN
  Administered 2018-09-23: 500 [IU]
  Filled 2018-09-23: qty 5

## 2018-09-23 NOTE — Patient Instructions (Signed)
[facility] Discharge Instructions for Patients Receiving Chemotherapy  Today you received the following chemotherapy agents   To help prevent nausea and vomiting after your treatment, we encourage you to take your nausea medication   If you develop nausea and vomiting that is not controlled by your nausea medication, call the clinic.   BELOW ARE SYMPTOMS THAT SHOULD BE REPORTED IMMEDIATELY:  *FEVER GREATER THAN 100.5 F  *CHILLS WITH OR WITHOUT FEVER  NAUSEA AND VOMITING THAT IS NOT CONTROLLED WITH YOUR NAUSEA MEDICATION  *UNUSUAL SHORTNESS OF BREATH  *UNUSUAL BRUISING OR BLEEDING  TENDERNESS IN MOUTH AND THROAT WITH OR WITHOUT PRESENCE OF ULCERS  *URINARY PROBLEMS  *BOWEL PROBLEMS  UNUSUAL RASH Items with * indicate a potential emergency and should be followed up as soon as possible.  Feel free to call the clinic should you have any questions or concerns. The clinic phone number is [phone].  Please show the CHEMO ALERT CARD at check-in to the Emergency Department and triage nurse.   

## 2018-09-23 NOTE — Progress Notes (Signed)
Patient here today for day 4 of treatment. No new issues today. Proceed as planned.  Treatment given per orders. Patient tolerated it well without problems. Vitals stable and discharged home from clinic ambulatory. Follow up as scheduled.

## 2018-09-24 ENCOUNTER — Other Ambulatory Visit: Payer: Self-pay

## 2018-09-24 ENCOUNTER — Inpatient Hospital Stay (HOSPITAL_COMMUNITY): Payer: Medicare PPO

## 2018-09-24 VITALS — BP 139/56 | HR 51 | Temp 97.7°F | Resp 18 | Wt 156.0 lb

## 2018-09-24 DIAGNOSIS — C3490 Malignant neoplasm of unspecified part of unspecified bronchus or lung: Secondary | ICD-10-CM | POA: Diagnosis not present

## 2018-09-24 DIAGNOSIS — C787 Secondary malignant neoplasm of liver and intrahepatic bile duct: Secondary | ICD-10-CM | POA: Diagnosis not present

## 2018-09-24 DIAGNOSIS — Z5111 Encounter for antineoplastic chemotherapy: Secondary | ICD-10-CM | POA: Diagnosis not present

## 2018-09-24 MED ORDER — SODIUM CHLORIDE 0.9 % IV SOLN
Freq: Once | INTRAVENOUS | Status: AC
  Administered 2018-09-24: 11:00:00 via INTRAVENOUS
  Filled 2018-09-24: qty 2

## 2018-09-24 MED ORDER — SODIUM CHLORIDE 0.9 % IV SOLN
Freq: Once | INTRAVENOUS | Status: AC
  Administered 2018-09-24: 11:00:00 via INTRAVENOUS

## 2018-09-24 MED ORDER — SODIUM CHLORIDE 0.9% FLUSH
10.0000 mL | INTRAVENOUS | Status: DC | PRN
  Administered 2018-09-24 (×2): 10 mL
  Filled 2018-09-24 (×2): qty 10

## 2018-09-24 MED ORDER — TOPOTECAN HCL CHEMO INJECTION 4 MG
1.2000 mg/m2 | Freq: Once | INTRAVENOUS | Status: AC
  Administered 2018-09-24: 2.2 mg via INTRAVENOUS
  Filled 2018-09-24: qty 2.2

## 2018-09-24 MED ORDER — PEGFILGRASTIM 6 MG/0.6ML ~~LOC~~ PSKT
6.0000 mg | PREFILLED_SYRINGE | Freq: Once | SUBCUTANEOUS | Status: AC
  Administered 2018-09-24: 6 mg via SUBCUTANEOUS
  Filled 2018-09-24: qty 0.6

## 2018-09-24 MED ORDER — HEPARIN SOD (PORK) LOCK FLUSH 100 UNIT/ML IV SOLN
500.0000 [IU] | Freq: Once | INTRAVENOUS | Status: AC | PRN
  Administered 2018-09-24: 500 [IU]
  Filled 2018-09-24: qty 5

## 2018-09-24 NOTE — Progress Notes (Signed)
Nutrition Follow-up: Patient with lung cancer with liver metastasis. Patient receiving chemotherapy    Met with patient during infusion.  Patient reports appetite is so-so.  "I still eat though because I know I have too."  Reports that he continues to make his own smoothies with pure protein powder, carnation instant breakfast and fruit.    Denies additional nutrition related problems at this time.   Medications: reviewed  Labs: reviewed  Anthropometrics:   Weight stable at 156 lb today (? If fluid related), 153 lb yesterday and in December.   NUTRITION DIAGNOSIS: Malnutrition improved   MALNUTRITION DIAGNOSIS: severe malnutrition improved   INTERVENTION:  Encouraged patient to continue smoothies made with protein supplement for added calories and protein Stressed keeping calories high to prevent weight loss.      MONITORING, EVALUATION, GOAL: weight trends, intake   NEXT VISIT: April 17 during infusion  Joli B. Zenia Resides, [place], [place] Registered Dietitian [phone] (pager)

## 2018-09-24 NOTE — Progress Notes (Signed)
Pt. Presents today for Day 5 Topotecan. VSS. Pt denies any changes since the last treatment.    Treatment given today per MD orders. Tolerated infusion without adverse affects. Vital signs stable. No complaints at this time. Discharged from clinic ambulatory. F/U with [facility] as scheduled.

## 2018-09-24 NOTE — Patient Instructions (Signed)
[facility] Discharge Instructions for Patients Receiving Chemotherapy  Today you received the following chemotherapy agents   To help prevent nausea and vomiting after your treatment, we encourage you to take your nausea medication   If you develop nausea and vomiting that is not controlled by your nausea medication, call the clinic.   BELOW ARE SYMPTOMS THAT SHOULD BE REPORTED IMMEDIATELY:  *FEVER GREATER THAN 100.5 F  *CHILLS WITH OR WITHOUT FEVER  NAUSEA AND VOMITING THAT IS NOT CONTROLLED WITH YOUR NAUSEA MEDICATION  *UNUSUAL SHORTNESS OF BREATH  *UNUSUAL BRUISING OR BLEEDING  TENDERNESS IN MOUTH AND THROAT WITH OR WITHOUT PRESENCE OF ULCERS  *URINARY PROBLEMS  *BOWEL PROBLEMS  UNUSUAL RASH Items with * indicate a potential emergency and should be followed up as soon as possible.  Feel free to call the clinic should you have any questions or concerns. The clinic phone number is [phone].  Please show the CHEMO ALERT CARD at check-in to the Emergency Department and triage nurse.   

## 2018-09-29 ENCOUNTER — Other Ambulatory Visit: Payer: Self-pay | Admitting: Family Medicine

## 2018-09-29 ENCOUNTER — Other Ambulatory Visit (HOSPITAL_COMMUNITY): Payer: Self-pay | Admitting: Nurse Practitioner

## 2018-09-29 DIAGNOSIS — C3490 Malignant neoplasm of unspecified part of unspecified bronchus or lung: Secondary | ICD-10-CM

## 2018-09-29 NOTE — Telephone Encounter (Signed)
Requesting refill    Tramadol  LOV: 09/16/18  LRF:   09/16/18

## 2018-10-12 ENCOUNTER — Other Ambulatory Visit: Payer: Self-pay | Admitting: Family Medicine

## 2018-10-12 ENCOUNTER — Other Ambulatory Visit (HOSPITAL_COMMUNITY): Payer: Self-pay | Admitting: Nurse Practitioner

## 2018-10-12 DIAGNOSIS — G620 Drug-induced polyneuropathy: Secondary | ICD-10-CM

## 2018-10-12 DIAGNOSIS — C3490 Malignant neoplasm of unspecified part of unspecified bronchus or lung: Secondary | ICD-10-CM

## 2018-10-12 DIAGNOSIS — T451X5A Adverse effect of antineoplastic and immunosuppressive drugs, initial encounter: Principal | ICD-10-CM

## 2018-10-13 NOTE — Telephone Encounter (Signed)
Pt is requesting refill on Xanax   LOV: 09/16/18  LRF: 09/13/18

## 2018-10-14 ENCOUNTER — Other Ambulatory Visit: Payer: Self-pay | Admitting: Family Medicine

## 2018-10-25 ENCOUNTER — Encounter (HOSPITAL_COMMUNITY): Payer: Self-pay

## 2018-10-25 ENCOUNTER — Inpatient Hospital Stay (HOSPITAL_COMMUNITY): Payer: Medicare PPO

## 2018-10-25 ENCOUNTER — Inpatient Hospital Stay (HOSPITAL_COMMUNITY): Payer: Medicare PPO | Attending: Hematology

## 2018-10-25 ENCOUNTER — Inpatient Hospital Stay (HOSPITAL_BASED_OUTPATIENT_CLINIC_OR_DEPARTMENT_OTHER): Payer: Medicare PPO | Admitting: Hematology

## 2018-10-25 ENCOUNTER — Encounter (HOSPITAL_COMMUNITY): Payer: Self-pay | Admitting: Hematology

## 2018-10-25 ENCOUNTER — Other Ambulatory Visit: Payer: Self-pay

## 2018-10-25 VITALS — BP 135/68 | HR 59 | Temp 97.6°F | Resp 18

## 2018-10-25 DIAGNOSIS — Z7982 Long term (current) use of aspirin: Secondary | ICD-10-CM | POA: Diagnosis not present

## 2018-10-25 DIAGNOSIS — I2510 Atherosclerotic heart disease of native coronary artery without angina pectoris: Secondary | ICD-10-CM | POA: Diagnosis not present

## 2018-10-25 DIAGNOSIS — Z79899 Other long term (current) drug therapy: Secondary | ICD-10-CM | POA: Insufficient documentation

## 2018-10-25 DIAGNOSIS — Z5111 Encounter for antineoplastic chemotherapy: Secondary | ICD-10-CM | POA: Insufficient documentation

## 2018-10-25 DIAGNOSIS — F17210 Nicotine dependence, cigarettes, uncomplicated: Secondary | ICD-10-CM | POA: Diagnosis not present

## 2018-10-25 DIAGNOSIS — D649 Anemia, unspecified: Secondary | ICD-10-CM | POA: Insufficient documentation

## 2018-10-25 DIAGNOSIS — G893 Neoplasm related pain (acute) (chronic): Secondary | ICD-10-CM | POA: Insufficient documentation

## 2018-10-25 DIAGNOSIS — C3490 Malignant neoplasm of unspecified part of unspecified bronchus or lung: Secondary | ICD-10-CM

## 2018-10-25 DIAGNOSIS — G629 Polyneuropathy, unspecified: Secondary | ICD-10-CM

## 2018-10-25 DIAGNOSIS — C787 Secondary malignant neoplasm of liver and intrahepatic bile duct: Secondary | ICD-10-CM | POA: Insufficient documentation

## 2018-10-25 DIAGNOSIS — F419 Anxiety disorder, unspecified: Secondary | ICD-10-CM | POA: Diagnosis not present

## 2018-10-25 LAB — CBC WITH DIFFERENTIAL/PLATELET
Abs Immature Granulocytes: 0.02 10*3/uL (ref 0.00–0.07)
Basophils Absolute: 0.1 10*3/uL (ref 0.0–0.1)
Basophils Relative: 1 %
Eosinophils Absolute: 0.1 10*3/uL (ref 0.0–0.5)
Eosinophils Relative: 1 %
HCT: 30.9 % — ABNORMAL LOW (ref 39.0–52.0)
Hemoglobin: 9.9 g/dL — ABNORMAL LOW (ref 13.0–17.0)
Immature Granulocytes: 0 %
Lymphocytes Relative: 18 %
Lymphs Abs: 1.2 10*3/uL (ref 0.7–4.0)
MCH: 31.1 pg (ref 26.0–34.0)
MCHC: 32 g/dL (ref 30.0–36.0)
MCV: 97.2 fL (ref 80.0–100.0)
Monocytes Absolute: 0.7 10*3/uL (ref 0.1–1.0)
Monocytes Relative: 10 %
Neutro Abs: 4.9 10*3/uL (ref 1.7–7.7)
Neutrophils Relative %: 70 %
Platelets: 245 10*3/uL (ref 150–400)
RBC: 3.18 MIL/uL — ABNORMAL LOW (ref 4.22–5.81)
RDW: 21.8 % — ABNORMAL HIGH (ref 11.5–15.5)
WBC: 7 10*3/uL (ref 4.0–10.5)
nRBC: 0 % (ref 0.0–0.2)

## 2018-10-25 LAB — COMPREHENSIVE METABOLIC PANEL
ALT: 13 U/L (ref 0–44)
AST: 18 U/L (ref 15–41)
Albumin: 4.1 g/dL (ref 3.5–5.0)
Alkaline Phosphatase: 58 U/L (ref 38–126)
Anion gap: 6 (ref 5–15)
BUN: 17 mg/dL (ref 8–23)
CO2: 24 mmol/L (ref 22–32)
Calcium: 8.9 mg/dL (ref 8.9–10.3)
Chloride: 107 mmol/L (ref 98–111)
Creatinine, Ser: 0.68 mg/dL (ref 0.61–1.24)
GFR calc Af Amer: >60 mL/min (ref >60)
GFR calc non Af Amer: >60 mL/min (ref >60)
Glucose, Bld: 98 mg/dL (ref 70–99)
Potassium: 3.8 mmol/L (ref 3.5–5.1)
Sodium: 137 mmol/L (ref 135–145)
Total Bilirubin: 0.9 mg/dL (ref 0.3–1.2)
Total Protein: 6.7 g/dL (ref 6.5–8.1)

## 2018-10-25 LAB — LACTATE DEHYDROGENASE: LDH: 131 U/L (ref 98–192)

## 2018-10-25 MED ORDER — HEPARIN SOD (PORK) LOCK FLUSH 100 UNIT/ML IV SOLN
500.0000 [IU] | Freq: Once | INTRAVENOUS | Status: AC | PRN
  Administered 2018-10-25: 500 [IU]

## 2018-10-25 MED ORDER — SODIUM CHLORIDE 0.9 % IV SOLN
Freq: Once | INTRAVENOUS | Status: AC
  Administered 2018-10-25: 11:00:00 via INTRAVENOUS

## 2018-10-25 MED ORDER — SODIUM CHLORIDE 0.9 % IV SOLN
Freq: Once | INTRAVENOUS | Status: AC
  Administered 2018-10-25: 11:00:00 via INTRAVENOUS
  Filled 2018-10-25: qty 2

## 2018-10-25 MED ORDER — SODIUM CHLORIDE 0.9% FLUSH
10.0000 mL | INTRAVENOUS | Status: DC | PRN
  Administered 2018-10-25: 10 mL
  Filled 2018-10-25: qty 10

## 2018-10-25 MED ORDER — TOPOTECAN HCL CHEMO INJECTION 4 MG
1.2000 mg/m2 | Freq: Once | INTRAVENOUS | Status: AC
  Administered 2018-10-25: 2.2 mg via INTRAVENOUS
  Filled 2018-10-25: qty 2.2

## 2018-10-25 NOTE — Progress Notes (Signed)
[address] [place], [place] [address]   CLINIC:  Medical Oncology/Hematology  PCP:  [name], MD [address] [facility] [id number] [phone]   REASON FOR VISIT:  Follow-up for extensive stage small cell lung cancer.  CURRENT THERAPY:Topotecan every 4 weeks.   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 Bulky 5.4 cm right supraclavicular region malignant lymph node conglomeration with extracapsular extension. 2. Surrounding smaller abnormal right level 3 and level 5 lymph nodes, and the lymphadenopathy continues into the superior mediastinum, see Chest CT findings reported separately. 3. No other metastatic disease identified in the neck.    01/16/2017 Imaging    CT chest: IMPRESSION: 1. Extensive lymphadenopathy in the thorax and lower right cervical region, as discussed above. Primary differential considerations include lymphoma/leukemia or small cell carcinoma of the lung. Further evaluation a PET-CT could be considered to assess for additional sites of disease below the diaphragm if clinically appropriate. Additionally, ultrasound-guided biopsy of supraclavicular lymphadenopathy could be considered to establish a tissue diagnosis. 2. Indeterminate lesion in the periphery of segment 8 of the liver measuring 2.7 x 1.7 cm. Attention at time of follow-up PET-CT is recommended. 3. Aortic atherosclerosis, in addition to left main and 3 vessel coronary artery disease. Please note that although the presence of coronary artery calcium documents the presence of coronary artery disease, the severity of this disease and any potential stenosis cannot be assessed on this non-gated CT examination. Assessment for potential risk factor modification, dietary therapy or pharmacologic therapy may be warranted, if clinically indicated. 4. There  are calcifications of the aortic valve. Echocardiographic correlation for evaluation of potential valvular dysfunction may be warranted if clinically indicated. 5. Diffuse bronchial wall thickening with moderate centrilobular and paraseptal emphysema; imaging findings suggestive of underlying COPD.    02/03/2017 Initial Biopsy    (R) neck lymph node biopsy: SMALL CELL CARCINOMA (most likely lung primary).     02/03/2017 Miscellaneous    Port-a-cath attempted by IR; unable to place d/t enlarged SVC.     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 Staging for metastatic disease  EXAM: MRI HEAD WITHOUT AND WITH CONTRAST  TECHNIQUE: Multiplanar, multiecho pulse sequences of the brain and surrounding structures were obtained without and with intravenous contrast.  CONTRAST:  57m MULTIHANCE GADOBENATE DIMEGLUMINE 529 MG/ML IV SOLN  COMPARISON:  None.  FINDINGS: Brain: Negative for hydrocephalus. Cerebral volume normal for age. Small nonenhancing white matter hyperintensities consistent with mild chronic microvascular ischemia. No acute infarct. Negative for hemorrhage or mass or edema  Normal enhancement postcontrast infusion. No enhancing mass lesion. Leptomeningeal enhancement is normal.  Vascular: Normal arterial flow voids.  Normal venous enhancement  Skull and upper cervical spine: Negative  Sinuses/Orbits: Negative  Other: None  IMPRESSION: Negative for metastatic disease.  No acute abnormality.  Mild chronic white matter changes.    04/07/2017 Imaging     PET:  1. Marked reduction in size and metabolic activity of bulky RIGHT supraclavicular adenopathy mediastinal lymphadenopathy. 2. Residual moderate activity remains within small  RIGHT supraclavicular lymph node, RIGHT lower paratracheal lymph node and  hilar lymph node. 3. Resolution of prevascular and internal mammary mediastinal metastatic hypermetabolic activity. 4. Resolution of metabolic activity associated with solitary RIGHT hepatic lobe liver metastasis. 5. No evidence of disease progression. 6. 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    05/27/2017 Imaging    MRI brain w/ and w/o contrast IMPRESSION: 1. No metastatic disease identified. 2. Increased nonspecific cerebral white matter signal changes since August. These are most commonly small vessel disease related. 3. New right maxillary sinusitis. Benign appearing retention cysts in the nasopharynx with trace mastoid effusions.    06/12/2017 Imaging    PET-CT IMPRESSION: 1. There are two new hypermetabolic nodules identified within both lower lobes measuring up to 3.1 cm. The appearance is nonspecific and may be inflammatory/infectious in etiology. Pulmonary metastatic disease cannot be excluded and short-term follow-up imaging in 3 months is advised to reassess these nodules. 2. Stable appearance of mild hypermetabolic activity associated with right paratracheal and right hilar lymph nodes. 3. Decrease in FDG uptake associated with index right supraclavicular lymph node. 4. 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 5. Aortic Atherosclerosis (ICD10-I70.0) and Emphysema (ICD10-J43.9).    08/24/2017 -  Chemotherapy    The patient had pegfilgrastim (NEULASTA ONPRO [place]) injection 6 mg, 6 mg, Subcutaneous, Once, 17 of 19 cycles Administration: 6 mg (08/28/2017), 6 mg (09/18/2017), 6 mg (10/09/2017), 6 mg (11/02/2017), 6 mg (11/20/2017), 6 mg (12/18/2017), 6 mg (01/15/2018), 6 mg (02/12/2018), 6 mg (03/12/2018), 6 mg  (04/09/2018), 6 mg (05/07/2018), 6 mg (06/04/2018), 6 mg (07/02/2018), 6 mg (07/30/2018), 6 mg (08/27/2018), 6 mg (09/24/2018) topotecan (HYCAMTIN) 2.9 mg in sodium chloride 0.9 % 100 mL chemo infusion, 1.5 mg/m2 = 2.9 mg, Intravenous,  Once, 17 of 19 cycles Dose modification: 1.2 mg/m2 (80 % of original dose 1.5 mg/m2, Cycle 4, Reason: Dose Not Tolerated) Administration: 2.9 mg (08/24/2017), 2.9 mg (08/25/2017), 2.9 mg (08/28/2017), 2.9 mg (08/26/2017), 2.9 mg (08/27/2017), 2.9 mg (09/14/2017), 2.9 mg (09/15/2017), 2.9 mg (09/16/2017), 2.9 mg (09/17/2017), 2.9 mg (09/18/2017), 2.9 mg (10/05/2017), 2.9 mg (10/06/2017), 2.9 mg (10/07/2017), 2.9 mg (10/08/2017), 2.9 mg (10/09/2017), 2.3 mg (10/26/2017), 2.3 mg (10/27/2017), 2.3 mg (10/28/2017), 2.3 mg (10/29/2017), 2.3 mg (11/02/2017), 2.3 mg (11/16/2017), 2.3 mg (11/17/2017), 2.3 mg (11/18/2017), 2.3 mg (11/19/2017), 2.3 mg (11/20/2017), 2.3 mg (12/14/2017), 2.3 mg (12/15/2017), 2.3 mg (12/16/2017), 2.3 mg (12/17/2017), 2.3 mg (12/18/2017), 2.3 mg (01/11/2018), 2.3 mg (01/12/2018), 2.3 mg (01/13/2018), 2.3 mg (01/15/2018), 2.3 mg (02/08/2018), 2.3 mg (02/09/2018), 2.3 mg (02/10/2018), 2.3 mg (02/11/2018), 2.3 mg (02/12/2018), 2.3 mg (03/08/2018), 2.3 mg (03/09/2018), 2.3 mg (03/10/2018), 2.3 mg (03/11/2018), 2.3 mg (03/12/2018), 2.2 mg (04/05/2018), 2.2 mg (04/06/2018), 2.2 mg (04/07/2018), 2.2 mg (04/08/2018), 2.2 mg (04/09/2018), 2.2 mg (05/03/2018), 2.2 mg (05/04/2018), 2.2 mg (05/05/2018), 2.2 mg (05/06/2018), 2.2 mg (05/07/2018), 2.2 mg (05/31/2018), 2.2 mg (06/01/2018), 2.2 mg (06/02/2018), 2.2 mg (06/03/2018), 2.2 mg (06/04/2018), 2.2 mg (06/28/2018), 2.2 mg (06/29/2018), 2.2 mg (06/30/2018), 2.2 mg (07/01/2018), 2.2 mg (07/02/2018), 2.2 mg (07/26/2018), 2.2 mg (07/27/2018), 2.2 mg (07/28/2018), 2.2 mg (07/29/2018), 2.2 mg (07/30/2018), 2.2 mg (08/23/2018), 2.2 mg (08/24/2018), 2.2 mg (08/25/2018), 2.2 mg (08/26/2018), 2.2 mg (08/27/2018), 2.2 mg (09/20/2018), 2.2 mg (09/21/2018), 2.2 mg (09/22/2018), 2.2 mg (09/23/2018), 2.2 mg (09/24/2018),  2.2 mg (10/25/2018) ondansetron (ZOFRAN) 4 mg in sodium chloride 0.9 % 50 mL IVPB, , Intravenous,  Once, 12 of 14  cycles Administration:  (02/08/2018),  (02/09/2018),  (02/10/2018),  (02/11/2018),  (02/12/2018),  (03/08/2018),  (03/09/2018),  (03/10/2018),  (03/11/2018),  (03/12/2018),  (04/05/2018),  (04/06/2018),  (04/07/2018),  (04/08/2018),  (04/09/2018),  (05/03/2018),  (05/04/2018),  (05/05/2018),  (05/06/2018),  (05/07/2018),  (05/31/2018),  (06/01/2018),  (06/02/2018),  (06/03/2018),  (06/04/2018),  (06/28/2018),  (06/29/2018),  (06/30/2018),  (07/01/2018),  (07/02/2018),  (07/26/2018),  (07/27/2018),  (07/28/2018),  (07/29/2018),  (07/30/2018),  (08/23/2018),  (08/24/2018),  (08/25/2018),  (08/26/2018),  (08/27/2018),  (09/20/2018),  (09/21/2018),  (09/22/2018),  (09/23/2018),  (09/24/2018),  (10/25/2018)  for chemotherapy treatment.       CANCER STAGING: Cancer Staging No matching staging information was found for the patient.   INTERVAL HISTORY:  [name] 68 y.o. male returns for routine follow-up and consideration for next cycle of chemotherapy. He is here today alone. He states that he has been doing well since his last treatment. He states that he does experience constipation. Denies any nausea, vomiting, or diarrhea. Denies any new pains. Had not noticed any recent bleeding such as epistaxis, hematuria or hematochezia. Denies recent chest pain on exertion, pre-syncopal episodes, or palpitations. Denies any numbness or tingling in hands. Denies any recent fevers, infections, or recent hospitalizations. Patient reports appetite at 25% and energy level at 50%.  REVIEW OF SYSTEMS:  Review of Systems  Respiratory: Positive for shortness of breath.   Gastrointestinal: Positive for constipation.  Neurological: Positive for numbness.  Psychiatric/Behavioral: Positive for sleep disturbance.     PAST MEDICAL/SURGICAL HISTORY:  Past Medical History:  Diagnosis Date   Anxiety    CAD (coronary artery disease)     Cancer (HCC)    stage 4 small cell lung cancer    COPD (chronic obstructive pulmonary disease) (HCC)    Depression    Dyspnea    increased exertion   Feeling of chest tightness    Heart palpitations    History of chemotherapy    Myocardial infarction ([place])    Osteopenia    Panic attacks    Smoker    Past Surgical History:  Procedure Laterality Date   BACK SURGERY  12/24/2000   L5,S1   CORONARY STENT PLACEMENT  2005   RCA & CX   HERNIA REPAIR Right 1980's   INGUINAL HERNIA REPAIR  12/1978   right side   IR FLUORO GUIDE PORT INSERTION RIGHT  04/02/2017   IR US GUIDE BX ASP/DRAIN  02/03/2017   IR US GUIDE VASC ACCESS RIGHT  04/02/2017   NM MYOCAR PERF WALL MOTION  09/07/2009   No ischemia; EF 51%   SHOULDER SURGERY Left 08/2010   SPINE SURGERY  2002   L5-S1     SOCIAL HISTORY:  Social History   Socioeconomic History   Marital status: Single    Spouse name: Not on file   Number of children: Not on file   Years of education: Not on file   Highest education level: Not on file  Occupational History   Not on file  Social Needs   Financial resource strain: Not on file   Food insecurity:    Worry: Not on file    Inability: Not on file   Transportation needs:    Medical: Not on file    Non-medical: Not on file  Tobacco Use   Smoking status: Current Every Day Smoker    Packs/day: 1.00    Types: Cigarettes   Smokeless tobacco: Never Used  Substance and Sexual Activity   Alcohol use: Yes  Comment: occas   Drug use: No   Sexual activity: Not on file  Lifestyle   Physical activity:    Days per week: Not on file    Minutes per session: Not on file   Stress: Not on file  Relationships   Social connections:    Talks on phone: Not on file    Gets together: Not on file    Attends religious service: Not on file    Active member of club or organization: Not on file    Attends meetings of clubs or organizations: Not on file     Relationship status: Not on file   Intimate partner violence:    Fear of current or ex partner: Not on file    Emotionally abused: Not on file    Physically abused: Not on file    Forced sexual activity: Not on file  Other Topics Concern   Not on file  Social History Narrative   Not on file    FAMILY HISTORY:  Family History  Problem Relation Age of Onset   Heart attack Father    Kidney disease Father        renal failure   Heart failure Mother    Heart attack Mother    Cancer Brother    Diabetes Brother    Alcohol abuse Brother    Diabetes Sister     CURRENT MEDICATIONS:  Outpatient Encounter Medications as of 10/25/2018  Medication Sig   ALPRAZolam (XANAX) 1 MG tablet TAKE (1) TABLET BY MOUTH (4) TIMES DAILY.   aspirin EC 81 MG tablet Take 81 mg by mouth daily.   atorvastatin (LIPITOR) 40 MG tablet TAKE (1) TABLET BY MOUTH ONCE DAILY.   clotrimazole-betamethasone (LOTRISONE) cream Apply 1 application topically 2 (two) times daily.   gabapentin (NEURONTIN) 300 MG capsule TAKE (1) CAPSULE BY MOUTH TWICE DAILY.   lidocaine-prilocaine (EMLA) cream Apply to affected area once   ondansetron (ZOFRAN) 8 MG tablet Take 1 tablet (8 mg total) by mouth 2 (two) times daily as needed. Start on the third day after cisplatin chemotherapy.   oxyCODONE-acetaminophen (PERCOCET/ROXICET) 5-325 MG tablet TAKE 1 TABLET TWICE DAILY AS NEEDED FOR SEVERE PAIN.   Pegfilgrastim (NEULASTA ONPRO [place]) Inject into the skin. Every 21 days   polyethylene glycol powder (GLYCOLAX/MIRALAX) powder Take 17 g by mouth daily.   prochlorperazine (COMPAZINE) 10 MG tablet Take 1 tablet (10 mg total) by mouth every 6 (six) hours as needed (Nausea or vomiting).   TOPOTECAN HCL IV Inject into the vein.   topotecan in sodium chloride 0.9 % 100 mL Inject into the vein once. Days 1-5 every 21 days   cyclobenzaprine (FLEXERIL) 5 MG tablet TAKE 1 TABLET THREE TIMES DAILY AS NEEDED FOR MUSCLE SPASMS.     traMADol (ULTRAM) 50 MG tablet TAKE (1) TABLET BY MOUTH (3) TIMES DAILY.   No facility-administered encounter medications on file as of 10/25/2018.     ALLERGIES:  Allergies  Allergen Reactions   Codeine Nausea Only   Niaspan [Niacin Er]      PHYSICAL EXAM:  ECOG Performance status: 1  Vitals:   10/25/18 0906  BP: 111/71  Pulse: 62  Resp: 19  Temp: 98.1 F (36.7 C)  SpO2: 100%   Filed Weights   10/25/18 0906  Weight: 150 lb (68 kg)    Physical Exam Vitals signs reviewed.  Constitutional:      Appearance: Normal appearance.  Cardiovascular:     Rate and Rhythm: Normal rate  and regular rhythm.     Heart sounds: Normal heart sounds.  Pulmonary:     Effort: Pulmonary effort is normal.     Breath sounds: Normal breath sounds.  Abdominal:     General: Abdomen is flat. There is no distension.     Palpations: Abdomen is soft. There is no mass.  Musculoskeletal:        General: No swelling.  Skin:    General: Skin is warm.  Neurological:     General: No focal deficit present.     Mental Status: He is alert and oriented to person, place, and time.  Psychiatric:        Mood and Affect: Mood normal.        Behavior: Behavior normal.      LABORATORY DATA:  I have reviewed the labs as listed.  CBC    Component Value Date/Time   WBC 7.0 10/25/2018 0850   RBC 3.18 (L) 10/25/2018 0850   HGB 9.9 (L) 10/25/2018 0850   HCT 30.9 (L) 10/25/2018 0850   PLT 245 10/25/2018 0850   MCV 97.2 10/25/2018 0850   MCH 31.1 10/25/2018 0850   MCHC 32.0 10/25/2018 0850   RDW 21.8 (H) 10/25/2018 0850   LYMPHSABS 1.2 10/25/2018 0850   MONOABS 0.7 10/25/2018 0850   EOSABS 0.1 10/25/2018 0850   BASOSABS 0.1 10/25/2018 0850   CMP Latest Ref Rng & Units 10/25/2018 09/20/2018 09/16/2018  Glucose 70 - 99 mg/dL 98 99 95  BUN 8 - 23 mg/dL _0 Creatinine 0.61 - 1.24 mg/dL 0.68 0.81 0.89  Sodium 135 - 145 mmol/L 137 139 138  Potassium 3.5 - 5.1 mmol/L 3.8 4.2 4.8  Chloride 98  - 111 mmol/L 107 109 106  CO2 22 - 32 mmol/L _1 Calcium 8.9 - 10.3 mg/dL 8.9 8.8(L) 9.2  Total Protein 6.5 - 8.1 g/dL 6.7 6.6 6.4  Total Bilirubin 0.3 - 1.2 mg/dL 0.9 0.4 0.3  Alkaline Phos 38 - 126 U/L 58 58 -  AST 15 - 41 U/L _2 ALT 0 - 44 U/L _3 DIAGNOSTIC IMAGING:  I have independently reviewed the scans and discussed with the patient.   I have reviewed Venita Lick LPN's note and agree with the documentation.  I personally performed a face-to-face visit, made revisions and my assessment and plan is as follows.    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  - PET/CT scan on 08/13/2017 showing progression with bilateral lung nodules and mediastinal adenopathy -Topotecan (1.5 mg/m square) for 5 days every 21 days started on 08/24/2017.  -He gets brain MRI done in [place] periodically.  Last scan was negative for metastatic disease.  -Topotecan Dose reduced to 1.2 mg/m during cycle 4.  It was also increased to every 4 weeks for better tolerability. - CT CAP on 05/16/2018 showed previously identified right-sided pulmonary nodules stable, no new or progressive findings in the chest, abdomen and pelvis.  - Last cycle of chemotherapy was on 09/20/2018.  He tolerated it pretty well. -He had mild fatigue and constipation issues.  He is taking stool softener and MiraLAX. - I reviewed his blood work.  They are adequate to proceed with his next cycle of chemotherapy. -I will see him back in 4 weeks  for follow-up.  I plan to repeat a CT CAP prior to next visit.  2.  Normocytic anemia: -This is chemotherapy-induced.  Last Feraheme infusion was on 10/29/2017. -Hemoglobin today is 9.9.  Patient is also taking iron tablet daily.   3.  Peripheral neuropathy: -We will continue gabapentin 300 mg twice daily.  4.   Cancer/chemotherapy related pains: -He will continue Percocet 5 mg 1 and half tablets daily.   Total time spent is 25 minutes with more than 50% of the time spent face-to-face discussing and reinforcing treatment options and side effects and coordination of care.    Orders placed this encounter:  No orders of the defined types were placed in this encounter.     Derek Jack, MD [place] [phone]

## 2018-10-25 NOTE — Patient Instructions (Signed)
[place] at [facility] Discharge Instructions  You were seen today by Dr. Delton Coombes. He went over your recent lab results. He will get you scheduled for scans prior to your next visit. He will see you back in 4 weeks for labs, treatment and follow up.   Thank you for choosing [place] at [facility] to provide your oncology and hematology care.  To afford each patient quality time with our provider, please arrive at least 15 minutes before your scheduled appointment time.   If you have a lab appointment with the [place] please come in thru the  Main Entrance and check in at the main information desk  You need to re-schedule your appointment should you arrive 10 or more minutes late.  We strive to give you quality time with our providers, and arriving late affects you and other patients whose appointments are after yours.  Also, if you no show three or more times for appointments you may be dismissed from the clinic at the providers discretion.     Again, thank you for choosing [facility].  Our hope is that these requests will decrease the amount of time that you wait before being seen by our physicians.       _____________________________________________________________  Should you have questions after your visit to [facility], please contact our office at [phone] between the hours of 8:00 a.m. and 4:30 p.m.  Voicemails left after 4:00 p.m. will not be returned until the following business day.  For prescription refill requests, have your pharmacy contact our office and allow 72 hours.    Cancer Center Support Programs:   > Cancer Support Group  2nd Tuesday of the month 1pm-2pm, Journey Room

## 2018-10-25 NOTE — Patient Instructions (Signed)
[facility] Discharge Instructions for Patients Receiving Chemotherapy  Today you received the following chemotherapy agents   To help prevent nausea and vomiting after your treatment, we encourage you to take your nausea medication   If you develop nausea and vomiting that is not controlled by your nausea medication, call the clinic.   BELOW ARE SYMPTOMS THAT SHOULD BE REPORTED IMMEDIATELY:  *FEVER GREATER THAN 100.5 F  *CHILLS WITH OR WITHOUT FEVER  NAUSEA AND VOMITING THAT IS NOT CONTROLLED WITH YOUR NAUSEA MEDICATION  *UNUSUAL SHORTNESS OF BREATH  *UNUSUAL BRUISING OR BLEEDING  TENDERNESS IN MOUTH AND THROAT WITH OR WITHOUT PRESENCE OF ULCERS  *URINARY PROBLEMS  *BOWEL PROBLEMS  UNUSUAL RASH Items with * indicate a potential emergency and should be followed up as soon as possible.  Feel free to call the clinic should you have any questions or concerns. The clinic phone number is [phone].  Please show the CHEMO ALERT CARD at check-in to the Emergency Department and triage nurse.   

## 2018-10-25 NOTE — Patient Instructions (Signed)
[facility] at [facility] Discharge Instructions  Labs drawn from portacath today   Thank you for choosing [facility] at [facility] to provide your oncology and hematology care.  To afford each patient quality time with our provider, please arrive at least 15 minutes before your scheduled appointment time.   If you have a lab appointment with the Cancer Center please come in thru the  Main Entrance and check in at the main information desk  You need to re-schedule your appointment should you arrive 10 or more minutes late.  We strive to give you quality time with our providers, and arriving late affects you and other patients whose appointments are after yours.  Also, if you no show three or more times for appointments you may be dismissed from the clinic at the providers discretion.     Again, thank you for choosing [facility].  Our hope is that these requests will decrease the amount of time that you wait before being seen by our physicians.       _____________________________________________________________  Should you have questions after your visit to [facility], please contact our office at [phone] between the hours of 8:00 a.m. and 4:30 p.m.  Voicemails left after 4:00 p.m. will not be returned until the following business day.  For prescription refill requests, have your pharmacy contact our office and allow 72 hours.    Cancer Center Support Programs:   > Cancer Support Group  2nd Tuesday of the month 1pm-2pm, Journey Room   

## 2018-10-25 NOTE — Progress Notes (Signed)
Pt seen by Dr. Delton Coombes today. VSS. Proceed with treatment V.O. received.   Treatment given today per MD orders. Tolerated infusion without adverse affects. Vital signs stable. No complaints at this time. Discharged from clinic ambulatory. F/U with [facility] as scheduled.

## 2018-10-25 NOTE — Assessment & Plan Note (Signed)
1.  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  - PET/CT scan on 08/13/2017 showing progression with bilateral lung nodules and mediastinal adenopathy -Topotecan (1.5 mg/m square) for 5 days every 21 days started on 08/24/2017.  -He gets brain MRI done in [place] periodically.  Last scan was negative for metastatic disease.  -Topotecan Dose reduced to 1.2 mg/m during cycle 4.  It was also increased to every 4 weeks for better tolerability. - CT CAP on 05/16/2018 showed previously identified right-sided pulmonary nodules stable, no new or progressive findings in the chest, abdomen and pelvis.  - Last cycle of chemotherapy was on 09/20/2018.  He tolerated it pretty well. -He had mild fatigue and constipation issues.  He is taking stool softener and MiraLAX. - I reviewed his blood work.  They are adequate to proceed with his next cycle of chemotherapy. -I will see him back in 4 weeks for follow-up.  I plan to repeat a CT CAP prior to next visit.  2.  Normocytic anemia: -This is chemotherapy-induced.  Last Feraheme infusion was on 10/29/2017. -Hemoglobin today is 9.9.  Patient is also taking iron tablet daily.   3.  Peripheral neuropathy: -We will continue gabapentin 300 mg twice daily.  4.  Cancer/chemotherapy related pains: -He will continue Percocet 5 mg 1 and half tablets daily.

## 2018-10-26 ENCOUNTER — Encounter (HOSPITAL_COMMUNITY): Payer: Self-pay

## 2018-10-26 ENCOUNTER — Inpatient Hospital Stay (HOSPITAL_COMMUNITY): Payer: Medicare PPO

## 2018-10-26 VITALS — BP 127/52 | HR 52 | Temp 97.4°F | Resp 18

## 2018-10-26 DIAGNOSIS — C3490 Malignant neoplasm of unspecified part of unspecified bronchus or lung: Secondary | ICD-10-CM

## 2018-10-26 DIAGNOSIS — G629 Polyneuropathy, unspecified: Secondary | ICD-10-CM | POA: Diagnosis not present

## 2018-10-26 DIAGNOSIS — F17210 Nicotine dependence, cigarettes, uncomplicated: Secondary | ICD-10-CM | POA: Diagnosis not present

## 2018-10-26 DIAGNOSIS — I2510 Atherosclerotic heart disease of native coronary artery without angina pectoris: Secondary | ICD-10-CM | POA: Diagnosis not present

## 2018-10-26 DIAGNOSIS — Z5111 Encounter for antineoplastic chemotherapy: Secondary | ICD-10-CM | POA: Diagnosis not present

## 2018-10-26 DIAGNOSIS — D649 Anemia, unspecified: Secondary | ICD-10-CM | POA: Diagnosis not present

## 2018-10-26 DIAGNOSIS — G893 Neoplasm related pain (acute) (chronic): Secondary | ICD-10-CM | POA: Diagnosis not present

## 2018-10-26 DIAGNOSIS — F419 Anxiety disorder, unspecified: Secondary | ICD-10-CM | POA: Diagnosis not present

## 2018-10-26 DIAGNOSIS — C787 Secondary malignant neoplasm of liver and intrahepatic bile duct: Secondary | ICD-10-CM | POA: Diagnosis not present

## 2018-10-26 MED ORDER — SODIUM CHLORIDE 0.9% FLUSH
10.0000 mL | INTRAVENOUS | Status: DC | PRN
  Administered 2018-10-26 (×2): 10 mL
  Filled 2018-10-26 (×2): qty 10

## 2018-10-26 MED ORDER — TOPOTECAN HCL CHEMO INJECTION 4 MG
1.2000 mg/m2 | Freq: Once | INTRAVENOUS | Status: AC
  Administered 2018-10-26: 2.2 mg via INTRAVENOUS
  Filled 2018-10-26: qty 2.2

## 2018-10-26 MED ORDER — HEPARIN SOD (PORK) LOCK FLUSH 100 UNIT/ML IV SOLN
500.0000 [IU] | Freq: Once | INTRAVENOUS | Status: AC | PRN
  Administered 2018-10-26: 500 [IU]

## 2018-10-26 MED ORDER — SODIUM CHLORIDE 0.9 % IV SOLN
Freq: Once | INTRAVENOUS | Status: AC
  Administered 2018-10-26: 11:00:00 via INTRAVENOUS

## 2018-10-26 MED ORDER — SODIUM CHLORIDE 0.9 % IV SOLN
Freq: Once | INTRAVENOUS | Status: AC
  Administered 2018-10-26: 11:00:00 via INTRAVENOUS
  Filled 2018-10-26: qty 2

## 2018-10-26 NOTE — Progress Notes (Signed)
Pt presents today for treatment. Vital signs within parameters for treatment. Day 2. MAR reviewed. No changes since the last visit.   Treatment given today per MD orders. Tolerated infusion without adverse affects. Vital signs stable. No complaints at this time. Discharged from clinic ambulatory. F/U with [facility] as scheduled.

## 2018-10-26 NOTE — Patient Instructions (Signed)
[facility] Discharge Instructions for Patients Receiving Chemotherapy  Today you received the following chemotherapy agents   To help prevent nausea and vomiting after your treatment, we encourage you to take your nausea medication   If you develop nausea and vomiting that is not controlled by your nausea medication, call the clinic.   BELOW ARE SYMPTOMS THAT SHOULD BE REPORTED IMMEDIATELY:  *FEVER GREATER THAN 100.5 F  *CHILLS WITH OR WITHOUT FEVER  NAUSEA AND VOMITING THAT IS NOT CONTROLLED WITH YOUR NAUSEA MEDICATION  *UNUSUAL SHORTNESS OF BREATH  *UNUSUAL BRUISING OR BLEEDING  TENDERNESS IN MOUTH AND THROAT WITH OR WITHOUT PRESENCE OF ULCERS  *URINARY PROBLEMS  *BOWEL PROBLEMS  UNUSUAL RASH Items with * indicate a potential emergency and should be followed up as soon as possible.  Feel free to call the clinic should you have any questions or concerns. The clinic phone number is [phone].  Please show the CHEMO ALERT CARD at check-in to the Emergency Department and triage nurse.   

## 2018-10-27 ENCOUNTER — Inpatient Hospital Stay (HOSPITAL_COMMUNITY): Payer: Medicare PPO

## 2018-10-27 ENCOUNTER — Other Ambulatory Visit: Payer: Self-pay

## 2018-10-27 VITALS — BP 136/65 | HR 54 | Temp 97.5°F | Resp 18 | Wt 149.2 lb

## 2018-10-27 DIAGNOSIS — D649 Anemia, unspecified: Secondary | ICD-10-CM | POA: Diagnosis not present

## 2018-10-27 DIAGNOSIS — C3490 Malignant neoplasm of unspecified part of unspecified bronchus or lung: Secondary | ICD-10-CM

## 2018-10-27 DIAGNOSIS — I2510 Atherosclerotic heart disease of native coronary artery without angina pectoris: Secondary | ICD-10-CM | POA: Diagnosis not present

## 2018-10-27 DIAGNOSIS — Z5111 Encounter for antineoplastic chemotherapy: Secondary | ICD-10-CM | POA: Diagnosis not present

## 2018-10-27 DIAGNOSIS — F419 Anxiety disorder, unspecified: Secondary | ICD-10-CM | POA: Diagnosis not present

## 2018-10-27 DIAGNOSIS — G629 Polyneuropathy, unspecified: Secondary | ICD-10-CM | POA: Diagnosis not present

## 2018-10-27 DIAGNOSIS — C787 Secondary malignant neoplasm of liver and intrahepatic bile duct: Secondary | ICD-10-CM | POA: Diagnosis not present

## 2018-10-27 DIAGNOSIS — G893 Neoplasm related pain (acute) (chronic): Secondary | ICD-10-CM | POA: Diagnosis not present

## 2018-10-27 DIAGNOSIS — F17210 Nicotine dependence, cigarettes, uncomplicated: Secondary | ICD-10-CM | POA: Diagnosis not present

## 2018-10-27 MED ORDER — HEPARIN SOD (PORK) LOCK FLUSH 100 UNIT/ML IV SOLN
500.0000 [IU] | Freq: Once | INTRAVENOUS | Status: AC | PRN
  Administered 2018-10-27: 13:00:00 500 [IU]

## 2018-10-27 MED ORDER — SODIUM CHLORIDE 0.9 % IV SOLN
Freq: Once | INTRAVENOUS | Status: AC
  Administered 2018-10-27: 11:00:00 via INTRAVENOUS
  Filled 2018-10-27: qty 2

## 2018-10-27 MED ORDER — TOPOTECAN HCL CHEMO INJECTION 4 MG
1.2000 mg/m2 | Freq: Once | INTRAVENOUS | Status: AC
  Administered 2018-10-27: 2.2 mg via INTRAVENOUS
  Filled 2018-10-27: qty 2.2

## 2018-10-27 MED ORDER — SODIUM CHLORIDE 0.9% FLUSH
10.0000 mL | INTRAVENOUS | Status: DC | PRN
  Administered 2018-10-27: 10 mL
  Filled 2018-10-27: qty 10

## 2018-10-27 MED ORDER — SODIUM CHLORIDE 0.9 % IV SOLN
Freq: Once | INTRAVENOUS | Status: AC
  Administered 2018-10-27: 11:00:00 via INTRAVENOUS

## 2018-10-27 NOTE — Progress Notes (Signed)
Treatment given today per MD orders. Tolerated infusion without adverse affects. Vital signs stable. No complaints at this time. Discharged from clinic ambulatory. F/U with [facility] as scheduled.   

## 2018-10-27 NOTE — Patient Instructions (Signed)
[facility] Discharge Instructions for Patients Receiving Chemotherapy  Today you received the following chemotherapy agents   To help prevent nausea and vomiting after your treatment, we encourage you to take your nausea medication   If you develop nausea and vomiting that is not controlled by your nausea medication, call the clinic.   BELOW ARE SYMPTOMS THAT SHOULD BE REPORTED IMMEDIATELY:  *FEVER GREATER THAN 100.5 F  *CHILLS WITH OR WITHOUT FEVER  NAUSEA AND VOMITING THAT IS NOT CONTROLLED WITH YOUR NAUSEA MEDICATION  *UNUSUAL SHORTNESS OF BREATH  *UNUSUAL BRUISING OR BLEEDING  TENDERNESS IN MOUTH AND THROAT WITH OR WITHOUT PRESENCE OF ULCERS  *URINARY PROBLEMS  *BOWEL PROBLEMS  UNUSUAL RASH Items with * indicate a potential emergency and should be followed up as soon as possible.  Feel free to call the clinic should you have any questions or concerns. The clinic phone number is [phone].  Please show the CHEMO ALERT CARD at check-in to the Emergency Department and triage nurse.   

## 2018-10-28 ENCOUNTER — Inpatient Hospital Stay (HOSPITAL_COMMUNITY): Payer: Medicare PPO

## 2018-10-28 VITALS — BP 125/41 | HR 50 | Temp 97.7°F | Resp 16 | Wt 153.0 lb

## 2018-10-28 DIAGNOSIS — G893 Neoplasm related pain (acute) (chronic): Secondary | ICD-10-CM | POA: Diagnosis not present

## 2018-10-28 DIAGNOSIS — D649 Anemia, unspecified: Secondary | ICD-10-CM | POA: Diagnosis not present

## 2018-10-28 DIAGNOSIS — C787 Secondary malignant neoplasm of liver and intrahepatic bile duct: Secondary | ICD-10-CM | POA: Diagnosis not present

## 2018-10-28 DIAGNOSIS — F419 Anxiety disorder, unspecified: Secondary | ICD-10-CM | POA: Diagnosis not present

## 2018-10-28 DIAGNOSIS — C3490 Malignant neoplasm of unspecified part of unspecified bronchus or lung: Secondary | ICD-10-CM | POA: Diagnosis not present

## 2018-10-28 DIAGNOSIS — I2510 Atherosclerotic heart disease of native coronary artery without angina pectoris: Secondary | ICD-10-CM | POA: Diagnosis not present

## 2018-10-28 DIAGNOSIS — G629 Polyneuropathy, unspecified: Secondary | ICD-10-CM | POA: Diagnosis not present

## 2018-10-28 DIAGNOSIS — Z5111 Encounter for antineoplastic chemotherapy: Secondary | ICD-10-CM | POA: Diagnosis not present

## 2018-10-28 DIAGNOSIS — F17210 Nicotine dependence, cigarettes, uncomplicated: Secondary | ICD-10-CM | POA: Diagnosis not present

## 2018-10-28 MED ORDER — HEPARIN SOD (PORK) LOCK FLUSH 100 UNIT/ML IV SOLN
500.0000 [IU] | Freq: Once | INTRAVENOUS | Status: AC | PRN
  Administered 2018-10-28: 14:00:00 500 [IU]

## 2018-10-28 MED ORDER — SODIUM CHLORIDE 0.9% FLUSH
10.0000 mL | INTRAVENOUS | Status: DC | PRN
  Administered 2018-10-28: 10 mL
  Filled 2018-10-28: qty 10

## 2018-10-28 MED ORDER — TOPOTECAN HCL CHEMO INJECTION 4 MG
1.2000 mg/m2 | Freq: Once | INTRAVENOUS | Status: AC
  Administered 2018-10-28: 2.2 mg via INTRAVENOUS
  Filled 2018-10-28: qty 2.2

## 2018-10-28 MED ORDER — SODIUM CHLORIDE 0.9 % IV SOLN
Freq: Once | INTRAVENOUS | Status: AC
  Administered 2018-10-28: 12:00:00 via INTRAVENOUS
  Filled 2018-10-28: qty 2

## 2018-10-28 MED ORDER — SODIUM CHLORIDE 0.9 % IV SOLN
Freq: Once | INTRAVENOUS | Status: AC
  Administered 2018-10-28: 11:00:00 via INTRAVENOUS

## 2018-10-28 NOTE — Progress Notes (Signed)
Pt presents today for Topotecan day 4. VSS. No changes since the last visit.   Treatment given today per MD orders. Tolerated infusion without adverse affects. Vital signs stable. No complaints at this time. Discharged from clinic ambulatory. F/U with [facility] as scheduled.

## 2018-10-28 NOTE — Patient Instructions (Signed)
[facility] Discharge Instructions for Patients Receiving Chemotherapy  Today you received the following chemotherapy agents   To help prevent nausea and vomiting after your treatment, we encourage you to take your nausea medication   If you develop nausea and vomiting that is not controlled by your nausea medication, call the clinic.   BELOW ARE SYMPTOMS THAT SHOULD BE REPORTED IMMEDIATELY:  *FEVER GREATER THAN 100.5 F  *CHILLS WITH OR WITHOUT FEVER  NAUSEA AND VOMITING THAT IS NOT CONTROLLED WITH YOUR NAUSEA MEDICATION  *UNUSUAL SHORTNESS OF BREATH  *UNUSUAL BRUISING OR BLEEDING  TENDERNESS IN MOUTH AND THROAT WITH OR WITHOUT PRESENCE OF ULCERS  *URINARY PROBLEMS  *BOWEL PROBLEMS  UNUSUAL RASH Items with * indicate a potential emergency and should be followed up as soon as possible.  Feel free to call the clinic should you have any questions or concerns. The clinic phone number is [phone].  Please show the CHEMO ALERT CARD at check-in to the Emergency Department and triage nurse.   

## 2018-10-29 ENCOUNTER — Inpatient Hospital Stay (HOSPITAL_COMMUNITY): Payer: Medicare PPO

## 2018-10-29 ENCOUNTER — Other Ambulatory Visit: Payer: Self-pay

## 2018-10-29 ENCOUNTER — Encounter (HOSPITAL_COMMUNITY): Payer: Self-pay

## 2018-10-29 VITALS — BP 143/59 | HR 57 | Temp 97.9°F | Resp 18

## 2018-10-29 DIAGNOSIS — I2510 Atherosclerotic heart disease of native coronary artery without angina pectoris: Secondary | ICD-10-CM | POA: Diagnosis not present

## 2018-10-29 DIAGNOSIS — C787 Secondary malignant neoplasm of liver and intrahepatic bile duct: Secondary | ICD-10-CM | POA: Diagnosis not present

## 2018-10-29 DIAGNOSIS — G893 Neoplasm related pain (acute) (chronic): Secondary | ICD-10-CM | POA: Diagnosis not present

## 2018-10-29 DIAGNOSIS — F17210 Nicotine dependence, cigarettes, uncomplicated: Secondary | ICD-10-CM | POA: Diagnosis not present

## 2018-10-29 DIAGNOSIS — C3490 Malignant neoplasm of unspecified part of unspecified bronchus or lung: Secondary | ICD-10-CM

## 2018-10-29 DIAGNOSIS — Z5111 Encounter for antineoplastic chemotherapy: Secondary | ICD-10-CM | POA: Diagnosis not present

## 2018-10-29 DIAGNOSIS — G629 Polyneuropathy, unspecified: Secondary | ICD-10-CM | POA: Diagnosis not present

## 2018-10-29 DIAGNOSIS — F419 Anxiety disorder, unspecified: Secondary | ICD-10-CM | POA: Diagnosis not present

## 2018-10-29 DIAGNOSIS — D649 Anemia, unspecified: Secondary | ICD-10-CM | POA: Diagnosis not present

## 2018-10-29 MED ORDER — SODIUM CHLORIDE 0.9 % IV SOLN
Freq: Once | INTRAVENOUS | Status: AC
  Administered 2018-10-29: 11:00:00 via INTRAVENOUS
  Filled 2018-10-29: qty 2

## 2018-10-29 MED ORDER — SODIUM CHLORIDE 0.9% FLUSH
10.0000 mL | INTRAVENOUS | Status: DC | PRN
  Administered 2018-10-29: 10 mL
  Filled 2018-10-29: qty 10

## 2018-10-29 MED ORDER — HEPARIN SOD (PORK) LOCK FLUSH 100 UNIT/ML IV SOLN
500.0000 [IU] | Freq: Once | INTRAVENOUS | Status: AC | PRN
  Administered 2018-10-29: 13:00:00 500 [IU]

## 2018-10-29 MED ORDER — SODIUM CHLORIDE 0.9 % IV SOLN
Freq: Once | INTRAVENOUS | Status: AC
  Administered 2018-10-29: 11:00:00 via INTRAVENOUS

## 2018-10-29 MED ORDER — TOPOTECAN HCL CHEMO INJECTION 4 MG
1.2000 mg/m2 | Freq: Once | INTRAVENOUS | Status: AC
  Administered 2018-10-29: 2.2 mg via INTRAVENOUS
  Filled 2018-10-29: qty 2.2

## 2018-10-29 MED ORDER — PEGFILGRASTIM 6 MG/0.6ML ~~LOC~~ PSKT
6.0000 mg | PREFILLED_SYRINGE | Freq: Once | SUBCUTANEOUS | Status: AC
  Administered 2018-10-29: 6 mg via SUBCUTANEOUS
  Filled 2018-10-29: qty 0.6

## 2018-10-29 NOTE — Progress Notes (Signed)
Nutrition Follow-up:  RD working remotely.  Patient with lung cancer with liver mets.  Patient receiving chemotherapy.   Called patient earlier this am (11:13) and left message.    Called patient again this pm and patient reports that he is doing about the same.  Reports weight is stable and continues to eat foods high in protein and calories to maintain weight.  Reports that he has always been 145-155 lb since he was 68 year old until he took chemo with steroids being added and gained weight.   Denies nutrition impact symptoms at this time    Medications: reviewed  Labs: reviewed  Anthropometrics:   Weight 153 lb noted on 4/16 slight decrease from 156 lb on 3/13 (?fluid weight gain).     NUTRITION DIAGNOSIS: Malnutrition improved   MALNUTRITION DIAGNOSIS: severe malnutrition improved   INTERVENTION:  Encouraged patient to continue eating high calorie, high protein foods     MONITORING, EVALUATION, GOAL: weight trends, intake   NEXT VISIT: as needed  Joli B. Zenia Resides, [place], [place] Registered Dietitian [phone] (pager)

## 2018-10-29 NOTE — Patient Instructions (Signed)
[facility] Discharge Instructions for Patients Receiving Chemotherapy   Beginning January 23rd 2017 lab work for the [facility] will be done in the  Main lab at [facility] on 1st floor. If you have a lab appointment with the [facility] please come in thru the  Main Entrance and check in at the main information desk   Today you received the following chemotherapy agents Topotecan as well as Neulasta on-pro. Follow-up as scheduled. Call clinic for any questions or concerns  To help prevent nausea and vomiting after your treatment, we encourage you to take your nausea medication    If you develop nausea and vomiting, or diarrhea that is not controlled by your medication, call the clinic.  The clinic phone number is [phone]. Office hours are Monday-Friday 8:30am-5:00pm.  BELOW ARE SYMPTOMS THAT SHOULD BE REPORTED IMMEDIATELY:  *FEVER GREATER THAN 101.0 F  *CHILLS WITH OR WITHOUT FEVER  NAUSEA AND VOMITING THAT IS NOT CONTROLLED WITH YOUR NAUSEA MEDICATION  *UNUSUAL SHORTNESS OF BREATH  *UNUSUAL BRUISING OR BLEEDING  TENDERNESS IN MOUTH AND THROAT WITH OR WITHOUT PRESENCE OF ULCERS  *URINARY PROBLEMS  *BOWEL PROBLEMS  UNUSUAL RASH Items with * indicate a potential emergency and should be followed up as soon as possible. If you have an emergency after office hours please contact your primary care physician or go to the nearest emergency department.  Please call the clinic during office hours if you have any questions or concerns.   You may also contact the Patient Navigator at [phone] should you have any questions or need assistance in obtaining follow up care.      Resources For Cancer Patients and their Caregivers ? American Cancer Society: Can assist with transportation, wigs, general needs, runs Look Good Feel Better.        678-815-7544 ? Cancer Care: Provides financial assistance, online support groups, medication/co-pay  assistance.  1-800-813-HOPE 519-861-9409) ? [place] Assists [place] cancer patients and their families through emotional , educational and financial support.  804-389-5868 ? [place] DSS Where to apply for food stamps, Medicaid and utility assistance. 346-684-8583 ? [facility]: Transportation to medical appointments. 8058834418 ? Social Security Administration: May apply for disability if have a Stage IV cancer. 717-838-3409 701-006-5592 ? [employer], Disability and Transit Services: Assists with nutrition, care and transit needs. 854-212-7476

## 2018-10-29 NOTE — Progress Notes (Signed)
[name] tolerated Topotecan infusion and Neulasta on-pro well without complaints or incident. VSS upon discharge. Neulasta on-pro applied to pt's right arm with green indicator light flashing upon discharge. Pt discharged self ambulatory in satisfactory condition

## 2018-11-04 ENCOUNTER — Other Ambulatory Visit (HOSPITAL_COMMUNITY): Payer: Self-pay | Admitting: Nurse Practitioner

## 2018-11-04 DIAGNOSIS — C3490 Malignant neoplasm of unspecified part of unspecified bronchus or lung: Secondary | ICD-10-CM

## 2018-11-09 ENCOUNTER — Ambulatory Visit (INDEPENDENT_AMBULATORY_CARE_PROVIDER_SITE_OTHER): Payer: Medicare PPO | Admitting: Family Medicine

## 2018-11-09 ENCOUNTER — Other Ambulatory Visit: Payer: Self-pay

## 2018-11-09 DIAGNOSIS — F411 Generalized anxiety disorder: Secondary | ICD-10-CM | POA: Diagnosis not present

## 2018-11-09 DIAGNOSIS — F5101 Primary insomnia: Secondary | ICD-10-CM

## 2018-11-09 MED ORDER — TEMAZEPAM 15 MG PO CAPS: 15.0000 mg | ORAL_CAPSULE | Freq: Every evening | ORAL | 0 refills | Status: DC | PRN

## 2018-11-09 MED ORDER — ALPRAZOLAM 1 MG PO TABS: ORAL_TABLET | ORAL | 0 refills | Status: DC

## 2018-11-09 NOTE — Progress Notes (Signed)
Subjective:    Patient ID: [name], male    DOB: March 30, 1951, 68 y.o.   MRN: [id number]  HPI Patient is being seen today as a phone visit.  He consents to be seen over the telephone.  Phone call began at 859.  Phone call ended at 910.  Patient has a longstanding history of anxiety disorder.  Prior to becoming my patient, the patient was on Xanax 1 mg 4 times a day.  There is been no evidence of abuse or diversion.  He takes the medication at 6 AM, lunchtime, 6 PM, and then at bedtime which is 12 AM for him.  He has been on this medication for 15 years and up until recently it has worked well for him.  Unfortunately he has been diagnosed with stage IV metastatic lung cancer.  Patient states that at night he is no longer able to sleep.  He had tried trazodone and increase the medication up to 400 mg in the past without any benefit so he discontinued the medication.  At night he will typically go to bed at 12:00.  It usually takes him 30 minutes to fall asleep.  After he falls asleep he will quickly wake up approximately 1 hour later.  Often he wakes up at 1:30 in the morning or 2:00 in the morning.  It will then take him another hour or so to fall back asleep.  Often he has to get out of bed and watch TV or read a book.  He is getting less than 2 hours of sleep per evening.  This leaves him feeling extremely tired and drained throughout the day.  Usually when he wakes up, his mind will start to perseverate on his lung cancer and anxiety.  His thoughts will race and he will feel very isolated and alone and scared at night.  Tramadol is on his medication list however he denies taking any tramadol.  He does take Percocet however he only takes 1 tablet of Percocet per day.  He takes this in the morning when he gets out of bed.  He is not drinking any caffeine after 4:00 in the afternoon.  He denies taking any other stimulants.  He denies electronics in the bedroom.  Due to pain he is not able to exercise.   Pain is not keeping him awake at night.  He denies any depression or racing thoughts or tangential speech or delusions or hallucinations or evidence of mania.  He denies any suicidal thoughts. Past Medical History:  Diagnosis Date   Anxiety    CAD (coronary artery disease)    Cancer ([place])    stage 4 small cell lung cancer    COPD (chronic obstructive pulmonary disease) (HCC)    Depression    Dyspnea    increased exertion   Feeling of chest tightness    Heart palpitations    History of chemotherapy    Myocardial infarction ([place])    Osteopenia    Panic attacks    Smoker    Past Surgical History:  Procedure Laterality Date   BACK SURGERY  12/24/2000   L5,S1   CORONARY STENT PLACEMENT  2005   RCA & CX   HERNIA REPAIR Right 1980's   INGUINAL HERNIA REPAIR  12/1978   right side   IR FLUORO GUIDE PORT INSERTION RIGHT  04/02/2017   IR US GUIDE BX ASP/DRAIN  02/03/2017   IR US GUIDE VASC ACCESS RIGHT  04/02/2017   NM [employer]  PERF WALL MOTION  09/07/2009   No ischemia; EF 51%   SHOULDER SURGERY Left 08/2010   SPINE SURGERY  2002   L5-S1   Current Outpatient Medications on File Prior to Visit  Medication Sig Dispense Refill   aspirin EC 81 MG tablet Take 81 mg by mouth daily.     atorvastatin (LIPITOR) 40 MG tablet TAKE (1) TABLET BY MOUTH ONCE DAILY. 90 tablet 0   clotrimazole-betamethasone (LOTRISONE) cream Apply 1 application topically 2 (two) times daily. 30 g 0   cyclobenzaprine (FLEXERIL) 5 MG tablet TAKE 1 TABLET THREE TIMES DAILY AS NEEDED FOR MUSCLE SPASMS. 30 tablet 0   gabapentin (NEURONTIN) 300 MG capsule TAKE (1) CAPSULE BY MOUTH TWICE DAILY. 60 capsule 0   lidocaine-prilocaine (EMLA) cream Apply to affected area once 30 g 3   ondansetron (ZOFRAN) 8 MG tablet Take 1 tablet (8 mg total) by mouth 2 (two) times daily as needed. Start on the third day after cisplatin chemotherapy. 30 tablet 1   oxyCODONE-acetaminophen (PERCOCET/ROXICET) 5-325 MG  tablet TAKE 1 TABLET TWICE DAILY AS NEEDED FOR SEVERE PAIN. 60 tablet 0   Pegfilgrastim (NEULASTA ONPRO [place]) Inject into the skin. Every 21 days     polyethylene glycol powder (GLYCOLAX/MIRALAX) powder Take 17 g by mouth daily. 3350 g 2   prochlorperazine (COMPAZINE) 10 MG tablet Take 1 tablet (10 mg total) by mouth every 6 (six) hours as needed (Nausea or vomiting). 60 tablet 1   TOPOTECAN HCL IV Inject into the vein.     topotecan in sodium chloride 0.9 % 100 mL Inject into the vein once. Days 1-5 every 21 days     traMADol (ULTRAM) 50 MG tablet TAKE (1) TABLET BY MOUTH (3) TIMES DAILY. (Patient not taking: Reported on 11/09/2018) 30 tablet 0   No current facility-administered medications on file prior to visit.    Allergies  Allergen Reactions   Codeine Nausea Only   Niaspan [Niacin Er]    Social History   Socioeconomic History   Marital status: Single    Spouse name: Not on file   Number of children: Not on file   Years of education: Not on file   Highest education level: Not on file  Occupational History   Not on file  Social Needs   Financial resource strain: Not on file   Food insecurity:    Worry: Not on file    Inability: Not on file   Transportation needs:    Medical: Not on file    Non-medical: Not on file  Tobacco Use   Smoking status: Current Every Day Smoker    Packs/day: 1.00    Types: Cigarettes   Smokeless tobacco: Never Used  Substance and Sexual Activity   Alcohol use: Yes    Comment: occas   Drug use: No   Sexual activity: Not on file  Lifestyle   Physical activity:    Days per week: Not on file    Minutes per session: Not on file   Stress: Not on file  Relationships   Social connections:    Talks on phone: Not on file    Gets together: Not on file    Attends religious service: Not on file    Active member of club or organization: Not on file    Attends meetings of clubs or organizations: Not on file    Relationship  status: Not on file   Intimate partner violence:    Fear of current or ex partner:  Not on file    Emotionally abused: Not on file    Physically abused: Not on file    Forced sexual activity: Not on file  Other Topics Concern   Not on file  Social History Narrative   Not on file      Review of Systems  All other systems reviewed and are negative.      Objective:   Physical Exam No physical exam was performed today as the patient was seen over the telephone.       Assessment & Plan:  GAD (generalized anxiety disorder) - Plan: ALPRAZolam (XANAX) 1 MG tablet, temazepam (RESTORIL) 15 MG capsule  Primary insomnia - Plan: ALPRAZolam (XANAX) 1 MG tablet, temazepam (RESTORIL) 15 MG capsule  We had a long discussion today about medication interactions and potential for overdose.  I recommended not taking Percocet at bedtime due to its potential interaction in combination with Xanax or any other sleeping pills which can potentially cause respiratory depression.  Patient will not take Percocet other than first thing in the morning.  I recommended decreasing his Xanax from 4 times a day to 3 times a day.  He will take this at 6 in the morning, around lunchtime, and around dinnertime.  He will discontinue his bedtime dose.  [place], we will replace his nighttime dose of Xanax with temazepam 15 mg prior to bedtime and see if this will work better for him helping him sleep due to its longer half-life and the fact he is nave to the medication

## 2018-11-18 ENCOUNTER — Ambulatory Visit (HOSPITAL_COMMUNITY)
Admission: RE | Admit: 2018-11-18 | Discharge: 2018-11-18 | Disposition: A | Discharge status: Home / Self Care | Payer: Medicare PPO | Source: Ambulatory Visit | Attending: Nurse Practitioner | Admitting: Nurse Practitioner

## 2018-11-18 ENCOUNTER — Other Ambulatory Visit: Payer: Self-pay

## 2018-11-18 DIAGNOSIS — C3490 Malignant neoplasm of unspecified part of unspecified bronchus or lung: Secondary | ICD-10-CM | POA: Insufficient documentation

## 2018-11-18 DIAGNOSIS — C787 Secondary malignant neoplasm of liver and intrahepatic bile duct: Secondary | ICD-10-CM | POA: Diagnosis not present

## 2018-11-18 MED ORDER — IOHEXOL 300 MG/ML  SOLN
100.0000 mL | Freq: Once | INTRAMUSCULAR | Status: AC | PRN
  Administered 2018-11-18: 11:00:00 100 mL via INTRAVENOUS

## 2018-11-22 ENCOUNTER — Encounter (HOSPITAL_COMMUNITY): Payer: Self-pay | Admitting: Hematology

## 2018-11-22 ENCOUNTER — Inpatient Hospital Stay (HOSPITAL_COMMUNITY): Payer: Medicare PPO

## 2018-11-22 ENCOUNTER — Inpatient Hospital Stay (HOSPITAL_COMMUNITY): Payer: Medicare PPO | Attending: Hematology | Admitting: Hematology

## 2018-11-22 ENCOUNTER — Other Ambulatory Visit: Payer: Self-pay

## 2018-11-22 ENCOUNTER — Encounter (HOSPITAL_COMMUNITY): Payer: Self-pay

## 2018-11-22 VITALS — BP 129/58 | HR 51 | Temp 97.6°F | Resp 18

## 2018-11-22 DIAGNOSIS — Z5189 Encounter for other specified aftercare: Secondary | ICD-10-CM | POA: Diagnosis not present

## 2018-11-22 DIAGNOSIS — C787 Secondary malignant neoplasm of liver and intrahepatic bile duct: Secondary | ICD-10-CM | POA: Insufficient documentation

## 2018-11-22 DIAGNOSIS — Z79891 Long term (current) use of opiate analgesic: Secondary | ICD-10-CM | POA: Diagnosis not present

## 2018-11-22 DIAGNOSIS — C3490 Malignant neoplasm of unspecified part of unspecified bronchus or lung: Secondary | ICD-10-CM

## 2018-11-22 DIAGNOSIS — Z79899 Other long term (current) drug therapy: Secondary | ICD-10-CM

## 2018-11-22 DIAGNOSIS — Z9221 Personal history of antineoplastic chemotherapy: Secondary | ICD-10-CM | POA: Insufficient documentation

## 2018-11-22 DIAGNOSIS — G629 Polyneuropathy, unspecified: Secondary | ICD-10-CM | POA: Diagnosis not present

## 2018-11-22 DIAGNOSIS — G893 Neoplasm related pain (acute) (chronic): Secondary | ICD-10-CM | POA: Insufficient documentation

## 2018-11-22 DIAGNOSIS — Z5111 Encounter for antineoplastic chemotherapy: Secondary | ICD-10-CM | POA: Diagnosis not present

## 2018-11-22 DIAGNOSIS — C3432 Malignant neoplasm of lower lobe, left bronchus or lung: Secondary | ICD-10-CM | POA: Insufficient documentation

## 2018-11-22 DIAGNOSIS — D6481 Anemia due to antineoplastic chemotherapy: Secondary | ICD-10-CM

## 2018-11-22 DIAGNOSIS — C3431 Malignant neoplasm of lower lobe, right bronchus or lung: Secondary | ICD-10-CM | POA: Diagnosis not present

## 2018-11-22 LAB — COMPREHENSIVE METABOLIC PANEL
ALT: 12 U/L (ref 0–44)
AST: 13 U/L — ABNORMAL LOW (ref 15–41)
Albumin: 3.7 g/dL (ref 3.5–5.0)
Alkaline Phosphatase: 57 U/L (ref 38–126)
Anion gap: 8 (ref 5–15)
BUN: 16 mg/dL (ref 8–23)
CO2: 24 mmol/L (ref 22–32)
Calcium: 8.7 mg/dL — ABNORMAL LOW (ref 8.9–10.3)
Chloride: 107 mmol/L (ref 98–111)
Creatinine, Ser: 0.71 mg/dL (ref 0.61–1.24)
GFR calc Af Amer: >60 mL/min (ref >60)
GFR calc non Af Amer: >60 mL/min (ref >60)
Glucose, Bld: 89 mg/dL (ref 70–99)
Potassium: 3.9 mmol/L (ref 3.5–5.1)
Sodium: 139 mmol/L (ref 135–145)
Total Bilirubin: 0.5 mg/dL (ref 0.3–1.2)
Total Protein: 6.4 g/dL — ABNORMAL LOW (ref 6.5–8.1)

## 2018-11-22 LAB — CBC WITH DIFFERENTIAL/PLATELET
Abs Immature Granulocytes: 0.04 10*3/uL (ref 0.00–0.07)
Basophils Absolute: 0 10*3/uL (ref 0.0–0.1)
Basophils Relative: 0 %
Eosinophils Absolute: 0.1 10*3/uL (ref 0.0–0.5)
Eosinophils Relative: 1 %
HCT: 29.1 % — ABNORMAL LOW (ref 39.0–52.0)
Hemoglobin: 9.1 g/dL — ABNORMAL LOW (ref 13.0–17.0)
Immature Granulocytes: 1 %
Lymphocytes Relative: 14 %
Lymphs Abs: 1.2 10*3/uL (ref 0.7–4.0)
MCH: 30.6 pg (ref 26.0–34.0)
MCHC: 31.3 g/dL (ref 30.0–36.0)
MCV: 98 fL (ref 80.0–100.0)
Monocytes Absolute: 1.1 10*3/uL — ABNORMAL HIGH (ref 0.1–1.0)
Monocytes Relative: 13 %
Neutro Abs: 6.2 10*3/uL (ref 1.7–7.7)
Neutrophils Relative %: 71 %
Platelets: 395 10*3/uL (ref 150–400)
RBC: 2.97 MIL/uL — ABNORMAL LOW (ref 4.22–5.81)
RDW: 22.5 % — ABNORMAL HIGH (ref 11.5–15.5)
WBC: 8.7 10*3/uL (ref 4.0–10.5)
nRBC: 0 % (ref 0.0–0.2)

## 2018-11-22 LAB — LACTATE DEHYDROGENASE: LDH: 140 U/L (ref 98–192)

## 2018-11-22 MED ORDER — SODIUM CHLORIDE 0.9 % IV SOLN
Freq: Once | INTRAVENOUS | Status: AC
  Administered 2018-11-22: 09:00:00 via INTRAVENOUS

## 2018-11-22 MED ORDER — SODIUM CHLORIDE 0.9 % IV SOLN
Freq: Once | INTRAVENOUS | Status: AC
  Administered 2018-11-22: 09:00:00 via INTRAVENOUS
  Filled 2018-11-22: qty 2

## 2018-11-22 MED ORDER — HEPARIN SOD (PORK) LOCK FLUSH 100 UNIT/ML IV SOLN
500.0000 [IU] | Freq: Once | INTRAVENOUS | Status: AC | PRN
  Administered 2018-11-22: 500 [IU]

## 2018-11-22 MED ORDER — TOPOTECAN HCL CHEMO INJECTION 4 MG
1.2000 mg/m2 | Freq: Once | INTRAVENOUS | Status: AC
  Administered 2018-11-22: 2.2 mg via INTRAVENOUS
  Filled 2018-11-22: qty 2.2

## 2018-11-22 MED ORDER — SODIUM CHLORIDE 0.9% FLUSH
10.0000 mL | INTRAVENOUS | Status: DC | PRN
  Administered 2018-11-22: 10 mL
  Filled 2018-11-22: qty 10

## 2018-11-22 NOTE — Progress Notes (Signed)
085 Labs reviewed with and pt seen by Dr. Delton Coombes and pt approved for Topotecan infusion today per MD     [name] tolerated chemo tx well without complaints or incident.Port left accessed,saline locked and flushed for use tomorrow. VSS upon discharge. Pt discharged self ambulatory in satisfactory condition

## 2018-11-22 NOTE — Patient Instructions (Signed)
[facility] Discharge Instructions  Labs drawn from portacath today   Thank you for choosing [facility] at [facility] to provide your oncology and hematology care.  To afford each patient quality time with our provider, please arrive at least 15 minutes before your scheduled appointment time.   If you have a lab appointment with the Cancer Center please come in thru the  Main Entrance and check in at the main information desk  You need to re-schedule your appointment should you arrive 10 or more minutes late.  We strive to give you quality time with our providers, and arriving late affects you and other patients whose appointments are after yours.  Also, if you no show three or more times for appointments you may be dismissed from the clinic at the providers discretion.     Again, thank you for choosing  Cancer Center.  Our hope is that these requests will decrease the amount of time that you wait before being seen by our physicians.       _____________________________________________________________  Should you have questions after your visit to  Cancer Center, please contact our office at [phone] between the hours of 8:00 a.m. and 4:30 p.m.  Voicemails left after 4:00 p.m. will not be returned until the following business day.  For prescription refill requests, have your pharmacy contact our office and allow 72 hours.    Cancer Center Support Programs:   > Cancer Support Group  2nd Tuesday of the month 1pm-2pm, Journey Room   

## 2018-11-22 NOTE — Progress Notes (Signed)
[address] [place], [place] [address]   CLINIC:  Medical Oncology/Hematology  PCP:  [name], MD [address] [facility] [id number] [phone]   REASON FOR VISIT:  Follow-up for extensive stage small cell lung cancer.  CURRENT THERAPY:Topotecan every 4 weeks.   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 Bulky 5.4 cm right supraclavicular region malignant lymph node conglomeration with extracapsular extension. 2. Surrounding smaller abnormal right level 3 and level 5 lymph nodes, and the lymphadenopathy continues into the superior mediastinum, see Chest CT findings reported separately. 3. No other metastatic disease identified in the neck.    01/16/2017 Imaging    CT chest: IMPRESSION: 1. Extensive lymphadenopathy in the thorax and lower right cervical region, as discussed above. Primary differential considerations include lymphoma/leukemia or small cell carcinoma of the lung. Further evaluation a PET-CT could be considered to assess for additional sites of disease below the diaphragm if clinically appropriate. Additionally, ultrasound-guided biopsy of supraclavicular lymphadenopathy could be considered to establish a tissue diagnosis. 2. Indeterminate lesion in the periphery of segment 8 of the liver measuring 2.7 x 1.7 cm. Attention at time of follow-up PET-CT is recommended. 3. Aortic atherosclerosis, in addition to left main and 3 vessel coronary artery disease. Please note that although the presence of coronary artery calcium documents the presence of coronary artery disease, the severity of this disease and any potential stenosis cannot be assessed on this non-gated CT examination. Assessment for potential risk factor modification, dietary therapy or pharmacologic therapy may be warranted, if clinically indicated. 4. There  are calcifications of the aortic valve. Echocardiographic correlation for evaluation of potential valvular dysfunction may be warranted if clinically indicated. 5. Diffuse bronchial wall thickening with moderate centrilobular and paraseptal emphysema; imaging findings suggestive of underlying COPD.    02/03/2017 Initial Biopsy    (R) neck lymph node biopsy: SMALL CELL CARCINOMA (most likely lung primary).     02/03/2017 Miscellaneous    Port-a-cath attempted by IR; unable to place d/t enlarged SVC.     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 Staging for metastatic disease  EXAM: MRI HEAD WITHOUT AND WITH CONTRAST  TECHNIQUE: Multiplanar, multiecho pulse sequences of the brain and surrounding structures were obtained without and with intravenous contrast.  CONTRAST:  57m MULTIHANCE GADOBENATE DIMEGLUMINE 529 MG/ML IV SOLN  COMPARISON:  None.  FINDINGS: Brain: Negative for hydrocephalus. Cerebral volume normal for age. Small nonenhancing white matter hyperintensities consistent with mild chronic microvascular ischemia. No acute infarct. Negative for hemorrhage or mass or edema  Normal enhancement postcontrast infusion. No enhancing mass lesion. Leptomeningeal enhancement is normal.  Vascular: Normal arterial flow voids.  Normal venous enhancement  Skull and upper cervical spine: Negative  Sinuses/Orbits: Negative  Other: None  IMPRESSION: Negative for metastatic disease.  No acute abnormality.  Mild chronic white matter changes.    04/07/2017 Imaging     PET:  1. Marked reduction in size and metabolic activity of bulky RIGHT supraclavicular adenopathy mediastinal lymphadenopathy. 2. Residual moderate activity remains within small  RIGHT supraclavicular lymph node, RIGHT lower paratracheal lymph node and  RIGHT hilar lymph node. 3. Resolution of prevascular and internal mammary mediastinal metastatic hypermetabolic activity. 4. Resolution of metabolic activity associated with solitary RIGHT hepatic lobe liver metastasis. 5. No evidence of disease progression. 6. 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    05/27/2017 Imaging    MRI brain w/ and w/o contrast IMPRESSION: 1. No metastatic disease identified. 2. Increased nonspecific cerebral white matter signal changes since August. These are most commonly small vessel disease related. 3. New right maxillary sinusitis. Benign appearing retention cysts in the nasopharynx with trace mastoid effusions.    06/12/2017 Imaging    PET-CT IMPRESSION: 1. There are two new hypermetabolic nodules identified within both lower lobes measuring up to 3.1 cm. The appearance is nonspecific and may be inflammatory/infectious in etiology. Pulmonary metastatic disease cannot be excluded and short-term follow-up imaging in 3 months is advised to reassess these nodules. 2. Stable appearance of mild hypermetabolic activity associated with right paratracheal and right hilar lymph nodes. 3. Decrease in FDG uptake associated with index right supraclavicular lymph node. 4. 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 5. Aortic Atherosclerosis (ICD10-I70.0) and Emphysema (ICD10-J43.9).    08/24/2017 -  Chemotherapy    The patient had pegfilgrastim (NEULASTA ONPRO KIT) injection 6 mg, 6 mg, Subcutaneous, Once, 17 of 19 cycles Administration: 6 mg (08/28/2017), 6 mg (09/18/2017), 6 mg (10/09/2017), 6 mg (11/02/2017), 6 mg (11/20/2017), 6 mg (12/18/2017), 6 mg (01/15/2018), 6 mg (02/12/2018), 6 mg (03/12/2018), 6 mg  (04/09/2018), 6 mg (05/07/2018), 6 mg (06/04/2018), 6 mg (07/02/2018), 6 mg (07/30/2018), 6 mg (08/27/2018), 6 mg (09/24/2018), 6 mg (10/29/2018) topotecan (HYCAMTIN) 2.9 mg in sodium chloride 0.9 % 100 mL chemo infusion, 1.5 mg/m2 = 2.9 mg, Intravenous,  Once, 17 of 19 cycles Dose modification: 1.2 mg/m2 (80 % of original dose 1.5 mg/m2, Cycle 4, Reason: Dose Not Tolerated) Administration: 2.9 mg (08/24/2017), 2.9 mg (08/25/2017), 2.9 mg (08/28/2017), 2.9 mg (08/26/2017), 2.9 mg (08/27/2017), 2.9 mg (09/14/2017), 2.9 mg (09/15/2017), 2.9 mg (09/16/2017), 2.9 mg (09/17/2017), 2.9 mg (09/18/2017), 2.9 mg (10/05/2017), 2.9 mg (10/06/2017), 2.9 mg (10/07/2017), 2.9 mg (10/08/2017), 2.9 mg (10/09/2017), 2.3 mg (10/26/2017), 2.3 mg (10/27/2017), 2.3 mg (10/28/2017), 2.3 mg (10/29/2017), 2.3 mg (11/02/2017), 2.3 mg (11/16/2017), 2.3 mg (11/17/2017), 2.3 mg (11/18/2017), 2.3 mg (11/19/2017), 2.3 mg (11/20/2017), 2.3 mg (12/14/2017), 2.3 mg (12/15/2017), 2.3 mg (12/16/2017), 2.3 mg (12/17/2017), 2.3 mg (12/18/2017), 2.3 mg (01/11/2018), 2.3 mg (01/12/2018), 2.3 mg (01/13/2018), 2.3 mg (01/15/2018), 2.3 mg (02/08/2018), 2.3 mg (02/09/2018), 2.3 mg (02/10/2018), 2.3 mg (02/11/2018), 2.3 mg (02/12/2018), 2.3 mg (03/08/2018), 2.3 mg (03/09/2018), 2.3 mg (03/10/2018), 2.3 mg (03/11/2018), 2.3 mg (03/12/2018), 2.2 mg (04/05/2018), 2.2 mg (04/06/2018), 2.2 mg (04/07/2018), 2.2 mg (04/08/2018), 2.2 mg (04/09/2018), 2.2 mg (05/03/2018), 2.2 mg (05/04/2018), 2.2 mg (05/05/2018), 2.2 mg (05/06/2018), 2.2 mg (05/07/2018), 2.2 mg (05/31/2018), 2.2 mg (06/01/2018), 2.2 mg (06/02/2018), 2.2 mg (06/03/2018), 2.2 mg (06/04/2018), 2.2 mg (06/28/2018), 2.2 mg (06/29/2018), 2.2 mg (06/30/2018), 2.2 mg (07/01/2018), 2.2 mg (07/02/2018), 2.2 mg (07/26/2018), 2.2 mg (07/27/2018), 2.2 mg (07/28/2018), 2.2 mg (07/29/2018), 2.2 mg (07/30/2018), 2.2 mg (08/23/2018), 2.2 mg (08/24/2018), 2.2 mg (08/25/2018), 2.2 mg (08/26/2018), 2.2 mg (08/27/2018), 2.2 mg (09/20/2018), 2.2 mg (09/21/2018), 2.2 mg (09/22/2018), 2.2 mg (09/23/2018), 2.2  mg (09/24/2018), 2.2 mg (10/25/2018), 2.2 mg (10/26/2018), 2.2 mg (10/27/2018), 2.2 mg (10/28/2018), 2.2 mg (10/29/2018) ondansetron (ZOFRAN) 4  mg in sodium chloride 0.9 % 50 mL IVPB, , Intravenous,  Once, 12 of 14 cycles Administration:  (02/08/2018),  (02/09/2018),  (02/10/2018),  (02/11/2018),  (02/12/2018),  (03/08/2018),  (03/09/2018),  (03/10/2018),  (03/11/2018),  (03/12/2018),  (04/05/2018),  (04/06/2018),  (04/07/2018),  (04/08/2018),  (04/09/2018),  (05/03/2018),  (05/04/2018),  (05/05/2018),  (05/06/2018),  (05/07/2018),  (05/31/2018),  (06/01/2018),  (06/02/2018),  (06/03/2018),  (06/04/2018),  (06/28/2018),  (06/29/2018),  (06/30/2018),  (07/01/2018),  (07/02/2018),  (07/26/2018),  (07/27/2018),  (07/28/2018),  (07/29/2018),  (07/30/2018),  (08/23/2018),  (08/24/2018),  (08/25/2018),  (08/26/2018),  (08/27/2018),  (09/20/2018),  (09/21/2018),  (09/22/2018),  (09/23/2018),  (09/24/2018),  (10/25/2018),  (10/26/2018),  (10/27/2018),  (10/28/2018),  (10/29/2018)  for chemotherapy treatment.       CANCER STAGING: Cancer Staging No matching staging information was found for the patient.   INTERVAL HISTORY:  [name] 68 y.o. male returns for routine follow-up and consideration for next cycle of chemotherapy. He is here today alone. He states that he has done well since his last visit. He states that he has experienced constipation at times and he uses miralax daily. He continues to have numbness in his feet that has not gotten any worse. Denies any nausea, vomiting, or diarrhea. Denies any new pains. Had not noticed any recent bleeding such as epistaxis, hematuria or hematochezia. Denies recent chest pain on exertion, shortness of breath on minimal exertion, pre-syncopal episodes, or palpitations. Denies any numbness or tingling in hands. Denies any recent fevers, infections, or recent hospitalizations. Patient reports appetite at 50% and energy level at 75%.     REVIEW OF SYSTEMS:  Review of Systems  Constitutional: Positive for  fatigue.  Gastrointestinal: Positive for constipation.  Neurological: Positive for numbness.  Hematological: Bruises/bleeds easily.  Psychiatric/Behavioral: Positive for sleep disturbance.     PAST MEDICAL/SURGICAL HISTORY:  Past Medical History:  Diagnosis Date   Anxiety    CAD (coronary artery disease)    Cancer (HCC)    stage 4 small cell lung cancer    COPD (chronic obstructive pulmonary disease) (HCC)    Depression    Dyspnea    increased exertion   Feeling of chest tightness    Heart palpitations    History of chemotherapy    Myocardial infarction ([place])    Osteopenia    Panic attacks    Smoker    Past Surgical History:  Procedure Laterality Date   BACK SURGERY  12/24/2000   L5,S1   CORONARY STENT PLACEMENT  2005   RCA & CX   HERNIA REPAIR Right 1980's   INGUINAL HERNIA REPAIR  12/1978   right side   IR FLUORO GUIDE PORT INSERTION RIGHT  04/02/2017   IR US GUIDE BX ASP/DRAIN  02/03/2017   IR US GUIDE VASC ACCESS RIGHT  04/02/2017   NM MYOCAR PERF WALL MOTION  09/07/2009   No ischemia; EF 51%   SHOULDER SURGERY Left 08/2010   SPINE SURGERY  2002   L5-S1     SOCIAL HISTORY:  Social History   Socioeconomic History   Marital status: Single    Spouse name: Not on file   Number of children: Not on file   Years of education: Not on file   Highest education level: Not on file  Occupational History   Not on file  Social Needs   Financial resource strain: Not on file   Food insecurity:    Worry: Not on file    Inability: Not on file   Transportation needs:  Medical: Not on file    Non-medical: Not on file  Tobacco Use   Smoking status: Current Every Day Smoker    Packs/day: 1.00    Types: Cigarettes   Smokeless tobacco: Never Used  Substance and Sexual Activity   Alcohol use: Yes    Comment: occas   Drug use: No   Sexual activity: Not on file  Lifestyle   Physical activity:    Days per week: Not on file     Minutes per session: Not on file   Stress: Not on file  Relationships   Social connections:    Talks on phone: Not on file    Gets together: Not on file    Attends religious service: Not on file    Active member of club or organization: Not on file    Attends meetings of clubs or organizations: Not on file    Relationship status: Not on file   Intimate partner violence:    Fear of current or ex partner: Not on file    Emotionally abused: Not on file    Physically abused: Not on file    Forced sexual activity: Not on file  Other Topics Concern   Not on file  Social History Narrative   Not on file    FAMILY HISTORY:  Family History  Problem Relation Age of Onset   Heart attack Father    Kidney disease Father        renal failure   Heart failure Mother    Heart attack Mother    Cancer Brother    Diabetes Brother    Alcohol abuse Brother    Diabetes Sister     CURRENT MEDICATIONS:  Outpatient Encounter Medications as of 11/22/2018  Medication Sig   ALPRAZolam (XANAX) 1 MG tablet TAKE (1) TABLET BY MOUTH (3) TIMES DAILY. Do not take with temazepam at night   aspirin EC 81 MG tablet Take 81 mg by mouth daily.   atorvastatin (LIPITOR) 40 MG tablet TAKE (1) TABLET BY MOUTH ONCE DAILY.   clotrimazole-betamethasone (LOTRISONE) cream Apply 1 application topically 2 (two) times daily. (Patient not taking: Reported on 11/22/2018)   cyclobenzaprine (FLEXERIL) 5 MG tablet TAKE 1 TABLET THREE TIMES DAILY AS NEEDED FOR MUSCLE SPASMS. (Patient not taking: Reported on 11/22/2018)   gabapentin (NEURONTIN) 300 MG capsule TAKE (1) CAPSULE BY MOUTH TWICE DAILY.   lidocaine-prilocaine (EMLA) cream Apply to affected area once (Patient not taking: Reported on 11/22/2018)   ondansetron (ZOFRAN) 8 MG tablet Take 1 tablet (8 mg total) by mouth 2 (two) times daily as needed. Start on the third day after cisplatin chemotherapy.   oxyCODONE-acetaminophen (PERCOCET/ROXICET) 5-325 MG  tablet TAKE 1 TABLET TWICE DAILY AS NEEDED FOR SEVERE PAIN.   Pegfilgrastim (NEULASTA ONPRO [place]) Inject into the skin. Every 21 days   polyethylene glycol powder (GLYCOLAX/MIRALAX) powder Take 17 g by mouth daily.   prochlorperazine (COMPAZINE) 10 MG tablet Take 1 tablet (10 mg total) by mouth every 6 (six) hours as needed (Nausea or vomiting).   temazepam (RESTORIL) 15 MG capsule Take 1 capsule (15 mg total) by mouth at bedtime as needed for sleep.   TOPOTECAN HCL IV Inject into the vein.   topotecan in sodium chloride 0.9 % 100 mL Inject into the vein once. Days 1-5 every 21 days   traMADol (ULTRAM) 50 MG tablet TAKE (1) TABLET BY MOUTH (3) TIMES DAILY. (Patient not taking: Reported on 11/09/2018)   No facility-administered encounter medications on file  as of 11/22/2018.     ALLERGIES:  Allergies  Allergen Reactions   Codeine Nausea Only   Niaspan [Niacin Er]      PHYSICAL EXAM:  ECOG Performance status: 1  Vitals:   11/22/18 0802  BP: (!) 123/51  Pulse: 64  Resp: 18  Temp: 97.7 F (36.5 C)  SpO2: 97%   Filed Weights   11/22/18 0802  Weight: 153 lb 3.2 oz (69.5 kg)    Physical Exam Vitals signs reviewed.  Constitutional:      Appearance: Normal appearance.  Cardiovascular:     Rate and Rhythm: Normal rate and regular rhythm.     Heart sounds: Normal heart sounds.  Pulmonary:     Effort: Pulmonary effort is normal.     Breath sounds: Normal breath sounds.  Abdominal:     General: There is no distension.     Palpations: Abdomen is soft. There is no mass.  Musculoskeletal:        General: No swelling.  Skin:    General: Skin is warm.  Neurological:     General: No focal deficit present.     Mental Status: He is alert and oriented to person, place, and time.  Psychiatric:        Mood and Affect: Mood normal.        Behavior: Behavior normal.      LABORATORY DATA:  I have reviewed the labs as listed.  CBC    Component Value Date/Time   WBC 8.7  11/22/2018 0759   RBC 2.97 (L) 11/22/2018 0759   HGB 9.1 (L) 11/22/2018 0759   HCT 29.1 (L) 11/22/2018 0759   PLT 395 11/22/2018 0759   MCV 98.0 11/22/2018 0759   MCH 30.6 11/22/2018 0759   MCHC 31.3 11/22/2018 0759   RDW 22.5 (H) 11/22/2018 0759   LYMPHSABS PENDING 11/22/2018 0759   MONOABS PENDING 11/22/2018 0759   EOSABS PENDING 11/22/2018 0759   BASOSABS PENDING 11/22/2018 0759   CMP Latest Ref Rng & Units 10/25/2018 09/20/2018 09/16/2018  Glucose 70 - 99 mg/dL 98 99 95  BUN 8 - 23 mg/dL _0 Creatinine 0.61 - 1.24 mg/dL 0.68 0.81 0.89  Sodium 135 - 145 mmol/L 137 139 138  Potassium 3.5 - 5.1 mmol/L 3.8 4.2 4.8  Chloride 98 - 111 mmol/L 107 109 106  CO2 22 - 32 mmol/L _1 Calcium 8.9 - 10.3 mg/dL 8.9 8.8(L) 9.2  Total Protein 6.5 - 8.1 g/dL 6.7 6.6 6.4  Total Bilirubin 0.3 - 1.2 mg/dL 0.9 0.4 0.3  Alkaline Phos 38 - 126 U/L 58 58 -  AST 15 - 41 U/L _2 ALT 0 - 44 U/L _3 DIAGNOSTIC IMAGING:  I have independently reviewed the scans and discussed with the patient.   I have reviewed Venita Lick LPN's note and agree with the documentation.  I personally performed a face-to-face visit, made revisions and my assessment and plan is as follows.    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  - PET/CT scan on 08/13/2017 showing progression with bilateral lung nodules and mediastinal adenopathy -Topotecan (1.5 mg/m square) for 5 days every 21 days started on 08/24/2017.  -He gets brain MRI done in [place] periodically.  Last scan was negative for metastatic  disease.  -Topotecan Dose reduced to 1.2 mg/m during cycle 4.  It was also increased to every 4 weeks for better tolerability. - Last Topotecan with Neulasta was on 10/29/2018. -We reviewed the results of the CT CAP dated 11/18/2018 which showed dominant  right lower lobe pulmonary nodule is stable measuring 1.7 x 0.8 cm.  Left lower lobe pulmonary nodule measures 1.6 x 0.8 cm.  Overall stable disease with no new or progressive findings. -He is tolerating chemotherapy very well.  He has tiredness during the first week.  He is able to do household work. - I have reviewed his blood work.  He may proceed with his next cycle today. -We will see him back in 4 weeks for follow-up.  2.  Normocytic anemia: -This is chemotherapy-induced.  Last Feraheme infusion was on 10/19/2017. -Hemoglobin is ranging between 8 and 9 and stable.  3.  Peripheral neuropathy: -We will continue gabapentin 300 mg twice daily.  4.  Cancer/chemotherapy related pains: -We will continue Percocet 5 mg 1 and half tablet daily as needed.   Total time spent is 25 minutes with more than 50% of the time spent face-to-face discussing scan results, reinforcing treatment plan and coordination of care.    Orders placed this encounter:  Orders Placed This Encounter  Procedures   CBC with Differential/Platelet   Comprehensive metabolic panel      Derek Jack, MD [place] [phone]

## 2018-11-22 NOTE — Patient Instructions (Signed)
[facility] Discharge Instructions for Patients Receiving Chemotherapy   Beginning January 23rd 2017 lab work for the [facility] will be done in the  Main lab at [facility] on 1st floor. If you have a lab appointment with the [facility] please come in thru the  Main Entrance and check in at the main information desk   Today you received the following chemotherapy agents Topotecan. Follow-up as scheduled. Call clinic for any questions or concerns  To help prevent nausea and vomiting after your treatment, we encourage you to take your nausea medication   If you develop nausea and vomiting, or diarrhea that is not controlled by your medication, call the clinic.  The clinic phone number is [phone]. Office hours are Monday-Friday 8:30am-5:00pm.  BELOW ARE SYMPTOMS THAT SHOULD BE REPORTED IMMEDIATELY:  *FEVER GREATER THAN 101.0 F  *CHILLS WITH OR WITHOUT FEVER  NAUSEA AND VOMITING THAT IS NOT CONTROLLED WITH YOUR NAUSEA MEDICATION  *UNUSUAL SHORTNESS OF BREATH  *UNUSUAL BRUISING OR BLEEDING  TENDERNESS IN MOUTH AND THROAT WITH OR WITHOUT PRESENCE OF ULCERS  *URINARY PROBLEMS  *BOWEL PROBLEMS  UNUSUAL RASH Items with * indicate a potential emergency and should be followed up as soon as possible. If you have an emergency after office hours please contact your primary care physician or go to the nearest emergency department.  Please call the clinic during office hours if you have any questions or concerns.   You may also contact the Patient Navigator at [phone] should you have any questions or need assistance in obtaining follow up care.      Resources For Cancer Patients and their Caregivers ? American Cancer Society: Can assist with transportation, wigs, general needs, runs Look Good Feel Better.        317-045-9847 ? Cancer Care: Provides financial assistance, online support groups, medication/co-pay assistance.  1-800-813-HOPE  307-521-7538) ? [facility] Assists [place] cancer patients and their families through emotional , educational and financial support.  (717) 858-9281 ? [place] DSS Where to apply for food stamps, Medicaid and utility assistance. 212-527-1458 ? RCATS: Transportation to medical appointments. 310-067-2112 ? Social Security Administration: May apply for disability if have a Stage IV cancer. 906-620-5677 617-274-2047 ? [employer], Disability and Transit Services: Assists with nutrition, care and transit needs. 757-451-2395

## 2018-11-22 NOTE — Patient Instructions (Addendum)
[name] at [facility] Discharge Instructions  You were seen today by Dr. Delton Coombes. He went over your recent lab, and scan results. He will see you back in 4 weeks for labs, treatment and follow up.   Thank you for choosing [place] at [facility] to provide your oncology and hematology care.  To afford each patient quality time with our provider, please arrive at least 15 minutes before your scheduled appointment time.   If you have a lab appointment with the [facility] please come in thru the  Main Entrance and check in at the main information desk  You need to re-schedule your appointment should you arrive 10 or more minutes late.  We strive to give you quality time with our providers, and arriving late affects you and other patients whose appointments are after yours.  Also, if you no show three or more times for appointments you may be dismissed from the clinic at the providers discretion.     Again, thank you for choosing [facility].  Our hope is that these requests will decrease the amount of time that you wait before being seen by our physicians.       _____________________________________________________________  Should you have questions after your visit to [facility], please contact our office at [phone] between the hours of 8:00 a.m. and 4:30 p.m.  Voicemails left after 4:00 p.m. will not be returned until the following business day.  For prescription refill requests, have your pharmacy contact our office and allow 72 hours.    Cancer Center Support Programs:   > Cancer Support Group  2nd Tuesday of the month 1pm-2pm, Journey Room

## 2018-11-22 NOTE — Assessment & Plan Note (Addendum)
1.  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  - PET/CT scan on 08/13/2017 showing progression with bilateral lung nodules and mediastinal adenopathy -Topotecan (1.5 mg/m square) for 5 days every 21 days started on 08/24/2017.  -He gets brain MRI done in [place] periodically.  Last scan was negative for metastatic disease.  -Topotecan Dose reduced to 1.2 mg/m during cycle 4.  It was also increased to every 4 weeks for better tolerability. - Last Topotecan with Neulasta was on 10/29/2018. -We reviewed the results of the CT CAP dated 11/18/2018 which showed dominant right lower lobe pulmonary nodule is stable measuring 1.7 x 0.8 cm.  Left lower lobe pulmonary nodule measures 1.6 x 0.8 cm.  Overall stable disease with no new or progressive findings. -He is tolerating chemotherapy very well.  He has tiredness during the first week.  He is able to do household work. - I have reviewed his blood work.  He may proceed with his next cycle today. -We will see him back in 4 weeks for follow-up.  2.  Normocytic anemia: -This is chemotherapy-induced.  Last Feraheme infusion was on 10/19/2017. -Hemoglobin is ranging between 8 and 9 and stable.  3.  Peripheral neuropathy: -We will continue gabapentin 300 mg twice daily.  4.  Cancer/chemotherapy related pains: -We will continue Percocet 5 mg 1 and half tablet daily as needed.

## 2018-11-23 ENCOUNTER — Encounter (HOSPITAL_COMMUNITY): Payer: Self-pay

## 2018-11-23 ENCOUNTER — Inpatient Hospital Stay (HOSPITAL_COMMUNITY): Payer: Medicare PPO

## 2018-11-23 VITALS — BP 123/61 | HR 58 | Temp 97.7°F | Resp 18

## 2018-11-23 DIAGNOSIS — Z5189 Encounter for other specified aftercare: Secondary | ICD-10-CM | POA: Diagnosis not present

## 2018-11-23 DIAGNOSIS — D6481 Anemia due to antineoplastic chemotherapy: Secondary | ICD-10-CM | POA: Diagnosis not present

## 2018-11-23 DIAGNOSIS — C3431 Malignant neoplasm of lower lobe, right bronchus or lung: Secondary | ICD-10-CM | POA: Diagnosis not present

## 2018-11-23 DIAGNOSIS — Z5111 Encounter for antineoplastic chemotherapy: Secondary | ICD-10-CM | POA: Diagnosis not present

## 2018-11-23 DIAGNOSIS — C3432 Malignant neoplasm of lower lobe, left bronchus or lung: Secondary | ICD-10-CM | POA: Diagnosis not present

## 2018-11-23 DIAGNOSIS — C787 Secondary malignant neoplasm of liver and intrahepatic bile duct: Secondary | ICD-10-CM | POA: Diagnosis not present

## 2018-11-23 DIAGNOSIS — G893 Neoplasm related pain (acute) (chronic): Secondary | ICD-10-CM | POA: Diagnosis not present

## 2018-11-23 DIAGNOSIS — G629 Polyneuropathy, unspecified: Secondary | ICD-10-CM | POA: Diagnosis not present

## 2018-11-23 DIAGNOSIS — Z79891 Long term (current) use of opiate analgesic: Secondary | ICD-10-CM | POA: Diagnosis not present

## 2018-11-23 DIAGNOSIS — C3490 Malignant neoplasm of unspecified part of unspecified bronchus or lung: Secondary | ICD-10-CM

## 2018-11-23 MED ORDER — HEPARIN SOD (PORK) LOCK FLUSH 100 UNIT/ML IV SOLN
500.0000 [IU] | Freq: Once | INTRAVENOUS | Status: AC | PRN
  Administered 2018-11-23: 500 [IU]

## 2018-11-23 MED ORDER — SODIUM CHLORIDE 0.9 % IV SOLN
Freq: Once | INTRAVENOUS | Status: AC
  Administered 2018-11-23: 09:00:00 via INTRAVENOUS

## 2018-11-23 MED ORDER — SODIUM CHLORIDE 0.9% FLUSH
10.0000 mL | INTRAVENOUS | Status: DC | PRN
  Administered 2018-11-23: 10 mL
  Filled 2018-11-23: qty 10

## 2018-11-23 MED ORDER — SODIUM CHLORIDE 0.9 % IV SOLN
Freq: Once | INTRAVENOUS | Status: AC
  Administered 2018-11-23: 10:00:00 via INTRAVENOUS
  Filled 2018-11-23: qty 2

## 2018-11-23 MED ORDER — TOPOTECAN HCL CHEMO INJECTION 4 MG
1.2000 mg/m2 | Freq: Once | INTRAVENOUS | Status: AC
  Administered 2018-11-23: 2.2 mg via INTRAVENOUS
  Filled 2018-11-23: qty 2.2

## 2018-11-23 NOTE — Patient Instructions (Signed)
[facility] Discharge Instructions for Patients Receiving Chemotherapy   Beginning January 23rd 2017 lab work for the [facility] will be done in the  Main lab at [facility] on 1st floor. If you have a lab appointment with the [facility] please come in thru the  Main Entrance and check in at the main information desk   Today you received the following chemotherapy agents Topotecan. Follow-up as scheduled. Call clinic for any questions or concerns  To help prevent nausea and vomiting after your treatment, we encourage you to take your nausea medication {C   If you develop nausea and vomiting, or diarrhea that is not controlled by your medication, call the clinic.  The clinic phone number is [phone]. Office hours are Monday-Friday 8:30am-5:00pm.  BELOW ARE SYMPTOMS THAT SHOULD BE REPORTED IMMEDIATELY:  *FEVER GREATER THAN 101.0 F  *CHILLS WITH OR WITHOUT FEVER  NAUSEA AND VOMITING THAT IS NOT CONTROLLED WITH YOUR NAUSEA MEDICATION  *UNUSUAL SHORTNESS OF BREATH  *UNUSUAL BRUISING OR BLEEDING  TENDERNESS IN MOUTH AND THROAT WITH OR WITHOUT PRESENCE OF ULCERS  *URINARY PROBLEMS  *BOWEL PROBLEMS  UNUSUAL RASH Items with * indicate a potential emergency and should be followed up as soon as possible. If you have an emergency after office hours please contact your primary care physician or go to the nearest emergency department.  Please call the clinic during office hours if you have any questions or concerns.   You may also contact the Patient Navigator at [phone] should you have any questions or need assistance in obtaining follow up care.      Resources For Cancer Patients and their Caregivers ? American Cancer Society: Can assist with transportation, wigs, general needs, runs Look Good Feel Better.        778 371 6791 ? Cancer Care: Provides financial assistance, online support groups, medication/co-pay assistance.  1-800-813-HOPE  979-052-6832) ? [facility] Assists [place] cancer patients and their families through emotional , educational and financial support.  (267)302-3255 ? [place] DSS Where to apply for food stamps, Medicaid and utility assistance. 817-247-5274 ? [employer]: Transportation to medical appointments. (229)055-5946 ? Social Security Administration: May apply for disability if have a Stage IV cancer. 212-212-5069 252-861-5114 ? [employer], Disability and Transit Services: Assists with nutrition, care and transit needs. [phone]

## 2018-11-23 NOTE — Progress Notes (Signed)
[name] tolerated Topotecan infusion well without complaints or incident. VSS upon discharge. Port left accessed and flushed for use tomorrow. Pt discharged self ambulatory in satisfactory condition

## 2018-11-24 ENCOUNTER — Other Ambulatory Visit: Payer: Self-pay

## 2018-11-24 ENCOUNTER — Inpatient Hospital Stay (HOSPITAL_COMMUNITY): Payer: Medicare PPO

## 2018-11-24 VITALS — BP 119/53 | HR 62 | Temp 97.5°F | Resp 18

## 2018-11-24 DIAGNOSIS — C3432 Malignant neoplasm of lower lobe, left bronchus or lung: Secondary | ICD-10-CM | POA: Diagnosis not present

## 2018-11-24 DIAGNOSIS — Z5189 Encounter for other specified aftercare: Secondary | ICD-10-CM | POA: Diagnosis not present

## 2018-11-24 DIAGNOSIS — C3431 Malignant neoplasm of lower lobe, right bronchus or lung: Secondary | ICD-10-CM | POA: Diagnosis not present

## 2018-11-24 DIAGNOSIS — G629 Polyneuropathy, unspecified: Secondary | ICD-10-CM | POA: Diagnosis not present

## 2018-11-24 DIAGNOSIS — Z79891 Long term (current) use of opiate analgesic: Secondary | ICD-10-CM | POA: Diagnosis not present

## 2018-11-24 DIAGNOSIS — G893 Neoplasm related pain (acute) (chronic): Secondary | ICD-10-CM | POA: Diagnosis not present

## 2018-11-24 DIAGNOSIS — C787 Secondary malignant neoplasm of liver and intrahepatic bile duct: Secondary | ICD-10-CM | POA: Diagnosis not present

## 2018-11-24 DIAGNOSIS — D6481 Anemia due to antineoplastic chemotherapy: Secondary | ICD-10-CM | POA: Diagnosis not present

## 2018-11-24 DIAGNOSIS — C3490 Malignant neoplasm of unspecified part of unspecified bronchus or lung: Secondary | ICD-10-CM

## 2018-11-24 DIAGNOSIS — Z5111 Encounter for antineoplastic chemotherapy: Secondary | ICD-10-CM | POA: Diagnosis not present

## 2018-11-24 MED ORDER — HEPARIN SOD (PORK) LOCK FLUSH 100 UNIT/ML IV SOLN
500.0000 [IU] | Freq: Once | INTRAVENOUS | Status: AC | PRN
  Administered 2018-11-24: 500 [IU]

## 2018-11-24 MED ORDER — SODIUM CHLORIDE 0.9 % IV SOLN
Freq: Once | INTRAVENOUS | Status: AC
  Administered 2018-11-24: 11:00:00 via INTRAVENOUS

## 2018-11-24 MED ORDER — TOPOTECAN HCL CHEMO INJECTION 4 MG
1.2000 mg/m2 | Freq: Once | INTRAVENOUS | Status: AC
  Administered 2018-11-24: 2.2 mg via INTRAVENOUS
  Filled 2018-11-24: qty 2.2

## 2018-11-24 MED ORDER — SODIUM CHLORIDE 0.9 % IV SOLN
Freq: Once | INTRAVENOUS | Status: AC
  Administered 2018-11-24: 11:00:00 via INTRAVENOUS
  Filled 2018-11-24: qty 2

## 2018-11-24 MED ORDER — SODIUM CHLORIDE 0.9% FLUSH: 10.0000 mL | INTRAVENOUS | Status: DC | PRN

## 2018-11-24 NOTE — Progress Notes (Signed)
Treatment given per orders. Patient tolerated it well without problems. Vitals stable and discharged home from clinic ambulatory. Follow up as scheduled.  

## 2018-11-24 NOTE — Patient Instructions (Signed)
[facility] Discharge Instructions for Patients Receiving Chemotherapy  Today you received the following chemotherapy agents   To help prevent nausea and vomiting after your treatment, we encourage you to take your nausea medication   If you develop nausea and vomiting that is not controlled by your nausea medication, call the clinic.   BELOW ARE SYMPTOMS THAT SHOULD BE REPORTED IMMEDIATELY:  *FEVER GREATER THAN 100.5 F  *CHILLS WITH OR WITHOUT FEVER  NAUSEA AND VOMITING THAT IS NOT CONTROLLED WITH YOUR NAUSEA MEDICATION  *UNUSUAL SHORTNESS OF BREATH  *UNUSUAL BRUISING OR BLEEDING  TENDERNESS IN MOUTH AND THROAT WITH OR WITHOUT PRESENCE OF ULCERS  *URINARY PROBLEMS  *BOWEL PROBLEMS  UNUSUAL RASH Items with * indicate a potential emergency and should be followed up as soon as possible.  Feel free to call the clinic should you have any questions or concerns. The clinic phone number is [phone].  Please show the CHEMO ALERT CARD at check-in to the Emergency Department and triage nurse.   

## 2018-11-25 ENCOUNTER — Inpatient Hospital Stay (HOSPITAL_COMMUNITY): Payer: Medicare PPO

## 2018-11-25 ENCOUNTER — Encounter (HOSPITAL_COMMUNITY): Payer: Self-pay

## 2018-11-25 VITALS — BP 119/48 | HR 54 | Temp 97.7°F | Resp 18

## 2018-11-25 DIAGNOSIS — C3490 Malignant neoplasm of unspecified part of unspecified bronchus or lung: Secondary | ICD-10-CM

## 2018-11-25 DIAGNOSIS — G893 Neoplasm related pain (acute) (chronic): Secondary | ICD-10-CM | POA: Diagnosis not present

## 2018-11-25 DIAGNOSIS — C3432 Malignant neoplasm of lower lobe, left bronchus or lung: Secondary | ICD-10-CM | POA: Diagnosis not present

## 2018-11-25 DIAGNOSIS — C787 Secondary malignant neoplasm of liver and intrahepatic bile duct: Secondary | ICD-10-CM | POA: Diagnosis not present

## 2018-11-25 DIAGNOSIS — Z5111 Encounter for antineoplastic chemotherapy: Secondary | ICD-10-CM | POA: Diagnosis not present

## 2018-11-25 DIAGNOSIS — Z79891 Long term (current) use of opiate analgesic: Secondary | ICD-10-CM | POA: Diagnosis not present

## 2018-11-25 DIAGNOSIS — Z5189 Encounter for other specified aftercare: Secondary | ICD-10-CM | POA: Diagnosis not present

## 2018-11-25 DIAGNOSIS — C3431 Malignant neoplasm of lower lobe, right bronchus or lung: Secondary | ICD-10-CM | POA: Diagnosis not present

## 2018-11-25 DIAGNOSIS — G629 Polyneuropathy, unspecified: Secondary | ICD-10-CM | POA: Diagnosis not present

## 2018-11-25 DIAGNOSIS — D6481 Anemia due to antineoplastic chemotherapy: Secondary | ICD-10-CM | POA: Diagnosis not present

## 2018-11-25 MED ORDER — TOPOTECAN HCL CHEMO INJECTION 4 MG
1.2000 mg/m2 | Freq: Once | INTRAVENOUS | Status: AC
  Administered 2018-11-25: 2.2 mg via INTRAVENOUS
  Filled 2018-11-25: qty 2.2

## 2018-11-25 MED ORDER — SODIUM CHLORIDE 0.9% FLUSH
10.0000 mL | INTRAVENOUS | Status: DC | PRN
  Administered 2018-11-25: 10 mL
  Filled 2018-11-25: qty 10

## 2018-11-25 MED ORDER — SODIUM CHLORIDE 0.9 % IV SOLN
Freq: Once | INTRAVENOUS | Status: AC
  Administered 2018-11-25: 10:00:00 via INTRAVENOUS
  Filled 2018-11-25: qty 2

## 2018-11-25 MED ORDER — HEPARIN SOD (PORK) LOCK FLUSH 100 UNIT/ML IV SOLN
500.0000 [IU] | Freq: Once | INTRAVENOUS | Status: AC | PRN
  Administered 2018-11-25: 500 [IU]

## 2018-11-25 MED ORDER — SODIUM CHLORIDE 0.9 % IV SOLN
Freq: Once | INTRAVENOUS | Status: AC
  Administered 2018-11-25: 09:00:00 via INTRAVENOUS

## 2018-11-25 NOTE — Patient Instructions (Signed)
[facility] Discharge Instructions for Patients Receiving Chemotherapy   Beginning January 23rd 2017 lab work for the [facility] will be done in the  Main lab at [facility] on 1st floor. If you have a lab appointment with the [facility] please come in thru the  Main Entrance and check in at the main information desk   Today you received the following chemotherapy agents Topotecan. Follow-up as scheduled. Call clinic for any questions or ocncerns  To help prevent nausea and vomiting after your treatment, we encourage you to take your nausea medication    If you develop nausea and vomiting, or diarrhea that is not controlled by your medication, call the clinic.  The clinic phone number is [phone]. Office hours are Monday-Friday 8:30am-5:00pm.  BELOW ARE SYMPTOMS THAT SHOULD BE REPORTED IMMEDIATELY:  *FEVER GREATER THAN 101.0 F  *CHILLS WITH OR WITHOUT FEVER  NAUSEA AND VOMITING THAT IS NOT CONTROLLED WITH YOUR NAUSEA MEDICATION  *UNUSUAL SHORTNESS OF BREATH  *UNUSUAL BRUISING OR BLEEDING  TENDERNESS IN MOUTH AND THROAT WITH OR WITHOUT PRESENCE OF ULCERS  *URINARY PROBLEMS  *BOWEL PROBLEMS  UNUSUAL RASH Items with * indicate a potential emergency and should be followed up as soon as possible. If you have an emergency after office hours please contact your primary care physician or go to the nearest emergency department.  Please call the clinic during office hours if you have any questions or concerns.   You may also contact the Patient Navigator at [phone] should you have any questions or need assistance in obtaining follow up care.      Resources For Cancer Patients and their Caregivers ? American Cancer Society: Can assist with transportation, wigs, general needs, runs Look Good Feel Better.        878-223-4722 ? Cancer Care: Provides financial assistance, online support groups, medication/co-pay assistance.  1-800-813-HOPE  323 443 3082) ? [facility] Assists [place] cancer patients and their families through emotional , educational and financial support.  (431) 875-2068 ? [place] DSS Where to apply for food stamps, Medicaid and utility assistance. 228-277-0045 ? [employer]: Transportation to medical appointments. 661-549-7410 ? Social Security Administration: May apply for disability if have a Stage IV cancer. 401-400-0355 253-616-7990 ? [employer], Disability and Transit Services: Assists with nutrition, care and transit needs. [phone]

## 2018-11-25 NOTE — Progress Notes (Signed)
Harvie Junior tolerated Topotecan infusion well without complaints or incident. Port left accessed and flushed for use tomorrow. VSS upon discharge. Pt discharged self ambulatory in satisfactory condition

## 2018-11-26 ENCOUNTER — Inpatient Hospital Stay (HOSPITAL_COMMUNITY): Payer: Medicare PPO

## 2018-11-26 ENCOUNTER — Other Ambulatory Visit: Payer: Self-pay

## 2018-11-26 VITALS — BP 124/54 | HR 50 | Temp 97.5°F | Resp 17 | Wt 153.0 lb

## 2018-11-26 DIAGNOSIS — G629 Polyneuropathy, unspecified: Secondary | ICD-10-CM | POA: Diagnosis not present

## 2018-11-26 DIAGNOSIS — C3490 Malignant neoplasm of unspecified part of unspecified bronchus or lung: Secondary | ICD-10-CM

## 2018-11-26 DIAGNOSIS — Z5111 Encounter for antineoplastic chemotherapy: Secondary | ICD-10-CM | POA: Diagnosis not present

## 2018-11-26 DIAGNOSIS — D6481 Anemia due to antineoplastic chemotherapy: Secondary | ICD-10-CM | POA: Diagnosis not present

## 2018-11-26 DIAGNOSIS — C3431 Malignant neoplasm of lower lobe, right bronchus or lung: Secondary | ICD-10-CM | POA: Diagnosis not present

## 2018-11-26 DIAGNOSIS — Z5189 Encounter for other specified aftercare: Secondary | ICD-10-CM | POA: Diagnosis not present

## 2018-11-26 DIAGNOSIS — G893 Neoplasm related pain (acute) (chronic): Secondary | ICD-10-CM | POA: Diagnosis not present

## 2018-11-26 DIAGNOSIS — C3432 Malignant neoplasm of lower lobe, left bronchus or lung: Secondary | ICD-10-CM | POA: Diagnosis not present

## 2018-11-26 DIAGNOSIS — C787 Secondary malignant neoplasm of liver and intrahepatic bile duct: Secondary | ICD-10-CM | POA: Diagnosis not present

## 2018-11-26 DIAGNOSIS — Z79891 Long term (current) use of opiate analgesic: Secondary | ICD-10-CM | POA: Diagnosis not present

## 2018-11-26 MED ORDER — SODIUM CHLORIDE 0.9 % IV SOLN
Freq: Once | INTRAVENOUS | Status: AC
  Administered 2018-11-26: 10:00:00 via INTRAVENOUS
  Filled 2018-11-26: qty 2

## 2018-11-26 MED ORDER — HEPARIN SOD (PORK) LOCK FLUSH 100 UNIT/ML IV SOLN
500.0000 [IU] | Freq: Once | INTRAVENOUS | Status: AC | PRN
  Administered 2018-11-26: 500 [IU]

## 2018-11-26 MED ORDER — SODIUM CHLORIDE 0.9 % IV SOLN
Freq: Once | INTRAVENOUS | Status: AC
  Administered 2018-11-26: 09:00:00 via INTRAVENOUS

## 2018-11-26 MED ORDER — SODIUM CHLORIDE 0.9% FLUSH
10.0000 mL | INTRAVENOUS | Status: DC | PRN
  Administered 2018-11-26: 10 mL
  Filled 2018-11-26: qty 10

## 2018-11-26 MED ORDER — TOPOTECAN HCL CHEMO INJECTION 4 MG
1.2000 mg/m2 | Freq: Once | INTRAVENOUS | Status: AC
  Administered 2018-11-26: 2.2 mg via INTRAVENOUS
  Filled 2018-11-26: qty 2.2

## 2018-11-26 MED ORDER — PEGFILGRASTIM 6 MG/0.6ML ~~LOC~~ PSKT
6.0000 mg | PREFILLED_SYRINGE | Freq: Once | SUBCUTANEOUS | Status: AC
  Administered 2018-11-26: 6 mg via SUBCUTANEOUS
  Filled 2018-11-26: qty 0.6

## 2018-11-26 NOTE — Progress Notes (Signed)
Treatment given today per MD orders. Tolerated infusion without adverse affects. Neulasta On Pro on R arm. Green light noted. Vital signs stable. No complaints at this time. Discharged from clinic ambulatory. F/U with [facility] as scheduled.

## 2018-11-26 NOTE — Patient Instructions (Signed)
[facility] Discharge Instructions for Patients Receiving Chemotherapy  Today you received the following chemotherapy agents   To help prevent nausea and vomiting after your treatment, we encourage you to take your nausea medication   If you develop nausea and vomiting that is not controlled by your nausea medication, call the clinic.   BELOW ARE SYMPTOMS THAT SHOULD BE REPORTED IMMEDIATELY:  *FEVER GREATER THAN 100.5 F  *CHILLS WITH OR WITHOUT FEVER  NAUSEA AND VOMITING THAT IS NOT CONTROLLED WITH YOUR NAUSEA MEDICATION  *UNUSUAL SHORTNESS OF BREATH  *UNUSUAL BRUISING OR BLEEDING  TENDERNESS IN MOUTH AND THROAT WITH OR WITHOUT PRESENCE OF ULCERS  *URINARY PROBLEMS  *BOWEL PROBLEMS  UNUSUAL RASH Items with * indicate a potential emergency and should be followed up as soon as possible.  Feel free to call the clinic should you have any questions or concerns. The clinic phone number is [phone].  Please show the CHEMO ALERT CARD at check-in to the Emergency Department and triage nurse.   

## 2018-11-30 ENCOUNTER — Other Ambulatory Visit: Payer: Self-pay

## 2018-11-30 ENCOUNTER — Ambulatory Visit (INDEPENDENT_AMBULATORY_CARE_PROVIDER_SITE_OTHER): Payer: Medicare PPO | Admitting: Family Medicine

## 2018-11-30 DIAGNOSIS — F5101 Primary insomnia: Secondary | ICD-10-CM

## 2018-11-30 MED ORDER — HYDROXYZINE HCL 50 MG PO TABS: 50.0000 mg | ORAL_TABLET | Freq: Every evening | ORAL | 0 refills | Status: DC | PRN

## 2018-11-30 NOTE — Progress Notes (Signed)
Subjective:    Patient ID: [name], male    DOB: 09-01-1950, 68 y.o.   MRN: [id number]  HPI 11/09/18 Patient is being seen today as a phone visit.  He consents to be seen over the telephone.  Phone call began at 859.  Phone call ended at 910.  Patient has a longstanding history of anxiety disorder.  Prior to becoming my patient, the patient was on Xanax 1 mg 4 times a day.  There is been no evidence of abuse or diversion.  He takes the medication at 6 AM, lunchtime, 6 PM, and then at bedtime which is 12 AM for him.  He has been on this medication for 15 years and up until recently it has worked well for him.  Unfortunately he has been diagnosed with stage IV metastatic lung cancer.  Patient states that at night he is no longer able to sleep.  He had tried trazodone and increase the medication up to 400 mg in the past without any benefit so he discontinued the medication.  At night he will typically go to bed at 12:00.  It usually takes him 30 minutes to fall asleep.  After he falls asleep he will quickly wake up approximately 1 hour later.  Often he wakes up at 1:30 in the morning or 2:00 in the morning.  It will then take him another hour or so to fall back asleep.  Often he has to get out of bed and watch TV or read a book.  He is getting less than 2 hours of sleep per evening.  This leaves him feeling extremely tired and drained throughout the day.  Usually when he wakes up, his mind will start to perseverate on his lung cancer and anxiety.  His thoughts will race and he will feel very isolated and alone and scared at night.  Tramadol is on his medication list however he denies taking any tramadol.  He does take Percocet however he only takes 1 tablet of Percocet per day.  He takes this in the morning when he gets out of bed.  He is not drinking any caffeine after 4:00 in the afternoon.  He denies taking any other stimulants.  He denies electronics in the bedroom.  Due to pain he is not able to  exercise.  Pain is not keeping him awake at night.  He denies any depression or racing thoughts or tangential speech or delusions or hallucinations or evidence of mania.  He denies any suicidal thoughts.  At that time, my plan was: We had a long discussion today about medication interactions and potential for overdose.  I recommended not taking Percocet at bedtime due to its potential interaction in combination with Xanax or any other sleeping pills which can potentially cause respiratory depression.  Patient will not take Percocet other than first thing in the morning.  I recommended decreasing his Xanax from 4 times a day to 3 times a day.  He will take this at 6 in the morning, around lunchtime, and around dinnertime.  He will discontinue his bedtime dose.  Cleda Mccreedy, we will replace his nighttime dose of Xanax with temazepam 15 mg prior to bedtime and see if this will work better for him helping him sleep due to its longer half-life and the fact he is nave to the medication  11/30/18 Patient is being seen today as a phone visit again regarding his insomnia.  Phone call began at 3:00.  Phone call ended at  315.  Patient consents to be seen by telephone.  The changes we made at his last visit decreasing his Xanax to 3 times a day and starting temazepam at night have given him very little relief.  He is still taking more than an hour to fall asleep at night.  He often lies in bed tossing and turning unable to sleep.  He will sometimes get 3 hours of sleep at best.  He wakes up at 4 in the morning and thereafter he is unable to fall back asleep.  He then feels extremely tired throughout the day and is constantly yawning with very little energy.  He is not taking naps.  He will go to bed at 11:00 and not fall asleep until 1230 or 1 and then the cycle repeats.  Despite his best efforts to improve his sleep hygiene, nothing seems to be helping him.  He would like to discuss other options to help him sleep Past Medical  History:  Diagnosis Date   Anxiety    CAD (coronary artery disease)    Cancer ([place])    stage 4 small cell lung cancer    COPD (chronic obstructive pulmonary disease) (HCC)    Depression    Dyspnea    increased exertion   Feeling of chest tightness    Heart palpitations    History of chemotherapy    Myocardial infarction ([place])    Osteopenia    Panic attacks    Smoker    Past Surgical History:  Procedure Laterality Date   BACK SURGERY  12/24/2000   L5,S1   CORONARY STENT PLACEMENT  2005   RCA & CX   HERNIA REPAIR Right 1980's   INGUINAL HERNIA REPAIR  12/1978   right side   IR FLUORO GUIDE PORT INSERTION RIGHT  04/02/2017   IR US GUIDE BX ASP/DRAIN  02/03/2017   IR US GUIDE VASC ACCESS RIGHT  04/02/2017   NM MYOCAR PERF WALL MOTION  09/07/2009   No ischemia; EF 51%   SHOULDER SURGERY Left 08/2010   SPINE SURGERY  2002   L5-S1   Current Outpatient Medications on File Prior to Visit  Medication Sig Dispense Refill   ALPRAZolam (XANAX) 1 MG tablet TAKE (1) TABLET BY MOUTH (3) TIMES DAILY. Do not take with temazepam at night (Patient taking differently: 4 (four) times daily. TAKE (1) TABLET BY MOUTH (4) TIMES DAILY.) 90 tablet 0   aspirin EC 81 MG tablet Take 81 mg by mouth daily.     atorvastatin (LIPITOR) 40 MG tablet TAKE (1) TABLET BY MOUTH ONCE DAILY. 90 tablet 0   clotrimazole-betamethasone (LOTRISONE) cream Apply 1 application topically 2 (two) times daily. 30 g 0   cyclobenzaprine (FLEXERIL) 5 MG tablet TAKE 1 TABLET THREE TIMES DAILY AS NEEDED FOR MUSCLE SPASMS. 30 tablet 0   gabapentin (NEURONTIN) 300 MG capsule TAKE (1) CAPSULE BY MOUTH TWICE DAILY. 60 capsule 0   lidocaine-prilocaine (EMLA) cream Apply to affected area once 30 g 3   ondansetron (ZOFRAN) 8 MG tablet Take 1 tablet (8 mg total) by mouth 2 (two) times daily as needed. Start on the third day after cisplatin chemotherapy. 30 tablet 1   oxyCODONE-acetaminophen (PERCOCET/ROXICET)  5-325 MG tablet TAKE 1 TABLET TWICE DAILY AS NEEDED FOR SEVERE PAIN. 60 tablet 0   Pegfilgrastim (NEULASTA ONPRO [place]) Inject into the skin. Every 21 days     polyethylene glycol powder (GLYCOLAX/MIRALAX) powder Take 17 g by mouth daily. 3350 g 2  prochlorperazine (COMPAZINE) 10 MG tablet Take 1 tablet (10 mg total) by mouth every 6 (six) hours as needed (Nausea or vomiting). 60 tablet 1   TOPOTECAN HCL IV Inject into the vein.     topotecan in sodium chloride 0.9 % 100 mL Inject into the vein once. Days 1-5 every 21 days     traMADol (ULTRAM) 50 MG tablet TAKE (1) TABLET BY MOUTH (3) TIMES DAILY. 30 tablet 0   No current facility-administered medications on file prior to visit.    Allergies  Allergen Reactions   Codeine Nausea Only   Niaspan [Niacin Er]    Social History   Socioeconomic History   Marital status: Single    Spouse name: Not on file   Number of children: Not on file   Years of education: Not on file   Highest education level: Not on file  Occupational History   Not on file  Social Needs   Financial resource strain: Not on file   Food insecurity:    Worry: Not on file    Inability: Not on file   Transportation needs:    Medical: Not on file    Non-medical: Not on file  Tobacco Use   Smoking status: Current Every Day Smoker    Packs/day: 1.00    Types: Cigarettes   Smokeless tobacco: Never Used  Substance and Sexual Activity   Alcohol use: Yes    Comment: occas   Drug use: No   Sexual activity: Not on file  Lifestyle   Physical activity:    Days per week: Not on file    Minutes per session: Not on file   Stress: Not on file  Relationships   Social connections:    Talks on phone: Not on file    Gets together: Not on file    Attends religious service: Not on file    Active member of club or organization: Not on file    Attends meetings of clubs or organizations: Not on file    Relationship status: Not on file   Intimate  partner violence:    Fear of current or ex partner: Not on file    Emotionally abused: Not on file    Physically abused: Not on file    Forced sexual activity: Not on file  Other Topics Concern   Not on file  Social History Narrative   Not on file      Review of Systems  All other systems reviewed and are negative.      Objective:   Physical Exam  No physical exam was performed today as the patient was seen over the telephone.       Assessment & Plan:  Primary insomnia  Primary insomnia  I am hesitant to increase any benzodiazepine or add more sedating medication given the amount of Xanax he is taking along with his pain medication out of concern for respiratory depression.  He is also on gabapentin.  Therefore I have recommended trying hydroxyzine 50 mg p.o. nightly in addition to using his Xanax 4 times a day.  Therefore the patient will increase his Xanax back to his original dose which was 1 mg 4 times a day and take 50 mg of hydroxyzine at bedtime.  He will discontinue temazepam.  We also discussed potentially trying Seroquel instead off label for insomnia.  Another option would be Belsomra.  However we will try hydroxyzine first as I believe this is most low risk proposition for this patient

## 2018-12-06 ENCOUNTER — Other Ambulatory Visit: Payer: Self-pay | Admitting: Family Medicine

## 2018-12-06 DIAGNOSIS — F411 Generalized anxiety disorder: Secondary | ICD-10-CM

## 2018-12-06 DIAGNOSIS — F5101 Primary insomnia: Secondary | ICD-10-CM

## 2018-12-07 ENCOUNTER — Other Ambulatory Visit (HOSPITAL_COMMUNITY): Payer: Self-pay | Admitting: Nurse Practitioner

## 2018-12-07 DIAGNOSIS — C3490 Malignant neoplasm of unspecified part of unspecified bronchus or lung: Secondary | ICD-10-CM

## 2018-12-07 NOTE — Telephone Encounter (Signed)
Pt is requesting refill on Xanax   LOV: 11/30/18  LRF: 11/09/18

## 2018-12-13 ENCOUNTER — Other Ambulatory Visit (HOSPITAL_COMMUNITY): Payer: Self-pay | Admitting: Nurse Practitioner

## 2018-12-13 DIAGNOSIS — G620 Drug-induced polyneuropathy: Secondary | ICD-10-CM

## 2018-12-13 DIAGNOSIS — T451X5A Adverse effect of antineoplastic and immunosuppressive drugs, initial encounter: Secondary | ICD-10-CM

## 2018-12-13 DIAGNOSIS — C3490 Malignant neoplasm of unspecified part of unspecified bronchus or lung: Secondary | ICD-10-CM

## 2018-12-17 ENCOUNTER — Other Ambulatory Visit: Payer: Self-pay

## 2018-12-20 ENCOUNTER — Inpatient Hospital Stay (HOSPITAL_COMMUNITY): Payer: Medicare PPO

## 2018-12-20 ENCOUNTER — Other Ambulatory Visit: Payer: Self-pay

## 2018-12-20 ENCOUNTER — Inpatient Hospital Stay (HOSPITAL_COMMUNITY): Payer: Medicare PPO | Attending: Hematology

## 2018-12-20 ENCOUNTER — Inpatient Hospital Stay (HOSPITAL_BASED_OUTPATIENT_CLINIC_OR_DEPARTMENT_OTHER): Payer: Medicare PPO | Admitting: Hematology

## 2018-12-20 ENCOUNTER — Encounter (HOSPITAL_COMMUNITY): Payer: Self-pay | Admitting: Hematology

## 2018-12-20 VITALS — BP 119/51 | HR 50 | Temp 97.8°F | Resp 18

## 2018-12-20 DIAGNOSIS — F17210 Nicotine dependence, cigarettes, uncomplicated: Secondary | ICD-10-CM | POA: Insufficient documentation

## 2018-12-20 DIAGNOSIS — G629 Polyneuropathy, unspecified: Secondary | ICD-10-CM

## 2018-12-20 DIAGNOSIS — C3432 Malignant neoplasm of lower lobe, left bronchus or lung: Secondary | ICD-10-CM | POA: Insufficient documentation

## 2018-12-20 DIAGNOSIS — T451X5A Adverse effect of antineoplastic and immunosuppressive drugs, initial encounter: Secondary | ICD-10-CM | POA: Insufficient documentation

## 2018-12-20 DIAGNOSIS — D6481 Anemia due to antineoplastic chemotherapy: Secondary | ICD-10-CM | POA: Insufficient documentation

## 2018-12-20 DIAGNOSIS — C3490 Malignant neoplasm of unspecified part of unspecified bronchus or lung: Secondary | ICD-10-CM

## 2018-12-20 DIAGNOSIS — Z5111 Encounter for antineoplastic chemotherapy: Secondary | ICD-10-CM | POA: Diagnosis not present

## 2018-12-20 DIAGNOSIS — Z7982 Long term (current) use of aspirin: Secondary | ICD-10-CM | POA: Diagnosis not present

## 2018-12-20 DIAGNOSIS — C3431 Malignant neoplasm of lower lobe, right bronchus or lung: Secondary | ICD-10-CM | POA: Diagnosis not present

## 2018-12-20 DIAGNOSIS — G893 Neoplasm related pain (acute) (chronic): Secondary | ICD-10-CM

## 2018-12-20 DIAGNOSIS — Z1589 Genetic susceptibility to other disease: Secondary | ICD-10-CM | POA: Diagnosis not present

## 2018-12-20 DIAGNOSIS — C787 Secondary malignant neoplasm of liver and intrahepatic bile duct: Secondary | ICD-10-CM | POA: Insufficient documentation

## 2018-12-20 DIAGNOSIS — Z79899 Other long term (current) drug therapy: Secondary | ICD-10-CM | POA: Insufficient documentation

## 2018-12-20 LAB — CBC WITH DIFFERENTIAL/PLATELET
Abs Immature Granulocytes: 0.03 10*3/uL (ref 0.00–0.07)
Basophils Absolute: 0.1 10*3/uL (ref 0.0–0.1)
Basophils Relative: 1 %
Eosinophils Absolute: 0.1 10*3/uL (ref 0.0–0.5)
Eosinophils Relative: 2 %
HCT: 29.8 % — ABNORMAL LOW (ref 39.0–52.0)
Hemoglobin: 9.6 g/dL — ABNORMAL LOW (ref 13.0–17.0)
Immature Granulocytes: 1 %
Lymphocytes Relative: 20 %
Lymphs Abs: 1.3 10*3/uL (ref 0.7–4.0)
MCH: 31 pg (ref 26.0–34.0)
MCHC: 32.2 g/dL (ref 30.0–36.0)
MCV: 96.1 fL (ref 80.0–100.0)
Monocytes Absolute: 0.8 10*3/uL (ref 0.1–1.0)
Monocytes Relative: 12 %
Neutro Abs: 4.3 10*3/uL (ref 1.7–7.7)
Neutrophils Relative %: 64 %
Platelets: 375 10*3/uL (ref 150–400)
RBC: 3.1 MIL/uL — ABNORMAL LOW (ref 4.22–5.81)
RDW: 23.3 % — ABNORMAL HIGH (ref 11.5–15.5)
WBC: 6.6 10*3/uL (ref 4.0–10.5)
nRBC: 0 % (ref 0.0–0.2)

## 2018-12-20 LAB — COMPREHENSIVE METABOLIC PANEL
ALT: 13 U/L (ref 0–44)
AST: 14 U/L — ABNORMAL LOW (ref 15–41)
Albumin: 4 g/dL (ref 3.5–5.0)
Alkaline Phosphatase: 55 U/L (ref 38–126)
Anion gap: 11 (ref 5–15)
BUN: 16 mg/dL (ref 8–23)
CO2: 22 mmol/L (ref 22–32)
Calcium: 8.9 mg/dL (ref 8.9–10.3)
Chloride: 104 mmol/L (ref 98–111)
Creatinine, Ser: 0.77 mg/dL (ref 0.61–1.24)
GFR calc Af Amer: >60 mL/min (ref >60)
GFR calc non Af Amer: >60 mL/min (ref >60)
Glucose, Bld: 100 mg/dL — ABNORMAL HIGH (ref 70–99)
Potassium: 4 mmol/L (ref 3.5–5.1)
Sodium: 137 mmol/L (ref 135–145)
Total Bilirubin: 0.3 mg/dL (ref 0.3–1.2)
Total Protein: 6.7 g/dL (ref 6.5–8.1)

## 2018-12-20 MED ORDER — HEPARIN SOD (PORK) LOCK FLUSH 100 UNIT/ML IV SOLN
500.0000 [IU] | Freq: Once | INTRAVENOUS | Status: AC | PRN
  Administered 2018-12-20: 500 [IU]

## 2018-12-20 MED ORDER — SODIUM CHLORIDE 0.9 % IV SOLN
Freq: Once | INTRAVENOUS | Status: AC
  Administered 2018-12-20: 11:00:00 via INTRAVENOUS
  Filled 2018-12-20: qty 2

## 2018-12-20 MED ORDER — SODIUM CHLORIDE 0.9 % IV SOLN
Freq: Once | INTRAVENOUS | Status: AC
  Administered 2018-12-20: 10:00:00 via INTRAVENOUS

## 2018-12-20 MED ORDER — SODIUM CHLORIDE 0.9% FLUSH
10.0000 mL | INTRAVENOUS | Status: DC | PRN
  Administered 2018-12-20: 10 mL
  Filled 2018-12-20: qty 10

## 2018-12-20 MED ORDER — TOPOTECAN HCL CHEMO INJECTION 4 MG
1.2000 mg/m2 | Freq: Once | INTRAVENOUS | Status: AC
  Administered 2018-12-20: 2.2 mg via INTRAVENOUS
  Filled 2018-12-20: qty 2.2

## 2018-12-20 NOTE — Progress Notes (Signed)
Pt presents today for physician appointment and treatment. VSS. Pt has no complaints of any pain or changes since the last visit. MAR reviewed.   Treatment given today per MD orders. Tolerated infusion without adverse affects. Vital signs stable. No complaints at this time. Discharged from clinic ambulatory. F/U with [facility] as scheduled.

## 2018-12-20 NOTE — Patient Instructions (Addendum)
[facility] Discharge Instructions  You were seen today by Dr. Katragadda. He went over your recent lab results. He will see you back in 4 weeks for labs and follow up.   Thank you for choosing [facility] at [facility] to provide your oncology and hematology care.  To afford each patient quality time with our provider, please arrive at least 15 minutes before your scheduled appointment time.   If you have a lab appointment with the Cancer Center please come in thru the  Main Entrance and check in at the main information desk  You need to re-schedule your appointment should you arrive 10 or more minutes late.  We strive to give you quality time with our providers, and arriving late affects you and other patients whose appointments are after yours.  Also, if you no show three or more times for appointments you may be dismissed from the clinic at the providers discretion.     Again, thank you for choosing [facility].  Our hope is that these requests will decrease the amount of time that you wait before being seen by our physicians.       _____________________________________________________________  Should you have questions after your visit to [facility], please contact our office at [phone] between the hours of 8:00 a.m. and 4:30 p.m.  Voicemails left after 4:00 p.m. will not be returned until the following business day.  For prescription refill requests, have your pharmacy contact our office and allow 72 hours.    Cancer Center Support Programs:   > Cancer Support Group  2nd Tuesday of the month 1pm-2pm, Journey Room    

## 2018-12-20 NOTE — Assessment & Plan Note (Addendum)
1.  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  - PET/CT scan on 08/13/2017 showing progression with bilateral lung nodules and mediastinal adenopathy -Topotecan (1.5 mg/m square) for 5 days every 21 days started on 08/24/2017.  -He gets brain MRI done in [place] periodically.  Last scan was negative for metastatic disease.  -Topotecan Dose reduced to 1.2 mg/m during cycle 4.  It was also increased to every 4 weeks for better tolerability. -Last Topotecan was on 11/22/2018. -CT CAP on 11/18/2018 showed dominant right lower lobe pulmonary nodule stable measuring 1.7 x 0.8 cm.  Left lower lobe pulmonary nodule measures 1.6 x 0.8 cm.  Overall stable disease with no new progressive findings. - He has tolerated last cycle very well.  He had some days of tiredness.  Otherwise no major side effects noted.  Chronic cough is stable. -I have reviewed his blood work.  He may proceed with his next cycle of chemotherapy today.  I will reevaluate him in 4 weeks.  2.  Normocytic anemia: -This is chemotherapy-induced.  Last Feraheme infusion was on 10/19/2017. -Hemoglobin is between 8-9 and stable.  3.  Peripheral neuropathy: -he will continue gabapentin 300 mg twice daily.  4.  Cancer related pain: -He was taking Percocet 5 mg 1 and half tablet daily.  It is not helping. -I have told him to increase it to twice daily.

## 2018-12-20 NOTE — Progress Notes (Addendum)
[name], [place] [address]   CLINIC:  Medical Oncology/Hematology  PCP:  David Frizzle, MD [address] 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 Bulky 5.4 cm right supraclavicular region malignant lymph node conglomeration with extracapsular extension. 2. Surrounding smaller abnormal right level 3 and level 5 lymph nodes, and the lymphadenopathy continues into the superior mediastinum, see Chest CT findings reported separately. 3. No other metastatic disease identified in the neck.    01/16/2017 Imaging    CT chest: IMPRESSION: 1. Extensive lymphadenopathy in the thorax and lower right cervical region, as discussed above. Primary differential considerations include lymphoma/leukemia or small cell carcinoma of the lung. Further evaluation a PET-CT could be considered to assess for additional sites of disease below the diaphragm if clinically appropriate. Additionally, ultrasound-guided biopsy of supraclavicular lymphadenopathy could be considered to establish a tissue diagnosis. 2. Indeterminate lesion in the periphery of segment 8 of the liver measuring 2.7 x 1.7 cm. Attention at time of follow-up PET-CT is recommended. 3. Aortic atherosclerosis, in addition to left main and 3 vessel coronary artery disease. Please note that although the presence of coronary artery calcium documents the presence of coronary artery disease, the severity of this disease and any potential stenosis cannot be assessed on this non-gated CT examination. Assessment for potential risk factor modification, dietary therapy or pharmacologic therapy may be warranted, if clinically indicated. 4. There are calcifications of the aortic valve.  Echocardiographic correlation for evaluation of potential valvular dysfunction may be warranted if clinically indicated. 5. Diffuse bronchial wall thickening with moderate centrilobular and paraseptal emphysema; imaging findings suggestive of underlying COPD.    02/03/2017 Initial Biopsy    (R) neck lymph node biopsy: SMALL CELL CARCINOMA (most likely lung primary).     02/03/2017 Miscellaneous    Port-a-cath attempted by IR; unable to place d/t enlarged SVC.     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 Staging for metastatic disease  EXAM: MRI HEAD WITHOUT AND WITH CONTRAST  TECHNIQUE: Multiplanar, multiecho pulse sequences of the brain and surrounding structures were obtained without and with intravenous contrast.  CONTRAST:  35m MULTIHANCE GADOBENATE DIMEGLUMINE 529 MG/ML IV SOLN  COMPARISON:  None.  FINDINGS: Brain: Negative for hydrocephalus. Cerebral volume normal for age. Small nonenhancing white matter hyperintensities consistent with mild chronic microvascular ischemia. No acute infarct. Negative for hemorrhage or mass or edema  Normal enhancement postcontrast infusion. No enhancing mass lesion. Leptomeningeal enhancement is normal.  Vascular: Normal arterial flow voids.  Normal venous enhancement  Skull and upper cervical spine: Negative  Sinuses/Orbits: Negative  Other: None  IMPRESSION: Negative for metastatic disease.  No acute abnormality.  Mild chronic white matter changes.    04/07/2017 Imaging     PET:  1. Marked reduction in size and metabolic activity of bulky RIGHT supraclavicular adenopathy mediastinal lymphadenopathy. 2. Residual moderate activity remains within small RIGHT supraclavicular lymph node, RIGHT lower  paratracheal lymph node and RIGHT hilar lymph node. 3. Resolution of prevascular and  internal mammary mediastinal metastatic hypermetabolic activity. 4. Resolution of metabolic activity associated with solitary RIGHT hepatic lobe liver metastasis. 5. No evidence of disease progression. 6. 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    05/27/2017 Imaging    MRI brain w/ and w/o contrast IMPRESSION: 1. No metastatic disease identified. 2. Increased nonspecific cerebral white matter signal changes since August. These are most commonly small vessel disease related. 3. New right maxillary sinusitis. Benign appearing retention cysts in the nasopharynx with trace mastoid effusions.    06/12/2017 Imaging    PET-CT IMPRESSION: 1. There are two new hypermetabolic nodules identified within both lower lobes measuring up to 3.1 cm. The appearance is nonspecific and may be inflammatory/infectious in etiology. Pulmonary metastatic disease cannot be excluded and short-term follow-up imaging in 3 months is advised to reassess these nodules. 2. Stable appearance of mild hypermetabolic activity associated with right paratracheal and right hilar lymph nodes. 3. Decrease in FDG uptake associated with index right supraclavicular lymph node. 4. 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 5. Aortic Atherosclerosis (ICD10-I70.0) and Emphysema (ICD10-J43.9).    08/24/2017 -  Chemotherapy    The patient had pegfilgrastim (NEULASTA ONPRO KIT) injection 6 mg, 6 mg, Subcutaneous, Once, 19 of 19 cycles Administration: 6 mg (08/28/2017), 6 mg (09/18/2017), 6 mg (10/09/2017), 6 mg (11/02/2017), 6 mg (11/20/2017), 6 mg (12/18/2017), 6 mg (01/15/2018), 6 mg (02/12/2018), 6 mg (03/12/2018), 6 mg (04/09/2018), 6 mg (05/07/2018), 6 mg  (06/04/2018), 6 mg (07/02/2018), 6 mg (07/30/2018), 6 mg (08/27/2018), 6 mg (09/24/2018), 6 mg (10/29/2018), 6 mg (11/26/2018) topotecan (HYCAMTIN) 2.9 mg in sodium chloride 0.9 % 100 mL chemo infusion, 1.5 mg/m2 = 2.9 mg, Intravenous,  Once, 19 of 19 cycles Dose modification: 1.2 mg/m2 (80 % of original dose 1.5 mg/m2, Cycle 4, Reason: Dose Not Tolerated) Administration: 2.9 mg (08/24/2017), 2.9 mg (08/25/2017), 2.9 mg (08/28/2017), 2.9 mg (08/26/2017), 2.9 mg (08/27/2017), 2.9 mg (09/14/2017), 2.9 mg (09/15/2017), 2.9 mg (09/16/2017), 2.9 mg (09/17/2017), 2.9 mg (09/18/2017), 2.9 mg (10/05/2017), 2.9 mg (10/06/2017), 2.9 mg (10/07/2017), 2.9 mg (10/08/2017), 2.9 mg (10/09/2017), 2.3 mg (10/26/2017), 2.3 mg (10/27/2017), 2.3 mg (10/28/2017), 2.3 mg (10/29/2017), 2.3 mg (11/02/2017), 2.3 mg (11/16/2017), 2.3 mg (11/17/2017), 2.3 mg (11/18/2017), 2.3 mg (11/19/2017), 2.3 mg (11/20/2017), 2.3 mg (12/14/2017), 2.3 mg (12/15/2017), 2.3 mg (12/16/2017), 2.3 mg (12/17/2017), 2.3 mg (12/18/2017), 2.3 mg (01/11/2018), 2.3 mg (01/12/2018), 2.3 mg (01/13/2018), 2.3 mg (01/15/2018), 2.3 mg (02/08/2018), 2.3 mg (02/09/2018), 2.3 mg (02/10/2018), 2.3 mg (02/11/2018), 2.3 mg (02/12/2018), 2.3 mg (03/08/2018), 2.3 mg (03/09/2018), 2.3 mg (03/10/2018), 2.3 mg (03/11/2018), 2.3 mg (03/12/2018), 2.2 mg (04/05/2018), 2.2 mg (04/06/2018), 2.2 mg (04/07/2018), 2.2 mg (04/08/2018), 2.2 mg (04/09/2018), 2.2 mg (05/03/2018), 2.2 mg (05/04/2018), 2.2 mg (05/05/2018), 2.2 mg (05/06/2018), 2.2 mg (05/07/2018), 2.2 mg (05/31/2018), 2.2 mg (06/01/2018), 2.2 mg (06/02/2018), 2.2 mg (06/03/2018), 2.2 mg (06/04/2018), 2.2 mg (06/28/2018), 2.2 mg (06/29/2018), 2.2 mg (06/30/2018), 2.2 mg (07/01/2018), 2.2 mg (07/02/2018), 2.2 mg (07/26/2018), 2.2 mg (07/27/2018), 2.2 mg (07/28/2018), 2.2 mg (07/29/2018), 2.2 mg (07/30/2018), 2.2 mg (08/23/2018), 2.2 mg (08/24/2018), 2.2 mg (08/25/2018), 2.2 mg (08/26/2018), 2.2 mg (08/27/2018), 2.2 mg (09/20/2018), 2.2 mg (09/21/2018), 2.2 mg (09/22/2018), 2.2 mg (09/23/2018), 2.2 mg (09/24/2018),  2.2 mg (10/25/2018), 2.2 mg (10/26/2018), 2.2 mg (10/27/2018), 2.2 mg (10/28/2018), 2.2 mg (10/29/2018), 2.2 mg (11/22/2018), 2.2 mg (11/23/2018), 2.2 mg (11/24/2018),  2.2 mg (11/25/2018), 2.2 mg (11/26/2018), 2.2 mg (12/20/2018) ondansetron (ZOFRAN) 4 mg in sodium chloride 0.9 % 50 mL IVPB, , Intravenous,  Once, 14 of 14 cycles Administration:  (02/08/2018),  (02/09/2018),  (02/10/2018),  (02/11/2018),  (02/12/2018),  (03/08/2018),  (03/09/2018),  (03/10/2018),  (03/11/2018),  (03/12/2018),  (04/05/2018),  (04/06/2018),  (04/07/2018),  (04/08/2018),  (04/09/2018),  (05/03/2018),  (05/04/2018),  (05/05/2018),  (05/06/2018),  (05/07/2018),  (05/31/2018),  (06/01/2018),  (06/02/2018),  (06/03/2018),  (06/04/2018),  (06/28/2018),  (06/29/2018),  (06/30/2018),  (07/01/2018),  (07/02/2018),  (07/26/2018),  (07/27/2018),  (07/28/2018),  (07/29/2018),  (07/30/2018),  (08/23/2018),  (08/24/2018),  (08/25/2018),  (08/26/2018),  (08/27/2018),  (09/20/2018),  (09/21/2018),  (09/22/2018),  (09/23/2018),  (09/24/2018),  (10/25/2018),  (10/26/2018),  (10/27/2018),  (10/28/2018),  (10/29/2018),  (11/22/2018),  (11/23/2018),  (11/24/2018),  (11/25/2018),  (11/26/2018),  (12/20/2018)  for chemotherapy treatment.       CANCER STAGING: Cancer Staging No matching staging information was found for the patient.   INTERVAL HISTORY:  Mr. Jaco 68 y.o. male returns for routine follow-up and consideration for next cycle of chemotherapy.  His last treatment was on 11/21/2016.  Occasional cough, numbness in the feet has been stable.  Denies any recent hospitalizations or ER visits.  Denies any nausea, vomiting, or diarrhea. Denies any new pains. Had not noticed any recent bleeding such as epistaxis, hematuria or hematochezia. Denies recent chest pain on exertion, shortness of breath on minimal exertion, pre-syncopal episodes, or palpitations.  Denies any recent fevers, infections, or recent hospitalizations. Patient reports appetite at 25 % and energy level at 75 %.     REVIEW OF  SYSTEMS:  Review of Systems  Respiratory: Positive for cough.   Neurological: Positive for numbness.  Psychiatric/Behavioral: Positive for sleep disturbance.  All other systems reviewed and are negative.    PAST MEDICAL/SURGICAL HISTORY:  Past Medical History:  Diagnosis Date   Anxiety    CAD (coronary artery disease)    Cancer (HCC)    stage 4 small cell lung cancer    COPD (chronic obstructive pulmonary disease) (HCC)    Depression    Dyspnea    increased exertion   Feeling of chest tightness    Heart palpitations    History of chemotherapy    Myocardial infarction ([place])    Osteopenia    Panic attacks    Smoker    Past Surgical History:  Procedure Laterality Date   BACK SURGERY  12/24/2000   L5,S1   CORONARY STENT PLACEMENT  2005   RCA & CX   HERNIA REPAIR Right 1980's   INGUINAL HERNIA REPAIR  12/1978   right side   IR FLUORO GUIDE PORT INSERTION RIGHT  04/02/2017   IR US GUIDE BX ASP/DRAIN  02/03/2017   IR US GUIDE VASC ACCESS RIGHT  04/02/2017   NM MYOCAR PERF WALL MOTION  09/07/2009   No ischemia; EF 51%   SHOULDER SURGERY Left 08/2010   SPINE SURGERY  2002   L5-S1     SOCIAL HISTORY:  Social History   Socioeconomic History   Marital status: Single    Spouse name: Not on file   Number of children: Not on file   Years of education: Not on file   Highest education level: Not on file  Occupational History   Not on file  Social Needs   Financial resource strain: Not on file   Food insecurity:    Worry: Not on file    Inability: Not on file   Transportation  needs:    Medical: Not on file    Non-medical: Not on file  Tobacco Use   Smoking status: Current Every Day Smoker    Packs/day: 1.00    Types: Cigarettes   Smokeless tobacco: Never Used  Substance and Sexual Activity   Alcohol use: Yes    Comment: occas   Drug use: No   Sexual activity: Not on file  Lifestyle   Physical activity:    Days per week: Not on  file    Minutes per session: Not on file   Stress: Not on file  Relationships   Social connections:    Talks on phone: Not on file    Gets together: Not on file    Attends religious service: Not on file    Active member of club or organization: Not on file    Attends meetings of clubs or organizations: Not on file    Relationship status: Not on file   Intimate partner violence:    Fear of current or ex partner: Not on file    Emotionally abused: Not on file    Physically abused: Not on file    Forced sexual activity: Not on file  Other Topics Concern   Not on file  Social History Narrative   Not on file    FAMILY HISTORY:  Family History  Problem Relation Age of Onset   Heart attack Father    Kidney disease Father        renal failure   Heart failure Mother    Heart attack Mother    Cancer Brother    Diabetes Brother    Alcohol abuse Brother    Diabetes Sister     CURRENT MEDICATIONS:  Outpatient Encounter Medications as of 12/20/2018  Medication Sig   ALPRAZolam (XANAX) 1 MG tablet TAKE (1) TABLET BY MOUTH (3) TIMES DAILY.   aspirin EC 81 MG tablet Take 81 mg by mouth daily.   atorvastatin (LIPITOR) 40 MG tablet TAKE (1) TABLET BY MOUTH ONCE DAILY.   clotrimazole-betamethasone (LOTRISONE) cream Apply 1 application topically 2 (two) times daily.   cyclobenzaprine (FLEXERIL) 5 MG tablet TAKE 1 TABLET THREE TIMES DAILY AS NEEDED FOR MUSCLE SPASMS.   gabapentin (NEURONTIN) 300 MG capsule TAKE (1) CAPSULE BY MOUTH TWICE DAILY.   hydrOXYzine (ATARAX/VISTARIL) 50 MG tablet Take 1 tablet (50 mg total) by mouth at bedtime as needed (sleep).   lidocaine-prilocaine (EMLA) cream Apply to affected area once   ondansetron (ZOFRAN) 8 MG tablet Take 1 tablet (8 mg total) by mouth 2 (two) times daily as needed. Start on the third day after cisplatin chemotherapy.   oxyCODONE-acetaminophen (PERCOCET/ROXICET) 5-325 MG tablet TAKE 1 TABLET TWICE DAILY AS NEEDED FOR  SEVERE PAIN.   Pegfilgrastim (NEULASTA ONPRO [place]) Inject into the skin. Every 21 days   polyethylene glycol powder (GLYCOLAX/MIRALAX) powder Take 17 g by mouth daily.   prochlorperazine (COMPAZINE) 10 MG tablet Take 1 tablet (10 mg total) by mouth every 6 (six) hours as needed (Nausea or vomiting).   TOPOTECAN HCL IV Inject into the vein.   topotecan in sodium chloride 0.9 % 100 mL Inject into the vein once. Days 1-5 every 21 days   traMADol (ULTRAM) 50 MG tablet TAKE (1) TABLET BY MOUTH (3) TIMES DAILY.   No facility-administered encounter medications on file as of 12/20/2018.     ALLERGIES:  Allergies  Allergen Reactions   Codeine Nausea Only   Niaspan [Niacin Er]  PHYSICAL EXAM:  ECOG Performance status: 1  Vitals:   12/20/18 0911  BP: (!) 137/52  Pulse: 65  Resp: 18  Temp: 97.9 F (36.6 C)  SpO2: 96%   Filed Weights   12/20/18 0911  Weight: 149 lb 6.4 oz (67.8 kg)    Physical Exam Vitals signs reviewed.  Constitutional:      Appearance: Normal appearance.  Cardiovascular:     Rate and Rhythm: Normal rate and regular rhythm.     Heart sounds: Normal heart sounds.  Pulmonary:     Effort: Pulmonary effort is normal.     Breath sounds: Normal breath sounds.  Abdominal:     General: There is no distension.     Palpations: Abdomen is soft. There is no mass.  Musculoskeletal:        General: No swelling.  Skin:    General: Skin is warm.  Neurological:     General: No focal deficit present.     Mental Status: He is alert and oriented to person, place, and time.  Psychiatric:        Mood and Affect: Mood normal.        Behavior: Behavior normal.      LABORATORY DATA:  I have reviewed the labs as listed.  CBC    Component Value Date/Time   WBC 6.6 12/20/2018 0902   RBC 3.10 (L) 12/20/2018 0902   HGB 9.6 (L) 12/20/2018 0902   HCT 29.8 (L) 12/20/2018 0902   PLT 375 12/20/2018 0902   MCV 96.1 12/20/2018 0902   MCH 31.0 12/20/2018 0902   MCHC  32.2 12/20/2018 0902   RDW 23.3 (H) 12/20/2018 0902   LYMPHSABS 1.3 12/20/2018 0902   MONOABS 0.8 12/20/2018 0902   EOSABS 0.1 12/20/2018 0902   BASOSABS 0.1 12/20/2018 0902   CMP Latest Ref Rng & Units 12/20/2018 11/22/2018 10/25/2018  Glucose 70 - 99 mg/dL 100(H) 89 98  BUN 8 - 23 mg/dL 16 16 17   Creatinine 0.61 - 1.24 mg/dL 0.77 0.71 0.68  Sodium 135 - 145 mmol/L 137 139 137  Potassium 3.5 - 5.1 mmol/L 4.0 3.9 3.8  Chloride 98 - 111 mmol/L 104 107 107  CO2 22 - 32 mmol/L 22 24 24   Calcium 8.9 - 10.3 mg/dL 8.9 8.7(L) 8.9  Total Protein 6.5 - 8.1 g/dL 6.7 6.4(L) 6.7  Total Bilirubin 0.3 - 1.2 mg/dL 0.3 0.5 0.9  Alkaline Phos 38 - 126 U/L 55 57 58  AST 15 - 41 U/L 14(L) 13(L) 18  ALT 0 - 44 U/L 13 12 13        DIAGNOSTIC IMAGING:  I have independently reviewed the scans and discussed with the patient.   I have reviewed Venita Lick LPN's note and agree with the documentation.  I personally performed a face-to-face visit, made revisions and my assessment and plan is as follows.    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  - PET/CT scan on 08/13/2017 showing progression with bilateral lung nodules and mediastinal adenopathy -Topotecan (1.5 mg/m square) for 5 days every 21 days started on 08/24/2017.  -He gets brain MRI done in [place] periodically.  Last scan was negative for metastatic disease.  -Topotecan Dose reduced to 1.2 mg/m during cycle 4.  It was also increased to every 4 weeks for better tolerability. -Last Topotecan was on 11/22/2018. -  CT CAP on 11/18/2018 showed dominant right lower lobe pulmonary nodule stable measuring 1.7 x 0.8 cm.  Left lower lobe pulmonary nodule measures 1.6 x 0.8 cm.  Overall stable disease with no new progressive findings. - He has tolerated last cycle very well.  He had some days of tiredness.   Otherwise no major side effects noted.  Chronic cough is stable. -I have reviewed his blood work.  He may proceed with his next cycle of chemotherapy today.  I will reevaluate him in 4 weeks.  2.  Normocytic anemia: -This is chemotherapy-induced.  Last Feraheme infusion was on 10/19/2017. -Hemoglobin is between 8-9 and stable.  3.  Peripheral neuropathy: -he will continue gabapentin 300 mg twice daily.  4.  Cancer related pain: -He was taking Percocet 5 mg 1 and half tablet daily.  It is not helping. -I have told him to increase it to twice daily.   Total time spent is 25 minutes with more than 50% of the time spent face-to-face discussing treatment plan and coordination of care.    Orders placed this encounter:  No orders of the defined types were placed in this encounter.     Derek Jack, MD [place] [phone]

## 2018-12-20 NOTE — Patient Instructions (Signed)
[facility] Discharge Instructions for Patients Receiving Chemotherapy  Today you received the following chemotherapy agents   To help prevent nausea and vomiting after your treatment, we encourage you to take your nausea medication   If you develop nausea and vomiting that is not controlled by your nausea medication, call the clinic.   BELOW ARE SYMPTOMS THAT SHOULD BE REPORTED IMMEDIATELY:  *FEVER GREATER THAN 100.5 F  *CHILLS WITH OR WITHOUT FEVER  NAUSEA AND VOMITING THAT IS NOT CONTROLLED WITH YOUR NAUSEA MEDICATION  *UNUSUAL SHORTNESS OF BREATH  *UNUSUAL BRUISING OR BLEEDING  TENDERNESS IN MOUTH AND THROAT WITH OR WITHOUT PRESENCE OF ULCERS  *URINARY PROBLEMS  *BOWEL PROBLEMS  UNUSUAL RASH Items with * indicate a potential emergency and should be followed up as soon as possible.  Feel free to call the clinic should you have any questions or concerns. The clinic phone number is [phone].  Please show the CHEMO ALERT CARD at check-in to the Emergency Department and triage nurse.   

## 2018-12-21 ENCOUNTER — Inpatient Hospital Stay (HOSPITAL_COMMUNITY): Payer: Medicare PPO

## 2018-12-21 VITALS — BP 128/45 | HR 54 | Temp 98.3°F | Resp 18 | Wt 148.4 lb

## 2018-12-21 DIAGNOSIS — Z1589 Genetic susceptibility to other disease: Secondary | ICD-10-CM | POA: Diagnosis not present

## 2018-12-21 DIAGNOSIS — C3432 Malignant neoplasm of lower lobe, left bronchus or lung: Secondary | ICD-10-CM | POA: Diagnosis not present

## 2018-12-21 DIAGNOSIS — D6481 Anemia due to antineoplastic chemotherapy: Secondary | ICD-10-CM | POA: Diagnosis not present

## 2018-12-21 DIAGNOSIS — C3431 Malignant neoplasm of lower lobe, right bronchus or lung: Secondary | ICD-10-CM | POA: Diagnosis not present

## 2018-12-21 DIAGNOSIS — C787 Secondary malignant neoplasm of liver and intrahepatic bile duct: Secondary | ICD-10-CM | POA: Diagnosis not present

## 2018-12-21 DIAGNOSIS — G893 Neoplasm related pain (acute) (chronic): Secondary | ICD-10-CM | POA: Diagnosis not present

## 2018-12-21 DIAGNOSIS — T451X5A Adverse effect of antineoplastic and immunosuppressive drugs, initial encounter: Secondary | ICD-10-CM | POA: Diagnosis not present

## 2018-12-21 DIAGNOSIS — C3490 Malignant neoplasm of unspecified part of unspecified bronchus or lung: Secondary | ICD-10-CM

## 2018-12-21 DIAGNOSIS — Z5111 Encounter for antineoplastic chemotherapy: Secondary | ICD-10-CM | POA: Diagnosis not present

## 2018-12-21 DIAGNOSIS — G629 Polyneuropathy, unspecified: Secondary | ICD-10-CM | POA: Diagnosis not present

## 2018-12-21 MED ORDER — HEPARIN SOD (PORK) LOCK FLUSH 100 UNIT/ML IV SOLN
500.0000 [IU] | Freq: Once | INTRAVENOUS | Status: AC | PRN
  Administered 2018-12-21: 11:00:00 500 [IU]

## 2018-12-21 MED ORDER — TOPOTECAN HCL CHEMO INJECTION 4 MG
1.2000 mg/m2 | Freq: Once | INTRAVENOUS | Status: AC
  Administered 2018-12-21: 11:00:00 2.2 mg via INTRAVENOUS
  Filled 2018-12-21: qty 2.2

## 2018-12-21 MED ORDER — SODIUM CHLORIDE 0.9 % IV SOLN
Freq: Once | INTRAVENOUS | Status: AC
  Administered 2018-12-21: 09:00:00 via INTRAVENOUS
  Filled 2018-12-21: qty 2

## 2018-12-21 MED ORDER — SODIUM CHLORIDE 0.9% FLUSH
10.0000 mL | INTRAVENOUS | Status: DC | PRN
  Administered 2018-12-21 (×2): 10 mL
  Filled 2018-12-21 (×2): qty 10

## 2018-12-21 MED ORDER — SODIUM CHLORIDE 0.9 % IV SOLN
Freq: Once | INTRAVENOUS | Status: AC
  Administered 2018-12-21: 09:00:00 via INTRAVENOUS

## 2018-12-21 NOTE — Progress Notes (Signed)
Treatment given today per MD orders. Tolerated infusion without adverse affects. Vital signs stable. No complaints at this time. Discharged from clinic ambulatory. F/U with [facility] as scheduled.   

## 2018-12-21 NOTE — Patient Instructions (Signed)
[facility] Discharge Instructions for Patients Receiving Chemotherapy  Today you received the following chemotherapy agents   To help prevent nausea and vomiting after your treatment, we encourage you to take your nausea medication   If you develop nausea and vomiting that is not controlled by your nausea medication, call the clinic.   BELOW ARE SYMPTOMS THAT SHOULD BE REPORTED IMMEDIATELY:  *FEVER GREATER THAN 100.5 F  *CHILLS WITH OR WITHOUT FEVER  NAUSEA AND VOMITING THAT IS NOT CONTROLLED WITH YOUR NAUSEA MEDICATION  *UNUSUAL SHORTNESS OF BREATH  *UNUSUAL BRUISING OR BLEEDING  TENDERNESS IN MOUTH AND THROAT WITH OR WITHOUT PRESENCE OF ULCERS  *URINARY PROBLEMS  *BOWEL PROBLEMS  UNUSUAL RASH Items with * indicate a potential emergency and should be followed up as soon as possible.  Feel free to call the clinic should you have any questions or concerns. The clinic phone number is [phone].  Please show the CHEMO ALERT CARD at check-in to the Emergency Department and triage nurse.   

## 2018-12-22 ENCOUNTER — Inpatient Hospital Stay (HOSPITAL_COMMUNITY): Payer: Medicare PPO

## 2018-12-22 ENCOUNTER — Other Ambulatory Visit: Payer: Self-pay

## 2018-12-22 VITALS — BP 119/46 | HR 51 | Temp 97.7°F | Resp 16 | Wt 150.8 lb

## 2018-12-22 DIAGNOSIS — Z5111 Encounter for antineoplastic chemotherapy: Secondary | ICD-10-CM | POA: Diagnosis not present

## 2018-12-22 DIAGNOSIS — C3431 Malignant neoplasm of lower lobe, right bronchus or lung: Secondary | ICD-10-CM | POA: Diagnosis not present

## 2018-12-22 DIAGNOSIS — D6481 Anemia due to antineoplastic chemotherapy: Secondary | ICD-10-CM | POA: Diagnosis not present

## 2018-12-22 DIAGNOSIS — T451X5A Adverse effect of antineoplastic and immunosuppressive drugs, initial encounter: Secondary | ICD-10-CM | POA: Diagnosis not present

## 2018-12-22 DIAGNOSIS — G893 Neoplasm related pain (acute) (chronic): Secondary | ICD-10-CM | POA: Diagnosis not present

## 2018-12-22 DIAGNOSIS — G629 Polyneuropathy, unspecified: Secondary | ICD-10-CM | POA: Diagnosis not present

## 2018-12-22 DIAGNOSIS — C787 Secondary malignant neoplasm of liver and intrahepatic bile duct: Secondary | ICD-10-CM | POA: Diagnosis not present

## 2018-12-22 DIAGNOSIS — Z1589 Genetic susceptibility to other disease: Secondary | ICD-10-CM | POA: Diagnosis not present

## 2018-12-22 DIAGNOSIS — C3490 Malignant neoplasm of unspecified part of unspecified bronchus or lung: Secondary | ICD-10-CM

## 2018-12-22 DIAGNOSIS — C3432 Malignant neoplasm of lower lobe, left bronchus or lung: Secondary | ICD-10-CM | POA: Diagnosis not present

## 2018-12-22 MED ORDER — SODIUM CHLORIDE 0.9% FLUSH
10.0000 mL | INTRAVENOUS | Status: DC | PRN
  Administered 2018-12-22: 10 mL
  Filled 2018-12-22: qty 10

## 2018-12-22 MED ORDER — SODIUM CHLORIDE 0.9 % IV SOLN
Freq: Once | INTRAVENOUS | Status: AC
  Administered 2018-12-22: 14:00:00 via INTRAVENOUS

## 2018-12-22 MED ORDER — HEPARIN SOD (PORK) LOCK FLUSH 100 UNIT/ML IV SOLN
500.0000 [IU] | Freq: Once | INTRAVENOUS | Status: AC | PRN
  Administered 2018-12-22: 15:00:00 500 [IU]

## 2018-12-22 MED ORDER — SODIUM CHLORIDE 0.9 % IV SOLN
Freq: Once | INTRAVENOUS | Status: AC
  Administered 2018-12-22: 14:00:00 via INTRAVENOUS
  Filled 2018-12-22: qty 2

## 2018-12-22 MED ORDER — TOPOTECAN HCL CHEMO INJECTION 4 MG
1.2000 mg/m2 | Freq: Once | INTRAVENOUS | Status: AC
  Administered 2018-12-22: 2.2 mg via INTRAVENOUS
  Filled 2018-12-22: qty 2.2

## 2018-12-22 MED ORDER — ONDANSETRON HCL 4 MG/2ML IJ SOLN
INTRAMUSCULAR | Status: AC
  Filled 2018-12-22: qty 2

## 2018-12-22 NOTE — Patient Instructions (Signed)
[facility] Discharge Instructions for Patients Receiving Chemotherapy  Today you received the following chemotherapy agents   To help prevent nausea and vomiting after your treatment, we encourage you to take your nausea medication   If you develop nausea and vomiting that is not controlled by your nausea medication, call the clinic.   BELOW ARE SYMPTOMS THAT SHOULD BE REPORTED IMMEDIATELY:  *FEVER GREATER THAN 100.5 F  *CHILLS WITH OR WITHOUT FEVER  NAUSEA AND VOMITING THAT IS NOT CONTROLLED WITH YOUR NAUSEA MEDICATION  *UNUSUAL SHORTNESS OF BREATH  *UNUSUAL BRUISING OR BLEEDING  TENDERNESS IN MOUTH AND THROAT WITH OR WITHOUT PRESENCE OF ULCERS  *URINARY PROBLEMS  *BOWEL PROBLEMS  UNUSUAL RASH Items with * indicate a potential emergency and should be followed up as soon as possible.  Feel free to call the clinic should you have any questions or concerns. The clinic phone number is [phone].  Please show the CHEMO ALERT CARD at check-in to the Emergency Department and triage nurse.   

## 2018-12-22 NOTE — Progress Notes (Signed)
No new issues today per pt. Continue with treatment as planned.

## 2018-12-23 ENCOUNTER — Encounter (HOSPITAL_COMMUNITY): Payer: Self-pay

## 2018-12-23 ENCOUNTER — Inpatient Hospital Stay (HOSPITAL_COMMUNITY): Payer: Medicare PPO

## 2018-12-23 ENCOUNTER — Other Ambulatory Visit: Payer: Self-pay

## 2018-12-23 ENCOUNTER — Other Ambulatory Visit (HOSPITAL_COMMUNITY): Payer: Self-pay | Admitting: Hematology

## 2018-12-23 VITALS — BP 123/55 | HR 51 | Temp 98.2°F | Resp 18

## 2018-12-23 DIAGNOSIS — Z1589 Genetic susceptibility to other disease: Secondary | ICD-10-CM | POA: Diagnosis not present

## 2018-12-23 DIAGNOSIS — T451X5A Adverse effect of antineoplastic and immunosuppressive drugs, initial encounter: Secondary | ICD-10-CM | POA: Diagnosis not present

## 2018-12-23 DIAGNOSIS — D6481 Anemia due to antineoplastic chemotherapy: Secondary | ICD-10-CM | POA: Diagnosis not present

## 2018-12-23 DIAGNOSIS — Z5111 Encounter for antineoplastic chemotherapy: Secondary | ICD-10-CM | POA: Diagnosis not present

## 2018-12-23 DIAGNOSIS — C787 Secondary malignant neoplasm of liver and intrahepatic bile duct: Secondary | ICD-10-CM | POA: Diagnosis not present

## 2018-12-23 DIAGNOSIS — C3431 Malignant neoplasm of lower lobe, right bronchus or lung: Secondary | ICD-10-CM | POA: Diagnosis not present

## 2018-12-23 DIAGNOSIS — G893 Neoplasm related pain (acute) (chronic): Secondary | ICD-10-CM | POA: Diagnosis not present

## 2018-12-23 DIAGNOSIS — C3490 Malignant neoplasm of unspecified part of unspecified bronchus or lung: Secondary | ICD-10-CM

## 2018-12-23 DIAGNOSIS — G629 Polyneuropathy, unspecified: Secondary | ICD-10-CM | POA: Diagnosis not present

## 2018-12-23 DIAGNOSIS — C3432 Malignant neoplasm of lower lobe, left bronchus or lung: Secondary | ICD-10-CM | POA: Diagnosis not present

## 2018-12-23 MED ORDER — SODIUM CHLORIDE 0.9% FLUSH
10.0000 mL | INTRAVENOUS | Status: DC | PRN
  Administered 2018-12-23: 10 mL
  Filled 2018-12-23: qty 10

## 2018-12-23 MED ORDER — HEPARIN SOD (PORK) LOCK FLUSH 100 UNIT/ML IV SOLN
500.0000 [IU] | Freq: Once | INTRAVENOUS | Status: AC | PRN
  Administered 2018-12-23: 500 [IU]

## 2018-12-23 MED ORDER — SODIUM CHLORIDE 0.9 % IV SOLN
Freq: Once | INTRAVENOUS | Status: AC
  Administered 2018-12-23: 09:00:00 via INTRAVENOUS

## 2018-12-23 MED ORDER — SODIUM CHLORIDE 0.9 % IV SOLN
Freq: Once | INTRAVENOUS | Status: AC
  Administered 2018-12-23: 10:00:00 via INTRAVENOUS
  Filled 2018-12-23: qty 2

## 2018-12-23 MED ORDER — TOPOTECAN HCL CHEMO INJECTION 4 MG
1.2000 mg/m2 | Freq: Once | INTRAVENOUS | Status: AC
  Administered 2018-12-23: 2.2 mg via INTRAVENOUS
  Filled 2018-12-23: qty 2.2

## 2018-12-23 NOTE — Patient Instructions (Signed)
[facility] Discharge Instructions for Patients Receiving Chemotherapy  Today you received the following chemotherapy agents   To help prevent nausea and vomiting after your treatment, we encourage you to take your nausea medication    If you develop nausea and vomiting that is not controlled by your nausea medication, call the clinic.   BELOW ARE SYMPTOMS THAT SHOULD BE REPORTED IMMEDIATELY:  *FEVER GREATER THAN 100.5 F  *CHILLS WITH OR WITHOUT FEVER  NAUSEA AND VOMITING THAT IS NOT CONTROLLED WITH YOUR NAUSEA MEDICATION  *UNUSUAL SHORTNESS OF BREATH  *UNUSUAL BRUISING OR BLEEDING  TENDERNESS IN MOUTH AND THROAT WITH OR WITHOUT PRESENCE OF ULCERS  *URINARY PROBLEMS  *BOWEL PROBLEMS  UNUSUAL RASH Items with * indicate a potential emergency and should be followed up as soon as possible.  Feel free to call the clinic should you have any questions or concerns. The clinic phone number is [phone].  Please show the [place] at check-in to the Emergency Department and triage nurse.

## 2018-12-23 NOTE — Progress Notes (Signed)
Treatment given per orders. Patient tolerated it well without problems. Vitals stable and discharged home from clinic ambulatory. Follow up as scheduled.  

## 2018-12-24 ENCOUNTER — Encounter (HOSPITAL_COMMUNITY): Payer: Self-pay

## 2018-12-24 ENCOUNTER — Inpatient Hospital Stay (HOSPITAL_COMMUNITY): Payer: Medicare PPO

## 2018-12-24 VITALS — BP 138/51 | HR 50 | Temp 98.0°F | Resp 18 | Wt 150.2 lb

## 2018-12-24 DIAGNOSIS — C3490 Malignant neoplasm of unspecified part of unspecified bronchus or lung: Secondary | ICD-10-CM

## 2018-12-24 DIAGNOSIS — Z5111 Encounter for antineoplastic chemotherapy: Secondary | ICD-10-CM | POA: Diagnosis not present

## 2018-12-24 DIAGNOSIS — C3432 Malignant neoplasm of lower lobe, left bronchus or lung: Secondary | ICD-10-CM | POA: Diagnosis not present

## 2018-12-24 DIAGNOSIS — G893 Neoplasm related pain (acute) (chronic): Secondary | ICD-10-CM | POA: Diagnosis not present

## 2018-12-24 DIAGNOSIS — C787 Secondary malignant neoplasm of liver and intrahepatic bile duct: Secondary | ICD-10-CM | POA: Diagnosis not present

## 2018-12-24 DIAGNOSIS — Z1589 Genetic susceptibility to other disease: Secondary | ICD-10-CM | POA: Diagnosis not present

## 2018-12-24 DIAGNOSIS — G629 Polyneuropathy, unspecified: Secondary | ICD-10-CM | POA: Diagnosis not present

## 2018-12-24 DIAGNOSIS — D6481 Anemia due to antineoplastic chemotherapy: Secondary | ICD-10-CM | POA: Diagnosis not present

## 2018-12-24 DIAGNOSIS — C3431 Malignant neoplasm of lower lobe, right bronchus or lung: Secondary | ICD-10-CM | POA: Diagnosis not present

## 2018-12-24 DIAGNOSIS — T451X5A Adverse effect of antineoplastic and immunosuppressive drugs, initial encounter: Secondary | ICD-10-CM | POA: Diagnosis not present

## 2018-12-24 MED ORDER — HEPARIN SOD (PORK) LOCK FLUSH 100 UNIT/ML IV SOLN
500.0000 [IU] | Freq: Once | INTRAVENOUS | Status: AC | PRN
  Administered 2018-12-24: 500 [IU]

## 2018-12-24 MED ORDER — SODIUM CHLORIDE 0.9% FLUSH
10.0000 mL | INTRAVENOUS | Status: DC | PRN
  Administered 2018-12-24: 10 mL
  Filled 2018-12-24: qty 10

## 2018-12-24 MED ORDER — TOPOTECAN HCL CHEMO INJECTION 4 MG
1.2000 mg/m2 | Freq: Once | INTRAVENOUS | Status: AC
  Administered 2018-12-24: 2.2 mg via INTRAVENOUS
  Filled 2018-12-24: qty 2.2

## 2018-12-24 MED ORDER — SODIUM CHLORIDE 0.9 % IV SOLN
Freq: Once | INTRAVENOUS | Status: AC
  Administered 2018-12-24: 09:00:00 via INTRAVENOUS

## 2018-12-24 MED ORDER — PEGFILGRASTIM 6 MG/0.6ML ~~LOC~~ PSKT
6.0000 mg | PREFILLED_SYRINGE | Freq: Once | SUBCUTANEOUS | Status: AC
  Administered 2018-12-24: 6 mg via SUBCUTANEOUS
  Filled 2018-12-24: qty 0.6

## 2018-12-24 MED ORDER — SODIUM CHLORIDE 0.9 % IV SOLN
Freq: Once | INTRAVENOUS | Status: AC
  Administered 2018-12-24: 10:00:00 via INTRAVENOUS
  Filled 2018-12-24: qty 2

## 2018-12-24 NOTE — Progress Notes (Signed)
Patient tolerated chemotherapy with no complaints voiced.  Port site clean and dry with no bruising or swelling noted at site.  Good blood return noted before and after administration of chemotherapy.  Band aid applied. Neulasta Onpro applied with green indicator light flashing.   Patient left ambulatory with VSS and no s/s of distress noted.

## 2018-12-28 ENCOUNTER — Other Ambulatory Visit: Payer: Self-pay

## 2018-12-28 ENCOUNTER — Encounter: Payer: Self-pay | Admitting: Family Medicine

## 2018-12-28 ENCOUNTER — Ambulatory Visit (INDEPENDENT_AMBULATORY_CARE_PROVIDER_SITE_OTHER): Payer: Medicare PPO | Admitting: Family Medicine

## 2018-12-28 DIAGNOSIS — F5101 Primary insomnia: Secondary | ICD-10-CM

## 2018-12-28 DIAGNOSIS — F411 Generalized anxiety disorder: Secondary | ICD-10-CM

## 2018-12-28 MED ORDER — ALPRAZOLAM 1 MG PO TABS: 1.0000 mg | ORAL_TABLET | Freq: Four times a day (QID) | ORAL | 0 refills | Status: DC | PRN

## 2018-12-28 NOTE — Progress Notes (Signed)
Subjective:    Patient ID: [name], male    DOB: 06/09/1951, 68 y.o.   MRN: [id number]  HPI  Patient is here today for follow-up of his previous 2 office visits which were telephone visits.  He continues to suffer from insomnia.  He has an extensive past medical history.  At one point he was on 300 mg of trazodone at night to help him sleep however this was discontinued due to ineffectiveness.  He was at 1 point taking 2 mg of Xanax 4 times a day however given the use of pain medication I have recommended decreasing the dose to 1 mg.  He has been taking 1 mg 4 times a day up until recently when we decrease it to 3 times a day.  However he is unable to sleep at night.  We have tried the patient on hydroxyzine without benefit.  We have tried the patient on temazepam at night without benefit.  He is here today to discuss other options.  He also takes gabapentin at night as well as oxycodone for pain stemming from his lung cancer Past Medical History:  Diagnosis Date  . Anxiety   . CAD (coronary artery disease)   . Cancer ([place])    stage 4 small cell lung cancer   . COPD (chronic obstructive pulmonary disease) ([place])   . Depression   . Dyspnea    increased exertion  . Feeling of chest tightness   . Heart palpitations   . History of chemotherapy   . Myocardial infarction ([place])   . Osteopenia   . Panic attacks   . Smoker    Past Surgical History:  Procedure Laterality Date  . BACK SURGERY  12/24/2000   L5,S1  . CORONARY STENT PLACEMENT  2005   RCA & CX  . HERNIA REPAIR Right 1980's  . INGUINAL HERNIA REPAIR  12/1978   right side  . IR FLUORO GUIDE PORT INSERTION RIGHT  04/02/2017  . IR US GUIDE BX ASP/DRAIN  02/03/2017  . IR US GUIDE VASC ACCESS RIGHT  04/02/2017  . NM MYOCAR PERF WALL MOTION  09/07/2009   No ischemia; EF 51%  . SHOULDER SURGERY Left 08/2010  . SPINE SURGERY  2002   L5-S1   Current Outpatient Medications on File Prior to Visit  Medication Sig Dispense Refill   . aspirin EC 81 MG tablet Take 81 mg by mouth daily.    Marland Kitchen atorvastatin (LIPITOR) 40 MG tablet TAKE (1) TABLET BY MOUTH ONCE DAILY. 90 tablet 0  . clotrimazole-betamethasone (LOTRISONE) cream Apply 1 application topically 2 (two) times daily. 30 g 0  . cyclobenzaprine (FLEXERIL) 5 MG tablet TAKE 1 TABLET THREE TIMES DAILY AS NEEDED FOR MUSCLE SPASMS. 30 tablet 0  . gabapentin (NEURONTIN) 300 MG capsule TAKE (1) CAPSULE BY MOUTH TWICE DAILY. 60 capsule 0  . hydrOXYzine (ATARAX/VISTARIL) 50 MG tablet Take 1 tablet (50 mg total) by mouth at bedtime as needed (sleep). 30 tablet 0  . lidocaine-prilocaine (EMLA) cream Apply to affected area once 30 g 3  . ondansetron (ZOFRAN) 8 MG tablet Take 1 tablet (8 mg total) by mouth 2 (two) times daily as needed. Start on the third day after cisplatin chemotherapy. 30 tablet 1  . oxyCODONE-acetaminophen (PERCOCET/ROXICET) 5-325 MG tablet TAKE 1 TABLET TWICE DAILY AS NEEDED FOR SEVERE PAIN. 60 tablet 0  . Pegfilgrastim (NEULASTA ONPRO [place]) Inject into the skin. Every 21 days    . polyethylene glycol powder (GLYCOLAX/MIRALAX) powder Take  17 g by mouth daily. 3350 g 2  . prochlorperazine (COMPAZINE) 10 MG tablet Take 1 tablet (10 mg total) by mouth every 6 (six) hours as needed (Nausea or vomiting). 60 tablet 1  . TOPOTECAN HCL IV Inject into the vein.    Marland Kitchen topotecan in sodium chloride 0.9 % 100 mL Inject into the vein once. Days 1-5 every 21 days    . traMADol (ULTRAM) 50 MG tablet TAKE (1) TABLET BY MOUTH (3) TIMES DAILY. 30 tablet 0   No current facility-administered medications on file prior to visit.    Allergies  Allergen Reactions  . Codeine Nausea Only  . Niaspan Durene Cal Er]    Social History   Socioeconomic History  . Marital status: Single    Spouse name: Not on file  . Number of children: Not on file  . Years of education: Not on file  . Highest education level: Not on file  Occupational History  . Not on file  Social Needs  . Financial  resource strain: Not on file  . Food insecurity    Worry: Not on file    Inability: Not on file  . Transportation needs    Medical: Not on file    Non-medical: Not on file  Tobacco Use  . Smoking status: Current Every Day Smoker    Packs/day: 1.00    Types: Cigarettes  . Smokeless tobacco: Never Used  Substance and Sexual Activity  . Alcohol use: Yes    Comment: occas  . Drug use: No  . Sexual activity: Not on file  Lifestyle  . Physical activity    Days per week: Not on file    Minutes per session: Not on file  . Stress: Not on file  Relationships  . Social Herbalist on phone: Not on file    Gets together: Not on file    Attends religious service: Not on file    Active member of club or organization: Not on file    Attends meetings of clubs or organizations: Not on file    Relationship status: Not on file  . Intimate partner violence    Fear of current or ex partner: Not on file    Emotionally abused: Not on file    Physically abused: Not on file    Forced sexual activity: Not on file  Other Topics Concern  . Not on file  Social History Narrative  . Not on file     Review of Systems  All other systems reviewed and are negative.      Objective:   Physical Exam Vitals signs reviewed.  Constitutional:      Appearance: He is cachectic. He is not ill-appearing, toxic-appearing or diaphoretic.  Cardiovascular:     Rate and Rhythm: Normal rate and regular rhythm.     Heart sounds: Normal heart sounds.  Pulmonary:     Effort: Pulmonary effort is normal.     Breath sounds: Normal breath sounds.  Neurological:     General: No focal deficit present.     Mental Status: He is alert and oriented to person, place, and time. Mental status is at baseline.     Cranial Nerves: No cranial nerve deficit.  Psychiatric:        Mood and Affect: Mood normal.        Behavior: Behavior normal.        Thought Content: Thought content normal.        Judgment: Judgment  normal.           Assessment & Plan:  1. GAD (generalized anxiety disorder)  - ALPRAZolam (XANAX) 1 MG tablet; Take 1 tablet (1 mg total) by mouth 4 (four) times daily as needed. TAKE (1) TABLET BY MOUTH (4) TIMES DAILY.  Dispense: 120 tablet; Refill: 0  2. Primary insomnia - ALPRAZolam (XANAX) 1 MG tablet; Take 1 tablet (1 mg total) by mouth 4 (four) times daily as needed. TAKE (1) TABLET BY MOUTH (4) TIMES DAILY.  Dispense: 120 tablet; Refill: 0   We discussed the risk of habituation and dependency as well as the risk of accidental overdose due to polypharmacy.  He is taking gabapentin and oxycodone at night along with hydroxyzine.  He still unable to sleep.  He would like to try adding a dose of Xanax at night.  This would increase his total Xanax to 4 times a day plus hydroxyzine at night plus the gabapentin and oxycodone that he has been on for quite some time.  After discussing the risk, the patient elects to do this.  Reassess via telephone in 2 weeks

## 2018-12-29 ENCOUNTER — Other Ambulatory Visit: Payer: Self-pay | Admitting: Family Medicine

## 2018-12-29 ENCOUNTER — Other Ambulatory Visit: Payer: Medicare PPO

## 2018-12-29 ENCOUNTER — Other Ambulatory Visit: Payer: Self-pay | Admitting: Internal Medicine

## 2018-12-29 DIAGNOSIS — Z20822 Contact with and (suspected) exposure to covid-19: Secondary | ICD-10-CM

## 2018-12-29 DIAGNOSIS — R6889 Other general symptoms and signs: Secondary | ICD-10-CM | POA: Diagnosis not present

## 2019-01-01 LAB — NOVEL CORONAVIRUS, NAA: SARS-CoV-2, NAA: NOT DETECTED

## 2019-01-10 ENCOUNTER — Other Ambulatory Visit (HOSPITAL_COMMUNITY): Payer: Self-pay | Admitting: Nurse Practitioner

## 2019-01-10 DIAGNOSIS — C3490 Malignant neoplasm of unspecified part of unspecified bronchus or lung: Secondary | ICD-10-CM

## 2019-01-12 ENCOUNTER — Other Ambulatory Visit (HOSPITAL_COMMUNITY): Payer: Self-pay | Admitting: Nurse Practitioner

## 2019-01-12 DIAGNOSIS — T451X5A Adverse effect of antineoplastic and immunosuppressive drugs, initial encounter: Secondary | ICD-10-CM

## 2019-01-12 DIAGNOSIS — G620 Drug-induced polyneuropathy: Secondary | ICD-10-CM

## 2019-01-12 DIAGNOSIS — C3490 Malignant neoplasm of unspecified part of unspecified bronchus or lung: Secondary | ICD-10-CM

## 2019-01-17 ENCOUNTER — Inpatient Hospital Stay (HOSPITAL_COMMUNITY): Payer: Medicare PPO | Attending: Hematology

## 2019-01-17 ENCOUNTER — Inpatient Hospital Stay (HOSPITAL_COMMUNITY): Payer: Medicare PPO

## 2019-01-17 ENCOUNTER — Other Ambulatory Visit: Payer: Self-pay

## 2019-01-17 ENCOUNTER — Inpatient Hospital Stay (HOSPITAL_BASED_OUTPATIENT_CLINIC_OR_DEPARTMENT_OTHER): Payer: Medicare PPO | Admitting: Hematology

## 2019-01-17 ENCOUNTER — Encounter (HOSPITAL_COMMUNITY): Payer: Self-pay | Admitting: Hematology

## 2019-01-17 VITALS — BP 118/53 | HR 53 | Temp 97.5°F | Resp 18

## 2019-01-17 DIAGNOSIS — G629 Polyneuropathy, unspecified: Secondary | ICD-10-CM | POA: Insufficient documentation

## 2019-01-17 DIAGNOSIS — D6481 Anemia due to antineoplastic chemotherapy: Secondary | ICD-10-CM | POA: Insufficient documentation

## 2019-01-17 DIAGNOSIS — C3432 Malignant neoplasm of lower lobe, left bronchus or lung: Secondary | ICD-10-CM | POA: Diagnosis not present

## 2019-01-17 DIAGNOSIS — G893 Neoplasm related pain (acute) (chronic): Secondary | ICD-10-CM

## 2019-01-17 DIAGNOSIS — G620 Drug-induced polyneuropathy: Secondary | ICD-10-CM | POA: Diagnosis not present

## 2019-01-17 DIAGNOSIS — F17210 Nicotine dependence, cigarettes, uncomplicated: Secondary | ICD-10-CM | POA: Diagnosis not present

## 2019-01-17 DIAGNOSIS — I2510 Atherosclerotic heart disease of native coronary artery without angina pectoris: Secondary | ICD-10-CM | POA: Diagnosis not present

## 2019-01-17 DIAGNOSIS — C3490 Malignant neoplasm of unspecified part of unspecified bronchus or lung: Secondary | ICD-10-CM

## 2019-01-17 DIAGNOSIS — C3431 Malignant neoplasm of lower lobe, right bronchus or lung: Secondary | ICD-10-CM | POA: Insufficient documentation

## 2019-01-17 DIAGNOSIS — Z5111 Encounter for antineoplastic chemotherapy: Secondary | ICD-10-CM | POA: Diagnosis not present

## 2019-01-17 DIAGNOSIS — C787 Secondary malignant neoplasm of liver and intrahepatic bile duct: Secondary | ICD-10-CM

## 2019-01-17 DIAGNOSIS — J449 Chronic obstructive pulmonary disease, unspecified: Secondary | ICD-10-CM

## 2019-01-17 LAB — COMPREHENSIVE METABOLIC PANEL
ALT: 14 U/L (ref 0–44)
AST: 16 U/L (ref 15–41)
Albumin: 3.8 g/dL (ref 3.5–5.0)
Alkaline Phosphatase: 60 U/L (ref 38–126)
Anion gap: 6 (ref 5–15)
BUN: 21 mg/dL (ref 8–23)
CO2: 25 mmol/L (ref 22–32)
Calcium: 8.9 mg/dL (ref 8.9–10.3)
Chloride: 106 mmol/L (ref 98–111)
Creatinine, Ser: 0.86 mg/dL (ref 0.61–1.24)
GFR calc Af Amer: >60 mL/min (ref >60)
GFR calc non Af Amer: >60 mL/min (ref >60)
Glucose, Bld: 97 mg/dL (ref 70–99)
Potassium: 4 mmol/L (ref 3.5–5.1)
Sodium: 137 mmol/L (ref 135–145)
Total Bilirubin: 0.3 mg/dL (ref 0.3–1.2)
Total Protein: 6.5 g/dL (ref 6.5–8.1)

## 2019-01-17 LAB — CBC WITH DIFFERENTIAL/PLATELET
Abs Immature Granulocytes: 0.03 10*3/uL (ref 0.00–0.07)
Basophils Absolute: 0 10*3/uL (ref 0.0–0.1)
Basophils Relative: 1 %
Eosinophils Absolute: 0.2 10*3/uL (ref 0.0–0.5)
Eosinophils Relative: 2 %
HCT: 29 % — ABNORMAL LOW (ref 39.0–52.0)
Hemoglobin: 9.1 g/dL — ABNORMAL LOW (ref 13.0–17.0)
Immature Granulocytes: 0 %
Lymphocytes Relative: 17 %
Lymphs Abs: 1.4 10*3/uL (ref 0.7–4.0)
MCH: 30.7 pg (ref 26.0–34.0)
MCHC: 31.4 g/dL (ref 30.0–36.0)
MCV: 98 fL (ref 80.0–100.0)
Monocytes Absolute: 1 10*3/uL (ref 0.1–1.0)
Monocytes Relative: 13 %
Neutro Abs: 5.4 10*3/uL (ref 1.7–7.7)
Neutrophils Relative %: 67 %
Platelets: 389 10*3/uL (ref 150–400)
RBC: 2.96 MIL/uL — ABNORMAL LOW (ref 4.22–5.81)
RDW: 24 % — ABNORMAL HIGH (ref 11.5–15.5)
WBC: 8.1 10*3/uL (ref 4.0–10.5)
nRBC: 0 % (ref 0.0–0.2)

## 2019-01-17 MED ORDER — HEPARIN SOD (PORK) LOCK FLUSH 100 UNIT/ML IV SOLN
500.0000 [IU] | Freq: Once | INTRAVENOUS | Status: AC | PRN
  Administered 2019-01-17: 500 [IU]

## 2019-01-17 MED ORDER — OXYCODONE-ACETAMINOPHEN 5-325 MG PO TABS: 1.0000 | ORAL_TABLET | Freq: Three times a day (TID) | ORAL | 0 refills | Status: DC | PRN

## 2019-01-17 MED ORDER — SODIUM CHLORIDE 0.9 % IV SOLN
Freq: Once | INTRAVENOUS | Status: AC
  Administered 2019-01-17: 10:00:00 via INTRAVENOUS

## 2019-01-17 MED ORDER — SODIUM CHLORIDE 0.9% FLUSH
10.0000 mL | INTRAVENOUS | Status: DC | PRN
  Administered 2019-01-17 (×2): 10 mL
  Filled 2019-01-17 (×2): qty 10

## 2019-01-17 MED ORDER — SODIUM CHLORIDE 0.9 % IV SOLN
Freq: Once | INTRAVENOUS | Status: AC
  Administered 2019-01-17: 11:00:00 via INTRAVENOUS
  Filled 2019-01-17: qty 2

## 2019-01-17 MED ORDER — TOPOTECAN HCL CHEMO INJECTION 4 MG
1.2000 mg/m2 | Freq: Once | INTRAVENOUS | Status: AC
  Administered 2019-01-17: 2.2 mg via INTRAVENOUS
  Filled 2019-01-17: qty 2.2

## 2019-01-17 NOTE — Patient Instructions (Signed)
[facility] Discharge Instructions  You were seen today by Dr. Katragadda. He went over your recent lab results. He will see you back in  for labs and follow up.   Thank you for choosing  [facility] to provide your oncology and hematology care.  To afford each patient quality time with our provider, please arrive at least 15 minutes before your scheduled appointment time.   If you have a lab appointment with the Cancer Center please come in thru the  Main Entrance and check in at the main information desk  You need to re-schedule your appointment should you arrive 10 or more minutes late.  We strive to give you quality time with our providers, and arriving late affects you and other patients whose appointments are after yours.  Also, if you no show three or more times for appointments you may be dismissed from the clinic at the providers discretion.     Again, thank you for choosing [facility].  Our hope is that these requests will decrease the amount of time that you wait before being seen by our physicians.       _____________________________________________________________  Should you have questions after your visit to [facility], please contact our office at [phone] between the hours of 8:00 a.m. and 4:30 p.m.  Voicemails left after 4:00 p.m. will not be returned until the following business day.  For prescription refill requests, have your pharmacy contact our office and allow 72 hours.    Cancer Center Support Programs:   > Cancer Support Group  2nd Tuesday of the month 1pm-2pm, Journey Room    

## 2019-01-17 NOTE — Progress Notes (Signed)
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 Bulky 5.4 cm right supraclavicular region malignant lymph node conglomeration with extracapsular extension. 2. Surrounding smaller abnormal right level 3 and level 5 lymph nodes, and the lymphadenopathy continues into the superior mediastinum, see Chest CT findings reported separately. 3. No other metastatic disease identified in the neck.   01/16/2017 Imaging   CT chest: IMPRESSION: 1. Extensive lymphadenopathy in the thorax and lower right cervical region, as discussed above. Primary differential considerations include lymphoma/leukemia or small cell carcinoma of the lung. Further evaluation a PET-CT could be considered to assess for additional sites of disease below the diaphragm if clinically appropriate. Additionally, ultrasound-guided biopsy of supraclavicular lymphadenopathy could be considered to establish a tissue diagnosis. 2. Indeterminate lesion in the periphery of segment 8 of the liver measuring 2.7 x 1.7 cm. Attention at time of follow-up PET-CT is recommended. 3. Aortic atherosclerosis, in addition to left main and 3 vessel coronary artery disease. Please note that although the presence of coronary artery calcium documents the presence of coronary artery disease, the severity of this disease and any potential stenosis cannot be assessed on this non-gated CT examination. Assessment for potential risk factor modification, dietary therapy or pharmacologic therapy may be warranted, if clinically indicated. 4. There are calcifications of the aortic valve.  Echocardiographic correlation for evaluation of potential valvular dysfunction may be warranted if clinically indicated. 5. Diffuse bronchial wall thickening with moderate centrilobular and paraseptal emphysema; imaging findings suggestive of underlying COPD.   02/03/2017 Initial Biopsy   (R) neck lymph node biopsy: SMALL CELL CARCINOMA (most likely lung primary).    02/03/2017 Miscellaneous   Port-a-cath attempted by IR; unable to place d/t enlarged SVC.    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 Staging for metastatic disease  EXAM: MRI HEAD WITHOUT AND WITH CONTRAST  TECHNIQUE: Multiplanar, multiecho pulse sequences of the brain and surrounding structures were obtained without and with intravenous contrast.  CONTRAST:  38m MULTIHANCE GADOBENATE DIMEGLUMINE 529 MG/ML IV SOLN  COMPARISON:  None.  FINDINGS: Brain: Negative for hydrocephalus. Cerebral volume normal for age. Small nonenhancing white matter hyperintensities consistent with mild chronic microvascular ischemia. No acute infarct. Negative for hemorrhage or mass or edema  Normal enhancement postcontrast infusion. No enhancing mass lesion. Leptomeningeal enhancement is normal.  Vascular: Normal arterial flow voids.  Normal venous enhancement  Skull and upper cervical spine: Negative  Sinuses/Orbits: Negative  Other: None  IMPRESSION: Negative for metastatic disease.  No acute abnormality.  Mild chronic white matter changes.   04/07/2017 Imaging    PET:  1. Marked reduction in size and metabolic activity of bulky RIGHT supraclavicular adenopathy mediastinal lymphadenopathy. 2. Residual moderate activity remains within small RIGHT supraclavicular lymph node, RIGHT lower paratracheal  lymph node and RIGHT hilar lymph node. 3. Resolution of prevascular and internal mammary mediastinal metastatic hypermetabolic activity. 4. Resolution of metabolic activity associated with solitary RIGHT  hepatic lobe liver metastasis. 5. No evidence of disease progression. 6. 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   05/27/2017 Imaging   MRI brain w/ and w/o contrast IMPRESSION: 1. No metastatic disease identified. 2. Increased nonspecific cerebral white matter signal changes since August. These are most commonly small vessel disease related. 3. New right maxillary sinusitis. Benign appearing retention cysts in the nasopharynx with trace mastoid effusions.   06/12/2017 Imaging   PET-CT IMPRESSION: 1. There are two new hypermetabolic nodules identified within both lower lobes measuring up to 3.1 cm. The appearance is nonspecific and may be inflammatory/infectious in etiology. Pulmonary metastatic disease cannot be excluded and short-term follow-up imaging in 3 months is advised to reassess these nodules. 2. Stable appearance of mild hypermetabolic activity associated with right paratracheal and right hilar lymph nodes. 3. Decrease in FDG uptake associated with index right supraclavicular lymph node. 4. 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 5. Aortic Atherosclerosis (ICD10-I70.0) and Emphysema (ICD10-J43.9).   08/24/2017 -  Chemotherapy   The patient had pegfilgrastim (NEULASTA ONPRO KIT) injection 6 mg, 6 mg, Subcutaneous, Once, 19 of 20 cycles Administration: 6 mg (08/28/2017), 6 mg (09/18/2017), 6 mg (10/09/2017), 6 mg (11/02/2017), 6 mg (11/20/2017), 6 mg (12/18/2017), 6 mg (01/15/2018), 6 mg (02/12/2018), 6 mg (03/12/2018), 6 mg (04/09/2018), 6 mg (05/07/2018), 6 mg (06/04/2018), 6 mg  (07/02/2018), 6 mg (07/30/2018), 6 mg (08/27/2018), 6 mg (09/24/2018), 6 mg (10/29/2018), 6 mg (11/26/2018), 6 mg (12/24/2018) topotecan (HYCAMTIN) 2.9 mg in sodium chloride 0.9 % 100 mL chemo infusion, 1.5 mg/m2 = 2.9 mg, Intravenous,  Once, 19 of 20 cycles Dose modification: 1.2 mg/m2 (80 % of original dose 1.5 mg/m2, Cycle 4, Reason: Dose Not Tolerated) Administration: 2.9 mg (08/24/2017), 2.9 mg (08/25/2017), 2.9 mg (08/28/2017), 2.9 mg (08/26/2017), 2.9 mg (08/27/2017), 2.9 mg (09/14/2017), 2.9 mg (09/15/2017), 2.9 mg (09/16/2017), 2.9 mg (09/17/2017), 2.9 mg (09/18/2017), 2.9 mg (10/05/2017), 2.9 mg (10/06/2017), 2.9 mg (10/07/2017), 2.9 mg (10/08/2017), 2.9 mg (10/09/2017), 2.3 mg (10/26/2017), 2.3 mg (10/27/2017), 2.3 mg (10/28/2017), 2.3 mg (10/29/2017), 2.3 mg (11/02/2017), 2.3 mg (11/16/2017), 2.3 mg (11/17/2017), 2.3 mg (11/18/2017), 2.3 mg (11/19/2017), 2.3 mg (11/20/2017), 2.3 mg (12/14/2017), 2.3 mg (12/15/2017), 2.3 mg (12/16/2017), 2.3 mg (12/17/2017), 2.3 mg (12/18/2017), 2.3 mg (01/11/2018), 2.3 mg (01/12/2018), 2.3 mg (01/13/2018), 2.3 mg (01/15/2018), 2.3 mg (02/08/2018), 2.3 mg (02/09/2018), 2.3 mg (02/10/2018), 2.3 mg (02/11/2018), 2.3 mg (02/12/2018), 2.3 mg (03/08/2018), 2.3 mg (03/09/2018), 2.3 mg (03/10/2018), 2.3 mg (03/11/2018), 2.3 mg (03/12/2018), 2.2 mg (04/05/2018), 2.2 mg (04/06/2018), 2.2 mg (04/07/2018), 2.2 mg (04/08/2018), 2.2 mg (04/09/2018), 2.2 mg (05/03/2018), 2.2 mg (05/04/2018), 2.2 mg (05/05/2018), 2.2 mg (05/06/2018), 2.2 mg (05/07/2018), 2.2 mg (05/31/2018), 2.2 mg (06/01/2018), 2.2 mg (06/02/2018), 2.2 mg (06/03/2018), 2.2 mg (06/04/2018), 2.2 mg (06/28/2018), 2.2 mg (06/29/2018), 2.2 mg (06/30/2018), 2.2 mg (07/01/2018), 2.2 mg (07/02/2018), 2.2 mg (07/26/2018), 2.2 mg (07/27/2018), 2.2 mg (07/28/2018), 2.2 mg (07/29/2018), 2.2 mg (07/30/2018), 2.2 mg (08/23/2018), 2.2 mg (08/24/2018), 2.2 mg (08/25/2018), 2.2 mg (08/26/2018), 2.2 mg (08/27/2018), 2.2 mg (09/20/2018), 2.2 mg (09/21/2018), 2.2 mg (09/22/2018), 2.2 mg (09/23/2018), 2.2 mg (09/24/2018), 2.2  mg (10/25/2018), 2.2 mg (10/26/2018), 2.2 mg (10/27/2018), 2.2 mg (10/28/2018), 2.2 mg (10/29/2018), 2.2 mg (11/22/2018), 2.2 mg (11/23/2018), 2.2 mg (11/24/2018), 2.2 mg (11/25/2018), 2.2 mg (11/26/2018), 2.2 mg (12/20/2018), 2.2 mg (12/21/2018), 2.2 mg (12/22/2018), 2.2 mg (12/23/2018),  2.2 mg (12/24/2018) ondansetron (ZOFRAN) 4 mg in sodium chloride 0.9 % 50 mL IVPB, , Intravenous,  Once, 14 of 15 cycles Administration:  (02/08/2018),  (02/09/2018),  (02/10/2018),  (02/11/2018),  (02/12/2018),  (03/08/2018),  (03/09/2018),  (03/10/2018),  (03/11/2018),  (03/12/2018),  (04/05/2018),  (04/06/2018),  (04/07/2018),  (04/08/2018),  (04/09/2018),  (05/03/2018),  (05/04/2018),  (05/05/2018),  (05/06/2018),  (05/07/2018),  (05/31/2018),  (06/01/2018),  (06/02/2018),  (06/03/2018),  (06/04/2018),  (06/28/2018),  (06/29/2018),  (06/30/2018),  (07/01/2018),  (07/02/2018),  (07/26/2018),  (07/27/2018),  (07/28/2018),  (07/29/2018),  (07/30/2018),  (08/23/2018),  (08/24/2018),  (08/25/2018),  (08/26/2018),  (08/27/2018),  (09/20/2018),  (09/21/2018),  (09/22/2018),  (09/23/2018),  (09/24/2018),  (10/25/2018),  (10/26/2018),  (10/27/2018),  (10/28/2018),  (10/29/2018),  (11/22/2018),  (11/23/2018),  (11/24/2018),  (11/25/2018),  (11/26/2018),  (12/20/2018),  (12/21/2018),  (12/22/2018),  (12/23/2018),  (12/24/2018)  for chemotherapy treatment.         INTERVAL HISTORY:  [name] 68 y.o. male seen for next cycle of chemotherapy.  Cycle 19 of chemotherapy was on 12/20/2018.  He reports pain is not well controlled with Percocet 2 tablets daily.  Numbness in the feet has been stable.  Appetite is 100%.  Energy levels are 75%.  Pain in the back is reported at 5.  It comes down to 3 after taking pain medicine.  Denies any ER visits or hospitalizations.  Denies any nausea vomiting or diarrhea.  Denies any bleeding per rectum or melena.     REVIEW OF SYSTEMS:  Review of Systems  Respiratory: Negative for cough.   Musculoskeletal: Positive for back pain.  Neurological: Positive for  numbness.  Psychiatric/Behavioral: Negative for sleep disturbance.  All other systems reviewed and are negative.    PAST MEDICAL/SURGICAL HISTORY:  Past Medical History:  Diagnosis Date   Anxiety    CAD (coronary artery disease)    Cancer (HCC)    stage 4 small cell lung cancer    COPD (chronic obstructive pulmonary disease) (HCC)    Depression    Dyspnea    increased exertion   Feeling of chest tightness    Heart palpitations    History of chemotherapy    Myocardial infarction ([place])    Osteopenia    Panic attacks    Smoker    Past Surgical History:  Procedure Laterality Date   BACK SURGERY  12/24/2000   L5,S1   CORONARY STENT PLACEMENT  2005   RCA & CX   HERNIA REPAIR Right 1980's   INGUINAL HERNIA REPAIR  12/1978   right side   IR FLUORO GUIDE PORT INSERTION RIGHT  04/02/2017   IR US GUIDE BX ASP/DRAIN  02/03/2017   IR US GUIDE VASC ACCESS RIGHT  04/02/2017   NM MYOCAR PERF WALL MOTION  09/07/2009   No ischemia; EF 51%   SHOULDER SURGERY Left 08/2010   SPINE SURGERY  2002   L5-S1     SOCIAL HISTORY:  Social History   Socioeconomic History   Marital status: Single    Spouse name: Not on file   Number of children: Not on file   Years of education: Not on file   Highest education level: Not on file  Occupational History   Not on file  Social Needs   Financial resource strain: Not on file   Food insecurity    Worry: Not on file    Inability: Not on file   Transportation needs    Medical: Not on file    Non-medical: Not on file  Tobacco Use   Smoking status: Current Every Day Smoker    Packs/day: 1.00    Types: Cigarettes   Smokeless tobacco: Never Used  Substance and Sexual Activity   Alcohol use: Yes    Comment: occas   Drug use: No   Sexual activity: Not on file  Lifestyle   Physical activity    Days per week: Not on file    Minutes per session: Not on file   Stress: Not on file  Relationships   Social  connections    Talks on phone: Not on file    Gets together: Not on file    Attends religious service: Not on file    Active member of club or organization: Not on file    Attends meetings of clubs or organizations: Not on file    Relationship status: Not on file   Intimate partner violence    Fear of current or ex partner: Not on file    Emotionally abused: Not on file    Physically abused: Not on file    Forced sexual activity: Not on file  Other Topics Concern   Not on file  Social History Narrative   Not on file    FAMILY HISTORY:  Family History  Problem Relation Age of Onset   Heart attack Father    Kidney disease Father        renal failure   Heart failure Mother    Heart attack Mother    Cancer Brother    Diabetes Brother    Alcohol abuse Brother    Diabetes Sister     CURRENT MEDICATIONS:  Outpatient Encounter Medications as of 01/17/2019  Medication Sig   ALPRAZolam (XANAX) 1 MG tablet Take 1 tablet (1 mg total) by mouth 4 (four) times daily as needed. TAKE (1) TABLET BY MOUTH (4) TIMES DAILY.   aspirin EC 81 MG tablet Take 81 mg by mouth daily.   atorvastatin (LIPITOR) 40 MG tablet TAKE (1) TABLET BY MOUTH ONCE DAILY.   clotrimazole-betamethasone (LOTRISONE) cream Apply 1 application topically 2 (two) times daily.   cyclobenzaprine (FLEXERIL) 5 MG tablet TAKE 1 TABLET THREE TIMES DAILY AS NEEDED FOR MUSCLE SPASMS.   gabapentin (NEURONTIN) 300 MG capsule TAKE (1) CAPSULE BY MOUTH TWICE DAILY.   hydrOXYzine (ATARAX/VISTARIL) 50 MG tablet TAKE ONE TABLET BY MOUTH AT BEDTIME AS NEEDED FOR SLEEP.   lidocaine-prilocaine (EMLA) cream Apply to affected area once   ondansetron (ZOFRAN) 8 MG tablet Take 1 tablet (8 mg total) by mouth 2 (two) times daily as needed. Start on the third day after cisplatin chemotherapy.   oxyCODONE-acetaminophen (PERCOCET/ROXICET) 5-325 MG tablet TAKE 1 TABLET TWICE DAILY AS NEEDED FOR SEVERE PAIN.   Pegfilgrastim  (NEULASTA ONPRO [place]) Inject into the skin. Every 21 days   polyethylene glycol powder (GLYCOLAX/MIRALAX) powder Take 17 g by mouth daily.   prochlorperazine (COMPAZINE) 10 MG tablet Take 1 tablet (10 mg total) by mouth every 6 (six) hours as needed (Nausea or vomiting).   TOPOTECAN HCL IV Inject into the vein.   topotecan in sodium chloride 0.9 % 100 mL Inject into the vein once. Days 1-5 every 21 days   traMADol (ULTRAM) 50 MG tablet TAKE (1) TABLET BY MOUTH (3) TIMES DAILY.   No facility-administered encounter medications on file as of 01/17/2019.     ALLERGIES:  Allergies  Allergen Reactions   Codeine Nausea Only   Niaspan [Niacin Er]      PHYSICAL EXAM:  ECOG  Performance status: 1  Vitals:   01/17/19 0906  BP: (!) 117/54  Pulse: 60  Resp: 18  Temp: (!) 97.3 F (36.3 C)  SpO2: 100%   Filed Weights   01/17/19 0906  Weight: 152 lb (68.9 kg)    Physical Exam Vitals signs reviewed.  Constitutional:      Appearance: Normal appearance.  Cardiovascular:     Rate and Rhythm: Normal rate and regular rhythm.     Heart sounds: Normal heart sounds.  Pulmonary:     Effort: Pulmonary effort is normal.     Breath sounds: Normal breath sounds.  Abdominal:     General: There is no distension.     Palpations: Abdomen is soft. There is no mass.  Musculoskeletal:        General: No swelling.  Skin:    General: Skin is warm.  Neurological:     General: No focal deficit present.     Mental Status: He is alert and oriented to person, place, and time.  Psychiatric:        Mood and Affect: Mood normal.        Behavior: Behavior normal.      LABORATORY DATA:  I have reviewed the labs as listed.  CBC    Component Value Date/Time   WBC 8.1 01/17/2019 0903   RBC 2.96 (L) 01/17/2019 0903   HGB 9.1 (L) 01/17/2019 0903   HCT 29.0 (L) 01/17/2019 0903   PLT 389 01/17/2019 0903   MCV 98.0 01/17/2019 0903   MCH 30.7 01/17/2019 0903   MCHC 31.4 01/17/2019 0903   RDW 24.0  (H) 01/17/2019 0903   LYMPHSABS PENDING 01/17/2019 0903   MONOABS PENDING 01/17/2019 0903   EOSABS PENDING 01/17/2019 0903   BASOSABS PENDING 01/17/2019 0903   CMP Latest Ref Rng & Units 01/17/2019 12/20/2018 11/22/2018  Glucose 70 - 99 mg/dL 97 100(H) 89  BUN 8 - 23 mg/dL _0 Creatinine 0.61 - 1.24 mg/dL 0.86 0.77 0.71  Sodium 135 - 145 mmol/L 137 137 139  Potassium 3.5 - 5.1 mmol/L 4.0 4.0 3.9  Chloride 98 - 111 mmol/L 106 104 107  CO2 22 - 32 mmol/L _1 Calcium 8.9 - 10.3 mg/dL 8.9 8.9 8.7(L)  Total Protein 6.5 - 8.1 g/dL 6.5 6.7 6.4(L)  Total Bilirubin 0.3 - 1.2 mg/dL 0.3 0.3 0.5  Alkaline Phos 38 - 126 U/L 60 55 57  AST 15 - 41 U/L 16 14(L) 13(L)  ALT 0 - 44 U/L _2 ASSESSMENT & PLAN:   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  - PET/CT scan on 08/13/2017 showing progression with bilateral lung nodules and mediastinal adenopathy -Topotecan (1.5 mg/m square) for 5 days every 21 days started on 08/24/2017.  -He gets brain MRI done in [place] periodically.  Last scan was negative for metastatic disease.  -Topotecan Dose reduced to 1.2 mg/m during cycle 4.  It was also increased to every 4 weeks for better tolerability. -Last Topotecan was on 11/22/2018. -CT CAP on 11/18/2018 showed dominant right lower lobe pulmonary nodule stable measuring 1.7 x 0.8 cm.  Left lower lobe pulmonary nodule measures 1.6 x 0.8 cm.  Overall stable disease with no new progressive findings. - He is tolerating treatment well.  Blood work is stable.  He will proceed with  his next cycle of chemotherapy today.  - Return to clinic in 4 weeks.  2.  Normocytic anemia: -This is chemotherapy-induced.  Last Feraheme infusion was on 10/19/2017. -Hemoglobin is between 8-9 and stable.  3.  Peripheral neuropathy: -he will continue gabapentin 300 mg twice daily.  4.  Cancer  related pain: -Taking Percocet 2 times a day.  Total time spent is 25 minutes with more than 50% of the time spent face-to-face discussing treatment plan and coordination of care.    Orders placed this encounter:  No orders of the defined types were placed in this encounter.     Derek Jack, MD [place] [phone]

## 2019-01-17 NOTE — Assessment & Plan Note (Addendum)
1.  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  - PET/CT scan on 08/13/2017 showing progression with bilateral lung nodules and mediastinal adenopathy -Topotecan (1.5 mg/m square) for 5 days every 21 days started on 08/24/2017.  -He gets brain MRI done in [place] periodically.  Last scan was negative for metastatic disease.  -Topotecan Dose reduced to 1.2 mg/m during cycle 4.  It was also increased to every 4 weeks for better tolerability. -CT CAP on 11/18/2018 showed dominant right lower lobe pulmonary nodule stable measuring 1.7 x 0.8 cm.  Left lower lobe pulmonary nodule measures 1.6 x 0.8 cm.  Overall stable disease with no new progressive findings. - Cycle 19 of chemotherapy was on 12/20/2018. - He is tolerating chemotherapy very well.  We reviewed his labs.  He may proceed with next cycle today. - I plan to repeat scans in 4 months from the last scan.  2.  Normocytic anemia: -This is chemotherapy-induced.  Last Feraheme infusion was on 10/19/2017. -Hemoglobin is between 8-9 and stable.  3.  Peripheral neuropathy: -he will continue gabapentin 300 mg twice daily.  4.  NSAID related pain: -Taking Percocet 2 times a day. -Pain is not completely controlled.  We will increase Percocet 5 mg to 3 times a day.

## 2019-01-17 NOTE — Progress Notes (Signed)
Patient seen by Dr. Delton Coombes with lab review and ok to treat today.   Patient tolerated chemotherapy with no complaints voiced.  Port site clean and dry with no bruising or swelling noted at site.  Good blood return noted before and after administration of chemotherapy.  Dressing intact.   Patient left ambulatory with VSS and no s/s of distress noted.

## 2019-01-18 ENCOUNTER — Inpatient Hospital Stay (HOSPITAL_COMMUNITY): Payer: Medicare PPO

## 2019-01-18 ENCOUNTER — Other Ambulatory Visit (HOSPITAL_COMMUNITY): Payer: Self-pay | Admitting: *Deleted

## 2019-01-18 VITALS — BP 127/47 | HR 55 | Temp 96.9°F | Resp 18

## 2019-01-18 DIAGNOSIS — Z5111 Encounter for antineoplastic chemotherapy: Secondary | ICD-10-CM | POA: Diagnosis not present

## 2019-01-18 DIAGNOSIS — G893 Neoplasm related pain (acute) (chronic): Secondary | ICD-10-CM | POA: Diagnosis not present

## 2019-01-18 DIAGNOSIS — C787 Secondary malignant neoplasm of liver and intrahepatic bile duct: Secondary | ICD-10-CM | POA: Diagnosis not present

## 2019-01-18 DIAGNOSIS — C3431 Malignant neoplasm of lower lobe, right bronchus or lung: Secondary | ICD-10-CM | POA: Diagnosis not present

## 2019-01-18 DIAGNOSIS — C3432 Malignant neoplasm of lower lobe, left bronchus or lung: Secondary | ICD-10-CM | POA: Diagnosis not present

## 2019-01-18 DIAGNOSIS — D6481 Anemia due to antineoplastic chemotherapy: Secondary | ICD-10-CM | POA: Diagnosis not present

## 2019-01-18 DIAGNOSIS — C3490 Malignant neoplasm of unspecified part of unspecified bronchus or lung: Secondary | ICD-10-CM

## 2019-01-18 DIAGNOSIS — G629 Polyneuropathy, unspecified: Secondary | ICD-10-CM | POA: Diagnosis not present

## 2019-01-18 MED ORDER — OXYCODONE-ACETAMINOPHEN 5-325 MG PO TABS: 1.0000 | ORAL_TABLET | Freq: Three times a day (TID) | ORAL | 0 refills | Status: DC | PRN

## 2019-01-18 MED ORDER — SODIUM CHLORIDE 0.9% FLUSH
10.0000 mL | INTRAVENOUS | Status: DC | PRN
  Administered 2019-01-18: 10 mL
  Filled 2019-01-18: qty 10

## 2019-01-18 MED ORDER — HEPARIN SOD (PORK) LOCK FLUSH 100 UNIT/ML IV SOLN
500.0000 [IU] | Freq: Once | INTRAVENOUS | Status: AC
  Administered 2019-01-18: 500 [IU] via INTRAVENOUS

## 2019-01-18 MED ORDER — SODIUM CHLORIDE 0.9 % IV SOLN
Freq: Once | INTRAVENOUS | Status: AC
  Administered 2019-01-18: 14:00:00 via INTRAVENOUS
  Filled 2019-01-18: qty 2

## 2019-01-18 MED ORDER — TOPOTECAN HCL CHEMO INJECTION 4 MG
1.2000 mg/m2 | Freq: Once | INTRAVENOUS | Status: AC
  Administered 2019-01-18: 2.2 mg via INTRAVENOUS
  Filled 2019-01-18: qty 2.2

## 2019-01-18 MED ORDER — SODIUM CHLORIDE 0.9 % IV SOLN
Freq: Once | INTRAVENOUS | Status: AC
  Administered 2019-01-18: 13:00:00 via INTRAVENOUS

## 2019-01-18 NOTE — Progress Notes (Signed)
Harvie Junior tolerated Topotecan without incident or complaint. VSS. Discharged self ambulatory in satisfactory condition.

## 2019-01-18 NOTE — Patient Instructions (Signed)
[facility] _______________________________________________________________  Thank you for choosing [facility] to provide your oncology and hematology care.  To afford each patient quality time with our providers, please arrive at least 15 minutes before your scheduled appointment.  You need to re-schedule your appointment if you arrive 10 or more minutes late.  We strive to give you quality time with our providers, and arriving late affects you and other patients whose appointments are after yours.  Also, if you no show three or more times for appointments you may be dismissed from the clinic.  Again, thank you for choosing [facility]. Our hope is that these requests will allow you access to exceptional care and in a timely manner. _______________________________________________________________  If you have questions after your visit, please contact our office at [phone] between the hours of 8:30 a.m. and 5:00 p.m. Voicemails left after 4:30 p.m. will not be returned until the following business day. _______________________________________________________________  For prescription refill requests, have your pharmacy contact our office. _______________________________________________________________  Recommendations made by the consultant and any test results will be sent to your referring physician. _______________________________________________________________ 

## 2019-01-19 ENCOUNTER — Inpatient Hospital Stay (HOSPITAL_COMMUNITY): Payer: Medicare PPO

## 2019-01-19 ENCOUNTER — Other Ambulatory Visit: Payer: Self-pay

## 2019-01-19 VITALS — BP 122/48 | HR 53 | Temp 97.8°F | Resp 18

## 2019-01-19 DIAGNOSIS — C787 Secondary malignant neoplasm of liver and intrahepatic bile duct: Secondary | ICD-10-CM | POA: Diagnosis not present

## 2019-01-19 DIAGNOSIS — Z5111 Encounter for antineoplastic chemotherapy: Secondary | ICD-10-CM | POA: Diagnosis not present

## 2019-01-19 DIAGNOSIS — G629 Polyneuropathy, unspecified: Secondary | ICD-10-CM | POA: Diagnosis not present

## 2019-01-19 DIAGNOSIS — G893 Neoplasm related pain (acute) (chronic): Secondary | ICD-10-CM | POA: Diagnosis not present

## 2019-01-19 DIAGNOSIS — C3490 Malignant neoplasm of unspecified part of unspecified bronchus or lung: Secondary | ICD-10-CM

## 2019-01-19 DIAGNOSIS — D6481 Anemia due to antineoplastic chemotherapy: Secondary | ICD-10-CM | POA: Diagnosis not present

## 2019-01-19 DIAGNOSIS — C3432 Malignant neoplasm of lower lobe, left bronchus or lung: Secondary | ICD-10-CM | POA: Diagnosis not present

## 2019-01-19 DIAGNOSIS — C3431 Malignant neoplasm of lower lobe, right bronchus or lung: Secondary | ICD-10-CM | POA: Diagnosis not present

## 2019-01-19 MED ORDER — SODIUM CHLORIDE 0.9% FLUSH
10.0000 mL | INTRAVENOUS | Status: DC | PRN
  Administered 2019-01-19 (×2): 10 mL
  Filled 2019-01-19 (×2): qty 10

## 2019-01-19 MED ORDER — HEPARIN SOD (PORK) LOCK FLUSH 100 UNIT/ML IV SOLN
500.0000 [IU] | Freq: Once | INTRAVENOUS | Status: AC | PRN
  Administered 2019-01-19: 500 [IU]

## 2019-01-19 MED ORDER — TOPOTECAN HCL CHEMO INJECTION 4 MG
1.2000 mg/m2 | Freq: Once | INTRAVENOUS | Status: AC
  Administered 2019-01-19: 2.2 mg via INTRAVENOUS
  Filled 2019-01-19: qty 2.2

## 2019-01-19 MED ORDER — SODIUM CHLORIDE 0.9 % IV SOLN
Freq: Once | INTRAVENOUS | Status: AC
  Administered 2019-01-19: 14:00:00 via INTRAVENOUS
  Filled 2019-01-19: qty 2

## 2019-01-19 MED ORDER — SODIUM CHLORIDE 0.9 % IV SOLN
Freq: Once | INTRAVENOUS | Status: AC
  Administered 2019-01-19: 13:00:00 via INTRAVENOUS

## 2019-01-19 NOTE — Patient Instructions (Signed)
[facility] Discharge Instructions for Patients Receiving Chemotherapy  Today you received the following chemotherapy agents   To help prevent nausea and vomiting after your treatment, we encourage you to take your nausea medication   If you develop nausea and vomiting that is not controlled by your nausea medication, call the clinic.   BELOW ARE SYMPTOMS THAT SHOULD BE REPORTED IMMEDIATELY:  *FEVER GREATER THAN 100.5 F  *CHILLS WITH OR WITHOUT FEVER  NAUSEA AND VOMITING THAT IS NOT CONTROLLED WITH YOUR NAUSEA MEDICATION  *UNUSUAL SHORTNESS OF BREATH  *UNUSUAL BRUISING OR BLEEDING  TENDERNESS IN MOUTH AND THROAT WITH OR WITHOUT PRESENCE OF ULCERS  *URINARY PROBLEMS  *BOWEL PROBLEMS  UNUSUAL RASH Items with * indicate a potential emergency and should be followed up as soon as possible.  Feel free to call the clinic should you have any questions or concerns. The clinic phone number is [phone].  Please show the CHEMO ALERT CARD at check-in to the Emergency Department and triage nurse.   

## 2019-01-19 NOTE — Progress Notes (Signed)
No new issues today per patient. Proceed today with treatment.   Treatment given per orders. Patient tolerated it well without problems. Vitals stable and discharged home from clinic ambulatory. Follow up as scheduled.

## 2019-01-20 ENCOUNTER — Inpatient Hospital Stay (HOSPITAL_COMMUNITY): Payer: Medicare PPO

## 2019-01-20 ENCOUNTER — Encounter (HOSPITAL_COMMUNITY): Payer: Self-pay

## 2019-01-20 VITALS — BP 129/51 | HR 52 | Temp 97.1°F | Resp 18

## 2019-01-20 DIAGNOSIS — G893 Neoplasm related pain (acute) (chronic): Secondary | ICD-10-CM | POA: Diagnosis not present

## 2019-01-20 DIAGNOSIS — C3432 Malignant neoplasm of lower lobe, left bronchus or lung: Secondary | ICD-10-CM | POA: Diagnosis not present

## 2019-01-20 DIAGNOSIS — C787 Secondary malignant neoplasm of liver and intrahepatic bile duct: Secondary | ICD-10-CM | POA: Diagnosis not present

## 2019-01-20 DIAGNOSIS — C3490 Malignant neoplasm of unspecified part of unspecified bronchus or lung: Secondary | ICD-10-CM

## 2019-01-20 DIAGNOSIS — C3431 Malignant neoplasm of lower lobe, right bronchus or lung: Secondary | ICD-10-CM | POA: Diagnosis not present

## 2019-01-20 DIAGNOSIS — Z5111 Encounter for antineoplastic chemotherapy: Secondary | ICD-10-CM | POA: Diagnosis not present

## 2019-01-20 DIAGNOSIS — D6481 Anemia due to antineoplastic chemotherapy: Secondary | ICD-10-CM | POA: Diagnosis not present

## 2019-01-20 DIAGNOSIS — G629 Polyneuropathy, unspecified: Secondary | ICD-10-CM | POA: Diagnosis not present

## 2019-01-20 MED ORDER — SODIUM CHLORIDE 0.9% FLUSH
10.0000 mL | INTRAVENOUS | Status: DC | PRN
  Administered 2019-01-20: 10 mL
  Filled 2019-01-20: qty 10

## 2019-01-20 MED ORDER — SODIUM CHLORIDE 0.9 % IV SOLN
Freq: Once | INTRAVENOUS | Status: AC
  Administered 2019-01-20: 14:00:00 via INTRAVENOUS
  Filled 2019-01-20: qty 2

## 2019-01-20 MED ORDER — SODIUM CHLORIDE 0.9 % IV SOLN
Freq: Once | INTRAVENOUS | Status: AC
  Administered 2019-01-20: 13:00:00 via INTRAVENOUS

## 2019-01-20 MED ORDER — HEPARIN SOD (PORK) LOCK FLUSH 100 UNIT/ML IV SOLN
500.0000 [IU] | Freq: Once | INTRAVENOUS | Status: AC | PRN
  Administered 2019-01-20: 500 [IU]

## 2019-01-20 MED ORDER — TOPOTECAN HCL CHEMO INJECTION 4 MG
1.2000 mg/m2 | Freq: Once | INTRAVENOUS | Status: AC
  Administered 2019-01-20: 2.2 mg via INTRAVENOUS
  Filled 2019-01-20: qty 2.2

## 2019-01-20 NOTE — Patient Instructions (Signed)
[facility] Discharge Instructions for Patients Receiving Chemotherapy   Beginning January 23rd 2017 lab work for the [facility] will be done in the  Main lab at [facility] on 1st floor. If you have a lab appointment with the [facility] please come in thru the  Main Entrance and check in at the main information desk   Today you received the following chemotherapy agents Topotecan. Follow-up as scheduled. Call clinic for any questions or concerns  To help prevent nausea and vomiting after your treatment, we encourage you to take your nausea medication   If you develop nausea and vomiting, or diarrhea that is not controlled by your medication, call the clinic.  The clinic phone number is [phone]. Office hours are Monday-Friday 8:30am-5:00pm.  BELOW ARE SYMPTOMS THAT SHOULD BE REPORTED IMMEDIATELY:  *FEVER GREATER THAN 101.0 F  *CHILLS WITH OR WITHOUT FEVER  NAUSEA AND VOMITING THAT IS NOT CONTROLLED WITH YOUR NAUSEA MEDICATION  *UNUSUAL SHORTNESS OF BREATH  *UNUSUAL BRUISING OR BLEEDING  TENDERNESS IN MOUTH AND THROAT WITH OR WITHOUT PRESENCE OF ULCERS  *URINARY PROBLEMS  *BOWEL PROBLEMS  UNUSUAL RASH Items with * indicate a potential emergency and should be followed up as soon as possible. If you have an emergency after office hours please contact your primary care physician or go to the nearest emergency department.  Please call the clinic during office hours if you have any questions or concerns.   You may also contact the Patient Navigator at [phone] should you have any questions or need assistance in obtaining follow up care.      Resources For Cancer Patients and their Caregivers ? American Cancer Society: Can assist with transportation, wigs, general needs, runs Look Good Feel Better.        (509) 743-3433 ? Cancer Care: Provides financial assistance, online support groups, medication/co-pay assistance.  1-800-813-HOPE  (680) 832-4276) ? [facility] Assists [place] [employer] [employer] [place] cancer patients and their families through emotional , educational and financial support.  336 833 0885 ? [place] DSS Where to apply for food stamps, Medicaid and utility assistance. (647)822-8159 ? [employer]: Transportation to medical appointments. 980-231-1849 ? Social Security Administration: May apply for disability if have a Stage IV cancer. ([phone] 650-6086 231-255-5534 ? [employer], Disability and Transit Services: Assists with nutrition, care and transit needs. [phone]

## 2019-01-20 NOTE — Progress Notes (Signed)
[name] tolerated Topotecan infusion well without complaints or incident. Port left accessed and flushed for use tomorrow. VSS upon discharge. Pt discharged self ambulatory in satisfactory condition

## 2019-01-21 ENCOUNTER — Inpatient Hospital Stay (HOSPITAL_COMMUNITY): Payer: Medicare PPO

## 2019-01-21 ENCOUNTER — Encounter (HOSPITAL_COMMUNITY): Payer: Self-pay

## 2019-01-21 ENCOUNTER — Other Ambulatory Visit: Payer: Self-pay

## 2019-01-21 VITALS — BP 128/51 | HR 50 | Temp 98.5°F | Resp 18 | Wt 153.2 lb

## 2019-01-21 DIAGNOSIS — G629 Polyneuropathy, unspecified: Secondary | ICD-10-CM | POA: Diagnosis not present

## 2019-01-21 DIAGNOSIS — C3490 Malignant neoplasm of unspecified part of unspecified bronchus or lung: Secondary | ICD-10-CM

## 2019-01-21 DIAGNOSIS — C787 Secondary malignant neoplasm of liver and intrahepatic bile duct: Secondary | ICD-10-CM | POA: Diagnosis not present

## 2019-01-21 DIAGNOSIS — D6481 Anemia due to antineoplastic chemotherapy: Secondary | ICD-10-CM | POA: Diagnosis not present

## 2019-01-21 DIAGNOSIS — C3432 Malignant neoplasm of lower lobe, left bronchus or lung: Secondary | ICD-10-CM | POA: Diagnosis not present

## 2019-01-21 DIAGNOSIS — C3431 Malignant neoplasm of lower lobe, right bronchus or lung: Secondary | ICD-10-CM | POA: Diagnosis not present

## 2019-01-21 DIAGNOSIS — Z5111 Encounter for antineoplastic chemotherapy: Secondary | ICD-10-CM | POA: Diagnosis not present

## 2019-01-21 DIAGNOSIS — G893 Neoplasm related pain (acute) (chronic): Secondary | ICD-10-CM | POA: Diagnosis not present

## 2019-01-21 MED ORDER — PEGFILGRASTIM 6 MG/0.6ML ~~LOC~~ PSKT
PREFILLED_SYRINGE | SUBCUTANEOUS | Status: AC
  Filled 2019-01-21: qty 0.6

## 2019-01-21 MED ORDER — SODIUM CHLORIDE 0.9 % IV SOLN
Freq: Once | INTRAVENOUS | Status: AC
  Administered 2019-01-21: 13:00:00 via INTRAVENOUS

## 2019-01-21 MED ORDER — HEPARIN SOD (PORK) LOCK FLUSH 100 UNIT/ML IV SOLN
500.0000 [IU] | Freq: Once | INTRAVENOUS | Status: AC | PRN
  Administered 2019-01-21: 500 [IU]

## 2019-01-21 MED ORDER — SODIUM CHLORIDE 0.9% FLUSH
10.0000 mL | INTRAVENOUS | Status: DC | PRN
  Administered 2019-01-21: 10 mL
  Filled 2019-01-21: qty 10

## 2019-01-21 MED ORDER — PEGFILGRASTIM 6 MG/0.6ML ~~LOC~~ PSKT
6.0000 mg | PREFILLED_SYRINGE | Freq: Once | SUBCUTANEOUS | Status: AC
  Administered 2019-01-21: 15:00:00 6 mg via SUBCUTANEOUS

## 2019-01-21 MED ORDER — TOPOTECAN HCL CHEMO INJECTION 4 MG
1.2000 mg/m2 | Freq: Once | INTRAVENOUS | Status: AC
  Administered 2019-01-21: 2.2 mg via INTRAVENOUS
  Filled 2019-01-21: qty 2.2

## 2019-01-21 MED ORDER — SODIUM CHLORIDE 0.9 % IV SOLN
Freq: Once | INTRAVENOUS | Status: AC
  Administered 2019-01-21: 13:00:00 via INTRAVENOUS
  Filled 2019-01-21: qty 2

## 2019-01-21 NOTE — Progress Notes (Signed)
Harvie Junior tolerated Topotecan infusion with Neulasta on-pro well without complaints or incident. Neulasta on-pro applied to pt's right arm with green indicator light flashing upon discharge. VSS upon discharge. Pt discharged self ambulatory in satisfactory condition

## 2019-01-21 NOTE — Patient Instructions (Addendum)
[facility] Discharge Instructions for Patients Receiving Chemotherapy   Beginning January 23rd 2017 lab work for the [facility] will be done in the  Main lab at [facility] on 1st floor. If you have a lab appointment with the [facility] please come in thru the  Main Entrance and check in at the main information desk   Today you received the following chemotherapy agents Topotecan as well as Neulasta on-pro. Follow-up as scheduled. Call clinic for any questions or concerns  To help prevent nausea and vomiting after your treatment, we encourage you to take your nausea medication   If you develop nausea and vomiting, or diarrhea that is not controlled by your medication, call the clinic.  The clinic phone number is [phone]. Office hours are Monday-Friday 8:30am-5:00pm.  BELOW ARE SYMPTOMS THAT SHOULD BE REPORTED IMMEDIATELY:  *FEVER GREATER THAN 101.0 F  *CHILLS WITH OR WITHOUT FEVER  NAUSEA AND VOMITING THAT IS NOT CONTROLLED WITH YOUR NAUSEA MEDICATION  *UNUSUAL SHORTNESS OF BREATH  *UNUSUAL BRUISING OR BLEEDING  TENDERNESS IN MOUTH AND THROAT WITH OR WITHOUT PRESENCE OF ULCERS  *URINARY PROBLEMS  *BOWEL PROBLEMS  UNUSUAL RASH Items with * indicate a potential emergency and should be followed up as soon as possible. If you have an emergency after office hours please contact your primary care physician or go to the nearest emergency department.  Please call the clinic during office hours if you have any questions or concerns.   You may also contact the Patient Navigator at [phone] should you have any questions or need assistance in obtaining follow up care.      Resources For Cancer Patients and their Caregivers ? American Cancer Society: Can assist with transportation, wigs, general needs, runs Look Good Feel Better.        3207153817 ? Cancer Care: Provides financial assistance, online support groups, medication/co-pay  assistance.  1-800-813-HOPE 623-700-4738) ? [place] Assists [place] cancer patients and their families through emotional , educational and financial support.  512-657-9984 ? [place] DSS Where to apply for food stamps, Medicaid and utility assistance. 919-825-4868 ? [employer]: Transportation to medical appointments. 630 044 0559 ? Social Security Administration: May apply for disability if have a Stage IV cancer. ([phone] ? [employer], Disability and Transit Services: Assists with nutrition, care and transit needs. 224-866-6448

## 2019-01-26 ENCOUNTER — Other Ambulatory Visit: Payer: Self-pay | Admitting: Family Medicine

## 2019-01-26 DIAGNOSIS — F411 Generalized anxiety disorder: Secondary | ICD-10-CM

## 2019-01-26 DIAGNOSIS — F5101 Primary insomnia: Secondary | ICD-10-CM

## 2019-01-26 NOTE — Telephone Encounter (Signed)
Ok to refill??  Last office visit/ refill 12/28/2018

## 2019-02-14 ENCOUNTER — Other Ambulatory Visit: Payer: Self-pay

## 2019-02-14 ENCOUNTER — Encounter (HOSPITAL_COMMUNITY): Payer: Self-pay | Admitting: Hematology

## 2019-02-14 ENCOUNTER — Other Ambulatory Visit (HOSPITAL_COMMUNITY): Payer: Self-pay | Admitting: *Deleted

## 2019-02-14 ENCOUNTER — Inpatient Hospital Stay (HOSPITAL_COMMUNITY): Payer: Medicare PPO

## 2019-02-14 ENCOUNTER — Inpatient Hospital Stay (HOSPITAL_COMMUNITY): Payer: Medicare PPO | Attending: Hematology

## 2019-02-14 ENCOUNTER — Inpatient Hospital Stay (HOSPITAL_BASED_OUTPATIENT_CLINIC_OR_DEPARTMENT_OTHER): Payer: Medicare PPO | Admitting: Hematology

## 2019-02-14 ENCOUNTER — Encounter (HOSPITAL_COMMUNITY): Payer: Self-pay

## 2019-02-14 VITALS — BP 116/45 | HR 50 | Temp 97.5°F | Resp 17

## 2019-02-14 VITALS — BP 141/47 | HR 57 | Temp 97.3°F | Resp 18 | Wt 150.0 lb

## 2019-02-14 DIAGNOSIS — C3490 Malignant neoplasm of unspecified part of unspecified bronchus or lung: Secondary | ICD-10-CM

## 2019-02-14 DIAGNOSIS — I2510 Atherosclerotic heart disease of native coronary artery without angina pectoris: Secondary | ICD-10-CM | POA: Insufficient documentation

## 2019-02-14 DIAGNOSIS — F17210 Nicotine dependence, cigarettes, uncomplicated: Secondary | ICD-10-CM | POA: Insufficient documentation

## 2019-02-14 DIAGNOSIS — Z5189 Encounter for other specified aftercare: Secondary | ICD-10-CM | POA: Insufficient documentation

## 2019-02-14 DIAGNOSIS — C781 Secondary malignant neoplasm of mediastinum: Secondary | ICD-10-CM | POA: Diagnosis not present

## 2019-02-14 DIAGNOSIS — C3432 Malignant neoplasm of lower lobe, left bronchus or lung: Secondary | ICD-10-CM | POA: Insufficient documentation

## 2019-02-14 DIAGNOSIS — C787 Secondary malignant neoplasm of liver and intrahepatic bile duct: Secondary | ICD-10-CM | POA: Insufficient documentation

## 2019-02-14 DIAGNOSIS — T451X5A Adverse effect of antineoplastic and immunosuppressive drugs, initial encounter: Secondary | ICD-10-CM

## 2019-02-14 DIAGNOSIS — C3431 Malignant neoplasm of lower lobe, right bronchus or lung: Secondary | ICD-10-CM | POA: Diagnosis not present

## 2019-02-14 DIAGNOSIS — D6481 Anemia due to antineoplastic chemotherapy: Secondary | ICD-10-CM | POA: Diagnosis not present

## 2019-02-14 DIAGNOSIS — Z79899 Other long term (current) drug therapy: Secondary | ICD-10-CM | POA: Diagnosis not present

## 2019-02-14 DIAGNOSIS — G620 Drug-induced polyneuropathy: Secondary | ICD-10-CM | POA: Diagnosis not present

## 2019-02-14 DIAGNOSIS — M8580 Other specified disorders of bone density and structure, unspecified site: Secondary | ICD-10-CM | POA: Insufficient documentation

## 2019-02-14 DIAGNOSIS — D649 Anemia, unspecified: Secondary | ICD-10-CM | POA: Insufficient documentation

## 2019-02-14 DIAGNOSIS — J449 Chronic obstructive pulmonary disease, unspecified: Secondary | ICD-10-CM | POA: Insufficient documentation

## 2019-02-14 DIAGNOSIS — J320 Chronic maxillary sinusitis: Secondary | ICD-10-CM | POA: Diagnosis not present

## 2019-02-14 DIAGNOSIS — I252 Old myocardial infarction: Secondary | ICD-10-CM | POA: Diagnosis not present

## 2019-02-14 DIAGNOSIS — Z5111 Encounter for antineoplastic chemotherapy: Secondary | ICD-10-CM | POA: Diagnosis not present

## 2019-02-14 DIAGNOSIS — I700 Atherosclerosis of aorta: Secondary | ICD-10-CM | POA: Diagnosis not present

## 2019-02-14 LAB — CBC WITH DIFFERENTIAL/PLATELET
Abs Immature Granulocytes: 0.03 10*3/uL (ref 0.00–0.07)
Basophils Absolute: 0 10*3/uL (ref 0.0–0.1)
Basophils Relative: 1 %
Eosinophils Absolute: 0.1 10*3/uL (ref 0.0–0.5)
Eosinophils Relative: 2 %
HCT: 28.4 % — ABNORMAL LOW (ref 39.0–52.0)
Hemoglobin: 9.1 g/dL — ABNORMAL LOW (ref 13.0–17.0)
Immature Granulocytes: 0 %
Lymphocytes Relative: 17 %
Lymphs Abs: 1.3 10*3/uL (ref 0.7–4.0)
MCH: 31.7 pg (ref 26.0–34.0)
MCHC: 32 g/dL (ref 30.0–36.0)
MCV: 99 fL (ref 80.0–100.0)
Monocytes Absolute: 0.8 10*3/uL (ref 0.1–1.0)
Monocytes Relative: 11 %
Neutro Abs: 5.4 10*3/uL (ref 1.7–7.7)
Neutrophils Relative %: 69 %
Platelets: 335 10*3/uL (ref 150–400)
RBC: 2.87 MIL/uL — ABNORMAL LOW (ref 4.22–5.81)
RDW: 23.9 % — ABNORMAL HIGH (ref 11.5–15.5)
WBC: 7.8 10*3/uL (ref 4.0–10.5)
nRBC: 0 % (ref 0.0–0.2)

## 2019-02-14 LAB — COMPREHENSIVE METABOLIC PANEL
ALT: 11 U/L (ref 0–44)
AST: 15 U/L (ref 15–41)
Albumin: 4 g/dL (ref 3.5–5.0)
Alkaline Phosphatase: 55 U/L (ref 38–126)
Anion gap: 9 (ref 5–15)
BUN: 17 mg/dL (ref 8–23)
CO2: 24 mmol/L (ref 22–32)
Calcium: 8.9 mg/dL (ref 8.9–10.3)
Chloride: 105 mmol/L (ref 98–111)
Creatinine, Ser: 0.96 mg/dL (ref 0.61–1.24)
GFR calc Af Amer: >60 mL/min (ref >60)
GFR calc non Af Amer: >60 mL/min (ref >60)
Glucose, Bld: 98 mg/dL (ref 70–99)
Potassium: 4 mmol/L (ref 3.5–5.1)
Sodium: 138 mmol/L (ref 135–145)
Total Bilirubin: 0.5 mg/dL (ref 0.3–1.2)
Total Protein: 6.7 g/dL (ref 6.5–8.1)

## 2019-02-14 MED ORDER — SODIUM CHLORIDE 0.9% FLUSH
10.0000 mL | INTRAVENOUS | Status: DC | PRN
  Administered 2019-02-14 (×2): 10 mL
  Filled 2019-02-14 (×2): qty 10

## 2019-02-14 MED ORDER — SODIUM CHLORIDE 0.9 % IV SOLN
Freq: Once | INTRAVENOUS | Status: AC
  Administered 2019-02-14: 11:00:00 via INTRAVENOUS
  Filled 2019-02-14: qty 2

## 2019-02-14 MED ORDER — HEPARIN SOD (PORK) LOCK FLUSH 100 UNIT/ML IV SOLN
500.0000 [IU] | Freq: Once | INTRAVENOUS | Status: AC | PRN
  Administered 2019-02-14: 13:00:00 500 [IU]

## 2019-02-14 MED ORDER — SODIUM CHLORIDE 0.9 % IV SOLN
Freq: Once | INTRAVENOUS | Status: AC
  Administered 2019-02-14: 10:00:00 via INTRAVENOUS

## 2019-02-14 MED ORDER — GABAPENTIN 300 MG PO CAPS: 300.0000 mg | ORAL_CAPSULE | Freq: Three times a day (TID) | ORAL | 6 refills | Status: DC

## 2019-02-14 MED ORDER — TOPOTECAN HCL CHEMO INJECTION 4 MG
1.2000 mg/m2 | Freq: Once | INTRAVENOUS | Status: AC
  Administered 2019-02-14: 2.2 mg via INTRAVENOUS
  Filled 2019-02-14: qty 2.2

## 2019-02-14 MED ORDER — OXYCODONE-ACETAMINOPHEN 5-325 MG PO TABS: ORAL_TABLET | ORAL | 0 refills | Status: DC

## 2019-02-14 NOTE — Patient Instructions (Signed)
[facility] Discharge Instructions for Patients Receiving Chemotherapy  Today you received the following chemotherapy agents   To help prevent nausea and vomiting after your treatment, we encourage you to take your nausea medication   If you develop nausea and vomiting that is not controlled by your nausea medication, call the clinic.   BELOW ARE SYMPTOMS THAT SHOULD BE REPORTED IMMEDIATELY:  *FEVER GREATER THAN 100.5 F  *CHILLS WITH OR WITHOUT FEVER  NAUSEA AND VOMITING THAT IS NOT CONTROLLED WITH YOUR NAUSEA MEDICATION  *UNUSUAL SHORTNESS OF BREATH  *UNUSUAL BRUISING OR BLEEDING  TENDERNESS IN MOUTH AND THROAT WITH OR WITHOUT PRESENCE OF ULCERS  *URINARY PROBLEMS  *BOWEL PROBLEMS  UNUSUAL RASH Items with * indicate a potential emergency and should be followed up as soon as possible.  Feel free to call the clinic should you have any questions or concerns. The clinic phone number is [phone].  Please show the CHEMO ALERT CARD at check-in to the Emergency Department and triage nurse.   

## 2019-02-14 NOTE — Assessment & Plan Note (Signed)
1.  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 -Topotecan every 21 days started on 08/24/2017, dose reduced to 1.2 mg per metered square during cycle 4, frequency changed every 4 weeks for better tolerability. - CT CAP on 11/18/2018 showed dominant right lower lobe pulmonary nodule stable measuring 1.7 x 0.8 cm.  Left lower lobe pulmonary nodule measures 1.6 x 0.8 cm.  Overall stable disease with no progressive findings. - He will proceed with his next cycle of chemotherapy.  I have reviewed his blood work.  He is tolerating chemotherapy reasonably well. -I will see him back in 4 weeks for follow-up.  I plan to repeat CT CAP prior to next visit.  He gets brain MRI in [place].  2.  Peripheral neuropathy: - He is taking gabapentin 300 mg twice daily.  This is not controlling his symptoms.  I will increase it to 3 times a day.  3.  Chronic pain: - He is taking Percocet 1 and half tablet twice daily.  This is controlling pain. - I have sent a refill today.  4.  Normocytic anemia: -This is chemotherapy-induced.  Last Feraheme infusion was on 10/19/2017. - Hemoglobin is stable between 9 and 10.  Is not requiring any blood transfusion.

## 2019-02-14 NOTE — Patient Instructions (Signed)
[facility] at [facility] Discharge Instructions  You were seen today by Dr. Katragadda. He went over your recent lab results. He will see you back in 4 weeks for labs and follow up.   Thank you for choosing [facility] at [facility] to provide your oncology and hematology care.  To afford each patient quality time with our provider, please arrive at least 15 minutes before your scheduled appointment time.   If you have a lab appointment with the Cancer Center please come in thru the  Main Entrance and check in at the main information desk  You need to re-schedule your appointment should you arrive 10 or more minutes late.  We strive to give you quality time with our providers, and arriving late affects you and other patients whose appointments are after yours.  Also, if you no show three or more times for appointments you may be dismissed from the clinic at the providers discretion.     Again, thank you for choosing [facility].  Our hope is that these requests will decrease the amount of time that you wait before being seen by our physicians.       _____________________________________________________________  Should you have questions after your visit to [facility], please contact our office at [phone] between the hours of 8:00 a.m. and 4:30 p.m.  Voicemails left after 4:00 p.m. will not be returned until the following business day.  For prescription refill requests, have your pharmacy contact our office and allow 72 hours.    Cancer Center Support Programs:   > Cancer Support Group  2nd Tuesday of the month 1pm-2pm, Journey Room    

## 2019-02-14 NOTE — Progress Notes (Signed)
Pt presents today for treatment and f/u appt. VS within parameters for treatment. Lab reviewed by Dr. Delton Coombes. MAR reviewed.   Message received from [facility] LPN to proceed with treatment.   Treatment given today per MD orders. Tolerated infusion without adverse affects. Vital signs stable. No complaints at this time. Discharged from clinic ambulatory. F/U with [facility] as scheduled.

## 2019-02-14 NOTE — Progress Notes (Signed)
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 Bulky 5.4 cm right supraclavicular region malignant lymph node conglomeration with extracapsular extension. 2. Surrounding smaller abnormal right level 3 and level 5 lymph nodes, and the lymphadenopathy continues into the superior mediastinum, see Chest CT findings reported separately. 3. No other metastatic disease identified in the neck.   01/16/2017 Imaging   CT chest: IMPRESSION: 1. Extensive lymphadenopathy in the thorax and lower right cervical region, as discussed above. Primary differential considerations include lymphoma/leukemia or small cell carcinoma of the lung. Further evaluation a PET-CT could be considered to assess for additional sites of disease below the diaphragm if clinically appropriate. Additionally, ultrasound-guided biopsy of supraclavicular lymphadenopathy could be considered to establish a tissue diagnosis. 2. Indeterminate lesion in the periphery of segment 8 of the liver measuring 2.7 x 1.7 cm. Attention at time of follow-up PET-CT is recommended. 3. Aortic atherosclerosis, in addition to left main and 3 vessel coronary artery disease. Please note that although the presence of coronary artery calcium documents the presence of coronary artery disease, the severity of this disease and any potential stenosis cannot be assessed on this non-gated CT examination. Assessment for potential risk factor modification, dietary therapy or pharmacologic therapy may be warranted, if clinically indicated. 4. There are calcifications of the aortic valve.  Echocardiographic correlation for evaluation of potential valvular dysfunction may be warranted if clinically indicated. 5. Diffuse bronchial wall thickening with moderate centrilobular and paraseptal emphysema; imaging findings suggestive of underlying COPD.   02/03/2017 Initial Biopsy   (R) neck lymph node biopsy: SMALL CELL CARCINOMA (most likely lung primary).    02/03/2017 Miscellaneous   Port-a-cath attempted by IR; unable to place d/t enlarged SVC.    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 Staging for metastatic disease  EXAM: MRI HEAD WITHOUT AND WITH CONTRAST  TECHNIQUE: Multiplanar, multiecho pulse sequences of the brain and surrounding structures were obtained without and with intravenous contrast.  CONTRAST:  38m MULTIHANCE GADOBENATE DIMEGLUMINE 529 MG/ML IV SOLN  COMPARISON:  None.  FINDINGS: Brain: Negative for hydrocephalus. Cerebral volume normal for age. Small nonenhancing white matter hyperintensities consistent with mild chronic microvascular ischemia. No acute infarct. Negative for hemorrhage or mass or edema  Normal enhancement postcontrast infusion. No enhancing mass lesion. Leptomeningeal enhancement is normal.  Vascular: Normal arterial flow voids.  Normal venous enhancement  Skull and upper cervical spine: Negative  Sinuses/Orbits: Negative  Other: None  IMPRESSION: Negative for metastatic disease.  No acute abnormality.  Mild chronic white matter changes.   04/07/2017 Imaging    PET:  1. Marked reduction in size and metabolic activity of bulky RIGHT supraclavicular adenopathy mediastinal lymphadenopathy. 2. Residual moderate activity remains within small RIGHT supraclavicular lymph node, RIGHT lower paratracheal  lymph node and RIGHT hilar lymph node. 3. Resolution of prevascular and internal mammary mediastinal metastatic hypermetabolic activity. 4. Resolution of metabolic activity associated with solitary RIGHT  hepatic lobe liver metastasis. 5. No evidence of disease progression. 6. 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   05/27/2017 Imaging   MRI brain w/ and w/o contrast IMPRESSION: 1. No metastatic disease identified. 2. Increased nonspecific cerebral white matter signal changes since August. These are most commonly small vessel disease related. 3. New right maxillary sinusitis. Benign appearing retention cysts in the nasopharynx with trace mastoid effusions.   06/12/2017 Imaging   PET-CT IMPRESSION: 1. There are two new hypermetabolic nodules identified within both lower lobes measuring up to 3.1 cm. The appearance is nonspecific and may be inflammatory/infectious in etiology. Pulmonary metastatic disease cannot be excluded and short-term follow-up imaging in 3 months is advised to reassess these nodules. 2. Stable appearance of mild hypermetabolic activity associated with right paratracheal and right hilar lymph nodes. 3. Decrease in FDG uptake associated with index right supraclavicular lymph node. 4. 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 5. Aortic Atherosclerosis (ICD10-I70.0) and Emphysema (ICD10-J43.9).   08/24/2017 -  Chemotherapy   The patient had pegfilgrastim (NEULASTA ONPRO KIT) injection 6 mg, 6 mg, Subcutaneous, Once, 21 of 23 cycles Administration: 6 mg (08/28/2017), 6 mg (09/18/2017), 6 mg (10/09/2017), 6 mg (11/02/2017), 6 mg (11/20/2017), 6 mg (12/18/2017), 6 mg (01/15/2018), 6 mg (02/12/2018), 6 mg (03/12/2018), 6 mg (04/09/2018), 6 mg (05/07/2018), 6 mg (06/04/2018), 6 mg  (07/02/2018), 6 mg (07/30/2018), 6 mg (08/27/2018), 6 mg (09/24/2018), 6 mg (10/29/2018), 6 mg (11/26/2018), 6 mg (12/24/2018), 6 mg (01/21/2019) topotecan (HYCAMTIN) 2.9 mg in sodium chloride 0.9 % 100 mL chemo infusion, 1.5 mg/m2 = 2.9 mg, Intravenous,  Once, 21 of 23 cycles Dose modification: 1.2 mg/m2 (80 % of original dose 1.5 mg/m2, Cycle 4, Reason: Dose Not Tolerated) Administration: 2.9 mg (08/24/2017), 2.9 mg (08/25/2017), 2.9 mg (08/28/2017), 2.9 mg (08/26/2017), 2.9 mg (08/27/2017), 2.9 mg (09/14/2017), 2.9 mg (09/15/2017), 2.9 mg (09/16/2017), 2.9 mg (09/17/2017), 2.9 mg (09/18/2017), 2.9 mg (10/05/2017), 2.9 mg (10/06/2017), 2.9 mg (10/07/2017), 2.9 mg (10/08/2017), 2.9 mg (10/09/2017), 2.3 mg (10/26/2017), 2.3 mg (10/27/2017), 2.3 mg (10/28/2017), 2.3 mg (10/29/2017), 2.3 mg (11/02/2017), 2.3 mg (11/16/2017), 2.3 mg (11/17/2017), 2.3 mg (11/18/2017), 2.3 mg (11/19/2017), 2.3 mg (11/20/2017), 2.3 mg (12/14/2017), 2.3 mg (12/15/2017), 2.3 mg (12/16/2017), 2.3 mg (12/17/2017), 2.3 mg (12/18/2017), 2.3 mg (01/11/2018), 2.3 mg (01/12/2018), 2.3 mg (01/13/2018), 2.3 mg (01/15/2018), 2.3 mg (02/08/2018), 2.3 mg (02/09/2018), 2.3 mg (02/10/2018), 2.3 mg (02/11/2018), 2.3 mg (02/12/2018), 2.3 mg (03/08/2018), 2.3 mg (03/09/2018), 2.3 mg (03/10/2018), 2.3 mg (03/11/2018), 2.3 mg (03/12/2018), 2.2 mg (04/05/2018), 2.2 mg (04/06/2018), 2.2 mg (04/07/2018), 2.2 mg (04/08/2018), 2.2 mg (04/09/2018), 2.2 mg (05/03/2018), 2.2 mg (05/04/2018), 2.2 mg (05/05/2018), 2.2 mg (05/06/2018), 2.2 mg (05/07/2018), 2.2 mg (05/31/2018), 2.2 mg (06/01/2018), 2.2 mg (06/02/2018), 2.2 mg (06/03/2018), 2.2 mg (06/04/2018), 2.2 mg (06/28/2018), 2.2 mg (06/29/2018), 2.2 mg (06/30/2018), 2.2 mg (07/01/2018), 2.2 mg (07/02/2018), 2.2 mg (07/26/2018), 2.2 mg (07/27/2018), 2.2 mg (07/28/2018), 2.2 mg (07/29/2018), 2.2 mg (07/30/2018), 2.2 mg (08/23/2018), 2.2 mg (08/24/2018), 2.2 mg (08/25/2018), 2.2 mg (08/26/2018), 2.2 mg (08/27/2018), 2.2 mg (09/20/2018), 2.2 mg (09/21/2018), 2.2 mg (09/22/2018), 2.2 mg (09/23/2018), 2.2  mg (09/24/2018), 2.2 mg (10/25/2018), 2.2 mg (10/26/2018), 2.2 mg (10/27/2018), 2.2 mg (10/28/2018), 2.2 mg (10/29/2018), 2.2 mg (11/22/2018), 2.2 mg (11/23/2018), 2.2 mg (11/24/2018), 2.2 mg (11/25/2018), 2.2 mg (11/26/2018), 2.2 mg (12/20/2018), 2.2 mg (12/21/2018), 2.2 mg (12/22/2018),  2.2 mg (12/23/2018), 2.2 mg (12/24/2018), 2.2 mg (01/17/2019), 2.2 mg (01/18/2019), 2.2 mg (01/19/2019), 2.2 mg (01/20/2019), 2.2 mg (01/21/2019), 2.2 mg (02/14/2019) ondansetron (ZOFRAN) 4 mg in sodium chloride 0.9 % 50 mL IVPB, , Intravenous,  Once, 16 of 18 cycles Administration:  (02/08/2018),  (02/09/2018),  (02/10/2018),  (02/11/2018),  (02/12/2018),  (03/08/2018),  (03/09/2018),  (03/10/2018),  (03/11/2018),  (03/12/2018),  (04/05/2018),  (04/06/2018),  (04/07/2018),  (04/08/2018),  (04/09/2018),  (05/03/2018),  (05/04/2018),  (05/05/2018),  (05/06/2018),  (05/07/2018),  (05/31/2018),  (06/01/2018),  (06/02/2018),  (06/03/2018),  (06/04/2018),  (06/28/2018),  (06/29/2018),  (06/30/2018),  (07/01/2018),  (07/02/2018),  (07/26/2018),  (07/27/2018),  (07/28/2018),  (07/29/2018),  (07/30/2018),  (08/23/2018),  (08/24/2018),  (08/25/2018),  (08/26/2018),  (08/27/2018),  (09/20/2018),  (09/21/2018),  (09/22/2018),  (09/23/2018),  (09/24/2018),  (10/25/2018),  (10/26/2018),  (10/27/2018),  (10/28/2018),  (10/29/2018),  (11/22/2018),  (11/23/2018),  (11/24/2018),  (11/25/2018),  (11/26/2018),  (12/20/2018),  (12/21/2018),  (12/22/2018),  (12/23/2018),  (12/24/2018),  (01/17/2019),  (01/18/2019),  (01/19/2019),  (01/20/2019),  (01/21/2019),  (02/14/2019)  for chemotherapy treatment.         INTERVAL HISTORY:  [name] 68 y.o. male seen for follow-up of his small cell lung cancer.  He is continuing to tolerate his chemotherapy very well.  Pain is well controlled on the current regimen of 1 and half tablets twice daily.  Appetite is 25%.  Energy levels are 50%.  Reports that his neuropathy in the feet has not been well controlled.  Is taking gabapentin twice daily.  Denies any nausea vomiting or diarrhea.  Has  occasional constipation.  No fevers or chills noted.    REVIEW OF SYSTEMS:  Review of Systems  Respiratory: Negative for cough.   Gastrointestinal: Positive for constipation.  Musculoskeletal: Negative for back pain.  Neurological: Positive for numbness.  Psychiatric/Behavioral: Positive for sleep disturbance.  All other systems reviewed and are negative.    PAST MEDICAL/SURGICAL HISTORY:  Past Medical History:  Diagnosis Date   Anxiety    CAD (coronary artery disease)    Cancer (HCC)    stage 4 small cell lung cancer    COPD (chronic obstructive pulmonary disease) (HCC)    Depression    Dyspnea    increased exertion   Feeling of chest tightness    Heart palpitations    History of chemotherapy    Myocardial infarction ([place])    Osteopenia    Panic attacks    Smoker    Past Surgical History:  Procedure Laterality Date   BACK SURGERY  12/24/2000   L5,S1   CORONARY STENT PLACEMENT  2005   RCA & CX   HERNIA REPAIR Right 1980's   INGUINAL HERNIA REPAIR  12/1978   right side   IR FLUORO GUIDE PORT INSERTION RIGHT  04/02/2017   IR US GUIDE BX ASP/DRAIN  02/03/2017   IR US GUIDE VASC ACCESS RIGHT  04/02/2017   NM MYOCAR PERF WALL MOTION  09/07/2009   No ischemia; EF 51%   SHOULDER SURGERY Left 08/2010   SPINE SURGERY  2002   L5-S1     SOCIAL HISTORY:  Social History   Socioeconomic History   Marital status: Single    Spouse name: Not on file   Number of children: Not on file   Years of education: Not on file   Highest education level: Not on file  Occupational History   Not on file  Social Needs   Financial resource strain: Not on file   Food insecurity  Worry: Not on file    Inability: Not on file   Transportation needs    Medical: Not on file    Non-medical: Not on file  Tobacco Use   Smoking status: Current Every Day Smoker    Packs/day: 1.00    Types: Cigarettes   Smokeless tobacco: Never Used  Substance and Sexual  Activity   Alcohol use: Yes    Comment: occas   Drug use: No   Sexual activity: Not on file  Lifestyle   Physical activity    Days per week: Not on file    Minutes per session: Not on file   Stress: Not on file  Relationships   Social connections    Talks on phone: Not on file    Gets together: Not on file    Attends religious service: Not on file    Active member of club or organization: Not on file    Attends meetings of clubs or organizations: Not on file    Relationship status: Not on file   Intimate partner violence    Fear of current or ex partner: Not on file    Emotionally abused: Not on file    Physically abused: Not on file    Forced sexual activity: Not on file  Other Topics Concern   Not on file  Social History Narrative   Not on file    FAMILY HISTORY:  Family History  Problem Relation Age of Onset   Heart attack Father    Kidney disease Father        renal failure   Heart failure Mother    Heart attack Mother    Cancer Brother    Diabetes Brother    Alcohol abuse Brother    Diabetes Sister     CURRENT MEDICATIONS:  Outpatient Encounter Medications as of 02/14/2019  Medication Sig   ALPRAZolam (XANAX) 1 MG tablet TAKE (1) TABLET BY MOUTH (4) TIMES DAILY.   aspirin EC 81 MG tablet Take 81 mg by mouth daily.   atorvastatin (LIPITOR) 40 MG tablet TAKE (1) TABLET BY MOUTH ONCE DAILY.   clotrimazole-betamethasone (LOTRISONE) cream Apply 1 application topically 2 (two) times daily.   cyclobenzaprine (FLEXERIL) 5 MG tablet TAKE 1 TABLET THREE TIMES DAILY AS NEEDED FOR MUSCLE SPASMS.   gabapentin (NEURONTIN) 300 MG capsule Take 1 capsule (300 mg total) by mouth 3 (three) times daily.   hydrOXYzine (ATARAX/VISTARIL) 50 MG tablet TAKE ONE TABLET BY MOUTH AT BEDTIME AS NEEDED FOR SLEEP.   lidocaine-prilocaine (EMLA) cream Apply to affected area once   ondansetron (ZOFRAN) 8 MG tablet Take 1 tablet (8 mg total) by mouth 2 (two) times  daily as needed. Start on the third day after cisplatin chemotherapy.   Pegfilgrastim (NEULASTA ONPRO [place]) Inject into the skin. Every 21 days   polyethylene glycol powder (GLYCOLAX/MIRALAX) powder Take 17 g by mouth daily.   prochlorperazine (COMPAZINE) 10 MG tablet Take 1 tablet (10 mg total) by mouth every 6 (six) hours as needed (Nausea or vomiting).   TOPOTECAN HCL IV Inject into the vein.   topotecan in sodium chloride 0.9 % 100 mL Inject into the vein once. Days 1-5 every 21 days   traMADol (ULTRAM) 50 MG tablet TAKE (1) TABLET BY MOUTH (3) TIMES DAILY.   [DISCONTINUED] gabapentin (NEURONTIN) 300 MG capsule TAKE (1) CAPSULE BY MOUTH TWICE DAILY.   [DISCONTINUED] oxyCODONE-acetaminophen (PERCOCET/ROXICET) 5-325 MG tablet Take 1 tablet by mouth every 8 (eight) hours as needed for severe  pain.   No facility-administered encounter medications on file as of 02/14/2019.     ALLERGIES:  Allergies  Allergen Reactions   Codeine Nausea Only   Niaspan [Niacin Er]      PHYSICAL EXAM:  ECOG Performance status: 1  Vitals:   02/14/19 0918  BP: (!) 141/47  Pulse: (!) 57  Resp: 18  Temp: (!) 97.3 F (36.3 C)  SpO2: 99%   Filed Weights   02/14/19 0918  Weight: 150 lb (68 kg)    Physical Exam Vitals signs reviewed.  Constitutional:      Appearance: Normal appearance.  Cardiovascular:     Rate and Rhythm: Normal rate and regular rhythm.     Heart sounds: Normal heart sounds.  Pulmonary:     Effort: Pulmonary effort is normal.     Breath sounds: Normal breath sounds.  Abdominal:     General: There is no distension.     Palpations: Abdomen is soft. There is no mass.  Musculoskeletal:        General: No swelling.  Skin:    General: Skin is warm.  Neurological:     General: No focal deficit present.     Mental Status: He is alert and oriented to person, place, and time.  Psychiatric:        Mood and Affect: Mood normal.        Behavior: Behavior normal.       LABORATORY DATA:  I have reviewed the labs as listed.  CBC    Component Value Date/Time   WBC 7.8 02/14/2019 0910   RBC 2.87 (L) 02/14/2019 0910   HGB 9.1 (L) 02/14/2019 0910   HCT 28.4 (L) 02/14/2019 0910   PLT 335 02/14/2019 0910   MCV 99.0 02/14/2019 0910   MCH 31.7 02/14/2019 0910   MCHC 32.0 02/14/2019 0910   RDW 23.9 (H) 02/14/2019 0910   LYMPHSABS 1.3 02/14/2019 0910   MONOABS 0.8 02/14/2019 0910   EOSABS 0.1 02/14/2019 0910   BASOSABS 0.0 02/14/2019 0910   CMP Latest Ref Rng & Units 02/14/2019 01/17/2019 12/20/2018  Glucose 70 - 99 mg/dL 98 97 100(H)  BUN 8 - 23 mg/dL '17 21 16  '$ Creatinine 0.61 - 1.24 mg/dL 0.96 0.86 0.77  Sodium 135 - 145 mmol/L 138 137 137  Potassium 3.5 - 5.1 mmol/L 4.0 4.0 4.0  Chloride 98 - 111 mmol/L 105 106 104  CO2 22 - 32 mmol/L '24 25 22  '$ Calcium 8.9 - 10.3 mg/dL 8.9 8.9 8.9  Total Protein 6.5 - 8.1 g/dL 6.7 6.5 6.7  Total Bilirubin 0.3 - 1.2 mg/dL 0.5 0.3 0.3  Alkaline Phos 38 - 126 U/L 55 60 55  AST 15 - 41 U/L 15 16 14(L)  ALT 0 - 44 U/L '11 14 13    '$ I have reviewed his scans independently and discussed with the patient.   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 -Topotecan every 21 days started on 08/24/2017, dose reduced to 1.2 mg per metered square during cycle 4, frequency changed every 4 weeks for better tolerability. - CT CAP on 11/18/2018 showed dominant right lower lobe pulmonary nodule stable measuring 1.7 x 0.8 cm.  Left lower lobe pulmonary nodule measures 1.6 x 0.8 cm.  Overall stable disease  with no progressive findings. - He will proceed with his next cycle of chemotherapy.  I have reviewed his blood work.  He is tolerating chemotherapy reasonably well. -I will see him back in  4 weeks for follow-up.  I plan to repeat CT CAP prior to next visit.  He gets brain MRI in [place].  2.  Peripheral neuropathy: - He is taking gabapentin 300 mg twice daily.  This is not controlling his symptoms.  I will increase it to 3 times a day.  3.  Chronic pain: - He is taking Percocet 1 and half tablet twice daily.  This is controlling pain. - I have sent a refill today.  4.  Normocytic anemia: -This is chemotherapy-induced.  Last Feraheme infusion was on 10/19/2017. - Hemoglobin is stable between 9 and 10.  Is not requiring any blood transfusion.  Total time spent is 25 minutes with more than 50% of the time spent face-to-face discussing treatment plan and coordination of care.    Orders placed this encounter:  Orders Placed This Encounter  Procedures   CT Abdomen Pelvis W Contrast   CT Chest W Contrast      Derek Jack, MD [place] [phone]

## 2019-02-15 ENCOUNTER — Inpatient Hospital Stay (HOSPITAL_COMMUNITY): Payer: Medicare PPO

## 2019-02-15 ENCOUNTER — Encounter (HOSPITAL_COMMUNITY): Payer: Self-pay

## 2019-02-15 VITALS — BP 108/42 | HR 50 | Temp 97.6°F | Resp 18

## 2019-02-15 DIAGNOSIS — Z5111 Encounter for antineoplastic chemotherapy: Secondary | ICD-10-CM | POA: Diagnosis not present

## 2019-02-15 DIAGNOSIS — C787 Secondary malignant neoplasm of liver and intrahepatic bile duct: Secondary | ICD-10-CM | POA: Diagnosis not present

## 2019-02-15 DIAGNOSIS — C3431 Malignant neoplasm of lower lobe, right bronchus or lung: Secondary | ICD-10-CM | POA: Diagnosis not present

## 2019-02-15 DIAGNOSIS — I2510 Atherosclerotic heart disease of native coronary artery without angina pectoris: Secondary | ICD-10-CM | POA: Diagnosis not present

## 2019-02-15 DIAGNOSIS — I700 Atherosclerosis of aorta: Secondary | ICD-10-CM | POA: Diagnosis not present

## 2019-02-15 DIAGNOSIS — C3432 Malignant neoplasm of lower lobe, left bronchus or lung: Secondary | ICD-10-CM | POA: Diagnosis not present

## 2019-02-15 DIAGNOSIS — F17210 Nicotine dependence, cigarettes, uncomplicated: Secondary | ICD-10-CM | POA: Diagnosis not present

## 2019-02-15 DIAGNOSIS — D6481 Anemia due to antineoplastic chemotherapy: Secondary | ICD-10-CM | POA: Diagnosis not present

## 2019-02-15 DIAGNOSIS — I252 Old myocardial infarction: Secondary | ICD-10-CM | POA: Diagnosis not present

## 2019-02-15 DIAGNOSIS — C3490 Malignant neoplasm of unspecified part of unspecified bronchus or lung: Secondary | ICD-10-CM

## 2019-02-15 MED ORDER — TOPOTECAN HCL CHEMO INJECTION 4 MG
1.2000 mg/m2 | Freq: Once | INTRAVENOUS | Status: AC
  Administered 2019-02-15: 2.2 mg via INTRAVENOUS
  Filled 2019-02-15: qty 2.2

## 2019-02-15 MED ORDER — HEPARIN SOD (PORK) LOCK FLUSH 100 UNIT/ML IV SOLN
500.0000 [IU] | Freq: Once | INTRAVENOUS | Status: AC | PRN
  Administered 2019-02-15: 500 [IU]

## 2019-02-15 MED ORDER — SODIUM CHLORIDE 0.9 % IV SOLN
Freq: Once | INTRAVENOUS | Status: AC
  Administered 2019-02-15: 13:00:00 via INTRAVENOUS
  Filled 2019-02-15: qty 2

## 2019-02-15 MED ORDER — SODIUM CHLORIDE 0.9% FLUSH
10.0000 mL | INTRAVENOUS | Status: DC | PRN
  Administered 2019-02-15: 10 mL
  Filled 2019-02-15: qty 10

## 2019-02-15 MED ORDER — SODIUM CHLORIDE 0.9 % IV SOLN
Freq: Once | INTRAVENOUS | Status: AC
  Administered 2019-02-15: 13:00:00 via INTRAVENOUS

## 2019-02-15 NOTE — Progress Notes (Signed)
[name] tolerated Topotecan infusion well without complaints or incident.Port left accessed and flushed for use tomorrow VSS upon discharge. Pt discharged self ambulatory in satisfactory condition.

## 2019-02-15 NOTE — Patient Instructions (Signed)
[facility] Discharge Instructions for Patients Receiving Chemotherapy   Beginning January 23rd 2017 lab work for the [facility] will be done in the  Main lab at [facility] on 1st floor. If you have a lab appointment with the [facility] please come in thru the  Main Entrance and check in at the main information desk   Today you received the following chemotherapy agents Topotecan. Follow-up as scheduled. Call clinic for any questions or concerns  To help prevent nausea and vomiting after your treatment, we encourage you to take your nausea medication   If you develop nausea and vomiting, or diarrhea that is not controlled by your medication, call the clinic.  The clinic phone number is [phone]. Office hours are Monday-Friday 8:30am-5:00pm.  BELOW ARE SYMPTOMS THAT SHOULD BE REPORTED IMMEDIATELY:  *FEVER GREATER THAN 101.0 F  *CHILLS WITH OR WITHOUT FEVER  NAUSEA AND VOMITING THAT IS NOT CONTROLLED WITH YOUR NAUSEA MEDICATION  *UNUSUAL SHORTNESS OF BREATH  *UNUSUAL BRUISING OR BLEEDING  TENDERNESS IN MOUTH AND THROAT WITH OR WITHOUT PRESENCE OF ULCERS  *URINARY PROBLEMS  *BOWEL PROBLEMS  UNUSUAL RASH Items with * indicate a potential emergency and should be followed up as soon as possible. If you have an emergency after office hours please contact your primary care physician or go to the nearest emergency department.  Please call the clinic during office hours if you have any questions or concerns.   You may also contact the Patient Navigator at [phone] should you have any questions or need assistance in obtaining follow up care.      Resources For Cancer Patients and their Caregivers ? American Cancer Society: Can assist with transportation, wigs, general needs, runs Look Good Feel Better.        479-435-8967 ? Cancer Care: Provides financial assistance, online support groups, medication/co-pay assistance.  1-800-813-HOPE  2140681754) ? [facility] Assists [place] cancer patients and their families through emotional , educational and financial support.  (825)652-4919 ? [place] DSS Where to apply for food stamps, Medicaid and utility assistance. 208 712 7431 ? [facility]: Transportation to medical appointments. 9131486401 ? Social Security Administration: May apply for disability if have a Stage IV cancer. ([phone] 070-7514 (415)[phone] ? [employer], Disability and Transit Services: Assists with nutrition, care and transit needs. [phone]

## 2019-02-16 ENCOUNTER — Inpatient Hospital Stay (HOSPITAL_COMMUNITY): Payer: Medicare PPO

## 2019-02-16 ENCOUNTER — Encounter: Payer: Self-pay | Admitting: General Practice

## 2019-02-16 ENCOUNTER — Other Ambulatory Visit (HOSPITAL_COMMUNITY): Payer: Self-pay | Admitting: Nurse Practitioner

## 2019-02-16 ENCOUNTER — Other Ambulatory Visit (HOSPITAL_COMMUNITY): Payer: Self-pay | Admitting: *Deleted

## 2019-02-16 ENCOUNTER — Other Ambulatory Visit: Payer: Self-pay

## 2019-02-16 VITALS — BP 102/44 | HR 52 | Temp 97.6°F | Resp 18 | Wt 152.8 lb

## 2019-02-16 DIAGNOSIS — I252 Old myocardial infarction: Secondary | ICD-10-CM | POA: Diagnosis not present

## 2019-02-16 DIAGNOSIS — D6481 Anemia due to antineoplastic chemotherapy: Secondary | ICD-10-CM | POA: Diagnosis not present

## 2019-02-16 DIAGNOSIS — C3432 Malignant neoplasm of lower lobe, left bronchus or lung: Secondary | ICD-10-CM | POA: Diagnosis not present

## 2019-02-16 DIAGNOSIS — I2510 Atherosclerotic heart disease of native coronary artery without angina pectoris: Secondary | ICD-10-CM | POA: Diagnosis not present

## 2019-02-16 DIAGNOSIS — C3431 Malignant neoplasm of lower lobe, right bronchus or lung: Secondary | ICD-10-CM | POA: Diagnosis not present

## 2019-02-16 DIAGNOSIS — C3490 Malignant neoplasm of unspecified part of unspecified bronchus or lung: Secondary | ICD-10-CM

## 2019-02-16 DIAGNOSIS — I700 Atherosclerosis of aorta: Secondary | ICD-10-CM | POA: Diagnosis not present

## 2019-02-16 DIAGNOSIS — Z5111 Encounter for antineoplastic chemotherapy: Secondary | ICD-10-CM | POA: Diagnosis not present

## 2019-02-16 DIAGNOSIS — T451X5A Adverse effect of antineoplastic and immunosuppressive drugs, initial encounter: Secondary | ICD-10-CM

## 2019-02-16 DIAGNOSIS — G620 Drug-induced polyneuropathy: Secondary | ICD-10-CM

## 2019-02-16 DIAGNOSIS — C787 Secondary malignant neoplasm of liver and intrahepatic bile duct: Secondary | ICD-10-CM | POA: Diagnosis not present

## 2019-02-16 DIAGNOSIS — F17210 Nicotine dependence, cigarettes, uncomplicated: Secondary | ICD-10-CM | POA: Diagnosis not present

## 2019-02-16 MED ORDER — SODIUM CHLORIDE 0.9% FLUSH
10.0000 mL | INTRAVENOUS | Status: DC | PRN
  Administered 2019-02-16: 10 mL
  Filled 2019-02-16: qty 10

## 2019-02-16 MED ORDER — HEPARIN SOD (PORK) LOCK FLUSH 100 UNIT/ML IV SOLN
500.0000 [IU] | Freq: Once | INTRAVENOUS | Status: AC | PRN
  Administered 2019-02-16: 500 [IU]

## 2019-02-16 MED ORDER — TOPOTECAN HCL CHEMO INJECTION 4 MG
1.2000 mg/m2 | Freq: Once | INTRAVENOUS | Status: AC
  Administered 2019-02-16: 2.2 mg via INTRAVENOUS
  Filled 2019-02-16: qty 2.2

## 2019-02-16 MED ORDER — SODIUM CHLORIDE 0.9 % IV SOLN
Freq: Once | INTRAVENOUS | Status: AC
  Administered 2019-02-16: 14:00:00 via INTRAVENOUS
  Filled 2019-02-16: qty 2

## 2019-02-16 MED ORDER — GABAPENTIN 300 MG PO CAPS: 300.0000 mg | ORAL_CAPSULE | Freq: Two times a day (BID) | ORAL | 0 refills | Status: DC

## 2019-02-16 MED ORDER — SODIUM CHLORIDE 0.9 % IV SOLN
Freq: Once | INTRAVENOUS | Status: AC
  Administered 2019-02-16: 13:00:00 via INTRAVENOUS

## 2019-02-16 NOTE — Progress Notes (Signed)
Treatment given per orders. Patient tolerated it well without problems. Vitals stable and discharged home from clinic ambulatory. Follow up as scheduled.  

## 2019-02-16 NOTE — Patient Instructions (Signed)
[facility] Discharge Instructions for Patients Receiving Chemotherapy  Today you received the following chemotherapy agents   To help prevent nausea and vomiting after your treatment, we encourage you to take your nausea medication   If you develop nausea and vomiting that is not controlled by your nausea medication, call the clinic.   BELOW ARE SYMPTOMS THAT SHOULD BE REPORTED IMMEDIATELY:  *FEVER GREATER THAN 100.5 F  *CHILLS WITH OR WITHOUT FEVER  NAUSEA AND VOMITING THAT IS NOT CONTROLLED WITH YOUR NAUSEA MEDICATION  *UNUSUAL SHORTNESS OF BREATH  *UNUSUAL BRUISING OR BLEEDING  TENDERNESS IN MOUTH AND THROAT WITH OR WITHOUT PRESENCE OF ULCERS  *URINARY PROBLEMS  *BOWEL PROBLEMS  UNUSUAL RASH Items with * indicate a potential emergency and should be followed up as soon as possible.  Feel free to call the clinic should you have any questions or concerns. The clinic phone number is [phone].  Please show the CHEMO ALERT CARD at check-in to the Emergency Department and triage nurse.   

## 2019-02-16 NOTE — Progress Notes (Signed)
[facility] CSW Progress Notes  Call to patient - confirmed they are on list to receive box of food and essential items from [place] monthly.  Edwyna Shell, LCSW Clinical Social Worker Phone:  [phone]

## 2019-02-17 ENCOUNTER — Inpatient Hospital Stay (HOSPITAL_COMMUNITY): Payer: Medicare PPO

## 2019-02-17 VITALS — BP 129/49 | HR 49 | Temp 97.8°F | Resp 16

## 2019-02-17 DIAGNOSIS — C3490 Malignant neoplasm of unspecified part of unspecified bronchus or lung: Secondary | ICD-10-CM

## 2019-02-17 DIAGNOSIS — C3431 Malignant neoplasm of lower lobe, right bronchus or lung: Secondary | ICD-10-CM | POA: Diagnosis not present

## 2019-02-17 DIAGNOSIS — D6481 Anemia due to antineoplastic chemotherapy: Secondary | ICD-10-CM | POA: Diagnosis not present

## 2019-02-17 DIAGNOSIS — Z5111 Encounter for antineoplastic chemotherapy: Secondary | ICD-10-CM | POA: Diagnosis not present

## 2019-02-17 DIAGNOSIS — C3432 Malignant neoplasm of lower lobe, left bronchus or lung: Secondary | ICD-10-CM | POA: Diagnosis not present

## 2019-02-17 DIAGNOSIS — I2510 Atherosclerotic heart disease of native coronary artery without angina pectoris: Secondary | ICD-10-CM | POA: Diagnosis not present

## 2019-02-17 DIAGNOSIS — F17210 Nicotine dependence, cigarettes, uncomplicated: Secondary | ICD-10-CM | POA: Diagnosis not present

## 2019-02-17 DIAGNOSIS — C787 Secondary malignant neoplasm of liver and intrahepatic bile duct: Secondary | ICD-10-CM | POA: Diagnosis not present

## 2019-02-17 DIAGNOSIS — I700 Atherosclerosis of aorta: Secondary | ICD-10-CM | POA: Diagnosis not present

## 2019-02-17 DIAGNOSIS — I252 Old myocardial infarction: Secondary | ICD-10-CM | POA: Diagnosis not present

## 2019-02-17 MED ORDER — SODIUM CHLORIDE 0.9 % IV SOLN
Freq: Once | INTRAVENOUS | Status: AC
  Administered 2019-02-17: 13:00:00 via INTRAVENOUS
  Filled 2019-02-17: qty 2

## 2019-02-17 MED ORDER — TOPOTECAN HCL CHEMO INJECTION 4 MG
1.2000 mg/m2 | Freq: Once | INTRAVENOUS | Status: AC
  Administered 2019-02-17: 14:00:00 2.2 mg via INTRAVENOUS
  Filled 2019-02-17: qty 2.2

## 2019-02-17 MED ORDER — HEPARIN SOD (PORK) LOCK FLUSH 100 UNIT/ML IV SOLN
500.0000 [IU] | Freq: Once | INTRAVENOUS | Status: AC | PRN
  Administered 2019-02-17: 15:00:00 500 [IU]

## 2019-02-17 MED ORDER — SODIUM CHLORIDE 0.9 % IV SOLN
Freq: Once | INTRAVENOUS | Status: AC
  Administered 2019-02-17: 13:00:00 via INTRAVENOUS

## 2019-02-17 MED ORDER — SODIUM CHLORIDE 0.9% FLUSH
10.0000 mL | INTRAVENOUS | Status: DC | PRN
  Administered 2019-02-17: 10 mL
  Filled 2019-02-17: qty 10

## 2019-02-17 NOTE — Patient Instructions (Signed)
[facility] Discharge Instructions for Patients Receiving Chemotherapy  Today you received the following chemotherapy agents   To help prevent nausea and vomiting after your treatment, we encourage you to take your nausea medication   If you develop nausea and vomiting that is not controlled by your nausea medication, call the clinic.   BELOW ARE SYMPTOMS THAT SHOULD BE REPORTED IMMEDIATELY:  *FEVER GREATER THAN 100.5 F  *CHILLS WITH OR WITHOUT FEVER  NAUSEA AND VOMITING THAT IS NOT CONTROLLED WITH YOUR NAUSEA MEDICATION  *UNUSUAL SHORTNESS OF BREATH  *UNUSUAL BRUISING OR BLEEDING  TENDERNESS IN MOUTH AND THROAT WITH OR WITHOUT PRESENCE OF ULCERS  *URINARY PROBLEMS  *BOWEL PROBLEMS  UNUSUAL RASH Items with * indicate a potential emergency and should be followed up as soon as possible.  Feel free to call the clinic should you have any questions or concerns. The clinic phone number is [phone].  Please show the CHEMO ALERT CARD at check-in to the Emergency Department and triage nurse.   

## 2019-02-17 NOTE — Progress Notes (Signed)
No new issues reported by patient today. Proceed with treatment today per MD.   Treatment given per orders. Patient tolerated it well without problems. Vitals stable and discharged home from clinic ambulatory. Follow up as scheduled.

## 2019-02-18 ENCOUNTER — Other Ambulatory Visit: Payer: Self-pay

## 2019-02-18 ENCOUNTER — Inpatient Hospital Stay (HOSPITAL_COMMUNITY): Payer: Medicare PPO

## 2019-02-18 VITALS — BP 110/40 | HR 51 | Temp 97.1°F | Resp 18

## 2019-02-18 DIAGNOSIS — C3431 Malignant neoplasm of lower lobe, right bronchus or lung: Secondary | ICD-10-CM | POA: Diagnosis not present

## 2019-02-18 DIAGNOSIS — F17210 Nicotine dependence, cigarettes, uncomplicated: Secondary | ICD-10-CM | POA: Diagnosis not present

## 2019-02-18 DIAGNOSIS — I2510 Atherosclerotic heart disease of native coronary artery without angina pectoris: Secondary | ICD-10-CM | POA: Diagnosis not present

## 2019-02-18 DIAGNOSIS — C787 Secondary malignant neoplasm of liver and intrahepatic bile duct: Secondary | ICD-10-CM | POA: Diagnosis not present

## 2019-02-18 DIAGNOSIS — C3490 Malignant neoplasm of unspecified part of unspecified bronchus or lung: Secondary | ICD-10-CM

## 2019-02-18 DIAGNOSIS — D6481 Anemia due to antineoplastic chemotherapy: Secondary | ICD-10-CM | POA: Diagnosis not present

## 2019-02-18 DIAGNOSIS — C3432 Malignant neoplasm of lower lobe, left bronchus or lung: Secondary | ICD-10-CM | POA: Diagnosis not present

## 2019-02-18 DIAGNOSIS — I252 Old myocardial infarction: Secondary | ICD-10-CM | POA: Diagnosis not present

## 2019-02-18 DIAGNOSIS — Z5111 Encounter for antineoplastic chemotherapy: Secondary | ICD-10-CM | POA: Diagnosis not present

## 2019-02-18 DIAGNOSIS — I700 Atherosclerosis of aorta: Secondary | ICD-10-CM | POA: Diagnosis not present

## 2019-02-18 MED ORDER — SODIUM CHLORIDE 0.9 % IV SOLN
Freq: Once | INTRAVENOUS | Status: AC
  Administered 2019-02-18: 12:00:00 via INTRAVENOUS
  Filled 2019-02-18: qty 2

## 2019-02-18 MED ORDER — HEPARIN SOD (PORK) LOCK FLUSH 100 UNIT/ML IV SOLN
500.0000 [IU] | Freq: Once | INTRAVENOUS | Status: AC | PRN
  Administered 2019-02-18: 500 [IU]

## 2019-02-18 MED ORDER — PEGFILGRASTIM 6 MG/0.6ML ~~LOC~~ PSKT
6.0000 mg | PREFILLED_SYRINGE | Freq: Once | SUBCUTANEOUS | Status: AC
  Administered 2019-02-18: 12:00:00 6 mg via SUBCUTANEOUS

## 2019-02-18 MED ORDER — SODIUM CHLORIDE 0.9% FLUSH
10.0000 mL | INTRAVENOUS | Status: DC | PRN
  Administered 2019-02-18: 10 mL
  Filled 2019-02-18: qty 10

## 2019-02-18 MED ORDER — SODIUM CHLORIDE 0.9 % IV SOLN
Freq: Once | INTRAVENOUS | Status: AC
  Administered 2019-02-18: 11:00:00 via INTRAVENOUS

## 2019-02-18 MED ORDER — TOPOTECAN HCL CHEMO INJECTION 4 MG
1.2000 mg/m2 | Freq: Once | INTRAVENOUS | Status: AC
  Administered 2019-02-18: 2.2 mg via INTRAVENOUS
  Filled 2019-02-18: qty 2.2

## 2019-02-18 NOTE — Patient Instructions (Signed)
[facility] Discharge Instructions for Patients Receiving Chemotherapy  Today you received the following chemotherapy agents   To help prevent nausea and vomiting after your treatment, we encourage you to take your nausea medication   If you develop nausea and vomiting that is not controlled by your nausea medication, call the clinic.   BELOW ARE SYMPTOMS THAT SHOULD BE REPORTED IMMEDIATELY:  *FEVER GREATER THAN 100.5 F  *CHILLS WITH OR WITHOUT FEVER  NAUSEA AND VOMITING THAT IS NOT CONTROLLED WITH YOUR NAUSEA MEDICATION  *UNUSUAL SHORTNESS OF BREATH  *UNUSUAL BRUISING OR BLEEDING  TENDERNESS IN MOUTH AND THROAT WITH OR WITHOUT PRESENCE OF ULCERS  *URINARY PROBLEMS  *BOWEL PROBLEMS  UNUSUAL RASH Items with * indicate a potential emergency and should be followed up as soon as possible.  Feel free to call the clinic should you have any questions or concerns. The clinic phone number is [phone].  Please show the CHEMO ALERT CARD at check-in to the Emergency Department and triage nurse.   

## 2019-02-18 NOTE — Progress Notes (Signed)
Treatment given per orders. Patient tolerated it well without problems. Vitals stable and discharged home from clinic ambulatory. Follow up as scheduled.  

## 2019-02-24 ENCOUNTER — Other Ambulatory Visit: Payer: Self-pay | Admitting: Family Medicine

## 2019-02-24 DIAGNOSIS — F411 Generalized anxiety disorder: Secondary | ICD-10-CM

## 2019-02-24 DIAGNOSIS — F5101 Primary insomnia: Secondary | ICD-10-CM

## 2019-02-24 MED ORDER — ALPRAZOLAM 1 MG PO TABS: ORAL_TABLET | ORAL | 0 refills | Status: DC

## 2019-02-24 NOTE — Telephone Encounter (Signed)
Refill on xanax to [facility]

## 2019-02-24 NOTE — Telephone Encounter (Signed)
Pt is requesting refill on Xanax   LOV: 12/28/18  LRF:  01/27/19

## 2019-03-07 ENCOUNTER — Other Ambulatory Visit: Payer: Self-pay

## 2019-03-07 ENCOUNTER — Ambulatory Visit (HOSPITAL_COMMUNITY)
Admission: RE | Admit: 2019-03-07 | Discharge: 2019-03-07 | Disposition: A | Discharge status: Home / Self Care | Payer: Medicare PPO | Source: Ambulatory Visit | Attending: Hematology | Admitting: Hematology

## 2019-03-07 DIAGNOSIS — K5730 Diverticulosis of large intestine without perforation or abscess without bleeding: Secondary | ICD-10-CM | POA: Diagnosis not present

## 2019-03-07 DIAGNOSIS — N2889 Other specified disorders of kidney and ureter: Secondary | ICD-10-CM | POA: Diagnosis not present

## 2019-03-07 DIAGNOSIS — C3490 Malignant neoplasm of unspecified part of unspecified bronchus or lung: Secondary | ICD-10-CM | POA: Diagnosis not present

## 2019-03-07 DIAGNOSIS — R918 Other nonspecific abnormal finding of lung field: Secondary | ICD-10-CM | POA: Diagnosis not present

## 2019-03-07 DIAGNOSIS — Z85118 Personal history of other malignant neoplasm of bronchus and lung: Secondary | ICD-10-CM | POA: Diagnosis not present

## 2019-03-07 MED ORDER — IOHEXOL 300 MG/ML  SOLN
80.0000 mL | Freq: Once | INTRAMUSCULAR | Status: AC | PRN
  Administered 2019-03-07: 11:00:00 80 mL via INTRAVENOUS

## 2019-03-14 ENCOUNTER — Inpatient Hospital Stay (HOSPITAL_COMMUNITY): Payer: Medicare PPO

## 2019-03-14 ENCOUNTER — Inpatient Hospital Stay (HOSPITAL_BASED_OUTPATIENT_CLINIC_OR_DEPARTMENT_OTHER): Payer: Medicare PPO | Admitting: Hematology

## 2019-03-14 ENCOUNTER — Other Ambulatory Visit: Payer: Self-pay

## 2019-03-14 ENCOUNTER — Encounter (HOSPITAL_COMMUNITY): Payer: Self-pay | Admitting: Hematology

## 2019-03-14 VITALS — BP 114/48 | HR 50 | Temp 96.8°F | Resp 18

## 2019-03-14 DIAGNOSIS — C3431 Malignant neoplasm of lower lobe, right bronchus or lung: Secondary | ICD-10-CM | POA: Diagnosis not present

## 2019-03-14 DIAGNOSIS — I2510 Atherosclerotic heart disease of native coronary artery without angina pectoris: Secondary | ICD-10-CM | POA: Diagnosis not present

## 2019-03-14 DIAGNOSIS — C3490 Malignant neoplasm of unspecified part of unspecified bronchus or lung: Secondary | ICD-10-CM

## 2019-03-14 DIAGNOSIS — C787 Secondary malignant neoplasm of liver and intrahepatic bile duct: Secondary | ICD-10-CM | POA: Diagnosis not present

## 2019-03-14 DIAGNOSIS — D6481 Anemia due to antineoplastic chemotherapy: Secondary | ICD-10-CM | POA: Diagnosis not present

## 2019-03-14 DIAGNOSIS — I252 Old myocardial infarction: Secondary | ICD-10-CM | POA: Diagnosis not present

## 2019-03-14 DIAGNOSIS — C3432 Malignant neoplasm of lower lobe, left bronchus or lung: Secondary | ICD-10-CM | POA: Diagnosis not present

## 2019-03-14 DIAGNOSIS — F17210 Nicotine dependence, cigarettes, uncomplicated: Secondary | ICD-10-CM | POA: Diagnosis not present

## 2019-03-14 DIAGNOSIS — I700 Atherosclerosis of aorta: Secondary | ICD-10-CM | POA: Diagnosis not present

## 2019-03-14 DIAGNOSIS — Z5111 Encounter for antineoplastic chemotherapy: Secondary | ICD-10-CM | POA: Diagnosis not present

## 2019-03-14 LAB — CBC WITH DIFFERENTIAL/PLATELET
Abs Immature Granulocytes: 0.04 10*3/uL (ref 0.00–0.07)
Basophils Absolute: 0 10*3/uL (ref 0.0–0.1)
Basophils Relative: 1 %
Eosinophils Absolute: 0.1 10*3/uL (ref 0.0–0.5)
Eosinophils Relative: 2 %
HCT: 27.8 % — ABNORMAL LOW (ref 39.0–52.0)
Hemoglobin: 8.6 g/dL — ABNORMAL LOW (ref 13.0–17.0)
Immature Granulocytes: 1 %
Lymphocytes Relative: 15 %
Lymphs Abs: 1.2 10*3/uL (ref 0.7–4.0)
MCH: 31.5 pg (ref 26.0–34.0)
MCHC: 30.9 g/dL (ref 30.0–36.0)
MCV: 101.8 fL — ABNORMAL HIGH (ref 80.0–100.0)
Monocytes Absolute: 0.8 10*3/uL (ref 0.1–1.0)
Monocytes Relative: 10 %
Neutro Abs: 5.8 10*3/uL (ref 1.7–7.7)
Neutrophils Relative %: 71 %
Platelets: 350 10*3/uL (ref 150–400)
RBC: 2.73 MIL/uL — ABNORMAL LOW (ref 4.22–5.81)
RDW: 23.2 % — ABNORMAL HIGH (ref 11.5–15.5)
WBC: 8.1 10*3/uL (ref 4.0–10.5)
nRBC: 0 % (ref 0.0–0.2)

## 2019-03-14 LAB — COMPREHENSIVE METABOLIC PANEL
ALT: 15 U/L (ref 0–44)
AST: 18 U/L (ref 15–41)
Albumin: 3.9 g/dL (ref 3.5–5.0)
Alkaline Phosphatase: 66 U/L (ref 38–126)
Anion gap: 7 (ref 5–15)
BUN: 17 mg/dL (ref 8–23)
CO2: 22 mmol/L (ref 22–32)
Calcium: 8.4 mg/dL — ABNORMAL LOW (ref 8.9–10.3)
Chloride: 108 mmol/L (ref 98–111)
Creatinine, Ser: 0.92 mg/dL (ref 0.61–1.24)
GFR calc Af Amer: >60 mL/min (ref >60)
GFR calc non Af Amer: >60 mL/min (ref >60)
Glucose, Bld: 95 mg/dL (ref 70–99)
Potassium: 4.1 mmol/L (ref 3.5–5.1)
Sodium: 137 mmol/L (ref 135–145)
Total Bilirubin: 0.5 mg/dL (ref 0.3–1.2)
Total Protein: 6.6 g/dL (ref 6.5–8.1)

## 2019-03-14 MED ORDER — TOPOTECAN HCL CHEMO INJECTION 4 MG
1.2000 mg/m2 | Freq: Once | INTRAVENOUS | Status: AC
  Administered 2019-03-14: 12:00:00 2.2 mg via INTRAVENOUS
  Filled 2019-03-14: qty 2.2

## 2019-03-14 MED ORDER — SODIUM CHLORIDE 0.9 % IV SOLN
Freq: Once | INTRAVENOUS | Status: AC
  Administered 2019-03-14: 11:00:00 via INTRAVENOUS
  Filled 2019-03-14: qty 2

## 2019-03-14 MED ORDER — SODIUM CHLORIDE 0.9% FLUSH: 10.0000 mL | INTRAVENOUS | Status: DC | PRN

## 2019-03-14 MED ORDER — SODIUM CHLORIDE 0.9 % IV SOLN
Freq: Once | INTRAVENOUS | Status: AC
  Administered 2019-03-14: 11:00:00 via INTRAVENOUS

## 2019-03-14 MED ORDER — HEPARIN SOD (PORK) LOCK FLUSH 100 UNIT/ML IV SOLN: 500.0000 [IU] | Freq: Once | INTRAVENOUS | Status: DC | PRN

## 2019-03-14 NOTE — Assessment & Plan Note (Signed)
1.  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 - CAP on 03/07/2019 showed stable pulmonary nodules, right lower lobe nodule measuring 1.5 x 1.8 x 1.4 cm.  Left lower lobe nodule measures 1.6 x 0.8 cm.  No evidence of metastatic disease in the abdomen or pelvis. - He is tolerating Topotecan very well except some fatigue. -We reviewed his labs.  He will proceed with next cycle today. -She will come back in 4 weeks for follow-up.  2.  Chronic pain: -He is taking Percocet 1 and half tablets twice daily which is controlling pain.  3.  Peripheral neuropathy: -He is taking gabapentin 300 mg 3 times daily.  4.  Normocytic anemia: -This is chemotherapy-induced.  Last Feraheme infusion was on 10/19/2017. -Globin stable between 9 and 10.

## 2019-03-14 NOTE — Patient Instructions (Signed)
[place] at [facility] Discharge Instructions  You were seen today by Dr. Delton Coombes. He went over your recent lab and scan results. He will see you back in 4 weeks for labs and follow up.   Thank you for choosing [place] at [facility] to provide your oncology and hematology care.  To afford each patient quality time with our provider, please arrive at least 15 minutes before your scheduled appointment time.   If you have a lab appointment with the [facility] please come in thru the  Main Entrance and check in at the main information desk  You need to re-schedule your appointment should you arrive 10 or more minutes late.  We strive to give you quality time with our providers, and arriving late affects you and other patients whose appointments are after yours.  Also, if you no show three or more times for appointments you may be dismissed from the clinic at the providers discretion.     Again, thank you for choosing [facility].  Our hope is that these requests will decrease the amount of time that you wait before being seen by our physicians.       _____________________________________________________________  Should you have questions after your visit to [facility], please contact our office at [phone] between the hours of 8:00 a.m. and 4:30 p.m.  Voicemails left after 4:00 p.m. will not be returned until the following business day.  For prescription refill requests, have your pharmacy contact our office and allow 72 hours.    Cancer Center Support Programs:   > Cancer Support Group  2nd Tuesday of the month 1pm-2pm, Journey Room

## 2019-03-14 NOTE — Patient Instructions (Signed)
[facility] Discharge Instructions for Patients Receiving Chemotherapy  Today you received the following chemotherapy agents   To help prevent nausea and vomiting after your treatment, we encourage you to take your nausea medication   If you develop nausea and vomiting that is not controlled by your nausea medication, call the clinic.   BELOW ARE SYMPTOMS THAT SHOULD BE REPORTED IMMEDIATELY:  *FEVER GREATER THAN 100.5 F  *CHILLS WITH OR WITHOUT FEVER  NAUSEA AND VOMITING THAT IS NOT CONTROLLED WITH YOUR NAUSEA MEDICATION  *UNUSUAL SHORTNESS OF BREATH  *UNUSUAL BRUISING OR BLEEDING  TENDERNESS IN MOUTH AND THROAT WITH OR WITHOUT PRESENCE OF ULCERS  *URINARY PROBLEMS  *BOWEL PROBLEMS  UNUSUAL RASH Items with * indicate a potential emergency and should be followed up as soon as possible.  Feel free to call the clinic should you have any questions or concerns. The clinic phone number is [phone].  Please show the CHEMO ALERT CARD at check-in to the Emergency Department and triage nurse.   

## 2019-03-14 NOTE — Progress Notes (Signed)
[place], [place] [address]   CLINIC:  Medical Oncology/Hematology  PCP:  [name], MD [address] [place] [address] [phone]   REASON FOR VISIT:  Follow-up for SCLC with liver metastasis   BRIEF ONCOLOGIC HISTORY:  Oncology History  Extensive stage primary small cell carcinoma of lung ([place])  01/16/2017 Imaging   CT neck: IMPRESSION: 1. Bulky 5.4 cm right supraclavicular region malignant lymph node conglomeration with extracapsular extension. 2. Surrounding smaller abnormal right level 3 and level 5 lymph nodes, and the lymphadenopathy continues into the superior mediastinum, see Chest CT findings reported separately. 3. No other metastatic disease identified in the neck.   01/16/2017 Imaging   CT chest: IMPRESSION: 1. Extensive lymphadenopathy in the thorax and lower right cervical region, as discussed above. Primary differential considerations include lymphoma/leukemia or small cell carcinoma of the lung. Further evaluation a PET-CT could be considered to assess for additional sites of disease below the diaphragm if clinically appropriate. Additionally, ultrasound-guided biopsy of supraclavicular lymphadenopathy could be considered to establish a tissue diagnosis. 2. Indeterminate lesion in the periphery of segment 8 of the liver measuring 2.7 x 1.7 cm. Attention at time of follow-up PET-CT is recommended. 3. Aortic atherosclerosis, in addition to left main and 3 vessel coronary artery disease. Please note that although the presence of coronary artery calcium documents the presence of coronary artery disease, the severity of this disease and any potential stenosis cannot be assessed on this non-gated CT examination. Assessment for potential risk factor modification, dietary therapy or pharmacologic therapy may be warranted, if clinically indicated. 4. There are calcifications of the aortic valve.  Echocardiographic correlation for evaluation of potential valvular dysfunction may be warranted if clinically indicated. 5. Diffuse bronchial wall thickening with moderate centrilobular and paraseptal emphysema; imaging findings suggestive of underlying COPD.   02/03/2017 Initial Biopsy   (R) neck lymph node biopsy: SMALL CELL CARCINOMA (most likely lung primary).    02/03/2017 Miscellaneous   Port-a-cath attempted by IR; unable to place d/t enlarged SVC.    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 Staging for metastatic disease  EXAM: MRI HEAD WITHOUT AND WITH CONTRAST  TECHNIQUE: Multiplanar, multiecho pulse sequences of the brain and surrounding structures were obtained without and with intravenous contrast.  CONTRAST:  38m MULTIHANCE GADOBENATE DIMEGLUMINE 529 MG/ML IV SOLN  COMPARISON:  None.  FINDINGS: Brain: Negative for hydrocephalus. Cerebral volume normal for age. Small nonenhancing white matter hyperintensities consistent with mild chronic microvascular ischemia. No acute infarct. Negative for hemorrhage or mass or edema  Normal enhancement postcontrast infusion. No enhancing mass lesion. Leptomeningeal enhancement is normal.  Vascular: Normal arterial flow voids.  Normal venous enhancement  Skull and upper cervical spine: Negative  Sinuses/Orbits: Negative  Other: None  IMPRESSION: Negative for metastatic disease.  No acute abnormality.  Mild chronic white matter changes.   04/07/2017 Imaging    PET:  1. Marked reduction in size and metabolic activity of bulky RIGHT supraclavicular adenopathy mediastinal lymphadenopathy. 2. Residual moderate activity remains within small RIGHT supraclavicular lymph node, RIGHT lower paratracheal  lymph node and RIGHT hilar lymph node. 3. Resolution of prevascular and internal mammary mediastinal metastatic hypermetabolic activity. 4. Resolution of metabolic activity associated with solitary RIGHT  hepatic lobe liver metastasis. 5. No evidence of disease progression. 6. 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   05/27/2017 Imaging   MRI brain w/ and w/o contrast IMPRESSION: 1. No metastatic disease identified. 2. Increased nonspecific cerebral white matter signal changes since August. These are most commonly small vessel disease related. 3. New right maxillary sinusitis. Benign appearing retention cysts in the nasopharynx with trace mastoid effusions.   06/12/2017 Imaging   PET-CT IMPRESSION: 1. There are two new hypermetabolic nodules identified within both lower lobes measuring up to 3.1 cm. The appearance is nonspecific and may be inflammatory/infectious in etiology. Pulmonary metastatic disease cannot be excluded and short-term follow-up imaging in 3 months is advised to reassess these nodules. 2. Stable appearance of mild hypermetabolic activity associated with right paratracheal and right hilar lymph nodes. 3. Decrease in FDG uptake associated with index right supraclavicular lymph node. 4. 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 5. Aortic Atherosclerosis (ICD10-I70.0) and Emphysema (ICD10-J43.9).   08/24/2017 -  Chemotherapy   The patient had pegfilgrastim (NEULASTA ONPRO [place]) injection 6 mg, 6 mg, Subcutaneous, Once, 21 of 23 cycles Administration: 6 mg (08/28/2017), 6 mg (09/18/2017), 6 mg (10/09/2017), 6 mg (11/02/2017), 6 mg (11/20/2017), 6 mg (12/18/2017), 6 mg (01/15/2018), 6 mg (02/12/2018), 6 mg (03/12/2018), 6 mg (04/09/2018), 6 mg (05/07/2018), 6 mg (06/04/2018), 6 mg  (07/02/2018), 6 mg (07/30/2018), 6 mg (08/27/2018), 6 mg (09/24/2018), 6 mg (10/29/2018), 6 mg (11/26/2018), 6 mg (12/24/2018), 6 mg (01/21/2019), 6 mg (02/18/2019) topotecan (HYCAMTIN) 2.9 mg in sodium chloride 0.9 % 100 mL chemo infusion, 1.5 mg/m2 = 2.9 mg, Intravenous,  Once, 21 of 23 cycles Dose modification: 1.2 mg/m2 (80 % of original dose 1.5 mg/m2, Cycle 4, Reason: Dose Not Tolerated) Administration: 2.9 mg (08/24/2017), 2.9 mg (08/25/2017), 2.9 mg (08/28/2017), 2.9 mg (08/26/2017), 2.9 mg (08/27/2017), 2.9 mg (09/14/2017), 2.9 mg (09/15/2017), 2.9 mg (09/16/2017), 2.9 mg (09/17/2017), 2.9 mg (09/18/2017), 2.9 mg (10/05/2017), 2.9 mg (10/06/2017), 2.9 mg (10/07/2017), 2.9 mg (10/08/2017), 2.9 mg (10/09/2017), 2.3 mg (10/26/2017), 2.3 mg (10/27/2017), 2.3 mg (10/28/2017), 2.3 mg (10/29/2017), 2.3 mg (11/02/2017), 2.3 mg (11/16/2017), 2.3 mg (11/17/2017), 2.3 mg (11/18/2017), 2.3 mg (11/19/2017), 2.3 mg (11/20/2017), 2.3 mg (12/14/2017), 2.3 mg (12/15/2017), 2.3 mg (12/16/2017), 2.3 mg (12/17/2017), 2.3 mg (12/18/2017), 2.3 mg (01/11/2018), 2.3 mg (01/12/2018), 2.3 mg (01/13/2018), 2.3 mg (01/15/2018), 2.3 mg (02/08/2018), 2.3 mg (02/09/2018), 2.3 mg (02/10/2018), 2.3 mg (02/11/2018), 2.3 mg (02/12/2018), 2.3 mg (03/08/2018), 2.3 mg (03/09/2018), 2.3 mg (03/10/2018), 2.3 mg (03/11/2018), 2.3 mg (03/12/2018), 2.2 mg (04/05/2018), 2.2 mg (04/06/2018), 2.2 mg (04/07/2018), 2.2 mg (04/08/2018), 2.2 mg (04/09/2018), 2.2 mg (05/03/2018), 2.2 mg (05/04/2018), 2.2 mg (05/05/2018), 2.2 mg (05/06/2018), 2.2 mg (05/07/2018), 2.2 mg (05/31/2018), 2.2 mg (06/01/2018), 2.2 mg (06/02/2018), 2.2 mg (06/03/2018), 2.2 mg (06/04/2018), 2.2 mg (06/28/2018), 2.2 mg (06/29/2018), 2.2 mg (06/30/2018), 2.2 mg (07/01/2018), 2.2 mg (07/02/2018), 2.2 mg (07/26/2018), 2.2 mg (07/27/2018), 2.2 mg (07/28/2018), 2.2 mg (07/29/2018), 2.2 mg (07/30/2018), 2.2 mg (08/23/2018), 2.2 mg (08/24/2018), 2.2 mg (08/25/2018), 2.2 mg (08/26/2018), 2.2 mg (08/27/2018), 2.2 mg (09/20/2018), 2.2 mg (09/21/2018), 2.2 mg (09/22/2018), 2.2 mg  (09/23/2018), 2.2 mg (09/24/2018), 2.2 mg (10/25/2018), 2.2 mg (10/26/2018), 2.2 mg (10/27/2018), 2.2 mg (10/28/2018), 2.2 mg (10/29/2018), 2.2 mg (11/22/2018), 2.2 mg (11/23/2018), 2.2 mg (11/24/2018), 2.2 mg (11/25/2018), 2.2 mg (11/26/2018), 2.2 mg (12/20/2018), 2.2 mg (12/21/2018),  2.2 mg (12/22/2018), 2.2 mg (12/23/2018), 2.2 mg (12/24/2018), 2.2 mg (01/17/2019), 2.2 mg (01/18/2019), 2.2 mg (01/19/2019), 2.2 mg (01/20/2019), 2.2 mg (01/21/2019), 2.2 mg (02/14/2019), 2.2 mg (02/15/2019), 2.2 mg (02/16/2019), 2.2 mg (02/17/2019), 2.2 mg (02/18/2019) ondansetron (ZOFRAN) 4 mg in sodium chloride 0.9 % 50 mL IVPB, , Intravenous,  Once, 16 of 18 cycles Administration:  (02/08/2018),  (02/09/2018),  (02/10/2018),  (02/11/2018),  (02/12/2018),  (03/08/2018),  (03/09/2018),  (03/10/2018),  (03/11/2018),  (03/12/2018),  (04/05/2018),  (04/06/2018),  (04/07/2018),  (04/08/2018),  (04/09/2018),  (05/03/2018),  (05/04/2018),  (05/05/2018),  (05/06/2018),  (05/07/2018),  (05/31/2018),  (06/01/2018),  (06/02/2018),  (06/03/2018),  (06/04/2018),  (06/28/2018),  (06/29/2018),  (06/30/2018),  (07/01/2018),  (07/02/2018),  (07/26/2018),  (07/27/2018),  (07/28/2018),  (07/29/2018),  (07/30/2018),  (08/23/2018),  (08/24/2018),  (08/25/2018),  (08/26/2018),  (08/27/2018),  (09/20/2018),  (09/21/2018),  (09/22/2018),  (09/23/2018),  (09/24/2018),  (10/25/2018),  (10/26/2018),  (10/27/2018),  (10/28/2018),  (10/29/2018),  (11/22/2018),  (11/23/2018),  (11/24/2018),  (11/25/2018),  (11/26/2018),  (12/20/2018),  (12/21/2018),  (12/22/2018),  (12/23/2018),  (12/24/2018),  (01/17/2019),  (01/18/2019),  (01/19/2019),  (01/20/2019),  (01/21/2019),  (02/14/2019),  (02/15/2019),  (02/16/2019),  (02/17/2019),  (02/18/2019)  for chemotherapy treatment.         INTERVAL HISTORY:  [name] 68 y.o. male seen for follow-up of small cell lung cancer.  He had CT scans done on 03/07/2019.  Last cycle of chemotherapy was 4 weeks ago.  He had experienced some fatigue.  Appetite is 50%.  Energy levels are 75%.  Constipation has been stable.   Numbness in the feet has been well controlled with gabapentin.  No fevers or infections were reported.  Denied any nausea, vomiting or diarrhea.  Denies any bleeding per rectum or melena.   REVIEW OF SYSTEMS:  Review of Systems  Constitutional: Positive for fatigue.  Gastrointestinal: Positive for constipation.  Musculoskeletal: Negative for back pain.  Neurological: Positive for numbness.  All other systems reviewed and are negative.    PAST MEDICAL/SURGICAL HISTORY:  Past Medical History:  Diagnosis Date   Anxiety    CAD (coronary artery disease)    Cancer (HCC)    stage 4 small cell lung cancer    COPD (chronic obstructive pulmonary disease) (HCC)    Depression    Dyspnea    increased exertion   Feeling of chest tightness    Heart palpitations    History of chemotherapy    Myocardial infarction ([place])    Osteopenia    Panic attacks    Smoker    Past Surgical History:  Procedure Laterality Date   BACK SURGERY  12/24/2000   L5,S1   CORONARY STENT PLACEMENT  2005   RCA & CX   HERNIA REPAIR Right 1980's   INGUINAL HERNIA REPAIR  12/1978   right side   IR FLUORO GUIDE PORT INSERTION RIGHT  04/02/2017   IR US GUIDE BX ASP/DRAIN  02/03/2017   IR US GUIDE VASC ACCESS RIGHT  04/02/2017   NM MYOCAR PERF WALL MOTION  09/07/2009   No ischemia; EF 51%   SHOULDER SURGERY Left 08/2010   SPINE SURGERY  2002   L5-S1     SOCIAL HISTORY:  Social History   Socioeconomic History   Marital status: Single    Spouse name: Not on file   Number of children: Not on file   Years of education: Not on file   Highest education level: Not on file  Occupational History   Not on file  Social Needs  Financial resource strain: Not on file   Food insecurity    Worry: Not on file    Inability: Not on file   Transportation needs    Medical: Not on file    Non-medical: Not on file  Tobacco Use   Smoking status: Current Every Day Smoker    Packs/day: 1.00      Types: Cigarettes   Smokeless tobacco: Never Used  Substance and Sexual Activity   Alcohol use: Yes    Comment: occas   Drug use: No   Sexual activity: Not on file  Lifestyle   Physical activity    Days per week: Not on file    Minutes per session: Not on file   Stress: Not on file  Relationships   Social connections    Talks on phone: Not on file    Gets together: Not on file    Attends religious service: Not on file    Active member of club or organization: Not on file    Attends meetings of clubs or organizations: Not on file    Relationship status: Not on file   Intimate partner violence    Fear of current or ex partner: Not on file    Emotionally abused: Not on file    Physically abused: Not on file    Forced sexual activity: Not on file  Other Topics Concern   Not on file  Social History Narrative   Not on file    FAMILY HISTORY:  Family History  Problem Relation Age of Onset   Heart attack Father    Kidney disease Father        renal failure   Heart failure Mother    Heart attack Mother    Cancer Brother    Diabetes Brother    Alcohol abuse Brother    Diabetes Sister     CURRENT MEDICATIONS:  Outpatient Encounter Medications as of 03/14/2019  Medication Sig   albuterol (VENTOLIN HFA) 108 (90 Base) MCG/ACT inhaler Inhale 2 puffs into the lungs every 6 (six) hours as needed for wheezing or shortness of breath.   ALPRAZolam (XANAX) 1 MG tablet TAKE (1) TABLET BY MOUTH (4) TIMES DAILY.   aspirin EC 81 MG tablet Take 81 mg by mouth daily.   atorvastatin (LIPITOR) 40 MG tablet TAKE (1) TABLET BY MOUTH ONCE DAILY.   gabapentin (NEURONTIN) 300 MG capsule TAKE (1) CAPSULE BY MOUTH TWICE DAILY. (Patient taking differently: 300 mg 3 (three) times daily. )   hydrOXYzine (ATARAX/VISTARIL) 50 MG tablet TAKE ONE TABLET BY MOUTH AT BEDTIME AS NEEDED FOR SLEEP.   ondansetron (ZOFRAN) 8 MG tablet Take 1 tablet (8 mg total) by mouth 2 (two) times  daily as needed. Start on the third day after cisplatin chemotherapy.   oxyCODONE-acetaminophen (PERCOCET/ROXICET) 5-325 MG tablet 1 and half tablet twice daily as needed.   Pegfilgrastim (NEULASTA ONPRO [place]) Inject into the skin. Every 21 days   polyethylene glycol powder (GLYCOLAX/MIRALAX) powder Take 17 g by mouth daily.   prochlorperazine (COMPAZINE) 10 MG tablet Take 1 tablet (10 mg total) by mouth every 6 (six) hours as needed (Nausea or vomiting).   topotecan in sodium chloride 0.9 % 100 mL Inject into the vein once. Days 1-5 every 21 days   traMADol (ULTRAM) 50 MG tablet TAKE (1) TABLET BY MOUTH (3) TIMES DAILY.   [DISCONTINUED] TOPOTECAN HCL IV Inject into the vein.   clotrimazole-betamethasone (LOTRISONE) cream Apply 1 application topically 2 (two) times daily. (Patient  not taking: Reported on 03/14/2019)   cyclobenzaprine (FLEXERIL) 5 MG tablet TAKE 1 TABLET THREE TIMES DAILY AS NEEDED FOR MUSCLE SPASMS. (Patient not taking: Reported on 03/14/2019)   lidocaine-prilocaine (EMLA) cream Apply to affected area once (Patient not taking: Reported on 03/14/2019)   [DISCONTINUED] gabapentin (NEURONTIN) 300 MG capsule Take 1 capsule (300 mg total) by mouth 2 (two) times daily.   No facility-administered encounter medications on file as of 03/14/2019.     ALLERGIES:  Allergies  Allergen Reactions   Codeine Nausea Only   Niaspan [Niacin Er]      PHYSICAL EXAM:  ECOG Performance status: 1  Vitals:   03/14/19 0916  BP: (!) 115/53  Pulse: 62  Resp: 18  Temp: (!) 97.1 F (36.2 C)  SpO2: 97%   Filed Weights   03/14/19 0916  Weight: 148 lb 12.8 oz (67.5 kg)    Physical Exam Vitals signs reviewed.  Constitutional:      Appearance: Normal appearance.  Cardiovascular:     Rate and Rhythm: Normal rate and regular rhythm.     Heart sounds: Normal heart sounds.  Pulmonary:     Effort: Pulmonary effort is normal.     Breath sounds: Normal breath sounds.  Abdominal:      General: There is no distension.     Palpations: Abdomen is soft. There is no mass.  Musculoskeletal:        General: No swelling.  Skin:    General: Skin is warm.  Neurological:     General: No focal deficit present.     Mental Status: He is alert and oriented to person, place, and time.  Psychiatric:        Mood and Affect: Mood normal.        Behavior: Behavior normal.      LABORATORY DATA:  I have reviewed the labs as listed.  CBC    Component Value Date/Time   WBC 8.1 03/14/2019 0913   RBC 2.73 (L) 03/14/2019 0913   HGB 8.6 (L) 03/14/2019 0913   HCT 27.8 (L) 03/14/2019 0913   PLT 350 03/14/2019 0913   MCV 101.8 (H) 03/14/2019 0913   MCH 31.5 03/14/2019 0913   MCHC 30.9 03/14/2019 0913   RDW 23.2 (H) 03/14/2019 0913   LYMPHSABS 1.2 03/14/2019 0913   MONOABS 0.8 03/14/2019 0913   EOSABS 0.1 03/14/2019 0913   BASOSABS 0.0 03/14/2019 0913   CMP Latest Ref Rng & Units 03/14/2019 02/14/2019 01/17/2019  Glucose 70 - 99 mg/dL 95 98 97  BUN 8 - 23 mg/dL '17 17 21  '$ Creatinine 0.61 - 1.24 mg/dL 0.92 0.96 0.86  Sodium 135 - 145 mmol/L 137 138 137  Potassium 3.5 - 5.1 mmol/L 4.1 4.0 4.0  Chloride 98 - 111 mmol/L 108 105 106  CO2 22 - 32 mmol/L '22 24 25  '$ Calcium 8.9 - 10.3 mg/dL 8.4(L) 8.9 8.9  Total Protein 6.5 - 8.1 g/dL 6.6 6.7 6.5  Total Bilirubin 0.3 - 1.2 mg/dL 0.5 0.5 0.3  Alkaline Phos 38 - 126 U/L 66 55 60  AST 15 - 41 U/L '18 15 16  '$ ALT 0 - 44 U/L '15 11 14    '$ I have reviewed his scans independently and discussed with the patient.   ASSESSMENT & PLAN:   No problem-specific Assessment & Plan notes found for this encounter.  Total time spent is 25 minutes with more than 50% of the time spent face-to-face discussing treatment plan and coordination of care.  Orders placed this encounter:  No orders of the defined types were placed in this encounter.     Derek Jack, MD [place] [phone]

## 2019-03-14 NOTE — Progress Notes (Signed)
Pt presents today for f/u appointment with Dr. Delton Coombes and treatment. VS within parameters for treatment. MAR reviewed. Message received to proceed with treatment. Labs reviewed.   Treatment given today per MD orders. Tolerated infusion without adverse affects. Vital signs stable. No complaints at this time. Discharged from clinic ambulatory. F/U with [facility] as scheduled.

## 2019-03-15 ENCOUNTER — Other Ambulatory Visit: Payer: Self-pay

## 2019-03-15 ENCOUNTER — Inpatient Hospital Stay (HOSPITAL_COMMUNITY): Payer: Medicare PPO | Attending: Hematology

## 2019-03-15 ENCOUNTER — Other Ambulatory Visit: Payer: Self-pay | Admitting: Family Medicine

## 2019-03-15 VITALS — BP 113/44 | HR 52 | Temp 96.8°F | Resp 18 | Wt 150.2 lb

## 2019-03-15 DIAGNOSIS — D6481 Anemia due to antineoplastic chemotherapy: Secondary | ICD-10-CM | POA: Insufficient documentation

## 2019-03-15 DIAGNOSIS — C3431 Malignant neoplasm of lower lobe, right bronchus or lung: Secondary | ICD-10-CM | POA: Diagnosis not present

## 2019-03-15 DIAGNOSIS — C787 Secondary malignant neoplasm of liver and intrahepatic bile duct: Secondary | ICD-10-CM | POA: Diagnosis not present

## 2019-03-15 DIAGNOSIS — C3490 Malignant neoplasm of unspecified part of unspecified bronchus or lung: Secondary | ICD-10-CM

## 2019-03-15 DIAGNOSIS — G629 Polyneuropathy, unspecified: Secondary | ICD-10-CM | POA: Insufficient documentation

## 2019-03-15 DIAGNOSIS — Z5111 Encounter for antineoplastic chemotherapy: Secondary | ICD-10-CM | POA: Insufficient documentation

## 2019-03-15 DIAGNOSIS — F17210 Nicotine dependence, cigarettes, uncomplicated: Secondary | ICD-10-CM | POA: Insufficient documentation

## 2019-03-15 DIAGNOSIS — C3432 Malignant neoplasm of lower lobe, left bronchus or lung: Secondary | ICD-10-CM | POA: Insufficient documentation

## 2019-03-15 DIAGNOSIS — Z23 Encounter for immunization: Secondary | ICD-10-CM | POA: Insufficient documentation

## 2019-03-15 MED ORDER — TOPOTECAN HCL CHEMO INJECTION 4 MG
1.2000 mg/m2 | Freq: Once | INTRAVENOUS | Status: AC
  Administered 2019-03-15: 14:00:00 2.2 mg via INTRAVENOUS
  Filled 2019-03-15: qty 2.2

## 2019-03-15 MED ORDER — SODIUM CHLORIDE 0.9 % IV SOLN
Freq: Once | INTRAVENOUS | Status: AC
  Administered 2019-03-15: 13:00:00 via INTRAVENOUS

## 2019-03-15 MED ORDER — SODIUM CHLORIDE 0.9% FLUSH
10.0000 mL | INTRAVENOUS | Status: DC | PRN
  Administered 2019-03-15: 10 mL
  Filled 2019-03-15: qty 10

## 2019-03-15 MED ORDER — SODIUM CHLORIDE 0.9 % IV SOLN
Freq: Once | INTRAVENOUS | Status: AC
  Administered 2019-03-15: 13:00:00 via INTRAVENOUS
  Filled 2019-03-15: qty 2

## 2019-03-15 MED ORDER — SODIUM CHLORIDE 0.9 % IV SOLN: Freq: Once | INTRAVENOUS | Status: DC

## 2019-03-15 MED ORDER — HEPARIN SOD (PORK) LOCK FLUSH 100 UNIT/ML IV SOLN
500.0000 [IU] | Freq: Once | INTRAVENOUS | Status: AC | PRN
  Administered 2019-03-15: 500 [IU]

## 2019-03-15 NOTE — Patient Instructions (Signed)
[facility] Discharge Instructions for Patients Receiving Chemotherapy  Today you received the following chemotherapy agents   To help prevent nausea and vomiting after your treatment, we encourage you to take your nausea medication   If you develop nausea and vomiting that is not controlled by your nausea medication, call the clinic.   BELOW ARE SYMPTOMS THAT SHOULD BE REPORTED IMMEDIATELY:  *FEVER GREATER THAN 100.5 F  *CHILLS WITH OR WITHOUT FEVER  NAUSEA AND VOMITING THAT IS NOT CONTROLLED WITH YOUR NAUSEA MEDICATION  *UNUSUAL SHORTNESS OF BREATH  *UNUSUAL BRUISING OR BLEEDING  TENDERNESS IN MOUTH AND THROAT WITH OR WITHOUT PRESENCE OF ULCERS  *URINARY PROBLEMS  *BOWEL PROBLEMS  UNUSUAL RASH Items with * indicate a potential emergency and should be followed up as soon as possible.  Feel free to call the clinic should you have any questions or concerns. The clinic phone number is [phone].  Please show the CHEMO ALERT CARD at check-in to the Emergency Department and triage nurse.   

## 2019-03-15 NOTE — Progress Notes (Signed)
Pt presents today for Topotecan Day 2. VS within parameters for tx. MAR reviewed. Pt has no complaints of any changes since the last visit.   Treatment given today per MD orders. Tolerated infusion without adverse affects. Vital signs stable. No complaints at this time. Discharged from clinic ambulatory. F/U with [facility] as scheduled.

## 2019-03-16 ENCOUNTER — Inpatient Hospital Stay (HOSPITAL_COMMUNITY): Payer: Medicare PPO

## 2019-03-16 ENCOUNTER — Encounter (HOSPITAL_COMMUNITY): Payer: Self-pay

## 2019-03-16 VITALS — BP 121/38 | HR 50 | Temp 97.8°F | Resp 18

## 2019-03-16 DIAGNOSIS — C3490 Malignant neoplasm of unspecified part of unspecified bronchus or lung: Secondary | ICD-10-CM

## 2019-03-16 DIAGNOSIS — Z23 Encounter for immunization: Secondary | ICD-10-CM | POA: Diagnosis not present

## 2019-03-16 DIAGNOSIS — G629 Polyneuropathy, unspecified: Secondary | ICD-10-CM | POA: Diagnosis not present

## 2019-03-16 DIAGNOSIS — D6481 Anemia due to antineoplastic chemotherapy: Secondary | ICD-10-CM | POA: Diagnosis not present

## 2019-03-16 DIAGNOSIS — C787 Secondary malignant neoplasm of liver and intrahepatic bile duct: Secondary | ICD-10-CM | POA: Diagnosis not present

## 2019-03-16 DIAGNOSIS — C3432 Malignant neoplasm of lower lobe, left bronchus or lung: Secondary | ICD-10-CM | POA: Diagnosis not present

## 2019-03-16 DIAGNOSIS — Z5111 Encounter for antineoplastic chemotherapy: Secondary | ICD-10-CM | POA: Diagnosis not present

## 2019-03-16 DIAGNOSIS — C3431 Malignant neoplasm of lower lobe, right bronchus or lung: Secondary | ICD-10-CM | POA: Diagnosis not present

## 2019-03-16 DIAGNOSIS — F17210 Nicotine dependence, cigarettes, uncomplicated: Secondary | ICD-10-CM | POA: Diagnosis not present

## 2019-03-16 MED ORDER — TOPOTECAN HCL CHEMO INJECTION 4 MG
1.2000 mg/m2 | Freq: Once | INTRAVENOUS | Status: AC
  Administered 2019-03-16: 15:00:00 2.2 mg via INTRAVENOUS
  Filled 2019-03-16: qty 2.2

## 2019-03-16 MED ORDER — SODIUM CHLORIDE 0.9 % IV SOLN
Freq: Once | INTRAVENOUS | Status: AC
  Administered 2019-03-16: 13:00:00 via INTRAVENOUS

## 2019-03-16 MED ORDER — SODIUM CHLORIDE 0.9 % IV SOLN
Freq: Once | INTRAVENOUS | Status: AC
  Administered 2019-03-16: 14:00:00 via INTRAVENOUS
  Filled 2019-03-16: qty 2

## 2019-03-16 MED ORDER — SODIUM CHLORIDE 0.9% FLUSH
10.0000 mL | INTRAVENOUS | Status: DC | PRN
  Administered 2019-03-16: 10 mL
  Filled 2019-03-16: qty 10

## 2019-03-16 MED ORDER — HEPARIN SOD (PORK) LOCK FLUSH 100 UNIT/ML IV SOLN
500.0000 [IU] | Freq: Once | INTRAVENOUS | Status: AC | PRN
  Administered 2019-03-16: 15:00:00 500 [IU]

## 2019-03-16 NOTE — Progress Notes (Signed)
Harvie Junior tolerated Topotecan infusion well without complaints or incident. VSS upon discharge. Port left accessed and flushed for use tomorrow. Pt discharged self ambulatory in satisfactory ocndition

## 2019-03-16 NOTE — Patient Instructions (Signed)
[facility] Discharge Instructions for Patients Receiving Chemotherapy   Beginning January 23rd 2017 lab work for the [facility] will be done in the  Main lab at [facility] on 1st floor. If you have a lab appointment with the [facility] please come in thru the  Main Entrance and check in at the main information desk   Today you received the following chemotherapy agents Topotecan. Follow-up as scheduled. Call clinic for any questions or concerns  To help prevent nausea and vomiting after your treatment, we encourage you to take your nausea medication   If you develop nausea and vomiting, or diarrhea that is not controlled by your medication, call the clinic.  The clinic phone number is [phone]. Office hours are Monday-Friday 8:30am-5:00pm.  BELOW ARE SYMPTOMS THAT SHOULD BE REPORTED IMMEDIATELY:  *FEVER GREATER THAN 101.0 F  *CHILLS WITH OR WITHOUT FEVER  NAUSEA AND VOMITING THAT IS NOT CONTROLLED WITH YOUR NAUSEA MEDICATION  *UNUSUAL SHORTNESS OF BREATH  *UNUSUAL BRUISING OR BLEEDING  TENDERNESS IN MOUTH AND THROAT WITH OR WITHOUT PRESENCE OF ULCERS  *URINARY PROBLEMS  *BOWEL PROBLEMS  UNUSUAL RASH Items with * indicate a potential emergency and should be followed up as soon as possible. If you have an emergency after office hours please contact your primary care physician or go to the nearest emergency department.  Please call the clinic during office hours if you have any questions or concerns.   You may also contact the Patient Navigator at [phone] should you have any questions or need assistance in obtaining follow up care.      Resources For Cancer Patients and their Caregivers ? American [employer] Society: Can assist with transportation, wigs, general needs, runs Look Good Feel Better.        262 663 6895 ? Cancer Care: Provides financial assistance, online support groups, medication/co-pay assistance.  1-800-813-HOPE  980-112-5084) ? [facility] Assists [place] cancer patients and their families through emotional , educational and financial support.  272-137-2810 ? [place] DSS Where to apply for food stamps, Medicaid and utility assistance. (785)443-8350 ? [facility]: Transportation to medical appointments. 830-844-4279 ? Social Security Administration: May apply for disability if have a Stage IV cancer. 774-133-9019 (310)005-8240 ? [employer], Disability and Transit Services: Assists with nutrition, care and transit needs. 3615357654

## 2019-03-17 ENCOUNTER — Inpatient Hospital Stay (HOSPITAL_COMMUNITY): Payer: Medicare PPO

## 2019-03-17 ENCOUNTER — Other Ambulatory Visit: Payer: Self-pay

## 2019-03-17 VITALS — BP 117/42 | HR 51 | Temp 97.3°F | Resp 18

## 2019-03-17 DIAGNOSIS — C787 Secondary malignant neoplasm of liver and intrahepatic bile duct: Secondary | ICD-10-CM | POA: Diagnosis not present

## 2019-03-17 DIAGNOSIS — C3432 Malignant neoplasm of lower lobe, left bronchus or lung: Secondary | ICD-10-CM | POA: Diagnosis not present

## 2019-03-17 DIAGNOSIS — G629 Polyneuropathy, unspecified: Secondary | ICD-10-CM | POA: Diagnosis not present

## 2019-03-17 DIAGNOSIS — C3431 Malignant neoplasm of lower lobe, right bronchus or lung: Secondary | ICD-10-CM | POA: Diagnosis not present

## 2019-03-17 DIAGNOSIS — Z23 Encounter for immunization: Secondary | ICD-10-CM | POA: Diagnosis not present

## 2019-03-17 DIAGNOSIS — Z5111 Encounter for antineoplastic chemotherapy: Secondary | ICD-10-CM | POA: Diagnosis not present

## 2019-03-17 DIAGNOSIS — F17210 Nicotine dependence, cigarettes, uncomplicated: Secondary | ICD-10-CM | POA: Diagnosis not present

## 2019-03-17 DIAGNOSIS — C3490 Malignant neoplasm of unspecified part of unspecified bronchus or lung: Secondary | ICD-10-CM

## 2019-03-17 DIAGNOSIS — D6481 Anemia due to antineoplastic chemotherapy: Secondary | ICD-10-CM | POA: Diagnosis not present

## 2019-03-17 MED ORDER — SODIUM CHLORIDE 0.9% FLUSH
10.0000 mL | INTRAVENOUS | Status: DC | PRN
  Administered 2019-03-17: 10 mL
  Filled 2019-03-17: qty 10

## 2019-03-17 MED ORDER — SODIUM CHLORIDE 0.9 % IV SOLN
Freq: Once | INTRAVENOUS | Status: AC
  Administered 2019-03-17: 13:00:00 via INTRAVENOUS

## 2019-03-17 MED ORDER — SODIUM CHLORIDE 0.9 % IV SOLN
Freq: Once | INTRAVENOUS | Status: AC
  Administered 2019-03-17: 13:00:00 via INTRAVENOUS
  Filled 2019-03-17: qty 2

## 2019-03-17 MED ORDER — TOPOTECAN HCL CHEMO INJECTION 4 MG
1.2000 mg/m2 | Freq: Once | INTRAVENOUS | Status: AC
  Administered 2019-03-17: 14:00:00 2.2 mg via INTRAVENOUS
  Filled 2019-03-17: qty 2.2

## 2019-03-17 MED ORDER — HEPARIN SOD (PORK) LOCK FLUSH 100 UNIT/ML IV SOLN
500.0000 [IU] | Freq: Once | INTRAVENOUS | Status: AC | PRN
  Administered 2019-03-17: 15:00:00 500 [IU]

## 2019-03-17 NOTE — Progress Notes (Signed)
Treatment given per orders. Patient tolerated it well without problems. Vitals stable and discharged home from clinic ambulatory. Follow up as scheduled.  

## 2019-03-17 NOTE — Patient Instructions (Signed)
[facility] Discharge Instructions for Patients Receiving Chemotherapy  Today you received the following chemotherapy agents   To help prevent nausea and vomiting after your treatment, we encourage you to take your nausea medication   If you develop nausea and vomiting that is not controlled by your nausea medication, call the clinic.   BELOW ARE SYMPTOMS THAT SHOULD BE REPORTED IMMEDIATELY:  *FEVER GREATER THAN 100.5 F  *CHILLS WITH OR WITHOUT FEVER  NAUSEA AND VOMITING THAT IS NOT CONTROLLED WITH YOUR NAUSEA MEDICATION  *UNUSUAL SHORTNESS OF BREATH  *UNUSUAL BRUISING OR BLEEDING  TENDERNESS IN MOUTH AND THROAT WITH OR WITHOUT PRESENCE OF ULCERS  *URINARY PROBLEMS  *BOWEL PROBLEMS  UNUSUAL RASH Items with * indicate a potential emergency and should be followed up as soon as possible.  Feel free to call the clinic should you have any questions or concerns. The clinic phone number is [phone].  Please show the CHEMO ALERT CARD at check-in to the Emergency Department and triage nurse.   

## 2019-03-18 ENCOUNTER — Inpatient Hospital Stay (HOSPITAL_COMMUNITY): Payer: Medicare PPO

## 2019-03-18 ENCOUNTER — Other Ambulatory Visit: Payer: Self-pay

## 2019-03-18 VITALS — BP 123/47 | HR 50 | Temp 98.2°F | Resp 16

## 2019-03-18 DIAGNOSIS — C3432 Malignant neoplasm of lower lobe, left bronchus or lung: Secondary | ICD-10-CM | POA: Diagnosis not present

## 2019-03-18 DIAGNOSIS — Z23 Encounter for immunization: Secondary | ICD-10-CM | POA: Diagnosis not present

## 2019-03-18 DIAGNOSIS — D6481 Anemia due to antineoplastic chemotherapy: Secondary | ICD-10-CM | POA: Diagnosis not present

## 2019-03-18 DIAGNOSIS — C3490 Malignant neoplasm of unspecified part of unspecified bronchus or lung: Secondary | ICD-10-CM

## 2019-03-18 DIAGNOSIS — C3431 Malignant neoplasm of lower lobe, right bronchus or lung: Secondary | ICD-10-CM | POA: Diagnosis not present

## 2019-03-18 DIAGNOSIS — G629 Polyneuropathy, unspecified: Secondary | ICD-10-CM | POA: Diagnosis not present

## 2019-03-18 DIAGNOSIS — C787 Secondary malignant neoplasm of liver and intrahepatic bile duct: Secondary | ICD-10-CM | POA: Diagnosis not present

## 2019-03-18 DIAGNOSIS — F17210 Nicotine dependence, cigarettes, uncomplicated: Secondary | ICD-10-CM | POA: Diagnosis not present

## 2019-03-18 DIAGNOSIS — Z5111 Encounter for antineoplastic chemotherapy: Secondary | ICD-10-CM | POA: Diagnosis not present

## 2019-03-18 MED ORDER — HEPARIN SOD (PORK) LOCK FLUSH 100 UNIT/ML IV SOLN
500.0000 [IU] | Freq: Once | INTRAVENOUS | Status: AC | PRN
  Administered 2019-03-18: 12:00:00 500 [IU]

## 2019-03-18 MED ORDER — SODIUM CHLORIDE 0.9 % IV SOLN
Freq: Once | INTRAVENOUS | Status: AC
  Administered 2019-03-18: 11:00:00 via INTRAVENOUS

## 2019-03-18 MED ORDER — PEGFILGRASTIM 6 MG/0.6ML ~~LOC~~ PSKT
PREFILLED_SYRINGE | SUBCUTANEOUS | Status: AC
  Filled 2019-03-18: qty 0.6

## 2019-03-18 MED ORDER — SODIUM CHLORIDE 0.9 % IV SOLN
Freq: Once | INTRAVENOUS | Status: AC
  Administered 2019-03-18: 11:00:00 via INTRAVENOUS
  Filled 2019-03-18: qty 2

## 2019-03-18 MED ORDER — PEGFILGRASTIM 6 MG/0.6ML ~~LOC~~ PSKT
6.0000 mg | PREFILLED_SYRINGE | Freq: Once | SUBCUTANEOUS | Status: AC
  Administered 2019-03-18: 12:00:00 6 mg via SUBCUTANEOUS

## 2019-03-18 MED ORDER — SODIUM CHLORIDE 0.9% FLUSH
10.0000 mL | INTRAVENOUS | Status: DC | PRN
  Administered 2019-03-18: 10 mL
  Filled 2019-03-18: qty 10

## 2019-03-18 MED ORDER — TOPOTECAN HCL CHEMO INJECTION 4 MG
1.2000 mg/m2 | Freq: Once | INTRAVENOUS | Status: AC
  Administered 2019-03-18: 12:00:00 2.2 mg via INTRAVENOUS
  Filled 2019-03-18: qty 2.2

## 2019-03-18 NOTE — Progress Notes (Signed)
Treatment given per orders. Patient tolerated it well without problems. Vitals stable and discharged home from clinic ambulatory. Follow up as scheduled.  

## 2019-03-18 NOTE — Patient Instructions (Signed)
[facility] Discharge Instructions for Patients Receiving Chemotherapy  Today you received the following chemotherapy agents   To help prevent nausea and vomiting after your treatment, we encourage you to take your nausea medication   If you develop nausea and vomiting that is not controlled by your nausea medication, call the clinic.   BELOW ARE SYMPTOMS THAT SHOULD BE REPORTED IMMEDIATELY:  *FEVER GREATER THAN 100.5 F  *CHILLS WITH OR WITHOUT FEVER  NAUSEA AND VOMITING THAT IS NOT CONTROLLED WITH YOUR NAUSEA MEDICATION  *UNUSUAL SHORTNESS OF BREATH  *UNUSUAL BRUISING OR BLEEDING  TENDERNESS IN MOUTH AND THROAT WITH OR WITHOUT PRESENCE OF ULCERS  *URINARY PROBLEMS  *BOWEL PROBLEMS  UNUSUAL RASH Items with * indicate a potential emergency and should be followed up as soon as possible.  Feel free to call the clinic should you have any questions or concerns. The clinic phone number is [phone].  Please show the CHEMO ALERT CARD at check-in to the Emergency Department and triage nurse.   

## 2019-03-28 ENCOUNTER — Other Ambulatory Visit: Payer: Self-pay | Admitting: Family Medicine

## 2019-03-28 DIAGNOSIS — F5101 Primary insomnia: Secondary | ICD-10-CM

## 2019-03-28 DIAGNOSIS — F411 Generalized anxiety disorder: Secondary | ICD-10-CM

## 2019-03-28 NOTE — Telephone Encounter (Signed)
Requested Prescriptions   Pending Prescriptions Disp Refills  . ALPRAZolam (XANAX) 1 MG tablet [Pharmacy Med Name: ALPRAZOLAM 1 MG TABLET] 120 tablet 0    Sig: TAKE (1) TABLET BY MOUTH (4) TIMES DAILY.     Last OV 12/28/2018  Last written 02/24/2019

## 2019-04-11 ENCOUNTER — Inpatient Hospital Stay (HOSPITAL_COMMUNITY): Payer: Medicare PPO

## 2019-04-11 ENCOUNTER — Inpatient Hospital Stay (HOSPITAL_BASED_OUTPATIENT_CLINIC_OR_DEPARTMENT_OTHER): Payer: Medicare PPO | Admitting: Hematology

## 2019-04-11 ENCOUNTER — Other Ambulatory Visit: Payer: Self-pay

## 2019-04-11 ENCOUNTER — Encounter (HOSPITAL_COMMUNITY): Payer: Self-pay | Admitting: Hematology

## 2019-04-11 VITALS — BP 112/53 | HR 58 | Temp 97.5°F | Resp 18

## 2019-04-11 DIAGNOSIS — C3431 Malignant neoplasm of lower lobe, right bronchus or lung: Secondary | ICD-10-CM | POA: Diagnosis not present

## 2019-04-11 DIAGNOSIS — C3490 Malignant neoplasm of unspecified part of unspecified bronchus or lung: Secondary | ICD-10-CM

## 2019-04-11 DIAGNOSIS — Z23 Encounter for immunization: Secondary | ICD-10-CM | POA: Diagnosis not present

## 2019-04-11 DIAGNOSIS — T451X5A Adverse effect of antineoplastic and immunosuppressive drugs, initial encounter: Secondary | ICD-10-CM | POA: Diagnosis not present

## 2019-04-11 DIAGNOSIS — C3432 Malignant neoplasm of lower lobe, left bronchus or lung: Secondary | ICD-10-CM | POA: Diagnosis not present

## 2019-04-11 DIAGNOSIS — G620 Drug-induced polyneuropathy: Secondary | ICD-10-CM | POA: Diagnosis not present

## 2019-04-11 DIAGNOSIS — G629 Polyneuropathy, unspecified: Secondary | ICD-10-CM | POA: Diagnosis not present

## 2019-04-11 DIAGNOSIS — F17210 Nicotine dependence, cigarettes, uncomplicated: Secondary | ICD-10-CM | POA: Diagnosis not present

## 2019-04-11 DIAGNOSIS — Z5111 Encounter for antineoplastic chemotherapy: Secondary | ICD-10-CM | POA: Diagnosis not present

## 2019-04-11 DIAGNOSIS — C787 Secondary malignant neoplasm of liver and intrahepatic bile duct: Secondary | ICD-10-CM | POA: Diagnosis not present

## 2019-04-11 DIAGNOSIS — D6481 Anemia due to antineoplastic chemotherapy: Secondary | ICD-10-CM | POA: Diagnosis not present

## 2019-04-11 LAB — COMPREHENSIVE METABOLIC PANEL
ALT: 12 U/L (ref 0–44)
AST: 13 U/L — ABNORMAL LOW (ref 15–41)
Albumin: 3.7 g/dL (ref 3.5–5.0)
Alkaline Phosphatase: 54 U/L (ref 38–126)
BUN: 15 mg/dL (ref 8–23)
CO2: 29 mmol/L (ref 22–32)
Calcium: 8.5 mg/dL — ABNORMAL LOW (ref 8.9–10.3)
Chloride: 105 mmol/L (ref 98–111)
Creatinine, Ser: 0.84 mg/dL (ref 0.61–1.24)
GFR calc Af Amer: >60 mL/min (ref >60)
GFR calc non Af Amer: >60 mL/min (ref >60)
Glucose, Bld: 102 mg/dL — ABNORMAL HIGH (ref 70–99)
Potassium: 4.1 mmol/L (ref 3.5–5.1)
Sodium: 136 mmol/L (ref 135–145)
Total Bilirubin: 0.2 mg/dL — ABNORMAL LOW (ref 0.3–1.2)
Total Protein: 6.1 g/dL — ABNORMAL LOW (ref 6.5–8.1)

## 2019-04-11 LAB — CBC WITH DIFFERENTIAL/PLATELET
Abs Immature Granulocytes: 0.04 10*3/uL (ref 0.00–0.07)
Basophils Absolute: 0 10*3/uL (ref 0.0–0.1)
Basophils Relative: 1 %
Eosinophils Absolute: 0.1 10*3/uL (ref 0.0–0.5)
Eosinophils Relative: 1 %
HCT: 26 % — ABNORMAL LOW (ref 39.0–52.0)
Hemoglobin: 8.2 g/dL — ABNORMAL LOW (ref 13.0–17.0)
Immature Granulocytes: 1 %
Lymphocytes Relative: 15 %
Lymphs Abs: 1.2 10*3/uL (ref 0.7–4.0)
MCH: 31.9 pg (ref 26.0–34.0)
MCHC: 31.5 g/dL (ref 30.0–36.0)
MCV: 101.2 fL — ABNORMAL HIGH (ref 80.0–100.0)
Monocytes Absolute: 0.9 10*3/uL (ref 0.1–1.0)
Monocytes Relative: 12 %
Neutro Abs: 5.8 10*3/uL (ref 1.7–7.7)
Neutrophils Relative %: 70 %
Platelets: 337 10*3/uL (ref 150–400)
RBC: 2.57 MIL/uL — ABNORMAL LOW (ref 4.22–5.81)
RDW: 23.5 % — ABNORMAL HIGH (ref 11.5–15.5)
WBC: 8 10*3/uL (ref 4.0–10.5)
nRBC: 0 % (ref 0.0–0.2)

## 2019-04-11 MED ORDER — GABAPENTIN 300 MG PO CAPS: 600.0000 mg | ORAL_CAPSULE | Freq: Three times a day (TID) | ORAL | 3 refills | Status: DC

## 2019-04-11 MED ORDER — TOPOTECAN HCL CHEMO INJECTION 4 MG
1.2000 mg/m2 | Freq: Once | INTRAVENOUS | Status: AC
  Administered 2019-04-11: 2.2 mg via INTRAVENOUS
  Filled 2019-04-11: qty 2.2

## 2019-04-11 MED ORDER — INFLUENZA VAC A&B SA ADJ QUAD 0.5 ML IM PRSY
0.5000 mL | PREFILLED_SYRINGE | Freq: Once | INTRAMUSCULAR | Status: AC
  Administered 2019-04-11: 0.5 mL via INTRAMUSCULAR
  Filled 2019-04-11: qty 0.5

## 2019-04-11 MED ORDER — SODIUM CHLORIDE 0.9 % IV SOLN
Freq: Once | INTRAVENOUS | Status: AC
  Administered 2019-04-11: 11:00:00 via INTRAVENOUS
  Filled 2019-04-11: qty 2

## 2019-04-11 MED ORDER — SODIUM CHLORIDE 0.9% FLUSH
10.0000 mL | INTRAVENOUS | Status: DC | PRN
  Administered 2019-04-11: 10 mL
  Filled 2019-04-11: qty 10

## 2019-04-11 MED ORDER — HEPARIN SOD (PORK) LOCK FLUSH 100 UNIT/ML IV SOLN
500.0000 [IU] | Freq: Once | INTRAVENOUS | Status: AC | PRN
  Administered 2019-04-11: 500 [IU]

## 2019-04-11 MED ORDER — SODIUM CHLORIDE 0.9 % IV SOLN
Freq: Once | INTRAVENOUS | Status: AC
  Administered 2019-04-11: 11:00:00 via INTRAVENOUS

## 2019-04-11 NOTE — Progress Notes (Signed)
1025 Labs reviewed with and pt seen by Dr. Delton Coombes and pt approved for Topotecan infusion today per MD            [name] tolerated Topotecan infusion and Influenza injection well without complaints or incident.Port left accessed,saline locked and flushed for use tomorrow. VSS upon discharge. Pt discharged self ambulatory in satisfactory condition

## 2019-04-11 NOTE — Progress Notes (Signed)
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 Bulky 5.4 cm right supraclavicular region malignant lymph node conglomeration with extracapsular extension. 2. Surrounding smaller abnormal right level 3 and level 5 lymph nodes, and the lymphadenopathy continues into the superior mediastinum, see Chest CT findings reported separately. 3. No other metastatic disease identified in the neck.   01/16/2017 Imaging   CT chest: IMPRESSION: 1. Extensive lymphadenopathy in the thorax and lower right cervical region, as discussed above. Primary differential considerations include lymphoma/leukemia or small cell carcinoma of the lung. Further evaluation a PET-CT could be considered to assess for additional sites of disease below the diaphragm if clinically appropriate. Additionally, ultrasound-guided biopsy of supraclavicular lymphadenopathy could be considered to establish a tissue diagnosis. 2. Indeterminate lesion in the periphery of segment 8 of the liver measuring 2.7 x 1.7 cm. Attention at time of follow-up PET-CT is recommended. 3. Aortic atherosclerosis, in addition to left main and 3 vessel coronary artery disease. Please note that although the presence of coronary artery calcium documents the presence of coronary artery disease, the severity of this disease and any potential stenosis cannot be assessed on this non-gated CT examination. Assessment for potential risk factor modification, dietary therapy or pharmacologic therapy may be warranted, if clinically indicated. 4. There are calcifications of the aortic valve.  Echocardiographic correlation for evaluation of potential valvular dysfunction may be warranted if clinically indicated. 5. Diffuse bronchial wall thickening with moderate centrilobular and paraseptal emphysema; imaging findings suggestive of underlying COPD.   02/03/2017 Initial Biopsy   (R) neck lymph node biopsy: SMALL CELL CARCINOMA (most likely lung primary).    02/03/2017 Miscellaneous   Port-a-cath attempted by IR; unable to place d/t enlarged SVC.    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 Staging for metastatic disease  EXAM: MRI HEAD WITHOUT AND WITH CONTRAST  TECHNIQUE: Multiplanar, multiecho pulse sequences of the brain and surrounding structures were obtained without and with intravenous contrast.  CONTRAST:  38m MULTIHANCE GADOBENATE DIMEGLUMINE 529 MG/ML IV SOLN  COMPARISON:  None.  FINDINGS: Brain: Negative for hydrocephalus. Cerebral volume normal for age. Small nonenhancing white matter hyperintensities consistent with mild chronic microvascular ischemia. No acute infarct. Negative for hemorrhage or mass or edema  Normal enhancement postcontrast infusion. No enhancing mass lesion. Leptomeningeal enhancement is normal.  Vascular: Normal arterial flow voids.  Normal venous enhancement  Skull and upper cervical spine: Negative  Sinuses/Orbits: Negative  Other: None  IMPRESSION: Negative for metastatic disease.  No acute abnormality.  Mild chronic white matter changes.   04/07/2017 Imaging    PET:  1. Marked reduction in size and metabolic activity of bulky RIGHT supraclavicular adenopathy mediastinal lymphadenopathy. 2. Residual moderate activity remains within small RIGHT supraclavicular lymph node, RIGHT lower paratracheal  lymph node and RIGHT hilar lymph node. 3. Resolution of prevascular and internal mammary mediastinal metastatic hypermetabolic activity. 4. Resolution of metabolic activity associated with solitary RIGHT  hepatic lobe liver metastasis. 5. No evidence of disease progression. 6. 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   05/27/2017 Imaging   MRI brain w/ and w/o contrast IMPRESSION: 1. No metastatic disease identified. 2. Increased nonspecific cerebral white matter signal changes since August. These are most commonly small vessel disease related. 3. New right maxillary sinusitis. Benign appearing retention cysts in the nasopharynx with trace mastoid effusions.   06/12/2017 Imaging   PET-CT IMPRESSION: 1. There are two new hypermetabolic nodules identified within both lower lobes measuring up to 3.1 cm. The appearance is nonspecific and may be inflammatory/infectious in etiology. Pulmonary metastatic disease cannot be excluded and short-term follow-up imaging in 3 months is advised to reassess these nodules. 2. Stable appearance of mild hypermetabolic activity associated with right paratracheal and right hilar lymph nodes. 3. Decrease in FDG uptake associated with index right supraclavicular lymph node. 4. 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 5. Aortic Atherosclerosis (ICD10-I70.0) and Emphysema (ICD10-J43.9).   08/24/2017 -  Chemotherapy   The patient had pegfilgrastim (NEULASTA ONPRO [place]) injection 6 mg, 6 mg, Subcutaneous, Once, 23 of 23 cycles Administration: 6 mg (08/28/2017), 6 mg (09/18/2017), 6 mg (10/09/2017), 6 mg (11/02/2017), 6 mg (11/20/2017), 6 mg (12/18/2017), 6 mg (01/15/2018), 6 mg (02/12/2018), 6 mg (03/12/2018), 6 mg (04/09/2018), 6 mg (05/07/2018), 6 mg (06/04/2018), 6 mg  (07/02/2018), 6 mg (07/30/2018), 6 mg (08/27/2018), 6 mg (09/24/2018), 6 mg (10/29/2018), 6 mg (11/26/2018), 6 mg (12/24/2018), 6 mg (01/21/2019), 6 mg (02/18/2019), 6 mg (03/18/2019) topotecan (HYCAMTIN) 2.9 mg in sodium chloride 0.9 % 100 mL chemo infusion, 1.5 mg/m2 = 2.9 mg, Intravenous,  Once, 23 of 23 cycles Dose modification: 1.2 mg/m2 (80 % of original dose 1.5 mg/m2, Cycle 4, Reason: Dose Not Tolerated) Administration: 2.9 mg (08/24/2017), 2.9 mg (08/25/2017), 2.9 mg (08/28/2017), 2.9 mg (08/26/2017), 2.9 mg (08/27/2017), 2.9 mg (09/14/2017), 2.9 mg (09/15/2017), 2.9 mg (09/16/2017), 2.9 mg (09/17/2017), 2.9 mg (09/18/2017), 2.9 mg (10/05/2017), 2.9 mg (10/06/2017), 2.9 mg (10/07/2017), 2.9 mg (10/08/2017), 2.9 mg (10/09/2017), 2.3 mg (10/26/2017), 2.3 mg (10/27/2017), 2.3 mg (10/28/2017), 2.3 mg (10/29/2017), 2.3 mg (11/02/2017), 2.3 mg (11/16/2017), 2.3 mg (11/17/2017), 2.3 mg (11/18/2017), 2.3 mg (11/19/2017), 2.3 mg (11/20/2017), 2.3 mg (12/14/2017), 2.3 mg (12/15/2017), 2.3 mg (12/16/2017), 2.3 mg (12/17/2017), 2.3 mg (12/18/2017), 2.3 mg (01/11/2018), 2.3 mg (01/12/2018), 2.3 mg (01/13/2018), 2.3 mg (01/15/2018), 2.3 mg (02/08/2018), 2.3 mg (02/09/2018), 2.3 mg (02/10/2018), 2.3 mg (02/11/2018), 2.3 mg (02/12/2018), 2.3 mg (03/08/2018), 2.3 mg (03/09/2018), 2.3 mg (03/10/2018), 2.3 mg (03/11/2018), 2.3 mg (03/12/2018), 2.2 mg (04/05/2018), 2.2 mg (04/06/2018), 2.2 mg (04/07/2018), 2.2 mg (04/08/2018), 2.2 mg (04/09/2018), 2.2 mg (05/03/2018), 2.2 mg (05/04/2018), 2.2 mg (05/05/2018), 2.2 mg (05/06/2018), 2.2 mg (05/07/2018), 2.2 mg (05/31/2018), 2.2 mg (06/01/2018), 2.2 mg (06/02/2018), 2.2 mg (06/03/2018), 2.2 mg (06/04/2018), 2.2 mg (06/28/2018), 2.2 mg (06/29/2018), 2.2 mg (06/30/2018), 2.2 mg (07/01/2018), 2.2 mg (07/02/2018), 2.2 mg (07/26/2018), 2.2 mg (07/27/2018), 2.2 mg (07/28/2018), 2.2 mg (07/29/2018), 2.2 mg (07/30/2018), 2.2 mg (08/23/2018), 2.2 mg (08/24/2018), 2.2 mg (08/25/2018), 2.2 mg (08/26/2018), 2.2 mg (08/27/2018), 2.2 mg (09/20/2018), 2.2 mg (09/21/2018), 2.2 mg  (09/22/2018), 2.2 mg (09/23/2018), 2.2 mg (09/24/2018), 2.2 mg (10/25/2018), 2.2 mg (10/26/2018), 2.2 mg (10/27/2018), 2.2 mg (10/28/2018), 2.2 mg (10/29/2018), 2.2 mg (11/22/2018), 2.2 mg (11/23/2018), 2.2 mg (11/24/2018), 2.2 mg (11/25/2018), 2.2 mg (11/26/2018), 2.2 mg (12/20/2018),  2.2 mg (12/21/2018), 2.2 mg (12/22/2018), 2.2 mg (12/23/2018), 2.2 mg (12/24/2018), 2.2 mg (01/17/2019), 2.2 mg (01/18/2019), 2.2 mg (01/19/2019), 2.2 mg (01/20/2019), 2.2 mg (01/21/2019), 2.2 mg (02/14/2019), 2.2 mg (02/15/2019), 2.2 mg (02/16/2019), 2.2 mg (02/17/2019), 2.2 mg (02/18/2019), 2.2 mg (03/14/2019), 2.2 mg (03/15/2019), 2.2 mg (03/16/2019), 2.2 mg (03/17/2019), 2.2 mg (03/18/2019), 2.2 mg (04/11/2019) ondansetron (ZOFRAN) 4 mg in sodium chloride 0.9 % 50 mL IVPB, , Intravenous,  Once, 18 of 18 cycles Administration:  (02/08/2018),  (02/09/2018),  (02/10/2018),  (02/11/2018),  (02/12/2018),  (03/08/2018),  (03/09/2018),  (03/10/2018),  (03/11/2018),  (03/12/2018),  (04/05/2018),  (04/06/2018),  (04/07/2018),  (04/08/2018),  (04/09/2018),  (05/03/2018),  (05/04/2018),  (05/05/2018),  (05/06/2018),  (05/07/2018),  (05/31/2018),  (06/01/2018),  (06/02/2018),  (06/03/2018),  (06/04/2018),  (06/28/2018),  (06/29/2018),  (06/30/2018),  (07/01/2018),  (07/02/2018),  (07/26/2018),  (07/27/2018),  (07/28/2018),  (07/29/2018),  (07/30/2018),  (08/23/2018),  (08/24/2018),  (08/25/2018),  (08/26/2018),  (08/27/2018),  (09/20/2018),  (09/21/2018),  (09/22/2018),  (09/23/2018),  (09/24/2018),  (10/25/2018),  (10/26/2018),  (10/27/2018),  (10/28/2018),  (10/29/2018),  (11/22/2018),  (11/23/2018),  (11/24/2018),  (11/25/2018),  (11/26/2018),  (12/20/2018),  (12/21/2018),  (12/22/2018),  (12/23/2018),  (12/24/2018),  (01/17/2019),  (01/18/2019),  (01/19/2019),  (01/20/2019),  (01/21/2019),  (02/14/2019),  (02/15/2019),  (02/16/2019),  (02/17/2019),  (02/18/2019),  (03/14/2019),  (03/15/2019),  (03/16/2019),  (03/17/2019),  (03/18/2019),  (04/11/2019)  for chemotherapy treatment.         INTERVAL HISTORY:  Mr. Finken 68 y.o. male seen for follow-up of  small cell lung cancer and next cycle of chemotherapy.  He reports worsening of his neuropathy in the feet.  Appetite is 25%.  Energy levels are 50%.  Chronic cough is stable.  Restless leg symptoms are slightly worse.  Denies any nausea or vomiting or diarrhea.  No headaches or vision changes reported.  REVIEW OF SYSTEMS:  Review of Systems  Constitutional: Negative for fatigue.  Musculoskeletal: Negative for back pain.  Neurological: Positive for numbness.  All other systems reviewed and are negative.    PAST MEDICAL/SURGICAL HISTORY:  Past Medical History:  Diagnosis Date   Anxiety    CAD (coronary artery disease)    Cancer (HCC)    stage 4 small cell lung cancer    COPD (chronic obstructive pulmonary disease) (HCC)    Depression    Dyspnea    increased exertion   Feeling of chest tightness    Heart palpitations    History of chemotherapy    Myocardial infarction ([place])    Osteopenia    Panic attacks    Smoker    Past Surgical History:  Procedure Laterality Date   BACK SURGERY  12/24/2000   L5,S1   CORONARY STENT PLACEMENT  2005   RCA & CX   HERNIA REPAIR Right 1980's   INGUINAL HERNIA REPAIR  12/1978   right side   IR FLUORO GUIDE PORT INSERTION RIGHT  04/02/2017   IR US GUIDE BX ASP/DRAIN  02/03/2017   IR US GUIDE VASC ACCESS RIGHT  04/02/2017   NM MYOCAR PERF WALL MOTION  09/07/2009   No ischemia; EF 51%   SHOULDER SURGERY Left 08/2010   SPINE SURGERY  2002   L5-S1     SOCIAL HISTORY:  Social History   Socioeconomic History   Marital status: Single    Spouse name: Not on file   Number of children: Not on file   Years of education: Not on file   Highest education level: Not on file  Occupational History  Not on file  Social Needs   Financial resource strain: Not on file   Food insecurity    Worry: Not on file    Inability: Not on file   Transportation needs    Medical: Not on file    Non-medical: Not on file  Tobacco  Use   Smoking status: Current Every Day Smoker    Packs/day: 1.00    Types: Cigarettes   Smokeless tobacco: Never Used  Substance and Sexual Activity   Alcohol use: Yes    Comment: occas   Drug use: No   Sexual activity: Not on file  Lifestyle   Physical activity    Days per week: Not on file    Minutes per session: Not on file   Stress: Not on file  Relationships   Social connections    Talks on phone: Not on file    Gets together: Not on file    Attends religious service: Not on file    Active member of club or organization: Not on file    Attends meetings of clubs or organizations: Not on file    Relationship status: Not on file   Intimate partner violence    Fear of current or ex partner: Not on file    Emotionally abused: Not on file    Physically abused: Not on file    Forced sexual activity: Not on file  Other Topics Concern   Not on file  Social History Narrative   Not on file    FAMILY HISTORY:  Family History  Problem Relation Age of Onset   Heart attack Father    Kidney disease Father        renal failure   Heart failure Mother    Heart attack Mother    Cancer Brother    Diabetes Brother    Alcohol abuse Brother    Diabetes Sister     CURRENT MEDICATIONS:  Outpatient Encounter Medications as of 04/11/2019  Medication Sig   albuterol (VENTOLIN HFA) 108 (90 Base) MCG/ACT inhaler Inhale 2 puffs into the lungs every 6 (six) hours as needed for wheezing or shortness of breath.   ALPRAZolam (XANAX) 1 MG tablet TAKE (1) TABLET BY MOUTH (4) TIMES DAILY.   aspirin EC 81 MG tablet Take 81 mg by mouth daily.   atorvastatin (LIPITOR) 40 MG tablet TAKE (1) TABLET BY MOUTH ONCE DAILY.   cholecalciferol (VITAMIN D3) 25 MCG (1000 UT) tablet Take 1,000 Units by mouth daily.   gabapentin (NEURONTIN) 300 MG capsule Take 2 capsules (600 mg total) by mouth 3 (three) times daily.   hydrOXYzine (ATARAX/VISTARIL) 50 MG tablet TAKE ONE TABLET BY  MOUTH AT BEDTIME AS NEEDED FOR SLEEP.   oxyCODONE-acetaminophen (PERCOCET/ROXICET) 5-325 MG tablet 1 and half tablet twice daily as needed.   Pegfilgrastim (NEULASTA ONPRO [place]) Inject into the skin. Every 21 days   topotecan in sodium chloride 0.9 % 100 mL Inject into the vein once. Days 1-5 every 21 days   vitamin B-12 (CYANOCOBALAMIN) 100 MCG tablet Take 100 mcg by mouth daily.   [DISCONTINUED] gabapentin (NEURONTIN) 300 MG capsule TAKE (1) CAPSULE BY MOUTH TWICE DAILY. (Patient taking differently: 300 mg 3 (three) times daily. )   [DISCONTINUED] lidocaine-prilocaine (EMLA) cream Apply to affected area once   clotrimazole-betamethasone (LOTRISONE) cream Apply 1 application topically 2 (two) times daily. (Patient not taking: Reported on 03/14/2019)   ondansetron (ZOFRAN) 8 MG tablet Take 1 tablet (8 mg total) by mouth 2 (two) times  daily as needed. Start on the third day after cisplatin chemotherapy. (Patient not taking: Reported on 04/11/2019)   polyethylene glycol powder (GLYCOLAX/MIRALAX) powder Take 17 g by mouth daily. (Patient not taking: Reported on 04/11/2019)   prochlorperazine (COMPAZINE) 10 MG tablet Take 1 tablet (10 mg total) by mouth every 6 (six) hours as needed (Nausea or vomiting). (Patient not taking: Reported on 04/11/2019)   traMADol (ULTRAM) 50 MG tablet TAKE (1) TABLET BY MOUTH (3) TIMES DAILY. (Patient not taking: Reported on 04/11/2019)   [DISCONTINUED] cyclobenzaprine (FLEXERIL) 5 MG tablet TAKE 1 TABLET THREE TIMES DAILY AS NEEDED FOR MUSCLE SPASMS. (Patient not taking: Reported on 03/14/2019)   No facility-administered encounter medications on file as of 04/11/2019.     ALLERGIES:  Allergies  Allergen Reactions   Codeine Nausea Only   Niaspan [Niacin Er]      PHYSICAL EXAM:  ECOG Performance status: 1  Vitals:   04/11/19 0933  BP: (!) 103/50  Pulse: (!) 56  Resp: 18  Temp: (!) 97.1 F (36.2 C)  SpO2: 98%   Filed Weights   04/11/19 0933  Weight:  149 lb 12.8 oz (67.9 kg)    Physical Exam Vitals signs reviewed.  Constitutional:      Appearance: Normal appearance.  Cardiovascular:     Rate and Rhythm: Normal rate and regular rhythm.     Heart sounds: Normal heart sounds.  Pulmonary:     Effort: Pulmonary effort is normal.     Breath sounds: Normal breath sounds.  Abdominal:     General: There is no distension.     Palpations: Abdomen is soft. There is no mass.  Musculoskeletal:        General: No swelling.  Skin:    General: Skin is warm.  Neurological:     General: No focal deficit present.     Mental Status: He is alert and oriented to person, place, and time.  Psychiatric:        Mood and Affect: Mood normal.        Behavior: Behavior normal.      LABORATORY DATA:  I have reviewed the labs as listed.  CBC    Component Value Date/Time   WBC 8.0 04/11/2019 0942   RBC 2.57 (L) 04/11/2019 0942   HGB 8.2 (L) 04/11/2019 0942   HCT 26.0 (L) 04/11/2019 0942   PLT 337 04/11/2019 0942   MCV 101.2 (H) 04/11/2019 0942   MCH 31.9 04/11/2019 0942   MCHC 31.5 04/11/2019 0942   RDW 23.5 (H) 04/11/2019 0942   LYMPHSABS 1.2 04/11/2019 0942   MONOABS 0.9 04/11/2019 0942   EOSABS 0.1 04/11/2019 0942   BASOSABS 0.0 04/11/2019 0942   CMP Latest Ref Rng & Units 04/11/2019 03/14/2019 02/14/2019  Glucose 70 - 99 mg/dL 102(H) 95 98  BUN 8 - 23 mg/dL _0 Creatinine 0.61 - 1.24 mg/dL 0.84 0.92 0.96  Sodium 135 - 145 mmol/L 136 137 138  Potassium 3.5 - 5.1 mmol/L 4.1 4.1 4.0  Chloride 98 - 111 mmol/L 105 108 105  CO2 22 - 32 mmol/L _1 Calcium 8.9 - 10.3 mg/dL 8.5(L) 8.4(L) 8.9  Total Protein 6.5 - 8.1 g/dL 6.1(L) 6.6 6.7  Total Bilirubin 0.3 - 1.2 mg/dL 0.2(L) 0.5 0.5  Alkaline Phos 38 - 126 U/L 54 66 55  AST 15 - 41 U/L 13(L) 18 15  ALT 0 - 44 U/L _2 I have reviewed his scans  independently and discussed with the patient.   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 -CT CAP on 03/07/2019 showed stable pulmonary nodules, right lower lobe nodule measuring 1.5 x 1.8 x 1.4 cm.  Left lower lobe nodule measures 1.6 x 0.8 cm.  No evidence of metastatic disease in the abdomen or pelvis. -He is tolerating chemotherapy very well.  We reviewed his labs.  Hemoglobin is slightly down. -He will proceed with his next cycle today.  He will come back in 4 weeks for follow-up.  2.  Chronic pain: - He will continue Percocet 1 and half tablets twice daily which is controlling pain.  3.  Peripheral neuropathy: - He has increasing neuropathy.  He is currently on gabapentin 300 mg 3 times daily. -I will increase it to 600 mg 3 times daily.  4.  Normocytic anemia: -This is chemotherapy-induced.  Last Feraheme was on 10/19/2017. -Hemoglobin today is 8.2.  I will check ferritin, iron panel, B30 and folic acid at next visit.  Total time spent is 25 minutes with more than 50% of the time spent face-to-face discussing treatment plan and coordination of care.    Orders placed this encounter:  Orders Placed This Encounter  Procedures   CBC with Differential/Platelet   Comprehensive metabolic panel   Iron and TIBC   Ferritin   Vitamin B12   Folate      Derek Jack, MD [place] [phone]

## 2019-04-11 NOTE — Assessment & Plan Note (Signed)
1.  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 -CT CAP on 03/07/2019 showed stable pulmonary nodules, right lower lobe nodule measuring 1.5 x 1.8 x 1.4 cm.  Left lower lobe nodule measures 1.6 x 0.8 cm.  No evidence of metastatic disease in the abdomen or pelvis. -He is tolerating chemotherapy very well.  We reviewed his labs.  Hemoglobin is slightly down. -He will proceed with his next cycle today.  He will come back in 4 weeks for follow-up.  2.  Chronic pain: - He will continue Percocet 1 and half tablets twice daily which is controlling pain.  3.  Peripheral neuropathy: - He has increasing neuropathy.  He is currently on gabapentin 300 mg 3 times daily. -I will increase it to 600 mg 3 times daily.  4.  Normocytic anemia: -This is chemotherapy-induced.  Last Feraheme was on 10/19/2017. -Hemoglobin today is 8.2.  I will check ferritin, iron panel, G54 and folic acid at next visit.

## 2019-04-11 NOTE — Patient Instructions (Signed)
[facility] Discharge Instructions for Patients Receiving Chemotherapy   Beginning January 23rd 2017 lab work for the [facility] will be done in the  Main lab at [facility] on 1st floor. If you have a lab appointment with the [facility] please come in thru the  Main Entrance and check in at the main information desk   Today you received the following chemotherapy agents Topotecan. Follow-up as scheduled. Call clinic for any questions or concerns  To help prevent nausea and vomiting after your treatment, we encourage you to take your nausea medication   If you develop nausea and vomiting, or diarrhea that is not controlled by your medication, call the clinic.  The clinic phone number is [phone]. Office hours are Monday-Friday 8:30am-5:00pm.  BELOW ARE SYMPTOMS THAT SHOULD BE REPORTED IMMEDIATELY:  *FEVER GREATER THAN 101.0 F  *CHILLS WITH OR WITHOUT FEVER  NAUSEA AND VOMITING THAT IS NOT CONTROLLED WITH YOUR NAUSEA MEDICATION  *UNUSUAL SHORTNESS OF BREATH  *UNUSUAL BRUISING OR BLEEDING  TENDERNESS IN MOUTH AND THROAT WITH OR WITHOUT PRESENCE OF ULCERS  *URINARY PROBLEMS  *BOWEL PROBLEMS  UNUSUAL RASH Items with * indicate a potential emergency and should be followed up as soon as possible. If you have an emergency after office hours please contact your primary care physician or go to the nearest emergency department.  Please call the clinic during office hours if you have any questions or concerns.   You may also contact the Patient Navigator at [phone] should you have any questions or need assistance in obtaining follow up care.      Resources For Cancer Patients and their Caregivers ? American Cancer Society: Can assist with transportation, wigs, general needs, runs Look Good Feel Better.        680-647-8386 ? Cancer Care: Provides financial assistance, online support groups, medication/co-pay assistance.  1-800-813-HOPE  (661) 389-7406) ? [employer] Assists [place] cancer patients and their families through emotional , educational and financial support.  (540) 145-5041 ? [place] DSS Where to apply for food stamps, Medicaid and utility assistance. 6233657275 ? [facility]: Transportation to medical appointments. 307 184 0486 ? Social Security Administration: May apply for disability if have a Stage IV cancer. 715-066-5897 (682)071-5423 ? [employer], Disability and Transit Services: Assists with nutrition, care and transit needs. 307-130-0743

## 2019-04-11 NOTE — Patient Instructions (Signed)
[facility] at [facility] Discharge Instructions  Labs drawn from portacath today   Thank you for choosing  Cancer Center at [facility] to provide your oncology and hematology care.  To afford each patient quality time with our provider, please arrive at least 15 minutes before your scheduled appointment time.   If you have a lab appointment with the Cancer Center please come in thru the Main Entrance and check in at the main information desk.  You need to re-schedule your appointment should you arrive 10 or more minutes late.  We strive to give you quality time with our providers, and arriving late affects you and other patients whose appointments are after yours.  Also, if you no show three or more times for appointments you may be dismissed from the clinic at the providers discretion.     Again, thank you for choosing [facility].  Our hope is that these requests will decrease the amount of time that you wait before being seen by our physicians.       _____________________________________________________________  Should you have questions after your visit to  Cancer Center, please contact our office at [phone] between the hours of 8:00 a.m. and 4:30 p.m.  Voicemails left after 4:00 p.m. will not be returned until the following business day.  For prescription refill requests, have your pharmacy contact our office and allow 72 hours.    Due to Covid, you will need to wear a mask upon entering the hospital. If you do not have a mask, a mask will be given to you at the Main Entrance upon arrival. For doctor visits, patients may have 1 support person with them. For treatment visits, patients can not have anyone with them due to social distancing guidelines and our immunocompromised population.     

## 2019-04-11 NOTE — Patient Instructions (Signed)
[facility] at [facility] Discharge Instructions  You were seen today by Dr. Katragadda. He went over your recent lab results. He will see you back in 4 weeks for labs and follow up.   Thank you for choosing [facility] at [facility] to provide your oncology and hematology care.  To afford each patient quality time with our provider, please arrive at least 15 minutes before your scheduled appointment time.   If you have a lab appointment with the Cancer Center please come in thru the  Main Entrance and check in at the main information desk  You need to re-schedule your appointment should you arrive 10 or more minutes late.  We strive to give you quality time with our providers, and arriving late affects you and other patients whose appointments are after yours.  Also, if you no show three or more times for appointments you may be dismissed from the clinic at the providers discretion.     Again, thank you for choosing [facility].  Our hope is that these requests will decrease the amount of time that you wait before being seen by our physicians.       _____________________________________________________________  Should you have questions after your visit to [facility], please contact our office at [phone] between the hours of 8:00 a.m. and 4:30 p.m.  Voicemails left after 4:00 p.m. will not be returned until the following business day.  For prescription refill requests, have your pharmacy contact our office and allow 72 hours.    Cancer Center Support Programs:   > Cancer Support Group  2nd Tuesday of the month 1pm-2pm, Journey Room    

## 2019-04-12 ENCOUNTER — Encounter (HOSPITAL_COMMUNITY): Payer: Self-pay

## 2019-04-12 ENCOUNTER — Other Ambulatory Visit: Payer: Self-pay

## 2019-04-12 ENCOUNTER — Inpatient Hospital Stay (HOSPITAL_COMMUNITY): Payer: Medicare PPO

## 2019-04-12 VITALS — BP 108/38 | HR 63 | Temp 97.9°F | Resp 18

## 2019-04-12 DIAGNOSIS — C3432 Malignant neoplasm of lower lobe, left bronchus or lung: Secondary | ICD-10-CM | POA: Diagnosis not present

## 2019-04-12 DIAGNOSIS — C3431 Malignant neoplasm of lower lobe, right bronchus or lung: Secondary | ICD-10-CM | POA: Diagnosis not present

## 2019-04-12 DIAGNOSIS — C787 Secondary malignant neoplasm of liver and intrahepatic bile duct: Secondary | ICD-10-CM | POA: Diagnosis not present

## 2019-04-12 DIAGNOSIS — Z23 Encounter for immunization: Secondary | ICD-10-CM | POA: Diagnosis not present

## 2019-04-12 DIAGNOSIS — G629 Polyneuropathy, unspecified: Secondary | ICD-10-CM | POA: Diagnosis not present

## 2019-04-12 DIAGNOSIS — F17210 Nicotine dependence, cigarettes, uncomplicated: Secondary | ICD-10-CM | POA: Diagnosis not present

## 2019-04-12 DIAGNOSIS — Z5111 Encounter for antineoplastic chemotherapy: Secondary | ICD-10-CM | POA: Diagnosis not present

## 2019-04-12 DIAGNOSIS — C3490 Malignant neoplasm of unspecified part of unspecified bronchus or lung: Secondary | ICD-10-CM

## 2019-04-12 DIAGNOSIS — D6481 Anemia due to antineoplastic chemotherapy: Secondary | ICD-10-CM | POA: Diagnosis not present

## 2019-04-12 MED ORDER — SODIUM CHLORIDE 0.9 % IV SOLN
Freq: Once | INTRAVENOUS | Status: AC
  Administered 2019-04-12: 13:00:00 via INTRAVENOUS
  Filled 2019-04-12: qty 2

## 2019-04-12 MED ORDER — SODIUM CHLORIDE 0.9% FLUSH
10.0000 mL | INTRAVENOUS | Status: DC | PRN
  Administered 2019-04-12: 13:00:00 10 mL
  Filled 2019-04-12: qty 10

## 2019-04-12 MED ORDER — HEPARIN SOD (PORK) LOCK FLUSH 100 UNIT/ML IV SOLN
500.0000 [IU] | Freq: Once | INTRAVENOUS | Status: AC | PRN
  Administered 2019-04-12: 500 [IU]

## 2019-04-12 MED ORDER — SODIUM CHLORIDE 0.9 % IV SOLN
Freq: Once | INTRAVENOUS | Status: AC
  Administered 2019-04-12: 13:00:00 via INTRAVENOUS

## 2019-04-12 MED ORDER — TOPOTECAN HCL CHEMO INJECTION 4 MG
1.2000 mg/m2 | Freq: Once | INTRAVENOUS | Status: AC
  Administered 2019-04-12: 14:00:00 2.2 mg via INTRAVENOUS
  Filled 2019-04-12: qty 2.2

## 2019-04-12 NOTE — Progress Notes (Signed)
Patient tolerated chemotherapy with no complaints voiced.  Port site clean and dry with no bruising or swelling noted at site.  Good blood return noted before and after administration of chemotherapy.  Dressing intact.  Patient left ambulatory with VSS and no s/s of distress noted.

## 2019-04-13 ENCOUNTER — Inpatient Hospital Stay (HOSPITAL_COMMUNITY): Payer: Medicare PPO

## 2019-04-13 VITALS — BP 113/41 | HR 57 | Temp 97.6°F | Resp 18 | Wt 150.2 lb

## 2019-04-13 DIAGNOSIS — C787 Secondary malignant neoplasm of liver and intrahepatic bile duct: Secondary | ICD-10-CM | POA: Diagnosis not present

## 2019-04-13 DIAGNOSIS — C3490 Malignant neoplasm of unspecified part of unspecified bronchus or lung: Secondary | ICD-10-CM

## 2019-04-13 DIAGNOSIS — Z5111 Encounter for antineoplastic chemotherapy: Secondary | ICD-10-CM | POA: Diagnosis not present

## 2019-04-13 DIAGNOSIS — C3432 Malignant neoplasm of lower lobe, left bronchus or lung: Secondary | ICD-10-CM | POA: Diagnosis not present

## 2019-04-13 DIAGNOSIS — C3431 Malignant neoplasm of lower lobe, right bronchus or lung: Secondary | ICD-10-CM | POA: Diagnosis not present

## 2019-04-13 DIAGNOSIS — G629 Polyneuropathy, unspecified: Secondary | ICD-10-CM | POA: Diagnosis not present

## 2019-04-13 DIAGNOSIS — F17210 Nicotine dependence, cigarettes, uncomplicated: Secondary | ICD-10-CM | POA: Diagnosis not present

## 2019-04-13 DIAGNOSIS — D6481 Anemia due to antineoplastic chemotherapy: Secondary | ICD-10-CM | POA: Diagnosis not present

## 2019-04-13 DIAGNOSIS — Z23 Encounter for immunization: Secondary | ICD-10-CM | POA: Diagnosis not present

## 2019-04-13 MED ORDER — HEPARIN SOD (PORK) LOCK FLUSH 100 UNIT/ML IV SOLN
500.0000 [IU] | Freq: Once | INTRAVENOUS | Status: AC | PRN
  Administered 2019-04-13: 500 [IU]

## 2019-04-13 MED ORDER — TOPOTECAN HCL CHEMO INJECTION 4 MG
1.2000 mg/m2 | Freq: Once | INTRAVENOUS | Status: AC
  Administered 2019-04-13: 14:00:00 2.2 mg via INTRAVENOUS
  Filled 2019-04-13: qty 2.2

## 2019-04-13 MED ORDER — SODIUM CHLORIDE 0.9 % IV SOLN
Freq: Once | INTRAVENOUS | Status: AC
  Administered 2019-04-13: 13:00:00 via INTRAVENOUS
  Filled 2019-04-13: qty 2

## 2019-04-13 MED ORDER — SODIUM CHLORIDE 0.9 % IV SOLN
Freq: Once | INTRAVENOUS | Status: AC
  Administered 2019-04-13: 13:00:00 via INTRAVENOUS

## 2019-04-13 MED ORDER — SODIUM CHLORIDE 0.9% FLUSH
10.0000 mL | INTRAVENOUS | Status: DC | PRN
  Administered 2019-04-13 (×2): 10 mL
  Filled 2019-04-13 (×2): qty 10

## 2019-04-13 NOTE — Patient Instructions (Signed)
[facility] Discharge Instructions for Patients Receiving Chemotherapy  Today you received the following chemotherapy agents   To help prevent nausea and vomiting after your treatment, we encourage you to take your nausea medication   If you develop nausea and vomiting that is not controlled by your nausea medication, call the clinic.   BELOW ARE SYMPTOMS THAT SHOULD BE REPORTED IMMEDIATELY:  *FEVER GREATER THAN 100.5 F  *CHILLS WITH OR WITHOUT FEVER  NAUSEA AND VOMITING THAT IS NOT CONTROLLED WITH YOUR NAUSEA MEDICATION  *UNUSUAL SHORTNESS OF BREATH  *UNUSUAL BRUISING OR BLEEDING  TENDERNESS IN MOUTH AND THROAT WITH OR WITHOUT PRESENCE OF ULCERS  *URINARY PROBLEMS  *BOWEL PROBLEMS  UNUSUAL RASH Items with * indicate a potential emergency and should be followed up as soon as possible.  Feel free to call the clinic should you have any questions or concerns. The clinic phone number is [phone].  Please show the CHEMO ALERT CARD at check-in to the Emergency Department and triage nurse.   

## 2019-04-13 NOTE — Progress Notes (Signed)
Treatment given today per MD orders. Tolerated infusion without adverse affects. Vital signs stable. No complaints at this time. Discharged from clinic ambulatory. F/U with [facility] as scheduled.   

## 2019-04-14 ENCOUNTER — Inpatient Hospital Stay (HOSPITAL_COMMUNITY): Payer: Medicare PPO | Attending: Hematology

## 2019-04-14 ENCOUNTER — Encounter (HOSPITAL_COMMUNITY): Payer: Self-pay

## 2019-04-14 ENCOUNTER — Other Ambulatory Visit: Payer: Self-pay

## 2019-04-14 VITALS — BP 117/46 | HR 56 | Temp 97.7°F | Resp 18

## 2019-04-14 DIAGNOSIS — Z5111 Encounter for antineoplastic chemotherapy: Secondary | ICD-10-CM | POA: Insufficient documentation

## 2019-04-14 DIAGNOSIS — C3431 Malignant neoplasm of lower lobe, right bronchus or lung: Secondary | ICD-10-CM | POA: Insufficient documentation

## 2019-04-14 DIAGNOSIS — C787 Secondary malignant neoplasm of liver and intrahepatic bile duct: Secondary | ICD-10-CM | POA: Insufficient documentation

## 2019-04-14 DIAGNOSIS — D6481 Anemia due to antineoplastic chemotherapy: Secondary | ICD-10-CM | POA: Insufficient documentation

## 2019-04-14 DIAGNOSIS — C3432 Malignant neoplasm of lower lobe, left bronchus or lung: Secondary | ICD-10-CM | POA: Insufficient documentation

## 2019-04-14 DIAGNOSIS — C3490 Malignant neoplasm of unspecified part of unspecified bronchus or lung: Secondary | ICD-10-CM

## 2019-04-14 MED ORDER — HEPARIN SOD (PORK) LOCK FLUSH 100 UNIT/ML IV SOLN
500.0000 [IU] | Freq: Once | INTRAVENOUS | Status: AC | PRN
  Administered 2019-04-14: 500 [IU]

## 2019-04-14 MED ORDER — TOPOTECAN HCL CHEMO INJECTION 4 MG
1.2000 mg/m2 | Freq: Once | INTRAVENOUS | Status: AC
  Administered 2019-04-14: 15:00:00 2.2 mg via INTRAVENOUS
  Filled 2019-04-14: qty 2.2

## 2019-04-14 MED ORDER — SODIUM CHLORIDE 0.9% FLUSH
10.0000 mL | INTRAVENOUS | Status: DC | PRN
  Administered 2019-04-14: 13:00:00 10 mL
  Filled 2019-04-14: qty 10

## 2019-04-14 MED ORDER — SODIUM CHLORIDE 0.9 % IV SOLN
Freq: Once | INTRAVENOUS | Status: AC
  Administered 2019-04-14: 13:00:00 via INTRAVENOUS
  Filled 2019-04-14: qty 2

## 2019-04-14 MED ORDER — SODIUM CHLORIDE 0.9 % IV SOLN
Freq: Once | INTRAVENOUS | Status: AC
  Administered 2019-04-14: 13:00:00 via INTRAVENOUS

## 2019-04-14 NOTE — Progress Notes (Signed)
Patient tolerated chemotherapy with no complaints voiced.  Port site clean and dry with no bruising or swelling noted at site.  Good blood return noted before and after administration of chemotherapy.  Band aid applied.  Patient left ambulatory with VSS and no s/s of distress noted.

## 2019-04-15 ENCOUNTER — Encounter (HOSPITAL_COMMUNITY): Payer: Self-pay

## 2019-04-15 ENCOUNTER — Inpatient Hospital Stay (HOSPITAL_COMMUNITY): Payer: Medicare PPO

## 2019-04-15 VITALS — BP 117/43 | HR 58 | Temp 97.7°F | Resp 18

## 2019-04-15 DIAGNOSIS — C3432 Malignant neoplasm of lower lobe, left bronchus or lung: Secondary | ICD-10-CM | POA: Diagnosis not present

## 2019-04-15 DIAGNOSIS — C3431 Malignant neoplasm of lower lobe, right bronchus or lung: Secondary | ICD-10-CM | POA: Diagnosis not present

## 2019-04-15 DIAGNOSIS — D6481 Anemia due to antineoplastic chemotherapy: Secondary | ICD-10-CM | POA: Diagnosis not present

## 2019-04-15 DIAGNOSIS — C3490 Malignant neoplasm of unspecified part of unspecified bronchus or lung: Secondary | ICD-10-CM

## 2019-04-15 DIAGNOSIS — Z5111 Encounter for antineoplastic chemotherapy: Secondary | ICD-10-CM | POA: Diagnosis not present

## 2019-04-15 DIAGNOSIS — C787 Secondary malignant neoplasm of liver and intrahepatic bile duct: Secondary | ICD-10-CM | POA: Diagnosis not present

## 2019-04-15 MED ORDER — TOPOTECAN HCL CHEMO INJECTION 4 MG
1.2000 mg/m2 | Freq: Once | INTRAVENOUS | Status: AC
  Administered 2019-04-15: 12:00:00 2.2 mg via INTRAVENOUS
  Filled 2019-04-15: qty 2.2

## 2019-04-15 MED ORDER — SODIUM CHLORIDE 0.9 % IV SOLN
Freq: Once | INTRAVENOUS | Status: AC
  Administered 2019-04-15: 11:00:00 via INTRAVENOUS
  Filled 2019-04-15: qty 2

## 2019-04-15 MED ORDER — SODIUM CHLORIDE 0.9% FLUSH
10.0000 mL | INTRAVENOUS | Status: DC | PRN
  Administered 2019-04-15 (×2): 10 mL
  Filled 2019-04-15 (×2): qty 10

## 2019-04-15 MED ORDER — HEPARIN SOD (PORK) LOCK FLUSH 100 UNIT/ML IV SOLN
500.0000 [IU] | Freq: Once | INTRAVENOUS | Status: AC | PRN
  Administered 2019-04-15: 14:00:00 500 [IU]

## 2019-04-15 MED ORDER — PEGFILGRASTIM 6 MG/0.6ML ~~LOC~~ PSKT
6.0000 mg | PREFILLED_SYRINGE | Freq: Once | SUBCUTANEOUS | Status: AC
  Administered 2019-04-15: 13:00:00 6 mg via SUBCUTANEOUS

## 2019-04-15 MED ORDER — SODIUM CHLORIDE 0.9 % IV SOLN
Freq: Once | INTRAVENOUS | Status: AC
  Administered 2019-04-15: 11:00:00 via INTRAVENOUS

## 2019-04-15 MED ORDER — PEGFILGRASTIM 6 MG/0.6ML ~~LOC~~ PSKT
PREFILLED_SYRINGE | SUBCUTANEOUS | Status: AC
  Filled 2019-04-15: qty 0.6

## 2019-04-15 NOTE — Patient Instructions (Signed)
[facility] Discharge Instructions for Patients Receiving Chemotherapy  Today you received the following chemotherapy agents   To help prevent nausea and vomiting after your treatment, we encourage you to take your nausea medication   If you develop nausea and vomiting that is not controlled by your nausea medication, call the clinic.   BELOW ARE SYMPTOMS THAT SHOULD BE REPORTED IMMEDIATELY:  *FEVER GREATER THAN 100.5 F  *CHILLS WITH OR WITHOUT FEVER  NAUSEA AND VOMITING THAT IS NOT CONTROLLED WITH YOUR NAUSEA MEDICATION  *UNUSUAL SHORTNESS OF BREATH  *UNUSUAL BRUISING OR BLEEDING  TENDERNESS IN MOUTH AND THROAT WITH OR WITHOUT PRESENCE OF ULCERS  *URINARY PROBLEMS  *BOWEL PROBLEMS  UNUSUAL RASH Items with * indicate a potential emergency and should be followed up as soon as possible.  Feel free to call the clinic should you have any questions or concerns. The clinic phone number is [phone].  Please show the CHEMO ALERT CARD at check-in to the Emergency Department and triage nurse.   

## 2019-04-15 NOTE — Progress Notes (Signed)
Pt presents today for Day 5 Topotecan. VSS. MAR reviewed. Pt has no complaints of any changes since the last visit.   Treatment given today per MD orders. Tolerated infusion without adverse affects. Vital signs stable. No complaints at this time. Discharged from clinic ambulatory. F/U with [facility] as scheduled.

## 2019-04-26 ENCOUNTER — Other Ambulatory Visit: Payer: Self-pay | Admitting: Family Medicine

## 2019-04-26 DIAGNOSIS — F5101 Primary insomnia: Secondary | ICD-10-CM

## 2019-04-26 DIAGNOSIS — F411 Generalized anxiety disorder: Secondary | ICD-10-CM

## 2019-04-26 NOTE — Telephone Encounter (Signed)
Last refilled: 03/29/2019 Last office visit: 12/28/2018

## 2019-05-09 ENCOUNTER — Inpatient Hospital Stay (HOSPITAL_COMMUNITY): Payer: Medicare PPO

## 2019-05-09 ENCOUNTER — Other Ambulatory Visit: Payer: Self-pay

## 2019-05-09 ENCOUNTER — Inpatient Hospital Stay (HOSPITAL_BASED_OUTPATIENT_CLINIC_OR_DEPARTMENT_OTHER): Payer: Medicare PPO | Admitting: Hematology

## 2019-05-09 ENCOUNTER — Encounter (HOSPITAL_COMMUNITY): Payer: Self-pay | Admitting: Hematology

## 2019-05-09 VITALS — BP 130/66 | HR 53 | Temp 96.4°F | Resp 18 | Wt 147.2 lb

## 2019-05-09 DIAGNOSIS — C3490 Malignant neoplasm of unspecified part of unspecified bronchus or lung: Secondary | ICD-10-CM

## 2019-05-09 DIAGNOSIS — C787 Secondary malignant neoplasm of liver and intrahepatic bile duct: Secondary | ICD-10-CM | POA: Diagnosis not present

## 2019-05-09 DIAGNOSIS — C3431 Malignant neoplasm of lower lobe, right bronchus or lung: Secondary | ICD-10-CM | POA: Diagnosis not present

## 2019-05-09 DIAGNOSIS — Z5111 Encounter for antineoplastic chemotherapy: Secondary | ICD-10-CM | POA: Diagnosis not present

## 2019-05-09 DIAGNOSIS — C3432 Malignant neoplasm of lower lobe, left bronchus or lung: Secondary | ICD-10-CM | POA: Diagnosis not present

## 2019-05-09 DIAGNOSIS — D6481 Anemia due to antineoplastic chemotherapy: Secondary | ICD-10-CM | POA: Diagnosis not present

## 2019-05-09 LAB — COMPREHENSIVE METABOLIC PANEL
ALT: 14 U/L (ref 0–44)
AST: 16 U/L (ref 15–41)
Albumin: 4 g/dL (ref 3.5–5.0)
Alkaline Phosphatase: 52 U/L (ref 38–126)
Anion gap: 11 (ref 5–15)
BUN: 17 mg/dL (ref 8–23)
CO2: 21 mmol/L — ABNORMAL LOW (ref 22–32)
Calcium: 8.9 mg/dL (ref 8.9–10.3)
Chloride: 105 mmol/L (ref 98–111)
Creatinine, Ser: 0.79 mg/dL (ref 0.61–1.24)
GFR calc Af Amer: >60 mL/min (ref >60)
GFR calc non Af Amer: >60 mL/min (ref >60)
Glucose, Bld: 104 mg/dL — ABNORMAL HIGH (ref 70–99)
Potassium: 3.8 mmol/L (ref 3.5–5.1)
Sodium: 137 mmol/L (ref 135–145)
Total Bilirubin: 0.7 mg/dL (ref 0.3–1.2)
Total Protein: 6.7 g/dL (ref 6.5–8.1)

## 2019-05-09 LAB — FOLATE: Folate: 44 ng/mL (ref >5.9)

## 2019-05-09 LAB — CBC WITH DIFFERENTIAL/PLATELET
Abs Immature Granulocytes: 0.03 10*3/uL (ref 0.00–0.07)
Basophils Absolute: 0.1 10*3/uL (ref 0.0–0.1)
Basophils Relative: 1 %
Eosinophils Absolute: 0.1 10*3/uL (ref 0.0–0.5)
Eosinophils Relative: 2 %
HCT: 27.5 % — ABNORMAL LOW (ref 39.0–52.0)
Hemoglobin: 8.6 g/dL — ABNORMAL LOW (ref 13.0–17.0)
Immature Granulocytes: 0 %
Lymphocytes Relative: 19 %
Lymphs Abs: 1.4 10*3/uL (ref 0.7–4.0)
MCH: 31.7 pg (ref 26.0–34.0)
MCHC: 31.3 g/dL (ref 30.0–36.0)
MCV: 101.5 fL — ABNORMAL HIGH (ref 80.0–100.0)
Monocytes Absolute: 0.8 10*3/uL (ref 0.1–1.0)
Monocytes Relative: 11 %
Neutro Abs: 5 10*3/uL (ref 1.7–7.7)
Neutrophils Relative %: 67 %
Platelets: 379 10*3/uL (ref 150–400)
RBC: 2.71 MIL/uL — ABNORMAL LOW (ref 4.22–5.81)
RDW: 22.8 % — ABNORMAL HIGH (ref 11.5–15.5)
WBC: 7.4 10*3/uL (ref 4.0–10.5)
nRBC: 0 % (ref 0.0–0.2)

## 2019-05-09 LAB — IRON AND TIBC
Iron: 33 ug/dL — ABNORMAL LOW (ref 45–182)
Saturation Ratios: 11 % — ABNORMAL LOW (ref 17.9–39.5)
TIBC: 312 ug/dL (ref 250–450)
UIBC: 279 ug/dL

## 2019-05-09 LAB — FERRITIN: Ferritin: 164 ng/mL (ref 24–336)

## 2019-05-09 LAB — VITAMIN B12: Vitamin B-12: 2430 pg/mL — ABNORMAL HIGH (ref 180–914)

## 2019-05-09 MED ORDER — HEPARIN SOD (PORK) LOCK FLUSH 100 UNIT/ML IV SOLN
500.0000 [IU] | Freq: Once | INTRAVENOUS | Status: AC | PRN
  Administered 2019-05-09: 13:00:00 500 [IU]

## 2019-05-09 MED ORDER — SODIUM CHLORIDE 0.9 % IV SOLN
Freq: Once | INTRAVENOUS | Status: AC
  Administered 2019-05-09: 11:00:00 via INTRAVENOUS
  Filled 2019-05-09: qty 2

## 2019-05-09 MED ORDER — TOPOTECAN HCL CHEMO INJECTION 4 MG
1.2000 mg/m2 | Freq: Once | INTRAVENOUS | Status: AC
  Administered 2019-05-09: 2.2 mg via INTRAVENOUS
  Filled 2019-05-09: qty 2.2

## 2019-05-09 MED ORDER — SODIUM CHLORIDE 0.9% FLUSH
10.0000 mL | INTRAVENOUS | Status: DC | PRN
  Administered 2019-05-09: 10 mL
  Filled 2019-05-09: qty 10

## 2019-05-09 MED ORDER — SODIUM CHLORIDE 0.9 % IV SOLN
Freq: Once | INTRAVENOUS | Status: AC
  Administered 2019-05-09: 11:00:00 via INTRAVENOUS

## 2019-05-09 NOTE — Patient Instructions (Signed)
[facility] at [facility] Discharge Instructions  Labs drawn from portacath today   Thank you for choosing [facility] at [facility] to provide your oncology and hematology care.  To afford each patient quality time with our provider, please arrive at least 15 minutes before your scheduled appointment time.   If you have a lab appointment with the Cancer Center please come in thru the Main Entrance and check in at the main information desk.  You need to re-schedule your appointment should you arrive 10 or more minutes late.  We strive to give you quality time with our providers, and arriving late affects you and other patients whose appointments are after yours.  Also, if you no show three or more times for appointments you may be dismissed from the clinic at the providers discretion.     Again, thank you for choosing [facility].  Our hope is that these requests will decrease the amount of time that you wait before being seen by our physicians.       _____________________________________________________________  Should you have questions after your visit to [facility], please contact our office at [phone] between the hours of 8:00 a.m. and 4:30 p.m.  Voicemails left after 4:00 p.m. will not be returned until the following business day.  For prescription refill requests, have your pharmacy contact our office and allow 72 hours.    Due to Covid, you will need to wear a mask upon entering the hospital. If you do not have a mask, a mask will be given to you at the Main Entrance upon arrival. For doctor visits, patients may have 1 support person with them. For treatment visits, patients can not have anyone with them due to social distancing guidelines and our immunocompromised population.     

## 2019-05-09 NOTE — Progress Notes (Signed)
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 Bulky 5.4 cm right supraclavicular region malignant lymph node conglomeration with extracapsular extension. 2. Surrounding smaller abnormal right level 3 and level 5 lymph nodes, and the lymphadenopathy continues into the superior mediastinum, see Chest CT findings reported separately. 3. No other metastatic disease identified in the neck.   01/16/2017 Imaging   CT chest: IMPRESSION: 1. Extensive lymphadenopathy in the thorax and lower right cervical region, as discussed above. Primary differential considerations include lymphoma/leukemia or small cell carcinoma of the lung. Further evaluation a PET-CT could be considered to assess for additional sites of disease below the diaphragm if clinically appropriate. Additionally, ultrasound-guided biopsy of supraclavicular lymphadenopathy could be considered to establish a tissue diagnosis. 2. Indeterminate lesion in the periphery of segment 8 of the liver measuring 2.7 x 1.7 cm. Attention at time of follow-up PET-CT is recommended. 3. Aortic atherosclerosis, in addition to left main and 3 vessel coronary artery disease. Please note that although the presence of coronary artery calcium documents the presence of coronary artery disease, the severity of this disease and any potential stenosis cannot be assessed on this non-gated CT examination. Assessment for potential risk factor modification, dietary therapy or pharmacologic therapy may be warranted, if clinically indicated. 4. There are calcifications of the aortic valve.  Echocardiographic correlation for evaluation of potential valvular dysfunction may be warranted if clinically indicated. 5. Diffuse bronchial wall thickening with moderate centrilobular and paraseptal emphysema; imaging findings suggestive of underlying COPD.   02/03/2017 Initial Biopsy   (R) neck lymph node biopsy: SMALL CELL CARCINOMA (most likely lung primary).    02/03/2017 Miscellaneous   Port-a-cath attempted by IR; unable to place d/t enlarged SVC.    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 Staging for metastatic disease  EXAM: MRI HEAD WITHOUT AND WITH CONTRAST  TECHNIQUE: Multiplanar, multiecho pulse sequences of the brain and surrounding structures were obtained without and with intravenous contrast.  CONTRAST:  38m MULTIHANCE GADOBENATE DIMEGLUMINE 529 MG/ML IV SOLN  COMPARISON:  None.  FINDINGS: Brain: Negative for hydrocephalus. Cerebral volume normal for age. Small nonenhancing white matter hyperintensities consistent with mild chronic microvascular ischemia. No acute infarct. Negative for hemorrhage or mass or edema  Normal enhancement postcontrast infusion. No enhancing mass lesion. Leptomeningeal enhancement is normal.  Vascular: Normal arterial flow voids.  Normal venous enhancement  Skull and upper cervical spine: Negative  Sinuses/Orbits: Negative  Other: None  IMPRESSION: Negative for metastatic disease.  No acute abnormality.  Mild chronic white matter changes.   04/07/2017 Imaging    PET:  1. Marked reduction in size and metabolic activity of bulky RIGHT supraclavicular adenopathy mediastinal lymphadenopathy. 2. Residual moderate activity remains within small RIGHT supraclavicular lymph node, RIGHT lower paratracheal  lymph node and RIGHT hilar lymph node. 3. Resolution of prevascular and internal mammary mediastinal metastatic hypermetabolic activity. 4. Resolution of metabolic activity associated with solitary RIGHT  hepatic lobe liver metastasis. 5. No evidence of disease progression. 6. 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   05/27/2017 Imaging   MRI brain w/ and w/o contrast IMPRESSION: 1. No metastatic disease identified. 2. Increased nonspecific cerebral white matter signal changes since August. These are most commonly small vessel disease related. 3. New right maxillary sinusitis. Benign appearing retention cysts in the nasopharynx with trace mastoid effusions.   06/12/2017 Imaging   PET-CT IMPRESSION: 1. There are two new hypermetabolic nodules identified within both lower lobes measuring up to 3.1 cm. The appearance is nonspecific and may be inflammatory/infectious in etiology. Pulmonary metastatic disease cannot be excluded and short-term follow-up imaging in 3 months is advised to reassess these nodules. 2. Stable appearance of mild hypermetabolic activity associated with right paratracheal and right hilar lymph nodes. 3. Decrease in FDG uptake associated with index right supraclavicular lymph node. 4. 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 5. Aortic Atherosclerosis (ICD10-I70.0) and Emphysema (ICD10-J43.9).   08/24/2017 -  Chemotherapy   The patient had pegfilgrastim (NEULASTA ONPRO KIT) injection 6 mg, 6 mg, Subcutaneous, Once, 24 of 24 cycles Administration: 6 mg (08/28/2017), 6 mg (09/18/2017), 6 mg (10/09/2017), 6 mg (11/02/2017), 6 mg (11/20/2017), 6 mg (12/18/2017), 6 mg (01/15/2018), 6 mg (02/12/2018), 6 mg (03/12/2018), 6 mg (04/09/2018), 6 mg (05/07/2018), 6 mg (06/04/2018), 6 mg  (07/02/2018), 6 mg (07/30/2018), 6 mg (08/27/2018), 6 mg (09/24/2018), 6 mg (10/29/2018), 6 mg (11/26/2018), 6 mg (12/24/2018), 6 mg (01/21/2019), 6 mg (02/18/2019), 6 mg (03/18/2019), 6 mg (04/15/2019) topotecan (HYCAMTIN) 2.9 mg in sodium chloride 0.9 % 100 mL chemo infusion, 1.5 mg/m2 = 2.9 mg, Intravenous,  Once, 24 of 24 cycles Dose modification: 1.2 mg/m2 (80 % of original dose 1.5 mg/m2, Cycle 4, Reason: Dose Not Tolerated) Administration: 2.9 mg (08/24/2017), 2.9 mg (08/25/2017), 2.9 mg (08/28/2017), 2.9 mg (08/26/2017), 2.9 mg (08/27/2017), 2.9 mg (09/14/2017), 2.9 mg (09/15/2017), 2.9 mg (09/16/2017), 2.9 mg (09/17/2017), 2.9 mg (09/18/2017), 2.9 mg (10/05/2017), 2.9 mg (10/06/2017), 2.9 mg (10/07/2017), 2.9 mg (10/08/2017), 2.9 mg (10/09/2017), 2.3 mg (10/26/2017), 2.3 mg (10/27/2017), 2.3 mg (10/28/2017), 2.3 mg (10/29/2017), 2.3 mg (11/02/2017), 2.3 mg (11/16/2017), 2.3 mg (11/17/2017), 2.3 mg (11/18/2017), 2.3 mg (11/19/2017), 2.3 mg (11/20/2017), 2.3 mg (12/14/2017), 2.3 mg (12/15/2017), 2.3 mg (12/16/2017), 2.3 mg (12/17/2017), 2.3 mg (12/18/2017), 2.3 mg (01/11/2018), 2.3 mg (01/12/2018), 2.3 mg (01/13/2018), 2.3 mg (01/15/2018), 2.3 mg (02/08/2018), 2.3 mg (02/09/2018), 2.3 mg (02/10/2018), 2.3 mg (02/11/2018), 2.3 mg (02/12/2018), 2.3 mg (03/08/2018), 2.3 mg (03/09/2018), 2.3 mg (03/10/2018), 2.3 mg (03/11/2018), 2.3 mg (03/12/2018), 2.2 mg (04/05/2018), 2.2 mg (04/06/2018), 2.2 mg (04/07/2018), 2.2 mg (04/08/2018), 2.2 mg (04/09/2018), 2.2 mg (05/03/2018), 2.2 mg (05/04/2018), 2.2 mg (05/05/2018), 2.2 mg (05/06/2018), 2.2 mg (05/07/2018), 2.2 mg (05/31/2018), 2.2 mg (06/01/2018), 2.2 mg (06/02/2018), 2.2 mg (06/03/2018), 2.2 mg (06/04/2018), 2.2 mg (06/28/2018), 2.2 mg (06/29/2018), 2.2 mg (06/30/2018), 2.2 mg (07/01/2018), 2.2 mg (07/02/2018), 2.2 mg (07/26/2018), 2.2 mg (07/27/2018), 2.2 mg (07/28/2018), 2.2 mg (07/29/2018), 2.2 mg (07/30/2018), 2.2 mg (08/23/2018), 2.2 mg (08/24/2018), 2.2 mg (08/25/2018), 2.2 mg (08/26/2018), 2.2 mg (08/27/2018), 2.2 mg (09/20/2018), 2.2 mg  (09/21/2018), 2.2 mg (09/22/2018), 2.2 mg (09/23/2018), 2.2 mg (09/24/2018), 2.2 mg (10/25/2018), 2.2 mg (10/26/2018), 2.2 mg (10/27/2018), 2.2 mg (10/28/2018), 2.2 mg (10/29/2018), 2.2 mg (11/22/2018), 2.2 mg (11/23/2018), 2.2 mg (11/24/2018), 2.2 mg (11/25/2018), 2.2 mg (11/26/2018),  2.2 mg (12/20/2018), 2.2 mg (12/21/2018), 2.2 mg (12/22/2018), 2.2 mg (12/23/2018), 2.2 mg (12/24/2018), 2.2 mg (01/17/2019), 2.2 mg (01/18/2019), 2.2 mg (01/19/2019), 2.2 mg (01/20/2019), 2.2 mg (01/21/2019), 2.2 mg (02/14/2019), 2.2 mg (02/15/2019), 2.2 mg (02/16/2019), 2.2 mg (02/17/2019), 2.2 mg (02/18/2019), 2.2 mg (03/14/2019), 2.2 mg (03/15/2019), 2.2 mg (03/16/2019), 2.2 mg (03/17/2019), 2.2 mg (03/18/2019), 2.2 mg (04/11/2019), 2.2 mg (04/12/2019), 2.2 mg (04/13/2019), 2.2 mg (04/14/2019), 2.2 mg (04/15/2019), 2.2 mg (05/09/2019) ondansetron (ZOFRAN) 4 mg in sodium chloride 0.9 % 50 mL IVPB, , Intravenous,  Once, 19 of 19 cycles Administration:  (02/08/2018),  (02/09/2018),  (02/10/2018),  (02/11/2018),  (02/12/2018),  (03/08/2018),  (03/09/2018),  (03/10/2018),  (03/11/2018),  (03/12/2018),  (04/05/2018),  (04/06/2018),  (04/07/2018),  (04/08/2018),  (04/09/2018),  (05/03/2018),  (05/04/2018),  (05/05/2018),  (05/06/2018),  (05/07/2018),  (05/31/2018),  (06/01/2018),  (06/02/2018),  (06/03/2018),  (06/04/2018),  (06/28/2018),  (06/29/2018),  (06/30/2018),  (07/01/2018),  (07/02/2018),  (07/26/2018),  (07/27/2018),  (07/28/2018),  (07/29/2018),  (07/30/2018),  (08/23/2018),  (08/24/2018),  (08/25/2018),  (08/26/2018),  (08/27/2018),  (09/20/2018),  (09/21/2018),  (09/22/2018),  (09/23/2018),  (09/24/2018),  (10/25/2018),  (10/26/2018),  (10/27/2018),  (10/28/2018),  (10/29/2018),  (11/22/2018),  (11/23/2018),  (11/24/2018),  (11/25/2018),  (11/26/2018),  (12/20/2018),  (12/21/2018),  (12/22/2018),  (12/23/2018),  (12/24/2018),  (01/17/2019),  (01/18/2019),  (01/19/2019),  (01/20/2019),  (01/21/2019),  (02/14/2019),  (02/15/2019),  (02/16/2019),  (02/17/2019),  (02/18/2019),  (03/14/2019),  (03/15/2019),  (03/16/2019),  (03/17/2019),  (03/18/2019),   (04/11/2019),  (04/12/2019),  (04/13/2019),  (04/14/2019),  (04/15/2019),  (05/09/2019)  for chemotherapy treatment.         INTERVAL HISTORY:  [name] 68 y.o. male seen for follow-up of small cell lung cancer and next cycle of chemotherapy.  He reports that he cut back on Percocet and is taking 1 to 2 tablets daily.  Appetite is 25%.  Energy levels are 75%.  Numbness in the feet has been stable. Denies any fevers, night sweats or weight loss.  Denies any nausea, vomiting, diarrhea or constipation.  REVIEW OF SYSTEMS:  Review of Systems  Neurological: Positive for numbness.  Psychiatric/Behavioral: Positive for sleep disturbance.  All other systems reviewed and are negative.    PAST MEDICAL/SURGICAL HISTORY:  Past Medical History:  Diagnosis Date   Anxiety    CAD (coronary artery disease)    Cancer (HCC)    stage 4 small cell lung cancer    COPD (chronic obstructive pulmonary disease) (HCC)    Depression    Dyspnea    increased exertion   Feeling of chest tightness    Heart palpitations    History of chemotherapy    Myocardial infarction ([place])    Osteopenia    Panic attacks    Smoker    Past Surgical History:  Procedure Laterality Date   BACK SURGERY  12/24/2000   L5,S1   CORONARY STENT PLACEMENT  2005   RCA & CX   HERNIA REPAIR Right 1980's   INGUINAL HERNIA REPAIR  12/1978   right side   IR FLUORO GUIDE PORT INSERTION RIGHT  04/02/2017   IR US GUIDE BX ASP/DRAIN  02/03/2017   IR US GUIDE VASC ACCESS RIGHT  04/02/2017   NM MYOCAR PERF WALL MOTION  09/07/2009   No ischemia; EF 51%   SHOULDER SURGERY Left 08/2010   SPINE SURGERY  2002   L5-S1     SOCIAL HISTORY:  Social History   Socioeconomic History   Marital status: Single    Spouse name: Not on file  Number of children: Not on file   Years of education: Not on file   Highest education level: Not on file  Occupational History   Not on file  Social Needs   Financial resource  strain: Not on file   Food insecurity    Worry: Not on file    Inability: Not on file   Transportation needs    Medical: Not on file    Non-medical: Not on file  Tobacco Use   Smoking status: Current Every Day Smoker    Packs/day: 1.00    Types: Cigarettes   Smokeless tobacco: Never Used  Substance and Sexual Activity   Alcohol use: Yes    Comment: occas   Drug use: No   Sexual activity: Not on file  Lifestyle   Physical activity    Days per week: Not on file    Minutes per session: Not on file   Stress: Not on file  Relationships   Social connections    Talks on phone: Not on file    Gets together: Not on file    Attends religious service: Not on file    Active member of club or organization: Not on file    Attends meetings of clubs or organizations: Not on file    Relationship status: Not on file   Intimate partner violence    Fear of current or ex partner: Not on file    Emotionally abused: Not on file    Physically abused: Not on file    Forced sexual activity: Not on file  Other Topics Concern   Not on file  Social History Narrative   Not on file    FAMILY HISTORY:  Family History  Problem Relation Age of Onset   Heart attack Father    Kidney disease Father        renal failure   Heart failure Mother    Heart attack Mother    Cancer Brother    Diabetes Brother    Alcohol abuse Brother    Diabetes Sister     CURRENT MEDICATIONS:  Outpatient Encounter Medications as of 05/09/2019  Medication Sig   ALPRAZolam (XANAX) 1 MG tablet TAKE (1) TABLET BY MOUTH (4) TIMES DAILY.   aspirin EC 81 MG tablet Take 81 mg by mouth daily.   atorvastatin (LIPITOR) 40 MG tablet TAKE (1) TABLET BY MOUTH ONCE DAILY.   cholecalciferol (VITAMIN D3) 25 MCG (1000 UT) tablet Take 1,000 Units by mouth daily.   clotrimazole-betamethasone (LOTRISONE) cream Apply 1 application topically 2 (two) times daily.   gabapentin (NEURONTIN) 300 MG capsule Take 2  capsules (600 mg total) by mouth 3 (three) times daily.   Pegfilgrastim (NEULASTA ONPRO [place]) Inject into the skin. Every 21 days   polyethylene glycol powder (GLYCOLAX/MIRALAX) powder Take 17 g by mouth daily.   vitamin B-12 (CYANOCOBALAMIN) 100 MCG tablet Take 100 mcg by mouth daily.   [DISCONTINUED] Coenzyme Q10 (COQ10) 30 MG CAPS Take 30 mg by mouth daily.    [DISCONTINUED] traMADol (ULTRAM) 50 MG tablet TAKE (1) TABLET BY MOUTH (3) TIMES DAILY.   albuterol (VENTOLIN HFA) 108 (90 Base) MCG/ACT inhaler Inhale 2 puffs into the lungs every 6 (six) hours as needed for wheezing or shortness of breath.   hydrOXYzine (ATARAX/VISTARIL) 50 MG tablet TAKE ONE TABLET BY MOUTH AT BEDTIME AS NEEDED FOR SLEEP. (Patient not taking: Reported on 05/09/2019)   ondansetron (ZOFRAN) 8 MG tablet Take 1 tablet (8 mg total) by mouth 2 (two)  times daily as needed. Start on the third day after cisplatin chemotherapy. (Patient not taking: Reported on 05/09/2019)   oxyCODONE-acetaminophen (PERCOCET/ROXICET) 5-325 MG tablet 1 and half tablet twice daily as needed. (Patient not taking: Reported on 05/09/2019)   prochlorperazine (COMPAZINE) 10 MG tablet Take 1 tablet (10 mg total) by mouth every 6 (six) hours as needed (Nausea or vomiting). (Patient not taking: Reported on 05/09/2019)   topotecan in sodium chloride 0.9 % 100 mL Inject into the vein once. Days 1-5 every 21 days   No facility-administered encounter medications on file as of 05/09/2019.     ALLERGIES:  Allergies  Allergen Reactions   Codeine Nausea Only   Niaspan [Niacin Er]      PHYSICAL EXAM:  ECOG Performance status: 1  There were no vitals filed for this visit. There were no vitals filed for this visit.  Physical Exam Vitals signs reviewed.  Constitutional:      Appearance: Normal appearance.  Cardiovascular:     Rate and Rhythm: Normal rate and regular rhythm.     Heart sounds: Normal heart sounds.  Pulmonary:     Effort:  Pulmonary effort is normal.     Breath sounds: Normal breath sounds.  Abdominal:     General: There is no distension.     Palpations: Abdomen is soft. There is no mass.  Musculoskeletal:        General: No swelling.  Skin:    General: Skin is warm.  Neurological:     General: No focal deficit present.     Mental Status: He is alert and oriented to person, place, and time.  Psychiatric:        Mood and Affect: Mood normal.        Behavior: Behavior normal.      LABORATORY DATA:  I have reviewed the labs as listed.  CBC    Component Value Date/Time   WBC 7.4 05/09/2019 0922   RBC 2.71 (L) 05/09/2019 0922   HGB 8.6 (L) 05/09/2019 0922   HCT 27.5 (L) 05/09/2019 0922   PLT 379 05/09/2019 0922   MCV 101.5 (H) 05/09/2019 0922   MCH 31.7 05/09/2019 0922   MCHC 31.3 05/09/2019 0922   RDW 22.8 (H) 05/09/2019 0922   LYMPHSABS 1.4 05/09/2019 0922   MONOABS 0.8 05/09/2019 0922   EOSABS 0.1 05/09/2019 0922   BASOSABS 0.1 05/09/2019 0922   CMP Latest Ref Rng & Units 05/09/2019 04/11/2019 03/14/2019  Glucose 70 - 99 mg/dL 104(H) 102(H) 95  BUN 8 - 23 mg/dL '17 15 17  '$ Creatinine 0.61 - 1.24 mg/dL 0.79 0.84 0.92  Sodium 135 - 145 mmol/L 137 136 137  Potassium 3.5 - 5.1 mmol/L 3.8 4.1 4.1  Chloride 98 - 111 mmol/L 105 105 108  CO2 22 - 32 mmol/L 21(L) 29 22  Calcium 8.9 - 10.3 mg/dL 8.9 8.5(L) 8.4(L)  Total Protein 6.5 - 8.1 g/dL 6.7 6.1(L) 6.6  Total Bilirubin 0.3 - 1.2 mg/dL 0.7 0.2(L) 0.5  Alkaline Phos 38 - 126 U/L 52 54 66  AST 15 - 41 U/L 16 13(L) 18  ALT 0 - 44 U/L '14 12 15    '$ I have reviewed his scans independently and discussed with the patient.   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  Small cell lung cancer with liver metastasis: - Topotecan every 21 days started on 08/24/2017, dose reduced to 1.2 mg per metered square during cycle 4, frequency  changed every 4 weeks for better tolerability. -CT CAP on 03/07/2019 showed  stable pulmonary nodules, right lower lobe nodule measuring 1.5 x 1.8 x 1.4 cm.  Left lower lobe nodule measures 1.6 x 0.8 cm.  No evidence of metastatic disease in the abdomen or pelvis. -He is tolerating chemotherapy very well.  We reviewed his labs.  Hemoglobin is slightly down. -He will continue with Topotecan today. He will return to clinic 4 weeks with CT CAP.  2.  Chronic pain: - He will continue Percocet 1 and half tablets twice daily which is controlling pain.  3.  Peripheral neuropathy: - He has increasing neuropathy.  He is currently on gabapentin 300 mg 3 times daily. -I will increase it to 600 mg 3 times daily.  4.  Normocytic anemia: -This is chemotherapy-induced.  Last Feraheme was on 10/19/2017. -Hemoglobin today is 8.2.  I will check ferritin, iron panel, I37 and folic acid at next visit.  Total time spent is 25 minutes with more than 50% of the time spent face-to-face discussing treatment plan and coordination of care.    Orders placed this encounter:  Orders Placed This Encounter  Procedures   CT Chest W Contrast   CT Abdomen Pelvis W Contrast      Derek Jack, MD [place] [phone]

## 2019-05-09 NOTE — Progress Notes (Signed)
Labs reviewed with MD today. Proceed with treatment today per MD.  Treatment given per orders. Patient tolerated it well without problems. Vitals stable and discharged home from clinic ambulatory. Follow up as scheduled.

## 2019-05-09 NOTE — Assessment & Plan Note (Signed)
1.  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 -CT CAP on 03/07/2019 showed stable pulmonary nodules, right lower lobe nodule measuring 1.5 x 1.8 x 1.4 cm.  Left lower lobe nodule measures 1.6 x 0.8 cm.  No evidence of metastatic disease in the abdomen or pelvis. -He is tolerating chemotherapy very well.  We reviewed his labs.  Hemoglobin is slightly down. -He will continue with Topotecan today. He will return to clinic 4 weeks with CT CAP.  2.  Chronic pain: - He will continue Percocet 1 and half tablets twice daily which is controlling pain.  3.  Peripheral neuropathy: - He has increasing neuropathy.  He is currently on gabapentin 300 mg 3 times daily. -I will increase it to 600 mg 3 times daily.  4.  Normocytic anemia: -This is chemotherapy-induced.  Last Feraheme was on 10/19/2017. -Hemoglobin today is 8.2.  I will check ferritin, iron panel, I71 and folic acid at next visit.

## 2019-05-09 NOTE — Patient Instructions (Signed)
[facility]  Discharge Instructions:   _______________________________________________________________  Thank you for choosing  [facility] to provide your oncology and hematology care.  To afford each patient quality time with our providers, please arrive at least 15 minutes before your scheduled appointment.  You need to re-schedule your appointment if you arrive 10 or more minutes late.  We strive to give you quality time with our providers, and arriving late affects you and other patients whose appointments are after yours.  Also, if you no show three or more times for appointments you may be dismissed from the clinic.  Again, thank you for choosing  [facility]. Our hope is that these requests will allow you access to exceptional care and in a timely manner. _______________________________________________________________  If you have questions after your visit, please contact our office at [phone] between the hours of 8:30 a.m. and 5:00 p.m. Voicemails left after 4:30 p.m. will not be returned until the following business day. _______________________________________________________________  For prescription refill requests, have your pharmacy contact our office. _______________________________________________________________  Recommendations made by the consultant and any test results will be sent to your referring physician. _______________________________________________________________ 

## 2019-05-10 ENCOUNTER — Inpatient Hospital Stay (HOSPITAL_COMMUNITY): Payer: Medicare PPO

## 2019-05-10 VITALS — BP 112/45 | HR 51 | Temp 97.6°F | Resp 18

## 2019-05-10 DIAGNOSIS — Z5111 Encounter for antineoplastic chemotherapy: Secondary | ICD-10-CM | POA: Diagnosis not present

## 2019-05-10 DIAGNOSIS — C3432 Malignant neoplasm of lower lobe, left bronchus or lung: Secondary | ICD-10-CM | POA: Diagnosis not present

## 2019-05-10 DIAGNOSIS — D6481 Anemia due to antineoplastic chemotherapy: Secondary | ICD-10-CM | POA: Diagnosis not present

## 2019-05-10 DIAGNOSIS — C3431 Malignant neoplasm of lower lobe, right bronchus or lung: Secondary | ICD-10-CM | POA: Diagnosis not present

## 2019-05-10 DIAGNOSIS — C3490 Malignant neoplasm of unspecified part of unspecified bronchus or lung: Secondary | ICD-10-CM

## 2019-05-10 DIAGNOSIS — C787 Secondary malignant neoplasm of liver and intrahepatic bile duct: Secondary | ICD-10-CM | POA: Diagnosis not present

## 2019-05-10 MED ORDER — SODIUM CHLORIDE 0.9% FLUSH: 10.0000 mL | INTRAVENOUS | Status: DC | PRN

## 2019-05-10 MED ORDER — HEPARIN SOD (PORK) LOCK FLUSH 100 UNIT/ML IV SOLN
500.0000 [IU] | Freq: Once | INTRAVENOUS | Status: AC | PRN
  Administered 2019-05-10: 500 [IU]

## 2019-05-10 MED ORDER — TOPOTECAN HCL CHEMO INJECTION 4 MG
1.2000 mg/m2 | Freq: Once | INTRAVENOUS | Status: AC
  Administered 2019-05-10: 2.2 mg via INTRAVENOUS
  Filled 2019-05-10: qty 2.2

## 2019-05-10 MED ORDER — SODIUM CHLORIDE 0.9 % IV SOLN
Freq: Once | INTRAVENOUS | Status: AC
  Administered 2019-05-10: 13:00:00 via INTRAVENOUS

## 2019-05-10 MED ORDER — SODIUM CHLORIDE 0.9 % IV SOLN
Freq: Once | INTRAVENOUS | Status: AC
  Administered 2019-05-10: 13:00:00 via INTRAVENOUS
  Filled 2019-05-10: qty 2

## 2019-05-10 NOTE — Progress Notes (Signed)
Pt presents today for Day 2 Topotecan. VS stable. MAR reviewed. Pt has no complaints of any changes since the last visit. Pt has no complaints of any pain today.   Treatment given today per MD orders. Tolerated infusion without adverse affects. Vital signs stable. No complaints at this time. Discharged from clinic ambulatory. F/U with [facility] as scheduled.

## 2019-05-10 NOTE — Patient Instructions (Signed)
[facility] Discharge Instructions for Patients Receiving Chemotherapy  Today you received the following chemotherapy agents   To help prevent nausea and vomiting after your treatment, we encourage you to take your nausea medication   If you develop nausea and vomiting that is not controlled by your nausea medication, call the clinic.   BELOW ARE SYMPTOMS THAT SHOULD BE REPORTED IMMEDIATELY:  *FEVER GREATER THAN 100.5 F  *CHILLS WITH OR WITHOUT FEVER  NAUSEA AND VOMITING THAT IS NOT CONTROLLED WITH YOUR NAUSEA MEDICATION  *UNUSUAL SHORTNESS OF BREATH  *UNUSUAL BRUISING OR BLEEDING  TENDERNESS IN MOUTH AND THROAT WITH OR WITHOUT PRESENCE OF ULCERS  *URINARY PROBLEMS  *BOWEL PROBLEMS  UNUSUAL RASH Items with * indicate a potential emergency and should be followed up as soon as possible.  Feel free to call the clinic should you have any questions or concerns. The clinic phone number is [phone].  Please show the CHEMO ALERT CARD at check-in to the Emergency Department and triage nurse.   

## 2019-05-11 ENCOUNTER — Inpatient Hospital Stay (HOSPITAL_COMMUNITY): Payer: Medicare PPO

## 2019-05-11 ENCOUNTER — Other Ambulatory Visit: Payer: Self-pay

## 2019-05-11 VITALS — BP 117/45 | HR 54 | Temp 97.7°F | Resp 18

## 2019-05-11 DIAGNOSIS — C3490 Malignant neoplasm of unspecified part of unspecified bronchus or lung: Secondary | ICD-10-CM

## 2019-05-11 DIAGNOSIS — C3431 Malignant neoplasm of lower lobe, right bronchus or lung: Secondary | ICD-10-CM | POA: Diagnosis not present

## 2019-05-11 DIAGNOSIS — D6481 Anemia due to antineoplastic chemotherapy: Secondary | ICD-10-CM | POA: Diagnosis not present

## 2019-05-11 DIAGNOSIS — C3432 Malignant neoplasm of lower lobe, left bronchus or lung: Secondary | ICD-10-CM | POA: Diagnosis not present

## 2019-05-11 DIAGNOSIS — Z5111 Encounter for antineoplastic chemotherapy: Secondary | ICD-10-CM | POA: Diagnosis not present

## 2019-05-11 DIAGNOSIS — C787 Secondary malignant neoplasm of liver and intrahepatic bile duct: Secondary | ICD-10-CM | POA: Diagnosis not present

## 2019-05-11 MED ORDER — SODIUM CHLORIDE 0.9 % IV SOLN
Freq: Once | INTRAVENOUS | Status: AC
  Administered 2019-05-11: 13:00:00 via INTRAVENOUS
  Filled 2019-05-11: qty 2

## 2019-05-11 MED ORDER — TOPOTECAN HCL CHEMO INJECTION 4 MG
1.2000 mg/m2 | Freq: Once | INTRAVENOUS | Status: AC
  Administered 2019-05-11: 14:00:00 2.2 mg via INTRAVENOUS
  Filled 2019-05-11: qty 2.2

## 2019-05-11 MED ORDER — HEPARIN SOD (PORK) LOCK FLUSH 100 UNIT/ML IV SOLN
500.0000 [IU] | Freq: Once | INTRAVENOUS | Status: AC | PRN
  Administered 2019-05-11: 500 [IU]

## 2019-05-11 MED ORDER — SODIUM CHLORIDE 0.9% FLUSH
10.0000 mL | INTRAVENOUS | Status: DC | PRN
  Administered 2019-05-11 (×2): 10 mL
  Filled 2019-05-11 (×2): qty 10

## 2019-05-11 MED ORDER — SODIUM CHLORIDE 0.9 % IV SOLN
Freq: Once | INTRAVENOUS | Status: AC
  Administered 2019-05-11: 13:00:00 via INTRAVENOUS

## 2019-05-11 NOTE — Progress Notes (Signed)
Treatment given today per MD orders. Tolerated infusion without adverse affects. Vital signs stable. No complaints at this time. Discharged from clinic ambulatory. F/U with [facility] as scheduled.   

## 2019-05-11 NOTE — Patient Instructions (Signed)
[facility] Discharge Instructions for Patients Receiving Chemotherapy  Today you received the following chemotherapy agents   To help prevent nausea and vomiting after your treatment, we encourage you to take your nausea medication   If you develop nausea and vomiting that is not controlled by your nausea medication, call the clinic.   BELOW ARE SYMPTOMS THAT SHOULD BE REPORTED IMMEDIATELY:  *FEVER GREATER THAN 100.5 F  *CHILLS WITH OR WITHOUT FEVER  NAUSEA AND VOMITING THAT IS NOT CONTROLLED WITH YOUR NAUSEA MEDICATION  *UNUSUAL SHORTNESS OF BREATH  *UNUSUAL BRUISING OR BLEEDING  TENDERNESS IN MOUTH AND THROAT WITH OR WITHOUT PRESENCE OF ULCERS  *URINARY PROBLEMS  *BOWEL PROBLEMS  UNUSUAL RASH Items with * indicate a potential emergency and should be followed up as soon as possible.  Feel free to call the clinic should you have any questions or concerns. The clinic phone number is [phone].  Please show the CHEMO ALERT CARD at check-in to the Emergency Department and triage nurse.   

## 2019-05-12 ENCOUNTER — Inpatient Hospital Stay (HOSPITAL_COMMUNITY): Payer: Medicare PPO

## 2019-05-12 VITALS — BP 119/48 | HR 50 | Temp 96.8°F | Resp 18

## 2019-05-12 DIAGNOSIS — D6481 Anemia due to antineoplastic chemotherapy: Secondary | ICD-10-CM | POA: Diagnosis not present

## 2019-05-12 DIAGNOSIS — Z5111 Encounter for antineoplastic chemotherapy: Secondary | ICD-10-CM | POA: Diagnosis not present

## 2019-05-12 DIAGNOSIS — C787 Secondary malignant neoplasm of liver and intrahepatic bile duct: Secondary | ICD-10-CM | POA: Diagnosis not present

## 2019-05-12 DIAGNOSIS — C3432 Malignant neoplasm of lower lobe, left bronchus or lung: Secondary | ICD-10-CM | POA: Diagnosis not present

## 2019-05-12 DIAGNOSIS — C3431 Malignant neoplasm of lower lobe, right bronchus or lung: Secondary | ICD-10-CM | POA: Diagnosis not present

## 2019-05-12 DIAGNOSIS — C3490 Malignant neoplasm of unspecified part of unspecified bronchus or lung: Secondary | ICD-10-CM

## 2019-05-12 MED ORDER — TOPOTECAN HCL CHEMO INJECTION 4 MG
1.2000 mg/m2 | Freq: Once | INTRAVENOUS | Status: AC
  Administered 2019-05-12: 2.2 mg via INTRAVENOUS
  Filled 2019-05-12: qty 2.2

## 2019-05-12 MED ORDER — SODIUM CHLORIDE 0.9 % IV SOLN
Freq: Once | INTRAVENOUS | Status: AC
  Administered 2019-05-12: 13:00:00 via INTRAVENOUS

## 2019-05-12 MED ORDER — SODIUM CHLORIDE 0.9% FLUSH
10.0000 mL | INTRAVENOUS | Status: DC | PRN
  Administered 2019-05-12: 13:00:00 10 mL
  Filled 2019-05-12: qty 10

## 2019-05-12 MED ORDER — HEPARIN SOD (PORK) LOCK FLUSH 100 UNIT/ML IV SOLN
500.0000 [IU] | Freq: Once | INTRAVENOUS | Status: AC | PRN
  Administered 2019-05-12: 500 [IU]

## 2019-05-12 MED ORDER — SODIUM CHLORIDE 0.9 % IV SOLN
Freq: Once | INTRAVENOUS | Status: AC
  Administered 2019-05-12: 13:00:00 via INTRAVENOUS
  Filled 2019-05-12: qty 2

## 2019-05-12 NOTE — Progress Notes (Signed)
Pt presents today for Day 4 Topotecan. VS within parameters for tx. No changes per patient since the last visit. Pt denies any pain today.

## 2019-05-12 NOTE — Progress Notes (Signed)
Treatment given per orders. Patient tolerated it well without problems. Vitals stable and discharged home from clinic ambulatory. Follow up as scheduled.  

## 2019-05-13 ENCOUNTER — Other Ambulatory Visit: Payer: Self-pay

## 2019-05-13 ENCOUNTER — Inpatient Hospital Stay (HOSPITAL_COMMUNITY): Payer: Medicare PPO

## 2019-05-13 VITALS — BP 129/43 | HR 57 | Temp 97.5°F | Resp 18

## 2019-05-13 DIAGNOSIS — D6481 Anemia due to antineoplastic chemotherapy: Secondary | ICD-10-CM | POA: Diagnosis not present

## 2019-05-13 DIAGNOSIS — Z5111 Encounter for antineoplastic chemotherapy: Secondary | ICD-10-CM | POA: Diagnosis not present

## 2019-05-13 DIAGNOSIS — C787 Secondary malignant neoplasm of liver and intrahepatic bile duct: Secondary | ICD-10-CM | POA: Diagnosis not present

## 2019-05-13 DIAGNOSIS — C3431 Malignant neoplasm of lower lobe, right bronchus or lung: Secondary | ICD-10-CM | POA: Diagnosis not present

## 2019-05-13 DIAGNOSIS — C3490 Malignant neoplasm of unspecified part of unspecified bronchus or lung: Secondary | ICD-10-CM

## 2019-05-13 DIAGNOSIS — C3432 Malignant neoplasm of lower lobe, left bronchus or lung: Secondary | ICD-10-CM | POA: Diagnosis not present

## 2019-05-13 MED ORDER — PEGFILGRASTIM 6 MG/0.6ML ~~LOC~~ PSKT
PREFILLED_SYRINGE | SUBCUTANEOUS | Status: AC
  Filled 2019-05-13: qty 0.6

## 2019-05-13 MED ORDER — SODIUM CHLORIDE 0.9 % IV SOLN
Freq: Once | INTRAVENOUS | Status: DC
  Filled 2019-05-13: qty 2

## 2019-05-13 MED ORDER — PEGFILGRASTIM 6 MG/0.6ML ~~LOC~~ PSKT
6.0000 mg | PREFILLED_SYRINGE | Freq: Once | SUBCUTANEOUS | Status: AC
  Administered 2019-05-13: 13:00:00 6 mg via SUBCUTANEOUS

## 2019-05-13 MED ORDER — SODIUM CHLORIDE 0.9 % IV SOLN
Freq: Once | INTRAVENOUS | Status: AC
  Administered 2019-05-13: 11:00:00 via INTRAVENOUS

## 2019-05-13 MED ORDER — TOPOTECAN HCL CHEMO INJECTION 4 MG
1.2000 mg/m2 | Freq: Once | INTRAVENOUS | Status: AC
  Administered 2019-05-13: 12:00:00 2.2 mg via INTRAVENOUS
  Filled 2019-05-13: qty 2.2

## 2019-05-13 MED ORDER — SODIUM CHLORIDE 0.9 % IV SOLN
Freq: Once | INTRAVENOUS | Status: AC
  Administered 2019-05-13: 11:00:00 via INTRAVENOUS
  Filled 2019-05-13: qty 2

## 2019-05-13 MED ORDER — HEPARIN SOD (PORK) LOCK FLUSH 100 UNIT/ML IV SOLN
500.0000 [IU] | Freq: Once | INTRAVENOUS | Status: AC | PRN
  Administered 2019-05-13: 500 [IU]

## 2019-05-13 MED ORDER — SODIUM CHLORIDE 0.9% FLUSH
10.0000 mL | INTRAVENOUS | Status: DC | PRN
  Administered 2019-05-13: 13:00:00 10 mL
  Filled 2019-05-13: qty 10

## 2019-05-13 NOTE — Patient Instructions (Signed)
[facility] Discharge Instructions for Patients Receiving Chemotherapy  Today you received the following chemotherapy agents   To help prevent nausea and vomiting after your treatment, we encourage you to take your nausea medication   If you develop nausea and vomiting that is not controlled by your nausea medication, call the clinic.   BELOW ARE SYMPTOMS THAT SHOULD BE REPORTED IMMEDIATELY:  *FEVER GREATER THAN 100.5 F  *CHILLS WITH OR WITHOUT FEVER  NAUSEA AND VOMITING THAT IS NOT CONTROLLED WITH YOUR NAUSEA MEDICATION  *UNUSUAL SHORTNESS OF BREATH  *UNUSUAL BRUISING OR BLEEDING  TENDERNESS IN MOUTH AND THROAT WITH OR WITHOUT PRESENCE OF ULCERS  *URINARY PROBLEMS  *BOWEL PROBLEMS  UNUSUAL RASH Items with * indicate a potential emergency and should be followed up as soon as possible.  Feel free to call the clinic should you have any questions or concerns. The clinic phone number is [phone].  Please show the CHEMO ALERT CARD at check-in to the Emergency Department and triage nurse.   

## 2019-05-13 NOTE — Progress Notes (Signed)
Pt presents today for Day 5 Topotecan. VSS. Pt has no complaints of any changes since the last visit.   Treatment given today per MD orders. Tolerated infusion without adverse affects. Vital signs stable. No complaints at this time. Schedule given to patient. Highlighted dates for CT and f/u appointments with Dr. Delton Coombes. Understanding verbalized.  Discharged from clinic ambulatory. F/U with [facility] as scheduled.

## 2019-05-25 ENCOUNTER — Other Ambulatory Visit: Payer: Self-pay | Admitting: Family Medicine

## 2019-05-25 DIAGNOSIS — F411 Generalized anxiety disorder: Secondary | ICD-10-CM

## 2019-05-25 DIAGNOSIS — F5101 Primary insomnia: Secondary | ICD-10-CM

## 2019-05-25 NOTE — Telephone Encounter (Signed)
Patient would like his alprazolam called into [place]. He would like to know if Dr. Dennard Schaumann could send in with refills so he didn't have to call each month.  CB# [phone]

## 2019-05-25 NOTE — Telephone Encounter (Signed)
Pt is requesting refill on Xanax  (I put 2 refills on the RX - if you don't want him to have please take off)   LOV:  11/27/18  LRF:  04/26/19

## 2019-05-26 MED ORDER — ALPRAZOLAM 1 MG PO TABS: 1.0000 mg | ORAL_TABLET | Freq: Four times a day (QID) | ORAL | 2 refills | Status: DC | PRN

## 2019-05-27 ENCOUNTER — Other Ambulatory Visit (HOSPITAL_COMMUNITY): Payer: Self-pay | Admitting: Hematology

## 2019-05-27 DIAGNOSIS — C3490 Malignant neoplasm of unspecified part of unspecified bronchus or lung: Secondary | ICD-10-CM

## 2019-06-06 DIAGNOSIS — H2511 Age-related nuclear cataract, right eye: Secondary | ICD-10-CM | POA: Diagnosis not present

## 2019-06-06 DIAGNOSIS — H25812 Combined forms of age-related cataract, left eye: Secondary | ICD-10-CM | POA: Diagnosis not present

## 2019-06-07 ENCOUNTER — Other Ambulatory Visit: Payer: Self-pay

## 2019-06-07 ENCOUNTER — Ambulatory Visit (HOSPITAL_COMMUNITY)
Admission: RE | Admit: 2019-06-07 | Discharge: 2019-06-07 | Disposition: A | Discharge status: Home / Self Care | Payer: Medicare PPO | Source: Ambulatory Visit | Attending: Hematology | Admitting: Hematology

## 2019-06-07 DIAGNOSIS — C3490 Malignant neoplasm of unspecified part of unspecified bronchus or lung: Secondary | ICD-10-CM | POA: Diagnosis not present

## 2019-06-07 DIAGNOSIS — Z5111 Encounter for antineoplastic chemotherapy: Secondary | ICD-10-CM | POA: Diagnosis not present

## 2019-06-07 MED ORDER — IOHEXOL 300 MG/ML  SOLN
100.0000 mL | Freq: Once | INTRAMUSCULAR | Status: AC | PRN
  Administered 2019-06-07: 100 mL via INTRAVENOUS

## 2019-06-13 ENCOUNTER — Inpatient Hospital Stay (HOSPITAL_COMMUNITY): Payer: Medicare PPO

## 2019-06-13 ENCOUNTER — Other Ambulatory Visit: Payer: Self-pay

## 2019-06-13 ENCOUNTER — Encounter (HOSPITAL_COMMUNITY): Payer: Self-pay | Admitting: Hematology

## 2019-06-13 ENCOUNTER — Inpatient Hospital Stay (HOSPITAL_COMMUNITY): Payer: Medicare PPO | Attending: Hematology | Admitting: Hematology

## 2019-06-13 VITALS — BP 117/48 | HR 58 | Temp 97.1°F | Resp 18 | Wt 144.4 lb

## 2019-06-13 VITALS — BP 106/48 | HR 50 | Temp 96.2°F | Resp 18

## 2019-06-13 DIAGNOSIS — C3490 Malignant neoplasm of unspecified part of unspecified bronchus or lung: Secondary | ICD-10-CM

## 2019-06-13 DIAGNOSIS — C770 Secondary and unspecified malignant neoplasm of lymph nodes of head, face and neck: Secondary | ICD-10-CM | POA: Diagnosis not present

## 2019-06-13 DIAGNOSIS — D6481 Anemia due to antineoplastic chemotherapy: Secondary | ICD-10-CM | POA: Insufficient documentation

## 2019-06-13 DIAGNOSIS — Z5111 Encounter for antineoplastic chemotherapy: Secondary | ICD-10-CM | POA: Diagnosis not present

## 2019-06-13 DIAGNOSIS — C3431 Malignant neoplasm of lower lobe, right bronchus or lung: Secondary | ICD-10-CM | POA: Insufficient documentation

## 2019-06-13 DIAGNOSIS — C787 Secondary malignant neoplasm of liver and intrahepatic bile duct: Secondary | ICD-10-CM | POA: Insufficient documentation

## 2019-06-13 DIAGNOSIS — C3432 Malignant neoplasm of lower lobe, left bronchus or lung: Secondary | ICD-10-CM | POA: Diagnosis not present

## 2019-06-13 LAB — LACTATE DEHYDROGENASE: LDH: 147 U/L (ref 98–192)

## 2019-06-13 LAB — COMPREHENSIVE METABOLIC PANEL
ALT: 13 U/L (ref 0–44)
AST: 17 U/L (ref 15–41)
Albumin: 4.1 g/dL (ref 3.5–5.0)
Alkaline Phosphatase: 52 U/L (ref 38–126)
Anion gap: 10 (ref 5–15)
BUN: 18 mg/dL (ref 8–23)
CO2: 23 mmol/L (ref 22–32)
Calcium: 8.7 mg/dL — ABNORMAL LOW (ref 8.9–10.3)
Chloride: 104 mmol/L (ref 98–111)
Creatinine, Ser: 0.93 mg/dL (ref 0.61–1.24)
GFR calc Af Amer: >60 mL/min (ref >60)
GFR calc non Af Amer: >60 mL/min (ref >60)
Glucose, Bld: 88 mg/dL (ref 70–99)
Potassium: 3.7 mmol/L (ref 3.5–5.1)
Sodium: 137 mmol/L (ref 135–145)
Total Bilirubin: 0.5 mg/dL (ref 0.3–1.2)
Total Protein: 6.8 g/dL (ref 6.5–8.1)

## 2019-06-13 LAB — CBC WITH DIFFERENTIAL/PLATELET
Abs Immature Granulocytes: 0.02 10*3/uL (ref 0.00–0.07)
Basophils Absolute: 0 10*3/uL (ref 0.0–0.1)
Basophils Relative: 1 %
Eosinophils Absolute: 0.1 10*3/uL (ref 0.0–0.5)
Eosinophils Relative: 2 %
HCT: 28.4 % — ABNORMAL LOW (ref 39.0–52.0)
Hemoglobin: 8.8 g/dL — ABNORMAL LOW (ref 13.0–17.0)
Immature Granulocytes: 0 %
Lymphocytes Relative: 24 %
Lymphs Abs: 1.5 10*3/uL (ref 0.7–4.0)
MCH: 30.8 pg (ref 26.0–34.0)
MCHC: 31 g/dL (ref 30.0–36.0)
MCV: 99.3 fL (ref 80.0–100.0)
Monocytes Absolute: 0.6 10*3/uL (ref 0.1–1.0)
Monocytes Relative: 10 %
Neutro Abs: 4 10*3/uL (ref 1.7–7.7)
Neutrophils Relative %: 63 %
Platelets: 256 10*3/uL (ref 150–400)
RBC: 2.86 MIL/uL — ABNORMAL LOW (ref 4.22–5.81)
RDW: 22 % — ABNORMAL HIGH (ref 11.5–15.5)
WBC: 6.3 10*3/uL (ref 4.0–10.5)
nRBC: 0 % (ref 0.0–0.2)

## 2019-06-13 MED ORDER — SODIUM CHLORIDE 0.9 % IV SOLN
Freq: Once | INTRAVENOUS | Status: AC
  Administered 2019-06-13: 10:00:00 via INTRAVENOUS

## 2019-06-13 MED ORDER — TOPOTECAN HCL CHEMO INJECTION 4 MG
1.2000 mg/m2 | Freq: Once | INTRAVENOUS | Status: AC
  Administered 2019-06-13: 2.2 mg via INTRAVENOUS
  Filled 2019-06-13: qty 2.2

## 2019-06-13 MED ORDER — SODIUM CHLORIDE 0.9 % IV SOLN
Freq: Once | INTRAVENOUS | Status: AC
  Administered 2019-06-13: 10:00:00 via INTRAVENOUS
  Filled 2019-06-13: qty 2

## 2019-06-13 MED ORDER — HEPARIN SOD (PORK) LOCK FLUSH 100 UNIT/ML IV SOLN
500.0000 [IU] | Freq: Once | INTRAVENOUS | Status: AC | PRN
  Administered 2019-06-13: 500 [IU]

## 2019-06-13 MED ORDER — SODIUM CHLORIDE 0.9% FLUSH
10.0000 mL | INTRAVENOUS | Status: DC | PRN
Start: 2019-06-13 — End: 2019-06-16
  Administered 2019-06-13: 10 mL
  Filled 2019-06-13: qty 10

## 2019-06-13 NOTE — Progress Notes (Signed)
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 Bulky 5.4 cm right supraclavicular region malignant lymph node conglomeration with extracapsular extension. 2. Surrounding smaller abnormal right level 3 and level 5 lymph nodes, and the lymphadenopathy continues into the superior mediastinum, see Chest CT findings reported separately. 3. No other metastatic disease identified in the neck.   01/16/2017 Imaging   CT chest: IMPRESSION: 1. Extensive lymphadenopathy in the thorax and lower right cervical region, as discussed above. Primary differential considerations include lymphoma/leukemia or small cell carcinoma of the lung. Further evaluation a PET-CT could be considered to assess for additional sites of disease below the diaphragm if clinically appropriate. Additionally, ultrasound-guided biopsy of supraclavicular lymphadenopathy could be considered to establish a tissue diagnosis. 2. Indeterminate lesion in the periphery of segment 8 of the liver measuring 2.7 x 1.7 cm. Attention at time of follow-up PET-CT is recommended. 3. Aortic atherosclerosis, in addition to left main and 3 vessel coronary artery disease. Please note that although the presence of coronary artery calcium documents the presence of coronary artery disease, the severity of this disease and any potential stenosis cannot be assessed on this non-gated CT examination. Assessment for potential risk factor modification, dietary therapy or pharmacologic therapy may be warranted, if clinically indicated. 4. There are calcifications of the aortic valve.  Echocardiographic correlation for evaluation of potential valvular dysfunction may be warranted if clinically indicated. 5. Diffuse bronchial wall thickening with moderate centrilobular and paraseptal emphysema; imaging findings suggestive of underlying COPD.   02/03/2017 Initial Biopsy   (R) neck lymph node biopsy: SMALL CELL CARCINOMA (most likely lung primary).    02/03/2017 Miscellaneous   Port-a-cath attempted by IR; unable to place d/t enlarged SVC.    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 Staging for metastatic disease  EXAM: MRI HEAD WITHOUT AND WITH CONTRAST  TECHNIQUE: Multiplanar, multiecho pulse sequences of the brain and surrounding structures were obtained without and with intravenous contrast.  CONTRAST:  38m MULTIHANCE GADOBENATE DIMEGLUMINE 529 MG/ML IV SOLN  COMPARISON:  None.  FINDINGS: Brain: Negative for hydrocephalus. Cerebral volume normal for age. Small nonenhancing white matter hyperintensities consistent with mild chronic microvascular ischemia. No acute infarct. Negative for hemorrhage or mass or edema  Normal enhancement postcontrast infusion. No enhancing mass lesion. Leptomeningeal enhancement is normal.  Vascular: Normal arterial flow voids.  Normal venous enhancement  Skull and upper cervical spine: Negative  Sinuses/Orbits: Negative  Other: None  IMPRESSION: Negative for metastatic disease.  No acute abnormality.  Mild chronic white matter changes.   04/07/2017 Imaging    PET:  1. Marked reduction in size and metabolic activity of bulky RIGHT supraclavicular adenopathy mediastinal lymphadenopathy. 2. Residual moderate activity remains within small RIGHT supraclavicular lymph node, RIGHT lower paratracheal  lymph node and RIGHT hilar lymph node. 3. Resolution of prevascular and internal mammary mediastinal metastatic hypermetabolic activity. 4. Resolution of metabolic activity associated with solitary RIGHT  hepatic lobe liver metastasis. 5. No evidence of disease progression. 6. 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   05/27/2017 Imaging   MRI brain w/ and w/o contrast IMPRESSION: 1. No metastatic disease identified. 2. Increased nonspecific cerebral white matter signal changes since August. These are most commonly small vessel disease related. 3. New right maxillary sinusitis. Benign appearing retention cysts in the nasopharynx with trace mastoid effusions.   06/12/2017 Imaging   PET-CT IMPRESSION: 1. There are two new hypermetabolic nodules identified within both lower lobes measuring up to 3.1 cm. The appearance is nonspecific and may be inflammatory/infectious in etiology. Pulmonary metastatic disease cannot be excluded and short-term follow-up imaging in 3 months is advised to reassess these nodules. 2. Stable appearance of mild hypermetabolic activity associated with right paratracheal and right hilar lymph nodes. 3. Decrease in FDG uptake associated with index right supraclavicular lymph node. 4. 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 5. Aortic Atherosclerosis (ICD10-I70.0) and Emphysema (ICD10-J43.9).   08/24/2017 -  Chemotherapy   The patient had pegfilgrastim (NEULASTA ONPRO [place]) injection 6 mg, 6 mg, Subcutaneous, Once, 25 of 25 cycles Administration: 6 mg (08/28/2017), 6 mg (09/18/2017), 6 mg (10/09/2017), 6 mg (11/02/2017), 6 mg (11/20/2017), 6 mg (12/18/2017), 6 mg (01/15/2018), 6 mg (02/12/2018), 6 mg (03/12/2018), 6 mg (04/09/2018), 6 mg (05/07/2018), 6 mg (06/04/2018), 6 mg  (07/02/2018), 6 mg (07/30/2018), 6 mg (08/27/2018), 6 mg (09/24/2018), 6 mg (10/29/2018), 6 mg (11/26/2018), 6 mg (12/24/2018), 6 mg (01/21/2019), 6 mg (02/18/2019), 6 mg (03/18/2019), 6 mg (04/15/2019), 6 mg (05/13/2019) topotecan (HYCAMTIN) 2.9 mg in sodium chloride 0.9 % 100 mL chemo infusion, 1.5 mg/m2 = 2.9 mg, Intravenous,  Once, 25 of 25 cycles Dose modification: 1.2 mg/m2 (80 % of original dose 1.5 mg/m2, Cycle 4, Reason: Dose Not Tolerated) Administration: 2.9 mg (08/24/2017), 2.9 mg (08/25/2017), 2.9 mg (08/28/2017), 2.9 mg (08/26/2017), 2.9 mg (08/27/2017), 2.9 mg (09/14/2017), 2.9 mg (09/15/2017), 2.9 mg (09/16/2017), 2.9 mg (09/17/2017), 2.9 mg (09/18/2017), 2.9 mg (10/05/2017), 2.9 mg (10/06/2017), 2.9 mg (10/07/2017), 2.9 mg (10/08/2017), 2.9 mg (10/09/2017), 2.3 mg (10/26/2017), 2.3 mg (10/27/2017), 2.3 mg (10/28/2017), 2.3 mg (10/29/2017), 2.3 mg (11/02/2017), 2.3 mg (11/16/2017), 2.3 mg (11/17/2017), 2.3 mg (11/18/2017), 2.3 mg (11/19/2017), 2.3 mg (11/20/2017), 2.3 mg (12/14/2017), 2.3 mg (12/15/2017), 2.3 mg (12/16/2017), 2.3 mg (12/17/2017), 2.3 mg (12/18/2017), 2.3 mg (01/11/2018), 2.3 mg (01/12/2018), 2.3 mg (01/13/2018), 2.3 mg (01/15/2018), 2.3 mg (02/08/2018), 2.3 mg (02/09/2018), 2.3 mg (02/10/2018), 2.3 mg (02/11/2018), 2.3 mg (02/12/2018), 2.3 mg (03/08/2018), 2.3 mg (03/09/2018), 2.3 mg (03/10/2018), 2.3 mg (03/11/2018), 2.3 mg (03/12/2018), 2.2 mg (04/05/2018), 2.2 mg (04/06/2018), 2.2 mg (04/07/2018), 2.2 mg (04/08/2018), 2.2 mg (04/09/2018), 2.2 mg (05/03/2018), 2.2 mg (05/04/2018), 2.2 mg (05/05/2018), 2.2 mg (05/06/2018), 2.2 mg (05/07/2018), 2.2 mg (05/31/2018), 2.2 mg (06/01/2018), 2.2 mg (06/02/2018), 2.2 mg (06/03/2018), 2.2 mg (06/04/2018), 2.2 mg (06/28/2018), 2.2 mg (06/29/2018), 2.2 mg (06/30/2018), 2.2 mg (07/01/2018), 2.2 mg (07/02/2018), 2.2 mg (07/26/2018), 2.2 mg (07/27/2018), 2.2 mg (07/28/2018), 2.2 mg (07/29/2018), 2.2 mg (07/30/2018), 2.2 mg (08/23/2018), 2.2 mg (08/24/2018), 2.2 mg (08/25/2018), 2.2 mg (08/26/2018), 2.2 mg (08/27/2018), 2.2 mg  (09/20/2018), 2.2 mg (09/21/2018), 2.2 mg (09/22/2018), 2.2 mg (09/23/2018), 2.2 mg (09/24/2018), 2.2 mg (10/25/2018), 2.2 mg (10/26/2018), 2.2 mg (10/27/2018), 2.2 mg (10/28/2018), 2.2 mg (10/29/2018), 2.2 mg (11/22/2018), 2.2 mg (11/23/2018), 2.2 mg (11/24/2018), 2.2 mg (11/25/2018),  2.2 mg (11/26/2018), 2.2 mg (12/20/2018), 2.2 mg (12/21/2018), 2.2 mg (12/22/2018), 2.2 mg (12/23/2018), 2.2 mg (12/24/2018), 2.2 mg (01/17/2019), 2.2 mg (01/18/2019), 2.2 mg (01/19/2019), 2.2 mg (01/20/2019), 2.2 mg (01/21/2019), 2.2 mg (02/14/2019), 2.2 mg (02/15/2019), 2.2 mg (02/16/2019), 2.2 mg (02/17/2019), 2.2 mg (02/18/2019), 2.2 mg (03/14/2019), 2.2 mg (03/15/2019), 2.2 mg (03/16/2019), 2.2 mg (03/17/2019), 2.2 mg (03/18/2019), 2.2 mg (04/11/2019), 2.2 mg (04/12/2019), 2.2 mg (04/13/2019), 2.2 mg (04/14/2019), 2.2 mg (04/15/2019), 2.2 mg (05/09/2019), 2.2 mg (05/10/2019), 2.2 mg (05/11/2019), 2.2 mg (05/12/2019), 2.2 mg (05/13/2019), 2.2 mg (06/13/2019) ondansetron (ZOFRAN) 4 mg in sodium chloride 0.9 % 50 mL IVPB, , Intravenous,  Once, 20 of 20 cycles Administration:  (02/08/2018),  (02/09/2018),  (02/10/2018),  (02/11/2018),  (02/12/2018),  (03/08/2018),  (03/09/2018),  (03/10/2018),  (03/11/2018),  (03/12/2018),  (04/05/2018),  (04/06/2018),  (04/07/2018),  (04/08/2018),  (04/09/2018),  (05/03/2018),  (05/04/2018),  (05/05/2018),  (05/06/2018),  (05/07/2018),  (05/31/2018),  (06/01/2018),  (06/02/2018),  (06/03/2018),  (06/04/2018),  (06/28/2018),  (06/29/2018),  (06/30/2018),  (07/01/2018),  (07/02/2018),  (07/26/2018),  (07/27/2018),  (07/28/2018),  (07/29/2018),  (07/30/2018),  (08/23/2018),  (08/24/2018),  (08/25/2018),  (08/26/2018),  (08/27/2018),  (09/20/2018),  (09/21/2018),  (09/22/2018),  (09/23/2018),  (09/24/2018),  (10/25/2018),  (10/26/2018),  (10/27/2018),  (10/28/2018),  (10/29/2018),  (11/22/2018),  (11/23/2018),  (11/24/2018),  (11/25/2018),  (11/26/2018),  (12/20/2018),  (12/21/2018),  (12/22/2018),  (12/23/2018),  (12/24/2018),  (01/17/2019),  (01/18/2019),  (01/19/2019),  (01/20/2019),  (01/21/2019),  (02/14/2019),   (02/15/2019),  (02/16/2019),  (02/17/2019),  (02/18/2019),  (03/14/2019),  (03/15/2019),  (03/16/2019),  (03/17/2019),  (03/18/2019),  (04/11/2019),  (04/12/2019),  (04/13/2019),  (04/14/2019),  (04/15/2019),  (05/09/2019),  (05/10/2019),  (05/11/2019),  (05/12/2019),  (05/13/2019),  (06/13/2019)  for chemotherapy treatment.         INTERVAL HISTORY:  Mr. Derego 68 y.o. male seen for follow-up of small cell lung cancer and chemotherapy and toxicity assessment.  Appetite is 25%.  Energy levels are 50%.  Chronic cough has been stable.  Intermittent dizziness is also stable.  Denies any nausea, vomiting, diarrhea or constipation.  Denies any vision changes.  Numbness in the feet has been stable.  He is taking gabapentin 3 times a day.  REVIEW OF SYSTEMS:  Review of Systems  Respiratory: Positive for cough.   Neurological: Positive for headaches and numbness.  All other systems reviewed and are negative.    PAST MEDICAL/SURGICAL HISTORY:  Past Medical History:  Diagnosis Date   Anxiety    CAD (coronary artery disease)    Cancer (HCC)    stage 4 small cell lung cancer    COPD (chronic obstructive pulmonary disease) (HCC)    Depression    Dyspnea    increased exertion   Feeling of chest tightness    Heart palpitations    History of chemotherapy    Myocardial infarction ([place])    Osteopenia    Panic attacks    Smoker    Past Surgical History:  Procedure Laterality Date   BACK SURGERY  12/24/2000   L5,S1   CORONARY STENT PLACEMENT  2005   RCA & CX   HERNIA REPAIR Right 1980's   INGUINAL HERNIA REPAIR  12/1978   right side   IR FLUORO GUIDE PORT INSERTION RIGHT  04/02/2017   IR US GUIDE BX ASP/DRAIN  02/03/2017   IR US GUIDE VASC ACCESS RIGHT  04/02/2017   NM MYOCAR PERF WALL MOTION  09/07/2009   No ischemia; EF 51%   SHOULDER SURGERY Left 08/2010   SPINE SURGERY  2002   L5-S1     SOCIAL HISTORY:  Social History   Socioeconomic History   Marital status: Single     Spouse name: Not on file   Number of children: Not on file   Years of education: Not on file   Highest education level: Not on file  Occupational History   Not on file  Social Needs   Financial resource strain: Not on file   Food insecurity    Worry: Not on file    Inability: Not on file   Transportation needs    Medical: Not on file    Non-medical: Not on file  Tobacco Use   Smoking status: Current Every Day Smoker    Packs/day: 1.00    Types: Cigarettes   Smokeless tobacco: Never Used  Substance and Sexual Activity   Alcohol use: Yes    Comment: occas   Drug use: No   Sexual activity: Not on file  Lifestyle   Physical activity    Days per week: Not on file    Minutes per session: Not on file   Stress: Not on file  Relationships   Social connections    Talks on phone: Not on file    Gets together: Not on file    Attends religious service: Not on file    Active member of club or organization: Not on file    Attends meetings of clubs or organizations: Not on file    Relationship status: Not on file   Intimate partner violence    Fear of current or ex partner: Not on file    Emotionally abused: Not on file    Physically abused: Not on file    Forced sexual activity: Not on file  Other Topics Concern   Not on file  Social History Narrative   Not on file    FAMILY HISTORY:  Family History  Problem Relation Age of Onset   Heart attack Father    Kidney disease Father        renal failure   Heart failure Mother    Heart attack Mother    Cancer Brother    Diabetes Brother    Alcohol abuse Brother    Diabetes Sister     CURRENT MEDICATIONS:  Outpatient Encounter Medications as of 06/13/2019  Medication Sig   albuterol (VENTOLIN HFA) 108 (90 Base) MCG/ACT inhaler Inhale 2 puffs into the lungs every 6 (six) hours as needed for wheezing or shortness of breath.   ALPRAZolam (XANAX) 1 MG tablet Take 1 tablet (1 mg total) by mouth 4  (four) times daily as needed for anxiety.   aspirin EC 81 MG tablet Take 81 mg by mouth daily.   atorvastatin (LIPITOR) 40 MG tablet TAKE (1) TABLET BY MOUTH ONCE DAILY.   BESIVANCE 0.6 % SUSP Pt has not started yet   cholecalciferol (VITAMIN D3) 25 MCG (1000 UT) tablet Take 1,000 Units by mouth daily.   clotrimazole-betamethasone (LOTRISONE) cream Apply 1 application topically 2 (two) times daily.   gabapentin (NEURONTIN) 300 MG capsule Take 2 capsules (600 mg total) by mouth 3 (three) times daily.   hydrOXYzine (ATARAX/VISTARIL) 50 MG tablet TAKE ONE TABLET BY MOUTH AT BEDTIME AS NEEDED FOR SLEEP. (Patient not taking: Reported on 05/09/2019)   LOTEMAX 0.5 % ophthalmic suspension Place 1 drop into the left eye 4 (four) times daily. Pt has not started yet   ondansetron (ZOFRAN) 8 MG tablet Take 1 tablet (8 mg total) by mouth  2 (two) times daily as needed. Start on the third day after cisplatin chemotherapy. (Patient not taking: Reported on 05/09/2019)   oxyCODONE-acetaminophen (PERCOCET/ROXICET) 5-325 MG tablet TAKE 1 TABLET EVERY 8 HOURS AS NEEDED FOR SEVERE PAIN.   Pegfilgrastim (NEULASTA ONPRO [place]) Inject into the skin. Every 21 days   polyethylene glycol powder (GLYCOLAX/MIRALAX) powder Take 17 g by mouth daily.   prochlorperazine (COMPAZINE) 10 MG tablet Take 1 tablet (10 mg total) by mouth every 6 (six) hours as needed (Nausea or vomiting). (Patient not taking: Reported on 05/09/2019)   PROLENSA 0.07 % SOLN Place 1 drop into the left eye daily. Pt has not started yet   topotecan in sodium chloride 0.9 % 100 mL Inject into the vein once. Days 1-5 every 21 days   vitamin B-12 (CYANOCOBALAMIN) 100 MCG tablet Take 100 mcg by mouth daily.   No facility-administered encounter medications on file as of 06/13/2019.     ALLERGIES:  Allergies  Allergen Reactions   Codeine Nausea Only   Niaspan [Niacin Er]      PHYSICAL EXAM:  ECOG Performance status: 1  Vitals:    06/13/19 0816  BP: (!) 117/48  Pulse: (!) 58  Resp: 18  Temp: (!) 97.1 F (36.2 C)  SpO2: 96%   Filed Weights   06/13/19 0816  Weight: 144 lb 6.4 oz (65.5 kg)    Physical Exam Vitals signs reviewed.  Constitutional:      Appearance: Normal appearance.  Cardiovascular:     Rate and Rhythm: Normal rate and regular rhythm.     Heart sounds: Normal heart sounds.  Pulmonary:     Effort: Pulmonary effort is normal.     Breath sounds: Normal breath sounds.  Abdominal:     General: There is no distension.     Palpations: Abdomen is soft. There is no mass.  Musculoskeletal:        General: No swelling.  Skin:    General: Skin is warm.  Neurological:     General: No focal deficit present.     Mental Status: He is alert and oriented to person, place, and time.  Psychiatric:        Mood and Affect: Mood normal.        Behavior: Behavior normal.      LABORATORY DATA:  I have reviewed the labs as listed.  CBC    Component Value Date/Time   WBC 6.3 06/13/2019 0831   RBC 2.86 (L) 06/13/2019 0831   HGB 8.8 (L) 06/13/2019 0831   HCT 28.4 (L) 06/13/2019 0831   PLT 256 06/13/2019 0831   MCV 99.3 06/13/2019 0831   MCH 30.8 06/13/2019 0831   MCHC 31.0 06/13/2019 0831   RDW 22.0 (H) 06/13/2019 0831   LYMPHSABS 1.5 06/13/2019 0831   MONOABS 0.6 06/13/2019 0831   EOSABS 0.1 06/13/2019 0831   BASOSABS 0.0 06/13/2019 0831   CMP Latest Ref Rng & Units 06/13/2019 05/09/2019 04/11/2019  Glucose 70 - 99 mg/dL 88 104(H) 102(H)  BUN 8 - 23 mg/dL _0 Creatinine 0.61 - 1.24 mg/dL 0.93 0.79 0.84  Sodium 135 - 145 mmol/L 137 137 136  Potassium 3.5 - 5.1 mmol/L 3.7 3.8 4.1  Chloride 98 - 111 mmol/L 104 105 105  CO2 22 - 32 mmol/L 23 21(L) 29  Calcium 8.9 - 10.3 mg/dL 8.7(L) 8.9 8.5(L)  Total Protein 6.5 - 8.1 g/dL 6.8 6.7 6.1(L)  Total Bilirubin 0.3 - 1.2 mg/dL 0.5 0.7 0.2(L)  Alkaline  Phos 38 - 126 U/L 52 52 54  AST 15 - 41 U/L 17 16 13(L)  ALT 0 - 44 U/L _0 I have  reviewed his scans independently and discussed with the patient.   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 -We reviewed results of CT CAP from 06/07/2019 which showed stable bilateral pulmonary nodules with stable lymph nodes.  No evidence of active malignancy in the abdomen or pelvis. -he is continuing to tolerate current therapy very well.  Intermittent dizziness is stable. -I reviewed his labs.  He will proceed with his next cycle today.  I plan to reevaluate him in 4 weeks.  2.  Chronic pain: -He will continue Percocet 1 and half tablets twice daily as needed.  3.  Peripheral neuropathy: -He will continue gabapentin 600 mg 3 times a day.  4.  Normocytic anemia: -This is chemotherapy-induced.  He also received Feraheme in the past. -His ferritin was 164 on 05/09/2019.  We will continue to monitor it closely and give him iron infusions as needed.  Total time spent is 25 minutes with more than 50% of the time spent face-to-face discussing scan results, treatment plan and coordination of care.    Orders placed this encounter:  Orders Placed This Encounter  Procedures   CBC with Differential/Platelet   Comprehensive metabolic panel      Derek Jack, MD [place] [phone]

## 2019-06-13 NOTE — Assessment & Plan Note (Signed)
1.  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 -We reviewed results of CT CAP from 06/07/2019 which showed stable bilateral pulmonary nodules with stable lymph nodes.  No evidence of active malignancy in the abdomen or pelvis. -he is continuing to tolerate current therapy very well.  Intermittent dizziness is stable. -I reviewed his labs.  He will proceed with his next cycle today.  I plan to reevaluate him in 4 weeks.  2.  Chronic pain: -He will continue Percocet 1 and half tablets twice daily as needed.  3.  Peripheral neuropathy: -He will continue gabapentin 600 mg 3 times a day.  4.  Normocytic anemia: -This is chemotherapy-induced.  He also received Feraheme in the past. -His ferritin was 164 on 05/09/2019.  We will continue to monitor it closely and give him iron infusions as needed.

## 2019-06-13 NOTE — Progress Notes (Signed)
Labs reviewed with MD today . Proceed with treatment per MD.   Treatment given per orders. Patient tolerated it well without problems. Vitals stable and discharged home from clinic ambulatory. Follow up as scheduled.

## 2019-06-13 NOTE — Patient Instructions (Addendum)
Uncertain at [facility] Discharge Instructions  You were seen today by Dr. Delton Coombes. He went over your recent lab results. He will see you back in 3 weeks for labs, treatment and follow up.   Thank you for choosing [place] at [facility] to provide your oncology and hematology care.  To afford each patient quality time with our provider, please arrive at least 15 minutes before your scheduled appointment time.   If you have a lab appointment with the [facility] please come in thru the  Main Entrance and check in at the main information desk  You need to re-schedule your appointment should you arrive 10 or more minutes late.  We strive to give you quality time with our providers, and arriving late affects you and other patients whose appointments are after yours.  Also, if you no show three or more times for appointments you may be dismissed from the clinic at the providers discretion.     Again, thank you for choosing [facility].  Our hope is that these requests will decrease the amount of time that you wait before being seen by our physicians.       _____________________________________________________________  Should you have questions after your visit to [facility], please contact our office at [phone] between the hours of 8:00 a.m. and 4:30 p.m.  Voicemails left after 4:00 p.m. will not be returned until the following business day.  For prescription refill requests, have your pharmacy contact our office and allow 72 hours.    Cancer Center Support Programs:   > Cancer Support Group  2nd Tuesday of the month 1pm-2pm, Journey Room

## 2019-06-14 ENCOUNTER — Inpatient Hospital Stay (HOSPITAL_COMMUNITY): Payer: Medicare PPO | Attending: Hematology

## 2019-06-14 VITALS — BP 105/47 | HR 50 | Temp 97.7°F | Resp 16

## 2019-06-14 DIAGNOSIS — D6481 Anemia due to antineoplastic chemotherapy: Secondary | ICD-10-CM | POA: Diagnosis not present

## 2019-06-14 DIAGNOSIS — Z5111 Encounter for antineoplastic chemotherapy: Secondary | ICD-10-CM | POA: Diagnosis not present

## 2019-06-14 DIAGNOSIS — C3431 Malignant neoplasm of lower lobe, right bronchus or lung: Secondary | ICD-10-CM | POA: Diagnosis not present

## 2019-06-14 DIAGNOSIS — C787 Secondary malignant neoplasm of liver and intrahepatic bile duct: Secondary | ICD-10-CM | POA: Diagnosis not present

## 2019-06-14 DIAGNOSIS — C3490 Malignant neoplasm of unspecified part of unspecified bronchus or lung: Secondary | ICD-10-CM

## 2019-06-14 DIAGNOSIS — C3432 Malignant neoplasm of lower lobe, left bronchus or lung: Secondary | ICD-10-CM | POA: Diagnosis not present

## 2019-06-14 MED ORDER — TOPOTECAN HCL CHEMO INJECTION 4 MG
1.2000 mg/m2 | Freq: Once | INTRAVENOUS | Status: AC
  Administered 2019-06-14: 14:00:00 2.2 mg via INTRAVENOUS
  Filled 2019-06-14: qty 2.2

## 2019-06-14 MED ORDER — HEPARIN SOD (PORK) LOCK FLUSH 100 UNIT/ML IV SOLN
500.0000 [IU] | Freq: Once | INTRAVENOUS | Status: AC | PRN
  Administered 2019-06-14: 500 [IU]

## 2019-06-14 MED ORDER — SODIUM CHLORIDE 0.9 % IV SOLN
Freq: Once | INTRAVENOUS | Status: AC
  Administered 2019-06-14: 13:00:00 via INTRAVENOUS
  Filled 2019-06-14: qty 2

## 2019-06-14 MED ORDER — SODIUM CHLORIDE 0.9% FLUSH
10.0000 mL | INTRAVENOUS | Status: DC | PRN
  Administered 2019-06-14: 10 mL
  Filled 2019-06-14: qty 10

## 2019-06-14 MED ORDER — SODIUM CHLORIDE 0.9 % IV SOLN
Freq: Once | INTRAVENOUS | Status: AC
  Administered 2019-06-14: 13:00:00 via INTRAVENOUS

## 2019-06-14 NOTE — Patient Instructions (Signed)
[facility] Discharge Instructions for Patients Receiving Chemotherapy  Today you received the following chemotherapy agents   To help prevent nausea and vomiting after your treatment, we encourage you to take your nausea medication   If you develop nausea and vomiting that is not controlled by your nausea medication, call the clinic.   BELOW ARE SYMPTOMS THAT SHOULD BE REPORTED IMMEDIATELY:  *FEVER GREATER THAN 100.5 F  *CHILLS WITH OR WITHOUT FEVER  NAUSEA AND VOMITING THAT IS NOT CONTROLLED WITH YOUR NAUSEA MEDICATION  *UNUSUAL SHORTNESS OF BREATH  *UNUSUAL BRUISING OR BLEEDING  TENDERNESS IN MOUTH AND THROAT WITH OR WITHOUT PRESENCE OF ULCERS  *URINARY PROBLEMS  *BOWEL PROBLEMS  UNUSUAL RASH Items with * indicate a potential emergency and should be followed up as soon as possible.  Feel free to call the clinic should you have any questions or concerns. The clinic phone number is [phone].  Please show the CHEMO ALERT CARD at check-in to the Emergency Department and triage nurse.   

## 2019-06-14 NOTE — Progress Notes (Signed)
Treatment given per orders. Patient tolerated it well without problems. Vitals stable and discharged home from clinic ambulatory. Follow up as scheduled.  

## 2019-06-15 ENCOUNTER — Inpatient Hospital Stay (HOSPITAL_COMMUNITY): Payer: Medicare PPO

## 2019-06-15 ENCOUNTER — Encounter (HOSPITAL_COMMUNITY): Payer: Self-pay

## 2019-06-15 ENCOUNTER — Other Ambulatory Visit: Payer: Self-pay

## 2019-06-15 VITALS — BP 109/46 | HR 51 | Temp 96.9°F | Resp 18

## 2019-06-15 DIAGNOSIS — C787 Secondary malignant neoplasm of liver and intrahepatic bile duct: Secondary | ICD-10-CM | POA: Diagnosis not present

## 2019-06-15 DIAGNOSIS — D6481 Anemia due to antineoplastic chemotherapy: Secondary | ICD-10-CM | POA: Diagnosis not present

## 2019-06-15 DIAGNOSIS — C3432 Malignant neoplasm of lower lobe, left bronchus or lung: Secondary | ICD-10-CM | POA: Diagnosis not present

## 2019-06-15 DIAGNOSIS — Z5111 Encounter for antineoplastic chemotherapy: Secondary | ICD-10-CM | POA: Diagnosis not present

## 2019-06-15 DIAGNOSIS — C3490 Malignant neoplasm of unspecified part of unspecified bronchus or lung: Secondary | ICD-10-CM

## 2019-06-15 DIAGNOSIS — C3431 Malignant neoplasm of lower lobe, right bronchus or lung: Secondary | ICD-10-CM | POA: Diagnosis not present

## 2019-06-15 MED ORDER — SODIUM CHLORIDE 0.9 % IV SOLN
Freq: Once | INTRAVENOUS | Status: AC
  Administered 2019-06-15: 13:00:00 via INTRAVENOUS

## 2019-06-15 MED ORDER — TOPOTECAN HCL CHEMO INJECTION 4 MG
1.2000 mg/m2 | Freq: Once | INTRAVENOUS | Status: AC
  Administered 2019-06-15: 2.2 mg via INTRAVENOUS
  Filled 2019-06-15: qty 2.2

## 2019-06-15 MED ORDER — SODIUM CHLORIDE 0.9% FLUSH
10.0000 mL | INTRAVENOUS | Status: DC | PRN
  Administered 2019-06-15: 10 mL
  Filled 2019-06-15: qty 10

## 2019-06-15 MED ORDER — HEPARIN SOD (PORK) LOCK FLUSH 100 UNIT/ML IV SOLN
500.0000 [IU] | Freq: Once | INTRAVENOUS | Status: AC | PRN
  Administered 2019-06-15: 500 [IU]

## 2019-06-15 MED ORDER — SODIUM CHLORIDE 0.9 % IV SOLN
Freq: Once | INTRAVENOUS | Status: DC
  Filled 2019-06-15: qty 2

## 2019-06-15 MED ORDER — SODIUM CHLORIDE 0.9 % IV SOLN
Freq: Once | INTRAVENOUS | Status: AC
  Administered 2019-06-15: 13:00:00 via INTRAVENOUS
  Filled 2019-06-15: qty 2

## 2019-06-15 NOTE — Progress Notes (Signed)
[name] tolerated Topotecan infusion well without complaints or incident. Port left accessed and flushed for use tomorrow. VSS upon discharge. Pt discharged self ambulatory in satisfactory condition

## 2019-06-15 NOTE — Patient Instructions (Signed)
[facility] Discharge Instructions for Patients Receiving Chemotherapy   Beginning January 23rd 2017 lab work for the [facility] will be done in the  Main lab at [facility] on 1st floor. If you have a lab appointment with the [facility] please come in thru the  Main Entrance and check in at the main information desk   Today you received the following chemotherapy agents Topotecan. Follow-up as scheduled. Call clinic for any questions or concerns To help prevent nausea and vomiting after your treatment, we encourage you to take your nausea medication   If you develop nausea and vomiting, or diarrhea that is not controlled by your medication, call the clinic.  The clinic phone number is [phone]. Office hours are Monday-Friday 8:30am-5:00pm.  BELOW ARE SYMPTOMS THAT SHOULD BE REPORTED IMMEDIATELY:  *FEVER GREATER THAN 101.0 F  *CHILLS WITH OR WITHOUT FEVER  NAUSEA AND VOMITING THAT IS NOT CONTROLLED WITH YOUR NAUSEA MEDICATION  *UNUSUAL SHORTNESS OF BREATH  *UNUSUAL BRUISING OR BLEEDING  TENDERNESS IN MOUTH AND THROAT WITH OR WITHOUT PRESENCE OF ULCERS  *URINARY PROBLEMS  *BOWEL PROBLEMS  UNUSUAL RASH Items with * indicate a potential emergency and should be followed up as soon as possible. If you have an emergency after office hours please contact your primary care physician or go to the nearest emergency department.  Please call the clinic during office hours if you have any questions or concerns.   You may also contact the Patient Navigator at [phone] should you have any questions or need assistance in obtaining follow up care.      Resources For Cancer Patients and their Caregivers ? American Cancer Society: Can assist with transportation, wigs, general needs, runs Look Good Feel Better.        ([phone] ? Cancer Care: Provides financial assistance, online support groups, medication/co-pay assistance.  1-800-813-HOPE  620-407-5597) ? [place] Assists [place] cancer patients and their families through emotional , educational and financial support.  9095803636 ? [place] DSS Where to apply for food stamps, Medicaid and utility assistance. 605-282-9048 ? [employer]: Transportation to medical appointments. (475)555-0262 ? Social Security Administration: May apply for disability if have a Stage IV cancer. 314-825-5590 240-419-0574 ? [employer], Disability and Transit Services: Assists with nutrition, care and transit needs. 212-436-4980

## 2019-06-16 ENCOUNTER — Inpatient Hospital Stay (HOSPITAL_COMMUNITY): Payer: Medicare PPO

## 2019-06-16 VITALS — BP 140/48 | HR 50 | Temp 97.5°F | Resp 16

## 2019-06-16 DIAGNOSIS — D6481 Anemia due to antineoplastic chemotherapy: Secondary | ICD-10-CM | POA: Diagnosis not present

## 2019-06-16 DIAGNOSIS — C3432 Malignant neoplasm of lower lobe, left bronchus or lung: Secondary | ICD-10-CM | POA: Diagnosis not present

## 2019-06-16 DIAGNOSIS — Z5111 Encounter for antineoplastic chemotherapy: Secondary | ICD-10-CM | POA: Diagnosis not present

## 2019-06-16 DIAGNOSIS — C3490 Malignant neoplasm of unspecified part of unspecified bronchus or lung: Secondary | ICD-10-CM

## 2019-06-16 DIAGNOSIS — C3431 Malignant neoplasm of lower lobe, right bronchus or lung: Secondary | ICD-10-CM | POA: Diagnosis not present

## 2019-06-16 DIAGNOSIS — C787 Secondary malignant neoplasm of liver and intrahepatic bile duct: Secondary | ICD-10-CM | POA: Diagnosis not present

## 2019-06-16 MED ORDER — TOPOTECAN HCL CHEMO INJECTION 4 MG
1.2000 mg/m2 | Freq: Once | INTRAVENOUS | Status: AC
  Administered 2019-06-16: 2.2 mg via INTRAVENOUS
  Filled 2019-06-16: qty 2.2

## 2019-06-16 MED ORDER — SODIUM CHLORIDE 0.9% FLUSH
10.0000 mL | INTRAVENOUS | Status: DC | PRN
  Administered 2019-06-16: 10 mL
  Filled 2019-06-16: qty 10

## 2019-06-16 MED ORDER — HEPARIN SOD (PORK) LOCK FLUSH 100 UNIT/ML IV SOLN
500.0000 [IU] | Freq: Once | INTRAVENOUS | Status: AC | PRN
  Administered 2019-06-16: 500 [IU]

## 2019-06-16 MED ORDER — SODIUM CHLORIDE 0.9 % IV SOLN
Freq: Once | INTRAVENOUS | Status: AC
  Administered 2019-06-16: 11:00:00 via INTRAVENOUS
  Filled 2019-06-16: qty 2

## 2019-06-16 MED ORDER — SODIUM CHLORIDE 0.9 % IV SOLN
Freq: Once | INTRAVENOUS | Status: AC
  Administered 2019-06-16: 11:00:00 via INTRAVENOUS

## 2019-06-16 NOTE — Progress Notes (Signed)
No new issues today. Proceed with treatment today as planned.   Treatment given per orders. Patient tolerated it well without problems. Vitals stable and discharged home from clinic ambulatory. Follow up as scheduled.

## 2019-06-16 NOTE — Patient Instructions (Signed)
[facility] Discharge Instructions for Patients Receiving Chemotherapy  Today you received the following chemotherapy agents   To help prevent nausea and vomiting after your treatment, we encourage you to take your nausea medication   If you develop nausea and vomiting that is not controlled by your nausea medication, call the clinic.   BELOW ARE SYMPTOMS THAT SHOULD BE REPORTED IMMEDIATELY:  *FEVER GREATER THAN 100.5 F  *CHILLS WITH OR WITHOUT FEVER  NAUSEA AND VOMITING THAT IS NOT CONTROLLED WITH YOUR NAUSEA MEDICATION  *UNUSUAL SHORTNESS OF BREATH  *UNUSUAL BRUISING OR BLEEDING  TENDERNESS IN MOUTH AND THROAT WITH OR WITHOUT PRESENCE OF ULCERS  *URINARY PROBLEMS  *BOWEL PROBLEMS  UNUSUAL RASH Items with * indicate a potential emergency and should be followed up as soon as possible.  Feel free to call the clinic should you have any questions or concerns. The clinic phone number is [phone].  Please show the CHEMO ALERT CARD at check-in to the Emergency Department and triage nurse.   

## 2019-06-17 ENCOUNTER — Inpatient Hospital Stay (HOSPITAL_COMMUNITY): Payer: Medicare PPO

## 2019-06-17 VITALS — BP 138/46 | HR 68 | Temp 96.9°F | Resp 18

## 2019-06-17 DIAGNOSIS — C3490 Malignant neoplasm of unspecified part of unspecified bronchus or lung: Secondary | ICD-10-CM

## 2019-06-17 DIAGNOSIS — C3432 Malignant neoplasm of lower lobe, left bronchus or lung: Secondary | ICD-10-CM | POA: Diagnosis not present

## 2019-06-17 DIAGNOSIS — C3431 Malignant neoplasm of lower lobe, right bronchus or lung: Secondary | ICD-10-CM | POA: Diagnosis not present

## 2019-06-17 DIAGNOSIS — C787 Secondary malignant neoplasm of liver and intrahepatic bile duct: Secondary | ICD-10-CM | POA: Diagnosis not present

## 2019-06-17 DIAGNOSIS — D6481 Anemia due to antineoplastic chemotherapy: Secondary | ICD-10-CM | POA: Diagnosis not present

## 2019-06-17 DIAGNOSIS — Z5111 Encounter for antineoplastic chemotherapy: Secondary | ICD-10-CM | POA: Diagnosis not present

## 2019-06-17 MED ORDER — TOPOTECAN HCL CHEMO INJECTION 4 MG
1.2000 mg/m2 | Freq: Once | INTRAVENOUS | Status: AC
  Administered 2019-06-17: 2.2 mg via INTRAVENOUS
  Filled 2019-06-17: qty 2.2

## 2019-06-17 MED ORDER — SODIUM CHLORIDE 0.9% FLUSH
10.0000 mL | INTRAVENOUS | Status: DC | PRN
  Administered 2019-06-17: 10 mL
  Filled 2019-06-17: qty 10

## 2019-06-17 MED ORDER — SODIUM CHLORIDE 0.9 % IV SOLN
Freq: Once | INTRAVENOUS | Status: AC
  Administered 2019-06-17: 11:00:00 via INTRAVENOUS

## 2019-06-17 MED ORDER — PEGFILGRASTIM 6 MG/0.6ML ~~LOC~~ PSKT
6.0000 mg | PREFILLED_SYRINGE | Freq: Once | SUBCUTANEOUS | Status: AC
  Administered 2019-06-17: 6 mg via SUBCUTANEOUS

## 2019-06-17 MED ORDER — HEPARIN SOD (PORK) LOCK FLUSH 100 UNIT/ML IV SOLN
500.0000 [IU] | Freq: Once | INTRAVENOUS | Status: AC | PRN
  Administered 2019-06-17: 500 [IU]

## 2019-06-17 MED ORDER — SODIUM CHLORIDE 0.9 % IV SOLN
Freq: Once | INTRAVENOUS | Status: AC
  Administered 2019-06-17: 11:00:00 via INTRAVENOUS
  Filled 2019-06-17: qty 2

## 2019-06-17 MED ORDER — PEGFILGRASTIM 6 MG/0.6ML ~~LOC~~ PSKT
PREFILLED_SYRINGE | SUBCUTANEOUS | Status: AC
  Filled 2019-06-17: qty 0.6

## 2019-06-17 NOTE — Progress Notes (Signed)
Treatment given per orders. Patient tolerated it well without problems. Vitals stable and discharged home from clinic ambulatory. Follow up as scheduled.  

## 2019-06-20 ENCOUNTER — Telehealth (HOSPITAL_COMMUNITY): Payer: Self-pay | Admitting: *Deleted

## 2019-06-20 NOTE — Telephone Encounter (Signed)
Pt called into clinic asking if he could have chemo 4 days after having cataract surgery. [name] made aware and okayed it for the pt to have chemo after cataract surgery. Called pt to let him know and pt verbalized understanding.

## 2019-06-23 ENCOUNTER — Other Ambulatory Visit: Payer: Self-pay | Admitting: Family Medicine

## 2019-06-23 DIAGNOSIS — F411 Generalized anxiety disorder: Secondary | ICD-10-CM

## 2019-06-23 DIAGNOSIS — F5101 Primary insomnia: Secondary | ICD-10-CM

## 2019-06-23 MED ORDER — ALPRAZOLAM 1 MG PO TABS: 1.0000 mg | ORAL_TABLET | Freq: Four times a day (QID) | ORAL | 2 refills | Status: DC | PRN

## 2019-06-23 NOTE — Telephone Encounter (Signed)
Pt is requesting refill on Xanax   LOV: 12/28/2018  LRF:   05/26/19

## 2019-06-23 NOTE — Telephone Encounter (Signed)
Patient requesting a refill on his alprazolam sent to [facility]. He will be out tomorrow.   CB# [phone]

## 2019-07-06 ENCOUNTER — Other Ambulatory Visit: Payer: Self-pay | Admitting: Family Medicine

## 2019-07-14 DIAGNOSIS — H25812 Combined forms of age-related cataract, left eye: Secondary | ICD-10-CM | POA: Diagnosis not present

## 2019-07-14 DIAGNOSIS — H268 Other specified cataract: Secondary | ICD-10-CM | POA: Diagnosis not present

## 2019-07-18 ENCOUNTER — Other Ambulatory Visit: Payer: Self-pay

## 2019-07-18 ENCOUNTER — Inpatient Hospital Stay (HOSPITAL_COMMUNITY): Payer: Medicare HMO

## 2019-07-18 ENCOUNTER — Encounter (HOSPITAL_COMMUNITY): Payer: Self-pay | Admitting: Hematology

## 2019-07-18 ENCOUNTER — Inpatient Hospital Stay (HOSPITAL_COMMUNITY): Payer: Medicare HMO | Attending: Hematology

## 2019-07-18 ENCOUNTER — Inpatient Hospital Stay (HOSPITAL_BASED_OUTPATIENT_CLINIC_OR_DEPARTMENT_OTHER): Payer: Medicare HMO | Admitting: Hematology

## 2019-07-18 VITALS — BP 110/47 | HR 50 | Temp 97.1°F | Resp 16 | Wt 149.0 lb

## 2019-07-18 DIAGNOSIS — D6481 Anemia due to antineoplastic chemotherapy: Secondary | ICD-10-CM | POA: Insufficient documentation

## 2019-07-18 DIAGNOSIS — Z5111 Encounter for antineoplastic chemotherapy: Secondary | ICD-10-CM | POA: Diagnosis not present

## 2019-07-18 DIAGNOSIS — C3490 Malignant neoplasm of unspecified part of unspecified bronchus or lung: Secondary | ICD-10-CM

## 2019-07-18 DIAGNOSIS — C3432 Malignant neoplasm of lower lobe, left bronchus or lung: Secondary | ICD-10-CM | POA: Insufficient documentation

## 2019-07-18 DIAGNOSIS — C787 Secondary malignant neoplasm of liver and intrahepatic bile duct: Secondary | ICD-10-CM | POA: Insufficient documentation

## 2019-07-18 DIAGNOSIS — C770 Secondary and unspecified malignant neoplasm of lymph nodes of head, face and neck: Secondary | ICD-10-CM | POA: Diagnosis not present

## 2019-07-18 DIAGNOSIS — J449 Chronic obstructive pulmonary disease, unspecified: Secondary | ICD-10-CM | POA: Diagnosis not present

## 2019-07-18 DIAGNOSIS — C3431 Malignant neoplasm of lower lobe, right bronchus or lung: Secondary | ICD-10-CM | POA: Insufficient documentation

## 2019-07-18 LAB — CBC WITH DIFFERENTIAL/PLATELET
Abs Immature Granulocytes: 0.02 10*3/uL (ref 0.00–0.07)
Basophils Absolute: 0.1 10*3/uL (ref 0.0–0.1)
Basophils Relative: 1 %
Eosinophils Absolute: 0.1 10*3/uL (ref 0.0–0.5)
Eosinophils Relative: 2 %
HCT: 28.1 % — ABNORMAL LOW (ref 39.0–52.0)
Hemoglobin: 8.8 g/dL — ABNORMAL LOW (ref 13.0–17.0)
Immature Granulocytes: 0 %
Lymphocytes Relative: 26 %
Lymphs Abs: 1.4 10*3/uL (ref 0.7–4.0)
MCH: 30.9 pg (ref 26.0–34.0)
MCHC: 31.3 g/dL (ref 30.0–36.0)
MCV: 98.6 fL (ref 80.0–100.0)
Monocytes Absolute: 0.5 10*3/uL (ref 0.1–1.0)
Monocytes Relative: 10 %
Neutro Abs: 3.3 10*3/uL (ref 1.7–7.7)
Neutrophils Relative %: 61 %
Platelets: 257 10*3/uL (ref 150–400)
RBC: 2.85 MIL/uL — ABNORMAL LOW (ref 4.22–5.81)
RDW: 22.2 % — ABNORMAL HIGH (ref 11.5–15.5)
WBC: 5.4 10*3/uL (ref 4.0–10.5)
nRBC: 0 % (ref 0.0–0.2)

## 2019-07-18 LAB — COMPREHENSIVE METABOLIC PANEL
ALT: 14 U/L (ref 0–44)
AST: 15 U/L (ref 15–41)
Albumin: 3.8 g/dL (ref 3.5–5.0)
Alkaline Phosphatase: 50 U/L (ref 38–126)
Anion gap: 6 (ref 5–15)
BUN: 14 mg/dL (ref 8–23)
CO2: 24 mmol/L (ref 22–32)
Calcium: 8.6 mg/dL — ABNORMAL LOW (ref 8.9–10.3)
Chloride: 106 mmol/L (ref 98–111)
Creatinine, Ser: 0.77 mg/dL (ref 0.61–1.24)
GFR calc Af Amer: >60 mL/min (ref >60)
GFR calc non Af Amer: >60 mL/min (ref >60)
Glucose, Bld: 95 mg/dL (ref 70–99)
Potassium: 4 mmol/L (ref 3.5–5.1)
Sodium: 136 mmol/L (ref 135–145)
Total Bilirubin: 0.4 mg/dL (ref 0.3–1.2)
Total Protein: 6.4 g/dL — ABNORMAL LOW (ref 6.5–8.1)

## 2019-07-18 MED ORDER — HEPARIN SOD (PORK) LOCK FLUSH 100 UNIT/ML IV SOLN
500.0000 [IU] | Freq: Once | INTRAVENOUS | Status: AC | PRN
  Administered 2019-07-18: 13:00:00 500 [IU]

## 2019-07-18 MED ORDER — SODIUM CHLORIDE 0.9 % IV SOLN
Freq: Once | INTRAVENOUS | Status: AC
  Filled 2019-07-18: qty 2

## 2019-07-18 MED ORDER — SODIUM CHLORIDE 0.9 % IV SOLN: Freq: Once | INTRAVENOUS | Status: AC

## 2019-07-18 MED ORDER — SODIUM CHLORIDE 0.9% FLUSH
10.0000 mL | INTRAVENOUS | Status: DC | PRN
  Administered 2019-07-18: 10:00:00 10 mL

## 2019-07-18 MED ORDER — TOPOTECAN HCL CHEMO INJECTION 4 MG
1.2000 mg/m2 | Freq: Once | INTRAVENOUS | Status: AC
  Administered 2019-07-18: 12:00:00 2.2 mg via INTRAVENOUS
  Filled 2019-07-18: qty 2.2

## 2019-07-18 NOTE — Progress Notes (Signed)
Labs reviewed with MD today. Proceed with treatment today per MD.  Treatment given per orders. Patient tolerated it well without problems. Vitals stable and discharged home from clinic ambulatory. Follow up as scheduled.

## 2019-07-18 NOTE — Progress Notes (Signed)
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 Bulky 5.4 cm right supraclavicular region malignant lymph node conglomeration with extracapsular extension. 2. Surrounding smaller abnormal right level 3 and level 5 lymph nodes, and the lymphadenopathy continues into the superior mediastinum, see Chest CT findings reported separately. 3. No other metastatic disease identified in the neck.   01/16/2017 Imaging   CT chest: IMPRESSION: 1. Extensive lymphadenopathy in the thorax and lower right cervical region, as discussed above. Primary differential considerations include lymphoma/leukemia or small cell carcinoma of the lung. Further evaluation a PET-CT could be considered to assess for additional sites of disease below the diaphragm if clinically appropriate. Additionally, ultrasound-guided biopsy of supraclavicular lymphadenopathy could be considered to establish a tissue diagnosis. 2. Indeterminate lesion in the periphery of segment 8 of the liver measuring 2.7 x 1.7 cm. Attention at time of follow-up PET-CT is recommended. 3. Aortic atherosclerosis, in addition to left main and 3 vessel coronary artery disease. Please note that although the presence of coronary artery calcium documents the presence of coronary artery disease, the severity of this disease and any potential stenosis cannot be assessed on this non-gated CT examination. Assessment for potential risk factor modification, dietary therapy or pharmacologic therapy may be warranted, if clinically indicated. 4. There are calcifications of the aortic valve.  Echocardiographic correlation for evaluation of potential valvular dysfunction may be warranted if clinically indicated. 5. Diffuse bronchial wall thickening with moderate centrilobular and paraseptal emphysema; imaging findings suggestive of underlying COPD.   02/03/2017 Initial Biopsy   (R) neck lymph node biopsy: SMALL CELL CARCINOMA (most likely lung primary).    02/03/2017 Miscellaneous   Port-a-cath attempted by IR; unable to place d/t enlarged SVC.    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 Staging for metastatic disease  EXAM: MRI HEAD WITHOUT AND WITH CONTRAST  TECHNIQUE: Multiplanar, multiecho pulse sequences of the brain and surrounding structures were obtained without and with intravenous contrast.  CONTRAST:  57m MULTIHANCE GADOBENATE DIMEGLUMINE 529 MG/ML IV SOLN  COMPARISON:  None.  FINDINGS: Brain: Negative for hydrocephalus. Cerebral volume normal for age. Small nonenhancing white matter hyperintensities consistent with mild chronic microvascular ischemia. No acute infarct. Negative for hemorrhage or mass or edema  Normal enhancement postcontrast infusion. No enhancing mass lesion. Leptomeningeal enhancement is normal.  Vascular: Normal arterial flow voids.  Normal venous enhancement  Skull and upper cervical spine: Negative  Sinuses/Orbits: Negative  Other: None  IMPRESSION: Negative for metastatic disease.  No acute abnormality.  Mild chronic white matter changes.   04/07/2017 Imaging    PET:  1. Marked reduction in size and metabolic activity of bulky RIGHT supraclavicular adenopathy mediastinal lymphadenopathy. 2. Residual moderate activity remains within small RIGHT supraclavicular lymph node, RIGHT lower paratracheal  lymph node and RIGHT hilar lymph node. 3. Resolution of prevascular and internal mammary mediastinal metastatic hypermetabolic activity. 4. Resolution of metabolic activity associated with solitary RIGHT  hepatic lobe liver metastasis. 5. No evidence of disease progression. 6. 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   05/27/2017 Imaging   MRI brain w/ and w/o contrast IMPRESSION: 1. No metastatic disease identified. 2. Increased nonspecific cerebral white matter signal changes since August. These are most commonly small vessel disease related. 3. New right maxillary sinusitis. Benign appearing retention cysts in the nasopharynx with trace mastoid effusions.   06/12/2017 Imaging   PET-CT IMPRESSION: 1. There are two new hypermetabolic nodules identified within both lower lobes measuring up to 3.1 cm. The appearance is nonspecific and may be inflammatory/infectious in etiology. Pulmonary metastatic disease cannot be excluded and short-term follow-up imaging in 3 months is advised to reassess these nodules. 2. Stable appearance of mild hypermetabolic activity associated with right paratracheal and right hilar lymph nodes. 3. Decrease in FDG uptake associated with index right supraclavicular lymph node. 4. 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 5. Aortic Atherosclerosis (ICD10-I70.0) and Emphysema (ICD10-J43.9).   08/24/2017 -  Chemotherapy   The patient had pegfilgrastim (NEULASTA ONPRO [place]) injection 6 mg, 6 mg, Subcutaneous, Once, 25 of 27 cycles Administration: 6 mg (08/28/2017), 6 mg (09/18/2017), 6 mg (10/09/2017), 6 mg (11/02/2017), 6 mg (11/20/2017), 6 mg (12/18/2017), 6 mg (01/15/2018), 6 mg (02/12/2018), 6 mg (03/12/2018), 6 mg (04/09/2018), 6 mg (05/07/2018), 6 mg (06/04/2018), 6 mg  (07/02/2018), 6 mg (07/30/2018), 6 mg (08/27/2018), 6 mg (09/24/2018), 6 mg (10/29/2018), 6 mg (11/26/2018), 6 mg (12/24/2018), 6 mg (01/21/2019), 6 mg (02/18/2019), 6 mg (03/18/2019), 6 mg (04/15/2019), 6 mg (05/13/2019), 6 mg (06/17/2019) topotecan (HYCAMTIN) 2.9 mg in sodium chloride 0.9 % 100 mL chemo infusion, 1.5 mg/m2 = 2.9 mg, Intravenous,  Once, 25 of 27 cycles Dose modification: 1.2 mg/m2 (80 % of original dose 1.5 mg/m2, Cycle 4, Reason: Dose Not Tolerated) Administration: 2.9 mg (08/24/2017), 2.9 mg (08/25/2017), 2.9 mg (08/28/2017), 2.9 mg (08/26/2017), 2.9 mg (08/27/2017), 2.9 mg (09/14/2017), 2.9 mg (09/15/2017), 2.9 mg (09/16/2017), 2.9 mg (09/17/2017), 2.9 mg (09/18/2017), 2.9 mg (10/05/2017), 2.9 mg (10/06/2017), 2.9 mg (10/07/2017), 2.9 mg (10/08/2017), 2.9 mg (10/09/2017), 2.3 mg (10/26/2017), 2.3 mg (10/27/2017), 2.3 mg (10/28/2017), 2.3 mg (10/29/2017), 2.3 mg (11/02/2017), 2.3 mg (11/16/2017), 2.3 mg (11/17/2017), 2.3 mg (11/18/2017), 2.3 mg (11/19/2017), 2.3 mg (11/20/2017), 2.3 mg (12/14/2017), 2.3 mg (12/15/2017), 2.3 mg (12/16/2017), 2.3 mg (12/17/2017), 2.3 mg (12/18/2017), 2.3 mg (01/11/2018), 2.3 mg (01/12/2018), 2.3 mg (01/13/2018), 2.3 mg (01/15/2018), 2.3 mg (02/08/2018), 2.3 mg (02/09/2018), 2.3 mg (02/10/2018), 2.3 mg (02/11/2018), 2.3 mg (02/12/2018), 2.3 mg (03/08/2018), 2.3 mg (03/09/2018), 2.3 mg (03/10/2018), 2.3 mg (03/11/2018), 2.3 mg (03/12/2018), 2.2 mg (04/05/2018), 2.2 mg (04/06/2018), 2.2 mg (04/07/2018), 2.2 mg (04/08/2018), 2.2 mg (04/09/2018), 2.2 mg (05/03/2018), 2.2 mg (05/04/2018), 2.2 mg (05/05/2018), 2.2 mg (05/06/2018), 2.2 mg (05/07/2018), 2.2 mg (05/31/2018), 2.2 mg (06/01/2018), 2.2 mg (06/02/2018), 2.2 mg (06/03/2018), 2.2 mg (06/04/2018), 2.2 mg (06/28/2018), 2.2 mg (06/29/2018), 2.2 mg (06/30/2018), 2.2 mg (07/01/2018), 2.2 mg (07/02/2018), 2.2 mg (07/26/2018), 2.2 mg (07/27/2018), 2.2 mg (07/28/2018), 2.2 mg (07/29/2018), 2.2 mg (07/30/2018), 2.2 mg (08/23/2018), 2.2 mg (08/24/2018), 2.2 mg (08/25/2018), 2.2 mg (08/26/2018), 2.2 mg  (08/27/2018), 2.2 mg (09/20/2018), 2.2 mg (09/21/2018), 2.2 mg (09/22/2018), 2.2 mg (09/23/2018), 2.2 mg (09/24/2018), 2.2 mg (10/25/2018), 2.2 mg (10/26/2018), 2.2 mg (10/27/2018), 2.2 mg (10/28/2018), 2.2 mg (10/29/2018), 2.2 mg (11/22/2018), 2.2 mg (11/23/2018), 2.2 mg (11/24/2018),  2.2 mg (11/25/2018), 2.2 mg (11/26/2018), 2.2 mg (12/20/2018), 2.2 mg (12/21/2018), 2.2 mg (12/22/2018), 2.2 mg (12/23/2018), 2.2 mg (12/24/2018), 2.2 mg (01/17/2019), 2.2 mg (01/18/2019), 2.2 mg (01/19/2019), 2.2 mg (01/20/2019), 2.2 mg (01/21/2019), 2.2 mg (02/14/2019), 2.2 mg (02/15/2019), 2.2 mg (02/16/2019), 2.2 mg (02/17/2019), 2.2 mg (02/18/2019), 2.2 mg (03/14/2019), 2.2 mg (03/15/2019), 2.2 mg (03/16/2019), 2.2 mg (03/17/2019), 2.2 mg (03/18/2019), 2.2 mg (04/11/2019), 2.2 mg (04/12/2019), 2.2 mg (04/13/2019), 2.2 mg (04/14/2019), 2.2 mg (04/15/2019), 2.2 mg (05/09/2019), 2.2 mg (05/10/2019), 2.2 mg (05/11/2019), 2.2 mg (05/12/2019), 2.2 mg (05/13/2019), 2.2 mg (06/13/2019), 2.2 mg (06/14/2019), 2.2 mg (06/15/2019), 2.2 mg (06/16/2019), 2.2 mg (06/17/2019) ondansetron (ZOFRAN) 4 mg in sodium chloride 0.9 % 50 mL IVPB, , Intravenous,  Once, 20 of 22 cycles Administration:  (02/08/2018),  (02/09/2018),  (02/10/2018),  (02/11/2018),  (02/12/2018),  (03/08/2018),  (03/09/2018),  (03/10/2018),  (03/11/2018),  (03/12/2018),  (04/05/2018),  (04/06/2018),  (04/07/2018),  (04/08/2018),  (04/09/2018),  (05/03/2018),  (05/04/2018),  (05/05/2018),  (05/06/2018),  (05/07/2018),  (05/31/2018),  (06/01/2018),  (06/02/2018),  (06/03/2018),  (06/04/2018),  (06/28/2018),  (06/29/2018),  (06/30/2018),  (07/01/2018),  (07/02/2018),  (07/26/2018),  (07/27/2018),  (07/28/2018),  (07/29/2018),  (07/30/2018),  (08/23/2018),  (08/24/2018),  (08/25/2018),  (08/26/2018),  (08/27/2018),  (09/20/2018),  (09/21/2018),  (09/22/2018),  (09/23/2018),  (09/24/2018),  (10/25/2018),  (10/26/2018),  (10/27/2018),  (10/28/2018),  (10/29/2018),  (11/22/2018),  (11/23/2018),  (11/24/2018),  (11/25/2018),  (11/26/2018),  (12/20/2018),  (12/21/2018),  (12/22/2018),   (12/23/2018),  (12/24/2018),  (01/17/2019),  (01/18/2019),  (01/19/2019),  (01/20/2019),  (01/21/2019),  (02/14/2019),  (02/15/2019),  (02/16/2019),  (02/17/2019),  (02/18/2019),  (03/14/2019),  (03/15/2019),  (03/16/2019),  (03/17/2019),  (03/18/2019),  (04/11/2019),  (04/12/2019),  (04/13/2019),  (04/14/2019),  (04/15/2019),  (05/09/2019),  (05/10/2019),  (05/11/2019),  (05/12/2019),  (05/13/2019),  (06/13/2019),  (06/14/2019),  (06/15/2019),  (06/16/2019),  (06/17/2019)  for chemotherapy treatment.         INTERVAL HISTORY:  [name] 69 y.o. male seen for follow-up of small cell lung cancer and toxicity assessment prior to next cycle of chemotherapy.  He had cataract surgery on 07/14/2019 which was uneventful.  Numbness in the feet has been stable and controlled with gabapentin.  Sleep problems are also stable.  Reports constipation with pain medication after taking Percocet twice daily.  Denies any fevers or chills.  REVIEW OF SYSTEMS:  Review of Systems  Gastrointestinal: Positive for constipation.  Neurological: Positive for numbness.  All other systems reviewed and are negative.    PAST MEDICAL/SURGICAL HISTORY:  Past Medical History:  Diagnosis Date  . Anxiety   . CAD (coronary artery disease)   . Cancer ([place])    stage 4 small cell lung cancer   . COPD (chronic obstructive pulmonary disease) ([place])   . Depression   . Dyspnea    increased exertion  . Feeling of chest tightness   . Heart palpitations   . History of chemotherapy   . Myocardial infarction ([place])   . Osteopenia   . Panic attacks   . Smoker    Past Surgical History:  Procedure Laterality Date  . BACK SURGERY  12/24/2000   L5,S1  . CORONARY STENT PLACEMENT  2005   RCA & CX  . HERNIA REPAIR Right 1980's  . INGUINAL HERNIA REPAIR  12/1978   right side  . IR FLUORO GUIDE PORT INSERTION RIGHT  04/02/2017  . IR US GUIDE BX ASP/DRAIN  02/03/2017  . IR US GUIDE VASC ACCESS RIGHT  04/02/2017  . NM MYOCAR PERF WALL MOTION  09/07/2009   No ischemia; EF  51%  . SHOULDER SURGERY Left 08/2010  . SPINE SURGERY  2002   L5-S1     SOCIAL HISTORY:  Social History   Socioeconomic History  . Marital status: Single    Spouse name: Not on file  . Number of children: Not on file  . Years of education: Not on file  . Highest education level: Not on file  Occupational History  . Not on file  Tobacco Use  . Smoking status: Current Every Day Smoker    Packs/day: 1.00    Types: Cigarettes  . Smokeless tobacco: Never Used  Substance and Sexual Activity  . Alcohol use: Yes    Comment: occas  . Drug use: No  . Sexual activity: Not on file  Other Topics Concern  . Not on file  Social History Narrative  . Not on file   Social Determinants of Health   Financial Resource Strain:   . Difficulty of Paying Living Expenses: Not on file  Food Insecurity:   . Worried About Charity fundraiser in the Last Year: Not on file  . Ran Out of Food in the Last Year: Not on file  Transportation Needs:   . Lack of Transportation (Medical): Not on file  . Lack of Transportation (Non-Medical): Not on file  Physical Activity:   . Days of Exercise per Week: Not on file  . Minutes of Exercise per Session: Not on file  Stress:   . Feeling of Stress : Not on file  Social Connections:   . Frequency of Communication with Friends and Family: Not on file  . Frequency of Social Gatherings with Friends and Family: Not on file  . Attends Religious Services: Not on file  . Active Member of Clubs or Organizations: Not on file  . Attends Archivist Meetings: Not on file  . Marital Status: Not on file  Intimate Partner Violence:   . Fear of Current or Ex-Partner: Not on file  . Emotionally Abused: Not on file  . Physically Abused: Not on file  . Sexually Abused: Not on file    FAMILY HISTORY:  Family History  Problem Relation Age of Onset  . Heart attack Father   . Kidney disease Father        renal failure  . Heart failure Mother   . Heart  attack Mother   . Cancer Brother   . Diabetes Brother   . Alcohol abuse Brother   . Diabetes Sister     CURRENT MEDICATIONS:  Outpatient Encounter Medications as of 07/18/2019  Medication Sig  . aspirin EC 81 MG tablet Take 81 mg by mouth daily.  Marland Kitchen atorvastatin (LIPITOR) 40 MG tablet TAKE (1) TABLET BY MOUTH ONCE DAILY.  . cholecalciferol (VITAMIN D3) 25 MCG (1000 UT) tablet Take 1,000 Units by mouth daily.  . clotrimazole-betamethasone (LOTRISONE) cream Apply 1 application topically 2 (two) times daily.  Marland Kitchen gabapentin (NEURONTIN) 300 MG capsule Take 2 capsules (600 mg total) by mouth 3 (three) times daily.  . Pegfilgrastim (NEULASTA ONPRO [place]) Inject into the skin. Every 21 days  . polyethylene glycol powder (GLYCOLAX/MIRALAX) powder Take 17 g by mouth daily.  Marland Kitchen PROLENSA 0.07 % SOLN Place 1 drop into the left eye daily. Pt has not started yet  . topotecan in sodium chloride 0.9 % 100 mL Inject into the vein once. Days 1-5 every 21 days  . vitamin B-12 (CYANOCOBALAMIN) 100 MCG tablet Take 100 mcg  by mouth daily.  Marland Kitchen albuterol (VENTOLIN HFA) 108 (90 Base) MCG/ACT inhaler Inhale 2 puffs into the lungs every 6 (six) hours as needed for wheezing or shortness of breath.  . ALPRAZolam (XANAX) 1 MG tablet Take 1 tablet (1 mg total) by mouth 4 (four) times daily as needed for anxiety. (Patient not taking: Reported on 07/18/2019)  . BESIVANCE 0.6 % SUSP Pt has not started yet  . hydrOXYzine (ATARAX/VISTARIL) 50 MG tablet TAKE ONE TABLET BY MOUTH AT BEDTIME AS NEEDED FOR SLEEP. (Patient not taking: Reported on 07/18/2019)  . LOTEMAX 0.5 % ophthalmic suspension Place 1 drop into the left eye 4 (four) times daily. Pt has not started yet  . ondansetron (ZOFRAN) 8 MG tablet Take 1 tablet (8 mg total) by mouth 2 (two) times daily as needed. Start on the third day after cisplatin chemotherapy. (Patient not taking: Reported on 05/09/2019)  . oxyCODONE-acetaminophen (PERCOCET/ROXICET) 5-325 MG tablet TAKE 1 TABLET  EVERY 8 HOURS AS NEEDED FOR SEVERE PAIN. (Patient not taking: Reported on 07/18/2019)  . prochlorperazine (COMPAZINE) 10 MG tablet Take 1 tablet (10 mg total) by mouth every 6 (six) hours as needed (Nausea or vomiting). (Patient not taking: Reported on 05/09/2019)   No facility-administered encounter medications on file as of 07/18/2019.    ALLERGIES:  Allergies  Allergen Reactions  . Codeine Nausea Only  . Niaspan [Niacin Er]      PHYSICAL EXAM:  ECOG Performance status: 1  There were no vitals filed for this visit. There were no vitals filed for this visit.  Physical Exam Vitals reviewed.  Constitutional:      Appearance: Normal appearance.  Cardiovascular:     Rate and Rhythm: Normal rate and regular rhythm.     Heart sounds: Normal heart sounds.  Pulmonary:     Effort: Pulmonary effort is normal.     Breath sounds: Normal breath sounds.  Abdominal:     General: There is no distension.     Palpations: Abdomen is soft. There is no mass.  Musculoskeletal:        General: No swelling.  Skin:    General: Skin is warm.  Neurological:     General: No focal deficit present.     Mental Status: He is alert and oriented to person, place, and time.  Psychiatric:        Mood and Affect: Mood normal.        Behavior: Behavior normal.      LABORATORY DATA:  I have reviewed the labs as listed.  CBC    Component Value Date/Time   WBC 5.4 07/18/2019 1005   RBC 2.85 (L) 07/18/2019 1005   HGB 8.8 (L) 07/18/2019 1005   HCT 28.1 (L) 07/18/2019 1005   PLT 257 07/18/2019 1005   MCV 98.6 07/18/2019 1005   MCH 30.9 07/18/2019 1005   MCHC 31.3 07/18/2019 1005   RDW 22.2 (H) 07/18/2019 1005   LYMPHSABS PENDING 07/18/2019 1005   MONOABS PENDING 07/18/2019 1005   EOSABS PENDING 07/18/2019 1005   BASOSABS PENDING 07/18/2019 1005   CMP Latest Ref Rng & Units 07/18/2019 06/13/2019 05/09/2019  Glucose 70 - 99 mg/dL 95 88 104(H)  BUN 8 - 23 mg/dL '14 18 17  '$ Creatinine 0.61 - 1.24 mg/dL  0.77 0.93 0.79  Sodium 135 - 145 mmol/L 136 137 137  Potassium 3.5 - 5.1 mmol/L 4.0 3.7 3.8  Chloride 98 - 111 mmol/L 106 104 105  CO2 22 - 32 mmol/L 24 23 21(L)  Calcium 8.9 - 10.3 mg/dL 8.6(L) 8.7(L) 8.9  Total Protein 6.5 - 8.1 g/dL 6.4(L) 6.8 6.7  Total Bilirubin 0.3 - 1.2 mg/dL 0.4 0.5 0.7  Alkaline Phos 38 - 126 U/L 50 52 52  AST 15 - 41 U/L '15 17 16  '$ ALT 0 - 44 U/L '14 13 14    '$ I have reviewed his scans independently and discussed with the patient.   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 -CT CAP on 06/07/2019 showed stable bilateral pulmonary nodules with stable lymph nodes.  No evidence of active malignancy in the abdomen or pelvis. -He had cataract surgery done on 07/14/2019 which was uneventful. -We reviewed his labs today.  They are adequate to proceed with cycle 26 today. -He will come back in 4 weeks for follow-up.  I plan to repeat CT scan in February.  2.  Chronic pain/constipation: -I will cut back Percocet to 1 tablet daily. -He is having constipation if he takes twice a day.  He is taking plant laxative 3 tablets at bedtime which is helping. -If it gets worse, will prescribe him lactulose.  3.  Peripheral neuropathy: -He will continue gabapentin 600 mg 3 times a day.  4.  Normocytic anemia: -This is chemotherapy-induced.  Today hemoglobin is 8.8.  No transfusion necessary. -Labs on 05/09/2019 shows normal P10, folic acid and ferritin.  Total time spent is 30 minutes with more than 50% of the time spent face-to-face reviewing scan results, discussing treatment plan, management of side effects and coordination of care.    Orders placed this encounter:  No orders of the defined types were placed in this  encounter.     Derek Jack, MD [place] [phone]

## 2019-07-18 NOTE — Patient Instructions (Addendum)
[place] at [facility] Discharge Instructions  You were seen today by Dr. Delton Coombes. He went over your recent lab results. He will see you back in 4 wees for labs, treatment and follow up.   Thank you for choosing [place] at [facility] to provide your oncology and hematology care.  To afford each patient quality time with our provider, please arrive at least 15 minutes before your scheduled appointment time.   If you have a lab appointment with the [facility] please come in thru the  Main Entrance and check in at the main information desk  You need to re-schedule your appointment should you arrive 10 or more minutes late.  We strive to give you quality time with our providers, and arriving late affects you and other patients whose appointments are after yours.  Also, if you no show three or more times for appointments you may be dismissed from the clinic at the providers discretion.     Again, thank you for choosing [facility].  Our hope is that these requests will decrease the amount of time that you wait before being seen by our physicians.       _____________________________________________________________  Should you have questions after your visit to [facility], please contact our office at [phone] between the hours of 8:00 a.m. and 4:30 p.m.  Voicemails left after 4:00 p.m. will not be returned until the following business day.  For prescription refill requests, have your pharmacy contact our office and allow 72 hours.    Cancer Center Support Programs:   > Cancer Support Group  2nd Tuesday of the month 1pm-2pm, Journey Room

## 2019-07-18 NOTE — Assessment & Plan Note (Addendum)
1.  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 -CT CAP on 06/07/2019 showed stable bilateral pulmonary nodules with stable lymph nodes.  No evidence of active malignancy in the abdomen or pelvis. -He had cataract surgery done on 07/14/2019 which was uneventful. -We reviewed his labs today.  They are adequate to proceed with cycle 26 today. -He will come back in 4 weeks for follow-up.  I plan to repeat CT scan in February.  2.  Chronic pain/constipation: -I will cut back Percocet to 1 tablet daily. -He is having constipation if he takes twice a day.  He is taking plant laxative 3 tablets at bedtime which is helping. -If it gets worse, will prescribe him lactulose.  3.  Peripheral neuropathy: -He will continue gabapentin 600 mg 3 times a day.  4.  Normocytic anemia: -This is chemotherapy-induced.  Today hemoglobin is 8.8.  No transfusion necessary. -Labs on 05/09/2019 shows normal R84, folic acid and ferritin.

## 2019-07-19 ENCOUNTER — Encounter (HOSPITAL_COMMUNITY): Payer: Self-pay | Admitting: General Practice

## 2019-07-19 ENCOUNTER — Encounter (HOSPITAL_COMMUNITY): Payer: Self-pay

## 2019-07-19 ENCOUNTER — Inpatient Hospital Stay (HOSPITAL_COMMUNITY): Payer: Medicare HMO

## 2019-07-19 VITALS — BP 109/41 | HR 49 | Temp 97.2°F | Resp 18

## 2019-07-19 DIAGNOSIS — Z5111 Encounter for antineoplastic chemotherapy: Secondary | ICD-10-CM | POA: Diagnosis not present

## 2019-07-19 DIAGNOSIS — C3490 Malignant neoplasm of unspecified part of unspecified bronchus or lung: Secondary | ICD-10-CM

## 2019-07-19 DIAGNOSIS — C770 Secondary and unspecified malignant neoplasm of lymph nodes of head, face and neck: Secondary | ICD-10-CM | POA: Diagnosis not present

## 2019-07-19 DIAGNOSIS — C3432 Malignant neoplasm of lower lobe, left bronchus or lung: Secondary | ICD-10-CM | POA: Diagnosis not present

## 2019-07-19 DIAGNOSIS — C3431 Malignant neoplasm of lower lobe, right bronchus or lung: Secondary | ICD-10-CM | POA: Diagnosis not present

## 2019-07-19 DIAGNOSIS — C787 Secondary malignant neoplasm of liver and intrahepatic bile duct: Secondary | ICD-10-CM | POA: Diagnosis not present

## 2019-07-19 DIAGNOSIS — D6481 Anemia due to antineoplastic chemotherapy: Secondary | ICD-10-CM | POA: Diagnosis not present

## 2019-07-19 MED ORDER — SODIUM CHLORIDE 0.9 % IV SOLN: Freq: Once | INTRAVENOUS | Status: AC

## 2019-07-19 MED ORDER — SODIUM CHLORIDE 0.9% FLUSH
10.0000 mL | INTRAVENOUS | Status: DC | PRN
  Administered 2019-07-19: 10 mL

## 2019-07-19 MED ORDER — TOPOTECAN HCL CHEMO INJECTION 4 MG
1.2000 mg/m2 | Freq: Once | INTRAVENOUS | Status: AC
  Administered 2019-07-19: 2.2 mg via INTRAVENOUS
  Filled 2019-07-19: qty 2.2

## 2019-07-19 MED ORDER — SODIUM CHLORIDE 0.9 % IV SOLN
Freq: Once | INTRAVENOUS | Status: AC
  Filled 2019-07-19: qty 2

## 2019-07-19 MED ORDER — HEPARIN SOD (PORK) LOCK FLUSH 100 UNIT/ML IV SOLN
500.0000 [IU] | Freq: Once | INTRAVENOUS | Status: AC | PRN
  Administered 2019-07-19: 500 [IU]

## 2019-07-19 NOTE — Patient Instructions (Signed)
[facility] Discharge Instructions for Patients Receiving Chemotherapy   Beginning January 23rd 2017 lab work for the [facility] will be done in the  Main lab at [facility] on 1st floor. If you have a lab appointment with the [facility] please come in thru the  Main Entrance and check in at the main information desk   Today you received the following chemotherapy agents Topotecan. Follow-up as scheduled. Call clinic for any questions or concerns  To help prevent nausea and vomiting after your treatment, we encourage you to take your nausea medication   If you develop nausea and vomiting, or diarrhea that is not controlled by your medication, call the clinic.  The clinic phone number is [phone]. Office hours are Monday-Friday 8:30am-5:00pm.  BELOW ARE SYMPTOMS THAT SHOULD BE REPORTED IMMEDIATELY:  *FEVER GREATER THAN 101.0 F  *CHILLS WITH OR WITHOUT FEVER  NAUSEA AND VOMITING THAT IS NOT CONTROLLED WITH YOUR NAUSEA MEDICATION  *UNUSUAL SHORTNESS OF BREATH  *UNUSUAL BRUISING OR BLEEDING  TENDERNESS IN MOUTH AND THROAT WITH OR WITHOUT PRESENCE OF ULCERS  *URINARY PROBLEMS  *BOWEL PROBLEMS  UNUSUAL RASH Items with * indicate a potential emergency and should be followed up as soon as possible. If you have an emergency after office hours please contact your primary care physician or go to the nearest emergency department.  Please call the clinic during office hours if you have any questions or concerns.   You may also contact the Patient Navigator at [phone] should you have any questions or need assistance in obtaining follow up care.      Resources For Cancer Patients and their Caregivers ? American Cancer Society: Can assist with transportation, wigs, general needs, runs Look Good Feel Better.        6283308287 ? Cancer Care: Provides financial assistance, online support groups, medication/co-pay assistance.  1-800-813-HOPE  631-341-4061) ? [place] Assists [place] cancer patients and their families through emotional , educational and financial support.  414-250-6305 ? [place] DSS Where to apply for food stamps, Medicaid and utility assistance. (623) 240-3564 ? [employer]: Transportation to medical appointments. 224-356-9376 ? Social Security Administration: May apply for disability if have a Stage IV cancer. 680-732-1104 4148861150 ? [employer], Disability and Transit Services: Assists with nutrition, care and transit needs. 803-712-8924

## 2019-07-19 NOTE — Progress Notes (Signed)
[facility] CSW Progress Notes  Enrolled in [employer] Program.  Will receive 7 meals/week delivered by [employer] for next 6 months.  Edwyna Shell, LCSW Clinical Social Worker Phone:  [phone] Cell:  [phone]

## 2019-07-19 NOTE — Progress Notes (Signed)
Harvie Junior tolerated Topotecan infusion well without complaints or incident. Port left accessed,saline locked and flushed per protocol for use tomorrow. VSS upon discharge.Pt discharged self ambulatory in satisfactory condition

## 2019-07-20 ENCOUNTER — Other Ambulatory Visit: Payer: Self-pay

## 2019-07-20 ENCOUNTER — Inpatient Hospital Stay (HOSPITAL_COMMUNITY): Payer: Medicare HMO

## 2019-07-20 ENCOUNTER — Encounter (HOSPITAL_COMMUNITY): Payer: Self-pay

## 2019-07-20 VITALS — BP 111/45 | HR 55 | Temp 97.8°F | Resp 18

## 2019-07-20 DIAGNOSIS — D6481 Anemia due to antineoplastic chemotherapy: Secondary | ICD-10-CM | POA: Diagnosis not present

## 2019-07-20 DIAGNOSIS — C770 Secondary and unspecified malignant neoplasm of lymph nodes of head, face and neck: Secondary | ICD-10-CM | POA: Diagnosis not present

## 2019-07-20 DIAGNOSIS — Z5111 Encounter for antineoplastic chemotherapy: Secondary | ICD-10-CM | POA: Diagnosis not present

## 2019-07-20 DIAGNOSIS — C3490 Malignant neoplasm of unspecified part of unspecified bronchus or lung: Secondary | ICD-10-CM

## 2019-07-20 DIAGNOSIS — C3431 Malignant neoplasm of lower lobe, right bronchus or lung: Secondary | ICD-10-CM | POA: Diagnosis not present

## 2019-07-20 DIAGNOSIS — C3432 Malignant neoplasm of lower lobe, left bronchus or lung: Secondary | ICD-10-CM | POA: Diagnosis not present

## 2019-07-20 DIAGNOSIS — C787 Secondary malignant neoplasm of liver and intrahepatic bile duct: Secondary | ICD-10-CM | POA: Diagnosis not present

## 2019-07-20 MED ORDER — HEPARIN SOD (PORK) LOCK FLUSH 100 UNIT/ML IV SOLN
500.0000 [IU] | Freq: Once | INTRAVENOUS | Status: AC | PRN
  Administered 2019-07-20: 500 [IU]

## 2019-07-20 MED ORDER — SODIUM CHLORIDE 0.9 % IV SOLN: Freq: Once | INTRAVENOUS | Status: AC

## 2019-07-20 MED ORDER — SODIUM CHLORIDE 0.9 % IV SOLN
Freq: Once | INTRAVENOUS | Status: AC
  Filled 2019-07-20: qty 2

## 2019-07-20 MED ORDER — TOPOTECAN HCL CHEMO INJECTION 4 MG
1.2000 mg/m2 | Freq: Once | INTRAVENOUS | Status: AC
  Administered 2019-07-20: 2.2 mg via INTRAVENOUS
  Filled 2019-07-20: qty 2.2

## 2019-07-20 MED ORDER — SODIUM CHLORIDE 0.9% FLUSH
10.0000 mL | INTRAVENOUS | Status: DC | PRN
  Administered 2019-07-20: 10 mL

## 2019-07-20 NOTE — Patient Instructions (Signed)
[facility] Discharge Instructions for Patients Receiving Chemotherapy   Beginning January 23rd 2017 lab work for the [facility] will be done in the  Main lab at [facility] on 1st floor. If you have a lab appointment with the [facility] please come in thru the  Main Entrance and check in at the main information desk   Today you received the following chemotherapy agents Topotecan. Follow-up as scheduled. Call clinic for any questions or concerns  To help prevent nausea and vomiting after your treatment, we encourage you to take your nausea medication   If you develop nausea and vomiting, or diarrhea that is not controlled by your medication, call the clinic.  The clinic phone number is [phone]. Office hours are Monday-Friday 8:30am-5:00pm.  BELOW ARE SYMPTOMS THAT SHOULD BE REPORTED IMMEDIATELY:  *FEVER GREATER THAN 101.0 F  *CHILLS WITH OR WITHOUT FEVER  NAUSEA AND VOMITING THAT IS NOT CONTROLLED WITH YOUR NAUSEA MEDICATION  *UNUSUAL SHORTNESS OF BREATH  *UNUSUAL BRUISING OR BLEEDING  TENDERNESS IN MOUTH AND THROAT WITH OR WITHOUT PRESENCE OF ULCERS  *URINARY PROBLEMS  *BOWEL PROBLEMS  UNUSUAL RASH Items with * indicate a potential emergency and should be followed up as soon as possible. If you have an emergency after office hours please contact your primary care physician or go to the nearest emergency department.  Please call the clinic during office hours if you have any questions or concerns.   You may also contact the Patient Navigator at [phone] should you have any questions or need assistance in obtaining follow up care.      Resources For Cancer Patients and their Caregivers ? American Cancer Society: Can assist with transportation, wigs, general needs, runs Look Good Feel Better.        304-390-7679 ? Cancer Care: Provides financial assistance, online support groups, medication/co-pay assistance.  1-800-813-HOPE  (778)503-0476) ? [facility] Assists [place] cancer patients and their families through emotional , educational and financial support.  346-066-2143 ? [place] DSS Where to apply for food stamps, Medicaid and utility assistance. 901-800-7504 ? [employer]: Transportation to medical appointments. (639)176-7700 ? Social Security Administration: May apply for disability if have a Stage IV cancer. 2288414105 515-637-1386 ? [employer], Disability and Transit Services: Assists with nutrition, care and transit needs. [phone]

## 2019-07-20 NOTE — Progress Notes (Signed)
[name] tolerated Topotecan infusion well without complaints or incident. Port left accessed and flushed for use tomorrow. VSS upon discharge. Pt discharged self ambulatory in satisfactory condition

## 2019-07-21 ENCOUNTER — Encounter (HOSPITAL_COMMUNITY): Payer: Self-pay

## 2019-07-21 ENCOUNTER — Inpatient Hospital Stay (HOSPITAL_COMMUNITY): Payer: Medicare HMO

## 2019-07-21 VITALS — BP 118/67 | HR 56 | Temp 97.5°F | Resp 18

## 2019-07-21 DIAGNOSIS — C770 Secondary and unspecified malignant neoplasm of lymph nodes of head, face and neck: Secondary | ICD-10-CM | POA: Diagnosis not present

## 2019-07-21 DIAGNOSIS — C787 Secondary malignant neoplasm of liver and intrahepatic bile duct: Secondary | ICD-10-CM | POA: Diagnosis not present

## 2019-07-21 DIAGNOSIS — C3431 Malignant neoplasm of lower lobe, right bronchus or lung: Secondary | ICD-10-CM | POA: Diagnosis not present

## 2019-07-21 DIAGNOSIS — Z5111 Encounter for antineoplastic chemotherapy: Secondary | ICD-10-CM | POA: Diagnosis not present

## 2019-07-21 DIAGNOSIS — C3432 Malignant neoplasm of lower lobe, left bronchus or lung: Secondary | ICD-10-CM | POA: Diagnosis not present

## 2019-07-21 DIAGNOSIS — C3490 Malignant neoplasm of unspecified part of unspecified bronchus or lung: Secondary | ICD-10-CM

## 2019-07-21 DIAGNOSIS — D6481 Anemia due to antineoplastic chemotherapy: Secondary | ICD-10-CM | POA: Diagnosis not present

## 2019-07-21 MED ORDER — HEPARIN SOD (PORK) LOCK FLUSH 100 UNIT/ML IV SOLN
500.0000 [IU] | Freq: Once | INTRAVENOUS | Status: AC | PRN
  Administered 2019-07-21: 12:00:00 500 [IU]

## 2019-07-21 MED ORDER — SODIUM CHLORIDE 0.9 % IV SOLN: Freq: Once | INTRAVENOUS | Status: AC

## 2019-07-21 MED ORDER — SODIUM CHLORIDE 0.9 % IV SOLN
Freq: Once | INTRAVENOUS | Status: AC
  Filled 2019-07-21: qty 2

## 2019-07-21 MED ORDER — SODIUM CHLORIDE 0.9% FLUSH
10.0000 mL | INTRAVENOUS | Status: DC | PRN
  Administered 2019-07-21: 11:00:00 10 mL

## 2019-07-21 MED ORDER — TOPOTECAN HCL CHEMO INJECTION 4 MG
1.2000 mg/m2 | Freq: Once | INTRAVENOUS | Status: AC
  Administered 2019-07-21: 2.2 mg via INTRAVENOUS
  Filled 2019-07-21: qty 2.2

## 2019-07-21 NOTE — Patient Instructions (Signed)
[facility] Discharge Instructions for Patients Receiving Chemotherapy   Beginning January 23rd 2017 lab work for the [facility] will be done in the  Main lab at [facility] on 1st floor. If you have a lab appointment with the [facility] please come in thru the  Main Entrance and check in at the main information desk   Today you received the following chemotherapy agents Topotecan. Follow-up as scheduled. Call clinic for any questions or concerns  To help prevent nausea and vomiting after your treatment, we encourage you to take your nausea medication   If you develop nausea and vomiting, or diarrhea that is not controlled by your medication, call the clinic.  The clinic phone number is [phone]. Office hours are Monday-Friday 8:30am-5:00pm.  BELOW ARE SYMPTOMS THAT SHOULD BE REPORTED IMMEDIATELY:  *FEVER GREATER THAN 101.0 F  *CHILLS WITH OR WITHOUT FEVER  NAUSEA AND VOMITING THAT IS NOT CONTROLLED WITH YOUR NAUSEA MEDICATION  *UNUSUAL SHORTNESS OF BREATH  *UNUSUAL BRUISING OR BLEEDING  TENDERNESS IN MOUTH AND THROAT WITH OR WITHOUT PRESENCE OF ULCERS  *URINARY PROBLEMS  *BOWEL PROBLEMS  UNUSUAL RASH Items with * indicate a potential emergency and should be followed up as soon as possible. If you have an emergency after office hours please contact your primary care physician or go to the nearest emergency department.  Please call the clinic during office hours if you have any questions or concerns.   You may also contact the Patient Navigator at [phone] should you have any questions or need assistance in obtaining follow up care.      Resources For Cancer Patients and their Caregivers ? American Cancer Society: Can assist with transportation, wigs, general needs, runs Look Good Feel Better.        931-181-6100 ? Cancer Care: Provides financial assistance, online support groups, medication/co-pay assistance.  1-800-813-HOPE  718-471-0141) ? [place] Assists [place] cancer patients and their families through emotional , educational and financial support.  (807)060-1642 ? [place] DSS Where to apply for food stamps, Medicaid and utility assistance. 779-398-7479 ? [employer]: Transportation to medical appointments. 7572222681 ? Social Security Administration: May apply for disability if have a Stage IV cancer. 681-300-4900 9102502760 ? [employer], Disability and Transit Services: Assists with nutrition, care and transit needs. [phone]

## 2019-07-21 NOTE — Progress Notes (Signed)
Harvie Junior tolerated Topotecan infusion well without complaints or incident. Port left accessed and flushed for use tomorrow. VSS upon discharge. Pt discharged self ambulatory in satisfactory condition

## 2019-07-22 ENCOUNTER — Encounter (HOSPITAL_COMMUNITY): Payer: Self-pay

## 2019-07-22 ENCOUNTER — Other Ambulatory Visit: Payer: Self-pay

## 2019-07-22 ENCOUNTER — Inpatient Hospital Stay (HOSPITAL_COMMUNITY): Payer: Medicare HMO

## 2019-07-22 VITALS — BP 108/47 | HR 59 | Temp 96.9°F | Resp 18

## 2019-07-22 DIAGNOSIS — Z5111 Encounter for antineoplastic chemotherapy: Secondary | ICD-10-CM | POA: Diagnosis not present

## 2019-07-22 DIAGNOSIS — C770 Secondary and unspecified malignant neoplasm of lymph nodes of head, face and neck: Secondary | ICD-10-CM | POA: Diagnosis not present

## 2019-07-22 DIAGNOSIS — C3490 Malignant neoplasm of unspecified part of unspecified bronchus or lung: Secondary | ICD-10-CM

## 2019-07-22 DIAGNOSIS — C787 Secondary malignant neoplasm of liver and intrahepatic bile duct: Secondary | ICD-10-CM | POA: Diagnosis not present

## 2019-07-22 DIAGNOSIS — D6481 Anemia due to antineoplastic chemotherapy: Secondary | ICD-10-CM | POA: Diagnosis not present

## 2019-07-22 DIAGNOSIS — C3431 Malignant neoplasm of lower lobe, right bronchus or lung: Secondary | ICD-10-CM | POA: Diagnosis not present

## 2019-07-22 DIAGNOSIS — C3432 Malignant neoplasm of lower lobe, left bronchus or lung: Secondary | ICD-10-CM | POA: Diagnosis not present

## 2019-07-22 MED ORDER — PEGFILGRASTIM 6 MG/0.6ML ~~LOC~~ PSKT
6.0000 mg | PREFILLED_SYRINGE | Freq: Once | SUBCUTANEOUS | Status: AC
  Administered 2019-07-22: 6 mg via SUBCUTANEOUS

## 2019-07-22 MED ORDER — PEGFILGRASTIM 6 MG/0.6ML ~~LOC~~ PSKT
PREFILLED_SYRINGE | SUBCUTANEOUS | Status: AC
  Filled 2019-07-22: qty 0.6

## 2019-07-22 MED ORDER — HEPARIN SOD (PORK) LOCK FLUSH 100 UNIT/ML IV SOLN
500.0000 [IU] | Freq: Once | INTRAVENOUS | Status: AC | PRN
  Administered 2019-07-22: 500 [IU]

## 2019-07-22 MED ORDER — SODIUM CHLORIDE 0.9 % IV SOLN: Freq: Once | INTRAVENOUS | Status: AC

## 2019-07-22 MED ORDER — TOPOTECAN HCL CHEMO INJECTION 4 MG
1.2000 mg/m2 | Freq: Once | INTRAVENOUS | Status: AC
  Administered 2019-07-22: 11:00:00 2.2 mg via INTRAVENOUS
  Filled 2019-07-22: qty 2.2

## 2019-07-22 MED ORDER — SODIUM CHLORIDE 0.9 % IV SOLN
Freq: Once | INTRAVENOUS | Status: AC
  Filled 2019-07-22: qty 2

## 2019-07-22 MED ORDER — SODIUM CHLORIDE 0.9% FLUSH
10.0000 mL | INTRAVENOUS | Status: DC | PRN
  Administered 2019-07-22: 10 mL

## 2019-07-22 NOTE — Patient Instructions (Signed)
[facility] Discharge Instructions for Patients Receiving Chemotherapy   Beginning January 23rd 2017 lab work for the [facility] will be done in the  Main lab at [facility] on 1st floor. If you have a lab appointment with the [facility] please come in thru the  Main Entrance and check in at the main information desk   Today you received the following chemotherapy agents Topotecan as well as Neulasta on-pro. Follow-up as scheduled. Call clinic for any questions or concerns  To help prevent nausea and vomiting after your treatment, we encourage you to take your nausea medication   If you develop nausea and vomiting, or diarrhea that is not controlled by your medication, call the clinic.  The clinic phone number is [phone]. Office hours are Monday-Friday 8:30am-5:00pm.  BELOW ARE SYMPTOMS THAT SHOULD BE REPORTED IMMEDIATELY:  *FEVER GREATER THAN 101.0 F  *CHILLS WITH OR WITHOUT FEVER  NAUSEA AND VOMITING THAT IS NOT CONTROLLED WITH YOUR NAUSEA MEDICATION  *UNUSUAL SHORTNESS OF BREATH  *UNUSUAL BRUISING OR BLEEDING  TENDERNESS IN MOUTH AND THROAT WITH OR WITHOUT PRESENCE OF ULCERS  *URINARY PROBLEMS  *BOWEL PROBLEMS  UNUSUAL RASH Items with * indicate a potential emergency and should be followed up as soon as possible. If you have an emergency after office hours please contact your primary care physician or go to the nearest emergency department.  Please call the clinic during office hours if you have any questions or concerns.   You may also contact the Patient Navigator at [phone] should you have any questions or need assistance in obtaining follow up care.      Resources For Cancer Patients and their Caregivers ? [employer]: Can assist with transportation, wigs, general needs, runs Look Good Feel Better.        941 423 9677 ? Cancer Care: Provides financial assistance, online support groups, medication/co-pay  assistance.  1-800-813-HOPE 956-469-9367) ? [facility] Assists [place] cancer patients and their families through emotional , educational and financial support.  (915) 241-8809 ? [place] DSS Where to apply for food stamps, Medicaid and utility assistance. 346-019-1180 ? [facility]: Transportation to medical appointments. (724)343-0229 ? Social Security Administration: May apply for disability if have a Stage IV cancer. 507-148-2401 281-228-3462 ? [employer], Disability and Transit Services: Assists with nutrition, care and transit needs. ([phone]

## 2019-07-22 NOTE — Progress Notes (Signed)
[name] tolerated Topotecan infusion and Neulasta on-pro well without complaints or incident. Neulasta on-pro applied to pt's right arm with green indicator light flashing upon discharge. VSS upon discharge. Pt discharged self ambulatory in satisfactory condition

## 2019-07-24 DIAGNOSIS — H2511 Age-related nuclear cataract, right eye: Secondary | ICD-10-CM | POA: Diagnosis not present

## 2019-07-28 DIAGNOSIS — H2511 Age-related nuclear cataract, right eye: Secondary | ICD-10-CM | POA: Diagnosis not present

## 2019-08-15 ENCOUNTER — Inpatient Hospital Stay (HOSPITAL_COMMUNITY): Payer: Medicare HMO

## 2019-08-15 ENCOUNTER — Other Ambulatory Visit: Payer: Self-pay

## 2019-08-15 ENCOUNTER — Inpatient Hospital Stay (HOSPITAL_BASED_OUTPATIENT_CLINIC_OR_DEPARTMENT_OTHER): Payer: Medicare HMO | Admitting: Hematology

## 2019-08-15 ENCOUNTER — Inpatient Hospital Stay (HOSPITAL_COMMUNITY): Payer: Medicare HMO | Attending: Hematology

## 2019-08-15 ENCOUNTER — Encounter (HOSPITAL_COMMUNITY): Payer: Self-pay | Admitting: Hematology

## 2019-08-15 VITALS — BP 108/54 | HR 52 | Temp 97.6°F | Resp 18

## 2019-08-15 DIAGNOSIS — C3490 Malignant neoplasm of unspecified part of unspecified bronchus or lung: Secondary | ICD-10-CM | POA: Diagnosis not present

## 2019-08-15 DIAGNOSIS — C787 Secondary malignant neoplasm of liver and intrahepatic bile duct: Secondary | ICD-10-CM | POA: Insufficient documentation

## 2019-08-15 DIAGNOSIS — F17210 Nicotine dependence, cigarettes, uncomplicated: Secondary | ICD-10-CM | POA: Insufficient documentation

## 2019-08-15 DIAGNOSIS — J449 Chronic obstructive pulmonary disease, unspecified: Secondary | ICD-10-CM | POA: Diagnosis not present

## 2019-08-15 DIAGNOSIS — Z5189 Encounter for other specified aftercare: Secondary | ICD-10-CM | POA: Insufficient documentation

## 2019-08-15 DIAGNOSIS — Z5111 Encounter for antineoplastic chemotherapy: Secondary | ICD-10-CM | POA: Insufficient documentation

## 2019-08-15 LAB — CBC WITH DIFFERENTIAL/PLATELET
Abs Immature Granulocytes: 0.03 10*3/uL (ref 0.00–0.07)
Basophils Absolute: 0 10*3/uL (ref 0.0–0.1)
Basophils Relative: 1 %
Eosinophils Absolute: 0.1 10*3/uL (ref 0.0–0.5)
Eosinophils Relative: 1 %
HCT: 29.4 % — ABNORMAL LOW (ref 39.0–52.0)
Hemoglobin: 9.1 g/dL — ABNORMAL LOW (ref 13.0–17.0)
Immature Granulocytes: 0 %
Lymphocytes Relative: 17 %
Lymphs Abs: 1.4 10*3/uL (ref 0.7–4.0)
MCH: 30.1 pg (ref 26.0–34.0)
MCHC: 31 g/dL (ref 30.0–36.0)
MCV: 97.4 fL (ref 80.0–100.0)
Monocytes Absolute: 0.9 10*3/uL (ref 0.1–1.0)
Monocytes Relative: 12 %
Neutro Abs: 5.4 10*3/uL (ref 1.7–7.7)
Neutrophils Relative %: 69 %
Platelets: 378 10*3/uL (ref 150–400)
RBC: 3.02 MIL/uL — ABNORMAL LOW (ref 4.22–5.81)
RDW: 22.8 % — ABNORMAL HIGH (ref 11.5–15.5)
WBC: 7.8 10*3/uL (ref 4.0–10.5)
nRBC: 0 % (ref 0.0–0.2)

## 2019-08-15 LAB — COMPREHENSIVE METABOLIC PANEL
ALT: 15 U/L (ref 0–44)
AST: 16 U/L (ref 15–41)
Albumin: 4.1 g/dL (ref 3.5–5.0)
Alkaline Phosphatase: 61 U/L (ref 38–126)
Anion gap: 9 (ref 5–15)
BUN: 20 mg/dL (ref 8–23)
CO2: 24 mmol/L (ref 22–32)
Calcium: 9.2 mg/dL (ref 8.9–10.3)
Chloride: 105 mmol/L (ref 98–111)
Creatinine, Ser: 0.88 mg/dL (ref 0.61–1.24)
GFR calc Af Amer: >60 mL/min (ref >60)
GFR calc non Af Amer: >60 mL/min (ref >60)
Glucose, Bld: 108 mg/dL — ABNORMAL HIGH (ref 70–99)
Potassium: 4 mmol/L (ref 3.5–5.1)
Sodium: 138 mmol/L (ref 135–145)
Total Bilirubin: 0.6 mg/dL (ref 0.3–1.2)
Total Protein: 6.6 g/dL (ref 6.5–8.1)

## 2019-08-15 MED ORDER — SODIUM CHLORIDE 0.9% FLUSH
10.0000 mL | INTRAVENOUS | Status: DC | PRN
  Administered 2019-08-15: 13:00:00 10 mL

## 2019-08-15 MED ORDER — SODIUM CHLORIDE 0.9 % IV SOLN
Freq: Once | INTRAVENOUS | Status: AC
  Filled 2019-08-15: qty 2

## 2019-08-15 MED ORDER — OXYCODONE-ACETAMINOPHEN 5-325 MG PO TABS: ORAL_TABLET | ORAL | 0 refills | Status: DC

## 2019-08-15 MED ORDER — TOPOTECAN HCL CHEMO INJECTION 4 MG
1.2000 mg/m2 | Freq: Once | INTRAVENOUS | Status: AC
  Administered 2019-08-15: 2.2 mg via INTRAVENOUS
  Filled 2019-08-15: qty 2.2

## 2019-08-15 MED ORDER — HEPARIN SOD (PORK) LOCK FLUSH 100 UNIT/ML IV SOLN
500.0000 [IU] | Freq: Once | INTRAVENOUS | Status: AC | PRN
  Administered 2019-08-15: 500 [IU]

## 2019-08-15 MED ORDER — SODIUM CHLORIDE 0.9 % IV SOLN: Freq: Once | INTRAVENOUS | Status: AC

## 2019-08-15 NOTE — Patient Instructions (Signed)
[facility] at [facility]  Discharge Instructions:   _______________________________________________________________  Thank you for choosing [facility] at [facility] to provide your oncology and hematology care.  To afford each patient quality time with our providers, please arrive at least 15 minutes before your scheduled appointment.  You need to re-schedule your appointment if you arrive 10 or more minutes late.  We strive to give you quality time with our providers, and arriving late affects you and other patients whose appointments are after yours.  Also, if you no show three or more times for appointments you may be dismissed from the clinic.  Again, thank you for choosing [facility] at [facility]. Our hope is that these requests will allow you access to exceptional care and in a timely manner. _______________________________________________________________  If you have questions after your visit, please contact our office at [phone] between the hours of 8:30 a.m. and 5:00 p.m. Voicemails left after 4:30 p.m. will not be returned until the following business day. _______________________________________________________________  For prescription refill requests, have your pharmacy contact our office. _______________________________________________________________  Recommendations made by the consultant and any test results will be sent to your referring physician. _______________________________________________________________ 

## 2019-08-15 NOTE — Patient Instructions (Addendum)
[place] at [facility] Discharge Instructions  You were seen today by Dr. Delton Coombes. He went over your recent lab results. He will repeat your scans prior to your next visit. He will see you back in 3 weeks for labs and follow up.   Thank you for choosing [place] at [facility] to provide your oncology and hematology care.  To afford each patient quality time with our provider, please arrive at least 15 minutes before your scheduled appointment time.   If you have a lab appointment with the [facility] please come in thru the  Main Entrance and check in at the main information desk  You need to re-schedule your appointment should you arrive 10 or more minutes late.  We strive to give you quality time with our providers, and arriving late affects you and other patients whose appointments are after yours.  Also, if you no show three or more times for appointments you may be dismissed from the clinic at the providers discretion.     Again, thank you for choosing [facility].  Our hope is that these requests will decrease the amount of time that you wait before being seen by our physicians.       _____________________________________________________________  Should you have questions after your visit to [facility], please contact our office at [phone] between the hours of 8:00 a.m. and 4:30 p.m.  Voicemails left after 4:00 p.m. will not be returned until the following business day.  For prescription refill requests, have your pharmacy contact our office and allow 72 hours.    Cancer Center Support Programs:   > Cancer Support Group  2nd Tuesday of the month 1pm-2pm, Journey Room

## 2019-08-15 NOTE — Assessment & Plan Note (Signed)
1.  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 -CT CAP on 06/07/2019 showed stable bilateral pulmonary nodules with stable lymph nodes.  No evidence of active malignancy in the abdomen or pelvis. -He has tolerated cycle 26 on 07/18/2019 without any major problems. -We reviewed his labs.  He will proceed with his next cycle today. -I plan to repeat his CT chest abdomen and pelvis and brain imaging prior to next visit.  He was treated with prophylactic cranial radiation therapy.  2.  Chronic pain/constipation: -He is taking Percocet 1 tablet in the morning.  If he takes more than 1 tablet, he will get severely constipated. -He is also continuing laxative tablets.  3.  Peripheral neuropathy: -He will continue gabapentin 600 mg 3 times a day.  4.  Normocytic anemia: -This is chemotherapy-induced.  Today hemoglobin is 8.8.  No transfusion necessary. -Labs on 05/09/2019 shows normal L84, folic acid and ferritin.

## 2019-08-15 NOTE — Progress Notes (Signed)
Patient has been assessed, vital signs and labs have been reviewed by Dr. Katragadda. ANC, Creatinine, LFTs, and Platelets are within treatment parameters per Dr. Katragadda. The patient is good to proceed with treatment at this time.  

## 2019-08-15 NOTE — Progress Notes (Signed)
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 Bulky 5.4 cm right supraclavicular region malignant lymph node conglomeration with extracapsular extension. 2. Surrounding smaller abnormal right level 3 and level 5 lymph nodes, and the lymphadenopathy continues into the superior mediastinum, see Chest CT findings reported separately. 3. No other metastatic disease identified in the neck.   01/16/2017 Imaging   CT chest: IMPRESSION: 1. Extensive lymphadenopathy in the thorax and lower right cervical region, as discussed above. Primary differential considerations include lymphoma/leukemia or small cell carcinoma of the lung. Further evaluation a PET-CT could be considered to assess for additional sites of disease below the diaphragm if clinically appropriate. Additionally, ultrasound-guided biopsy of supraclavicular lymphadenopathy could be considered to establish a tissue diagnosis. 2. Indeterminate lesion in the periphery of segment 8 of the liver measuring 2.7 x 1.7 cm. Attention at time of follow-up PET-CT is recommended. 3. Aortic atherosclerosis, in addition to left main and 3 vessel coronary artery disease. Please note that although the presence of coronary artery calcium documents the presence of coronary artery disease, the severity of this disease and any potential stenosis cannot be assessed on this non-gated CT examination. Assessment for potential risk factor modification, dietary therapy or pharmacologic therapy may be warranted, if clinically indicated. 4. There are calcifications of the aortic valve.  Echocardiographic correlation for evaluation of potential valvular dysfunction may be warranted if clinically indicated. 5. Diffuse bronchial wall thickening with moderate centrilobular and paraseptal emphysema; imaging findings suggestive of underlying COPD.   02/03/2017 Initial Biopsy   (R) neck lymph node biopsy: SMALL CELL CARCINOMA (most likely lung primary).    02/03/2017 Miscellaneous   Port-a-cath attempted by IR; unable to place d/t enlarged SVC.    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 Staging for metastatic disease  EXAM: MRI HEAD WITHOUT AND WITH CONTRAST  TECHNIQUE: Multiplanar, multiecho pulse sequences of the brain and surrounding structures were obtained without and with intravenous contrast.  CONTRAST:  57m MULTIHANCE GADOBENATE DIMEGLUMINE 529 MG/ML IV SOLN  COMPARISON:  None.  FINDINGS: Brain: Negative for hydrocephalus. Cerebral volume normal for age. Small nonenhancing white matter hyperintensities consistent with mild chronic microvascular ischemia. No acute infarct. Negative for hemorrhage or mass or edema  Normal enhancement postcontrast infusion. No enhancing mass lesion. Leptomeningeal enhancement is normal.  Vascular: Normal arterial flow voids.  Normal venous enhancement  Skull and upper cervical spine: Negative  Sinuses/Orbits: Negative  Other: None  IMPRESSION: Negative for metastatic disease.  No acute abnormality.  Mild chronic white matter changes.   04/07/2017 Imaging    PET:  1. Marked reduction in size and metabolic activity of bulky RIGHT supraclavicular adenopathy mediastinal lymphadenopathy. 2. Residual moderate activity remains within small RIGHT supraclavicular lymph node, RIGHT lower paratracheal  lymph node and RIGHT hilar lymph node. 3. Resolution of prevascular and internal mammary mediastinal metastatic hypermetabolic activity. 4. Resolution of metabolic activity associated with solitary RIGHT  hepatic lobe liver metastasis. 5. No evidence of disease progression. 6. 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   05/27/2017 Imaging   MRI brain w/ and w/o contrast IMPRESSION: 1. No metastatic disease identified. 2. Increased nonspecific cerebral white matter signal changes since August. These are most commonly small vessel disease related. 3. New right maxillary sinusitis. Benign appearing retention cysts in the nasopharynx with trace mastoid effusions.   06/12/2017 Imaging   PET-CT IMPRESSION: 1. There are two new hypermetabolic nodules identified within both lower lobes measuring up to 3.1 cm. The appearance is nonspecific and may be inflammatory/infectious in etiology. Pulmonary metastatic disease cannot be excluded and short-term follow-up imaging in 3 months is advised to reassess these nodules. 2. Stable appearance of mild hypermetabolic activity associated with right paratracheal and right hilar lymph nodes. 3. Decrease in FDG uptake associated with index right supraclavicular lymph node. 4. 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 5. Aortic Atherosclerosis (ICD10-I70.0) and Emphysema (ICD10-J43.9).   08/24/2017 -  Chemotherapy   The patient had pegfilgrastim (NEULASTA ONPRO KIT) injection 6 mg, 6 mg, Subcutaneous, Once, 27 of 27 cycles Administration: 6 mg (08/28/2017), 6 mg (09/18/2017), 6 mg (10/09/2017), 6 mg (11/02/2017), 6 mg (11/20/2017), 6 mg (12/18/2017), 6 mg (01/15/2018), 6 mg (02/12/2018), 6 mg (03/12/2018), 6 mg (04/09/2018), 6 mg (05/07/2018), 6 mg (06/04/2018), 6 mg  (07/02/2018), 6 mg (07/30/2018), 6 mg (08/27/2018), 6 mg (09/24/2018), 6 mg (10/29/2018), 6 mg (11/26/2018), 6 mg (12/24/2018), 6 mg (01/21/2019), 6 mg (02/18/2019), 6 mg (03/18/2019), 6 mg (04/15/2019), 6 mg (05/13/2019), 6 mg (06/17/2019), 6 mg (07/22/2019) topotecan (HYCAMTIN) 2.9 mg in sodium chloride 0.9 % 100 mL chemo infusion, 1.5 mg/m2 = 2.9 mg, Intravenous,  Once, 27 of 27 cycles Dose modification: 1.2 mg/m2 (80 % of original dose 1.5 mg/m2, Cycle 4, Reason: Dose Not Tolerated) Administration: 2.9 mg (08/24/2017), 2.9 mg (08/25/2017), 2.9 mg (08/28/2017), 2.9 mg (08/26/2017), 2.9 mg (08/27/2017), 2.9 mg (09/14/2017), 2.9 mg (09/15/2017), 2.9 mg (09/16/2017), 2.9 mg (09/17/2017), 2.9 mg (09/18/2017), 2.9 mg (10/05/2017), 2.9 mg (10/06/2017), 2.9 mg (10/07/2017), 2.9 mg (10/08/2017), 2.9 mg (10/09/2017), 2.3 mg (10/26/2017), 2.3 mg (10/27/2017), 2.3 mg (10/28/2017), 2.3 mg (10/29/2017), 2.3 mg (11/02/2017), 2.3 mg (11/16/2017), 2.3 mg (11/17/2017), 2.3 mg (11/18/2017), 2.3 mg (11/19/2017), 2.3 mg (11/20/2017), 2.3 mg (12/14/2017), 2.3 mg (12/15/2017), 2.3 mg (12/16/2017), 2.3 mg (12/17/2017), 2.3 mg (12/18/2017), 2.3 mg (01/11/2018), 2.3 mg (01/12/2018), 2.3 mg (01/13/2018), 2.3 mg (01/15/2018), 2.3 mg (02/08/2018), 2.3 mg (02/09/2018), 2.3 mg (02/10/2018), 2.3 mg (02/11/2018), 2.3 mg (02/12/2018), 2.3 mg (03/08/2018), 2.3 mg (03/09/2018), 2.3 mg (03/10/2018), 2.3 mg (03/11/2018), 2.3 mg (03/12/2018), 2.2 mg (04/05/2018), 2.2 mg (04/06/2018), 2.2 mg (04/07/2018), 2.2 mg (04/08/2018), 2.2 mg (04/09/2018), 2.2 mg (05/03/2018), 2.2 mg (05/04/2018), 2.2 mg (05/05/2018), 2.2 mg (05/06/2018), 2.2 mg (05/07/2018), 2.2 mg (05/31/2018), 2.2 mg (06/01/2018), 2.2 mg (06/02/2018), 2.2 mg (06/03/2018), 2.2 mg (06/04/2018), 2.2 mg (06/28/2018), 2.2 mg (06/29/2018), 2.2 mg (06/30/2018), 2.2 mg (07/01/2018), 2.2 mg (07/02/2018), 2.2 mg (07/26/2018), 2.2 mg (07/27/2018), 2.2 mg (07/28/2018), 2.2 mg (07/29/2018), 2.2 mg (07/30/2018), 2.2 mg (08/23/2018), 2.2 mg (08/24/2018), 2.2 mg (08/25/2018), 2.2 mg  (08/26/2018), 2.2 mg (08/27/2018), 2.2 mg (09/20/2018), 2.2 mg (09/21/2018), 2.2 mg (09/22/2018), 2.2 mg (09/23/2018), 2.2 mg (09/24/2018), 2.2 mg (10/25/2018), 2.2 mg (10/26/2018), 2.2 mg (10/27/2018), 2.2 mg (10/28/2018), 2.2 mg (10/29/2018), 2.2 mg (11/22/2018), 2.2 mg (11/23/2018),  2.2 mg (11/24/2018), 2.2 mg (11/25/2018), 2.2 mg (11/26/2018), 2.2 mg (12/20/2018), 2.2 mg (12/21/2018), 2.2 mg (12/22/2018), 2.2 mg (12/23/2018), 2.2 mg (12/24/2018), 2.2 mg (01/17/2019), 2.2 mg (01/18/2019), 2.2 mg (01/19/2019), 2.2 mg (01/20/2019), 2.2 mg (01/21/2019), 2.2 mg (02/14/2019), 2.2 mg (02/15/2019), 2.2 mg (02/16/2019), 2.2 mg (02/17/2019), 2.2 mg (02/18/2019), 2.2 mg (03/14/2019), 2.2 mg (03/15/2019), 2.2 mg (03/16/2019), 2.2 mg (03/17/2019), 2.2 mg (03/18/2019), 2.2 mg (04/11/2019), 2.2 mg (04/12/2019), 2.2 mg (04/13/2019), 2.2 mg (04/14/2019), 2.2 mg (04/15/2019), 2.2 mg (05/09/2019), 2.2 mg (05/10/2019), 2.2 mg (05/11/2019), 2.2 mg (05/12/2019), 2.2 mg (05/13/2019), 2.2 mg (06/13/2019), 2.2 mg (06/14/2019), 2.2 mg (06/15/2019), 2.2 mg (06/16/2019), 2.2 mg (06/17/2019), 2.2 mg (07/18/2019), 2.2 mg (07/19/2019), 2.2 mg (07/20/2019), 2.2 mg (07/21/2019), 2.2 mg (07/22/2019), 2.2 mg (08/15/2019) ondansetron (ZOFRAN) 4 mg in sodium chloride 0.9 % 50 mL IVPB, , Intravenous,  Once, 22 of 22 cycles Administration:  (02/08/2018),  (02/09/2018),  (02/10/2018),  (02/11/2018),  (02/12/2018),  (03/08/2018),  (03/09/2018),  (03/10/2018),  (03/11/2018),  (03/12/2018),  (04/05/2018),  (04/06/2018),  (04/07/2018),  (04/08/2018),  (04/09/2018),  (05/03/2018),  (05/04/2018),  (05/05/2018),  (05/06/2018),  (05/07/2018),  (05/31/2018),  (06/01/2018),  (06/02/2018),  (06/03/2018),  (06/04/2018),  (06/28/2018),  (06/29/2018),  (06/30/2018),  (07/01/2018),  (07/02/2018),  (07/26/2018),  (07/27/2018),  (07/28/2018),  (07/29/2018),  (07/30/2018),  (08/23/2018),  (08/24/2018),  (08/25/2018),  (08/26/2018),  (08/27/2018),  (09/20/2018),  (09/21/2018),  (09/22/2018),  (09/23/2018),  (09/24/2018),  (10/25/2018),  (10/26/2018),  (10/27/2018),   (10/28/2018),  (10/29/2018),  (11/22/2018),  (11/23/2018),  (11/24/2018),  (11/25/2018),  (11/26/2018),  (12/20/2018),  (12/21/2018),  (12/22/2018),  (12/23/2018),  (12/24/2018),  (01/17/2019),  (01/18/2019),  (01/19/2019),  (01/20/2019),  (01/21/2019),  (02/14/2019),  (02/15/2019),  (02/16/2019),  (02/17/2019),  (02/18/2019),  (03/14/2019),  (03/15/2019),  (03/16/2019),  (03/17/2019),  (03/18/2019),  (04/11/2019),  (04/12/2019),  (04/13/2019),  (04/14/2019),  (04/15/2019),  (05/09/2019),  (05/10/2019),  (05/11/2019),  (05/12/2019),  (05/13/2019),  (06/13/2019),  (06/14/2019),  (06/15/2019),  (06/16/2019),  (06/17/2019),  (07/18/2019),  (07/19/2019),  (07/20/2019),  (07/21/2019),  (07/22/2019),  (08/15/2019)  for chemotherapy treatment.         INTERVAL HISTORY:  [name] 69 y.o. male seen for follow-up of extensive stage small cell lung cancer.  He is tolerating topotecan reasonably well.  Denies any new onset pains.  For his chronic pains he is taking Percocet 1 tablet daily.  Appetite is 100%.  Energy levels are 75%.  Occasional dizziness is stable.  REVIEW OF SYSTEMS:  Review of Systems  Gastrointestinal: Positive for constipation.  Neurological: Positive for numbness.  All other systems reviewed and are negative.    PAST MEDICAL/SURGICAL HISTORY:  Past Medical History:  Diagnosis Date  . Anxiety   . CAD (coronary artery disease)   . Cancer ([place])    stage 4 small cell lung cancer   . COPD (chronic obstructive pulmonary disease) ([place])   . Depression   . Dyspnea    increased exertion  . Feeling of chest tightness   . Heart palpitations   . History of chemotherapy   . Myocardial infarction ([place])   . Osteopenia   . Panic attacks   . Smoker    Past Surgical History:  Procedure Laterality Date  . BACK SURGERY  12/24/2000   L5,S1  . CORONARY STENT PLACEMENT  2005   RCA & CX  . HERNIA REPAIR Right 1980's  . INGUINAL HERNIA REPAIR  12/1978   right side  . IR FLUORO GUIDE PORT INSERTION RIGHT  04/02/2017  . IR US GUIDE BX  ASP/DRAIN   02/03/2017  . IR US GUIDE VASC ACCESS RIGHT  04/02/2017  . NM MYOCAR PERF WALL MOTION  09/07/2009   No ischemia; EF 51%  . SHOULDER SURGERY Left 08/2010  . SPINE SURGERY  2002   L5-S1     SOCIAL HISTORY:  Social History   Socioeconomic History  . Marital status: Single    Spouse name: Not on file  . Number of children: Not on file  . Years of education: Not on file  . Highest education level: Not on file  Occupational History  . Not on file  Tobacco Use  . Smoking status: Current Every Day Smoker    Packs/day: 1.00    Types: Cigarettes  . Smokeless tobacco: Never Used  Substance and Sexual Activity  . Alcohol use: Yes    Comment: occas  . Drug use: No  . Sexual activity: Not on file  Other Topics Concern  . Not on file  Social History Narrative  . Not on file   Social Determinants of Health   Financial Resource Strain:   . Difficulty of Paying Living Expenses: Not on file  Food Insecurity:   . Worried About Charity fundraiser in the Last Year: Not on file  . Ran Out of Food in the Last Year: Not on file  Transportation Needs:   . Lack of Transportation (Medical): Not on file  . Lack of Transportation (Non-Medical): Not on file  Physical Activity:   . Days of Exercise per Week: Not on file  . Minutes of Exercise per Session: Not on file  Stress:   . Feeling of Stress : Not on file  Social Connections:   . Frequency of Communication with Friends and Family: Not on file  . Frequency of Social Gatherings with Friends and Family: Not on file  . Attends Religious Services: Not on file  . Active Member of Clubs or Organizations: Not on file  . Attends Archivist Meetings: Not on file  . Marital Status: Not on file  Intimate Partner Violence:   . Fear of Current or Ex-Partner: Not on file  . Emotionally Abused: Not on file  . Physically Abused: Not on file  . Sexually Abused: Not on file    FAMILY HISTORY:  Family History  Problem Relation Age of  Onset  . Heart attack Father   . Kidney disease Father        renal failure  . Heart failure Mother   . Heart attack Mother   . Cancer Brother   . Diabetes Brother   . Alcohol abuse Brother   . Diabetes Sister     CURRENT MEDICATIONS:  Outpatient Encounter Medications as of 08/15/2019  Medication Sig  . aspirin EC 81 MG tablet Take 81 mg by mouth daily.  Marland Kitchen atorvastatin (LIPITOR) 40 MG tablet TAKE (1) TABLET BY MOUTH ONCE DAILY.  . cholecalciferol (VITAMIN D3) 25 MCG (1000 UT) tablet Take 1,000 Units by mouth daily.  . clotrimazole-betamethasone (LOTRISONE) cream Apply 1 application topically 2 (two) times daily.  Marland Kitchen gabapentin (NEURONTIN) 300 MG capsule Take 2 capsules (600 mg total) by mouth 3 (three) times daily.  Marland Kitchen LOTEMAX 0.5 % ophthalmic suspension Place 1 drop into the left eye 4 (four) times daily. Pt has not started yet  . Pegfilgrastim (NEULASTA ONPRO [place]) Inject into the skin. Every 21 days  . polyethylene glycol powder (GLYCOLAX/MIRALAX) powder Take 17 g by mouth daily.  Marland Kitchen PROLENSA 0.07 % SOLN  Place 1 drop into the left eye daily. Pt has not started yet  . topotecan in sodium chloride 0.9 % 100 mL Inject into the vein once. Days 1-5 every 21 days  . vitamin B-12 (CYANOCOBALAMIN) 100 MCG tablet Take 100 mcg by mouth daily.  Marland Kitchen albuterol (VENTOLIN HFA) 108 (90 Base) MCG/ACT inhaler Inhale 2 puffs into the lungs every 6 (six) hours as needed for wheezing or shortness of breath.  . ALPRAZolam (XANAX) 1 MG tablet Take 1 tablet (1 mg total) by mouth 4 (four) times daily as needed for anxiety. (Patient not taking: Reported on 07/18/2019)  . BESIVANCE 0.6 % SUSP Pt has not started yet  . hydrOXYzine (ATARAX/VISTARIL) 50 MG tablet TAKE ONE TABLET BY MOUTH AT BEDTIME AS NEEDED FOR SLEEP. (Patient not taking: Reported on 07/18/2019)  . ondansetron (ZOFRAN) 8 MG tablet Take 1 tablet (8 mg total) by mouth 2 (two) times daily as needed. Start on the third day after cisplatin chemotherapy. (Patient  not taking: Reported on 05/09/2019)  . oxyCODONE-acetaminophen (PERCOCET/ROXICET) 5-325 MG tablet TAKE 1 TABLET EVERY 8 HOURS AS NEEDED FOR SEVERE PAIN.  Marland Kitchen prochlorperazine (COMPAZINE) 10 MG tablet Take 1 tablet (10 mg total) by mouth every 6 (six) hours as needed (Nausea or vomiting). (Patient not taking: Reported on 05/09/2019)  . [DISCONTINUED] oxyCODONE-acetaminophen (PERCOCET/ROXICET) 5-325 MG tablet TAKE 1 TABLET EVERY 8 HOURS AS NEEDED FOR SEVERE PAIN. (Patient not taking: Reported on 07/18/2019)   No facility-administered encounter medications on file as of 08/15/2019.    ALLERGIES:  Allergies  Allergen Reactions  . Codeine Nausea Only  . Niaspan [Niacin Er]      PHYSICAL EXAM:  ECOG Performance status: 1  There were no vitals filed for this visit. There were no vitals filed for this visit.  Physical Exam Vitals reviewed.  Constitutional:      Appearance: Normal appearance.  Cardiovascular:     Rate and Rhythm: Normal rate and regular rhythm.     Heart sounds: Normal heart sounds.  Pulmonary:     Effort: Pulmonary effort is normal.     Breath sounds: Normal breath sounds.  Abdominal:     General: There is no distension.     Palpations: Abdomen is soft. There is no mass.  Musculoskeletal:        General: No swelling.  Skin:    General: Skin is warm.  Neurological:     General: No focal deficit present.     Mental Status: He is alert and oriented to person, place, and time.  Psychiatric:        Mood and Affect: Mood normal.        Behavior: Behavior normal.      LABORATORY DATA:  I have reviewed the labs as listed.  CBC    Component Value Date/Time   WBC 7.8 08/15/2019 0938   RBC 3.02 (L) 08/15/2019 0938   HGB 9.1 (L) 08/15/2019 0938   HCT 29.4 (L) 08/15/2019 0938   PLT 378 08/15/2019 0938   MCV 97.4 08/15/2019 0938   MCH 30.1 08/15/2019 0938   MCHC 31.0 08/15/2019 0938   RDW 22.8 (H) 08/15/2019 0938   LYMPHSABS 1.4 08/15/2019 0938   MONOABS 0.9  08/15/2019 0938   EOSABS 0.1 08/15/2019 0938   BASOSABS 0.0 08/15/2019 0938   CMP Latest Ref Rng & Units 08/15/2019 07/18/2019 06/13/2019  Glucose 70 - 99 mg/dL 108(H) 95 88  BUN 8 - 23 mg/dL '20 14 18  '$ Creatinine 0.61 - 1.24  mg/dL 0.88 0.77 0.93  Sodium 135 - 145 mmol/L 138 136 137  Potassium 3.5 - 5.1 mmol/L 4.0 4.0 3.7  Chloride 98 - 111 mmol/L 105 106 104  CO2 22 - 32 mmol/L '24 24 23  '$ Calcium 8.9 - 10.3 mg/dL 9.2 8.6(L) 8.7(L)  Total Protein 6.5 - 8.1 g/dL 6.6 6.4(L) 6.8  Total Bilirubin 0.3 - 1.2 mg/dL 0.6 0.4 0.5  Alkaline Phos 38 - 126 U/L 61 50 52  AST 15 - 41 U/L '16 15 17  '$ ALT 0 - 44 U/L '15 14 13    '$ I have reviewed his scans independently and discussed with the patient.   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 -CT CAP on 06/07/2019 showed stable bilateral pulmonary nodules with stable lymph nodes.  No evidence of active malignancy in the abdomen or pelvis. -He has tolerated cycle 26 on 07/18/2019 without any major problems. -We reviewed his labs.  He will proceed with his next cycle today. -I plan to repeat his CT chest abdomen and pelvis and brain imaging prior to next visit.  He was treated with prophylactic cranial radiation therapy.  2.  Chronic pain/constipation: -He is taking Percocet 1 tablet in the morning.  If he takes more than 1 tablet, he will get severely constipated. -He is also continuing laxative tablets.  3.  Peripheral neuropathy: -He will continue gabapentin 600 mg 3 times a day.  4.  Normocytic anemia: -This is chemotherapy-induced.  Today hemoglobin is 8.8.  No transfusion necessary. -Labs on 05/09/2019 shows normal K59, folic acid and ferritin.     Orders placed this encounter:  Orders Placed This  Encounter  Procedures  . CT Abdomen Pelvis W Contrast  . CT Chest W Contrast  . MR Brain W Wo Contrast  . CBC with Differential/Platelet  . Comprehensive metabolic panel      Derek Jack, MD [place] [phone]

## 2019-08-15 NOTE — Progress Notes (Signed)
Patient presents today for treament and follow visit with Dr. Delton Coombes. MAR reviewed and updated. Vital signs within parameters for treatment. Labs pending.   Message received from [facility] LPN/ Dr. Delton Coombes. Proceed with treatment. Labs reviewed and within parameters for treatment.   Treatment given today per MD orders. Tolerated infusion without adverse affects. Vital signs stable. No complaints at this time. Discharged from clinic ambulatory. F/U with [facility] as scheduled.

## 2019-08-16 ENCOUNTER — Inpatient Hospital Stay (HOSPITAL_COMMUNITY): Payer: Medicare HMO

## 2019-08-16 VITALS — BP 124/50 | HR 52 | Temp 96.9°F | Resp 18

## 2019-08-16 DIAGNOSIS — C3490 Malignant neoplasm of unspecified part of unspecified bronchus or lung: Secondary | ICD-10-CM

## 2019-08-16 DIAGNOSIS — C787 Secondary malignant neoplasm of liver and intrahepatic bile duct: Secondary | ICD-10-CM | POA: Diagnosis not present

## 2019-08-16 DIAGNOSIS — Z5111 Encounter for antineoplastic chemotherapy: Secondary | ICD-10-CM | POA: Diagnosis not present

## 2019-08-16 DIAGNOSIS — F17210 Nicotine dependence, cigarettes, uncomplicated: Secondary | ICD-10-CM | POA: Diagnosis not present

## 2019-08-16 DIAGNOSIS — Z5189 Encounter for other specified aftercare: Secondary | ICD-10-CM | POA: Diagnosis not present

## 2019-08-16 MED ORDER — TOPOTECAN HCL CHEMO INJECTION 4 MG
1.2000 mg/m2 | Freq: Once | INTRAVENOUS | Status: AC
  Administered 2019-08-16: 2.2 mg via INTRAVENOUS
  Filled 2019-08-16: qty 2.2

## 2019-08-16 MED ORDER — HEPARIN SOD (PORK) LOCK FLUSH 100 UNIT/ML IV SOLN
500.0000 [IU] | Freq: Once | INTRAVENOUS | Status: AC | PRN
  Administered 2019-08-16: 13:00:00 500 [IU]

## 2019-08-16 MED ORDER — SODIUM CHLORIDE 0.9 % IV SOLN
Freq: Once | INTRAVENOUS | Status: AC
  Filled 2019-08-16: qty 2

## 2019-08-16 MED ORDER — SODIUM CHLORIDE 0.9 % IV SOLN: Freq: Once | INTRAVENOUS | Status: AC

## 2019-08-16 MED ORDER — SODIUM CHLORIDE 0.9% FLUSH
10.0000 mL | INTRAVENOUS | Status: DC | PRN
  Administered 2019-08-16: 11:00:00 10 mL

## 2019-08-16 NOTE — Progress Notes (Signed)
Patient presents today for Day 2 Topotecan. Patient has no complaints of any changes since yesterday's treatment. Vital signs within parameters for treatment.   Treatment given today per MD orders. Tolerated infusion without adverse affects. Vital signs stable. No complaints at this time. Discharged from clinic ambulatory. F/U with [facility] as scheduled.

## 2019-08-16 NOTE — Patient Instructions (Signed)
[facility] Discharge Instructions for Patients Receiving Chemotherapy  Today you received the following chemotherapy agents   To help prevent nausea and vomiting after your treatment, we encourage you to take your nausea medication   If you develop nausea and vomiting that is not controlled by your nausea medication, call the clinic.   BELOW ARE SYMPTOMS THAT SHOULD BE REPORTED IMMEDIATELY:  *FEVER GREATER THAN 100.5 F  *CHILLS WITH OR WITHOUT FEVER  NAUSEA AND VOMITING THAT IS NOT CONTROLLED WITH YOUR NAUSEA MEDICATION  *UNUSUAL SHORTNESS OF BREATH  *UNUSUAL BRUISING OR BLEEDING  TENDERNESS IN MOUTH AND THROAT WITH OR WITHOUT PRESENCE OF ULCERS  *URINARY PROBLEMS  *BOWEL PROBLEMS  UNUSUAL RASH Items with * indicate a potential emergency and should be followed up as soon as possible.  Feel free to call the clinic should you have any questions or concerns. The clinic phone number is [phone].  Please show the CHEMO ALERT CARD at check-in to the Emergency Department and triage nurse.   

## 2019-08-17 ENCOUNTER — Inpatient Hospital Stay (HOSPITAL_COMMUNITY): Payer: Medicare HMO

## 2019-08-17 ENCOUNTER — Other Ambulatory Visit: Payer: Self-pay

## 2019-08-17 VITALS — BP 119/52 | HR 50 | Temp 96.9°F | Resp 18

## 2019-08-17 DIAGNOSIS — Z5111 Encounter for antineoplastic chemotherapy: Secondary | ICD-10-CM | POA: Diagnosis not present

## 2019-08-17 DIAGNOSIS — C787 Secondary malignant neoplasm of liver and intrahepatic bile duct: Secondary | ICD-10-CM | POA: Diagnosis not present

## 2019-08-17 DIAGNOSIS — C3490 Malignant neoplasm of unspecified part of unspecified bronchus or lung: Secondary | ICD-10-CM

## 2019-08-17 DIAGNOSIS — Z5189 Encounter for other specified aftercare: Secondary | ICD-10-CM | POA: Diagnosis not present

## 2019-08-17 DIAGNOSIS — F17210 Nicotine dependence, cigarettes, uncomplicated: Secondary | ICD-10-CM | POA: Diagnosis not present

## 2019-08-17 MED ORDER — SODIUM CHLORIDE 0.9 % IV SOLN
Freq: Once | INTRAVENOUS | Status: AC
  Filled 2019-08-17: qty 2

## 2019-08-17 MED ORDER — SODIUM CHLORIDE 0.9% FLUSH
10.0000 mL | INTRAVENOUS | Status: DC | PRN
  Administered 2019-08-17: 10:00:00 10 mL

## 2019-08-17 MED ORDER — SODIUM CHLORIDE 0.9 % IV SOLN: Freq: Once | INTRAVENOUS | Status: AC

## 2019-08-17 MED ORDER — TOPOTECAN HCL CHEMO INJECTION 4 MG
1.2000 mg/m2 | Freq: Once | INTRAVENOUS | Status: AC
  Administered 2019-08-17: 11:00:00 2.2 mg via INTRAVENOUS
  Filled 2019-08-17: qty 2.2

## 2019-08-17 MED ORDER — HEPARIN SOD (PORK) LOCK FLUSH 100 UNIT/ML IV SOLN
500.0000 [IU] | Freq: Once | INTRAVENOUS | Status: AC | PRN
  Administered 2019-08-17: 12:00:00 500 [IU]

## 2019-08-17 NOTE — Progress Notes (Signed)
Patient presents today for treatment Day 3 Topotecan. Vital signs within parameters for treatment. No complaints of any changes since last treatment.   Treatment given today per MD orders. Tolerated infusion without adverse affects. Vital signs stable. No complaints at this time. Discharged from clinic ambulatory. F/U with [facility] as scheduled.

## 2019-08-18 ENCOUNTER — Inpatient Hospital Stay (HOSPITAL_COMMUNITY): Payer: Medicare HMO

## 2019-08-18 VITALS — BP 121/49 | HR 50 | Temp 97.1°F | Resp 18

## 2019-08-18 DIAGNOSIS — F17210 Nicotine dependence, cigarettes, uncomplicated: Secondary | ICD-10-CM | POA: Diagnosis not present

## 2019-08-18 DIAGNOSIS — C787 Secondary malignant neoplasm of liver and intrahepatic bile duct: Secondary | ICD-10-CM | POA: Diagnosis not present

## 2019-08-18 DIAGNOSIS — Z5189 Encounter for other specified aftercare: Secondary | ICD-10-CM | POA: Diagnosis not present

## 2019-08-18 DIAGNOSIS — Z5111 Encounter for antineoplastic chemotherapy: Secondary | ICD-10-CM | POA: Diagnosis not present

## 2019-08-18 DIAGNOSIS — C3490 Malignant neoplasm of unspecified part of unspecified bronchus or lung: Secondary | ICD-10-CM | POA: Diagnosis not present

## 2019-08-18 MED ORDER — SODIUM CHLORIDE 0.9 % IV SOLN
Freq: Once | INTRAVENOUS | Status: AC
  Filled 2019-08-18: qty 2

## 2019-08-18 MED ORDER — HEPARIN SOD (PORK) LOCK FLUSH 100 UNIT/ML IV SOLN
500.0000 [IU] | Freq: Once | INTRAVENOUS | Status: AC | PRN
  Administered 2019-08-18: 500 [IU]

## 2019-08-18 MED ORDER — SODIUM CHLORIDE 0.9% FLUSH
10.0000 mL | INTRAVENOUS | Status: DC | PRN
  Administered 2019-08-18: 10 mL

## 2019-08-18 MED ORDER — TOPOTECAN HCL CHEMO INJECTION 4 MG
1.2000 mg/m2 | Freq: Once | INTRAVENOUS | Status: AC
  Administered 2019-08-18: 2.2 mg via INTRAVENOUS
  Filled 2019-08-18: qty 2.2

## 2019-08-18 MED ORDER — SODIUM CHLORIDE 0.9 % IV SOLN: Freq: Once | INTRAVENOUS | Status: AC

## 2019-08-18 NOTE — Patient Instructions (Signed)
[facility] Discharge Instructions for Patients Receiving Chemotherapy  Today you received the following chemotherapy agents   To help prevent nausea and vomiting after your treatment, we encourage you to take your nausea medication   If you develop nausea and vomiting that is not controlled by your nausea medication, call the clinic.   BELOW ARE SYMPTOMS THAT SHOULD BE REPORTED IMMEDIATELY:  *FEVER GREATER THAN 100.5 F  *CHILLS WITH OR WITHOUT FEVER  NAUSEA AND VOMITING THAT IS NOT CONTROLLED WITH YOUR NAUSEA MEDICATION  *UNUSUAL SHORTNESS OF BREATH  *UNUSUAL BRUISING OR BLEEDING  TENDERNESS IN MOUTH AND THROAT WITH OR WITHOUT PRESENCE OF ULCERS  *URINARY PROBLEMS  *BOWEL PROBLEMS  UNUSUAL RASH Items with * indicate a potential emergency and should be followed up as soon as possible.  Feel free to call the clinic should you have any questions or concerns. The clinic phone number is [phone].  Please show the CHEMO ALERT CARD at check-in to the Emergency Department and triage nurse.   

## 2019-08-18 NOTE — Progress Notes (Signed)
Patient presents today for Day 4 Topotecan. Vital signs stable. Patient has no complaints of any changes since the last treatment.   Treatment given today per MD orders. Tolerated infusion without adverse affects. Vital signs stable. No complaints at this time. Discharged from clinic ambulatory. F/U with [facility] as scheduled.

## 2019-08-19 ENCOUNTER — Inpatient Hospital Stay (HOSPITAL_COMMUNITY): Payer: Medicare HMO

## 2019-08-19 ENCOUNTER — Encounter (HOSPITAL_COMMUNITY): Payer: Self-pay

## 2019-08-19 ENCOUNTER — Other Ambulatory Visit: Payer: Self-pay

## 2019-08-19 VITALS — BP 124/54 | HR 51 | Temp 97.4°F | Resp 18

## 2019-08-19 DIAGNOSIS — C787 Secondary malignant neoplasm of liver and intrahepatic bile duct: Secondary | ICD-10-CM | POA: Diagnosis not present

## 2019-08-19 DIAGNOSIS — Z5189 Encounter for other specified aftercare: Secondary | ICD-10-CM | POA: Diagnosis not present

## 2019-08-19 DIAGNOSIS — C3490 Malignant neoplasm of unspecified part of unspecified bronchus or lung: Secondary | ICD-10-CM

## 2019-08-19 DIAGNOSIS — Z5111 Encounter for antineoplastic chemotherapy: Secondary | ICD-10-CM | POA: Diagnosis not present

## 2019-08-19 DIAGNOSIS — F17210 Nicotine dependence, cigarettes, uncomplicated: Secondary | ICD-10-CM | POA: Diagnosis not present

## 2019-08-19 MED ORDER — SODIUM CHLORIDE 0.9 % IV SOLN: Freq: Once | INTRAVENOUS | Status: AC

## 2019-08-19 MED ORDER — HEPARIN SOD (PORK) LOCK FLUSH 100 UNIT/ML IV SOLN
500.0000 [IU] | Freq: Once | INTRAVENOUS | Status: AC | PRN
  Administered 2019-08-19: 500 [IU]

## 2019-08-19 MED ORDER — SODIUM CHLORIDE 0.9% FLUSH
10.0000 mL | INTRAVENOUS | Status: DC | PRN
  Administered 2019-08-19: 10 mL

## 2019-08-19 MED ORDER — PEGFILGRASTIM 6 MG/0.6ML ~~LOC~~ PSKT
6.0000 mg | PREFILLED_SYRINGE | Freq: Once | SUBCUTANEOUS | Status: AC
  Administered 2019-08-19: 6 mg via SUBCUTANEOUS
  Filled 2019-08-19: qty 0.6

## 2019-08-19 MED ORDER — TOPOTECAN HCL CHEMO INJECTION 4 MG
1.2000 mg/m2 | Freq: Once | INTRAVENOUS | Status: AC
  Administered 2019-08-19: 2.2 mg via INTRAVENOUS
  Filled 2019-08-19: qty 2.2

## 2019-08-19 MED ORDER — SODIUM CHLORIDE 0.9 % IV SOLN
Freq: Once | INTRAVENOUS | Status: AC
  Filled 2019-08-19: qty 2

## 2019-08-19 NOTE — Progress Notes (Signed)
Treatment given per orders. Patient tolerated it well without problems. Vitals stable and discharged home from clinic ambulatory. Follow up as scheduled.  

## 2019-08-19 NOTE — Progress Notes (Signed)
Nutrition Follow-up:  Patient with extensive small cell lung cancer.  Patient receiving topotecan.    Met with patient during infusion for nutrition follow-up.  Patient reports that he is maintaining weight.  Makes smoothie with milk, protein powder, carnation instant breakfast.  Patient knows importance of getting enough protein in diet and good calories.    Patient reports issues with prostate.    Medications: reviewed  Labs: reviewed  Anthropometrics:   Weight 149 lb on 07/18/19, stable overall  Weight ranging from 144-153 lb   NUTRITION DIAGNOSIS: Malnutrition stable   MALNUTRITION DIAGNOSIS: Severe malnutrition stable   INTERVENTION:  Encouraged patient to continue eating foods high in calories and protein for weight maintenance. Encouraged patient to discuss prostate issues with MD    MONITORING, EVALUATION, GOAL: weight trends, intake   NEXT VISIT: as needed  Talor Cheema B. Zenia Resides, [place], [place] Registered Dietitian [phone] (pager)

## 2019-09-07 ENCOUNTER — Ambulatory Visit (HOSPITAL_COMMUNITY)
Admission: RE | Admit: 2019-09-07 | Discharge: 2019-09-07 | Disposition: A | Discharge status: Home / Self Care | Payer: Medicare HMO | Source: Ambulatory Visit | Attending: Hematology | Admitting: Hematology

## 2019-09-07 ENCOUNTER — Other Ambulatory Visit: Payer: Self-pay

## 2019-09-07 DIAGNOSIS — C3490 Malignant neoplasm of unspecified part of unspecified bronchus or lung: Secondary | ICD-10-CM | POA: Insufficient documentation

## 2019-09-07 DIAGNOSIS — R911 Solitary pulmonary nodule: Secondary | ICD-10-CM | POA: Diagnosis not present

## 2019-09-07 DIAGNOSIS — K5730 Diverticulosis of large intestine without perforation or abscess without bleeding: Secondary | ICD-10-CM | POA: Diagnosis not present

## 2019-09-07 DIAGNOSIS — N281 Cyst of kidney, acquired: Secondary | ICD-10-CM | POA: Diagnosis not present

## 2019-09-07 MED ORDER — GADOBUTROL 1 MMOL/ML IV SOLN
7.0000 mL | Freq: Once | INTRAVENOUS | Status: AC | PRN
  Administered 2019-09-07: 15:00:00 7 mL via INTRAVENOUS

## 2019-09-07 MED ORDER — IOHEXOL 300 MG/ML  SOLN
100.0000 mL | Freq: Once | INTRAMUSCULAR | Status: AC | PRN
  Administered 2019-09-07: 14:00:00 100 mL via INTRAVENOUS

## 2019-09-12 ENCOUNTER — Inpatient Hospital Stay (HOSPITAL_COMMUNITY): Payer: Medicare HMO | Attending: Hematology

## 2019-09-12 ENCOUNTER — Encounter (HOSPITAL_COMMUNITY): Payer: Self-pay | Admitting: Hematology

## 2019-09-12 ENCOUNTER — Inpatient Hospital Stay (HOSPITAL_BASED_OUTPATIENT_CLINIC_OR_DEPARTMENT_OTHER): Payer: Medicare HMO | Admitting: Hematology

## 2019-09-12 ENCOUNTER — Other Ambulatory Visit: Payer: Self-pay

## 2019-09-12 ENCOUNTER — Inpatient Hospital Stay (HOSPITAL_COMMUNITY): Payer: Medicare HMO

## 2019-09-12 VITALS — BP 120/50 | HR 60 | Temp 97.1°F | Resp 19 | Wt 153.8 lb

## 2019-09-12 VITALS — BP 107/45 | HR 54 | Temp 97.7°F | Resp 18

## 2019-09-12 DIAGNOSIS — C770 Secondary and unspecified malignant neoplasm of lymph nodes of head, face and neck: Secondary | ICD-10-CM | POA: Insufficient documentation

## 2019-09-12 DIAGNOSIS — C3490 Malignant neoplasm of unspecified part of unspecified bronchus or lung: Secondary | ICD-10-CM | POA: Diagnosis not present

## 2019-09-12 DIAGNOSIS — C787 Secondary malignant neoplasm of liver and intrahepatic bile duct: Secondary | ICD-10-CM | POA: Insufficient documentation

## 2019-09-12 DIAGNOSIS — D509 Iron deficiency anemia, unspecified: Secondary | ICD-10-CM | POA: Insufficient documentation

## 2019-09-12 DIAGNOSIS — Z5189 Encounter for other specified aftercare: Secondary | ICD-10-CM | POA: Insufficient documentation

## 2019-09-12 DIAGNOSIS — F17210 Nicotine dependence, cigarettes, uncomplicated: Secondary | ICD-10-CM | POA: Diagnosis not present

## 2019-09-12 DIAGNOSIS — D508 Other iron deficiency anemias: Secondary | ICD-10-CM

## 2019-09-12 DIAGNOSIS — J449 Chronic obstructive pulmonary disease, unspecified: Secondary | ICD-10-CM | POA: Diagnosis not present

## 2019-09-12 DIAGNOSIS — Z5111 Encounter for antineoplastic chemotherapy: Secondary | ICD-10-CM | POA: Diagnosis not present

## 2019-09-12 LAB — CBC WITH DIFFERENTIAL/PLATELET
Abs Immature Granulocytes: 0.02 10*3/uL (ref 0.00–0.07)
Basophils Absolute: 0 10*3/uL (ref 0.0–0.1)
Basophils Relative: 1 %
Eosinophils Absolute: 0.1 10*3/uL (ref 0.0–0.5)
Eosinophils Relative: 1 %
HCT: 25.9 % — ABNORMAL LOW (ref 39.0–52.0)
Hemoglobin: 8.2 g/dL — ABNORMAL LOW (ref 13.0–17.0)
Immature Granulocytes: 0 %
Lymphocytes Relative: 15 %
Lymphs Abs: 1.2 10*3/uL (ref 0.7–4.0)
MCH: 30.7 pg (ref 26.0–34.0)
MCHC: 31.7 g/dL (ref 30.0–36.0)
MCV: 97 fL (ref 80.0–100.0)
Monocytes Absolute: 1 10*3/uL (ref 0.1–1.0)
Monocytes Relative: 12 %
Neutro Abs: 6 10*3/uL (ref 1.7–7.7)
Neutrophils Relative %: 71 %
Platelets: 379 10*3/uL (ref 150–400)
RBC: 2.67 MIL/uL — ABNORMAL LOW (ref 4.22–5.81)
RDW: 23.9 % — ABNORMAL HIGH (ref 11.5–15.5)
WBC: 8.4 10*3/uL (ref 4.0–10.5)
nRBC: 0 % (ref 0.0–0.2)

## 2019-09-12 LAB — COMPREHENSIVE METABOLIC PANEL
ALT: 15 U/L (ref 0–44)
AST: 17 U/L (ref 15–41)
Albumin: 3.9 g/dL (ref 3.5–5.0)
Alkaline Phosphatase: 58 U/L (ref 38–126)
Anion gap: 8 (ref 5–15)
BUN: 18 mg/dL (ref 8–23)
CO2: 25 mmol/L (ref 22–32)
Calcium: 8.5 mg/dL — ABNORMAL LOW (ref 8.9–10.3)
Chloride: 104 mmol/L (ref 98–111)
Creatinine, Ser: 0.91 mg/dL (ref 0.61–1.24)
GFR calc Af Amer: >60 mL/min (ref >60)
GFR calc non Af Amer: >60 mL/min (ref >60)
Glucose, Bld: 92 mg/dL (ref 70–99)
Potassium: 4 mmol/L (ref 3.5–5.1)
Sodium: 137 mmol/L (ref 135–145)
Total Bilirubin: 0.5 mg/dL (ref 0.3–1.2)
Total Protein: 6.4 g/dL — ABNORMAL LOW (ref 6.5–8.1)

## 2019-09-12 LAB — IRON AND TIBC
Iron: 16 ug/dL — ABNORMAL LOW (ref 45–182)
Saturation Ratios: 5 % — ABNORMAL LOW (ref 17.9–39.5)
TIBC: 321 ug/dL (ref 250–450)
UIBC: 305 ug/dL

## 2019-09-12 LAB — FERRITIN: Ferritin: 103 ng/mL (ref 24–336)

## 2019-09-12 MED ORDER — SODIUM CHLORIDE 0.9 % IV SOLN
510.0000 mg | Freq: Once | INTRAVENOUS | Status: AC
  Administered 2019-09-12: 510 mg via INTRAVENOUS
  Filled 2019-09-12: qty 510

## 2019-09-12 MED ORDER — SODIUM CHLORIDE 0.9 % IV SOLN
Freq: Once | INTRAVENOUS | Status: AC
  Filled 2019-09-12: qty 2

## 2019-09-12 MED ORDER — SODIUM CHLORIDE 0.9% FLUSH
10.0000 mL | INTRAVENOUS | Status: DC | PRN
  Administered 2019-09-12: 10 mL

## 2019-09-12 MED ORDER — SODIUM CHLORIDE 0.9 % IV SOLN: Freq: Once | INTRAVENOUS | Status: AC

## 2019-09-12 MED ORDER — HEPARIN SOD (PORK) LOCK FLUSH 100 UNIT/ML IV SOLN
500.0000 [IU] | Freq: Once | INTRAVENOUS | Status: AC | PRN
  Administered 2019-09-12: 500 [IU]

## 2019-09-12 MED ORDER — TOPOTECAN HCL CHEMO INJECTION 4 MG
1.2000 mg/m2 | Freq: Once | INTRAVENOUS | Status: AC
  Administered 2019-09-12: 2.2 mg via INTRAVENOUS
  Filled 2019-09-12: qty 2.2

## 2019-09-12 MED ORDER — SODIUM CHLORIDE 0.9 % IV SOLN: Freq: Once | INTRAVENOUS | Status: DC

## 2019-09-12 NOTE — Assessment & Plan Note (Addendum)
1.  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 -Cycle 27 was on 08/15/2019.  He tolerated last cycle reasonably well. -We reviewed CT CAP from 09/07/2019 which showed stable small bilateral lung nodules, 8 mm largest in the left lung base.  No evidence of metastatic disease in abdomen or pelvis. -We reviewed MRI of the brain from 09/07/2019 which did not show any metastatic disease. -I have reviewed his CBC.  Hemoglobin is 8.2.  White count and platelets are normal.  Creatinine is also normal.  LFTs are normal. -He will proceed with his next cycle of topotecan today. -He does have right neck lymph node which has decreased in size after last cycle.  We will keep a close eye on it.  2.  Chronic pain: -He is taking Percocet 1 tablet daily.  He cannot take more than 1 tablet as it causes severe constipation.  3.  Peripheral neuropathy: -He will continue gabapentin 600 mg 3 times a day.  4.  Normocytic anemia: -This is chemotherapy-induced.  He also has relative iron deficiency.  Ferritin from October 2020 was 164 with percent saturation of 11. -We will send ferritin and iron panel today.  I have discussed with him about starting him on Feraheme infusion today.  We discussed side effects.  He cannot tolerate oral iron therapy because of constipation.

## 2019-09-12 NOTE — Progress Notes (Signed)
Patient has been assessed, vital signs and labs have been reviewed by Dr. Delton Coombes. ANC, Creatinine, LFTs, and Platelets are within treatment parameters per Dr. Delton Coombes. The patient is good to proceed with treatment at this time. Please give Feraheme today as well per Dr. Delton Coombes.

## 2019-09-12 NOTE — Patient Instructions (Signed)

## 2019-09-12 NOTE — Patient Instructions (Addendum)
[place] at [facility] Discharge Instructions  You were seen today by Dr. Delton Coombes. He went over your recent lab results. He will see you back in 4 weeks for labs, treatment and follow up.   Thank you for choosing [place] at [facility] to provide your oncology and hematology care.  To afford each patient quality time with our provider, please arrive at least 15 minutes before your scheduled appointment time.   If you have a lab appointment with the [facility] please come in thru the  Main Entrance and check in at the main information desk  You need to re-schedule your appointment should you arrive 10 or more minutes late.  We strive to give you quality time with our providers, and arriving late affects you and other patients whose appointments are after yours.  Also, if you no show three or more times for appointments you may be dismissed from the clinic at the providers discretion.     Again, thank you for choosing [facility].  Our hope is that these requests will decrease the amount of time that you wait before being seen by our physicians.       _____________________________________________________________  Should you have questions after your visit to [facility], please contact our office at [phone] between the hours of 8:00 a.m. and 4:30 p.m.  Voicemails left after 4:00 p.m. will not be returned until the following business day.  For prescription refill requests, have your pharmacy contact our office and allow 72 hours.    Cancer Center Support Programs:   > Cancer Support Group  2nd Tuesday of the month 1pm-2pm, Journey Room

## 2019-09-12 NOTE — Progress Notes (Signed)
Patient presents today for treatment and follow up visit with Dr. Delton Coombes. Labs within parameters for treatment today. Vital signs within parameters for treatment. HGB today 8.2. Calcium 8.5.   Message received from [facility] LPN/ Dr. Delton Coombes to proceed with treatment. Infuse Feraheme today.   Treatment given today per MD orders. Patient received Feraheme today with treatment. Tolerated infusion without adverse affects. Vital signs stable. No complaints at this time. Discharged from clinic ambulatory. F/U with [facility] as scheduled.

## 2019-09-12 NOTE — Progress Notes (Signed)
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 Bulky 5.4 cm right supraclavicular region malignant lymph node conglomeration with extracapsular extension. 2. Surrounding smaller abnormal right level 3 and level 5 lymph nodes, and the lymphadenopathy continues into the superior mediastinum, see Chest CT findings reported separately. 3. No other metastatic disease identified in the neck.   01/16/2017 Imaging   CT chest: IMPRESSION: 1. Extensive lymphadenopathy in the thorax and lower right cervical region, as discussed above. Primary differential considerations include lymphoma/leukemia or small cell carcinoma of the lung. Further evaluation a PET-CT could be considered to assess for additional sites of disease below the diaphragm if clinically appropriate. Additionally, ultrasound-guided biopsy of supraclavicular lymphadenopathy could be considered to establish a tissue diagnosis. 2. Indeterminate lesion in the periphery of segment 8 of the liver measuring 2.7 x 1.7 cm. Attention at time of follow-up PET-CT is recommended. 3. Aortic atherosclerosis, in addition to left main and 3 vessel coronary artery disease. Please note that although the presence of coronary artery calcium documents the presence of coronary artery disease, the severity of this disease and any potential stenosis cannot be assessed on this non-gated CT examination. Assessment for potential risk factor modification, dietary therapy or pharmacologic therapy may be warranted, if clinically indicated. 4. There are calcifications of the aortic valve.  Echocardiographic correlation for evaluation of potential valvular dysfunction may be warranted if clinically indicated. 5. Diffuse bronchial wall thickening with moderate centrilobular and paraseptal emphysema; imaging findings suggestive of underlying COPD.   02/03/2017 Initial Biopsy   (R) neck lymph node biopsy: SMALL CELL CARCINOMA (most likely lung primary).    02/03/2017 Miscellaneous   Port-a-cath attempted by IR; unable to place d/t enlarged SVC.    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 Staging for metastatic disease  EXAM: MRI HEAD WITHOUT AND WITH CONTRAST  TECHNIQUE: Multiplanar, multiecho pulse sequences of the brain and surrounding structures were obtained without and with intravenous contrast.  CONTRAST:  57m MULTIHANCE GADOBENATE DIMEGLUMINE 529 MG/ML IV SOLN  COMPARISON:  None.  FINDINGS: Brain: Negative for hydrocephalus. Cerebral volume normal for age. Small nonenhancing white matter hyperintensities consistent with mild chronic microvascular ischemia. No acute infarct. Negative for hemorrhage or mass or edema  Normal enhancement postcontrast infusion. No enhancing mass lesion. Leptomeningeal enhancement is normal.  Vascular: Normal arterial flow voids.  Normal venous enhancement  Skull and upper cervical spine: Negative  Sinuses/Orbits: Negative  Other: None  IMPRESSION: Negative for metastatic disease.  No acute abnormality.  Mild chronic white matter changes.   04/07/2017 Imaging    PET:  1. Marked reduction in size and metabolic activity of bulky RIGHT supraclavicular adenopathy mediastinal lymphadenopathy. 2. Residual moderate activity remains within small RIGHT supraclavicular lymph node, RIGHT lower paratracheal  lymph node and RIGHT hilar lymph node. 3. Resolution of prevascular and internal mammary mediastinal metastatic hypermetabolic activity. 4. Resolution of metabolic activity associated with solitary RIGHT  hepatic lobe liver metastasis. 5. No evidence of disease progression. 6. 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   05/27/2017 Imaging   MRI brain w/ and w/o contrast IMPRESSION: 1. No metastatic disease identified. 2. Increased nonspecific cerebral white matter signal changes since August. These are most commonly small vessel disease related. 3. New right maxillary sinusitis. Benign appearing retention cysts in the nasopharynx with trace mastoid effusions.   06/12/2017 Imaging   PET-CT IMPRESSION: 1. There are two new hypermetabolic nodules identified within both lower lobes measuring up to 3.1 cm. The appearance is nonspecific and may be inflammatory/infectious in etiology. Pulmonary metastatic disease cannot be excluded and short-term follow-up imaging in 3 months is advised to reassess these nodules. 2. Stable appearance of mild hypermetabolic activity associated with right paratracheal and right hilar lymph nodes. 3. Decrease in FDG uptake associated with index right supraclavicular lymph node. 4. 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 5. Aortic Atherosclerosis (ICD10-I70.0) and Emphysema (ICD10-J43.9).   08/24/2017 -  Chemotherapy   The patient had pegfilgrastim (NEULASTA ONPRO KIT) injection 6 mg, 6 mg, Subcutaneous, Once, 28 of 28 cycles Administration: 6 mg (08/28/2017), 6 mg (09/18/2017), 6 mg (10/09/2017), 6 mg (11/02/2017), 6 mg (11/20/2017), 6 mg (12/18/2017), 6 mg (01/15/2018), 6 mg (02/12/2018), 6 mg (03/12/2018), 6 mg (04/09/2018), 6 mg (05/07/2018), 6 mg (06/04/2018), 6 mg  (07/02/2018), 6 mg (07/30/2018), 6 mg (08/27/2018), 6 mg (09/24/2018), 6 mg (10/29/2018), 6 mg (11/26/2018), 6 mg (12/24/2018), 6 mg (01/21/2019), 6 mg (02/18/2019), 6 mg (03/18/2019), 6 mg (04/15/2019), 6 mg (05/13/2019), 6 mg (06/17/2019), 6 mg (07/22/2019), 6 mg (08/19/2019) topotecan (HYCAMTIN) 2.9 mg in sodium chloride 0.9 % 100 mL chemo infusion, 1.5 mg/m2 = 2.9 mg, Intravenous,  Once, 28 of 28 cycles Dose modification: 1.2 mg/m2 (80 % of original dose 1.5 mg/m2, Cycle 4, Reason: Dose Not Tolerated) Administration: 2.9 mg (08/24/2017), 2.9 mg (08/25/2017), 2.9 mg (08/28/2017), 2.9 mg (08/26/2017), 2.9 mg (08/27/2017), 2.9 mg (09/14/2017), 2.9 mg (09/15/2017), 2.9 mg (09/16/2017), 2.9 mg (09/17/2017), 2.9 mg (09/18/2017), 2.9 mg (10/05/2017), 2.9 mg (10/06/2017), 2.9 mg (10/07/2017), 2.9 mg (10/08/2017), 2.9 mg (10/09/2017), 2.3 mg (10/26/2017), 2.3 mg (10/27/2017), 2.3 mg (10/28/2017), 2.3 mg (10/29/2017), 2.3 mg (11/02/2017), 2.3 mg (11/16/2017), 2.3 mg (11/17/2017), 2.3 mg (11/18/2017), 2.3 mg (11/19/2017), 2.3 mg (11/20/2017), 2.3 mg (12/14/2017), 2.3 mg (12/15/2017), 2.3 mg (12/16/2017), 2.3 mg (12/17/2017), 2.3 mg (12/18/2017), 2.3 mg (01/11/2018), 2.3 mg (01/12/2018), 2.3 mg (01/13/2018), 2.3 mg (01/15/2018), 2.3 mg (02/08/2018), 2.3 mg (02/09/2018), 2.3 mg (02/10/2018), 2.3 mg (02/11/2018), 2.3 mg (02/12/2018), 2.3 mg (03/08/2018), 2.3 mg (03/09/2018), 2.3 mg (03/10/2018), 2.3 mg (03/11/2018), 2.3 mg (03/12/2018), 2.2 mg (04/05/2018), 2.2 mg (04/06/2018), 2.2 mg (04/07/2018), 2.2 mg (04/08/2018), 2.2 mg (04/09/2018), 2.2 mg (05/03/2018), 2.2 mg (05/04/2018), 2.2 mg (05/05/2018), 2.2 mg (05/06/2018), 2.2 mg (05/07/2018), 2.2 mg (05/31/2018), 2.2 mg (06/01/2018), 2.2 mg (06/02/2018), 2.2 mg (06/03/2018), 2.2 mg (06/04/2018), 2.2 mg (06/28/2018), 2.2 mg (06/29/2018), 2.2 mg (06/30/2018), 2.2 mg (07/01/2018), 2.2 mg (07/02/2018), 2.2 mg (07/26/2018), 2.2 mg (07/27/2018), 2.2 mg (07/28/2018), 2.2 mg (07/29/2018), 2.2 mg (07/30/2018), 2.2 mg (08/23/2018), 2.2 mg (08/24/2018), 2.2 mg (08/25/2018),  2.2 mg (08/26/2018), 2.2 mg (08/27/2018), 2.2 mg (09/20/2018), 2.2 mg (09/21/2018), 2.2 mg (09/22/2018), 2.2 mg (09/23/2018), 2.2 mg (09/24/2018), 2.2 mg (10/25/2018), 2.2 mg (10/26/2018), 2.2 mg (10/27/2018), 2.2 mg (10/28/2018), 2.2 mg (10/29/2018), 2.2 mg (11/22/2018),  2.2 mg (11/23/2018), 2.2 mg (11/24/2018), 2.2 mg (11/25/2018), 2.2 mg (11/26/2018), 2.2 mg (12/20/2018), 2.2 mg (12/21/2018), 2.2 mg (12/22/2018), 2.2 mg (12/23/2018), 2.2 mg (12/24/2018), 2.2 mg (01/17/2019), 2.2 mg (01/18/2019), 2.2 mg (01/19/2019), 2.2 mg (01/20/2019), 2.2 mg (01/21/2019), 2.2 mg (02/14/2019), 2.2 mg (02/15/2019), 2.2 mg (02/16/2019), 2.2 mg (02/17/2019), 2.2 mg (02/18/2019), 2.2 mg (03/14/2019), 2.2 mg (03/15/2019), 2.2 mg (03/16/2019), 2.2 mg (03/17/2019), 2.2 mg (03/18/2019), 2.2 mg (04/11/2019), 2.2 mg (04/12/2019), 2.2 mg (04/13/2019), 2.2 mg (04/14/2019), 2.2 mg (04/15/2019), 2.2 mg (05/09/2019), 2.2 mg (05/10/2019), 2.2 mg (05/11/2019), 2.2 mg (05/12/2019), 2.2 mg (05/13/2019), 2.2 mg (06/13/2019), 2.2 mg (06/14/2019), 2.2 mg (06/15/2019), 2.2 mg (06/16/2019), 2.2 mg (06/17/2019), 2.2 mg (07/18/2019), 2.2 mg (07/19/2019), 2.2 mg (07/20/2019), 2.2 mg (07/21/2019), 2.2 mg (07/22/2019), 2.2 mg (08/15/2019), 2.2 mg (08/16/2019), 2.2 mg (08/17/2019), 2.2 mg (08/18/2019), 2.2 mg (08/19/2019) ondansetron (ZOFRAN) 4 mg in sodium chloride 0.9 % 50 mL IVPB, , Intravenous,  Once, 23 of 23 cycles Administration:  (02/08/2018),  (02/09/2018),  (02/10/2018),  (02/11/2018),  (02/12/2018),  (03/08/2018),  (03/09/2018),  (03/10/2018),  (03/11/2018),  (03/12/2018),  (04/05/2018),  (04/06/2018),  (04/07/2018),  (04/08/2018),  (04/09/2018),  (05/03/2018),  (05/04/2018),  (05/05/2018),  (05/06/2018),  (05/07/2018),  (05/31/2018),  (06/01/2018),  (06/02/2018),  (06/03/2018),  (06/04/2018),  (06/28/2018),  (06/29/2018),  (06/30/2018),  (07/01/2018),  (07/02/2018),  (07/26/2018),  (07/27/2018),  (07/28/2018),  (07/29/2018),  (07/30/2018),  (08/23/2018),  (08/24/2018),  (08/25/2018),  (08/26/2018),  (08/27/2018),  (09/20/2018),  (09/21/2018),   (09/22/2018),  (09/23/2018),  (09/24/2018),  (10/25/2018),  (10/26/2018),  (10/27/2018),  (10/28/2018),  (10/29/2018),  (11/22/2018),  (11/23/2018),  (11/24/2018),  (11/25/2018),  (11/26/2018),  (12/20/2018),  (12/21/2018),  (12/22/2018),  (12/23/2018),  (12/24/2018),  (01/17/2019),  (01/18/2019),  (01/19/2019),  (01/20/2019),  (01/21/2019),  (02/14/2019),  (02/15/2019),  (02/16/2019),  (02/17/2019),  (02/18/2019),  (03/14/2019),  (03/15/2019),  (03/16/2019),  (03/17/2019),  (03/18/2019),  (04/11/2019),  (04/12/2019),  (04/13/2019),  (04/14/2019),  (04/15/2019),  (05/09/2019),  (05/10/2019),  (05/11/2019),  (05/12/2019),  (05/13/2019),  (06/13/2019),  (06/14/2019),  (06/15/2019),  (06/16/2019),  (06/17/2019),  (07/18/2019),  (07/19/2019),  (07/20/2019),  (07/21/2019),  (07/22/2019),  (08/15/2019),  (08/16/2019),  (08/17/2019),  (08/18/2019),  (08/19/2019)  for chemotherapy treatment.         INTERVAL HISTORY:  Mr. Stallbaumer 69 y.o. male seen for follow-up of extensive stage small cell lung cancer.  He has noticed right neck lymph node which has decreased in size after last cycle.  Denies any bleeding per rectum or melena.  He had CT and MRI done from last visit.  Appetite is 25% and energy levels are 75%.  Chronic cough is stable.  Numbness in the feet has been stable.  He is taking 1 tablet of Percocet daily for pain.  REVIEW OF SYSTEMS:  Review of Systems  Respiratory: Positive for cough.   Gastrointestinal: Positive for constipation.  Neurological: Positive for numbness.  All other systems reviewed and are negative.    PAST MEDICAL/SURGICAL HISTORY:  Past Medical History:  Diagnosis Date  . Anxiety   . CAD (coronary artery disease)   . Cancer ([place])    stage 4 small cell lung cancer   . COPD (chronic obstructive pulmonary disease) ([place])   . Depression   . Dyspnea    increased exertion  . Feeling of chest tightness   . Heart palpitations   . History of chemotherapy   . Myocardial infarction ([place])   . Osteopenia   . Panic attacks   . Smoker    Past  Surgical History:  Procedure Laterality Date  . BACK SURGERY  12/24/2000   L5,S1  . CORONARY STENT PLACEMENT  2005   RCA & CX  . HERNIA REPAIR Right 1980's  . INGUINAL HERNIA REPAIR  12/1978   right side  . IR FLUORO GUIDE PORT INSERTION RIGHT  04/02/2017  . IR US GUIDE BX ASP/DRAIN  02/03/2017  . IR US GUIDE VASC ACCESS RIGHT  04/02/2017  . NM MYOCAR PERF WALL MOTION  09/07/2009   No ischemia; EF 51%  . SHOULDER SURGERY Left 08/2010  . SPINE SURGERY  2002   L5-S1     SOCIAL HISTORY:  Social History   Socioeconomic History  . Marital status: Single    Spouse name: Not on file  . Number of children: Not on file  . Years of education: Not on file  . Highest education level: Not on file  Occupational History  . Not on file  Tobacco Use  . Smoking status: Current Every Day Smoker    Packs/day: 1.00    Types: Cigarettes  . Smokeless tobacco: Never Used  Substance and Sexual Activity  . Alcohol use: Yes    Comment: occas  . Drug use: No  . Sexual activity: Not on file  Other Topics Concern  . Not on file  Social History Narrative  . Not on file   Social Determinants of Health   Financial Resource Strain:   . Difficulty of Paying Living Expenses: Not on file  Food Insecurity:   . Worried About Charity fundraiser in the Last Year: Not on file  . Ran Out of Food in the Last Year: Not on file  Transportation Needs:   . Lack of Transportation (Medical): Not on file  . Lack of Transportation (Non-Medical): Not on file  Physical Activity:   . Days of Exercise per Week: Not on file  . Minutes of Exercise per Session: Not on file  Stress:   . Feeling of Stress : Not on file  Social Connections:   . Frequency of Communication with Friends and Family: Not on file  . Frequency of Social Gatherings with Friends and Family: Not on file  . Attends Religious Services: Not on file  . Active Member of Clubs or Organizations: Not on file  . Attends Archivist Meetings:  Not on file  . Marital Status: Not on file  Intimate Partner Violence:   . Fear of Current or Ex-Partner: Not on file  . Emotionally Abused: Not on file  . Physically Abused: Not on file  . Sexually Abused: Not on file    FAMILY HISTORY:  Family History  Problem Relation Age of Onset  . Heart attack Father   . Kidney disease Father        renal failure  . Heart failure Mother   . Heart attack Mother   . Cancer Brother   . Diabetes Brother   . Alcohol abuse Brother   . Diabetes Sister     CURRENT MEDICATIONS:  Outpatient Encounter Medications as of 09/12/2019  Medication Sig  . ALPRAZolam (XANAX) 1 MG tablet Take 1 tablet (1 mg total) by mouth 4 (four) times daily as needed for anxiety.  . Ascorbic Acid (VITAMIN C) 1000 MG tablet Take 1,000 mg by mouth daily.  Marland Kitchen aspirin EC 81 MG tablet Take 81 mg by mouth daily.  Marland Kitchen atorvastatin (LIPITOR) 40 MG tablet TAKE (1) TABLET BY MOUTH ONCE DAILY.  Marland Kitchen Bioflavonoid Products (ESTER C PO) Take 1  tablet by mouth daily.  . calcium-vitamin D (OSCAL WITH D) 250-125 MG-UNIT tablet Take 1 tablet by mouth daily.  . cholecalciferol (VITAMIN D3) 25 MCG (1000 UT) tablet Take 1,000 Units by mouth daily.  Marland Kitchen gabapentin (NEURONTIN) 300 MG capsule Take 2 capsules (600 mg total) by mouth 3 (three) times daily.  . hydrOXYzine (ATARAX/VISTARIL) 50 MG tablet TAKE ONE TABLET BY MOUTH AT BEDTIME AS NEEDED FOR SLEEP.  . Melatonin 10 MG TABS Take 1 capsule by mouth every evening.  . Multiple Vitamins-Minerals (CENTRUM SILVER 50+MEN) TABS Take 1 tablet by mouth daily.  Marland Kitchen oxyCODONE-acetaminophen (PERCOCET/ROXICET) 5-325 MG tablet TAKE 1 TABLET EVERY 8 HOURS AS NEEDED FOR SEVERE PAIN.  Marland Kitchen Pegfilgrastim (NEULASTA ONPRO [place]) Inject into the skin. Every 21 days  . senna (SENOKOT) 8.6 MG tablet Take 1 tablet by mouth daily.  . topotecan in sodium chloride 0.9 % 100 mL Inject into the vein once. Days 1-5 every 21 days  . vitamin B-12 (CYANOCOBALAMIN) 100 MCG tablet Take 100  mcg by mouth daily.  Marland Kitchen albuterol (VENTOLIN HFA) 108 (90 Base) MCG/ACT inhaler Inhale 2 puffs into the lungs every 6 (six) hours as needed for wheezing or shortness of breath.  . clotrimazole-betamethasone (LOTRISONE) cream Apply 1 application topically 2 (two) times daily. (Patient not taking: Reported on 09/12/2019)  . ondansetron (ZOFRAN) 8 MG tablet Take 1 tablet (8 mg total) by mouth 2 (two) times daily as needed. Start on the third day after cisplatin chemotherapy. (Patient not taking: Reported on 05/09/2019)  . polyethylene glycol powder (GLYCOLAX/MIRALAX) powder Take 17 g by mouth daily. (Patient not taking: Reported on 09/12/2019)  . prochlorperazine (COMPAZINE) 10 MG tablet Take 1 tablet (10 mg total) by mouth every 6 (six) hours as needed (Nausea or vomiting). (Patient not taking: Reported on 05/09/2019)  . [DISCONTINUED] BESIVANCE 0.6 % SUSP Pt has not started yet  . [DISCONTINUED] LOTEMAX 0.5 % ophthalmic suspension Place 1 drop into the left eye 4 (four) times daily. Pt has not started yet  . [DISCONTINUED] PROLENSA 0.07 % SOLN Place 1 drop into the left eye daily. Pt has not started yet   No facility-administered encounter medications on file as of 09/12/2019.    ALLERGIES:  Allergies  Allergen Reactions  . Codeine Nausea Only  . Niaspan [Niacin Er]      PHYSICAL EXAM:  ECOG Performance status: 1  Vitals:   09/12/19 0935  BP: (!) 120/50  Pulse: 60  Resp: 19  Temp: (!) 97.1 F (36.2 C)  SpO2: 97%   Filed Weights   09/12/19 0935  Weight: 153 lb 12.8 oz (69.8 kg)    Physical Exam Vitals reviewed.  Constitutional:      Appearance: Normal appearance.  Cardiovascular:     Rate and Rhythm: Normal rate and regular rhythm.     Heart sounds: Normal heart sounds.  Pulmonary:     Effort: Pulmonary effort is normal.     Breath sounds: Normal breath sounds.  Abdominal:     General: There is no distension.     Palpations: Abdomen is soft. There is no mass.    Musculoskeletal:        General: No swelling.  Skin:    General: Skin is warm.  Neurological:     General: No focal deficit present.     Mental Status: He is alert and oriented to person, place, and time.  Psychiatric:        Mood and Affect: Mood normal.  Behavior: Behavior normal.      LABORATORY DATA:  I have reviewed the labs as listed.  CBC    Component Value Date/Time   WBC 8.4 09/12/2019 0936   RBC 2.67 (L) 09/12/2019 0936   HGB 8.2 (L) 09/12/2019 0936   HCT 25.9 (L) 09/12/2019 0936   PLT 379 09/12/2019 0936   MCV 97.0 09/12/2019 0936   MCH 30.7 09/12/2019 0936   MCHC 31.7 09/12/2019 0936   RDW 23.9 (H) 09/12/2019 0936   LYMPHSABS 1.2 09/12/2019 0936   MONOABS 1.0 09/12/2019 0936   EOSABS 0.1 09/12/2019 0936   BASOSABS 0.0 09/12/2019 0936   CMP Latest Ref Rng & Units 09/12/2019 08/15/2019 07/18/2019  Glucose 70 - 99 mg/dL 92 108(H) 95  BUN 8 - 23 mg/dL _0 Creatinine 0.61 - 1.24 mg/dL 0.91 0.88 0.77  Sodium 135 - 145 mmol/L 137 138 136  Potassium 3.5 - 5.1 mmol/L 4.0 4.0 4.0  Chloride 98 - 111 mmol/L 104 105 106  CO2 22 - 32 mmol/L _1 Calcium 8.9 - 10.3 mg/dL 8.5(L) 9.2 8.6(L)  Total Protein 6.5 - 8.1 g/dL 6.4(L) 6.6 6.4(L)  Total Bilirubin 0.3 - 1.2 mg/dL 0.5 0.6 0.4  Alkaline Phos 38 - 126 U/L 58 61 50  AST 15 - 41 U/L _2 ALT 0 - 44 U/L _3 I have independently reviewed the scans and discussed with the patient.   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 -Cycle 27 was on 08/15/2019.  He tolerated last cycle reasonably well. -We reviewed CT CAP from 09/07/2019 which showed stable small bilateral lung nodules, 8 mm largest in the left lung base.  No evidence of metastatic disease in  abdomen or pelvis. -We reviewed MRI of the brain from 09/07/2019 which did not show any metastatic disease. -I have reviewed his CBC.  Hemoglobin is 8.2.  White count and platelets are normal.  Creatinine is also normal.  LFTs are normal. -He will proceed with his next cycle of topotecan today. -He does have right neck lymph node which has decreased in size after last cycle.  We will keep a close eye on it.  2.  Chronic pain: -He is taking Percocet 1 tablet daily.  He cannot take more than 1 tablet as it causes severe constipation.  3.  Peripheral neuropathy: -He will continue gabapentin 600 mg 3 times a day.  4.  Normocytic anemia: -This is chemotherapy-induced.  He also has relative iron deficiency.  Ferritin from October 2020 was 164 with percent saturation of 11. -We will send ferritin and iron panel today.  I have discussed with him about starting him on Feraheme infusion today.  We discussed side effects.  He cannot tolerate oral iron therapy because of constipation.     Orders placed this encounter:  Orders Placed This Encounter  Procedures  . CBC with Differential/Platelet  . Comprehensive metabolic panel  . Magnesium  . Ferritin  . Iron and TIBC      Derek Jack, MD [place] [phone]

## 2019-09-13 ENCOUNTER — Inpatient Hospital Stay (HOSPITAL_COMMUNITY): Payer: Medicare HMO

## 2019-09-13 VITALS — BP 120/50 | HR 53 | Temp 97.9°F | Resp 18

## 2019-09-13 DIAGNOSIS — C3490 Malignant neoplasm of unspecified part of unspecified bronchus or lung: Secondary | ICD-10-CM | POA: Diagnosis not present

## 2019-09-13 DIAGNOSIS — F17210 Nicotine dependence, cigarettes, uncomplicated: Secondary | ICD-10-CM | POA: Diagnosis not present

## 2019-09-13 DIAGNOSIS — Z5189 Encounter for other specified aftercare: Secondary | ICD-10-CM | POA: Diagnosis not present

## 2019-09-13 DIAGNOSIS — C770 Secondary and unspecified malignant neoplasm of lymph nodes of head, face and neck: Secondary | ICD-10-CM | POA: Diagnosis not present

## 2019-09-13 DIAGNOSIS — Z5111 Encounter for antineoplastic chemotherapy: Secondary | ICD-10-CM | POA: Diagnosis not present

## 2019-09-13 DIAGNOSIS — C787 Secondary malignant neoplasm of liver and intrahepatic bile duct: Secondary | ICD-10-CM | POA: Diagnosis not present

## 2019-09-13 MED ORDER — TOPOTECAN HCL CHEMO INJECTION 4 MG
1.2000 mg/m2 | Freq: Once | INTRAVENOUS | Status: AC
  Administered 2019-09-13: 2.2 mg via INTRAVENOUS
  Filled 2019-09-13: qty 2.2

## 2019-09-13 MED ORDER — HEPARIN SOD (PORK) LOCK FLUSH 100 UNIT/ML IV SOLN
500.0000 [IU] | Freq: Once | INTRAVENOUS | Status: AC | PRN
  Administered 2019-09-13: 500 [IU]

## 2019-09-13 MED ORDER — SODIUM CHLORIDE 0.9% FLUSH
10.0000 mL | INTRAVENOUS | Status: DC | PRN
  Administered 2019-09-13 (×2): 10 mL

## 2019-09-13 MED ORDER — SODIUM CHLORIDE 0.9 % IV SOLN: Freq: Once | INTRAVENOUS | Status: AC

## 2019-09-13 MED ORDER — SODIUM CHLORIDE 0.9 % IV SOLN
Freq: Once | INTRAVENOUS | Status: AC
  Filled 2019-09-13: qty 2

## 2019-09-13 NOTE — Progress Notes (Signed)
Patient presents today for Day 2 Topotecan. Vital signs are stable. Patient has no complaints of any changes from yesterday's visit. MAR reviewed.   Treatment given today per MD orders. Tolerated infusion without adverse affects. Vital signs stable. No complaints at this time. Discharged from clinic ambulatory. F/U with [facility] as scheduled.

## 2019-09-13 NOTE — Patient Instructions (Signed)
[facility] Discharge Instructions for Patients Receiving Chemotherapy  Today you received the following chemotherapy agents   To help prevent nausea and vomiting after your treatment, we encourage you to take your nausea medication   If you develop nausea and vomiting that is not controlled by your nausea medication, call the clinic.   BELOW ARE SYMPTOMS THAT SHOULD BE REPORTED IMMEDIATELY:  *FEVER GREATER THAN 100.5 F  *CHILLS WITH OR WITHOUT FEVER  NAUSEA AND VOMITING THAT IS NOT CONTROLLED WITH YOUR NAUSEA MEDICATION  *UNUSUAL SHORTNESS OF BREATH  *UNUSUAL BRUISING OR BLEEDING  TENDERNESS IN MOUTH AND THROAT WITH OR WITHOUT PRESENCE OF ULCERS  *URINARY PROBLEMS  *BOWEL PROBLEMS  UNUSUAL RASH Items with * indicate a potential emergency and should be followed up as soon as possible.  Feel free to call the clinic should you have any questions or concerns. The clinic phone number is [phone].  Please show the CHEMO ALERT CARD at check-in to the Emergency Department and triage nurse.   

## 2019-09-14 ENCOUNTER — Other Ambulatory Visit: Payer: Self-pay

## 2019-09-14 ENCOUNTER — Inpatient Hospital Stay (HOSPITAL_COMMUNITY): Payer: Medicare HMO

## 2019-09-14 ENCOUNTER — Ambulatory Visit (HOSPITAL_COMMUNITY): Payer: Medicare HMO

## 2019-09-14 ENCOUNTER — Other Ambulatory Visit: Payer: Self-pay | Admitting: Family Medicine

## 2019-09-14 VITALS — BP 110/45 | HR 52 | Temp 98.1°F | Resp 18

## 2019-09-14 DIAGNOSIS — C3490 Malignant neoplasm of unspecified part of unspecified bronchus or lung: Secondary | ICD-10-CM

## 2019-09-14 DIAGNOSIS — F5101 Primary insomnia: Secondary | ICD-10-CM

## 2019-09-14 DIAGNOSIS — C787 Secondary malignant neoplasm of liver and intrahepatic bile duct: Secondary | ICD-10-CM | POA: Diagnosis not present

## 2019-09-14 DIAGNOSIS — C770 Secondary and unspecified malignant neoplasm of lymph nodes of head, face and neck: Secondary | ICD-10-CM | POA: Diagnosis not present

## 2019-09-14 DIAGNOSIS — F17210 Nicotine dependence, cigarettes, uncomplicated: Secondary | ICD-10-CM | POA: Diagnosis not present

## 2019-09-14 DIAGNOSIS — Z5111 Encounter for antineoplastic chemotherapy: Secondary | ICD-10-CM | POA: Diagnosis not present

## 2019-09-14 DIAGNOSIS — F411 Generalized anxiety disorder: Secondary | ICD-10-CM

## 2019-09-14 DIAGNOSIS — Z5189 Encounter for other specified aftercare: Secondary | ICD-10-CM | POA: Diagnosis not present

## 2019-09-14 MED ORDER — HEPARIN SOD (PORK) LOCK FLUSH 100 UNIT/ML IV SOLN
500.0000 [IU] | Freq: Once | INTRAVENOUS | Status: AC | PRN
  Administered 2019-09-14: 500 [IU]

## 2019-09-14 MED ORDER — SODIUM CHLORIDE 0.9 % IV SOLN
Freq: Once | INTRAVENOUS | Status: AC
  Filled 2019-09-14: qty 2

## 2019-09-14 MED ORDER — TOPOTECAN HCL CHEMO INJECTION 4 MG
1.2000 mg/m2 | Freq: Once | INTRAVENOUS | Status: AC
  Administered 2019-09-14: 2.2 mg via INTRAVENOUS
  Filled 2019-09-14: qty 2.2

## 2019-09-14 MED ORDER — SODIUM CHLORIDE 0.9% FLUSH
10.0000 mL | INTRAVENOUS | Status: DC | PRN
  Administered 2019-09-14 (×2): 10 mL

## 2019-09-14 MED ORDER — SODIUM CHLORIDE 0.9 % IV SOLN: Freq: Once | INTRAVENOUS | Status: AC

## 2019-09-14 NOTE — Telephone Encounter (Signed)
Ok to refill??  Last office visit 12/28/2018.  Last refill 06/23/2019, #2 refills.

## 2019-09-14 NOTE — Patient Instructions (Signed)
[facility] Discharge Instructions for Patients Receiving Chemotherapy  Today you received the following chemotherapy agents   To help prevent nausea and vomiting after your treatment, we encourage you to take your nausea medication   If you develop nausea and vomiting that is not controlled by your nausea medication, call the clinic.   BELOW ARE SYMPTOMS THAT SHOULD BE REPORTED IMMEDIATELY:  *FEVER GREATER THAN 100.5 F  *CHILLS WITH OR WITHOUT FEVER  NAUSEA AND VOMITING THAT IS NOT CONTROLLED WITH YOUR NAUSEA MEDICATION  *UNUSUAL SHORTNESS OF BREATH  *UNUSUAL BRUISING OR BLEEDING  TENDERNESS IN MOUTH AND THROAT WITH OR WITHOUT PRESENCE OF ULCERS  *URINARY PROBLEMS  *BOWEL PROBLEMS  UNUSUAL RASH Items with * indicate a potential emergency and should be followed up as soon as possible.  Feel free to call the clinic should you have any questions or concerns. The clinic phone number is [phone].  Please show the CHEMO ALERT CARD at check-in to the Emergency Department and triage nurse.   

## 2019-09-14 NOTE — Progress Notes (Signed)
Patient presents today for Topotecan Day 3. Vital signs are stable. Patient has no complaints of any changes since his last visit.   Treatment given today per MD orders. Tolerated infusion without adverse affects. Vital signs stable. No complaints at this time. Discharged from clinic ambulatory. F/U with [facility] as scheduled.

## 2019-09-15 ENCOUNTER — Other Ambulatory Visit: Payer: Self-pay

## 2019-09-15 ENCOUNTER — Inpatient Hospital Stay (HOSPITAL_COMMUNITY): Payer: Medicare HMO

## 2019-09-15 ENCOUNTER — Encounter (HOSPITAL_COMMUNITY): Payer: Self-pay

## 2019-09-15 VITALS — BP 124/48 | HR 51 | Temp 98.1°F | Resp 18

## 2019-09-15 DIAGNOSIS — C787 Secondary malignant neoplasm of liver and intrahepatic bile duct: Secondary | ICD-10-CM | POA: Diagnosis not present

## 2019-09-15 DIAGNOSIS — C770 Secondary and unspecified malignant neoplasm of lymph nodes of head, face and neck: Secondary | ICD-10-CM | POA: Diagnosis not present

## 2019-09-15 DIAGNOSIS — F17210 Nicotine dependence, cigarettes, uncomplicated: Secondary | ICD-10-CM | POA: Diagnosis not present

## 2019-09-15 DIAGNOSIS — C3490 Malignant neoplasm of unspecified part of unspecified bronchus or lung: Secondary | ICD-10-CM | POA: Diagnosis not present

## 2019-09-15 DIAGNOSIS — Z5111 Encounter for antineoplastic chemotherapy: Secondary | ICD-10-CM | POA: Diagnosis not present

## 2019-09-15 DIAGNOSIS — Z5189 Encounter for other specified aftercare: Secondary | ICD-10-CM | POA: Diagnosis not present

## 2019-09-15 MED ORDER — HEPARIN SOD (PORK) LOCK FLUSH 100 UNIT/ML IV SOLN
500.0000 [IU] | Freq: Once | INTRAVENOUS | Status: AC | PRN
  Administered 2019-09-15: 500 [IU]

## 2019-09-15 MED ORDER — SODIUM CHLORIDE 0.9% FLUSH
10.0000 mL | INTRAVENOUS | Status: DC | PRN
  Administered 2019-09-15: 10 mL

## 2019-09-15 MED ORDER — SODIUM CHLORIDE 0.9 % IV SOLN
Freq: Once | INTRAVENOUS | Status: AC
  Filled 2019-09-15: qty 2

## 2019-09-15 MED ORDER — SODIUM CHLORIDE 0.9 % IV SOLN: Freq: Once | INTRAVENOUS | Status: AC

## 2019-09-15 MED ORDER — TOPOTECAN HCL CHEMO INJECTION 4 MG
1.2000 mg/m2 | Freq: Once | INTRAVENOUS | Status: AC
  Administered 2019-09-15: 2.2 mg via INTRAVENOUS
  Filled 2019-09-15: qty 2.2

## 2019-09-15 NOTE — Progress Notes (Signed)
Harvie Junior tolerated Topotecan infusion well without complaints or incident. Port left accessed and flushed for use tomorrow. VSS upon discharge. Pt discharged self ambulatory in satisfactory condition

## 2019-09-15 NOTE — Patient Instructions (Signed)
[facility] Discharge Instructions for Patients Receiving Chemotherapy   Beginning January 23rd 2017 lab work for the [facility] will be done in the  Main lab at [facility] on 1st floor. If you have a lab appointment with the [facility] [place] please come in thru the  Main Entrance and check in at the main information desk   Today you received the following chemotherapy agents Topotecan. Follow-up as scheduled. Call clinic for any questions or concerns  To help prevent nausea and vomiting after your treatment, we encourage you to take your nausea medication   If you develop nausea and vomiting, or diarrhea that is not controlled by your medication, call the clinic.  The clinic phone number is [phone]. Office hours are Monday-Friday 8:30am-5:00pm.  BELOW ARE SYMPTOMS THAT SHOULD BE REPORTED IMMEDIATELY:  *FEVER GREATER THAN 101.0 F  *CHILLS WITH OR WITHOUT FEVER  NAUSEA AND VOMITING THAT IS NOT CONTROLLED WITH YOUR NAUSEA MEDICATION  *UNUSUAL SHORTNESS OF BREATH  *UNUSUAL BRUISING OR BLEEDING  TENDERNESS IN MOUTH AND THROAT WITH OR WITHOUT PRESENCE OF ULCERS  *URINARY PROBLEMS  *BOWEL PROBLEMS  UNUSUAL RASH Items with * indicate a potential emergency and should be followed up as soon as possible. If you have an emergency after office hours please contact your primary care physician or go to the nearest emergency department.  Please call the clinic during office hours if you have any questions or concerns.   You may also contact the Patient Navigator at [phone] should you have any questions or need assistance in obtaining follow up care.      Resources For Cancer Patients and their Caregivers ? American [employer] Society: Can assist with transportation, wigs, general needs, runs Look Good Feel Better.        2028346948 ? Cancer Care: Provides financial assistance, online support groups, medication/co-pay assistance.  1-800-813-HOPE  270-033-7133) ? [facility] Assists [place] cancer patients and their families through emotional , educational and financial support.  (732)123-8083 ? [place] DSS Where to apply for food stamps, Medicaid and utility assistance. 224-530-5426 ? [employer]: Transportation to medical appointments. 639-379-2965 ? Social Security Administration: May apply for disability if have a Stage IV cancer. 8600218502 (231)081-8999 ? [employer], Disability and Transit Services: Assists with nutrition, care and transit needs. (438)335-7144

## 2019-09-16 ENCOUNTER — Other Ambulatory Visit: Payer: Self-pay | Admitting: Family Medicine

## 2019-09-16 ENCOUNTER — Inpatient Hospital Stay (HOSPITAL_COMMUNITY): Payer: Medicare HMO

## 2019-09-16 VITALS — BP 136/51 | HR 49 | Temp 97.2°F | Resp 18 | Wt 155.4 lb

## 2019-09-16 DIAGNOSIS — C787 Secondary malignant neoplasm of liver and intrahepatic bile duct: Secondary | ICD-10-CM | POA: Diagnosis not present

## 2019-09-16 DIAGNOSIS — C3490 Malignant neoplasm of unspecified part of unspecified bronchus or lung: Secondary | ICD-10-CM

## 2019-09-16 DIAGNOSIS — F17210 Nicotine dependence, cigarettes, uncomplicated: Secondary | ICD-10-CM | POA: Diagnosis not present

## 2019-09-16 DIAGNOSIS — Z5189 Encounter for other specified aftercare: Secondary | ICD-10-CM | POA: Diagnosis not present

## 2019-09-16 DIAGNOSIS — Z5111 Encounter for antineoplastic chemotherapy: Secondary | ICD-10-CM | POA: Diagnosis not present

## 2019-09-16 DIAGNOSIS — C770 Secondary and unspecified malignant neoplasm of lymph nodes of head, face and neck: Secondary | ICD-10-CM | POA: Diagnosis not present

## 2019-09-16 MED ORDER — SODIUM CHLORIDE 0.9 % IV SOLN: Freq: Once | INTRAVENOUS | Status: AC

## 2019-09-16 MED ORDER — PEGFILGRASTIM 6 MG/0.6ML ~~LOC~~ PSKT
PREFILLED_SYRINGE | SUBCUTANEOUS | Status: AC
  Filled 2019-09-16: qty 0.6

## 2019-09-16 MED ORDER — PEGFILGRASTIM 6 MG/0.6ML ~~LOC~~ PSKT
6.0000 mg | PREFILLED_SYRINGE | Freq: Once | SUBCUTANEOUS | Status: AC
  Administered 2019-09-16: 6 mg via SUBCUTANEOUS

## 2019-09-16 MED ORDER — SODIUM CHLORIDE 0.9 % IV SOLN
Freq: Once | INTRAVENOUS | Status: AC
  Filled 2019-09-16: qty 2

## 2019-09-16 MED ORDER — SODIUM CHLORIDE 0.9% FLUSH
10.0000 mL | INTRAVENOUS | Status: DC | PRN
  Administered 2019-09-16: 10 mL

## 2019-09-16 MED ORDER — ATORVASTATIN CALCIUM 40 MG PO TABS: ORAL_TABLET | ORAL | 1 refills | Status: DC

## 2019-09-16 MED ORDER — HEPARIN SOD (PORK) LOCK FLUSH 100 UNIT/ML IV SOLN
500.0000 [IU] | Freq: Once | INTRAVENOUS | Status: AC | PRN
  Administered 2019-09-16: 500 [IU]

## 2019-09-16 MED ORDER — TOPOTECAN HCL CHEMO INJECTION 4 MG
1.2000 mg/m2 | Freq: Once | INTRAVENOUS | Status: AC
  Administered 2019-09-16: 2.2 mg via INTRAVENOUS
  Filled 2019-09-16: qty 2.2

## 2019-09-16 NOTE — Patient Instructions (Signed)
[facility] Discharge Instructions for Patients Receiving Chemotherapy  Today you received the following chemotherapy agents   To help prevent nausea and vomiting after your treatment, we encourage you to take your nausea medication   If you develop nausea and vomiting that is not controlled by your nausea medication, call the clinic.   BELOW ARE SYMPTOMS THAT SHOULD BE REPORTED IMMEDIATELY:  *FEVER GREATER THAN 100.5 F  *CHILLS WITH OR WITHOUT FEVER  NAUSEA AND VOMITING THAT IS NOT CONTROLLED WITH YOUR NAUSEA MEDICATION  *UNUSUAL SHORTNESS OF BREATH  *UNUSUAL BRUISING OR BLEEDING  TENDERNESS IN MOUTH AND THROAT WITH OR WITHOUT PRESENCE OF ULCERS  *URINARY PROBLEMS  *BOWEL PROBLEMS  UNUSUAL RASH Items with * indicate a potential emergency and should be followed up as soon as possible.  Feel free to call the clinic should you have any questions or concerns. The clinic phone number is [phone].  Please show the CHEMO ALERT CARD at check-in to the Emergency Department and triage nurse.   

## 2019-09-16 NOTE — Progress Notes (Signed)
Patient presents today for Day 5 Topotecan. Patient has no complaints of any changes since his last treatment. Vital signs within parameters for treatment.   Treatment given today per MD orders. Tolerated infusion without adverse affects. Vital signs stable. No complaints at this time. Discharged from clinic ambulatory. F/U with [facility] as scheduled.

## 2019-10-10 ENCOUNTER — Other Ambulatory Visit (HOSPITAL_COMMUNITY): Payer: Self-pay | Admitting: *Deleted

## 2019-10-10 DIAGNOSIS — C3490 Malignant neoplasm of unspecified part of unspecified bronchus or lung: Secondary | ICD-10-CM

## 2019-10-11 ENCOUNTER — Other Ambulatory Visit: Payer: Self-pay | Admitting: Family Medicine

## 2019-10-11 DIAGNOSIS — F5101 Primary insomnia: Secondary | ICD-10-CM

## 2019-10-11 DIAGNOSIS — F411 Generalized anxiety disorder: Secondary | ICD-10-CM

## 2019-10-11 MED ORDER — ALPRAZOLAM 1 MG PO TABS: ORAL_TABLET | ORAL | 1 refills | Status: DC

## 2019-10-11 NOTE — Progress Notes (Signed)
.  Pharmacist Chemotherapy Monitoring - Follow Up Assessment    I verify that I have reviewed each item in the below checklist:  . Regimen for the patient is scheduled for the appropriate day and plan matches scheduled date. Marland Kitchen Appropriate non-routine labs are ordered dependent on drug ordered. . If applicable, additional medications reviewed and ordered per protocol based on lifetime cumulative doses and/or treatment regimen.   Plan for follow-up and/or issues identified: No . I-vent associated with next due treatment: No . MD and/or nursing notified: No  [name] 10/11/2019 3:06 PM

## 2019-10-11 NOTE — Telephone Encounter (Signed)
Pt is requesting refill on Xanax   LOV: 12/28/2018  LRF:   09/15/2019

## 2019-10-11 NOTE — Telephone Encounter (Signed)
Patient says he prescription for alprazolam is going to be due Sunday but needs it before the weekend    Dr [facility]

## 2019-10-17 ENCOUNTER — Other Ambulatory Visit: Payer: Self-pay

## 2019-10-17 ENCOUNTER — Inpatient Hospital Stay (HOSPITAL_COMMUNITY): Payer: Medicare HMO | Admitting: Hematology

## 2019-10-17 ENCOUNTER — Encounter (HOSPITAL_COMMUNITY): Payer: Self-pay | Admitting: Hematology

## 2019-10-17 ENCOUNTER — Inpatient Hospital Stay (HOSPITAL_COMMUNITY): Payer: Medicare HMO

## 2019-10-17 ENCOUNTER — Inpatient Hospital Stay (HOSPITAL_COMMUNITY): Payer: Medicare HMO | Attending: Hematology

## 2019-10-17 VITALS — BP 111/48 | HR 51 | Temp 97.1°F | Resp 18

## 2019-10-17 DIAGNOSIS — E611 Iron deficiency: Secondary | ICD-10-CM | POA: Diagnosis not present

## 2019-10-17 DIAGNOSIS — J449 Chronic obstructive pulmonary disease, unspecified: Secondary | ICD-10-CM | POA: Diagnosis not present

## 2019-10-17 DIAGNOSIS — Z5111 Encounter for antineoplastic chemotherapy: Secondary | ICD-10-CM | POA: Insufficient documentation

## 2019-10-17 DIAGNOSIS — D508 Other iron deficiency anemias: Secondary | ICD-10-CM

## 2019-10-17 DIAGNOSIS — C3490 Malignant neoplasm of unspecified part of unspecified bronchus or lung: Secondary | ICD-10-CM

## 2019-10-17 DIAGNOSIS — D6481 Anemia due to antineoplastic chemotherapy: Secondary | ICD-10-CM | POA: Diagnosis not present

## 2019-10-17 DIAGNOSIS — C787 Secondary malignant neoplasm of liver and intrahepatic bile duct: Secondary | ICD-10-CM | POA: Insufficient documentation

## 2019-10-17 LAB — COMPREHENSIVE METABOLIC PANEL
ALT: 14 U/L (ref 0–44)
AST: 17 U/L (ref 15–41)
Albumin: 3.8 g/dL (ref 3.5–5.0)
Alkaline Phosphatase: 53 U/L (ref 38–126)
Anion gap: 8 (ref 5–15)
BUN: 22 mg/dL (ref 8–23)
CO2: 24 mmol/L (ref 22–32)
Calcium: 8.8 mg/dL — ABNORMAL LOW (ref 8.9–10.3)
Chloride: 105 mmol/L (ref 98–111)
Creatinine, Ser: 0.86 mg/dL (ref 0.61–1.24)
GFR calc Af Amer: >60 mL/min (ref >60)
GFR calc non Af Amer: >60 mL/min (ref >60)
Glucose, Bld: 98 mg/dL (ref 70–99)
Potassium: 4.1 mmol/L (ref 3.5–5.1)
Sodium: 137 mmol/L (ref 135–145)
Total Bilirubin: 0.6 mg/dL (ref 0.3–1.2)
Total Protein: 6.3 g/dL — ABNORMAL LOW (ref 6.5–8.1)

## 2019-10-17 LAB — CBC WITH DIFFERENTIAL/PLATELET
Abs Immature Granulocytes: 0.02 10*3/uL (ref 0.00–0.07)
Basophils Absolute: 0 10*3/uL (ref 0.0–0.1)
Basophils Relative: 1 %
Eosinophils Absolute: 0.1 10*3/uL (ref 0.0–0.5)
Eosinophils Relative: 1 %
HCT: 28.7 % — ABNORMAL LOW (ref 39.0–52.0)
Hemoglobin: 9 g/dL — ABNORMAL LOW (ref 13.0–17.0)
Immature Granulocytes: 0 %
Lymphocytes Relative: 18 %
Lymphs Abs: 1.2 10*3/uL (ref 0.7–4.0)
MCH: 31.3 pg (ref 26.0–34.0)
MCHC: 31.4 g/dL (ref 30.0–36.0)
MCV: 99.7 fL (ref 80.0–100.0)
Monocytes Absolute: 0.6 10*3/uL (ref 0.1–1.0)
Monocytes Relative: 9 %
Neutro Abs: 4.8 10*3/uL (ref 1.7–7.7)
Neutrophils Relative %: 71 %
Platelets: 234 10*3/uL (ref 150–400)
RBC: 2.88 MIL/uL — ABNORMAL LOW (ref 4.22–5.81)
RDW: 23.7 % — ABNORMAL HIGH (ref 11.5–15.5)
WBC: 6.7 10*3/uL (ref 4.0–10.5)
nRBC: 0 % (ref 0.0–0.2)

## 2019-10-17 LAB — MAGNESIUM: Magnesium: 1.9 mg/dL (ref 1.7–2.4)

## 2019-10-17 MED ORDER — HEPARIN SOD (PORK) LOCK FLUSH 100 UNIT/ML IV SOLN
500.0000 [IU] | Freq: Once | INTRAVENOUS | Status: AC | PRN
  Administered 2019-10-17: 13:00:00 500 [IU]

## 2019-10-17 MED ORDER — SODIUM CHLORIDE 0.9 % IV SOLN
510.0000 mg | Freq: Once | INTRAVENOUS | Status: AC
  Administered 2019-10-17: 510 mg via INTRAVENOUS
  Filled 2019-10-17: qty 510

## 2019-10-17 MED ORDER — SODIUM CHLORIDE 0.9 % IV SOLN
Freq: Once | INTRAVENOUS | Status: AC
  Filled 2019-10-17: qty 2

## 2019-10-17 MED ORDER — SODIUM CHLORIDE 0.9% FLUSH
10.0000 mL | INTRAVENOUS | Status: DC | PRN
  Administered 2019-10-17: 11:00:00 10 mL

## 2019-10-17 MED ORDER — TOPOTECAN HCL CHEMO INJECTION 4 MG
1.2000 mg/m2 | Freq: Once | INTRAVENOUS | Status: AC
  Administered 2019-10-17: 12:00:00 2.2 mg via INTRAVENOUS
  Filled 2019-10-17: qty 2.2

## 2019-10-17 MED ORDER — SODIUM CHLORIDE 0.9 % IV SOLN: Freq: Once | INTRAVENOUS | Status: AC

## 2019-10-17 NOTE — Progress Notes (Signed)
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 Bulky 5.4 cm right supraclavicular region malignant lymph node conglomeration with extracapsular extension. 2. Surrounding smaller abnormal right level 3 and level 5 lymph nodes, and the lymphadenopathy continues into the superior mediastinum, see Chest CT findings reported separately. 3. No other metastatic disease identified in the neck.   01/16/2017 Imaging   CT chest: IMPRESSION: 1. Extensive lymphadenopathy in the thorax and lower right cervical region, as discussed above. Primary differential considerations include lymphoma/leukemia or small cell carcinoma of the lung. Further evaluation a PET-CT could be considered to assess for additional sites of disease below the diaphragm if clinically appropriate. Additionally, ultrasound-guided biopsy of supraclavicular lymphadenopathy could be considered to establish a tissue diagnosis. 2. Indeterminate lesion in the periphery of segment 8 of the liver measuring 2.7 x 1.7 cm. Attention at time of follow-up PET-CT is recommended. 3. Aortic atherosclerosis, in addition to left main and 3 vessel coronary artery disease. Please note that although the presence of coronary artery calcium documents the presence of coronary artery disease, the severity of this disease and any potential stenosis cannot be assessed on this non-gated CT examination. Assessment for potential risk factor modification, dietary therapy or pharmacologic therapy may be warranted, if clinically indicated. 4. There are calcifications of the aortic valve.  Echocardiographic correlation for evaluation of potential valvular dysfunction may be warranted if clinically indicated. 5. Diffuse bronchial wall thickening with moderate centrilobular and paraseptal emphysema; imaging findings suggestive of underlying COPD.   02/03/2017 Initial Biopsy   (R) neck lymph node biopsy: SMALL CELL CARCINOMA (most likely lung primary).    02/03/2017 Miscellaneous   Port-a-cath attempted by IR; unable to place d/t enlarged SVC.    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 Staging for metastatic disease  EXAM: MRI HEAD WITHOUT AND WITH CONTRAST  TECHNIQUE: Multiplanar, multiecho pulse sequences of the brain and surrounding structures were obtained without and with intravenous contrast.  CONTRAST:  57m MULTIHANCE GADOBENATE DIMEGLUMINE 529 MG/ML IV SOLN  COMPARISON:  None.  FINDINGS: Brain: Negative for hydrocephalus. Cerebral volume normal for age. Small nonenhancing white matter hyperintensities consistent with mild chronic microvascular ischemia. No acute infarct. Negative for hemorrhage or mass or edema  Normal enhancement postcontrast infusion. No enhancing mass lesion. Leptomeningeal enhancement is normal.  Vascular: Normal arterial flow voids.  Normal venous enhancement  Skull and upper cervical spine: Negative  Sinuses/Orbits: Negative  Other: None  IMPRESSION: Negative for metastatic disease.  No acute abnormality.  Mild chronic white matter changes.   04/07/2017 Imaging    PET:  1. Marked reduction in size and metabolic activity of bulky RIGHT supraclavicular adenopathy mediastinal lymphadenopathy. 2. Residual moderate activity remains within small RIGHT supraclavicular lymph node, RIGHT lower paratracheal  lymph node and RIGHT hilar lymph node. 3. Resolution of prevascular and internal mammary mediastinal metastatic hypermetabolic activity. 4. Resolution of metabolic activity associated with solitary RIGHT  hepatic lobe liver metastasis. 5. No evidence of disease progression. 6. 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   05/27/2017 Imaging   MRI brain w/ and w/o contrast IMPRESSION: 1. No metastatic disease identified. 2. Increased nonspecific cerebral white matter signal changes since August. These are most commonly small vessel disease related. 3. New right maxillary sinusitis. Benign appearing retention cysts in the nasopharynx with trace mastoid effusions.   06/12/2017 Imaging   PET-CT IMPRESSION: 1. There are two new hypermetabolic nodules identified within both lower lobes measuring up to 3.1 cm. The appearance is nonspecific and may be inflammatory/infectious in etiology. Pulmonary metastatic disease cannot be excluded and short-term follow-up imaging in 3 months is advised to reassess these nodules. 2. Stable appearance of mild hypermetabolic activity associated with right paratracheal and right hilar lymph nodes. 3. Decrease in FDG uptake associated with index right supraclavicular lymph node. 4. 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 5. Aortic Atherosclerosis (ICD10-I70.0) and Emphysema (ICD10-J43.9).   08/24/2017 -  Chemotherapy   The patient had pegfilgrastim (NEULASTA ONPRO KIT) injection 6 mg, 6 mg, Subcutaneous, Once, 29 of 30 cycles Administration: 6 mg (08/28/2017), 6 mg (09/18/2017), 6 mg (10/09/2017), 6 mg (11/02/2017), 6 mg (11/20/2017), 6 mg (12/18/2017), 6 mg (01/15/2018), 6 mg (02/12/2018), 6 mg (03/12/2018), 6 mg (04/09/2018), 6 mg (05/07/2018), 6 mg (06/04/2018), 6 mg  (07/02/2018), 6 mg (07/30/2018), 6 mg (08/27/2018), 6 mg (09/24/2018), 6 mg (10/29/2018), 6 mg (11/26/2018), 6 mg (12/24/2018), 6 mg (01/21/2019), 6 mg (02/18/2019), 6 mg (03/18/2019), 6 mg (04/15/2019), 6 mg (05/13/2019), 6 mg (06/17/2019), 6 mg (07/22/2019), 6 mg (08/19/2019), 6 mg (09/16/2019) topotecan (HYCAMTIN) 2.9 mg in sodium chloride 0.9 % 100 mL chemo infusion, 1.5 mg/m2 = 2.9 mg, Intravenous,  Once, 29 of 30 cycles Dose modification: 1.2 mg/m2 (80 % of original dose 1.5 mg/m2, Cycle 4, Reason: Dose Not Tolerated) Administration: 2.9 mg (08/24/2017), 2.9 mg (08/25/2017), 2.9 mg (08/28/2017), 2.9 mg (08/26/2017), 2.9 mg (08/27/2017), 2.9 mg (09/14/2017), 2.9 mg (09/15/2017), 2.9 mg (09/16/2017), 2.9 mg (09/17/2017), 2.9 mg (09/18/2017), 2.9 mg (10/05/2017), 2.9 mg (10/06/2017), 2.9 mg (10/07/2017), 2.9 mg (10/08/2017), 2.9 mg (10/09/2017), 2.3 mg (10/26/2017), 2.3 mg (10/27/2017), 2.3 mg (10/28/2017), 2.3 mg (10/29/2017), 2.3 mg (11/02/2017), 2.3 mg (11/16/2017), 2.3 mg (11/17/2017), 2.3 mg (11/18/2017), 2.3 mg (11/19/2017), 2.3 mg (11/20/2017), 2.3 mg (12/14/2017), 2.3 mg (12/15/2017), 2.3 mg (12/16/2017), 2.3 mg (12/17/2017), 2.3 mg (12/18/2017), 2.3 mg (01/11/2018), 2.3 mg (01/12/2018), 2.3 mg (01/13/2018), 2.3 mg (01/15/2018), 2.3 mg (02/08/2018), 2.3 mg (02/09/2018), 2.3 mg (02/10/2018), 2.3 mg (02/11/2018), 2.3 mg (02/12/2018), 2.3 mg (03/08/2018), 2.3 mg (03/09/2018), 2.3 mg (03/10/2018), 2.3 mg (03/11/2018), 2.3 mg (03/12/2018), 2.2 mg (04/05/2018), 2.2 mg (04/06/2018), 2.2 mg (04/07/2018), 2.2 mg (04/08/2018), 2.2 mg (04/09/2018), 2.2 mg (05/03/2018), 2.2 mg (05/04/2018), 2.2 mg (05/05/2018), 2.2 mg (05/06/2018), 2.2 mg (05/07/2018), 2.2 mg (05/31/2018), 2.2 mg (06/01/2018), 2.2 mg (06/02/2018), 2.2 mg (06/03/2018), 2.2 mg (06/04/2018), 2.2 mg (06/28/2018), 2.2 mg (06/29/2018), 2.2 mg (06/30/2018), 2.2 mg (07/01/2018), 2.2 mg (07/02/2018), 2.2 mg (07/26/2018), 2.2 mg (07/27/2018), 2.2 mg (07/28/2018), 2.2 mg (07/29/2018), 2.2 mg (07/30/2018), 2.2 mg (08/23/2018), 2.2 mg (08/24/2018), 2.2  mg (08/25/2018), 2.2 mg (08/26/2018), 2.2 mg (08/27/2018), 2.2 mg (09/20/2018), 2.2 mg (09/21/2018), 2.2 mg (09/22/2018), 2.2 mg (09/23/2018), 2.2 mg (09/24/2018), 2.2 mg (10/25/2018), 2.2 mg (10/26/2018), 2.2 mg (10/27/2018), 2.2 mg (10/28/2018), 2.2 mg (10/29/2018),  2.2 mg (11/22/2018), 2.2 mg (11/23/2018), 2.2 mg (11/24/2018), 2.2 mg (11/25/2018), 2.2 mg (11/26/2018), 2.2 mg (12/20/2018), 2.2 mg (12/21/2018), 2.2 mg (12/22/2018), 2.2 mg (12/23/2018), 2.2 mg (12/24/2018), 2.2 mg (01/17/2019), 2.2 mg (01/18/2019), 2.2 mg (01/19/2019), 2.2 mg (01/20/2019), 2.2 mg (01/21/2019), 2.2 mg (02/14/2019), 2.2 mg (02/15/2019), 2.2 mg (02/16/2019), 2.2 mg (02/17/2019), 2.2 mg (02/18/2019), 2.2 mg (03/14/2019), 2.2 mg (03/15/2019), 2.2 mg (03/16/2019), 2.2 mg (03/17/2019), 2.2 mg (03/18/2019), 2.2 mg (04/11/2019), 2.2 mg (04/12/2019), 2.2 mg (04/13/2019), 2.2 mg (04/14/2019), 2.2 mg (04/15/2019), 2.2 mg (05/09/2019), 2.2 mg (05/10/2019), 2.2 mg (05/11/2019), 2.2 mg (05/12/2019), 2.2 mg (05/13/2019), 2.2 mg (06/13/2019), 2.2 mg (06/14/2019), 2.2 mg (06/15/2019), 2.2 mg (06/16/2019), 2.2 mg (06/17/2019), 2.2 mg (07/18/2019), 2.2 mg (07/19/2019), 2.2 mg (07/20/2019), 2.2 mg (07/21/2019), 2.2 mg (07/22/2019), 2.2 mg (08/15/2019), 2.2 mg (08/16/2019), 2.2 mg (08/17/2019), 2.2 mg (08/18/2019), 2.2 mg (08/19/2019), 2.2 mg (09/12/2019), 2.2 mg (09/13/2019), 2.2 mg (09/14/2019), 2.2 mg (09/15/2019), 2.2 mg (09/16/2019), 2.2 mg (10/17/2019) ondansetron (ZOFRAN) 4 mg in sodium chloride 0.9 % 50 mL IVPB, , Intravenous,  Once, 24 of 25 cycles Administration:  (02/08/2018),  (02/09/2018),  (02/10/2018),  (02/11/2018),  (02/12/2018),  (03/08/2018),  (03/09/2018),  (03/10/2018),  (03/11/2018),  (03/12/2018),  (04/05/2018),  (04/06/2018),  (04/07/2018),  (04/08/2018),  (04/09/2018),  (05/03/2018),  (05/04/2018),  (05/05/2018),  (05/06/2018),  (05/07/2018),  (05/31/2018),  (06/01/2018),  (06/02/2018),  (06/03/2018),  (06/04/2018),  (06/28/2018),  (06/29/2018),  (06/30/2018),  (07/01/2018),  (07/02/2018),  (07/26/2018),  (07/27/2018),  (07/28/2018),   (07/29/2018),  (07/30/2018),  (08/23/2018),  (08/24/2018),  (08/25/2018),  (08/26/2018),  (08/27/2018),  (09/20/2018),  (09/21/2018),  (09/22/2018),  (09/23/2018),  (09/24/2018),  (10/25/2018),  (10/26/2018),  (10/27/2018),  (10/28/2018),  (10/29/2018),  (11/22/2018),  (11/23/2018),  (11/24/2018),  (11/25/2018),  (11/26/2018),  (12/20/2018),  (12/21/2018),  (12/22/2018),  (12/23/2018),  (12/24/2018),  (01/17/2019),  (01/18/2019),  (01/19/2019),  (01/20/2019),  (01/21/2019),  (02/14/2019),  (02/15/2019),  (02/16/2019),  (02/17/2019),  (02/18/2019),  (03/14/2019),  (03/15/2019),  (03/16/2019),  (03/17/2019),  (03/18/2019),  (04/11/2019),  (04/12/2019),  (04/13/2019),  (04/14/2019),  (04/15/2019),  (05/09/2019),  (05/10/2019),  (05/11/2019),  (05/12/2019),  (05/13/2019),  (06/13/2019),  (06/14/2019),  (06/15/2019),  (06/16/2019),  (06/17/2019),  (07/18/2019),  (07/19/2019),  (07/20/2019),  (07/21/2019),  (07/22/2019),  (08/15/2019),  (08/16/2019),  (08/17/2019),  (08/18/2019),  (08/19/2019),  (09/12/2019),  (09/13/2019),  (09/14/2019),  (09/15/2019),  (09/16/2019),  (10/17/2019)  for chemotherapy treatment.         INTERVAL HISTORY:  Mr. Abdou 69 y.o. male seen for follow-up and tox adjustment prior to next cycle for his lung cancer.  Reported that he is neck lymph nodes have grown in size.  They are not hurting.  Appetite is 25%.  Energy levels are 75%.  Shortness of breath on exertion is stable.  Numbness in the feet has also been stable.  REVIEW OF SYSTEMS:  Review of Systems  Respiratory: Positive for shortness of breath.   Gastrointestinal: Positive for constipation.  Neurological: Positive for numbness.  All other systems reviewed and are negative.    PAST MEDICAL/SURGICAL HISTORY:  Past Medical History:  Diagnosis Date  . Anxiety   . CAD (coronary artery disease)   . Cancer ([place])    stage 4 small cell lung cancer   . COPD (chronic obstructive pulmonary disease) ([place])   . Depression   . Dyspnea    increased exertion  . Feeling of chest tightness   . Heart palpitations   .  History of chemotherapy   . Myocardial infarction ([place])   . Osteopenia   .  Panic attacks   . Smoker    Past Surgical History:  Procedure Laterality Date  . BACK SURGERY  12/24/2000   L5,S1  . CORONARY STENT PLACEMENT  2005   RCA & CX  . HERNIA REPAIR Right 1980's  . INGUINAL HERNIA REPAIR  12/1978   right side  . IR FLUORO GUIDE PORT INSERTION RIGHT  04/02/2017  . IR US GUIDE BX ASP/DRAIN  02/03/2017  . IR US GUIDE VASC ACCESS RIGHT  04/02/2017  . NM MYOCAR PERF WALL MOTION  09/07/2009   No ischemia; EF 51%  . SHOULDER SURGERY Left 08/2010  . SPINE SURGERY  2002   L5-S1     SOCIAL HISTORY:  Social History   Socioeconomic History  . Marital status: Single    Spouse name: Not on file  . Number of children: Not on file  . Years of education: Not on file  . Highest education level: Not on file  Occupational History  . Not on file  Tobacco Use  . Smoking status: Current Every Day Smoker    Packs/day: 1.00    Types: Cigarettes  . Smokeless tobacco: Never Used  Substance and Sexual Activity  . Alcohol use: Yes    Comment: occas  . Drug use: No  . Sexual activity: Not on file  Other Topics Concern  . Not on file  Social History Narrative  . Not on file   Social Determinants of Health   Financial Resource Strain:   . Difficulty of Paying Living Expenses:   Food Insecurity:   . Worried About Charity fundraiser in the Last Year:   . Arboriculturist in the Last Year:   Transportation Needs:   . Film/video editor (Medical):   Marland Kitchen Lack of Transportation (Non-Medical):   Physical Activity:   . Days of Exercise per Week:   . Minutes of Exercise per Session:   Stress:   . Feeling of Stress :   Social Connections:   . Frequency of Communication with Friends and Family:   . Frequency of Social Gatherings with Friends and Family:   . Attends Religious Services:   . Active Member of Clubs or Organizations:   . Attends Archivist Meetings:   Marland Kitchen Marital Status:    Intimate Partner Violence:   . Fear of Current or Ex-Partner:   . Emotionally Abused:   Marland Kitchen Physically Abused:   . Sexually Abused:     FAMILY HISTORY:  Family History  Problem Relation Age of Onset  . Heart attack Father   . Kidney disease Father        renal failure  . Heart failure Mother   . Heart attack Mother   . Cancer Brother   . Diabetes Brother   . Alcohol abuse Brother   . Diabetes Sister     CURRENT MEDICATIONS:  Outpatient Encounter Medications as of 10/17/2019  Medication Sig  . albuterol (VENTOLIN HFA) 108 (90 Base) MCG/ACT inhaler Inhale 2 puffs into the lungs every 6 (six) hours as needed for wheezing or shortness of breath.  . ALPRAZolam (XANAX) 1 MG tablet TAKE (1) TABLET BY MOUTH (4) TIMES DAILY.  Marland Kitchen Ascorbic Acid (VITAMIN C) 1000 MG tablet Take 1,000 mg by mouth daily.  Marland Kitchen aspirin EC 81 MG tablet Take 81 mg by mouth daily.  Marland Kitchen atorvastatin (LIPITOR) 40 MG tablet TAKE (1) TABLET BY MOUTH ONCE DAILY.  Marland Kitchen Bioflavonoid Products (ESTER C PO) Take 1 tablet by mouth  daily.  . calcium-vitamin D (OSCAL WITH D) 250-125 MG-UNIT tablet Take 1 tablet by mouth daily.  . cholecalciferol (VITAMIN D3) 25 MCG (1000 UT) tablet Take 1,000 Units by mouth daily.  . clotrimazole-betamethasone (LOTRISONE) cream Apply 1 application topically 2 (two) times daily.  Marland Kitchen gabapentin (NEURONTIN) 300 MG capsule Take 2 capsules (600 mg total) by mouth 3 (three) times daily.  . hydrOXYzine (ATARAX/VISTARIL) 50 MG tablet TAKE ONE TABLET BY MOUTH AT BEDTIME AS NEEDED FOR SLEEP.  . Melatonin 10 MG TABS Take 1 capsule by mouth every evening.  . Multiple Vitamins-Minerals (CENTRUM SILVER 50+MEN) TABS Take 1 tablet by mouth daily.  . ondansetron (ZOFRAN) 8 MG tablet Take 1 tablet (8 mg total) by mouth 2 (two) times daily as needed. Start on the third day after cisplatin chemotherapy.  Marland Kitchen oxyCODONE-acetaminophen (PERCOCET/ROXICET) 5-325 MG tablet TAKE 1 TABLET EVERY 8 HOURS AS NEEDED FOR SEVERE PAIN.  Marland Kitchen  Pegfilgrastim (NEULASTA ONPRO [place]) Inject into the skin. Every 21 days  . polyethylene glycol powder (GLYCOLAX/MIRALAX) powder Take 17 g by mouth daily.  . prochlorperazine (COMPAZINE) 10 MG tablet Take 1 tablet (10 mg total) by mouth every 6 (six) hours as needed (Nausea or vomiting).  Marland Kitchen senna (SENOKOT) 8.6 MG tablet Take 1 tablet by mouth daily.  . topotecan in sodium chloride 0.9 % 100 mL Inject into the vein once. Days 1-5 every 21 days  . vitamin B-12 (CYANOCOBALAMIN) 100 MCG tablet Take 100 mcg by mouth daily.   No facility-administered encounter medications on file as of 10/17/2019.    ALLERGIES:  Allergies  Allergen Reactions  . Codeine Nausea Only  . Niaspan [Niacin Er]      PHYSICAL EXAM:  ECOG Performance status: 1  Vitals:   10/17/19 0926  BP: (!) 125/51  Pulse: (!) 57  Resp: 18  Temp: (!) 97.1 F (36.2 C)  SpO2: 100%   Filed Weights   10/17/19 0926  Weight: 152 lb (68.9 kg)    Physical Exam Vitals reviewed.  Constitutional:      Appearance: Normal appearance.  Cardiovascular:     Rate and Rhythm: Normal rate and regular rhythm.     Heart sounds: Normal heart sounds.  Pulmonary:     Effort: Pulmonary effort is normal.     Breath sounds: Normal breath sounds.  Abdominal:     General: There is no distension.     Palpations: Abdomen is soft. There is no mass.  Musculoskeletal:        General: No swelling.  Skin:    General: Skin is warm.  Neurological:     General: No focal deficit present.     Mental Status: He is alert and oriented to person, place, and time.  Psychiatric:        Mood and Affect: Mood normal.        Behavior: Behavior normal.      LABORATORY DATA:  I have reviewed the labs as listed.  CBC    Component Value Date/Time   WBC 6.7 10/17/2019 0937   RBC 2.88 (L) 10/17/2019 0937   HGB 9.0 (L) 10/17/2019 0937   HCT 28.7 (L) 10/17/2019 0937   PLT 234 10/17/2019 0937   MCV 99.7 10/17/2019 0937   MCH 31.3 10/17/2019 0937   MCHC  31.4 10/17/2019 0937   RDW 23.7 (H) 10/17/2019 0937   LYMPHSABS 1.2 10/17/2019 0937   MONOABS 0.6 10/17/2019 0937   EOSABS 0.1 10/17/2019 0937   BASOSABS 0.0 10/17/2019 1517  CMP Latest Ref Rng & Units 10/17/2019 09/12/2019 08/15/2019  Glucose 70 - 99 mg/dL 98 92 108(H)  BUN 8 - 23 mg/dL '22 18 20  '$ Creatinine 0.61 - 1.24 mg/dL 0.86 0.91 0.88  Sodium 135 - 145 mmol/L 137 137 138  Potassium 3.5 - 5.1 mmol/L 4.1 4.0 4.0  Chloride 98 - 111 mmol/L 105 104 105  CO2 22 - 32 mmol/L '24 25 24  '$ Calcium 8.9 - 10.3 mg/dL 8.8(L) 8.5(L) 9.2  Total Protein 6.5 - 8.1 g/dL 6.3(L) 6.4(L) 6.6  Total Bilirubin 0.3 - 1.2 mg/dL 0.6 0.5 0.6  Alkaline Phos 38 - 126 U/L 53 58 61  AST 15 - 41 U/L '17 17 16  '$ ALT 0 - 44 U/L '14 15 15    '$ I have reviewed the scans with the patient.   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  Small cell lung cancer with liver and lung metastasis: -Topotecan started on 08/24/2017, dose reduced to 1.2 mg per metered squared during cycle 4. -CT CAP from 09/07/2019 showed stable small bilateral lung nodules, 8 mm largest in the left lung base with no evidence of metastatic disease in the abdomen or pelvis. -MRI of the brain on 09/07/2019 did not show any metastatic disease. -Patient complained of more prominent lymphadenopathy in the right neck.  He thinks it has grown slightly. -I have reviewed scans.  Even though it is not reported, lymph nodes were seen on the recent scan.  However they were much smaller on the prior scan.  We will keep a close eye on it. -I have reviewed his labs including LFTs.  White count is normal.  He will proceed with his next cycle today.  I will see him back in 4 weeks for follow-up.  2.  Normocytic anemia: -This is chemotherapy-induced with relative iron deficiency.  He received Feraheme 4 weeks ago.  He felt better.  Hemoglobin also improved by one-point. -He will receive another infusion today.  3.  Chronic  pain: -He takes Percocet 1 tablet daily.  He cannot take more than 1 tablet as it causes severe constipation.  4.  Peripheral neuropathy: -He will continue gabapentin 600 mg 3 times a day.     Orders placed this encounter:  No orders of the defined types were placed in this encounter.     Derek Jack, MD [place] [phone]

## 2019-10-17 NOTE — Progress Notes (Signed)
Patient tolerated chemotherapy and Ferahame with no complaints voiced.  Side effects with management reviewed with understanding verbalized.  Port site clean and dry with no bruising or swelling noted at site.  Good blood return noted before and after administration of chemotherapy.  Dressing intact.   Patient left ambulatory with VSS and no s/s of distress noted.

## 2019-10-17 NOTE — Progress Notes (Signed)
Patient has been assessed, vital signs and labs have been reviewed by Dr. Delton Coombes. ANC, Creatinine, LFTs, and Platelets are within treatment parameters per Dr. Delton Coombes. Feraheme today per Dr. Delton Coombes.  The patient is good to proceed with treatment at this time.

## 2019-10-17 NOTE — Patient Instructions (Addendum)
[place] at [facility] Discharge Instructions  You were seen today by Dr. Delton Coombes. He went over your recent lab and scan results. You will receive iron IV today.  He will see you back in 4 weeks for labs, treatment and follow up.   Thank you for choosing [place] at [facility] to provide your oncology and hematology care.  To afford each patient quality time with our provider, please arrive at least 15 minutes before your scheduled appointment time.   If you have a lab appointment with the [facility] please come in thru the  Main Entrance and check in at the main information desk  You need to re-schedule your appointment should you arrive 10 or more minutes late.  We strive to give you quality time with our providers, and arriving late affects you and other patients whose appointments are after yours.  Also, if you no show three or more times for appointments you may be dismissed from the clinic at the providers discretion.     Again, thank you for choosing [facility].  Our hope is that these requests will decrease the amount of time that you wait before being seen by our physicians.       _____________________________________________________________  Should you have questions after your visit to [facility], please contact our office at [phone] between the hours of 8:00 a.m. and 4:30 p.m.  Voicemails left after 4:00 p.m. will not be returned until the following business day.  For prescription refill requests, have your pharmacy contact our office and allow 72 hours.    Cancer Center Support Programs:   > Cancer Support Group  2nd Tuesday of the month 1pm-2pm, Journey Room

## 2019-10-17 NOTE — Assessment & Plan Note (Signed)
1.  Small cell lung cancer with liver and lung metastasis: -Topotecan started on 08/24/2017, dose reduced to 1.2 mg per metered squared during cycle 4. -CT CAP from 09/07/2019 showed stable small bilateral lung nodules, 8 mm largest in the left lung base with no evidence of metastatic disease in the abdomen or pelvis. -MRI of the brain on 09/07/2019 did not show any metastatic disease. -Patient complained of more prominent lymphadenopathy in the right neck.  He thinks it has grown slightly. -I have reviewed scans.  Even though it is not reported, lymph nodes were seen on the recent scan.  However they were much smaller on the prior scan.  We will keep a close eye on it. -I have reviewed his labs including LFTs.  White count is normal.  He will proceed with his next cycle today.  I will see him back in 4 weeks for follow-up.  2.  Normocytic anemia: -This is chemotherapy-induced with relative iron deficiency.  He received Feraheme 4 weeks ago.  He felt better.  Hemoglobin also improved by one-point. -He will receive another infusion today.  3.  Chronic pain: -He takes Percocet 1 tablet daily.  He cannot take more than 1 tablet as it causes severe constipation.  4.  Peripheral neuropathy: -He will continue gabapentin 600 mg 3 times a day.

## 2019-10-18 ENCOUNTER — Inpatient Hospital Stay (HOSPITAL_COMMUNITY): Payer: Medicare HMO

## 2019-10-18 ENCOUNTER — Encounter (HOSPITAL_COMMUNITY): Payer: Self-pay

## 2019-10-18 VITALS — BP 117/67 | HR 52 | Temp 97.9°F | Resp 18

## 2019-10-18 DIAGNOSIS — E611 Iron deficiency: Secondary | ICD-10-CM | POA: Diagnosis not present

## 2019-10-18 DIAGNOSIS — C3490 Malignant neoplasm of unspecified part of unspecified bronchus or lung: Secondary | ICD-10-CM | POA: Diagnosis not present

## 2019-10-18 DIAGNOSIS — D6481 Anemia due to antineoplastic chemotherapy: Secondary | ICD-10-CM | POA: Diagnosis not present

## 2019-10-18 DIAGNOSIS — C787 Secondary malignant neoplasm of liver and intrahepatic bile duct: Secondary | ICD-10-CM | POA: Diagnosis not present

## 2019-10-18 DIAGNOSIS — Z5111 Encounter for antineoplastic chemotherapy: Secondary | ICD-10-CM | POA: Diagnosis not present

## 2019-10-18 MED ORDER — HEPARIN SOD (PORK) LOCK FLUSH 100 UNIT/ML IV SOLN
500.0000 [IU] | Freq: Once | INTRAVENOUS | Status: AC | PRN
  Administered 2019-10-18: 500 [IU]

## 2019-10-18 MED ORDER — SODIUM CHLORIDE 0.9 % IV SOLN
Freq: Once | INTRAVENOUS | Status: AC
  Filled 2019-10-18: qty 2

## 2019-10-18 MED ORDER — SODIUM CHLORIDE 0.9 % IV SOLN: Freq: Once | INTRAVENOUS | Status: AC

## 2019-10-18 MED ORDER — TOPOTECAN HCL CHEMO INJECTION 4 MG
1.2000 mg/m2 | Freq: Once | INTRAVENOUS | Status: AC
  Administered 2019-10-18: 2.2 mg via INTRAVENOUS
  Filled 2019-10-18: qty 2.2

## 2019-10-18 MED ORDER — SODIUM CHLORIDE 0.9% FLUSH
10.0000 mL | INTRAVENOUS | Status: DC | PRN
  Administered 2019-10-18: 10 mL

## 2019-10-18 NOTE — Patient Instructions (Signed)
[facility] Discharge Instructions for Patients Receiving Chemotherapy   Beginning January 23rd 2017 lab work for the [facility] will be done in the  Main lab at [facility] on 1st floor. If you have a lab appointment with the [facility] please come in thru the  Main Entrance and check in at the main information desk   Today you received the following chemotherapy agents Topotecan. Follow-up as scheduled. Call clinic for any questions or concerns  To help prevent nausea and vomiting after your treatment, we encourage you to take your nausea medication   If you develop nausea and vomiting, or diarrhea that is not controlled by your medication, call the clinic.  The clinic phone number is [phone]. Office hours are Monday-Friday 8:30am-5:00pm.  BELOW ARE SYMPTOMS THAT SHOULD BE REPORTED IMMEDIATELY:  *FEVER GREATER THAN 101.0 F  *CHILLS WITH OR WITHOUT FEVER  NAUSEA AND VOMITING THAT IS NOT CONTROLLED WITH YOUR NAUSEA MEDICATION  *UNUSUAL SHORTNESS OF BREATH  *UNUSUAL BRUISING OR BLEEDING  TENDERNESS IN MOUTH AND THROAT WITH OR WITHOUT PRESENCE OF ULCERS  *URINARY PROBLEMS  *BOWEL PROBLEMS  UNUSUAL RASH Items with * indicate a potential emergency and should be followed up as soon as possible. If you have an emergency after office hours please contact your primary care physician or go to the nearest emergency department.  Please call the clinic during office hours if you have any questions or concerns.   You may also contact the Patient Navigator at [phone] should you have any questions or need assistance in obtaining follow up care.      Resources For Cancer Patients and their Caregivers ? American Cancer Society: Can assist with transportation, wigs, general needs, runs Look Good Feel Better.        731-887-7753 ? Cancer Care: Provides financial assistance, online support groups, medication/co-pay assistance.  1-800-813-HOPE  (416)040-1141) ? [facility] Assists [place] cancer patients and their families through emotional , educational and financial support.  930-690-1264 ? [place] DSS Where to apply for food stamps, Medicaid and utility assistance. (918)600-6461 ? [employer]: Transportation to medical appointments. 848-722-6920 ? Social Security Administration: May apply for disability if have a Stage IV cancer. 510-240-4694 226-377-5566 ? [employer], Disability and Transit Services: Assists with nutrition, care and transit needs. 713-695-9445

## 2019-10-18 NOTE — Progress Notes (Signed)
[name] tolerated Topotecan infusion well without complaints or incident. Port left accessed and flushed for use tomorrow. VSS upon discharge. Pt discharged self ambulatory in satisfactory condition

## 2019-10-19 ENCOUNTER — Inpatient Hospital Stay (HOSPITAL_COMMUNITY): Payer: Medicare HMO

## 2019-10-19 ENCOUNTER — Other Ambulatory Visit: Payer: Self-pay

## 2019-10-19 VITALS — BP 124/46 | HR 47 | Temp 97.7°F | Resp 18

## 2019-10-19 DIAGNOSIS — C3490 Malignant neoplasm of unspecified part of unspecified bronchus or lung: Secondary | ICD-10-CM

## 2019-10-19 DIAGNOSIS — D6481 Anemia due to antineoplastic chemotherapy: Secondary | ICD-10-CM | POA: Diagnosis not present

## 2019-10-19 DIAGNOSIS — Z5111 Encounter for antineoplastic chemotherapy: Secondary | ICD-10-CM | POA: Diagnosis not present

## 2019-10-19 DIAGNOSIS — E611 Iron deficiency: Secondary | ICD-10-CM | POA: Diagnosis not present

## 2019-10-19 DIAGNOSIS — C787 Secondary malignant neoplasm of liver and intrahepatic bile duct: Secondary | ICD-10-CM | POA: Diagnosis not present

## 2019-10-19 MED ORDER — SODIUM CHLORIDE 0.9% FLUSH
10.0000 mL | INTRAVENOUS | Status: DC | PRN
  Administered 2019-10-19 (×2): 10 mL

## 2019-10-19 MED ORDER — HEPARIN SOD (PORK) LOCK FLUSH 100 UNIT/ML IV SOLN
500.0000 [IU] | Freq: Once | INTRAVENOUS | Status: AC | PRN
  Administered 2019-10-19: 500 [IU]

## 2019-10-19 MED ORDER — TOPOTECAN HCL CHEMO INJECTION 4 MG
1.2000 mg/m2 | Freq: Once | INTRAVENOUS | Status: AC
  Administered 2019-10-19: 14:00:00 2.2 mg via INTRAVENOUS
  Filled 2019-10-19: qty 2.2

## 2019-10-19 MED ORDER — SODIUM CHLORIDE 0.9 % IV SOLN
Freq: Once | INTRAVENOUS | Status: AC
  Filled 2019-10-19: qty 2

## 2019-10-19 MED ORDER — SODIUM CHLORIDE 0.9 % IV SOLN: Freq: Once | INTRAVENOUS | Status: AC

## 2019-10-19 NOTE — Patient Instructions (Signed)
[facility] Discharge Instructions for Patients Receiving Chemotherapy  Today you received the following chemotherapy agents   To help prevent nausea and vomiting after your treatment, we encourage you to take your nausea medication   If you develop nausea and vomiting that is not controlled by your nausea medication, call the clinic.   BELOW ARE SYMPTOMS THAT SHOULD BE REPORTED IMMEDIATELY:  *FEVER GREATER THAN 100.5 F  *CHILLS WITH OR WITHOUT FEVER  NAUSEA AND VOMITING THAT IS NOT CONTROLLED WITH YOUR NAUSEA MEDICATION  *UNUSUAL SHORTNESS OF BREATH  *UNUSUAL BRUISING OR BLEEDING  TENDERNESS IN MOUTH AND THROAT WITH OR WITHOUT PRESENCE OF ULCERS  *URINARY PROBLEMS  *BOWEL PROBLEMS  UNUSUAL RASH Items with * indicate a potential emergency and should be followed up as soon as possible.  Feel free to call the clinic should you have any questions or concerns. The clinic phone number is [phone].  Please show the CHEMO ALERT CARD at check-in to the Emergency Department and triage nurse.   

## 2019-10-19 NOTE — Progress Notes (Signed)
Treatment given today per MD orders. Tolerated infusion without adverse affects. Vital signs stable. No complaints at this time. Discharged from clinic ambulatory. F/U with [facility] as scheduled.   

## 2019-10-20 ENCOUNTER — Inpatient Hospital Stay (HOSPITAL_COMMUNITY): Payer: Medicare HMO

## 2019-10-20 VITALS — BP 120/46 | HR 53 | Temp 98.0°F | Resp 18

## 2019-10-20 DIAGNOSIS — E611 Iron deficiency: Secondary | ICD-10-CM | POA: Diagnosis not present

## 2019-10-20 DIAGNOSIS — C3490 Malignant neoplasm of unspecified part of unspecified bronchus or lung: Secondary | ICD-10-CM

## 2019-10-20 DIAGNOSIS — C787 Secondary malignant neoplasm of liver and intrahepatic bile duct: Secondary | ICD-10-CM | POA: Diagnosis not present

## 2019-10-20 DIAGNOSIS — D6481 Anemia due to antineoplastic chemotherapy: Secondary | ICD-10-CM | POA: Diagnosis not present

## 2019-10-20 DIAGNOSIS — Z5111 Encounter for antineoplastic chemotherapy: Secondary | ICD-10-CM | POA: Diagnosis not present

## 2019-10-20 MED ORDER — SODIUM CHLORIDE 0.9% FLUSH
10.0000 mL | INTRAVENOUS | Status: DC | PRN
  Administered 2019-10-20: 13:00:00 10 mL

## 2019-10-20 MED ORDER — SODIUM CHLORIDE 0.9 % IV SOLN: Freq: Once | INTRAVENOUS | Status: AC

## 2019-10-20 MED ORDER — TOPOTECAN HCL CHEMO INJECTION 4 MG
1.2000 mg/m2 | Freq: Once | INTRAVENOUS | Status: AC
  Administered 2019-10-20: 14:00:00 2.2 mg via INTRAVENOUS
  Filled 2019-10-20: qty 2.2

## 2019-10-20 MED ORDER — SODIUM CHLORIDE 0.9 % IV SOLN
Freq: Once | INTRAVENOUS | Status: AC
  Filled 2019-10-20: qty 2

## 2019-10-20 MED ORDER — HEPARIN SOD (PORK) LOCK FLUSH 100 UNIT/ML IV SOLN
500.0000 [IU] | Freq: Once | INTRAVENOUS | Status: AC | PRN
  Administered 2019-10-20: 15:00:00 500 [IU]

## 2019-10-20 NOTE — Patient Instructions (Signed)
[facility] Discharge Instructions for Patients Receiving Chemotherapy  Today you received the following chemotherapy agents   To help prevent nausea and vomiting after your treatment, we encourage you to take your nausea medication   If you develop nausea and vomiting that is not controlled by your nausea medication, call the clinic.   BELOW ARE SYMPTOMS THAT SHOULD BE REPORTED IMMEDIATELY:  *FEVER GREATER THAN 100.5 F  *CHILLS WITH OR WITHOUT FEVER  NAUSEA AND VOMITING THAT IS NOT CONTROLLED WITH YOUR NAUSEA MEDICATION  *UNUSUAL SHORTNESS OF BREATH  *UNUSUAL BRUISING OR BLEEDING  TENDERNESS IN MOUTH AND THROAT WITH OR WITHOUT PRESENCE OF ULCERS  *URINARY PROBLEMS  *BOWEL PROBLEMS  UNUSUAL RASH Items with * indicate a potential emergency and should be followed up as soon as possible.  Feel free to call the clinic should you have any questions or concerns. The clinic phone number is [phone].  Please show the CHEMO ALERT CARD at check-in to the Emergency Department and triage nurse.   

## 2019-10-20 NOTE — Progress Notes (Signed)
Treatment given today per MD orders. Tolerated infusion without adverse affects. Vital signs stable. No complaints at this time. Discharged from clinic ambulatory. F/U with [facility] as scheduled.   

## 2019-10-21 ENCOUNTER — Other Ambulatory Visit: Payer: Self-pay

## 2019-10-21 ENCOUNTER — Inpatient Hospital Stay (HOSPITAL_COMMUNITY): Payer: Medicare HMO

## 2019-10-21 VITALS — BP 127/50 | HR 58 | Temp 97.3°F | Resp 18

## 2019-10-21 DIAGNOSIS — D6481 Anemia due to antineoplastic chemotherapy: Secondary | ICD-10-CM | POA: Diagnosis not present

## 2019-10-21 DIAGNOSIS — C787 Secondary malignant neoplasm of liver and intrahepatic bile duct: Secondary | ICD-10-CM | POA: Diagnosis not present

## 2019-10-21 DIAGNOSIS — E611 Iron deficiency: Secondary | ICD-10-CM | POA: Diagnosis not present

## 2019-10-21 DIAGNOSIS — C3490 Malignant neoplasm of unspecified part of unspecified bronchus or lung: Secondary | ICD-10-CM | POA: Diagnosis not present

## 2019-10-21 DIAGNOSIS — Z5111 Encounter for antineoplastic chemotherapy: Secondary | ICD-10-CM | POA: Diagnosis not present

## 2019-10-21 MED ORDER — PEGFILGRASTIM 6 MG/0.6ML ~~LOC~~ PSKT
6.0000 mg | PREFILLED_SYRINGE | Freq: Once | SUBCUTANEOUS | Status: AC
  Administered 2019-10-21: 12:00:00 6 mg via SUBCUTANEOUS

## 2019-10-21 MED ORDER — SODIUM CHLORIDE 0.9% FLUSH
10.0000 mL | INTRAVENOUS | Status: DC | PRN
  Administered 2019-10-21 (×2): 10 mL

## 2019-10-21 MED ORDER — SODIUM CHLORIDE 0.9 % IV SOLN: Freq: Once | INTRAVENOUS | Status: AC

## 2019-10-21 MED ORDER — HEPARIN SOD (PORK) LOCK FLUSH 100 UNIT/ML IV SOLN
500.0000 [IU] | Freq: Once | INTRAVENOUS | Status: AC | PRN
  Administered 2019-10-21: 500 [IU]

## 2019-10-21 MED ORDER — SODIUM CHLORIDE 0.9 % IV SOLN
Freq: Once | INTRAVENOUS | Status: AC
  Filled 2019-10-21: qty 2

## 2019-10-21 MED ORDER — PEGFILGRASTIM 6 MG/0.6ML ~~LOC~~ PSKT
PREFILLED_SYRINGE | SUBCUTANEOUS | Status: AC
  Filled 2019-10-21: qty 0.6

## 2019-10-21 MED ORDER — TOPOTECAN HCL CHEMO INJECTION 4 MG
1.2000 mg/m2 | Freq: Once | INTRAVENOUS | Status: AC
  Administered 2019-10-21: 2.2 mg via INTRAVENOUS
  Filled 2019-10-21: qty 2.2

## 2019-10-21 NOTE — Progress Notes (Signed)
Patient presents today for Day 5 Topotecan. Vital signs stable. Patient has no complaints of any pain today or changes since his last visit.   Treatment given today per MD orders. Tolerated infusion without adverse affects. Vital signs stable. No complaints at this time. Discharged from clinic ambulatory. F/U with [facility] as scheduled.

## 2019-10-21 NOTE — Patient Instructions (Signed)
[facility] Discharge Instructions for Patients Receiving Chemotherapy  Today you received the following chemotherapy agents   To help prevent nausea and vomiting after your treatment, we encourage you to take your nausea medication   If you develop nausea and vomiting that is not controlled by your nausea medication, call the clinic.   BELOW ARE SYMPTOMS THAT SHOULD BE REPORTED IMMEDIATELY:  *FEVER GREATER THAN 100.5 F  *CHILLS WITH OR WITHOUT FEVER  NAUSEA AND VOMITING THAT IS NOT CONTROLLED WITH YOUR NAUSEA MEDICATION  *UNUSUAL SHORTNESS OF BREATH  *UNUSUAL BRUISING OR BLEEDING  TENDERNESS IN MOUTH AND THROAT WITH OR WITHOUT PRESENCE OF ULCERS  *URINARY PROBLEMS  *BOWEL PROBLEMS  UNUSUAL RASH Items with * indicate a potential emergency and should be followed up as soon as possible.  Feel free to call the clinic should you have any questions or concerns. The clinic phone number is [phone].  Please show the CHEMO ALERT CARD at check-in to the Emergency Department and triage nurse.   

## 2019-11-02 ENCOUNTER — Other Ambulatory Visit (HOSPITAL_COMMUNITY): Payer: Self-pay | Admitting: Hematology

## 2019-11-02 DIAGNOSIS — C3490 Malignant neoplasm of unspecified part of unspecified bronchus or lung: Secondary | ICD-10-CM

## 2019-11-03 ENCOUNTER — Other Ambulatory Visit: Payer: Self-pay | Admitting: Family Medicine

## 2019-11-03 DIAGNOSIS — T451X5A Adverse effect of antineoplastic and immunosuppressive drugs, initial encounter: Secondary | ICD-10-CM

## 2019-11-03 DIAGNOSIS — C3490 Malignant neoplasm of unspecified part of unspecified bronchus or lung: Secondary | ICD-10-CM

## 2019-11-03 DIAGNOSIS — G620 Drug-induced polyneuropathy: Secondary | ICD-10-CM

## 2019-11-03 MED ORDER — GABAPENTIN 300 MG PO CAPS: 600.0000 mg | ORAL_CAPSULE | Freq: Three times a day (TID) | ORAL | 3 refills | Status: DC

## 2019-11-11 ENCOUNTER — Other Ambulatory Visit: Payer: Self-pay | Admitting: Family Medicine

## 2019-11-11 DIAGNOSIS — F5101 Primary insomnia: Secondary | ICD-10-CM

## 2019-11-11 DIAGNOSIS — F411 Generalized anxiety disorder: Secondary | ICD-10-CM

## 2019-11-11 NOTE — Telephone Encounter (Signed)
Pt is requesting refill on Xanax   LOV: 12/28/2018  LRF:   10/11/2019

## 2019-11-14 ENCOUNTER — Encounter (HOSPITAL_COMMUNITY): Payer: Self-pay | Admitting: Hematology

## 2019-11-14 ENCOUNTER — Other Ambulatory Visit: Payer: Self-pay

## 2019-11-14 ENCOUNTER — Inpatient Hospital Stay (HOSPITAL_COMMUNITY): Payer: Medicare HMO | Admitting: Hematology

## 2019-11-14 ENCOUNTER — Inpatient Hospital Stay (HOSPITAL_COMMUNITY): Payer: Medicare HMO | Attending: Hematology

## 2019-11-14 ENCOUNTER — Inpatient Hospital Stay (HOSPITAL_COMMUNITY): Payer: Medicare HMO

## 2019-11-14 VITALS — BP 104/52 | HR 56 | Temp 97.6°F | Resp 18

## 2019-11-14 VITALS — BP 111/47 | HR 61 | Temp 97.5°F | Resp 18 | Wt 152.0 lb

## 2019-11-14 DIAGNOSIS — D6481 Anemia due to antineoplastic chemotherapy: Secondary | ICD-10-CM | POA: Insufficient documentation

## 2019-11-14 DIAGNOSIS — C787 Secondary malignant neoplasm of liver and intrahepatic bile duct: Secondary | ICD-10-CM | POA: Insufficient documentation

## 2019-11-14 DIAGNOSIS — C3490 Malignant neoplasm of unspecified part of unspecified bronchus or lung: Secondary | ICD-10-CM

## 2019-11-14 DIAGNOSIS — M8580 Other specified disorders of bone density and structure, unspecified site: Secondary | ICD-10-CM | POA: Diagnosis not present

## 2019-11-14 DIAGNOSIS — Z79899 Other long term (current) drug therapy: Secondary | ICD-10-CM | POA: Diagnosis not present

## 2019-11-14 DIAGNOSIS — G8929 Other chronic pain: Secondary | ICD-10-CM | POA: Insufficient documentation

## 2019-11-14 DIAGNOSIS — Z5111 Encounter for antineoplastic chemotherapy: Secondary | ICD-10-CM | POA: Diagnosis not present

## 2019-11-14 DIAGNOSIS — C770 Secondary and unspecified malignant neoplasm of lymph nodes of head, face and neck: Secondary | ICD-10-CM | POA: Diagnosis not present

## 2019-11-14 LAB — COMPREHENSIVE METABOLIC PANEL
ALT: 14 U/L (ref 0–44)
AST: 13 U/L — ABNORMAL LOW (ref 15–41)
Albumin: 3.9 g/dL (ref 3.5–5.0)
Alkaline Phosphatase: 56 U/L (ref 38–126)
Anion gap: 7 (ref 5–15)
BUN: 21 mg/dL (ref 8–23)
CO2: 24 mmol/L (ref 22–32)
Calcium: 8.6 mg/dL — ABNORMAL LOW (ref 8.9–10.3)
Chloride: 104 mmol/L (ref 98–111)
Creatinine, Ser: 0.93 mg/dL (ref 0.61–1.24)
GFR calc Af Amer: >60 mL/min (ref >60)
GFR calc non Af Amer: >60 mL/min (ref >60)
Glucose, Bld: 105 mg/dL — ABNORMAL HIGH (ref 70–99)
Potassium: 4.2 mmol/L (ref 3.5–5.1)
Sodium: 135 mmol/L (ref 135–145)
Total Bilirubin: 0.7 mg/dL (ref 0.3–1.2)
Total Protein: 6.7 g/dL (ref 6.5–8.1)

## 2019-11-14 LAB — CBC WITH DIFFERENTIAL/PLATELET
Abs Immature Granulocytes: 0.03 10*3/uL (ref 0.00–0.07)
Basophils Absolute: 0 10*3/uL (ref 0.0–0.1)
Basophils Relative: 1 %
Eosinophils Absolute: 0.1 10*3/uL (ref 0.0–0.5)
Eosinophils Relative: 1 %
HCT: 27.8 % — ABNORMAL LOW (ref 39.0–52.0)
Hemoglobin: 8.8 g/dL — ABNORMAL LOW (ref 13.0–17.0)
Immature Granulocytes: 1 %
Lymphocytes Relative: 21 %
Lymphs Abs: 1.4 10*3/uL (ref 0.7–4.0)
MCH: 32.2 pg (ref 26.0–34.0)
MCHC: 31.7 g/dL (ref 30.0–36.0)
MCV: 101.8 fL — ABNORMAL HIGH (ref 80.0–100.0)
Monocytes Absolute: 0.8 10*3/uL (ref 0.1–1.0)
Monocytes Relative: 11 %
Neutro Abs: 4.3 10*3/uL (ref 1.7–7.7)
Neutrophils Relative %: 65 %
Platelets: 382 10*3/uL (ref 150–400)
RBC: 2.73 MIL/uL — ABNORMAL LOW (ref 4.22–5.81)
RDW: 23.4 % — ABNORMAL HIGH (ref 11.5–15.5)
WBC: 6.6 10*3/uL (ref 4.0–10.5)
nRBC: 0 % (ref 0.0–0.2)

## 2019-11-14 LAB — LACTATE DEHYDROGENASE: LDH: 139 U/L (ref 98–192)

## 2019-11-14 LAB — MAGNESIUM: Magnesium: 2.2 mg/dL (ref 1.7–2.4)

## 2019-11-14 MED ORDER — HEPARIN SOD (PORK) LOCK FLUSH 100 UNIT/ML IV SOLN
500.0000 [IU] | Freq: Once | INTRAVENOUS | Status: AC | PRN
  Administered 2019-11-14: 500 [IU]

## 2019-11-14 MED ORDER — SODIUM CHLORIDE 0.9 % IV SOLN: Freq: Once | INTRAVENOUS | Status: AC

## 2019-11-14 MED ORDER — SODIUM CHLORIDE 0.9 % IV SOLN
Freq: Once | INTRAVENOUS | Status: AC
  Filled 2019-11-14: qty 2

## 2019-11-14 MED ORDER — TOPOTECAN HCL CHEMO INJECTION 4 MG
1.2000 mg/m2 | Freq: Once | INTRAVENOUS | Status: AC
  Administered 2019-11-14: 2.2 mg via INTRAVENOUS
  Filled 2019-11-14: qty 2.2

## 2019-11-14 MED ORDER — SODIUM CHLORIDE 0.9% FLUSH
10.0000 mL | INTRAVENOUS | Status: DC | PRN
  Administered 2019-11-14: 10 mL

## 2019-11-14 NOTE — Progress Notes (Signed)
Patient has been assessed, vital signs and labs have been reviewed by Dr. Katragadda. ANC, Creatinine, LFTs, and Platelets are within treatment parameters per Dr. Katragadda. The patient is good to proceed with treatment at this time.  

## 2019-11-14 NOTE — Progress Notes (Signed)
[place] reviewed with and pt seen by Dr. Delton Coombes and pt approved for Topotecan infusion today per MD                               Harvie Junior tolerated Topotecan infusion well without complaints or incident. Port left accessed,flushed and saline locked for use tomorrow. VSS upon discharge. Pt discharged self ambulatory in satisfactory condition

## 2019-11-14 NOTE — Assessment & Plan Note (Signed)
1.  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 -CT CAP on 09/07/2019 showed stable small bilateral lung nodules, 8 mm largest in the left lung base with no evidence of metastatic disease in the abdomen or pelvis. -MRI of the brain on 09/07/2019 did not show any metastatic disease. -Lymphadenopathy in the right neck region is stable. -I reviewed his labs today are adequate to proceed with next cycle of chemotherapy.  I will see him back in 4 weeks.  I plan to repeat CT neck, chest, abdomen and pelvis prior to next visit in 4 weeks.  2.  Normocytic anemia: -Chemotherapy-induced with relative iron deficiency.  He received Feraheme on 10/17/2019. -Hemoglobin today is 8.8.  3.  Chronic pain: -He is taking Percocet 1 tablet daily.  If he takes more, he will get constipated.  4.  Peripheral neuropathy: -Continue gabapentin 600 mg 3 times a day.

## 2019-11-14 NOTE — Patient Instructions (Addendum)
[place] at [facility] Discharge Instructions  You were seen today by Dr. Delton Coombes. He went over your recent lab results. He will repeat your scans prior to your next visit. He will see you back in 4 weeks for labs, treatment and follow up.   Thank you for choosing [place] at [facility] to provide your oncology and hematology care.  To afford each patient quality time with our provider, please arrive at least 15 minutes before your scheduled appointment time.   If you have a lab appointment with the [facility] please come in thru the  Main Entrance and check in at the main information desk  You need to re-schedule your appointment should you arrive 10 or more minutes late.  We strive to give you quality time with our providers, and arriving late affects you and other patients whose appointments are after yours.  Also, if you no show three or more times for appointments you may be dismissed from the clinic at the providers discretion.     Again, thank you for choosing [facility].  Our hope is that these requests will decrease the amount of time that you wait before being seen by our physicians.       _____________________________________________________________  Should you have questions after your visit to [facility], please contact our office at [phone] between the hours of 8:00 a.m. and 4:30 p.m.  Voicemails left after 4:00 p.m. will not be returned until the following business day.  For prescription refill requests, have your pharmacy contact our office and allow 72 hours.    Cancer Center Support Programs:   > Cancer Support Group  2nd Tuesday of the month 1pm-2pm, Journey Room

## 2019-11-14 NOTE — Patient Instructions (Signed)
[facility] Discharge Instructions for Patients Receiving Chemotherapy   Beginning January 23rd 2017 lab work for the [facility] will be done in the  Main lab at [facility] on 1st floor. If you have a lab appointment with the [facility] please come in thru the  Main Entrance and check in at the main information desk   Today you received the following chemotherapy agents Topotecan. Follow-up as scheduled. Call clinic for any questions or concerns  To help prevent nausea and vomiting after your treatment, we encourage you to take your nausea medication   If you develop nausea and vomiting, or diarrhea that is not controlled by your medication, call the clinic.  The clinic phone number is [phone]. Office hours are Monday-Friday 8:30am-5:00pm.  BELOW ARE SYMPTOMS THAT SHOULD BE REPORTED IMMEDIATELY:  *FEVER GREATER THAN 101.0 F  *CHILLS WITH OR WITHOUT FEVER  NAUSEA AND VOMITING THAT IS NOT CONTROLLED WITH YOUR NAUSEA MEDICATION  *UNUSUAL SHORTNESS OF BREATH  *UNUSUAL BRUISING OR BLEEDING  TENDERNESS IN MOUTH AND THROAT WITH OR WITHOUT PRESENCE OF ULCERS  *URINARY PROBLEMS  *BOWEL PROBLEMS  UNUSUAL RASH Items with * indicate a potential emergency and should be followed up as soon as possible. If you have an emergency after office hours please contact your primary care physician or go to the nearest emergency department.  Please call the clinic during office hours if you have any questions or concerns.   You may also contact the Patient Navigator at [phone] should you have any questions or need assistance in obtaining follow up care.      Resources For Cancer Patients and their Caregivers ? American Cancer Society: Can assist with transportation, wigs, general needs, runs Look Good Feel Better.        317-045-9847 ? Cancer Care: Provides financial assistance, online support groups, medication/co-pay assistance.  1-800-813-HOPE  307-521-7538) ? [facility] Assists [place] cancer patients and their families through emotional , educational and financial support.  (717) 858-9281 ? [place] DSS Where to apply for food stamps, Medicaid and utility assistance. 212-527-1458 ? RCATS: Transportation to medical appointments. 310-067-2112 ? Social Security Administration: May apply for disability if have a Stage IV cancer. 906-620-5677 617-274-2047 ? [employer], Disability and Transit Services: Assists with nutrition, care and transit needs. 757-451-2395

## 2019-11-14 NOTE — Progress Notes (Signed)
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 Bulky 5.4 cm right supraclavicular region malignant lymph node conglomeration with extracapsular extension. 2. Surrounding smaller abnormal right level 3 and level 5 lymph nodes, and the lymphadenopathy continues into the superior mediastinum, see Chest CT findings reported separately. 3. No other metastatic disease identified in the neck.   01/16/2017 Imaging   CT chest: IMPRESSION: 1. Extensive lymphadenopathy in the thorax and lower right cervical region, as discussed above. Primary differential considerations include lymphoma/leukemia or small cell carcinoma of the lung. Further evaluation a PET-CT could be considered to assess for additional sites of disease below the diaphragm if clinically appropriate. Additionally, ultrasound-guided biopsy of supraclavicular lymphadenopathy could be considered to establish a tissue diagnosis. 2. Indeterminate lesion in the periphery of segment 8 of the liver measuring 2.7 x 1.7 cm. Attention at time of follow-up PET-CT is recommended. 3. Aortic atherosclerosis, in addition to left main and 3 vessel coronary artery disease. Please note that although the presence of coronary artery calcium documents the presence of coronary artery disease, the severity of this disease and any potential stenosis cannot be assessed on this non-gated CT examination. Assessment for potential risk factor modification, dietary therapy or pharmacologic therapy may be warranted, if clinically indicated. 4. There are calcifications of the aortic valve.  Echocardiographic correlation for evaluation of potential valvular dysfunction may be warranted if clinically indicated. 5. Diffuse bronchial wall thickening with moderate centrilobular and paraseptal emphysema; imaging findings suggestive of underlying COPD.   02/03/2017 Initial Biopsy   (R) neck lymph node biopsy: SMALL CELL CARCINOMA (most likely lung primary).    02/03/2017 Miscellaneous   Port-a-cath attempted by IR; unable to place d/t enlarged SVC.    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 Staging for metastatic disease  EXAM: MRI HEAD WITHOUT AND WITH CONTRAST  TECHNIQUE: Multiplanar, multiecho pulse sequences of the brain and surrounding structures were obtained without and with intravenous contrast.  CONTRAST:  57m MULTIHANCE GADOBENATE DIMEGLUMINE 529 MG/ML IV SOLN  COMPARISON:  None.  FINDINGS: Brain: Negative for hydrocephalus. Cerebral volume normal for age. Small nonenhancing white matter hyperintensities consistent with mild chronic microvascular ischemia. No acute infarct. Negative for hemorrhage or mass or edema  Normal enhancement postcontrast infusion. No enhancing mass lesion. Leptomeningeal enhancement is normal.  Vascular: Normal arterial flow voids.  Normal venous enhancement  Skull and upper cervical spine: Negative  Sinuses/Orbits: Negative  Other: None  IMPRESSION: Negative for metastatic disease.  No acute abnormality.  Mild chronic white matter changes.   04/07/2017 Imaging    PET:  1. Marked reduction in size and metabolic activity of bulky RIGHT supraclavicular adenopathy mediastinal lymphadenopathy. 2. Residual moderate activity remains within small RIGHT supraclavicular lymph node, RIGHT lower paratracheal  lymph node and RIGHT hilar lymph node. 3. Resolution of prevascular and internal mammary mediastinal metastatic hypermetabolic activity. 4. Resolution of metabolic activity associated with solitary RIGHT  hepatic lobe liver metastasis. 5. No evidence of disease progression. 6. 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   05/27/2017 Imaging   MRI brain w/ and w/o contrast IMPRESSION: 1. No metastatic disease identified. 2. Increased nonspecific cerebral white matter signal changes since August. These are most commonly small vessel disease related. 3. New right maxillary sinusitis. Benign appearing retention cysts in the nasopharynx with trace mastoid effusions.   06/12/2017 Imaging   PET-CT IMPRESSION: 1. There are two new hypermetabolic nodules identified within both lower lobes measuring up to 3.1 cm. The appearance is nonspecific and may be inflammatory/infectious in etiology. Pulmonary metastatic disease cannot be excluded and short-term follow-up imaging in 3 months is advised to reassess these nodules. 2. Stable appearance of mild hypermetabolic activity associated with right paratracheal and right hilar lymph nodes. 3. Decrease in FDG uptake associated with index right supraclavicular lymph node. 4. 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 5. Aortic Atherosclerosis (ICD10-I70.0) and Emphysema (ICD10-J43.9).   08/24/2017 -  Chemotherapy   The patient had pegfilgrastim (NEULASTA ONPRO KIT) injection 6 mg, 6 mg, Subcutaneous, Once, 30 of 31 cycles Administration: 6 mg (08/28/2017), 6 mg (09/18/2017), 6 mg (10/09/2017), 6 mg (11/02/2017), 6 mg (11/20/2017), 6 mg (12/18/2017), 6 mg (01/15/2018), 6 mg (02/12/2018), 6 mg (03/12/2018), 6 mg (04/09/2018), 6 mg (05/07/2018), 6 mg (06/04/2018), 6 mg  (07/02/2018), 6 mg (07/30/2018), 6 mg (08/27/2018), 6 mg (09/24/2018), 6 mg (10/29/2018), 6 mg (11/26/2018), 6 mg (12/24/2018), 6 mg (01/21/2019), 6 mg (02/18/2019), 6 mg (03/18/2019), 6 mg (04/15/2019), 6 mg (05/13/2019), 6 mg (06/17/2019), 6 mg (07/22/2019), 6 mg (08/19/2019), 6 mg (09/16/2019), 6 mg (10/21/2019) topotecan (HYCAMTIN) 2.9 mg in sodium chloride 0.9 % 100 mL chemo infusion, 1.5 mg/m2 = 2.9 mg, Intravenous,  Once, 30 of 31 cycles Dose modification: 1.2 mg/m2 (80 % of original dose 1.5 mg/m2, Cycle 4, Reason: Dose Not Tolerated) Administration: 2.9 mg (08/24/2017), 2.9 mg (08/25/2017), 2.9 mg (08/28/2017), 2.9 mg (08/26/2017), 2.9 mg (08/27/2017), 2.9 mg (09/14/2017), 2.9 mg (09/15/2017), 2.9 mg (09/16/2017), 2.9 mg (09/17/2017), 2.9 mg (09/18/2017), 2.9 mg (10/05/2017), 2.9 mg (10/06/2017), 2.9 mg (10/07/2017), 2.9 mg (10/08/2017), 2.9 mg (10/09/2017), 2.3 mg (10/26/2017), 2.3 mg (10/27/2017), 2.3 mg (10/28/2017), 2.3 mg (10/29/2017), 2.3 mg (11/02/2017), 2.3 mg (11/16/2017), 2.3 mg (11/17/2017), 2.3 mg (11/18/2017), 2.3 mg (11/19/2017), 2.3 mg (11/20/2017), 2.3 mg (12/14/2017), 2.3 mg (12/15/2017), 2.3 mg (12/16/2017), 2.3 mg (12/17/2017), 2.3 mg (12/18/2017), 2.3 mg (01/11/2018), 2.3 mg (01/12/2018), 2.3 mg (01/13/2018), 2.3 mg (01/15/2018), 2.3 mg (02/08/2018), 2.3 mg (02/09/2018), 2.3 mg (02/10/2018), 2.3 mg (02/11/2018), 2.3 mg (02/12/2018), 2.3 mg (03/08/2018), 2.3 mg (03/09/2018), 2.3 mg (03/10/2018), 2.3 mg (03/11/2018), 2.3 mg (03/12/2018), 2.2 mg (04/05/2018), 2.2 mg (04/06/2018), 2.2 mg (04/07/2018), 2.2 mg (04/08/2018), 2.2 mg (04/09/2018), 2.2 mg (05/03/2018), 2.2 mg (05/04/2018), 2.2 mg (05/05/2018), 2.2 mg (05/06/2018), 2.2 mg (05/07/2018), 2.2 mg (05/31/2018), 2.2 mg (06/01/2018), 2.2 mg (06/02/2018), 2.2 mg (06/03/2018), 2.2 mg (06/04/2018), 2.2 mg (06/28/2018), 2.2 mg (06/29/2018), 2.2 mg (06/30/2018), 2.2 mg (07/01/2018), 2.2 mg (07/02/2018), 2.2 mg (07/26/2018), 2.2 mg (07/27/2018), 2.2 mg (07/28/2018), 2.2 mg (07/29/2018), 2.2 mg (07/30/2018), 2.2 mg (08/23/2018), 2.2 mg  (08/24/2018), 2.2 mg (08/25/2018), 2.2 mg (08/26/2018), 2.2 mg (08/27/2018), 2.2 mg (09/20/2018), 2.2 mg (09/21/2018), 2.2 mg (09/22/2018), 2.2 mg (09/23/2018), 2.2 mg (09/24/2018), 2.2 mg (10/25/2018), 2.2 mg (10/26/2018), 2.2 mg (10/27/2018), 2.2 mg (10/28/2018),  2.2 mg (10/29/2018), 2.2 mg (11/22/2018), 2.2 mg (11/23/2018), 2.2 mg (11/24/2018), 2.2 mg (11/25/2018), 2.2 mg (11/26/2018), 2.2 mg (12/20/2018), 2.2 mg (12/21/2018), 2.2 mg (12/22/2018), 2.2 mg (12/23/2018), 2.2 mg (12/24/2018), 2.2 mg (01/17/2019), 2.2 mg (01/18/2019), 2.2 mg (01/19/2019), 2.2 mg (01/20/2019), 2.2 mg (01/21/2019), 2.2 mg (02/14/2019), 2.2 mg (02/15/2019), 2.2 mg (02/16/2019), 2.2 mg (02/17/2019), 2.2 mg (02/18/2019), 2.2 mg (03/14/2019), 2.2 mg (03/15/2019), 2.2 mg (03/16/2019), 2.2 mg (03/17/2019), 2.2 mg (03/18/2019), 2.2 mg (04/11/2019), 2.2 mg (04/12/2019), 2.2 mg (04/13/2019), 2.2 mg (04/14/2019), 2.2 mg (04/15/2019), 2.2 mg (05/09/2019), 2.2 mg (05/10/2019), 2.2 mg (05/11/2019), 2.2 mg (05/12/2019), 2.2 mg (05/13/2019), 2.2 mg (06/13/2019), 2.2 mg (06/14/2019), 2.2 mg (06/15/2019), 2.2 mg (06/16/2019), 2.2 mg (06/17/2019), 2.2 mg (07/18/2019), 2.2 mg (07/19/2019), 2.2 mg (07/20/2019), 2.2 mg (07/21/2019), 2.2 mg (07/22/2019), 2.2 mg (08/15/2019), 2.2 mg (08/16/2019), 2.2 mg (08/17/2019), 2.2 mg (08/18/2019), 2.2 mg (08/19/2019), 2.2 mg (09/12/2019), 2.2 mg (09/13/2019), 2.2 mg (09/14/2019), 2.2 mg (09/15/2019), 2.2 mg (09/16/2019), 2.2 mg (10/17/2019), 2.2 mg (10/18/2019), 2.2 mg (10/19/2019), 2.2 mg (10/20/2019), 2.2 mg (10/21/2019), 2.2 mg (11/14/2019) ondansetron (ZOFRAN) 4 mg in sodium chloride 0.9 % 50 mL IVPB, , Intravenous,  Once, 25 of 26 cycles Administration:  (02/08/2018),  (02/09/2018),  (02/10/2018),  (02/11/2018),  (02/12/2018),  (03/08/2018),  (03/09/2018),  (03/10/2018),  (03/11/2018),  (03/12/2018),  (04/05/2018),  (04/06/2018),  (04/07/2018),  (04/08/2018),  (04/09/2018),  (05/03/2018),  (05/04/2018),  (05/05/2018),  (05/06/2018),  (05/07/2018),  (05/31/2018),  (06/01/2018),  (06/02/2018),  (06/03/2018),  (06/04/2018),   (06/28/2018),  (06/29/2018),  (06/30/2018),  (07/01/2018),  (07/02/2018),  (07/26/2018),  (07/27/2018),  (07/28/2018),  (07/29/2018),  (07/30/2018),  (08/23/2018),  (08/24/2018),  (08/25/2018),  (08/26/2018),  (08/27/2018),  (09/20/2018),  (09/21/2018),  (09/22/2018),  (09/23/2018),  (09/24/2018),  (10/25/2018),  (10/26/2018),  (10/27/2018),  (10/28/2018),  (10/29/2018),  (11/22/2018),  (11/23/2018),  (11/24/2018),  (11/25/2018),  (11/26/2018),  (12/20/2018),  (12/21/2018),  (12/22/2018),  (12/23/2018),  (12/24/2018),  (01/17/2019),  (01/18/2019),  (01/19/2019),  (01/20/2019),  (01/21/2019),  (02/14/2019),  (02/15/2019),  (02/16/2019),  (02/17/2019),  (02/18/2019),  (03/14/2019),  (03/15/2019),  (03/16/2019),  (03/17/2019),  (03/18/2019),  (04/11/2019),  (04/12/2019),  (04/13/2019),  (04/14/2019),  (04/15/2019),  (05/09/2019),  (05/10/2019),  (05/11/2019),  (05/12/2019),  (05/13/2019),  (06/13/2019),  (06/14/2019),  (06/15/2019),  (06/16/2019),  (06/17/2019),  (07/18/2019),  (07/19/2019),  (07/20/2019),  (07/21/2019),  (07/22/2019),  (08/15/2019),  (08/16/2019),  (08/17/2019),  (08/18/2019),  (08/19/2019),  (09/12/2019),  (09/13/2019),  (09/14/2019),  (09/15/2019),  (09/16/2019),  (10/17/2019),  (10/18/2019),  (10/19/2019),  (10/20/2019),  (10/21/2019),  (11/14/2019)  for chemotherapy treatment.         INTERVAL HISTORY:  Mr. Briski 69 y.o. male seen for follow-up and toxicity assessment prior to next cycle of chemotherapy.  Reports appetite 25%.  Energy levels are 75%.  Right-sided neck soreness was reported and stable.  Numbness in the feet has been stable.  Has constipation which is managed reasonably.  Occasional sleep problems are present.  REVIEW OF SYSTEMS:  Review of Systems  Gastrointestinal: Positive for constipation.  Neurological: Positive for numbness.  Psychiatric/Behavioral: Positive for sleep disturbance.  All other systems reviewed and are negative.    PAST MEDICAL/SURGICAL HISTORY:  Past Medical History:  Diagnosis Date  . Anxiety   . CAD (coronary artery disease)   . Cancer ([place])      stage 4 small cell lung cancer   . COPD (chronic obstructive pulmonary disease) ([place])   . Depression   . Dyspnea    increased exertion  . Feeling of chest tightness   .  Heart palpitations   . History of chemotherapy   . Myocardial infarction ([place])   . Osteopenia   . Panic attacks   . Smoker    Past Surgical History:  Procedure Laterality Date  . BACK SURGERY  12/24/2000   L5,S1  . CORONARY STENT PLACEMENT  2005   RCA & CX  . HERNIA REPAIR Right 1980's  . INGUINAL HERNIA REPAIR  12/1978   right side  . IR FLUORO GUIDE PORT INSERTION RIGHT  04/02/2017  . IR US GUIDE BX ASP/DRAIN  02/03/2017  . IR US GUIDE VASC ACCESS RIGHT  04/02/2017  . NM MYOCAR PERF WALL MOTION  09/07/2009   No ischemia; EF 51%  . SHOULDER SURGERY Left 08/2010  . SPINE SURGERY  2002   L5-S1     SOCIAL HISTORY:  Social History   Socioeconomic History  . Marital status: Single    Spouse name: Not on file  . Number of children: Not on file  . Years of education: Not on file  . Highest education level: Not on file  Occupational History  . Not on file  Tobacco Use  . Smoking status: Current Every Day Smoker    Packs/day: 1.00    Types: Cigarettes  . Smokeless tobacco: Never Used  Substance and Sexual Activity  . Alcohol use: Yes    Comment: occas  . Drug use: No  . Sexual activity: Not on file  Other Topics Concern  . Not on file  Social History Narrative  . Not on file   Social Determinants of Health   Financial Resource Strain:   . Difficulty of Paying Living Expenses:   Food Insecurity:   . Worried About Charity fundraiser in the Last Year:   . Arboriculturist in the Last Year:   Transportation Needs:   . Film/video editor (Medical):   Marland Kitchen Lack of Transportation (Non-Medical):   Physical Activity:   . Days of Exercise per Week:   . Minutes of Exercise per Session:   Stress:   . Feeling of Stress :   Social Connections:   . Frequency of Communication with Friends and Family:    . Frequency of Social Gatherings with Friends and Family:   . Attends Religious Services:   . Active Member of Clubs or Organizations:   . Attends Archivist Meetings:   Marland Kitchen Marital Status:   Intimate Partner Violence:   . Fear of Current or Ex-Partner:   . Emotionally Abused:   Marland Kitchen Physically Abused:   . Sexually Abused:     FAMILY HISTORY:  Family History  Problem Relation Age of Onset  . Heart attack Father   . Kidney disease Father        renal failure  . Heart failure Mother   . Heart attack Mother   . Cancer Brother   . Diabetes Brother   . Alcohol abuse Brother   . Diabetes Sister     CURRENT MEDICATIONS:  Outpatient Encounter Medications as of 11/14/2019  Medication Sig  . ALPRAZolam (XANAX) 1 MG tablet TAKE (1) TABLET BY MOUTH (4) TIMES DAILY.  Marland Kitchen Ascorbic Acid (VITAMIN C) 1000 MG tablet Take 1,000 mg by mouth daily.  Marland Kitchen aspirin EC 81 MG tablet Take 81 mg by mouth daily.  Marland Kitchen atorvastatin (LIPITOR) 40 MG tablet TAKE (1) TABLET BY MOUTH ONCE DAILY.  Marland Kitchen Bioflavonoid Products (ESTER C PO) Take 1 tablet by mouth daily.  . calcium-vitamin D (OSCAL WITH  D) 250-125 MG-UNIT tablet Take 1 tablet by mouth daily.  . cholecalciferol (VITAMIN D3) 25 MCG (1000 UT) tablet Take 1,000 Units by mouth daily.  . clotrimazole-betamethasone (LOTRISONE) cream Apply 1 application topically 2 (two) times daily.  Marland Kitchen gabapentin (NEURONTIN) 300 MG capsule Take 2 capsules (600 mg total) by mouth 3 (three) times daily.  . hydrOXYzine (ATARAX/VISTARIL) 50 MG tablet TAKE ONE TABLET BY MOUTH AT BEDTIME AS NEEDED FOR SLEEP.  . Melatonin 10 MG TABS Take 1 capsule by mouth every evening.  . Multiple Vitamins-Minerals (CENTRUM SILVER 50+MEN) TABS Take 1 tablet by mouth daily.  Marland Kitchen oxyCODONE-acetaminophen (PERCOCET/ROXICET) 5-325 MG tablet TAKE 1 TABLET EVERY 8 HOURS AS NEEDED FOR SEVERE PAIN.  Marland Kitchen Pegfilgrastim (NEULASTA ONPRO [place]) Inject into the skin. Every 21 days  . senna (SENOKOT) 8.6 MG tablet Take 1  tablet by mouth daily.  . topotecan in sodium chloride 0.9 % 100 mL Inject into the vein once. Days 1-5 every 21 days  . vitamin B-12 (CYANOCOBALAMIN) 100 MCG tablet Take 100 mcg by mouth daily.  Marland Kitchen albuterol (VENTOLIN HFA) 108 (90 Base) MCG/ACT inhaler Inhale 2 puffs into the lungs every 6 (six) hours as needed for wheezing or shortness of breath.  . ondansetron (ZOFRAN) 8 MG tablet Take 1 tablet (8 mg total) by mouth 2 (two) times daily as needed. Start on the third day after cisplatin chemotherapy. (Patient not taking: Reported on 11/14/2019)  . polyethylene glycol powder (GLYCOLAX/MIRALAX) powder Take 17 g by mouth daily. (Patient not taking: Reported on 11/14/2019)  . prochlorperazine (COMPAZINE) 10 MG tablet Take 1 tablet (10 mg total) by mouth every 6 (six) hours as needed (Nausea or vomiting). (Patient not taking: Reported on 11/14/2019)   No facility-administered encounter medications on file as of 11/14/2019.    ALLERGIES:  Allergies  Allergen Reactions  . Codeine Nausea Only  . Niaspan [Niacin Er]      PHYSICAL EXAM:  ECOG Performance status: 1  Vitals:   11/14/19 0928  BP: (!) 111/47  Pulse: 61  Resp: 18  Temp: (!) 97.5 F (36.4 C)  SpO2: 98%   Filed Weights   11/14/19 0928  Weight: 152 lb (68.9 kg)    Physical Exam Vitals reviewed.  Constitutional:      Appearance: Normal appearance.  Cardiovascular:     Rate and Rhythm: Normal rate and regular rhythm.     Heart sounds: Normal heart sounds.  Pulmonary:     Effort: Pulmonary effort is normal.     Breath sounds: Normal breath sounds.  Abdominal:     General: There is no distension.     Palpations: Abdomen is soft. There is no mass.  Musculoskeletal:        General: No swelling.  Skin:    General: Skin is warm.  Neurological:     General: No focal deficit present.     Mental Status: He is alert and oriented to person, place, and time.  Psychiatric:        Mood and Affect: Mood normal.        Behavior:  Behavior normal.      LABORATORY DATA:  I have reviewed the labs as listed.  CBC    Component Value Date/Time   WBC 6.6 11/14/2019 0933   RBC 2.73 (L) 11/14/2019 0933   HGB 8.8 (L) 11/14/2019 0933   HCT 27.8 (L) 11/14/2019 0933   PLT 382 11/14/2019 0933   MCV 101.8 (H) 11/14/2019 0933   MCH 32.2 11/14/2019  0933   MCHC 31.7 11/14/2019 0933   RDW 23.4 (H) 11/14/2019 0933   LYMPHSABS 1.4 11/14/2019 0933   MONOABS 0.8 11/14/2019 0933   EOSABS 0.1 11/14/2019 0933   BASOSABS 0.0 11/14/2019 0933   CMP Latest Ref Rng & Units 11/14/2019 10/17/2019 09/12/2019  Glucose 70 - 99 mg/dL 105(H) 98 92  BUN 8 - 23 mg/dL _0 Creatinine 0.61 - 1.24 mg/dL 0.93 0.86 0.91  Sodium 135 - 145 mmol/L 135 137 137  Potassium 3.5 - 5.1 mmol/L 4.2 4.1 4.0  Chloride 98 - 111 mmol/L 104 105 104  CO2 22 - 32 mmol/L _1 Calcium 8.9 - 10.3 mg/dL 8.6(L) 8.8(L) 8.5(L)  Total Protein 6.5 - 8.1 g/dL 6.7 6.3(L) 6.4(L)  Total Bilirubin 0.3 - 1.2 mg/dL 0.7 0.6 0.5  Alkaline Phos 38 - 126 U/L 56 53 58  AST 15 - 41 U/L 13(L) 17 17  ALT 0 - 44 U/L _2 I have reviewed scans with the patient.   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 -CT CAP on 09/07/2019 showed stable small bilateral lung nodules, 8 mm largest in the left lung base with no evidence of metastatic disease in the abdomen or pelvis. -MRI of the brain on 09/07/2019 did not show any metastatic disease. -Lymphadenopathy in the right neck region is stable. -I reviewed his labs today are adequate to proceed with next cycle of chemotherapy.  I will see him back in 4 weeks.  I plan to repeat CT neck, chest, abdomen and pelvis prior to next visit in 4 weeks.  2.  Normocytic anemia: -Chemotherapy-induced with relative iron deficiency.  He received Feraheme on  10/17/2019. -Hemoglobin today is 8.8.  3.  Chronic pain: -He is taking Percocet 1 tablet daily.  If he takes more, he will get constipated.  4.  Peripheral neuropathy: -Continue gabapentin 600 mg 3 times a day.     Orders placed this encounter:  Orders Placed This Encounter  Procedures  . CT Abdomen Pelvis W Contrast  . CT Chest W Contrast  . CT SOFT TISSUE NECK W CONTRAST  . Iron and TIBC  . Ferritin  . Vitamin B12  . Folate  . CBC with Differential/Platelet  . Comprehensive metabolic panel  . Magnesium      Derek Jack, MD [place] [phone]

## 2019-11-15 ENCOUNTER — Inpatient Hospital Stay (HOSPITAL_COMMUNITY): Payer: Medicare HMO

## 2019-11-15 ENCOUNTER — Encounter (HOSPITAL_COMMUNITY): Payer: Self-pay

## 2019-11-15 VITALS — BP 106/42 | HR 55 | Temp 97.7°F | Resp 18

## 2019-11-15 DIAGNOSIS — D6481 Anemia due to antineoplastic chemotherapy: Secondary | ICD-10-CM | POA: Diagnosis not present

## 2019-11-15 DIAGNOSIS — G8929 Other chronic pain: Secondary | ICD-10-CM | POA: Diagnosis not present

## 2019-11-15 DIAGNOSIS — Z79899 Other long term (current) drug therapy: Secondary | ICD-10-CM | POA: Diagnosis not present

## 2019-11-15 DIAGNOSIS — C3490 Malignant neoplasm of unspecified part of unspecified bronchus or lung: Secondary | ICD-10-CM

## 2019-11-15 DIAGNOSIS — C770 Secondary and unspecified malignant neoplasm of lymph nodes of head, face and neck: Secondary | ICD-10-CM | POA: Diagnosis not present

## 2019-11-15 DIAGNOSIS — M8580 Other specified disorders of bone density and structure, unspecified site: Secondary | ICD-10-CM | POA: Diagnosis not present

## 2019-11-15 DIAGNOSIS — C787 Secondary malignant neoplasm of liver and intrahepatic bile duct: Secondary | ICD-10-CM | POA: Diagnosis not present

## 2019-11-15 DIAGNOSIS — Z5111 Encounter for antineoplastic chemotherapy: Secondary | ICD-10-CM | POA: Diagnosis not present

## 2019-11-15 MED ORDER — SODIUM CHLORIDE 0.9% FLUSH
10.0000 mL | INTRAVENOUS | Status: DC | PRN
  Administered 2019-11-15: 14:00:00 10 mL

## 2019-11-15 MED ORDER — SODIUM CHLORIDE 0.9 % IV SOLN
Freq: Once | INTRAVENOUS | Status: AC
  Filled 2019-11-15: qty 2

## 2019-11-15 MED ORDER — HEPARIN SOD (PORK) LOCK FLUSH 100 UNIT/ML IV SOLN
500.0000 [IU] | Freq: Once | INTRAVENOUS | Status: AC | PRN
  Administered 2019-11-15: 500 [IU]

## 2019-11-15 MED ORDER — TOPOTECAN HCL CHEMO INJECTION 4 MG
1.2000 mg/m2 | Freq: Once | INTRAVENOUS | Status: AC
  Administered 2019-11-15: 15:00:00 2.2 mg via INTRAVENOUS
  Filled 2019-11-15: qty 2.2

## 2019-11-15 MED ORDER — SODIUM CHLORIDE 0.9 % IV SOLN: Freq: Once | INTRAVENOUS | Status: AC

## 2019-11-15 NOTE — Patient Instructions (Signed)
[facility] Discharge Instructions for Patients Receiving Chemotherapy   Beginning January 23rd 2017 lab work for the [facility] will be done in the  Main lab at [facility] on 1st floor. If you have a lab appointment with the [facility] please come in thru the  Main Entrance and check in at the main information desk   Today you received the following chemotherapy agents Topotecan. Follow-up as scheduled. Call clinic for any questions or concerns  To help prevent nausea and vomiting after your treatment, we encourage you to take your nausea medication   If you develop nausea and vomiting, or diarrhea that is not controlled by your medication, call the clinic.  The clinic phone number is [phone]. Office hours are Monday-Friday 8:30am-5:00pm.  BELOW ARE SYMPTOMS THAT SHOULD BE REPORTED IMMEDIATELY:  *FEVER GREATER THAN 101.0 F  *CHILLS WITH OR WITHOUT FEVER  NAUSEA AND VOMITING THAT IS NOT CONTROLLED WITH YOUR NAUSEA MEDICATION  *UNUSUAL SHORTNESS OF BREATH  *UNUSUAL BRUISING OR BLEEDING  TENDERNESS IN MOUTH AND THROAT WITH OR WITHOUT PRESENCE OF ULCERS  *URINARY PROBLEMS  *BOWEL PROBLEMS  UNUSUAL RASH Items with * indicate a potential emergency and should be followed up as soon as possible. If you have an emergency after office hours please contact your primary care physician or go to the nearest emergency department.  Please call the clinic during office hours if you have any questions or concerns.   You may also contact the Patient Navigator at [phone] should you have any questions or need assistance in obtaining follow up care.      Resources For Cancer Patients and their Caregivers ? American Cancer Society: Can assist with transportation, wigs, general needs, runs Look Good Feel Better.        860-564-7235 ? Cancer Care: Provides financial assistance, online support groups, medication/co-pay assistance.  1-800-813-HOPE  (484)370-3873) ? [place] Assists [place] cancer patients and their families through emotional , educational and financial support.  407-253-7855 ? [place] DSS Where to apply for food stamps, Medicaid and utility assistance. 548-003-9200 ? [facility]: Transportation to medical appointments. 207-600-7545 ? Social Security Administration: May apply for disability if have a Stage IV cancer. [phone] ? [employer], Disability and Transit Services: Assists with nutrition, care and transit needs. [phone]

## 2019-11-15 NOTE — Progress Notes (Signed)
[name] tolerated Topotecan infusion well without complaints or incident. Port left accessed and flushed for use tomorrow. VSS upon discharge. Pt discharged self ambulatory in satisfactory condition

## 2019-11-16 ENCOUNTER — Other Ambulatory Visit: Payer: Self-pay

## 2019-11-16 ENCOUNTER — Inpatient Hospital Stay (HOSPITAL_COMMUNITY): Payer: Medicare HMO

## 2019-11-16 VITALS — BP 108/40 | HR 57 | Temp 96.9°F | Resp 17

## 2019-11-16 DIAGNOSIS — C3490 Malignant neoplasm of unspecified part of unspecified bronchus or lung: Secondary | ICD-10-CM

## 2019-11-16 DIAGNOSIS — D6481 Anemia due to antineoplastic chemotherapy: Secondary | ICD-10-CM | POA: Diagnosis not present

## 2019-11-16 DIAGNOSIS — Z5111 Encounter for antineoplastic chemotherapy: Secondary | ICD-10-CM | POA: Diagnosis not present

## 2019-11-16 DIAGNOSIS — C770 Secondary and unspecified malignant neoplasm of lymph nodes of head, face and neck: Secondary | ICD-10-CM | POA: Diagnosis not present

## 2019-11-16 DIAGNOSIS — G8929 Other chronic pain: Secondary | ICD-10-CM | POA: Diagnosis not present

## 2019-11-16 DIAGNOSIS — Z79899 Other long term (current) drug therapy: Secondary | ICD-10-CM | POA: Diagnosis not present

## 2019-11-16 DIAGNOSIS — M8580 Other specified disorders of bone density and structure, unspecified site: Secondary | ICD-10-CM | POA: Diagnosis not present

## 2019-11-16 DIAGNOSIS — C787 Secondary malignant neoplasm of liver and intrahepatic bile duct: Secondary | ICD-10-CM | POA: Diagnosis not present

## 2019-11-16 MED ORDER — SODIUM CHLORIDE 0.9 % IV SOLN
Freq: Once | INTRAVENOUS | Status: AC
  Filled 2019-11-16: qty 2

## 2019-11-16 MED ORDER — SODIUM CHLORIDE 0.9% FLUSH
10.0000 mL | INTRAVENOUS | Status: DC | PRN
  Administered 2019-11-16: 14:00:00 10 mL

## 2019-11-16 MED ORDER — SODIUM CHLORIDE 0.9 % IV SOLN: Freq: Once | INTRAVENOUS | Status: AC

## 2019-11-16 MED ORDER — HEPARIN SOD (PORK) LOCK FLUSH 100 UNIT/ML IV SOLN
500.0000 [IU] | Freq: Once | INTRAVENOUS | Status: AC | PRN
  Administered 2019-11-16: 500 [IU]

## 2019-11-16 MED ORDER — TOPOTECAN HCL CHEMO INJECTION 4 MG
1.2000 mg/m2 | Freq: Once | INTRAVENOUS | Status: AC
  Administered 2019-11-16: 2.2 mg via INTRAVENOUS
  Filled 2019-11-16: qty 2.2

## 2019-11-16 NOTE — Progress Notes (Signed)
Treatment given per orders. Patient tolerated it well without problems. Vitals stable and discharged home from clinic ambulatory. Follow up as scheduled.  

## 2019-11-16 NOTE — Patient Instructions (Signed)
[facility] Discharge Instructions for Patients Receiving Chemotherapy  Today you received the following chemotherapy agents   To help prevent nausea and vomiting after your treatment, we encourage you to take your nausea medication   If you develop nausea and vomiting that is not controlled by your nausea medication, call the clinic.   BELOW ARE SYMPTOMS THAT SHOULD BE REPORTED IMMEDIATELY:  *FEVER GREATER THAN 100.5 F  *CHILLS WITH OR WITHOUT FEVER  NAUSEA AND VOMITING THAT IS NOT CONTROLLED WITH YOUR NAUSEA MEDICATION  *UNUSUAL SHORTNESS OF BREATH  *UNUSUAL BRUISING OR BLEEDING  TENDERNESS IN MOUTH AND THROAT WITH OR WITHOUT PRESENCE OF ULCERS  *URINARY PROBLEMS  *BOWEL PROBLEMS  UNUSUAL RASH Items with * indicate a potential emergency and should be followed up as soon as possible.  Feel free to call the clinic should you have any questions or concerns. The clinic phone number is [phone].  Please show the CHEMO ALERT CARD at check-in to the Emergency Department and triage nurse.   

## 2019-11-17 ENCOUNTER — Encounter (HOSPITAL_COMMUNITY): Payer: Self-pay

## 2019-11-17 ENCOUNTER — Inpatient Hospital Stay (HOSPITAL_COMMUNITY): Payer: Medicare HMO

## 2019-11-17 VITALS — BP 120/46 | HR 52 | Temp 97.1°F | Resp 18

## 2019-11-17 DIAGNOSIS — M8580 Other specified disorders of bone density and structure, unspecified site: Secondary | ICD-10-CM | POA: Diagnosis not present

## 2019-11-17 DIAGNOSIS — Z5111 Encounter for antineoplastic chemotherapy: Secondary | ICD-10-CM | POA: Diagnosis not present

## 2019-11-17 DIAGNOSIS — C770 Secondary and unspecified malignant neoplasm of lymph nodes of head, face and neck: Secondary | ICD-10-CM | POA: Diagnosis not present

## 2019-11-17 DIAGNOSIS — D6481 Anemia due to antineoplastic chemotherapy: Secondary | ICD-10-CM | POA: Diagnosis not present

## 2019-11-17 DIAGNOSIS — C3490 Malignant neoplasm of unspecified part of unspecified bronchus or lung: Secondary | ICD-10-CM

## 2019-11-17 DIAGNOSIS — Z79899 Other long term (current) drug therapy: Secondary | ICD-10-CM | POA: Diagnosis not present

## 2019-11-17 DIAGNOSIS — C787 Secondary malignant neoplasm of liver and intrahepatic bile duct: Secondary | ICD-10-CM | POA: Diagnosis not present

## 2019-11-17 DIAGNOSIS — G8929 Other chronic pain: Secondary | ICD-10-CM | POA: Diagnosis not present

## 2019-11-17 MED ORDER — SODIUM CHLORIDE 0.9 % IV SOLN: Freq: Once | INTRAVENOUS | Status: AC

## 2019-11-17 MED ORDER — TOPOTECAN HCL CHEMO INJECTION 4 MG
1.2000 mg/m2 | Freq: Once | INTRAVENOUS | Status: AC
  Administered 2019-11-17: 2.2 mg via INTRAVENOUS
  Filled 2019-11-17: qty 2.2

## 2019-11-17 MED ORDER — SODIUM CHLORIDE 0.9% FLUSH
10.0000 mL | INTRAVENOUS | Status: DC | PRN
  Administered 2019-11-17: 10 mL

## 2019-11-17 MED ORDER — HEPARIN SOD (PORK) LOCK FLUSH 100 UNIT/ML IV SOLN
500.0000 [IU] | Freq: Once | INTRAVENOUS | Status: AC | PRN
  Administered 2019-11-17: 500 [IU]

## 2019-11-17 MED ORDER — SODIUM CHLORIDE 0.9 % IV SOLN
Freq: Once | INTRAVENOUS | Status: AC
  Filled 2019-11-17: qty 2

## 2019-11-17 NOTE — Progress Notes (Signed)
Patient tolerated chemotherapy with no complaints voiced.  Side effects with management reviewed with understanding verbalized.  Port site clean and dry with no bruising or swelling noted at site.  Good blood return noted before and after administration of chemotherapy.  Dressing intact.  Patient left ambulatory with VSS and no s/s of distress noted.

## 2019-11-18 ENCOUNTER — Other Ambulatory Visit: Payer: Self-pay

## 2019-11-18 ENCOUNTER — Inpatient Hospital Stay (HOSPITAL_COMMUNITY): Payer: Medicare HMO

## 2019-11-18 VITALS — BP 119/51 | HR 56 | Temp 97.1°F | Resp 18

## 2019-11-18 DIAGNOSIS — C770 Secondary and unspecified malignant neoplasm of lymph nodes of head, face and neck: Secondary | ICD-10-CM | POA: Diagnosis not present

## 2019-11-18 DIAGNOSIS — Z79899 Other long term (current) drug therapy: Secondary | ICD-10-CM | POA: Diagnosis not present

## 2019-11-18 DIAGNOSIS — C3490 Malignant neoplasm of unspecified part of unspecified bronchus or lung: Secondary | ICD-10-CM | POA: Diagnosis not present

## 2019-11-18 DIAGNOSIS — D6481 Anemia due to antineoplastic chemotherapy: Secondary | ICD-10-CM | POA: Diagnosis not present

## 2019-11-18 DIAGNOSIS — C787 Secondary malignant neoplasm of liver and intrahepatic bile duct: Secondary | ICD-10-CM | POA: Diagnosis not present

## 2019-11-18 DIAGNOSIS — G8929 Other chronic pain: Secondary | ICD-10-CM | POA: Diagnosis not present

## 2019-11-18 DIAGNOSIS — M8580 Other specified disorders of bone density and structure, unspecified site: Secondary | ICD-10-CM | POA: Diagnosis not present

## 2019-11-18 DIAGNOSIS — Z5111 Encounter for antineoplastic chemotherapy: Secondary | ICD-10-CM | POA: Diagnosis not present

## 2019-11-18 MED ORDER — HEPARIN SOD (PORK) LOCK FLUSH 100 UNIT/ML IV SOLN
500.0000 [IU] | Freq: Once | INTRAVENOUS | Status: AC | PRN
  Administered 2019-11-18: 500 [IU]

## 2019-11-18 MED ORDER — TOPOTECAN HCL CHEMO INJECTION 4 MG
1.2000 mg/m2 | Freq: Once | INTRAVENOUS | Status: AC
  Administered 2019-11-18: 2.2 mg via INTRAVENOUS
  Filled 2019-11-18: qty 2.2

## 2019-11-18 MED ORDER — SODIUM CHLORIDE 0.9% FLUSH
10.0000 mL | INTRAVENOUS | Status: DC | PRN
  Administered 2019-11-18: 10 mL

## 2019-11-18 MED ORDER — SODIUM CHLORIDE 0.9 % IV SOLN: Freq: Once | INTRAVENOUS | Status: AC

## 2019-11-18 MED ORDER — PEGFILGRASTIM 6 MG/0.6ML ~~LOC~~ PSKT
6.0000 mg | PREFILLED_SYRINGE | Freq: Once | SUBCUTANEOUS | Status: AC
  Administered 2019-11-18: 6 mg via SUBCUTANEOUS
  Filled 2019-11-18: qty 0.6

## 2019-11-18 MED ORDER — SODIUM CHLORIDE 0.9 % IV SOLN
Freq: Once | INTRAVENOUS | Status: AC
  Filled 2019-11-18: qty 2

## 2019-11-18 NOTE — Progress Notes (Signed)
Patient tolerated chemotherapy with no complaints voiced.  Side effects with management reviewed with understanding verbalized.  Port site clean and dry with no bruising or swelling noted at site.  Good blood return noted before and after administration of chemotherapy.  Band aid applied. Neulalsta onpro applied to arm with no alarms noted.  Patient left ambulatory with VSS and no s/s of distress noted.

## 2019-12-09 ENCOUNTER — Other Ambulatory Visit: Payer: Self-pay | Admitting: *Deleted

## 2019-12-09 MED ORDER — HYDROXYZINE HCL 50 MG PO TABS: 50.0000 mg | ORAL_TABLET | Freq: Every evening | ORAL | 0 refills | Status: DC | PRN

## 2019-12-15 ENCOUNTER — Other Ambulatory Visit: Payer: Self-pay

## 2019-12-15 ENCOUNTER — Ambulatory Visit (HOSPITAL_COMMUNITY)
Admission: RE | Admit: 2019-12-15 | Discharge: 2019-12-15 | Disposition: A | Discharge status: Home / Self Care | Payer: Medicare HMO | Source: Ambulatory Visit | Attending: Hematology | Admitting: Hematology

## 2019-12-15 DIAGNOSIS — C787 Secondary malignant neoplasm of liver and intrahepatic bile duct: Secondary | ICD-10-CM | POA: Diagnosis not present

## 2019-12-15 DIAGNOSIS — C3490 Malignant neoplasm of unspecified part of unspecified bronchus or lung: Secondary | ICD-10-CM | POA: Diagnosis not present

## 2019-12-15 LAB — POCT I-STAT CREATININE: Creatinine, Ser: 1 mg/dL (ref 0.61–1.24)

## 2019-12-15 MED ORDER — IOHEXOL 300 MG/ML  SOLN
125.0000 mL | Freq: Once | INTRAMUSCULAR | Status: AC | PRN
  Administered 2019-12-15: 125 mL via INTRAVENOUS

## 2019-12-19 ENCOUNTER — Inpatient Hospital Stay (HOSPITAL_COMMUNITY): Payer: Medicare HMO | Admitting: Hematology

## 2019-12-19 ENCOUNTER — Inpatient Hospital Stay (HOSPITAL_COMMUNITY): Payer: Medicare HMO | Attending: Hematology

## 2019-12-19 ENCOUNTER — Other Ambulatory Visit: Payer: Self-pay

## 2019-12-19 ENCOUNTER — Inpatient Hospital Stay (HOSPITAL_COMMUNITY): Payer: Medicare HMO

## 2019-12-19 DIAGNOSIS — C3490 Malignant neoplasm of unspecified part of unspecified bronchus or lung: Secondary | ICD-10-CM

## 2019-12-19 DIAGNOSIS — T451X5A Adverse effect of antineoplastic and immunosuppressive drugs, initial encounter: Secondary | ICD-10-CM | POA: Diagnosis not present

## 2019-12-19 DIAGNOSIS — Z7189 Other specified counseling: Secondary | ICD-10-CM | POA: Diagnosis not present

## 2019-12-19 DIAGNOSIS — C787 Secondary malignant neoplasm of liver and intrahepatic bile duct: Secondary | ICD-10-CM | POA: Insufficient documentation

## 2019-12-19 DIAGNOSIS — Z5111 Encounter for antineoplastic chemotherapy: Secondary | ICD-10-CM | POA: Insufficient documentation

## 2019-12-19 DIAGNOSIS — D649 Anemia, unspecified: Secondary | ICD-10-CM | POA: Insufficient documentation

## 2019-12-19 DIAGNOSIS — G620 Drug-induced polyneuropathy: Secondary | ICD-10-CM

## 2019-12-19 LAB — VITAMIN B12: Vitamin B-12: 1755 pg/mL — ABNORMAL HIGH (ref 180–914)

## 2019-12-19 LAB — COMPREHENSIVE METABOLIC PANEL
ALT: 15 U/L (ref 0–44)
AST: 19 U/L (ref 15–41)
Albumin: 4 g/dL (ref 3.5–5.0)
Alkaline Phosphatase: 52 U/L (ref 38–126)
Anion gap: 8 (ref 5–15)
BUN: 25 mg/dL — ABNORMAL HIGH (ref 8–23)
CO2: 24 mmol/L (ref 22–32)
Calcium: 8.8 mg/dL — ABNORMAL LOW (ref 8.9–10.3)
Chloride: 104 mmol/L (ref 98–111)
Creatinine, Ser: 0.83 mg/dL (ref 0.61–1.24)
GFR calc Af Amer: >60 mL/min (ref >60)
GFR calc non Af Amer: >60 mL/min (ref >60)
Glucose, Bld: 100 mg/dL — ABNORMAL HIGH (ref 70–99)
Potassium: 3.9 mmol/L (ref 3.5–5.1)
Sodium: 136 mmol/L (ref 135–145)
Total Bilirubin: 0.5 mg/dL (ref 0.3–1.2)
Total Protein: 6.6 g/dL (ref 6.5–8.1)

## 2019-12-19 LAB — CBC WITH DIFFERENTIAL/PLATELET
Abs Immature Granulocytes: 0.04 10*3/uL (ref 0.00–0.07)
Basophils Absolute: 0 10*3/uL (ref 0.0–0.1)
Basophils Relative: 1 %
Eosinophils Absolute: 0.1 10*3/uL (ref 0.0–0.5)
Eosinophils Relative: 2 %
HCT: 31.3 % — ABNORMAL LOW (ref 39.0–52.0)
Hemoglobin: 10 g/dL — ABNORMAL LOW (ref 13.0–17.0)
Immature Granulocytes: 1 %
Lymphocytes Relative: 16 %
Lymphs Abs: 1.1 10*3/uL (ref 0.7–4.0)
MCH: 31.7 pg (ref 26.0–34.0)
MCHC: 31.9 g/dL (ref 30.0–36.0)
MCV: 99.4 fL (ref 80.0–100.0)
Monocytes Absolute: 0.5 10*3/uL (ref 0.1–1.0)
Monocytes Relative: 8 %
Neutro Abs: 5.3 10*3/uL (ref 1.7–7.7)
Neutrophils Relative %: 72 %
Platelets: 247 10*3/uL (ref 150–400)
RBC: 3.15 MIL/uL — ABNORMAL LOW (ref 4.22–5.81)
RDW: 21.5 % — ABNORMAL HIGH (ref 11.5–15.5)
WBC: 7.2 10*3/uL (ref 4.0–10.5)
nRBC: 0 % (ref 0.0–0.2)

## 2019-12-19 LAB — IRON AND TIBC
Iron: 31 ug/dL — ABNORMAL LOW (ref 45–182)
Saturation Ratios: 11 % — ABNORMAL LOW (ref 17.9–39.5)
TIBC: 284 ug/dL (ref 250–450)
UIBC: 253 ug/dL

## 2019-12-19 LAB — FERRITIN: Ferritin: 191 ng/mL (ref 24–336)

## 2019-12-19 LAB — FOLATE: Folate: 43.5 ng/mL (ref >5.9)

## 2019-12-19 LAB — MAGNESIUM: Magnesium: 1.9 mg/dL (ref 1.7–2.4)

## 2019-12-19 MED ORDER — HEPARIN SOD (PORK) LOCK FLUSH 100 UNIT/ML IV SOLN
500.0000 [IU] | Freq: Once | INTRAVENOUS | Status: AC
  Administered 2019-12-19: 500 [IU] via INTRAVENOUS

## 2019-12-19 MED ORDER — GABAPENTIN 300 MG PO CAPS: 600.0000 mg | ORAL_CAPSULE | Freq: Three times a day (TID) | ORAL | 3 refills | Status: DC

## 2019-12-19 MED ORDER — SODIUM CHLORIDE 0.9% FLUSH
10.0000 mL | Freq: Once | INTRAVENOUS | Status: AC
  Administered 2019-12-19: 10 mL via INTRAVENOUS

## 2019-12-19 NOTE — Patient Instructions (Signed)
[place] at [facility] Discharge Instructions  You were seen today by Dr. Delton Coombes. He went over your recent results, including changes in your chest scans. You will stop taking your current cancer therapy and begin taking another medication for your cancer. You were prescribed some gabapentin. Dr. Delton Coombes will see you back in 1 week for labs and follow up.   Thank you for choosing [place] [facility] to provide your oncology and hematology care.  To afford each patient quality time with our provider, please arrive at least 15 minutes before your scheduled appointment time.   If you have a lab appointment with the [facility] please come in thru the  Main Entrance and check in at the main information desk  You need to re-schedule your appointment should you arrive 10 or more minutes late.  We strive to give you quality time with our providers, and arriving late affects you and other patients whose appointments are after yours.  Also, if you no show three or more times for appointments you may be dismissed from the clinic at the providers discretion.     Again, thank you for choosing [facility].  Our hope is that these requests will decrease the amount of time that you wait before being seen by our physicians.       _____________________________________________________________  Should you have questions after your visit to [facility], please contact our office at [phone] between the hours of 8:00 a.m. and 4:30 p.m.  Voicemails left after 4:00 p.m. will not be returned until the following business day.  For prescription refill requests, have your pharmacy contact our office and allow 72 hours.    Cancer Center Support Programs:   > Cancer Support Group  2nd Tuesday of the month 1pm-2pm, Journey Room

## 2019-12-19 NOTE — Progress Notes (Signed)
[name], [name] [address]   CLINIC:  Medical Oncology/Hematology  PCP:  [name], MD [address] 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 Bulky 5.4 cm right supraclavicular region malignant lymph node conglomeration with extracapsular extension. 2. Surrounding smaller abnormal right level 3 and level 5 lymph nodes, and the lymphadenopathy continues into the superior mediastinum, see Chest CT findings reported separately. 3. No other metastatic disease identified in the neck.   01/16/2017 Imaging   CT chest: IMPRESSION: 1. Extensive lymphadenopathy in the thorax and lower right cervical region, as discussed above. Primary differential considerations include lymphoma/leukemia or small cell carcinoma of the lung. Further evaluation a PET-CT could be considered to assess for additional sites of disease below the diaphragm if clinically appropriate. Additionally, ultrasound-guided biopsy of supraclavicular lymphadenopathy could be considered to establish a tissue diagnosis. 2. Indeterminate lesion in the periphery of segment 8 of the liver measuring 2.7 x 1.7 cm. Attention at time of follow-up PET-CT is recommended. 3. Aortic atherosclerosis, in addition to left main and 3 vessel coronary artery disease. Please note that although the presence of coronary artery calcium documents the presence of coronary artery disease, the severity of this disease and any potential stenosis cannot be assessed on this non-gated CT examination. Assessment for potential risk factor modification, dietary therapy or pharmacologic therapy may be warranted, if clinically  indicated. 4. There are calcifications of the aortic valve. Echocardiographic correlation for evaluation of potential valvular dysfunction may be warranted if clinically indicated. 5. Diffuse bronchial wall thickening with moderate centrilobular and paraseptal emphysema; imaging findings suggestive of underlying COPD.   02/03/2017 Initial Biopsy   (R) neck lymph node biopsy: SMALL CELL CARCINOMA (most likely lung primary).    02/03/2017 Miscellaneous   Port-a-cath attempted by IR; unable to place d/t enlarged SVC.    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 Staging for metastatic disease  EXAM: MRI HEAD WITHOUT AND WITH CONTRAST  TECHNIQUE: Multiplanar, multiecho pulse sequences of the brain and surrounding structures were obtained without and with intravenous contrast.  CONTRAST:  55m MULTIHANCE GADOBENATE DIMEGLUMINE 529 MG/ML IV SOLN  COMPARISON:  None.  FINDINGS: Brain: Negative for hydrocephalus. Cerebral volume normal for age. Small nonenhancing white matter hyperintensities consistent with mild chronic microvascular ischemia. No acute infarct. Negative for hemorrhage or mass or edema  Normal enhancement postcontrast infusion. No enhancing mass lesion. Leptomeningeal enhancement is normal.  Vascular: Normal arterial flow voids.  Normal venous enhancement  Skull and upper cervical spine: Negative  Sinuses/Orbits: Negative  Other: None  IMPRESSION: Negative for metastatic disease.  No acute abnormality.  Mild chronic white matter changes.   04/07/2017 Imaging    PET:  1. Marked reduction in size and metabolic activity of bulky RIGHT supraclavicular adenopathy mediastinal lymphadenopathy. 2. Residual moderate activity remains within  small RIGHT supraclavicular lymph node, RIGHT lower paratracheal lymph node and RIGHT hilar lymph node. 3. Resolution of prevascular and internal mammary mediastinal metastatic hypermetabolic activity. 4.  Resolution of metabolic activity associated with solitary RIGHT hepatic lobe liver metastasis. 5. No evidence of disease progression. 6. 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   05/27/2017 Imaging   MRI brain w/ and w/o contrast IMPRESSION: 1. No metastatic disease identified. 2. Increased nonspecific cerebral white matter signal changes since August. These are most commonly small vessel disease related. 3. New right maxillary sinusitis. Benign appearing retention cysts in the nasopharynx with trace mastoid effusions.   06/12/2017 Imaging   PET-CT IMPRESSION: 1. There are two new hypermetabolic nodules identified within both lower lobes measuring up to 3.1 cm. The appearance is nonspecific and may be inflammatory/infectious in etiology. Pulmonary metastatic disease cannot be excluded and short-term follow-up imaging in 3 months is advised to reassess these nodules. 2. Stable appearance of mild hypermetabolic activity associated with right paratracheal and right hilar lymph nodes. 3. Decrease in FDG uptake associated with index right supraclavicular lymph node. 4. 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 5. Aortic Atherosclerosis (ICD10-I70.0) and Emphysema (ICD10-J43.9).   08/24/2017 -  Chemotherapy   The patient had pegfilgrastim (NEULASTA ONPRO [place]) injection 6 mg, 6 mg, Subcutaneous, Once, 30 of 31 cycles Administration: 6 mg (08/28/2017), 6 mg (09/18/2017), 6 mg (10/09/2017), 6 mg (11/02/2017), 6 mg (11/20/2017), 6 mg (12/18/2017), 6 mg (01/15/2018), 6 mg (02/12/2018), 6 mg (03/12/2018), 6 mg  (04/09/2018), 6 mg (05/07/2018), 6 mg (06/04/2018), 6 mg (07/02/2018), 6 mg (07/30/2018), 6 mg (08/27/2018), 6 mg (09/24/2018), 6 mg (10/29/2018), 6 mg (11/26/2018), 6 mg (12/24/2018), 6 mg (01/21/2019), 6 mg (02/18/2019), 6 mg (03/18/2019), 6 mg (04/15/2019), 6 mg (05/13/2019), 6 mg (06/17/2019), 6 mg (07/22/2019), 6 mg (08/19/2019), 6 mg (09/16/2019), 6 mg (10/21/2019), 6 mg (11/18/2019) topotecan (HYCAMTIN) 2.9 mg in sodium chloride 0.9 % 100 mL chemo infusion, 1.5 mg/m2 = 2.9 mg, Intravenous,  Once, 30 of 31 cycles Dose modification: 1.2 mg/m2 (80 % of original dose 1.5 mg/m2, Cycle 4, Reason: Dose Not Tolerated) Administration: 2.9 mg (08/24/2017), 2.9 mg (08/25/2017), 2.9 mg (08/28/2017), 2.9 mg (08/26/2017), 2.9 mg (08/27/2017), 2.9 mg (09/14/2017), 2.9 mg (09/15/2017), 2.9 mg (09/16/2017), 2.9 mg (09/17/2017), 2.9 mg (09/18/2017), 2.9 mg (10/05/2017), 2.9 mg (10/06/2017), 2.9 mg (10/07/2017), 2.9 mg (10/08/2017), 2.9 mg (10/09/2017), 2.3 mg (10/26/2017), 2.3 mg (10/27/2017), 2.3 mg (10/28/2017), 2.3 mg (10/29/2017), 2.3 mg (11/02/2017), 2.3 mg (11/16/2017), 2.3 mg (11/17/2017), 2.3 mg (11/18/2017), 2.3 mg (11/19/2017), 2.3 mg (11/20/2017), 2.3 mg (12/14/2017), 2.3 mg (12/15/2017), 2.3 mg (12/16/2017), 2.3 mg (12/17/2017), 2.3 mg (12/18/2017), 2.3 mg (01/11/2018), 2.3 mg (01/12/2018), 2.3 mg (01/13/2018), 2.3 mg (01/15/2018), 2.3 mg (02/08/2018), 2.3 mg (02/09/2018), 2.3 mg (02/10/2018), 2.3 mg (02/11/2018), 2.3 mg (02/12/2018), 2.3 mg (03/08/2018), 2.3 mg (03/09/2018), 2.3 mg (03/10/2018), 2.3 mg (03/11/2018), 2.3 mg (03/12/2018), 2.2 mg (04/05/2018), 2.2 mg (04/06/2018), 2.2 mg (04/07/2018), 2.2 mg (04/08/2018), 2.2 mg (04/09/2018), 2.2 mg (05/03/2018), 2.2 mg (05/04/2018), 2.2 mg (05/05/2018), 2.2 mg (05/06/2018), 2.2 mg (05/07/2018), 2.2 mg (05/31/2018), 2.2 mg (06/01/2018), 2.2 mg (06/02/2018), 2.2 mg (06/03/2018), 2.2 mg (06/04/2018), 2.2 mg (06/28/2018), 2.2 mg (06/29/2018), 2.2 mg (06/30/2018), 2.2 mg (07/01/2018), 2.2 mg (07/02/2018), 2.2 mg (07/26/2018), 2.2 mg (07/27/2018), 2.2 mg  (07/28/2018), 2.2 mg (07/29/2018), 2.2 mg (07/30/2018), 2.2 mg (08/23/2018), 2.2 mg (08/24/2018), 2.2 mg (08/25/2018), 2.2 mg (08/26/2018), 2.2 mg (08/27/2018), 2.2 mg (09/20/2018), 2.2 mg (09/21/2018), 2.2 mg (09/22/2018), 2.2 mg (09/23/2018), 2.2 mg (09/24/2018), 2.2  mg (10/25/2018), 2.2 mg (10/26/2018), 2.2 mg (10/27/2018), 2.2 mg (10/28/2018), 2.2 mg (10/29/2018), 2.2 mg (11/22/2018), 2.2 mg (11/23/2018), 2.2 mg (11/24/2018), 2.2 mg (11/25/2018), 2.2 mg (11/26/2018), 2.2 mg (12/20/2018), 2.2 mg (12/21/2018), 2.2 mg (12/22/2018), 2.2 mg (12/23/2018), 2.2 mg (12/24/2018), 2.2 mg (01/17/2019), 2.2 mg (01/18/2019), 2.2 mg (01/19/2019), 2.2 mg (01/20/2019), 2.2 mg (01/21/2019), 2.2 mg (02/14/2019), 2.2 mg (02/15/2019), 2.2 mg (02/16/2019), 2.2 mg (02/17/2019), 2.2 mg (02/18/2019), 2.2 mg (03/14/2019), 2.2 mg (03/15/2019), 2.2 mg (03/16/2019), 2.2 mg (03/17/2019), 2.2 mg (03/18/2019), 2.2 mg (04/11/2019), 2.2 mg (04/12/2019), 2.2 mg (04/13/2019), 2.2 mg (04/14/2019), 2.2 mg (04/15/2019), 2.2 mg (05/09/2019), 2.2 mg (05/10/2019), 2.2 mg (05/11/2019), 2.2 mg (05/12/2019), 2.2 mg (05/13/2019), 2.2 mg (06/13/2019), 2.2 mg (06/14/2019), 2.2 mg (06/15/2019), 2.2 mg (06/16/2019), 2.2 mg (06/17/2019), 2.2 mg (07/18/2019), 2.2 mg (07/19/2019), 2.2 mg (07/20/2019), 2.2 mg (07/21/2019), 2.2 mg (07/22/2019), 2.2 mg (08/15/2019), 2.2 mg (08/16/2019), 2.2 mg (08/17/2019), 2.2 mg (08/18/2019), 2.2 mg (08/19/2019), 2.2 mg (09/12/2019), 2.2 mg (09/13/2019), 2.2 mg (09/14/2019), 2.2 mg (09/15/2019), 2.2 mg (09/16/2019), 2.2 mg (10/17/2019), 2.2 mg (10/18/2019), 2.2 mg (10/19/2019), 2.2 mg (10/20/2019), 2.2 mg (10/21/2019), 2.2 mg (11/14/2019), 2.2 mg (11/15/2019), 2.2 mg (11/16/2019), 2.2 mg (11/17/2019), 2.2 mg (11/18/2019) ondansetron (ZOFRAN) 4 mg in sodium chloride 0.9 % 50 mL IVPB, , Intravenous,  Once, 25 of 26 cycles Administration:  (02/08/2018),  (02/09/2018),  (02/10/2018),  (02/11/2018),  (02/12/2018),  (03/08/2018),  (03/09/2018),  (03/10/2018),  (03/11/2018),  (03/12/2018),  (04/05/2018),  (04/06/2018),  (04/07/2018),  (04/08/2018),  (04/09/2018),   (05/03/2018),  (05/04/2018),  (05/05/2018),  (05/06/2018),  (05/07/2018),  (05/31/2018),  (06/01/2018),  (06/02/2018),  (06/03/2018),  (06/04/2018),  (06/28/2018),  (06/29/2018),  (06/30/2018),  (07/01/2018),  (07/02/2018),  (07/26/2018),  (07/27/2018),  (07/28/2018),  (07/29/2018),  (07/30/2018),  (08/23/2018),  (08/24/2018),  (08/25/2018),  (08/26/2018),  (08/27/2018),  (09/20/2018),  (09/21/2018),  (09/22/2018),  (09/23/2018),  (09/24/2018),  (10/25/2018),  (10/26/2018),  (10/27/2018),  (10/28/2018),  (10/29/2018),  (11/22/2018),  (11/23/2018),  (11/24/2018),  (11/25/2018),  (11/26/2018),  (12/20/2018),  (12/21/2018),  (12/22/2018),  (12/23/2018),  (12/24/2018),  (01/17/2019),  (01/18/2019),  (01/19/2019),  (01/20/2019),  (01/21/2019),  (02/14/2019),  (02/15/2019),  (02/16/2019),  (02/17/2019),  (02/18/2019),  (03/14/2019),  (03/15/2019),  (03/16/2019),  (03/17/2019),  (03/18/2019),  (04/11/2019),  (04/12/2019),  (04/13/2019),  (04/14/2019),  (04/15/2019),  (05/09/2019),  (05/10/2019),  (05/11/2019),  (05/12/2019),  (05/13/2019),  (06/13/2019),  (06/14/2019),  (06/15/2019),  (06/16/2019),  (06/17/2019),  (07/18/2019),  (07/19/2019),  (07/20/2019),  (07/21/2019),  (07/22/2019),  (08/15/2019),  (08/16/2019),  (08/17/2019),  (08/18/2019),  (08/19/2019),  (09/12/2019),  (09/13/2019),  (09/14/2019),  (09/15/2019),  (09/16/2019),  (10/17/2019),  (10/18/2019),  (10/19/2019),  (10/20/2019),  (10/21/2019),  (11/14/2019),  (11/15/2019),  (11/16/2019),  (11/17/2019),  (11/18/2019)  for chemotherapy treatment.      CANCER STAGING: Cancer Staging No matching staging information was found for the patient.  INTERVAL HISTORY:  Mr. [name], a 69 y.o. male, returns for routine follow-up and consideration for next cycle of chemotherapy. [name] was last seen on 11/14/2019.  Due for cycle #31 of topotecan today.   Overall, he tells me he has been feeling pretty well. He still reports coughing. 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  Due to changes in his chest  LN size, he will not get the topotecan today.    REVIEW OF SYSTEMS:  Review of Systems  Constitutional: Positive for appetite change (severely decreased) and fatigue (severe).  Respiratory: Positive  for cough.   Cardiovascular: Negative for leg swelling.  Musculoskeletal: Positive for neck pain (pain in LN).  Neurological: Positive for numbness (feet).  Psychiatric/Behavioral: Positive for sleep disturbance.  All other systems reviewed and are negative.   PAST MEDICAL/SURGICAL HISTORY:  Past Medical History:  Diagnosis Date  . Anxiety   . CAD (coronary artery disease)   . Cancer ([facility])    stage 4 small cell lung cancer   . COPD (chronic obstructive pulmonary disease) ([facility])   . Depression   . Dyspnea    increased exertion  . Feeling of chest tightness   . Heart palpitations   . History of chemotherapy   . Myocardial infarction ([facility])   . Osteopenia   . Panic attacks   . Smoker    Past Surgical History:  Procedure Laterality Date  . BACK SURGERY  12/24/2000   L5,S1  . CORONARY STENT PLACEMENT  2005   RCA & CX  . HERNIA REPAIR Right 1980's  . INGUINAL HERNIA REPAIR  12/1978   right side  . IR FLUORO GUIDE PORT INSERTION RIGHT  04/02/2017  . IR US GUIDE BX ASP/DRAIN  02/03/2017  . IR US GUIDE VASC ACCESS RIGHT  04/02/2017  . NM MYOCAR PERF WALL MOTION  09/07/2009   No ischemia; EF 51%  . SHOULDER SURGERY Left 08/2010  . SPINE SURGERY  2002   L5-S1    SOCIAL HISTORY:  Social History   Socioeconomic History  . Marital status: Single    Spouse name: Not on file  . Number of children: Not on file  . Years of education: Not on file  . Highest education level: Not on file  Occupational History  . Not on file  Tobacco Use  . Smoking status: Current Every Day Smoker    Packs/day: 1.00    Types: Cigarettes  . Smokeless tobacco: Never Used  Substance and Sexual Activity  . Alcohol use: Yes    Comment: occas  . Drug use: No  . Sexual activity: Not on file  Other  Topics Concern  . Not on file  Social History Narrative  . Not on file   Social Determinants of Health   Financial Resource Strain:   . Difficulty of Paying Living Expenses:   Food Insecurity:   . Worried About Charity fundraiser in the Last Year:   . Arboriculturist in the Last Year:   Transportation Needs:   . Film/video editor (Medical):   Marland Kitchen Lack of Transportation (Non-Medical):   Physical Activity:   . Days of Exercise per Week:   . Minutes of Exercise per Session:   Stress:   . Feeling of Stress :   Social Connections:   . Frequency of Communication with Friends and Family:   . Frequency of Social Gatherings with Friends and Family:   . Attends Religious Services:   . Active Member of Clubs or Organizations:   . Attends Archivist Meetings:   Marland Kitchen Marital Status:   Intimate Partner Violence:   . Fear of Current or Ex-Partner:   . Emotionally Abused:   Marland Kitchen Physically Abused:   . Sexually Abused:     FAMILY HISTORY:  Family History  Problem Relation Age of Onset  . Heart attack Father   . Kidney disease Father        renal failure  . Heart failure Mother   . Heart attack Mother   . Cancer Brother   . Diabetes  Brother   . Alcohol abuse Brother   . Diabetes Sister     CURRENT MEDICATIONS:  Current Outpatient Medications  Medication Sig Dispense Refill  . ALPRAZolam (XANAX) 1 MG tablet TAKE (1) TABLET BY MOUTH (4) TIMES DAILY. 120 tablet 0  . Ascorbic Acid (VITAMIN C) 1000 MG tablet Take 1,000 mg by mouth daily.    Marland Kitchen aspirin EC 81 MG tablet Take 81 mg by mouth daily.    Marland Kitchen atorvastatin (LIPITOR) 40 MG tablet TAKE (1) TABLET BY MOUTH ONCE DAILY. 90 tablet 1  . Bioflavonoid Products (ESTER C PO) Take 1 tablet by mouth daily.    . calcium-vitamin D (OSCAL WITH D) 250-125 MG-UNIT tablet Take 1 tablet by mouth daily.    . cholecalciferol (VITAMIN D3) 25 MCG (1000 UT) tablet Take 1,000 Units by mouth daily.    . clotrimazole-betamethasone (LOTRISONE)  cream Apply 1 application topically 2 (two) times daily. 30 g 0  . gabapentin (NEURONTIN) 300 MG capsule Take 2 capsules (600 mg total) by mouth 3 (three) times daily. 180 capsule 3  . hydrOXYzine (ATARAX/VISTARIL) 50 MG tablet Take 1 tablet (50 mg total) by mouth at bedtime as needed. for sleep 90 tablet 0  . Melatonin 10 MG TABS Take 1 capsule by mouth every evening.    . Multiple Vitamins-Minerals (CENTRUM SILVER 50+MEN) TABS Take 1 tablet by mouth daily.    Marland Kitchen oxyCODONE-acetaminophen (PERCOCET/ROXICET) 5-325 MG tablet TAKE 1 TABLET EVERY 8 HOURS AS NEEDED FOR SEVERE PAIN. 90 tablet 0  . Pegfilgrastim (NEULASTA ONPRO [place]) Inject into the skin. Every 21 days    . polyethylene glycol powder (GLYCOLAX/MIRALAX) powder Take 17 g by mouth daily. 3350 g 2  . senna (SENOKOT) 8.6 MG tablet Take 1 tablet by mouth daily.    . topotecan in sodium chloride 0.9 % 100 mL Inject into the vein once. Days 1-5 every 21 days    . vitamin B-12 (CYANOCOBALAMIN) 100 MCG tablet Take 100 mcg by mouth daily.    Marland Kitchen albuterol (VENTOLIN HFA) 108 (90 Base) MCG/ACT inhaler Inhale 2 puffs into the lungs every 6 (six) hours as needed for wheezing or shortness of breath.    . ondansetron (ZOFRAN) 8 MG tablet Take 1 tablet (8 mg total) by mouth 2 (two) times daily as needed. Start on the third day after cisplatin chemotherapy. (Patient not taking: Reported on 12/19/2019) 30 tablet 1  . prochlorperazine (COMPAZINE) 10 MG tablet Take 1 tablet (10 mg total) by mouth every 6 (six) hours as needed (Nausea or vomiting). (Patient not taking: Reported on 12/19/2019) 60 tablet 1   No current facility-administered medications for this visit.    ALLERGIES:  Allergies  Allergen Reactions  . Codeine Nausea Only  . Niaspan [Niacin Er]     PHYSICAL EXAM:  Performance status (ECOG): 1 - Symptomatic but completely ambulatory  Vitals:   12/19/19 1000  BP: (!) 132/56  Pulse: 84  Resp: 18  Temp: (!) 97.3 F (36.3 C)  SpO2: 98%   Wt  Readings from Last 3 Encounters:  12/19/19 150 lb 3.2 oz (68.1 kg)  11/14/19 152 lb (68.9 kg)  10/17/19 152 lb (68.9 kg)   Physical Exam Vitals reviewed.  Constitutional:      Appearance: Normal appearance.  Cardiovascular:     Rate and Rhythm: Normal rate and regular rhythm.     Pulses: Normal pulses.     Heart sounds: Normal heart sounds.  Pulmonary:     Effort: Pulmonary effort is  normal.     Breath sounds: Normal breath sounds.  Abdominal:     Palpations: Abdomen is soft. There is no mass.     Tenderness: There is no abdominal tenderness.  Musculoskeletal:     Right lower leg: No edema.     Left lower leg: No edema.  Lymphadenopathy:     Cervical: Cervical adenopathy present.     Right cervical: Superficial cervical adenopathy (stable) present.  Neurological:     General: No focal deficit present.     Mental Status: He is alert and oriented to person, place, and time.  Psychiatric:        Mood and Affect: Mood normal.        Behavior: Behavior normal.     LABORATORY DATA:  I have reviewed the labs as listed.  CBC Latest Ref Rng & Units 12/19/2019 11/14/2019 10/17/2019  WBC 4.0 - 10.5 K/uL 7.2 6.6 6.7  Hemoglobin 13.0 - 17.0 g/dL 10.0(L) 8.8(L) 9.0(L)  Hematocrit 39.0 - 52.0 % 31.3(L) 27.8(L) 28.7(L)  Platelets 150 - 400 K/uL 247 382 234   CMP Latest Ref Rng & Units 12/19/2019 12/15/2019 11/14/2019  Glucose 70 - 99 mg/dL 100(H) - 105(H)  BUN 8 - 23 mg/dL 25(H) - 21  Creatinine 0.61 - 1.24 mg/dL 0.83 1.00 0.93  Sodium 135 - 145 mmol/L 136 - 135  Potassium 3.5 - 5.1 mmol/L 3.9 - 4.2  Chloride 98 - 111 mmol/L 104 - 104  CO2 22 - 32 mmol/L 24 - 24  Calcium 8.9 - 10.3 mg/dL 8.8(L) - 8.6(L)  Total Protein 6.5 - 8.1 g/dL 6.6 - 6.7  Total Bilirubin 0.3 - 1.2 mg/dL 0.5 - 0.7  Alkaline Phos 38 - 126 U/L 52 - 56  AST 15 - 41 U/L 19 - 13(L)  ALT 0 - 44 U/L 15 - 14    DIAGNOSTIC IMAGING:  I have independently reviewed the scans and discussed with the patient. CT SOFT TISSUE NECK  W CONTRAST  Result Date: 12/16/2019 CLINICAL DATA:  Metastatic lung cancer with right-sided neck pain EXAM: CT NECK WITH CONTRAST TECHNIQUE: Multidetector CT imaging of the neck was performed using the standard protocol following the bolus administration of intravenous contrast. CONTRAST:  147m OMNIPAQUE IOHEXOL 300 MG/ML  SOLN COMPARISON:  01/16/2017 FINDINGS: Pharynx and larynx: No evidence of mass or swelling. Salivary glands: No inflammation, mass, or stone. Thyroid: Normal. Lymph nodes: No recent neck imaging for comparison purposes (most recent available is a 08/13/2017 PET CT). There is a cluster of enlarged and spiculated lymph nodes in the lower right posterior triangle, the largest measuring 24 mm on 2:75. There is also lower right jugular lymphadenopathy with a 29 mm node seen on 2:81, exerting mild mass effect on the lower internal jugular vein. Thoracic adenopathy as described on dedicated chest CT. Vascular: Atherosclerosis without least moderate proximal right ICA narrowing. Limited intracranial: Negative Visualized orbits: Negative Mastoids and visualized paranasal sinuses: Clear Skeleton: No acute or aggressive finding. Upper chest: Reported on dedicated chest CT.  Biapical emphysema. IMPRESSION: Malignant lymphadenopathy in the lower right posterior triangle and jugular chain as described. This is recurrent disease based on a January 2019 PET-CT (the most recent available which covers the neck). Electronically Signed   By: JMonte FantasiaM.D.   On: 12/16/2019 05:53   CT Chest W Contrast  Result Date: 12/15/2019 CLINICAL DATA:  Small cell lung cancer with liver metastasis status post radiation. EXAM: CT CHEST, ABDOMEN, AND PELVIS WITH CONTRAST TECHNIQUE: Multidetector  CT imaging of the chest, abdomen and pelvis was performed following the standard protocol during bolus administration of intravenous contrast. CONTRAST:  19m OMNIPAQUE IOHEXOL 300 MG/ML  SOLN COMPARISON:  09/07/2019 FINDINGS:  CT CHEST FINDINGS Cardiovascular: Calcified and noncalcified plaque in the thoracic aorta with similar appearance to the prior exam. No is sign of aneurysmal dilation of the thoracic aorta. Calcified coronary artery disease as before. Heart size stable without pericardial effusion. Central pulmonary vasculature is unremarkable on venous phase assessment. Mediastinum/Nodes: Enlarging prevascular lymph node (image 16, series 2) 1.6 cm, previously 0.8 cm High RIGHT paratracheal lymph node just below the thoracic inlet (image 7, series 2) 1 cm, previously 8-9 mm. Cystic area in the anterior mediastinum unchanged (image 9, series 2 approximately 1.1 cm. Small AP window lymph node just less than a cm is slightly more rounded than on previous imaging. No hilar adenopathy. No axillary adenopathy. RIGHT-sided Port-A-Cath terminates at the caval to atrial junction. No thoracic inlet adenopathy. Lungs/Pleura: Marked pulmonary emphysema. Scarring of medial aspect of the RIGHT middle lobe. Small nodules in the LEFT lower lobe (image 113, series 3) 8 mm nodule unchanged. Adjacent nodule on image 111 of series 3 is also stable at approximately 6 mm, difficult to separate from surrounding bronchovascular structures. No consolidation. No pleural effusion. Musculoskeletal: No chest wall mass. CT ABDOMEN PELVIS FINDINGS Hepatobiliary: Lobular hepatic contours. No focal, suspicious hepatic lesion. Mild biliary ductal distension, intrahepatic biliary tree is stable. No pericholecystic stranding. Pancreas: Pancreas is normal without focal lesion, ductal dilation or inflammation. Spleen: Spleen normal size without focal lesion. Adrenals/Urinary Tract: Adrenal glands are normal. Low-density lesions in the bilateral kidneys are unchanged cyst in the upper pole of the LEFT kidney and other small renal lesions also likely cysts. Stomach/Bowel: No acute gastrointestinal process. The appendix is normal. Diverticular disease of the sigmoid  colon. Vascular/Lymphatic: Calcific atheromatous plaque and noncalcified plaque of the abdominal aorta is unchanged in there is no evidence of aneurysm. No adenopathy in the retroperitoneum or upper abdomen. No pelvic lymphadenopathy. Reproductive: Prostate unremarkable.  Urinary bladder decompressed. Other: No ascites or free air. Musculoskeletal: No acute musculoskeletal process. No destructive bone finding. IMPRESSION: 1. Enlarging prevascular and high RIGHT paratracheal lymph nodes, concerning for metastatic disease. 2. Stable left lower lobe pulmonary nodules. 3. No evidence of metastatic disease in the abdomen or pelvis. 4. Emphysema and aortic atherosclerosis. Aortic Atherosclerosis (ICD10-I70.0) and Emphysema (ICD10-J43.9). Electronically Signed   By: GZetta BillsM.D.   On: 12/15/2019 17:32   CT Abdomen Pelvis W Contrast  Result Date: 12/15/2019 CLINICAL DATA:  Small cell lung cancer with liver metastasis status post radiation. EXAM: CT CHEST, ABDOMEN, AND PELVIS WITH CONTRAST TECHNIQUE: Multidetector CT imaging of the chest, abdomen and pelvis was performed following the standard protocol during bolus administration of intravenous contrast. CONTRAST:  1247mOMNIPAQUE IOHEXOL 300 MG/ML  SOLN COMPARISON:  09/07/2019 FINDINGS: CT CHEST FINDINGS Cardiovascular: Calcified and noncalcified plaque in the thoracic aorta with similar appearance to the prior exam. No is sign of aneurysmal dilation of the thoracic aorta. Calcified coronary artery disease as before. Heart size stable without pericardial effusion. Central pulmonary vasculature is unremarkable on venous phase assessment. Mediastinum/Nodes: Enlarging prevascular lymph node (image 16, series 2) 1.6 cm, previously 0.8 cm High RIGHT paratracheal lymph node just below the thoracic inlet (image 7, series 2) 1 cm, previously 8-9 mm. Cystic area in the anterior mediastinum unchanged (image 9, series 2 approximately 1.1 cm. Small AP window lymph node just  less  than a cm is slightly more rounded than on previous imaging. No hilar adenopathy. No axillary adenopathy. RIGHT-sided Port-A-Cath terminates at the caval to atrial junction. No thoracic inlet adenopathy. Lungs/Pleura: Marked pulmonary emphysema. Scarring of medial aspect of the RIGHT middle lobe. Small nodules in the LEFT lower lobe (image 113, series 3) 8 mm nodule unchanged. Adjacent nodule on image 111 of series 3 is also stable at approximately 6 mm, difficult to separate from surrounding bronchovascular structures. No consolidation. No pleural effusion. Musculoskeletal: No chest wall mass. CT ABDOMEN PELVIS FINDINGS Hepatobiliary: Lobular hepatic contours. No focal, suspicious hepatic lesion. Mild biliary ductal distension, intrahepatic biliary tree is stable. No pericholecystic stranding. Pancreas: Pancreas is normal without focal lesion, ductal dilation or inflammation. Spleen: Spleen normal size without focal lesion. Adrenals/Urinary Tract: Adrenal glands are normal. Low-density lesions in the bilateral kidneys are unchanged cyst in the upper pole of the LEFT kidney and other small renal lesions also likely cysts. Stomach/Bowel: No acute gastrointestinal process. The appendix is normal. Diverticular disease of the sigmoid colon. Vascular/Lymphatic: Calcific atheromatous plaque and noncalcified plaque of the abdominal aorta is unchanged in there is no evidence of aneurysm. No adenopathy in the retroperitoneum or upper abdomen. No pelvic lymphadenopathy. Reproductive: Prostate unremarkable.  Urinary bladder decompressed. Other: No ascites or free air. Musculoskeletal: No acute musculoskeletal process. No destructive bone finding. IMPRESSION: 1. Enlarging prevascular and high RIGHT paratracheal lymph nodes, concerning for metastatic disease. 2. Stable left lower lobe pulmonary nodules. 3. No evidence of metastatic disease in the abdomen or pelvis. 4. Emphysema and aortic atherosclerosis. Aortic  Atherosclerosis (ICD10-I70.0) and Emphysema (ICD10-J43.9). Electronically Signed   By: Zetta Bills M.D.   On: 12/15/2019 17:32     ASSESSMENT:  1.  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 -CT CAP on 12/15/2019 showed enlarging prevascular and right paratracheal lymph nodes.  Stable left lower lobe lung nodules. -CT neck on 12/15/2019 showed malignant lymphadenopathy in the lower right posterior triangle and jugular chain.   PLAN:  1.  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 -I have independently compared the scan with prior scan and PET scan prior to start of topotecan.  Clearly the right neck adenopathy is new and there is progression of lymphadenopathy in the mediastinum. -I have recommended discontinuation of topotecan. -We will plan to start him on Lurbinectidin and every 3 weekly. -We reviewed side effects in detail.  We will plan to start him on as soon as we get prior authorization from insurance.  2.  Normocytic anemia: -Hemoglobin today is 10.0.  This is from myelosuppression from chemotherapy. -Ferritin was 191 and percent saturation was 11 on 12/19/2019.  Folic acid and G92 was normal.  3.  Chronic pain: -Continue Percocet 1 tablet daily.  If he takes more he will get constipated.  4.  Peripheral neuropathy: -Continue gabapentin 600 mg 3 times daily.    Orders placed this encounter:  No orders of the defined types were placed in this encounter.  Total time spent is 40 minutes with more than 50% of the time spent face-to-face discussing and reviewing scans, change in treatment plan, side effects, counseling and coordination of care.  Derek Jack, MD [place] [phone]   I, Milinda Antis, am acting as a scribe for Dr. Sanda Linger.  I, Derek Jack MD, have reviewed the above documentation for accuracy and  completeness, and I  agree with the above.

## 2019-12-19 NOTE — Progress Notes (Signed)
START OFF PATHWAY REGIMEN - Small Cell Lung   [id number]:Lurbinectedin 3.2 mg/m2 IV D1 q21 Days:   A cycle is every 21 days:     Lurbinectedin   **Always confirm dose/schedule in your pharmacy ordering system**  Patient Characteristics: Relapsed or Progressive Disease, Third Line and Beyond Therapeutic Status: Relapsed or Progressive Disease Line of Therapy: Third Line and Beyond  Intent of Therapy: Non-Curative / Palliative Intent, Discussed with Patient

## 2019-12-19 NOTE — Progress Notes (Signed)
Patients port flushed without difficulty.  Good blood return noted with no bruising or swelling noted at site.  Band aid applied.  VSS with discharge and left ambulatory with no s/s of distress noted.  

## 2019-12-19 NOTE — Progress Notes (Signed)
Patient has been assessed, vital signs and labs have been reviewed by Dr. Delton Coombes. No treatment today. He will change him to Lurbinectidin every 3 weeks.

## 2019-12-20 ENCOUNTER — Ambulatory Visit (HOSPITAL_COMMUNITY): Payer: Medicare HMO

## 2019-12-21 ENCOUNTER — Ambulatory Visit (HOSPITAL_COMMUNITY): Payer: Medicare HMO

## 2019-12-22 ENCOUNTER — Encounter (HOSPITAL_COMMUNITY): Payer: Self-pay

## 2019-12-22 ENCOUNTER — Inpatient Hospital Stay (HOSPITAL_COMMUNITY): Payer: Medicare HMO

## 2019-12-22 ENCOUNTER — Other Ambulatory Visit: Payer: Self-pay

## 2019-12-22 VITALS — BP 122/52 | HR 58 | Temp 97.7°F | Resp 18

## 2019-12-22 DIAGNOSIS — C787 Secondary malignant neoplasm of liver and intrahepatic bile duct: Secondary | ICD-10-CM | POA: Diagnosis not present

## 2019-12-22 DIAGNOSIS — C3490 Malignant neoplasm of unspecified part of unspecified bronchus or lung: Secondary | ICD-10-CM

## 2019-12-22 DIAGNOSIS — Z5111 Encounter for antineoplastic chemotherapy: Secondary | ICD-10-CM | POA: Diagnosis not present

## 2019-12-22 DIAGNOSIS — D649 Anemia, unspecified: Secondary | ICD-10-CM | POA: Diagnosis not present

## 2019-12-22 MED ORDER — SODIUM CHLORIDE 0.9 % IV SOLN: Freq: Once | INTRAVENOUS | Status: AC

## 2019-12-22 MED ORDER — HEPARIN SOD (PORK) LOCK FLUSH 100 UNIT/ML IV SOLN
500.0000 [IU] | Freq: Once | INTRAVENOUS | Status: AC | PRN
  Administered 2019-12-22: 500 [IU]

## 2019-12-22 MED ORDER — SODIUM CHLORIDE 0.9 % IV SOLN
3.2000 mg/m2 | Freq: Once | INTRAVENOUS | Status: AC
  Administered 2019-12-22: 5.8 mg via INTRAVENOUS
  Filled 2019-12-22: qty 11.6

## 2019-12-22 MED ORDER — SODIUM CHLORIDE 0.9 % IV SOLN
10.0000 mg | Freq: Once | INTRAVENOUS | Status: AC
  Administered 2019-12-22: 10 mg via INTRAVENOUS
  Filled 2019-12-22: qty 10

## 2019-12-22 MED ORDER — SODIUM CHLORIDE 0.9% FLUSH
10.0000 mL | INTRAVENOUS | Status: DC | PRN
  Administered 2019-12-22: 10 mL

## 2019-12-22 MED ORDER — PALONOSETRON HCL INJECTION 0.25 MG/5ML
0.2500 mg | Freq: Once | INTRAVENOUS | Status: AC
  Administered 2019-12-22: 0.25 mg via INTRAVENOUS

## 2019-12-22 MED ORDER — PALONOSETRON HCL INJECTION 0.25 MG/5ML
INTRAVENOUS | Status: AC
  Filled 2019-12-22: qty 5

## 2019-12-22 NOTE — Progress Notes (Signed)
Harvie Junior tolerated Zepzelca infusion well without complaints or incident. Written information given and reviewed with pt on this new medication with permit signed. VSS upon discharge. Pt discharged self ambulatory in satisfactory condition

## 2019-12-22 NOTE — Patient Instructions (Signed)
[facility] Discharge Instructions for Patients Receiving Chemotherapy   Beginning January 23rd 2017 lab work for the [facility] will be done in the  Main lab at [facility] on 1st floor. If you have a lab appointment with the [facility] please come in thru the  Main Entrance and check in at the main information desk   Today you received the following chemotherapy agents Zepzelca. Follow-up as scheduled  To help prevent nausea and vomiting after your treatment, we encourage you to take your nausea medication   If you develop nausea and vomiting, or diarrhea that is not controlled by your medication, call the clinic.  The clinic phone number is [phone]. Office hours are Monday-Friday 8:30am-5:00pm.  BELOW ARE SYMPTOMS THAT SHOULD BE REPORTED IMMEDIATELY:  *FEVER GREATER THAN 101.0 F  *CHILLS WITH OR WITHOUT FEVER  NAUSEA AND VOMITING THAT IS NOT CONTROLLED WITH YOUR NAUSEA MEDICATION  *UNUSUAL SHORTNESS OF BREATH  *UNUSUAL BRUISING OR BLEEDING  TENDERNESS IN MOUTH AND THROAT WITH OR WITHOUT PRESENCE OF ULCERS  *URINARY PROBLEMS  *BOWEL PROBLEMS  UNUSUAL RASH Items with * indicate a potential emergency and should be followed up as soon as possible. If you have an emergency after office hours please contact your primary care physician or go to the nearest emergency department.  Please call the clinic during office hours if you have any questions or concerns.   You may also contact the Patient Navigator at [phone] should you have any questions or need assistance in obtaining follow up care.      Resources For Cancer Patients and their Caregivers ? American Cancer Society: Can assist with transportation, wigs, general needs, runs Look Good Feel Better.        512-024-7379 ? Cancer Care: Provides financial assistance, online support groups, medication/co-pay assistance.  1-800-813-HOPE 514-541-3137) ? [facility] Assists [place] cancer patients and their families through emotional , educational and financial support.  317-188-0720 ? [place] DSS Where to apply for food stamps, Medicaid and utility assistance. (339)024-4510 ? [facility]: Transportation to medical appointments. 628-841-5056 ? Social Security Administration: May apply for disability if have a Stage IV cancer. 530-550-1801 803-408-8077 ? [employer], Disability and Transit Services: Assists with nutrition, care and transit needs. [phone]

## 2019-12-23 ENCOUNTER — Ambulatory Visit (HOSPITAL_COMMUNITY): Payer: Medicare HMO

## 2019-12-23 ENCOUNTER — Telehealth (HOSPITAL_COMMUNITY): Payer: Self-pay

## 2019-12-23 NOTE — Telephone Encounter (Signed)
24 hour post chemotherapy call.  No answer.  Message left on answering machine to call for follow up phone call.

## 2019-12-26 ENCOUNTER — Other Ambulatory Visit (HOSPITAL_COMMUNITY): Payer: Self-pay | Admitting: Hematology

## 2019-12-26 DIAGNOSIS — C3490 Malignant neoplasm of unspecified part of unspecified bronchus or lung: Secondary | ICD-10-CM

## 2019-12-29 ENCOUNTER — Inpatient Hospital Stay (HOSPITAL_COMMUNITY): Payer: Medicare HMO | Admitting: Hematology

## 2019-12-29 ENCOUNTER — Other Ambulatory Visit: Payer: Self-pay

## 2019-12-29 ENCOUNTER — Inpatient Hospital Stay (HOSPITAL_COMMUNITY): Payer: Medicare HMO | Attending: Hematology

## 2019-12-29 VITALS — BP 129/61 | HR 71 | Temp 97.5°F | Resp 18 | Wt 151.8 lb

## 2019-12-29 DIAGNOSIS — C787 Secondary malignant neoplasm of liver and intrahepatic bile duct: Secondary | ICD-10-CM | POA: Diagnosis not present

## 2019-12-29 DIAGNOSIS — F419 Anxiety disorder, unspecified: Secondary | ICD-10-CM | POA: Insufficient documentation

## 2019-12-29 DIAGNOSIS — F17210 Nicotine dependence, cigarettes, uncomplicated: Secondary | ICD-10-CM | POA: Insufficient documentation

## 2019-12-29 DIAGNOSIS — C3490 Malignant neoplasm of unspecified part of unspecified bronchus or lung: Secondary | ICD-10-CM | POA: Diagnosis not present

## 2019-12-29 DIAGNOSIS — Z7982 Long term (current) use of aspirin: Secondary | ICD-10-CM | POA: Insufficient documentation

## 2019-12-29 DIAGNOSIS — Z79899 Other long term (current) drug therapy: Secondary | ICD-10-CM | POA: Diagnosis not present

## 2019-12-29 DIAGNOSIS — C3432 Malignant neoplasm of lower lobe, left bronchus or lung: Secondary | ICD-10-CM | POA: Insufficient documentation

## 2019-12-29 DIAGNOSIS — J449 Chronic obstructive pulmonary disease, unspecified: Secondary | ICD-10-CM | POA: Diagnosis not present

## 2019-12-29 DIAGNOSIS — M8580 Other specified disorders of bone density and structure, unspecified site: Secondary | ICD-10-CM | POA: Diagnosis not present

## 2019-12-29 DIAGNOSIS — T451X5A Adverse effect of antineoplastic and immunosuppressive drugs, initial encounter: Secondary | ICD-10-CM | POA: Diagnosis not present

## 2019-12-29 DIAGNOSIS — G629 Polyneuropathy, unspecified: Secondary | ICD-10-CM | POA: Insufficient documentation

## 2019-12-29 DIAGNOSIS — G620 Drug-induced polyneuropathy: Secondary | ICD-10-CM | POA: Diagnosis not present

## 2019-12-29 DIAGNOSIS — Z5111 Encounter for antineoplastic chemotherapy: Secondary | ICD-10-CM | POA: Diagnosis not present

## 2019-12-29 DIAGNOSIS — I252 Old myocardial infarction: Secondary | ICD-10-CM | POA: Diagnosis not present

## 2019-12-29 DIAGNOSIS — D649 Anemia, unspecified: Secondary | ICD-10-CM | POA: Diagnosis not present

## 2019-12-29 LAB — CBC WITH DIFFERENTIAL/PLATELET
Abs Immature Granulocytes: 0.02 10*3/uL (ref 0.00–0.07)
Basophils Absolute: 0 10*3/uL (ref 0.0–0.1)
Basophils Relative: 0 %
Eosinophils Absolute: 0.3 10*3/uL (ref 0.0–0.5)
Eosinophils Relative: 4 %
HCT: 29.8 % — ABNORMAL LOW (ref 39.0–52.0)
Hemoglobin: 9.3 g/dL — ABNORMAL LOW (ref 13.0–17.0)
Immature Granulocytes: 0 %
Lymphocytes Relative: 12 %
Lymphs Abs: 0.8 10*3/uL (ref 0.7–4.0)
MCH: 31.2 pg (ref 26.0–34.0)
MCHC: 31.2 g/dL (ref 30.0–36.0)
MCV: 100 fL (ref 80.0–100.0)
Monocytes Absolute: 0.1 10*3/uL (ref 0.1–1.0)
Monocytes Relative: 2 %
Neutro Abs: 5.5 10*3/uL (ref 1.7–7.7)
Neutrophils Relative %: 82 %
Platelets: 98 10*3/uL — ABNORMAL LOW (ref 150–400)
RBC: 2.98 MIL/uL — ABNORMAL LOW (ref 4.22–5.81)
RDW: 20.4 % — ABNORMAL HIGH (ref 11.5–15.5)
WBC: 6.8 10*3/uL (ref 4.0–10.5)
nRBC: 0 % (ref 0.0–0.2)

## 2019-12-29 LAB — COMPREHENSIVE METABOLIC PANEL
ALT: 23 U/L (ref 0–44)
AST: 20 U/L (ref 15–41)
Albumin: 3.7 g/dL (ref 3.5–5.0)
Alkaline Phosphatase: 52 U/L (ref 38–126)
Anion gap: 8 (ref 5–15)
BUN: 23 mg/dL (ref 8–23)
CO2: 26 mmol/L (ref 22–32)
Calcium: 8.5 mg/dL — ABNORMAL LOW (ref 8.9–10.3)
Chloride: 103 mmol/L (ref 98–111)
Creatinine, Ser: 0.88 mg/dL (ref 0.61–1.24)
GFR calc Af Amer: >60 mL/min (ref >60)
GFR calc non Af Amer: >60 mL/min (ref >60)
Glucose, Bld: 124 mg/dL — ABNORMAL HIGH (ref 70–99)
Potassium: 3.9 mmol/L (ref 3.5–5.1)
Sodium: 137 mmol/L (ref 135–145)
Total Bilirubin: 0.4 mg/dL (ref 0.3–1.2)
Total Protein: 6.5 g/dL (ref 6.5–8.1)

## 2019-12-29 LAB — LACTATE DEHYDROGENASE: LDH: 139 U/L (ref 98–192)

## 2019-12-29 LAB — MAGNESIUM: Magnesium: 2.1 mg/dL (ref 1.7–2.4)

## 2019-12-29 MED ORDER — DOCUSATE SODIUM 100 MG PO CAPS: ORAL_CAPSULE | ORAL | 3 refills | Status: AC

## 2019-12-29 NOTE — Patient Instructions (Signed)
[place] [facility] Discharge Instructions  You were seen today by Dr. Delton Coombes. He went over your recent results. Take stool softener and laxative as needed for constipation; take 2 tablet of stool softener every night for the next 3 nights. You will be seen by a PA in 2 weeks for labs and follow up.   Thank you for choosing [place] at [facility] to provide your oncology and hematology care.  To afford each patient quality time with our provider, please arrive at least 15 minutes before your scheduled appointment time.   If you have a lab appointment with the [facility] please come in thru the Main Entrance and check in at the main information desk  You need to re-schedule your appointment should you arrive 10 or more minutes late.  We strive to give you quality time with our providers, and arriving late affects you and other patients whose appointments are after yours.  Also, if you no show three or more times for appointments you may be dismissed from the clinic at the providers discretion.     Again, thank you for choosing [facility].  Our hope is that these requests will decrease the amount of time that you wait before being seen by our physicians.       _____________________________________________________________  Should you have questions after your visit to [facility], please contact our office at [phone] between the hours of 8:00 a.m. and 4:30 p.m.  Voicemails left after 4:00 p.m. will not be returned until the following business day.  For prescription refill requests, have your pharmacy contact our office and allow 72 hours.    Cancer Center Support Programs:   > Cancer Support Group  2nd Tuesday of the month 1pm-2pm, Journey Room

## 2019-12-29 NOTE — Progress Notes (Signed)
[name], [name] [address]   CLINIC:  Medical Oncology/Hematology  PCP:  [name], MD [address] 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 Bulky 5.4 cm right supraclavicular region malignant lymph node conglomeration with extracapsular extension. 2. Surrounding smaller abnormal right level 3 and level 5 lymph nodes, and the lymphadenopathy continues into the superior mediastinum, see Chest CT findings reported separately. 3. No other metastatic disease identified in the neck.   01/16/2017 Imaging   CT chest: IMPRESSION: 1. Extensive lymphadenopathy in the thorax and lower right cervical region, as discussed above. Primary differential considerations include lymphoma/leukemia or small cell carcinoma of the lung. Further evaluation a PET-CT could be considered to assess for additional sites of disease below the diaphragm if clinically appropriate. Additionally, ultrasound-guided biopsy of supraclavicular lymphadenopathy could be considered to establish a tissue diagnosis. 2. Indeterminate lesion in the periphery of segment 8 of the liver measuring 2.7 x 1.7 cm. Attention at time of follow-up PET-CT is recommended. 3. Aortic atherosclerosis, in addition to left main and 3 vessel coronary artery disease. Please note that although the presence of coronary artery calcium documents the presence of coronary artery disease, the severity of this disease and any potential stenosis cannot be assessed on this non-gated CT examination. Assessment for potential risk factor modification, dietary therapy or pharmacologic therapy may be warranted, if clinically  indicated. 4. There are calcifications of the aortic valve. Echocardiographic correlation for evaluation of potential valvular dysfunction may be warranted if clinically indicated. 5. Diffuse bronchial wall thickening with moderate centrilobular and paraseptal emphysema; imaging findings suggestive of underlying COPD.   02/03/2017 Initial Biopsy   (R) neck lymph node biopsy: SMALL CELL CARCINOMA (most likely lung primary).    02/03/2017 Miscellaneous   Port-a-cath attempted by IR; unable to place d/t enlarged SVC.    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 Staging for metastatic disease  EXAM: MRI HEAD WITHOUT AND WITH CONTRAST  TECHNIQUE: Multiplanar, multiecho pulse sequences of the brain and surrounding structures were obtained without and with intravenous contrast.  CONTRAST:  55m MULTIHANCE GADOBENATE DIMEGLUMINE 529 MG/ML IV SOLN  COMPARISON:  None.  FINDINGS: Brain: Negative for hydrocephalus. Cerebral volume normal for age. Small nonenhancing white matter hyperintensities consistent with mild chronic microvascular ischemia. No acute infarct. Negative for hemorrhage or mass or edema  Normal enhancement postcontrast infusion. No enhancing mass lesion. Leptomeningeal enhancement is normal.  Vascular: Normal arterial flow voids.  Normal venous enhancement  Skull and upper cervical spine: Negative  Sinuses/Orbits: Negative  Other: None  IMPRESSION: Negative for metastatic disease.  No acute abnormality.  Mild chronic white matter changes.   04/07/2017 Imaging    PET:  1. Marked reduction in size and metabolic activity of bulky RIGHT supraclavicular adenopathy mediastinal lymphadenopathy. 2. Residual moderate activity remains within  small RIGHT supraclavicular lymph node, RIGHT lower paratracheal lymph node and RIGHT hilar lymph node. 3. Resolution of prevascular and internal mammary mediastinal metastatic hypermetabolic activity. 4.  Resolution of metabolic activity associated with solitary RIGHT hepatic lobe liver metastasis. 5. No evidence of disease progression. 6. 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   05/27/2017 Imaging   MRI brain w/ and w/o contrast IMPRESSION: 1. No metastatic disease identified. 2. Increased nonspecific cerebral white matter signal changes since August. These are most commonly small vessel disease related. 3. New right maxillary sinusitis. Benign appearing retention cysts in the nasopharynx with trace mastoid effusions.   06/12/2017 Imaging   PET-CT IMPRESSION: 1. There are two new hypermetabolic nodules identified within both lower lobes measuring up to 3.1 cm. The appearance is nonspecific and may be inflammatory/infectious in etiology. Pulmonary metastatic disease cannot be excluded and short-term follow-up imaging in 3 months is advised to reassess these nodules. 2. Stable appearance of mild hypermetabolic activity associated with right paratracheal and right hilar lymph nodes. 3. Decrease in FDG uptake associated with index right supraclavicular lymph node. 4. 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 5. Aortic Atherosclerosis (ICD10-I70.0) and Emphysema (ICD10-J43.9).   08/24/2017 - 12/18/2019 Chemotherapy   The patient had pegfilgrastim (NEULASTA ONPRO KIT) injection 6 mg, 6 mg, Subcutaneous, Once, 30 of 31 cycles Administration: 6 mg (08/28/2017), 6 mg (09/18/2017), 6 mg (10/09/2017), 6 mg (11/02/2017), 6 mg (11/20/2017), 6 mg (12/18/2017), 6 mg (01/15/2018), 6 mg (02/12/2018), 6 mg  (03/12/2018), 6 mg (04/09/2018), 6 mg (05/07/2018), 6 mg (06/04/2018), 6 mg (07/02/2018), 6 mg (07/30/2018), 6 mg (08/27/2018), 6 mg (09/24/2018), 6 mg (10/29/2018), 6 mg (11/26/2018), 6 mg (12/24/2018), 6 mg (01/21/2019), 6 mg (02/18/2019), 6 mg (03/18/2019), 6 mg (04/15/2019), 6 mg (05/13/2019), 6 mg (06/17/2019), 6 mg (07/22/2019), 6 mg (08/19/2019), 6 mg (09/16/2019), 6 mg (10/21/2019), 6 mg (11/18/2019) topotecan (HYCAMTIN) 2.9 mg in sodium chloride 0.9 % 100 mL chemo infusion, 1.5 mg/m2 = 2.9 mg, Intravenous,  Once, 30 of 31 cycles Dose modification: 1.2 mg/m2 (80 % of original dose 1.5 mg/m2, Cycle 4, Reason: Dose Not Tolerated) Administration: 2.9 mg (08/24/2017), 2.9 mg (08/25/2017), 2.9 mg (08/28/2017), 2.9 mg (08/26/2017), 2.9 mg (08/27/2017), 2.9 mg (09/14/2017), 2.9 mg (09/15/2017), 2.9 mg (09/16/2017), 2.9 mg (09/17/2017), 2.9 mg (09/18/2017), 2.9 mg (10/05/2017), 2.9 mg (10/06/2017), 2.9 mg (10/07/2017), 2.9 mg (10/08/2017), 2.9 mg (10/09/2017), 2.3 mg (10/26/2017), 2.3 mg (10/27/2017), 2.3 mg (10/28/2017), 2.3 mg (10/29/2017), 2.3 mg (11/02/2017), 2.3 mg (11/16/2017), 2.3 mg (11/17/2017), 2.3 mg (11/18/2017), 2.3 mg (11/19/2017), 2.3 mg (11/20/2017), 2.3 mg (12/14/2017), 2.3 mg (12/15/2017), 2.3 mg (12/16/2017), 2.3 mg (12/17/2017), 2.3 mg (12/18/2017), 2.3 mg (01/11/2018), 2.3 mg (01/12/2018), 2.3 mg (01/13/2018), 2.3 mg (01/15/2018), 2.3 mg (02/08/2018), 2.3 mg (02/09/2018), 2.3 mg (02/10/2018), 2.3 mg (02/11/2018), 2.3 mg (02/12/2018), 2.3 mg (03/08/2018), 2.3 mg (03/09/2018), 2.3 mg (03/10/2018), 2.3 mg (03/11/2018), 2.3 mg (03/12/2018), 2.2 mg (04/05/2018), 2.2 mg (04/06/2018), 2.2 mg (04/07/2018), 2.2 mg (04/08/2018), 2.2 mg (04/09/2018), 2.2 mg (05/03/2018), 2.2 mg (05/04/2018), 2.2 mg (05/05/2018), 2.2 mg (05/06/2018), 2.2 mg (05/07/2018), 2.2 mg (05/31/2018), 2.2 mg (06/01/2018), 2.2 mg (06/02/2018), 2.2 mg (06/03/2018), 2.2 mg (06/04/2018), 2.2 mg (06/28/2018), 2.2 mg (06/29/2018), 2.2 mg (06/30/2018), 2.2 mg (07/01/2018), 2.2 mg (07/02/2018), 2.2 mg (07/26/2018), 2.2 mg  (07/27/2018), 2.2 mg (07/28/2018), 2.2 mg (07/29/2018), 2.2 mg (07/30/2018), 2.2 mg (08/23/2018), 2.2 mg (08/24/2018), 2.2 mg (08/25/2018), 2.2 mg (08/26/2018), 2.2 mg (08/27/2018), 2.2 mg (09/20/2018), 2.2 mg (09/21/2018), 2.2 mg (09/22/2018), 2.2 mg (09/23/2018), 2.2 mg (09/24/2018), 2.2  mg (10/25/2018), 2.2 mg (10/26/2018), 2.2 mg (10/27/2018), 2.2 mg (10/28/2018), 2.2 mg (10/29/2018), 2.2 mg (11/22/2018), 2.2 mg (11/23/2018), 2.2 mg (11/24/2018), 2.2 mg (11/25/2018), 2.2 mg (11/26/2018), 2.2 mg (12/20/2018), 2.2 mg (12/21/2018), 2.2 mg (12/22/2018), 2.2 mg (12/23/2018), 2.2 mg (12/24/2018), 2.2 mg (01/17/2019), 2.2 mg (01/18/2019), 2.2 mg (01/19/2019), 2.2 mg (01/20/2019), 2.2 mg (01/21/2019), 2.2 mg (02/14/2019), 2.2 mg (02/15/2019), 2.2 mg (02/16/2019), 2.2 mg (02/17/2019), 2.2 mg (02/18/2019), 2.2 mg (03/14/2019), 2.2 mg (03/15/2019), 2.2 mg (03/16/2019), 2.2 mg (03/17/2019), 2.2 mg (03/18/2019), 2.2 mg (04/11/2019), 2.2 mg (04/12/2019), 2.2 mg (04/13/2019), 2.2 mg (04/14/2019), 2.2 mg (04/15/2019), 2.2 mg (05/09/2019), 2.2 mg (05/10/2019), 2.2 mg (05/11/2019), 2.2 mg (05/12/2019), 2.2 mg (05/13/2019), 2.2 mg (06/13/2019), 2.2 mg (06/14/2019), 2.2 mg (06/15/2019), 2.2 mg (06/16/2019), 2.2 mg (06/17/2019), 2.2 mg (07/18/2019), 2.2 mg (07/19/2019), 2.2 mg (07/20/2019), 2.2 mg (07/21/2019), 2.2 mg (07/22/2019), 2.2 mg (08/15/2019), 2.2 mg (08/16/2019), 2.2 mg (08/17/2019), 2.2 mg (08/18/2019), 2.2 mg (08/19/2019), 2.2 mg (09/12/2019), 2.2 mg (09/13/2019), 2.2 mg (09/14/2019), 2.2 mg (09/15/2019), 2.2 mg (09/16/2019), 2.2 mg (10/17/2019), 2.2 mg (10/18/2019), 2.2 mg (10/19/2019), 2.2 mg (10/20/2019), 2.2 mg (10/21/2019), 2.2 mg (11/14/2019), 2.2 mg (11/15/2019), 2.2 mg (11/16/2019), 2.2 mg (11/17/2019), 2.2 mg (11/18/2019) ondansetron (ZOFRAN) 4 mg in sodium chloride 0.9 % 50 mL IVPB, , Intravenous,  Once, 25 of 26 cycles Administration:  (02/08/2018),  (02/09/2018),  (02/10/2018),  (02/11/2018),  (02/12/2018),  (03/08/2018),  (03/09/2018),  (03/10/2018),  (03/11/2018),  (03/12/2018),  (04/05/2018),  (04/06/2018),  (04/07/2018),  (04/08/2018),   (04/09/2018),  (05/03/2018),  (05/04/2018),  (05/05/2018),  (05/06/2018),  (05/07/2018),  (05/31/2018),  (06/01/2018),  (06/02/2018),  (06/03/2018),  (06/04/2018),  (06/28/2018),  (06/29/2018),  (06/30/2018),  (07/01/2018),  (07/02/2018),  (07/26/2018),  (07/27/2018),  (07/28/2018),  (07/29/2018),  (07/30/2018),  (08/23/2018),  (08/24/2018),  (08/25/2018),  (08/26/2018),  (08/27/2018),  (09/20/2018),  (09/21/2018),  (09/22/2018),  (09/23/2018),  (09/24/2018),  (10/25/2018),  (10/26/2018),  (10/27/2018),  (10/28/2018),  (10/29/2018),  (11/22/2018),  (11/23/2018),  (11/24/2018),  (11/25/2018),  (11/26/2018),  (12/20/2018),  (12/21/2018),  (12/22/2018),  (12/23/2018),  (12/24/2018),  (01/17/2019),  (01/18/2019),  (01/19/2019),  (01/20/2019),  (01/21/2019),  (02/14/2019),  (02/15/2019),  (02/16/2019),  (02/17/2019),  (02/18/2019),  (03/14/2019),  (03/15/2019),  (03/16/2019),  (03/17/2019),  (03/18/2019),  (04/11/2019),  (04/12/2019),  (04/13/2019),  (04/14/2019),  (04/15/2019),  (05/09/2019),  (05/10/2019),  (05/11/2019),  (05/12/2019),  (05/13/2019),  (06/13/2019),  (06/14/2019),  (06/15/2019),  (06/16/2019),  (06/17/2019),  (07/18/2019),  (07/19/2019),  (07/20/2019),  (07/21/2019),  (07/22/2019),  (08/15/2019),  (08/16/2019),  (08/17/2019),  (08/18/2019),  (08/19/2019),  (09/12/2019),  (09/13/2019),  (09/14/2019),  (09/15/2019),  (09/16/2019),  (10/17/2019),  (10/18/2019),  (10/19/2019),  (10/20/2019),  (10/21/2019),  (11/14/2019),  (11/15/2019),  (11/16/2019),  (11/17/2019),  (11/18/2019)  for chemotherapy treatment.    12/22/2019 -  Chemotherapy   The patient had palonosetron (ALOXI) injection 0.25 mg, 0.25 mg, Intravenous,  Once, 1 of 4 cycles Administration: 0.25 mg (12/22/2019) lurbinectedin (ZEPZELCA) 5.8 mg in sodium chloride 0.9 % 250 mL chemo infusion, 3.2 mg/m2 = 5.8 mg, Intravenous,  Once, 1 of 4 cycles Administration: 5.8 mg (12/22/2019)  for chemotherapy treatment.      CANCER STAGING: Cancer Staging No matching staging information was found for the patient.  INTERVAL HISTORY:  [name], a 69 y.o.  male, returns for routine follow-up of his SCLC with liver metastasis. [name] was last seen on 12/19/2019.  Today [name] reports that [name] had 4 days of constipation and had a BM yesterday and today after taking a laxative. [name] has been  tolerating the new medications well. His back pain is stable; [name] takes just 1 tablet of Percocet daily. His appetite is not good and [name] denies feeling hungry; [name] drinks homemade plant-based protein shakes daily. [name] has been plant-based for the past 2 years. His cough is not causing pain in his neck now.    REVIEW OF SYSTEMS:  Review of Systems  Constitutional: Positive for appetite change (severely decreased) and fatigue (moderate).  Respiratory: Positive for cough.   Gastrointestinal: Positive for constipation.  Neurological: Positive for dizziness and numbness (feet).  Psychiatric/Behavioral: Positive for sleep disturbance.  All other systems reviewed and are negative.   PAST MEDICAL/SURGICAL HISTORY:  Past Medical History:  Diagnosis Date  . Anxiety   . CAD (coronary artery disease)   . Cancer ([place])    stage 4 small cell lung cancer   . COPD (chronic obstructive pulmonary disease) ([place])   . Depression   . Dyspnea    increased exertion  . Feeling of chest tightness   . Heart palpitations   . History of chemotherapy   . Myocardial infarction ([place])   . Osteopenia   . Panic attacks   . Smoker    Past Surgical History:  Procedure Laterality Date  . BACK SURGERY  12/24/2000   L5,S1  . CORONARY STENT PLACEMENT  2005   RCA & CX  . HERNIA REPAIR Right 1980's  . INGUINAL HERNIA REPAIR  12/1978   right side  . IR FLUORO GUIDE PORT INSERTION RIGHT  04/02/2017  . IR US GUIDE BX ASP/DRAIN  02/03/2017  . IR US GUIDE VASC ACCESS RIGHT  04/02/2017  . NM MYOCAR PERF WALL MOTION  09/07/2009   No ischemia; EF 51%  . SHOULDER SURGERY Left 08/2010  . SPINE SURGERY  2002   L5-S1    SOCIAL HISTORY:  Social History   Socioeconomic History  . Marital status: Single     Spouse name: Not on file  . Number of children: Not on file  . Years of education: Not on file  . Highest education level: Not on file  Occupational History  . Not on file  Tobacco Use  . Smoking status: Current Every Day Smoker    Packs/day: 1.00    Types: Cigarettes  . Smokeless tobacco: Never Used  Vaping Use  . Vaping Use: Never used  Substance and Sexual Activity  . Alcohol use: Yes    Comment: occas  . Drug use: No  . Sexual activity: Not on file  Other Topics Concern  . Not on file  Social History Narrative  . Not on file   Social Determinants of Health   Financial Resource Strain:   . Difficulty of Paying Living Expenses:   Food Insecurity:   . Worried About Charity fundraiser in the Last Year:   . Arboriculturist in the Last Year:   Transportation Needs:   . Film/video editor (Medical):   Marland Kitchen Lack of Transportation (Non-Medical):   Physical Activity:   . Days of Exercise per Week:   . Minutes of Exercise per Session:   Stress:   . Feeling of Stress :   Social Connections:   . Frequency of Communication with Friends and Family:   . Frequency of Social Gatherings with Friends and Family:   . Attends Religious Services:   . Active Member of Clubs or Organizations:   . Attends Archivist Meetings:   Marland Kitchen Marital Status:   Intimate Production manager  Violence:   . Fear of Current or Ex-Partner:   . Emotionally Abused:   Marland Kitchen Physically Abused:   . Sexually Abused:     FAMILY HISTORY:  Family History  Problem Relation Age of Onset  . Heart attack Father   . Kidney disease Father        renal failure  . Heart failure Mother   . Heart attack Mother   . Cancer Brother   . Diabetes Brother   . Alcohol abuse Brother   . Diabetes Sister     CURRENT MEDICATIONS:  Current Outpatient Medications  Medication Sig Dispense Refill  . ALPRAZolam (XANAX) 1 MG tablet TAKE (1) TABLET BY MOUTH (4) TIMES DAILY. 120 tablet 0  . Ascorbic Acid (VITAMIN C) 1000 MG  tablet Take 1,000 mg by mouth daily.    Marland Kitchen aspirin EC 81 MG tablet Take 81 mg by mouth daily.    Marland Kitchen atorvastatin (LIPITOR) 40 MG tablet TAKE (1) TABLET BY MOUTH ONCE DAILY. 90 tablet 1  . Bioflavonoid Products (ESTER C PO) Take 1 tablet by mouth daily.    . calcium-vitamin D (OSCAL WITH D) 250-125 MG-UNIT tablet Take 1 tablet by mouth daily.    . cholecalciferol (VITAMIN D3) 25 MCG (1000 UT) tablet Take 1,000 Units by mouth daily.    . clotrimazole-betamethasone (LOTRISONE) cream Apply 1 application topically 2 (two) times daily. 30 g 0  . gabapentin (NEURONTIN) 300 MG capsule Take 2 capsules (600 mg total) by mouth 3 (three) times daily. 180 capsule 3  . Melatonin 10 MG TABS Take 1 capsule by mouth every evening.    . Multiple Vitamins-Minerals (CENTRUM SILVER 50+MEN) TABS Take 1 tablet by mouth daily.    Marland Kitchen oxyCODONE-acetaminophen (PERCOCET/ROXICET) 5-325 MG tablet TAKE 1 TABLET EVERY 8 HOURS AS NEEDED FOR SEVERE PAIN. 90 tablet 0  . Pegfilgrastim (NEULASTA ONPRO [place]) Inject into the skin. Every 21 days    . polyethylene glycol powder (GLYCOLAX/MIRALAX) powder Take 17 g by mouth daily. 3350 g 2  . prochlorperazine (COMPAZINE) 10 MG tablet Take 1 tablet (10 mg total) by mouth every 6 (six) hours as needed (Nausea or vomiting). 60 tablet 1  . senna (SENOKOT) 8.6 MG tablet Take 1 tablet by mouth daily.    . topotecan in sodium chloride 0.9 % 100 mL Inject into the vein once. Days 1-5 every 21 days    . vitamin B-12 (CYANOCOBALAMIN) 100 MCG tablet Take 100 mcg by mouth daily.    Marland Kitchen albuterol (VENTOLIN HFA) 108 (90 Base) MCG/ACT inhaler Inhale 2 puffs into the lungs every 6 (six) hours as needed for wheezing or shortness of breath. (Patient not taking: Reported on 12/29/2019)    . hydrOXYzine (ATARAX/VISTARIL) 50 MG tablet Take 1 tablet (50 mg total) by mouth at bedtime as needed. for sleep (Patient not taking: Reported on 12/29/2019) 90 tablet 0  . ondansetron (ZOFRAN) 8 MG tablet Take 1 tablet (8 mg  total) by mouth 2 (two) times daily as needed. Start on the third day after cisplatin chemotherapy. (Patient not taking: Reported on 12/29/2019) 30 tablet 1   No current facility-administered medications for this visit.    ALLERGIES:  Allergies  Allergen Reactions  . Codeine Nausea Only  . Niaspan [Niacin Er]     PHYSICAL EXAM:  Performance status (ECOG): 1 - Symptomatic but completely ambulatory  Vitals:   12/29/19 0858  BP: 129/61  Pulse: 71  Resp: 18  Temp: (!) 97.5 F (36.4 C)  SpO2: 91%  Wt Readings from Last 3 Encounters:  12/29/19 151 lb 12.8 oz (68.9 kg)  12/19/19 150 lb 3.2 oz (68.1 kg)  11/14/19 152 lb (68.9 kg)   Physical Exam Vitals reviewed.  Constitutional:      Appearance: Normal appearance.  Cardiovascular:     Rate and Rhythm: Normal rate and regular rhythm.     Pulses: Normal pulses.     Heart sounds: Normal heart sounds.  Pulmonary:     Effort: Pulmonary effort is normal.     Breath sounds: Normal breath sounds.  Abdominal:     Palpations: Abdomen is soft. There is no hepatomegaly, splenomegaly or mass.     Tenderness: There is no abdominal tenderness.     Hernia: No hernia is present.  Musculoskeletal:     Right lower leg: No edema.     Left lower leg: No edema.  Lymphadenopathy:     Cervical: No cervical adenopathy.     Right cervical: No superficial or deep cervical adenopathy.    Left cervical: No superficial or deep cervical adenopathy.     Upper Body:     Right upper body: No supraclavicular adenopathy.     Left upper body: No supraclavicular adenopathy.     Lower Body: No right inguinal adenopathy. No left inguinal adenopathy.  Neurological:     General: No focal deficit present.     Mental Status: [name] is alert and oriented to person, place, and time.  Psychiatric:        Mood and Affect: Mood normal.        Behavior: Behavior normal.      LABORATORY DATA:  I have reviewed the labs as listed.  CBC Latest Ref Rng & Units  12/19/2019 11/14/2019 10/17/2019  WBC 4.0 - 10.5 K/uL 7.2 6.6 6.7  Hemoglobin 13.0 - 17.0 g/dL 10.0(L) 8.8(L) 9.0(L)  Hematocrit 39 - 52 % 31.3(L) 27.8(L) 28.7(L)  Platelets 150 - 400 K/uL 247 382 234   CMP Latest Ref Rng & Units 12/29/2019 12/19/2019 12/15/2019  Glucose 70 - 99 mg/dL 124(H) 100(H) -  BUN 8 - 23 mg/dL 23 25(H) -  Creatinine 0.61 - 1.24 mg/dL 0.88 0.83 1.00  Sodium 135 - 145 mmol/L 137 136 -  Potassium 3.5 - 5.1 mmol/L 3.9 3.9 -  Chloride 98 - 111 mmol/L 103 104 -  CO2 22 - 32 mmol/L 26 24 -  Calcium 8.9 - 10.3 mg/dL 8.5(L) 8.8(L) -  Total Protein 6.5 - 8.1 g/dL 6.5 6.6 -  Total Bilirubin 0.3 - 1.2 mg/dL 0.4 0.5 -  Alkaline Phos 38 - 126 U/L 52 52 -  AST 15 - 41 U/L 20 19 -  ALT 0 - 44 U/L 23 15 -   Lab Results  Component Value Date   LDH 139 12/29/2019   LDH 139 11/14/2019   LDH 147 06/13/2019    DIAGNOSTIC IMAGING:  I have independently reviewed the scans and discussed with the patient. CT SOFT TISSUE NECK W CONTRAST  Result Date: 12/16/2019 CLINICAL DATA:  Metastatic lung cancer with right-sided neck pain EXAM: CT NECK WITH CONTRAST TECHNIQUE: Multidetector CT imaging of the neck was performed using the standard protocol following the bolus administration of intravenous contrast. CONTRAST:  133m OMNIPAQUE IOHEXOL 300 MG/ML  SOLN COMPARISON:  01/16/2017 FINDINGS: Pharynx and larynx: No evidence of mass or swelling. Salivary glands: No inflammation, mass, or stone. Thyroid: Normal. Lymph nodes: No recent neck imaging for comparison purposes (most recent available is a 08/13/2017 PET CT).  There is a cluster of enlarged and spiculated lymph nodes in the lower right posterior triangle, the largest measuring 24 mm on 2:75. There is also lower right jugular lymphadenopathy with a 29 mm node seen on 2:81, exerting mild mass effect on the lower internal jugular vein. Thoracic adenopathy as described on dedicated chest CT. Vascular: Atherosclerosis without least moderate proximal right  ICA narrowing. Limited intracranial: Negative Visualized orbits: Negative Mastoids and visualized paranasal sinuses: Clear Skeleton: No acute or aggressive finding. Upper chest: Reported on dedicated chest CT.  Biapical emphysema. IMPRESSION: Malignant lymphadenopathy in the lower right posterior triangle and jugular chain as described. This is recurrent disease based on a January 2019 PET-CT (the most recent available which covers the neck). Electronically Signed   By: Monte Fantasia M.D.   On: 12/16/2019 05:53   CT Chest W Contrast  Result Date: 12/15/2019 CLINICAL DATA:  Small cell lung cancer with liver metastasis status post radiation. EXAM: CT CHEST, ABDOMEN, AND PELVIS WITH CONTRAST TECHNIQUE: Multidetector CT imaging of the chest, abdomen and pelvis was performed following the standard protocol during bolus administration of intravenous contrast. CONTRAST:  176m OMNIPAQUE IOHEXOL 300 MG/ML  SOLN COMPARISON:  09/07/2019 FINDINGS: CT CHEST FINDINGS Cardiovascular: Calcified and noncalcified plaque in the thoracic aorta with similar appearance to the prior exam. No is sign of aneurysmal dilation of the thoracic aorta. Calcified coronary artery disease as before. Heart size stable without pericardial effusion. Central pulmonary vasculature is unremarkable on venous phase assessment. Mediastinum/Nodes: Enlarging prevascular lymph node (image 16, series 2) 1.6 cm, previously 0.8 cm High RIGHT paratracheal lymph node just below the thoracic inlet (image 7, series 2) 1 cm, previously 8-9 mm. Cystic area in the anterior mediastinum unchanged (image 9, series 2 approximately 1.1 cm. Small AP window lymph node just less than a cm is slightly more rounded than on previous imaging. No hilar adenopathy. No axillary adenopathy. RIGHT-sided Port-A-Cath terminates at the caval to atrial junction. No thoracic inlet adenopathy. Lungs/Pleura: Marked pulmonary emphysema. Scarring of medial aspect of the RIGHT middle lobe.  Small nodules in the LEFT lower lobe (image 113, series 3) 8 mm nodule unchanged. Adjacent nodule on image 111 of series 3 is also stable at approximately 6 mm, difficult to separate from surrounding bronchovascular structures. No consolidation. No pleural effusion. Musculoskeletal: No chest wall mass. CT ABDOMEN PELVIS FINDINGS Hepatobiliary: Lobular hepatic contours. No focal, suspicious hepatic lesion. Mild biliary ductal distension, intrahepatic biliary tree is stable. No pericholecystic stranding. Pancreas: Pancreas is normal without focal lesion, ductal dilation or inflammation. Spleen: Spleen normal size without focal lesion. Adrenals/Urinary Tract: Adrenal glands are normal. Low-density lesions in the bilateral kidneys are unchanged cyst in the upper pole of the LEFT kidney and other small renal lesions also likely cysts. Stomach/Bowel: No acute gastrointestinal process. The appendix is normal. Diverticular disease of the sigmoid colon. Vascular/Lymphatic: Calcific atheromatous plaque and noncalcified plaque of the abdominal aorta is unchanged in there is no evidence of aneurysm. No adenopathy in the retroperitoneum or upper abdomen. No pelvic lymphadenopathy. Reproductive: Prostate unremarkable.  Urinary bladder decompressed. Other: No ascites or free air. Musculoskeletal: No acute musculoskeletal process. No destructive bone finding. IMPRESSION: 1. Enlarging prevascular and high RIGHT paratracheal lymph nodes, concerning for metastatic disease. 2. Stable left lower lobe pulmonary nodules. 3. No evidence of metastatic disease in the abdomen or pelvis. 4. Emphysema and aortic atherosclerosis. Aortic Atherosclerosis (ICD10-I70.0) and Emphysema (ICD10-J43.9). Electronically Signed   By: GZetta BillsM.D.   On: 12/15/2019 17:32  CT Abdomen Pelvis W Contrast  Result Date: 12/15/2019 CLINICAL DATA:  Small cell lung cancer with liver metastasis status post radiation. EXAM: CT CHEST, ABDOMEN, AND PELVIS WITH  CONTRAST TECHNIQUE: Multidetector CT imaging of the chest, abdomen and pelvis was performed following the standard protocol during bolus administration of intravenous contrast. CONTRAST:  17m OMNIPAQUE IOHEXOL 300 MG/ML  SOLN COMPARISON:  09/07/2019 FINDINGS: CT CHEST FINDINGS Cardiovascular: Calcified and noncalcified plaque in the thoracic aorta with similar appearance to the prior exam. No is sign of aneurysmal dilation of the thoracic aorta. Calcified coronary artery disease as before. Heart size stable without pericardial effusion. Central pulmonary vasculature is unremarkable on venous phase assessment. Mediastinum/Nodes: Enlarging prevascular lymph node (image 16, series 2) 1.6 cm, previously 0.8 cm High RIGHT paratracheal lymph node just below the thoracic inlet (image 7, series 2) 1 cm, previously 8-9 mm. Cystic area in the anterior mediastinum unchanged (image 9, series 2 approximately 1.1 cm. Small AP window lymph node just less than a cm is slightly more rounded than on previous imaging. No hilar adenopathy. No axillary adenopathy. RIGHT-sided Port-A-Cath terminates at the caval to atrial junction. No thoracic inlet adenopathy. Lungs/Pleura: Marked pulmonary emphysema. Scarring of medial aspect of the RIGHT middle lobe. Small nodules in the LEFT lower lobe (image 113, series 3) 8 mm nodule unchanged. Adjacent nodule on image 111 of series 3 is also stable at approximately 6 mm, difficult to separate from surrounding bronchovascular structures. No consolidation. No pleural effusion. Musculoskeletal: No chest wall mass. CT ABDOMEN PELVIS FINDINGS Hepatobiliary: Lobular hepatic contours. No focal, suspicious hepatic lesion. Mild biliary ductal distension, intrahepatic biliary tree is stable. No pericholecystic stranding. Pancreas: Pancreas is normal without focal lesion, ductal dilation or inflammation. Spleen: Spleen normal size without focal lesion. Adrenals/Urinary Tract: Adrenal glands are normal.  Low-density lesions in the bilateral kidneys are unchanged cyst in the upper pole of the LEFT kidney and other small renal lesions also likely cysts. Stomach/Bowel: No acute gastrointestinal process. The appendix is normal. Diverticular disease of the sigmoid colon. Vascular/Lymphatic: Calcific atheromatous plaque and noncalcified plaque of the abdominal aorta is unchanged in there is no evidence of aneurysm. No adenopathy in the retroperitoneum or upper abdomen. No pelvic lymphadenopathy. Reproductive: Prostate unremarkable.  Urinary bladder decompressed. Other: No ascites or free air. Musculoskeletal: No acute musculoskeletal process. No destructive bone finding. IMPRESSION: 1. Enlarging prevascular and high RIGHT paratracheal lymph nodes, concerning for metastatic disease. 2. Stable left lower lobe pulmonary nodules. 3. No evidence of metastatic disease in the abdomen or pelvis. 4. Emphysema and aortic atherosclerosis. Aortic Atherosclerosis (ICD10-I70.0) and Emphysema (ICD10-J43.9). Electronically Signed   By: GZetta BillsM.D.   On: 12/15/2019 17:32     ASSESSMENT:  1. 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 -CT CAP on 12/15/2019 showed enlarging prevascular and right paratracheal lymph nodes.  Stable left lower lobe lung nodules. -CT neck on 12/15/2019 showed malignant lymphadenopathy in the lower right posterior triangle and jugular chain. - Lurbinectidin 3.271mkg started on 12/22/2019.   PLAN:  1. 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  Did not have any major side effects including nausea or diarrhea. -[name] had some constipation.  [name] has noticed mild improvement in the right neck lymph nodes. -I have reviewed his labs.  Platelets are 98.  Hemoglobin is slightly down to 9.3.  Rest of the blood work was grossly within normal limits. -[name]  will come back in 2 weeks to  initiate his second cycle.  2. Normocytic anemia: -This is from myelosuppression from chemotherapy.  Hemoglobin today is 9.3. -Ferritin was 191 and percent saturation was 11 on 12/19/2019.  Folic acid and M38 was normal.  3. Chronic pain: -Continue Percocet 1 tablet daily as needed.  If [name] takes more [name] will get constipated.  4. Peripheral neuropathy: -Continue gabapentin 3 times a day, 600 mg.   Orders placed this encounter:  No orders of the defined types were placed in this encounter.    Derek Jack, MD [place] [phone]   I, Milinda Antis, am acting as a scribe for Dr. Sanda Linger.  I, Derek Jack MD, have reviewed the above documentation for accuracy and completeness, and I agree with the above.

## 2020-01-06 ENCOUNTER — Other Ambulatory Visit: Payer: Self-pay | Admitting: Family Medicine

## 2020-01-06 DIAGNOSIS — F5101 Primary insomnia: Secondary | ICD-10-CM

## 2020-01-06 DIAGNOSIS — F411 Generalized anxiety disorder: Secondary | ICD-10-CM

## 2020-01-06 NOTE — Telephone Encounter (Signed)
Ok to refill??  Last office visit 12/28/2018.  Last refill 11/11/2019.   Of note, letter sent to patient to schedule OV.

## 2020-01-13 ENCOUNTER — Inpatient Hospital Stay (HOSPITAL_BASED_OUTPATIENT_CLINIC_OR_DEPARTMENT_OTHER): Payer: Medicare HMO | Admitting: Oncology

## 2020-01-13 ENCOUNTER — Other Ambulatory Visit: Payer: Self-pay

## 2020-01-13 ENCOUNTER — Inpatient Hospital Stay (HOSPITAL_COMMUNITY): Payer: Medicare HMO | Attending: Hematology

## 2020-01-13 VITALS — BP 116/48 | HR 52 | Temp 97.5°F | Resp 18 | Wt 147.3 lb

## 2020-01-13 VITALS — BP 123/55 | HR 58 | Temp 97.6°F | Resp 18

## 2020-01-13 DIAGNOSIS — F17210 Nicotine dependence, cigarettes, uncomplicated: Secondary | ICD-10-CM | POA: Diagnosis not present

## 2020-01-13 DIAGNOSIS — R683 Clubbing of fingers: Secondary | ICD-10-CM | POA: Diagnosis not present

## 2020-01-13 DIAGNOSIS — C787 Secondary malignant neoplasm of liver and intrahepatic bile duct: Secondary | ICD-10-CM | POA: Insufficient documentation

## 2020-01-13 DIAGNOSIS — C3490 Malignant neoplasm of unspecified part of unspecified bronchus or lung: Secondary | ICD-10-CM

## 2020-01-13 DIAGNOSIS — D508 Other iron deficiency anemias: Secondary | ICD-10-CM

## 2020-01-13 DIAGNOSIS — F17200 Nicotine dependence, unspecified, uncomplicated: Secondary | ICD-10-CM | POA: Diagnosis not present

## 2020-01-13 DIAGNOSIS — C3432 Malignant neoplasm of lower lobe, left bronchus or lung: Secondary | ICD-10-CM | POA: Diagnosis not present

## 2020-01-13 DIAGNOSIS — Z5111 Encounter for antineoplastic chemotherapy: Secondary | ICD-10-CM | POA: Insufficient documentation

## 2020-01-13 DIAGNOSIS — C770 Secondary and unspecified malignant neoplasm of lymph nodes of head, face and neck: Secondary | ICD-10-CM | POA: Diagnosis not present

## 2020-01-13 DIAGNOSIS — J438 Other emphysema: Secondary | ICD-10-CM

## 2020-01-13 DIAGNOSIS — C3431 Malignant neoplasm of lower lobe, right bronchus or lung: Secondary | ICD-10-CM | POA: Diagnosis not present

## 2020-01-13 LAB — CBC WITH DIFFERENTIAL/PLATELET
Abs Immature Granulocytes: 0.02 10*3/uL (ref 0.00–0.07)
Basophils Absolute: 0 10*3/uL (ref 0.0–0.1)
Basophils Relative: 1 %
Eosinophils Absolute: 0.1 10*3/uL (ref 0.0–0.5)
Eosinophils Relative: 3 %
HCT: 31.6 % — ABNORMAL LOW (ref 39.0–52.0)
Hemoglobin: 9.9 g/dL — ABNORMAL LOW (ref 13.0–17.0)
Immature Granulocytes: 0 %
Lymphocytes Relative: 25 %
Lymphs Abs: 1.3 10*3/uL (ref 0.7–4.0)
MCH: 30.7 pg (ref 26.0–34.0)
MCHC: 31.3 g/dL (ref 30.0–36.0)
MCV: 98.1 fL (ref 80.0–100.0)
Monocytes Absolute: 0.7 10*3/uL (ref 0.1–1.0)
Monocytes Relative: 15 %
Neutro Abs: 2.9 10*3/uL (ref 1.7–7.7)
Neutrophils Relative %: 56 %
Platelets: 226 10*3/uL (ref 150–400)
RBC: 3.22 MIL/uL — ABNORMAL LOW (ref 4.22–5.81)
RDW: 20.6 % — ABNORMAL HIGH (ref 11.5–15.5)
WBC: 5.1 10*3/uL (ref 4.0–10.5)
nRBC: 0 % (ref 0.0–0.2)

## 2020-01-13 LAB — COMPREHENSIVE METABOLIC PANEL
ALT: 16 U/L (ref 0–44)
AST: 17 U/L (ref 15–41)
Albumin: 4 g/dL (ref 3.5–5.0)
Alkaline Phosphatase: 53 U/L (ref 38–126)
Anion gap: 8 (ref 5–15)
BUN: 27 mg/dL — ABNORMAL HIGH (ref 8–23)
CO2: 26 mmol/L (ref 22–32)
Calcium: 8.8 mg/dL — ABNORMAL LOW (ref 8.9–10.3)
Chloride: 104 mmol/L (ref 98–111)
Creatinine, Ser: 0.96 mg/dL (ref 0.61–1.24)
GFR calc Af Amer: >60 mL/min (ref >60)
GFR calc non Af Amer: >60 mL/min (ref >60)
Glucose, Bld: 107 mg/dL — ABNORMAL HIGH (ref 70–99)
Potassium: 4 mmol/L (ref 3.5–5.1)
Sodium: 138 mmol/L (ref 135–145)
Total Bilirubin: 0.5 mg/dL (ref 0.3–1.2)
Total Protein: 6.5 g/dL (ref 6.5–8.1)

## 2020-01-13 LAB — LACTATE DEHYDROGENASE: LDH: 135 U/L (ref 98–192)

## 2020-01-13 LAB — MAGNESIUM: Magnesium: 2.1 mg/dL (ref 1.7–2.4)

## 2020-01-13 MED ORDER — PALONOSETRON HCL INJECTION 0.25 MG/5ML
0.2500 mg | Freq: Once | INTRAVENOUS | Status: AC
  Administered 2020-01-13: 0.25 mg via INTRAVENOUS
  Filled 2020-01-13: qty 5

## 2020-01-13 MED ORDER — SODIUM CHLORIDE 0.9 % IV SOLN: INTRAVENOUS | Status: AC

## 2020-01-13 MED ORDER — SODIUM CHLORIDE 0.9 % IV SOLN
10.0000 mg | Freq: Once | INTRAVENOUS | Status: AC
  Administered 2020-01-13: 10 mg via INTRAVENOUS
  Filled 2020-01-13: qty 10

## 2020-01-13 MED ORDER — SODIUM CHLORIDE 0.9 % IV SOLN
3.2000 mg/m2 | Freq: Once | INTRAVENOUS | Status: AC
  Administered 2020-01-13: 5.8 mg via INTRAVENOUS
  Filled 2020-01-13: qty 11.6

## 2020-01-13 MED ORDER — SODIUM CHLORIDE 0.9% FLUSH
10.0000 mL | INTRAVENOUS | Status: DC | PRN
  Administered 2020-01-13: 10 mL

## 2020-01-13 MED ORDER — HEPARIN SOD (PORK) LOCK FLUSH 100 UNIT/ML IV SOLN
500.0000 [IU] | Freq: Once | INTRAVENOUS | Status: AC | PRN
  Administered 2020-01-13: 500 [IU]

## 2020-01-13 MED ORDER — SODIUM CHLORIDE 0.9 % IV SOLN: Freq: Once | INTRAVENOUS | Status: AC

## 2020-01-13 NOTE — Progress Notes (Signed)
Patient has been assessed, vital signs and labs have been reviewed by Kirby Crigler PA. ANC, Creatinine, LFTs, and Platelets are within treatment parameters per Kirby Crigler PA. The patient is good to proceed with treatment at this time. Please give additional 549mL NS per Kirby Crigler PA.

## 2020-01-13 NOTE — Progress Notes (Signed)
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 Staging PET scan on 02/02/2018 demonstrated hypermetabolic right cervical, bilateral supraclavicular, mediastinal, right hilar, and right internal mammary lymphadenopathy with hypermetabolic right liver lesion. Baseline MRI brain on 02/11/2017 was negative for intracranial disease. He is S/P Cisplatin/Etoposide x 6 cycles (02/09/2017- 05/27/2017). Unfortunately, he had a quick treatment failure on PET scan on 08/13/2017. He was therefore started on second-line therapy with Topotecan beginning on 08/24/2017- 11/14/2019. Postive response to therapy noted on 02/2018 scans.  Progression of disease noted on 12/15/2019 CT scans leading to a change in therapy to Lurbinectidin beginning on 12/22/2019  CURRENT STATUS: [name] 69 y.o. male returns for followup of in follow-up of small cell lung cancer for which he is on salvage Lurbinectidin.  He tolerated his first cycle well with a 24-28 hr history of fatigue and tiredness.  This occurred well past his nadir so I am not convinced this is related to his treatment at this time.  He reports feeling well today and yesterday.  He continues to smoke 1 ppd.  No new pain.  No lumps or bumps on his own examination.  No new cough or hemoptysis.  He denies any new neurological deficits including HA, dizziness, 2x vision, LOC, and seizure.  He reports a great clinical response following his first cycle of therapy with significant decrease in right anterior cervical lymph node.  Review of Systems  Constitutional: Negative.   Negative for chills, fever and weight loss.  HENT: Negative.   Eyes: Negative.   Respiratory: Negative.  Negative for cough.   Cardiovascular: Negative.  Negative for chest pain.  Gastrointestinal: Negative.  Negative for blood in stool, constipation, diarrhea, melena, nausea and vomiting.  Genitourinary: Negative.   Musculoskeletal: Negative.   Skin: Negative.   Neurological: Negative.  Negative for weakness.  Endo/Heme/Allergies: Negative.   Psychiatric/Behavioral: Negative.     Past Medical History:  Diagnosis Date  . Anxiety   . CAD (coronary artery disease)   . Cancer ([place])    stage 4 small cell lung cancer   . COPD (chronic obstructive pulmonary disease) ([place])   . Depression   . Dyspnea    increased exertion  . Feeling of chest tightness   . Heart palpitations   . History of chemotherapy   . Myocardial infarction ([place])   . Osteopenia   . Panic attacks   . Smoker      PHYSICAL EXAMINATION  ECOG PERFORMANCE STATUS: 1 - Symptomatic but completely ambulatory  Vitals:   01/13/20 1024  BP: (!) 116/48  Pulse: (!) 52  Resp: 18  Temp: (!) 97.5 F (36.4 C)  SpO2: 98%    GENERAL:alert, no distress, comfortable, cooperative, smiling and unaccompanied, appearing older than stated age. SKIN: skin color, texture, turgor are normal, no rashes or significant lesions HEAD: Normocephalic, No masses, lesions, tenderness or abnormalities EYES: normal, EOMI EARS: External ears normal OROPHARYNX:lips, buccal mucosa, and tongue normal  NECK: supple LYMPH:  no palpable lymphadenopathy BREAST:not examined LUNGS: decreased breath sounds HEART: regular rate & rhythm ABDOMEN:abdomen soft and normal bowel sounds  BACK: Back symmetric, no curvature. EXTREMITIES:less then 2 second capillary refill, no skin discoloration, no cyanosis, positive findings:  Clubbing of fingernails.  NEURO: alert & oriented x 3 with fluent speech, no focal motor/sensory deficits   LABORATORY  DATA: CBC    Component Value Date/Time   WBC 5.1 01/13/2020 0951   RBC 3.22 (L) 01/13/2020 0951   HGB 9.9 (L) 01/13/2020 0951   HCT 31.6 (L) 01/13/2020 0951   PLT 226 01/13/2020 0951   MCV 98.1 01/13/2020 0951   MCH 30.7 01/13/2020 0951   MCHC 31.3 01/13/2020 0951   RDW 20.6 (H) 01/13/2020 0951   LYMPHSABS 1.3 01/13/2020 0951   MONOABS 0.7 01/13/2020 0951   EOSABS 0.1 01/13/2020 0951   BASOSABS 0.0 01/13/2020 0951      Chemistry      Component Value Date/Time   NA 138 01/13/2020 0951   K 4.0 01/13/2020 0951   CL 104 01/13/2020 0951   CO2 26 01/13/2020 0951   BUN 27 (H) 01/13/2020 0951   CREATININE 0.96 01/13/2020 0951   CREATININE 0.89 09/16/2018 1031      Component Value Date/Time   CALCIUM 8.8 (L) 01/13/2020 0951   ALKPHOS 53 01/13/2020 0951   AST 17 01/13/2020 0951   ALT 16 01/13/2020 0951   BILITOT 0.5 01/13/2020 0951       RADIOGRAPHIC STUDIES:  CT SOFT TISSUE NECK W CONTRAST  Result Date: 12/16/2019 CLINICAL DATA:  Metastatic lung cancer with right-sided neck pain EXAM: CT NECK WITH CONTRAST TECHNIQUE: Multidetector CT imaging of the neck was performed using the standard protocol following the bolus administration of intravenous contrast. CONTRAST:  152mL OMNIPAQUE IOHEXOL 300 MG/ML  SOLN COMPARISON:  01/16/2017 FINDINGS: Pharynx and larynx: No evidence of mass or swelling. Salivary glands: No inflammation, mass, or stone. Thyroid: Normal. Lymph nodes: No recent neck imaging for comparison purposes (most recent available is a 08/13/2017 PET CT). There is a cluster of enlarged and spiculated lymph nodes in the lower right posterior triangle, the largest measuring 24 mm on 2:75. There is also lower right jugular lymphadenopathy with a 29 mm node seen on 2:81, exerting mild mass effect on the lower internal jugular vein. Thoracic adenopathy as described on dedicated chest CT. Vascular: Atherosclerosis without least moderate proximal right ICA narrowing. Limited  intracranial: Negative Visualized orbits: Negative Mastoids and visualized paranasal sinuses: Clear Skeleton: No acute or aggressive finding. Upper chest: Reported on dedicated chest CT.  Biapical emphysema. IMPRESSION: Malignant lymphadenopathy in the lower right posterior triangle and jugular chain as described. This is recurrent disease based on a January 2019 PET-CT (the most recent available which covers the neck). Electronically Signed   By: Monte Fantasia M.D.   On: 12/16/2019 05:53   CT Chest W Contrast  Result Date: 12/15/2019 CLINICAL DATA:  Small cell lung cancer with liver metastasis status post radiation. EXAM: CT CHEST, ABDOMEN, AND PELVIS WITH CONTRAST TECHNIQUE: Multidetector CT imaging of the chest, abdomen and pelvis was performed following the standard protocol during bolus administration of intravenous contrast. CONTRAST:  172mL OMNIPAQUE IOHEXOL 300 MG/ML  SOLN COMPARISON:  09/07/2019 FINDINGS: CT CHEST FINDINGS Cardiovascular: Calcified and noncalcified plaque in the thoracic aorta with similar appearance to the prior exam. No is sign of aneurysmal dilation of the thoracic aorta. Calcified coronary artery disease as before. Heart size stable without pericardial effusion. Central pulmonary vasculature is unremarkable on venous phase assessment. Mediastinum/Nodes: Enlarging prevascular lymph node (image 16, series 2) 1.6 cm, previously 0.8 cm High RIGHT  paratracheal lymph node just below the thoracic inlet (image 7, series 2) 1 cm, previously 8-9 mm. Cystic area in the anterior mediastinum unchanged (image 9, series 2 approximately 1.1 cm. Small AP window lymph node just less than a cm is slightly more rounded than on previous imaging. No hilar adenopathy. No axillary adenopathy. RIGHT-sided Port-A-Cath terminates at the caval to atrial junction. No thoracic inlet adenopathy. Lungs/Pleura: Marked pulmonary emphysema. Scarring of medial aspect of the RIGHT middle lobe. Small nodules in the  LEFT lower lobe (image 113, series 3) 8 mm nodule unchanged. Adjacent nodule on image 111 of series 3 is also stable at approximately 6 mm, difficult to separate from surrounding bronchovascular structures. No consolidation. No pleural effusion. Musculoskeletal: No chest wall mass. CT ABDOMEN PELVIS FINDINGS Hepatobiliary: Lobular hepatic contours. No focal, suspicious hepatic lesion. Mild biliary ductal distension, intrahepatic biliary tree is stable. No pericholecystic stranding. Pancreas: Pancreas is normal without focal lesion, ductal dilation or inflammation. Spleen: Spleen normal size without focal lesion. Adrenals/Urinary Tract: Adrenal glands are normal. Low-density lesions in the bilateral kidneys are unchanged cyst in the upper pole of the LEFT kidney and other small renal lesions also likely cysts. Stomach/Bowel: No acute gastrointestinal process. The appendix is normal. Diverticular disease of the sigmoid colon. Vascular/Lymphatic: Calcific atheromatous plaque and noncalcified plaque of the abdominal aorta is unchanged in there is no evidence of aneurysm. No adenopathy in the retroperitoneum or upper abdomen. No pelvic lymphadenopathy. Reproductive: Prostate unremarkable.  Urinary bladder decompressed. Other: No ascites or free air. Musculoskeletal: No acute musculoskeletal process. No destructive bone finding. IMPRESSION: 1. Enlarging prevascular and high RIGHT paratracheal lymph nodes, concerning for metastatic disease. 2. Stable left lower lobe pulmonary nodules. 3. No evidence of metastatic disease in the abdomen or pelvis. 4. Emphysema and aortic atherosclerosis. Aortic Atherosclerosis (ICD10-I70.0) and Emphysema (ICD10-J43.9). Electronically Signed   By: Zetta Bills M.D.   On: 12/15/2019 17:32   CT Abdomen Pelvis W Contrast  Result Date: 12/15/2019 CLINICAL DATA:  Small cell lung cancer with liver metastasis status post radiation. EXAM: CT CHEST, ABDOMEN, AND PELVIS WITH CONTRAST TECHNIQUE:  Multidetector CT imaging of the chest, abdomen and pelvis was performed following the standard protocol during bolus administration of intravenous contrast. CONTRAST:  123mL OMNIPAQUE IOHEXOL 300 MG/ML  SOLN COMPARISON:  09/07/2019 FINDINGS: CT CHEST FINDINGS Cardiovascular: Calcified and noncalcified plaque in the thoracic aorta with similar appearance to the prior exam. No is sign of aneurysmal dilation of the thoracic aorta. Calcified coronary artery disease as before. Heart size stable without pericardial effusion. Central pulmonary vasculature is unremarkable on venous phase assessment. Mediastinum/Nodes: Enlarging prevascular lymph node (image 16, series 2) 1.6 cm, previously 0.8 cm High RIGHT paratracheal lymph node just below the thoracic inlet (image 7, series 2) 1 cm, previously 8-9 mm. Cystic area in the anterior mediastinum unchanged (image 9, series 2 approximately 1.1 cm. Small AP window lymph node just less than a cm is slightly more rounded than on previous imaging. No hilar adenopathy. No axillary adenopathy. RIGHT-sided Port-A-Cath terminates at the caval to atrial junction. No thoracic inlet adenopathy. Lungs/Pleura: Marked pulmonary emphysema. Scarring of medial aspect of the RIGHT middle lobe. Small nodules in the LEFT lower lobe (image 113, series 3) 8 mm nodule unchanged. Adjacent nodule on image 111 of series 3 is also stable at approximately 6 mm, difficult to separate from surrounding bronchovascular structures. No consolidation. No pleural effusion. Musculoskeletal: No chest wall mass. CT ABDOMEN PELVIS FINDINGS Hepatobiliary: Lobular hepatic contours. No focal,  suspicious hepatic lesion. Mild biliary ductal distension, intrahepatic biliary tree is stable. No pericholecystic stranding. Pancreas: Pancreas is normal without focal lesion, ductal dilation or inflammation. Spleen: Spleen normal size without focal lesion. Adrenals/Urinary Tract: Adrenal glands are normal. Low-density lesions in  the bilateral kidneys are unchanged cyst in the upper pole of the LEFT kidney and other small renal lesions also likely cysts. Stomach/Bowel: No acute gastrointestinal process. The appendix is normal. Diverticular disease of the sigmoid colon. Vascular/Lymphatic: Calcific atheromatous plaque and noncalcified plaque of the abdominal aorta is unchanged in there is no evidence of aneurysm. No adenopathy in the retroperitoneum or upper abdomen. No pelvic lymphadenopathy. Reproductive: Prostate unremarkable.  Urinary bladder decompressed. Other: No ascites or free air. Musculoskeletal: No acute musculoskeletal process. No destructive bone finding. IMPRESSION: 1. Enlarging prevascular and high RIGHT paratracheal lymph nodes, concerning for metastatic disease. 2. Stable left lower lobe pulmonary nodules. 3. No evidence of metastatic disease in the abdomen or pelvis. 4. Emphysema and aortic atherosclerosis. Aortic Atherosclerosis (ICD10-I70.0) and Emphysema (ICD10-J43.9). Electronically Signed   By: Zetta Bills M.D.   On: 12/15/2019 17:32       ASSESSMENT AND PLAN:  1. 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 Staging PET scan on 02/02/2018 demonstrated hypermetabolic right cervical, bilateral supraclavicular, mediastinal, right hilar, and right internal mammary lymphadenopathy with hypermetabolic right liver lesion. Baseline MRI brain on 02/11/2017 was negative for intracranial disease. He is S/P Cisplatin/Etoposide x 6 cycles (02/09/2017- 05/27/2017). Unfortunately, he had a quick treatment failure on PET scan on 08/13/2017. He was therefore started on second-line therapy with Topotecan beginning on 08/24/2017- 11/14/2019. Postive response to therapy noted on 02/2018 scans.  Progression of disease noted on 12/15/2019 CT scans leading to a change in therapy to Lurbinectidin beginning on 12/22/2019  Labs  today: WBC 5.1 with normal differential and ANC 2.9, hemoglobin 9.9 g/dL, platelet count 226,000.  Metabolic panel suggestive of mild dehydration and minimal hypocalcemia of 8.8, and magnesium and LDH within normal notes.  Lab satisfy treatment parameters to embark on cycle #2 of therapy.  Nursing, in accordance with chemotherapy administration protocol, will monitor for acute side effects/toxicities associated with chemotherapy administration today.  Dictations and imaging related to cancer care are reviewed in detail.  Return in 3 weeks for follow-up and ongoing treatment.  We will plan on performing restaging scans following cycle #3 of therapy.   2. Smoker Still smoking 1 ppd  3. Other iron deficiency anemia Stable iron studies 3 weeks ago.  4. Other emphysema ([place]) Persistent and stable.  5. Clubbing of fingers Noted on examination.   ORDERS PLACED FOR THIS ENCOUNTER: Orders Placed This Encounter  Procedures  . CBC with Differential  . Comprehensive metabolic panel  . Lactate dehydrogenase  . Magnesium    MEDICATIONS PRESCRIBED THIS ENCOUNTER: No orders of the defined types were placed in this encounter.   All questions were answered. The patient knows to call the clinic with any problems, questions or concerns. We can certainly see the patient much sooner if necessary.  Patient and plan discussed with Dr. Derek Jack and he is in agreement with the aforementioned.   This note is electronically signed by: Robynn Pane, PA-C 01/13/2020 2:34 PM

## 2020-01-13 NOTE — Patient Instructions (Signed)
[place] at [facility] Discharge Instructions  You were seen today by Kirby Crigler PA. He went over your recent lab results. He will see you back in 3 weeks for labs, treatment and follow up.   Thank you for choosing [place] at [facility] to provide your oncology and hematology care.  To afford each patient quality time with our provider, please arrive at least 15 minutes before your scheduled appointment time.   If you have a lab appointment with the [facility] please come in thru the  Main Entrance and check in at the main information desk  You need to re-schedule your appointment should you arrive 10 or more minutes late.  We strive to give you quality time with our providers, and arriving late affects you and other patients whose appointments are after yours.  Also, if you no show three or more times for appointments you may be dismissed from the clinic at the providers discretion.     Again, thank you for choosing [facility].  Our hope is that these requests will decrease the amount of time that you wait before being seen by our physicians.       _____________________________________________________________  Should you have questions after your visit to [facility], please contact our office at [phone] between the hours of 8:00 a.m. and 4:30 p.m.  Voicemails left after 4:00 p.m. will not be returned until the following business day.  For prescription refill requests, have your pharmacy contact our office and allow 72 hours.    Cancer Center Support Programs:   > Cancer Support Group  2nd Tuesday of the month 1pm-2pm, Journey Room

## 2020-01-13 NOTE — Progress Notes (Signed)
1113 Labs reviewed with and pt seen by TKefalas PA and pt approved for Zepzelca infusion today with 500 ml NS hydration added per PA                                                                                   Harvie Junior tolerated Zepzelca infusion well without complaints or incident. VSS upon discharge. Pt discharged self ambulatory in satisfactory condition

## 2020-01-13 NOTE — Patient Instructions (Signed)
[facility] Discharge Instructions for Patients Receiving Chemotherapy   Beginning January 23rd 2017 lab work for the [facility] will be done in the  Main lab at [facility] on 1st floor. If you have a lab appointment with the [facility] please come in thru the  Main Entrance and check in at the main information desk   Today you received the following chemotherapy agents Zepzelca. Follow-up as scheduled  To help prevent nausea and vomiting after your treatment, we encourage you to take your nausea medication   If you develop nausea and vomiting, or diarrhea that is not controlled by your medication, call the clinic.  The clinic phone number is [phone]. Office hours are Monday-Friday 8:30am-5:00pm.  BELOW ARE SYMPTOMS THAT SHOULD BE REPORTED IMMEDIATELY:  *FEVER GREATER THAN 101.0 F  *CHILLS WITH OR WITHOUT FEVER  NAUSEA AND VOMITING THAT IS NOT CONTROLLED WITH YOUR NAUSEA MEDICATION  *UNUSUAL SHORTNESS OF BREATH  *UNUSUAL BRUISING OR BLEEDING  TENDERNESS IN MOUTH AND THROAT WITH OR WITHOUT PRESENCE OF ULCERS  *URINARY PROBLEMS  *BOWEL PROBLEMS  UNUSUAL RASH Items with * indicate a potential emergency and should be followed up as soon as possible. If you have an emergency after office hours please contact your primary care physician or go to the nearest emergency department.  Please call the clinic during office hours if you have any questions or concerns.   You may also contact the Patient Navigator at [phone] should you have any questions or need assistance in obtaining follow up care.      Resources For Cancer Patients and their Caregivers ? American [employer] Society: Can assist with transportation, wigs, general needs, runs Look Good Feel Better.        8545467866 ? Cancer Care: Provides financial assistance, online support groups, medication/co-pay assistance.  1-800-813-HOPE 579 727 3635) ? [employer] Assists [place] cancer patients and their families through emotional , educational and financial support.  312-876-7378 ? [place] DSS Where to apply for food stamps, Medicaid and utility assistance. 401 254 6465 ? [employer]: Transportation to medical appointments. 669-144-5243 ? Social Security Administration: May apply for disability if have a Stage IV cancer. (276)672-6610 9710232264 ? [employer], Disability and Transit Services: Assists with nutrition, care and transit needs. 6237456077

## 2020-01-17 ENCOUNTER — Other Ambulatory Visit: Payer: Self-pay | Admitting: Family Medicine

## 2020-01-24 ENCOUNTER — Other Ambulatory Visit (HOSPITAL_COMMUNITY): Payer: Self-pay | Admitting: Hematology

## 2020-01-24 DIAGNOSIS — C3490 Malignant neoplasm of unspecified part of unspecified bronchus or lung: Secondary | ICD-10-CM

## 2020-02-03 ENCOUNTER — Other Ambulatory Visit: Payer: Self-pay | Admitting: Family Medicine

## 2020-02-03 DIAGNOSIS — F411 Generalized anxiety disorder: Secondary | ICD-10-CM

## 2020-02-03 DIAGNOSIS — F5101 Primary insomnia: Secondary | ICD-10-CM

## 2020-02-03 NOTE — Telephone Encounter (Signed)
Ok to refill??  Last office visit 12/28/2018.  Last refill 01/06/2020.

## 2020-02-06 ENCOUNTER — Inpatient Hospital Stay (HOSPITAL_COMMUNITY): Payer: Medicare HMO

## 2020-02-06 ENCOUNTER — Inpatient Hospital Stay (HOSPITAL_COMMUNITY): Payer: Medicare HMO | Admitting: Hematology

## 2020-02-06 ENCOUNTER — Other Ambulatory Visit: Payer: Self-pay

## 2020-02-06 VITALS — BP 135/62 | HR 58 | Temp 97.5°F | Resp 18

## 2020-02-06 VITALS — BP 124/64 | HR 61 | Temp 97.8°F | Resp 18 | Wt 147.5 lb

## 2020-02-06 DIAGNOSIS — C770 Secondary and unspecified malignant neoplasm of lymph nodes of head, face and neck: Secondary | ICD-10-CM | POA: Diagnosis not present

## 2020-02-06 DIAGNOSIS — C3490 Malignant neoplasm of unspecified part of unspecified bronchus or lung: Secondary | ICD-10-CM

## 2020-02-06 DIAGNOSIS — Z5111 Encounter for antineoplastic chemotherapy: Secondary | ICD-10-CM | POA: Diagnosis not present

## 2020-02-06 DIAGNOSIS — F17210 Nicotine dependence, cigarettes, uncomplicated: Secondary | ICD-10-CM | POA: Diagnosis not present

## 2020-02-06 DIAGNOSIS — C787 Secondary malignant neoplasm of liver and intrahepatic bile duct: Secondary | ICD-10-CM | POA: Diagnosis not present

## 2020-02-06 LAB — CBC WITH DIFFERENTIAL/PLATELET
Abs Immature Granulocytes: 0.04 10*3/uL (ref 0.00–0.07)
Basophils Absolute: 0 10*3/uL (ref 0.0–0.1)
Basophils Relative: 1 %
Eosinophils Absolute: 0.1 10*3/uL (ref 0.0–0.5)
Eosinophils Relative: 2 %
HCT: 32.9 % — ABNORMAL LOW (ref 39.0–52.0)
Hemoglobin: 10.5 g/dL — ABNORMAL LOW (ref 13.0–17.0)
Immature Granulocytes: 1 %
Lymphocytes Relative: 23 %
Lymphs Abs: 1.5 10*3/uL (ref 0.7–4.0)
MCH: 30.5 pg (ref 26.0–34.0)
MCHC: 31.9 g/dL (ref 30.0–36.0)
MCV: 95.6 fL (ref 80.0–100.0)
Monocytes Absolute: 0.9 10*3/uL (ref 0.1–1.0)
Monocytes Relative: 14 %
Neutro Abs: 3.7 10*3/uL (ref 1.7–7.7)
Neutrophils Relative %: 59 %
Platelets: 263 10*3/uL (ref 150–400)
RBC: 3.44 MIL/uL — ABNORMAL LOW (ref 4.22–5.81)
RDW: 20.8 % — ABNORMAL HIGH (ref 11.5–15.5)
WBC: 6.3 10*3/uL (ref 4.0–10.5)
nRBC: 0 % (ref 0.0–0.2)

## 2020-02-06 LAB — COMPREHENSIVE METABOLIC PANEL
ALT: 15 U/L (ref 0–44)
AST: 16 U/L (ref 15–41)
Albumin: 4.1 g/dL (ref 3.5–5.0)
Alkaline Phosphatase: 53 U/L (ref 38–126)
Anion gap: 8 (ref 5–15)
BUN: 16 mg/dL (ref 8–23)
CO2: 25 mmol/L (ref 22–32)
Calcium: 9 mg/dL (ref 8.9–10.3)
Chloride: 103 mmol/L (ref 98–111)
Creatinine, Ser: 0.97 mg/dL (ref 0.61–1.24)
GFR calc Af Amer: >60 mL/min (ref >60)
GFR calc non Af Amer: >60 mL/min (ref >60)
Glucose, Bld: 103 mg/dL — ABNORMAL HIGH (ref 70–99)
Potassium: 4.3 mmol/L (ref 3.5–5.1)
Sodium: 136 mmol/L (ref 135–145)
Total Bilirubin: 0.6 mg/dL (ref 0.3–1.2)
Total Protein: 6.9 g/dL (ref 6.5–8.1)

## 2020-02-06 LAB — MAGNESIUM: Magnesium: 2.1 mg/dL (ref 1.7–2.4)

## 2020-02-06 LAB — LACTATE DEHYDROGENASE: LDH: 134 U/L (ref 98–192)

## 2020-02-06 MED ORDER — PALONOSETRON HCL INJECTION 0.25 MG/5ML
0.2500 mg | Freq: Once | INTRAVENOUS | Status: AC
  Administered 2020-02-06: 0.25 mg via INTRAVENOUS
  Filled 2020-02-06: qty 5

## 2020-02-06 MED ORDER — SODIUM CHLORIDE 0.9% FLUSH
10.0000 mL | INTRAVENOUS | Status: DC | PRN
  Administered 2020-02-06: 10 mL

## 2020-02-06 MED ORDER — SODIUM CHLORIDE 0.9 % IV SOLN: Freq: Once | INTRAVENOUS | Status: AC

## 2020-02-06 MED ORDER — HEPARIN SOD (PORK) LOCK FLUSH 100 UNIT/ML IV SOLN
500.0000 [IU] | Freq: Once | INTRAVENOUS | Status: AC | PRN
  Administered 2020-02-06: 500 [IU]

## 2020-02-06 MED ORDER — SODIUM CHLORIDE 0.9 % IV SOLN
3.2000 mg/m2 | Freq: Once | INTRAVENOUS | Status: AC
  Administered 2020-02-06: 5.8 mg via INTRAVENOUS
  Filled 2020-02-06: qty 11.6

## 2020-02-06 MED ORDER — SODIUM CHLORIDE 0.9 % IV SOLN
10.0000 mg | Freq: Once | INTRAVENOUS | Status: AC
  Administered 2020-02-06: 10 mg via INTRAVENOUS
  Filled 2020-02-06: qty 10

## 2020-02-06 NOTE — Progress Notes (Signed)
Patient has been assessed, vital signs and labs have been reviewed by Dr. Katragadda. ANC, Creatinine, LFTs, and Platelets are within treatment parameters per Dr. Katragadda. The patient is good to proceed with treatment at this time.  

## 2020-02-06 NOTE — Patient Instructions (Signed)
[facility] Discharge Instructions for Patients Receiving Chemotherapy  Today you received the following chemotherapy agents   To help prevent nausea and vomiting after your treatment, we encourage you to take your nausea medication   If you develop nausea and vomiting that is not controlled by your nausea medication, call the clinic.   BELOW ARE SYMPTOMS THAT SHOULD BE REPORTED IMMEDIATELY:  *FEVER GREATER THAN 100.5 F  *CHILLS WITH OR WITHOUT FEVER  NAUSEA AND VOMITING THAT IS NOT CONTROLLED WITH YOUR NAUSEA MEDICATION  *UNUSUAL SHORTNESS OF BREATH  *UNUSUAL BRUISING OR BLEEDING  TENDERNESS IN MOUTH AND THROAT WITH OR WITHOUT PRESENCE OF ULCERS  *URINARY PROBLEMS  *BOWEL PROBLEMS  UNUSUAL RASH Items with * indicate a potential emergency and should be followed up as soon as possible.  Feel free to call the clinic should you have any questions or concerns. The clinic phone number is [phone].  Please show the CHEMO ALERT CARD at check-in to the Emergency Department and triage nurse.   

## 2020-02-06 NOTE — Progress Notes (Signed)
Patient presents today for treatment and follow up visit with Dr.Katragadda. Labs reviewed. Vital signs stable.   Message received from [facility] LPN/ Dr. Delton Coombes to proceed with treatment. Labs within parameters for treatment. Patient has no complaints of any significant changes since his last visit. MAR reviewed.   Treatment given today per MD orders. Tolerated infusion without adverse affects. Vital signs stable. No complaints at this time. Discharged from clinic ambulatory. F/U with [facility] as scheduled.

## 2020-02-06 NOTE — Progress Notes (Signed)
[name], [name] [id number]   CLINIC:  Medical Oncology/Hematology  PCP:  David Frizzle, MD [address] 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 Bulky 5.4 cm right supraclavicular region malignant lymph node conglomeration with extracapsular extension. 2. Surrounding smaller abnormal right level 3 and level 5 lymph nodes, and the lymphadenopathy continues into the superior mediastinum, see Chest CT findings reported separately. 3. No other metastatic disease identified in the neck.   01/16/2017 Imaging   CT chest: IMPRESSION: 1. Extensive lymphadenopathy in the thorax and lower right cervical region, as discussed above. Primary differential considerations include lymphoma/leukemia or small cell carcinoma of the lung. Further evaluation a PET-CT could be considered to assess for additional sites of disease below the diaphragm if clinically appropriate. Additionally, ultrasound-guided biopsy of supraclavicular lymphadenopathy could be considered to establish a tissue diagnosis. 2. Indeterminate lesion in the periphery of segment 8 of the liver measuring 2.7 x 1.7 cm. Attention at time of follow-up PET-CT is recommended. 3. Aortic atherosclerosis, in addition to left main and 3 vessel coronary artery disease. Please note that although the presence of coronary artery calcium documents the presence of coronary artery disease, the severity of this disease and any potential stenosis cannot be assessed on this non-gated CT examination. Assessment for potential risk factor modification, dietary therapy or pharmacologic therapy may be warranted,  if clinically indicated. 4. There are calcifications of the aortic valve. Echocardiographic correlation for evaluation of potential valvular dysfunction may be warranted if clinically indicated. 5. Diffuse bronchial wall thickening with moderate centrilobular and paraseptal emphysema; imaging findings suggestive of underlying COPD.   02/03/2017 Initial Biopsy   (R) neck lymph node biopsy: SMALL CELL CARCINOMA (most likely lung primary).    02/03/2017 Miscellaneous   Port-a-cath attempted by IR; unable to place d/t enlarged SVC.    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 Staging for metastatic disease  EXAM: MRI HEAD WITHOUT AND WITH CONTRAST  TECHNIQUE: Multiplanar, multiecho pulse sequences of the brain and surrounding structures were obtained without and with intravenous contrast.  CONTRAST:  46m MULTIHANCE GADOBENATE DIMEGLUMINE 529 MG/ML IV SOLN  COMPARISON:  None.  FINDINGS: Brain: Negative for hydrocephalus. Cerebral volume normal for age. Small nonenhancing white matter hyperintensities consistent with mild chronic microvascular ischemia. No acute infarct. Negative for hemorrhage or mass or edema  Normal enhancement postcontrast infusion. No enhancing mass lesion. Leptomeningeal enhancement is normal.  Vascular: Normal arterial flow voids.  Normal venous enhancement  Skull and upper cervical spine: Negative  Sinuses/Orbits: Negative  Other: None  IMPRESSION: Negative for metastatic disease.  No acute abnormality.  Mild chronic white matter changes.   04/07/2017 Imaging    PET:  1. Marked reduction in size and metabolic activity of bulky RIGHT supraclavicular adenopathy mediastinal lymphadenopathy. 2. Residual moderate activity  remains within small RIGHT supraclavicular lymph node, RIGHT lower paratracheal lymph node and RIGHT hilar lymph node. 3. Resolution of prevascular and internal mammary mediastinal  metastatic hypermetabolic activity. 4. Resolution of metabolic activity associated with solitary RIGHT hepatic lobe liver metastasis. 5. No evidence of disease progression. 6. 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   05/27/2017 Imaging   MRI brain w/ and w/o contrast IMPRESSION: 1. No metastatic disease identified. 2. Increased nonspecific cerebral white matter signal changes since August. These are most commonly small vessel disease related. 3. New right maxillary sinusitis. Benign appearing retention cysts in the nasopharynx with trace mastoid effusions.   06/12/2017 Imaging   PET-CT IMPRESSION: 1. There are two new hypermetabolic nodules identified within both lower lobes measuring up to 3.1 cm. The appearance is nonspecific and may be inflammatory/infectious in etiology. Pulmonary metastatic disease cannot be excluded and short-term follow-up imaging in 3 months is advised to reassess these nodules. 2. Stable appearance of mild hypermetabolic activity associated with right paratracheal and right hilar lymph nodes. 3. Decrease in FDG uptake associated with index right supraclavicular lymph node. 4. 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 5. Aortic Atherosclerosis (ICD10-I70.0) and Emphysema (ICD10-J43.9).   08/24/2017 - 12/18/2019 Chemotherapy   The patient had pegfilgrastim (NEULASTA ONPRO [place]) injection 6 mg, 6 mg, Subcutaneous, Once, 30 of 31 cycles Administration: 6 mg (08/28/2017), 6 mg (09/18/2017), 6 mg (10/09/2017), 6 mg (11/02/2017), 6 mg (11/20/2017), 6 mg (12/18/2017), 6 mg (01/15/2018), 6 mg (02/12/2018),  6 mg (03/12/2018), 6 mg (04/09/2018), 6 mg (05/07/2018), 6 mg (06/04/2018), 6 mg (07/02/2018), 6 mg (07/30/2018), 6 mg (08/27/2018), 6 mg (09/24/2018), 6 mg (10/29/2018), 6 mg (11/26/2018), 6 mg (12/24/2018), 6 mg (01/21/2019), 6 mg (02/18/2019), 6 mg (03/18/2019), 6 mg (04/15/2019), 6 mg (05/13/2019), 6 mg (06/17/2019), 6 mg (07/22/2019), 6 mg (08/19/2019), 6 mg (09/16/2019), 6 mg (10/21/2019), 6 mg (11/18/2019) topotecan (HYCAMTIN) 2.9 mg in sodium chloride 0.9 % 100 mL chemo infusion, 1.5 mg/m2 = 2.9 mg, Intravenous,  Once, 30 of 31 cycles Dose modification: 1.2 mg/m2 (80 % of original dose 1.5 mg/m2, Cycle 4, Reason: Dose Not Tolerated) Administration: 2.9 mg (08/24/2017), 2.9 mg (08/25/2017), 2.9 mg (08/28/2017), 2.9 mg (08/26/2017), 2.9 mg (08/27/2017), 2.9 mg (09/14/2017), 2.9 mg (09/15/2017), 2.9 mg (09/16/2017), 2.9 mg (09/17/2017), 2.9 mg (09/18/2017), 2.9 mg (10/05/2017), 2.9 mg (10/06/2017), 2.9 mg (10/07/2017), 2.9 mg (10/08/2017), 2.9 mg (10/09/2017), 2.3 mg (10/26/2017), 2.3 mg (10/27/2017), 2.3 mg (10/28/2017), 2.3 mg (10/29/2017), 2.3 mg (11/02/2017), 2.3 mg (11/16/2017), 2.3 mg (11/17/2017), 2.3 mg (11/18/2017), 2.3 mg (11/19/2017), 2.3 mg (11/20/2017), 2.3 mg (12/14/2017), 2.3 mg (12/15/2017), 2.3 mg (12/16/2017), 2.3 mg (12/17/2017), 2.3 mg (12/18/2017), 2.3 mg (01/11/2018), 2.3 mg (01/12/2018), 2.3 mg (01/13/2018), 2.3 mg (01/15/2018), 2.3 mg (02/08/2018), 2.3 mg (02/09/2018), 2.3 mg (02/10/2018), 2.3 mg (02/11/2018), 2.3 mg (02/12/2018), 2.3 mg (03/08/2018), 2.3 mg (03/09/2018), 2.3 mg (03/10/2018), 2.3 mg (03/11/2018), 2.3 mg (03/12/2018), 2.2 mg (04/05/2018), 2.2 mg (04/06/2018), 2.2 mg (04/07/2018), 2.2 mg (04/08/2018), 2.2 mg (04/09/2018), 2.2 mg (05/03/2018), 2.2 mg (05/04/2018), 2.2 mg (05/05/2018), 2.2 mg (05/06/2018), 2.2 mg (05/07/2018), 2.2 mg (05/31/2018), 2.2 mg (06/01/2018), 2.2 mg (06/02/2018), 2.2 mg (06/03/2018), 2.2 mg (06/04/2018), 2.2 mg (06/28/2018), 2.2 mg (06/29/2018), 2.2 mg (06/30/2018), 2.2 mg (07/01/2018), 2.2 mg (07/02/2018), 2.2 mg (07/26/2018), 2.2 mg  (07/27/2018), 2.2 mg (07/28/2018), 2.2 mg (07/29/2018), 2.2 mg (07/30/2018), 2.2 mg (08/23/2018), 2.2 mg (08/24/2018), 2.2 mg (08/25/2018), 2.2 mg (08/26/2018), 2.2 mg (08/27/2018), 2.2 mg (09/20/2018), 2.2 mg (09/21/2018), 2.2 mg (09/22/2018), 2.2 mg (09/23/2018),  2.2 mg (09/24/2018), 2.2 mg (10/25/2018), 2.2 mg (10/26/2018), 2.2 mg (10/27/2018), 2.2 mg (10/28/2018), 2.2 mg (10/29/2018), 2.2 mg (11/22/2018), 2.2 mg (11/23/2018), 2.2 mg (11/24/2018), 2.2 mg (11/25/2018), 2.2 mg (11/26/2018), 2.2 mg (12/20/2018), 2.2 mg (12/21/2018), 2.2 mg (12/22/2018), 2.2 mg (12/23/2018), 2.2 mg (12/24/2018), 2.2 mg (01/17/2019), 2.2 mg (01/18/2019), 2.2 mg (01/19/2019), 2.2 mg (01/20/2019), 2.2 mg (01/21/2019), 2.2 mg (02/14/2019), 2.2 mg (02/15/2019), 2.2 mg (02/16/2019), 2.2 mg (02/17/2019), 2.2 mg (02/18/2019), 2.2 mg (03/14/2019), 2.2 mg (03/15/2019), 2.2 mg (03/16/2019), 2.2 mg (03/17/2019), 2.2 mg (03/18/2019), 2.2 mg (04/11/2019), 2.2 mg (04/12/2019), 2.2 mg (04/13/2019), 2.2 mg (04/14/2019), 2.2 mg (04/15/2019), 2.2 mg (05/09/2019), 2.2 mg (05/10/2019), 2.2 mg (05/11/2019), 2.2 mg (05/12/2019), 2.2 mg (05/13/2019), 2.2 mg (06/13/2019), 2.2 mg (06/14/2019), 2.2 mg (06/15/2019), 2.2 mg (06/16/2019), 2.2 mg (06/17/2019), 2.2 mg (07/18/2019), 2.2 mg (07/19/2019), 2.2 mg (07/20/2019), 2.2 mg (07/21/2019), 2.2 mg (07/22/2019), 2.2 mg (08/15/2019), 2.2 mg (08/16/2019), 2.2 mg (08/17/2019), 2.2 mg (08/18/2019), 2.2 mg (08/19/2019), 2.2 mg (09/12/2019), 2.2 mg (09/13/2019), 2.2 mg (09/14/2019), 2.2 mg (09/15/2019), 2.2 mg (09/16/2019), 2.2 mg (10/17/2019), 2.2 mg (10/18/2019), 2.2 mg (10/19/2019), 2.2 mg (10/20/2019), 2.2 mg (10/21/2019), 2.2 mg (11/14/2019), 2.2 mg (11/15/2019), 2.2 mg (11/16/2019), 2.2 mg (11/17/2019), 2.2 mg (11/18/2019) ondansetron (ZOFRAN) 4 mg in sodium chloride 0.9 % 50 mL IVPB, , Intravenous,  Once, 25 of 26 cycles Administration:  (02/08/2018),  (02/09/2018),  (02/10/2018),  (02/11/2018),  (02/12/2018),  (03/08/2018),  (03/09/2018),  (03/10/2018),  (03/11/2018),  (03/12/2018),  (04/05/2018),  (04/06/2018),  (04/07/2018),  (04/08/2018),   (04/09/2018),  (05/03/2018),  (05/04/2018),  (05/05/2018),  (05/06/2018),  (05/07/2018),  (05/31/2018),  (06/01/2018),  (06/02/2018),  (06/03/2018),  (06/04/2018),  (06/28/2018),  (06/29/2018),  (06/30/2018),  (07/01/2018),  (07/02/2018),  (07/26/2018),  (07/27/2018),  (07/28/2018),  (07/29/2018),  (07/30/2018),  (08/23/2018),  (08/24/2018),  (08/25/2018),  (08/26/2018),  (08/27/2018),  (09/20/2018),  (09/21/2018),  (09/22/2018),  (09/23/2018),  (09/24/2018),  (10/25/2018),  (10/26/2018),  (10/27/2018),  (10/28/2018),  (10/29/2018),  (11/22/2018),  (11/23/2018),  (11/24/2018),  (11/25/2018),  (11/26/2018),  (12/20/2018),  (12/21/2018),  (12/22/2018),  (12/23/2018),  (12/24/2018),  (01/17/2019),  (01/18/2019),  (01/19/2019),  (01/20/2019),  (01/21/2019),  (02/14/2019),  (02/15/2019),  (02/16/2019),  (02/17/2019),  (02/18/2019),  (03/14/2019),  (03/15/2019),  (03/16/2019),  (03/17/2019),  (03/18/2019),  (04/11/2019),  (04/12/2019),  (04/13/2019),  (04/14/2019),  (04/15/2019),  (05/09/2019),  (05/10/2019),  (05/11/2019),  (05/12/2019),  (05/13/2019),  (06/13/2019),  (06/14/2019),  (06/15/2019),  (06/16/2019),  (06/17/2019),  (07/18/2019),  (07/19/2019),  (07/20/2019),  (07/21/2019),  (07/22/2019),  (08/15/2019),  (08/16/2019),  (08/17/2019),  (08/18/2019),  (08/19/2019),  (09/12/2019),  (09/13/2019),  (09/14/2019),  (09/15/2019),  (09/16/2019),  (10/17/2019),  (10/18/2019),  (10/19/2019),  (10/20/2019),  (10/21/2019),  (11/14/2019),  (11/15/2019),  (11/16/2019),  (11/17/2019),  (11/18/2019)  for chemotherapy treatment.    12/22/2019 -  Chemotherapy   The patient had palonosetron (ALOXI) injection 0.25 mg, 0.25 mg, Intravenous,  Once, 2 of 4 cycles Administration: 0.25 mg (12/22/2019), 0.25 mg (01/13/2020) lurbinectedin (ZEPZELCA) 5.8 mg in sodium chloride 0.9 % 250 mL chemo infusion, 3.2 mg/m2 = 5.8 mg, Intravenous,  Once, 2 of 4 cycles Administration: 5.8 mg (12/22/2019), 5.8 mg (01/13/2020)  for chemotherapy treatment.      CANCER STAGING: Cancer Staging No matching staging information was found for the patient.  INTERVAL  HISTORY:  [name], a 69 y.o. male, returns for routine follow-up and consideration for next cycle of chemotherapy. [name] was last seen on 01/13/2020.  Due for cycle #3 of Lurbinectidin today.   Overall, he tells me  he has been feeling pretty well. He continues to have urinary problems. He also continues to cough.   Overall, he feels ready for next cycle of chemo today.    REVIEW OF SYSTEMS:  Review of Systems  Constitutional: Positive for appetite change (severely decreased) and fatigue (moderate).  HENT:  Negative.   Eyes: Negative.   Respiratory: Positive for shortness of breath (on exertion).   Cardiovascular: Negative.   Gastrointestinal: Positive for diarrhea.  Endocrine: Negative.   Genitourinary: Negative.    Musculoskeletal: Negative.   Skin: Negative.   Neurological: Positive for dizziness and numbness (feet).  Hematological: Negative.   Psychiatric/Behavioral: Positive for sleep disturbance.  All other systems reviewed and are negative.   PAST MEDICAL/SURGICAL HISTORY:  Past Medical History:  Diagnosis Date  . Anxiety   . CAD (coronary artery disease)   . Cancer ([place])    stage 4 small cell lung cancer   . COPD (chronic obstructive pulmonary disease) ([place])   . Depression   . Dyspnea    increased exertion  . Feeling of chest tightness   . Heart palpitations   . History of chemotherapy   . Myocardial infarction ([place])   . Osteopenia   . Panic attacks   . Smoker    Past Surgical History:  Procedure Laterality Date  . BACK SURGERY  12/24/2000   L5,S1  . CORONARY STENT PLACEMENT  2005   RCA & CX  . HERNIA REPAIR Right 1980's  . INGUINAL HERNIA REPAIR  12/1978   right side  . IR FLUORO GUIDE PORT INSERTION RIGHT  04/02/2017  . IR US GUIDE BX ASP/DRAIN  02/03/2017  . IR US GUIDE VASC ACCESS RIGHT  04/02/2017  . NM MYOCAR PERF WALL MOTION  09/07/2009   No ischemia; EF 51%  . SHOULDER SURGERY Left 08/2010  . SPINE SURGERY  2002   L5-S1    SOCIAL  HISTORY:  Social History   Socioeconomic History  . Marital status: Single    Spouse name: Not on file  . Number of children: Not on file  . Years of education: Not on file  . Highest education level: Not on file  Occupational History  . Not on file  Tobacco Use  . Smoking status: Current Every Day Smoker    Packs/day: 1.00    Types: Cigarettes  . Smokeless tobacco: Never Used  Vaping Use  . Vaping Use: Never used  Substance and Sexual Activity  . Alcohol use: Yes    Comment: occas  . Drug use: No  . Sexual activity: Not on file  Other Topics Concern  . Not on file  Social History Narrative  . Not on file   Social Determinants of Health   Financial Resource Strain:   . Difficulty of Paying Living Expenses:   Food Insecurity:   . Worried About Charity fundraiser in the Last Year:   . Arboriculturist in the Last Year:   Transportation Needs:   . Film/video editor (Medical):   Marland Kitchen Lack of Transportation (Non-Medical):   Physical Activity:   . Days of Exercise per Week:   . Minutes of Exercise per Session:   Stress:   . Feeling of Stress :   Social Connections:   . Frequency of Communication with Friends and Family:   . Frequency of Social Gatherings with Friends and Family:   . Attends Religious Services:   . Active Member of Clubs or Organizations:   . Attends  Club or Organization Meetings:   Marland Kitchen Marital Status:   Intimate Partner Violence:   . Fear of Current or Ex-Partner:   . Emotionally Abused:   Marland Kitchen Physically Abused:   . Sexually Abused:     FAMILY HISTORY:  Family History  Problem Relation Age of Onset  . Heart attack Father   . Kidney disease Father        renal failure  . Heart failure Mother   . Heart attack Mother   . Cancer Brother   . Diabetes Brother   . Alcohol abuse Brother   . Diabetes Sister     CURRENT MEDICATIONS:  Current Outpatient Medications  Medication Sig Dispense Refill  . ALPRAZolam (XANAX) 1 MG tablet TAKE (1) TABLET  BY MOUTH (4) TIMES DAILY. 120 tablet 0  . Ascorbic Acid (VITAMIN C) 1000 MG tablet Take 1,000 mg by mouth daily.    Marland Kitchen aspirin EC 81 MG tablet Take 81 mg by mouth daily.    Marland Kitchen atorvastatin (LIPITOR) 40 MG tablet TAKE (1) TABLET BY MOUTH ONCE DAILY. 90 tablet 1  . Bioflavonoid Products (ESTER C PO) Take 1 tablet by mouth daily.    . calcium-vitamin D (OSCAL WITH D) 250-125 MG-UNIT tablet Take 1 tablet by mouth daily.    . cholecalciferol (VITAMIN D3) 25 MCG (1000 UT) tablet Take 1,000 Units by mouth daily.    . clotrimazole-betamethasone (LOTRISONE) cream Apply 1 application topically 2 (two) times daily. 30 g 0  . docusate sodium (COLACE) 100 MG capsule Take two capsules at bedtime starting the day you receive chemotherapy and then for four days after 30 capsule 3  . gabapentin (NEURONTIN) 300 MG capsule Take 2 capsules (600 mg total) by mouth 3 (three) times daily. 180 capsule 3  . hydrOXYzine (ATARAX/VISTARIL) 50 MG tablet TAKE ONE TABLET BY MOUTH AT BEDTIME AS NEEDED FOR SLEEP. 30 tablet 0  . Multiple Vitamins-Minerals (CENTRUM SILVER 50+MEN) TABS Take 1 tablet by mouth daily.    Marland Kitchen oxyCODONE-acetaminophen (PERCOCET/ROXICET) 5-325 MG tablet TAKE 1 TABLET EVERY 8 HOURS AS NEEDED FOR SEVERE PAIN. 90 tablet 0  . Pegfilgrastim (NEULASTA ONPRO [place]) Inject into the skin. Every 21 days    . senna (SENOKOT) 8.6 MG tablet Take 3 tablets by mouth daily.     . topotecan in sodium chloride 0.9 % 100 mL Inject into the vein once. Days 1-5 every 21 days    . vitamin B-12 (CYANOCOBALAMIN) 100 MCG tablet Take 100 mcg by mouth daily.    Marland Kitchen albuterol (VENTOLIN HFA) 108 (90 Base) MCG/ACT inhaler Inhale 2 puffs into the lungs every 6 (six) hours as needed for wheezing or shortness of breath.  (Patient not taking: Reported on 02/06/2020)    . ondansetron (ZOFRAN) 8 MG tablet Take 1 tablet (8 mg total) by mouth 2 (two) times daily as needed. Start on the third day after cisplatin chemotherapy. (Patient not taking: Reported  on 02/06/2020) 30 tablet 1  . prochlorperazine (COMPAZINE) 10 MG tablet Take 1 tablet (10 mg total) by mouth every 6 (six) hours as needed (Nausea or vomiting). (Patient not taking: Reported on 02/06/2020) 60 tablet 1   No current facility-administered medications for this visit.    ALLERGIES:  Allergies  Allergen Reactions  . Codeine Nausea Only  . Niaspan [Niacin Er]     PHYSICAL EXAM:  Performance status (ECOG): 1 - Symptomatic but completely ambulatory  Vitals:   02/06/20 1019  BP: (!) 124/64  Pulse: 61  Resp: 18  Temp: 97.8 F (36.6 C)  SpO2: 92%   Wt Readings from Last 3 Encounters:  02/06/20 147 lb 8 oz (66.9 kg)  01/13/20 147 lb 4.8 oz (66.8 kg)  12/29/19 151 lb 12.8 oz (68.9 kg)   Physical Exam Vitals reviewed.  Constitutional:      Appearance: Normal appearance.  HENT:     Mouth/Throat:     Mouth: Mucous membranes are moist.  Eyes:     Pupils: Pupils are equal, round, and reactive to light.  Cardiovascular:     Rate and Rhythm: Normal rate and regular rhythm.     Pulses: Normal pulses.     Heart sounds: Normal heart sounds.  Pulmonary:     Effort: Pulmonary effort is normal.     Breath sounds: Normal breath sounds.  Abdominal:     Palpations: Abdomen is soft.  Lymphadenopathy:     Upper Body:     Right upper body: Supraclavicular adenopathy (Improved in size significantly) present. No axillary, pectoral or epitrochlear adenopathy.     Left upper body: No axillary, pectoral or epitrochlear adenopathy.  Neurological:     General: No focal deficit present.     Mental Status: He is alert and oriented to person, place, and time.  Psychiatric:        Mood and Affect: Mood normal.        Behavior: Behavior normal.     LABORATORY DATA:  I have reviewed the labs as listed.  CBC Latest Ref Rng & Units 02/06/2020 01/13/2020 12/29/2019  WBC 4.0 - 10.5 K/uL 6.3 5.1 6.8  Hemoglobin 13.0 - 17.0 g/dL 10.5(L) 9.9(L) 9.3(L)  Hematocrit 39 - 52 % 32.9(L) 31.6(L)  29.8(L)  Platelets 150 - 400 K/uL 263 226 98(L)   CMP Latest Ref Rng & Units 02/06/2020 01/13/2020 12/29/2019  Glucose 70 - 99 mg/dL 103(H) 107(H) 124(H)  BUN 8 - 23 mg/dL 16 27(H) 23  Creatinine 0.61 - 1.24 mg/dL 0.97 0.96 0.88  Sodium 135 - 145 mmol/L 136 138 137  Potassium 3.5 - 5.1 mmol/L 4.3 4.0 3.9  Chloride 98 - 111 mmol/L 103 104 103  CO2 22 - 32 mmol/L 25 26 26   Calcium 8.9 - 10.3 mg/dL 9.0 8.8(L) 8.5(L)  Total Protein 6.5 - 8.1 g/dL 6.9 6.5 6.5  Total Bilirubin 0.3 - 1.2 mg/dL 0.6 0.5 0.4  Alkaline Phos 38 - 126 U/L 53 53 52  AST 15 - 41 U/L 16 17 20   ALT 0 - 44 U/L 15 16 23     DIAGNOSTIC IMAGING:  I have independently reviewed the scans and discussed with the patient. No results found.   ASSESSMENT:  1. 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 -CT CAP on 12/15/2019 showed enlarging prevascular and right paratracheal lymph nodes. Stable left lower lobe lung nodules. -CT neck on 12/15/2019 showed malignant lymphadenopathy in the lower right posterior triangle and jugular chain. - Lurbinectidin 3.67m/kg started on 12/22/2019.   PLAN:  1. 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 -His right-sided supraclavicular lymphadenopathy has improved. -I have reviewed his labs today.  White count and platelet count is adequate to proceed with his next cycle.  LFTs are normal. -He will proceed with cycle 3 today.  I plan to see him back in 3 weeks for follow-up.  I plan to repeat CT neck, chest, abdomen and pelvis prior to next visit to evaluate response.  2.  Normocytic anemia: -His hemoglobin improved to 10.5 today.  I plan to check his ferritin and iron panel at next visit.  3. Chronic pain: -Continue Percocet 1 tablet daily as needed.  If he takes more, he will get constipated.  4. Peripheral neuropathy: -Continue gabapentin 600 mg 3 times daily.   Orders  placed this encounter:  No orders of the defined types were placed in this encounter.    Derek Jack, MD [facility] [phone]   I, Jacqualyn Posey, am acting as a scribe for Dr. Sanda Linger.  I, Derek Jack MD, have reviewed the above documentation for accuracy and completeness, and I agree with the above.

## 2020-02-06 NOTE — Patient Instructions (Signed)
[place] at [facility] Discharge Instructions  You were seen today by Dr. Delton Coombes. He went over your recent results. We will be scheduling you for several imaging studies prior to your next visit. Dr. Delton Coombes will see you back in 3 weeks for labs and follow up.   Thank you for choosing [place] at [facility] to provide your oncology and hematology care.  To afford each patient quality time with our provider, please arrive at least 15 minutes before your scheduled appointment time.   If you have a lab appointment with the [facility] please come in thru the Main Entrance and check in at the main information desk  You need to re-schedule your appointment should you arrive 10 or more minutes late.  We strive to give you quality time with our providers, and arriving late affects you and other patients whose appointments are after yours.  Also, if you no show three or more times for appointments you may be dismissed from the clinic at the providers discretion.     Again, thank you for choosing [facility].  Our hope is that these requests will decrease the amount of time that you wait before being seen by our physicians.       _____________________________________________________________  Should you have questions after your visit to [facility], please contact our office at [phone] between the hours of 8:00 a.m. and 4:30 p.m.  Voicemails left after 4:00 p.m. will not be returned until the following business day.  For prescription refill requests, have your pharmacy contact our office and allow 72 hours.    Cancer Center Support Programs:   > Cancer Support Group  2nd Tuesday of the month 1pm-2pm, Journey Room

## 2020-02-23 ENCOUNTER — Ambulatory Visit (HOSPITAL_COMMUNITY)
Admission: RE | Admit: 2020-02-23 | Discharge: 2020-02-23 | Disposition: A | Discharge status: Home / Self Care | Payer: Medicare HMO | Source: Ambulatory Visit | Attending: Hematology | Admitting: Hematology

## 2020-02-23 ENCOUNTER — Other Ambulatory Visit: Payer: Self-pay

## 2020-02-23 ENCOUNTER — Other Ambulatory Visit (HOSPITAL_COMMUNITY): Payer: Medicare HMO

## 2020-02-23 DIAGNOSIS — C3490 Malignant neoplasm of unspecified part of unspecified bronchus or lung: Secondary | ICD-10-CM | POA: Insufficient documentation

## 2020-02-23 MED ORDER — IOHEXOL 300 MG/ML  SOLN
150.0000 mL | Freq: Once | INTRAMUSCULAR | Status: AC | PRN
  Administered 2020-02-23: 150 mL via INTRAVENOUS

## 2020-02-27 ENCOUNTER — Encounter (HOSPITAL_COMMUNITY): Payer: Self-pay | Admitting: Hematology

## 2020-02-27 ENCOUNTER — Inpatient Hospital Stay (HOSPITAL_COMMUNITY): Payer: Medicare HMO | Admitting: Hematology

## 2020-02-27 ENCOUNTER — Inpatient Hospital Stay (HOSPITAL_COMMUNITY): Payer: Medicare HMO

## 2020-02-27 ENCOUNTER — Inpatient Hospital Stay (HOSPITAL_COMMUNITY): Payer: Medicare HMO | Attending: Hematology

## 2020-02-27 ENCOUNTER — Other Ambulatory Visit: Payer: Self-pay

## 2020-02-27 VITALS — BP 133/47 | HR 52 | Resp 18

## 2020-02-27 VITALS — BP 137/55 | HR 57 | Temp 97.1°F | Resp 17 | Wt 146.4 lb

## 2020-02-27 DIAGNOSIS — C3490 Malignant neoplasm of unspecified part of unspecified bronchus or lung: Secondary | ICD-10-CM

## 2020-02-27 DIAGNOSIS — C787 Secondary malignant neoplasm of liver and intrahepatic bile duct: Secondary | ICD-10-CM | POA: Insufficient documentation

## 2020-02-27 DIAGNOSIS — Z5111 Encounter for antineoplastic chemotherapy: Secondary | ICD-10-CM | POA: Diagnosis not present

## 2020-02-27 DIAGNOSIS — C770 Secondary and unspecified malignant neoplasm of lymph nodes of head, face and neck: Secondary | ICD-10-CM | POA: Insufficient documentation

## 2020-02-27 LAB — CBC WITH DIFFERENTIAL/PLATELET
Abs Immature Granulocytes: 0.02 10*3/uL (ref 0.00–0.07)
Basophils Absolute: 0 10*3/uL (ref 0.0–0.1)
Basophils Relative: 1 %
Eosinophils Absolute: 0.1 10*3/uL (ref 0.0–0.5)
Eosinophils Relative: 2 %
HCT: 30.8 % — ABNORMAL LOW (ref 39.0–52.0)
Hemoglobin: 9.9 g/dL — ABNORMAL LOW (ref 13.0–17.0)
Immature Granulocytes: 1 %
Lymphocytes Relative: 31 %
Lymphs Abs: 1.1 10*3/uL (ref 0.7–4.0)
MCH: 30.5 pg (ref 26.0–34.0)
MCHC: 32.1 g/dL (ref 30.0–36.0)
MCV: 94.8 fL (ref 80.0–100.0)
Monocytes Absolute: 0.7 10*3/uL (ref 0.1–1.0)
Monocytes Relative: 19 %
Neutro Abs: 1.7 10*3/uL (ref 1.7–7.7)
Neutrophils Relative %: 46 %
Platelets: 268 10*3/uL (ref 150–400)
RBC: 3.25 MIL/uL — ABNORMAL LOW (ref 4.22–5.81)
RDW: 20.8 % — ABNORMAL HIGH (ref 11.5–15.5)
WBC: 3.6 10*3/uL — ABNORMAL LOW (ref 4.0–10.5)
nRBC: 0 % (ref 0.0–0.2)

## 2020-02-27 LAB — COMPREHENSIVE METABOLIC PANEL
ALT: 15 U/L (ref 0–44)
AST: 17 U/L (ref 15–41)
Albumin: 3.9 g/dL (ref 3.5–5.0)
Alkaline Phosphatase: 48 U/L (ref 38–126)
Anion gap: 7 (ref 5–15)
BUN: 18 mg/dL (ref 8–23)
CO2: 23 mmol/L (ref 22–32)
Calcium: 8.7 mg/dL — ABNORMAL LOW (ref 8.9–10.3)
Chloride: 106 mmol/L (ref 98–111)
Creatinine, Ser: 0.98 mg/dL (ref 0.61–1.24)
GFR calc Af Amer: >60 mL/min (ref >60)
GFR calc non Af Amer: >60 mL/min (ref >60)
Glucose, Bld: 101 mg/dL — ABNORMAL HIGH (ref 70–99)
Potassium: 4.1 mmol/L (ref 3.5–5.1)
Sodium: 136 mmol/L (ref 135–145)
Total Bilirubin: 0.5 mg/dL (ref 0.3–1.2)
Total Protein: 6.6 g/dL (ref 6.5–8.1)

## 2020-02-27 LAB — IRON AND TIBC
Iron: 51 ug/dL (ref 45–182)
Saturation Ratios: 19 % (ref 17.9–39.5)
TIBC: 274 ug/dL (ref 250–450)
UIBC: 223 ug/dL

## 2020-02-27 LAB — FERRITIN: Ferritin: 235 ng/mL (ref 24–336)

## 2020-02-27 LAB — MAGNESIUM: Magnesium: 2.2 mg/dL (ref 1.7–2.4)

## 2020-02-27 MED ORDER — SODIUM CHLORIDE 0.9 % IV SOLN
3.2000 mg/m2 | Freq: Once | INTRAVENOUS | Status: AC
  Administered 2020-02-27: 5.8 mg via INTRAVENOUS
  Filled 2020-02-27: qty 11.6

## 2020-02-27 MED ORDER — SODIUM CHLORIDE 0.9 % IV SOLN
10.0000 mg | Freq: Once | INTRAVENOUS | Status: AC
  Administered 2020-02-27: 10 mg via INTRAVENOUS
  Filled 2020-02-27: qty 10

## 2020-02-27 MED ORDER — PALONOSETRON HCL INJECTION 0.25 MG/5ML
0.2500 mg | Freq: Once | INTRAVENOUS | Status: AC
  Administered 2020-02-27: 0.25 mg via INTRAVENOUS
  Filled 2020-02-27: qty 5

## 2020-02-27 MED ORDER — HEPARIN SOD (PORK) LOCK FLUSH 100 UNIT/ML IV SOLN
500.0000 [IU] | Freq: Once | INTRAVENOUS | Status: AC | PRN
  Administered 2020-02-27: 500 [IU]

## 2020-02-27 MED ORDER — SODIUM CHLORIDE 0.9% FLUSH
10.0000 mL | INTRAVENOUS | Status: DC | PRN
  Administered 2020-02-27: 10 mL

## 2020-02-27 MED ORDER — SODIUM CHLORIDE 0.9 % IV SOLN: Freq: Once | INTRAVENOUS | Status: AC

## 2020-02-27 NOTE — Progress Notes (Signed)
[name], [place] [address]   CLINIC:  Medical Oncology/Hematology  PCP:  [name], MD [address] [place] [address] [phone]   REASON FOR VISIT:  Follow-up for SCLC with liver metastasis  PRIOR THERAPY: Topotecan x 30 cycles from 08/24/2017 to 11/18/2019.  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 Bulky 5.4 cm right supraclavicular region malignant lymph node conglomeration with extracapsular extension. 2. Surrounding smaller abnormal right level 3 and level 5 lymph nodes, and the lymphadenopathy continues into the superior mediastinum, see Chest CT findings reported separately. 3. No other metastatic disease identified in the neck.   01/16/2017 Imaging   CT chest: IMPRESSION: 1. Extensive lymphadenopathy in the thorax and lower right cervical region, as discussed above. Primary differential considerations include lymphoma/leukemia or small cell carcinoma of the lung. Further evaluation a PET-CT could be considered to assess for additional sites of disease below the diaphragm if clinically appropriate. Additionally, ultrasound-guided biopsy of supraclavicular lymphadenopathy could be considered to establish a tissue diagnosis. 2. Indeterminate lesion in the periphery of segment 8 of the liver measuring 2.7 x 1.7 cm. Attention at time of follow-up PET-CT is recommended. 3. Aortic atherosclerosis, in addition to left main and 3 vessel coronary artery disease. Please note that although the presence of coronary artery calcium documents the presence of coronary artery disease, the severity of this disease and any potential stenosis cannot be assessed on this non-gated CT examination. Assessment for potential risk factor modification, dietary  therapy or pharmacologic therapy may be warranted, if clinically indicated. 4. There are calcifications of the aortic valve. Echocardiographic correlation for evaluation of potential valvular dysfunction may be warranted if clinically indicated. 5. Diffuse bronchial wall thickening with moderate centrilobular and paraseptal emphysema; imaging findings suggestive of underlying COPD.   02/03/2017 Initial Biopsy   (R) neck lymph node biopsy: SMALL CELL CARCINOMA (most likely lung primary).    02/03/2017 Miscellaneous   Port-a-cath attempted by IR; unable to place d/t enlarged SVC.    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 Staging for metastatic disease  EXAM: MRI HEAD WITHOUT AND WITH CONTRAST  TECHNIQUE: Multiplanar, multiecho pulse sequences of the brain and surrounding structures were obtained without and with intravenous contrast.  CONTRAST:  82m MULTIHANCE GADOBENATE DIMEGLUMINE 529 MG/ML IV SOLN  COMPARISON:  None.  FINDINGS: Brain: Negative for hydrocephalus. Cerebral volume normal for age. Small nonenhancing white matter hyperintensities consistent with mild chronic microvascular ischemia. No acute infarct. Negative for hemorrhage or mass or edema  Normal enhancement postcontrast infusion. No enhancing mass lesion. Leptomeningeal enhancement is normal.  Vascular: Normal arterial flow voids.  Normal venous enhancement  Skull and upper cervical spine: Negative  Sinuses/Orbits: Negative  Other: None  IMPRESSION: Negative for metastatic disease.  No acute abnormality.  Mild chronic white matter changes.   04/07/2017 Imaging    PET:  1. Marked reduction in size and metabolic activity of bulky RIGHT supraclavicular adenopathy mediastinal  lymphadenopathy. 2. Residual moderate activity remains within small RIGHT supraclavicular lymph node, RIGHT lower paratracheal lymph node and RIGHT hilar lymph node.  3. Resolution of prevascular and internal mammary mediastinal metastatic hypermetabolic activity. 4. Resolution of metabolic activity associated with solitary RIGHT hepatic lobe liver metastasis. 5. No evidence of disease progression. 6. 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   05/27/2017 Imaging   MRI brain w/ and w/o contrast IMPRESSION: 1. No metastatic disease identified. 2. Increased nonspecific cerebral white matter signal changes since August. These are most commonly small vessel disease related. 3. New right maxillary sinusitis. Benign appearing retention cysts in the nasopharynx with trace mastoid effusions.   06/12/2017 Imaging   PET-CT IMPRESSION: 1. There are two new hypermetabolic nodules identified within both lower lobes measuring up to 3.1 cm. The appearance is nonspecific and may be inflammatory/infectious in etiology. Pulmonary metastatic disease cannot be excluded and short-term follow-up imaging in 3 months is advised to reassess these nodules. 2. Stable appearance of mild hypermetabolic activity associated with right paratracheal and right hilar lymph nodes. 3. Decrease in FDG uptake associated with index right supraclavicular lymph node. 4. 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 5. Aortic Atherosclerosis (ICD10-I70.0) and Emphysema (ICD10-J43.9).   08/24/2017 - 12/18/2019 Chemotherapy   The patient had pegfilgrastim (NEULASTA ONPRO [place]) injection 6 mg, 6 mg, Subcutaneous, Once, 30 of 31 cycles Administration: 6 mg (08/28/2017), 6 mg (09/18/2017), 6 mg (10/09/2017), 6 mg (11/02/2017), 6 mg (11/20/2017), 6  mg (12/18/2017), 6 mg (01/15/2018), 6 mg (02/12/2018), 6 mg (03/12/2018), 6 mg (04/09/2018), 6 mg (05/07/2018), 6 mg (06/04/2018), 6 mg (07/02/2018), 6 mg (07/30/2018), 6 mg (08/27/2018), 6 mg (09/24/2018), 6 mg (10/29/2018), 6 mg (11/26/2018), 6 mg (12/24/2018), 6 mg (01/21/2019), 6 mg (02/18/2019), 6 mg (03/18/2019), 6 mg (04/15/2019), 6 mg (05/13/2019), 6 mg (06/17/2019), 6 mg (07/22/2019), 6 mg (08/19/2019), 6 mg (09/16/2019), 6 mg (10/21/2019), 6 mg (11/18/2019) topotecan (HYCAMTIN) 2.9 mg in sodium chloride 0.9 % 100 mL chemo infusion, 1.5 mg/m2 = 2.9 mg, Intravenous,  Once, 30 of 31 cycles Dose modification: 1.2 mg/m2 (80 % of original dose 1.5 mg/m2, Cycle 4, Reason: Dose Not Tolerated) Administration: 2.9 mg (08/24/2017), 2.9 mg (08/25/2017), 2.9 mg (08/28/2017), 2.9 mg (08/26/2017), 2.9 mg (08/27/2017), 2.9 mg (09/14/2017), 2.9 mg (09/15/2017), 2.9 mg (09/16/2017), 2.9 mg (09/17/2017), 2.9 mg (09/18/2017), 2.9 mg (10/05/2017), 2.9 mg (10/06/2017), 2.9 mg (10/07/2017), 2.9 mg (10/08/2017), 2.9 mg (10/09/2017), 2.3 mg (10/26/2017), 2.3 mg (10/27/2017), 2.3 mg (10/28/2017), 2.3 mg (10/29/2017), 2.3 mg (11/02/2017), 2.3 mg (11/16/2017), 2.3 mg (11/17/2017), 2.3 mg (11/18/2017), 2.3 mg (11/19/2017), 2.3 mg (11/20/2017), 2.3 mg (12/14/2017), 2.3 mg (12/15/2017), 2.3 mg (12/16/2017), 2.3 mg (12/17/2017), 2.3 mg (12/18/2017), 2.3 mg (01/11/2018), 2.3 mg (01/12/2018), 2.3 mg (01/13/2018), 2.3 mg (01/15/2018), 2.3 mg (02/08/2018), 2.3 mg (02/09/2018), 2.3 mg (02/10/2018), 2.3 mg (02/11/2018), 2.3 mg (02/12/2018), 2.3 mg (03/08/2018), 2.3 mg (03/09/2018), 2.3 mg (03/10/2018), 2.3 mg (03/11/2018), 2.3 mg (03/12/2018), 2.2 mg (04/05/2018), 2.2 mg (04/06/2018), 2.2 mg (04/07/2018), 2.2 mg (04/08/2018), 2.2 mg (04/09/2018), 2.2 mg (05/03/2018), 2.2 mg (05/04/2018), 2.2 mg (05/05/2018), 2.2 mg (05/06/2018), 2.2 mg (05/07/2018), 2.2 mg (05/31/2018), 2.2 mg (06/01/2018), 2.2 mg (06/02/2018), 2.2 mg (06/03/2018), 2.2 mg (06/04/2018), 2.2 mg (06/28/2018), 2.2 mg (06/29/2018), 2.2 mg (06/30/2018), 2.2 mg (07/01/2018), 2.2  mg (07/02/2018), 2.2 mg (07/26/2018), 2.2 mg (07/27/2018), 2.2 mg (07/28/2018), 2.2 mg (07/29/2018), 2.2 mg (07/30/2018), 2.2 mg (08/23/2018), 2.2 mg (08/24/2018), 2.2 mg (08/25/2018), 2.2 mg (08/26/2018), 2.2 mg (08/27/2018), 2.2 mg (09/20/2018), 2.2  mg (09/21/2018), 2.2 mg (09/22/2018), 2.2 mg (09/23/2018), 2.2 mg (09/24/2018), 2.2 mg (10/25/2018), 2.2 mg (10/26/2018), 2.2 mg (10/27/2018), 2.2 mg (10/28/2018), 2.2 mg (10/29/2018), 2.2 mg (11/22/2018), 2.2 mg (11/23/2018), 2.2 mg (11/24/2018), 2.2 mg (11/25/2018), 2.2 mg (11/26/2018), 2.2 mg (12/20/2018), 2.2 mg (12/21/2018), 2.2 mg (12/22/2018), 2.2 mg (12/23/2018), 2.2 mg (12/24/2018), 2.2 mg (01/17/2019), 2.2 mg (01/18/2019), 2.2 mg (01/19/2019), 2.2 mg (01/20/2019), 2.2 mg (01/21/2019), 2.2 mg (02/14/2019), 2.2 mg (02/15/2019), 2.2 mg (02/16/2019), 2.2 mg (02/17/2019), 2.2 mg (02/18/2019), 2.2 mg (03/14/2019), 2.2 mg (03/15/2019), 2.2 mg (03/16/2019), 2.2 mg (03/17/2019), 2.2 mg (03/18/2019), 2.2 mg (04/11/2019), 2.2 mg (04/12/2019), 2.2 mg (04/13/2019), 2.2 mg (04/14/2019), 2.2 mg (04/15/2019), 2.2 mg (05/09/2019), 2.2 mg (05/10/2019), 2.2 mg (05/11/2019), 2.2 mg (05/12/2019), 2.2 mg (05/13/2019), 2.2 mg (06/13/2019), 2.2 mg (06/14/2019), 2.2 mg (06/15/2019), 2.2 mg (06/16/2019), 2.2 mg (06/17/2019), 2.2 mg (07/18/2019), 2.2 mg (07/19/2019), 2.2 mg (07/20/2019), 2.2 mg (07/21/2019), 2.2 mg (07/22/2019), 2.2 mg (08/15/2019), 2.2 mg (08/16/2019), 2.2 mg (08/17/2019), 2.2 mg (08/18/2019), 2.2 mg (08/19/2019), 2.2 mg (09/12/2019), 2.2 mg (09/13/2019), 2.2 mg (09/14/2019), 2.2 mg (09/15/2019), 2.2 mg (09/16/2019), 2.2 mg (10/17/2019), 2.2 mg (10/18/2019), 2.2 mg (10/19/2019), 2.2 mg (10/20/2019), 2.2 mg (10/21/2019), 2.2 mg (11/14/2019), 2.2 mg (11/15/2019), 2.2 mg (11/16/2019), 2.2 mg (11/17/2019), 2.2 mg (11/18/2019) ondansetron (ZOFRAN) 4 mg in sodium chloride 0.9 % 50 mL IVPB, , Intravenous,  Once, 25 of 26 cycles Administration:  (02/08/2018),  (02/09/2018),  (02/10/2018),  (02/11/2018),  (02/12/2018),  (03/08/2018),  (03/09/2018),  (03/10/2018),  (03/11/2018),  (03/12/2018),   (04/05/2018),  (04/06/2018),  (04/07/2018),  (04/08/2018),  (04/09/2018),  (05/03/2018),  (05/04/2018),  (05/05/2018),  (05/06/2018),  (05/07/2018),  (05/31/2018),  (06/01/2018),  (06/02/2018),  (06/03/2018),  (06/04/2018),  (06/28/2018),  (06/29/2018),  (06/30/2018),  (07/01/2018),  (07/02/2018),  (07/26/2018),  (07/27/2018),  (07/28/2018),  (07/29/2018),  (07/30/2018),  (08/23/2018),  (08/24/2018),  (08/25/2018),  (08/26/2018),  (08/27/2018),  (09/20/2018),  (09/21/2018),  (09/22/2018),  (09/23/2018),  (09/24/2018),  (10/25/2018),  (10/26/2018),  (10/27/2018),  (10/28/2018),  (10/29/2018),  (11/22/2018),  (11/23/2018),  (11/24/2018),  (11/25/2018),  (11/26/2018),  (12/20/2018),  (12/21/2018),  (12/22/2018),  (12/23/2018),  (12/24/2018),  (01/17/2019),  (01/18/2019),  (01/19/2019),  (01/20/2019),  (01/21/2019),  (02/14/2019),  (02/15/2019),  (02/16/2019),  (02/17/2019),  (02/18/2019),  (03/14/2019),  (03/15/2019),  (03/16/2019),  (03/17/2019),  (03/18/2019),  (04/11/2019),  (04/12/2019),  (04/13/2019),  (04/14/2019),  (04/15/2019),  (05/09/2019),  (05/10/2019),  (05/11/2019),  (05/12/2019),  (05/13/2019),  (06/13/2019),  (06/14/2019),  (06/15/2019),  (06/16/2019),  (06/17/2019),  (07/18/2019),  (07/19/2019),  (07/20/2019),  (07/21/2019),  (07/22/2019),  (08/15/2019),  (08/16/2019),  (08/17/2019),  (08/18/2019),  (08/19/2019),  (09/12/2019),  (09/13/2019),  (09/14/2019),  (09/15/2019),  (09/16/2019),  (10/17/2019),  (10/18/2019),  (10/19/2019),  (10/20/2019),  (10/21/2019),  (11/14/2019),  (11/15/2019),  (11/16/2019),  (11/17/2019),  (11/18/2019)  for chemotherapy treatment.    12/22/2019 -  Chemotherapy   The patient had palonosetron (ALOXI) injection 0.25 mg, 0.25 mg, Intravenous,  Once, 3 of 8 cycles Administration: 0.25 mg (12/22/2019), 0.25 mg (01/13/2020), 0.25 mg (02/06/2020) lurbinectedin (ZEPZELCA) 5.8 mg in sodium chloride 0.9 % 250 mL chemo infusion, 3.2 mg/m2 = 5.8 mg, Intravenous,  Once, 3 of 8 cycles Administration: 5.8 mg (12/22/2019), 5.8 mg (01/13/2020), 5.8 mg (02/06/2020)  for chemotherapy treatment.      CANCER  STAGING: Cancer Staging No matching staging information was found for the patient.  INTERVAL HISTORY:  Mr. [name], a 69 y.o. male, returns for routine follow-up and consideration for next cycle of chemotherapy. [name] was last seen on 02/06/2020.  Due for cycle #4 of lurbinectedin today.   Overall, he tells me he has been feeling pretty well. He continues experiencing fatigue after treatments, but otherwise is tolerating them well. He reports that his appetite is depleted and can sometimes go 3 days without eating, so he is forcing himself to eat. He takes 1.5 tablets of Norco for the pain and is not reporting constipation. He drinks Ensure occasionally, but he makes smoothies with plant protein every day.  Overall, he feels ready for next cycle of chemo today.    REVIEW OF SYSTEMS:  Review of Systems  Constitutional: Positive for appetite change (depleted) and fatigue (mild).  Gastrointestinal: Negative for constipation.  Neurological: Positive for numbness (feet).  All other systems reviewed and are negative.   PAST MEDICAL/SURGICAL HISTORY:  Past Medical History:  Diagnosis Date  . Anxiety   . CAD (coronary artery disease)   . Cancer ([place])    stage 4 small cell lung cancer   . COPD (chronic obstructive pulmonary disease) ([place])   . Depression   . Dyspnea    increased exertion  . Feeling of chest tightness   . Heart palpitations   . History of chemotherapy   . Myocardial infarction ([place])   . Osteopenia   . Panic attacks   . Smoker    Past Surgical History:  Procedure Laterality Date  . BACK SURGERY  12/24/2000   L5,S1  . CORONARY STENT PLACEMENT  2005   RCA & CX  . HERNIA REPAIR Right 1980's  . INGUINAL HERNIA REPAIR  12/1978   right side  . IR FLUORO GUIDE PORT INSERTION RIGHT  04/02/2017  . IR US GUIDE BX ASP/DRAIN  02/03/2017  . IR US GUIDE VASC ACCESS RIGHT  04/02/2017  . NM MYOCAR PERF WALL MOTION  09/07/2009   No ischemia; EF 51%  . SHOULDER SURGERY Left  08/2010  . SPINE SURGERY  2002   L5-S1    SOCIAL HISTORY:  Social History   Socioeconomic History  . Marital status: Single    Spouse name: Not on file  . Number of children: Not on file  . Years of education: Not on file  . Highest education level: Not on file  Occupational History  . Not on file  Tobacco Use  . Smoking status: Current Every Day Smoker    Packs/day: 1.00    Types: Cigarettes  . Smokeless tobacco: Never Used  Vaping Use  . Vaping Use: Never used  Substance and Sexual Activity  . Alcohol use: Yes    Comment: occas  . Drug use: No  . Sexual activity: Not on file  Other Topics Concern  . Not on file  Social History Narrative  . Not on file   Social Determinants of Health   Financial Resource Strain:   . Difficulty of Paying Living Expenses:   Food Insecurity:   . Worried About Charity fundraiser in the Last Year:   . Arboriculturist in the Last Year:   Transportation Needs:   . Film/video editor (Medical):   Marland Kitchen Lack of Transportation (Non-Medical):   Physical Activity:   . Days of Exercise per Week:   . Minutes of Exercise per Session:   Stress:   . Feeling of Stress :   Social Connections:   . Frequency of Communication with Friends and Family:   . Frequency of Social Gatherings with Friends and Family:   . Attends Religious Services:   . Active  Member of Clubs or Organizations:   . Attends Archivist Meetings:   Marland Kitchen Marital Status:   Intimate Partner Violence:   . Fear of Current or Ex-Partner:   . Emotionally Abused:   Marland Kitchen Physically Abused:   . Sexually Abused:     FAMILY HISTORY:  Family History  Problem Relation Age of Onset  . Heart attack Father   . Kidney disease Father        renal failure  . Heart failure Mother   . Heart attack Mother   . Cancer Brother   . Diabetes Brother   . Alcohol abuse Brother   . Diabetes Sister     CURRENT MEDICATIONS:  Current Outpatient Medications  Medication Sig Dispense  Refill  . albuterol (VENTOLIN HFA) 108 (90 Base) MCG/ACT inhaler Inhale 2 puffs into the lungs every 6 (six) hours as needed for wheezing or shortness of breath.     . ALPRAZolam (XANAX) 1 MG tablet TAKE (1) TABLET BY MOUTH (4) TIMES DAILY. 120 tablet 0  . Ascorbic Acid (VITAMIN C) 1000 MG tablet Take 1,000 mg by mouth daily.    Marland Kitchen aspirin EC 81 MG tablet Take 81 mg by mouth daily.    Marland Kitchen atorvastatin (LIPITOR) 40 MG tablet TAKE (1) TABLET BY MOUTH ONCE DAILY. 90 tablet 1  . calcium-vitamin D (OSCAL WITH D) 250-125 MG-UNIT tablet Take 1 tablet by mouth daily.    . cholecalciferol (VITAMIN D3) 25 MCG (1000 UT) tablet Take 1,000 Units by mouth daily.    . clotrimazole-betamethasone (LOTRISONE) cream Apply 1 application topically 2 (two) times daily. 30 g 0  . docusate sodium (COLACE) 100 MG capsule Take two capsules at bedtime starting the day you receive chemotherapy and then for four days after 30 capsule 3  . gabapentin (NEURONTIN) 300 MG capsule Take 2 capsules (600 mg total) by mouth 3 (three) times daily. 180 capsule 3  . hydrOXYzine (ATARAX/VISTARIL) 50 MG tablet TAKE ONE TABLET BY MOUTH AT BEDTIME AS NEEDED FOR SLEEP. 30 tablet 0  . Multiple Vitamins-Minerals (CENTRUM SILVER 50+MEN) TABS Take 1 tablet by mouth daily.    Marland Kitchen oxyCODONE-acetaminophen (PERCOCET/ROXICET) 5-325 MG tablet TAKE 1 TABLET EVERY 8 HOURS AS NEEDED FOR SEVERE PAIN. 90 tablet 0  . Pegfilgrastim (NEULASTA ONPRO [place]) Inject into the skin. Every 21 days    . senna (SENOKOT) 8.6 MG tablet Take 3 tablets by mouth daily.     . topotecan in sodium chloride 0.9 % 100 mL Inject into the vein once. Days 1-5 every 21 days    . vitamin B-12 (CYANOCOBALAMIN) 100 MCG tablet Take 100 mcg by mouth daily.    . ondansetron (ZOFRAN) 8 MG tablet Take 1 tablet (8 mg total) by mouth 2 (two) times daily as needed. Start on the third day after cisplatin chemotherapy. (Patient not taking: Reported on 02/06/2020) 30 tablet 1  . prochlorperazine  (COMPAZINE) 10 MG tablet Take 1 tablet (10 mg total) by mouth every 6 (six) hours as needed (Nausea or vomiting). (Patient not taking: Reported on 02/06/2020) 60 tablet 1   No current facility-administered medications for this visit.    ALLERGIES:  Allergies  Allergen Reactions  . Codeine Nausea Only  . Niaspan [Niacin Er]     PHYSICAL EXAM:  Performance status (ECOG): 1 - Symptomatic but completely ambulatory  Vitals:   02/27/20 0954  BP: (!) 137/55  Pulse: (!) 57  Resp: 17  Temp: (!) 97.1 F (36.2 C)  SpO2: 97%  Wt Readings from Last 3 Encounters:  02/27/20 146 lb 6.4 oz (66.4 kg)  02/06/20 147 lb 8 oz (66.9 kg)  01/13/20 147 lb 4.8 oz (66.8 kg)   Physical Exam Vitals reviewed.  Constitutional:      Appearance: Normal appearance.  Cardiovascular:     Rate and Rhythm: Normal rate and regular rhythm.     Pulses: Normal pulses.     Heart sounds: Normal heart sounds.  Pulmonary:     Effort: Pulmonary effort is normal.     Breath sounds: Normal breath sounds.  Lymphadenopathy:     Cervical: No cervical adenopathy.     Upper Body:     Right upper body: No supraclavicular adenopathy.     Left upper body: No supraclavicular adenopathy.  Neurological:     General: No focal deficit present.     Mental Status: He is alert and oriented to person, place, and time.  Psychiatric:        Mood and Affect: Mood normal.        Behavior: Behavior normal.     LABORATORY DATA:  I have reviewed the labs as listed.  CBC Latest Ref Rng & Units 02/27/2020 02/06/2020 01/13/2020  WBC 4.0 - 10.5 K/uL 3.6(L) 6.3 5.1  Hemoglobin 13.0 - 17.0 g/dL 9.9(L) 10.5(L) 9.9(L)  Hematocrit 39 - 52 % 30.8(L) 32.9(L) 31.6(L)  Platelets 150 - 400 K/uL 268 263 226   CMP Latest Ref Rng & Units 02/27/2020 02/06/2020 01/13/2020  Glucose 70 - 99 mg/dL 101(H) 103(H) 107(H)  BUN 8 - 23 mg/dL 18 16 27(H)  Creatinine 0.61 - 1.24 mg/dL 0.98 0.97 0.96  Sodium 135 - 145 mmol/L 136 136 138  Potassium 3.5 - 5.1  mmol/L 4.1 4.3 4.0  Chloride 98 - 111 mmol/L 106 103 104  CO2 22 - 32 mmol/L _0 Calcium 8.9 - 10.3 mg/dL 8.7(L) 9.0 8.8(L)  Total Protein 6.5 - 8.1 g/dL 6.6 6.9 6.5  Total Bilirubin 0.3 - 1.2 mg/dL 0.5 0.6 0.5  Alkaline Phos 38 - 126 U/L 48 53 53  AST 15 - 41 U/L _1 ALT 0 - 44 U/L _2 DIAGNOSTIC IMAGING:  I have independently reviewed the scans and discussed with the patient. CT SOFT TISSUE NECK W CONTRAST  Result Date: 02/23/2020 CLINICAL DATA:  Metastatic small cell lung cancer. Positive right neck lymph node biopsy 02/03/2017. EXAM: CT NECK WITH CONTRAST TECHNIQUE: Multidetector CT imaging of the neck was performed using the standard protocol following the bolus administration of intravenous contrast. CONTRAST:  152m OMNIPAQUE IOHEXOL 300 MG/ML  SOLN COMPARISON:  12/15/2019 FINDINGS: Pharynx and larynx: There is a necrotic or 2 adjacent necrotic retropharyngeal lymph nodes on the left at the level of the nasopharynx measuring in total 20 x 14 mm. This is much more likely to represent necrotic adenopathy rather than a primary nasopharyngeal mass. No other mucosal or submucosal lesion is evident. Salivary glands: Parotid and submandibular glands are normal. Thyroid: Diminutive thyroid tissue.  No mass. Lymph nodes: Marked reduction of lower right neck and right supraclavicular lymphadenopathy. Index node measured previously at 3 cm in size is essentially completely resolved, now visible as only a small area of skin thickening just posterior to the right jugular vein. Similarly, supraclavicular region lymph nodes appear to have resolved. No other new or enlarging node is demonstrated. Vascular: No significant vascular finding. Ordinary atherosclerotic calcification at the carotid bifurcations. Limited intracranial: Normal Visualized orbits: Normal  Mastoids and visualized paranasal sinuses: Clear Skeleton: Normal Upper chest: Scarring and emphysema at the lung apices. Aortic  atherosclerosis. Other: None IMPRESSION: Marked reduction of lower right neck and right supraclavicular lymphadenopathy. Index node measured previously at 3 cm in size is essentially completely resolved, now visible as only a small area of skin thickening just posterior to the right jugular vein. Similarly, supraclavicular region lymph nodes appear to have resolved. There is a necrotic node or 2 adjacent necrotic retropharyngeal lymph nodes on the left at the level of the nasopharynx (nodes of [place]). This is much more likely to represent necrotic adenopathy rather than a primary nasopharyngeal mass. Aortic Atherosclerosis (ICD10-I70.0) and Emphysema (ICD10-J43.9). Electronically Signed   By: Nelson Chimes M.D.   On: 02/23/2020 12:45   CT Chest W Contrast  Result Date: 02/23/2020 CLINICAL DATA:  Extensive stage small cell lung cancer diagnosed July 2018. Restaging. EXAM: CT CHEST, ABDOMEN, AND PELVIS WITH CONTRAST TECHNIQUE: Multidetector CT imaging of the chest, abdomen and pelvis was performed following the standard protocol during bolus administration of intravenous contrast. CONTRAST:  19m OMNIPAQUE IOHEXOL 300 MG/ML  SOLN COMPARISON:  12/15/2019 CT chest, abdomen and pelvis. FINDINGS: CT CHEST FINDINGS Cardiovascular: Normal heart size. No significant pericardial effusion/thickening. Three-vessel coronary atherosclerosis. Right internal jugular Port-A-Cath terminates at the cavoatrial junction. Atherosclerotic nonaneurysmal thoracic aorta. Normal caliber pulmonary arteries. No central pulmonary emboli. Mediastinum/Nodes: No discrete thyroid nodules. Unremarkable esophagus. No axillary adenopathy. Previously described high right paratracheal and left prevascular lymphadenopathy has resolved. No new or residual pathologically enlarged mediastinal or hilar lymph nodes. Lungs/Pleura: No pneumothorax. No pleural effusion. Severe centrilobular and paraseptal emphysema with diffuse bronchial wall thickening.  No acute consolidative airspace disease or lung masses. Scattered bilateral irregular pulmonary nodules are all stable, including 0.3 cm medial right upper lobe nodule (series 4/image 42), peribronchovascular 1.6 cm right lower lobe nodule (series 4/image 120) and clustered left lower lobe nodules up to 1.0 cm (series 4/image 115). No new significant pulmonary nodules. Musculoskeletal: No aggressive appearing focal osseous lesions. Mild thoracic spondylosis. CT ABDOMEN PELVIS FINDINGS Hepatobiliary: Normal liver size. Subcentimeter hypodense inferior left liver lesion is too small to characterize and is unchanged, presumably benign. No new liver lesions. Contracted gallbladder with no radiopaque cholelithiasis. No biliary ductal dilatation. Pancreas: Normal, with no mass or duct dilation. Spleen: Normal size. No mass. Adrenals/Urinary Tract: Normal adrenals. Simple 2.5 cm medial upper left renal cyst. Subcentimeter hypodense renal cortical lesions in both kidneys are too small to characterize and not appreciably changed. No hydronephrosis. Normal bladder. Stomach/Bowel: Normal non-distended stomach. Normal caliber small bowel with no small bowel wall thickening. Normal appendix. Oral contrast transits to the colon. Mild sigmoid diverticulosis, with no large bowel wall thickening or significant pericolonic fat stranding. Vascular/Lymphatic: Atherosclerotic nonaneurysmal abdominal aorta. Patent portal, splenic, hepatic and renal veins. No pathologically enlarged lymph nodes in the abdomen or pelvis. Reproductive: Normal size prostate. Other: No pneumoperitoneum, ascites or focal fluid collection. Musculoskeletal: No aggressive appearing focal osseous lesions. Moderate lower lumbar spondylosis. IMPRESSION: 1. Interval treatment response with resolved mediastinal adenopathy. No evidence of new or progressive metastatic disease in the chest, abdomen or pelvis. Scattered bilateral irregular pulmonary nodules are all  stable. 2. Aortic Atherosclerosis (ICD10-I70.0) and Emphysema (ICD10-J43.9). Electronically Signed   By: JIlona SorrelM.D.   On: 02/23/2020 13:21   CT Abdomen Pelvis W Contrast  Result Date: 02/23/2020 CLINICAL DATA:  Extensive stage small cell lung cancer diagnosed July 2018. Restaging. EXAM: CT CHEST, ABDOMEN, AND PELVIS WITH  CONTRAST TECHNIQUE: Multidetector CT imaging of the chest, abdomen and pelvis was performed following the standard protocol during bolus administration of intravenous contrast. CONTRAST:  122m OMNIPAQUE IOHEXOL 300 MG/ML  SOLN COMPARISON:  12/15/2019 CT chest, abdomen and pelvis. FINDINGS: CT CHEST FINDINGS Cardiovascular: Normal heart size. No significant pericardial effusion/thickening. Three-vessel coronary atherosclerosis. Right internal jugular Port-A-Cath terminates at the cavoatrial junction. Atherosclerotic nonaneurysmal thoracic aorta. Normal caliber pulmonary arteries. No central pulmonary emboli. Mediastinum/Nodes: No discrete thyroid nodules. Unremarkable esophagus. No axillary adenopathy. Previously described high right paratracheal and left prevascular lymphadenopathy has resolved. No new or residual pathologically enlarged mediastinal or hilar lymph nodes. Lungs/Pleura: No pneumothorax. No pleural effusion. Severe centrilobular and paraseptal emphysema with diffuse bronchial wall thickening. No acute consolidative airspace disease or lung masses. Scattered bilateral irregular pulmonary nodules are all stable, including 0.3 cm medial right upper lobe nodule (series 4/image 42), peribronchovascular 1.6 cm right lower lobe nodule (series 4/image 120) and clustered left lower lobe nodules up to 1.0 cm (series 4/image 115). No new significant pulmonary nodules. Musculoskeletal: No aggressive appearing focal osseous lesions. Mild thoracic spondylosis. CT ABDOMEN PELVIS FINDINGS Hepatobiliary: Normal liver size. Subcentimeter hypodense inferior left liver lesion is too small to  characterize and is unchanged, presumably benign. No new liver lesions. Contracted gallbladder with no radiopaque cholelithiasis. No biliary ductal dilatation. Pancreas: Normal, with no mass or duct dilation. Spleen: Normal size. No mass. Adrenals/Urinary Tract: Normal adrenals. Simple 2.5 cm medial upper left renal cyst. Subcentimeter hypodense renal cortical lesions in both kidneys are too small to characterize and not appreciably changed. No hydronephrosis. Normal bladder. Stomach/Bowel: Normal non-distended stomach. Normal caliber small bowel with no small bowel wall thickening. Normal appendix. Oral contrast transits to the colon. Mild sigmoid diverticulosis, with no large bowel wall thickening or significant pericolonic fat stranding. Vascular/Lymphatic: Atherosclerotic nonaneurysmal abdominal aorta. Patent portal, splenic, hepatic and renal veins. No pathologically enlarged lymph nodes in the abdomen or pelvis. Reproductive: Normal size prostate. Other: No pneumoperitoneum, ascites or focal fluid collection. Musculoskeletal: No aggressive appearing focal osseous lesions. Moderate lower lumbar spondylosis. IMPRESSION: 1. Interval treatment response with resolved mediastinal adenopathy. No evidence of new or progressive metastatic disease in the chest, abdomen or pelvis. Scattered bilateral irregular pulmonary nodules are all stable. 2. Aortic Atherosclerosis (ICD10-I70.0) and Emphysema (ICD10-J43.9). Electronically Signed   By: JIlona SorrelM.D.   On: 02/23/2020 13:21     ASSESSMENT:  1. 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 -CT CAP on 12/15/2019 showed enlarging prevascular and right paratracheal lymph nodes. Stable left lower lobe lung nodules. -CT neck on 12/15/2019 showed malignant lymphadenopathy in the lower right posterior triangle and jugular chain. -Lurbinectidin 3.288mkgstarted on 12/22/2019. -CT neck on 02/23/2020 showed marked  reduction of lower right neck and right supraclavicular adenopathy. -CT CAP on 02/23/2020 showed treatment response with resolved mediastinal adenopathy.  No evidence of new or progressive metastatic disease.  Scattered bilateral pulmonary nodules.   PLAN:  1. 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 -I have reviewed his labs today.  White count is 3.6 with normal ANC.  Platelet count is normal.  LFTs are normal. -He will proceed with his next cycle of treatment.  We will see him back in 3 weeks for follow-up.  2. Normocytic anemia: -His hemoglobin is 9.9.  Will check ferritin and iron panel.  3. Chronic pain: -Continue Percocet 1 and half tablet twice  daily.  4. Peripheral neuropathy: -Continue gabapentin 600 mg 3 times daily.  5.  Sleeping difficulty: -He is taking Vistaril as needed.   Orders placed this encounter:  No orders of the defined types were placed in this encounter.    Derek Jack, MD [place] [phone]   I, Milinda Antis, am acting as a scribe for Dr. Sanda Linger.  I, Derek Jack MD, have reviewed the above documentation for accuracy and completeness, and I agree with the above.

## 2020-02-27 NOTE — Progress Notes (Signed)
Patient has been assessed by Dr. Delton Coombes, labs reviewed. Okay to proceed with treatment today.

## 2020-02-27 NOTE — Progress Notes (Signed)
Patient tolerated chemotherapy with no complaints voiced.  Side effects with management reviewed with understanding verbalized.  Port site clean and dry with no bruising or swelling noted at site.  Good blood return noted before and after administration of chemotherapy.  Band aid applied.  Patient left in satisfactory condition with VSS and no s/s of distress noted.   

## 2020-02-27 NOTE — Patient Instructions (Signed)
[place] at [facility] Discharge Instructions  You were seen today by Dr. Delton Coombes. He went over your recent results and scans. You received your treatment today. Dr. Delton Coombes will see you back in for 3 weeks labs and follow up.   Thank you for choosing [place] at [facility] to provide your oncology and hematology care.  To afford each patient quality time with our provider, please arrive at least 15 minutes before your scheduled appointment time.   If you have a lab appointment with the [facility] please come in thru the Main Entrance and check in at the main information desk  You need to re-schedule your appointment should you arrive 10 or more minutes late.  We strive to give you quality time with our providers, and arriving late affects you and other patients whose appointments are after yours.  Also, if you no show three or more times for appointments you may be dismissed from the clinic at the providers discretion.     Again, thank you for choosing [facility].  Our hope is that these requests will decrease the amount of time that you wait before being seen by our physicians.       _____________________________________________________________  Should you have questions after your visit to [facility], please contact our office at [phone] between the hours of 8:00 a.m. and 4:30 p.m.  Voicemails left after 4:00 p.m. will not be returned until the following business day.  For prescription refill requests, have your pharmacy contact our office and allow 72 hours.    Cancer Center Support Programs:   > Cancer Support Group  2nd Tuesday of the month 1pm-2pm, Journey Room

## 2020-03-01 ENCOUNTER — Ambulatory Visit (INDEPENDENT_AMBULATORY_CARE_PROVIDER_SITE_OTHER): Payer: Medicare HMO | Admitting: Family Medicine

## 2020-03-01 ENCOUNTER — Other Ambulatory Visit: Payer: Self-pay

## 2020-03-01 VITALS — BP 110/50 | HR 56 | Temp 98.7°F | Ht 69.0 in | Wt 146.0 lb

## 2020-03-01 DIAGNOSIS — I2510 Atherosclerotic heart disease of native coronary artery without angina pectoris: Secondary | ICD-10-CM | POA: Diagnosis not present

## 2020-03-01 DIAGNOSIS — C3490 Malignant neoplasm of unspecified part of unspecified bronchus or lung: Secondary | ICD-10-CM

## 2020-03-01 DIAGNOSIS — F411 Generalized anxiety disorder: Secondary | ICD-10-CM

## 2020-03-01 DIAGNOSIS — E7800 Pure hypercholesterolemia, unspecified: Secondary | ICD-10-CM | POA: Diagnosis not present

## 2020-03-01 MED ORDER — ALPRAZOLAM 1 MG PO TABS: 1.0000 mg | ORAL_TABLET | Freq: Four times a day (QID) | ORAL | 0 refills | Status: DC | PRN

## 2020-03-01 NOTE — Progress Notes (Signed)
Subjective:    Patient ID: [name], male    DOB: 04-Dec-1950, 69 y.o.   MRN: [id number]  HPI  Patient is a very pleasant 69 year old Caucasian male here today for follow-up of his generalized anxiety disorder.  He has a history of coronary artery disease.  He has a history of tobacco abuse.  He has a history of stage IV metastatic small cell lung cancer.  He recently had to switch chemotherapy drugs due to progression of his cancer however thankfully since the switch his cancer has been stagnant.  He has had his Covid vaccination.  He continues to take Xanax 1 mg 4 times a day.  He has a longstanding history of Xanax and at one point was on 2 mg 4 times a day however I been able to successfully wean him to 1 mg.  He still takes oxycodone occasionally for low back pain.  He also has pain radiating up and down his back for which he takes gabapentin.  He denies any dizziness.  He denies any sedation.  He denies any confusion or delirium.  I explained the risk of taking Xanax and pain medication together however given his present situation the patient is willing to accept those risk for comfort.  Labs were recently checked at his oncologist.  This includes a CMP that was normal.  He did have pancytopenia on his CBC due to his chemotherapy.  He has not had a cholesterol checked in quite some time since March 2020.  At that time his LDL cholesterol was 50 on the atorvastatin down from over 140 the year prior.  He seems to be tolerating the atorvastatin well with no myalgias or right upper quadrant pain.  Past Medical History:  Diagnosis Date  . Anxiety   . CAD (coronary artery disease)   . Cancer ([place])    stage 4 small cell lung cancer   . COPD (chronic obstructive pulmonary disease) ([place])   . Depression   . Dyspnea    increased exertion  . Feeling of chest tightness   . Heart palpitations   . History of chemotherapy   . Myocardial infarction ([place])   . Osteopenia   . Panic attacks   .  Smoker    Past Surgical History:  Procedure Laterality Date  . BACK SURGERY  12/24/2000   L5,S1  . CORONARY STENT PLACEMENT  2005   RCA & CX  . HERNIA REPAIR Right 1980's  . INGUINAL HERNIA REPAIR  12/1978   right side  . IR FLUORO GUIDE PORT INSERTION RIGHT  04/02/2017  . IR US GUIDE BX ASP/DRAIN  02/03/2017  . IR US GUIDE VASC ACCESS RIGHT  04/02/2017  . NM MYOCAR PERF WALL MOTION  09/07/2009   No ischemia; EF 51%  . SHOULDER SURGERY Left 08/2010  . SPINE SURGERY  2002   L5-S1   Current Outpatient Medications on File Prior to Visit  Medication Sig Dispense Refill  . albuterol (VENTOLIN HFA) 108 (90 Base) MCG/ACT inhaler Inhale 2 puffs into the lungs every 6 (six) hours as needed for wheezing or shortness of breath.     . Ascorbic Acid (VITAMIN C) 1000 MG tablet Take 1,000 mg by mouth daily.    Marland Kitchen aspirin EC 81 MG tablet Take 81 mg by mouth daily.    Marland Kitchen atorvastatin (LIPITOR) 40 MG tablet TAKE (1) TABLET BY MOUTH ONCE DAILY. 90 tablet 1  . calcium-vitamin D (OSCAL WITH D) 250-125 MG-UNIT tablet Take 1 tablet  by mouth daily.    . cholecalciferol (VITAMIN D3) 25 MCG (1000 UT) tablet Take 1,000 Units by mouth daily.    . clotrimazole-betamethasone (LOTRISONE) cream Apply 1 application topically 2 (two) times daily. 30 g 0  . docusate sodium (COLACE) 100 MG capsule Take two capsules at bedtime starting the day you receive chemotherapy and then for four days after 30 capsule 3  . gabapentin (NEURONTIN) 300 MG capsule Take 2 capsules (600 mg total) by mouth 3 (three) times daily. 180 capsule 3  . hydrOXYzine (ATARAX/VISTARIL) 50 MG tablet TAKE ONE TABLET BY MOUTH AT BEDTIME AS NEEDED FOR SLEEP. 30 tablet 0  . Multiple Vitamins-Minerals (CENTRUM SILVER 50+MEN) TABS Take 1 tablet by mouth daily.    Marland Kitchen oxyCODONE-acetaminophen (PERCOCET/ROXICET) 5-325 MG tablet TAKE 1 TABLET EVERY 8 HOURS AS NEEDED FOR SEVERE PAIN. 90 tablet 0  . Pegfilgrastim (NEULASTA ONPRO [place]) Inject into the skin. Every 21 days     . senna (SENOKOT) 8.6 MG tablet Take 3 tablets by mouth daily.     . topotecan in sodium chloride 0.9 % 100 mL Inject into the vein once. Days 1-5 every 21 days    . vitamin B-12 (CYANOCOBALAMIN) 100 MCG tablet Take 100 mcg by mouth daily.    . ondansetron (ZOFRAN) 8 MG tablet Take 1 tablet (8 mg total) by mouth 2 (two) times daily as needed. Start on the third day after cisplatin chemotherapy. (Patient not taking: Reported on 02/06/2020) 30 tablet 1  . prochlorperazine (COMPAZINE) 10 MG tablet Take 1 tablet (10 mg total) by mouth every 6 (six) hours as needed (Nausea or vomiting). (Patient not taking: Reported on 02/06/2020) 60 tablet 1   No current facility-administered medications on file prior to visit.   Allergies  Allergen Reactions  . Codeine Nausea Only  . Niaspan Durene Cal Er]    Social History   Socioeconomic History  . Marital status: Single    Spouse name: Not on file  . Number of children: Not on file  . Years of education: Not on file  . Highest education level: Not on file  Occupational History  . Not on file  Tobacco Use  . Smoking status: Current Every Day Smoker    Packs/day: 1.00    Types: Cigarettes  . Smokeless tobacco: Never Used  Vaping Use  . Vaping Use: Never used  Substance and Sexual Activity  . Alcohol use: Yes    Comment: occas  . Drug use: No  . Sexual activity: Not on file  Other Topics Concern  . Not on file  Social History Narrative  . Not on file   Social Determinants of Health   Financial Resource Strain:   . Difficulty of Paying Living Expenses: Not on file  Food Insecurity:   . Worried About Charity fundraiser in the Last Year: Not on file  . Ran Out of Food in the Last Year: Not on file  Transportation Needs:   . Lack of Transportation (Medical): Not on file  . Lack of Transportation (Non-Medical): Not on file  Physical Activity:   . Days of Exercise per Week: Not on file  . Minutes of Exercise per Session: Not on file  Stress:    . Feeling of Stress : Not on file  Social Connections:   . Frequency of Communication with Friends and Family: Not on file  . Frequency of Social Gatherings with Friends and Family: Not on file  . Attends Religious Services: Not on file  .  Active Member of Clubs or Organizations: Not on file  . Attends Archivist Meetings: Not on file  . Marital Status: Not on file  Intimate Partner Violence:   . Fear of Current or Ex-Partner: Not on file  . Emotionally Abused: Not on file  . Physically Abused: Not on file  . Sexually Abused: Not on file     Review of Systems  All other systems reviewed and are negative.      Objective:   Physical Exam Vitals reviewed.  Constitutional:      Appearance: He is cachectic. He is not ill-appearing, toxic-appearing or diaphoretic.  Cardiovascular:     Rate and Rhythm: Normal rate and regular rhythm.     Heart sounds: Normal heart sounds.  Pulmonary:     Effort: Pulmonary effort is normal.     Breath sounds: Normal breath sounds.  Neurological:     General: No focal deficit present.     Mental Status: He is alert and oriented to person, place, and time. Mental status is at baseline.     Cranial Nerves: No cranial nerve deficit.  Psychiatric:        Mood and Affect: Mood normal.        Behavior: Behavior normal.        Thought Content: Thought content normal.        Judgment: Judgment normal.           Assessment & Plan:  Hypercholesterolemia  GAD (generalized anxiety disorder) - Plan: ALPRAZolam (XANAX) 1 MG tablet  Coronary artery disease involving native heart without angina pectoris, unspecified vessel or lesion type  Extensive stage primary small cell carcinoma of lung (HCC)  Overall, the patient continues to do relatively well battling his cancer now 3 years out from diagnosis.  He is currently stable and being followed closely by his oncologist.  I will continue to use the Xanax 1 mg 4 times a day as needed for  anxiety and insomnia.  I will continue the patient on Lipitor for his coronary artery disease along with his aspirin.  Cautioned the patient about possible falls, dizziness, and sedation from the Xanax however he is tolerant to the medication and he denies any side effects from such.  Recommended a booster on his Covid vaccination 8 months after his second dose

## 2020-03-02 ENCOUNTER — Other Ambulatory Visit: Payer: Self-pay | Admitting: Family Medicine

## 2020-03-02 ENCOUNTER — Other Ambulatory Visit (HOSPITAL_COMMUNITY): Payer: Self-pay | Admitting: Nurse Practitioner

## 2020-03-02 DIAGNOSIS — C3490 Malignant neoplasm of unspecified part of unspecified bronchus or lung: Secondary | ICD-10-CM

## 2020-03-15 DIAGNOSIS — R531 Weakness: Secondary | ICD-10-CM | POA: Diagnosis not present

## 2020-03-15 DIAGNOSIS — R4020 Unspecified coma: Secondary | ICD-10-CM | POA: Diagnosis not present

## 2020-03-15 DIAGNOSIS — R0902 Hypoxemia: Secondary | ICD-10-CM | POA: Diagnosis not present

## 2020-03-20 ENCOUNTER — Inpatient Hospital Stay (HOSPITAL_COMMUNITY): Payer: Medicare HMO | Attending: Hematology

## 2020-03-20 ENCOUNTER — Inpatient Hospital Stay (HOSPITAL_COMMUNITY): Payer: Medicare HMO

## 2020-03-20 ENCOUNTER — Encounter (HOSPITAL_COMMUNITY): Payer: Self-pay | Admitting: Hematology

## 2020-03-20 ENCOUNTER — Inpatient Hospital Stay (HOSPITAL_COMMUNITY): Payer: Medicare HMO | Admitting: Hematology

## 2020-03-20 ENCOUNTER — Other Ambulatory Visit: Payer: Self-pay

## 2020-03-20 VITALS — BP 140/65 | HR 58 | Temp 96.9°F | Resp 17 | Wt 147.8 lb

## 2020-03-20 VITALS — BP 132/44 | HR 52 | Temp 96.9°F | Resp 17

## 2020-03-20 DIAGNOSIS — D649 Anemia, unspecified: Secondary | ICD-10-CM | POA: Diagnosis not present

## 2020-03-20 DIAGNOSIS — C3490 Malignant neoplasm of unspecified part of unspecified bronchus or lung: Secondary | ICD-10-CM

## 2020-03-20 DIAGNOSIS — C787 Secondary malignant neoplasm of liver and intrahepatic bile duct: Secondary | ICD-10-CM | POA: Insufficient documentation

## 2020-03-20 DIAGNOSIS — Z23 Encounter for immunization: Secondary | ICD-10-CM | POA: Diagnosis not present

## 2020-03-20 DIAGNOSIS — Z5111 Encounter for antineoplastic chemotherapy: Secondary | ICD-10-CM | POA: Insufficient documentation

## 2020-03-20 LAB — COMPREHENSIVE METABOLIC PANEL
ALT: 12 U/L (ref 0–44)
AST: 15 U/L (ref 15–41)
Albumin: 3.8 g/dL (ref 3.5–5.0)
Alkaline Phosphatase: 48 U/L (ref 38–126)
Anion gap: 6 (ref 5–15)
BUN: 14 mg/dL (ref 8–23)
CO2: 25 mmol/L (ref 22–32)
Calcium: 8.8 mg/dL — ABNORMAL LOW (ref 8.9–10.3)
Chloride: 104 mmol/L (ref 98–111)
Creatinine, Ser: 0.86 mg/dL (ref 0.61–1.24)
GFR calc Af Amer: >60 mL/min (ref >60)
GFR calc non Af Amer: >60 mL/min (ref >60)
Glucose, Bld: 93 mg/dL (ref 70–99)
Potassium: 4.6 mmol/L (ref 3.5–5.1)
Sodium: 135 mmol/L (ref 135–145)
Total Bilirubin: 0.8 mg/dL (ref 0.3–1.2)
Total Protein: 6.5 g/dL (ref 6.5–8.1)

## 2020-03-20 LAB — CBC WITH DIFFERENTIAL/PLATELET
Abs Immature Granulocytes: 0.06 10*3/uL (ref 0.00–0.07)
Basophils Absolute: 0 10*3/uL (ref 0.0–0.1)
Basophils Relative: 1 %
Eosinophils Absolute: 0.1 10*3/uL (ref 0.0–0.5)
Eosinophils Relative: 1 %
HCT: 30.4 % — ABNORMAL LOW (ref 39.0–52.0)
Hemoglobin: 9.9 g/dL — ABNORMAL LOW (ref 13.0–17.0)
Immature Granulocytes: 1 %
Lymphocytes Relative: 26 %
Lymphs Abs: 1.3 10*3/uL (ref 0.7–4.0)
MCH: 31.1 pg (ref 26.0–34.0)
MCHC: 32.6 g/dL (ref 30.0–36.0)
MCV: 95.6 fL (ref 80.0–100.0)
Monocytes Absolute: 0.8 10*3/uL (ref 0.1–1.0)
Monocytes Relative: 16 %
Neutro Abs: 2.7 10*3/uL (ref 1.7–7.7)
Neutrophils Relative %: 55 %
Platelets: 267 10*3/uL (ref 150–400)
RBC: 3.18 MIL/uL — ABNORMAL LOW (ref 4.22–5.81)
RDW: 22.2 % — ABNORMAL HIGH (ref 11.5–15.5)
WBC: 4.9 10*3/uL (ref 4.0–10.5)
nRBC: 0 % (ref 0.0–0.2)

## 2020-03-20 LAB — MAGNESIUM: Magnesium: 2 mg/dL (ref 1.7–2.4)

## 2020-03-20 LAB — LACTATE DEHYDROGENASE: LDH: 135 U/L (ref 98–192)

## 2020-03-20 MED ORDER — SODIUM CHLORIDE 0.9 % IV SOLN: Freq: Once | INTRAVENOUS | Status: AC

## 2020-03-20 MED ORDER — SODIUM CHLORIDE 0.9 % IV SOLN
2.6000 mg/m2 | Freq: Once | INTRAVENOUS | Status: AC
  Administered 2020-03-20: 4.75 mg via INTRAVENOUS
  Filled 2020-03-20: qty 9.5

## 2020-03-20 MED ORDER — SODIUM CHLORIDE 0.9% FLUSH
10.0000 mL | INTRAVENOUS | Status: DC | PRN
  Administered 2020-03-20: 10 mL

## 2020-03-20 MED ORDER — SODIUM CHLORIDE 0.9 % IV SOLN
10.0000 mg | Freq: Once | INTRAVENOUS | Status: AC
  Administered 2020-03-20: 10 mg via INTRAVENOUS
  Filled 2020-03-20: qty 10

## 2020-03-20 MED ORDER — PALONOSETRON HCL INJECTION 0.25 MG/5ML
0.2500 mg | Freq: Once | INTRAVENOUS | Status: AC
  Administered 2020-03-20: 0.25 mg via INTRAVENOUS
  Filled 2020-03-20: qty 5

## 2020-03-20 MED ORDER — HEPARIN SOD (PORK) LOCK FLUSH 100 UNIT/ML IV SOLN
500.0000 [IU] | Freq: Once | INTRAVENOUS | Status: AC | PRN
  Administered 2020-03-20: 500 [IU]

## 2020-03-20 NOTE — Progress Notes (Signed)
[name], [name] [id number]   CLINIC:  Medical Oncology/Hematology  PCP:  System, Pcp Not In None None   REASON FOR VISIT:  Follow-up for SCLC with liver metastasis  PRIOR THERAPY: Topotecan x 30 cycles from 08/24/2017 to 11/18/2019.  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 Bulky 5.4 cm right supraclavicular region malignant lymph node conglomeration with extracapsular extension. 2. Surrounding smaller abnormal right level 3 and level 5 lymph nodes, and the lymphadenopathy continues into the superior mediastinum, see Chest CT findings reported separately. 3. No other metastatic disease identified in the neck.   01/16/2017 Imaging   CT chest: IMPRESSION: 1. Extensive lymphadenopathy in the thorax and lower right cervical region, as discussed above. Primary differential considerations include lymphoma/leukemia or small cell carcinoma of the lung. Further evaluation a PET-CT could be considered to assess for additional sites of disease below the diaphragm if clinically appropriate. Additionally, ultrasound-guided biopsy of supraclavicular lymphadenopathy could be considered to establish a tissue diagnosis. 2. Indeterminate lesion in the periphery of segment 8 of the liver measuring 2.7 x 1.7 cm. Attention at time of follow-up PET-CT is recommended. 3. Aortic atherosclerosis, in addition to left main and 3 vessel coronary artery disease. Please note that although the presence of coronary artery calcium documents the presence of coronary artery disease, the severity of this disease and any potential stenosis cannot be assessed on this non-gated CT examination. Assessment for potential risk factor modification, dietary therapy or pharmacologic therapy may be  warranted, if clinically indicated. 4. There are calcifications of the aortic valve. Echocardiographic correlation for evaluation of potential valvular dysfunction may be warranted if clinically indicated. 5. Diffuse bronchial wall thickening with moderate centrilobular and paraseptal emphysema; imaging findings suggestive of underlying COPD.   02/03/2017 Initial Biopsy   (R) neck lymph node biopsy: SMALL CELL CARCINOMA (most likely lung primary).    02/03/2017 Miscellaneous   Port-a-cath attempted by IR; unable to place d/t enlarged SVC.    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 Staging for metastatic disease  EXAM: MRI HEAD WITHOUT AND WITH CONTRAST  TECHNIQUE: Multiplanar, multiecho pulse sequences of the brain and surrounding structures were obtained without and with intravenous contrast.  CONTRAST:  66m MULTIHANCE GADOBENATE DIMEGLUMINE 529 MG/ML IV SOLN  COMPARISON:  None.  FINDINGS: Brain: Negative for hydrocephalus. Cerebral volume normal for age. Small nonenhancing white matter hyperintensities consistent with mild chronic microvascular ischemia. No acute infarct. Negative for hemorrhage or mass or edema  Normal enhancement postcontrast infusion. No enhancing mass lesion. Leptomeningeal enhancement is normal.  Vascular: Normal arterial flow voids.  Normal venous enhancement  Skull and upper cervical spine: Negative  Sinuses/Orbits: Negative  Other: None  IMPRESSION: Negative for metastatic disease.  No acute abnormality.  Mild chronic white matter changes.   04/07/2017 Imaging    PET:  1. Marked reduction in size and metabolic activity of bulky RIGHT supraclavicular adenopathy mediastinal lymphadenopathy. 2. Residual moderate  activity remains within small RIGHT supraclavicular lymph node, RIGHT lower paratracheal lymph node and RIGHT hilar lymph node. 3. Resolution of prevascular and internal  mammary mediastinal metastatic hypermetabolic activity. 4. Resolution of metabolic activity associated with solitary RIGHT hepatic lobe liver metastasis. 5. No evidence of disease progression. 6. 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   05/27/2017 Imaging   MRI brain w/ and w/o contrast IMPRESSION: 1. No metastatic disease identified. 2. Increased nonspecific cerebral white matter signal changes since August. These are most commonly small vessel disease related. 3. New right maxillary sinusitis. Benign appearing retention cysts in the nasopharynx with trace mastoid effusions.   06/12/2017 Imaging   PET-CT IMPRESSION: 1. There are two new hypermetabolic nodules identified within both lower lobes measuring up to 3.1 cm. The appearance is nonspecific and may be inflammatory/infectious in etiology. Pulmonary metastatic disease cannot be excluded and short-term follow-up imaging in 3 months is advised to reassess these nodules. 2. Stable appearance of mild hypermetabolic activity associated with right paratracheal and right hilar lymph nodes. 3. Decrease in FDG uptake associated with index right supraclavicular lymph node. 4. 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 5. Aortic Atherosclerosis (ICD10-I70.0) and Emphysema (ICD10-J43.9).   08/24/2017 - 12/18/2019 Chemotherapy   The patient had pegfilgrastim (NEULASTA ONPRO KIT) injection 6 mg, 6 mg, Subcutaneous, Once, 30 of 31 cycles Administration: 6 mg (08/28/2017), 6 mg (09/18/2017), 6 mg (10/09/2017), 6 mg (11/02/2017), 6 mg (11/20/2017), 6 mg (12/18/2017), 6 mg (01/15/2018), 6 mg  (02/12/2018), 6 mg (03/12/2018), 6 mg (04/09/2018), 6 mg (05/07/2018), 6 mg (06/04/2018), 6 mg (07/02/2018), 6 mg (07/30/2018), 6 mg (08/27/2018), 6 mg (09/24/2018), 6 mg (10/29/2018), 6 mg (11/26/2018), 6 mg (12/24/2018), 6 mg (01/21/2019), 6 mg (02/18/2019), 6 mg (03/18/2019), 6 mg (04/15/2019), 6 mg (05/13/2019), 6 mg (06/17/2019), 6 mg (07/22/2019), 6 mg (08/19/2019), 6 mg (09/16/2019), 6 mg (10/21/2019), 6 mg (11/18/2019) topotecan (HYCAMTIN) 2.9 mg in sodium chloride 0.9 % 100 mL chemo infusion, 1.5 mg/m2 = 2.9 mg, Intravenous,  Once, 30 of 31 cycles Dose modification: 1.2 mg/m2 (80 % of original dose 1.5 mg/m2, Cycle 4, Reason: Dose Not Tolerated) Administration: 2.9 mg (08/24/2017), 2.9 mg (08/25/2017), 2.9 mg (08/28/2017), 2.9 mg (08/26/2017), 2.9 mg (08/27/2017), 2.9 mg (09/14/2017), 2.9 mg (09/15/2017), 2.9 mg (09/16/2017), 2.9 mg (09/17/2017), 2.9 mg (09/18/2017), 2.9 mg (10/05/2017), 2.9 mg (10/06/2017), 2.9 mg (10/07/2017), 2.9 mg (10/08/2017), 2.9 mg (10/09/2017), 2.3 mg (10/26/2017), 2.3 mg (10/27/2017), 2.3 mg (10/28/2017), 2.3 mg (10/29/2017), 2.3 mg (11/02/2017), 2.3 mg (11/16/2017), 2.3 mg (11/17/2017), 2.3 mg (11/18/2017), 2.3 mg (11/19/2017), 2.3 mg (11/20/2017), 2.3 mg (12/14/2017), 2.3 mg (12/15/2017), 2.3 mg (12/16/2017), 2.3 mg (12/17/2017), 2.3 mg (12/18/2017), 2.3 mg (01/11/2018), 2.3 mg (01/12/2018), 2.3 mg (01/13/2018), 2.3 mg (01/15/2018), 2.3 mg (02/08/2018), 2.3 mg (02/09/2018), 2.3 mg (02/10/2018), 2.3 mg (02/11/2018), 2.3 mg (02/12/2018), 2.3 mg (03/08/2018), 2.3 mg (03/09/2018), 2.3 mg (03/10/2018), 2.3 mg (03/11/2018), 2.3 mg (03/12/2018), 2.2 mg (04/05/2018), 2.2 mg (04/06/2018), 2.2 mg (04/07/2018), 2.2 mg (04/08/2018), 2.2 mg (04/09/2018), 2.2 mg (05/03/2018), 2.2 mg (05/04/2018), 2.2 mg (05/05/2018), 2.2 mg (05/06/2018), 2.2 mg (05/07/2018), 2.2 mg (05/31/2018), 2.2 mg (06/01/2018), 2.2 mg (06/02/2018), 2.2 mg (06/03/2018), 2.2 mg (06/04/2018), 2.2 mg (06/28/2018), 2.2 mg (06/29/2018), 2.2 mg (06/30/2018), 2.2 mg (07/01/2018), 2.2 mg (07/02/2018), 2.2 mg (07/26/2018),  2.2 mg (07/27/2018), 2.2 mg (07/28/2018), 2.2 mg (07/29/2018), 2.2 mg (07/30/2018), 2.2 mg (08/23/2018), 2.2 mg (08/24/2018), 2.2 mg (08/25/2018), 2.2 mg (08/26/2018), 2.2 mg (08/27/2018), 2.2 mg (09/20/2018), 2.2 mg (09/21/2018), 2.2 mg (09/22/2018), 2.2  mg (09/23/2018), 2.2 mg (09/24/2018), 2.2 mg (10/25/2018), 2.2 mg (10/26/2018), 2.2 mg (10/27/2018), 2.2 mg (10/28/2018), 2.2 mg (10/29/2018), 2.2 mg (11/22/2018), 2.2 mg (11/23/2018), 2.2 mg (11/24/2018), 2.2 mg (11/25/2018), 2.2 mg (11/26/2018), 2.2 mg (12/20/2018), 2.2 mg (12/21/2018), 2.2 mg (12/22/2018), 2.2 mg (12/23/2018), 2.2 mg (12/24/2018), 2.2 mg (01/17/2019), 2.2 mg (01/18/2019), 2.2 mg (01/19/2019), 2.2 mg (01/20/2019), 2.2 mg (01/21/2019), 2.2 mg (02/14/2019), 2.2 mg (02/15/2019), 2.2 mg (02/16/2019), 2.2 mg (02/17/2019), 2.2 mg (02/18/2019), 2.2 mg (03/14/2019), 2.2 mg (03/15/2019), 2.2 mg (03/16/2019), 2.2 mg (03/17/2019), 2.2 mg (03/18/2019), 2.2 mg (04/11/2019), 2.2 mg (04/12/2019), 2.2 mg (04/13/2019), 2.2 mg (04/14/2019), 2.2 mg (04/15/2019), 2.2 mg (05/09/2019), 2.2 mg (05/10/2019), 2.2 mg (05/11/2019), 2.2 mg (05/12/2019), 2.2 mg (05/13/2019), 2.2 mg (06/13/2019), 2.2 mg (06/14/2019), 2.2 mg (06/15/2019), 2.2 mg (06/16/2019), 2.2 mg (06/17/2019), 2.2 mg (07/18/2019), 2.2 mg (07/19/2019), 2.2 mg (07/20/2019), 2.2 mg (07/21/2019), 2.2 mg (07/22/2019), 2.2 mg (08/15/2019), 2.2 mg (08/16/2019), 2.2 mg (08/17/2019), 2.2 mg (08/18/2019), 2.2 mg (08/19/2019), 2.2 mg (09/12/2019), 2.2 mg (09/13/2019), 2.2 mg (09/14/2019), 2.2 mg (09/15/2019), 2.2 mg (09/16/2019), 2.2 mg (10/17/2019), 2.2 mg (10/18/2019), 2.2 mg (10/19/2019), 2.2 mg (10/20/2019), 2.2 mg (10/21/2019), 2.2 mg (11/14/2019), 2.2 mg (11/15/2019), 2.2 mg (11/16/2019), 2.2 mg (11/17/2019), 2.2 mg (11/18/2019) ondansetron (ZOFRAN) 4 mg in sodium chloride 0.9 % 50 mL IVPB, , Intravenous,  Once, 25 of 26 cycles Administration:  (02/08/2018),  (02/09/2018),  (02/10/2018),  (02/11/2018),  (02/12/2018),  (03/08/2018),  (03/09/2018),  (03/10/2018),  (03/11/2018),  (03/12/2018),  (04/05/2018),  (04/06/2018),  (04/07/2018),   (04/08/2018),  (04/09/2018),  (05/03/2018),  (05/04/2018),  (05/05/2018),  (05/06/2018),  (05/07/2018),  (05/31/2018),  (06/01/2018),  (06/02/2018),  (06/03/2018),  (06/04/2018),  (06/28/2018),  (06/29/2018),  (06/30/2018),  (07/01/2018),  (07/02/2018),  (07/26/2018),  (07/27/2018),  (07/28/2018),  (07/29/2018),  (07/30/2018),  (08/23/2018),  (08/24/2018),  (08/25/2018),  (08/26/2018),  (08/27/2018),  (09/20/2018),  (09/21/2018),  (09/22/2018),  (09/23/2018),  (09/24/2018),  (10/25/2018),  (10/26/2018),  (10/27/2018),  (10/28/2018),  (10/29/2018),  (11/22/2018),  (11/23/2018),  (11/24/2018),  (11/25/2018),  (11/26/2018),  (12/20/2018),  (12/21/2018),  (12/22/2018),  (12/23/2018),  (12/24/2018),  (01/17/2019),  (01/18/2019),  (01/19/2019),  (01/20/2019),  (01/21/2019),  (02/14/2019),  (02/15/2019),  (02/16/2019),  (02/17/2019),  (02/18/2019),  (03/14/2019),  (03/15/2019),  (03/16/2019),  (03/17/2019),  (03/18/2019),  (04/11/2019),  (04/12/2019),  (04/13/2019),  (04/14/2019),  (04/15/2019),  (05/09/2019),  (05/10/2019),  (05/11/2019),  (05/12/2019),  (05/13/2019),  (06/13/2019),  (06/14/2019),  (06/15/2019),  (06/16/2019),  (06/17/2019),  (07/18/2019),  (07/19/2019),  (07/20/2019),  (07/21/2019),  (07/22/2019),  (08/15/2019),  (08/16/2019),  (08/17/2019),  (08/18/2019),  (08/19/2019),  (09/12/2019),  (09/13/2019),  (09/14/2019),  (09/15/2019),  (09/16/2019),  (10/17/2019),  (10/18/2019),  (10/19/2019),  (10/20/2019),  (10/21/2019),  (11/14/2019),  (11/15/2019),  (11/16/2019),  (11/17/2019),  (11/18/2019)  for chemotherapy treatment.    12/22/2019 -  Chemotherapy   The patient had palonosetron (ALOXI) injection 0.25 mg, 0.25 mg, Intravenous,  Once, 4 of 8 cycles Administration: 0.25 mg (12/22/2019), 0.25 mg (01/13/2020), 0.25 mg (02/06/2020), 0.25 mg (02/27/2020) lurbinectedin (ZEPZELCA) 5.8 mg in sodium chloride 0.9 % 250 mL chemo infusion, 3.2 mg/m2 = 5.8 mg, Intravenous,  Once, 4 of 8 cycles Administration: 5.8 mg (12/22/2019), 5.8 mg (01/13/2020), 5.8 mg (02/06/2020), 5.8 mg (02/27/2020)  for chemotherapy treatment.      CANCER  STAGING: Cancer Staging No matching staging information was found for the patient.  INTERVAL HISTORY:  [name], a 69 y.o. male, returns for routine follow-up and consideration for next cycle of chemotherapy. [name] was last seen on 02/27/2020.  Due for cycle #5 of lurbinectedin today.   Today he reports feeling more tired with this chemo treatment. 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 He denies feeling light-headed when he stands up. He continues taking gabapentin and Percocet 1.5 tablets BID. His appetite is decreased more than before and he is forcing himself to eat.   Overall, he feels ready for next cycle of chemo today.    REVIEW OF SYSTEMS:  Review of Systems  Constitutional: Positive for appetite change (severely decreased) and fatigue (severe).  Respiratory: Positive for cough (dry) and shortness of breath (w/ exertion).   Gastrointestinal: Positive for blood in stool (d/t hemorrhoids).  Neurological: Positive for numbness (feet). Negative for light-headedness.  All other systems reviewed and are negative.   PAST MEDICAL/SURGICAL HISTORY:  Past Medical History:  Diagnosis Date  . Anxiety   . CAD (coronary artery disease)   . Cancer ([place])    stage 4 small cell lung cancer   . COPD (chronic obstructive pulmonary disease) ([place])   . Depression   . Dyspnea    increased exertion  . Feeling of chest tightness   . Heart palpitations   . History of chemotherapy   . Myocardial infarction ([place])   . Osteopenia   . Panic attacks   . Smoker    Past Surgical History:  Procedure Laterality Date  . BACK SURGERY  12/24/2000   L5,S1  . CORONARY STENT PLACEMENT  2005   RCA & CX  . HERNIA REPAIR Right 1980's  . INGUINAL HERNIA REPAIR  12/1978   right side  . IR FLUORO GUIDE PORT INSERTION RIGHT  04/02/2017  . IR US GUIDE BX ASP/DRAIN  02/03/2017  . IR US GUIDE  VASC ACCESS RIGHT  04/02/2017  . NM MYOCAR PERF WALL MOTION  09/07/2009   No ischemia; EF 51%  . SHOULDER SURGERY Left 08/2010  . SPINE SURGERY  2002   L5-S1    SOCIAL HISTORY:  Social History   Socioeconomic History  . Marital status: Single    Spouse name: Not on file  . Number of children: Not on file  . Years of education: Not on file  . Highest education level: Not on file  Occupational History  . Not on file  Tobacco Use  . Smoking status: Current Every Day Smoker    Packs/day: 1.00    Types: Cigarettes  . Smokeless tobacco: Never Used  Vaping Use  . Vaping Use: Never used  Substance and Sexual Activity  . Alcohol use: Yes    Comment: occas  . Drug use: No  . Sexual activity: Not on file  Other Topics Concern  . Not on file  Social History Narrative  . Not on file   Social Determinants of Health   Financial Resource Strain:   . Difficulty of Paying Living Expenses: Not on file  Food Insecurity:   . Worried About Charity fundraiser in the Last Year: Not on file  . Ran Out of Food in the Last Year: Not on file  Transportation Needs:   . Lack of Transportation (Medical): Not on file  . Lack of Transportation (Non-Medical): Not on file  Physical Activity:   . Days of Exercise per Week: Not on file  . Minutes of Exercise per Session: Not on file  Stress:   . Feeling of Stress :  Not on file  Social Connections:   . Frequency of Communication with Friends and Family: Not on file  . Frequency of Social Gatherings with Friends and Family: Not on file  . Attends Religious Services: Not on file  . Active Member of Clubs or Organizations: Not on file  . Attends Archivist Meetings: Not on file  . Marital Status: Not on file  Intimate Partner Violence:   . Fear of Current or Ex-Partner: Not on file  . Emotionally Abused: Not on file  . Physically Abused: Not on file  . Sexually Abused: Not on file    FAMILY HISTORY:  Family History  Problem Relation  Age of Onset  . Heart attack Father   . Kidney disease Father        renal failure  . Heart failure Mother   . Heart attack Mother   . Cancer Brother   . Diabetes Brother   . Alcohol abuse Brother   . Diabetes Sister     CURRENT MEDICATIONS:  Current Outpatient Medications  Medication Sig Dispense Refill  . albuterol (VENTOLIN HFA) 108 (90 Base) MCG/ACT inhaler Inhale 2 puffs into the lungs every 6 (six) hours as needed for wheezing or shortness of breath.     . ALPRAZolam (XANAX) 1 MG tablet Take 1 tablet (1 mg total) by mouth 4 (four) times daily as needed for anxiety. 120 tablet 0  . Ascorbic Acid (VITAMIN C) 1000 MG tablet Take 1,000 mg by mouth daily.    Marland Kitchen aspirin EC 81 MG tablet Take 81 mg by mouth daily.    Marland Kitchen atorvastatin (LIPITOR) 40 MG tablet TAKE (1) TABLET BY MOUTH ONCE DAILY. 90 tablet 1  . calcium-vitamin D (OSCAL WITH D) 250-125 MG-UNIT tablet Take 1 tablet by mouth daily.    . cholecalciferol (VITAMIN D3) 25 MCG (1000 UT) tablet Take 1,000 Units by mouth daily.    . clotrimazole-betamethasone (LOTRISONE) cream Apply 1 application topically 2 (two) times daily. 30 g 0  . docusate sodium (COLACE) 100 MG capsule Take two capsules at bedtime starting the day you receive chemotherapy and then for four days after 30 capsule 3  . gabapentin (NEURONTIN) 300 MG capsule Take 2 capsules (600 mg total) by mouth 3 (three) times daily. 180 capsule 3  . hydrOXYzine (ATARAX/VISTARIL) 50 MG tablet TAKE ONE TABLET BY MOUTH AT BEDTIME AS NEEDED FOR SLEEP. 30 tablet 0  . Multiple Vitamins-Minerals (CENTRUM SILVER 50+MEN) TABS Take 1 tablet by mouth daily.    . ondansetron (ZOFRAN) 8 MG tablet Take 1 tablet (8 mg total) by mouth 2 (two) times daily as needed. Start on the third day after cisplatin chemotherapy. (Patient not taking: Reported on 02/06/2020) 30 tablet 1  . oxyCODONE-acetaminophen (PERCOCET/ROXICET) 5-325 MG tablet TAKE 1 TABLET EVERY 8 HOURS AS NEEDED FOR SEVERE PAIN. 90 tablet 0    . Pegfilgrastim (NEULASTA ONPRO [place]) Inject into the skin. Every 21 days    . prochlorperazine (COMPAZINE) 10 MG tablet Take 1 tablet (10 mg total) by mouth every 6 (six) hours as needed (Nausea or vomiting). (Patient not taking: Reported on 02/06/2020) 60 tablet 1  . senna (SENOKOT) 8.6 MG tablet Take 3 tablets by mouth daily.     . topotecan in sodium chloride 0.9 % 100 mL Inject into the vein once. Days 1-5 every 21 days    . vitamin B-12 (CYANOCOBALAMIN) 100 MCG tablet Take 100 mcg by mouth daily.     No current facility-administered medications  for this visit.    ALLERGIES:  Allergies  Allergen Reactions  . Codeine Nausea Only  . Niaspan [Niacin Er]     PHYSICAL EXAM:  Performance status (ECOG): 1 - Symptomatic but completely ambulatory  Vitals:   03/20/20 1317  BP: 140/65  Pulse: (!) 58  Resp: 17  Temp: (!) 96.9 F (36.1 C)  SpO2: 98%   Wt Readings from Last 3 Encounters:  03/20/20 147 lb 12.8 oz (67 kg)  03/01/20 146 lb (66.2 kg)  02/27/20 146 lb 6.4 oz (66.4 kg)   Physical Exam Vitals reviewed.  Constitutional:      Appearance: Normal appearance.  Cardiovascular:     Rate and Rhythm: Normal rate and regular rhythm.     Pulses: Normal pulses.     Heart sounds: Normal heart sounds.  Pulmonary:     Effort: Pulmonary effort is normal.     Breath sounds: Normal breath sounds.  Chest:     Comments: Port-a-Cath in R chest Neurological:     General: No focal deficit present.     Mental Status: He is alert and oriented to person, place, and time.  Psychiatric:        Mood and Affect: Mood normal.        Behavior: Behavior normal.     LABORATORY DATA:  I have reviewed the labs as listed.  CBC Latest Ref Rng & Units 03/20/2020 02/27/2020 02/06/2020  WBC 4.0 - 10.5 K/uL 4.9 3.6(L) 6.3  Hemoglobin 13.0 - 17.0 g/dL 9.9(L) 9.9(L) 10.5(L)  Hematocrit 39 - 52 % 30.4(L) 30.8(L) 32.9(L)  Platelets 150 - 400 K/uL 267 268 263   CMP Latest Ref Rng & Units 03/20/2020  02/27/2020 02/06/2020  Glucose 70 - 99 mg/dL 93 101(H) 103(H)  BUN 8 - 23 mg/dL _0 Creatinine 0.61 - 1.24 mg/dL 0.86 0.98 0.97  Sodium 135 - 145 mmol/L 135 136 136  Potassium 3.5 - 5.1 mmol/L 4.6 4.1 4.3  Chloride 98 - 111 mmol/L 104 106 103  CO2 22 - 32 mmol/L _1 Calcium 8.9 - 10.3 mg/dL 8.8(L) 8.7(L) 9.0  Total Protein 6.5 - 8.1 g/dL 6.5 6.6 6.9  Total Bilirubin 0.3 - 1.2 mg/dL 0.8 0.5 0.6  Alkaline Phos 38 - 126 U/L 48 48 53  AST 15 - 41 U/L _2 ALT 0 - 44 U/L _3 Lab Results  Component Value Date   LDH 135 03/20/2020   LDH 134 02/06/2020   LDH 135 01/13/2020   Lab Results  Component Value Date   TIBC 274 02/27/2020   TIBC 284 12/19/2019   TIBC 321 09/12/2019   FERRITIN 235 02/27/2020   FERRITIN 191 12/19/2019   FERRITIN 103 09/12/2019   IRONPCTSAT 19 02/27/2020   IRONPCTSAT 11 (L) 12/19/2019   IRONPCTSAT 5 (L) 09/12/2019    DIAGNOSTIC IMAGING:  I have independently reviewed the scans and discussed with the patient. CT SOFT TISSUE NECK W CONTRAST  Result Date: 02/23/2020 CLINICAL DATA:  Metastatic small cell lung cancer. Positive right neck lymph node biopsy 02/03/2017. EXAM: CT NECK WITH CONTRAST TECHNIQUE: Multidetector CT imaging of the neck was performed using the standard protocol following the bolus administration of intravenous contrast. CONTRAST:  135m OMNIPAQUE IOHEXOL 300 MG/ML  SOLN COMPARISON:  12/15/2019 FINDINGS: Pharynx and larynx: There is a necrotic or 2 adjacent necrotic retropharyngeal lymph nodes on the left at the level of the nasopharynx measuring in total 20 x 14  mm. This is much more likely to represent necrotic adenopathy rather than a primary nasopharyngeal mass. No other mucosal or submucosal lesion is evident. Salivary glands: Parotid and submandibular glands are normal. Thyroid: Diminutive thyroid tissue.  No mass. Lymph nodes: Marked reduction of lower right neck and right supraclavicular lymphadenopathy. Index node  measured previously at 3 cm in size is essentially completely resolved, now visible as only a small area of skin thickening just posterior to the right jugular vein. Similarly, supraclavicular region lymph nodes appear to have resolved. No other new or enlarging node is demonstrated. Vascular: No significant vascular finding. Ordinary atherosclerotic calcification at the carotid bifurcations. Limited intracranial: Normal Visualized orbits: Normal Mastoids and visualized paranasal sinuses: Clear Skeleton: Normal Upper chest: Scarring and emphysema at the lung apices. Aortic atherosclerosis. Other: None IMPRESSION: Marked reduction of lower right neck and right supraclavicular lymphadenopathy. Index node measured previously at 3 cm in size is essentially completely resolved, now visible as only a small area of skin thickening just posterior to the right jugular vein. Similarly, supraclavicular region lymph nodes appear to have resolved. There is a necrotic node or 2 adjacent necrotic retropharyngeal lymph nodes on the left at the level of the nasopharynx (nodes of [place]). This is much more likely to represent necrotic adenopathy rather than a primary nasopharyngeal mass. Aortic Atherosclerosis (ICD10-I70.0) and Emphysema (ICD10-J43.9). Electronically Signed   By: Nelson Chimes M.D.   On: 02/23/2020 12:45   CT Chest W Contrast  Result Date: 02/23/2020 CLINICAL DATA:  Extensive stage small cell lung cancer diagnosed July 2018. Restaging. EXAM: CT CHEST, ABDOMEN, AND PELVIS WITH CONTRAST TECHNIQUE: Multidetector CT imaging of the chest, abdomen and pelvis was performed following the standard protocol during bolus administration of intravenous contrast. CONTRAST:  140m OMNIPAQUE IOHEXOL 300 MG/ML  SOLN COMPARISON:  12/15/2019 CT chest, abdomen and pelvis. FINDINGS: CT CHEST FINDINGS Cardiovascular: Normal heart size. No significant pericardial effusion/thickening. Three-vessel coronary atherosclerosis. Right  internal jugular Port-A-Cath terminates at the cavoatrial junction. Atherosclerotic nonaneurysmal thoracic aorta. Normal caliber pulmonary arteries. No central pulmonary emboli. Mediastinum/Nodes: No discrete thyroid nodules. Unremarkable esophagus. No axillary adenopathy. Previously described high right paratracheal and left prevascular lymphadenopathy has resolved. No new or residual pathologically enlarged mediastinal or hilar lymph nodes. Lungs/Pleura: No pneumothorax. No pleural effusion. Severe centrilobular and paraseptal emphysema with diffuse bronchial wall thickening. No acute consolidative airspace disease or lung masses. Scattered bilateral irregular pulmonary nodules are all stable, including 0.3 cm medial right upper lobe nodule (series 4/image 42), peribronchovascular 1.6 cm right lower lobe nodule (series 4/image 120) and clustered left lower lobe nodules up to 1.0 cm (series 4/image 115). No new significant pulmonary nodules. Musculoskeletal: No aggressive appearing focal osseous lesions. Mild thoracic spondylosis. CT ABDOMEN PELVIS FINDINGS Hepatobiliary: Normal liver size. Subcentimeter hypodense inferior left liver lesion is too small to characterize and is unchanged, presumably benign. No new liver lesions. Contracted gallbladder with no radiopaque cholelithiasis. No biliary ductal dilatation. Pancreas: Normal, with no mass or duct dilation. Spleen: Normal size. No mass. Adrenals/Urinary Tract: Normal adrenals. Simple 2.5 cm medial upper left renal cyst. Subcentimeter hypodense renal cortical lesions in both kidneys are too small to characterize and not appreciably changed. No hydronephrosis. Normal bladder. Stomach/Bowel: Normal non-distended stomach. Normal caliber small bowel with no small bowel wall thickening. Normal appendix. Oral contrast transits to the colon. Mild sigmoid diverticulosis, with no large bowel wall thickening or significant pericolonic fat stranding. Vascular/Lymphatic:  Atherosclerotic nonaneurysmal abdominal aorta. Patent portal, splenic, hepatic and renal veins.  No pathologically enlarged lymph nodes in the abdomen or pelvis. Reproductive: Normal size prostate. Other: No pneumoperitoneum, ascites or focal fluid collection. Musculoskeletal: No aggressive appearing focal osseous lesions. Moderate lower lumbar spondylosis. IMPRESSION: 1. Interval treatment response with resolved mediastinal adenopathy. No evidence of new or progressive metastatic disease in the chest, abdomen or pelvis. Scattered bilateral irregular pulmonary nodules are all stable. 2. Aortic Atherosclerosis (ICD10-I70.0) and Emphysema (ICD10-J43.9). Electronically Signed   By: Ilona Sorrel M.D.   On: 02/23/2020 13:21   CT Abdomen Pelvis W Contrast  Result Date: 02/23/2020 CLINICAL DATA:  Extensive stage small cell lung cancer diagnosed July 2018. Restaging. EXAM: CT CHEST, ABDOMEN, AND PELVIS WITH CONTRAST TECHNIQUE: Multidetector CT imaging of the chest, abdomen and pelvis was performed following the standard protocol during bolus administration of intravenous contrast. CONTRAST:  110m OMNIPAQUE IOHEXOL 300 MG/ML  SOLN COMPARISON:  12/15/2019 CT chest, abdomen and pelvis. FINDINGS: CT CHEST FINDINGS Cardiovascular: Normal heart size. No significant pericardial effusion/thickening. Three-vessel coronary atherosclerosis. Right internal jugular Port-A-Cath terminates at the cavoatrial junction. Atherosclerotic nonaneurysmal thoracic aorta. Normal caliber pulmonary arteries. No central pulmonary emboli. Mediastinum/Nodes: No discrete thyroid nodules. Unremarkable esophagus. No axillary adenopathy. Previously described high right paratracheal and left prevascular lymphadenopathy has resolved. No new or residual pathologically enlarged mediastinal or hilar lymph nodes. Lungs/Pleura: No pneumothorax. No pleural effusion. Severe centrilobular and paraseptal emphysema with diffuse bronchial wall thickening. No acute  consolidative airspace disease or lung masses. Scattered bilateral irregular pulmonary nodules are all stable, including 0.3 cm medial right upper lobe nodule (series 4/image 42), peribronchovascular 1.6 cm right lower lobe nodule (series 4/image 120) and clustered left lower lobe nodules up to 1.0 cm (series 4/image 115). No new significant pulmonary nodules. Musculoskeletal: No aggressive appearing focal osseous lesions. Mild thoracic spondylosis. CT ABDOMEN PELVIS FINDINGS Hepatobiliary: Normal liver size. Subcentimeter hypodense inferior left liver lesion is too small to characterize and is unchanged, presumably benign. No new liver lesions. Contracted gallbladder with no radiopaque cholelithiasis. No biliary ductal dilatation. Pancreas: Normal, with no mass or duct dilation. Spleen: Normal size. No mass. Adrenals/Urinary Tract: Normal adrenals. Simple 2.5 cm medial upper left renal cyst. Subcentimeter hypodense renal cortical lesions in both kidneys are too small to characterize and not appreciably changed. No hydronephrosis. Normal bladder. Stomach/Bowel: Normal non-distended stomach. Normal caliber small bowel with no small bowel wall thickening. Normal appendix. Oral contrast transits to the colon. Mild sigmoid diverticulosis, with no large bowel wall thickening or significant pericolonic fat stranding. Vascular/Lymphatic: Atherosclerotic nonaneurysmal abdominal aorta. Patent portal, splenic, hepatic and renal veins. No pathologically enlarged lymph nodes in the abdomen or pelvis. Reproductive: Normal size prostate. Other: No pneumoperitoneum, ascites or focal fluid collection. Musculoskeletal: No aggressive appearing focal osseous lesions. Moderate lower lumbar spondylosis. IMPRESSION: 1. Interval treatment response with resolved mediastinal adenopathy. No evidence of new or progressive metastatic disease in the chest, abdomen or pelvis. Scattered bilateral irregular pulmonary nodules are all stable. 2.  Aortic Atherosclerosis (ICD10-I70.0) and Emphysema (ICD10-J43.9). Electronically Signed   By: JIlona SorrelM.D.   On: 02/23/2020 13:21     ASSESSMENT:  1. 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 -CT CAP on 12/15/2019 showed enlarging prevascular and right paratracheal lymph nodes. Stable left lower lobe lung nodules. -CT neck on 12/15/2019 showed malignant lymphadenopathy in the lower right posterior triangle and jugular chain. -Lurbinectidin 3.27mkgstarted on 12/22/2019. -CT neck on 02/23/2020 showed marked reduction of lower right neck and right supraclavicular adenopathy. -CT CAP  on 02/23/2020 showed treatment response with resolved mediastinal adenopathy.  No evidence of new or progressive metastatic disease.  Scattered bilateral pulmonary nodules.   PLAN:  1. 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  His knees have buckled and almost fell down on Saturday at the [place].  He was evaluated by EMS at that time. -Reviewed his labs today.  LFTs are normal.  CBC shows normal white count.  Hemoglobin is 9.9. -He is feeling better today and back to normal self.  Because of the severe weakness, I will decrease the dose of Lurbinectedin to 2.6 mg per metered square. -I will reevaluate him in 3 weeks.  2. Normocytic anemia: -Hemoglobin is 9.9.  Last ferritin was 235 and percent saturation was 19.  3. Chronic pain: -Continue Percocet 1 and half tablets twice daily.  4. Peripheral neuropathy: -Continue gabapentin 600 mg 3 times daily.  5.  Sleeping difficulty: -Continue Vistaril as needed.   Orders placed this encounter:  No orders of the defined types were placed in this encounter.    Derek Jack, MD [place] [phone]   I, Milinda Antis, am acting as a scribe for Dr. Sanda Linger.  I, Derek Jack MD, have  reviewed the above documentation for accuracy and completeness, and I agree with the above.

## 2020-03-20 NOTE — Patient Instructions (Signed)
[facility] Discharge Instructions for Patients Receiving Chemotherapy  Today you received the following chemotherapy agents   To help prevent nausea and vomiting after your treatment, we encourage you to take your nausea medication   If you develop nausea and vomiting that is not controlled by your nausea medication, call the clinic.   BELOW ARE SYMPTOMS THAT SHOULD BE REPORTED IMMEDIATELY:  *FEVER GREATER THAN 100.5 F  *CHILLS WITH OR WITHOUT FEVER  NAUSEA AND VOMITING THAT IS NOT CONTROLLED WITH YOUR NAUSEA MEDICATION  *UNUSUAL SHORTNESS OF BREATH  *UNUSUAL BRUISING OR BLEEDING  TENDERNESS IN MOUTH AND THROAT WITH OR WITHOUT PRESENCE OF ULCERS  *URINARY PROBLEMS  *BOWEL PROBLEMS  UNUSUAL RASH Items with * indicate a potential emergency and should be followed up as soon as possible.  Feel free to call the clinic should you have any questions or concerns. The clinic phone number is [phone].  Please show the CHEMO ALERT CARD at check-in to the Emergency Department and triage nurse.   

## 2020-03-20 NOTE — Progress Notes (Signed)
Patient has been assessed, vital signs and labs have been reviewed by Dr. Delton Coombes. ANC, Creatinine, LFTs, and Platelets are within treatment parameters per Dr. Delton Coombes. He will dose reduce treatment today per Dr. Delton Coombes.The patient is good to proceed with treatment at this time.

## 2020-03-20 NOTE — Patient Instructions (Signed)
[facility] Discharge Instructions  You were seen today by Dr. Katragadda. He went over your recent results. You received your treatment today. Dr. Katragadda will see you back in 3 weeks for labs and follow up.   Thank you for choosing [facility] at [facility] to provide your oncology and hematology care.  To afford each patient quality time with our provider, please arrive at least 15 minutes before your scheduled appointment time.   If you have a lab appointment with the Cancer Center please come in thru the Main Entrance and check in at the main information desk  You need to re-schedule your appointment should you arrive 10 or more minutes late.  We strive to give you quality time with our providers, and arriving late affects you and other patients whose appointments are after yours.  Also, if you no show three or more times for appointments you may be dismissed from the clinic at the providers discretion.     Again, thank you for choosing [facility].  Our hope is that these requests will decrease the amount of time that you wait before being seen by our physicians.       _____________________________________________________________  Should you have questions after your visit to [facility], please contact our office at [phone] between the hours of 8:00 a.m. and 4:30 p.m.  Voicemails left after 4:00 p.m. will not be returned until the following business day.  For prescription refill requests, have your pharmacy contact our office and allow 72 hours.    Cancer Center Support Programs:   > Cancer Support Group  2nd Tuesday of the month 1pm-2pm, Journey Room    

## 2020-03-20 NOTE — Progress Notes (Signed)
Patient presents today for treatment and follow up visit with Dr. Delton Coombes. Vital signs are stable. Patient has no complaints of pain today. Patient states he had an episode of dizziness with his legs buckling underneath him per patient's words.   Verbal orders received from [facility] L[facility]/ Dr. Claudina Lick to proceed with treatment today. MD to dose reduce per [facility] LPN/ Dr. Delton Coombes.   Treatment given today per MD orders. Tolerated infusion without adverse affects. Vital signs stable. No complaints at this time. Discharged from clinic ambulatory. F/U with [facility] as scheduled.

## 2020-03-29 ENCOUNTER — Other Ambulatory Visit: Payer: Self-pay | Admitting: Family Medicine

## 2020-03-29 DIAGNOSIS — F411 Generalized anxiety disorder: Secondary | ICD-10-CM

## 2020-03-29 NOTE — Telephone Encounter (Signed)
Ok to refill??  Last office visit/ refill 02/20/2020.  Ok to add refills to prescription?

## 2020-04-10 ENCOUNTER — Inpatient Hospital Stay (HOSPITAL_COMMUNITY): Payer: Medicare HMO

## 2020-04-10 ENCOUNTER — Inpatient Hospital Stay (HOSPITAL_BASED_OUTPATIENT_CLINIC_OR_DEPARTMENT_OTHER): Payer: Medicare HMO | Admitting: Hematology

## 2020-04-10 ENCOUNTER — Other Ambulatory Visit: Payer: Self-pay

## 2020-04-10 VITALS — BP 110/51 | HR 56 | Temp 96.8°F | Resp 18 | Wt 147.8 lb

## 2020-04-10 DIAGNOSIS — Z23 Encounter for immunization: Secondary | ICD-10-CM

## 2020-04-10 DIAGNOSIS — C3490 Malignant neoplasm of unspecified part of unspecified bronchus or lung: Secondary | ICD-10-CM

## 2020-04-10 DIAGNOSIS — D649 Anemia, unspecified: Secondary | ICD-10-CM | POA: Diagnosis not present

## 2020-04-10 DIAGNOSIS — Z5111 Encounter for antineoplastic chemotherapy: Secondary | ICD-10-CM | POA: Diagnosis not present

## 2020-04-10 DIAGNOSIS — C787 Secondary malignant neoplasm of liver and intrahepatic bile duct: Secondary | ICD-10-CM | POA: Diagnosis not present

## 2020-04-10 LAB — COMPREHENSIVE METABOLIC PANEL
ALT: 14 U/L (ref 0–44)
AST: 16 U/L (ref 15–41)
Albumin: 3.8 g/dL (ref 3.5–5.0)
Alkaline Phosphatase: 54 U/L (ref 38–126)
Anion gap: 7 (ref 5–15)
BUN: 22 mg/dL (ref 8–23)
CO2: 24 mmol/L (ref 22–32)
Calcium: 8.7 mg/dL — ABNORMAL LOW (ref 8.9–10.3)
Chloride: 106 mmol/L (ref 98–111)
Creatinine, Ser: 0.98 mg/dL (ref 0.61–1.24)
GFR calc Af Amer: >60 mL/min (ref >60)
GFR calc non Af Amer: >60 mL/min (ref >60)
Glucose, Bld: 101 mg/dL — ABNORMAL HIGH (ref 70–99)
Potassium: 4.1 mmol/L (ref 3.5–5.1)
Sodium: 137 mmol/L (ref 135–145)
Total Bilirubin: 0.5 mg/dL (ref 0.3–1.2)
Total Protein: 6.6 g/dL (ref 6.5–8.1)

## 2020-04-10 LAB — CBC WITH DIFFERENTIAL/PLATELET
Abs Immature Granulocytes: 0.02 10*3/uL (ref 0.00–0.07)
Basophils Absolute: 0 10*3/uL (ref 0.0–0.1)
Basophils Relative: 1 %
Eosinophils Absolute: 0.1 10*3/uL (ref 0.0–0.5)
Eosinophils Relative: 3 %
HCT: 31.8 % — ABNORMAL LOW (ref 39.0–52.0)
Hemoglobin: 10.2 g/dL — ABNORMAL LOW (ref 13.0–17.0)
Immature Granulocytes: 1 %
Lymphocytes Relative: 34 %
Lymphs Abs: 1.5 10*3/uL (ref 0.7–4.0)
MCH: 31.2 pg (ref 26.0–34.0)
MCHC: 32.1 g/dL (ref 30.0–36.0)
MCV: 97.2 fL (ref 80.0–100.0)
Monocytes Absolute: 0.7 10*3/uL (ref 0.1–1.0)
Monocytes Relative: 17 %
Neutro Abs: 1.9 10*3/uL (ref 1.7–7.7)
Neutrophils Relative %: 44 %
Platelets: 254 10*3/uL (ref 150–400)
RBC: 3.27 MIL/uL — ABNORMAL LOW (ref 4.22–5.81)
RDW: 22.5 % — ABNORMAL HIGH (ref 11.5–15.5)
WBC: 4.3 10*3/uL (ref 4.0–10.5)
nRBC: 0 % (ref 0.0–0.2)

## 2020-04-10 LAB — MAGNESIUM: Magnesium: 2.1 mg/dL (ref 1.7–2.4)

## 2020-04-10 MED ORDER — SODIUM CHLORIDE 0.9 % IV SOLN
10.0000 mg | Freq: Once | INTRAVENOUS | Status: AC
  Administered 2020-04-10: 10 mg via INTRAVENOUS
  Filled 2020-04-10: qty 10

## 2020-04-10 MED ORDER — PALONOSETRON HCL INJECTION 0.25 MG/5ML
0.2500 mg | Freq: Once | INTRAVENOUS | Status: AC
  Administered 2020-04-10: 0.25 mg via INTRAVENOUS
  Filled 2020-04-10: qty 5

## 2020-04-10 MED ORDER — INFLUENZA VAC A&B SA ADJ QUAD 0.5 ML IM PRSY
0.5000 mL | PREFILLED_SYRINGE | Freq: Once | INTRAMUSCULAR | Status: AC
  Administered 2020-04-10: 0.5 mL via INTRAMUSCULAR
  Filled 2020-04-10: qty 0.5

## 2020-04-10 MED ORDER — SODIUM CHLORIDE 0.9 % IV SOLN: Freq: Once | INTRAVENOUS | Status: AC

## 2020-04-10 MED ORDER — SODIUM CHLORIDE 0.9% FLUSH
10.0000 mL | INTRAVENOUS | Status: DC | PRN
  Administered 2020-04-10: 10 mL

## 2020-04-10 MED ORDER — SODIUM CHLORIDE 0.9 % IV SOLN
2.6000 mg/m2 | Freq: Once | INTRAVENOUS | Status: AC
  Administered 2020-04-10: 4.75 mg via INTRAVENOUS
  Filled 2020-04-10: qty 9.5

## 2020-04-10 MED ORDER — HEPARIN SOD (PORK) LOCK FLUSH 100 UNIT/ML IV SOLN
500.0000 [IU] | Freq: Once | INTRAVENOUS | Status: AC | PRN
  Administered 2020-04-10: 500 [IU]

## 2020-04-10 NOTE — Patient Instructions (Signed)
[facility] at [facility] Discharge Instructions  You were seen today by Dr. Katragadda. He went over your recent results. You received your treatment today. Dr. Katragadda will see you back in 3 weeks for labs and follow up.   Thank you for choosing [facility] at [facility] to provide your oncology and hematology care.  To afford each patient quality time with our provider, please arrive at least 15 minutes before your scheduled appointment time.   If you have a lab appointment with the Cancer Center please come in thru the Main Entrance and check in at the main information desk  You need to re-schedule your appointment should you arrive 10 or more minutes late.  We strive to give you quality time with our providers, and arriving late affects you and other patients whose appointments are after yours.  Also, if you no show three or more times for appointments you may be dismissed from the clinic at the providers discretion.     Again, thank you for choosing [facility].  Our hope is that these requests will decrease the amount of time that you wait before being seen by our physicians.       _____________________________________________________________  Should you have questions after your visit to [facility], please contact our office at [phone] between the hours of 8:00 a.m. and 4:30 p.m.  Voicemails left after 4:00 p.m. will not be returned until the following business day.  For prescription refill requests, have your pharmacy contact our office and allow 72 hours.    Cancer Center Support Programs:   > Cancer Support Group  2nd Tuesday of the month 1pm-2pm, Journey Room    

## 2020-04-10 NOTE — Patient Instructions (Signed)
[facility] Discharge Instructions for Patients Receiving Chemotherapy  Today you received the following chemotherapy agents   To help prevent nausea and vomiting after your treatment, we encourage you to take your nausea medication   If you develop nausea and vomiting that is not controlled by your nausea medication, call the clinic.   BELOW ARE SYMPTOMS THAT SHOULD BE REPORTED IMMEDIATELY:  *FEVER GREATER THAN 100.5 F  *CHILLS WITH OR WITHOUT FEVER  NAUSEA AND VOMITING THAT IS NOT CONTROLLED WITH YOUR NAUSEA MEDICATION  *UNUSUAL SHORTNESS OF BREATH  *UNUSUAL BRUISING OR BLEEDING  TENDERNESS IN MOUTH AND THROAT WITH OR WITHOUT PRESENCE OF ULCERS  *URINARY PROBLEMS  *BOWEL PROBLEMS  UNUSUAL RASH Items with * indicate a potential emergency and should be followed up as soon as possible.  Feel free to call the clinic should you have any questions or concerns. The clinic phone number is [phone].  Please show the CHEMO ALERT CARD at check-in to the Emergency Department and triage nurse.   

## 2020-04-10 NOTE — Progress Notes (Signed)
Patient was assessed by Dr. Katragadda and labs have been reviewed.  Patient is okay to proceed with treatment today. Primary RN and pharmacy aware.   

## 2020-04-10 NOTE — Progress Notes (Signed)
[address], [place] [address]   CLINIC:  Medical Oncology/Hematology  PCP:  [name], MD [address] 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 Bulky 5.4 cm right supraclavicular region malignant lymph node conglomeration with extracapsular extension. 2. Surrounding smaller abnormal right level 3 and level 5 lymph nodes, and the lymphadenopathy continues into the superior mediastinum, see Chest CT findings reported separately. 3. No other metastatic disease identified in the neck.   01/16/2017 Imaging   CT chest: IMPRESSION: 1. Extensive lymphadenopathy in the thorax and lower right cervical region, as discussed above. Primary differential considerations include lymphoma/leukemia or small cell carcinoma of the lung. Further evaluation a PET-CT could be considered to assess for additional sites of disease below the diaphragm if clinically appropriate. Additionally, ultrasound-guided biopsy of supraclavicular lymphadenopathy could be considered to establish a tissue diagnosis. 2. Indeterminate lesion in the periphery of segment 8 of the liver measuring 2.7 x 1.7 cm. Attention at time of follow-up PET-CT is recommended. 3. Aortic atherosclerosis, in addition to left main and 3 vessel coronary artery disease. Please note that although the presence of coronary artery calcium documents the presence of coronary artery disease, the severity of this disease and any potential stenosis cannot be assessed on this non-gated CT examination. Assessment for potential risk factor modification,  dietary therapy or pharmacologic therapy may be warranted, if clinically indicated. 4. There are calcifications of the aortic valve. Echocardiographic correlation for evaluation of potential valvular dysfunction may be warranted if clinically indicated. 5. Diffuse bronchial wall thickening with moderate centrilobular and paraseptal emphysema; imaging findings suggestive of underlying COPD.   02/03/2017 Initial Biopsy   (R) neck lymph node biopsy: SMALL CELL CARCINOMA (most likely lung primary).    02/03/2017 Miscellaneous   Port-a-cath attempted by IR; unable to place d/t enlarged SVC.    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 Staging for metastatic disease  EXAM: MRI HEAD WITHOUT AND WITH CONTRAST  TECHNIQUE: Multiplanar, multiecho pulse sequences of the brain and surrounding structures were obtained without and with intravenous contrast.  CONTRAST:  6m MULTIHANCE GADOBENATE DIMEGLUMINE 529 MG/ML IV SOLN  COMPARISON:  None.  FINDINGS: Brain: Negative for hydrocephalus. Cerebral volume normal for age. Small nonenhancing white matter hyperintensities consistent with mild chronic microvascular ischemia. No acute infarct. Negative for hemorrhage or mass or edema  Normal enhancement postcontrast infusion. No enhancing mass lesion. Leptomeningeal enhancement is normal.  Vascular: Normal arterial flow voids.  Normal venous enhancement  Skull and upper cervical spine: Negative  Sinuses/Orbits: Negative  Other: None  IMPRESSION: Negative for metastatic disease.  No acute abnormality.  Mild chronic white matter changes.   04/07/2017 Imaging    PET:  1. Marked reduction in size and metabolic activity of bulky RIGHT supraclavicular adenopathy  mediastinal lymphadenopathy. 2. Residual moderate activity remains within small RIGHT supraclavicular lymph node, RIGHT lower paratracheal lymph node and RIGHT  hilar lymph node. 3. Resolution of prevascular and internal mammary mediastinal metastatic hypermetabolic activity. 4. Resolution of metabolic activity associated with solitary RIGHT hepatic lobe liver metastasis. 5. No evidence of disease progression. 6. 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   05/27/2017 Imaging   MRI brain w/ and w/o contrast IMPRESSION: 1. No metastatic disease identified. 2. Increased nonspecific cerebral white matter signal changes since August. These are most commonly small vessel disease related. 3. New right maxillary sinusitis. Benign appearing retention cysts in the nasopharynx with trace mastoid effusions.   06/12/2017 Imaging   PET-CT IMPRESSION: 1. There are two new hypermetabolic nodules identified within both lower lobes measuring up to 3.1 cm. The appearance is nonspecific and may be inflammatory/infectious in etiology. Pulmonary metastatic disease cannot be excluded and short-term follow-up imaging in 3 months is advised to reassess these nodules. 2. Stable appearance of mild hypermetabolic activity associated with right paratracheal and right hilar lymph nodes. 3. Decrease in FDG uptake associated with index right supraclavicular lymph node. 4. 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 5. Aortic Atherosclerosis (ICD10-I70.0) and Emphysema (ICD10-J43.9).   08/24/2017 - 12/18/2019 Chemotherapy   The patient had pegfilgrastim (NEULASTA ONPRO KIT) injection 6 mg, 6 mg, Subcutaneous, Once, 30 of 31 cycles Administration: 6 mg (08/28/2017), 6 mg (09/18/2017), 6 mg (10/09/2017), 6 mg (11/02/2017), 6 mg  (11/20/2017), 6 mg (12/18/2017), 6 mg (01/15/2018), 6 mg (02/12/2018), 6 mg (03/12/2018), 6 mg (04/09/2018), 6 mg (05/07/2018), 6 mg (06/04/2018), 6 mg (07/02/2018), 6 mg (07/30/2018), 6 mg (08/27/2018), 6 mg (09/24/2018), 6 mg (10/29/2018), 6 mg (11/26/2018), 6 mg (12/24/2018), 6 mg (01/21/2019), 6 mg (02/18/2019), 6 mg (03/18/2019), 6 mg (04/15/2019), 6 mg (05/13/2019), 6 mg (06/17/2019), 6 mg (07/22/2019), 6 mg (08/19/2019), 6 mg (09/16/2019), 6 mg (10/21/2019), 6 mg (11/18/2019) topotecan (HYCAMTIN) 2.9 mg in sodium chloride 0.9 % 100 mL chemo infusion, 1.5 mg/m2 = 2.9 mg, Intravenous,  Once, 30 of 31 cycles Dose modification: 1.2 mg/m2 (80 % of original dose 1.5 mg/m2, Cycle 4, Reason: Dose Not Tolerated) Administration: 2.9 mg (08/24/2017), 2.9 mg (08/25/2017), 2.9 mg (08/28/2017), 2.9 mg (08/26/2017), 2.9 mg (08/27/2017), 2.9 mg (09/14/2017), 2.9 mg (09/15/2017), 2.9 mg (09/16/2017), 2.9 mg (09/17/2017), 2.9 mg (09/18/2017), 2.9 mg (10/05/2017), 2.9 mg (10/06/2017), 2.9 mg (10/07/2017), 2.9 mg (10/08/2017), 2.9 mg (10/09/2017), 2.3 mg (10/26/2017), 2.3 mg (10/27/2017), 2.3 mg (10/28/2017), 2.3 mg (10/29/2017), 2.3 mg (11/02/2017), 2.3 mg (11/16/2017), 2.3 mg (11/17/2017), 2.3 mg (11/18/2017), 2.3 mg (11/19/2017), 2.3 mg (11/20/2017), 2.3 mg (12/14/2017), 2.3 mg (12/15/2017), 2.3 mg (12/16/2017), 2.3 mg (12/17/2017), 2.3 mg (12/18/2017), 2.3 mg (01/11/2018), 2.3 mg (01/12/2018), 2.3 mg (01/13/2018), 2.3 mg (01/15/2018), 2.3 mg (02/08/2018), 2.3 mg (02/09/2018), 2.3 mg (02/10/2018), 2.3 mg (02/11/2018), 2.3 mg (02/12/2018), 2.3 mg (03/08/2018), 2.3 mg (03/09/2018), 2.3 mg (03/10/2018), 2.3 mg (03/11/2018), 2.3 mg (03/12/2018), 2.2 mg (04/05/2018), 2.2 mg (04/06/2018), 2.2 mg (04/07/2018), 2.2 mg (04/08/2018), 2.2 mg (04/09/2018), 2.2 mg (05/03/2018), 2.2 mg (05/04/2018), 2.2 mg (05/05/2018), 2.2 mg (05/06/2018), 2.2 mg (05/07/2018), 2.2 mg (05/31/2018), 2.2 mg (06/01/2018), 2.2 mg (06/02/2018), 2.2 mg (06/03/2018), 2.2 mg (06/04/2018), 2.2 mg (06/28/2018), 2.2 mg (06/29/2018), 2.2 mg (06/30/2018), 2.2 mg  (07/01/2018), 2.2 mg (07/02/2018), 2.2 mg (07/26/2018), 2.2 mg (07/27/2018), 2.2 mg (07/28/2018), 2.2 mg (07/29/2018), 2.2 mg (07/30/2018), 2.2 mg (08/23/2018), 2.2 mg (08/24/2018), 2.2 mg (08/25/2018), 2.2 mg (08/26/2018), 2.2 mg (08/27/2018), 2.2  mg (09/20/2018), 2.2 mg (09/21/2018), 2.2 mg (09/22/2018), 2.2 mg (09/23/2018), 2.2 mg (09/24/2018), 2.2 mg (10/25/2018), 2.2 mg (10/26/2018), 2.2 mg (10/27/2018), 2.2 mg (10/28/2018), 2.2 mg (10/29/2018), 2.2 mg (11/22/2018), 2.2 mg (11/23/2018), 2.2 mg (11/24/2018), 2.2 mg (11/25/2018), 2.2 mg (11/26/2018), 2.2 mg (12/20/2018), 2.2 mg (12/21/2018), 2.2 mg (12/22/2018), 2.2 mg (12/23/2018), 2.2 mg (12/24/2018), 2.2 mg (01/17/2019), 2.2 mg (01/18/2019), 2.2 mg (01/19/2019), 2.2 mg (01/20/2019), 2.2 mg (01/21/2019), 2.2 mg (02/14/2019), 2.2 mg (02/15/2019), 2.2 mg (02/16/2019), 2.2 mg (02/17/2019), 2.2 mg (02/18/2019), 2.2 mg (03/14/2019), 2.2 mg (03/15/2019), 2.2 mg (03/16/2019), 2.2 mg (03/17/2019), 2.2 mg (03/18/2019), 2.2 mg (04/11/2019), 2.2 mg (04/12/2019), 2.2 mg (04/13/2019), 2.2 mg (04/14/2019), 2.2 mg (04/15/2019), 2.2 mg (05/09/2019), 2.2 mg (05/10/2019), 2.2 mg (05/11/2019), 2.2 mg (05/12/2019), 2.2 mg (05/13/2019), 2.2 mg (06/13/2019), 2.2 mg (06/14/2019), 2.2 mg (06/15/2019), 2.2 mg (06/16/2019), 2.2 mg (06/17/2019), 2.2 mg (07/18/2019), 2.2 mg (07/19/2019), 2.2 mg (07/20/2019), 2.2 mg (07/21/2019), 2.2 mg (07/22/2019), 2.2 mg (08/15/2019), 2.2 mg (08/16/2019), 2.2 mg (08/17/2019), 2.2 mg (08/18/2019), 2.2 mg (08/19/2019), 2.2 mg (09/12/2019), 2.2 mg (09/13/2019), 2.2 mg (09/14/2019), 2.2 mg (09/15/2019), 2.2 mg (09/16/2019), 2.2 mg (10/17/2019), 2.2 mg (10/18/2019), 2.2 mg (10/19/2019), 2.2 mg (10/20/2019), 2.2 mg (10/21/2019), 2.2 mg (11/14/2019), 2.2 mg (11/15/2019), 2.2 mg (11/16/2019), 2.2 mg (11/17/2019), 2.2 mg (11/18/2019) ondansetron (ZOFRAN) 4 mg in sodium chloride 0.9 % 50 mL IVPB, , Intravenous,  Once, 25 of 26 cycles Administration:  (02/08/2018),  (02/09/2018),  (02/10/2018),  (02/11/2018),  (02/12/2018),  (03/08/2018),  (03/09/2018),  (03/10/2018),  (03/11/2018),   (03/12/2018),  (04/05/2018),  (04/06/2018),  (04/07/2018),  (04/08/2018),  (04/09/2018),  (05/03/2018),  (05/04/2018),  (05/05/2018),  (05/06/2018),  (05/07/2018),  (05/31/2018),  (06/01/2018),  (06/02/2018),  (06/03/2018),  (06/04/2018),  (06/28/2018),  (06/29/2018),  (06/30/2018),  (07/01/2018),  (07/02/2018),  (07/26/2018),  (07/27/2018),  (07/28/2018),  (07/29/2018),  (07/30/2018),  (08/23/2018),  (08/24/2018),  (08/25/2018),  (08/26/2018),  (08/27/2018),  (09/20/2018),  (09/21/2018),  (09/22/2018),  (09/23/2018),  (09/24/2018),  (10/25/2018),  (10/26/2018),  (10/27/2018),  (10/28/2018),  (10/29/2018),  (11/22/2018),  (11/23/2018),  (11/24/2018),  (11/25/2018),  (11/26/2018),  (12/20/2018),  (12/21/2018),  (12/22/2018),  (12/23/2018),  (12/24/2018),  (01/17/2019),  (01/18/2019),  (01/19/2019),  (01/20/2019),  (01/21/2019),  (02/14/2019),  (02/15/2019),  (02/16/2019),  (02/17/2019),  (02/18/2019),  (03/14/2019),  (03/15/2019),  (03/16/2019),  (03/17/2019),  (03/18/2019),  (04/11/2019),  (04/12/2019),  (04/13/2019),  (04/14/2019),  (04/15/2019),  (05/09/2019),  (05/10/2019),  (05/11/2019),  (05/12/2019),  (05/13/2019),  (06/13/2019),  (06/14/2019),  (06/15/2019),  (06/16/2019),  (06/17/2019),  (07/18/2019),  (07/19/2019),  (07/20/2019),  (07/21/2019),  (07/22/2019),  (08/15/2019),  (08/16/2019),  (08/17/2019),  (08/18/2019),  (08/19/2019),  (09/12/2019),  (09/13/2019),  (09/14/2019),  (09/15/2019),  (09/16/2019),  (10/17/2019),  (10/18/2019),  (10/19/2019),  (10/20/2019),  (10/21/2019),  (11/14/2019),  (11/15/2019),  (11/16/2019),  (11/17/2019),  (11/18/2019)  for chemotherapy treatment.    12/22/2019 -  Chemotherapy   The patient had palonosetron (ALOXI) injection 0.25 mg, 0.25 mg, Intravenous,  Once, 5 of 8 cycles Administration: 0.25 mg (12/22/2019), 0.25 mg (01/13/2020), 0.25 mg (02/06/2020), 0.25 mg (02/27/2020), 0.25 mg (03/20/2020) lurbinectedin (ZEPZELCA) 5.8 mg in sodium chloride 0.9 % 250 mL chemo infusion, 3.2 mg/m2 = 5.8 mg, Intravenous,  Once, 5 of 8 cycles Dose modification: 2.6 mg/m2 (original dose 3.2 mg/m2, Cycle 5,  Reason: Other (see comments), Comment: weakness) Administration: 5.8 mg (12/22/2019), 5.8 mg (01/13/2020), 5.8 mg (02/06/2020), 5.8 mg (02/27/2020), 4.75 mg (03/20/2020)  for chemotherapy treatment.      CANCER STAGING: Cancer Staging No matching staging information was found for  the patient.  INTERVAL HISTORY:  [name], a 69 y.o. male, returns for routine follow-up and consideration for next cycle of chemotherapy. [name] was last seen on 03/20/2020.  Due for cycle #6 of lurbinectedin today.   Overall, he tells me he has been feeling pretty well. He denies having any falls in the last 3 weeks and he has been tolerating the treatment well. His fatigue is manageable; he continues doing light work on his car and nothing too difficult. He continues to take Percocet for his chronic pain. He denies having any leg swelling.  Overall, he feels ready for next cycle of chemo today.    REVIEW OF SYSTEMS:  Review of Systems  Constitutional: Positive for appetite change (depleted) and fatigue (50%).  Respiratory: Positive for wheezing (at night).   Cardiovascular: Negative for leg swelling.  Gastrointestinal: Positive for constipation (occasional).  Neurological: Positive for dizziness and numbness (feet).  All other systems reviewed and are negative.   PAST MEDICAL/SURGICAL HISTORY:  Past Medical History:  Diagnosis Date  . Anxiety   . CAD (coronary artery disease)   . Cancer ([place])    stage 4 small cell lung cancer   . COPD (chronic obstructive pulmonary disease) ([place])   . Depression   . Dyspnea    increased exertion  . Feeling of chest tightness   . Heart palpitations   . History of chemotherapy   . Myocardial infarction ([place])   . Osteopenia   . Panic attacks   . Smoker    Past Surgical History:  Procedure Laterality Date  . BACK SURGERY  12/24/2000   L5,S1  . CORONARY STENT PLACEMENT  2005   RCA & CX  . HERNIA REPAIR Right 1980's  . INGUINAL HERNIA REPAIR  12/1978    right side  . IR FLUORO GUIDE PORT INSERTION RIGHT  04/02/2017  . IR US GUIDE BX ASP/DRAIN  02/03/2017  . IR US GUIDE VASC ACCESS RIGHT  04/02/2017  . NM MYOCAR PERF WALL MOTION  09/07/2009   No ischemia; EF 51%  . SHOULDER SURGERY Left 08/2010  . SPINE SURGERY  2002   L5-S1    SOCIAL HISTORY:  Social History   Socioeconomic History  . Marital status: Single    Spouse name: Not on file  . Number of children: Not on file  . Years of education: Not on file  . Highest education level: Not on file  Occupational History  . Not on file  Tobacco Use  . Smoking status: Current Every Day Smoker    Packs/day: 1.00    Types: Cigarettes  . Smokeless tobacco: Never Used  Vaping Use  . Vaping Use: Never used  Substance and Sexual Activity  . Alcohol use: Yes    Comment: occas  . Drug use: No  . Sexual activity: Not on file  Other Topics Concern  . Not on file  Social History Narrative  . Not on file   Social Determinants of Health   Financial Resource Strain:   . Difficulty of Paying Living Expenses: Not on file  Food Insecurity:   . Worried About Charity fundraiser in the Last Year: Not on file  . Ran Out of Food in the Last Year: Not on file  Transportation Needs:   . Lack of Transportation (Medical): Not on file  . Lack of Transportation (Non-Medical): Not on file  Physical Activity:   . Days of Exercise per Week: Not on file  . Minutes  of Exercise per Session: Not on file  Stress:   . Feeling of Stress : Not on file  Social Connections:   . Frequency of Communication with Friends and Family: Not on file  . Frequency of Social Gatherings with Friends and Family: Not on file  . Attends Religious Services: Not on file  . Active Member of Clubs or Organizations: Not on file  . Attends Archivist Meetings: Not on file  . Marital Status: Not on file  Intimate Partner Violence:   . Fear of Current or Ex-Partner: Not on file  . Emotionally Abused: Not on file  .  Physically Abused: Not on file  . Sexually Abused: Not on file    FAMILY HISTORY:  Family History  Problem Relation Age of Onset  . Heart attack Father   . Kidney disease Father        renal failure  . Heart failure Mother   . Heart attack Mother   . Cancer Brother   . Diabetes Brother   . Alcohol abuse Brother   . Diabetes Sister     CURRENT MEDICATIONS:  Current Outpatient Medications  Medication Sig Dispense Refill  . albuterol (VENTOLIN HFA) 108 (90 Base) MCG/ACT inhaler Inhale 2 puffs into the lungs every 6 (six) hours as needed for wheezing or shortness of breath.     . ALPRAZolam (XANAX) 1 MG tablet TAKE (1) TABLET BY MOUTH (4) TIMES DAILY. 120 tablet 3  . Ascorbic Acid (VITAMIN C) 1000 MG tablet Take 1,000 mg by mouth daily.    Marland Kitchen aspirin EC 81 MG tablet Take 81 mg by mouth daily.    Marland Kitchen atorvastatin (LIPITOR) 40 MG tablet TAKE (1) TABLET BY MOUTH ONCE DAILY. 90 tablet 1  . calcium-vitamin D (OSCAL WITH D) 250-125 MG-UNIT tablet Take 1 tablet by mouth daily.    . cholecalciferol (VITAMIN D3) 25 MCG (1000 UT) tablet Take 1,000 Units by mouth daily.    . clotrimazole-betamethasone (LOTRISONE) cream Apply 1 application topically 2 (two) times daily. 30 g 0  . docusate sodium (COLACE) 100 MG capsule Take two capsules at bedtime starting the day you receive chemotherapy and then for four days after 30 capsule 3  . gabapentin (NEURONTIN) 300 MG capsule Take 2 capsules (600 mg total) by mouth 3 (three) times daily. 180 capsule 3  . hydrOXYzine (ATARAX/VISTARIL) 50 MG tablet TAKE ONE TABLET BY MOUTH AT BEDTIME AS NEEDED FOR SLEEP. 30 tablet 0  . Multiple Vitamins-Minerals (CENTRUM SILVER 50+MEN) TABS Take 1 tablet by mouth daily.    . ondansetron (ZOFRAN) 8 MG tablet Take 1 tablet (8 mg total) by mouth 2 (two) times daily as needed. Start on the third day after cisplatin chemotherapy. 30 tablet 1  . oxyCODONE-acetaminophen (PERCOCET/ROXICET) 5-325 MG tablet TAKE 1 TABLET EVERY 8 HOURS  AS NEEDED FOR SEVERE PAIN. 90 tablet 0  . Pegfilgrastim (NEULASTA ONPRO [place]) Inject into the skin. Every 21 days    . prochlorperazine (COMPAZINE) 10 MG tablet Take 1 tablet (10 mg total) by mouth every 6 (six) hours as needed (Nausea or vomiting). 60 tablet 1  . senna (SENOKOT) 8.6 MG tablet Take 3 tablets by mouth daily.     . topotecan in sodium chloride 0.9 % 100 mL Inject into the vein once. Days 1-5 every 21 days    . vitamin B-12 (CYANOCOBALAMIN) 100 MCG tablet Take 100 mcg by mouth daily.     No current facility-administered medications for this visit.  ALLERGIES:  Allergies  Allergen Reactions  . Codeine Nausea Only  . Niaspan [Niacin Er]     PHYSICAL EXAM:  Performance status (ECOG): 1 - Symptomatic but completely ambulatory  Vitals:   04/10/20 1000  BP: (!) 110/51  Pulse: (!) 56  Resp: 18  Temp: (!) 96.8 F (36 C)  SpO2: 95%   Wt Readings from Last 3 Encounters:  04/10/20 147 lb 12.8 oz (67 kg)  03/20/20 147 lb 12.8 oz (67 kg)  03/01/20 146 lb (66.2 kg)   Physical Exam Vitals reviewed.  Constitutional:      Appearance: Normal appearance.  Cardiovascular:     Rate and Rhythm: Normal rate and regular rhythm.     Pulses: Normal pulses.     Heart sounds: Normal heart sounds.  Pulmonary:     Effort: Pulmonary effort is normal.     Breath sounds: Normal breath sounds.  Chest:     Comments: Port-a-Cath in R chest Musculoskeletal:     Right lower leg: No edema.     Left lower leg: No edema.  Lymphadenopathy:     Cervical: No cervical adenopathy.     Upper Body:     Right upper body: No supraclavicular, axillary or pectoral adenopathy.     Left upper body: No supraclavicular, axillary or pectoral adenopathy.  Neurological:     General: No focal deficit present.     Mental Status: He is alert and oriented to person, place, and time.  Psychiatric:        Mood and Affect: Mood normal.        Behavior: Behavior normal.     LABORATORY DATA:  I have  reviewed the labs as listed.  CBC Latest Ref Rng & Units 04/10/2020 03/20/2020 02/27/2020  WBC 4.0 - 10.5 K/uL 4.3 4.9 3.6(L)  Hemoglobin 13.0 - 17.0 g/dL 10.2(L) 9.9(L) 9.9(L)  Hematocrit 39 - 52 % 31.8(L) 30.4(L) 30.8(L)  Platelets 150 - 400 K/uL 254 267 268   CMP Latest Ref Rng & Units 04/10/2020 03/20/2020 02/27/2020  Glucose 70 - 99 mg/dL 101(H) 93 101(H)  BUN 8 - 23 mg/dL 22 14 18   Creatinine 0.61 - 1.24 mg/dL 0.98 0.86 0.98  Sodium 135 - 145 mmol/L 137 135 136  Potassium 3.5 - 5.1 mmol/L 4.1 4.6 4.1  Chloride 98 - 111 mmol/L 106 104 106  CO2 22 - 32 mmol/L 24 25 23   Calcium 8.9 - 10.3 mg/dL 8.7(L) 8.8(L) 8.7(L)  Total Protein 6.5 - 8.1 g/dL 6.6 6.5 6.6  Total Bilirubin 0.3 - 1.2 mg/dL 0.5 0.8 0.5  Alkaline Phos 38 - 126 U/L 54 48 48  AST 15 - 41 U/L 16 15 17   ALT 0 - 44 U/L 14 12 15     DIAGNOSTIC IMAGING:  I have independently reviewed the scans and discussed with the patient. No results found.   ASSESSMENT:  1. 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 -CT CAP on 12/15/2019 showed enlarging prevascular and right paratracheal lymph nodes. Stable left lower lobe lung nodules. -CT neck on 12/15/2019 showed malignant lymphadenopathy in the lower right posterior triangle and jugular chain. -Lurbinectidin 3.2mg /kgstarted on 12/22/2019. -CT neck on 02/23/2020 showed marked reduction of lower right neck and right supraclavicular adenopathy. -CT CAP on 02/23/2020 showed treatment response with resolved mediastinal adenopathy. No evidence of new or progressive metastatic disease. Scattered bilateral pulmonary nodules.   PLAN:  1. 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  He denies any falls. -Reviewed labs today.  LFTs are normal.  Magnesium is 2.1.  White count and platelet count are adequate. -We will dose reduce Lurbi at 2.6 mg per metered squared. -RTC 3 weeks for  follow-up.  2. Normocytic anemia: -Hemoglobin today is 10.2.  Last ferritin was 235.  3. Chronic pain: -Continue Percocet 1 and half tablets twice daily.  4. Peripheral neuropathy: -Continue gabapentin 600 mg 3 times a day.  5. Sleeping difficulty: -Continue Vistaril as needed.   Orders placed this encounter:  No orders of the defined types were placed in this encounter.    Derek Jack, MD [facility] [phone]   I, Milinda Antis, am acting as a scribe for Dr. Sanda Linger.  I, Derek Jack MD, have reviewed the above documentation for accuracy and completeness, and I agree with the above.

## 2020-04-10 NOTE — Progress Notes (Signed)
Labs reviewed with MD today. Will proceed as planned per MD. Will give Flu vaccine per orders.   Treatment given per orders. Patient tolerated it well without problems. Vitals stable and discharged home from clinic ambulatory in stable condition. Follow up as scheduled.

## 2020-04-23 ENCOUNTER — Other Ambulatory Visit (HOSPITAL_COMMUNITY): Payer: Self-pay | Admitting: Hematology

## 2020-04-23 DIAGNOSIS — G620 Drug-induced polyneuropathy: Secondary | ICD-10-CM

## 2020-04-23 DIAGNOSIS — C3490 Malignant neoplasm of unspecified part of unspecified bronchus or lung: Secondary | ICD-10-CM

## 2020-04-25 ENCOUNTER — Other Ambulatory Visit (HOSPITAL_COMMUNITY): Payer: Self-pay | Admitting: *Deleted

## 2020-04-25 DIAGNOSIS — C3490 Malignant neoplasm of unspecified part of unspecified bronchus or lung: Secondary | ICD-10-CM

## 2020-04-25 MED ORDER — OXYCODONE-ACETAMINOPHEN 5-325 MG PO TABS: 1.0000 | ORAL_TABLET | Freq: Two times a day (BID) | ORAL | 0 refills | Status: DC | PRN

## 2020-05-01 ENCOUNTER — Inpatient Hospital Stay (HOSPITAL_COMMUNITY): Payer: Medicare HMO | Attending: Hematology | Admitting: Hematology

## 2020-05-01 ENCOUNTER — Inpatient Hospital Stay (HOSPITAL_COMMUNITY): Payer: Medicare HMO

## 2020-05-01 ENCOUNTER — Other Ambulatory Visit: Payer: Self-pay

## 2020-05-01 VITALS — BP 128/60 | HR 56 | Temp 97.5°F | Resp 18

## 2020-05-01 VITALS — BP 138/63 | HR 50 | Temp 96.8°F | Resp 18 | Wt 148.0 lb

## 2020-05-01 DIAGNOSIS — Z5111 Encounter for antineoplastic chemotherapy: Secondary | ICD-10-CM | POA: Diagnosis not present

## 2020-05-01 DIAGNOSIS — C3490 Malignant neoplasm of unspecified part of unspecified bronchus or lung: Secondary | ICD-10-CM

## 2020-05-01 DIAGNOSIS — C770 Secondary and unspecified malignant neoplasm of lymph nodes of head, face and neck: Secondary | ICD-10-CM | POA: Insufficient documentation

## 2020-05-01 DIAGNOSIS — C787 Secondary malignant neoplasm of liver and intrahepatic bile duct: Secondary | ICD-10-CM | POA: Diagnosis not present

## 2020-05-01 LAB — CBC WITH DIFFERENTIAL/PLATELET
Abs Immature Granulocytes: 0.01 10*3/uL (ref 0.00–0.07)
Basophils Absolute: 0 10*3/uL (ref 0.0–0.1)
Basophils Relative: 1 %
Eosinophils Absolute: 0.2 10*3/uL (ref 0.0–0.5)
Eosinophils Relative: 4 %
HCT: 31.7 % — ABNORMAL LOW (ref 39.0–52.0)
Hemoglobin: 10.3 g/dL — ABNORMAL LOW (ref 13.0–17.0)
Immature Granulocytes: 0 %
Lymphocytes Relative: 27 %
Lymphs Abs: 1.2 10*3/uL (ref 0.7–4.0)
MCH: 31.7 pg (ref 26.0–34.0)
MCHC: 32.5 g/dL (ref 30.0–36.0)
MCV: 97.5 fL (ref 80.0–100.0)
Monocytes Absolute: 0.5 10*3/uL (ref 0.1–1.0)
Monocytes Relative: 11 %
Neutro Abs: 2.7 10*3/uL (ref 1.7–7.7)
Neutrophils Relative %: 57 %
Platelets: 193 10*3/uL (ref 150–400)
RBC: 3.25 MIL/uL — ABNORMAL LOW (ref 4.22–5.81)
RDW: 21.8 % — ABNORMAL HIGH (ref 11.5–15.5)
WBC: 4.6 10*3/uL (ref 4.0–10.5)
nRBC: 0 % (ref 0.0–0.2)

## 2020-05-01 LAB — COMPREHENSIVE METABOLIC PANEL
ALT: 13 U/L (ref 0–44)
AST: 16 U/L (ref 15–41)
Albumin: 3.7 g/dL (ref 3.5–5.0)
Alkaline Phosphatase: 52 U/L (ref 38–126)
Anion gap: 10 (ref 5–15)
BUN: 19 mg/dL (ref 8–23)
CO2: 25 mmol/L (ref 22–32)
Calcium: 8.7 mg/dL — ABNORMAL LOW (ref 8.9–10.3)
Chloride: 104 mmol/L (ref 98–111)
Creatinine, Ser: 0.91 mg/dL (ref 0.61–1.24)
GFR, Estimated: >60 mL/min (ref >60)
Glucose, Bld: 106 mg/dL — ABNORMAL HIGH (ref 70–99)
Potassium: 4 mmol/L (ref 3.5–5.1)
Sodium: 139 mmol/L (ref 135–145)
Total Bilirubin: 0.6 mg/dL (ref 0.3–1.2)
Total Protein: 6.7 g/dL (ref 6.5–8.1)

## 2020-05-01 LAB — MAGNESIUM: Magnesium: 2.1 mg/dL (ref 1.7–2.4)

## 2020-05-01 LAB — LACTATE DEHYDROGENASE: LDH: 134 U/L (ref 98–192)

## 2020-05-01 MED ORDER — SODIUM CHLORIDE 0.9 % IV SOLN: Freq: Once | INTRAVENOUS | Status: AC

## 2020-05-01 MED ORDER — SODIUM CHLORIDE 0.9% FLUSH
10.0000 mL | INTRAVENOUS | Status: DC | PRN
  Administered 2020-05-01: 10 mL

## 2020-05-01 MED ORDER — HEPARIN SOD (PORK) LOCK FLUSH 100 UNIT/ML IV SOLN
500.0000 [IU] | Freq: Once | INTRAVENOUS | Status: AC | PRN
  Administered 2020-05-01: 500 [IU]

## 2020-05-01 MED ORDER — SODIUM CHLORIDE 0.9 % IV SOLN
2.6000 mg/m2 | Freq: Once | INTRAVENOUS | Status: AC
  Administered 2020-05-01: 4.75 mg via INTRAVENOUS
  Filled 2020-05-01: qty 9.5

## 2020-05-01 MED ORDER — SODIUM CHLORIDE 0.9 % IV SOLN
10.0000 mg | Freq: Once | INTRAVENOUS | Status: AC
  Administered 2020-05-01: 10 mg via INTRAVENOUS
  Filled 2020-05-01: qty 10

## 2020-05-01 MED ORDER — PALONOSETRON HCL INJECTION 0.25 MG/5ML
0.2500 mg | Freq: Once | INTRAVENOUS | Status: AC
  Administered 2020-05-01: 0.25 mg via INTRAVENOUS
  Filled 2020-05-01: qty 5

## 2020-05-01 NOTE — Progress Notes (Signed)
Patient was assessed by Dr. Katragadda and labs have been reviewed.  Patient is okay to proceed with treatment today. Primary RN and pharmacy aware.   

## 2020-05-01 NOTE — Progress Notes (Signed)
[address], [place] [address]   CLINIC:  Medical Oncology/Hematology  PCP:  [name], MD [address] 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 Bulky 5.4 cm right supraclavicular region malignant lymph node conglomeration with extracapsular extension. 2. Surrounding smaller abnormal right level 3 and level 5 lymph nodes, and the lymphadenopathy continues into the superior mediastinum, see Chest CT findings reported separately. 3. No other metastatic disease identified in the neck.   01/16/2017 Imaging   CT chest: IMPRESSION: 1. Extensive lymphadenopathy in the thorax and lower right cervical region, as discussed above. Primary differential considerations include lymphoma/leukemia or small cell carcinoma of the lung. Further evaluation a PET-CT could be considered to assess for additional sites of disease below the diaphragm if clinically appropriate. Additionally, ultrasound-guided biopsy of supraclavicular lymphadenopathy could be considered to establish a tissue diagnosis. 2. Indeterminate lesion in the periphery of segment 8 of the liver measuring 2.7 x 1.7 cm. Attention at time of follow-up PET-CT is recommended. 3. Aortic atherosclerosis, in addition to left main and 3 vessel coronary artery disease. Please note that although the presence of coronary artery calcium documents the presence of coronary artery disease, the severity of this disease and any potential stenosis cannot be assessed on this non-gated CT examination. Assessment for potential risk factor modification,  dietary therapy or pharmacologic therapy may be warranted, if clinically indicated. 4. There are calcifications of the aortic valve. Echocardiographic correlation for evaluation of potential valvular dysfunction may be warranted if clinically indicated. 5. Diffuse bronchial wall thickening with moderate centrilobular and paraseptal emphysema; imaging findings suggestive of underlying COPD.   02/03/2017 Initial Biopsy   (R) neck lymph node biopsy: SMALL CELL CARCINOMA (most likely lung primary).    02/03/2017 Miscellaneous   Port-a-cath attempted by IR; unable to place d/t enlarged SVC.    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 Staging for metastatic disease  EXAM: MRI HEAD WITHOUT AND WITH CONTRAST  TECHNIQUE: Multiplanar, multiecho pulse sequences of the brain and surrounding structures were obtained without and with intravenous contrast.  CONTRAST:  6m MULTIHANCE GADOBENATE DIMEGLUMINE 529 MG/ML IV SOLN  COMPARISON:  None.  FINDINGS: Brain: Negative for hydrocephalus. Cerebral volume normal for age. Small nonenhancing white matter hyperintensities consistent with mild chronic microvascular ischemia. No acute infarct. Negative for hemorrhage or mass or edema  Normal enhancement postcontrast infusion. No enhancing mass lesion. Leptomeningeal enhancement is normal.  Vascular: Normal arterial flow voids.  Normal venous enhancement  Skull and upper cervical spine: Negative  Sinuses/Orbits: Negative  Other: None  IMPRESSION: Negative for metastatic disease.  No acute abnormality.  Mild chronic white matter changes.   04/07/2017 Imaging    PET:  1. Marked reduction in size and metabolic activity of bulky RIGHT supraclavicular adenopathy  mediastinal lymphadenopathy. 2. Residual moderate activity remains within small RIGHT supraclavicular lymph node, RIGHT lower paratracheal lymph node and RIGHT  hilar lymph node. 3. Resolution of prevascular and internal mammary mediastinal metastatic hypermetabolic activity. 4. Resolution of metabolic activity associated with solitary RIGHT hepatic lobe liver metastasis. 5. No evidence of disease progression. 6. 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   05/27/2017 Imaging   MRI brain w/ and w/o contrast IMPRESSION: 1. No metastatic disease identified. 2. Increased nonspecific cerebral white matter signal changes since August. These are most commonly small vessel disease related. 3. New right maxillary sinusitis. Benign appearing retention cysts in the nasopharynx with trace mastoid effusions.   06/12/2017 Imaging   PET-CT IMPRESSION: 1. There are two new hypermetabolic nodules identified within both lower lobes measuring up to 3.1 cm. The appearance is nonspecific and may be inflammatory/infectious in etiology. Pulmonary metastatic disease cannot be excluded and short-term follow-up imaging in 3 months is advised to reassess these nodules. 2. Stable appearance of mild hypermetabolic activity associated with right paratracheal and right hilar lymph nodes. 3. Decrease in FDG uptake associated with index right supraclavicular lymph node. 4. 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 5. Aortic Atherosclerosis (ICD10-I70.0) and Emphysema (ICD10-J43.9).   08/24/2017 - 11/18/2019 Chemotherapy   The patient had pegfilgrastim (NEULASTA ONPRO KIT) injection 6 mg, 6 mg, Subcutaneous, Once, 30 of 31 cycles Administration: 6 mg (08/28/2017), 6 mg (09/18/2017), 6 mg (10/09/2017), 6 mg (11/02/2017), 6 mg  (11/20/2017), 6 mg (12/18/2017), 6 mg (01/15/2018), 6 mg (02/12/2018), 6 mg (03/12/2018), 6 mg (04/09/2018), 6 mg (05/07/2018), 6 mg (06/04/2018), 6 mg (07/02/2018), 6 mg (07/30/2018), 6 mg (08/27/2018), 6 mg (09/24/2018), 6 mg (10/29/2018), 6 mg (11/26/2018), 6 mg (12/24/2018), 6 mg (01/21/2019), 6 mg (02/18/2019), 6 mg (03/18/2019), 6 mg (04/15/2019), 6 mg (05/13/2019), 6 mg (06/17/2019), 6 mg (07/22/2019), 6 mg (08/19/2019), 6 mg (09/16/2019), 6 mg (10/21/2019), 6 mg (11/18/2019) topotecan (HYCAMTIN) 2.9 mg in sodium chloride 0.9 % 100 mL chemo infusion, 1.5 mg/m2 = 2.9 mg, Intravenous,  Once, 30 of 31 cycles Dose modification: 1.2 mg/m2 (80 % of original dose 1.5 mg/m2, Cycle 4, Reason: Dose Not Tolerated) Administration: 2.9 mg (08/24/2017), 2.9 mg (08/25/2017), 2.9 mg (08/28/2017), 2.9 mg (08/26/2017), 2.9 mg (08/27/2017), 2.9 mg (09/14/2017), 2.9 mg (09/15/2017), 2.9 mg (09/16/2017), 2.9 mg (09/17/2017), 2.9 mg (09/18/2017), 2.9 mg (10/05/2017), 2.9 mg (10/06/2017), 2.9 mg (10/07/2017), 2.9 mg (10/08/2017), 2.9 mg (10/09/2017), 2.3 mg (10/26/2017), 2.3 mg (10/27/2017), 2.3 mg (10/28/2017), 2.3 mg (10/29/2017), 2.3 mg (11/02/2017), 2.3 mg (11/16/2017), 2.3 mg (11/17/2017), 2.3 mg (11/18/2017), 2.3 mg (11/19/2017), 2.3 mg (11/20/2017), 2.3 mg (12/14/2017), 2.3 mg (12/15/2017), 2.3 mg (12/16/2017), 2.3 mg (12/17/2017), 2.3 mg (12/18/2017), 2.3 mg (01/11/2018), 2.3 mg (01/12/2018), 2.3 mg (01/13/2018), 2.3 mg (01/15/2018), 2.3 mg (02/08/2018), 2.3 mg (02/09/2018), 2.3 mg (02/10/2018), 2.3 mg (02/11/2018), 2.3 mg (02/12/2018), 2.3 mg (03/08/2018), 2.3 mg (03/09/2018), 2.3 mg (03/10/2018), 2.3 mg (03/11/2018), 2.3 mg (03/12/2018), 2.2 mg (04/05/2018), 2.2 mg (04/06/2018), 2.2 mg (04/07/2018), 2.2 mg (04/08/2018), 2.2 mg (04/09/2018), 2.2 mg (05/03/2018), 2.2 mg (05/04/2018), 2.2 mg (05/05/2018), 2.2 mg (05/06/2018), 2.2 mg (05/07/2018), 2.2 mg (05/31/2018), 2.2 mg (06/01/2018), 2.2 mg (06/02/2018), 2.2 mg (06/03/2018), 2.2 mg (06/04/2018), 2.2 mg (06/28/2018), 2.2 mg (06/29/2018), 2.2 mg (06/30/2018), 2.2 mg  (07/01/2018), 2.2 mg (07/02/2018), 2.2 mg (07/26/2018), 2.2 mg (07/27/2018), 2.2 mg (07/28/2018), 2.2 mg (07/29/2018), 2.2 mg (07/30/2018), 2.2 mg (08/23/2018), 2.2 mg (08/24/2018), 2.2 mg (08/25/2018), 2.2 mg (08/26/2018), 2.2 mg (08/27/2018), 2.2  mg (09/20/2018), 2.2 mg (09/21/2018), 2.2 mg (09/22/2018), 2.2 mg (09/23/2018), 2.2 mg (09/24/2018), 2.2 mg (10/25/2018), 2.2 mg (10/26/2018), 2.2 mg (10/27/2018), 2.2 mg (10/28/2018), 2.2 mg (10/29/2018), 2.2 mg (11/22/2018), 2.2 mg (11/23/2018), 2.2 mg (11/24/2018), 2.2 mg (11/25/2018), 2.2 mg (11/26/2018), 2.2 mg (12/20/2018), 2.2 mg (12/21/2018), 2.2 mg (12/22/2018), 2.2 mg (12/23/2018), 2.2 mg (12/24/2018), 2.2 mg (01/17/2019), 2.2 mg (01/18/2019), 2.2 mg (01/19/2019), 2.2 mg (01/20/2019), 2.2 mg (01/21/2019), 2.2 mg (02/14/2019), 2.2 mg (02/15/2019), 2.2 mg (02/16/2019), 2.2 mg (02/17/2019), 2.2 mg (02/18/2019), 2.2 mg (03/14/2019), 2.2 mg (03/15/2019), 2.2 mg (03/16/2019), 2.2 mg (03/17/2019), 2.2 mg (03/18/2019), 2.2 mg (04/11/2019), 2.2 mg (04/12/2019), 2.2 mg (04/13/2019), 2.2 mg (04/14/2019), 2.2 mg (04/15/2019), 2.2 mg (05/09/2019), 2.2 mg (05/10/2019), 2.2 mg (05/11/2019), 2.2 mg (05/12/2019), 2.2 mg (05/13/2019), 2.2 mg (06/13/2019), 2.2 mg (06/14/2019), 2.2 mg (06/15/2019), 2.2 mg (06/16/2019), 2.2 mg (06/17/2019), 2.2 mg (07/18/2019), 2.2 mg (07/19/2019), 2.2 mg (07/20/2019), 2.2 mg (07/21/2019), 2.2 mg (07/22/2019), 2.2 mg (08/15/2019), 2.2 mg (08/16/2019), 2.2 mg (08/17/2019), 2.2 mg (08/18/2019), 2.2 mg (08/19/2019), 2.2 mg (09/12/2019), 2.2 mg (09/13/2019), 2.2 mg (09/14/2019), 2.2 mg (09/15/2019), 2.2 mg (09/16/2019), 2.2 mg (10/17/2019), 2.2 mg (10/18/2019), 2.2 mg (10/19/2019), 2.2 mg (10/20/2019), 2.2 mg (10/21/2019), 2.2 mg (11/14/2019), 2.2 mg (11/15/2019), 2.2 mg (11/16/2019), 2.2 mg (11/17/2019), 2.2 mg (11/18/2019) ondansetron (ZOFRAN) 4 mg in sodium chloride 0.9 % 50 mL IVPB, , Intravenous,  Once, 25 of 26 cycles Administration:  (02/08/2018),  (02/09/2018),  (02/10/2018),  (02/11/2018),  (02/12/2018),  (03/08/2018),  (03/09/2018),  (03/10/2018),  (03/11/2018),   (03/12/2018),  (04/05/2018),  (04/06/2018),  (04/07/2018),  (04/08/2018),  (04/09/2018),  (05/03/2018),  (05/04/2018),  (05/05/2018),  (05/06/2018),  (05/07/2018),  (05/31/2018),  (06/01/2018),  (06/02/2018),  (06/03/2018),  (06/04/2018),  (06/28/2018),  (06/29/2018),  (06/30/2018),  (07/01/2018),  (07/02/2018),  (07/26/2018),  (07/27/2018),  (07/28/2018),  (07/29/2018),  (07/30/2018),  (08/23/2018),  (08/24/2018),  (08/25/2018),  (08/26/2018),  (08/27/2018),  (09/20/2018),  (09/21/2018),  (09/22/2018),  (09/23/2018),  (09/24/2018),  (10/25/2018),  (10/26/2018),  (10/27/2018),  (10/28/2018),  (10/29/2018),  (11/22/2018),  (11/23/2018),  (11/24/2018),  (11/25/2018),  (11/26/2018),  (12/20/2018),  (12/21/2018),  (12/22/2018),  (12/23/2018),  (12/24/2018),  (01/17/2019),  (01/18/2019),  (01/19/2019),  (01/20/2019),  (01/21/2019),  (02/14/2019),  (02/15/2019),  (02/16/2019),  (02/17/2019),  (02/18/2019),  (03/14/2019),  (03/15/2019),  (03/16/2019),  (03/17/2019),  (03/18/2019),  (04/11/2019),  (04/12/2019),  (04/13/2019),  (04/14/2019),  (04/15/2019),  (05/09/2019),  (05/10/2019),  (05/11/2019),  (05/12/2019),  (05/13/2019),  (06/13/2019),  (06/14/2019),  (06/15/2019),  (06/16/2019),  (06/17/2019),  (07/18/2019),  (07/19/2019),  (07/20/2019),  (07/21/2019),  (07/22/2019),  (08/15/2019),  (08/16/2019),  (08/17/2019),  (08/18/2019),  (08/19/2019),  (09/12/2019),  (09/13/2019),  (09/14/2019),  (09/15/2019),  (09/16/2019),  (10/17/2019),  (10/18/2019),  (10/19/2019),  (10/20/2019),  (10/21/2019),  (11/14/2019),  (11/15/2019),  (11/16/2019),  (11/17/2019),  (11/18/2019)  for chemotherapy treatment.    12/22/2019 -  Chemotherapy   The patient had palonosetron (ALOXI) injection 0.25 mg, 0.25 mg, Intravenous,  Once, 6 of 8 cycles Administration: 0.25 mg (12/22/2019), 0.25 mg (01/13/2020), 0.25 mg (02/06/2020), 0.25 mg (02/27/2020), 0.25 mg (03/20/2020), 0.25 mg (04/10/2020) lurbinectedin (ZEPZELCA) 5.8 mg in sodium chloride 0.9 % 250 mL chemo infusion, 3.2 mg/m2 = 5.8 mg, Intravenous,  Once, 6 of 8 cycles Dose modification: 2.6 mg/m2 (original dose 3.2  mg/m2, Cycle 5, Reason: Other (see comments), Comment: weakness) Administration: 5.8 mg (12/22/2019), 5.8 mg (01/13/2020), 5.8 mg (02/06/2020), 5.8 mg (02/27/2020), 4.75 mg (03/20/2020), 4.75 mg (04/10/2020)  for chemotherapy treatment.      CANCER STAGING: Cancer Staging No  matching staging information was found for the patient.  INTERVAL HISTORY:  Mr. [name], a 69 y.o. male, returns for routine follow-up and consideration for next cycle of chemotherapy. [name] was last seen on 04/10/2020.  Due for cycle #7 of lurbinectedin today.   Overall, he tells me he has been feeling pretty well. He reports having several episodes of light-headedness after the previous treatment, but denies having falls. He also gets dizzy when he looks up. He denies having N/V/D. His appetite is very low. He is taking Percocet 1.5 tablets BID.  Overall, he feels ready for next cycle of chemo today.    REVIEW OF SYSTEMS:  Review of Systems  Constitutional: Positive for appetite change (depleted) and fatigue (50%).  Respiratory: Positive for cough.   Gastrointestinal: Positive for constipation. Negative for diarrhea, nausea and vomiting.  Genitourinary: Positive for difficulty urinating (decreased volume and stream).   Neurological: Positive for dizziness, light-headedness and numbness (feet).  All other systems reviewed and are negative.   PAST MEDICAL/SURGICAL HISTORY:  Past Medical History:  Diagnosis Date  . Anxiety   . CAD (coronary artery disease)   . Cancer ([place])    stage 4 small cell lung cancer   . COPD (chronic obstructive pulmonary disease) ([place])   . Depression   . Dyspnea    increased exertion  . Feeling of chest tightness   . Heart palpitations   . History of chemotherapy   . Myocardial infarction ([place])   . Osteopenia   . Panic attacks   . Smoker    Past Surgical History:  Procedure Laterality Date  . BACK SURGERY  12/24/2000   L5,S1  . CORONARY STENT PLACEMENT  2005   RCA & CX    . HERNIA REPAIR Right 1980's  . INGUINAL HERNIA REPAIR  12/1978   right side  . IR FLUORO GUIDE PORT INSERTION RIGHT  04/02/2017  . IR US GUIDE BX ASP/DRAIN  02/03/2017  . IR US GUIDE VASC ACCESS RIGHT  04/02/2017  . NM MYOCAR PERF WALL MOTION  09/07/2009   No ischemia; EF 51%  . SHOULDER SURGERY Left 08/2010  . SPINE SURGERY  2002   L5-S1    SOCIAL HISTORY:  Social History   Socioeconomic History  . Marital status: Single    Spouse name: Not on file  . Number of children: Not on file  . Years of education: Not on file  . Highest education level: Not on file  Occupational History  . Not on file  Tobacco Use  . Smoking status: Current Every Day Smoker    Packs/day: 1.00    Types: Cigarettes  . Smokeless tobacco: Never Used  Vaping Use  . Vaping Use: Never used  Substance and Sexual Activity  . Alcohol use: Yes    Comment: occas  . Drug use: No  . Sexual activity: Not on file  Other Topics Concern  . Not on file  Social History Narrative  . Not on file   Social Determinants of Health   Financial Resource Strain:   . Difficulty of Paying Living Expenses: Not on file  Food Insecurity:   . Worried About Charity fundraiser in the Last Year: Not on file  . Ran Out of Food in the Last Year: Not on file  Transportation Needs:   . Lack of Transportation (Medical): Not on file  . Lack of Transportation (Non-Medical): Not on file  Physical Activity:   . Days of Exercise per Week:  Not on file  . Minutes of Exercise per Session: Not on file  Stress:   . Feeling of Stress : Not on file  Social Connections:   . Frequency of Communication with Friends and Family: Not on file  . Frequency of Social Gatherings with Friends and Family: Not on file  . Attends Religious Services: Not on file  . Active Member of Clubs or Organizations: Not on file  . Attends Archivist Meetings: Not on file  . Marital Status: Not on file  Intimate Partner Violence:   . Fear of Current  or Ex-Partner: Not on file  . Emotionally Abused: Not on file  . Physically Abused: Not on file  . Sexually Abused: Not on file    FAMILY HISTORY:  Family History  Problem Relation Age of Onset  . Heart attack Father   . Kidney disease Father        renal failure  . Heart failure Mother   . Heart attack Mother   . Cancer Brother   . Diabetes Brother   . Alcohol abuse Brother   . Diabetes Sister     CURRENT MEDICATIONS:  Current Outpatient Medications  Medication Sig Dispense Refill  . albuterol (VENTOLIN HFA) 108 (90 Base) MCG/ACT inhaler Inhale 2 puffs into the lungs every 6 (six) hours as needed for wheezing or shortness of breath.     . ALPRAZolam (XANAX) 1 MG tablet TAKE (1) TABLET BY MOUTH (4) TIMES DAILY. 120 tablet 3  . Ascorbic Acid (VITAMIN C) 1000 MG tablet Take 1,000 mg by mouth daily.    Marland Kitchen aspirin EC 81 MG tablet Take 81 mg by mouth daily.    Marland Kitchen atorvastatin (LIPITOR) 40 MG tablet TAKE (1) TABLET BY MOUTH ONCE DAILY. 90 tablet 1  . calcium-vitamin D (OSCAL WITH D) 250-125 MG-UNIT tablet Take 1 tablet by mouth daily.    . cholecalciferol (VITAMIN D3) 25 MCG (1000 UT) tablet Take 1,000 Units by mouth daily.    . clotrimazole-betamethasone (LOTRISONE) cream Apply 1 application topically 2 (two) times daily. 30 g 0  . docusate sodium (COLACE) 100 MG capsule Take two capsules at bedtime starting the day you receive chemotherapy and then for four days after 30 capsule 3  . gabapentin (NEURONTIN) 300 MG capsule TAKE (2) CAPSULES BY MOUTH THREE TIMES DAILY. 180 capsule 6  . hydrOXYzine (ATARAX/VISTARIL) 50 MG tablet TAKE ONE TABLET BY MOUTH AT BEDTIME AS NEEDED FOR SLEEP. 30 tablet 0  . Multiple Vitamins-Minerals (CENTRUM SILVER 50+MEN) TABS Take 1 tablet by mouth daily.    Marland Kitchen oxyCODONE-acetaminophen (PERCOCET/ROXICET) 5-325 MG tablet Take 1 tablet by mouth 2 (two) times daily as needed for severe pain. 90 tablet 0  . Pegfilgrastim (NEULASTA ONPRO [place]) Inject into the skin. Every  21 days    . senna (SENOKOT) 8.6 MG tablet Take 3 tablets by mouth daily.     . topotecan in sodium chloride 0.9 % 100 mL Inject into the vein once. Days 1-5 every 21 days    . vitamin B-12 (CYANOCOBALAMIN) 100 MCG tablet Take 100 mcg by mouth daily.    . ondansetron (ZOFRAN) 8 MG tablet Take 1 tablet (8 mg total) by mouth 2 (two) times daily as needed. Start on the third day after cisplatin chemotherapy. (Patient not taking: Reported on 05/01/2020) 30 tablet 1  . prochlorperazine (COMPAZINE) 10 MG tablet Take 1 tablet (10 mg total) by mouth every 6 (six) hours as needed (Nausea or vomiting). (Patient not taking:  Reported on 05/01/2020) 60 tablet 1   No current facility-administered medications for this visit.    ALLERGIES:  Allergies  Allergen Reactions  . Codeine Nausea Only  . Niaspan [Niacin Er]     PHYSICAL EXAM:  Performance status (ECOG): 1 - Symptomatic but completely ambulatory  Vitals:   05/01/20 0955  BP: 138/63  Pulse: (!) 50  Resp: 18  Temp: (!) 96.8 F (36 C)  SpO2: 95%   Wt Readings from Last 3 Encounters:  05/01/20 148 lb (67.1 kg)  04/10/20 147 lb 12.8 oz (67 kg)  03/20/20 147 lb 12.8 oz (67 kg)   Physical Exam Vitals reviewed.  Constitutional:      Appearance: Normal appearance.  Cardiovascular:     Rate and Rhythm: Normal rate and regular rhythm.     Pulses: Normal pulses.     Heart sounds: Normal heart sounds.  Pulmonary:     Effort: Pulmonary effort is normal.     Breath sounds: Normal breath sounds.  Chest:     Comments: Port-a-Cath in R chest Lymphadenopathy:     Upper Body:     Right upper body: No supraclavicular, axillary or pectoral adenopathy.     Left upper body: No supraclavicular, axillary or pectoral adenopathy.  Neurological:     General: No focal deficit present.     Mental Status: He is alert and oriented to person, place, and time.  Psychiatric:        Mood and Affect: Mood normal.        Behavior: Behavior normal.      LABORATORY DATA:  I have reviewed the labs as listed.  CBC Latest Ref Rng & Units 05/01/2020 04/10/2020 03/20/2020  WBC 4.0 - 10.5 K/uL 4.6 4.3 4.9  Hemoglobin 13.0 - 17.0 g/dL 10.3(L) 10.2(L) 9.9(L)  Hematocrit 39 - 52 % 31.7(L) 31.8(L) 30.4(L)  Platelets 150 - 400 K/uL 193 254 267   CMP Latest Ref Rng & Units 05/01/2020 04/10/2020 03/20/2020  Glucose 70 - 99 mg/dL 106(H) 101(H) 93  BUN 8 - 23 mg/dL _0 Creatinine 0.61 - 1.24 mg/dL 0.91 0.98 0.86  Sodium 135 - 145 mmol/L 139 137 135  Potassium 3.5 - 5.1 mmol/L 4.0 4.1 4.6  Chloride 98 - 111 mmol/L 104 106 104  CO2 22 - 32 mmol/L _1 Calcium 8.9 - 10.3 mg/dL 8.7(L) 8.7(L) 8.8(L)  Total Protein 6.5 - 8.1 g/dL 6.7 6.6 6.5  Total Bilirubin 0.3 - 1.2 mg/dL 0.6 0.5 0.8  Alkaline Phos 38 - 126 U/L 52 54 48  AST 15 - 41 U/L _2 ALT 0 - 44 U/L _3 Lab Results  Component Value Date   LDH 134 05/01/2020   LDH 135 03/20/2020   LDH 134 02/06/2020    DIAGNOSTIC IMAGING:  I have independently reviewed the scans and discussed with the patient. No results found.   ASSESSMENT:  1. 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 -CT CAP on 12/15/2019 showed enlarging prevascular and right paratracheal lymph nodes. Stable left lower lobe lung nodules. -CT neck on 12/15/2019 showed malignant lymphadenopathy in the lower right posterior triangle and jugular chain. -Lurbinectidin 3.64m/kgstarted on 12/22/2019. -CT neck on 02/23/2020 showed marked reduction of lower right neck and right supraclavicular adenopathy. -CT CAP on 02/23/2020 showed treatment response with resolved mediastinal adenopathy. No evidence of new or progressive metastatic disease. Scattered bilateral pulmonary nodules.   PLAN:  1. 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 -Reviewed his LFTs which are grossly within normal limits.  White count is  4.6 with adequate ANC. -We will dose reduce Lurbi to 2.32m/m2. -I will order CT neck, chest and abdomen prior to next visit in 3 weeks to evaluate response.  2. Normocytic anemia: -Hemoglobin is 10.3.  Last ferritin was 325.  No iron infusion needed.  3. Chronic pain: -Continue Percocet 1 and half tablets twice daily.  Pain is adequately controlled.  4. Peripheral neuropathy: -Continue gabapentin 600 mg 3 times daily.  No worsening noted.  5. Sleeping difficulty: -Continue Vistaril as needed.   Orders placed this encounter:  No orders of the defined types were placed in this encounter.    SDerek Jack MD [phone]  I, DMilinda Antis am acting as a scribe for Dr. SSanda Linger  I, SDerek JackMD, have reviewed the above documentation for accuracy and completeness, and I agree with the above.

## 2020-05-01 NOTE — Progress Notes (Signed)
Labs reviewed today for treatment. Will proceed as planned per MD.   Treatment given per orders. Patient tolerated it well without problems. Vitals stable and discharged home from clinic ambulatory in stable condition. Follow up as scheduled.

## 2020-05-01 NOTE — Patient Instructions (Signed)
[place] at [facility] Discharge Instructions  You were seen today by Dr. Delton Coombes. He went over your recent results. You received your treatment today. You will be scheduled for a CT scan of your neck, chest and abdomen before your next visit. Dr. Delton Coombes will see you back in 3 weeks for labs and follow up.   Thank you for choosing [place] at [facility] to provide your oncology and hematology care.  To afford each patient quality time with our provider, please arrive at least 15 minutes before your scheduled appointment time.   If you have a lab appointment with the [facility] please come in thru the Main Entrance and check in at the main information desk  You need to re-schedule your appointment should you arrive 10 or more minutes late.  We strive to give you quality time with our providers, and arriving late affects you and other patients whose appointments are after yours.  Also, if you no show three or more times for appointments you may be dismissed from the clinic at the providers discretion.     Again, thank you for choosing [facility].  Our hope is that these requests will decrease the amount of time that you wait before being seen by our physicians.       _____________________________________________________________  Should you have questions after your visit to [facility], please contact our office at [phone] between the hours of 8:00 a.m. and 4:30 p.m.  Voicemails left after 4:00 p.m. will not be returned until the following business day.  For prescription refill requests, have your pharmacy contact our office and allow 72 hours.    Cancer Center Support Programs:   > Cancer Support Group  2nd Tuesday of the month 1pm-2pm, Journey Room

## 2020-05-01 NOTE — Patient Instructions (Signed)
[facility] Discharge Instructions for Patients Receiving Chemotherapy  Today you received the following chemotherapy agents   To help prevent nausea and vomiting after your treatment, we encourage you to take your nausea medication   If you develop nausea and vomiting that is not controlled by your nausea medication, call the clinic.   BELOW ARE SYMPTOMS THAT SHOULD BE REPORTED IMMEDIATELY:  *FEVER GREATER THAN 100.5 F  *CHILLS WITH OR WITHOUT FEVER  NAUSEA AND VOMITING THAT IS NOT CONTROLLED WITH YOUR NAUSEA MEDICATION  *UNUSUAL SHORTNESS OF BREATH  *UNUSUAL BRUISING OR BLEEDING  TENDERNESS IN MOUTH AND THROAT WITH OR WITHOUT PRESENCE OF ULCERS  *URINARY PROBLEMS  *BOWEL PROBLEMS  UNUSUAL RASH Items with * indicate a potential emergency and should be followed up as soon as possible.  Feel free to call the clinic should you have any questions or concerns. The clinic phone number is [phone].  Please show the CHEMO ALERT CARD at check-in to the Emergency Department and triage nurse.   

## 2020-05-17 ENCOUNTER — Other Ambulatory Visit: Payer: Self-pay

## 2020-05-17 ENCOUNTER — Ambulatory Visit (HOSPITAL_COMMUNITY)
Admission: RE | Admit: 2020-05-17 | Discharge: 2020-05-17 | Disposition: A | Discharge status: Home / Self Care | Payer: Medicare HMO | Source: Ambulatory Visit | Attending: Hematology | Admitting: Hematology

## 2020-05-17 DIAGNOSIS — J439 Emphysema, unspecified: Secondary | ICD-10-CM | POA: Diagnosis not present

## 2020-05-17 DIAGNOSIS — N2889 Other specified disorders of kidney and ureter: Secondary | ICD-10-CM | POA: Diagnosis not present

## 2020-05-17 DIAGNOSIS — J392 Other diseases of pharynx: Secondary | ICD-10-CM | POA: Diagnosis not present

## 2020-05-17 DIAGNOSIS — C3490 Malignant neoplasm of unspecified part of unspecified bronchus or lung: Secondary | ICD-10-CM

## 2020-05-17 MED ORDER — IOHEXOL 300 MG/ML  SOLN
100.0000 mL | Freq: Once | INTRAMUSCULAR | Status: AC | PRN
  Administered 2020-05-17: 100 mL via INTRAVENOUS

## 2020-05-22 ENCOUNTER — Inpatient Hospital Stay (HOSPITAL_COMMUNITY): Payer: Medicare HMO

## 2020-05-22 ENCOUNTER — Other Ambulatory Visit: Payer: Self-pay

## 2020-05-22 ENCOUNTER — Inpatient Hospital Stay (HOSPITAL_COMMUNITY): Payer: Medicare HMO | Attending: Hematology | Admitting: Hematology

## 2020-05-22 VITALS — BP 138/66 | HR 55 | Temp 96.8°F | Resp 18 | Wt 146.7 lb

## 2020-05-22 VITALS — BP 126/50 | HR 49 | Temp 96.8°F | Resp 18

## 2020-05-22 DIAGNOSIS — C787 Secondary malignant neoplasm of liver and intrahepatic bile duct: Secondary | ICD-10-CM | POA: Diagnosis not present

## 2020-05-22 DIAGNOSIS — Z5111 Encounter for antineoplastic chemotherapy: Secondary | ICD-10-CM | POA: Insufficient documentation

## 2020-05-22 DIAGNOSIS — Z79899 Other long term (current) drug therapy: Secondary | ICD-10-CM | POA: Insufficient documentation

## 2020-05-22 DIAGNOSIS — C3490 Malignant neoplasm of unspecified part of unspecified bronchus or lung: Secondary | ICD-10-CM

## 2020-05-22 LAB — CBC WITH DIFFERENTIAL/PLATELET
Abs Immature Granulocytes: 0.02 10*3/uL (ref 0.00–0.07)
Basophils Absolute: 0 10*3/uL (ref 0.0–0.1)
Basophils Relative: 1 %
Eosinophils Absolute: 0.1 10*3/uL (ref 0.0–0.5)
Eosinophils Relative: 3 %
HCT: 33.3 % — ABNORMAL LOW (ref 39.0–52.0)
Hemoglobin: 10.7 g/dL — ABNORMAL LOW (ref 13.0–17.0)
Immature Granulocytes: 1 %
Lymphocytes Relative: 27 %
Lymphs Abs: 1.1 10*3/uL (ref 0.7–4.0)
MCH: 31.8 pg (ref 26.0–34.0)
MCHC: 32.1 g/dL (ref 30.0–36.0)
MCV: 98.8 fL (ref 80.0–100.0)
Monocytes Absolute: 0.6 10*3/uL (ref 0.1–1.0)
Monocytes Relative: 15 %
Neutro Abs: 2.2 10*3/uL (ref 1.7–7.7)
Neutrophils Relative %: 53 %
Platelets: 250 10*3/uL (ref 150–400)
RBC: 3.37 MIL/uL — ABNORMAL LOW (ref 4.22–5.81)
RDW: 20.4 % — ABNORMAL HIGH (ref 11.5–15.5)
WBC: 4 10*3/uL (ref 4.0–10.5)
nRBC: 0 % (ref 0.0–0.2)

## 2020-05-22 LAB — COMPREHENSIVE METABOLIC PANEL
ALT: 14 U/L (ref 0–44)
AST: 17 U/L (ref 15–41)
Albumin: 4 g/dL (ref 3.5–5.0)
Alkaline Phosphatase: 55 U/L (ref 38–126)
Anion gap: 9 (ref 5–15)
BUN: 13 mg/dL (ref 8–23)
CO2: 26 mmol/L (ref 22–32)
Calcium: 9 mg/dL (ref 8.9–10.3)
Chloride: 101 mmol/L (ref 98–111)
Creatinine, Ser: 1 mg/dL (ref 0.61–1.24)
GFR, Estimated: >60 mL/min (ref >60)
Glucose, Bld: 97 mg/dL (ref 70–99)
Potassium: 4.4 mmol/L (ref 3.5–5.1)
Sodium: 136 mmol/L (ref 135–145)
Total Bilirubin: 0.5 mg/dL (ref 0.3–1.2)
Total Protein: 6.8 g/dL (ref 6.5–8.1)

## 2020-05-22 LAB — MAGNESIUM: Magnesium: 2.1 mg/dL (ref 1.7–2.4)

## 2020-05-22 LAB — LACTATE DEHYDROGENASE: LDH: 129 U/L (ref 98–192)

## 2020-05-22 MED ORDER — SODIUM CHLORIDE 0.9 % IV SOLN
10.0000 mg | Freq: Once | INTRAVENOUS | Status: AC
  Administered 2020-05-22: 10 mg via INTRAVENOUS
  Filled 2020-05-22: qty 10

## 2020-05-22 MED ORDER — HEPARIN SOD (PORK) LOCK FLUSH 100 UNIT/ML IV SOLN
500.0000 [IU] | Freq: Once | INTRAVENOUS | Status: AC | PRN
  Administered 2020-05-22: 500 [IU]

## 2020-05-22 MED ORDER — PALONOSETRON HCL INJECTION 0.25 MG/5ML
0.2500 mg | Freq: Once | INTRAVENOUS | Status: AC
  Administered 2020-05-22: 0.25 mg via INTRAVENOUS
  Filled 2020-05-22: qty 5

## 2020-05-22 MED ORDER — ATROPINE SULFATE 1 MG/ML IJ SOLN
INTRAMUSCULAR | Status: AC
  Filled 2020-05-22: qty 1

## 2020-05-22 MED ORDER — PALONOSETRON HCL INJECTION 0.25 MG/5ML
INTRAVENOUS | Status: AC
  Filled 2020-05-22: qty 5

## 2020-05-22 MED ORDER — SODIUM CHLORIDE 0.9 % IV SOLN
2.6000 mg/m2 | Freq: Once | INTRAVENOUS | Status: AC
  Administered 2020-05-22: 4.75 mg via INTRAVENOUS
  Filled 2020-05-22: qty 9.5

## 2020-05-22 MED ORDER — SODIUM CHLORIDE 0.9% FLUSH
10.0000 mL | INTRAVENOUS | Status: DC | PRN
  Administered 2020-05-22: 10 mL

## 2020-05-22 MED ORDER — SODIUM CHLORIDE 0.9 % IV SOLN: Freq: Once | INTRAVENOUS | Status: AC

## 2020-05-22 NOTE — Progress Notes (Signed)
Patient was assessed by Dr. Katragadda and labs have been reviewed.  Patient is okay to proceed with treatment today. Primary RN and pharmacy aware.   

## 2020-05-22 NOTE — Patient Instructions (Signed)
[facility] Discharge Instructions for Patients Receiving Chemotherapy  Today you received the following chemotherapy agents   To help prevent nausea and vomiting after your treatment, we encourage you to take your nausea medication   If you develop nausea and vomiting that is not controlled by your nausea medication, call the clinic.   BELOW ARE SYMPTOMS THAT SHOULD BE REPORTED IMMEDIATELY:  *FEVER GREATER THAN 100.5 F  *CHILLS WITH OR WITHOUT FEVER  NAUSEA AND VOMITING THAT IS NOT CONTROLLED WITH YOUR NAUSEA MEDICATION  *UNUSUAL SHORTNESS OF BREATH  *UNUSUAL BRUISING OR BLEEDING  TENDERNESS IN MOUTH AND THROAT WITH OR WITHOUT PRESENCE OF ULCERS  *URINARY PROBLEMS  *BOWEL PROBLEMS  UNUSUAL RASH Items with * indicate a potential emergency and should be followed up as soon as possible.  Feel free to call the clinic should you have any questions or concerns. The clinic phone number is [phone].  Please show the CHEMO ALERT CARD at check-in to the Emergency Department and triage nurse.   

## 2020-05-22 NOTE — Patient Instructions (Signed)
[facility] at [facility] Discharge Instructions  You were seen today by Dr. Katragadda. He went over your recent results and scans. You received your treatment today. Dr. Katragadda will see you back in 3 weeks for labs and follow up.   Thank you for choosing [facility] at [facility] to provide your oncology and hematology care.  To afford each patient quality time with our provider, please arrive at least 15 minutes before your scheduled appointment time.   If you have a lab appointment with the Cancer Center please come in thru the Main Entrance and check in at the main information desk  You need to re-schedule your appointment should you arrive 10 or more minutes late.  We strive to give you quality time with our providers, and arriving late affects you and other patients whose appointments are after yours.  Also, if you no show three or more times for appointments you may be dismissed from the clinic at the providers discretion.     Again, thank you for choosing [facility].  Our hope is that these requests will decrease the amount of time that you wait before being seen by our physicians.       _____________________________________________________________  Should you have questions after your visit to [facility], please contact our office at [phone] between the hours of 8:00 a.m. and 4:30 p.m.  Voicemails left after 4:00 p.m. will not be returned until the following business day.  For prescription refill requests, have your pharmacy contact our office and allow 72 hours.    Cancer Center Support Programs:   > Cancer Support Group  2nd Tuesday of the month 1pm-2pm, Journey Room    

## 2020-05-22 NOTE — Progress Notes (Signed)
Labs reviewed with Dr Raliegh Ip, okay for treatment today.   Tolerated treatment well today without incidence.  Discharged ambulatory in stable condition. Vital signs stable prior to discharge.

## 2020-05-22 NOTE — Progress Notes (Signed)
[place], [place] [address]   CLINIC:  Medical Oncology/Hematology  PCP:  [name], MD [address] 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 Bulky 5.4 cm right supraclavicular region malignant lymph node conglomeration with extracapsular extension. 2. Surrounding smaller abnormal right level 3 and level 5 lymph nodes, and the lymphadenopathy continues into the superior mediastinum, see Chest CT findings reported separately. 3. No other metastatic disease identified in the neck.   01/16/2017 Imaging   CT chest: IMPRESSION: 1. Extensive lymphadenopathy in the thorax and lower right cervical region, as discussed above. Primary differential considerations include lymphoma/leukemia or small cell carcinoma of the lung. Further evaluation a PET-CT could be considered to assess for additional sites of disease below the diaphragm if clinically appropriate. Additionally, ultrasound-guided biopsy of supraclavicular lymphadenopathy could be considered to establish a tissue diagnosis. 2. Indeterminate lesion in the periphery of segment 8 of the liver measuring 2.7 x 1.7 cm. Attention at time of follow-up PET-CT is recommended. 3. Aortic atherosclerosis, in addition to left main and 3 vessel coronary artery disease. Please note that although the presence of coronary artery calcium documents the presence of coronary artery disease, the severity of this disease and any potential stenosis cannot be assessed on this non-gated CT examination. Assessment for potential risk factor modification,  dietary therapy or pharmacologic therapy may be warranted, if clinically indicated. 4. There are calcifications of the aortic valve. Echocardiographic correlation for evaluation of potential valvular dysfunction may be warranted if clinically indicated. 5. Diffuse bronchial wall thickening with moderate centrilobular and paraseptal emphysema; imaging findings suggestive of underlying COPD.   02/03/2017 Initial Biopsy   (R) neck lymph node biopsy: SMALL CELL CARCINOMA (most likely lung primary).    02/03/2017 Miscellaneous   Port-a-cath attempted by IR; unable to place d/t enlarged SVC.    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 69 Staging for metastatic disease  EXAM: MRI HEAD WITHOUT AND WITH CONTRAST  TECHNIQUE: Multiplanar, multiecho pulse sequences of the brain and surrounding structures were obtained without and with intravenous contrast.  CONTRAST:  78mL MULTIHANCE GADOBENATE DIMEGLUMINE 529 MG/ML IV SOLN  COMPARISON:  69  FINDINGS: Brain: Negative for hydrocephalus. Cerebral volume normal for 69 Small nonenhancing white matter hyperintensities consistent with mild chronic microvascular ischemia. No acute infarct. Negative for hemorrhage or mass or edema  Normal enhancement postcontrast infusion. No enhancing mass lesion. Leptomeningeal enhancement is normal.  Vascular: Normal arterial flow voids.  Normal venous enhancement  Skull and upper cervical spine: Negative  Sinuses/Orbits: Negative  Other: None  IMPRESSION: Negative for metastatic disease.  No acute abnormality.  Mild chronic white matter changes.   04/07/2017 Imaging    PET:  1. Marked reduction in size and metabolic activity of bulky RIGHT supraclavicular adenopathy  mediastinal lymphadenopathy. 2. Residual moderate activity remains within small RIGHT supraclavicular lymph node, RIGHT lower paratracheal lymph node and RIGHT  hilar lymph node. 3. Resolution of prevascular and internal mammary mediastinal metastatic hypermetabolic activity. 4. Resolution of metabolic activity associated with solitary RIGHT hepatic lobe liver metastasis. 5. No evidence of disease progression. 6. 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   05/27/2017 Imaging   MRI brain w/ and w/o contrast IMPRESSION: 1. No metastatic disease identified. 2. Increased nonspecific cerebral white matter signal changes since August. These are most commonly small vessel disease related. 3. New right maxillary sinusitis. Benign appearing retention cysts in the nasopharynx with trace mastoid effusions.   06/12/2017 Imaging   PET-CT IMPRESSION: 1. There are two new hypermetabolic nodules identified within both lower lobes measuring up to 3.1 cm. The appearance is nonspecific and may be inflammatory/infectious in etiology. Pulmonary metastatic disease cannot be excluded and short-term follow-up imaging in 3 months is advised to reassess these nodules. 2. Stable appearance of mild hypermetabolic activity associated with right paratracheal and right hilar lymph nodes. 3. Decrease in FDG uptake associated with index right supraclavicular lymph node. 4. 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 5. Aortic Atherosclerosis (ICD10-I70.0) and Emphysema (ICD10-J43.9).   08/24/2017 - 11/18/2019 Chemotherapy   The patient had pegfilgrastim (NEULASTA ONPRO KIT) injection 6 mg, 6 mg, Subcutaneous, Once, 30 of 31 cycles Administration: 6 mg (08/28/2017), 6 mg (09/18/2017), 6 mg (10/09/2017), 6 mg (11/02/2017), 6 mg  (11/20/2017), 6 mg (12/18/2017), 6 mg (01/15/2018), 6 mg (02/12/2018), 6 mg (03/12/2018), 6 mg (04/09/2018), 6 mg (05/07/2018), 6 mg (06/04/2018), 6 mg (07/02/2018), 6 mg (07/30/2018), 6 mg (08/27/2018), 6 mg (09/24/2018), 6 mg (10/29/2018), 6 mg (11/26/2018), 6 mg (12/24/2018), 6 mg (01/21/2019), 6 mg (02/18/2019), 6 mg (03/18/2019), 6 mg (04/15/2019), 6 mg (05/13/2019), 6 mg (06/17/2019), 6 mg (07/22/2019), 6 mg (08/19/2019), 6 mg (09/16/2019), 6 mg (10/21/2019), 6 mg (11/18/2019) topotecan (HYCAMTIN) 2.9 mg in sodium chloride 0.9 % 100 mL chemo infusion, 1.5 mg/m2 = 2.9 mg, Intravenous,  Once, 30 of 31 cycles Dose modification: 1.2 mg/m2 (80 % of original dose 1.5 mg/m2, Cycle 4, Reason: Dose Not Tolerated) Administration: 2.9 mg (08/24/2017), 2.9 mg (08/25/2017), 2.9 mg (08/28/2017), 2.9 mg (08/26/2017), 2.9 mg (08/27/2017), 2.9 mg (09/14/2017), 2.9 mg (09/15/2017), 2.9 mg (09/16/2017), 2.9 mg (09/17/2017), 2.9 mg (09/18/2017), 2.9 mg (10/05/2017), 2.9 mg (10/06/2017), 2.9 mg (10/07/2017), 2.9 mg (10/08/2017), 2.9 mg (10/09/2017), 2.3 mg (10/26/2017), 2.3 mg (10/27/2017), 2.3 mg (10/28/2017), 2.3 mg (10/29/2017), 2.3 mg (11/02/2017), 2.3 mg (11/16/2017), 2.3 mg (11/17/2017), 2.3 mg (11/18/2017), 2.3 mg (11/19/2017), 2.3 mg (11/20/2017), 2.3 mg (12/14/2017), 2.3 mg (12/15/2017), 2.3 mg (12/16/2017), 2.3 mg (12/17/2017), 2.3 mg (12/18/2017), 2.3 mg (01/11/2018), 2.3 mg (01/12/2018), 2.3 mg (01/13/2018), 2.3 mg (01/15/2018), 2.3 mg (02/08/2018), 2.3 mg (02/09/2018), 2.3 mg (02/10/2018), 2.3 mg (02/11/2018), 2.3 mg (02/12/2018), 2.3 mg (03/08/2018), 2.3 mg (03/09/2018), 2.3 mg (03/10/2018), 2.3 mg (03/11/2018), 2.3 mg (03/12/2018), 2.2 mg (04/05/2018), 2.2 mg (04/06/2018), 2.2 mg (04/07/2018), 2.2 mg (04/08/2018), 2.2 mg (04/09/2018), 2.2 mg (05/03/2018), 2.2 mg (05/04/2018), 2.2 mg (05/05/2018), 2.2 mg (05/06/2018), 2.2 mg (05/07/2018), 2.2 mg (05/31/2018), 2.2 mg (06/01/2018), 2.2 mg (06/02/2018), 2.2 mg (06/03/2018), 2.2 mg (06/04/2018), 2.2 mg (06/28/2018), 2.2 mg (06/29/2018), 2.2 mg (06/30/2018), 2.2 mg  (07/01/2018), 2.2 mg (07/02/2018), 2.2 mg (07/26/2018), 2.2 mg (07/27/2018), 2.2 mg (07/28/2018), 2.2 mg (07/29/2018), 2.2 mg (07/30/2018), 2.2 mg (08/23/2018), 2.2 mg (08/24/2018), 2.2 mg (08/25/2018), 2.2 mg (08/26/2018), 2.2 mg (08/27/2018), 2.2  mg (09/20/2018), 2.2 mg (09/21/2018), 2.2 mg (09/22/2018), 2.2 mg (09/23/2018), 2.2 mg (09/24/2018), 2.2 mg (10/25/2018), 2.2 mg (10/26/2018), 2.2 mg (10/27/2018), 2.2 mg (10/28/2018), 2.2 mg (10/29/2018), 2.2 mg (11/22/2018), 2.2 mg (11/23/2018), 2.2 mg (11/24/2018), 2.2 mg (11/25/2018), 2.2 mg (11/26/2018), 2.2 mg (12/20/2018), 2.2 mg (12/21/2018), 2.2 mg (12/22/2018), 2.2 mg (12/23/2018), 2.2 mg (12/24/2018), 2.2 mg (01/17/2019), 2.2 mg (01/18/2019), 2.2 mg (01/19/2019), 2.2 mg (01/20/2019), 2.2 mg (01/21/2019), 2.2 mg (02/14/2019), 2.2 mg (02/15/2019), 2.2 mg (02/16/2019), 2.2 mg (02/17/2019), 2.2 mg (02/18/2019), 2.2 mg (03/14/2019), 2.2 mg (03/15/2019), 2.2 mg (03/16/2019), 2.2 mg (03/17/2019), 2.2 mg (03/18/2019), 2.2 mg (04/11/2019), 2.2 mg (04/12/2019), 2.2 mg (04/13/2019), 2.2 mg (04/14/2019), 2.2 mg (04/15/2019), 2.2 mg (05/09/2019), 2.2 mg (05/10/2019), 2.2 mg (05/11/2019), 2.2 mg (05/12/2019), 2.2 mg (05/13/2019), 2.2 mg (06/13/2019), 2.2 mg (06/14/2019), 2.2 mg (06/15/2019), 2.2 mg (06/16/2019), 2.2 mg (06/17/2019), 2.2 mg (07/18/2019), 2.2 mg (07/19/2019), 2.2 mg (07/20/2019), 2.2 mg (07/21/2019), 2.2 mg (07/22/2019), 2.2 mg (08/15/2019), 2.2 mg (08/16/2019), 2.2 mg (08/17/2019), 2.2 mg (08/18/2019), 2.2 mg (08/19/2019), 2.2 mg (09/12/2019), 2.2 mg (09/13/2019), 2.2 mg (09/14/2019), 2.2 mg (09/15/2019), 2.2 mg (09/16/2019), 2.2 mg (10/17/2019), 2.2 mg (10/18/2019), 2.2 mg (10/19/2019), 2.2 mg (10/20/2019), 2.2 mg (10/21/2019), 2.2 mg (11/14/2019), 2.2 mg (11/15/2019), 2.2 mg (11/16/2019), 2.2 mg (11/17/2019), 2.2 mg (11/18/2019) ondansetron (ZOFRAN) 4 mg in sodium chloride 0.9 % 50 mL IVPB, , Intravenous,  Once, 25 of 26 cycles Administration:  (02/08/2018),  (02/09/2018),  (02/10/2018),  (02/11/2018),  (02/12/2018),  (03/08/2018),  (03/09/2018),  (03/10/2018),  (03/11/2018),   (03/12/2018),  (04/05/2018),  (04/06/2018),  (04/07/2018),  (04/08/2018),  (04/09/2018),  (05/03/2018),  (05/04/2018),  (05/05/2018),  (05/06/2018),  (05/07/2018),  (05/31/2018),  (06/01/2018),  (06/02/2018),  (06/03/2018),  (06/04/2018),  (06/28/2018),  (06/29/2018),  (06/30/2018),  (07/01/2018),  (07/02/2018),  (07/26/2018),  (07/27/2018),  (07/28/2018),  (07/29/2018),  (07/30/2018),  (08/23/2018),  (08/24/2018),  (08/25/2018),  (08/26/2018),  (08/27/2018),  (09/20/2018),  (09/21/2018),  (09/22/2018),  (09/23/2018),  (09/24/2018),  (10/25/2018),  (10/26/2018),  (10/27/2018),  (10/28/2018),  (10/29/2018),  (11/22/2018),  (11/23/2018),  (11/24/2018),  (11/25/2018),  (11/26/2018),  (12/20/2018),  (12/21/2018),  (12/22/2018),  (12/23/2018),  (12/24/2018),  (01/17/2019),  (01/18/2019),  (01/19/2019),  (01/20/2019),  (01/21/2019),  (02/14/2019),  (02/15/2019),  (02/16/2019),  (02/17/2019),  (02/18/2019),  (03/14/2019),  (03/15/2019),  (03/16/2019),  (03/17/2019),  (03/18/2019),  (04/11/2019),  (04/12/2019),  (04/13/2019),  (04/14/2019),  (04/15/2019),  (05/09/2019),  (05/10/2019),  (05/11/2019),  (05/12/2019),  (05/13/2019),  (06/13/2019),  (06/14/2019),  (06/15/2019),  (06/16/2019),  (06/17/2019),  (07/18/2019),  (07/19/2019),  (07/20/2019),  (07/21/2019),  (07/22/2019),  (08/15/2019),  (08/16/2019),  (08/17/2019),  (08/18/2019),  (08/19/2019),  (09/12/2019),  (09/13/2019),  (09/14/2019),  (09/15/2019),  (09/16/2019),  (10/17/2019),  (10/18/2019),  (10/19/2019),  (10/20/2019),  (10/21/2019),  (11/14/2019),  (11/15/2019),  (11/16/2019),  (11/17/2019),  (11/18/2019)  for chemotherapy treatment.    12/22/2019 -  Chemotherapy   The patient had palonosetron (ALOXI) injection 0.25 mg, 0.25 mg, Intravenous,  Once, 7 of 8 cycles Administration: 0.25 mg (12/22/2019), 0.25 mg (01/13/2020), 0.25 mg (02/06/2020), 0.25 mg (02/27/2020), 0.25 mg (03/20/2020), 0.25 mg (04/10/2020), 0.25 mg (05/01/2020) lurbinectedin (ZEPZELCA) 5.8 mg in sodium chloride 0.9 % 250 mL chemo infusion, 3.2 mg/m2 = 5.8 mg, Intravenous,  Once, 7 of 8 cycles Dose modification: 2.6  mg/m2 (original dose 3.2 mg/m2, Cycle 5, Reason: Other (see comments), Comment: weakness) Administration: 5.8 mg (12/22/2019), 5.8 mg (01/13/2020), 5.8 mg (02/06/2020), 5.8 mg (02/27/2020), 4.75 mg (03/20/2020), 4.75 mg (04/10/2020), 4.75 mg (05/01/2020)  for chemotherapy treatment.  CANCER STAGING: Cancer Staging No matching staging information was found for the patient.  INTERVAL HISTORY:  Mr. KOLESON REIFSTECK, a 69 y.o. male, returns for routine follow-up and consideration for next cycle of chemotherapy. Beniah was last seen on 05/01/2020.  Due for cycle #8 of lurbinectedin today.   Overall, he tells me he has been feeling pretty well. He tolerated the previous treatment well and reports having some fatigue during the day and sleep disturbance. He reports having issues with smelling and has to blow his nose 4-5 times every morning to clear them out ever since he got the crack on his skull. He reports getting an intermittent minor headache under his eyebrow lasting 2-3 minutes, but denies vision changes. His back pain is stable and controlled with 1.5 tablets of Percocet. His appetite is not good and he is forcing himself to eat and drinks [employer] along with plant-based protein shakes.  Overall, he feels ready for next cycle of chemo today.    REVIEW OF SYSTEMS:  Review of Systems  Constitutional: Positive for appetite change (30%) and fatigue (70%).  HENT:         Decreased smelling  Eyes: Negative for eye problems.  Gastrointestinal: Positive for diarrhea.  Neurological: Positive for dizziness, headaches (intermittent minor) and numbness (feet).  Psychiatric/Behavioral: Positive for sleep disturbance.  All other systems reviewed and are negative.   PAST MEDICAL/SURGICAL HISTORY:  Past Medical History:  Diagnosis Date  . Anxiety   . CAD (coronary artery disease)   . Cancer (HCC)    stage 4 small cell lung cancer   . COPD (chronic obstructive pulmonary disease)  (HCC)   . Depression   . Dyspnea    increased exertion  . Feeling of chest tightness   . Heart palpitations   . History of chemotherapy   . Myocardial infarction (HCC)   . Osteopenia   . Panic attacks   . Smoker    Past Surgical History:  Procedure Laterality Date  . BACK SURGERY  12/24/2000   L5,S1  . CORONARY STENT PLACEMENT  2005   RCA & CX  . HERNIA REPAIR Right 1980's  . INGUINAL HERNIA REPAIR  12/1978   right side  . IR FLUORO GUIDE PORT INSERTION RIGHT  04/02/2017  . IR US GUIDE BX ASP/DRAIN  02/03/2017  . IR US GUIDE VASC ACCESS RIGHT  04/02/2017  . NM MYOCAR PERF WALL MOTION  09/07/2009   No ischemia; EF 51%  . SHOULDER SURGERY Left 08/2010  . SPINE SURGERY  2002   L5-S1    SOCIAL HISTORY:  Social History   Socioeconomic History  . Marital status: Single    Spouse name: Not on file  . Number of children: Not on file  . Years of education: Not on file  . Highest education level: Not on file  Occupational History  . Not on file  Tobacco Use  . Smoking status: Current Every Day Smoker    Packs/day: 1.00    Types: Cigarettes  . Smokeless tobacco: Never Used  Vaping Use  . Vaping Use: Never used  Substance and Sexual Activity  . Alcohol use: Yes    Comment: occas  . Drug use: No  . Sexual activity: Not on file  Other Topics Concern  . Not on file  Social History Narrative  . Not on file   Social Determinants of Health   Financial Resource Strain: Low Risk   . Difficulty of Paying Living Expenses: Not  hard at all  Food Insecurity: No Food Insecurity  . Worried About Charity fundraiser in the Last Year: Never true  . Ran Out of Food in the Last Year: Never true  Transportation Needs: No Transportation Needs  . Lack of Transportation (Medical): No  . Lack of Transportation (Non-Medical): No  Physical Activity: Inactive  . Days of Exercise per Week: 0 days  . Minutes of Exercise per Session: 0 min  Stress: No Stress Concern Present  . Feeling of  Stress : Not at all  Social Connections: Socially Isolated  . Frequency of Communication with Friends and Family: More than three times a week  . Frequency of Social Gatherings with Friends and Family: More than three times a week  . Attends Religious Services: Never  . Active Member of Clubs or Organizations: No  . Attends Archivist Meetings: Never  . Marital Status: Never married  Intimate Partner Violence: Not At Risk  . Fear of Current or Ex-Partner: No  . Emotionally Abused: No  . Physically Abused: No  . Sexually Abused: No    FAMILY HISTORY:  Family History  Problem Relation Age of Onset  . Heart attack Father   . Kidney disease Father        renal failure  . Heart failure Mother   . Heart attack Mother   . Cancer Brother   . Diabetes Brother   . Alcohol abuse Brother   . Diabetes Sister     CURRENT MEDICATIONS:  Current Outpatient Medications  Medication Sig Dispense Refill  . albuterol (VENTOLIN HFA) 108 (90 Base) MCG/ACT inhaler Inhale 2 puffs into the lungs every 6 (six) hours as needed for wheezing or shortness of breath.     . ALPRAZolam (XANAX) 1 MG tablet TAKE (1) TABLET BY MOUTH (4) TIMES DAILY. 120 tablet 3  . Ascorbic Acid (VITAMIN C) 1000 MG tablet Take 1,000 mg by mouth daily.    Marland Kitchen aspirin EC 81 MG tablet Take 81 mg by mouth daily.    Marland Kitchen atorvastatin (LIPITOR) 40 MG tablet TAKE (1) TABLET BY MOUTH ONCE DAILY. 90 tablet 1  . calcium-vitamin D (OSCAL WITH D) 250-125 MG-UNIT tablet Take 1 tablet by mouth daily.    . cholecalciferol (VITAMIN D3) 25 MCG (1000 UT) tablet Take 1,000 Units by mouth daily.    . clotrimazole-betamethasone (LOTRISONE) cream Apply 1 application topically 2 (two) times daily. 30 g 0  . docusate sodium (COLACE) 100 MG capsule Take two capsules at bedtime starting the day you receive chemotherapy and then for four days after 30 capsule 3  . gabapentin (NEURONTIN) 300 MG capsule TAKE (2) CAPSULES BY MOUTH THREE TIMES DAILY. 180  capsule 6  . hydrOXYzine (ATARAX/VISTARIL) 50 MG tablet TAKE ONE TABLET BY MOUTH AT BEDTIME AS NEEDED FOR SLEEP. 30 tablet 0  . Multiple Vitamins-Minerals (CENTRUM SILVER 50+MEN) TABS Take 1 tablet by mouth daily.    Marland Kitchen oxyCODONE-acetaminophen (PERCOCET/ROXICET) 5-325 MG tablet Take 1 tablet by mouth 2 (two) times daily as needed for severe pain. 90 tablet 0  . Pegfilgrastim (NEULASTA ONPRO [place]) Inject into the skin. Every 21 days    . senna (SENOKOT) 8.6 MG tablet Take 3 tablets by mouth daily.     . topotecan in sodium chloride 0.9 % 100 mL Inject into the vein once. Days 1-5 every 21 days    . vitamin B-12 (CYANOCOBALAMIN) 100 MCG tablet Take 100 mcg by mouth daily.    . ondansetron (ZOFRAN)  8 MG tablet Take 1 tablet (8 mg total) by mouth 2 (two) times daily as needed. Start on the third day after cisplatin chemotherapy. (Patient not taking: Reported on 05/22/2020) 30 tablet 1  . prochlorperazine (COMPAZINE) 10 MG tablet Take 1 tablet (10 mg total) by mouth every 6 (six) hours as needed (Nausea or vomiting). (Patient not taking: Reported on 05/22/2020) 60 tablet 1   No current facility-administered medications for this visit.    ALLERGIES:  Allergies  Allergen Reactions  . Codeine Nausea Only  . Niaspan [Niacin Er]     PHYSICAL EXAM:  Performance status (ECOG): 1 - Symptomatic but completely ambulatory  Vitals:   05/22/20 0913  BP: 138/66  Pulse: (!) 55  Resp: 18  Temp: (!) 96.8 F (36 C)  SpO2: 95%   Wt Readings from Last 3 Encounters:  05/22/20 146 lb 11.2 oz (66.5 kg)  05/01/20 148 lb (67.1 kg)  04/10/20 147 lb 12.8 oz (67 kg)   Physical Exam Vitals reviewed.  Constitutional:      Appearance: Normal appearance.  Cardiovascular:     Rate and Rhythm: Normal rate and regular rhythm.     Pulses: Normal pulses.     Heart sounds: Normal heart sounds.  Pulmonary:     Effort: Pulmonary effort is normal.     Breath sounds: Normal breath sounds.  Chest:     Comments:  Port-a-Cath in R chest Lymphadenopathy:     Cervical: No cervical adenopathy.     Upper Body:     Right upper body: No supraclavicular adenopathy.     Left upper body: No supraclavicular adenopathy.  Neurological:     General: No focal deficit present.     Mental Status: He is alert and oriented to person, place, and time.  Psychiatric:        Mood and Affect: Mood normal.        Behavior: Behavior normal.     LABORATORY DATA:  I have reviewed the labs as listed.  CBC Latest Ref Rng & Units 05/22/2020 05/01/2020 04/10/2020  WBC 4.0 - 10.5 K/uL 4.0 4.6 4.3  Hemoglobin 13.0 - 17.0 g/dL 10.7(L) 10.3(L) 10.2(L)  Hematocrit 39 - 52 % 33.3(L) 31.7(L) 31.8(L)  Platelets 150 - 400 K/uL 250 193 254   CMP Latest Ref Rng & Units 05/22/2020 05/01/2020 04/10/2020  Glucose 70 - 99 mg/dL 97 106(H) 101(H)  BUN 8 - 23 mg/dL $Remove'13 19 22  'pfXLIFr$ Creatinine 0.61 - 1.24 mg/dL 1.00 0.91 0.98  Sodium 135 - 145 mmol/L 136 139 137  Potassium 3.5 - 5.1 mmol/L 4.4 4.0 4.1  Chloride 98 - 111 mmol/L 101 104 106  CO2 22 - 32 mmol/L $RemoveB'26 25 24  'iIriNPcD$ Calcium 8.9 - 10.3 mg/dL 9.0 8.7(L) 8.7(L)  Total Protein 6.5 - 8.1 g/dL 6.8 6.7 6.6  Total Bilirubin 0.3 - 1.2 mg/dL 0.5 0.6 0.5  Alkaline Phos 38 - 126 U/L 55 52 54  AST 15 - 41 U/L $Remo'17 16 16  'htnFS$ ALT 0 - 44 U/L $Remo'14 13 14   'SkVFU$ Lab Results  Component Value Date   LDH 129 05/22/2020   LDH 134 05/01/2020   LDH 135 03/20/2020    DIAGNOSTIC IMAGING:  I have independently reviewed the scans and discussed with the patient. 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 Small cell lung cancer, assess treatment response. Additional history provided: Status post chemotherapy, consideration for next cycle  of chemotherapy. EXAM: CT NECK WITH CONTRAST TECHNIQUE: Multidetector CT imaging of the neck was performed using the standard protocol following the bolus administration of intravenous contrast. CONTRAST:  111mL  OMNIPAQUE IOHEXOL 300 MG/ML  SOLN COMPARISON:  Prior neck CT examinations 02/23/2020 and 12/15/2019. FINDINGS: Pharynx and larynx: Again demonstrated is an ovoid hypodense lesion within the left nasopharyngeal region demonstrating subtle peripheral enhancement. As before, the lesion measures 2.1 x 1.4 cm in transaxial dimensions (series 2, image 15). This is again favored to reflect a necrotic (or two adjacent necrotic) retropharyngeal lymph nodes (nodes of [place]). Otherwise, there is no swelling or discrete mass within the oral cavity, pharynx or larynx. Salivary glands: No inflammation, mass, or stone. Thyroid: Unremarkable. Lymph nodes: No pathologically enlarged lymph nodes are identified elsewhere within the neck. Vascular: The major vascular structures of the neck are patent. Calcified atherosclerotic plaque within the carotid bifurcations and proximal ICAs. Limited intracranial: No acute intracranial abnormality identified. Visualized orbits: Excluded from the field of view. Mastoids and visualized paranasal sinuses: No significant paranasal sinus disease or mastoid effusion at the imaged levels. Skeleton: No acute bony abnormality or aggressive osseous lesion. Cervical spondylosis without high-grade bony spinal canal stenosis. Upper chest: Reported separately on concurrently performed chest CT 05/17/2020. IMPRESSION: Stable examination as compared to the neck CT of 02/23/2020. Unchanged size and appearance of a 2.1 x 1.4 cm peripherally enhancing hypodense lesion within the left nasopharyngeal region. This is again favored to reflect a necrotic (or two adjacent necrotic) retropharyngeal lymph nodes, rather than a primary nasopharyngeal mass. No pathologically enlarged lymph nodes elsewhere within the neck. Please refer to the separately reported chest CT for a description of intrathoracic findings. Electronically Signed   By: Kellie Simmering DO   On: 05/17/2020 17:48   CT Chest W Contrast  Result Date:  05/17/2020 CLINICAL DATA:  Small cell lung cancer, assess treatment response EXAM: CT CHEST, ABDOMEN, AND PELVIS WITH CONTRAST TECHNIQUE: Multidetector CT imaging of the chest, abdomen and pelvis was performed following the standard protocol during bolus administration of intravenous contrast. CONTRAST:  126mL OMNIPAQUE IOHEXOL 300 MG/ML  SOLN COMPARISON:  February 23, 2020 FINDINGS: CT CHEST FINDINGS Cardiovascular: Calcified and noncalcified atheromatous plaque in the thoracic aorta. Heart size is normal. RIGHT-sided Port-A-Cath terminates at the caval to atrial junction. Central pulmonary vasculature is normal. Mediastinum/Nodes: Esophagus unremarkable. No thoracic inlet adenopathy. No axillary lymphadenopathy. No hilar adenopathy. No mediastinal adenopathy. Lungs/Pleura: Severe pulmonary emphysema worse at the lung apices. Airways are patent. Linear volume loss or scarring developing in the lingula. Shows no nodular features. No pleural effusion. Irregular nodular foci in the chest with no change, approximately 1 cm area in the LEFT lower lobe (image 132, series 3) 0.6 cm area in the LEFT lower lobe (image 129, series 3) areas are unchanged. Irregular nodular density in the RIGHT lower lobe (image 130, series 3) approximately 1.6 cm also stable. No new pulmonary nodules. Musculoskeletal: See below for full musculoskeletal details. CT ABDOMEN PELVIS FINDINGS Hepatobiliary: No focal, suspicious hepatic lesion. The portal vein is patent. No pericholecystic stranding. Gallbladder is decompressed. No biliary duct dilation. Pancreas: Normal Spleen: Normal Adrenals/Urinary Tract: Adrenal glands are normal. Low-density lesions in the kidneys compatible with cysts unchanged. No hydronephrosis. Urinary bladder under distended limiting assessment. Stomach/Bowel: No acute gastrointestinal process. Appendix is normal. Vascular/Lymphatic: Calcified and noncalcified atheromatous plaque in the abdominal aorta. No aneurysmal  dilation. There is no gastrohepatic or hepatoduodenal ligament lymphadenopathy. No retroperitoneal or mesenteric lymphadenopathy. No pelvic sidewall  lymphadenopathy. Reproductive: Prostate unremarkable by CT. Other: No ascites. Musculoskeletal: No acute bone finding. No destructive bone process. Spinal degenerative changes. IMPRESSION: 1. No evidence of recurrent or metastatic disease. 2. Stable irregular pulmonary nodules bilaterally. 3. Emphysema and aortic atherosclerosis. Aortic Atherosclerosis (ICD10-I70.0) and Emphysema (ICD10-J43.9). Electronically Signed   By: Zetta Bills M.D.   On: 05/17/2020 15:58   CT Abdomen W Contrast  Result Date: 05/17/2020 CLINICAL DATA:  Small cell lung cancer, assess treatment response EXAM: CT CHEST, ABDOMEN, AND PELVIS WITH CONTRAST TECHNIQUE: Multidetector CT imaging of the chest, abdomen and pelvis was performed following the standard protocol during bolus administration of intravenous contrast. CONTRAST:  137mL OMNIPAQUE IOHEXOL 300 MG/ML  SOLN COMPARISON:  February 23, 2020 FINDINGS: CT CHEST FINDINGS Cardiovascular: Calcified and noncalcified atheromatous plaque in the thoracic aorta. Heart size is normal. RIGHT-sided Port-A-Cath terminates at the caval to atrial junction. Central pulmonary vasculature is normal. Mediastinum/Nodes: Esophagus unremarkable. No thoracic inlet adenopathy. No axillary lymphadenopathy. No hilar adenopathy. No mediastinal adenopathy. Lungs/Pleura: Severe pulmonary emphysema worse at the lung apices. Airways are patent. Linear volume loss or scarring developing in the lingula. Shows no nodular features. No pleural effusion. Irregular nodular foci in the chest with no change, approximately 1 cm area in the LEFT lower lobe (image 132, series 3) 0.6 cm area in the LEFT lower lobe (image 129, series 3) areas are unchanged. Irregular nodular density in the RIGHT lower lobe (image 130, series 3) approximately 1.6 cm also stable. No new pulmonary  nodules. Musculoskeletal: See below for full musculoskeletal details. CT ABDOMEN PELVIS FINDINGS Hepatobiliary: No focal, suspicious hepatic lesion. The portal vein is patent. No pericholecystic stranding. Gallbladder is decompressed. No biliary duct dilation. Pancreas: Normal Spleen: Normal Adrenals/Urinary Tract: Adrenal glands are normal. Low-density lesions in the kidneys compatible with cysts unchanged. No hydronephrosis. Urinary bladder under distended limiting assessment. Stomach/Bowel: No acute gastrointestinal process. Appendix is normal. Vascular/Lymphatic: Calcified and noncalcified atheromatous plaque in the abdominal aorta. No aneurysmal dilation. There is no gastrohepatic or hepatoduodenal ligament lymphadenopathy. No retroperitoneal or mesenteric lymphadenopathy. No pelvic sidewall lymphadenopathy. Reproductive: Prostate unremarkable by CT. Other: No ascites. Musculoskeletal: No acute bone finding. No destructive bone process. Spinal degenerative changes. IMPRESSION: 1. No evidence of recurrent or metastatic disease. 2. Stable irregular pulmonary nodules bilaterally. 3. Emphysema and aortic atherosclerosis. Aortic Atherosclerosis (ICD10-I70.0) and Emphysema (ICD10-J43.9). Electronically Signed   By: Zetta Bills M.D.   On: 05/17/2020 15:58     ASSESSMENT:  1. 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 -CT CAP on 12/15/2019 showed enlarging prevascular and right paratracheal lymph nodes. Stable left lower lobe lung nodules. -CT neck on 12/15/2019 showed malignant lymphadenopathy in the lower right posterior triangle and jugular chain. -Lurbinectidin 3.2mg /kgstarted on 12/22/2019. -CT neck on 02/23/2020 showed marked reduction of lower right neck and right supraclavicular adenopathy. -CT CAP on 02/23/2020 showed treatment response with resolved mediastinal adenopathy. No evidence of new or progressive metastatic disease. Scattered  bilateral pulmonary nodules. -CT CAP from 05/17/2020 with no evidence of recurrence or metastatic disease.  Stable irregular pulmonary nodules bilaterally. -CT neck from 05/17/2020 showed unchanged 2.1 x 1.4 cm peripherally enhancing hypodense lesion within the left nasopharyngeal region, favored to be a necrotic lymph node.  No pathologically enlarged lymph nodes in the neck.   PLAN:  1. 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  Overall he is continuing to have  great response. -We have reviewed his labs.  LFTs are normal.  White count and platelets are adequate. -We will continue Lurbi at 2.6 mg/kg. -Reevaluate in 3 weeks.  2. Normocytic anemia: -Hemoglobin 10.7.  No iron infusion needed.  3. Chronic pain: -Continue Percocet 1 and half tablets twice daily.  Pain is adequately controlled.  4. Peripheral neuropathy: -Continue gabapentin 600 mg 3 times a day.  5. Sleeping difficulty: -Continue Vistaril as needed.   Orders placed this encounter:  No orders of the defined types were placed in this encounter.    Derek Jack, MD [place] [phone]   I, Milinda Antis, am acting as a scribe for Dr. Sanda Linger.  I, Derek Jack MD, have reviewed the above documentation for accuracy and completeness, and I agree with the above.

## 2020-05-29 ENCOUNTER — Other Ambulatory Visit (HOSPITAL_COMMUNITY): Payer: Self-pay | Admitting: Hematology

## 2020-05-29 DIAGNOSIS — C3490 Malignant neoplasm of unspecified part of unspecified bronchus or lung: Secondary | ICD-10-CM

## 2020-06-13 ENCOUNTER — Other Ambulatory Visit: Payer: Self-pay

## 2020-06-13 ENCOUNTER — Inpatient Hospital Stay (HOSPITAL_COMMUNITY): Payer: Medicare HMO

## 2020-06-13 ENCOUNTER — Inpatient Hospital Stay (HOSPITAL_COMMUNITY): Payer: Medicare HMO | Admitting: Hematology

## 2020-06-13 ENCOUNTER — Inpatient Hospital Stay (HOSPITAL_COMMUNITY): Payer: Medicare HMO | Attending: Hematology

## 2020-06-13 VITALS — BP 144/68 | HR 65 | Temp 97.5°F | Resp 16

## 2020-06-13 VITALS — BP 115/49 | HR 63 | Temp 97.0°F | Resp 16 | Wt 148.0 lb

## 2020-06-13 DIAGNOSIS — Z79899 Other long term (current) drug therapy: Secondary | ICD-10-CM | POA: Diagnosis not present

## 2020-06-13 DIAGNOSIS — C787 Secondary malignant neoplasm of liver and intrahepatic bile duct: Secondary | ICD-10-CM | POA: Diagnosis not present

## 2020-06-13 DIAGNOSIS — F17210 Nicotine dependence, cigarettes, uncomplicated: Secondary | ICD-10-CM | POA: Insufficient documentation

## 2020-06-13 DIAGNOSIS — C3490 Malignant neoplasm of unspecified part of unspecified bronchus or lung: Secondary | ICD-10-CM | POA: Diagnosis not present

## 2020-06-13 DIAGNOSIS — C770 Secondary and unspecified malignant neoplasm of lymph nodes of head, face and neck: Secondary | ICD-10-CM | POA: Diagnosis not present

## 2020-06-13 DIAGNOSIS — Z5111 Encounter for antineoplastic chemotherapy: Secondary | ICD-10-CM | POA: Diagnosis not present

## 2020-06-13 LAB — COMPREHENSIVE METABOLIC PANEL
ALT: 16 U/L (ref 0–44)
AST: 17 U/L (ref 15–41)
Albumin: 3.9 g/dL (ref 3.5–5.0)
Alkaline Phosphatase: 49 U/L (ref 38–126)
Anion gap: 5 (ref 5–15)
BUN: 18 mg/dL (ref 8–23)
CO2: 25 mmol/L (ref 22–32)
Calcium: 9 mg/dL (ref 8.9–10.3)
Chloride: 104 mmol/L (ref 98–111)
Creatinine, Ser: 0.95 mg/dL (ref 0.61–1.24)
GFR, Estimated: >60 mL/min (ref >60)
Glucose, Bld: 100 mg/dL — ABNORMAL HIGH (ref 70–99)
Potassium: 4.2 mmol/L (ref 3.5–5.1)
Sodium: 134 mmol/L — ABNORMAL LOW (ref 135–145)
Total Bilirubin: 0.4 mg/dL (ref 0.3–1.2)
Total Protein: 6.8 g/dL (ref 6.5–8.1)

## 2020-06-13 LAB — CBC WITH DIFFERENTIAL/PLATELET
Abs Immature Granulocytes: 0.02 10*3/uL (ref 0.00–0.07)
Basophils Absolute: 0 10*3/uL (ref 0.0–0.1)
Basophils Relative: 1 %
Eosinophils Absolute: 0.1 10*3/uL (ref 0.0–0.5)
Eosinophils Relative: 2 %
HCT: 33 % — ABNORMAL LOW (ref 39.0–52.0)
Hemoglobin: 10.7 g/dL — ABNORMAL LOW (ref 13.0–17.0)
Immature Granulocytes: 0 %
Lymphocytes Relative: 19 %
Lymphs Abs: 1 10*3/uL (ref 0.7–4.0)
MCH: 32.2 pg (ref 26.0–34.0)
MCHC: 32.4 g/dL (ref 30.0–36.0)
MCV: 99.4 fL (ref 80.0–100.0)
Monocytes Absolute: 0.6 10*3/uL (ref 0.1–1.0)
Monocytes Relative: 11 %
Neutro Abs: 3.5 10*3/uL (ref 1.7–7.7)
Neutrophils Relative %: 67 %
Platelets: 211 10*3/uL (ref 150–400)
RBC: 3.32 MIL/uL — ABNORMAL LOW (ref 4.22–5.81)
RDW: 19.6 % — ABNORMAL HIGH (ref 11.5–15.5)
WBC: 5.3 10*3/uL (ref 4.0–10.5)
nRBC: 0 % (ref 0.0–0.2)

## 2020-06-13 LAB — MAGNESIUM: Magnesium: 2 mg/dL (ref 1.7–2.4)

## 2020-06-13 MED ORDER — SODIUM CHLORIDE 0.9 % IV SOLN: Freq: Once | INTRAVENOUS | Status: AC

## 2020-06-13 MED ORDER — PALONOSETRON HCL INJECTION 0.25 MG/5ML
0.2500 mg | Freq: Once | INTRAVENOUS | Status: AC
  Administered 2020-06-13: 0.25 mg via INTRAVENOUS
  Filled 2020-06-13: qty 5

## 2020-06-13 MED ORDER — SODIUM CHLORIDE 0.9 % IV SOLN
2.6000 mg/m2 | Freq: Once | INTRAVENOUS | Status: AC
  Administered 2020-06-13: 4.75 mg via INTRAVENOUS
  Filled 2020-06-13: qty 9.5

## 2020-06-13 MED ORDER — HEPARIN SOD (PORK) LOCK FLUSH 100 UNIT/ML IV SOLN
500.0000 [IU] | Freq: Once | INTRAVENOUS | Status: AC | PRN
  Administered 2020-06-13: 500 [IU]

## 2020-06-13 MED ORDER — SODIUM CHLORIDE 0.9 % IV SOLN
10.0000 mg | Freq: Once | INTRAVENOUS | Status: AC
  Administered 2020-06-13: 10 mg via INTRAVENOUS
  Filled 2020-06-13: qty 10

## 2020-06-13 MED ORDER — SODIUM CHLORIDE 0.9% FLUSH
10.0000 mL | INTRAVENOUS | Status: DC | PRN
  Administered 2020-06-13: 10 mL

## 2020-06-13 NOTE — Progress Notes (Signed)
Patient tolerated chemotherapy with no complaints voiced.  Side effects with management reviewed with understanding verbalized.  Port site clean and dry with no bruising or swelling noted at site.  Good blood return noted before and after administration of chemotherapy.  Band aid applied.  Patient left in satisfactory condition with VSS and no s/s of distress noted.   

## 2020-06-13 NOTE — Progress Notes (Signed)
Patient was assessed by Dr. Katragadda and labs have been reviewed.  Patient is okay to proceed with treatment today. Primary RN and pharmacy aware.   

## 2020-06-13 NOTE — Patient Instructions (Signed)
[place] at [facility] Discharge Instructions  You were seen today by Dr. Delton Coombes. He went over your recent results. You received your treatment today; continue getting your treatment every 3 weeks. Dr. Delton Coombes will see you back in 6 weeks for labs and follow up.   Thank you for choosing [place] at [facility] to provide your oncology and hematology care.  To afford each patient quality time with our provider, please arrive at least 15 minutes before your scheduled appointment time.   If you have a lab appointment with the [facility] please come in thru the Main Entrance and check in at the main information desk  You need to re-schedule your appointment should you arrive 10 or more minutes late.  We strive to give you quality time with our providers, and arriving late affects you and other patients whose appointments are after yours.  Also, if you no show three or more times for appointments you may be dismissed from the clinic at the providers discretion.     Again, thank you for choosing [facility].  Our hope is that these requests will decrease the amount of time that you wait before being seen by our physicians.       _____________________________________________________________  Should you have questions after your visit to [facility], please contact our office at [phone] between the hours of 8:00 a.m. and 4:30 p.m.  Voicemails left after 4:00 p.m. will not be returned until the following business day.  For prescription refill requests, have your pharmacy contact our office and allow 72 hours.    Cancer Center Support Programs:   > Cancer Support Group  2nd Tuesday of the month 1pm-2pm, Journey Room

## 2020-06-13 NOTE — Progress Notes (Signed)
[name], [place] [id number]   CLINIC:  Medical Oncology/Hematology  PCP:  [name], MD [address] 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 Bulky 5.4 cm right supraclavicular region malignant lymph node conglomeration with extracapsular extension. 2. Surrounding smaller abnormal right level 3 and level 5 lymph nodes, and the lymphadenopathy continues into the superior mediastinum, see Chest CT findings reported separately. 3. No other metastatic disease identified in the neck.   01/16/2017 Imaging   CT chest: IMPRESSION: 1. Extensive lymphadenopathy in the thorax and lower right cervical region, as discussed above. Primary differential considerations include lymphoma/leukemia or small cell carcinoma of the lung. Further evaluation a PET-CT could be considered to assess for additional sites of disease below the diaphragm if clinically appropriate. Additionally, ultrasound-guided biopsy of supraclavicular lymphadenopathy could be considered to establish a tissue diagnosis. 2. Indeterminate lesion in the periphery of segment 8 of the liver measuring 2.7 x 1.7 cm. Attention at time of follow-up PET-CT is recommended. 3. Aortic atherosclerosis, in addition to left main and 3 vessel coronary artery disease. Please note that although the presence of coronary artery calcium documents the presence of coronary artery disease, the severity of this disease and any potential stenosis cannot be assessed on this non-gated CT examination. Assessment for potential risk factor modification,  dietary therapy or pharmacologic therapy may be warranted, if clinically indicated. 4. There are calcifications of the aortic valve. Echocardiographic correlation for evaluation of potential valvular dysfunction may be warranted if clinically indicated. 5. Diffuse bronchial wall thickening with moderate centrilobular and paraseptal emphysema; imaging findings suggestive of underlying COPD.   02/03/2017 Initial Biopsy   (R) neck lymph node biopsy: SMALL CELL CARCINOMA (most likely lung primary).    02/03/2017 Miscellaneous   Port-a-cath attempted by IR; unable to place d/t enlarged SVC.    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 Staging for metastatic disease  EXAM: MRI HEAD WITHOUT AND WITH CONTRAST  TECHNIQUE: Multiplanar, multiecho pulse sequences of the brain and surrounding structures were obtained without and with intravenous contrast.  CONTRAST:  69mL MULTIHANCE GADOBENATE DIMEGLUMINE 529 MG/ML IV SOLN  COMPARISON:  None.  FINDINGS: Brain: Negative for hydrocephalus. Cerebral volume normal for age. Small nonenhancing white matter hyperintensities consistent with mild chronic microvascular ischemia. No acute infarct. Negative for hemorrhage or mass or edema  Normal enhancement postcontrast infusion. No enhancing mass lesion. Leptomeningeal enhancement is normal.  Vascular: Normal arterial flow voids.  Normal venous enhancement  Skull and upper cervical spine: Negative  Sinuses/Orbits: Negative  Other: None  IMPRESSION: Negative for metastatic disease.  No acute abnormality.  Mild chronic white matter changes.   04/07/2017 Imaging    PET:  1. Marked reduction in size and metabolic activity of bulky RIGHT supraclavicular adenopathy  mediastinal lymphadenopathy. 2. Residual moderate activity remains within small RIGHT supraclavicular lymph node, RIGHT lower paratracheal lymph node and RIGHT  hilar lymph node. 3. Resolution of prevascular and internal mammary mediastinal metastatic hypermetabolic activity. 4. Resolution of metabolic activity associated with solitary RIGHT hepatic lobe liver metastasis. 5. No evidence of disease progression. 6. 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   05/27/2017 Imaging   MRI brain w/ and w/o contrast IMPRESSION: 1. No metastatic disease identified. 2. Increased nonspecific cerebral white matter signal changes since August. These are most commonly small vessel disease related. 3. New right maxillary sinusitis. Benign appearing retention cysts in the nasopharynx with trace mastoid effusions.   06/12/2017 Imaging   PET-CT IMPRESSION: 1. There are two new hypermetabolic nodules identified within both lower lobes measuring up to 3.1 cm. The appearance is nonspecific and may be inflammatory/infectious in etiology. Pulmonary metastatic disease cannot be excluded and short-term follow-up imaging in 3 months is advised to reassess these nodules. 2. Stable appearance of mild hypermetabolic activity associated with right paratracheal and right hilar lymph nodes. 3. Decrease in FDG uptake associated with index right supraclavicular lymph node. 4. 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 5. Aortic Atherosclerosis (ICD10-I70.0) and Emphysema (ICD10-J43.9).   08/24/2017 - 11/18/2019 Chemotherapy   The patient had pegfilgrastim (NEULASTA ONPRO KIT) injection 6 mg, 6 mg, Subcutaneous, Once, 30 of 31 cycles Administration: 6 mg (08/28/2017), 6 mg (09/18/2017), 6 mg (10/09/2017), 6 mg (11/02/2017), 6 mg  (11/20/2017), 6 mg (12/18/2017), 6 mg (01/15/2018), 6 mg (02/12/2018), 6 mg (03/12/2018), 6 mg (04/09/2018), 6 mg (05/07/2018), 6 mg (06/04/2018), 6 mg (07/02/2018), 6 mg (07/30/2018), 6 mg (08/27/2018), 6 mg (09/24/2018), 6 mg (10/29/2018), 6 mg (11/26/2018), 6 mg (12/24/2018), 6 mg (01/21/2019), 6 mg (02/18/2019), 6 mg (03/18/2019), 6 mg (04/15/2019), 6 mg (05/13/2019), 6 mg (06/17/2019), 6 mg (07/22/2019), 6 mg (08/19/2019), 6 mg (09/16/2019), 6 mg (10/21/2019), 6 mg (11/18/2019) topotecan (HYCAMTIN) 2.9 mg in sodium chloride 0.9 % 100 mL chemo infusion, 1.5 mg/m2 = 2.9 mg, Intravenous,  Once, 30 of 31 cycles Dose modification: 1.2 mg/m2 (80 % of original dose 1.5 mg/m2, Cycle 4, Reason: Dose Not Tolerated) Administration: 2.9 mg (08/24/2017), 2.9 mg (08/25/2017), 2.9 mg (08/28/2017), 2.9 mg (08/26/2017), 2.9 mg (08/27/2017), 2.9 mg (09/14/2017), 2.9 mg (09/15/2017), 2.9 mg (09/16/2017), 2.9 mg (09/17/2017), 2.9 mg (09/18/2017), 2.9 mg (10/05/2017), 2.9 mg (10/06/2017), 2.9 mg (10/07/2017), 2.9 mg (10/08/2017), 2.9 mg (10/09/2017), 2.3 mg (10/26/2017), 2.3 mg (10/27/2017), 2.3 mg (10/28/2017), 2.3 mg (10/29/2017), 2.3 mg (11/02/2017), 2.3 mg (11/16/2017), 2.3 mg (11/17/2017), 2.3 mg (11/18/2017), 2.3 mg (11/19/2017), 2.3 mg (11/20/2017), 2.3 mg (12/14/2017), 2.3 mg (12/15/2017), 2.3 mg (12/16/2017), 2.3 mg (12/17/2017), 2.3 mg (12/18/2017), 2.3 mg (01/11/2018), 2.3 mg (01/12/2018), 2.3 mg (01/13/2018), 2.3 mg (01/15/2018), 2.3 mg (02/08/2018), 2.3 mg (02/09/2018), 2.3 mg (02/10/2018), 2.3 mg (02/11/2018), 2.3 mg (02/12/2018), 2.3 mg (03/08/2018), 2.3 mg (03/09/2018), 2.3 mg (03/10/2018), 2.3 mg (03/11/2018), 2.3 mg (03/12/2018), 2.2 mg (04/05/2018), 2.2 mg (04/06/2018), 2.2 mg (04/07/2018), 2.2 mg (04/08/2018), 2.2 mg (04/09/2018), 2.2 mg (05/03/2018), 2.2 mg (05/04/2018), 2.2 mg (05/05/2018), 2.2 mg (05/06/2018), 2.2 mg (05/07/2018), 2.2 mg (05/31/2018), 2.2 mg (06/01/2018), 2.2 mg (06/02/2018), 2.2 mg (06/03/2018), 2.2 mg (06/04/2018), 2.2 mg (06/28/2018), 2.2 mg (06/29/2018), 2.2 mg (06/30/2018), 2.2 mg  (07/01/2018), 2.2 mg (07/02/2018), 2.2 mg (07/26/2018), 2.2 mg (07/27/2018), 2.2 mg (07/28/2018), 2.2 mg (07/29/2018), 2.2 mg (07/30/2018), 2.2 mg (08/23/2018), 2.2 mg (08/24/2018), 2.2 mg (08/25/2018), 2.2 mg (08/26/2018), 2.2 mg (08/27/2018), 2.2  mg (09/20/2018), 2.2 mg (09/21/2018), 2.2 mg (09/22/2018), 2.2 mg (09/23/2018), 2.2 mg (09/24/2018), 2.2 mg (10/25/2018), 2.2 mg (10/26/2018), 2.2 mg (10/27/2018), 2.2 mg (10/28/2018), 2.2 mg (10/29/2018), 2.2 mg (11/22/2018), 2.2 mg (11/23/2018), 2.2 mg (11/24/2018), 2.2 mg (11/25/2018), 2.2 mg (11/26/2018), 2.2 mg (12/20/2018), 2.2 mg (12/21/2018), 2.2 mg (12/22/2018), 2.2 mg (12/23/2018), 2.2 mg (12/24/2018), 2.2 mg (01/17/2019), 2.2 mg (01/18/2019), 2.2 mg (01/19/2019), 2.2 mg (01/20/2019), 2.2 mg (01/21/2019), 2.2 mg (02/14/2019), 2.2 mg (02/15/2019), 2.2 mg (02/16/2019), 2.2 mg (02/17/2019), 2.2 mg (02/18/2019), 2.2 mg (03/14/2019), 2.2 mg (03/15/2019), 2.2 mg (03/16/2019), 2.2 mg (03/17/2019), 2.2 mg (03/18/2019), 2.2 mg (04/11/2019), 2.2 mg (04/12/2019), 2.2 mg (04/13/2019), 2.2 mg (04/14/2019), 2.2 mg (04/15/2019), 2.2 mg (05/09/2019), 2.2 mg (05/10/2019), 2.2 mg (05/11/2019), 2.2 mg (05/12/2019), 2.2 mg (05/13/2019), 2.2 mg (06/13/2019), 2.2 mg (06/14/2019), 2.2 mg (06/15/2019), 2.2 mg (06/16/2019), 2.2 mg (06/17/2019), 2.2 mg (07/18/2019), 2.2 mg (07/19/2019), 2.2 mg (07/20/2019), 2.2 mg (07/21/2019), 2.2 mg (07/22/2019), 2.2 mg (08/15/2019), 2.2 mg (08/16/2019), 2.2 mg (08/17/2019), 2.2 mg (08/18/2019), 2.2 mg (08/19/2019), 2.2 mg (09/12/2019), 2.2 mg (09/13/2019), 2.2 mg (09/14/2019), 2.2 mg (09/15/2019), 2.2 mg (09/16/2019), 2.2 mg (10/17/2019), 2.2 mg (10/18/2019), 2.2 mg (10/19/2019), 2.2 mg (10/20/2019), 2.2 mg (10/21/2019), 2.2 mg (11/14/2019), 2.2 mg (11/15/2019), 2.2 mg (11/16/2019), 2.2 mg (11/17/2019), 2.2 mg (11/18/2019) ondansetron (ZOFRAN) 4 mg in sodium chloride 0.9 % 50 mL IVPB, , Intravenous,  Once, 25 of 26 cycles Administration:  (02/08/2018),  (02/09/2018),  (02/10/2018),  (02/11/2018),  (02/12/2018),  (03/08/2018),  (03/09/2018),  (03/10/2018),  (03/11/2018),   (03/12/2018),  (04/05/2018),  (04/06/2018),  (04/07/2018),  (04/08/2018),  (04/09/2018),  (05/03/2018),  (05/04/2018),  (05/05/2018),  (05/06/2018),  (05/07/2018),  (05/31/2018),  (06/01/2018),  (06/02/2018),  (06/03/2018),  (06/04/2018),  (06/28/2018),  (06/29/2018),  (06/30/2018),  (07/01/2018),  (07/02/2018),  (07/26/2018),  (07/27/2018),  (07/28/2018),  (07/29/2018),  (07/30/2018),  (08/23/2018),  (08/24/2018),  (08/25/2018),  (08/26/2018),  (08/27/2018),  (09/20/2018),  (09/21/2018),  (09/22/2018),  (09/23/2018),  (09/24/2018),  (10/25/2018),  (10/26/2018),  (10/27/2018),  (10/28/2018),  (10/29/2018),  (11/22/2018),  (11/23/2018),  (11/24/2018),  (11/25/2018),  (11/26/2018),  (12/20/2018),  (12/21/2018),  (12/22/2018),  (12/23/2018),  (12/24/2018),  (01/17/2019),  (01/18/2019),  (01/19/2019),  (01/20/2019),  (01/21/2019),  (02/14/2019),  (02/15/2019),  (02/16/2019),  (02/17/2019),  (02/18/2019),  (03/14/2019),  (03/15/2019),  (03/16/2019),  (03/17/2019),  (03/18/2019),  (04/11/2019),  (04/12/2019),  (04/13/2019),  (04/14/2019),  (04/15/2019),  (05/09/2019),  (05/10/2019),  (05/11/2019),  (05/12/2019),  (05/13/2019),  (06/13/2019),  (06/14/2019),  (06/15/2019),  (06/16/2019),  (06/17/2019),  (07/18/2019),  (07/19/2019),  (07/20/2019),  (07/21/2019),  (07/22/2019),  (08/15/2019),  (08/16/2019),  (08/17/2019),  (08/18/2019),  (08/19/2019),  (09/12/2019),  (09/13/2019),  (09/14/2019),  (09/15/2019),  (09/16/2019),  (10/17/2019),  (10/18/2019),  (10/19/2019),  (10/20/2019),  (10/21/2019),  (11/14/2019),  (11/15/2019),  (11/16/2019),  (11/17/2019),  (11/18/2019)  for chemotherapy treatment.    12/22/2019 -  Chemotherapy   The patient had palonosetron (ALOXI) injection 0.25 mg, 0.25 mg, Intravenous,  Once, 8 of 12 cycles Administration: 0.25 mg (12/22/2019), 0.25 mg (01/13/2020), 0.25 mg (02/06/2020), 0.25 mg (02/27/2020), 0.25 mg (03/20/2020), 0.25 mg (04/10/2020), 0.25 mg (05/01/2020), 0.25 mg (05/22/2020) lurbinectedin (ZEPZELCA) 5.8 mg in sodium chloride 0.9 % 250 mL chemo infusion, 3.2 mg/m2 = 5.8 mg, Intravenous,  Once, 8 of 12  cycles Dose modification: 2.6 mg/m2 (original dose 3.2 mg/m2, Cycle 5, Reason: Other (see comments), Comment: weakness) Administration: 5.8 mg (12/22/2019), 5.8 mg (01/13/2020), 5.8 mg (02/06/2020), 5.8 mg (02/27/2020), 4.75 mg (03/20/2020), 4.75 mg (04/10/2020), 4.75 mg (05/01/2020), 4.75 mg (05/22/2020)  for  chemotherapy treatment.      CANCER STAGING: Cancer Staging No matching staging information was found for the patient.  INTERVAL HISTORY:  Mr. MARCELLE BEBOUT, a 69 y.o. male, returns for routine follow-up and consideration for next cycle of chemotherapy. Burney was last seen on 05/22/2020.  Due for cycle #9 of lurbinectedin today.   Overall, he tells me he has been feeling okay. He reports feeling tired for 5 days after getting the treatments, but then his energy levels improve. He denies getting N/V/D after getting his treatment. He continues taking 1.5 tablets of Percocet BID. He continues having trouble falling asleep and reports that the hydroxyzine is not helping. His appetite is slowly improving, though it is still at 25%; he reports having abdominal pain if he stands up after eating.  Overall, he feels ready for next cycle of chemo today.    REVIEW OF SYSTEMS:  Review of Systems  Constitutional: Positive for appetite change (25%) and fatigue (75%).  Respiratory: Positive for wheezing (when reclining).   Gastrointestinal: Positive for abdominal pain (upon standing after eating). Negative for diarrhea, nausea and vomiting.  Genitourinary: Positive for bladder incontinence.   Neurological: Positive for dizziness and numbness (feet).  Psychiatric/Behavioral: Positive for sleep disturbance.  All other systems reviewed and are negative.   PAST MEDICAL/SURGICAL HISTORY:  Past Medical History:  Diagnosis Date  . Anxiety   . CAD (coronary artery disease)   . Cancer ([place])    stage 4 small cell lung cancer   . COPD (chronic obstructive pulmonary disease) ([place])   . Depression   .  Dyspnea    increased exertion  . Feeling of chest tightness   . Heart palpitations   . History of chemotherapy   . Myocardial infarction ([place])   . Osteopenia   . Panic attacks   . Smoker    Past Surgical History:  Procedure Laterality Date  . BACK SURGERY  12/24/2000   L5,S1  . CORONARY STENT PLACEMENT  2005   RCA & CX  . HERNIA REPAIR Right 1980's  . INGUINAL HERNIA REPAIR  12/1978   right side  . IR FLUORO GUIDE PORT INSERTION RIGHT  04/02/2017  . IR US GUIDE BX ASP/DRAIN  02/03/2017  . IR US GUIDE VASC ACCESS RIGHT  04/02/2017  . NM MYOCAR PERF WALL MOTION  09/07/2009   No ischemia; EF 51%  . SHOULDER SURGERY Left 08/2010  . SPINE SURGERY  2002   L5-S1    SOCIAL HISTORY:  Social History   Socioeconomic History  . Marital status: Single    Spouse name: Not on file  . Number of children: Not on file  . Years of education: Not on file  . Highest education level: Not on file  Occupational History  . Not on file  Tobacco Use  . Smoking status: Current Every Day Smoker    Packs/day: 1.00    Types: Cigarettes  . Smokeless tobacco: Never Used  Vaping Use  . Vaping Use: Never used  Substance and Sexual Activity  . Alcohol use: Yes    Comment: occas  . Drug use: No  . Sexual activity: Not on file  Other Topics Concern  . Not on file  Social History Narrative  . Not on file   Social Determinants of Health   Financial Resource Strain: Low Risk   . Difficulty of Paying Living Expenses: Not hard at all  Food Insecurity: No Food Insecurity  . Worried About Charity fundraiser  in the Last Year: Never true  . Ran Out of Food in the Last Year: Never true  Transportation Needs: No Transportation Needs  . Lack of Transportation (Medical): No  . Lack of Transportation (Non-Medical): No  Physical Activity: Inactive  . Days of Exercise per Week: 0 days  . Minutes of Exercise per Session: 0 min  Stress: No Stress Concern Present  . Feeling of Stress : Not at all  Social  Connections: Socially Isolated  . Frequency of Communication with Friends and Family: More than three times a week  . Frequency of Social Gatherings with Friends and Family: More than three times a week  . Attends Religious Services: Never  . Active Member of Clubs or Organizations: No  . Attends Banker Meetings: Never  . Marital Status: Never married  Intimate Partner Violence: Not At Risk  . Fear of Current or Ex-Partner: No  . Emotionally Abused: No  . Physically Abused: No  . Sexually Abused: No    FAMILY HISTORY:  Family History  Problem Relation Age of Onset  . Heart attack Father   . Kidney disease Father        renal failure  . Heart failure Mother   . Heart attack Mother   . Cancer Brother   . Diabetes Brother   . Alcohol abuse Brother   . Diabetes Sister     CURRENT MEDICATIONS:  Current Outpatient Medications  Medication Sig Dispense Refill  . albuterol (VENTOLIN HFA) 108 (90 Base) MCG/ACT inhaler Inhale 2 puffs into the lungs every 6 (six) hours as needed for wheezing or shortness of breath.     . ALPRAZolam (XANAX) 1 MG tablet TAKE (1) TABLET BY MOUTH (4) TIMES DAILY. 120 tablet 3  . Ascorbic Acid (VITAMIN C) 1000 MG tablet Take 1,000 mg by mouth daily.    Marland Kitchen aspirin EC 81 MG tablet Take 81 mg by mouth daily.    Marland Kitchen atorvastatin (LIPITOR) 40 MG tablet TAKE (1) TABLET BY MOUTH ONCE DAILY. 90 tablet 1  . calcium-vitamin D (OSCAL WITH D) 250-125 MG-UNIT tablet Take 1 tablet by mouth daily.    . cholecalciferol (VITAMIN D3) 25 MCG (1000 UT) tablet Take 1,000 Units by mouth daily.    Marland Kitchen docusate sodium (COLACE) 100 MG capsule Take two capsules at bedtime starting the day you receive chemotherapy and then for four days after 30 capsule 3  . gabapentin (NEURONTIN) 300 MG capsule TAKE (2) CAPSULES BY MOUTH THREE TIMES DAILY. 180 capsule 6  . hydrOXYzine (ATARAX/VISTARIL) 50 MG tablet TAKE ONE TABLET BY MOUTH AT BEDTIME AS NEEDED FOR SLEEP. 30 tablet 0  .  Multiple Vitamins-Minerals (CENTRUM SILVER 50+MEN) TABS Take 1 tablet by mouth daily.    Marland Kitchen oxyCODONE-acetaminophen (PERCOCET/ROXICET) 5-325 MG tablet TAKE 1 TABLET EVERY 8 HOURS AS NEEDED FOR SEVERE PAIN. 90 tablet 0  . Pegfilgrastim (NEULASTA ONPRO [place]) Inject into the skin. Every 21 days    . senna (SENOKOT) 8.6 MG tablet Take 3 tablets by mouth daily.     . topotecan in sodium chloride 0.9 % 100 mL Inject into the vein once. Days 1-5 every 21 days    . vitamin B-12 (CYANOCOBALAMIN) 100 MCG tablet Take 100 mcg by mouth daily.    . ondansetron (ZOFRAN) 8 MG tablet Take 1 tablet (8 mg total) by mouth 2 (two) times daily as needed. Start on the third day after cisplatin chemotherapy. (Patient not taking: Reported on 06/13/2020) 30 tablet 1  .  prochlorperazine (COMPAZINE) 10 MG tablet Take 1 tablet (10 mg total) by mouth every 6 (six) hours as needed (Nausea or vomiting). (Patient not taking: Reported on 06/13/2020) 60 tablet 1   No current facility-administered medications for this visit.    ALLERGIES:  Allergies  Allergen Reactions  . Codeine Nausea Only  . Niaspan [Niacin Er]     PHYSICAL EXAM:  Performance status (ECOG): 1 - Symptomatic but completely ambulatory  Vitals:   06/13/20 0934  BP: (!) 115/49  Pulse: 63  Resp: 16  Temp: (!) 97 F (36.1 C)  SpO2: 97%   Wt Readings from Last 3 Encounters:  06/13/20 148 lb (67.1 kg)  05/22/20 146 lb 11.2 oz (66.5 kg)  05/01/20 148 lb (67.1 kg)   Physical Exam Vitals reviewed.  Constitutional:      Appearance: Normal appearance.  Cardiovascular:     Rate and Rhythm: Normal rate and regular rhythm.     Pulses: Normal pulses.     Heart sounds: Normal heart sounds.  Pulmonary:     Effort: Pulmonary effort is normal.     Breath sounds: Normal breath sounds.  Neurological:     General: No focal deficit present.     Mental Status: He is alert and oriented to person, place, and time.  Psychiatric:        Mood and Affect: Mood normal.         Behavior: Behavior normal.     LABORATORY DATA:  I have reviewed the labs as listed.  CBC Latest Ref Rng & Units 06/13/2020 05/22/2020 05/01/2020  WBC 4.0 - 10.5 K/uL 5.3 4.0 4.6  Hemoglobin 13.0 - 17.0 g/dL 10.7(L) 10.7(L) 10.3(L)  Hematocrit 39 - 52 % 33.0(L) 33.3(L) 31.7(L)  Platelets 150 - 400 K/uL 211 250 193   CMP Latest Ref Rng & Units 06/13/2020 05/22/2020 05/01/2020  Glucose 70 - 99 mg/dL 100(H) 97 106(H)  BUN 8 - 23 mg/dL $Remove'18 13 19  'dpzzCNP$ Creatinine 0.61 - 1.24 mg/dL 0.95 1.00 0.91  Sodium 135 - 145 mmol/L 134(L) 136 139  Potassium 3.5 - 5.1 mmol/L 4.2 4.4 4.0  Chloride 98 - 111 mmol/L 104 101 104  CO2 22 - 32 mmol/L $RemoveB'25 26 25  'mcdCSURL$ Calcium 8.9 - 10.3 mg/dL 9.0 9.0 8.7(L)  Total Protein 6.5 - 8.1 g/dL 6.8 6.8 6.7  Total Bilirubin 0.3 - 1.2 mg/dL 0.4 0.5 0.6  Alkaline Phos 38 - 126 U/L 49 55 52  AST 15 - 41 U/L $Remo'17 17 16  'xQPEZ$ ALT 0 - 44 U/L $Remo'16 14 13    'DmkbQ$ DIAGNOSTIC IMAGING:  I have independently reviewed the scans and discussed with the patient. 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 Small cell lung cancer, assess treatment response. Additional history provided: Status post chemotherapy, consideration for next cycle of chemotherapy. EXAM: CT NECK WITH CONTRAST TECHNIQUE: Multidetector CT imaging of the neck was performed using the standard protocol following the bolus administration of intravenous contrast. CONTRAST:  133mL OMNIPAQUE IOHEXOL 300 MG/ML  SOLN COMPARISON:  Prior neck CT examinations 02/23/2020 and 12/15/2019. FINDINGS: Pharynx and larynx: Again demonstrated is an ovoid hypodense lesion within the left nasopharyngeal region demonstrating subtle peripheral enhancement. As before, the lesion measures 2.1 x 1.4 cm in transaxial dimensions (series 2, image 15). This is again favored to reflect a necrotic (or two adjacent necrotic) retropharyngeal lymph nodes (nodes of [place]). Otherwise, there is  no swelling or discrete  mass within the oral cavity, pharynx or larynx. Salivary glands: No inflammation, mass, or stone. Thyroid: Unremarkable. Lymph nodes: No pathologically enlarged lymph nodes are identified elsewhere within the neck. Vascular: The major vascular structures of the neck are patent. Calcified atherosclerotic plaque within the carotid bifurcations and proximal ICAs. Limited intracranial: No acute intracranial abnormality identified. Visualized orbits: Excluded from the field of view. Mastoids and visualized paranasal sinuses: No significant paranasal sinus disease or mastoid effusion at the imaged levels. Skeleton: No acute bony abnormality or aggressive osseous lesion. Cervical spondylosis without high-grade bony spinal canal stenosis. Upper chest: Reported separately on concurrently performed chest CT 05/17/2020. IMPRESSION: Stable examination as compared to the neck CT of 02/23/2020. Unchanged size and appearance of a 2.1 x 1.4 cm peripherally enhancing hypodense lesion within the left nasopharyngeal region. This is again favored to reflect a necrotic (or two adjacent necrotic) retropharyngeal lymph nodes, rather than a primary nasopharyngeal mass. No pathologically enlarged lymph nodes elsewhere within the neck. Please refer to the separately reported chest CT for a description of intrathoracic findings. Electronically Signed   By: Jackey Loge DO   On: 05/17/2020 17:48   CT Chest W Contrast  Result Date: 05/17/2020 CLINICAL DATA:  Small cell lung cancer, assess treatment response EXAM: CT CHEST, ABDOMEN, AND PELVIS WITH CONTRAST TECHNIQUE: Multidetector CT imaging of the chest, abdomen and pelvis was performed following the standard protocol during bolus administration of intravenous contrast. CONTRAST:  OMNIPAQUE IOHEXOL 300 MG/ML  SOLN COMPARISON:  February 23, 2020 FINDINGS: CT CHEST FINDINGS Cardiovascular: Calcified and noncalcified atheromatous plaque in the thoracic aorta. Heart  size is normal. RIGHT-sided Port-A-Cath terminates at the caval to atrial junction. Central pulmonary vasculature is normal. Mediastinum/Nodes: Esophagus unremarkable. No thoracic inlet adenopathy. No axillary lymphadenopathy. No hilar adenopathy. No mediastinal adenopathy. Lungs/Pleura: Severe pulmonary emphysema worse at the lung apices. Airways are patent. Linear volume loss or scarring developing in the lingula. Shows no nodular features. No pleural effusion. Irregular nodular foci in the chest with no change, approximately 1 cm area in the LEFT lower lobe (image 132, series 3) 0.6 cm area in the LEFT lower lobe (image 129, series 3) areas are unchanged. Irregular nodular density in the RIGHT lower lobe (image 130, series 3) approximately 1.6 cm also stable. No new pulmonary nodules. Musculoskeletal: See below for full musculoskeletal details. CT ABDOMEN PELVIS FINDINGS Hepatobiliary: No focal, suspicious hepatic lesion. The portal vein is patent. No pericholecystic stranding. Gallbladder is decompressed. No biliary duct dilation. Pancreas: Normal Spleen: Normal Adrenals/Urinary Tract: Adrenal glands are normal. Low-density lesions in the kidneys compatible with cysts unchanged. No hydronephrosis. Urinary bladder under distended limiting assessment. Stomach/Bowel: No acute gastrointestinal process. Appendix is normal. Vascular/Lymphatic: Calcified and noncalcified atheromatous plaque in the abdominal aorta. No aneurysmal dilation. There is no gastrohepatic or hepatoduodenal ligament lymphadenopathy. No retroperitoneal or mesenteric lymphadenopathy. No pelvic sidewall lymphadenopathy. Reproductive: Prostate unremarkable by CT. Other: No ascites. Musculoskeletal: No acute bone finding. No destructive bone process. Spinal degenerative changes. IMPRESSION: 1. No evidence of recurrent or metastatic disease. 2. Stable irregular pulmonary nodules bilaterally. 3. Emphysema and aortic atherosclerosis. Aortic  Atherosclerosis (ICD10-I70.0) and Emphysema (ICD10-J43.9). Electronically Signed   By: Donzetta Kohut M.D.   On: 05/17/2020 15:58   CT Abdomen W Contrast  Result Date: 05/17/2020 CLINICAL DATA:  Small cell lung cancer, assess treatment response EXAM: CT CHEST, ABDOMEN, AND PELVIS WITH CONTRAST TECHNIQUE: Multidetector CT imaging of the chest, abdomen and pelvis was performed following the standard protocol during bolus  administration of intravenous contrast. CONTRAST:  149mL OMNIPAQUE IOHEXOL 300 MG/ML  SOLN COMPARISON:  February 23, 2020 FINDINGS: CT CHEST FINDINGS Cardiovascular: Calcified and noncalcified atheromatous plaque in the thoracic aorta. Heart size is normal. RIGHT-sided Port-A-Cath terminates at the caval to atrial junction. Central pulmonary vasculature is normal. Mediastinum/Nodes: Esophagus unremarkable. No thoracic inlet adenopathy. No axillary lymphadenopathy. No hilar adenopathy. No mediastinal adenopathy. Lungs/Pleura: Severe pulmonary emphysema worse at the lung apices. Airways are patent. Linear volume loss or scarring developing in the lingula. Shows no nodular features. No pleural effusion. Irregular nodular foci in the chest with no change, approximately 1 cm area in the LEFT lower lobe (image 132, series 3) 0.6 cm area in the LEFT lower lobe (image 129, series 3) areas are unchanged. Irregular nodular density in the RIGHT lower lobe (image 130, series 3) approximately 1.6 cm also stable. No new pulmonary nodules. Musculoskeletal: See below for full musculoskeletal details. CT ABDOMEN PELVIS FINDINGS Hepatobiliary: No focal, suspicious hepatic lesion. The portal vein is patent. No pericholecystic stranding. Gallbladder is decompressed. No biliary duct dilation. Pancreas: Normal Spleen: Normal Adrenals/Urinary Tract: Adrenal glands are normal. Low-density lesions in the kidneys compatible with cysts unchanged. No hydronephrosis. Urinary bladder under distended limiting assessment.  Stomach/Bowel: No acute gastrointestinal process. Appendix is normal. Vascular/Lymphatic: Calcified and noncalcified atheromatous plaque in the abdominal aorta. No aneurysmal dilation. There is no gastrohepatic or hepatoduodenal ligament lymphadenopathy. No retroperitoneal or mesenteric lymphadenopathy. No pelvic sidewall lymphadenopathy. Reproductive: Prostate unremarkable by CT. Other: No ascites. Musculoskeletal: No acute bone finding. No destructive bone process. Spinal degenerative changes. IMPRESSION: 1. No evidence of recurrent or metastatic disease. 2. Stable irregular pulmonary nodules bilaterally. 3. Emphysema and aortic atherosclerosis. Aortic Atherosclerosis (ICD10-I70.0) and Emphysema (ICD10-J43.9). Electronically Signed   By: Zetta Bills M.D.   On: 05/17/2020 15:58     ASSESSMENT:  1. 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 -CT CAP on 12/15/2019 showed enlarging prevascular and right paratracheal lymph nodes. Stable left lower lobe lung nodules. -CT neck on 12/15/2019 showed malignant lymphadenopathy in the lower right posterior triangle and jugular chain. -Lurbinectidin 3.2mg /kgstarted on 12/22/2019. -CT neck on 02/23/2020 showed marked reduction of lower right neck and right supraclavicular adenopathy. -CT CAP on 02/23/2020 showed treatment response with resolved mediastinal adenopathy. No evidence of new or progressive metastatic disease. Scattered bilateral pulmonary nodules. -CT CAP from 05/17/2020 with no evidence of recurrence or metastatic disease.  Stable irregular pulmonary nodules bilaterally. -CT neck from 05/17/2020 showed unchanged 2.1 x 1.4 cm peripherally enhancing hypodense lesion within the left nasopharyngeal region, favored to be a necrotic lymph node.  No pathologically enlarged lymph nodes in the neck.   PLAN:  1. 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 -He has few days of fatigue which improves. -Reviewed his labs which showed normal LFTs.  CBC shows normal white count. -Proceed with the chemotherapy today and in 3 weeks.  RTC in 6 weeks.  Plan to repeat scans in 3 months.  2. Normocytic anemia: -Hemoglobin today is 10.7.  No iron infusion needed.  3. Chronic pain: -Continue Percocet 1 and half tablets twice daily.  4. Peripheral neuropathy: -Continue gabapentin 600 mg 3 times a day.  5. Sleeping difficulty: -Continue Vistaril as needed.   Orders placed this encounter:  No orders of the defined types were placed in this encounter.    Derek Jack, MD [place] [phone]  I, Daniel Khashchuk, am acting as a scribe for Dr. Dezeray Puccio Katagadda.  I, Ersel Enslin MD, have reviewed the above documentation for accuracy and completeness, and I agree with the above.     

## 2020-06-13 NOTE — Patient Instructions (Signed)
[facility] Discharge Instructions for Patients Receiving Chemotherapy  Today you received the following chemotherapy agents Zepzelca  To help prevent nausea and vomiting after your treatment, we encourage you to take your nausea medication as directed.    If you develop nausea and vomiting that is not controlled by your nausea medication, call the clinic.   BELOW ARE SYMPTOMS THAT SHOULD BE REPORTED IMMEDIATELY:  *FEVER GREATER THAN 100.5 F  *CHILLS WITH OR WITHOUT FEVER  NAUSEA AND VOMITING THAT IS NOT CONTROLLED WITH YOUR NAUSEA MEDICATION  *UNUSUAL SHORTNESS OF BREATH  *UNUSUAL BRUISING OR BLEEDING  TENDERNESS IN MOUTH AND THROAT WITH OR WITHOUT PRESENCE OF ULCERS  *URINARY PROBLEMS  *BOWEL PROBLEMS  UNUSUAL RASH Items with * indicate a potential emergency and should be followed up as soon as possible.  Feel free to call the clinic should you have any questions or concerns. The clinic phone number is [phone].  Please show the [facility] at check-in to the Emergency Department and triage nurse.

## 2020-06-22 ENCOUNTER — Other Ambulatory Visit: Payer: Self-pay | Admitting: Family Medicine

## 2020-06-22 DIAGNOSIS — F411 Generalized anxiety disorder: Secondary | ICD-10-CM

## 2020-06-22 NOTE — Telephone Encounter (Signed)
Ok to refill??  Last office visit 03/01/2020.  Last refill 03/29/2020, #3 refills.

## 2020-06-30 ENCOUNTER — Other Ambulatory Visit: Payer: Self-pay | Admitting: Family Medicine

## 2020-06-30 ENCOUNTER — Other Ambulatory Visit (HOSPITAL_COMMUNITY): Payer: Self-pay | Admitting: Hematology

## 2020-06-30 DIAGNOSIS — C3490 Malignant neoplasm of unspecified part of unspecified bronchus or lung: Secondary | ICD-10-CM

## 2020-07-04 ENCOUNTER — Inpatient Hospital Stay (HOSPITAL_COMMUNITY): Payer: Medicare HMO

## 2020-07-04 ENCOUNTER — Other Ambulatory Visit (HOSPITAL_COMMUNITY): Payer: Self-pay | Admitting: *Deleted

## 2020-07-04 ENCOUNTER — Encounter (HOSPITAL_COMMUNITY): Payer: Self-pay

## 2020-07-04 ENCOUNTER — Other Ambulatory Visit: Payer: Self-pay

## 2020-07-04 VITALS — BP 147/48 | HR 58 | Temp 97.2°F | Resp 18 | Wt 149.2 lb

## 2020-07-04 DIAGNOSIS — Z79899 Other long term (current) drug therapy: Secondary | ICD-10-CM | POA: Diagnosis not present

## 2020-07-04 DIAGNOSIS — C3490 Malignant neoplasm of unspecified part of unspecified bronchus or lung: Secondary | ICD-10-CM | POA: Diagnosis not present

## 2020-07-04 DIAGNOSIS — Z5111 Encounter for antineoplastic chemotherapy: Secondary | ICD-10-CM | POA: Diagnosis not present

## 2020-07-04 DIAGNOSIS — C770 Secondary and unspecified malignant neoplasm of lymph nodes of head, face and neck: Secondary | ICD-10-CM | POA: Diagnosis not present

## 2020-07-04 DIAGNOSIS — C787 Secondary malignant neoplasm of liver and intrahepatic bile duct: Secondary | ICD-10-CM | POA: Diagnosis not present

## 2020-07-04 DIAGNOSIS — F17210 Nicotine dependence, cigarettes, uncomplicated: Secondary | ICD-10-CM | POA: Diagnosis not present

## 2020-07-04 LAB — CBC WITH DIFFERENTIAL/PLATELET
Abs Immature Granulocytes: 0.01 10*3/uL (ref 0.00–0.07)
Basophils Absolute: 0 10*3/uL (ref 0.0–0.1)
Basophils Relative: 1 %
Eosinophils Absolute: 0.1 10*3/uL (ref 0.0–0.5)
Eosinophils Relative: 2 %
HCT: 33.6 % — ABNORMAL LOW (ref 39.0–52.0)
Hemoglobin: 10.9 g/dL — ABNORMAL LOW (ref 13.0–17.0)
Immature Granulocytes: 0 %
Lymphocytes Relative: 29 %
Lymphs Abs: 1.2 10*3/uL (ref 0.7–4.0)
MCH: 32.2 pg (ref 26.0–34.0)
MCHC: 32.4 g/dL (ref 30.0–36.0)
MCV: 99.1 fL (ref 80.0–100.0)
Monocytes Absolute: 0.5 10*3/uL (ref 0.1–1.0)
Monocytes Relative: 13 %
Neutro Abs: 2.3 10*3/uL (ref 1.7–7.7)
Neutrophils Relative %: 55 %
Platelets: 213 10*3/uL (ref 150–400)
RBC: 3.39 MIL/uL — ABNORMAL LOW (ref 4.22–5.81)
RDW: 19.1 % — ABNORMAL HIGH (ref 11.5–15.5)
WBC: 4.1 10*3/uL (ref 4.0–10.5)
nRBC: 0 % (ref 0.0–0.2)

## 2020-07-04 LAB — COMPREHENSIVE METABOLIC PANEL
ALT: 14 U/L (ref 0–44)
AST: 16 U/L (ref 15–41)
Albumin: 4 g/dL (ref 3.5–5.0)
Alkaline Phosphatase: 46 U/L (ref 38–126)
Anion gap: 6 (ref 5–15)
BUN: 18 mg/dL (ref 8–23)
CO2: 23 mmol/L (ref 22–32)
Calcium: 8.8 mg/dL — ABNORMAL LOW (ref 8.9–10.3)
Chloride: 104 mmol/L (ref 98–111)
Creatinine, Ser: 0.92 mg/dL (ref 0.61–1.24)
GFR, Estimated: >60 mL/min (ref >60)
Glucose, Bld: 99 mg/dL (ref 70–99)
Potassium: 4.1 mmol/L (ref 3.5–5.1)
Sodium: 133 mmol/L — ABNORMAL LOW (ref 135–145)
Total Bilirubin: 0.4 mg/dL (ref 0.3–1.2)
Total Protein: 6.7 g/dL (ref 6.5–8.1)

## 2020-07-04 LAB — MAGNESIUM: Magnesium: 1.9 mg/dL (ref 1.7–2.4)

## 2020-07-04 MED ORDER — OXYCODONE-ACETAMINOPHEN 5-325 MG PO TABS: ORAL_TABLET | ORAL | 0 refills | Status: DC

## 2020-07-04 MED ORDER — HEPARIN SOD (PORK) LOCK FLUSH 100 UNIT/ML IV SOLN
500.0000 [IU] | Freq: Once | INTRAVENOUS | Status: AC | PRN
  Administered 2020-07-04: 500 [IU]

## 2020-07-04 MED ORDER — SODIUM CHLORIDE 0.9 % IV SOLN
2.6000 mg/m2 | Freq: Once | INTRAVENOUS | Status: AC
  Administered 2020-07-04: 4.75 mg via INTRAVENOUS
  Filled 2020-07-04: qty 9.5

## 2020-07-04 MED ORDER — SODIUM CHLORIDE 0.9 % IV SOLN
10.0000 mg | Freq: Once | INTRAVENOUS | Status: AC
  Administered 2020-07-04: 10 mg via INTRAVENOUS
  Filled 2020-07-04: qty 10

## 2020-07-04 MED ORDER — SODIUM CHLORIDE 0.9 % IV SOLN: Freq: Once | INTRAVENOUS | Status: AC

## 2020-07-04 MED ORDER — PALONOSETRON HCL INJECTION 0.25 MG/5ML
0.2500 mg | Freq: Once | INTRAVENOUS | Status: AC
  Administered 2020-07-04: 0.25 mg via INTRAVENOUS
  Filled 2020-07-04: qty 5

## 2020-07-04 NOTE — Progress Notes (Signed)
Labs and assessment findings WNL for Zepzelca infusion today.   Tolerated infusion w/o adverse reaction.  Alert, in no distress.  VSS.  Discharged ambulatory in stable condition.

## 2020-07-25 ENCOUNTER — Inpatient Hospital Stay (HOSPITAL_COMMUNITY): Payer: Medicare HMO | Attending: Hematology

## 2020-07-25 ENCOUNTER — Inpatient Hospital Stay (HOSPITAL_COMMUNITY): Payer: Medicare HMO | Admitting: Hematology

## 2020-07-25 ENCOUNTER — Inpatient Hospital Stay (HOSPITAL_COMMUNITY): Payer: Medicare HMO

## 2020-07-25 ENCOUNTER — Other Ambulatory Visit: Payer: Self-pay

## 2020-07-25 VITALS — BP 116/49 | HR 51 | Temp 97.1°F | Resp 17 | Wt 149.3 lb

## 2020-07-25 VITALS — BP 122/54 | HR 54 | Temp 96.9°F | Resp 18

## 2020-07-25 DIAGNOSIS — C3490 Malignant neoplasm of unspecified part of unspecified bronchus or lung: Secondary | ICD-10-CM | POA: Diagnosis not present

## 2020-07-25 DIAGNOSIS — Z79899 Other long term (current) drug therapy: Secondary | ICD-10-CM | POA: Diagnosis not present

## 2020-07-25 DIAGNOSIS — C787 Secondary malignant neoplasm of liver and intrahepatic bile duct: Secondary | ICD-10-CM | POA: Insufficient documentation

## 2020-07-25 DIAGNOSIS — Z5111 Encounter for antineoplastic chemotherapy: Secondary | ICD-10-CM | POA: Diagnosis not present

## 2020-07-25 LAB — COMPREHENSIVE METABOLIC PANEL
ALT: 15 U/L (ref 0–44)
AST: 17 U/L (ref 15–41)
Albumin: 3.9 g/dL (ref 3.5–5.0)
Alkaline Phosphatase: 51 U/L (ref 38–126)
Anion gap: 8 (ref 5–15)
BUN: 12 mg/dL (ref 8–23)
CO2: 26 mmol/L (ref 22–32)
Calcium: 8.9 mg/dL (ref 8.9–10.3)
Chloride: 104 mmol/L (ref 98–111)
Creatinine, Ser: 0.94 mg/dL (ref 0.61–1.24)
GFR, Estimated: >60 mL/min (ref >60)
Glucose, Bld: 94 mg/dL (ref 70–99)
Potassium: 4.3 mmol/L (ref 3.5–5.1)
Sodium: 138 mmol/L (ref 135–145)
Total Bilirubin: 0.6 mg/dL (ref 0.3–1.2)
Total Protein: 6.7 g/dL (ref 6.5–8.1)

## 2020-07-25 LAB — CBC WITH DIFFERENTIAL/PLATELET
Abs Immature Granulocytes: 0.02 10*3/uL (ref 0.00–0.07)
Basophils Absolute: 0 10*3/uL (ref 0.0–0.1)
Basophils Relative: 1 %
Eosinophils Absolute: 0.1 10*3/uL (ref 0.0–0.5)
Eosinophils Relative: 2 %
HCT: 33.5 % — ABNORMAL LOW (ref 39.0–52.0)
Hemoglobin: 10.7 g/dL — ABNORMAL LOW (ref 13.0–17.0)
Immature Granulocytes: 1 %
Lymphocytes Relative: 31 %
Lymphs Abs: 1.1 10*3/uL (ref 0.7–4.0)
MCH: 31.9 pg (ref 26.0–34.0)
MCHC: 31.9 g/dL (ref 30.0–36.0)
MCV: 100 fL (ref 80.0–100.0)
Monocytes Absolute: 0.6 10*3/uL (ref 0.1–1.0)
Monocytes Relative: 16 %
Neutro Abs: 1.8 10*3/uL (ref 1.7–7.7)
Neutrophils Relative %: 49 %
Platelets: 229 10*3/uL (ref 150–400)
RBC: 3.35 MIL/uL — ABNORMAL LOW (ref 4.22–5.81)
RDW: 19.3 % — ABNORMAL HIGH (ref 11.5–15.5)
WBC: 3.6 10*3/uL — ABNORMAL LOW (ref 4.0–10.5)
nRBC: 0 % (ref 0.0–0.2)

## 2020-07-25 LAB — MAGNESIUM: Magnesium: 2 mg/dL (ref 1.7–2.4)

## 2020-07-25 MED ORDER — SODIUM CHLORIDE 0.9% FLUSH
10.0000 mL | INTRAVENOUS | Status: DC | PRN
  Administered 2020-07-25: 10 mL

## 2020-07-25 MED ORDER — SODIUM CHLORIDE 0.9 % IV SOLN: Freq: Once | INTRAVENOUS | Status: AC

## 2020-07-25 MED ORDER — HEPARIN SOD (PORK) LOCK FLUSH 100 UNIT/ML IV SOLN
500.0000 [IU] | Freq: Once | INTRAVENOUS | Status: AC | PRN
  Administered 2020-07-25: 500 [IU]

## 2020-07-25 MED ORDER — SODIUM CHLORIDE 0.9 % IV SOLN
2.6000 mg/m2 | Freq: Once | INTRAVENOUS | Status: AC
  Administered 2020-07-25: 4.75 mg via INTRAVENOUS
  Filled 2020-07-25: qty 9.5

## 2020-07-25 MED ORDER — MIRTAZAPINE 15 MG PO TABS: 15.0000 mg | ORAL_TABLET | Freq: Every day | ORAL | 1 refills | Status: DC

## 2020-07-25 MED ORDER — PALONOSETRON HCL INJECTION 0.25 MG/5ML
0.2500 mg | Freq: Once | INTRAVENOUS | Status: AC
  Administered 2020-07-25: 0.25 mg via INTRAVENOUS
  Filled 2020-07-25: qty 5

## 2020-07-25 MED ORDER — SODIUM CHLORIDE 0.9 % IV SOLN
10.0000 mg | Freq: Once | INTRAVENOUS | Status: AC
  Administered 2020-07-25: 10 mg via INTRAVENOUS
  Filled 2020-07-25: qty 10

## 2020-07-25 NOTE — Patient Instructions (Addendum)
[place] at [facility] Discharge Instructions  You were seen today by Dr. Delton Coombes. He went over your recent results and scans. You received your treatment today; continue getting your treatment every 3 weeks. You will be prescribed Remeron 15 mg to help you sleep and take 1 tablet 1 hour before bedtime; if after 1 week it does not help, start taking 2 tablets 1 hour before bedtime. You will be scheduled for CT scans of your neck, chest and abdomen before your next visit. Dr. Delton Coombes will see you back in 6 weeks for labs and follow up.   Thank you for choosing [place] at [facility] to provide your oncology and hematology care.  To afford each patient quality time with our provider, please arrive at least 15 minutes before your scheduled appointment time.   If you have a lab appointment with the [facility] please come in thru the Main Entrance and check in at the main information desk  You need to re-schedule your appointment should you arrive 10 or more minutes late.  We strive to give you quality time with our providers, and arriving late affects you and other patients whose appointments are after yours.  Also, if you no show three or more times for appointments you may be dismissed from the clinic at the providers discretion.     Again, thank you for choosing [facility].  Our hope is that these requests will decrease the amount of time that you wait before being seen by our physicians.       _____________________________________________________________  Should you have questions after your visit to [facility], please contact our office at [phone] between the hours of 8:00 a.m. and 4:30 p.m.  Voicemails left after 4:00 p.m. will not be returned until the following business day.  For prescription refill requests, have your pharmacy contact our office and allow 72 hours.    Cancer Center Support Programs:    > Cancer Support Group  2nd Tuesday of the month 1pm-2pm, Journey Room

## 2020-07-25 NOTE — Progress Notes (Signed)
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 Bulky 5.4 cm right supraclavicular region malignant lymph node conglomeration with extracapsular extension. 2. Surrounding smaller abnormal right level 3 and level 5 lymph nodes, and the lymphadenopathy continues into the superior mediastinum, see Chest CT findings reported separately. 3. No other metastatic disease identified in the neck.   01/16/2017 Imaging   CT chest: IMPRESSION: 1. Extensive lymphadenopathy in the thorax and lower right cervical region, as discussed above. Primary differential considerations include lymphoma/leukemia or small cell carcinoma of the lung. Further evaluation a PET-CT could be considered to assess for additional sites of disease below the diaphragm if clinically appropriate. Additionally, ultrasound-guided biopsy of supraclavicular lymphadenopathy could be considered to establish a tissue diagnosis. 2. Indeterminate lesion in the periphery of segment 8 of the liver measuring 2.7 x 1.7 cm. Attention at time of follow-up PET-CT is recommended. 3. Aortic atherosclerosis, in addition to left main and 3 vessel coronary artery disease. Please note that although the presence of coronary artery calcium documents the presence of coronary artery disease, the severity of this disease and any potential stenosis cannot be assessed on this non-gated CT examination. Assessment for potential risk factor  modification, dietary therapy or pharmacologic therapy may be warranted, if clinically indicated. 4. There are calcifications of the aortic valve. Echocardiographic correlation for evaluation of potential valvular dysfunction may be warranted if clinically indicated. 5. Diffuse bronchial wall thickening with moderate centrilobular and paraseptal emphysema; imaging findings suggestive of underlying COPD.   02/03/2017 Initial Biopsy   (R) neck lymph node biopsy: SMALL CELL CARCINOMA (most likely lung primary).    02/03/2017 Miscellaneous   Port-a-cath attempted by IR; unable to place d/t enlarged SVC.    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 Staging for metastatic disease  EXAM: MRI HEAD WITHOUT AND WITH CONTRAST  TECHNIQUE: Multiplanar, multiecho pulse sequences of the brain and surrounding structures were obtained without and with intravenous contrast.  CONTRAST:  57m MULTIHANCE GADOBENATE DIMEGLUMINE 529 MG/ML IV SOLN  COMPARISON:  None.  FINDINGS: Brain: Negative for hydrocephalus. Cerebral volume normal for age. Small nonenhancing white matter hyperintensities consistent with mild chronic microvascular ischemia. No acute infarct. Negative for hemorrhage or mass or edema  Normal enhancement postcontrast infusion. No enhancing mass lesion. Leptomeningeal enhancement is normal.  Vascular: Normal arterial flow voids.  Normal venous enhancement  Skull and upper cervical spine: Negative  Sinuses/Orbits: Negative  Other: None  IMPRESSION: Negative for metastatic disease.  No acute abnormality.  Mild chronic white matter changes.   04/07/2017 Imaging    PET:  1. Marked reduction in size and metabolic activity of bulky RIGHT supraclavicular  adenopathy mediastinal lymphadenopathy. 2. Residual moderate activity remains within small RIGHT supraclavicular lymph node, RIGHT lower paratracheal lymph  node and RIGHT hilar lymph node. 3. Resolution of prevascular and internal mammary mediastinal metastatic hypermetabolic activity. 4. Resolution of metabolic activity associated with solitary RIGHT hepatic lobe liver metastasis. 5. No evidence of disease progression. 6. 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   05/27/2017 Imaging   MRI brain w/ and w/o contrast IMPRESSION: 1. No metastatic disease identified. 2. Increased nonspecific cerebral white matter signal changes since August. These are most commonly small vessel disease related. 3. New right maxillary sinusitis. Benign appearing retention cysts in the nasopharynx with trace mastoid effusions.   06/12/2017 Imaging   PET-CT IMPRESSION: 1. There are two new hypermetabolic nodules identified within both lower lobes measuring up to 3.1 cm. The appearance is nonspecific and may be inflammatory/infectious in etiology. Pulmonary metastatic disease cannot be excluded and short-term follow-up imaging in 3 months is advised to reassess these nodules. 2. Stable appearance of mild hypermetabolic activity associated with right paratracheal and right hilar lymph nodes. 3. Decrease in FDG uptake associated with index right supraclavicular lymph node. 4. 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 5. Aortic Atherosclerosis (ICD10-I70.0) and Emphysema (ICD10-J43.9).   08/24/2017 - 11/18/2019 Chemotherapy   The patient had pegfilgrastim (NEULASTA ONPRO KIT) injection 6 mg, 6 mg, Subcutaneous, Once, 30 of 31 cycles Administration: 6 mg (08/28/2017), 6 mg (09/18/2017), 6 mg (10/09/2017), 6 mg  (11/02/2017), 6 mg (11/20/2017), 6 mg (12/18/2017), 6 mg (01/15/2018), 6 mg (02/12/2018), 6 mg (03/12/2018), 6 mg (04/09/2018), 6 mg (05/07/2018), 6 mg (06/04/2018), 6 mg (07/02/2018), 6 mg (07/30/2018), 6 mg (08/27/2018), 6 mg (09/24/2018), 6 mg (10/29/2018), 6 mg (11/26/2018), 6 mg (12/24/2018), 6 mg (01/21/2019), 6 mg (02/18/2019), 6 mg (03/18/2019), 6 mg (04/15/2019), 6 mg (05/13/2019), 6 mg (06/17/2019), 6 mg (07/22/2019), 6 mg (08/19/2019), 6 mg (09/16/2019), 6 mg (10/21/2019), 6 mg (11/18/2019) topotecan (HYCAMTIN) 2.9 mg in sodium chloride 0.9 % 100 mL chemo infusion, 1.5 mg/m2 = 2.9 mg, Intravenous,  Once, 30 of 31 cycles Dose modification: 1.2 mg/m2 (80 % of original dose 1.5 mg/m2, Cycle 4, Reason: Dose Not Tolerated) Administration: 2.9 mg (08/24/2017), 2.9 mg (08/25/2017), 2.9 mg (08/28/2017), 2.9 mg (08/26/2017), 2.9 mg (08/27/2017), 2.9 mg (09/14/2017), 2.9 mg (09/15/2017), 2.9 mg (09/16/2017), 2.9 mg (09/17/2017), 2.9 mg (09/18/2017), 2.9 mg (10/05/2017), 2.9 mg (10/06/2017), 2.9 mg (10/07/2017), 2.9 mg (10/08/2017), 2.9 mg (10/09/2017), 2.3 mg (10/26/2017), 2.3 mg (10/27/2017), 2.3 mg (10/28/2017), 2.3 mg (10/29/2017), 2.3 mg (11/02/2017), 2.3 mg (11/16/2017), 2.3 mg (11/17/2017), 2.3 mg (11/18/2017), 2.3 mg (11/19/2017), 2.3 mg (11/20/2017), 2.3 mg (12/14/2017), 2.3 mg (12/15/2017), 2.3 mg (12/16/2017), 2.3 mg (12/17/2017), 2.3 mg (12/18/2017), 2.3 mg (01/11/2018), 2.3 mg (01/12/2018), 2.3 mg (01/13/2018), 2.3 mg (01/15/2018), 2.3 mg (02/08/2018), 2.3 mg (02/09/2018), 2.3 mg (02/10/2018), 2.3 mg (02/11/2018), 2.3 mg (02/12/2018), 2.3 mg (03/08/2018), 2.3 mg (03/09/2018), 2.3 mg (03/10/2018), 2.3 mg (03/11/2018), 2.3 mg (03/12/2018), 2.2 mg (04/05/2018), 2.2 mg (04/06/2018), 2.2 mg (04/07/2018), 2.2 mg (04/08/2018), 2.2 mg (04/09/2018), 2.2 mg (05/03/2018), 2.2 mg (05/04/2018), 2.2 mg (05/05/2018), 2.2 mg (05/06/2018), 2.2 mg (05/07/2018), 2.2 mg (05/31/2018), 2.2 mg (06/01/2018), 2.2 mg (06/02/2018), 2.2 mg (06/03/2018), 2.2 mg (06/04/2018), 2.2 mg (06/28/2018), 2.2 mg (06/29/2018), 2.2 mg  (06/30/2018), 2.2 mg (07/01/2018), 2.2 mg (07/02/2018), 2.2 mg (07/26/2018), 2.2 mg (07/27/2018), 2.2 mg (07/28/2018), 2.2 mg (07/29/2018), 2.2 mg (07/30/2018), 2.2 mg (08/23/2018), 2.2 mg (08/24/2018), 2.2 mg (08/25/2018), 2.2 mg (08/26/2018), 2.2  mg (08/27/2018), 2.2 mg (09/20/2018), 2.2 mg (09/21/2018), 2.2 mg (09/22/2018), 2.2 mg (09/23/2018), 2.2 mg (09/24/2018), 2.2 mg (10/25/2018), 2.2 mg (10/26/2018), 2.2 mg (10/27/2018), 2.2 mg (10/28/2018), 2.2 mg (10/29/2018), 2.2 mg (11/22/2018), 2.2 mg (11/23/2018), 2.2 mg (11/24/2018), 2.2 mg (11/25/2018), 2.2 mg (11/26/2018), 2.2 mg (12/20/2018), 2.2 mg (12/21/2018), 2.2 mg (12/22/2018), 2.2 mg (12/23/2018), 2.2 mg (12/24/2018), 2.2 mg (01/17/2019), 2.2 mg (01/18/2019), 2.2 mg (01/19/2019), 2.2 mg (01/20/2019), 2.2 mg (01/21/2019), 2.2 mg (02/14/2019), 2.2 mg (02/15/2019), 2.2 mg (02/16/2019), 2.2 mg (02/17/2019), 2.2 mg (02/18/2019), 2.2 mg (03/14/2019), 2.2 mg (03/15/2019), 2.2 mg (03/16/2019), 2.2 mg (03/17/2019), 2.2 mg (03/18/2019), 2.2 mg (04/11/2019), 2.2 mg (04/12/2019), 2.2 mg (04/13/2019), 2.2 mg (04/14/2019), 2.2 mg (04/15/2019), 2.2 mg (05/09/2019), 2.2 mg (05/10/2019), 2.2 mg (05/11/2019), 2.2 mg (05/12/2019), 2.2 mg (05/13/2019), 2.2 mg (06/13/2019), 2.2 mg (06/14/2019), 2.2 mg (06/15/2019), 2.2 mg (06/16/2019), 2.2 mg (06/17/2019), 2.2 mg (07/18/2019), 2.2 mg (07/19/2019), 2.2 mg (07/20/2019), 2.2 mg (07/21/2019), 2.2 mg (07/22/2019), 2.2 mg (08/15/2019), 2.2 mg (08/16/2019), 2.2 mg (08/17/2019), 2.2 mg (08/18/2019), 2.2 mg (08/19/2019), 2.2 mg (09/12/2019), 2.2 mg (09/13/2019), 2.2 mg (09/14/2019), 2.2 mg (09/15/2019), 2.2 mg (09/16/2019), 2.2 mg (10/17/2019), 2.2 mg (10/18/2019), 2.2 mg (10/19/2019), 2.2 mg (10/20/2019), 2.2 mg (10/21/2019), 2.2 mg (11/14/2019), 2.2 mg (11/15/2019), 2.2 mg (11/16/2019), 2.2 mg (11/17/2019), 2.2 mg (11/18/2019) ondansetron (ZOFRAN) 4 mg in sodium chloride 0.9 % 50 mL IVPB, , Intravenous,  Once, 25 of 26 cycles Administration:  (02/08/2018),  (02/09/2018),  (02/10/2018),  (02/11/2018),  (02/12/2018),  (03/08/2018),  (03/09/2018),  (03/10/2018),   (03/11/2018),  (03/12/2018),  (04/05/2018),  (04/06/2018),  (04/07/2018),  (04/08/2018),  (04/09/2018),  (05/03/2018),  (05/04/2018),  (05/05/2018),  (05/06/2018),  (05/07/2018),  (05/31/2018),  (06/01/2018),  (06/02/2018),  (06/03/2018),  (06/04/2018),  (06/28/2018),  (06/29/2018),  (06/30/2018),  (07/01/2018),  (07/02/2018),  (07/26/2018),  (07/27/2018),  (07/28/2018),  (07/29/2018),  (07/30/2018),  (08/23/2018),  (08/24/2018),  (08/25/2018),  (08/26/2018),  (08/27/2018),  (09/20/2018),  (09/21/2018),  (09/22/2018),  (09/23/2018),  (09/24/2018),  (10/25/2018),  (10/26/2018),  (10/27/2018),  (10/28/2018),  (10/29/2018),  (11/22/2018),  (11/23/2018),  (11/24/2018),  (11/25/2018),  (11/26/2018),  (12/20/2018),  (12/21/2018),  (12/22/2018),  (12/23/2018),  (12/24/2018),  (01/17/2019),  (01/18/2019),  (01/19/2019),  (01/20/2019),  (01/21/2019),  (02/14/2019),  (02/15/2019),  (02/16/2019),  (02/17/2019),  (02/18/2019),  (03/14/2019),  (03/15/2019),  (03/16/2019),  (03/17/2019),  (03/18/2019),  (04/11/2019),  (04/12/2019),  (04/13/2019),  (04/14/2019),  (04/15/2019),  (05/09/2019),  (05/10/2019),  (05/11/2019),  (05/12/2019),  (05/13/2019),  (06/13/2019),  (06/14/2019),  (06/15/2019),  (06/16/2019),  (06/17/2019),  (07/18/2019),  (07/19/2019),  (07/20/2019),  (07/21/2019),  (07/22/2019),  (08/15/2019),  (08/16/2019),  (08/17/2019),  (08/18/2019),  (08/19/2019),  (09/12/2019),  (09/13/2019),  (09/14/2019),  (09/15/2019),  (09/16/2019),  (10/17/2019),  (10/18/2019),  (10/19/2019),  (10/20/2019),  (10/21/2019),  (11/14/2019),  (11/15/2019),  (11/16/2019),  (11/17/2019),  (11/18/2019)  for chemotherapy treatment.    12/22/2019 -  Chemotherapy    Patient is on Treatment Plan: LUNG SMALL CELL LURBINECTEDIN Q21D        CANCER STAGING: Cancer Staging No matching staging information was found for the patient.  INTERVAL HISTORY:  Mr. KHALED HERDA, a 70 y.o. male, returns for routine follow-up and consideration for next cycle of chemotherapy. Jalan was last seen on 06/13/2020.  Due for cycle #11 of lurbinectedin today.   Overall, he  tells me he has been feeling fair. He tolerated the previous treatment well. He reports having a dry mouth since getting chemo and he admits that he is not drinking enough water. He continues experiencing  a couple days of fatigue after receiving treatment. He reports having some sinus congestion in his forehead. He continues complaining of neck pain. He continues having trouble sleeping even while taking Vistaril. His appetite is decreased and he reports having occasional abdominal pain after eating but he maintains his weight. He reports having more wheezing and coughing when he reclines when the weather is cooler and uses his inhaler which does not completely get rid of the wheezing; he does not use the inhaler daily. He denies leg swelling.  He has received both of his COVID injections but has not gotten the booster yet.  Overall, he feels ready for next cycle of chemo today.    REVIEW OF SYSTEMS:  Review of Systems  Constitutional: Positive for appetite change (25%) and fatigue (50%). Negative for unexpected weight change.  HENT:         Sinus congestion  Cardiovascular: Negative for leg swelling.  Gastrointestinal: Positive for abdominal pain (occasional after eating).  Musculoskeletal: Positive for neck pain (7/10 neck pain).  Psychiatric/Behavioral: Positive for sleep disturbance.  All other systems reviewed and are negative.   PAST MEDICAL/SURGICAL HISTORY:  Past Medical History:  Diagnosis Date  . Anxiety   . CAD (coronary artery disease)   . Cancer ([place])    stage 4 small cell lung cancer   . COPD (chronic obstructive pulmonary disease) ([place])   . Depression   . Dyspnea    increased exertion  . Feeling of chest tightness   . Heart palpitations   . History of chemotherapy   . Myocardial infarction ([place])   . Osteopenia   . Panic attacks   . Smoker    Past Surgical History:  Procedure Laterality Date  . BACK SURGERY  12/24/2000   L5,S1  . CORONARY STENT PLACEMENT  2005    RCA & CX  . HERNIA REPAIR Right 1980's  . INGUINAL HERNIA REPAIR  12/1978   right side  . IR FLUORO GUIDE PORT INSERTION RIGHT  04/02/2017  . IR US GUIDE BX ASP/DRAIN  02/03/2017  . IR US GUIDE VASC ACCESS RIGHT  04/02/2017  . NM MYOCAR PERF WALL MOTION  09/07/2009   No ischemia; EF 51%  . SHOULDER SURGERY Left 08/2010  . SPINE SURGERY  2002   L5-S1    SOCIAL HISTORY:  Social History   Socioeconomic History  . Marital status: Single    Spouse name: Not on file  . Number of children: Not on file  . Years of education: Not on file  . Highest education level: Not on file  Occupational History  . Not on file  Tobacco Use  . Smoking status: Current Every Day Smoker    Packs/day: 1.00    Types: Cigarettes  . Smokeless tobacco: Never Used  Vaping Use  . Vaping Use: Never used  Substance and Sexual Activity  . Alcohol use: Yes    Comment: occas  . Drug use: No  . Sexual activity: Not on file  Other Topics Concern  . Not on file  Social History Narrative  . Not on file   Social Determinants of Health   Financial Resource Strain: Low Risk   . Difficulty of Paying Living Expenses: Not hard at all  Food Insecurity: No Food Insecurity  . Worried About Charity fundraiser in the Last Year: Never true  . Ran Out of Food in the Last Year: Never true  Transportation Needs: No Transportation Needs  . Lack of Transportation (Medical):  No  . Lack of Transportation (Non-Medical): No  Physical Activity: Inactive  . Days of Exercise per Week: 0 days  . Minutes of Exercise per Session: 0 min  Stress: No Stress Concern Present  . Feeling of Stress : Not at all  Social Connections: Socially Isolated  . Frequency of Communication with Friends and Family: More than three times a week  . Frequency of Social Gatherings with Friends and Family: More than three times a week  . Attends Religious Services: Never  . Active Member of Clubs or Organizations: No  . Attends Archivist  Meetings: Never  . Marital Status: Never married  Intimate Partner Violence: Not At Risk  . Fear of Current or Ex-Partner: No  . Emotionally Abused: No  . Physically Abused: No  . Sexually Abused: No    FAMILY HISTORY:  Family History  Problem Relation Age of Onset  . Heart attack Father   . Kidney disease Father        renal failure  . Heart failure Mother   . Heart attack Mother   . Cancer Brother   . Diabetes Brother   . Alcohol abuse Brother   . Diabetes Sister     CURRENT MEDICATIONS:  Current Outpatient Medications  Medication Sig Dispense Refill  . ALPRAZolam (XANAX) 1 MG tablet TAKE (1) TABLET BY MOUTH (4) TIMES DAILY. 120 tablet 0  . Ascorbic Acid (VITAMIN C) 1000 MG tablet Take 1,000 mg by mouth daily.    Marland Kitchen aspirin EC 81 MG tablet Take 81 mg by mouth daily.    Marland Kitchen atorvastatin (LIPITOR) 40 MG tablet TAKE (1) TABLET BY MOUTH ONCE DAILY. 90 tablet 0  . calcium-vitamin D (OSCAL WITH D) 250-125 MG-UNIT tablet Take 1 tablet by mouth daily.    . cholecalciferol (VITAMIN D3) 25 MCG (1000 UT) tablet Take 1,000 Units by mouth daily.    Marland Kitchen docusate sodium (COLACE) 100 MG capsule Take two capsules at bedtime starting the day you receive chemotherapy and then for four days after 30 capsule 3  . gabapentin (NEURONTIN) 300 MG capsule TAKE (2) CAPSULES BY MOUTH THREE TIMES DAILY. 180 capsule 6  . hydrOXYzine (ATARAX/VISTARIL) 50 MG tablet TAKE ONE TABLET BY MOUTH AT BEDTIME AS NEEDED FOR SLEEP. 90 tablet 0  . mirtazapine (REMERON) 15 MG tablet Take 1 tablet (15 mg total) by mouth at bedtime. 30 tablet 1  . Multiple Vitamins-Minerals (CENTRUM SILVER 50+MEN) TABS Take 1 tablet by mouth daily.    . ondansetron (ZOFRAN) 8 MG tablet Take 1 tablet (8 mg total) by mouth 2 (two) times daily as needed. Start on the third day after cisplatin chemotherapy. 30 tablet 1  . oxyCODONE-acetaminophen (PERCOCET/ROXICET) 5-325 MG tablet TAKE 1 TABLET EVERY 8 HOURS AS NEEDED FOR SEVERE PAIN. (Patient not  taking: Reported on 07/25/2020) 90 tablet 0  . senna (SENOKOT) 8.6 MG tablet Take 3 tablets by mouth daily.     . vitamin B-12 (CYANOCOBALAMIN) 100 MCG tablet Take 100 mcg by mouth daily.    Marland Kitchen albuterol (VENTOLIN HFA) 108 (90 Base) MCG/ACT inhaler Inhale 2 puffs into the lungs every 6 (six) hours as needed for wheezing or shortness of breath.  (Patient not taking: Reported on 07/25/2020)    . oxyCODONE-acetaminophen (PERCOCET/ROXICET) 5-325 MG tablet TAKE 1 TABLET EVERY 8 HOURS AS NEEDED FOR SEVERE PAIN. (Patient not taking: Reported on 07/25/2020) 90 tablet 0  . prochlorperazine (COMPAZINE) 10 MG tablet Take 1 tablet (10 mg total) by mouth every  6 (six) hours as needed (Nausea or vomiting). (Patient not taking: Reported on 07/25/2020) 60 tablet 1   No current facility-administered medications for this visit.    ALLERGIES:  Allergies  Allergen Reactions  . Codeine Nausea Only  . Niaspan [Niacin Er]     PHYSICAL EXAM:  Performance status (ECOG): 1 - Symptomatic but completely ambulatory  Vitals:   07/25/20 1012  BP: (!) 116/49  Pulse: (!) 51  Resp: 17  Temp: (!) 97.1 F (36.2 C)  SpO2: 95%   Wt Readings from Last 3 Encounters:  07/25/20 149 lb 4.8 oz (67.7 kg)  07/04/20 149 lb 3.2 oz (67.7 kg)  06/13/20 148 lb (67.1 kg)   Physical Exam Vitals reviewed.  Constitutional:      Appearance: Normal appearance.  Cardiovascular:     Rate and Rhythm: Normal rate and regular rhythm.     Pulses: Normal pulses.     Heart sounds: Normal heart sounds.  Pulmonary:     Effort: Pulmonary effort is normal.     Breath sounds: Normal breath sounds.  Chest:  Breasts:     Right: Supraclavicular adenopathy (tender sub-cm LN) present.    Musculoskeletal:     Right lower leg: No edema.     Left lower leg: No edema.  Lymphadenopathy:     Cervical: No cervical adenopathy.     Upper Body:     Right upper body: Supraclavicular adenopathy (tender sub-cm LN) present.  Neurological:     General:  No focal deficit present.     Mental Status: He is alert and oriented to person, place, and time.  Psychiatric:        Mood and Affect: Mood normal.        Behavior: Behavior normal.     LABORATORY DATA:  I have reviewed the labs as listed.  CBC Latest Ref Rng & Units 07/25/2020 07/04/2020 06/13/2020  WBC 4.0 - 10.5 K/uL 3.6(L) 4.1 5.3  Hemoglobin 13.0 - 17.0 g/dL 10.7(L) 10.9(L) 10.7(L)  Hematocrit 39.0 - 52.0 % 33.5(L) 33.6(L) 33.0(L)  Platelets 150 - 400 K/uL 229 213 211   CMP Latest Ref Rng & Units 07/25/2020 07/04/2020 06/13/2020  Glucose 70 - 99 mg/dL 94 99 100(H)  BUN 8 - 23 mg/dL 12 18 18   Creatinine 0.61 - 1.24 mg/dL 0.94 0.92 0.95  Sodium 135 - 145 mmol/L 138 133(L) 134(L)  Potassium 3.5 - 5.1 mmol/L 4.3 4.1 4.2  Chloride 98 - 111 mmol/L 104 104 104  CO2 22 - 32 mmol/L 26 23 25   Calcium 8.9 - 10.3 mg/dL 8.9 8.8(L) 9.0  Total Protein 6.5 - 8.1 g/dL 6.7 6.7 6.8  Total Bilirubin 0.3 - 1.2 mg/dL 0.6 0.4 0.4  Alkaline Phos 38 - 126 U/L 51 46 49  AST 15 - 41 U/L 17 16 17   ALT 0 - 44 U/L 15 14 16     DIAGNOSTIC IMAGING:  I have independently reviewed the scans and discussed with the patient. No results found.   ASSESSMENT:  1. 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 -CT CAP on 12/15/2019 showed enlarging prevascular and right paratracheal lymph nodes. Stable left lower lobe lung nodules. -CT neck on 12/15/2019 showed malignant lymphadenopathy in the lower right posterior triangle and jugular chain. -Lurbinectidin 3.50m/kgstarted on 12/22/2019. -CT neck on 02/23/2020 showed marked reduction of lower right neck and right supraclavicular adenopathy. -CT CAP on 02/23/2020 showed treatment response with resolved mediastinal adenopathy. No evidence of new  or progressive metastatic disease. Scattered bilateral pulmonary nodules. -CT CAP from 05/17/2020 with no evidence of recurrence or metastatic disease. Stable irregular  pulmonary nodules bilaterally. -CT neck from 05/17/2020 showed unchanged 2.1 x 1.4 cm peripherally enhancing hypodense lesion within the left nasopharyngeal region, favored to be a necrotic lymph node. No pathologically enlarged lymph nodes in the neck.   PLAN:  1. Small cell lung cancer with liver and lung metastasis: -He is tolerating dose reduced chemotherapy very well. - He has few days of fatigue which improves. - Reviewed labs which showed normal LFTs.  White count is slightly low at 3.6 with normal ANC. - Proceed with same dose of chemotherapy today and in 3 weeks. - Physical examination showed very small lymph node in the right supraclavicular region. - RTC 6 weeks with repeat CT of the neck, chest, abdomen and pelvis with contrast.  2. Normocytic anemia: -Hemoglobin is 10.7.  No iron infusion needed.  3. Chronic pain: -Continue Percocet 1 and half tablets twice daily.  4. Peripheral neuropathy: -Continue gabapentin 600 mg 3 times a day.  5. Sleeping difficulty: -He is able to sleep only 4 hours with Vistaril. - We will start him on Remeron 15 mg at bedtime.  Told him to increase it to 30 mg at bedtime after 7 days if no improvement.  He will discontinue Vistaril.   Orders placed this encounter:  Orders Placed This Encounter  Procedures  . CT CHEST ABDOMEN PELVIS W CONTRAST  . CT SOFT TISSUE NECK W CONTRAST     Derek Jack, MD [place] [phone]   I, Milinda Antis, am acting as a scribe for Dr. Sanda Linger.  I, Derek Jack MD, have reviewed the above documentation for accuracy and completeness, and I agree with the above.

## 2020-07-25 NOTE — Progress Notes (Signed)
Patient assessed and labs reviewed by Dr. Katragadda. Okay to proceed with treatment. Primary RN and pharmacy aware. 

## 2020-07-25 NOTE — Patient Instructions (Signed)
[facility] Discharge Instructions for Patients Receiving Chemotherapy  Today you received the following chemotherapy agents   To help prevent nausea and vomiting after your treatment, we encourage you to take your nausea medication   If you develop nausea and vomiting that is not controlled by your nausea medication, call the clinic.   BELOW ARE SYMPTOMS THAT SHOULD BE REPORTED IMMEDIATELY:  *FEVER GREATER THAN 100.5 F  *CHILLS WITH OR WITHOUT FEVER  NAUSEA AND VOMITING THAT IS NOT CONTROLLED WITH YOUR NAUSEA MEDICATION  *UNUSUAL SHORTNESS OF BREATH  *UNUSUAL BRUISING OR BLEEDING  TENDERNESS IN MOUTH AND THROAT WITH OR WITHOUT PRESENCE OF ULCERS  *URINARY PROBLEMS  *BOWEL PROBLEMS  UNUSUAL RASH Items with * indicate a potential emergency and should be followed up as soon as possible.  Feel free to call the clinic should you have any questions or concerns. The clinic phone number is [phone].  Please show the CHEMO ALERT CARD at check-in to the Emergency Department and triage nurse.   

## 2020-07-25 NOTE — Progress Notes (Signed)
Patient presents today for Zepzelca infusion.  Vital signs within parameters for treatment.  Labs with in parameters for treatment.  Patient complains of neck soreness, no other complaints since last visit.  Message received from [facility]/Dr. Delton Coombes.  Patient okay for treatment.  Zepzelca infusion given today per MD orders.  Tolerated infusion without adverse affects.  Vital signs stable.  No complaints at this time.  Discharge from clinic ambulatory in stable condition.  Alert and oriented X 3.  Follow up with [facility] as scheduled.

## 2020-08-10 ENCOUNTER — Other Ambulatory Visit: Payer: Self-pay | Admitting: Medical

## 2020-08-10 ENCOUNTER — Other Ambulatory Visit (HOSPITAL_COMMUNITY): Payer: Self-pay

## 2020-08-11 ENCOUNTER — Other Ambulatory Visit (HOSPITAL_COMMUNITY): Payer: Self-pay | Admitting: Hematology

## 2020-08-13 ENCOUNTER — Other Ambulatory Visit (HOSPITAL_COMMUNITY): Payer: Self-pay

## 2020-08-13 ENCOUNTER — Other Ambulatory Visit (HOSPITAL_COMMUNITY): Payer: Self-pay | Admitting: *Deleted

## 2020-08-13 ENCOUNTER — Other Ambulatory Visit (HOSPITAL_COMMUNITY): Payer: Self-pay | Admitting: Hematology

## 2020-08-13 MED ORDER — MIRTAZAPINE 15 MG PO TABS: 30.0000 mg | ORAL_TABLET | Freq: Every day | ORAL | 1 refills | Status: AC

## 2020-08-13 NOTE — Telephone Encounter (Signed)
Dr. Raliegh Ip, This ended up in [facility].  Please approve or refuse.  Thanks! Laurence Compton, RN

## 2020-08-14 ENCOUNTER — Inpatient Hospital Stay (HOSPITAL_COMMUNITY): Payer: Medicare HMO | Attending: Hematology

## 2020-08-14 ENCOUNTER — Other Ambulatory Visit: Payer: Self-pay

## 2020-08-14 DIAGNOSIS — Z5111 Encounter for antineoplastic chemotherapy: Secondary | ICD-10-CM | POA: Diagnosis not present

## 2020-08-14 DIAGNOSIS — C770 Secondary and unspecified malignant neoplasm of lymph nodes of head, face and neck: Secondary | ICD-10-CM | POA: Insufficient documentation

## 2020-08-14 DIAGNOSIS — C3490 Malignant neoplasm of unspecified part of unspecified bronchus or lung: Secondary | ICD-10-CM

## 2020-08-14 DIAGNOSIS — C787 Secondary malignant neoplasm of liver and intrahepatic bile duct: Secondary | ICD-10-CM | POA: Insufficient documentation

## 2020-08-14 LAB — COMPREHENSIVE METABOLIC PANEL
ALT: 15 U/L (ref 0–44)
AST: 19 U/L (ref 15–41)
Albumin: 4 g/dL (ref 3.5–5.0)
Alkaline Phosphatase: 56 U/L (ref 38–126)
Anion gap: 6 (ref 5–15)
BUN: 23 mg/dL (ref 8–23)
CO2: 25 mmol/L (ref 22–32)
Calcium: 9.2 mg/dL (ref 8.9–10.3)
Chloride: 104 mmol/L (ref 98–111)
Creatinine, Ser: 0.96 mg/dL (ref 0.61–1.24)
GFR, Estimated: >60 mL/min (ref >60)
Glucose, Bld: 86 mg/dL (ref 70–99)
Potassium: 4 mmol/L (ref 3.5–5.1)
Sodium: 135 mmol/L (ref 135–145)
Total Bilirubin: 0.5 mg/dL (ref 0.3–1.2)
Total Protein: 7 g/dL (ref 6.5–8.1)

## 2020-08-14 LAB — CBC WITH DIFFERENTIAL/PLATELET
Abs Immature Granulocytes: 0.02 10*3/uL (ref 0.00–0.07)
Basophils Absolute: 0 10*3/uL (ref 0.0–0.1)
Basophils Relative: 1 %
Eosinophils Absolute: 0.1 10*3/uL (ref 0.0–0.5)
Eosinophils Relative: 3 %
HCT: 33.1 % — ABNORMAL LOW (ref 39.0–52.0)
Hemoglobin: 10.7 g/dL — ABNORMAL LOW (ref 13.0–17.0)
Immature Granulocytes: 1 %
Lymphocytes Relative: 40 %
Lymphs Abs: 1.3 10*3/uL (ref 0.7–4.0)
MCH: 32.4 pg (ref 26.0–34.0)
MCHC: 32.3 g/dL (ref 30.0–36.0)
MCV: 100.3 fL — ABNORMAL HIGH (ref 80.0–100.0)
Monocytes Absolute: 0.5 10*3/uL (ref 0.1–1.0)
Monocytes Relative: 14 %
Neutro Abs: 1.3 10*3/uL — ABNORMAL LOW (ref 1.7–7.7)
Neutrophils Relative %: 41 %
Platelets: 250 10*3/uL (ref 150–400)
RBC: 3.3 MIL/uL — ABNORMAL LOW (ref 4.22–5.81)
RDW: 19.6 % — ABNORMAL HIGH (ref 11.5–15.5)
WBC: 3.2 10*3/uL — ABNORMAL LOW (ref 4.0–10.5)
nRBC: 0 % (ref 0.0–0.2)

## 2020-08-14 LAB — MAGNESIUM: Magnesium: 2 mg/dL (ref 1.7–2.4)

## 2020-08-15 ENCOUNTER — Ambulatory Visit (HOSPITAL_COMMUNITY): Payer: Medicare HMO

## 2020-08-15 ENCOUNTER — Other Ambulatory Visit: Payer: Self-pay

## 2020-08-15 ENCOUNTER — Inpatient Hospital Stay (HOSPITAL_COMMUNITY): Payer: Medicare HMO

## 2020-08-15 ENCOUNTER — Ambulatory Visit (HOSPITAL_COMMUNITY): Payer: Medicare HMO | Admitting: Hematology

## 2020-08-15 NOTE — Progress Notes (Signed)
Patient presents today for Zepzelca.  Labs drawn 08/14/20.  ANC noted to be 1.3.  Vital signs within parameters for treatment.  No new complaints since last visit.  No treatment today per Dr. Delton Coombes.  Patient to come back next week for lab recheck and possible infusion.  No complaints at this time.  Discharge from clinic ambulatory in stable condition.  Alert and oriented X 3.  Follow up with [facility] as scheduled.

## 2020-08-16 ENCOUNTER — Ambulatory Visit: Payer: Medicare HMO | Attending: Internal Medicine

## 2020-08-16 DIAGNOSIS — Z23 Encounter for immunization: Secondary | ICD-10-CM

## 2020-08-16 NOTE — Progress Notes (Signed)
   Covid-19 Vaccination Clinic  Name:  [name]    MRN: [id number] DOB: June 25, 1951  08/16/2020  Mr. [name] was observed post Covid-19 immunization for 15 minutes without incident. He was provided with Vaccine Information Sheet and instruction to access the V-Safe system.   Mr. [name] was instructed to call 911 with any severe reactions post vaccine: Marland Kitchen Difficulty breathing  . Swelling of face and throat  . A fast heartbeat  . A bad rash all over body  . Dizziness and weakness   Immunizations Administered    Name Date Dose VIS Date Route   Moderna COVID-19 Vaccine 08/16/2020  1:18 PM 0.5 mL 05/02/2020 Intramuscular   Manufacturer: Moderna   Lot: [id number]   [place]: 60109-323-55

## 2020-08-17 ENCOUNTER — Other Ambulatory Visit: Payer: Self-pay

## 2020-08-17 DIAGNOSIS — F411 Generalized anxiety disorder: Secondary | ICD-10-CM

## 2020-08-17 MED ORDER — ALPRAZOLAM 1 MG PO TABS
ORAL_TABLET | ORAL | 0 refills | Status: DC
Start: 2020-08-17 — End: 2020-09-17

## 2020-08-17 NOTE — Telephone Encounter (Signed)
Pt calling wanting his controlled med.   Last seen 03/01/20 Last fill 06/22/20

## 2020-08-23 ENCOUNTER — Inpatient Hospital Stay (HOSPITAL_COMMUNITY): Payer: Medicare HMO

## 2020-08-23 ENCOUNTER — Other Ambulatory Visit: Payer: Self-pay

## 2020-08-23 VITALS — BP 125/76 | HR 62 | Temp 96.9°F | Resp 18 | Wt 152.6 lb

## 2020-08-23 DIAGNOSIS — C3490 Malignant neoplasm of unspecified part of unspecified bronchus or lung: Secondary | ICD-10-CM

## 2020-08-23 DIAGNOSIS — C787 Secondary malignant neoplasm of liver and intrahepatic bile duct: Secondary | ICD-10-CM | POA: Diagnosis not present

## 2020-08-23 DIAGNOSIS — Z5111 Encounter for antineoplastic chemotherapy: Secondary | ICD-10-CM | POA: Diagnosis not present

## 2020-08-23 DIAGNOSIS — C770 Secondary and unspecified malignant neoplasm of lymph nodes of head, face and neck: Secondary | ICD-10-CM | POA: Diagnosis not present

## 2020-08-23 LAB — COMPREHENSIVE METABOLIC PANEL
ALT: 16 U/L (ref 0–44)
AST: 19 U/L (ref 15–41)
Albumin: 4 g/dL (ref 3.5–5.0)
Alkaline Phosphatase: 57 U/L (ref 38–126)
Anion gap: 6 (ref 5–15)
BUN: 17 mg/dL (ref 8–23)
CO2: 28 mmol/L (ref 22–32)
Calcium: 9.1 mg/dL (ref 8.9–10.3)
Chloride: 103 mmol/L (ref 98–111)
Creatinine, Ser: 0.87 mg/dL (ref 0.61–1.24)
GFR, Estimated: >60 mL/min (ref >60)
Glucose, Bld: 102 mg/dL — ABNORMAL HIGH (ref 70–99)
Potassium: 4.2 mmol/L (ref 3.5–5.1)
Sodium: 137 mmol/L (ref 135–145)
Total Bilirubin: 0.6 mg/dL (ref 0.3–1.2)
Total Protein: 7 g/dL (ref 6.5–8.1)

## 2020-08-23 LAB — CBC WITH DIFFERENTIAL/PLATELET
Abs Immature Granulocytes: 0.03 10*3/uL (ref 0.00–0.07)
Basophils Absolute: 0 10*3/uL (ref 0.0–0.1)
Basophils Relative: 1 %
Eosinophils Absolute: 0.1 10*3/uL (ref 0.0–0.5)
Eosinophils Relative: 3 %
HCT: 33.9 % — ABNORMAL LOW (ref 39.0–52.0)
Hemoglobin: 10.8 g/dL — ABNORMAL LOW (ref 13.0–17.0)
Immature Granulocytes: 1 %
Lymphocytes Relative: 26 %
Lymphs Abs: 1.3 10*3/uL (ref 0.7–4.0)
MCH: 32 pg (ref 26.0–34.0)
MCHC: 31.9 g/dL (ref 30.0–36.0)
MCV: 100.3 fL — ABNORMAL HIGH (ref 80.0–100.0)
Monocytes Absolute: 0.5 10*3/uL (ref 0.1–1.0)
Monocytes Relative: 9 %
Neutro Abs: 3.2 10*3/uL (ref 1.7–7.7)
Neutrophils Relative %: 60 %
Platelets: 210 10*3/uL (ref 150–400)
RBC: 3.38 MIL/uL — ABNORMAL LOW (ref 4.22–5.81)
RDW: 19 % — ABNORMAL HIGH (ref 11.5–15.5)
WBC: 5.2 10*3/uL (ref 4.0–10.5)
nRBC: 0 % (ref 0.0–0.2)

## 2020-08-23 LAB — MAGNESIUM: Magnesium: 2.2 mg/dL (ref 1.7–2.4)

## 2020-08-23 MED ORDER — PALONOSETRON HCL INJECTION 0.25 MG/5ML
INTRAVENOUS | Status: AC
  Filled 2020-08-23: qty 5

## 2020-08-23 MED ORDER — HEPARIN SOD (PORK) LOCK FLUSH 100 UNIT/ML IV SOLN
500.0000 [IU] | Freq: Once | INTRAVENOUS | Status: AC | PRN
  Administered 2020-08-23: 500 [IU]

## 2020-08-23 MED ORDER — SODIUM CHLORIDE 0.9 % IV SOLN: Freq: Once | INTRAVENOUS | Status: AC

## 2020-08-23 MED ORDER — SODIUM CHLORIDE 0.9% FLUSH
10.0000 mL | INTRAVENOUS | Status: DC | PRN
  Administered 2020-08-23: 10 mL

## 2020-08-23 MED ORDER — PALONOSETRON HCL INJECTION 0.25 MG/5ML
0.2500 mg | Freq: Once | INTRAVENOUS | Status: AC
  Administered 2020-08-23: 0.25 mg via INTRAVENOUS

## 2020-08-23 MED ORDER — SODIUM CHLORIDE 0.9 % IV SOLN
10.0000 mg | Freq: Once | INTRAVENOUS | Status: AC
  Administered 2020-08-23: 10 mg via INTRAVENOUS
  Filled 2020-08-23: qty 10

## 2020-08-23 MED ORDER — SODIUM CHLORIDE 0.9 % IV SOLN
2.6000 mg/m2 | Freq: Once | INTRAVENOUS | Status: AC
  Administered 2020-08-23: 4.75 mg via INTRAVENOUS
  Filled 2020-08-23: qty 9.5

## 2020-08-23 NOTE — Patient Instructions (Signed)
[facility] Discharge Instructions for Patients Receiving Chemotherapy  Today you received the following chemotherapy agents   To help prevent nausea and vomiting after your treatment, we encourage you to take your nausea medication   If you develop nausea and vomiting that is not controlled by your nausea medication, call the clinic.   BELOW ARE SYMPTOMS THAT SHOULD BE REPORTED IMMEDIATELY:  *FEVER GREATER THAN 100.5 F  *CHILLS WITH OR WITHOUT FEVER  NAUSEA AND VOMITING THAT IS NOT CONTROLLED WITH YOUR NAUSEA MEDICATION  *UNUSUAL SHORTNESS OF BREATH  *UNUSUAL BRUISING OR BLEEDING  TENDERNESS IN MOUTH AND THROAT WITH OR WITHOUT PRESENCE OF ULCERS  *URINARY PROBLEMS  *BOWEL PROBLEMS  UNUSUAL RASH Items with * indicate a potential emergency and should be followed up as soon as possible.  Feel free to call the clinic should you have any questions or concerns. The clinic phone number is [phone].  Please show the CHEMO ALERT CARD at check-in to the Emergency Department and triage nurse.   

## 2020-08-23 NOTE — Progress Notes (Signed)
Patient presents today for Zepzelca.  Vital signs and labs within parameters for treatment.  No new complaints since last visit.  Treatment given today per MD orders.  Tolerated infusion without adverse affects.  Vital signs stable.  No complaints at this time.  Discharge from clinic ambulatory in stable condition.  Alert and oriented X 3.  Follow up with [facility] as scheduled.

## 2020-09-03 ENCOUNTER — Ambulatory Visit (HOSPITAL_COMMUNITY)
Admission: RE | Admit: 2020-09-03 | Discharge: 2020-09-03 | Disposition: A | Discharge status: Home / Self Care | Payer: Medicare HMO | Source: Ambulatory Visit | Attending: Hematology | Admitting: Hematology

## 2020-09-03 ENCOUNTER — Other Ambulatory Visit: Payer: Self-pay

## 2020-09-03 DIAGNOSIS — C3490 Malignant neoplasm of unspecified part of unspecified bronchus or lung: Secondary | ICD-10-CM | POA: Diagnosis not present

## 2020-09-03 DIAGNOSIS — I708 Atherosclerosis of other arteries: Secondary | ICD-10-CM | POA: Diagnosis not present

## 2020-09-03 DIAGNOSIS — I6523 Occlusion and stenosis of bilateral carotid arteries: Secondary | ICD-10-CM | POA: Diagnosis not present

## 2020-09-03 DIAGNOSIS — Z5111 Encounter for antineoplastic chemotherapy: Secondary | ICD-10-CM | POA: Diagnosis not present

## 2020-09-03 DIAGNOSIS — K5730 Diverticulosis of large intestine without perforation or abscess without bleeding: Secondary | ICD-10-CM | POA: Diagnosis not present

## 2020-09-03 DIAGNOSIS — J432 Centrilobular emphysema: Secondary | ICD-10-CM | POA: Diagnosis not present

## 2020-09-03 DIAGNOSIS — Z8505 Personal history of malignant neoplasm of liver: Secondary | ICD-10-CM | POA: Diagnosis not present

## 2020-09-03 DIAGNOSIS — I2510 Atherosclerotic heart disease of native coronary artery without angina pectoris: Secondary | ICD-10-CM | POA: Diagnosis not present

## 2020-09-03 DIAGNOSIS — I358 Other nonrheumatic aortic valve disorders: Secondary | ICD-10-CM | POA: Diagnosis not present

## 2020-09-03 DIAGNOSIS — Z85118 Personal history of other malignant neoplasm of bronchus and lung: Secondary | ICD-10-CM | POA: Diagnosis not present

## 2020-09-03 MED ORDER — IOHEXOL 300 MG/ML  SOLN
125.0000 mL | Freq: Once | INTRAMUSCULAR | Status: AC | PRN
  Administered 2020-09-03: 125 mL via INTRAVENOUS

## 2020-09-05 ENCOUNTER — Ambulatory Visit (HOSPITAL_COMMUNITY): Payer: Medicare HMO

## 2020-09-05 ENCOUNTER — Ambulatory Visit (HOSPITAL_COMMUNITY): Payer: Medicare HMO | Admitting: Hematology

## 2020-09-05 ENCOUNTER — Other Ambulatory Visit (HOSPITAL_COMMUNITY): Payer: Medicare HMO

## 2020-09-12 ENCOUNTER — Inpatient Hospital Stay (HOSPITAL_COMMUNITY): Payer: Medicare HMO | Attending: Hematology

## 2020-09-12 ENCOUNTER — Other Ambulatory Visit: Payer: Self-pay

## 2020-09-12 ENCOUNTER — Inpatient Hospital Stay (HOSPITAL_COMMUNITY): Payer: Medicare HMO

## 2020-09-12 ENCOUNTER — Inpatient Hospital Stay (HOSPITAL_BASED_OUTPATIENT_CLINIC_OR_DEPARTMENT_OTHER): Payer: Medicare HMO | Admitting: Hematology

## 2020-09-12 VITALS — BP 113/58 | HR 62 | Temp 97.2°F | Resp 18 | Wt 147.5 lb

## 2020-09-12 VITALS — BP 121/44 | HR 58 | Temp 97.3°F | Resp 18

## 2020-09-12 DIAGNOSIS — C787 Secondary malignant neoplasm of liver and intrahepatic bile duct: Secondary | ICD-10-CM | POA: Diagnosis not present

## 2020-09-12 DIAGNOSIS — C3490 Malignant neoplasm of unspecified part of unspecified bronchus or lung: Secondary | ICD-10-CM | POA: Diagnosis not present

## 2020-09-12 DIAGNOSIS — D649 Anemia, unspecified: Secondary | ICD-10-CM | POA: Diagnosis not present

## 2020-09-12 DIAGNOSIS — C770 Secondary and unspecified malignant neoplasm of lymph nodes of head, face and neck: Secondary | ICD-10-CM | POA: Diagnosis not present

## 2020-09-12 DIAGNOSIS — Z5111 Encounter for antineoplastic chemotherapy: Secondary | ICD-10-CM | POA: Diagnosis not present

## 2020-09-12 DIAGNOSIS — Z79899 Other long term (current) drug therapy: Secondary | ICD-10-CM | POA: Insufficient documentation

## 2020-09-12 LAB — COMPREHENSIVE METABOLIC PANEL
ALT: 14 U/L (ref 0–44)
AST: 18 U/L (ref 15–41)
Albumin: 3.8 g/dL (ref 3.5–5.0)
Alkaline Phosphatase: 50 U/L (ref 38–126)
Anion gap: 8 (ref 5–15)
BUN: 19 mg/dL (ref 8–23)
CO2: 25 mmol/L (ref 22–32)
Calcium: 8.8 mg/dL — ABNORMAL LOW (ref 8.9–10.3)
Chloride: 105 mmol/L (ref 98–111)
Creatinine, Ser: 0.95 mg/dL (ref 0.61–1.24)
GFR, Estimated: >60 mL/min (ref >60)
Glucose, Bld: 88 mg/dL (ref 70–99)
Potassium: 4.5 mmol/L (ref 3.5–5.1)
Sodium: 138 mmol/L (ref 135–145)
Total Bilirubin: 0.6 mg/dL (ref 0.3–1.2)
Total Protein: 6.8 g/dL (ref 6.5–8.1)

## 2020-09-12 LAB — CBC WITH DIFFERENTIAL/PLATELET
Abs Immature Granulocytes: 0.03 10*3/uL (ref 0.00–0.07)
Basophils Absolute: 0 10*3/uL (ref 0.0–0.1)
Basophils Relative: 1 %
Eosinophils Absolute: 0.1 10*3/uL (ref 0.0–0.5)
Eosinophils Relative: 3 %
HCT: 32.3 % — ABNORMAL LOW (ref 39.0–52.0)
Hemoglobin: 10.4 g/dL — ABNORMAL LOW (ref 13.0–17.0)
Immature Granulocytes: 1 %
Lymphocytes Relative: 30 %
Lymphs Abs: 1.2 10*3/uL (ref 0.7–4.0)
MCH: 32.5 pg (ref 26.0–34.0)
MCHC: 32.2 g/dL (ref 30.0–36.0)
MCV: 100.9 fL — ABNORMAL HIGH (ref 80.0–100.0)
Monocytes Absolute: 0.6 10*3/uL (ref 0.1–1.0)
Monocytes Relative: 14 %
Neutro Abs: 2.1 10*3/uL (ref 1.7–7.7)
Neutrophils Relative %: 51 %
Platelets: 206 10*3/uL (ref 150–400)
RBC: 3.2 MIL/uL — ABNORMAL LOW (ref 4.22–5.81)
RDW: 19 % — ABNORMAL HIGH (ref 11.5–15.5)
WBC: 4 10*3/uL (ref 4.0–10.5)
nRBC: 0 % (ref 0.0–0.2)

## 2020-09-12 LAB — MAGNESIUM: Magnesium: 2 mg/dL (ref 1.7–2.4)

## 2020-09-12 MED ORDER — HEPARIN SOD (PORK) LOCK FLUSH 100 UNIT/ML IV SOLN
500.0000 [IU] | Freq: Once | INTRAVENOUS | Status: AC | PRN
  Administered 2020-09-12: 500 [IU]

## 2020-09-12 MED ORDER — SODIUM CHLORIDE 0.9 % IV SOLN
2.8800 mg/m2 | Freq: Once | INTRAVENOUS | Status: AC
  Administered 2020-09-12: 5.25 mg via INTRAVENOUS
  Filled 2020-09-12: qty 10.5

## 2020-09-12 MED ORDER — HEPARIN SOD (PORK) LOCK FLUSH 100 UNIT/ML IV SOLN: 250.0000 [IU] | Freq: Once | INTRAVENOUS | Status: DC | PRN

## 2020-09-12 MED ORDER — SODIUM CHLORIDE 0.9% FLUSH: 3.0000 mL | INTRAVENOUS | Status: DC | PRN

## 2020-09-12 MED ORDER — PALONOSETRON HCL INJECTION 0.25 MG/5ML
0.2500 mg | Freq: Once | INTRAVENOUS | Status: AC
  Administered 2020-09-12: 0.25 mg via INTRAVENOUS
  Filled 2020-09-12: qty 5

## 2020-09-12 MED ORDER — SODIUM CHLORIDE 0.9 % IV SOLN: Freq: Once | INTRAVENOUS | Status: AC

## 2020-09-12 MED ORDER — SODIUM CHLORIDE 0.9 % IV SOLN
10.0000 mg | Freq: Once | INTRAVENOUS | Status: AC
  Administered 2020-09-12: 10 mg via INTRAVENOUS
  Filled 2020-09-12: qty 10

## 2020-09-12 MED ORDER — SODIUM CHLORIDE 0.9% FLUSH
10.0000 mL | INTRAVENOUS | Status: DC | PRN
  Administered 2020-09-12: 10 mL

## 2020-09-12 MED ORDER — ALTEPLASE 2 MG IJ SOLR: 2.0000 mg | Freq: Once | INTRAMUSCULAR | Status: DC | PRN

## 2020-09-12 NOTE — Progress Notes (Signed)
Patient was assessed by Dr. Delton Coombes and labs have been reviewed.  Patient is okay to proceed with treatment today dose 2.88 meters square. Primary RN and pharmacy aware.

## 2020-09-12 NOTE — Progress Notes (Signed)
[name], [place] [id number]   CLINIC:  Medical Oncology/Hematology  PCP:  [name], MD [address] 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 Bulky 5.4 cm right supraclavicular region malignant lymph node conglomeration with extracapsular extension. 2. Surrounding smaller abnormal right level 3 and level 5 lymph nodes, and the lymphadenopathy continues into the superior mediastinum, see Chest CT findings reported separately. 3. No other metastatic disease identified in the neck.   01/16/2017 Imaging   CT chest: IMPRESSION: 1. Extensive lymphadenopathy in the thorax and lower right cervical region, as discussed above. Primary differential considerations include lymphoma/leukemia or small cell carcinoma of the lung. Further evaluation a PET-CT could be considered to assess for additional sites of disease below the diaphragm if clinically appropriate. Additionally, ultrasound-guided biopsy of supraclavicular lymphadenopathy could be considered to establish a tissue diagnosis. 2. Indeterminate lesion in the periphery of segment 8 of the liver measuring 2.7 x 1.7 cm. Attention at time of follow-up PET-CT is recommended. 3. Aortic atherosclerosis, in addition to left main and 3 vessel coronary artery disease. Please note that although the presence of coronary artery calcium documents the presence of coronary artery disease, the severity of this disease and any potential stenosis cannot be assessed on this non-gated CT examination. Assessment for potential risk  factor modification, dietary therapy or pharmacologic therapy may be warranted, if clinically indicated. 4. There are calcifications of the aortic valve. Echocardiographic correlation for evaluation of potential valvular dysfunction may be warranted if clinically indicated. 5. Diffuse bronchial wall thickening with moderate centrilobular and paraseptal emphysema; imaging findings suggestive of underlying COPD.   02/03/2017 Initial Biopsy   (R) neck lymph node biopsy: SMALL CELL CARCINOMA (most likely lung primary).    02/03/2017 Miscellaneous   Port-a-cath attempted by IR; unable to place d/t enlarged SVC.    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 Staging for metastatic disease  EXAM: MRI HEAD WITHOUT AND WITH CONTRAST  TECHNIQUE: Multiplanar, multiecho pulse sequences of the brain and surrounding structures were obtained without and with intravenous contrast.  CONTRAST:  52m MULTIHANCE GADOBENATE DIMEGLUMINE 529 MG/ML IV SOLN  COMPARISON:  None.  FINDINGS: Brain: Negative for hydrocephalus. Cerebral volume normal for age. Small nonenhancing white matter hyperintensities consistent with mild chronic microvascular ischemia. No acute infarct. Negative for hemorrhage or mass or edema  Normal enhancement postcontrast infusion. No enhancing mass lesion. Leptomeningeal enhancement is normal.  Vascular: Normal arterial flow voids.  Normal venous enhancement  Skull and upper cervical spine: Negative  Sinuses/Orbits: Negative  Other: None  IMPRESSION: Negative for metastatic disease.  No acute abnormality.  Mild chronic white matter changes.   04/07/2017 Imaging    PET:  1. Marked reduction in size and metabolic activity of bulky  RIGHT supraclavicular adenopathy mediastinal lymphadenopathy. 2. Residual moderate activity remains within small RIGHT supraclavicular lymph node, RIGHT lower  paratracheal lymph node and RIGHT hilar lymph node. 3. Resolution of prevascular and internal mammary mediastinal metastatic hypermetabolic activity. 4. Resolution of metabolic activity associated with solitary RIGHT hepatic lobe liver metastasis. 5. No evidence of disease progression. 6. 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   05/27/2017 Imaging   MRI brain w/ and w/o contrast IMPRESSION: 1. No metastatic disease identified. 2. Increased nonspecific cerebral white matter signal changes since August. These are most commonly small vessel disease related. 3. New right maxillary sinusitis. Benign appearing retention cysts in the nasopharynx with trace mastoid effusions.   06/12/2017 Imaging   PET-CT IMPRESSION: 1. There are two new hypermetabolic nodules identified within both lower lobes measuring up to 3.1 cm. The appearance is nonspecific and may be inflammatory/infectious in etiology. Pulmonary metastatic disease cannot be excluded and short-term follow-up imaging in 3 months is advised to reassess these nodules. 2. Stable appearance of mild hypermetabolic activity associated with right paratracheal and right hilar lymph nodes. 3. Decrease in FDG uptake associated with index right supraclavicular lymph node. 4. 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 5. Aortic Atherosclerosis (ICD10-I70.0) and Emphysema (ICD10-J43.9).   08/24/2017 - 11/18/2019 Chemotherapy   The patient had pegfilgrastim (NEULASTA ONPRO KIT) injection 6 mg, 6 mg, Subcutaneous, Once, 30 of 31 cycles Administration: 6 mg (08/28/2017), 6 mg (09/18/2017), 6 mg  (10/09/2017), 6 mg (11/02/2017), 6 mg (11/20/2017), 6 mg (12/18/2017), 6 mg (01/15/2018), 6 mg (02/12/2018), 6 mg (03/12/2018), 6 mg (04/09/2018), 6 mg (05/07/2018), 6 mg (06/04/2018), 6 mg (07/02/2018), 6 mg (07/30/2018), 6 mg (08/27/2018), 6 mg (09/24/2018), 6 mg (10/29/2018), 6 mg (11/26/2018), 6 mg (12/24/2018), 6 mg (01/21/2019), 6 mg (02/18/2019), 6 mg (03/18/2019), 6 mg (04/15/2019), 6 mg (05/13/2019), 6 mg (06/17/2019), 6 mg (07/22/2019), 6 mg (08/19/2019), 6 mg (09/16/2019), 6 mg (10/21/2019), 6 mg (11/18/2019) topotecan (HYCAMTIN) 2.9 mg in sodium chloride 0.9 % 100 mL chemo infusion, 1.5 mg/m2 = 2.9 mg, Intravenous,  Once, 30 of 31 cycles Dose modification: 1.2 mg/m2 (80 % of original dose 1.5 mg/m2, Cycle 4, Reason: Dose Not Tolerated) Administration: 2.9 mg (08/24/2017), 2.9 mg (08/25/2017), 2.9 mg (08/28/2017), 2.9 mg (08/26/2017), 2.9 mg (08/27/2017), 2.9 mg (09/14/2017), 2.9 mg (09/15/2017), 2.9 mg (09/16/2017), 2.9 mg (09/17/2017), 2.9 mg (09/18/2017), 2.9 mg (10/05/2017), 2.9 mg (10/06/2017), 2.9 mg (10/07/2017), 2.9 mg (10/08/2017), 2.9 mg (10/09/2017), 2.3 mg (10/26/2017), 2.3 mg (10/27/2017), 2.3 mg (10/28/2017), 2.3 mg (10/29/2017), 2.3 mg (11/02/2017), 2.3 mg (11/16/2017), 2.3 mg (11/17/2017), 2.3 mg (11/18/2017), 2.3 mg (11/19/2017), 2.3 mg (11/20/2017), 2.3 mg (12/14/2017), 2.3 mg (12/15/2017), 2.3 mg (12/16/2017), 2.3 mg (12/17/2017), 2.3 mg (12/18/2017), 2.3 mg (01/11/2018), 2.3 mg (01/12/2018), 2.3 mg (01/13/2018), 2.3 mg (01/15/2018), 2.3 mg (02/08/2018), 2.3 mg (02/09/2018), 2.3 mg (02/10/2018), 2.3 mg (02/11/2018), 2.3 mg (02/12/2018), 2.3 mg (03/08/2018), 2.3 mg (03/09/2018), 2.3 mg (03/10/2018), 2.3 mg (03/11/2018), 2.3 mg (03/12/2018), 2.2 mg (04/05/2018), 2.2 mg (04/06/2018), 2.2 mg (04/07/2018), 2.2 mg (04/08/2018), 2.2 mg (04/09/2018), 2.2 mg (05/03/2018), 2.2 mg (05/04/2018), 2.2 mg (05/05/2018), 2.2 mg (05/06/2018), 2.2 mg (05/07/2018), 2.2 mg (05/31/2018), 2.2 mg (06/01/2018), 2.2 mg (06/02/2018), 2.2 mg (06/03/2018), 2.2 mg (06/04/2018), 2.2 mg (06/28/2018), 2.2 mg  (06/29/2018), 2.2 mg (06/30/2018), 2.2 mg (07/01/2018), 2.2 mg (07/02/2018), 2.2 mg (07/26/2018), 2.2 mg (07/27/2018), 2.2 mg (07/28/2018), 2.2 mg (07/29/2018), 2.2 mg (07/30/2018), 2.2 mg (08/23/2018), 2.2 mg (08/24/2018), 2.2 mg (08/25/2018), 2.2 mg (  08/26/2018), 2.2 mg (08/27/2018), 2.2 mg (09/20/2018), 2.2 mg (09/21/2018), 2.2 mg (09/22/2018), 2.2 mg (09/23/2018), 2.2 mg (09/24/2018), 2.2 mg (10/25/2018), 2.2 mg (10/26/2018), 2.2 mg (10/27/2018), 2.2 mg (10/28/2018), 2.2 mg (10/29/2018), 2.2 mg (11/22/2018), 2.2 mg (11/23/2018), 2.2 mg (11/24/2018), 2.2 mg (11/25/2018), 2.2 mg (11/26/2018), 2.2 mg (12/20/2018), 2.2 mg (12/21/2018), 2.2 mg (12/22/2018), 2.2 mg (12/23/2018), 2.2 mg (12/24/2018), 2.2 mg (01/17/2019), 2.2 mg (01/18/2019), 2.2 mg (01/19/2019), 2.2 mg (01/20/2019), 2.2 mg (01/21/2019), 2.2 mg (02/14/2019), 2.2 mg (02/15/2019), 2.2 mg (02/16/2019), 2.2 mg (02/17/2019), 2.2 mg (02/18/2019), 2.2 mg (03/14/2019), 2.2 mg (03/15/2019), 2.2 mg (03/16/2019), 2.2 mg (03/17/2019), 2.2 mg (03/18/2019), 2.2 mg (04/11/2019), 2.2 mg (04/12/2019), 2.2 mg (04/13/2019), 2.2 mg (04/14/2019), 2.2 mg (04/15/2019), 2.2 mg (05/09/2019), 2.2 mg (05/10/2019), 2.2 mg (05/11/2019), 2.2 mg (05/12/2019), 2.2 mg (05/13/2019), 2.2 mg (06/13/2019), 2.2 mg (06/14/2019), 2.2 mg (06/15/2019), 2.2 mg (06/16/2019), 2.2 mg (06/17/2019), 2.2 mg (07/18/2019), 2.2 mg (07/19/2019), 2.2 mg (07/20/2019), 2.2 mg (07/21/2019), 2.2 mg (07/22/2019), 2.2 mg (08/15/2019), 2.2 mg (08/16/2019), 2.2 mg (08/17/2019), 2.2 mg (08/18/2019), 2.2 mg (08/19/2019), 2.2 mg (09/12/2019), 2.2 mg (09/13/2019), 2.2 mg (09/14/2019), 2.2 mg (09/15/2019), 2.2 mg (09/16/2019), 2.2 mg (10/17/2019), 2.2 mg (10/18/2019), 2.2 mg (10/19/2019), 2.2 mg (10/20/2019), 2.2 mg (10/21/2019), 2.2 mg (11/14/2019), 2.2 mg (11/15/2019), 2.2 mg (11/16/2019), 2.2 mg (11/17/2019), 2.2 mg (11/18/2019) ondansetron (ZOFRAN) 4 mg in sodium chloride 0.9 % 50 mL IVPB, , Intravenous,  Once, 25 of 26 cycles Administration:  (02/08/2018),  (02/09/2018),  (02/10/2018),  (02/11/2018),  (02/12/2018),  (03/08/2018),   (03/09/2018),  (03/10/2018),  (03/11/2018),  (03/12/2018),  (04/05/2018),  (04/06/2018),  (04/07/2018),  (04/08/2018),  (04/09/2018),  (05/03/2018),  (05/04/2018),  (05/05/2018),  (05/06/2018),  (05/07/2018),  (05/31/2018),  (06/01/2018),  (06/02/2018),  (06/03/2018),  (06/04/2018),  (06/28/2018),  (06/29/2018),  (06/30/2018),  (07/01/2018),  (07/02/2018),  (07/26/2018),  (07/27/2018),  (07/28/2018),  (07/29/2018),  (07/30/2018),  (08/23/2018),  (08/24/2018),  (08/25/2018),  (08/26/2018),  (08/27/2018),  (09/20/2018),  (09/21/2018),  (09/22/2018),  (09/23/2018),  (09/24/2018),  (10/25/2018),  (10/26/2018),  (10/27/2018),  (10/28/2018),  (10/29/2018),  (11/22/2018),  (11/23/2018),  (11/24/2018),  (11/25/2018),  (11/26/2018),  (12/20/2018),  (12/21/2018),  (12/22/2018),  (12/23/2018),  (12/24/2018),  (01/17/2019),  (01/18/2019),  (01/19/2019),  (01/20/2019),  (01/21/2019),  (02/14/2019),  (02/15/2019),  (02/16/2019),  (02/17/2019),  (02/18/2019),  (03/14/2019),  (03/15/2019),  (03/16/2019),  (03/17/2019),  (03/18/2019),  (04/11/2019),  (04/12/2019),  (04/13/2019),  (04/14/2019),  (04/15/2019),  (05/09/2019),  (05/10/2019),  (05/11/2019),  (05/12/2019),  (05/13/2019),  (06/13/2019),  (06/14/2019),  (06/15/2019),  (06/16/2019),  (06/17/2019),  (07/18/2019),  (07/19/2019),  (07/20/2019),  (07/21/2019),  (07/22/2019),  (08/15/2019),  (08/16/2019),  (08/17/2019),  (08/18/2019),  (08/19/2019),  (09/12/2019),  (09/13/2019),  (09/14/2019),  (09/15/2019),  (09/16/2019),  (10/17/2019),  (10/18/2019),  (10/19/2019),  (10/20/2019),  (10/21/2019),  (11/14/2019),  (11/15/2019),  (11/16/2019),  (11/17/2019),  (11/18/2019)  for chemotherapy treatment.    12/22/2019 -  Chemotherapy    Patient is on Treatment Plan: LUNG SMALL CELL LURBINECTEDIN Q21D        CANCER STAGING: Cancer Staging No matching staging information was found for the patient.  INTERVAL HISTORY:  Mr. LAZLO TUNNEY, a 70 y.o. male, returns for routine follow-up and consideration for next cycle of chemotherapy. [place] was last seen on 07/25/2020.  Due for cycle #13 of  lurbinectedin and Aloxi today.   Overall, he tells me he has been feeling fair. He reports feeling more lymph nodes on the right side of his neck. Since the treatment dose was reduced, he has tolerated it better  and is not as sleepy during the day. He continues taking gabapentin. He continues having constant back pain and is taking Percocet BID.  Overall, he feels ready for next cycle of chemo today.    REVIEW OF SYSTEMS:  Review of Systems  Constitutional: Positive for appetite change (25%), fatigue (50%) and unexpected weight change (lost 5 lbs in 3 weeks).  Musculoskeletal: Positive for back pain (5/10 lower back pain).  Neurological: Positive for numbness (feet & behind knees).  Hematological: Positive for adenopathy (R side of neck).  All other systems reviewed and are negative.   PAST MEDICAL/SURGICAL HISTORY:  Past Medical History:  Diagnosis Date  . Anxiety   . CAD (coronary artery disease)   . Cancer ([place])    stage 4 small cell lung cancer   . COPD (chronic obstructive pulmonary disease) ([place])   . Depression   . Dyspnea    increased exertion  . Feeling of chest tightness   . Heart palpitations   . History of chemotherapy   . Myocardial infarction ([place])   . Osteopenia   . Panic attacks   . Smoker    Past Surgical History:  Procedure Laterality Date  . BACK SURGERY  12/24/2000   L5,S1  . CORONARY STENT PLACEMENT  2005   RCA & CX  . HERNIA REPAIR Right 1980's  . INGUINAL HERNIA REPAIR  12/1978   right side  . IR FLUORO GUIDE PORT INSERTION RIGHT  04/02/2017  . IR US GUIDE BX ASP/DRAIN  02/03/2017  . IR US GUIDE VASC ACCESS RIGHT  04/02/2017  . NM MYOCAR PERF WALL MOTION  09/07/2009   No ischemia; EF 51%  . SHOULDER SURGERY Left 08/2010  . SPINE SURGERY  2002   L5-S1    SOCIAL HISTORY:  Social History   Socioeconomic History  . Marital status: Single    Spouse name: Not on file  . Number of children: Not on file  . Years of education: Not on file  . Highest  education level: Not on file  Occupational History  . Not on file  Tobacco Use  . Smoking status: Current Every Day Smoker    Packs/day: 1.00    Types: Cigarettes  . Smokeless tobacco: Never Used  Vaping Use  . Vaping Use: Never used  Substance and Sexual Activity  . Alcohol use: Yes    Comment: occas  . Drug use: No  . Sexual activity: Not on file  Other Topics Concern  . Not on file  Social History Narrative  . Not on file   Social Determinants of Health   Financial Resource Strain: Low Risk   . Difficulty of Paying Living Expenses: Not hard at all  Food Insecurity: No Food Insecurity  . Worried About Charity fundraiser in the Last Year: Never true  . Ran Out of Food in the Last Year: Never true  Transportation Needs: No Transportation Needs  . Lack of Transportation (Medical): No  . Lack of Transportation (Non-Medical): No  Physical Activity: Inactive  . Days of Exercise per Week: 0 days  . Minutes of Exercise per Session: 0 min  Stress: No Stress Concern Present  . Feeling of Stress : Not at all  Social Connections: Socially Isolated  . Frequency of Communication with Friends and Family: More than three times a week  . Frequency of Social Gatherings with Friends and Family: More than three times a week  . Attends Religious Services: Never  . Active Member of  Clubs or Organizations: No  . Attends Archivist Meetings: Never  . Marital Status: Never married  Intimate Partner Violence: Not At Risk  . Fear of Current or Ex-Partner: No  . Emotionally Abused: No  . Physically Abused: No  . Sexually Abused: No    FAMILY HISTORY:  Family History  Problem Relation Age of Onset  . Heart attack Father   . Kidney disease Father        renal failure  . Heart failure Mother   . Heart attack Mother   . Cancer Brother   . Diabetes Brother   . Alcohol abuse Brother   . Diabetes Sister     CURRENT MEDICATIONS:  Current Outpatient Medications  Medication  Sig Dispense Refill  . albuterol (VENTOLIN HFA) 108 (90 Base) MCG/ACT inhaler Inhale 2 puffs into the lungs every 6 (six) hours as needed for wheezing or shortness of breath.    . ALPRAZolam (XANAX) 1 MG tablet TAKE (1) TABLET BY MOUTH (4) TIMES DAILY. 120 tablet 0  . Ascorbic Acid (VITAMIN C) 1000 MG tablet Take 1,000 mg by mouth daily.    Marland Kitchen aspirin EC 81 MG tablet Take 81 mg by mouth daily.    Marland Kitchen atorvastatin (LIPITOR) 40 MG tablet TAKE (1) TABLET BY MOUTH ONCE DAILY. 90 tablet 0  . calcium-vitamin D (OSCAL WITH D) 250-125 MG-UNIT tablet Take 1 tablet by mouth daily.    . cholecalciferol (VITAMIN D3) 25 MCG (1000 UT) tablet Take 1,000 Units by mouth daily.    Marland Kitchen docusate sodium (COLACE) 100 MG capsule Take two capsules at bedtime starting the day you receive chemotherapy and then for four days after 30 capsule 3  . gabapentin (NEURONTIN) 300 MG capsule TAKE (2) CAPSULES BY MOUTH THREE TIMES DAILY. 180 capsule 6  . hydrOXYzine (ATARAX/VISTARIL) 50 MG tablet TAKE ONE TABLET BY MOUTH AT BEDTIME AS NEEDED FOR SLEEP. 90 tablet 0  . mirtazapine (REMERON) 15 MG tablet Take 2 tablets (30 mg total) by mouth at bedtime. 30 tablet 1  . mirtazapine (REMERON) 15 MG tablet TAKE (1) TABLET BY MOUTH AT BEDTIME. 30 tablet 5  . mirtazapine (REMERON) 15 MG tablet TAKE (1) TABLET BY MOUTH AT BEDTIME. 30 tablet 0  . Multiple Vitamins-Minerals (CENTRUM SILVER 50+MEN) TABS Take 1 tablet by mouth daily.    . ondansetron (ZOFRAN) 8 MG tablet Take 1 tablet (8 mg total) by mouth 2 (two) times daily as needed. Start on the third day after cisplatin chemotherapy. 30 tablet 1  . oxyCODONE-acetaminophen (PERCOCET/ROXICET) 5-325 MG tablet TAKE 1 TABLET EVERY 8 HOURS AS NEEDED FOR SEVERE PAIN. 90 tablet 0  . oxyCODONE-acetaminophen (PERCOCET/ROXICET) 5-325 MG tablet TAKE 1 TABLET EVERY 8 HOURS AS NEEDED FOR SEVERE PAIN. 90 tablet 0  . oxyCODONE-acetaminophen (PERCOCET/ROXICET) 5-325 MG tablet TAKE 1 TABLET EVERY 8 HOURS AS NEEDED  FOR SEVERE PAIN. 90 tablet 0  . prochlorperazine (COMPAZINE) 10 MG tablet Take 1 tablet (10 mg total) by mouth every 6 (six) hours as needed (Nausea or vomiting). 60 tablet 1  . senna (SENOKOT) 8.6 MG tablet Take 3 tablets by mouth daily.     . vitamin B-12 (CYANOCOBALAMIN) 100 MCG tablet Take 100 mcg by mouth daily.     No current facility-administered medications for this visit.    ALLERGIES:  Allergies  Allergen Reactions  . Codeine Nausea Only  . Niaspan [Niacin Er]     PHYSICAL EXAM:  Performance status (ECOG): 1 - Symptomatic but completely ambulatory  Vitals:   09/12/20 1257  BP: (!) 113/58  Pulse: 62  Resp: 18  Temp: (!) 97.2 F (36.2 C)  SpO2: 96%   Wt Readings from Last 3 Encounters:  09/12/20 147 lb 8 oz (66.9 kg)  08/23/20 152 lb 9.6 oz (69.2 kg)  08/15/20 150 lb (68 kg)   Physical Exam Chest:     Comments: Port-a-Cath in R chest Lymphadenopathy:     Cervical: Cervical adenopathy present.     Right cervical: Superficial cervical adenopathy present.     LABORATORY DATA:  I have reviewed the labs as listed.  CBC Latest Ref Rng & Units 09/12/2020 08/23/2020 08/14/2020  WBC 4.0 - 10.5 K/uL 4.0 5.2 3.2(L)  Hemoglobin 13.0 - 17.0 g/dL 10.4(L) 10.8(L) 10.7(L)  Hematocrit 39.0 - 52.0 % 32.3(L) 33.9(L) 33.1(L)  Platelets 150 - 400 K/uL 206 210 250   CMP Latest Ref Rng & Units 09/12/2020 08/23/2020 08/14/2020  Glucose 70 - 99 mg/dL 88 102(H) 86  BUN 8 - 23 mg/dL _0 Creatinine 0.61 - 1.24 mg/dL 0.95 0.87 0.96  Sodium 135 - 145 mmol/L 138 137 135  Potassium 3.5 - 5.1 mmol/L 4.5 4.2 4.0  Chloride 98 - 111 mmol/L 105 103 104  CO2 22 - 32 mmol/L _1 Calcium 8.9 - 10.3 mg/dL 8.8(L) 9.1 9.2  Total Protein 6.5 - 8.1 g/dL 6.8 7.0 7.0  Total Bilirubin 0.3 - 1.2 mg/dL 0.6 0.6 0.5  Alkaline Phos 38 - 126 U/L 50 57 56  AST 15 - 41 U/L _2 ALT 0 - 44 U/L _3 DIAGNOSTIC IMAGING:  I have independently reviewed the scans and discussed with the  patient. 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 Lymphadenopathy, neck; small cell lung cancer. Additional history provided by scanning technologist: Follow-up metastatic lung cancer diagnosed in 2018, status post chemotherapy, right supraclavicular region malignant lymph node, assess treatment response, chemotherapy planning. EXAM: CT NECK WITH CONTRAST TECHNIQUE: Multidetector CT imaging of the neck was performed using the standard protocol following the bolus administration of intravenous contrast. CONTRAST:  128m OMNIPAQUE IOHEXOL 300 MG/ML  SOLN COMPARISON:  Prior neck CT examinations 05/17/2020 and earlier. FINDINGS: Pharynx and larynx: Unchanged 2.1 x 1.4 cm low-density focus in the left nasopharyngeal region, as described below under the lymph nodes section. No swelling or discrete mass is identified within the oral cavity, pharynx or larynx. Salivary glands: No inflammation, mass, or stone. Thyroid: Unremarkable. Lymph nodes: Lymph node within the right lower neck, overlying the right scalene muscles, is again mildly enlarged (now measuring 11 mm in short axis) (series 5, image 88) (series 3, image 78). This lymph node was not enlarged on the most recent prior scans of 05/17/2020 and 02/23/2020. However, this lymph node was enlarged on the prior neck CT of 12/15/2019. Unchanged 2.1 x 1.4 cm low-density focus within the left nasopharyngeal region, which may reflect necrotic retropharyngeal lymph nodes or a mucous retention cyst (series 3, image 24). No pathologically enlarged or abnormal appearing lymph nodes are identified elsewhere within the neck. Vascular: The major vascular structures of the neck are patent. Atherosclerotic plaque within the visualized aortic arch, proximal major branch vessels of the neck and carotid arteries. Limited intracranial: No acute intracranial abnormality identified. Visualized orbits:  Incompletely imaged. No mass or acute finding at the imaged levels. Mastoids and visualized paranasal sinuses: Trace bilateral  ethmoid sinus mucosal thickening. No significant mastoid effusion. Skeleton: Cervical spondylosis. No acute bony abnormality or aggressive osseous lesion identified Upper chest: Reported separately. These results will be called to the ordering clinician or representative by the Radiologist Assistant, and communication documented in the PACS or [employer]. IMPRESSION: A lymph node within the lower right neck (overlying the right scalene muscles) is again mildly enlarged, now measuring 11 mm in short axis. This lymph node was not enlarged on the most recent prior scans of 05/17/2020 or 02/23/2020, but was enlarged on the prior scan of 12/15/2019. Unchanged 2.1 x 1.4 cm low-density focus within the left nasopharyngeal region, which may reflect a necrotic lymph node or a mucous retention cyst. No lymphadenopathy identified elsewhere within the neck. Please refer to separately reported CT chest/abdomen/pelvis for description of intrathoracic findings. Electronically Signed   By: Kellie Simmering DO   On: 09/03/2020 16:35   CT CHEST ABDOMEN PELVIS W CONTRAST  Result Date: 09/04/2020 CLINICAL DATA:  70 year old male with history of small cell lung cancer with metastatic disease to the liver diagnosed in 2018, status post chemotherapy. Right supraclavicular region malignant lymphadenopathy. Follow-up study to assess response to treatment. EXAM: CT CHEST, ABDOMEN, AND PELVIS WITH CONTRAST TECHNIQUE: Multidetector CT imaging of the chest, abdomen and pelvis was performed following the standard protocol during bolus administration of intravenous contrast. CONTRAST:  142m OMNIPAQUE IOHEXOL 300 MG/ML  SOLN COMPARISON:  CT the chest, abdomen and pelvis 05/17/2020. FINDINGS: CT CHEST FINDINGS Cardiovascular: Heart size is normal. There is no significant pericardial fluid, thickening or pericardial  calcification. There is aortic atherosclerosis, as well as atherosclerosis of the great vessels of the mediastinum and the coronary arteries, including calcified atherosclerotic plaque in the left main, left anterior descending, left circumflex and right coronary arteries. Moderate calcifications of the aortic valve. Right internal jugular single-lumen porta cath with tip terminating at the superior cavoatrial junction. Mediastinum/Nodes: No pathologically enlarged mediastinal or hilar lymph nodes. Esophagus is unremarkable in appearance. No axillary lymphadenopathy. Lungs/Pleura: Multiple pulmonary nodules are again noted throughout the lungs bilaterally, stable in number and size compared to the prior examination. The largest of these is a macro lobular nodule in the right lower lobe (axial image 122 of series 4) measuring 1.8 x 1.0 cm (mean diameter of 1.4 cm). Other prominent nodules include a right lower lobe pulmonary nodule (axial image 118 of series 4) measuring 1.0 x 0.6 cm (mean diameter of 8 mm), and a left lower lobe pulmonary nodule (axial image 123 of series 4) measuring 1.0 x 0.4 cm (mean diameter of 7 mm). Several other smaller pulmonary nodules are noted. No new or enlarging suspicious appearing pulmonary nodules or masses are otherwise identified. No acute consolidative airspace disease. No pleural effusions. Diffuse bronchial wall thickening with moderate to severe centrilobular and paraseptal emphysema. Musculoskeletal: There are no aggressive appearing lytic or blastic lesions noted in the visualized portions of the skeleton. CT ABDOMEN PELVIS FINDINGS Hepatobiliary: Subcentimeter low-attenuation lesion in segment 4B of the liver, too small to characterize, but similar to the prior study and statistically likely to represent a cyst. No other suspicious appearing hepatic lesions. No intra or extrahepatic biliary ductal dilatation. Gallbladder is normal in appearance. Pancreas: No pancreatic mass.  No pancreatic ductal dilatation. No pancreatic or peripancreatic fluid collections or inflammatory changes. Spleen: Unremarkable. Adrenals/Urinary Tract: Low-attenuation lesions in the kidneys bilaterally, most of which are too small to definitively characterize, but statistically likely to represent tiny cysts. The largest of these lesions  are compatible with simple cysts, measuring up to 2.9 cm in the upper pole the left kidney. No other suspicious renal lesions. No hydroureteronephrosis. Small calcification associated with the non dependent anterior wall of the urinary bladder. Urinary bladder is otherwise unremarkable in appearance. Stomach/Bowel: The appearance of the stomach is normal. No pathologic dilatation of small bowel or colon. Numerous colonic diverticulae are noted, without surrounding inflammatory changes to suggest an acute diverticulitis at this time. Normal appendix. Vascular/Lymphatic: Aortic atherosclerosis, without evidence of aneurysm or dissection in the abdominal or pelvic vasculature. No lymphadenopathy noted in the abdomen or pelvis. Reproductive: Prostate gland and seminal vesicles are unremarkable in appearance. Other: No significant volume of ascites.  No pneumoperitoneum. Musculoskeletal: There are no aggressive appearing lytic or blastic lesions noted in the visualized portions of the skeleton. IMPRESSION: 1. Previously noted pulmonary nodules are stable in number and size compared to the prior examination, as detailed above. Continued attention on future follow-up imaging is recommended. 2. No signs of metastatic disease elsewhere in the chest, abdomen or pelvis. 3. Colonic diverticulosis without evidence of acute diverticulitis at this time. 4. Diffuse bronchial wall thickening with moderate to severe centrilobular and paraseptal emphysema; imaging findings suggestive of underlying COPD. 5. Aortic atherosclerosis, in addition to left main and 3 vessel coronary artery disease. Please  note that although the presence of coronary artery calcium documents the presence of coronary artery disease, the severity of this disease and any potential stenosis cannot be assessed on this non-gated CT examination. Assessment for potential risk factor modification, dietary therapy or pharmacologic therapy may be warranted, if clinically indicated. 6. There are calcifications of the aortic valve. Echocardiographic correlation for evaluation of potential valvular dysfunction may be warranted if clinically indicated. 7. Additional incidental findings, as above. Electronically Signed   By: Vinnie Langton M.D.   On: 09/04/2020 11:28     ASSESSMENT:  1. 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 -CT CAP on 12/15/2019 showed enlarging prevascular and right paratracheal lymph nodes. Stable left lower lobe lung nodules. -CT neck on 12/15/2019 showed malignant lymphadenopathy in the lower right posterior triangle and jugular chain. -Lurbinectidin 3.6m/kgstarted on 12/22/2019. -CT neck on 02/23/2020 showed marked reduction of lower right neck and right supraclavicular adenopathy. -CT CAP on 02/23/2020 showed treatment response with resolved mediastinal adenopathy. No evidence of new or progressive metastatic disease. Scattered bilateral pulmonary nodules. -CT CAP from 05/17/2020 with no evidence of recurrence or metastatic disease. Stable irregular pulmonary nodules bilaterally. -CT neck from 05/17/2020 showed unchanged 2.1 x 1.4 cm peripherally enhancing hypodense lesion within the left nasopharyngeal region, favored to be a necrotic lymph node. No pathologically enlarged lymph nodes in the neck.   PLAN:  1. 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  Previous scan did not show any adenopathy.  Lung nodules are stable.  No new  areas. -We discussed upfront radiation at this time versus reevaluating in 3 weeks. -We will increase Lurbi dose to 2.88 per metered squared.  He could not tolerate the 3.2 mg per metered squared dose. -If the right knee examination shows increase in size of the lymph node, will make a referral to radiation therapy.  2. Normocytic anemia: -Hemoglobin is 10.4 and stable.  No iron infusion needed.  3. Chronic pain: -Continue Percocet 1 and half tablets twice daily.  4. Peripheral neuropathy: -Continue  gabapentin 600 mg 3 times a day.  5. Sleeping difficulty: -Continue Remeron 30 mg at bedtime.   Orders placed this encounter:  No orders of the defined types were placed in this encounter.    Derek Jack, MD [place] [phone]   I, Milinda Antis, am acting as a scribe for Dr. Sanda Linger.  I, Derek Jack MD, have reviewed the above documentation for accuracy and completeness, and I agree with the above.

## 2020-09-12 NOTE — Patient Instructions (Addendum)
[place] Discharge Instructions for Patients Receiving Chemotherapy  Today you received the following chemotherapy agents zepzelca  To help prevent nausea and vomiting after your treatment, we encourage you to take your nausea medication    If you develop nausea and vomiting that is not controlled by your nausea medication, call the clinic.   BELOW ARE SYMPTOMS THAT SHOULD BE REPORTED IMMEDIATELY:  *FEVER GREATER THAN 100.5 F  *CHILLS WITH OR WITHOUT FEVER  NAUSEA AND VOMITING THAT IS NOT CONTROLLED WITH YOUR NAUSEA MEDICATION  *UNUSUAL SHORTNESS OF BREATH  *UNUSUAL BRUISING OR BLEEDING  TENDERNESS IN MOUTH AND THROAT WITH OR WITHOUT PRESENCE OF ULCERS  *URINARY PROBLEMS  *BOWEL PROBLEMS  UNUSUAL RASH Items with * indicate a potential emergency and should be followed up as soon as possible.  Feel free to call the clinic should you have any questions or concerns. The clinic phone number is [phone].  Please show the [facility] at check-in to the Emergency Department and triage nurse.

## 2020-09-12 NOTE — Progress Notes (Signed)
Pt here for D1C13 of Zepzelca.  Labs WNL for treatment today.  Okay for treatment today.   Patient stable during and after treatment today.  Tolerated treatment well today without incidence.  Discharged in stable condition ambulatory.  Vital sings stable prior to discharge.

## 2020-09-12 NOTE — Patient Instructions (Signed)
[facility] at [facility] Discharge Instructions  You were seen today by Dr. Katragadda. He went over your recent results and scans. You received your treatment today. Dr. Katragadda will see you back in 3 weeks for labs and follow up.   Thank you for choosing [facility] at [facility] to provide your oncology and hematology care.  To afford each patient quality time with our provider, please arrive at least 15 minutes before your scheduled appointment time.   If you have a lab appointment with the Cancer Center please come in thru the Main Entrance and check in at the main information desk  You need to re-schedule your appointment should you arrive 10 or more minutes late.  We strive to give you quality time with our providers, and arriving late affects you and other patients whose appointments are after yours.  Also, if you no show three or more times for appointments you may be dismissed from the clinic at the providers discretion.     Again, thank you for choosing [facility].  Our hope is that these requests will decrease the amount of time that you wait before being seen by our physicians.       _____________________________________________________________  Should you have questions after your visit to [facility], please contact our office at [phone] between the hours of 8:00 a.m. and 4:30 p.m.  Voicemails left after 4:00 p.m. will not be returned until the following business day.  For prescription refill requests, have your pharmacy contact our office and allow 72 hours.    Cancer Center Support Programs:   > Cancer Support Group  2nd Tuesday of the month 1pm-2pm, Journey Room    

## 2020-09-17 ENCOUNTER — Other Ambulatory Visit: Payer: Self-pay | Admitting: Family Medicine

## 2020-09-17 DIAGNOSIS — F411 Generalized anxiety disorder: Secondary | ICD-10-CM

## 2020-09-20 ENCOUNTER — Other Ambulatory Visit (HOSPITAL_COMMUNITY): Payer: Self-pay | Admitting: Hematology

## 2020-10-08 ENCOUNTER — Inpatient Hospital Stay (HOSPITAL_COMMUNITY): Payer: Medicare HMO

## 2020-10-08 ENCOUNTER — Inpatient Hospital Stay (HOSPITAL_COMMUNITY): Payer: Medicare HMO | Admitting: Hematology

## 2020-10-08 ENCOUNTER — Ambulatory Visit (HOSPITAL_COMMUNITY): Payer: Medicare HMO

## 2020-10-10 ENCOUNTER — Inpatient Hospital Stay (HOSPITAL_COMMUNITY)
Admission: EM | Admit: 2020-10-10 | Discharge: 2020-11-10 | DRG: 521 | Disposition: A | Discharge status: Skilled Nursing Facility | Payer: Medicare HMO | Attending: Student | Admitting: Student

## 2020-10-10 ENCOUNTER — Emergency Department (HOSPITAL_COMMUNITY): Payer: Medicare HMO

## 2020-10-10 ENCOUNTER — Other Ambulatory Visit: Payer: Self-pay

## 2020-10-10 ENCOUNTER — Encounter (HOSPITAL_COMMUNITY): Payer: Self-pay

## 2020-10-10 DIAGNOSIS — Z20822 Contact with and (suspected) exposure to covid-19: Secondary | ICD-10-CM | POA: Diagnosis present

## 2020-10-10 DIAGNOSIS — K5900 Constipation, unspecified: Secondary | ICD-10-CM | POA: Diagnosis not present

## 2020-10-10 DIAGNOSIS — C787 Secondary malignant neoplasm of liver and intrahepatic bile duct: Secondary | ICD-10-CM | POA: Diagnosis present

## 2020-10-10 DIAGNOSIS — R918 Other nonspecific abnormal finding of lung field: Secondary | ICD-10-CM | POA: Diagnosis not present

## 2020-10-10 DIAGNOSIS — R1312 Dysphagia, oropharyngeal phase: Secondary | ICD-10-CM | POA: Diagnosis present

## 2020-10-10 DIAGNOSIS — T148XXA Other injury of unspecified body region, initial encounter: Secondary | ICD-10-CM

## 2020-10-10 DIAGNOSIS — I10 Essential (primary) hypertension: Secondary | ICD-10-CM | POA: Diagnosis present

## 2020-10-10 DIAGNOSIS — S32038D Other fracture of third lumbar vertebra, subsequent encounter for fracture with routine healing: Secondary | ICD-10-CM | POA: Diagnosis not present

## 2020-10-10 DIAGNOSIS — K219 Gastro-esophageal reflux disease without esophagitis: Secondary | ICD-10-CM | POA: Diagnosis present

## 2020-10-10 DIAGNOSIS — R42 Dizziness and giddiness: Secondary | ICD-10-CM | POA: Diagnosis not present

## 2020-10-10 DIAGNOSIS — E43 Unspecified severe protein-calorie malnutrition: Secondary | ICD-10-CM | POA: Diagnosis present

## 2020-10-10 DIAGNOSIS — A419 Sepsis, unspecified organism: Secondary | ICD-10-CM | POA: Diagnosis not present

## 2020-10-10 DIAGNOSIS — Z66 Do not resuscitate: Secondary | ICD-10-CM | POA: Diagnosis not present

## 2020-10-10 DIAGNOSIS — Z471 Aftercare following joint replacement surgery: Secondary | ICD-10-CM | POA: Diagnosis not present

## 2020-10-10 DIAGNOSIS — S0219XA Other fracture of base of skull, initial encounter for closed fracture: Secondary | ICD-10-CM | POA: Diagnosis not present

## 2020-10-10 DIAGNOSIS — R0602 Shortness of breath: Secondary | ICD-10-CM | POA: Diagnosis not present

## 2020-10-10 DIAGNOSIS — Z833 Family history of diabetes mellitus: Secondary | ICD-10-CM

## 2020-10-10 DIAGNOSIS — J9601 Acute respiratory failure with hypoxia: Secondary | ICD-10-CM | POA: Diagnosis not present

## 2020-10-10 DIAGNOSIS — R9431 Abnormal electrocardiogram [ECG] [EKG]: Secondary | ICD-10-CM | POA: Diagnosis not present

## 2020-10-10 DIAGNOSIS — G9341 Metabolic encephalopathy: Secondary | ICD-10-CM | POA: Diagnosis not present

## 2020-10-10 DIAGNOSIS — R414 Neurologic neglect syndrome: Secondary | ICD-10-CM | POA: Diagnosis not present

## 2020-10-10 DIAGNOSIS — G9389 Other specified disorders of brain: Secondary | ICD-10-CM | POA: Diagnosis not present

## 2020-10-10 DIAGNOSIS — Z681 Body mass index (BMI) 19 or less, adult: Secondary | ICD-10-CM

## 2020-10-10 DIAGNOSIS — I951 Orthostatic hypotension: Secondary | ICD-10-CM | POA: Diagnosis present

## 2020-10-10 DIAGNOSIS — T17908A Unspecified foreign body in respiratory tract, part unspecified causing other injury, initial encounter: Secondary | ICD-10-CM | POA: Diagnosis not present

## 2020-10-10 DIAGNOSIS — M6281 Muscle weakness (generalized): Secondary | ICD-10-CM | POA: Diagnosis not present

## 2020-10-10 DIAGNOSIS — J189 Pneumonia, unspecified organism: Secondary | ICD-10-CM | POA: Diagnosis not present

## 2020-10-10 DIAGNOSIS — T796XXA Traumatic ischemia of muscle, initial encounter: Secondary | ICD-10-CM | POA: Diagnosis not present

## 2020-10-10 DIAGNOSIS — J439 Emphysema, unspecified: Secondary | ICD-10-CM | POA: Diagnosis not present

## 2020-10-10 DIAGNOSIS — M25552 Pain in left hip: Secondary | ICD-10-CM | POA: Diagnosis not present

## 2020-10-10 DIAGNOSIS — W19XXXA Unspecified fall, initial encounter: Secondary | ICD-10-CM | POA: Diagnosis present

## 2020-10-10 DIAGNOSIS — Z743 Need for continuous supervision: Secondary | ICD-10-CM | POA: Diagnosis not present

## 2020-10-10 DIAGNOSIS — L89156 Pressure-induced deep tissue damage of sacral region: Secondary | ICD-10-CM | POA: Diagnosis present

## 2020-10-10 DIAGNOSIS — Z7982 Long term (current) use of aspirin: Secondary | ICD-10-CM

## 2020-10-10 DIAGNOSIS — G919 Hydrocephalus, unspecified: Secondary | ICD-10-CM | POA: Diagnosis present

## 2020-10-10 DIAGNOSIS — E162 Hypoglycemia, unspecified: Secondary | ICD-10-CM | POA: Diagnosis not present

## 2020-10-10 DIAGNOSIS — C719 Malignant neoplasm of brain, unspecified: Secondary | ICD-10-CM | POA: Diagnosis not present

## 2020-10-10 DIAGNOSIS — G9340 Encephalopathy, unspecified: Secondary | ICD-10-CM | POA: Diagnosis not present

## 2020-10-10 DIAGNOSIS — R410 Disorientation, unspecified: Secondary | ICD-10-CM

## 2020-10-10 DIAGNOSIS — M6282 Rhabdomyolysis: Secondary | ICD-10-CM | POA: Diagnosis present

## 2020-10-10 DIAGNOSIS — R531 Weakness: Secondary | ICD-10-CM

## 2020-10-10 DIAGNOSIS — R0689 Other abnormalities of breathing: Secondary | ICD-10-CM | POA: Diagnosis not present

## 2020-10-10 DIAGNOSIS — R41841 Cognitive communication deficit: Secondary | ICD-10-CM | POA: Diagnosis not present

## 2020-10-10 DIAGNOSIS — S32039A Unspecified fracture of third lumbar vertebra, initial encounter for closed fracture: Secondary | ICD-10-CM | POA: Diagnosis not present

## 2020-10-10 DIAGNOSIS — R0603 Acute respiratory distress: Secondary | ICD-10-CM | POA: Diagnosis not present

## 2020-10-10 DIAGNOSIS — I2510 Atherosclerotic heart disease of native coronary artery without angina pectoris: Secondary | ICD-10-CM | POA: Diagnosis present

## 2020-10-10 DIAGNOSIS — E785 Hyperlipidemia, unspecified: Secondary | ICD-10-CM | POA: Diagnosis present

## 2020-10-10 DIAGNOSIS — J9690 Respiratory failure, unspecified, unspecified whether with hypoxia or hypercapnia: Secondary | ICD-10-CM

## 2020-10-10 DIAGNOSIS — R739 Hyperglycemia, unspecified: Secondary | ICD-10-CM | POA: Diagnosis not present

## 2020-10-10 DIAGNOSIS — Y92009 Unspecified place in unspecified non-institutional (private) residence as the place of occurrence of the external cause: Secondary | ICD-10-CM | POA: Diagnosis not present

## 2020-10-10 DIAGNOSIS — I252 Old myocardial infarction: Secondary | ICD-10-CM | POA: Diagnosis not present

## 2020-10-10 DIAGNOSIS — E876 Hypokalemia: Secondary | ICD-10-CM | POA: Diagnosis present

## 2020-10-10 DIAGNOSIS — H532 Diplopia: Secondary | ICD-10-CM | POA: Diagnosis not present

## 2020-10-10 DIAGNOSIS — R29818 Other symptoms and signs involving the nervous system: Secondary | ICD-10-CM | POA: Diagnosis not present

## 2020-10-10 DIAGNOSIS — J431 Panlobular emphysema: Secondary | ICD-10-CM

## 2020-10-10 DIAGNOSIS — J9811 Atelectasis: Secondary | ICD-10-CM | POA: Diagnosis not present

## 2020-10-10 DIAGNOSIS — Z789 Other specified health status: Secondary | ICD-10-CM

## 2020-10-10 DIAGNOSIS — I700 Atherosclerosis of aorta: Secondary | ICD-10-CM | POA: Diagnosis not present

## 2020-10-10 DIAGNOSIS — M533 Sacrococcygeal disorders, not elsewhere classified: Secondary | ICD-10-CM | POA: Diagnosis not present

## 2020-10-10 DIAGNOSIS — Z720 Tobacco use: Secondary | ICD-10-CM | POA: Diagnosis not present

## 2020-10-10 DIAGNOSIS — Z96642 Presence of left artificial hip joint: Secondary | ICD-10-CM | POA: Diagnosis not present

## 2020-10-10 DIAGNOSIS — S0292XD Unspecified fracture of facial bones, subsequent encounter for fracture with routine healing: Secondary | ICD-10-CM | POA: Diagnosis not present

## 2020-10-10 DIAGNOSIS — M47816 Spondylosis without myelopathy or radiculopathy, lumbar region: Secondary | ICD-10-CM | POA: Diagnosis not present

## 2020-10-10 DIAGNOSIS — D500 Iron deficiency anemia secondary to blood loss (chronic): Secondary | ICD-10-CM | POA: Diagnosis not present

## 2020-10-10 DIAGNOSIS — R0902 Hypoxemia: Secondary | ICD-10-CM

## 2020-10-10 DIAGNOSIS — Z781 Physical restraint status: Secondary | ICD-10-CM

## 2020-10-10 DIAGNOSIS — G894 Chronic pain syndrome: Secondary | ICD-10-CM | POA: Diagnosis present

## 2020-10-10 DIAGNOSIS — E7800 Pure hypercholesterolemia, unspecified: Secondary | ICD-10-CM | POA: Diagnosis not present

## 2020-10-10 DIAGNOSIS — T07XXXA Unspecified multiple injuries, initial encounter: Secondary | ICD-10-CM | POA: Diagnosis present

## 2020-10-10 DIAGNOSIS — D696 Thrombocytopenia, unspecified: Secondary | ICD-10-CM | POA: Diagnosis not present

## 2020-10-10 DIAGNOSIS — D638 Anemia in other chronic diseases classified elsewhere: Secondary | ICD-10-CM | POA: Diagnosis present

## 2020-10-10 DIAGNOSIS — S72002D Fracture of unspecified part of neck of left femur, subsequent encounter for closed fracture with routine healing: Secondary | ICD-10-CM | POA: Diagnosis not present

## 2020-10-10 DIAGNOSIS — F410 Panic disorder [episodic paroxysmal anxiety] without agoraphobia: Secondary | ICD-10-CM | POA: Diagnosis present

## 2020-10-10 DIAGNOSIS — Z841 Family history of disorders of kidney and ureter: Secondary | ICD-10-CM

## 2020-10-10 DIAGNOSIS — F05 Delirium due to known physiological condition: Secondary | ICD-10-CM | POA: Diagnosis not present

## 2020-10-10 DIAGNOSIS — S72002A Fracture of unspecified part of neck of left femur, initial encounter for closed fracture: Secondary | ICD-10-CM | POA: Diagnosis not present

## 2020-10-10 DIAGNOSIS — N281 Cyst of kidney, acquired: Secondary | ICD-10-CM | POA: Diagnosis not present

## 2020-10-10 DIAGNOSIS — R4182 Altered mental status, unspecified: Secondary | ICD-10-CM | POA: Diagnosis not present

## 2020-10-10 DIAGNOSIS — T17990A Other foreign object in respiratory tract, part unspecified in causing asphyxiation, initial encounter: Secondary | ICD-10-CM | POA: Diagnosis not present

## 2020-10-10 DIAGNOSIS — F32A Depression, unspecified: Secondary | ICD-10-CM | POA: Diagnosis present

## 2020-10-10 DIAGNOSIS — Z955 Presence of coronary angioplasty implant and graft: Secondary | ICD-10-CM | POA: Diagnosis not present

## 2020-10-10 DIAGNOSIS — R1310 Dysphagia, unspecified: Secondary | ICD-10-CM | POA: Diagnosis not present

## 2020-10-10 DIAGNOSIS — L89159 Pressure ulcer of sacral region, unspecified stage: Secondary | ICD-10-CM | POA: Diagnosis present

## 2020-10-10 DIAGNOSIS — Z96649 Presence of unspecified artificial hip joint: Secondary | ICD-10-CM

## 2020-10-10 DIAGNOSIS — F419 Anxiety disorder, unspecified: Secondary | ICD-10-CM | POA: Diagnosis present

## 2020-10-10 DIAGNOSIS — C3490 Malignant neoplasm of unspecified part of unspecified bronchus or lung: Secondary | ICD-10-CM | POA: Diagnosis not present

## 2020-10-10 DIAGNOSIS — R2689 Other abnormalities of gait and mobility: Secondary | ICD-10-CM | POA: Diagnosis not present

## 2020-10-10 DIAGNOSIS — S32029A Unspecified fracture of second lumbar vertebra, initial encounter for closed fracture: Secondary | ICD-10-CM | POA: Diagnosis present

## 2020-10-10 DIAGNOSIS — R55 Syncope and collapse: Secondary | ICD-10-CM | POA: Diagnosis not present

## 2020-10-10 DIAGNOSIS — L89616 Pressure-induced deep tissue damage of right heel: Secondary | ICD-10-CM | POA: Diagnosis not present

## 2020-10-10 DIAGNOSIS — J690 Pneumonitis due to inhalation of food and vomit: Secondary | ICD-10-CM | POA: Diagnosis not present

## 2020-10-10 DIAGNOSIS — Z8249 Family history of ischemic heart disease and other diseases of the circulatory system: Secondary | ICD-10-CM

## 2020-10-10 DIAGNOSIS — S32009A Unspecified fracture of unspecified lumbar vertebra, initial encounter for closed fracture: Secondary | ICD-10-CM | POA: Diagnosis present

## 2020-10-10 DIAGNOSIS — Z515 Encounter for palliative care: Secondary | ICD-10-CM | POA: Diagnosis not present

## 2020-10-10 DIAGNOSIS — F17210 Nicotine dependence, cigarettes, uncomplicated: Secondary | ICD-10-CM | POA: Diagnosis present

## 2020-10-10 DIAGNOSIS — H1132 Conjunctival hemorrhage, left eye: Secondary | ICD-10-CM | POA: Diagnosis not present

## 2020-10-10 DIAGNOSIS — R059 Cough, unspecified: Secondary | ICD-10-CM | POA: Diagnosis not present

## 2020-10-10 DIAGNOSIS — Z043 Encounter for examination and observation following other accident: Secondary | ICD-10-CM | POA: Diagnosis not present

## 2020-10-10 DIAGNOSIS — I959 Hypotension, unspecified: Secondary | ICD-10-CM | POA: Diagnosis not present

## 2020-10-10 DIAGNOSIS — S0990XA Unspecified injury of head, initial encounter: Secondary | ICD-10-CM | POA: Diagnosis not present

## 2020-10-10 DIAGNOSIS — R5381 Other malaise: Secondary | ICD-10-CM | POA: Diagnosis not present

## 2020-10-10 DIAGNOSIS — M6250 Muscle wasting and atrophy, not elsewhere classified, unspecified site: Secondary | ICD-10-CM | POA: Diagnosis present

## 2020-10-10 DIAGNOSIS — R278 Other lack of coordination: Secondary | ICD-10-CM | POA: Diagnosis not present

## 2020-10-10 DIAGNOSIS — R7401 Elevation of levels of liver transaminase levels: Secondary | ICD-10-CM | POA: Diagnosis present

## 2020-10-10 DIAGNOSIS — Z4682 Encounter for fitting and adjustment of non-vascular catheter: Secondary | ICD-10-CM | POA: Diagnosis not present

## 2020-10-10 DIAGNOSIS — R0600 Dyspnea, unspecified: Secondary | ICD-10-CM | POA: Diagnosis not present

## 2020-10-10 DIAGNOSIS — J449 Chronic obstructive pulmonary disease, unspecified: Secondary | ICD-10-CM | POA: Diagnosis not present

## 2020-10-10 DIAGNOSIS — R112 Nausea with vomiting, unspecified: Secondary | ICD-10-CM | POA: Diagnosis not present

## 2020-10-10 DIAGNOSIS — R1110 Vomiting, unspecified: Secondary | ICD-10-CM | POA: Diagnosis not present

## 2020-10-10 DIAGNOSIS — F411 Generalized anxiety disorder: Secondary | ICD-10-CM

## 2020-10-10 DIAGNOSIS — L8990 Pressure ulcer of unspecified site, unspecified stage: Secondary | ICD-10-CM | POA: Diagnosis present

## 2020-10-10 DIAGNOSIS — Z885 Allergy status to narcotic agent status: Secondary | ICD-10-CM

## 2020-10-10 DIAGNOSIS — R404 Transient alteration of awareness: Secondary | ICD-10-CM | POA: Diagnosis not present

## 2020-10-10 DIAGNOSIS — Z7189 Other specified counseling: Secondary | ICD-10-CM | POA: Diagnosis not present

## 2020-10-10 DIAGNOSIS — E860 Dehydration: Secondary | ICD-10-CM | POA: Diagnosis present

## 2020-10-10 DIAGNOSIS — R451 Restlessness and agitation: Secondary | ICD-10-CM | POA: Diagnosis not present

## 2020-10-10 DIAGNOSIS — Z888 Allergy status to other drugs, medicaments and biological substances status: Secondary | ICD-10-CM

## 2020-10-10 DIAGNOSIS — C7931 Secondary malignant neoplasm of brain: Secondary | ICD-10-CM | POA: Diagnosis not present

## 2020-10-10 DIAGNOSIS — K3189 Other diseases of stomach and duodenum: Secondary | ICD-10-CM | POA: Diagnosis not present

## 2020-10-10 DIAGNOSIS — R748 Abnormal levels of other serum enzymes: Secondary | ICD-10-CM | POA: Diagnosis present

## 2020-10-10 DIAGNOSIS — S72042A Displaced fracture of base of neck of left femur, initial encounter for closed fracture: Secondary | ICD-10-CM | POA: Diagnosis not present

## 2020-10-10 DIAGNOSIS — J9602 Acute respiratory failure with hypercapnia: Secondary | ICD-10-CM | POA: Diagnosis not present

## 2020-10-10 DIAGNOSIS — S0181XA Laceration without foreign body of other part of head, initial encounter: Secondary | ICD-10-CM | POA: Diagnosis present

## 2020-10-10 DIAGNOSIS — J9600 Acute respiratory failure, unspecified whether with hypoxia or hypercapnia: Secondary | ICD-10-CM

## 2020-10-10 DIAGNOSIS — R64 Cachexia: Secondary | ICD-10-CM | POA: Diagnosis present

## 2020-10-10 DIAGNOSIS — R197 Diarrhea, unspecified: Secondary | ICD-10-CM | POA: Diagnosis not present

## 2020-10-10 DIAGNOSIS — S32038A Other fracture of third lumbar vertebra, initial encounter for closed fracture: Secondary | ICD-10-CM | POA: Diagnosis not present

## 2020-10-10 DIAGNOSIS — J9611 Chronic respiratory failure with hypoxia: Secondary | ICD-10-CM | POA: Diagnosis not present

## 2020-10-10 DIAGNOSIS — Z79899 Other long term (current) drug therapy: Secondary | ICD-10-CM

## 2020-10-10 DIAGNOSIS — R279 Unspecified lack of coordination: Secondary | ICD-10-CM | POA: Diagnosis not present

## 2020-10-10 DIAGNOSIS — R339 Retention of urine, unspecified: Secondary | ICD-10-CM | POA: Diagnosis not present

## 2020-10-10 LAB — COMPREHENSIVE METABOLIC PANEL
ALT: 117 U/L — ABNORMAL HIGH (ref 0–44)
AST: 209 U/L — ABNORMAL HIGH (ref 15–41)
Albumin: 4.2 g/dL (ref 3.5–5.0)
Alkaline Phosphatase: 60 U/L (ref 38–126)
Anion gap: 13 (ref 5–15)
BUN: 50 mg/dL — ABNORMAL HIGH (ref 8–23)
CO2: 24 mmol/L (ref 22–32)
Calcium: 10 mg/dL (ref 8.9–10.3)
Chloride: 106 mmol/L (ref 98–111)
Creatinine, Ser: 1.14 mg/dL (ref 0.61–1.24)
GFR, Estimated: >60 mL/min (ref >60)
Glucose, Bld: 120 mg/dL — ABNORMAL HIGH (ref 70–99)
Potassium: 3.4 mmol/L — ABNORMAL LOW (ref 3.5–5.1)
Sodium: 143 mmol/L (ref 135–145)
Total Bilirubin: 1.3 mg/dL — ABNORMAL HIGH (ref 0.3–1.2)
Total Protein: 7.4 g/dL (ref 6.5–8.1)

## 2020-10-10 LAB — URINALYSIS, ROUTINE W REFLEX MICROSCOPIC
Bilirubin Urine: NEGATIVE
Glucose, UA: NEGATIVE mg/dL
Ketones, ur: 20 mg/dL — AB
Leukocytes,Ua: NEGATIVE
Nitrite: NEGATIVE
Protein, ur: 100 mg/dL — AB
Specific Gravity, Urine: 1.029 (ref 1.005–1.030)
pH: 5 (ref 5.0–8.0)

## 2020-10-10 LAB — CBC
HCT: 38.3 % — ABNORMAL LOW (ref 39.0–52.0)
Hemoglobin: 12.6 g/dL — ABNORMAL LOW (ref 13.0–17.0)
MCH: 31.7 pg (ref 26.0–34.0)
MCHC: 32.9 g/dL (ref 30.0–36.0)
MCV: 96.5 fL (ref 80.0–100.0)
Platelets: 166 10*3/uL (ref 150–400)
RBC: 3.97 MIL/uL — ABNORMAL LOW (ref 4.22–5.81)
RDW: 19.2 % — ABNORMAL HIGH (ref 11.5–15.5)
WBC: 11.8 10*3/uL — ABNORMAL HIGH (ref 4.0–10.5)
nRBC: 0 % (ref 0.0–0.2)

## 2020-10-10 LAB — PROTIME-INR
INR: 1.2 (ref 0.8–1.2)
Prothrombin Time: 14.6 seconds (ref 11.4–15.2)

## 2020-10-10 LAB — RESP PANEL BY RT-PCR (FLU A&B, COVID) ARPGX2
Influenza A by PCR: NEGATIVE
Influenza B by PCR: NEGATIVE
SARS Coronavirus 2 by RT PCR: NEGATIVE

## 2020-10-10 LAB — LIPASE, BLOOD: Lipase: 27 U/L (ref 11–51)

## 2020-10-10 LAB — CBG MONITORING, ED: Glucose-Capillary: 117 mg/dL — ABNORMAL HIGH (ref 70–99)

## 2020-10-10 LAB — LACTIC ACID, PLASMA
Lactic Acid, Venous: 2.6 mmol/L (ref 0.5–1.9)
Lactic Acid, Venous: 2.8 mmol/L (ref 0.5–1.9)

## 2020-10-10 LAB — CK: Total CK: 4232 U/L — ABNORMAL HIGH (ref 49–397)

## 2020-10-10 LAB — MAGNESIUM: Magnesium: 2.4 mg/dL (ref 1.7–2.4)

## 2020-10-10 MED ORDER — LORAZEPAM 2 MG/ML IJ SOLN
0.5000 mg | Freq: Once | INTRAMUSCULAR | Status: AC
  Administered 2020-10-10: 0.5 mg via INTRAVENOUS
  Filled 2020-10-10: qty 1

## 2020-10-10 MED ORDER — IOHEXOL 300 MG/ML  SOLN
80.0000 mL | Freq: Once | INTRAMUSCULAR | Status: AC | PRN
  Administered 2020-10-10: 80 mL via INTRAVENOUS

## 2020-10-10 MED ORDER — VANCOMYCIN HCL 1250 MG/250ML IV SOLN
1250.0000 mg | Freq: Once | INTRAVENOUS | Status: AC
  Administered 2020-10-10: 1250 mg via INTRAVENOUS
  Filled 2020-10-10: qty 250

## 2020-10-10 MED ORDER — SODIUM CHLORIDE 0.9 % IV SOLN
2.0000 g | Freq: Two times a day (BID) | INTRAVENOUS | Status: DC
  Administered 2020-10-10 – 2020-10-11 (×2): 2 g via INTRAVENOUS
  Filled 2020-10-10 (×2): qty 2

## 2020-10-10 MED ORDER — POLYETHYLENE GLYCOL 3350 17 G PO PACK
17.0000 g | PACK | Freq: Every day | ORAL | Status: DC | PRN
  Administered 2020-10-13: 17 g via ORAL
  Filled 2020-10-10: qty 1

## 2020-10-10 MED ORDER — POTASSIUM CHLORIDE IN NACL 20-0.45 MEQ/L-% IV SOLN
INTRAVENOUS | Status: DC
  Filled 2020-10-10 (×7): qty 1000

## 2020-10-10 MED ORDER — ACETAMINOPHEN 650 MG RE SUPP: 650.0000 mg | Freq: Four times a day (QID) | RECTAL | Status: DC | PRN

## 2020-10-10 MED ORDER — SODIUM CHLORIDE 0.9 % IV BOLUS
1000.0000 mL | Freq: Once | INTRAVENOUS | Status: AC
  Administered 2020-10-10: 1000 mL via INTRAVENOUS

## 2020-10-10 MED ORDER — HYDROMORPHONE HCL 1 MG/ML IJ SOLN
0.5000 mg | INTRAMUSCULAR | Status: DC | PRN
  Administered 2020-10-12: 1 mg via INTRAVENOUS
  Administered 2020-10-12: 0.5 mg via INTRAVENOUS
  Administered 2020-10-13: 1 mg via INTRAVENOUS
  Filled 2020-10-10 (×3): qty 1

## 2020-10-10 MED ORDER — ACETAMINOPHEN 325 MG PO TABS
650.0000 mg | ORAL_TABLET | Freq: Four times a day (QID) | ORAL | Status: DC | PRN
  Filled 2020-10-10: qty 2

## 2020-10-10 MED ORDER — VANCOMYCIN HCL 1250 MG/250ML IV SOLN
1250.0000 mg | INTRAVENOUS | Status: DC
  Administered 2020-10-11: 1250 mg via INTRAVENOUS
  Filled 2020-10-10 (×2): qty 250

## 2020-10-10 MED ORDER — IOHEXOL 350 MG/ML SOLN
75.0000 mL | Freq: Once | INTRAVENOUS | Status: AC | PRN
  Administered 2020-10-10: 75 mL via INTRAVENOUS

## 2020-10-10 NOTE — ED Notes (Signed)
Attempted a covid swab patient would not sit still and was swatting at nurse MD wants to wait until C spine is cleared

## 2020-10-10 NOTE — Progress Notes (Signed)
RN gave meds, Pt tried coming off the MRI table while being put into the machine. A safety belt and warm blanket was applied to help calm Pt. Pt was saying "let me out." Pt wouldn't calm down and was sent back to ED for his safety.

## 2020-10-10 NOTE — ED Notes (Signed)
Md ortiz floor coverage paged for pts critical lactic of 2.8

## 2020-10-10 NOTE — ED Notes (Signed)
Sent page to Bartholome Bill [phone]

## 2020-10-10 NOTE — ED Notes (Signed)
MD at bedside. 

## 2020-10-10 NOTE — ED Notes (Signed)
5N is still cleaning pt, pt to moved when room is cleaned.

## 2020-10-10 NOTE — ED Triage Notes (Signed)
BIB EMS after brother couldn't reach him for over 24 hours.  Went to check on him and found him in the floor.  Reports he fell in bathroom and crawled approx 40 feet.  Lac to left eyebrow with bruisng and complains of left hip pain. Patient is a cancer patient. Patient is alert but not oriented.

## 2020-10-10 NOTE — ED Notes (Signed)
Md tegeler notified of pts lactic of 2.8

## 2020-10-10 NOTE — ED Notes (Signed)
Transported to CT 

## 2020-10-10 NOTE — Progress Notes (Signed)
Consult request received for displaced left femoral neck fracture. I have reviewed x-rays and developed a provisional plan for hemi or total arthroplasty.  Full consultation to follow in the am and defined plan to follow in am. NPO post MN.  Altamese [place], MD [facility], [facility] [phone]

## 2020-10-10 NOTE — ED Notes (Signed)
Pt placed on 2L [place] for sats of 88% while resting

## 2020-10-10 NOTE — ED Notes (Signed)
Pt urinated on bed and self. Cleaned pt up and provided new sheets as well as a condom cath. Pt was swinging at staff during cleaning, as well as pulling at condom cath. Pt was also ripping off oxygen.

## 2020-10-10 NOTE — ED Provider Notes (Signed)
[facility] EMERGENCY DEPARTMENT Provider Note   CSN: [id number] Arrival date & time: 10/10/20  1534     History Chief Complaint  Patient presents with  . Fall  . Hip Pain  . Altered Mental Status    MARKHAM DUMLAO is a 70 y.o. male.  The history is provided by the EMS personnel and a relative. The history is limited by the condition of the patient. No language interpreter was used.  Fall This is a new problem. The current episode started more than 2 days ago (uncertain). The problem has not changed since onset.Associated symptoms include abdominal pain and headaches. Pertinent negatives include no chest pain and no shortness of breath. He has tried nothing for the symptoms. The treatment provided no relief.       Past Medical History:  Diagnosis Date  . Anxiety   . CAD (coronary artery disease)   . Cancer ([place])    stage 4 small cell lung cancer   . COPD (chronic obstructive pulmonary disease) ([place])   . Depression   . Dyspnea    increased exertion  . Feeling of chest tightness   . Heart palpitations   . History of chemotherapy   . Myocardial infarction ([place])   . Osteopenia   . Panic attacks   . Smoker     Patient Active Problem List   Diagnosis Date Noted  . Goals of care, counseling/discussion 12/19/2019  . Iron deficiency anemia 09/12/2019  . 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 Lymphadenopathy of head and neck 01/22/2017  . Osteopenia 03/30/2015  . Clubbing of fingers 04/14/2014  . Hyperglycemia 10/20/2013  . Smoker   . Closed dislocation of acromioclavicular joint 04/11/2010  . BREAST MASS, LEFT 01/20/2008  . ONYCHOMYCOSIS, TOENAILS 10/20/2007  . MUSCLE CRAMPS 10/20/2007  . Hypercholesterolemia 05/28/2007  . CATARACT NOS 08/28/2006  . DISTURBANCE, VISUAL NOS 08/19/2006  . COPD (chronic obstructive pulmonary disease) with emphysema ([place]) 08/19/2006  . WITHDRAWAL, DRUG 06/24/2006  . ANXIETY  06/24/2006  . DEPRESSION 06/24/2006  . MYOCARDIAL INFARCTION, HX OF 06/24/2006  . Coronary atherosclerosis 06/24/2006  . CARDIAC ARRHYTHMIA 06/24/2006  . CONSTIPATION 06/24/2006  . OSTEOARTHRITIS 06/24/2006  . LOW BACK PAIN 06/24/2006  . INSOMNIA 06/24/2006    Past Surgical History:  Procedure Laterality Date  . BACK SURGERY  12/24/2000   L5,S1  . CORONARY STENT PLACEMENT  2005   RCA & CX  . HERNIA REPAIR Right 1980's  . INGUINAL HERNIA REPAIR  12/1978   right side  . IR FLUORO GUIDE PORT INSERTION RIGHT  04/02/2017  . IR US GUIDE BX ASP/DRAIN  02/03/2017  . IR US GUIDE VASC ACCESS RIGHT  04/02/2017  . NM MYOCAR PERF WALL MOTION  09/07/2009   No ischemia; EF 51%  . SHOULDER SURGERY Left 08/2010  . SPINE SURGERY  2002   L5-S1       Family History  Problem Relation Age of Onset  . Heart attack Father   . Kidney disease Father        renal failure  . Heart failure Mother   . Heart attack Mother   . Cancer Brother   . Diabetes Brother   . Alcohol abuse Brother   . Diabetes Sister     Social History   Tobacco Use  . Smoking status: Current Every Day Smoker    Packs/day: 1.00    Types: Cigarettes  . Smokeless tobacco: Never Used  Vaping Use  .  Vaping Use: Never used  Substance Use Topics  . Alcohol use: Yes    Comment: occas  . Drug use: No    Home Medications Prior to Admission medications   Medication Sig Start Date End Date Taking? Authorizing Provider  albuterol (VENTOLIN HFA) 108 (90 Base) MCG/ACT inhaler Inhale 2 puffs into the lungs every 6 (six) hours as needed for wheezing or shortness of breath.    [provider]  ALPRAZolam (XANAX) 1 MG tablet TAKE (1) TABLET BY MOUTH (4) TIMES DAILY. 09/17/20   [name], MD  Ascorbic Acid (VITAMIN C) 1000 MG tablet Take 1,000 mg by mouth daily.    [provider]  aspirin EC 81 MG tablet Take 81 mg by mouth daily.    [provider]  atorvastatin (LIPITOR) 40 MG tablet TAKE (1)  TABLET BY MOUTH ONCE DAILY. 06/22/20   [name], MD  calcium-vitamin D (OSCAL WITH D) 250-125 MG-UNIT tablet Take 1 tablet by mouth daily.    [provider]  cholecalciferol (VITAMIN D3) 25 MCG (1000 UT) tablet Take 1,000 Units by mouth daily.    [provider]  docusate sodium (COLACE) 100 MG capsule Take two capsules at bedtime starting the day you receive chemotherapy and then for four days after 12/29/19   Derek Jack, MD  gabapentin (NEURONTIN) 300 MG capsule TAKE (2) CAPSULES BY MOUTH THREE TIMES DAILY. 04/23/20   Derek Jack, MD  hydrOXYzine (ATARAX/VISTARIL) 50 MG tablet TAKE ONE TABLET BY MOUTH AT BEDTIME AS NEEDED FOR SLEEP. 07/02/20   [name], MD  mirtazapine (REMERON) 15 MG tablet Take 2 tablets (30 mg total) by mouth at bedtime. 08/13/20   Derek Jack, MD  mirtazapine (REMERON) 15 MG tablet TAKE (1) TABLET BY MOUTH AT BEDTIME. 08/13/20   Derek Jack, MD  mirtazapine (REMERON) 15 MG tablet TAKE (1) TABLET BY MOUTH AT BEDTIME. 08/13/20   Derek Jack, MD  Multiple Vitamins-Minerals (CENTRUM SILVER 50+MEN) TABS Take 1 tablet by mouth daily.    [provider]  ondansetron (ZOFRAN) 8 MG tablet Take 1 tablet (8 mg total) by mouth 2 (two) times daily as needed. Start on the third day after cisplatin chemotherapy. 10/05/17   Holley Bouche, NP  oxyCODONE-acetaminophen (PERCOCET/ROXICET) 5-325 MG tablet TAKE 1 TABLET EVERY 8 HOURS AS NEEDED FOR SEVERE PAIN. 07/05/20   Benay Pike, MD  oxyCODONE-acetaminophen (PERCOCET/ROXICET) 5-325 MG tablet TAKE 1 TABLET EVERY 8 HOURS AS NEEDED FOR SEVERE PAIN. 07/04/20   Harle Stanford., PA-C  oxyCODONE-acetaminophen (PERCOCET/ROXICET) 5-325 MG tablet TAKE 1 TABLET EVERY 8 HOURS AS NEEDED FOR SEVERE PAIN. 08/13/20   Derek Jack, MD  oxyCODONE-acetaminophen (PERCOCET/ROXICET) 5-325 MG tablet TAKE 1 TABLET EVERY 8 HOURS AS NEEDED FOR SEVERE PAIN. 09/20/20    Derek Jack, MD  prochlorperazine (COMPAZINE) 10 MG tablet Take 1 tablet (10 mg total) by mouth every 6 (six) hours as needed (Nausea or vomiting). 11/18/17   Holley Bouche, NP  senna (SENOKOT) 8.6 MG tablet Take 3 tablets by mouth daily.     [provider]  vitamin B-12 (CYANOCOBALAMIN) 100 MCG tablet Take 100 mcg by mouth daily.    [provider]    Allergies    Codeine and Niaspan [niacin er]  Review of Systems   Review of Systems  Unable to perform ROS: Mental status change  HENT: Negative for congestion.   Respiratory: Negative for cough, chest tightness, shortness of breath and wheezing.   Cardiovascular: Negative for  chest pain.  Gastrointestinal: Positive for abdominal pain. Negative for constipation, diarrhea, nausea and vomiting.  Musculoskeletal: Positive for back pain. Negative for neck pain and neck stiffness.  Neurological: Positive for speech difficulty, weakness and headaches. Negative for facial asymmetry, light-headedness and numbness.  Psychiatric/Behavioral: Positive for confusion. Negative for agitation.    Physical Exam Updated Vital Signs BP (!) 156/74 (BP Location: Right Arm)   Pulse 82   Resp 20   Wt 66.7 kg   SpO2 93%   BMI 21.71 kg/m   Physical Exam Vitals and nursing note reviewed.  Constitutional:      Appearance: He is well-developed. He is ill-appearing.  HENT:     Nose: No rhinorrhea.     Mouth/Throat:     Mouth: Mucous membranes are dry.     Pharynx: No oropharyngeal exudate or posterior oropharyngeal erythema.  Eyes:     Conjunctiva/sclera: Conjunctivae normal.     Pupils: Pupils are equal, round, and reactive to light.  Cardiovascular:     Rate and Rhythm: Normal rate and regular rhythm.     Heart sounds: No murmur heard.   Pulmonary:     Effort: Pulmonary effort is normal. No respiratory distress.     Breath sounds: Rhonchi present. No wheezing or rales.  Chest:     Chest wall: Tenderness present.   Abdominal:     General: Abdomen is flat.     Palpations: Abdomen is soft.     Tenderness: There is abdominal tenderness. There is no right CVA tenderness or left CVA tenderness.  Musculoskeletal:        General: Tenderness present.     Cervical back: Neck supple. No tenderness.     Left hip: Tenderness present.       Legs:     Comments: Patient is able to move both feet but has decreased strength in left leg raise.  Patient does have a left hip fracture.  Some decrease in grip strength on the left.  Right  Skin:    General: Skin is warm and dry.     Capillary Refill: Capillary refill takes less than 2 seconds.     Findings: No erythema.  Neurological:     Mental Status: He is alert. He is disoriented.     Cranial Nerves: No dysarthria.     Sensory: No sensory deficit.     Motor: Weakness present.     Comments: Patient has a left-sided neglect and right gaze preference.  He has diminished blink to threat on the left.  Patient has mild weakness in left arm and left leg compared to right.  Patient has bruising on his left forehead with swelling.  Difficult to assess my range of motion but pupils are symmetric and reactive.  Tenderness in the back and left hip.  Mild tenderness in the abdomen and chest.     ED Results / Procedures / Treatments   Labs (all labs ordered are listed, but only abnormal results are displayed) Labs Reviewed  LACTIC ACID, PLASMA - Abnormal; Notable for the following components:      Result Value   Lactic Acid, Venous 2.6 (*)    All other components within normal limits  LACTIC ACID, PLASMA - Abnormal; Notable for the following components:   Lactic Acid, Venous 2.8 (*)    All other components within normal limits  COMPREHENSIVE METABOLIC PANEL - Abnormal; Notable for the following components:   Potassium 3.4 (*)    Glucose, Bld 120 (*)  BUN 50 (*)    AST 209 (*)    ALT 117 (*)    Total Bilirubin 1.3 (*)    All other components within normal limits   CK - Abnormal; Notable for the following components:   Total CK 4,232 (*)    All other components within normal limits  CBC - Abnormal; Notable for the following components:   WBC 11.8 (*)    RBC 3.97 (*)    Hemoglobin 12.6 (*)    HCT 38.3 (*)    RDW 19.2 (*)    All other components within normal limits  URINALYSIS, ROUTINE W REFLEX MICROSCOPIC - Abnormal; Notable for the following components:   Color, Urine AMBER (*)    APPearance HAZY (*)    Hgb urine dipstick LARGE (*)    Ketones, ur 20 (*)    Protein, ur 100 (*)    Bacteria, UA RARE (*)    All other components within normal limits  CBG MONITORING, ED - Abnormal; Notable for the following components:   Glucose-Capillary 117 (*)    All other components within normal limits  RESP PANEL BY RT-PCR (FLU A&B, COVID) ARPGX2  LIPASE, BLOOD  MAGNESIUM  PROTIME-INR  HIV ANTIBODY (ROUTINE TESTING W REFLEX)  COMPREHENSIVE METABOLIC PANEL  CBC    EKG EKG Interpretation  Date/Time:  Wednesday October 10 2020 15:45:27 EDT Ventricular Rate:  68 PR Interval:  147 QRS Duration: 97 QT Interval:  437 QTC Calculation: 465 R Axis:   83 Text Interpretation: Sinus rhythm Atrial premature complexes Borderline right axis deviation Probable left ventricular hypertrophy Repol abnrm suggests ischemia, diffuse leads when compared to prior,more abnmal ST segments and possibleU waves. No STEMI Confirmed by Antony Blackbird [phone]) on 10/10/2020 3:48:37 PM   Radiology CT Angio Head W or Wo Contrast  Result Date: 10/10/2020 CLINICAL DATA:  Fall. History of extensive stage small cell lung cancer. EXAM: CT ANGIOGRAPHY HEAD AND NECK TECHNIQUE: Multidetector CT imaging of the head and neck was performed using the standard protocol during bolus administration of intravenous contrast. Multiplanar CT image reconstructions and MIPs were obtained to evaluate the vascular anatomy. Carotid stenosis measurements (when applicable) are obtained utilizing NASCET  criteria, using the distal internal carotid diameter as the denominator. CONTRAST:  21mL OMNIPAQUE IOHEXOL 350 MG/ML SOLN COMPARISON:  Head, cervical spine, and maxillofacial CTs earlier today. Neck CT 09/03/2020. No prior angiographic imaging. FINDINGS: CTA NECK FINDINGS Aortic arch: Standard 3 vessel aortic arch with mild atherosclerotic plaque. No significant arch vessel origin stenosis. Right carotid system: Patent with soft and calcified plaque in the carotid bulb. 65% stenosis of the ICA at the distal aspect of the bulb. Left carotid system: Patent with predominantly calcified plaque in the carotid bulb. No evidence of significant stenosis or dissection. Vertebral arteries: Patent without evidence of dissection or a significant stenosis on the left. Calcified plaque at the right vertebral artery origin results in moderate stenosis. Skeleton: Mild compression fractures in the upper thoracic spine, also present on a chest CT last month. Severe facet arthrosis on the left at C2-3 and on the right at C3-4 with trace anterolisthesis at the latter. Fracture involving the anterior wall of the left frontal sinus as described on earlier maxillofacial CT. Other neck: 1.3 cm short axis superficial lymph node along the right lower neck and 1.2 cm short axis left level V lymph node as noted on today's earlier cervical spine CT, both larger than on the 09/03/2020 neck CT. 1.9 x 1.3 cm low-density  focus in the left nasopharyngeal region, unchanged from the prior neck CT and potentially reflecting a necrotic retropharyngeal lymph node. Unchanged 0.8 cm right parotid nodule. Upper chest: Severe emphysema. Review of the MIP images confirms the above findings CTA HEAD FINDINGS Anterior circulation: The internal carotid arteries are patent from skull base to carotid termini with minimal nonstenotic plaque. ACAs and MCAs are patent without evidence of a proximal branch occlusion or significant proximal stenosis. The right A1  segment is mildly hypoplastic. No aneurysm is identified. Posterior circulation: The intracranial vertebral arteries are patent to the basilar with mild irregularity but no flow limiting stenosis. 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 There is a small right posterior communicating artery. Both PCAs are patent without evidence of a significant proximal stenosis. No aneurysm is identified. Venous sinuses: Patent. Anatomic variants: None of significance. Review of the MIP images confirms the above findings IMPRESSION: 1. No large vessel occlusion or significant proximal intracranial stenosis. 2. 65% proximal right ICA stenosis. 3. Moderate right vertebral artery origin stenosis. 4. Bilateral lower neck lymphadenopathy, mildly progressed from the 09/03/2020 neck CT. 5. Aortic Atherosclerosis (ICD10-I70.0) and Emphysema (ICD10-J43.9). Electronically Signed   By: Logan Bores M.D.   On: 10/10/2020 19:54   CT Head Wo Contrast  Result Date: 10/10/2020 CLINICAL DATA:  Fall on bathroom, laceration along the head EXAM: CT HEAD WITHOUT CONTRAST TECHNIQUE: Contiguous axial images were obtained from the base of the skull through the vertex without intravenous contrast. COMPARISON:  09/07/2019 FINDINGS: Brain: The brainstem, cerebellum, cerebral peduncles, thalami, basal ganglia, basilar cisterns, and ventricular system appear within normal limits. Periventricular white matter and corona radiata hypodensities favor chronic ischemic microvascular white matter disease. No intracranial hemorrhage, mass lesion, or acute CVA. Vascular: There is atherosclerotic calcification of the cavernous carotid arteries bilaterally. Skull: Left frontal fracture along the anterior margin of the frontal sinus underlying the scalp hematoma as shown on image 59 series 4. Sinuses/Orbits: There is opacification of several left-sided mastoid air cells. Although there is a fracture of the anterior margin  of the left frontal sinus, no fluid is identified in the sinus at this time. Left periorbital hematoma without observed intraorbital abnormality. Other: Left forehead scalp hematoma overlying the frontal fracture. IMPRESSION: 1. Fracture of the anterior wall of the left frontal sinus, with overlying scalp hematoma. 2. No intracranial hemorrhage or specific acute intracranial findings. Periventricular white matter and corona radiata hypodensities favor chronic ischemic microvascular white matter disease. 3. Opacification of several left-sided mastoid air cells. Electronically Signed   By: Van Clines M.D.   On: 10/10/2020 17:06   CT Angio Neck W and/or Wo Contrast  Result Date: 10/10/2020 CLINICAL DATA:  Fall. History of extensive stage small cell lung cancer. EXAM: CT ANGIOGRAPHY HEAD AND NECK TECHNIQUE: Multidetector CT imaging of the head and neck was performed using the standard protocol during bolus administration of intravenous contrast. Multiplanar CT image reconstructions and MIPs were obtained to evaluate the vascular anatomy. Carotid stenosis measurements (when applicable) are obtained utilizing NASCET criteria, using the distal internal carotid diameter as the denominator. CONTRAST:  43mL OMNIPAQUE IOHEXOL 350 MG/ML SOLN COMPARISON:  Head, cervical spine, and maxillofacial CTs earlier today. Neck CT 09/03/2020. No prior angiographic imaging. FINDINGS: CTA NECK FINDINGS Aortic arch: Standard 3 vessel aortic arch with mild atherosclerotic plaque. No significant arch vessel origin stenosis. Right carotid system: Patent with soft and calcified plaque in the carotid bulb. 65% stenosis of the ICA at the distal  aspect of the bulb. Left carotid system: Patent with predominantly calcified plaque in the carotid bulb. No evidence of significant stenosis or dissection. Vertebral arteries: Patent without evidence of dissection or a significant stenosis on the left. Calcified plaque at the right vertebral  artery origin results in moderate stenosis. Skeleton: Mild compression fractures in the upper thoracic spine, also present on a chest CT last month. Severe facet arthrosis on the left at C2-3 and on the right at C3-4 with trace anterolisthesis at the latter. Fracture involving the anterior wall of the left frontal sinus as described on earlier maxillofacial CT. Other neck: 1.3 cm short axis superficial lymph node along the right lower neck and 1.2 cm short axis left level V lymph node as noted on today's earlier cervical spine CT, both larger than on the 09/03/2020 neck CT. 1.9 x 1.3 cm low-density focus in the left nasopharyngeal region, unchanged from the prior neck CT and potentially reflecting a necrotic retropharyngeal lymph node. Unchanged 0.8 cm right parotid nodule. Upper chest: Severe emphysema. Review of the MIP images confirms the above findings CTA HEAD FINDINGS Anterior circulation: The internal carotid arteries are patent from skull base to carotid termini with minimal nonstenotic plaque. ACAs and MCAs are patent without evidence of a proximal branch occlusion or significant proximal stenosis. The right A1 segment is mildly hypoplastic. No aneurysm is identified. Posterior circulation: The intracranial vertebral arteries are patent to the basilar with mild irregularity but no flow limiting stenosis. 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 There is a small right posterior communicating artery. Both PCAs are patent without evidence of a significant proximal stenosis. No aneurysm is identified. Venous sinuses: Patent. Anatomic variants: None of significance. Review of the MIP images confirms the above findings IMPRESSION: 1. No large vessel occlusion or significant proximal intracranial stenosis. 2. 65% proximal right ICA stenosis. 3. Moderate right vertebral artery origin stenosis. 4. Bilateral lower neck lymphadenopathy, mildly progressed from the  09/03/2020 neck CT. 5. Aortic Atherosclerosis (ICD10-I70.0) and Emphysema (ICD10-J43.9). Electronically Signed   By: Logan Bores M.D.   On: 10/10/2020 19:54   CT Chest W Contrast  Result Date: 10/10/2020 CLINICAL DATA:  Fall in the bathroom. Left hip pain. Stage IV small cell lung cancer. EXAM: CT CHEST, ABDOMEN, AND PELVIS WITH CONTRAST TECHNIQUE: Multidetector CT imaging of the chest, abdomen and pelvis was performed following the standard protocol during bolus administration of intravenous contrast. CONTRAST:  80 cc Omnipaque 300 IV COMPARISON:  CT chest abdomen pelvis 09/03/2020. FINDINGS: CT CHEST FINDINGS Cardiovascular: No evidence of aortic or acute vascular injury. Moderate aortic atherosclerosis. Right chest port with tip in the lower SVC. Normal heart size with coronary artery calcifications. No pericardial fluid. Mediastinum/Nodes: No mediastinal hemorrhage or hematoma. No enlarged mediastinal or hilar lymph nodes. No pneumomediastinum. Decompressed esophagus. No thyroid nodule. Lungs/Pleura: No pneumothorax or pulmonary contusion. Moderately advanced emphysema. New from prior exam is frothy debris layering in the trachea and tracking into the left greater than right mainstem bronchi. Dependent opacity at the left lung base favored to be atelectasis. Multiple pulmonary nodules are again seen. Accurate measurement of these nodules is limited on the current exam due to motion. Dominant nodules are in the right lower lobe on series 5, image 117 and 123, and in the left lower lobe image 119. These are grossly stable, but again not well measured. No pleural fluid. Musculoskeletal: Breathing motion limits detailed rib assessment. No displaced rib fracture. No evidence of acute  fracture of the sternum, included clavicles, or shoulder girdles. No fracture of the thoracic spine. No evidence of focal bone lesion. No confluent chest wall abnormality. CT ABDOMEN PELVIS FINDINGS Hepatobiliary: No hepatic injury  or perihepatic hematoma. Tiny hypodense lesion within segment 4 of the liver is too small to characterize but unchanged. Unremarkable gallbladder. Pancreas: No evidence of injury. No ductal dilatation or inflammation. Spleen: No splenic injury or perisplenic hematoma. Splenule anteriorly. Adrenals/Urinary Tract: No adrenal nodule. No adrenal hemorrhage. No evidence of renal injury. No hydronephrosis. There are bilateral renal cysts as well as low-density lesions too small to accurately characterize, grossly stable from CT last month. Punctate calcification in the anterior dome of the bladder is unchanged. No bladder wall thickening or injury Stomach/Bowel: No evidence of bowel injury or acute abnormality. Lack of enteric contrast as well as patient motion limits detailed assessment. Decompressed stomach. No bowel inflammation. There is no free air. Normal appendix. Sigmoid diverticulosis without diverticulitis. Vascular/Lymphatic: No vascular injury. No acute vascular findings. Moderate aortic atherosclerosis without acute aortic abnormality. IVC is intact. No retroperitoneal fluid. No abdominopelvic adenopathy. Reproductive: Prostate is unremarkable. Other: No ascites or free fluid. No free air. Confluent body wall contusion. Musculoskeletal: Displaced, angulated mildly comminuted left femoral neck fracture. No obvious underlying bone lesion on CT. The remainder of the bony pelvis is intact. Nondisplaced left L3 transverse process fracture. IMPRESSION: 1. Displaced, angulated, mildly comminuted left femoral neck fracture. 2. Nondisplaced left L3 transverse process fracture. 3. No additional acute traumatic injury to the chest, abdomen, or pelvis. 4. New frothy debris layering in the trachea and tracking into the left greater than right mainstem bronchi, retained secretions or aspiration. 5. Multiple pulmonary nodules are grossly stable from CT last month. Please note the detailed nodule evaluation is limited on the  current exam due to motion artifact. 6. Chronic findings of colonic diverticulosis without diverticulitis. Moderate emphysema. Aortic Atherosclerosis (ICD10-I70.0) and Emphysema (ICD10-J43.9). Electronically Signed   By: Keith Rake M.D.   On: 10/10/2020 17:14   CT Cervical Spine Wo Contrast  Result Date: 10/10/2020 CLINICAL DATA:  Fall, head injury. EXAM: CT CERVICAL SPINE WITHOUT CONTRAST TECHNIQUE: Multidetector CT imaging of the cervical spine was performed without intravenous contrast. Multiplanar CT image reconstructions were also generated. COMPARISON:  CT neck dated 09/03/2020 FINDINGS: Alignment: No vertebral subluxation is observed. Skull base and vertebrae: No cervical spine fracture or acute bony findings. Soft tissues and spinal canal: Atherosclerotic calcification of the carotid arteries and branch vessels. Superficial lymph node along the right lower neck measures 1.3 cm in short axis on image 69 of series 5, previously 1.0 cm. Hypodensity along the left posterior nasopharyngeal margin measuring 2.1 by 1.8 cm, similar to the prior exam. Left level V lymph node measures 1.2 cm in short axis on image 64 series 5, previously 0.9 cm. Disc levels: Mild left foraminal stenosis at C2-3, and right foraminal stenosis at C3-4 due to facet spurring. Upper chest: Severe emphysema in the lung apices. Biapical pleuroparenchymal scarring. Mucus in the trachea. Other: Right central line partially observed. IMPRESSION: 1. No acute cervical spine findings. 2. Enlargement of 2 lymph nodes in the lower neck compared to the 09/03/2020 CT of the neck. This may be a manifestation of the patient's known malignancy. 3. Foraminal narrowing at C2-3 and C3-4 due to spurring. 4. Severe emphysema. 5. Carotid atherosclerosis. 6. Stable hypodensity along the left posterior nasopharynx, potentially a mucous retention cyst or necrotic lymph node. Electronically Signed   By: Thayer Jew  Janeece Fitting M.D.   On: 10/10/2020 17:13    CT ABDOMEN PELVIS W CONTRAST  Result Date: 10/10/2020 CLINICAL DATA:  Fall in the bathroom. Left hip pain. Stage IV small cell lung cancer. EXAM: CT CHEST, ABDOMEN, AND PELVIS WITH CONTRAST TECHNIQUE: Multidetector CT imaging of the chest, abdomen and pelvis was performed following the standard protocol during bolus administration of intravenous contrast. CONTRAST:  80 cc Omnipaque 300 IV COMPARISON:  CT chest abdomen pelvis 09/03/2020. FINDINGS: CT CHEST FINDINGS Cardiovascular: No evidence of aortic or acute vascular injury. Moderate aortic atherosclerosis. Right chest port with tip in the lower SVC. Normal heart size with coronary artery calcifications. No pericardial fluid. Mediastinum/Nodes: No mediastinal hemorrhage or hematoma. No enlarged mediastinal or hilar lymph nodes. No pneumomediastinum. Decompressed esophagus. No thyroid nodule. Lungs/Pleura: No pneumothorax or pulmonary contusion. Moderately advanced emphysema. New from prior exam is frothy debris layering in the trachea and tracking into the left greater than right mainstem bronchi. Dependent opacity at the left lung base favored to be atelectasis. Multiple pulmonary nodules are again seen. Accurate measurement of these nodules is limited on the current exam due to motion. Dominant nodules are in the right lower lobe on series 5, image 117 and 123, and in the left lower lobe image 119. These are grossly stable, but again not well measured. No pleural fluid. Musculoskeletal: Breathing motion limits detailed rib assessment. No displaced rib fracture. No evidence of acute fracture of the sternum, included clavicles, or shoulder girdles. No fracture of the thoracic spine. No evidence of focal bone lesion. No confluent chest wall abnormality. CT ABDOMEN PELVIS FINDINGS Hepatobiliary: No hepatic injury or perihepatic hematoma. Tiny hypodense lesion within segment 4 of the liver is too small to characterize but unchanged. Unremarkable gallbladder.  Pancreas: No evidence of injury. No ductal dilatation or inflammation. Spleen: No splenic injury or perisplenic hematoma. Splenule anteriorly. Adrenals/Urinary Tract: No adrenal nodule. No adrenal hemorrhage. No evidence of renal injury. No hydronephrosis. There are bilateral renal cysts as well as low-density lesions too small to accurately characterize, grossly stable from CT last month. Punctate calcification in the anterior dome of the bladder is unchanged. No bladder wall thickening or injury Stomach/Bowel: No evidence of bowel injury or acute abnormality. Lack of enteric contrast as well as patient motion limits detailed assessment. Decompressed stomach. No bowel inflammation. There is no free air. Normal appendix. Sigmoid diverticulosis without diverticulitis. Vascular/Lymphatic: No vascular injury. No acute vascular findings. Moderate aortic atherosclerosis without acute aortic abnormality. IVC is intact. No retroperitoneal fluid. No abdominopelvic adenopathy. Reproductive: Prostate is unremarkable. Other: No ascites or free fluid. No free air. Confluent body wall contusion. Musculoskeletal: Displaced, angulated mildly comminuted left femoral neck fracture. No obvious underlying bone lesion on CT. The remainder of the bony pelvis is intact. Nondisplaced left L3 transverse process fracture. IMPRESSION: 1. Displaced, angulated, mildly comminuted left femoral neck fracture. 2. Nondisplaced left L3 transverse process fracture. 3. No additional acute traumatic injury to the chest, abdomen, or pelvis. 4. New frothy debris layering in the trachea and tracking into the left greater than right mainstem bronchi, retained secretions or aspiration. 5. Multiple pulmonary nodules are grossly stable from CT last month. Please note the detailed nodule evaluation is limited on the current exam due to motion artifact. 6. Chronic findings of colonic diverticulosis without diverticulitis. Moderate emphysema. Aortic  Atherosclerosis (ICD10-I70.0) and Emphysema (ICD10-J43.9). Electronically Signed   By: Keith Rake M.D.   On: 10/10/2020 17:14   CT Maxillofacial Wo Contrast  Result Date: 10/10/2020  CLINICAL DATA:  Facial trauma EXAM: CT MAXILLOFACIAL WITHOUT CONTRAST TECHNIQUE: Multidetector CT imaging of the maxillofacial structures was performed. Multiplanar CT image reconstructions were also generated. COMPARISON:  CT head 10/10/2020 FINDINGS: Osseous: Acute fracture of the anterior wall of the left frontal sinus with overlying scalp hematoma as on image 75 series 5. No significant fluid is currently present in the sinus. No other facial fractures are identified. Orbits: Left periorbital hematoma but without intraorbital abnormality observed. Sinuses: As noted above there is a fracture along the anterior wall of the left frontal sinus but without a fluid level in the sinus. Opacification of several mastoid air cells noted compatible with a small left mastoid effusion. Soft tissues: Left forehead scalp hematoma extending into the periorbital region. Limited intracranial: Please see CT head report. IMPRESSION: 1. Acute fracture the anterior wall of the left frontal sinus with overlying scalp hematoma. 2. Small left mastoid effusion. Electronically Signed   By: Van Clines M.D.   On: 10/10/2020 17:15    Procedures Procedures   Medications Ordered in ED Medications  ceFEPIme (MAXIPIME) 2 g in sodium chloride 0.9 % 100 mL IVPB (0 g Intravenous Stopped 10/10/20 2146)  vancomycin (VANCOREADY) IVPB 1250 mg/250 mL (has no administration in time range)  0.45 % NaCl with KCl 20 mEq / L infusion ( Intravenous New Bag/Given 10/10/20 2332)  acetaminophen (TYLENOL) tablet 650 mg (has no administration in time range)    Or  acetaminophen (TYLENOL) suppository 650 mg (has no administration in time range)  HYDROmorphone (DILAUDID) injection 0.5-1 mg (has no administration in time range)  polyethylene glycol (MIRALAX  / GLYCOLAX) packet 17 g (has no administration in time range)  sodium chloride 0.9 % bolus 1,000 mL (0 mLs Intravenous Stopped 10/10/20 1729)  iohexol (OMNIPAQUE) 300 MG/ML solution 80 mL (80 mLs Intravenous Contrast Given 10/10/20 1641)  sodium chloride 0.9 % bolus 1,000 mL (0 mLs Intravenous Stopped 10/10/20 2146)  iohexol (OMNIPAQUE) 350 MG/ML injection 75 mL (75 mLs Intravenous Contrast Given 10/10/20 1927)  LORazepam (ATIVAN) injection 0.5 mg (0.5 mg Intravenous Given 10/10/20 1956)  vancomycin (VANCOREADY) IVPB 1250 mg/250 mL (0 mg Intravenous Stopped 10/10/20 2327)    ED Course  I have reviewed the triage vital signs and the nursing notes.  Pertinent labs & imaging results that were available during my care of the patient were reviewed by me and considered in my medical decision making (see chart for details).    MDM Rules/Calculators/A&P                          TIMMIE DUGUE is a 70 y.o. male with a past medical history significant for CAD with prior PCI, lung cancer, anxiety, depression, and COPD who presents for altered mental status and fall.  Patient was last normal on Sunday, 4 days ago.  He is brought in by EMS but brother arrived and is able to write further information.  Brother reports that the last time he talked the patient was on Sunday and patient was acting completely normally.  He reports that on Monday, he talked to patient on the phone and he was not "sounding right".  The family thought that this was just patient being tired and he was supposed to go to a chemotherapy treatment later in the day.  Yesterday, patient did not answer his phone but the family thought the patient was resting again.  Today, family with a check on him and he found  him down on the ground with poor responsiveness.  According to EMS report to nursing, patient was found approximate 40 feet from where it appeared that things had fallen in the bathroom.  Patient has significant bruising to the left side of  his head and is having a right gaze preference and appears to have some left-sided neglect.  Patient also has left hip tenderness and diffuse abdominal and chest tenderness.  Patient has some bruising.  Family edema suspect the patient may have been laying on the ground for at least 24 hours if not longer.  On exam, patient is in a c-collar.  Patient is primarily responding yes to questions but occasionally will say an appropriate answer.  He does not have limb to threat on the left but does on the right.  Pupils are symmetric and reactive but he has difficulty with extraocular movements.  He is disoriented.  His chest is diffusely tender as is his abdomen.  Bowel sounds were appreciated.  He has tenderness in the left hip.  Lungs were coarse bilaterally.  Patient has significant bruising to the left orbit and left face.  Difficult to assess for entrapment as he will not move his eyes around.  No evidence of nasal septal hematoma on initial exam.  Patient was able to move extremities but seems weaker in the left arm and left leg compared to the right side.  Difficult to assess sensation but he did move the extremities when I touch them.  Clinically I suspect patient had a fall and has an intracerebral hemorrhage.  We will get stat head CT, face and neck CT.  Will also get CT of the chest/abdomen/pelvis, and portable images of the chest and pelvis and hip.  I suspect he will have some electrolyte derangements from being down on the ground for several days.  His initial EKG showed U waves which may signify hypokalemia.  We will get mag level, CK, the labs.  Will check urinalysis.  We will check for Covid as anticipate admission.  Anticipate admission after work-up  6:05 PM Work-up is begun to return.  Several injuries of been found on his imaging.  Patient has a left frontal sinus fracture of the skull.  Hematoma overlying seen but no other injuries in the head and neck.  They did not see acute hemorrhage  or acute stroke but there is some evidence of chronic microvascular white matter disease.  C-spine shows no acute fracture.  Will remove collar.  Imaging of the chest/7/pelvis reveal several injuries including a left femoral neck fracture, left transverse process fracture, and some frothy debris likely related to aspiration.  We will call orthopedics for the hip fracture, will call neurosurgery for the L3 fracture and frontal sinus fracture, and will speak to neurology about the neurologic deficits he currently is experiencing with the left-sided weakness, left-sided neglect, and concern for left visual field abnormalities.  Neurology recommends CTA head and neck as well as MRI of the brain.  They request being called if there are abnormalities seen on the imaging.  We will call for medicine admission and the consultants respectively.  We will give fluids as his labs have returned showing evidence of rhabdo.  AST and ALT also elevated as well as bilirubin.  No evidence of UTI.  Lactic acid elevated at 2.6.  Patient intermittently is hypoxic and will be placed on oxygen.  6:37 PM Neurosurgery feels there is nothing needed to be done about the frontal sinus fracture or  the transverse process fracture.  We would admit to medicine and wait for orthopedics to call.  Final Clinical Impression(s) / ED Diagnoses Final diagnoses:  Non-traumatic rhabdomyolysis  Fall, initial encounter  Closed fracture of left hip, initial encounter ([place])  Closed fracture of transverse process of lumbar vertebra, initial encounter ([place])  Closed fracture of frontal sinus, initial encounter ([place])  Left-sided weakness  Altered mental status, unspecified altered mental status type    Clinical Impression: 1. Non-traumatic rhabdomyolysis   2. Fall, initial encounter   3. Closed fracture of left hip, initial encounter ([place])   4. Closed fracture of transverse process of lumbar vertebra, initial encounter ([place])   5.  Closed fracture of frontal sinus, initial encounter ([place])   6. Left-sided weakness   7. Altered mental status, unspecified altered mental status type     Disposition: Admit  This note was prepared with assistance of Dragon voice recognition software. Occasional wrong-word or sound-a-like substitutions may have occurred due to the inherent limitations of voice recognition software.       Dyquan Minks, Gwenyth Allegra, MD 10/11/20 [phone]

## 2020-10-10 NOTE — Progress Notes (Signed)
Pharmacy Antibiotic Note  [name] is a 70 y.o. male admitted on 10/10/2020 with sepsis.  Pharmacy has been consulted for vancomycin and cefepime dosing. Scr 1.14 (BL ~0.8-1.0).  Plan: Vancomycin 1250 mg IV every 24 hours (eAUC 498, goal AUC 400-550) Cefepime 2 g IV every 12 hours Monitor renal function, C&S, clinical status, vanc levels as indicated   Height: 5\' 9"  (175.3 cm) Weight: 66.7 kg (147 lb) IBW/kg (Calculated) : 70.7  Temp (24hrs), Avg:97.4 F (36.3 C), Min:97.4 F (36.3 C), Max:97.4 F (36.3 C)  Recent Labs  Lab 10/10/20 1600 10/10/20 1602 10/10/20 1825  WBC  --  11.8*  --   CREATININE  --  1.14  --   LATICACIDVEN 2.6*  --  2.8*    Estimated Creatinine Clearance: 57.7 mL/min (by C-G formula based on SCr of 1.14 mg/dL).    Allergies  Allergen Reactions  . Codeine Nausea Only  . Niaspan [Niacin Er]     Antimicrobials this admission: Vancomycin 3/30> Cefepime 3/30>  Dose adjustments this admission: N/A  Microbiology results: 3/30 Bcx: 3/30 Ucx:  Thank you for allowing pharmacy to be a part of this patient's care.  Romilda Garret, PharmD PGY1 Acute Care Pharmacy Resident 10/10/2020 9:02 PM  Please check [email or web address] for unit specific pharmacy phone numbers.

## 2020-10-10 NOTE — H&P (Addendum)
[facility] History & Physical  Kelly Splinter MD  Referring Provider: Dr Sherry Ruffing  [name] 10/10/2020  Chief Complaint: AMS, fall w/ facial fractures HPI: The patient is a 70 y.o. year-old w/ hx of tobacco use, CAD/ MI/ sp stent placement, COPD , cancer getting chemoRx who was found down at home by his brother after he couldn't get in touch w/ him x 24 hrs.  Found to be confused and had laceration / bruising around the L eye and L forehead and was complaining of L hip pain.  In the ED BP was 170/67, HR 70-90, RR 20, temp 97.4, SpO2 89-95% on RA / 2L [place].  Labs showed K 3.4 , BN 50, Creat 1.14, Ca 10.0, alb 4.2.  AST 209  ALT 117  CPK 4232  Tbili 1.3.  Lactic acid 2.6, repeat 2.8.  WBC 11.8  Hb 12.6  Flu/ COVID negative. UA 6-10 rbc, 0-5 wbc, rare bact, neg LE/ nitrite.  Multiple CT scans were done , see results below.  Essentially pt has fractured the ant wall of L frontal sinus w/ scalp hematoma; fracture of the left L3 transverse process and displaced angulated mildly comminuted L femoral neck fracture.  CT head showed no acute findings such as CVA or bleed, there are some +chronic WM changes. +COPD. Neurology (AMS) , neurosurg (L3 fracture) and orthopedics (hip fx) were consulted by ED MD. MRI brain was attempted but pt was not cooperative.  Pt rec'd 2 L IVF's in the ED.  AST/ ALT and CPK were moderately elevated. UA was unremarkable and CT chest showed diffuse emphysema w/ some mild LLL atelectasis. We are asked to see for admission.   Pt seen in ED.  He is responsive to yes / no questions and follows simple commands ("try and hold your arm up").  L eye is closed. No chest pain or abd pain. L leg and hip are hurting.    ROS  denies CP  no joint pain   no HA no diarrhea  no nausea/ vomiting   Past Medical History  Past Medical History:  Diagnosis Date  . Anxiety   . CAD (coronary artery disease)   . Cancer ([place])    stage 4 small cell lung cancer   . COPD (chronic  obstructive pulmonary disease) ([place])   . Depression   . Dyspnea    increased exertion  . Feeling of chest tightness   . Heart palpitations   . History of chemotherapy   . Myocardial infarction ([place])   . Osteopenia   . Panic attacks   . Smoker    Past Surgical History  Past Surgical History:  Procedure Laterality Date  . BACK SURGERY  12/24/2000   L5,S1  . CORONARY STENT PLACEMENT  2005   RCA & CX  . HERNIA REPAIR Right 1980's  . INGUINAL HERNIA REPAIR  12/1978   right side  . IR FLUORO GUIDE PORT INSERTION RIGHT  04/02/2017  . IR US GUIDE BX ASP/DRAIN  02/03/2017  . IR US GUIDE VASC ACCESS RIGHT  04/02/2017  . NM MYOCAR PERF WALL MOTION  09/07/2009   No ischemia; EF 51%  . SHOULDER SURGERY Left 08/2010  . SPINE SURGERY  2002   L5-S1   Family History  Family History  Problem Relation Age of Onset  . Heart attack Father   . Kidney disease Father        renal failure  . Heart failure Mother   . Heart  attack Mother   . Cancer Brother   . Diabetes Brother   . Alcohol abuse Brother   . Diabetes Sister    Social History  reports that he has been smoking cigarettes. He has been smoking about 1.00 pack per day. He has never used smokeless tobacco. He reports current alcohol use. He reports that he does not use drugs. Allergies  Allergies  Allergen Reactions  . Codeine Nausea Only  . Niaspan [Niacin Er]    Home medications Prior to Admission medications   Medication Sig Start Date End Date Taking? Authorizing Provider  albuterol (VENTOLIN HFA) 108 (90 Base) MCG/ACT inhaler Inhale 2 puffs into the lungs every 6 (six) hours as needed for wheezing or shortness of breath.    [provider]  ALPRAZolam (XANAX) 1 MG tablet TAKE (1) TABLET BY MOUTH (4) TIMES DAILY. 09/17/20   [name], MD  Ascorbic Acid (VITAMIN C) 1000 MG tablet Take 1,000 mg by mouth daily.    [provider]  aspirin EC 81 MG tablet Take 81 mg by mouth daily.    [provider]   atorvastatin (LIPITOR) 40 MG tablet TAKE (1) TABLET BY MOUTH ONCE DAILY. 06/22/20   [name], MD  calcium-vitamin D (OSCAL WITH D) 250-125 MG-UNIT tablet Take 1 tablet by mouth daily.    [provider]  cholecalciferol (VITAMIN D3) 25 MCG (1000 UT) tablet Take 1,000 Units by mouth daily.    [provider]  docusate sodium (COLACE) 100 MG capsule Take two capsules at bedtime starting the day you receive chemotherapy and then for four days after 12/29/19   Derek Jack, MD  gabapentin (NEURONTIN) 300 MG capsule TAKE (2) CAPSULES BY MOUTH THREE TIMES DAILY. Patient taking differently: Take 600 mg by mouth 3 (three) times daily. 04/23/20   Derek Jack, MD  hydrOXYzine (ATARAX/VISTARIL) 50 MG tablet TAKE ONE TABLET BY MOUTH AT BEDTIME AS NEEDED FOR SLEEP. 07/02/20   [name], MD  mirtazapine (REMERON) 15 MG tablet Take 2 tablets (30 mg total) by mouth at bedtime. 08/13/20   Derek Jack, MD  mirtazapine (REMERON) 15 MG tablet TAKE (1) TABLET BY MOUTH AT BEDTIME. Patient taking differently: Take 30 mg by mouth at bedtime. 08/13/20   Derek Jack, MD  mirtazapine (REMERON) 15 MG tablet TAKE (1) TABLET BY MOUTH AT BEDTIME. 08/13/20   Derek Jack, MD  Multiple Vitamins-Minerals (CENTRUM SILVER 50+MEN) TABS Take 1 tablet by mouth daily.    [provider]  ondansetron (ZOFRAN) 8 MG tablet Take 1 tablet (8 mg total) by mouth 2 (two) times daily as needed. Start on the third day after cisplatin chemotherapy. 10/05/17   Holley Bouche, NP  oxyCODONE-acetaminophen (PERCOCET/ROXICET) 5-325 MG tablet TAKE 1 TABLET EVERY 8 HOURS AS NEEDED FOR SEVERE PAIN. 07/05/20   Benay Pike, MD  oxyCODONE-acetaminophen (PERCOCET/ROXICET) 5-325 MG tablet TAKE 1 TABLET EVERY 8 HOURS AS NEEDED FOR SEVERE PAIN. 07/04/20   Harle Stanford., PA-C  oxyCODONE-acetaminophen (PERCOCET/ROXICET) 5-325 MG tablet TAKE 1 TABLET EVERY 8 HOURS AS NEEDED FOR  SEVERE PAIN. 08/13/20   Derek Jack, MD  oxyCODONE-acetaminophen (PERCOCET/ROXICET) 5-325 MG tablet TAKE 1 TABLET EVERY 8 HOURS AS NEEDED FOR SEVERE PAIN. 09/20/20   Derek Jack, MD  prochlorperazine (COMPAZINE) 10 MG tablet Take 1 tablet (10 mg total) by mouth every 6 (six) hours as needed (Nausea or vomiting). 11/18/17   Holley Bouche, NP  senna (SENOKOT) 8.6 MG tablet Take 3 tablets by mouth  daily.     [provider]  vitamin B-12 (CYANOCOBALAMIN) 100 MCG tablet Take 100 mcg by mouth daily.    [provider]       Exam Gen tired appearing, bruising of L eye and L forehead No rash, cyanosis or gangrene Sclera anicteric, throat dry No jvd or bruits Chest clear bilat ant / lat and post, R upper chest port intact w/o erythema RRR no MRG Abd soft ntnd no mass or ascites +bs GU normal male  MS no joint effusions or deformity, L hip tender to palpation Ext no LE or UE edema, no wounds or bruising to UE/ LE Neuro is withdrawn, not able to answer orientation questions, will hold each arm up for several seconds and the R leg.  Will not attempt to hold L leg up.     Home meds:  - asa 81/ lipitor 40  - xanax 1mg  qid/ neurontin 600 tid/ atarax 50mg  hs prn/ remeron 30mg  hs/ percocet tid prn/ compazine qid prn  - prn's/ vitamins/ supplements     CT C-spine - IMPRESSION: 1. No acute cervical spine findings. 2. Enlargement of 2 lymph nodes in the lower neck compared to the 09/03/2020 CT of the neck. This may be a manifestation of the patient's known malignancy. 3. Foraminal narrowing at C2-3 and C3-4 due to spurring. 4. Severe emphysema. 5. Carotid atherosclerosis. 6. Stable hypodensity along the left posterior nasopharynx, potentially a mucous retention cyst or necrotic lymph node.     CT maxillofacial - IMPRESSION: 1. Acute fracture the anterior wall of the left frontal sinus with overlying scalp hematoma. 2. Small left mastoid effusion.     CT head wo -  IMPRESSION: 1. Fracture of the anterior wall of the left frontal sinus, with overlying scalp hematoma. 2. No intracranial hemorrhage or specific acute intracranial findings. Periventricular white matter and corona radiata hypodensities favor chronic ischemic microvascular white matter disease. 3. Opacification of several left-sided mastoid air cells.     CT chest, abd pelvis w/ contrast - IMPRESSION: 1. Displaced, angulated, mildly comminuted left femoral neck fracture. 2. Nondisplaced left L3 transverse process fracture. 3. No additional acute traumatic injury to the chest, abdomen, or pelvis. 4. New frothy debris layering in the trachea and tracking into the left greater than right mainstem bronchi, retained secretions or Aspiration. 5. Multiple pulmonary nodules are grossly stable from CT last month. Please note the detailed nodule evaluation is limited on the current exam due to motion artifact. 6. Chronic findings of colonic diverticulosis without diverticulitis. Moderate emphysema.      Na 143  K 3.4  CO 24  BUN 50  Cr 1.14  Alb 4.2   Lipase 27   AST 209  ALT 117  Tbili 1.3  CPK 4232   Lactic acid 2.6  WBC 11K Hb 12.6    UA 20 ketone, neg LE/ nit, 100 prot, 6-10 rbc/ 0-5 wbc     Assessment/ Plan: 1. R/O sepsis - ^WBC and lactic acid after being found down on floor. Got 2 L IVF's , have ordered empiric IV abx as well.  F/u cultures and de-escalate as needed.  2. L hip fracture - ortho consulted. Consider IV pain meds if MS improving.   3. Left L3 transverse process fracture - per neurosurg 4. L frontal sinus fracture 5. Altered mental status - CT head w/o CVA or bleed. MRI attempted but not successful.  Moving both arms and R leg to command, L hip broken. Neuro  consulted. Needs swallow eval prior to ordering home meds.  6. Vol depletion - given hx and also ^BUN/creat ratio. Cont IVF"s at 125 cc/hr, sp 2 L bolus in ED.   7. Mild rhabdo - IVF"s as above.  8. COPD  9. H/o cancer - port  appears intact, no cytopenias. F/b Dr Lamonte Richer in [place] for small cell lung cancer w/ liver/ lung mets on chemo since 2018. Last visit 09/12/20.  10. Depression - resume home meds when able to swallow      Kelly Splinter  MD 10/10/2020, 6:21 PM

## 2020-10-10 NOTE — ED Notes (Signed)
Patient transported to CT 

## 2020-10-10 NOTE — ED Notes (Signed)
Patient transported to MRI 

## 2020-10-10 NOTE — ED Notes (Signed)
RN informed pts room 5n22 is still dirty, rn will be called when room is clean.

## 2020-10-11 ENCOUNTER — Encounter (HOSPITAL_COMMUNITY)
Admission: EM | Disposition: A | Discharge status: Skilled Nursing Facility | Payer: Self-pay | Source: Home / Self Care | Attending: Internal Medicine

## 2020-10-11 ENCOUNTER — Other Ambulatory Visit (HOSPITAL_COMMUNITY): Payer: Medicare HMO

## 2020-10-11 ENCOUNTER — Inpatient Hospital Stay (HOSPITAL_COMMUNITY): Payer: Medicare HMO

## 2020-10-11 ENCOUNTER — Encounter (HOSPITAL_COMMUNITY): Payer: Self-pay | Admitting: Anesthesiology

## 2020-10-11 DIAGNOSIS — G9340 Encephalopathy, unspecified: Secondary | ICD-10-CM | POA: Diagnosis not present

## 2020-10-11 DIAGNOSIS — G9341 Metabolic encephalopathy: Secondary | ICD-10-CM | POA: Diagnosis not present

## 2020-10-11 DIAGNOSIS — M6282 Rhabdomyolysis: Secondary | ICD-10-CM | POA: Diagnosis not present

## 2020-10-11 DIAGNOSIS — C3490 Malignant neoplasm of unspecified part of unspecified bronchus or lung: Secondary | ICD-10-CM | POA: Diagnosis not present

## 2020-10-11 DIAGNOSIS — L8990 Pressure ulcer of unspecified site, unspecified stage: Secondary | ICD-10-CM | POA: Diagnosis present

## 2020-10-11 DIAGNOSIS — R748 Abnormal levels of other serum enzymes: Secondary | ICD-10-CM | POA: Diagnosis not present

## 2020-10-11 DIAGNOSIS — J9601 Acute respiratory failure with hypoxia: Secondary | ICD-10-CM

## 2020-10-11 DIAGNOSIS — T796XXA Traumatic ischemia of muscle, initial encounter: Secondary | ICD-10-CM

## 2020-10-11 LAB — COMPREHENSIVE METABOLIC PANEL
ALT: 91 U/L — ABNORMAL HIGH (ref 0–44)
AST: 127 U/L — ABNORMAL HIGH (ref 15–41)
Albumin: 3.4 g/dL — ABNORMAL LOW (ref 3.5–5.0)
Alkaline Phosphatase: 52 U/L (ref 38–126)
Anion gap: 8 (ref 5–15)
BUN: 41 mg/dL — ABNORMAL HIGH (ref 8–23)
CO2: 19 mmol/L — ABNORMAL LOW (ref 22–32)
Calcium: 8.7 mg/dL — ABNORMAL LOW (ref 8.9–10.3)
Chloride: 116 mmol/L — ABNORMAL HIGH (ref 98–111)
Creatinine, Ser: 1.03 mg/dL (ref 0.61–1.24)
GFR, Estimated: >60 mL/min (ref >60)
Glucose, Bld: 93 mg/dL (ref 70–99)
Potassium: 3.5 mmol/L (ref 3.5–5.1)
Sodium: 143 mmol/L (ref 135–145)
Total Bilirubin: 1.3 mg/dL — ABNORMAL HIGH (ref 0.3–1.2)
Total Protein: 6.2 g/dL — ABNORMAL LOW (ref 6.5–8.1)

## 2020-10-11 LAB — BLOOD GAS, ARTERIAL
Acid-base deficit: 7.3 mmol/L — ABNORMAL HIGH (ref 0.0–2.0)
Acid-base deficit: 9.6 mmol/L — ABNORMAL HIGH (ref 0.0–2.0)
Bicarbonate: 16.6 mmol/L — ABNORMAL LOW (ref 20.0–28.0)
Bicarbonate: 18.2 mmol/L — ABNORMAL LOW (ref 20.0–28.0)
FIO2: 100
FIO2: 80
O2 Saturation: 71.9 %
O2 Saturation: 93 %
Patient temperature: 38.9
Patient temperature: 37
pCO2 arterial: 29.6 mmHg — ABNORMAL LOW (ref 32.0–48.0)
pCO2 arterial: 58.4 mmHg — ABNORMAL HIGH (ref 32.0–48.0)
pH, Arterial: 7.376 (ref 7.350–7.450)
pH, Arterial: 7.12 — CL (ref 7.350–7.450)
pO2, Arterial: 46.1 mmHg — ABNORMAL LOW (ref 83.0–108.0)
pO2, Arterial: 92 mmHg (ref 83.0–108.0)

## 2020-10-11 LAB — D-DIMER, QUANTITATIVE: D-Dimer, Quant: 3.17 ug/mL-FEU — ABNORMAL HIGH (ref 0.00–0.50)

## 2020-10-11 LAB — GLUCOSE, CAPILLARY
Glucose-Capillary: 200 mg/dL — ABNORMAL HIGH (ref 70–99)
Glucose-Capillary: 65 mg/dL — ABNORMAL LOW (ref 70–99)
Glucose-Capillary: 77 mg/dL (ref 70–99)
Glucose-Capillary: 93 mg/dL (ref 70–99)
Glucose-Capillary: 95 mg/dL (ref 70–99)

## 2020-10-11 LAB — CBC
HCT: 33.3 % — ABNORMAL LOW (ref 39.0–52.0)
Hemoglobin: 11 g/dL — ABNORMAL LOW (ref 13.0–17.0)
MCH: 32.4 pg (ref 26.0–34.0)
MCHC: 33 g/dL (ref 30.0–36.0)
MCV: 98.2 fL (ref 80.0–100.0)
Platelets: 136 10*3/uL — ABNORMAL LOW (ref 150–400)
RBC: 3.39 MIL/uL — ABNORMAL LOW (ref 4.22–5.81)
RDW: 19.3 % — ABNORMAL HIGH (ref 11.5–15.5)
WBC: 10 10*3/uL (ref 4.0–10.5)
nRBC: 0 % (ref 0.0–0.2)

## 2020-10-11 LAB — CK: Total CK: 1873 U/L — ABNORMAL HIGH (ref 49–397)

## 2020-10-11 LAB — TROPONIN I (HIGH SENSITIVITY)
Troponin I (High Sensitivity): 77 ng/L — ABNORMAL HIGH (ref <18)
Troponin I (High Sensitivity): 99 ng/L — ABNORMAL HIGH (ref <18)

## 2020-10-11 LAB — LACTIC ACID, PLASMA
Lactic Acid, Venous: 2.7 mmol/L (ref 0.5–1.9)
Lactic Acid, Venous: 0.7 mmol/L (ref 0.5–1.9)

## 2020-10-11 LAB — ECHOCARDIOGRAM COMPLETE
Height: 69 in
S' Lateral: 3.9 cm
Weight: 2352 oz

## 2020-10-11 LAB — MRSA PCR SCREENING: MRSA by PCR: NEGATIVE

## 2020-10-11 LAB — HIV ANTIBODY (ROUTINE TESTING W REFLEX): HIV Screen 4th Generation wRfx: NONREACTIVE

## 2020-10-11 SURGERY — HEMIARTHROPLASTY, HIP, DIRECT ANTERIOR APPROACH, FOR FRACTURE
Anesthesia: General | Site: Hip | Laterality: Left

## 2020-10-11 MED ORDER — ROCURONIUM BROMIDE 50 MG/5ML IV SOLN
60.0000 mg | Freq: Once | INTRAVENOUS | Status: AC
  Administered 2020-10-11: 60 mg via INTRAVENOUS

## 2020-10-11 MED ORDER — FENTANYL CITRATE (PF) 100 MCG/2ML IJ SOLN
25.0000 ug | Freq: Once | INTRAMUSCULAR | Status: AC
  Administered 2020-10-11: 25 ug via INTRAVENOUS

## 2020-10-11 MED ORDER — SODIUM CHLORIDE 0.9% FLUSH
10.0000 mL | Freq: Two times a day (BID) | INTRAVENOUS | Status: DC
  Administered 2020-10-11 – 2020-10-13 (×3): 10 mL
  Administered 2020-10-13: 20 mL
  Administered 2020-10-14 – 2020-10-20 (×11): 10 mL
  Administered 2020-10-20: 40 mL
  Administered 2020-10-21: 10 mL
  Administered 2020-10-21: 40 mL
  Administered 2020-10-22: 30 mL
  Administered 2020-10-23 – 2020-11-10 (×14): 10 mL

## 2020-10-11 MED ORDER — FENTANYL BOLUS VIA INFUSION
25.0000 ug | INTRAVENOUS | Status: DC | PRN
Start: 2020-10-11 — End: 2020-10-12
  Filled 2020-10-11: qty 100

## 2020-10-11 MED ORDER — DEXTROSE 50 % IV SOLN
INTRAVENOUS | Status: AC
  Administered 2020-10-11: 50 mL
  Filled 2020-10-11: qty 50

## 2020-10-11 MED ORDER — CHLORHEXIDINE GLUCONATE 0.12% ORAL RINSE (MEDLINE KIT)
15.0000 mL | Freq: Two times a day (BID) | OROMUCOSAL | Status: DC
  Administered 2020-10-11 – 2020-10-12 (×2): 15 mL via OROMUCOSAL

## 2020-10-11 MED ORDER — FENTANYL CITRATE (PF) 100 MCG/2ML IJ SOLN: 25.0000 ug | INTRAMUSCULAR | Status: DC | PRN

## 2020-10-11 MED ORDER — HYDRALAZINE HCL 20 MG/ML IJ SOLN: 10.0000 mg | INTRAMUSCULAR | Status: DC | PRN

## 2020-10-11 MED ORDER — GABAPENTIN 250 MG/5ML PO SOLN
300.0000 mg | Freq: Three times a day (TID) | ORAL | Status: DC
  Administered 2020-10-11 – 2020-10-12 (×3): 300 mg
  Filled 2020-10-11 (×5): qty 6

## 2020-10-11 MED ORDER — FENTANYL CITRATE (PF) 100 MCG/2ML IJ SOLN
100.0000 ug | Freq: Once | INTRAMUSCULAR | Status: AC
  Administered 2020-10-11: 100 ug via INTRAVENOUS
  Filled 2020-10-11: qty 2

## 2020-10-11 MED ORDER — DOCUSATE SODIUM 50 MG/5ML PO LIQD
100.0000 mg | Freq: Two times a day (BID) | ORAL | Status: DC
  Administered 2020-10-11: 100 mg
  Filled 2020-10-11: qty 10

## 2020-10-11 MED ORDER — ETOMIDATE 2 MG/ML IV SOLN
INTRAVENOUS | Status: AC
  Filled 2020-10-11: qty 20

## 2020-10-11 MED ORDER — FENTANYL CITRATE (PF) 100 MCG/2ML IJ SOLN
25.0000 ug | INTRAMUSCULAR | Status: DC | PRN
  Administered 2020-10-11: 100 ug via INTRAVENOUS
  Filled 2020-10-11: qty 2

## 2020-10-11 MED ORDER — DOCUSATE SODIUM 50 MG/5ML PO LIQD: 100.0000 mg | Freq: Two times a day (BID) | ORAL | Status: DC

## 2020-10-11 MED ORDER — SODIUM CHLORIDE 0.9 % IV SOLN
2.0000 g | Freq: Three times a day (TID) | INTRAVENOUS | Status: DC
  Administered 2020-10-11 – 2020-10-15 (×12): 2 g via INTRAVENOUS
  Filled 2020-10-11 (×12): qty 2

## 2020-10-11 MED ORDER — ALBUTEROL SULFATE (2.5 MG/3ML) 0.083% IN NEBU
2.5000 mg | INHALATION_SOLUTION | RESPIRATORY_TRACT | Status: DC | PRN
  Administered 2020-10-16: 2.5 mg via RESPIRATORY_TRACT
  Filled 2020-10-11 (×2): qty 3

## 2020-10-11 MED ORDER — ORAL CARE MOUTH RINSE
15.0000 mL | OROMUCOSAL | Status: DC
  Administered 2020-10-11 – 2020-10-12 (×9): 15 mL via OROMUCOSAL

## 2020-10-11 MED ORDER — ROCURONIUM BROMIDE 10 MG/ML (PF) SYRINGE
PREFILLED_SYRINGE | INTRAVENOUS | Status: AC
  Filled 2020-10-11: qty 10

## 2020-10-11 MED ORDER — POLYETHYLENE GLYCOL 3350 17 G PO PACK: 17.0000 g | PACK | Freq: Every day | ORAL | Status: DC

## 2020-10-11 MED ORDER — ETOMIDATE 2 MG/ML IV SOLN
20.0000 mg | Freq: Once | INTRAVENOUS | Status: AC
  Administered 2020-10-11: 20 mg via INTRAVENOUS

## 2020-10-11 MED ORDER — FENTANYL CITRATE (PF) 100 MCG/2ML IJ SOLN
INTRAMUSCULAR | Status: AC
  Administered 2020-10-11: 100 ug
  Filled 2020-10-11: qty 2

## 2020-10-11 MED ORDER — FENTANYL 2500MCG IN NS 250ML (10MCG/ML) PREMIX INFUSION
25.0000 ug/h | INTRAVENOUS | Status: DC
  Administered 2020-10-11: 25 ug/h via INTRAVENOUS
  Filled 2020-10-11: qty 250

## 2020-10-11 MED ORDER — DEXMEDETOMIDINE HCL IN NACL 400 MCG/100ML IV SOLN
0.0000 ug/kg/h | INTRAVENOUS | Status: DC
  Administered 2020-10-11: 0.4 ug/kg/h via INTRAVENOUS
  Administered 2020-10-12 (×2): 0.8 ug/kg/h via INTRAVENOUS
  Filled 2020-10-11 (×4): qty 100

## 2020-10-11 MED ORDER — CHLORHEXIDINE GLUCONATE CLOTH 2 % EX PADS
6.0000 | MEDICATED_PAD | Freq: Every day | CUTANEOUS | Status: DC
  Administered 2020-10-11 – 2020-11-10 (×32): 6 via TOPICAL

## 2020-10-11 MED ORDER — CHLORHEXIDINE GLUCONATE 4 % EX LIQD
60.0000 mL | Freq: Once | CUTANEOUS | Status: AC
  Administered 2020-10-12: 4 via TOPICAL
  Filled 2020-10-11: qty 60

## 2020-10-11 MED ORDER — LACTATED RINGERS IV BOLUS
1000.0000 mL | Freq: Once | INTRAVENOUS | Status: AC
  Administered 2020-10-11: 1000 mL via INTRAVENOUS

## 2020-10-11 MED ORDER — POVIDONE-IODINE 10 % EX SWAB: 2.0000 "application " | Freq: Once | CUTANEOUS | Status: DC

## 2020-10-11 MED ORDER — MIDAZOLAM HCL 2 MG/2ML IJ SOLN
INTRAMUSCULAR | Status: AC
  Filled 2020-10-11: qty 2

## 2020-10-11 MED ORDER — MIDAZOLAM HCL 2 MG/2ML IJ SOLN
2.0000 mg | Freq: Once | INTRAMUSCULAR | Status: AC
  Administered 2020-10-11: 2 mg via INTRAVENOUS

## 2020-10-11 MED ORDER — CEFAZOLIN SODIUM-DEXTROSE 2-4 GM/100ML-% IV SOLN
2.0000 g | INTRAVENOUS | Status: AC
  Filled 2020-10-11: qty 100

## 2020-10-11 MED ORDER — POLYETHYLENE GLYCOL 3350 17 G PO PACK
17.0000 g | PACK | Freq: Every day | ORAL | Status: DC
  Administered 2020-10-11: 17 g
  Filled 2020-10-11: qty 1

## 2020-10-11 MED ORDER — SODIUM CHLORIDE 0.9% FLUSH
10.0000 mL | INTRAVENOUS | Status: DC | PRN
  Administered 2020-11-07: 10 mL

## 2020-10-11 NOTE — Progress Notes (Signed)
  Echocardiogram 2D Echocardiogram has been performed.  [name] 10/11/2020, 5:32 PM

## 2020-10-11 NOTE — Progress Notes (Signed)
Pt O2 sats dropped to 80 on 2L Nasal Cannula. Turned O2 up to 6 liters and O2 sats improved very little. Put pt on non-rebreather, pt improved a little, but started declining shorlty after. Md was notified and came to look at patient. [facility] was called and they came quickly. Pt was still declining and decision was made to transfer pt. Pt transferred to 57M.

## 2020-10-11 NOTE — Procedures (Signed)
Diagnostic and Therapeutic Bronchoscopy Procedure Note  XIONG HAIDAR  [id number]  09/04/1950  Date:10/11/20  Time:12:49 PM   Provider Performing:Myria Steenbergen Rodman Pickle   Procedure(s):  Flexible bronchoscopy with bronchial alveolar lavage [phone]) and Initial Therapeutic Aspiration of Tracheobronchial Tree [id number])  Indication(s) Acute hypoxemic respiratory failure Left lower lobe atelectasis secondary to debris in airways  Consent Risks of the procedure as well as the alternatives and risks of each were explained to the patient and/or caregiver.  Consent for the procedure was obtained and is signed in the bedside chart  Anesthesia Fentanyl    Time Out Verified patient identification, verified procedure, site/side was marked, verified correct patient position, special equipment/implants available, medications/allergies/relevant history reviewed, required imaging and test results available.   Sterile Technique Usual hand hygiene, masks, gowns, and gloves were used   Procedure Description Bronchoscope advanced through endotracheal tube and into airway.  Airways were examined down to subsegmental level with findings noted below.   Thick frothy secretions bilaterally with occlusion of left mainstem and subsequent airways. Following diagnostic evaluation, BAL performed in the LUL with 20cc instilled and 10 cc cloudy fluid returned. Therapeutic aspiration performed and thick white secretions suctioned and cleared from airway.  Findings:  Thick white frothy secretions s/p removal S/p BAL of LUL  Complications/Tolerance None; patient tolerated the procedure well. Chest X-ray is not needed post procedure.   EBL Minimal   Specimen(s) BAL  Rodman Pickle, M.D. [facility] Pulmonary/Critical Care Medicine 10/11/2020 12:55 PM

## 2020-10-11 NOTE — Consult Note (Addendum)
Reason for Consult: left frontal sinus fx and lumbar TP fx Referring Physician: EDP  DEMARQUEZ CIOLEK is an 70 y.o. male.   HPI:  70 year old male presented to the ED last night after being found down after 3 days. He came in with rhabdomyolysis and a hip fracture. He has a very extensive and complicated medical history. Patient complains of some headaches but no NV dizziness or vision changes.   Past Medical History:  Diagnosis Date  . Anxiety   . CAD (coronary artery disease)   . Cancer ([place])    stage 4 small cell lung cancer   . COPD (chronic obstructive pulmonary disease) ([place])   . Depression   . Dyspnea    increased exertion  . Feeling of chest tightness   . Heart palpitations   . History of chemotherapy   . Myocardial infarction ([place])   . Osteopenia   . Panic attacks   . Smoker     Past Surgical History:  Procedure Laterality Date  . BACK SURGERY  12/24/2000   L5,S1  . CORONARY STENT PLACEMENT  2005   RCA & CX  . HERNIA REPAIR Right 1980's  . INGUINAL HERNIA REPAIR  12/1978   right side  . IR FLUORO GUIDE PORT INSERTION RIGHT  04/02/2017  . IR US GUIDE BX ASP/DRAIN  02/03/2017  . IR US GUIDE VASC ACCESS RIGHT  04/02/2017  . NM MYOCAR PERF WALL MOTION  09/07/2009   No ischemia; EF 51%  . SHOULDER SURGERY Left 08/2010  . SPINE SURGERY  2002   L5-S1    Allergies  Allergen Reactions  . Codeine Nausea Only  . Niaspan Durene Cal Er]     Social History   Tobacco Use  . Smoking status: Current Every Day Smoker    Packs/day: 1.00    Types: Cigarettes  . Smokeless tobacco: Never Used  Substance Use Topics  . Alcohol use: Yes    Comment: occas    Family History  Problem Relation Age of Onset  . Heart attack Father   . Kidney disease Father        renal failure  . Heart failure Mother   . Heart attack Mother   . Cancer Brother   . Diabetes Brother   . Alcohol abuse Brother   . Diabetes Sister      Review of Systems  Positive ROS: as above  All other systems  have been reviewed and were otherwise negative with the exception of those mentioned in the HPI and as above.  Objective: Vital signs in last 24 hours: Temp:  [97.4 F (36.3 C)-98.9 F (37.2 C)] 98.9 F (37.2 C) (03/31 0641) Pulse Rate:  [63-88] 81 (03/31 0641) Resp:  [16-32] 18 (03/31 0641) BP: (139-171)/(58-75) 160/72 (03/31 0641) SpO2:  [89 %-96 %] 91 % (03/31 0641) Weight:  [66.7 kg] 66.7 kg (03/30 1538)  General Appearance: Alert, cooperative, no distress, appears stated age Head: Normocephalic, without obvious abnormality, atraumatic Eyes: PERRL, conjunctiva/corneas clear, EOM's intact, fundi benign, both eyes      Lungs:respirations unlabored Heart: Regular rate and rhythm, Skin: Skin color, texture, turgor normal, no rashes or lesions  NEUROLOGIC:   Mental status: A&O x4, no aphasia, good attention span, Memory and fund of knowledge Motor Exam - grossly normal, normal tone and bulk Sensory Exam - grossly normal Reflexes: symmetric, no pathologic reflexes, No Hoffman's, No clonus Coordination - grossly normal Gait -not tested Balance - not tested Cranial Nerves: I: smell Not  tested  II: visual acuity  OS: na  OD: na  II: visual fields Full to confrontation  II: pupils Equal, round, reactive to light  III,VII: ptosis None  III,IV,VI: extraocular muscles  Full ROM  V: mastication   V: facial light touch sensation    V,VII: corneal reflex    VII: facial muscle function - upper    VII: facial muscle function - lower   VIII: hearing   IX: soft palate elevation    IX,X: gag reflex   XI: trapezius strength    XI: sternocleidomastoid strength   XI: neck flexion strength    XII: tongue strength      Data Review Lab Results  Component Value Date   WBC 10.0 10/11/2020   HGB 11.0 (L) 10/11/2020   HCT 33.3 (L) 10/11/2020   MCV 98.2 10/11/2020   PLT 136 (L) 10/11/2020   Lab Results  Component Value Date   NA 143 10/11/2020   K 3.5 10/11/2020   CL 116 (H)  10/11/2020   CO2 19 (L) 10/11/2020   BUN 41 (H) 10/11/2020   CREATININE 1.03 10/11/2020   GLUCOSE 93 10/11/2020   Lab Results  Component Value Date   INR 1.2 10/10/2020    Radiology: CT Angio Head W or Wo Contrast  Result Date: 10/10/2020 CLINICAL DATA:  Fall. History of extensive stage small cell lung cancer. EXAM: CT ANGIOGRAPHY HEAD AND NECK TECHNIQUE: Multidetector CT imaging of the head and neck was performed using the standard protocol during bolus administration of intravenous contrast. Multiplanar CT image reconstructions and MIPs were obtained to evaluate the vascular anatomy. Carotid stenosis measurements (when applicable) are obtained utilizing NASCET criteria, using the distal internal carotid diameter as the denominator. CONTRAST:  62mL OMNIPAQUE IOHEXOL 350 MG/ML SOLN COMPARISON:  Head, cervical spine, and maxillofacial CTs earlier today. Neck CT 09/03/2020. No prior angiographic imaging. FINDINGS: CTA NECK FINDINGS Aortic arch: Standard 3 vessel aortic arch with mild atherosclerotic plaque. No significant arch vessel origin stenosis. Right carotid system: Patent with soft and calcified plaque in the carotid bulb. 65% stenosis of the ICA at the distal aspect of the bulb. Left carotid system: Patent with predominantly calcified plaque in the carotid bulb. No evidence of significant stenosis or dissection. Vertebral arteries: Patent without evidence of dissection or a significant stenosis on the left. Calcified plaque at the right vertebral artery origin results in moderate stenosis. Skeleton: Mild compression fractures in the upper thoracic spine, also present on a chest CT last month. Severe facet arthrosis on the left at C2-3 and on the right at C3-4 with trace anterolisthesis at the latter. Fracture involving the anterior wall of the left frontal sinus as described on earlier maxillofacial CT. Other neck: 1.3 cm short axis superficial lymph node along the right lower neck and 1.2 cm  short axis left level V lymph node as noted on today's earlier cervical spine CT, both larger than on the 09/03/2020 neck CT. 1.9 x 1.3 cm low-density focus in the left nasopharyngeal region, unchanged from the prior neck CT and potentially reflecting a necrotic retropharyngeal lymph node. Unchanged 0.8 cm right parotid nodule. Upper chest: Severe emphysema. Review of the MIP images confirms the above findings CTA HEAD FINDINGS Anterior circulation: The internal carotid arteries are patent from skull base to carotid termini with minimal nonstenotic plaque. ACAs and MCAs are patent without evidence of a proximal branch occlusion or significant proximal stenosis. The right A1 segment is mildly hypoplastic. No aneurysm is identified. Posterior  circulation: The intracranial vertebral arteries are patent to the basilar with mild irregularity but no flow limiting stenosis. 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 There is a small right posterior communicating artery. Both PCAs are patent without evidence of a significant proximal stenosis. No aneurysm is identified. Venous sinuses: Patent. Anatomic variants: None of significance. Review of the MIP images confirms the above findings IMPRESSION: 1. No large vessel occlusion or significant proximal intracranial stenosis. 2. 65% proximal right ICA stenosis. 3. Moderate right vertebral artery origin stenosis. 4. Bilateral lower neck lymphadenopathy, mildly progressed from the 09/03/2020 neck CT. 5. Aortic Atherosclerosis (ICD10-I70.0) and Emphysema (ICD10-J43.9). Electronically Signed   By: Logan Bores M.D.   On: 10/10/2020 19:54   CT Head Wo Contrast  Result Date: 10/10/2020 CLINICAL DATA:  Fall on bathroom, laceration along the head EXAM: CT HEAD WITHOUT CONTRAST TECHNIQUE: Contiguous axial images were obtained from the base of the skull through the vertex without intravenous contrast. COMPARISON:  09/07/2019 FINDINGS:  Brain: The brainstem, cerebellum, cerebral peduncles, thalami, basal ganglia, basilar cisterns, and ventricular system appear within normal limits. Periventricular white matter and corona radiata hypodensities favor chronic ischemic microvascular white matter disease. No intracranial hemorrhage, mass lesion, or acute CVA. Vascular: There is atherosclerotic calcification of the cavernous carotid arteries bilaterally. Skull: Left frontal fracture along the anterior margin of the frontal sinus underlying the scalp hematoma as shown on image 59 series 4. Sinuses/Orbits: There is opacification of several left-sided mastoid air cells. Although there is a fracture of the anterior margin of the left frontal sinus, no fluid is identified in the sinus at this time. Left periorbital hematoma without observed intraorbital abnormality. Other: Left forehead scalp hematoma overlying the frontal fracture. IMPRESSION: 1. Fracture of the anterior wall of the left frontal sinus, with overlying scalp hematoma. 2. No intracranial hemorrhage or specific acute intracranial findings. Periventricular white matter and corona radiata hypodensities favor chronic ischemic microvascular white matter disease. 3. Opacification of several left-sided mastoid air cells. Electronically Signed   By: Van Clines M.D.   On: 10/10/2020 17:06   CT Angio Neck W and/or Wo Contrast  Result Date: 10/10/2020 CLINICAL DATA:  Fall. History of extensive stage small cell lung cancer. EXAM: CT ANGIOGRAPHY HEAD AND NECK TECHNIQUE: Multidetector CT imaging of the head and neck was performed using the standard protocol during bolus administration of intravenous contrast. Multiplanar CT image reconstructions and MIPs were obtained to evaluate the vascular anatomy. Carotid stenosis measurements (when applicable) are obtained utilizing NASCET criteria, using the distal internal carotid diameter as the denominator. CONTRAST:  39mL OMNIPAQUE IOHEXOL 350 MG/ML  SOLN COMPARISON:  Head, cervical spine, and maxillofacial CTs earlier today. Neck CT 09/03/2020. No prior angiographic imaging. FINDINGS: CTA NECK FINDINGS Aortic arch: Standard 3 vessel aortic arch with mild atherosclerotic plaque. No significant arch vessel origin stenosis. Right carotid system: Patent with soft and calcified plaque in the carotid bulb. 65% stenosis of the ICA at the distal aspect of the bulb. Left carotid system: Patent with predominantly calcified plaque in the carotid bulb. No evidence of significant stenosis or dissection. Vertebral arteries: Patent without evidence of dissection or a significant stenosis on the left. Calcified plaque at the right vertebral artery origin results in moderate stenosis. Skeleton: Mild compression fractures in the upper thoracic spine, also present on a chest CT last month. Severe facet arthrosis on the left at C2-3 and on the right at C3-4 with trace anterolisthesis at the latter. Fracture involving  the anterior wall of the left frontal sinus as described on earlier maxillofacial CT. Other neck: 1.3 cm short axis superficial lymph node along the right lower neck and 1.2 cm short axis left level V lymph node as noted on today's earlier cervical spine CT, both larger than on the 09/03/2020 neck CT. 1.9 x 1.3 cm low-density focus in the left nasopharyngeal region, unchanged from the prior neck CT and potentially reflecting a necrotic retropharyngeal lymph node. Unchanged 0.8 cm right parotid nodule. Upper chest: Severe emphysema. Review of the MIP images confirms the above findings CTA HEAD FINDINGS Anterior circulation: The internal carotid arteries are patent from skull base to carotid termini with minimal nonstenotic plaque. ACAs and MCAs are patent without evidence of a proximal branch occlusion or significant proximal stenosis. The right A1 segment is mildly hypoplastic. No aneurysm is identified. Posterior circulation: The intracranial vertebral arteries are  patent to the basilar with mild irregularity but no flow limiting stenosis. 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 There is a small right posterior communicating artery. Both PCAs are patent without evidence of a significant proximal stenosis. No aneurysm is identified. Venous sinuses: Patent. Anatomic variants: None of significance. Review of the MIP images confirms the above findings IMPRESSION: 1. No large vessel occlusion or significant proximal intracranial stenosis. 2. 65% proximal right ICA stenosis. 3. Moderate right vertebral artery origin stenosis. 4. Bilateral lower neck lymphadenopathy, mildly progressed from the 09/03/2020 neck CT. 5. Aortic Atherosclerosis (ICD10-I70.0) and Emphysema (ICD10-J43.9). Electronically Signed   By: Logan Bores M.D.   On: 10/10/2020 19:54   CT Chest W Contrast  Result Date: 10/10/2020 CLINICAL DATA:  Fall in the bathroom. Left hip pain. Stage IV small cell lung cancer. EXAM: CT CHEST, ABDOMEN, AND PELVIS WITH CONTRAST TECHNIQUE: Multidetector CT imaging of the chest, abdomen and pelvis was performed following the standard protocol during bolus administration of intravenous contrast. CONTRAST:  80 cc Omnipaque 300 IV COMPARISON:  CT chest abdomen pelvis 09/03/2020. FINDINGS: CT CHEST FINDINGS Cardiovascular: No evidence of aortic or acute vascular injury. Moderate aortic atherosclerosis. Right chest port with tip in the lower SVC. Normal heart size with coronary artery calcifications. No pericardial fluid. Mediastinum/Nodes: No mediastinal hemorrhage or hematoma. No enlarged mediastinal or hilar lymph nodes. No pneumomediastinum. Decompressed esophagus. No thyroid nodule. Lungs/Pleura: No pneumothorax or pulmonary contusion. Moderately advanced emphysema. New from prior exam is frothy debris layering in the trachea and tracking into the left greater than right mainstem bronchi. Dependent opacity at the left lung base favored  to be atelectasis. Multiple pulmonary nodules are again seen. Accurate measurement of these nodules is limited on the current exam due to motion. Dominant nodules are in the right lower lobe on series 5, image 117 and 123, and in the left lower lobe image 119. These are grossly stable, but again not well measured. No pleural fluid. Musculoskeletal: Breathing motion limits detailed rib assessment. No displaced rib fracture. No evidence of acute fracture of the sternum, included clavicles, or shoulder girdles. No fracture of the thoracic spine. No evidence of focal bone lesion. No confluent chest wall abnormality. CT ABDOMEN PELVIS FINDINGS Hepatobiliary: No hepatic injury or perihepatic hematoma. Tiny hypodense lesion within segment 4 of the liver is too small to characterize but unchanged. Unremarkable gallbladder. Pancreas: No evidence of injury. No ductal dilatation or inflammation. Spleen: No splenic injury or perisplenic hematoma. Splenule anteriorly. Adrenals/Urinary Tract: No adrenal nodule. No adrenal hemorrhage. No evidence of renal injury. No  hydronephrosis. There are bilateral renal cysts as well as low-density lesions too small to accurately characterize, grossly stable from CT last month. Punctate calcification in the anterior dome of the bladder is unchanged. No bladder wall thickening or injury Stomach/Bowel: No evidence of bowel injury or acute abnormality. Lack of enteric contrast as well as patient motion limits detailed assessment. Decompressed stomach. No bowel inflammation. There is no free air. Normal appendix. Sigmoid diverticulosis without diverticulitis. Vascular/Lymphatic: No vascular injury. No acute vascular findings. Moderate aortic atherosclerosis without acute aortic abnormality. IVC is intact. No retroperitoneal fluid. No abdominopelvic adenopathy. Reproductive: Prostate is unremarkable. Other: No ascites or free fluid. No free air. Confluent body wall contusion. Musculoskeletal:  Displaced, angulated mildly comminuted left femoral neck fracture. No obvious underlying bone lesion on CT. The remainder of the bony pelvis is intact. Nondisplaced left L3 transverse process fracture. IMPRESSION: 1. Displaced, angulated, mildly comminuted left femoral neck fracture. 2. Nondisplaced left L3 transverse process fracture. 3. No additional acute traumatic injury to the chest, abdomen, or pelvis. 4. New frothy debris layering in the trachea and tracking into the left greater than right mainstem bronchi, retained secretions or aspiration. 5. Multiple pulmonary nodules are grossly stable from CT last month. Please note the detailed nodule evaluation is limited on the current exam due to motion artifact. 6. Chronic findings of colonic diverticulosis without diverticulitis. Moderate emphysema. Aortic Atherosclerosis (ICD10-I70.0) and Emphysema (ICD10-J43.9). Electronically Signed   By: Keith Rake M.D.   On: 10/10/2020 17:14   CT Cervical Spine Wo Contrast  Result Date: 10/10/2020 CLINICAL DATA:  Fall, head injury. EXAM: CT CERVICAL SPINE WITHOUT CONTRAST TECHNIQUE: Multidetector CT imaging of the cervical spine was performed without intravenous contrast. Multiplanar CT image reconstructions were also generated. COMPARISON:  CT neck dated 09/03/2020 FINDINGS: Alignment: No vertebral subluxation is observed. Skull base and vertebrae: No cervical spine fracture or acute bony findings. Soft tissues and spinal canal: Atherosclerotic calcification of the carotid arteries and branch vessels. Superficial lymph node along the right lower neck measures 1.3 cm in short axis on image 69 of series 5, previously 1.0 cm. Hypodensity along the left posterior nasopharyngeal margin measuring 2.1 by 1.8 cm, similar to the prior exam. Left level V lymph node measures 1.2 cm in short axis on image 64 series 5, previously 0.9 cm. Disc levels: Mild left foraminal stenosis at C2-3, and right foraminal stenosis at C3-4 due  to facet spurring. Upper chest: Severe emphysema in the lung apices. Biapical pleuroparenchymal scarring. Mucus in the trachea. Other: Right central line partially observed. IMPRESSION: 1. No acute cervical spine findings. 2. Enlargement of 2 lymph nodes in the lower neck compared to the 09/03/2020 CT of the neck. This may be a manifestation of the patient's known malignancy. 3. Foraminal narrowing at C2-3 and C3-4 due to spurring. 4. Severe emphysema. 5. Carotid atherosclerosis. 6. Stable hypodensity along the left posterior nasopharynx, potentially a mucous retention cyst or necrotic lymph node. Electronically Signed   By: Van Clines M.D.   On: 10/10/2020 17:13   CT ABDOMEN PELVIS W CONTRAST  Result Date: 10/10/2020 CLINICAL DATA:  Fall in the bathroom. Left hip pain. Stage IV small cell lung cancer. EXAM: CT CHEST, ABDOMEN, AND PELVIS WITH CONTRAST TECHNIQUE: Multidetector CT imaging of the chest, abdomen and pelvis was performed following the standard protocol during bolus administration of intravenous contrast. CONTRAST:  80 cc Omnipaque 300 IV COMPARISON:  CT chest abdomen pelvis 09/03/2020. FINDINGS: CT CHEST FINDINGS Cardiovascular: No evidence of aortic or  acute vascular injury. Moderate aortic atherosclerosis. Right chest port with tip in the lower SVC. Normal heart size with coronary artery calcifications. No pericardial fluid. Mediastinum/Nodes: No mediastinal hemorrhage or hematoma. No enlarged mediastinal or hilar lymph nodes. No pneumomediastinum. Decompressed esophagus. No thyroid nodule. Lungs/Pleura: No pneumothorax or pulmonary contusion. Moderately advanced emphysema. New from prior exam is frothy debris layering in the trachea and tracking into the left greater than right mainstem bronchi. Dependent opacity at the left lung base favored to be atelectasis. Multiple pulmonary nodules are again seen. Accurate measurement of these nodules is limited on the current exam due to motion.  Dominant nodules are in the right lower lobe on series 5, image 117 and 123, and in the left lower lobe image 119. These are grossly stable, but again not well measured. No pleural fluid. Musculoskeletal: Breathing motion limits detailed rib assessment. No displaced rib fracture. No evidence of acute fracture of the sternum, included clavicles, or shoulder girdles. No fracture of the thoracic spine. No evidence of focal bone lesion. No confluent chest wall abnormality. CT ABDOMEN PELVIS FINDINGS Hepatobiliary: No hepatic injury or perihepatic hematoma. Tiny hypodense lesion within segment 4 of the liver is too small to characterize but unchanged. Unremarkable gallbladder. Pancreas: No evidence of injury. No ductal dilatation or inflammation. Spleen: No splenic injury or perisplenic hematoma. Splenule anteriorly. Adrenals/Urinary Tract: No adrenal nodule. No adrenal hemorrhage. No evidence of renal injury. No hydronephrosis. There are bilateral renal cysts as well as low-density lesions too small to accurately characterize, grossly stable from CT last month. Punctate calcification in the anterior dome of the bladder is unchanged. No bladder wall thickening or injury Stomach/Bowel: No evidence of bowel injury or acute abnormality. Lack of enteric contrast as well as patient motion limits detailed assessment. Decompressed stomach. No bowel inflammation. There is no free air. Normal appendix. Sigmoid diverticulosis without diverticulitis. Vascular/Lymphatic: No vascular injury. No acute vascular findings. Moderate aortic atherosclerosis without acute aortic abnormality. IVC is intact. No retroperitoneal fluid. No abdominopelvic adenopathy. Reproductive: Prostate is unremarkable. Other: No ascites or free fluid. No free air. Confluent body wall contusion. Musculoskeletal: Displaced, angulated mildly comminuted left femoral neck fracture. No obvious underlying bone lesion on CT. The remainder of the bony pelvis is intact.  Nondisplaced left L3 transverse process fracture. IMPRESSION: 1. Displaced, angulated, mildly comminuted left femoral neck fracture. 2. Nondisplaced left L3 transverse process fracture. 3. No additional acute traumatic injury to the chest, abdomen, or pelvis. 4. New frothy debris layering in the trachea and tracking into the left greater than right mainstem bronchi, retained secretions or aspiration. 5. Multiple pulmonary nodules are grossly stable from CT last month. Please note the detailed nodule evaluation is limited on the current exam due to motion artifact. 6. Chronic findings of colonic diverticulosis without diverticulitis. Moderate emphysema. Aortic Atherosclerosis (ICD10-I70.0) and Emphysema (ICD10-J43.9). Electronically Signed   By: Keith Rake M.D.   On: 10/10/2020 17:14   CT Maxillofacial Wo Contrast  Result Date: 10/10/2020 CLINICAL DATA:  Facial trauma EXAM: CT MAXILLOFACIAL WITHOUT CONTRAST TECHNIQUE: Multidetector CT imaging of the maxillofacial structures was performed. Multiplanar CT image reconstructions were also generated. COMPARISON:  CT head 10/10/2020 FINDINGS: Osseous: Acute fracture of the anterior wall of the left frontal sinus with overlying scalp hematoma as on image 75 series 5. No significant fluid is currently present in the sinus. No other facial fractures are identified. Orbits: Left periorbital hematoma but without intraorbital abnormality observed. Sinuses: As noted above there is a fracture along the  anterior wall of the left frontal sinus but without a fluid level in the sinus. Opacification of several mastoid air cells noted compatible with a small left mastoid effusion. Soft tissues: Left forehead scalp hematoma extending into the periorbital region. Limited intracranial: Please see CT head report. IMPRESSION: 1. Acute fracture the anterior wall of the left frontal sinus with overlying scalp hematoma. 2. Small left mastoid effusion. Electronically Signed   By:  Van Clines M.D.   On: 10/10/2020 17:15     Assessment/Plan: 70 year old male presented to the ED last night after being down for 3 days. CT head shows an anterior wall left frontal sinus fracture. He also has a Left L3 TP fracture. No neurosurgical intervention warranted at this time. REcommend ENT input for frontal sinus fracture. We will sign off for now. Please call with any questions.   Agree with above   Ocie Cornfield [facility] 10/11/2020 8:03 AM

## 2020-10-11 NOTE — Consult Note (Signed)
Reason for Consult:Left hip fx Referring Physician: Dillard Cannon Time called: 0801 Time at bedside: [name] is an 70 y.o. male.  HPI: [name] fell at home. He is unable to describe the circumstances of the fall. He was discovered when his brother was unable to contact him for 24h and may have been on the floor for as long as 3d. He had mental status changes and could not really contribute to history as he is still only to answer yes/no questions and follow commands. He does admit to left hip pain. Workup showed a left hip fx in addition to other injuries and orthopedic surgery was consulted.   Past Medical History:  Diagnosis Date  . Anxiety   . CAD (coronary artery disease)   . Cancer ([place])    stage 4 small cell lung cancer   . COPD (chronic obstructive pulmonary disease) ([place])   . Depression   . Dyspnea    increased exertion  . Feeling of chest tightness   . Heart palpitations   . History of chemotherapy   . Myocardial infarction ([place])   . Osteopenia   . Panic attacks   . Smoker     Past Surgical History:  Procedure Laterality Date  . BACK SURGERY  12/24/2000   L5,S1  . CORONARY STENT PLACEMENT  2005   RCA & CX  . HERNIA REPAIR Right 1980's  . INGUINAL HERNIA REPAIR  12/1978   right side  . IR FLUORO GUIDE PORT INSERTION RIGHT  04/02/2017  . IR US GUIDE BX ASP/DRAIN  02/03/2017  . IR US GUIDE VASC ACCESS RIGHT  04/02/2017  . NM MYOCAR PERF WALL MOTION  09/07/2009   No ischemia; EF 51%  . SHOULDER SURGERY Left 08/2010  . SPINE SURGERY  2002   L5-S1    Family History  Problem Relation Age of Onset  . Heart attack Father   . Kidney disease Father        renal failure  . Heart failure Mother   . Heart attack Mother   . Cancer Brother   . Diabetes Brother   . Alcohol abuse Brother   . Diabetes Sister     Social History:  reports that he has been smoking cigarettes. He has been smoking about 1.00 pack per day. He has never used smokeless tobacco. He reports  current alcohol use. He reports that he does not use drugs.  Allergies:  Allergies  Allergen Reactions  . Codeine Nausea Only  . Niaspan [Niacin Er]     Medications: I have reviewed the patient's current medications.  Results for orders placed or performed during the hospital encounter of 10/10/20 (from the past 48 hour(s))  Lactic acid, plasma     Status: Abnormal   Collection Time: 10/10/20  4:00 PM  Result Value Ref Range   Lactic Acid, Venous 2.6 (HH) 0.5 - 1.9 mmol/L    Comment: CRITICAL RESULT CALLED TO, READ BACK BY AND VERIFIED WITH: C.STRAUGHAN RN 1729 10/10/20 MCCORMICK K Performed at [name] [address] [name] [address]   Comprehensive metabolic panel     Status: Abnormal   Collection Time: 10/10/20  4:02 PM  Result Value Ref Range   Sodium 143 135 - 145 mmol/L   Potassium 3.4 (L) 3.5 - 5.1 mmol/L   Chloride 106 98 - 111 mmol/L   CO2 24 22 - 32 mmol/L   Glucose, Bld 120 (H) 70 - 99 mg/dL  Comment: Glucose reference range applies only to samples taken after fasting for at least 8 hours.   BUN 50 (H) 8 - 23 mg/dL   Creatinine, Ser 1.14 0.61 - 1.24 mg/dL   Calcium 10.0 8.9 - 10.3 mg/dL   Total Protein 7.4 6.5 - 8.1 g/dL   Albumin 4.2 3.5 - 5.0 g/dL   AST 209 (H) 15 - 41 U/L   ALT 117 (H) 0 - 44 U/L   Alkaline Phosphatase 60 38 - 126 U/L   Total Bilirubin 1.3 (H) 0.3 - 1.2 mg/dL   GFR, Estimated >60 >60 mL/min    Comment: (NOTE) Calculated using the CKD-EPI Creatinine Equation (2021)    Anion gap 13 5 - 15    Comment: Performed at [facility] [address] [name], [name] [address]  CK     Status: Abnormal   Collection Time: 10/10/20  4:02 PM  Result Value Ref Range   Total CK 4,232 (H) 49 - 397 U/L    Comment: RESULTS CONFIRMED BY MANUAL DILUTION Performed at [facility], [place] [address] [name], [place] [address]   CBC     Status: Abnormal   Collection Time: 10/10/20  4:02 PM  Result Value Ref Range   WBC  11.8 (H) 4.0 - 10.5 K/uL   RBC 3.97 (L) 4.22 - 5.81 MIL/uL   Hemoglobin 12.6 (L) 13.0 - 17.0 g/dL   HCT 38.3 (L) 39.0 - 52.0 %   MCV 96.5 80.0 - 100.0 fL   MCH 31.7 26.0 - 34.0 pg   MCHC 32.9 30.0 - 36.0 g/dL   RDW 19.2 (H) 11.5 - 15.5 %   Platelets 166 150 - 400 K/uL   nRBC 0.0 0.0 - 0.2 %    Comment: Performed at [facility], [place] [address], [name] [address]  Lipase, blood     Status: None   Collection Time: 10/10/20  4:02 PM  Result Value Ref Range   Lipase 27 11 - 51 U/L    Comment: Performed at [facility] [address], [place], [place] [address]  Magnesium     Status: None   Collection Time: 10/10/20  4:02 PM  Result Value Ref Range   Magnesium 2.4 1.7 - 2.4 mg/dL    Comment: Performed at [facility], [name] [address], [name] [address]  Protime-INR - (order if patient is taking Coumadin / Warfarin)     Status: None   Collection Time: 10/10/20  4:02 PM  Result Value Ref Range   Prothrombin Time 14.6 11.4 - 15.2 seconds   INR 1.2 0.8 - 1.2    Comment: (NOTE) INR goal varies based on device and disease states. Performed at [facility], [place] [address] [place], [name] [address]   Urinalysis, Routine w reflex microscopic Urine, Clean Catch     Status: Abnormal   Collection Time: 10/10/20  4:10 PM  Result Value Ref Range   Color, Urine AMBER (A) YELLOW    Comment: BIOCHEMICALS MAY BE AFFECTED BY COLOR   APPearance HAZY (A) CLEAR   Specific Gravity, Urine 1.029 1.005 - 1.030   pH 5.0 5.0 - 8.0   Glucose, UA NEGATIVE NEGATIVE mg/dL   Hgb urine dipstick LARGE (A) NEGATIVE   Bilirubin Urine NEGATIVE NEGATIVE   Ketones, ur 20 (A) NEGATIVE mg/dL   Protein, ur 100 (A) NEGATIVE mg/dL   Nitrite NEGATIVE NEGATIVE   Leukocytes,Ua NEGATIVE NEGATIVE  RBC / HPF 6-10 0 - 5 RBC/hpf   WBC, UA 0-5 0 - 5 WBC/hpf   Bacteria, UA RARE (A) NONE SEEN   Squamous Epithelial / LPF 0-5 0 - 5   Mucus PRESENT     Comment: Performed at [facility], [place] [address] [name], [place] [address]  CBG monitoring, ED     Status: Abnormal   Collection Time: 10/10/20  4:50 PM  Result Value Ref Range   Glucose-Capillary 117 (H) 70 - 99 mg/dL    Comment: Glucose reference range applies only to samples taken after fasting for at least 8 hours.  Lactic acid, plasma     Status: Abnormal   Collection Time: 10/10/20  6:25 PM  Result Value Ref Range   Lactic Acid, Venous 2.8 (HH) 0.5 - 1.9 mmol/L    Comment: CRITICAL VALUE NOTED.  VALUE IS CONSISTENT WITH PREVIOUSLY REPORTED AND CALLED VALUE. Performed at [facility], [place] [address] [place], [place] [address]   Resp Panel by RT-PCR (Flu A&B, Covid) Nasopharyngeal Swab     Status: None   Collection Time: 10/10/20  6:25 PM   Specimen: Nasopharyngeal Swab; Nasopharyngeal(NP) swabs in vial transport medium  Result Value Ref Range   SARS Coronavirus 2 by RT PCR NEGATIVE NEGATIVE    Comment: (NOTE) SARS-CoV-2 target nucleic acids are NOT DETECTED.  The SARS-CoV-2 RNA is generally detectable in upper respiratory specimens during the acute phase of infection. The lowest concentration of SARS-CoV-2 viral copies this assay can detect is 138 copies/mL. A negative result does not preclude SARS-Cov-2 infection and should not be used as the sole basis for treatment or other patient management decisions. A negative result may occur with  improper specimen collection/handling, submission of specimen other than nasopharyngeal swab, presence of viral mutation(s) within the areas targeted by this assay, and inadequate number of viral copies(<138 copies/mL). A negative result must be combined with clinical observations, patient history, and epidemiological information. The expected result is Negative.  Fact Sheet for Patients:  EntrepreneurPulse.com.au  Fact Sheet for Healthcare Providers:  IncredibleEmployment.be  This test is no t yet approved or cleared  by the [place] FDA and  has been authorized for detection and/or diagnosis of SARS-CoV-2 by FDA under an Emergency Use Authorization (EUA). This EUA will remain  in effect (meaning this test can be used) for the duration of the COVID-19 declaration under Section 564(b)(1) of the Act, 21 U.S.C.section 360bbb-3(b)(1), unless the authorization is terminated  or revoked sooner.       Influenza A by PCR NEGATIVE NEGATIVE   Influenza B by PCR NEGATIVE NEGATIVE    Comment: (NOTE) The Xpert Xpress SARS-CoV-2/FLU/RSV plus assay is intended as an aid in the diagnosis of influenza from Nasopharyngeal swab specimens and should not be used as a sole basis for treatment. Nasal washings and aspirates are unacceptable for Xpert Xpress SARS-CoV-2/FLU/RSV testing.  Fact Sheet for Patients: EntrepreneurPulse.com.au  Fact Sheet for Healthcare Providers: IncredibleEmployment.be  This test is not yet approved or cleared by the [place] FDA and has been authorized for detection and/or diagnosis of SARS-CoV-2 by FDA under an Emergency Use Authorization (EUA). This EUA will remain in effect (meaning this test can be used) for the duration of the COVID-19 declaration under Section 564(b)(1) of the Act, 21 U.S.C. section 360bbb-3(b)(1), unless the authorization is terminated or revoked.  Performed at [facility], [place] [address], [place], [place] [address]   Comprehensive metabolic panel  Status: Abnormal   Collection Time: 10/11/20  2:47 AM  Result Value Ref Range   Sodium 143 135 - 145 mmol/L   Potassium 3.5 3.5 - 5.1 mmol/L   Chloride 116 (H) 98 - 111 mmol/L   CO2 19 (L) 22 - 32 mmol/L   Glucose, Bld 93 70 - 99 mg/dL    Comment: Glucose reference range applies only to samples taken after fasting for at least 8 hours.   BUN 41 (H) 8 - 23 mg/dL   Creatinine, Ser 1.03 0.61 - 1.24 mg/dL   Calcium 8.7 (L) 8.9 - 10.3 mg/dL   Total Protein 6.2  (L) 6.5 - 8.1 g/dL   Albumin 3.4 (L) 3.5 - 5.0 g/dL   AST 127 (H) 15 - 41 U/L   ALT 91 (H) 0 - 44 U/L   Alkaline Phosphatase 52 38 - 126 U/L   Total Bilirubin 1.3 (H) 0.3 - 1.2 mg/dL   GFR, Estimated >60 >60 mL/min    Comment: (NOTE) Calculated using the CKD-EPI Creatinine Equation (2021)    Anion gap 8 5 - 15    Comment: Performed at [facility] [address], [place], [place] [address]  CBC     Status: Abnormal   Collection Time: 10/11/20  2:47 AM  Result Value Ref Range   WBC 10.0 4.0 - 10.5 K/uL   RBC 3.39 (L) 4.22 - 5.81 MIL/uL   Hemoglobin 11.0 (L) 13.0 - 17.0 g/dL   HCT 33.3 (L) 39.0 - 52.0 %   MCV 98.2 80.0 - 100.0 fL   MCH 32.4 26.0 - 34.0 pg   MCHC 33.0 30.0 - 36.0 g/dL   RDW 19.3 (H) 11.5 - 15.5 %   Platelets 136 (L) 150 - 400 K/uL   nRBC 0.0 0.0 - 0.2 %    Comment: Performed at [facility], [place] [address] [place], [place] [address]    CT Angio Head W or [place] Contrast  Result Date: 10/10/2020 CLINICAL DATA:  Fall. History of extensive stage small cell lung cancer. EXAM: CT ANGIOGRAPHY HEAD AND NECK TECHNIQUE: Multidetector CT imaging of the head and neck was performed using the standard protocol during bolus administration of intravenous contrast. Multiplanar CT image reconstructions and MIPs were obtained to evaluate the vascular anatomy. Carotid stenosis measurements (when applicable) are obtained utilizing NASCET criteria, using the distal internal carotid diameter as the denominator. CONTRAST:  60mL OMNIPAQUE IOHEXOL 350 MG/ML SOLN COMPARISON:  Head, cervical spine, and maxillofacial CTs earlier today. Neck CT 09/03/2020. No prior angiographic imaging. FINDINGS: CTA NECK FINDINGS Aortic arch: Standard 3 vessel aortic arch with mild atherosclerotic plaque. No significant arch vessel origin stenosis. Right carotid system: Patent with soft and calcified plaque in the carotid bulb. 65% stenosis of the ICA at the distal aspect of the bulb. Left carotid system:  Patent with predominantly calcified plaque in the carotid bulb. No evidence of significant stenosis or dissection. Vertebral arteries: Patent without evidence of dissection or a significant stenosis on the left. Calcified plaque at the right vertebral artery origin results in moderate stenosis. Skeleton: Mild compression fractures in the upper thoracic spine, also present on a chest CT last month. Severe facet arthrosis on the left at C2-3 and on the right at C3-4 with trace anterolisthesis at the latter. Fracture involving the anterior wall of the left frontal sinus as described on earlier maxillofacial CT. Other neck: 1.3 cm short axis superficial lymph node along the right lower neck and 1.2  cm short axis left level V lymph node as noted on today's earlier cervical spine CT, both larger than on the 09/03/2020 neck CT. 1.9 x 1.3 cm [place]-density focus in the left nasopharyngeal region, unchanged from the prior neck CT and potentially reflecting a necrotic retropharyngeal lymph node. Unchanged 0.8 cm right parotid nodule. Upper chest: Severe emphysema. Review of the MIP images confirms the above findings CTA HEAD FINDINGS Anterior circulation: The internal carotid arteries are patent from skull base to carotid termini with minimal nonstenotic plaque. ACAs and MCAs are patent without evidence of a proximal branch occlusion or significant proximal stenosis. The right A1 segment is mildly hypoplastic. No aneurysm is identified. Posterior circulation: The intracranial vertebral arteries are patent to the basilar with mild irregularity but no flow limiting stenosis. 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 There is a small right posterior communicating artery. Both PCAs are patent without evidence of a significant proximal stenosis. No aneurysm is identified. Venous sinuses: Patent. Anatomic variants: None of significance. Review of the MIP images confirms the above findings  IMPRESSION: 1. No large vessel occlusion or significant proximal intracranial stenosis. 2. 65% proximal right ICA stenosis. 3. Moderate right vertebral artery origin stenosis. 4. Bilateral lower neck lymphadenopathy, mildly progressed from the 09/03/2020 neck CT. 5. Aortic Atherosclerosis (ICD10-I70.0) and Emphysema (ICD10-J43.9). Electronically Signed   By: Logan Bores M.D.   On: 10/10/2020 19:54   CT Head Wo Contrast  Result Date: 10/10/2020 CLINICAL DATA:  Fall on bathroom, laceration along the head EXAM: CT HEAD WITHOUT CONTRAST TECHNIQUE: Contiguous axial images were obtained from the base of the skull through the vertex without intravenous contrast. COMPARISON:  09/07/2019 FINDINGS: Brain: The brainstem, cerebellum, cerebral peduncles, thalami, basal ganglia, basilar cisterns, and ventricular system appear within normal limits. Periventricular white matter and corona radiata hypodensities favor chronic ischemic microvascular white matter disease. No intracranial hemorrhage, mass lesion, or acute CVA. Vascular: There is atherosclerotic calcification of the cavernous carotid arteries bilaterally. Skull: Left frontal fracture along the anterior margin of the frontal sinus underlying the scalp hematoma as shown on image 59 series 4. Sinuses/Orbits: There is opacification of several left-sided mastoid air cells. Although there is a fracture of the anterior margin of the left frontal sinus, no fluid is identified in the sinus at this time. Left periorbital hematoma without observed intraorbital abnormality. Other: Left forehead scalp hematoma overlying the frontal fracture. IMPRESSION: 1. Fracture of the anterior wall of the left frontal sinus, with overlying scalp hematoma. 2. No intracranial hemorrhage or specific acute intracranial findings. Periventricular white matter and corona radiata hypodensities favor chronic ischemic microvascular white matter disease. 3. Opacification of several left-sided mastoid  air cells. Electronically Signed   By: Van Clines M.D.   On: 10/10/2020 17:06   CT Angio Neck W and/or Wo Contrast  Result Date: 10/10/2020 CLINICAL DATA:  Fall. History of extensive stage small cell lung cancer. EXAM: CT ANGIOGRAPHY HEAD AND NECK TECHNIQUE: Multidetector CT imaging of the head and neck was performed using the standard protocol during bolus administration of intravenous contrast. Multiplanar CT image reconstructions and MIPs were obtained to evaluate the vascular anatomy. Carotid stenosis measurements (when applicable) are obtained utilizing NASCET criteria, using the distal internal carotid diameter as the denominator. CONTRAST:  101mL OMNIPAQUE IOHEXOL 350 MG/ML SOLN COMPARISON:  Head, cervical spine, and maxillofacial CTs earlier today. Neck CT 09/03/2020. No prior angiographic imaging. FINDINGS: CTA NECK FINDINGS Aortic arch: Standard 3 vessel aortic arch with mild  atherosclerotic plaque. No significant arch vessel origin stenosis. Right carotid system: Patent with soft and calcified plaque in the carotid bulb. 65% stenosis of the ICA at the distal aspect of the bulb. Left carotid system: Patent with predominantly calcified plaque in the carotid bulb. No evidence of significant stenosis or dissection. Vertebral arteries: Patent without evidence of dissection or a significant stenosis on the left. Calcified plaque at the right vertebral artery origin results in moderate stenosis. Skeleton: Mild compression fractures in the upper thoracic spine, also present on a chest CT last month. Severe facet arthrosis on the left at C2-3 and on the right at C3-4 with trace anterolisthesis at the latter. Fracture involving the anterior wall of the left frontal sinus as described on earlier maxillofacial CT. Other neck: 1.3 cm short axis superficial lymph node along the right lower neck and 1.2 cm short axis left level V lymph node as noted on today's earlier cervical spine CT, both larger than on  the 09/03/2020 neck CT. 1.9 x 1.3 cm [place]er-density focus in the left nasopharyngeal region, unchanged from the prior neck CT and potentially reflecting a necrotic retropharyngeal lymph node. Unchanged 0.8 cm right parotid nodule. Upper chest: Severe emphysema. Review of the MIP images confirms the above findings CTA HEAD FINDINGS Anterior circulation: The internal carotid arteries are patent from skull base to carotid termini with minimal nonstenotic plaque. ACAs and MCAs are patent without evidence of a proximal branch occlusion or significant proximal stenosis. The right A1 segment is mildly hypoplastic. No aneurysm is identified. Posterior circulation: The intracranial vertebral arteries are patent to the basilar with mild irregularity but no flow limiting stenosis. 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 There is a small right posterior communicating artery. Both PCAs are patent without evidence of a significant proximal stenosis. No aneurysm is identified. Venous sinuses: Patent. Anatomic variants: None of significance. Review of the MIP images confirms the above findings IMPRESSION: 1. No large vessel occlusion or significant proximal intracranial stenosis. 2. 65% proximal right ICA stenosis. 3. Moderate right vertebral artery origin stenosis. 4. Bilateral lower neck lymphadenopathy, mildly progressed from the 09/03/2020 neck CT. 5. Aortic Atherosclerosis (ICD10-I70.0) and Emphysema (ICD10-J43.9). Electronically Signed   By: Logan Bores M.D.   On: 10/10/2020 19:54   CT Chest W Contrast  Result Date: 10/10/2020 CLINICAL DATA:  Fall in the bathroom. Left hip pain. Stage IV small cell lung cancer. EXAM: CT CHEST, ABDOMEN, AND PELVIS WITH CONTRAST TECHNIQUE: Multidetector CT imaging of the chest, abdomen and pelvis was performed following the standard protocol during bolus administration of intravenous contrast. CONTRAST:  80 cc Omnipaque 300 IV COMPARISON:  CT  chest abdomen pelvis 09/03/2020. FINDINGS: CT CHEST FINDINGS Cardiovascular: No evidence of aortic or acute vascular injury. Moderate aortic atherosclerosis. Right chest port with tip in the lower SVC. Normal heart size with coronary artery calcifications. No pericardial fluid. Mediastinum/Nodes: No mediastinal hemorrhage or hematoma. No enlarged mediastinal or hilar lymph nodes. No pneumomediastinum. Decompressed esophagus. No thyroid nodule. Lungs/Pleura: No pneumothorax or pulmonary contusion. Moderately advanced emphysema. New from prior exam is frothy debris layering in the trachea and tracking into the left greater than right mainstem bronchi. Dependent opacity at the left lung base favored to be atelectasis. Multiple pulmonary nodules are again seen. Accurate measurement of these nodules is limited on the current exam due to motion. Dominant nodules are in the right lower lobe on series 5, image 117 and 123, and in the left lower lobe  image 119. These are grossly stable, but again not well measured. No pleural fluid. Musculoskeletal: Breathing motion limits detailed rib assessment. No displaced rib fracture. No evidence of acute fracture of the sternum, included clavicles, or shoulder girdles. No fracture of the thoracic spine. No evidence of focal bone lesion. No confluent chest wall abnormality. CT ABDOMEN PELVIS FINDINGS Hepatobiliary: No hepatic injury or perihepatic hematoma. Tiny hypodense lesion within segment 4 of the liver is too small to characterize but unchanged. Unremarkable gallbladder. Pancreas: No evidence of injury. No ductal dilatation or inflammation. Spleen: No splenic injury or perisplenic hematoma. Splenule anteriorly. Adrenals/Urinary Tract: No adrenal nodule. No adrenal hemorrhage. No evidence of renal injury. No hydronephrosis. There are bilateral renal cysts as well as Greer-density lesions too small to accurately characterize, grossly stable from CT last month. Punctate calcification  in the anterior dome of the bladder is unchanged. No bladder wall thickening or injury Stomach/Bowel: No evidence of bowel injury or acute abnormality. Lack of enteric contrast as well as patient motion limits detailed assessment. Decompressed stomach. No bowel inflammation. There is no free air. Normal appendix. Sigmoid diverticulosis without diverticulitis. Vascular/Lymphatic: No vascular injury. No acute vascular findings. Moderate aortic atherosclerosis without acute aortic abnormality. IVC is intact. No retroperitoneal fluid. No abdominopelvic adenopathy. Reproductive: Prostate is unremarkable. Other: No ascites or free fluid. No free air. Confluent body wall contusion. Musculoskeletal: Displaced, angulated mildly comminuted left femoral neck fracture. No obvious underlying bone lesion on CT. The remainder of the bony pelvis is intact. Nondisplaced left L3 transverse process fracture. IMPRESSION: 1. Displaced, angulated, mildly comminuted left femoral neck fracture. 2. Nondisplaced left L3 transverse process fracture. 3. No additional acute traumatic injury to the chest, abdomen, or pelvis. 4. New frothy debris layering in the trachea and tracking into the left greater than right mainstem bronchi, retained secretions or aspiration. 5. Multiple pulmonary nodules are grossly stable from CT last month. Please note the detailed nodule evaluation is limited on the current exam due to motion artifact. 6. Chronic findings of colonic diverticulosis without diverticulitis. Moderate emphysema. Aortic Atherosclerosis (ICD10-I70.0) and Emphysema (ICD10-J43.9). Electronically Signed   By: Keith Rake M.D.   On: 10/10/2020 17:14   CT Cervical Spine Wo Contrast  Result Date: 10/10/2020 CLINICAL DATA:  Fall, head injury. EXAM: CT CERVICAL SPINE WITHOUT CONTRAST TECHNIQUE: Multidetector CT imaging of the cervical spine was performed without intravenous contrast. Multiplanar CT image reconstructions were also generated.  COMPARISON:  CT neck dated 09/03/2020 FINDINGS: Alignment: No vertebral subluxation is observed. Skull base and vertebrae: No cervical spine fracture or acute bony findings. Soft tissues and spinal canal: Atherosclerotic calcification of the carotid arteries and branch vessels. Superficial lymph node along the right lower neck measures 1.3 cm in short axis on image 69 of series 5, previously 1.0 cm. Hypodensity along the left posterior nasopharyngeal margin measuring 2.1 by 1.8 cm, similar to the prior exam. Left level V lymph node measures 1.2 cm in short axis on image 64 series 5, previously 0.9 cm. Disc levels: Mild left foraminal stenosis at C2-3, and right foraminal stenosis at C3-4 due to facet spurring. Upper chest: Severe emphysema in the lung apices. Biapical pleuroparenchymal scarring. Mucus in the trachea. Other: Right central line partially observed. IMPRESSION: 1. No acute cervical spine findings. 2. Enlargement of 2 lymph nodes in the lower neck compared to the 09/03/2020 CT of the neck. This may be a manifestation of the patient's known malignancy. 3. Foraminal narrowing at C2-3 and C3-4 due to spurring. 4.  Severe emphysema. 5. Carotid atherosclerosis. 6. Stable hypodensity along the left posterior nasopharynx, potentially a mucous retention cyst or necrotic lymph node. Electronically Signed   By: Van Clines M.D.   On: 10/10/2020 17:13   CT ABDOMEN PELVIS W CONTRAST  Result Date: 10/10/2020 CLINICAL DATA:  Fall in the bathroom. Left hip pain. Stage IV small cell lung cancer. EXAM: CT CHEST, ABDOMEN, AND PELVIS WITH CONTRAST TECHNIQUE: Multidetector CT imaging of the chest, abdomen and pelvis was performed following the standard protocol during bolus administration of intravenous contrast. CONTRAST:  80 cc Omnipaque 300 IV COMPARISON:  CT chest abdomen pelvis 09/03/2020. FINDINGS: CT CHEST FINDINGS Cardiovascular: No evidence of aortic or acute vascular injury. Moderate aortic  atherosclerosis. Right chest port with tip in the lower SVC. Normal heart size with coronary artery calcifications. No pericardial fluid. Mediastinum/Nodes: No mediastinal hemorrhage or hematoma. No enlarged mediastinal or hilar lymph nodes. No pneumomediastinum. Decompressed esophagus. No thyroid nodule. Lungs/Pleura: No pneumothorax or pulmonary contusion. Moderately advanced emphysema. New from prior exam is frothy debris layering in the trachea and tracking into the left greater than right mainstem bronchi. Dependent opacity at the left lung base favored to be atelectasis. Multiple pulmonary nodules are again seen. Accurate measurement of these nodules is limited on the current exam due to motion. Dominant nodules are in the right lower lobe on series 5, image 117 and 123, and in the left lower lobe image 119. These are grossly stable, but again not well measured. No pleural fluid. Musculoskeletal: Breathing motion limits detailed rib assessment. No displaced rib fracture. No evidence of acute fracture of the sternum, included clavicles, or shoulder girdles. No fracture of the thoracic spine. No evidence of focal bone lesion. No confluent chest wall abnormality. CT ABDOMEN PELVIS FINDINGS Hepatobiliary: No hepatic injury or perihepatic hematoma. Tiny hypodense lesion within segment 4 of the liver is too small to characterize but unchanged. Unremarkable gallbladder. Pancreas: No evidence of injury. No ductal dilatation or inflammation. Spleen: No splenic injury or perisplenic hematoma. Splenule anteriorly. Adrenals/Urinary Tract: No adrenal nodule. No adrenal hemorrhage. No evidence of renal injury. No hydronephrosis. There are bilateral renal cysts as well as Greer-density lesions too small to accurately characterize, grossly stable from CT last month. Punctate calcification in the anterior dome of the bladder is unchanged. No bladder wall thickening or injury Stomach/Bowel: No evidence of bowel injury or acute  abnormality. Lack of enteric contrast as well as patient motion limits detailed assessment. Decompressed stomach. No bowel inflammation. There is no free air. Normal appendix. Sigmoid diverticulosis without diverticulitis. Vascular/Lymphatic: No vascular injury. No acute vascular findings. Moderate aortic atherosclerosis without acute aortic abnormality. IVC is intact. No retroperitoneal fluid. No abdominopelvic adenopathy. Reproductive: Prostate is unremarkable. Other: No ascites or free fluid. No free air. Confluent body wall contusion. Musculoskeletal: Displaced, angulated mildly comminuted left femoral neck fracture. No obvious underlying bone lesion on CT. The remainder of the bony pelvis is intact. Nondisplaced left L3 transverse process fracture. IMPRESSION: 1. Displaced, angulated, mildly comminuted left femoral neck fracture. 2. Nondisplaced left L3 transverse process fracture. 3. No additional acute traumatic injury to the chest, abdomen, or pelvis. 4. New frothy debris layering in the trachea and tracking into the left greater than right mainstem bronchi, retained secretions or aspiration. 5. Multiple pulmonary nodules are grossly stable from CT last month. Please note the detailed nodule evaluation is limited on the current exam due to motion artifact. 6. Chronic findings of colonic diverticulosis without diverticulitis. Moderate emphysema. Aortic Atherosclerosis (  ICD10-I70.0) and Emphysema (ICD10-J43.9). Electronically Signed   By: Keith Rake M.D.   On: 10/10/2020 17:14   CT Maxillofacial Wo Contrast  Result Date: 10/10/2020 CLINICAL DATA:  Facial trauma EXAM: CT MAXILLOFACIAL WITHOUT CONTRAST TECHNIQUE: Multidetector CT imaging of the maxillofacial structures was performed. Multiplanar CT image reconstructions were also generated. COMPARISON:  CT head 10/10/2020 FINDINGS: Osseous: Acute fracture of the anterior wall of the left frontal sinus with overlying scalp hematoma as on image 75 series  5. No significant fluid is currently present in the sinus. No other facial fractures are identified. Orbits: Left periorbital hematoma but without intraorbital abnormality observed. Sinuses: As noted above there is a fracture along the anterior wall of the left frontal sinus but without a fluid level in the sinus. Opacification of several mastoid air cells noted compatible with a small left mastoid effusion. Soft tissues: Left forehead scalp hematoma extending into the periorbital region. Limited intracranial: Please see CT head report. IMPRESSION: 1. Acute fracture the anterior wall of the left frontal sinus with overlying scalp hematoma. 2. Small left mastoid effusion. Electronically Signed   By: Van Clines M.D.   On: 10/10/2020 17:15    Review of Systems  Unable to perform ROS: Mental status change   Blood pressure (!) 160/72, pulse 81, temperature 98.9 F (37.2 C), temperature source Oral, resp. rate 18, height 5\' 9"  (1.753 m), weight 66.7 kg, SpO2 91 %. Physical Exam Constitutional:      General: He is not in acute distress.    Appearance: He is well-developed. He is not diaphoretic.  HENT:     Head: Normocephalic and atraumatic.  Eyes:     General: No scleral icterus.       Right eye: No discharge.        Left eye: No discharge.     Conjunctiva/sclera: Conjunctivae normal.  Cardiovascular:     Rate and Rhythm: Normal rate and regular rhythm.  Pulmonary:     Effort: Pulmonary effort is normal. No respiratory distress.  Musculoskeletal:     Cervical back: Normal range of motion.     Comments: LLE No traumatic wounds, ecchymosis, or rash  Severe TTP hip  No knee or ankle effusion  Knee stable to varus/ valgus and anterior/posterior stress  Sens DPN, SPN, TN intact  Motor EHL, ext, flex, evers 5/5  DP 2+, PT 2+, No significant edema  Skin:    General: Skin is warm and dry.  Neurological:     Mental Status: He is alert.  Psychiatric:        Behavior: Behavior normal.      Assessment/Plan: Left hip fx -- Will plan on hip hemi this afternoon with Dr. Marcelino Scot if cleared by medicine. Please keep NPO. Other injuries including facial and lumbar fxs -- Non-operative Multiple medical problems including tobacco use, CAD/ MI/ sp stent placement, COPD , and cancer getting chemoRx -- per primary service    Lisette Abu, PA-C Orthopedic Surgery [phone] 10/11/2020, 9:05 AM

## 2020-10-11 NOTE — Progress Notes (Signed)
Pharmacy Antibiotic Note  [name] is a 70 y.o. male admitted on 10/10/2020 with sepsis.  Pharmacy has been consulted for vancomycin and cefepime dosing.  Patient has worsening hypoxia and respiratory distress on 3/31 requiring transfer into the ICU and intubation.  SCr down 1.03, Tmax 100.2, WBC normalized, LA 2.7.  Plan: Continue vanc 1250mg  IV Q24H for AUC 428 using SCr 1.03-1.14 Increase cefepime to 2gm IV Q8H Monitor renal fxn, clinical progress, vanc levels as indicated   Height: 5\' 9"  (175.3 cm) Weight: 66.7 kg (147 lb) IBW/kg (Calculated) : 70.7  Temp (24hrs), Avg:98.8 F (37.1 C), Min:97.4 F (36.3 C), Max:100.2 F (37.9 C)  Recent Labs  Lab 10/10/20 1600 10/10/20 1602 10/10/20 1825 10/11/20 0247 10/11/20 1141  WBC  --  11.8*  --  10.0  --   CREATININE  --  1.14  --  1.03  --   LATICACIDVEN 2.6*  --  2.8*  --  2.7*    Estimated Creatinine Clearance: 63.9 mL/min (by C-G formula based on SCr of 1.03 mg/dL).    Allergies  Allergen Reactions  . Codeine Nausea Only  . Niaspan [Niacin Er]     Vanc 3/30 >> Cefepime 3/30 >>  3/30 BCx -  3/30 UCx -   Charis Juliana D. Mina Marble, PharmD, BCPS, [place] 10/11/2020, 1:27 PM

## 2020-10-11 NOTE — Anesthesia Preprocedure Evaluation (Deleted)
Anesthesia Evaluation  Patient identified by MRN, date of birth, ID band Patient awake    Reviewed: Allergy & Precautions, H&P , NPO status , Patient's Chart, lab work & pertinent test results  Airway Mallampati: II  TM Distance: >3 FB Neck ROM: full    Dental no notable dental hx.    Pulmonary COPD, Current Smoker,    Pulmonary exam normal breath sounds clear to auscultation       Cardiovascular Exercise Tolerance: Good + CAD and + Past MI   Rhythm:regular Rate:Normal     Neuro/Psych PSYCHIATRIC DISORDERS Anxiety Depression negative neurological ROS     GI/Hepatic negative GI ROS, Neg liver ROS,   Endo/Other  negative endocrine ROS  Renal/GU negative Renal ROS  negative genitourinary   Musculoskeletal  (+) Arthritis , Osteoarthritis,    Abdominal   Peds  Hematology  (+) Blood dyscrasia, anemia ,   Anesthesia Other Findings   Reproductive/Obstetrics negative OB ROS                           Anesthesia Physical Anesthesia Plan  ASA: IV  Anesthesia Plan: MAC and Spinal   Post-op Pain Management:    Induction: Intravenous  PONV Risk Score and Plan: 2 and Ondansetron  Airway Management Planned: LMA and Oral ETT  Additional Equipment: None  Intra-op Plan:   Post-operative Plan: Extubation in OR  Informed Consent: I have reviewed the patients History and Physical, chart, labs and discussed the procedure including the risks, benefits and alternatives for the proposed anesthesia with the patient or authorized representative who has indicated his/her understanding and acceptance.     Dental Advisory Given  Plan Discussed with: CRNA and Anesthesiologist  Anesthesia Plan Comments:         Anesthesia Quick Evaluation

## 2020-10-11 NOTE — Progress Notes (Signed)
Attempted to call pt's brother to get consent.  No answer at this time.  Left message for him to call unit.

## 2020-10-11 NOTE — Significant Event (Signed)
Rapid Response Event Note   Reason for Call :  O2 desaturation.  Initial Focused Assessment:  Patient on 100% NRB O2 sats 88%  RR 30, mild to moderate increased WOB.  He is confused and restless. Lung sounds: Right - Rhonchi,  Left - Diminished  BP 162/67  HR 120  RR 28  O2 sat 88% on NRB  He became increasingly restless and confused.  Increased Work of breathing and sever respiratory distress.   O2 sats 70% on NRB with max flow BP 184/77  HR 124  RR 32   Dr Loanne Drilling and Richardson Landry NP at bedside to assess patient. Transferred urgently to ICU for intubation.   Interventions:  PCXR max O2 flow with NRB Tx to 3M13, urgently intubated  Plan of Care:     Event Summary:   MD Notified: Per staff Dr Erlinda Hong at bedside prior to my arrival Call Time: [place] Time: 1123 End Time: [place]  Raliegh Ip, RN

## 2020-10-11 NOTE — Progress Notes (Signed)
[facility]  Fever, unclear source Culture> blood, resp Urinalysis Hold antibiotics  Roselie Awkward, MD [facility] Pager: [phone] Cell: [phone] If no response, please call [phone] until 7pm After 7:00 pm call Elink  [phone]

## 2020-10-11 NOTE — Progress Notes (Addendum)
PROGRESS NOTE    EDI GORNIAK  KYH:[id number] DOB: 03-12-1951 DOA: 10/10/2020 PCP: [name], MD    Chief Complaint  Patient presents with  . Fall  . Hip Pain  . Altered Mental Status    Brief Narrative:  Chief Complaint: AMS, fall w/ facial fractures HPI: The patient is a 70 y.o. year-old w/ hx of tobacco use, CAD/ MI/ sp stent placement, COPD , cancer getting chemoRx who was found down at home by his brother after he couldn't get in touch w/ him x 24 hrs.  Found to be confused and had laceration / bruising around the L eye and L forehead and was complaining of L hip pain.  In the ED BP was 170/67, HR 70-90, RR 20, temp 97.4, SpO2 89-95% on RA / 2L [place].  Labs showed K 3.4 , BN 50, Creat 1.14, Ca 10.0, alb 4.2.  AST 209  ALT 117  CPK 4232  Tbili 1.3.  Lactic acid 2.6, repeat 2.8.  WBC 11.8  Hb 12.6  Flu/ COVID negative. UA 6-10 rbc, 0-5 wbc, rare bact, neg LE/ nitrite.  Multiple CT scans were done , see results below  Essentially pt has fractured the ant wall of L frontal sinus w/ scalp hematoma; fracture of the left L3 transverse process and displaced angulated mildly comminuted L femoral neck fracture.  CT head showed no acute findings such as CVA or bleed,  Subjective:  He is very drowsy and confused, he does not follow command consistently, he developed acute hypoxia this morning, O2 sat 82% on 6 L, she is put on nonrebreather O2 sat at 88-89%  There is no fever, no tachycardia ,his blood pressure is elevated  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 [facility])  Acute hypoxic respiratory failure -Unclear etiology, does has history of COPD on chart, not on home O2 -covid19 screening negative -No wheezing on exam, lung sounds very diminished, does has new frothy debris's layering in the trachea and tracking into the left greater than right mainstem bronchi, concerning for secretions or aspiration, has moderate emphysema on CT scan, has multiple  pulmonary nodules that is grossly stable from prior CT scan -When asked whether he has chest pain, he said yes, but he appears to say yes to most of the questions, not sure if this is reliable -We will get stat ABG , chest x-ray, EKG, echocardiogram, CTA, troponin, lactic acid, D-dimer -Continue Vanco and cefepime that started on admission, I do not see any culture obtained in the ED, will add on blood culture, urine culture, I do not hear any cough -He appears dehydrated on exam, continue hydration -Critical care consulted  Acute metabolic encephalopathy -Patient is confused not able to provide history, I placed a call to his brother not able to reach him, I reached out to his oncologist who state patient baseline is very functioning, lives by himself still works -CT head on presentation showed fracture of anterior wall of the left frontal sinus with overall laying scalp hematoma, opacification of several left-sided mastoid air cells ,no intracranial hemorrhage no acute infarct -He cannot tolerate MRI of the brain due to confusion  Rhabdomyolysis -Continue hydration  Abnormal LFTs, likely due to rhabdomyolysis, monitor  Left hip fracture Ortho consulted  Left L3 transverse process fracture/Left frontal sinus fracture, neurosurgery consulted  History of small cell lung cancer, recent chemo early March, per Dr. Delton Coombes, patient is tolerating chemo very well, cancer has  been stable  Nutritional Assessment: The patient's BMI is: Body mass index is 21.71 kg/m.Marland Kitchen Seen by dietician.  I agree with the assessment and plan as outlined below:  Nutrition Status:        .     Skin Assessment:  I have examined the patient's skin and I agree with the wound assessment as performed by the wound care RN as outlined below:  Pressure Injury 10/11/20 Sacrum Medial Stage 1 -  Intact skin with non-blanchable redness of a localized area usually over a bony prominence. (Active)  10/11/20 0220   Location: Sacrum  Location Orientation: Medial  Staging: Stage 1 -  Intact skin with non-blanchable redness of a localized area usually over a bony prominence.  Wound Description (Comments):   Present on Admission: Yes    Unresulted Labs (From admission, onward)          Start     Ordered   10/11/20 1121  D-dimer, quantitative  ONCE - STAT,   STAT        10/11/20 1120   10/11/20 1120  Lactic acid, plasma  STAT Now then every 3 hours,   R (with STAT occurrences)     Question:  Specimen collection method  Answer:  Lab=Lab collect   10/11/20 1119   10/11/20 1115  Blood gas, arterial  ONCE - STAT,   STAT       Question:  Room air or oxygen  Answer:  Oxygen   10/11/20 1115   10/11/20 0920  Culture, blood (routine x 2)  BLOOD CULTURE X 2,   R (with TIMED occurrences)      10/11/20 0919   10/11/20 0919  Culture, Urine  Once,   R        10/11/20 0919            DVT prophylaxis: SCDs Start: 10/10/20 2131   Code Status: Full Family Communication: Please call to brother, not able to reach him Disposition:   Status is: Inpatient   Dispo: The patient is from: Home              Anticipated d/c is to: To be determined              Anticipated d/c date is: TBD                Consultants:   Orthopedic surgeon  Neurosurgery  Critical care  Procedures:   None  Antimicrobials:   Anti-infectives (From admission, onward)   Start     Dose/Rate Route Frequency Ordered Stop   10/11/20 2100  vancomycin (VANCOREADY) IVPB 1250 mg/250 mL        1,250 mg 166.7 mL/hr over 90 Minutes Intravenous Every 24 hours 10/10/20 2104     10/11/20 1200  ceFAZolin (ANCEF) IVPB 2g/100 mL premix        2 g 200 mL/hr over 30 Minutes Intravenous On call to O.R. 10/11/20 1114 10/12/20 0559   10/10/20 2100  vancomycin (VANCOREADY) IVPB 1250 mg/250 mL        1,250 mg 166.7 mL/hr over 90 Minutes Intravenous  Once 10/10/20 2058 10/10/20 2327   10/10/20 2100  ceFEPIme (MAXIPIME) 2 g in sodium  chloride 0.9 % 100 mL IVPB        2 g 200 mL/hr over 30 Minutes Intravenous Every 12 hours 10/10/20 2058            Objective: Vitals:   10/11/20 0145 10/11/20 0220 10/11/20 0641 10/11/20 1125  BP: (!) 159/63 (!) 171/71 (!) 160/72 (!) 162/67  Pulse: 84 81 81 87  Resp: 20 16 18    Temp:  98.9 F (37.2 C) 98.9 F (37.2 C) 98.6 F (37 C)  TempSrc:  Axillary Oral Oral  SpO2: 91% 96% 91%   Weight:      Height:        Intake/Output Summary (Last 24 hours) at 10/11/2020 1155 Last data filed at 10/11/2020 1761 Gross per 24 hour  Intake 1000 ml  Output 200 ml  Net 800 ml   Filed Weights   10/10/20 1538  Weight: 66.7 kg    Examination:  General exam: Drowsy, does not follow command consistently, left scalp hematoma, appear dehydrated Respiratory system: Clear to auscultation. Respiratory effort normal. Cardiovascular system: S1 & S2 heard, RRR.  No pedal edema. Gastrointestinal system: Abdomen is nondistended, soft and nontender.  Normal bowel sounds heard. Central nervous system: Drowsy, does not follow command consistently Extremities: Left hip tender to palpation Skin: Left scalp hematoma Psychiatry: drowsy, confused    Data Reviewed: I have personally reviewed following labs and imaging studies  CBC: Recent Labs  Lab 10/10/20 1602 10/11/20 0247  WBC 11.8* 10.0  HGB 12.6* 11.0*  HCT 38.3* 33.3*  MCV 96.5 98.2  PLT 166 136*    Basic Metabolic Panel: Recent Labs  Lab 10/10/20 1602 10/11/20 0247  NA 143 143  K 3.4* 3.5  CL 106 116*  CO2 24 19*  GLUCOSE 120* 93  BUN 50* 41*  CREATININE 1.14 1.03  CALCIUM 10.0 8.7*  MG 2.4  --     GFR: Estimated Creatinine Clearance: 63.9 mL/min (by C-G formula based on SCr of 1.03 mg/dL).  Liver Function Tests: Recent Labs  Lab 10/10/20 1602 10/11/20 0247  AST 209* 127*  ALT 117* 91*  ALKPHOS 60 52  BILITOT 1.3* 1.3*  PROT 7.4 6.2*  ALBUMIN 4.2 3.4*    CBG: Recent Labs  Lab 10/10/20 1650  GLUCAP  117*     Recent Results (from the past 240 hour(s))  Resp Panel by RT-PCR (Flu A&B, Covid) Nasopharyngeal Swab     Status: None   Collection Time: 10/10/20  6:25 PM   Specimen: Nasopharyngeal Swab; Nasopharyngeal(NP) swabs in vial transport medium  Result Value Ref Range Status   SARS Coronavirus 2 by RT PCR NEGATIVE NEGATIVE Final    Comment: (NOTE) SARS-CoV-2 target nucleic acids are NOT DETECTED.  The SARS-CoV-2 RNA is generally detectable in upper respiratory specimens during the acute phase of infection. The lowest concentration of SARS-CoV-2 viral copies this assay can detect is 138 copies/mL. A negative result does not preclude SARS-Cov-2 infection and should not be used as the sole basis for treatment or other patient management decisions. A negative result may occur with  improper specimen collection/handling, submission of specimen other than nasopharyngeal swab, presence of viral mutation(s) within the areas targeted by this assay, and inadequate number of viral copies(<138 copies/mL). A negative result must be combined with clinical observations, patient history, and epidemiological information. The expected result is Negative.  Fact Sheet for Patients:  EntrepreneurPulse.com.au  Fact Sheet for Healthcare Providers:  IncredibleEmployment.be  This test is no t yet approved or cleared by the [place] FDA and  has been authorized for detection and/or diagnosis of SARS-CoV-2 by FDA under an Emergency Use Authorization (EUA). This EUA will remain  in effect (meaning this test can be used) for the duration of the COVID-19 declaration under Section 564(b)(1) of the Act,  21 U.S.C.section 360bbb-3(b)(1), unless the authorization is terminated  or revoked sooner.       Influenza A by PCR NEGATIVE NEGATIVE Final   Influenza B by PCR NEGATIVE NEGATIVE Final    Comment: (NOTE) The Xpert Xpress SARS-CoV-2/FLU/RSV plus assay is  intended as an aid in the diagnosis of influenza from Nasopharyngeal swab specimens and should not be used as a sole basis for treatment. Nasal washings and aspirates are unacceptable for Xpert Xpress SARS-CoV-2/FLU/RSV testing.  Fact Sheet for Patients: EntrepreneurPulse.com.au  Fact Sheet for Healthcare Providers: IncredibleEmployment.be  This test is not yet approved or cleared by the [place] FDA and has been authorized for detection and/or diagnosis of SARS-CoV-2 by FDA under an Emergency Use Authorization (EUA). This EUA will remain in effect (meaning this test can be used) for the duration of the COVID-19 declaration under Section 564(b)(1) of the Act, 21 U.S.C. section 360bbb-3(b)(1), unless the authorization is terminated or revoked.  Performed at [facility], [place] [address] [place], [place] [address]          Radiology Studies: CT Angio Head W or Wo Contrast  Result Date: 10/10/2020 CLINICAL DATA:  Fall. History of extensive stage small cell lung cancer. EXAM: CT ANGIOGRAPHY HEAD AND NECK TECHNIQUE: Multidetector CT imaging of the head and neck was performed using the standard protocol during bolus administration of intravenous contrast. Multiplanar CT image reconstructions and MIPs were obtained to evaluate the vascular anatomy. Carotid stenosis measurements (when applicable) are obtained utilizing NASCET criteria, using the distal internal carotid diameter as the denominator. CONTRAST:  29mL OMNIPAQUE IOHEXOL 350 MG/ML SOLN COMPARISON:  Head, cervical spine, and maxillofacial CTs earlier today. Neck CT 09/03/2020. No prior angiographic imaging. FINDINGS: CTA NECK FINDINGS Aortic arch: Standard 3 vessel aortic arch with mild atherosclerotic plaque. No significant arch vessel origin stenosis. Right carotid system: Patent with soft and calcified plaque in the carotid bulb. 65% stenosis of the ICA at the distal aspect of the  bulb. Left carotid system: Patent with predominantly calcified plaque in the carotid bulb. No evidence of significant stenosis or dissection. Vertebral arteries: Patent without evidence of dissection or a significant stenosis on the left. Calcified plaque at the right vertebral artery origin results in moderate stenosis. Skeleton: Mild compression fractures in the upper thoracic spine, also present on a chest CT last month. Severe facet arthrosis on the left at C2-3 and on the right at C3-4 with trace anterolisthesis at the latter. Fracture involving the anterior wall of the left frontal sinus as described on earlier maxillofacial CT. Other neck: 1.3 cm short axis superficial lymph node along the right lower neck and 1.2 cm short axis left level V lymph node as noted on today's earlier cervical spine CT, both larger than on the 09/03/2020 neck CT. 1.9 x 1.3 cm low-density focus in the left nasopharyngeal region, unchanged from the prior neck CT and potentially reflecting a necrotic retropharyngeal lymph node. Unchanged 0.8 cm right parotid nodule. Upper chest: Severe emphysema. Review of the MIP images confirms the above findings CTA HEAD FINDINGS Anterior circulation: The internal carotid arteries are patent from skull base to carotid termini with minimal nonstenotic plaque. ACAs and MCAs are patent without evidence of a proximal branch occlusion or significant proximal stenosis. The right A1 segment is mildly hypoplastic. No aneurysm is identified. Posterior circulation: The intracranial vertebral arteries are patent to the basilar with mild irregularity but no flow limiting stenosis. 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 There is a small right posterior communicating artery. Both PCAs are patent without evidence of a significant proximal stenosis. No aneurysm is identified. Venous sinuses: Patent. Anatomic variants: None of significance. Review of the MIP images  confirms the above findings IMPRESSION: 1. No large vessel occlusion or significant proximal intracranial stenosis. 2. 65% proximal right ICA stenosis. 3. Moderate right vertebral artery origin stenosis. 4. Bilateral lower neck lymphadenopathy, mildly progressed from the 09/03/2020 neck CT. 5. Aortic Atherosclerosis (ICD10-I70.0) and Emphysema (ICD10-J43.9). Electronically Signed   By: Logan Bores M.D.   On: 10/10/2020 19:54   CT Head Wo Contrast  Result Date: 10/10/2020 CLINICAL DATA:  Fall on bathroom, laceration along the head EXAM: CT HEAD WITHOUT CONTRAST TECHNIQUE: Contiguous axial images were obtained from the base of the skull through the vertex without intravenous contrast. COMPARISON:  09/07/2019 FINDINGS: Brain: The brainstem, cerebellum, cerebral peduncles, thalami, basal ganglia, basilar cisterns, and ventricular system appear within normal limits. Periventricular white matter and corona radiata hypodensities favor chronic ischemic microvascular white matter disease. No intracranial hemorrhage, mass lesion, or acute CVA. Vascular: There is atherosclerotic calcification of the cavernous carotid arteries bilaterally. Skull: Left frontal fracture along the anterior margin of the frontal sinus underlying the scalp hematoma as shown on image 59 series 4. Sinuses/Orbits: There is opacification of several left-sided mastoid air cells. Although there is a fracture of the anterior margin of the left frontal sinus, no fluid is identified in the sinus at this time. Left periorbital hematoma without observed intraorbital abnormality. Other: Left forehead scalp hematoma overlying the frontal fracture. IMPRESSION: 1. Fracture of the anterior wall of the left frontal sinus, with overlying scalp hematoma. 2. No intracranial hemorrhage or specific acute intracranial findings. Periventricular white matter and corona radiata hypodensities favor chronic ischemic microvascular white matter disease. 3. Opacification of  several left-sided mastoid air cells. Electronically Signed   By: Van Clines M.D.   On: 10/10/2020 17:06   CT Angio Neck W and/or Wo Contrast  Result Date: 10/10/2020 CLINICAL DATA:  Fall. History of extensive stage small cell lung cancer. EXAM: CT ANGIOGRAPHY HEAD AND NECK TECHNIQUE: Multidetector CT imaging of the head and neck was performed using the standard protocol during bolus administration of intravenous contrast. Multiplanar CT image reconstructions and MIPs were obtained to evaluate the vascular anatomy. Carotid stenosis measurements (when applicable) are obtained utilizing NASCET criteria, using the distal internal carotid diameter as the denominator. CONTRAST:  22mL OMNIPAQUE IOHEXOL 350 MG/ML SOLN COMPARISON:  Head, cervical spine, and maxillofacial CTs earlier today. Neck CT 09/03/2020. No prior angiographic imaging. FINDINGS: CTA NECK FINDINGS Aortic arch: Standard 3 vessel aortic arch with mild atherosclerotic plaque. No significant arch vessel origin stenosis. Right carotid system: Patent with soft and calcified plaque in the carotid bulb. 65% stenosis of the ICA at the distal aspect of the bulb. Left carotid system: Patent with predominantly calcified plaque in the carotid bulb. No evidence of significant stenosis or dissection. Vertebral arteries: Patent without evidence of dissection or a significant stenosis on the left. Calcified plaque at the right vertebral artery origin results in moderate stenosis. Skeleton: Mild compression fractures in the upper thoracic spine, also present on a chest CT last month. Severe facet arthrosis on the left at C2-3 and on the right at C3-4 with trace anterolisthesis at the latter. Fracture involving the anterior wall of the left frontal sinus as described on earlier maxillofacial CT. Other neck: 1.3 cm short axis superficial lymph node along the right lower  neck and 1.2 cm short axis left level V lymph node as noted on today's earlier cervical spine  CT, both larger than on the 09/03/2020 neck CT. 1.9 x 1.3 cm low-density focus in the left nasopharyngeal region, unchanged from the prior neck CT and potentially reflecting a necrotic retropharyngeal lymph node. Unchanged 0.8 cm right parotid nodule. Upper chest: Severe emphysema. Review of the MIP images confirms the above findings CTA HEAD FINDINGS Anterior circulation: The internal carotid arteries are patent from skull base to carotid termini with minimal nonstenotic plaque. ACAs and MCAs are patent without evidence of a proximal branch occlusion or significant proximal stenosis. The right A1 segment is mildly hypoplastic. No aneurysm is identified. Posterior circulation: The intracranial vertebral arteries are patent to the basilar with mild irregularity but no flow limiting stenosis. 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 There is a small right posterior communicating artery. Both PCAs are patent without evidence of a significant proximal stenosis. No aneurysm is identified. Venous sinuses: Patent. Anatomic variants: None of significance. Review of the MIP images confirms the above findings IMPRESSION: 1. No large vessel occlusion or significant proximal intracranial stenosis. 2. 65% proximal right ICA stenosis. 3. Moderate right vertebral artery origin stenosis. 4. Bilateral lower neck lymphadenopathy, mildly progressed from the 09/03/2020 neck CT. 5. Aortic Atherosclerosis (ICD10-I70.0) and Emphysema (ICD10-J43.9). Electronically Signed   By: Logan Bores M.D.   On: 10/10/2020 19:54   CT Chest W Contrast  Result Date: 10/10/2020 CLINICAL DATA:  Fall in the bathroom. Left hip pain. Stage IV small cell lung cancer. EXAM: CT CHEST, ABDOMEN, AND PELVIS WITH CONTRAST TECHNIQUE: Multidetector CT imaging of the chest, abdomen and pelvis was performed following the standard protocol during bolus administration of intravenous contrast. CONTRAST:  80 cc Omnipaque  300 IV COMPARISON:  CT chest abdomen pelvis 09/03/2020. FINDINGS: CT CHEST FINDINGS Cardiovascular: No evidence of aortic or acute vascular injury. Moderate aortic atherosclerosis. Right chest port with tip in the lower SVC. Normal heart size with coronary artery calcifications. No pericardial fluid. Mediastinum/Nodes: No mediastinal hemorrhage or hematoma. No enlarged mediastinal or hilar lymph nodes. No pneumomediastinum. Decompressed esophagus. No thyroid nodule. Lungs/Pleura: No pneumothorax or pulmonary contusion. Moderately advanced emphysema. New from prior exam is frothy debris layering in the trachea and tracking into the left greater than right mainstem bronchi. Dependent opacity at the left lung base favored to be atelectasis. Multiple pulmonary nodules are again seen. Accurate measurement of these nodules is limited on the current exam due to motion. Dominant nodules are in the right lower lobe on series 5, image 117 and 123, and in the left lower lobe image 119. These are grossly stable, but again not well measured. No pleural fluid. Musculoskeletal: Breathing motion limits detailed rib assessment. No displaced rib fracture. No evidence of acute fracture of the sternum, included clavicles, or shoulder girdles. No fracture of the thoracic spine. No evidence of focal bone lesion. No confluent chest wall abnormality. CT ABDOMEN PELVIS FINDINGS Hepatobiliary: No hepatic injury or perihepatic hematoma. Tiny hypodense lesion within segment 4 of the liver is too small to characterize but unchanged. Unremarkable gallbladder. Pancreas: No evidence of injury. No ductal dilatation or inflammation. Spleen: No splenic injury or perisplenic hematoma. Splenule anteriorly. Adrenals/Urinary Tract: No adrenal nodule. No adrenal hemorrhage. No evidence of renal injury. No hydronephrosis. There are bilateral renal cysts as well as low-density lesions too small to accurately characterize, grossly stable from CT last month.  Punctate calcification in the  anterior dome of the bladder is unchanged. No bladder wall thickening or injury Stomach/Bowel: No evidence of bowel injury or acute abnormality. Lack of enteric contrast as well as patient motion limits detailed assessment. Decompressed stomach. No bowel inflammation. There is no free air. Normal appendix. Sigmoid diverticulosis without diverticulitis. Vascular/Lymphatic: No vascular injury. No acute vascular findings. Moderate aortic atherosclerosis without acute aortic abnormality. IVC is intact. No retroperitoneal fluid. No abdominopelvic adenopathy. Reproductive: Prostate is unremarkable. Other: No ascites or free fluid. No free air. Confluent body wall contusion. Musculoskeletal: Displaced, angulated mildly comminuted left femoral neck fracture. No obvious underlying bone lesion on CT. The remainder of the bony pelvis is intact. Nondisplaced left L3 transverse process fracture. IMPRESSION: 1. Displaced, angulated, mildly comminuted left femoral neck fracture. 2. Nondisplaced left L3 transverse process fracture. 3. No additional acute traumatic injury to the chest, abdomen, or pelvis. 4. New frothy debris layering in the trachea and tracking into the left greater than right mainstem bronchi, retained secretions or aspiration. 5. Multiple pulmonary nodules are grossly stable from CT last month. Please note the detailed nodule evaluation is limited on the current exam due to motion artifact. 6. Chronic findings of colonic diverticulosis without diverticulitis. Moderate emphysema. Aortic Atherosclerosis (ICD10-I70.0) and Emphysema (ICD10-J43.9). Electronically Signed   By: Keith Rake M.D.   On: 10/10/2020 17:14   CT Cervical Spine Wo Contrast  Result Date: 10/10/2020 CLINICAL DATA:  Fall, head injury. EXAM: CT CERVICAL SPINE WITHOUT CONTRAST TECHNIQUE: Multidetector CT imaging of the cervical spine was performed without intravenous contrast. Multiplanar CT image  reconstructions were also generated. COMPARISON:  CT neck dated 09/03/2020 FINDINGS: Alignment: No vertebral subluxation is observed. Skull base and vertebrae: No cervical spine fracture or acute bony findings. Soft tissues and spinal canal: Atherosclerotic calcification of the carotid arteries and branch vessels. Superficial lymph node along the right lower neck measures 1.3 cm in short axis on image 69 of series 5, previously 1.0 cm. Hypodensity along the left posterior nasopharyngeal margin measuring 2.1 by 1.8 cm, similar to the prior exam. Left level V lymph node measures 1.2 cm in short axis on image 64 series 5, previously 0.9 cm. Disc levels: Mild left foraminal stenosis at C2-3, and right foraminal stenosis at C3-4 due to facet spurring. Upper chest: Severe emphysema in the lung apices. Biapical pleuroparenchymal scarring. Mucus in the trachea. Other: Right central line partially observed. IMPRESSION: 1. No acute cervical spine findings. 2. Enlargement of 2 lymph nodes in the lower neck compared to the 09/03/2020 CT of the neck. This may be a manifestation of the patient's known malignancy. 3. Foraminal narrowing at C2-3 and C3-4 due to spurring. 4. Severe emphysema. 5. Carotid atherosclerosis. 6. Stable hypodensity along the left posterior nasopharynx, potentially a mucous retention cyst or necrotic lymph node. Electronically Signed   By: Van Clines M.D.   On: 10/10/2020 17:13   CT ABDOMEN PELVIS W CONTRAST  Result Date: 10/10/2020 CLINICAL DATA:  Fall in the bathroom. Left hip pain. Stage IV small cell lung cancer. EXAM: CT CHEST, ABDOMEN, AND PELVIS WITH CONTRAST TECHNIQUE: Multidetector CT imaging of the chest, abdomen and pelvis was performed following the standard protocol during bolus administration of intravenous contrast. CONTRAST:  80 cc Omnipaque 300 IV COMPARISON:  CT chest abdomen pelvis 09/03/2020. FINDINGS: CT CHEST FINDINGS Cardiovascular: No evidence of aortic or acute vascular  injury. Moderate aortic atherosclerosis. Right chest port with tip in the lower SVC. Normal heart size with coronary artery calcifications. No pericardial fluid. Mediastinum/Nodes:  No mediastinal hemorrhage or hematoma. No enlarged mediastinal or hilar lymph nodes. No pneumomediastinum. Decompressed esophagus. No thyroid nodule. Lungs/Pleura: No pneumothorax or pulmonary contusion. Moderately advanced emphysema. New from prior exam is frothy debris layering in the trachea and tracking into the left greater than right mainstem bronchi. Dependent opacity at the left lung base favored to be atelectasis. Multiple pulmonary nodules are again seen. Accurate measurement of these nodules is limited on the current exam due to motion. Dominant nodules are in the right lower lobe on series 5, image 117 and 123, and in the left lower lobe image 119. These are grossly stable, but again not well measured. No pleural fluid. Musculoskeletal: Breathing motion limits detailed rib assessment. No displaced rib fracture. No evidence of acute fracture of the sternum, included clavicles, or shoulder girdles. No fracture of the thoracic spine. No evidence of focal bone lesion. No confluent chest wall abnormality. CT ABDOMEN PELVIS FINDINGS Hepatobiliary: No hepatic injury or perihepatic hematoma. Tiny hypodense lesion within segment 4 of the liver is too small to characterize but unchanged. Unremarkable gallbladder. Pancreas: No evidence of injury. No ductal dilatation or inflammation. Spleen: No splenic injury or perisplenic hematoma. Splenule anteriorly. Adrenals/Urinary Tract: No adrenal nodule. No adrenal hemorrhage. No evidence of renal injury. No hydronephrosis. There are bilateral renal cysts as well as low-density lesions too small to accurately characterize, grossly stable from CT last month. Punctate calcification in the anterior dome of the bladder is unchanged. No bladder wall thickening or injury Stomach/Bowel: No evidence of  bowel injury or acute abnormality. Lack of enteric contrast as well as patient motion limits detailed assessment. Decompressed stomach. No bowel inflammation. There is no free air. Normal appendix. Sigmoid diverticulosis without diverticulitis. Vascular/Lymphatic: No vascular injury. No acute vascular findings. Moderate aortic atherosclerosis without acute aortic abnormality. IVC is intact. No retroperitoneal fluid. No abdominopelvic adenopathy. Reproductive: Prostate is unremarkable. Other: No ascites or free fluid. No free air. Confluent body wall contusion. Musculoskeletal: Displaced, angulated mildly comminuted left femoral neck fracture. No obvious underlying bone lesion on CT. The remainder of the bony pelvis is intact. Nondisplaced left L3 transverse process fracture. IMPRESSION: 1. Displaced, angulated, mildly comminuted left femoral neck fracture. 2. Nondisplaced left L3 transverse process fracture. 3. No additional acute traumatic injury to the chest, abdomen, or pelvis. 4. New frothy debris layering in the trachea and tracking into the left greater than right mainstem bronchi, retained secretions or aspiration. 5. Multiple pulmonary nodules are grossly stable from CT last month. Please note the detailed nodule evaluation is limited on the current exam due to motion artifact. 6. Chronic findings of colonic diverticulosis without diverticulitis. Moderate emphysema. Aortic Atherosclerosis (ICD10-I70.0) and Emphysema (ICD10-J43.9). Electronically Signed   By: Keith Rake M.D.   On: 10/10/2020 17:14   CT Maxillofacial Wo Contrast  Result Date: 10/10/2020 CLINICAL DATA:  Facial trauma EXAM: CT MAXILLOFACIAL WITHOUT CONTRAST TECHNIQUE: Multidetector CT imaging of the maxillofacial structures was performed. Multiplanar CT image reconstructions were also generated. COMPARISON:  CT head 10/10/2020 FINDINGS: Osseous: Acute fracture of the anterior wall of the left frontal sinus with overlying scalp hematoma  as on image 75 series 5. No significant fluid is currently present in the sinus. No other facial fractures are identified. Orbits: Left periorbital hematoma but without intraorbital abnormality observed. Sinuses: As noted above there is a fracture along the anterior wall of the left frontal sinus but without a fluid level in the sinus. Opacification of several mastoid air cells noted compatible with a small  left mastoid effusion. Soft tissues: Left forehead scalp hematoma extending into the periorbital region. Limited intracranial: Please see CT head report. IMPRESSION: 1. Acute fracture the anterior wall of the left frontal sinus with overlying scalp hematoma. 2. Small left mastoid effusion. Electronically Signed   By: Van Clines M.D.   On: 10/10/2020 17:15        Scheduled Meds: . chlorhexidine  60 mL Topical Once  . Chlorhexidine Gluconate Cloth  6 each Topical Daily  . povidone-iodine  2 application Topical Once   Continuous Infusions: . 0.45 % NaCl with KCl 20 mEq / L 125 mL/hr at 10/11/20 0944  .  ceFAZolin (ANCEF) IV    . ceFEPime (MAXIPIME) IV 2 g (10/11/20 0948)  . vancomycin       LOS: 1 day   Time spent: 75mins Greater than 50% of this time was spent in counseling, explanation of diagnosis, planning of further management, and coordination of care.   Voice Recognition Viviann Spare dictation system was used to create this note, attempts have been made to correct errors. Please contact the author with questions and/or clarifications.   Florencia Reasons, MD PhD FACP [facility]  Available via Epic secure chat 7am-7pm for nonurgent issues Please page for urgent issues To page the attending provider between 7A-7P or the covering provider during after hours 7P-7A, please log into the web site [email or web address] and access using universal [place] password for that web site. If you do not have the password, please call the hospital operator.    10/11/2020, 11:55 AM

## 2020-10-11 NOTE — Consult Note (Signed)
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 Found by brother 3/30 after he had been unable to reach pt-- unknown period of time down (1-3days suspected). Fractured L frontal sinus,L L3 transverse process, displaced angulated mildly comminuted L femoral neck fx. AKI with rhabdo.   On 3/31 pt worsening hypoxia and respiratory distress. PCCM consulted in this setting   Pertinent  Medical History  CAD MI COPD Metastatic small call lung cancer Anxiety Tobacco use    Significant Hospital Events: Including procedures, antibiotic start and stop dates in addition to other pertinent events   . 3/30 admitted to [facility] for encephalopathy. Facial fx, L3 TP fx, Hip fx.  . 3/31 non op from [place] standpoint. Ortho planned to OR 3/31 however acute hypoxic respiratory failure, transferred to ICU and emergently intubated.   Interim History / Subjective:  Emergently intubating in ICU   Objective   Blood pressure (!) 162/67, pulse 87, temperature 98.6 F (37 C), temperature source Oral, resp. rate 18, height _0  (1.753 m), weight 66.7 kg, SpO2 91 %.        Intake/Output Summary (Last 24 hours) at 10/11/2020 1211 Last data filed at 10/11/2020 7989 Gross per 24 hour  Intake 1000 ml  Output 200 ml  Net 800 ml   Filed Weights   10/10/20 1538  Weight: 66.7 kg    Examination: General: Chronically and critically ill appearing thin adult M, appears older than stated age. Intubated sedated NAD  HENT: Temporal muscle wasting. L frontal/periorbital ecchymosis and edema. ETT secure  Lungs: Symmetrical chest expansion, not much air movement. R sided faint expiratory  wheeze. Tan resp secretions Cardiovascular: tachycardic. s1s2 cap refill brisk  Abdomen: soft flat ndnt  Extremities: Clubbing. No pitting edema. No cyanosis Neuro: Sedated and chemically paralyzed at time of exam GU: defer  Labs/imaging that I havepersonally reviewed  (right click and "Reselect all SmartList Selections" daily)  CK 4200 (3/30)  3/31: CMP CBC- CO2 19 BUN 41 Cr 1.03 Alk phos 52 AST 127 ALT 91. WBC 10 hgb 11 plt 136 LA 2.8 ABG 7.10/09/44  3/30 CTs with L frontal sinus fx, L2 TP fx, L femoral neck fx 3/31 CXR >>>  Resolved Hospital Problem list     Assessment & Plan:   Acute encephalopathy -hypoxia v ?concussion r/t fall, metabolic component  Chronic pain - home percocet and gabapantin Hx anxiety - home remeron, atarax, xanax  P -intubating emergently  -PAD, goal 0 to -1: precedex fentanyl -MRI when able  -home gabapentin per tube  -holding home anxiolysis    Acute hypoxic respiratory failure Hx COPD P -emergently intubating  -ABG, CXR -Bronch after intubation -- has thick secretions, will send cx if able. Cont abx for now -PRN BDs  Metastatic small cell carcinoma -lungs, liver  -follows with Dr Delton Coombes, is on 13th cycle of chemo (aloxi)  L femoral neck Fx -ortho following -plan was hemiarthroplasty 3/31 -- if stabilizes after intubation might still be able to go to OR   L facial fx L3 TP fx -NSGY evaluated, no surgical intervention indicated   Elevated CK -repeat CK  -follow renal indices  -IVF  CAD  -holding home ASA  Tobacco abuse disorder  -cessation counseling when able  Best practice (right click and "Reselect all SmartList Selections" daily)  Diet:  NPO Pain/Anxiety/Delirium protocol (if indicated): Yes (RASS goal 0) VAP protocol (if indicated): Yes DVT prophylaxis: Contraindicated (had been held for OR)  GI prophylaxis: PPI Glucose control:  SSI Yes Central venous access:  N/A (has port)  Arterial line:  N/A Foley:   N/A Mobility:  bed rest  PT consulted: N/A Last date of multidisciplinary goals of care discussion [pending] Code Status:  full code Disposition: transfer to ICU   Labs   CBC: Recent Labs  Lab 10/10/20 1602 10/11/20 0247  WBC 11.8* 10.0  HGB 12.6* 11.0*  HCT 38.3* 33.3*  MCV 96.5 98.2  PLT 166 136*    Basic Metabolic Panel: Recent Labs  Lab 10/10/20 1602 10/11/20 0247  NA 143 143  K 3.4* 3.5  CL 106 116*  CO2 24 19*  GLUCOSE 120* 93  BUN 50* 41*  CREATININE 1.14 1.03  CALCIUM 10.0 8.7*  MG 2.4  --    GFR: Estimated Creatinine Clearance: 63.9 mL/min (by C-G formula based on SCr of 1.03 mg/dL). Recent Labs  Lab 10/10/20 1600 10/10/20 1602 10/10/20 1825 10/11/20 0247  WBC  --  11.8*  --  10.0  LATICACIDVEN 2.6*  --  2.8*  --     Liver Function Tests: Recent Labs  Lab 10/10/20 1602 10/11/20 0247  AST 209* 127*  ALT 117* 91*  ALKPHOS 60 52  BILITOT 1.3* 1.3*  PROT 7.4 6.2*  ALBUMIN 4.2 3.4*   Recent Labs  Lab 10/10/20 1602  LIPASE 27   No results for input(s): AMMONIA in the last 168 hours.  ABG    Component Value Date/Time   PHART 7.376 10/11/2020 1115   PCO2ART 29.6 (L) 10/11/2020 1115   PO2ART 46.1 (L) 10/11/2020 1115   HCO3 16.6 (L) 10/11/2020 1115   TCO2 26 08/16/2010 1202   ACIDBASEDEF 7.3 (H) 10/11/2020 1115   O2SAT 71.9 10/11/2020 1115     Coagulation Profile: Recent Labs  Lab 10/10/20 1602  INR 1.2    Cardiac Enzymes: Recent Labs  Lab 10/10/20 1602  CKTOTAL 4,232*    HbA1C: Hgb A1c MFr Bld  Date/Time Value Ref Range Status  10/20/2013 01:12 PM 5.6 <5.7 % Final    Comment:                                                                           According to the ADA Clinical Practice Recommendations for 2011, when HbA1c is used as a screening test:     >=6.5%   Diagnostic of Diabetes Mellitus            (if abnormal result is confirmed)   5.7-6.4%   Increased risk of developing Diabetes Mellitus    References:Diagnosis and Classification of Diabetes Mellitus,Diabetes ZYSA,6301,60(FUXNA 1):S62-S69 and Standards of Medical Care in         Diabetes - 2011,Diabetes TFTD,3220,25 (Suppl 1):S11-S61.      CBG: Recent Labs  Lab 10/10/20 1650  GLUCAP 117*    Review of Systems:   Unable to obtain, intubated sedated chemically paralyzed   Past Medical  History:  He,  has a past medical history of Anxiety, CAD (coronary artery disease), Cancer ([place]), COPD (chronic obstructive pulmonary disease) ([place]), Depression, Dyspnea, Feeling of chest tightness, Heart palpitations, History of chemotherapy, Myocardial infarction ([place]), Osteopenia, Panic attacks, and Smoker.   Surgical History:   Past Surgical History:  Procedure Laterality Date  . BACK SURGERY  12/24/2000   L5,S1  . CORONARY STENT PLACEMENT  2005   RCA & CX  . HERNIA REPAIR Right 1980's  . INGUINAL HERNIA REPAIR  12/1978   right side  . IR FLUORO GUIDE PORT INSERTION RIGHT  04/02/2017  . IR US GUIDE BX ASP/DRAIN  02/03/2017  . IR US GUIDE VASC ACCESS RIGHT  04/02/2017  . NM MYOCAR PERF WALL MOTION  09/07/2009   No ischemia; EF 51%  . SHOULDER SURGERY Left 08/2010  . SPINE SURGERY  2002   L5-S1     Social History:   reports that he has been smoking cigarettes. He has been smoking about 1.00 pack per day. He has never used smokeless tobacco. He reports current alcohol use. He reports that he does not use drugs.   Family History:  His family history includes Alcohol abuse in his brother; Cancer in his brother; Diabetes in his brother and sister; Heart attack in his father and mother; Heart failure in his mother; Kidney disease in his father.   Allergies Allergies  Allergen Reactions  . Codeine Nausea Only  . Niaspan [Niacin Er]      Home Medications  Prior to Admission medications   Medication Sig Start Date End Date Taking? Authorizing Provider  albuterol (VENTOLIN HFA) 108 (90 Base) MCG/ACT inhaler Inhale 2 puffs into the  lungs every 6 (six) hours as needed for wheezing or shortness of breath.    [provider]  ALPRAZolam (XANAX) 1 MG tablet TAKE (1) TABLET BY MOUTH (4) TIMES DAILY. Patient taking differently: Take 1 mg by mouth in the morning, at noon, in the evening, and at bedtime. 09/17/20   [name], MD  Ascorbic Acid (VITAMIN C) 1000 MG tablet Take 1,000 mg by mouth daily.    [provider]  aspirin EC 81 MG tablet Take 81 mg by mouth daily.    [provider]  atorvastatin (LIPITOR) 40 MG tablet TAKE (1) TABLET BY MOUTH ONCE DAILY. Patient taking differently: Take 40 mg by mouth daily. 06/22/20   [name], MD  calcium-vitamin D (OSCAL WITH D) 250-125 MG-UNIT tablet Take 1 tablet by mouth daily.    [provider]  cholecalciferol (VITAMIN D3) 25 MCG (1000 UT) tablet Take 1,000 Units by mouth daily.    [provider]  docusate sodium (COLACE) 100 MG capsule Take two capsules at bedtime starting the day you receive chemotherapy and then for four days after 12/29/19   Derek Jack, MD  gabapentin (NEURONTIN) 300 MG capsule TAKE (2) CAPSULES BY MOUTH THREE TIMES DAILY. Patient taking differently: Take 600 mg by mouth 3 (three) times daily. 04/23/20   Derek Jack, MD  hydrOXYzine (ATARAX/VISTARIL) 50 MG tablet TAKE ONE TABLET BY MOUTH AT BEDTIME AS NEEDED FOR SLEEP. Patient taking differently: Take 50 mg by mouth at bedtime as needed (sleep). 07/02/20   [name], MD  mirtazapine (REMERON) 15 MG tablet Take 2 tablets (30 mg total) by mouth at bedtime. 08/13/20   Derek Jack, MD  mirtazapine (REMERON) 15 MG tablet TAKE (1) TABLET BY MOUTH AT BEDTIME. Patient taking differently: Take 30 mg by  mouth at bedtime. 08/13/20   Derek Jack, MD  mirtazapine (REMERON) 15 MG tablet TAKE (1) TABLET BY MOUTH AT BEDTIME. Patient taking differently: Take 15 mg by mouth at bedtime. 08/13/20   Derek Jack, MD  Multiple  Vitamins-Minerals (CENTRUM SILVER 50+MEN) TABS Take 1 tablet by mouth daily.    [provider]  ondansetron (ZOFRAN) 8 MG tablet Take 1 tablet (8 mg total) by mouth 2 (two) times daily as needed. Start on the third day after cisplatin chemotherapy. 10/05/17   Holley Bouche, NP  oxyCODONE-acetaminophen (PERCOCET/ROXICET) 5-325 MG tablet TAKE 1 TABLET EVERY 8 HOURS AS NEEDED FOR SEVERE PAIN. Patient taking differently: Take 1 tablet by mouth every 8 (eight) hours as needed for severe pain. 07/05/20   Benay Pike, MD  prochlorperazine (COMPAZINE) 10 MG tablet Take 1 tablet (10 mg total) by mouth every 6 (six) hours as needed (Nausea or vomiting). 11/18/17   Holley Bouche, NP  senna (SENOKOT) 8.6 MG tablet Take 3 tablets by mouth daily.     [provider]  vitamin B-12 (CYANOCOBALAMIN) 100 MCG tablet Take 100 mcg by mouth daily.    [provider]     Critical care time: 42 min     CRITICAL CARE Performed by: Cristal Generous   Total critical care time: 42 minutes  Critical care time was exclusive of separately billable procedures and treating other patients.  Critical care was necessary to treat or prevent imminent or life-threatening deterioration.  Critical care was time spent personally by me on the following activities: development of treatment plan with patient and/or surrogate as well as nursing, discussions with consultants, evaluation of patient's response to treatment, examination of patient, obtaining history from patient or surrogate, ordering and performing treatments and interventions, ordering and review of laboratory studies, ordering and review of radiographic studies, pulse oximetry and re-evaluation of patient's condition.  Eliseo Gum MSN, AGACNP-BC [place] [phone] If no answer, [phone] 10/11/2020, 12:42 PM

## 2020-10-11 NOTE — TOC CAGE-AID Note (Signed)
Transition of Care [facility]) - CAGE-AID Screening   Patient Details  Name: [name] MRN: [id number] Date of Birth: 14-Jun-1951  Transition of Care [facility]): [place] Planas, RN, TRN Phone Number: 10/11/2020, 4:05 PM   CAGE-AID Screening: Substance Abuse Screening unable to be completed due to: : Patient unable to participate (Patient was sedated and intubated)

## 2020-10-11 NOTE — Procedures (Signed)
Intubation Procedure Note  ROYALE SWAMY  [id number]  [birth year]  Date:10/11/20  Time:12:19 PM   Provider Performing:Lurdes Haltiwanger Rodman Pickle    Procedure: Intubation (31500)  Indication(s) Respiratory Failure  Consent Risks of the procedure as well as the alternatives and risks of each were explained to the patient and/or caregiver.  Consent for the procedure was obtained and is signed in the bedside chart   Anesthesia Etomidate, Versed, Fentanyl and Rocuronium   Time Out Verified patient identification, verified procedure, site/side was marked, verified correct patient position, special equipment/implants available, medications/allergies/relevant history reviewed, required imaging and test results available.   Sterile Technique Usual hand hygeine, masks, and gloves were used   Procedure Description Patient positioned in bed supine.  Sedation given as noted above.  Patient was intubated with endotracheal tube using Glidescope.  View was Grade 1 full glottis .  Number of attempts was 1.  Colorimetric CO2 detector was consistent with tracheal placement.   Complications/Tolerance None; patient tolerated the procedure well. Chest X-ray is ordered to verify placement.   EBL Minimal   Specimen(s) None  Rodman Pickle, M.D. [facility] Pulmonary/Critical Care Medicine 10/11/2020 12:20 PM

## 2020-10-12 ENCOUNTER — Inpatient Hospital Stay (HOSPITAL_COMMUNITY): Payer: Medicare HMO

## 2020-10-12 ENCOUNTER — Inpatient Hospital Stay (HOSPITAL_COMMUNITY): Payer: Medicare HMO | Admitting: Anesthesiology

## 2020-10-12 ENCOUNTER — Encounter (HOSPITAL_COMMUNITY)
Admission: EM | Disposition: A | Discharge status: Skilled Nursing Facility | Payer: Self-pay | Source: Home / Self Care | Attending: Internal Medicine

## 2020-10-12 DIAGNOSIS — T17908A Unspecified foreign body in respiratory tract, part unspecified causing other injury, initial encounter: Secondary | ICD-10-CM

## 2020-10-12 DIAGNOSIS — S72002A Fracture of unspecified part of neck of left femur, initial encounter for closed fracture: Secondary | ICD-10-CM | POA: Diagnosis not present

## 2020-10-12 DIAGNOSIS — J9601 Acute respiratory failure with hypoxia: Secondary | ICD-10-CM | POA: Diagnosis not present

## 2020-10-12 DIAGNOSIS — G9340 Encephalopathy, unspecified: Secondary | ICD-10-CM | POA: Diagnosis not present

## 2020-10-12 HISTORY — PX: TOTAL HIP ARTHROPLASTY: SHX124

## 2020-10-12 LAB — COMPREHENSIVE METABOLIC PANEL
ALT: 59 U/L — ABNORMAL HIGH (ref 0–44)
AST: 47 U/L — ABNORMAL HIGH (ref 15–41)
Albumin: 2.7 g/dL — ABNORMAL LOW (ref 3.5–5.0)
Alkaline Phosphatase: 40 U/L (ref 38–126)
Anion gap: 7 (ref 5–15)
BUN: 37 mg/dL — ABNORMAL HIGH (ref 8–23)
CO2: 18 mmol/L — ABNORMAL LOW (ref 22–32)
Calcium: 8.1 mg/dL — ABNORMAL LOW (ref 8.9–10.3)
Chloride: 117 mmol/L — ABNORMAL HIGH (ref 98–111)
Creatinine, Ser: 0.98 mg/dL (ref 0.61–1.24)
GFR, Estimated: >60 mL/min (ref >60)
Glucose, Bld: 106 mg/dL — ABNORMAL HIGH (ref 70–99)
Potassium: 4.2 mmol/L (ref 3.5–5.1)
Sodium: 142 mmol/L (ref 135–145)
Total Bilirubin: 1.2 mg/dL (ref 0.3–1.2)
Total Protein: 5.3 g/dL — ABNORMAL LOW (ref 6.5–8.1)

## 2020-10-12 LAB — GLUCOSE, CAPILLARY
Glucose-Capillary: 117 mg/dL — ABNORMAL HIGH (ref 70–99)
Glucose-Capillary: 127 mg/dL — ABNORMAL HIGH (ref 70–99)
Glucose-Capillary: 131 mg/dL — ABNORMAL HIGH (ref 70–99)
Glucose-Capillary: 78 mg/dL (ref 70–99)

## 2020-10-12 LAB — CBC
HCT: 27.1 % — ABNORMAL LOW (ref 39.0–52.0)
Hemoglobin: 9 g/dL — ABNORMAL LOW (ref 13.0–17.0)
MCH: 32.1 pg (ref 26.0–34.0)
MCHC: 33.2 g/dL (ref 30.0–36.0)
MCV: 96.8 fL (ref 80.0–100.0)
Platelets: 91 10*3/uL — ABNORMAL LOW (ref 150–400)
RBC: 2.8 MIL/uL — ABNORMAL LOW (ref 4.22–5.81)
RDW: 18.9 % — ABNORMAL HIGH (ref 11.5–15.5)
WBC: 14.6 10*3/uL — ABNORMAL HIGH (ref 4.0–10.5)
nRBC: 0 % (ref 0.0–0.2)

## 2020-10-12 LAB — URINE CULTURE: Culture: NO GROWTH

## 2020-10-12 LAB — POCT I-STAT 7, (LYTES, BLD GAS, ICA,H+H)
Acid-base deficit: 6 mmol/L — ABNORMAL HIGH (ref 0.0–2.0)
Bicarbonate: 18.4 mmol/L — ABNORMAL LOW (ref 20.0–28.0)
Calcium, Ion: 1.28 mmol/L (ref 1.15–1.40)
HCT: 32 % — ABNORMAL LOW (ref 39.0–52.0)
Hemoglobin: 10.9 g/dL — ABNORMAL LOW (ref 13.0–17.0)
O2 Saturation: 96 %
Patient temperature: 99.6
Potassium: 4.3 mmol/L (ref 3.5–5.1)
Sodium: 145 mmol/L (ref 135–145)
TCO2: 19 mmol/L — ABNORMAL LOW (ref 22–32)
pCO2 arterial: 33.4 mmHg (ref 32.0–48.0)
pH, Arterial: 7.352 (ref 7.350–7.450)
pO2, Arterial: 91 mmHg (ref 83.0–108.0)

## 2020-10-12 LAB — CK: Total CK: 556 U/L — ABNORMAL HIGH (ref 49–397)

## 2020-10-12 SURGERY — ARTHROPLASTY, HIP, TOTAL, ANTERIOR APPROACH
Anesthesia: General | Site: Hip | Laterality: Left

## 2020-10-12 MED ORDER — ENOXAPARIN SODIUM 40 MG/0.4ML ~~LOC~~ SOLN: 40.0000 mg | Freq: Every day | SUBCUTANEOUS | 0 refills | Status: DC

## 2020-10-12 MED ORDER — DEXTROSE-NACL 5-0.45 % IV SOLN: INTRAVENOUS | Status: DC

## 2020-10-12 MED ORDER — ALBUMIN HUMAN 25 % IV SOLN
12.5000 g | Freq: Once | INTRAVENOUS | Status: AC
  Administered 2020-10-12: 12.5 g via INTRAVENOUS
  Filled 2020-10-12: qty 50

## 2020-10-12 MED ORDER — ALPRAZOLAM 0.5 MG PO TABS
0.5000 mg | ORAL_TABLET | Freq: Three times a day (TID) | ORAL | Status: DC
  Administered 2020-10-12: 0.5 mg
  Filled 2020-10-12: qty 1

## 2020-10-12 MED ORDER — ALPRAZOLAM 0.5 MG PO TABS
0.5000 mg | ORAL_TABLET | Freq: Three times a day (TID) | ORAL | Status: DC
  Administered 2020-10-12 – 2020-10-13 (×2): 0.5 mg via ORAL
  Filled 2020-10-12 (×2): qty 1

## 2020-10-12 MED ORDER — ROCURONIUM BROMIDE 10 MG/ML (PF) SYRINGE
PREFILLED_SYRINGE | INTRAVENOUS | Status: DC | PRN
  Administered 2020-10-12: 70 mg via INTRAVENOUS

## 2020-10-12 MED ORDER — LACTATED RINGERS IV SOLN: INTRAVENOUS | Status: DC | PRN

## 2020-10-12 MED ORDER — BUPIVACAINE-MELOXICAM ER 400-12 MG/14ML IJ SOLN
INTRAMUSCULAR | Status: DC | PRN
  Administered 2020-10-12: 400 mg

## 2020-10-12 MED ORDER — BUPIVACAINE-MELOXICAM ER 400-12 MG/14ML IJ SOLN
INTRAMUSCULAR | Status: AC
  Filled 2020-10-12: qty 1

## 2020-10-12 MED ORDER — PHENYLEPHRINE 40 MCG/ML (10ML) SYRINGE FOR IV PUSH (FOR BLOOD PRESSURE SUPPORT)
PREFILLED_SYRINGE | INTRAVENOUS | Status: AC
  Filled 2020-10-12: qty 10

## 2020-10-12 MED ORDER — GABAPENTIN 250 MG/5ML PO SOLN
300.0000 mg | Freq: Three times a day (TID) | ORAL | Status: DC
  Administered 2020-10-12 – 2020-10-14 (×7): 300 mg via ORAL
  Filled 2020-10-12 (×9): qty 6

## 2020-10-12 MED ORDER — PROPOFOL 10 MG/ML IV BOLUS
INTRAVENOUS | Status: DC | PRN
  Administered 2020-10-12: 100 mg via INTRAVENOUS

## 2020-10-12 MED ORDER — ROCURONIUM BROMIDE 10 MG/ML (PF) SYRINGE
PREFILLED_SYRINGE | INTRAVENOUS | Status: AC
  Filled 2020-10-12: qty 10

## 2020-10-12 MED ORDER — ORAL CARE MOUTH RINSE
15.0000 mL | Freq: Two times a day (BID) | OROMUCOSAL | Status: DC
  Administered 2020-10-12 – 2020-10-14 (×5): 15 mL via OROMUCOSAL

## 2020-10-12 MED ORDER — OXYCODONE-ACETAMINOPHEN 5-325 MG PO TABS: 1.0000 | ORAL_TABLET | Freq: Three times a day (TID) | ORAL | 0 refills | Status: DC | PRN

## 2020-10-12 MED ORDER — VANCOMYCIN HCL 1000 MG IV SOLR
INTRAVENOUS | Status: AC
  Filled 2020-10-12: qty 1000

## 2020-10-12 MED ORDER — FENTANYL CITRATE (PF) 250 MCG/5ML IJ SOLN
INTRAMUSCULAR | Status: AC
  Filled 2020-10-12: qty 5

## 2020-10-12 MED ORDER — TRANEXAMIC ACID 1000 MG/10ML IV SOLN
2000.0000 mg | INTRAVENOUS | Status: AC
  Administered 2020-10-12: 50 mg via TOPICAL
  Filled 2020-10-12 (×2): qty 20

## 2020-10-12 MED ORDER — MIDAZOLAM HCL 2 MG/2ML IJ SOLN
INTRAMUSCULAR | Status: AC
  Filled 2020-10-12: qty 2

## 2020-10-12 MED ORDER — VANCOMYCIN HCL 1 G IV SOLR
INTRAVENOUS | Status: DC | PRN
  Administered 2020-10-12: 1000 mg

## 2020-10-12 MED ORDER — IRRISEPT - 450ML BOTTLE WITH 0.05% CHG IN STERILE WATER, USP 99.95% OPTIME
TOPICAL | Status: DC | PRN
  Administered 2020-10-12: 450 mL via TOPICAL

## 2020-10-12 MED ORDER — DEXTROSE 10 % IV SOLN: INTRAVENOUS | Status: DC

## 2020-10-12 MED ORDER — EPHEDRINE 5 MG/ML INJ
INTRAVENOUS | Status: AC
  Filled 2020-10-12: qty 10

## 2020-10-12 MED ORDER — CEFAZOLIN SODIUM-DEXTROSE 2-3 GM-%(50ML) IV SOLR
INTRAVENOUS | Status: DC | PRN
  Administered 2020-10-12: 2 g via INTRAVENOUS

## 2020-10-12 MED ORDER — MIDAZOLAM HCL 5 MG/5ML IJ SOLN
INTRAMUSCULAR | Status: DC | PRN
  Administered 2020-10-12: 2 mg via INTRAVENOUS

## 2020-10-12 MED ORDER — EPHEDRINE SULFATE-NACL 50-0.9 MG/10ML-% IV SOSY
PREFILLED_SYRINGE | INTRAVENOUS | Status: DC | PRN
  Administered 2020-10-12 (×2): 10 mg via INTRAVENOUS

## 2020-10-12 SURGICAL SUPPLY — 62 items
BAG DECANTER FOR FLEXI CONT (MISCELLANEOUS) ×2 IMPLANT
BIPOLAR PROS AML 52 (Hips) ×2 IMPLANT
CELLS DAT CNTRL 66122 CELL SVR (MISCELLANEOUS) IMPLANT
COVER PERINEAL POST (MISCELLANEOUS) ×2 IMPLANT
COVER SURGICAL LIGHT HANDLE (MISCELLANEOUS) ×2 IMPLANT
COVER WAND RF STERILE (DRAPES) ×2 IMPLANT
DRAPE C-ARM 42X72 X-RAY (DRAPES) ×2 IMPLANT
DRAPE POUCH INSTRU U-SHP 10X18 (DRAPES) ×2 IMPLANT
DRAPE STERI IOBAN 125X83 (DRAPES) ×2 IMPLANT
DRAPE U-SHAPE 47X51 STRL (DRAPES) ×4 IMPLANT
DRSG AQUACEL AG ADV 3.5X10 (GAUZE/BANDAGES/DRESSINGS) ×2 IMPLANT
DURAPREP 26ML APPLICATOR (WOUND CARE) ×4 IMPLANT
ELECT BLADE 4.0 EZ CLEAN MEGAD (MISCELLANEOUS) ×2
ELECT REM PT RETURN 9FT ADLT (ELECTROSURGICAL) ×2
ELECTRODE BLDE 4.0 EZ CLN MEGD (MISCELLANEOUS) ×1 IMPLANT
ELECTRODE REM PT RTRN 9FT ADLT (ELECTROSURGICAL) ×1 IMPLANT
GLOVE ECLIPSE 7.0 STRL STRAW (GLOVE) ×4 IMPLANT
GLOVE SKINSENSE NS SZ7.5 (GLOVE) ×1
GLOVE SKINSENSE STRL SZ7.5 (GLOVE) ×1 IMPLANT
GLOVE SURG SYN 7.5  E (GLOVE) ×8
GLOVE SURG SYN 7.5 E (GLOVE) ×4 IMPLANT
GLOVE SURG SYN 7.5 PF PI (GLOVE) ×4 IMPLANT
GLOVE SURG UNDER POLY LF SZ7 (GLOVE) ×2 IMPLANT
GOWN STRL REIN XL XLG (GOWN DISPOSABLE) ×2 IMPLANT
GOWN STRL REUS W/ TWL LRG LVL3 (GOWN DISPOSABLE) IMPLANT
GOWN STRL REUS W/ TWL XL LVL3 (GOWN DISPOSABLE) ×1 IMPLANT
GOWN STRL REUS W/TWL LRG LVL3 (GOWN DISPOSABLE)
GOWN STRL REUS W/TWL XL LVL3 (GOWN DISPOSABLE) ×2
HANDPIECE INTERPULSE COAX TIP (DISPOSABLE) ×2
HEAD BIPOLAR PROS AML 52 (Hips) IMPLANT
HEAD FEM STD 28X+1.5 STRL (Hips) ×1 IMPLANT
HOOD PEEL AWAY FLYTE STAYCOOL (MISCELLANEOUS) ×4 IMPLANT
IV NS IRRIG 3000ML ARTHROMATIC (IV SOLUTION) ×2 IMPLANT
JET LAVAGE IRRISEPT WOUND (IRRIGATION / IRRIGATOR) ×2
KIT BASIN OR (CUSTOM PROCEDURE TRAY) ×2 IMPLANT
KIT DRSG PREVENA PLUS 7DAY 125 (MISCELLANEOUS) ×1 IMPLANT
KIT PREVENA INCISION MGT 13 (CANNISTER) ×1 IMPLANT
LAVAGE JET IRRISEPT WOUND (IRRIGATION / IRRIGATOR) ×1 IMPLANT
MARKER SKIN DUAL TIP RULER LAB (MISCELLANEOUS) ×2 IMPLANT
NDL SPNL 18GX3.5 QUINCKE PK (NEEDLE) ×1 IMPLANT
NEEDLE SPNL 18GX3.5 QUINCKE PK (NEEDLE) ×2 IMPLANT
PACK TOTAL JOINT (CUSTOM PROCEDURE TRAY) ×2 IMPLANT
PACK UNIVERSAL I (CUSTOM PROCEDURE TRAY) ×2 IMPLANT
RETRACTOR WND ALEXIS 18 MED (MISCELLANEOUS) IMPLANT
RTRCTR WOUND ALEXIS 18CM MED (MISCELLANEOUS)
SAW OSC TIP CART 19.5X105X1.3 (SAW) ×2 IMPLANT
SET HNDPC FAN SPRY TIP SCT (DISPOSABLE) ×1 IMPLANT
STAPLER VISISTAT 35W (STAPLE) IMPLANT
STEM FEMORAL SZ9 STD ACTIS (Stem) ×1 IMPLANT
SUT ETHIBOND 2 V 37 (SUTURE) ×2 IMPLANT
SUT VIC AB 0 CT1 27 (SUTURE) ×2
SUT VIC AB 0 CT1 27XBRD ANBCTR (SUTURE) ×1 IMPLANT
SUT VIC AB 1 CTX 36 (SUTURE) ×2
SUT VIC AB 1 CTX36XBRD ANBCTR (SUTURE) ×1 IMPLANT
SUT VIC AB 2-0 CT1 27 (SUTURE) ×4
SUT VIC AB 2-0 CT1 TAPERPNT 27 (SUTURE) ×2 IMPLANT
SYR 50ML LL SCALE MARK (SYRINGE) ×2 IMPLANT
TOWEL GREEN STERILE (TOWEL DISPOSABLE) ×2 IMPLANT
TRAY CATH 16FR W/PLASTIC CATH (SET/KITS/TRAYS/PACK) IMPLANT
TRAY FOLEY W/BAG SLVR 16FR (SET/KITS/TRAYS/PACK) ×2
TRAY FOLEY W/BAG SLVR 16FR ST (SET/KITS/TRAYS/PACK) ×1 IMPLANT
YANKAUER SUCT BULB TIP NO VENT (SUCTIONS) ×2 IMPLANT

## 2020-10-12 NOTE — Anesthesia Preprocedure Evaluation (Addendum)
Anesthesia Evaluation  Patient identified by MRN, date of birth, ID band Patient awake    Reviewed: Allergy & Precautions, NPO status , Patient's Chart, lab work & pertinent test results  Airway Mallampati: II  TM Distance: >3 FB Neck ROM: Full    Dental no notable dental hx.    Pulmonary COPD, Current Smoker,  stage 4 small cell lung cancer    Pulmonary exam normal breath sounds clear to auscultation       Cardiovascular + CAD, + Past MI and + Cardiac Stents  Normal cardiovascular exam Rhythm:Regular Rate:Normal  ECHO 3/31 1. Left ventricular ejection fraction, by estimation, is 50 to 55%. The  left ventricle has low normal function. The left ventricle has no regional  wall motion abnormalities. Left ventricular diastolic parameters are  indeterminate.  2. Right ventricular systolic function is normal. The right ventricular  size is normal.  3. The mitral valve is grossly normal. Trivial mitral valve  regurgitation.  4. The aortic valve has an indeterminant number of cusps. There is  moderate calcification of the aortic valve. There is mild thickening of  the aortic valve. Aortic valve regurgitation is trivial. Mild to moderate  aortic valve sclerosis/calcification is  present, without any evidence of aortic stenosis.  5. The inferior vena cava is dilated in size with >50% respiratory  variability, suggesting right atrial pressure of 8 mmHg.    Neuro/Psych PSYCHIATRIC DISORDERS Anxiety Depression negative neurological ROS     GI/Hepatic negative GI ROS, Neg liver ROS,   Endo/Other  negative endocrine ROS  Renal/GU negative Renal ROS  negative genitourinary   Musculoskeletal  (+) Arthritis , Osteoarthritis,    Abdominal   Peds negative pediatric ROS (+)  Hematology  (+) anemia ,   Anesthesia Other Findings   Reproductive/Obstetrics negative OB ROS                                                                              Anesthesia Physical Anesthesia Plan        Anesthesia Quick Evaluation  Anesthesia Physical Anesthesia Plan  ASA: IV  Anesthesia Plan: General   Post-op Pain Management:    Induction: Intravenous  PONV Risk Score and Plan: 2 and Ondansetron  Airway Management Planned: Oral ETT  Additional Equipment: None  Intra-op Plan:   Post-operative Plan: Possible Post-op intubation/ventilation  Informed Consent:     Dental advisory given  Plan Discussed with: Anesthesiologist and CRNA  Anesthesia Plan Comments: (  )       Anesthesia Quick Evaluation

## 2020-10-12 NOTE — Anesthesia Postprocedure Evaluation (Signed)
Anesthesia Post Note  Patient: [name]  Procedure(s) Performed: TOTALhemi  HIP ARTHROPLASTY ANTERIOR APPROACH (Left Hip)     Patient location during evaluation: PACU Anesthesia Type: General Level of consciousness: awake and alert, oriented and patient cooperative Pain management: pain level controlled Vital Signs Assessment: post-procedure vital signs reviewed and stable Respiratory status: spontaneous breathing, nonlabored ventilation and respiratory function stable Cardiovascular status: blood pressure returned to baseline and stable Postop Assessment: no apparent nausea or vomiting Anesthetic complications: no   No complications documented.  Last Vitals:  Vitals:   10/12/20 1200 10/12/20 1400  BP: 102/62 120/60  Pulse: 67 (!) 55  Resp: 19 14  Temp:    SpO2: 100% 100%    Last Pain:  Vitals:   10/12/20 1117  TempSrc: Axillary                 Pervis Hocking

## 2020-10-12 NOTE — Progress Notes (Signed)
[facility] Progress Note Patient Name: [name] DOB: [birth year] MRN: [id number]   Date of Service  10/12/2020  HPI/Events of Note  Hypoglycemia - Blood glucose = 78.   eICU Interventions  Plan: 1. D10W IV fluid at 40 mL/hour.      Intervention Category Major Interventions: Other:  Lysle Dingwall 10/12/2020, 4:25 AM

## 2020-10-12 NOTE — Progress Notes (Signed)
eLink Physician-Brief Progress Note Patient Name: [name] DOB: [birth year] MRN: [id number]   Date of Service  10/12/2020  HPI/Events of Note  Notified of hypogastric discomfort Bladder scan >300cc  eICU Interventions  Will try in and out cath to see if this will afford relief     Intervention Category Intermediate Interventions: Pain - evaluation and management  Judd Lien 10/12/2020, 11:59 PM

## 2020-10-12 NOTE — Op Note (Addendum)
TOTALhemi  HIP ARTHROPLASTY ANTERIOR APPROACH  Procedure Note [name]   [id number]  Pre-op Diagnosis: displaced left femoral neck fracture     Post-op Diagnosis: same   Operative Procedures  1. Prosthetic replacement for femoral neck fracture. CPT [phone] 2. Application of incisional wound vac CPT [phone]  Personnel  Surgeon(s): [name], MD  ASSIST: Madalyn Rob, PA-C   Anesthesia: general  Prosthesis: Depuy Femur: Actis 9 STD Head: 52/28 size: +1.5 Bearing Type: bipolar  Hip Hemiarthroplasty (Anterior Approach) Op Note:  After informed consent was obtained and the operative extremity marked in the holding area, the patient was brought back to the operating room and placed supine on the HANA table. Next, the operative extremity was prepped and draped in normal sterile fashion. Surgical timeout occurred verifying patient identification, surgical site, surgical procedure and administration of antibiotics.  A modified anterior Smith-Peterson approach to the hip was performed, using the interval between tensor fascia lata and sartorius.  Dissection was carried bluntly down onto the anterior hip capsule. The lateral femoral circumflex vessels were identified and coagulated. A capsulotomy was performed and fracture hematoma was evacuated and the capsular flaps tagged for later repair.  The neck osteotomy was performed below the fracture. The femoral head was removed and found a 52 mm head was the appropriate fit.    We then turned our attention to the femur.  After placing the femoral hook, the leg was taken to externally rotated, extended and adducted position taking care to perform soft tissue releases to allow for adequate mobilization of the femur. Soft tissue was cleared from the shoulder of the greater trochanter and the hook elevator used to improve exposure of the proximal femur. Sequential broaching performed up to a size 9. Trial neck and head were placed. The leg  was brought back up to neutral and the construct reduced. The position and sizing of components, offset and leg lengths were checked using fluoroscopy. Stability of the construct was checked in extension and external rotation without any subluxation or impingement of prosthesis. We dislocated the prosthesis, dropped the leg back into position, removed trial components, and irrigated copiously. The final stem and head was then placed, the leg brought back up, the system reduced and fluoroscopy used to verify positioning.  We irrigated, obtained hemostasis and closed the capsule using #2 ethibond suture.  The fascia was closed with #1 vicryl plus, the deep fat layer was closed with 0 vicryl, the subcutaneous layers closed with 2.0 Vicryl Plus and the skin closed with 2.0 nylon and prevena incisional vac was placed. A sterile dressing was applied. The patient was awakened in the operating room and taken to recovery in stable condition. All sponge, needle, and instrument counts were correct at the end of the case.   Tawanna Cooler, my PA, was necessary for opening, closing, exposing, retracting, limb positioning and overall facilitation and completion of the surgery.  Position: supine  Complications: see description of procedure.  Time Out: performed   Drains/Packing: none  Estimated blood loss: see anesthesia record  Returned to Recovery Room: in good condition.   Antibiotics: yes   Mechanical VTE (DVT) Prophylaxis: sequential compression devices, TED thigh-high  Chemical VTE (DVT) Prophylaxis: lovenox  Fluid Replacement: Crystalloid: see anesthesia record  Specimens Removed: 1 to pathology   Sponge and Instrument Count Correct? yes   PACU: portable radiograph - low AP   Admission: inpatient status, start PT & OT POD#1  Plan/RTC: Return in 2 weeks  for staple removal. Return in 6 weeks to see MD.  Weight Bearing/Load Lower Extremity: full  Hip precautions: none  N. Eduard Roux,  MD [facility] 1:00 PM   * No implants in log *

## 2020-10-12 NOTE — Progress Notes (Signed)
Pt taken off vent at this time so CRNA could manually ventilate to OR

## 2020-10-12 NOTE — Progress Notes (Addendum)
[place] Progress Note Patient Name: [name] DOB: 05-Aug-1950 MRN: [id number]   Date of Service  10/12/2020  HPI/Events of Note  Hypotension - BP = 94/50 with MAP = 65. Last pH at 1:47 PM yesterday 7.12. No follow up ABG ordered. LVEF = 50-55%  eICU Interventions  Plan: 1. 25% Albumin 12.5 gm IV now. 2. Send AM labs now.  3. ABG STAT.     Intervention Category Major Interventions: Hypotension - evaluation and management  Bayani Renteria Eugene 10/12/2020, 1:07 AM

## 2020-10-12 NOTE — Progress Notes (Signed)
Initial Nutrition Assessment  RD working remotely.  DOCUMENTATION CODES:   Not applicable, suspect malnutrition  INTERVENTION:   Tube feeding recommendations: - Vital 1.5 @ 50 ml/hr (1200 ml/day) - ProSource TF daily  Recommended tube feeding regimen would provide 1840 kcal, 92 grams of protein, and 917 ml of H2O.   NUTRITION DIAGNOSIS:   Inadequate oral intake related to inability to eat as evidenced by NPO status.  GOAL:   Patient will meet greater than or equal to 90% of their needs  MONITOR:   Vent status,Labs,Weight trends,Skin  REASON FOR ASSESSMENT:   Ventilator    ASSESSMENT:   70 year old male who presented to the ED on 3/30 with AMS after a fall. Pt with unknown time down (1-3 days suspected). 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 Pt found to have left hip fracture, left L3 transverse process fracture, left frontal sinus fracture as well as rhabdomyolysis.   3/31 - rapid response for respiratory distress, transferred to ICU, intubated, therapeutic bronchoscopy  Noted plan for repair of left hip fracture today. Per CCM, "will start tube feeds in 24 hours if longer intubation required." RD to leave tube feeding recommendations. Pt with OG tube in stomach per abdominal x-ray.  Unable to obtain diet and weight history at this time. [place] RD notes. Last visit with Stanly RD was on 08/19/19. At that point pt was maintaining his weight. Reviewed available weight history in chart. Pt with a weight loss of 6.7 kg since 08/23/20. This is a 9.7% weight loss in 2 months which is severe and significant for timeframe. Suspect pt with malnutrition but unable to confirm at this time.  Patient is currently intubated on ventilator support MV: 12.8 L/min Temp (24hrs), Avg:98.9 F (37.2 C), Min:98.1 F (36.7 C), Max:100.2 F (37.9 C) BP (cuff):  105/54 MAP (cuff): 67  Drips: Precedex Fentanyl D5 1/2NS: 125 ml/hr  Medications reviewed and include: colace, miralax, IV abx  Labs reviewed: BUN 37, elevated LFTs, hemoglobin 9.0 CBG's: 65-200 x 24 hours  UOP: 550 ml x 24 hours I/O's: +4.4 L since admit  NUTRITION - FOCUSED PHYSICAL EXAM:  Unable to complete at this time. RD working remotely.  Diet Order:   Diet Order            Diet NPO time specified  Diet effective midnight                 EDUCATION NEEDS:   No education needs have been identified at this time  Skin:  Skin Assessment: Skin Integrity Issues: Stage I: sacrum  Last BM:  no documented BM  Height:   Ht Readings from Last 1 Encounters:  10/11/20 5\' 9"  (1.753 m)    Weight:   Wt Readings from Last 1 Encounters:  10/12/20 62.5 kg    BMI:  Body mass index is 20.35 kg/m.  Estimated Nutritional Needs:   Kcal:  1850  Protein:  85-100 grams  Fluid:  >/= 1.8 L    Gustavus Bryant, MS, RD, LDN Inpatient Clinical Dietitian Please see AMiON for contact information.

## 2020-10-12 NOTE — Progress Notes (Addendum)
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 Found by brother 3/30 after he had been unable to reach pt-- unknown period of time down (1-3days suspected). Fractured L frontal sinus,L L3 transverse process, displaced angulated mildly comminuted L femoral neck fx. AKI with rhabdo.   On 3/31 pt worsening hypoxia and respiratory distress >> intubated  Pertinent  Medical History  CAD MI COPD Metastatic small call lung cancer Anxiety Tobacco use    Significant Hospital Events: Including procedures, antibiotic start and stop dates in addition to other pertinent events   . 3/30 admitted to [facility] for encephalopathy. Facial fx, L3 TP fx, Hip fx.  . 3/31 non op from [facility] standpoint. Ortho planned to OR 3/31 however acute hypoxic respiratory failure, transferred to ICU and emergently intubated.    3/31 ETT >>   Interim History / Subjective:   Hemodynamically stable overnight , low-grade fever Hypoglycemic overnight started on D10. FiO2 decreased Decreased urine output  Objective   Blood pressure (!) 97/52, pulse 65, temperature 99.1 F (37.3 C), temperature source Oral, resp. rate 16, height _0  (1.753 m), weight 62.5 kg, SpO2 100 %.    Vent Mode: PRVC FiO2 (%):  [40 %-100 %] 40 % Set Rate:  [15 bmp-18 bmp] 18 bmp Vt Set:  [420 mL-560 mL] 420 mL PEEP:  [5 cmH20-10 cmH20] 10 cmH20 Plateau Pressure:  [10 cmH20-20 cmH20] 10 cmH20   Intake/Output Summary (Last 24 hours) at 10/12/2020 0751 Last data filed at 10/12/2020 0600 Gross per 24 hour  Intake 3774.44 ml  Output 550 ml  Net 3224.44 ml   Filed Weights   10/10/20 1538 10/12/20 0434  Weight: 66.7  kg 62.5 kg    Examination: General: Chronically and critically ill appearing thin adult M, appears older than stated age.  Intubated and sedated on Precedex and fentanyl, no distress HENT: Temporal muscle wasting. L frontal/periorbital ecchymosis and edema.  Left subconjunctival hemorrhage oral ETT Lungs: No accessory muscle use, bilateral air entry, few scattered rhonchi Cardiovascular: S1-S2 regular, no murmur Abdomen: soft flat ndnt  Extremities: Clubbing. No pitting edema. No cyanosis Neuro: Sedated, does not follow commands GU: defer  Labs/imaging that I havepersonally reviewed  (right click and "Reselect all SmartList Selections" daily)  ABG appears improved Labs show normal electrolytes, decrease creatinine, CK decreased to 556, mild leukocytosis, hemoglobin dropped from 11-9,  thrombocytopenia-rapid dropped from 1 66-91K  3/30 CT head/chest abdomen pelvis with L frontal sinus fx, L3 left transverse process fx, L femoral neck fx , multiple nodules stable from CT February, aspiration debris and bronchi 3/31 BAL culture >>  Resolved Hospital Problem list     Assessment & Plan:   Acute encephalopathy -hypoxia v ?concussion r/t fall, metabolic component  Chronic pain - home percocet and gabapantin Hx anxiety - home remeron, atarax, xanax  P -Await MRI brain -PAD, RASS goal 0 with Precedex and fentanyl -home gabapentin per tube  -Resume home Xanax half dose to prevent withdrawal -chest PT -dc vanc, ct cefepime   Acute hypoxic respiratory failure Hx COPD Bronchoscopy 3/30 thick secretions suctioned out Aspiration pna P -Hold off SBT/W UA until after surgery today -Tracheobronchial  toilet, drop PEEP to 8 -PRN BDs  Metastatic small cell carcinoma -lungs, liver  -follows with Dr Delton Coombes, is on 13th cycle of chemo (aloxi) -MRI will ascertain whether brain mets  L femoral neck Fx -ortho following -2 OR for hemiarthroplasty 4/1  L facial fx L3 TP fx -NSGY  evaluated, no surgical intervention indicated   Oliguria Elevated CK -Decreasing with IV fluids, change fluids to D5 half-normal instead of D10  Hypoglycemia -dextrose containing fluids, will start tube feeds in 24 hours if longer intubation required  CAD  -holding home ASA   Thrombocytopenia-rapid drop from 1 66-91K , 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 -Monitor  Tobacco abuse disorder  -cessation counseling when able  Best practice (right click and "Reselect all SmartList Selections" daily)  Diet:  NPO Pain/Anxiety/Delirium protocol (if indicated): Yes (RASS goal 0) VAP protocol (if indicated): Yes DVT prophylaxis: Contraindicated (held for OR)  GI prophylaxis: PPI Glucose control:  SSI Yes Central venous access:  N/A (has port)  Arterial line:  N/A Foley:  N/A Mobility:  bed rest  PT consulted: N/A Last date of multidisciplinary goals of care discussion [pending] Code Status:  full code Disposition:  ICU   Labs   CBC: Recent Labs  Lab 10/10/20 1602 10/11/20 0247 10/12/20 0135 10/12/20 0537  WBC 11.8* 10.0  --  14.6*  HGB 12.6* 11.0* 10.9* 9.0*  HCT 38.3* 33.3* 32.0* 27.1*  MCV 96.5 98.2  --  96.8  PLT 166 136*  --  91*    Basic Metabolic Panel: Recent Labs  Lab 10/10/20 1602 10/11/20 0247 10/12/20 0135 10/12/20 0537  NA 143 143 145 142  K 3.4* 3.5 4.3 4.2  CL 106 116*  --  117*  CO2 24 19*  --  18*  GLUCOSE 120* 93  --  106*  BUN 50* 41*  --  37*  CREATININE 1.14 1.03  --  0.98  CALCIUM 10.0 8.7*  --  8.1*  MG 2.4  --   --   --    GFR: Estimated Creatinine Clearance: 62.9 mL/min (by C-G formula based on SCr of 0.98 mg/dL). Recent Labs  Lab 10/10/20 1600 10/10/20 1602 10/10/20 1825 10/11/20 0247 10/11/20 1141 10/11/20 1345 10/12/20 0537  WBC  --  11.8*  --  10.0  --   --  14.6*  LATICACIDVEN 2.6*  --  2.8*  --  2.7* 0.7  --     Liver Function Tests: Recent Labs  Lab 10/10/20 1602 10/11/20 0247 10/12/20 0537  AST 209* 127* 47*  ALT 117* 91* 59*  ALKPHOS  60 52 40  BILITOT 1.3* 1.3* 1.2  PROT 7.4 6.2* 5.3*  ALBUMIN 4.2 3.4* 2.7*   Recent Labs  Lab 10/10/20 1602  LIPASE 27   No results for input(s): AMMONIA in the last 168 hours.  ABG    Component Value Date/Time   PHART 7.352 10/12/2020 0135   PCO2ART 33.4 10/12/2020 0135   PO2ART 91 10/12/2020 0135   HCO3 18.4 (L) 10/12/2020 0135   TCO2 19 (L) 10/12/2020 0135   ACIDBASEDEF 6.0 (H) 10/12/2020 0135   O2SAT 96.0 10/12/2020 0135     Coagulation Profile: Recent Labs  Lab 10/10/20 1602  INR 1.2    Cardiac Enzymes: Recent Labs  Lab 10/10/20 1602 10/11/20 1141 10/12/20 0537  CKTOTAL 4,232* 1,873* 556*    HbA1C: Hgb A1c MFr Bld  Date/Time Value Ref Range Status  10/20/2013 01:12 PM 5.6 <5.7 % Final    Comment:  According to the ADA Clinical Practice Recommendations for 2011, when HbA1c is used as a screening test:     >=6.5%   Diagnostic of Diabetes Mellitus            (if abnormal result is confirmed)   5.7-6.4%   Increased risk of developing Diabetes Mellitus   References:Diagnosis and Classification of Diabetes Mellitus,Diabetes BOFB,5102,58(NIDPO 1):S62-S69 and Standards of Medical Care in         Diabetes - 2011,Diabetes EUMP,5361,44 (Suppl 1):S11-S61.      CBG: Recent Labs  Lab 10/11/20 1532 10/11/20 1927 10/11/20 1954 10/11/20 2340 10/12/20 0347  GLUCAP 77 65* 200* 93 78      Critical care time: 32 min     CRITICAL CARE Performed by: Leanna Sato. [place]   Patient critically ill due to respiratory failure, hip fracture, rhabdo Interventions to address this today IV fluids, weaning, tracheobronchial toilet Risk of deterioration without these interventions is high  I personally spent 32 minutes providing critical care not including any separately billable procedures   Kara Mead MD. FCCP. [facility] Pager : [phone]  If no response to  pager , please call [phone] until 7 pm After 7:00 pm call Elink  [phone]     10/12/2020, 7:51 AM

## 2020-10-12 NOTE — Transfer of Care (Signed)
Immediate Anesthesia Transfer of Care Note  Patient: [name]  Procedure(s) Performed: TOTALhemi  HIP ARTHROPLASTY ANTERIOR APPROACH (Left Hip)  Patient Location: ICU  Anesthesia Type:General  Level of Consciousness: sedated and Patient remains intubated per anesthesia plan  Airway & Oxygen Therapy: Patient remains intubated per anesthesia plan and Patient placed on Ventilator (see vital sign flow sheet for setting)  Post-op Assessment: Report given to RN and Post -op Vital signs reviewed and stable  Post vital signs: Reviewed and stable Pt transported with all gtts continued to ICU. PT VSS and remains intubated, sedated and ventilated to the icu. Last Vitals:  Vitals Value Taken Time  BP    Temp    Pulse    Resp    SpO2      Last Pain:  Vitals:   10/12/20 1117  TempSrc: Axillary         Complications: No complications documented.

## 2020-10-12 NOTE — Progress Notes (Signed)
CPT not done at this time as he is going to the OR

## 2020-10-12 NOTE — H&P (Signed)

## 2020-10-12 NOTE — Procedures (Signed)
Extubation Procedure Note  Patient Details:   Name: [name] DOB: [birth year] MRN: [id number]   Airway Documentation:    Vent end date: 10/12/20 Vent end time: 1558   Evaluation  O2 sats: stable throughout Complications: No apparent complications Patient did tolerate procedure well. Bilateral Breath Sounds: Diminished   Yes   Pt extubated to 3L [place] per MD order. Positive cuff leak noted prior to extubation. Pt with weak non-productive cough post extubation. Pt encouraged to try and do some deep breathing and deep coughing instead of just throat clearing  Jesse Sans 10/12/2020, 4:13 PM

## 2020-10-12 NOTE — Progress Notes (Signed)
[place] Progress Note Patient Name: ISIAC BREIGHNER DOB: [birth year] MRN: [id number]   Date of Service  10/12/2020  HPI/Events of Note  ABG on 60%/PRVC 18/TV 420/P 10 = 7.35/33.4/91/19  eICU Interventions  Continue present ventilator management.      Intervention Category Major Interventions: Respiratory failure - evaluation and management  Luzmaria Devaux Eugene 10/12/2020, 1:40 AM

## 2020-10-12 NOTE — Progress Notes (Signed)
Patient ID: [name], male   DOB: 12-12-1950, 70 y.o.   MRN: [id number] Patient intubated yesterday for respiratory failure set up for hip repair today.  Patient is mildly sedated but awakens briskly follows commands with symmetric strength bilaterally.  No neurosurgical intervention needed.

## 2020-10-13 ENCOUNTER — Inpatient Hospital Stay (HOSPITAL_COMMUNITY): Payer: Medicare HMO

## 2020-10-13 DIAGNOSIS — J9601 Acute respiratory failure with hypoxia: Secondary | ICD-10-CM | POA: Diagnosis not present

## 2020-10-13 DIAGNOSIS — M6282 Rhabdomyolysis: Secondary | ICD-10-CM

## 2020-10-13 LAB — CBC
HCT: 25 % — ABNORMAL LOW (ref 39.0–52.0)
Hemoglobin: 8.4 g/dL — ABNORMAL LOW (ref 13.0–17.0)
MCH: 32.3 pg (ref 26.0–34.0)
MCHC: 33.6 g/dL (ref 30.0–36.0)
MCV: 96.2 fL (ref 80.0–100.0)
Platelets: 77 10*3/uL — ABNORMAL LOW (ref 150–400)
RBC: 2.6 MIL/uL — ABNORMAL LOW (ref 4.22–5.81)
RDW: 18.6 % — ABNORMAL HIGH (ref 11.5–15.5)
WBC: 10.2 10*3/uL (ref 4.0–10.5)
nRBC: 0 % (ref 0.0–0.2)

## 2020-10-13 LAB — COMPREHENSIVE METABOLIC PANEL
ALT: 55 U/L — ABNORMAL HIGH (ref 0–44)
AST: 45 U/L — ABNORMAL HIGH (ref 15–41)
Albumin: 2.3 g/dL — ABNORMAL LOW (ref 3.5–5.0)
Alkaline Phosphatase: 42 U/L (ref 38–126)
Anion gap: 5 (ref 5–15)
BUN: 20 mg/dL (ref 8–23)
CO2: 20 mmol/L — ABNORMAL LOW (ref 22–32)
Calcium: 7.9 mg/dL — ABNORMAL LOW (ref 8.9–10.3)
Chloride: 110 mmol/L (ref 98–111)
Creatinine, Ser: 0.8 mg/dL (ref 0.61–1.24)
GFR, Estimated: >60 mL/min (ref >60)
Glucose, Bld: 133 mg/dL — ABNORMAL HIGH (ref 70–99)
Potassium: 3 mmol/L — ABNORMAL LOW (ref 3.5–5.1)
Sodium: 135 mmol/L (ref 135–145)
Total Bilirubin: 1 mg/dL (ref 0.3–1.2)
Total Protein: 5.1 g/dL — ABNORMAL LOW (ref 6.5–8.1)

## 2020-10-13 LAB — CBC WITH DIFFERENTIAL/PLATELET
Abs Immature Granulocytes: 0.19 10*3/uL — ABNORMAL HIGH (ref 0.00–0.07)
Basophils Absolute: 0 10*3/uL (ref 0.0–0.1)
Basophils Relative: 0 %
Eosinophils Absolute: 0.4 10*3/uL (ref 0.0–0.5)
Eosinophils Relative: 3 %
HCT: 27.9 % — ABNORMAL LOW (ref 39.0–52.0)
Hemoglobin: 9.3 g/dL — ABNORMAL LOW (ref 13.0–17.0)
Immature Granulocytes: 2 %
Lymphocytes Relative: 6 %
Lymphs Abs: 0.7 10*3/uL (ref 0.7–4.0)
MCH: 31.8 pg (ref 26.0–34.0)
MCHC: 33.3 g/dL (ref 30.0–36.0)
MCV: 95.5 fL (ref 80.0–100.0)
Monocytes Absolute: 0.9 10*3/uL (ref 0.1–1.0)
Monocytes Relative: 8 %
Neutro Abs: 8.9 10*3/uL — ABNORMAL HIGH (ref 1.7–7.7)
Neutrophils Relative %: 81 %
Platelets: 79 10*3/uL — ABNORMAL LOW (ref 150–400)
RBC: 2.92 MIL/uL — ABNORMAL LOW (ref 4.22–5.81)
RDW: 18.2 % — ABNORMAL HIGH (ref 11.5–15.5)
WBC: 11.1 10*3/uL — ABNORMAL HIGH (ref 4.0–10.5)
nRBC: 0 % (ref 0.0–0.2)

## 2020-10-13 LAB — CULTURE, RESPIRATORY W GRAM STAIN: Culture: NORMAL

## 2020-10-13 LAB — PHOSPHORUS: Phosphorus: 1.5 mg/dL — ABNORMAL LOW (ref 2.5–4.6)

## 2020-10-13 LAB — MAGNESIUM: Magnesium: 1.8 mg/dL (ref 1.7–2.4)

## 2020-10-13 MED ORDER — MIRTAZAPINE 30 MG PO TABS
30.0000 mg | ORAL_TABLET | Freq: Every day | ORAL | Status: DC
  Administered 2020-10-13 – 2020-10-14 (×2): 30 mg via ORAL
  Filled 2020-10-13 (×3): qty 1

## 2020-10-13 MED ORDER — HYDRALAZINE HCL 20 MG/ML IJ SOLN
10.0000 mg | Freq: Once | INTRAMUSCULAR | Status: AC
  Administered 2020-10-25: 10 mg via INTRAVENOUS
  Filled 2020-10-13: qty 1

## 2020-10-13 MED ORDER — OXYCODONE-ACETAMINOPHEN 5-325 MG PO TABS
1.0000 | ORAL_TABLET | Freq: Three times a day (TID) | ORAL | Status: DC | PRN
Start: 2020-10-13 — End: 2020-10-15
  Administered 2020-10-13 – 2020-10-14 (×2): 1 via ORAL
  Filled 2020-10-13 (×2): qty 1

## 2020-10-13 MED ORDER — ASPIRIN EC 81 MG PO TBEC
81.0000 mg | DELAYED_RELEASE_TABLET | Freq: Every day | ORAL | Status: DC
  Administered 2020-10-13 – 2020-10-14 (×2): 81 mg via ORAL
  Filled 2020-10-13 (×2): qty 1

## 2020-10-13 MED ORDER — ALPRAZOLAM 0.5 MG PO TABS
1.0000 mg | ORAL_TABLET | Freq: Three times a day (TID) | ORAL | Status: DC
  Administered 2020-10-13 – 2020-10-14 (×5): 1 mg via ORAL
  Filled 2020-10-13 (×5): qty 2

## 2020-10-13 MED ORDER — FERROUS GLUCONATE 324 (38 FE) MG PO TABS
324.0000 mg | ORAL_TABLET | Freq: Two times a day (BID) | ORAL | Status: DC
  Administered 2020-10-13 – 2020-10-14 (×3): 324 mg via ORAL
  Filled 2020-10-13 (×5): qty 1

## 2020-10-13 MED ORDER — NICOTINE 14 MG/24HR TD PT24
14.0000 mg | MEDICATED_PATCH | Freq: Every day | TRANSDERMAL | Status: DC
  Administered 2020-10-13 – 2020-11-10 (×29): 14 mg via TRANSDERMAL
  Filled 2020-10-13 (×29): qty 1

## 2020-10-13 MED ORDER — POTASSIUM PHOSPHATES 15 MMOLE/5ML IV SOLN
30.0000 mmol | Freq: Once | INTRAVENOUS | Status: AC
  Administered 2020-10-13: 30 mmol via INTRAVENOUS
  Filled 2020-10-13: qty 10

## 2020-10-13 NOTE — Evaluation (Signed)
Physical Therapy Evaluation Patient Details Name: [name] MRN: [id number] DOB: [birth year] Today's Date: 10/13/2020   History of Present Illness  70 yo admitted 3/30 after fall and found by brother with unknown down time (1-3 days). Fractured L frontal sinus,L L3 transverse process, displaced angulated mildly comminuted L femoral neck fx. AKI with rhabdo. 3/31 pt with hypoxia requiring intubation, extubated 4/1. Pt s/p left anterior hip hemiarthroplasty 4/1. 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 Pt not oriented to place or time with lack of recall and decreased STM, decreased strength, function and mobility, lack of caregiver at home as pt lives alone. Pt also reports additional fall in the last month [place] a [place]. Pt with above and below deficits who will benefit from acute therapy to maximize mobility, safety, function and independence to decrease burden of care.   Pt on 2L throughout SpO2 94%    Follow Up Recommendations SNF;Supervision/Assistance - 24 hour    Equipment Recommendations  Rolling walker with 5" wheels;3in1 (PT)    Recommendations for Other Services OT consult     Precautions / Restrictions Precautions Precautions: Fall Precaution Comments: VAC Restrictions Weight Bearing Restrictions: Yes LLE Weight Bearing: Weight bearing as tolerated      Mobility  Bed Mobility Overal bed mobility: Needs Assistance Bed Mobility: Supine to Sit     Supine to sit: Mod assist;+2 for physical assistance;HOB elevated     General bed mobility comments: physical assist to bring LLE off of bed, cues for hand placement and sequence to pivot to right side of bed and elevate trunk. Pt with left lean in sitting    Transfers Overall transfer level: Needs assistance   Transfers: Sit to/from Stand;Stand Pivot Transfers Sit to Stand: Min assist;+2 physical  assistance Stand pivot transfers: +2 physical assistance;Mod assist       General transfer comment: pt with cues for hand placement but not following commands with transfers. Pt with anterior bias in standing with cues and tactile assist not helping to correct even with bil UE on RW pt requires physical assist to maintain balance. Mod +2 with RW to pivot bed to chair with assist and max cues  Ambulation/Gait             General Gait Details: not yet able  Stairs            Wheelchair Mobility    Modified Rankin (Stroke Patients Only)       Balance Overall balance assessment: Needs assistance   Sitting balance-Leahy Scale: Poor Sitting balance - Comments: left bias Postural control: Left lateral lean Standing balance support: Bilateral upper extremity supported Standing balance-Leahy Scale: Zero Standing balance comment: anterior left bias in standing with bil UE support and requires additional physical support                             Pertinent Vitals/Pain Pain Assessment: 0-10 Pain Score: 3  Pain Location: sore left hip with movement Pain Descriptors / Indicators: Guarding Pain Intervention(s): Limited activity within patient's tolerance;Repositioned;Monitored during session    Home Living Family/patient expects to be discharged to:: Private residence Living Arrangements: Alone   Type of Home: Mobile home Home Access: Stairs to enter   Technical brewer of Steps: 2 Home Layout: One level Home Equipment: Cane - single point      Prior  Function Level of Independence: Independent         Comments: still driving     Hand Dominance        Extremity/Trunk Assessment   Upper Extremity Assessment Upper Extremity Assessment: Generalized weakness    Lower Extremity Assessment Lower Extremity Assessment: Generalized weakness    Cervical / Trunk Assessment Cervical / Trunk Assessment: Other exceptions Cervical / Trunk  Exceptions: pt with left lean in sitting and standing and extreme anterior bias in standing  Communication   Communication: No difficulties  Cognition Arousal/Alertness: Awake/alert Behavior During Therapy: Flat affect Overall Cognitive Status: Impaired/Different from baseline Area of Impairment: Orientation;Attention;Memory;Following commands;Safety/judgement                 Orientation Level: Disoriented to;Time;Place Current Attention Level: Focused Memory: Decreased short-term memory Following Commands: Follows one step commands inconsistently;Follows one step commands with increased time Safety/Judgement: Decreased awareness of safety;Decreased awareness of deficits     General Comments: pt not oriented to place or time even with cues and orientation during session      General Comments      Exercises General Exercises - Lower Extremity Long Arc Quad: AROM;Left;Seated Heel Slides: AAROM;Left;Supine;5 reps Hip Flexion/Marching: AAROM;Left;Seated;10 reps   Assessment/Plan    PT Assessment Patient needs continued PT services  PT Problem List Decreased strength;Decreased mobility;Decreased safety awareness;Decreased activity tolerance;Decreased cognition;Decreased balance;Decreased knowledge of use of DME;Cardiopulmonary status limiting activity;Decreased skin integrity;Decreased coordination       PT Treatment Interventions Gait training;Balance training;Functional mobility training;Therapeutic activities;Patient/family education;Cognitive remediation;Neuromuscular re-education;DME instruction;Therapeutic exercise    PT Goals (Current goals can be found in the Care Plan section)  Acute Rehab PT Goals Patient Stated Goal: be able to return home and go to chemo PT Goal Formulation: With patient Time For Goal Achievement: 10/27/20 Potential to Achieve Goals: Fair    Frequency Min 3X/week   Barriers to discharge Decreased caregiver support      Co-evaluation                AM-PAC PT "6 Clicks" Mobility  Outcome Measure Help needed turning from your back to your side while in a flat bed without using bedrails?: A Little Help needed moving from lying on your back to sitting on the side of a flat bed without using bedrails?: A Lot Help needed moving to and from a bed to a chair (including a wheelchair)?: Total Help needed standing up from a chair using your arms (e.g., wheelchair or bedside chair)?: A Lot Help needed to walk in hospital room?: Total Help needed climbing 3-5 steps with a railing? : Total 6 Click Score: 10    End of Session Equipment Utilized During Treatment: Gait belt;Oxygen Activity Tolerance: Patient tolerated treatment well Patient left: in chair;with call bell/phone within reach;with chair alarm set Nurse Communication: Mobility status;Weight bearing status PT Visit Diagnosis: Other abnormalities of gait and mobility (R26.89);Difficulty in walking, not elsewhere classified (R26.2);Muscle weakness (generalized) (M62.81)    Time: 2355-7322 PT Time Calculation (min) (ACUTE ONLY): 23 min   Charges:   PT Evaluation $PT Eval High Complexity: 1 High PT Treatments $Therapeutic Activity: 8-22 mins        Beronica Lansdale P, PT Acute Rehabilitation Services Pager: [phone] Office: [place] Khal[place] Albanese 10/13/2020, 9:19 AM

## 2020-10-13 NOTE — Progress Notes (Signed)
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 Found by brother 3/30 after he had been unable to reach pt-- unknown period of time down (1-3days suspected). Fractured L frontal sinus,L L3 transverse process, displaced angulated mildly comminuted L femoral neck fx. AKI with rhabdo.   On 3/31 pt worsening hypoxia and respiratory distress >> intubated  Pertinent  Medical History  CAD MI COPD Metastatic small call lung cancer Anxiety Tobacco use    Significant Hospital Events: Including procedures, antibiotic start and stop dates in addition to other pertinent events   . 3/30 admitted to [facility] for encephalopathy. Facial fx, L3 TP fx, Hip fx.  . 3/31 non op from [place] standpoint. Ortho planned to OR 3/31 however acute hypoxic respiratory failure, transferred to ICU and emergently intubated.    3/31 ETT >>4/1 331 bronchoscopy with BAL left lower lobe  4/1 LT  Hip Hemiarthroplasty (Anterior Approach)   Interim History / Subjective:   Extubated yesterday has done well. Denies pain Required in and out cath for urine retention overnight   Objective   Blood pressure (!) 139/118, pulse 88, temperature 98.5 F (36.9 C), temperature source Oral, resp. rate 15, height _0  (1.753 m), weight 65.8 kg, SpO2 93 %.    Vent Mode: CPAP;PSV FiO2 (%):  [40 %] 40 % Set Rate:  [18 bmp] 18 bmp Vt Set:  [420 mL] 420 mL PEEP:  [5 cmH20] 5 cmH20 Pressure Support:  [8 cmH20] 8 cmH20   Intake/Output Summary (Last 24 hours) at 10/13/2020 0915 Last data filed at 10/13/2020 0600 Gross per 24 hour  Intake 3889.44 ml  Output 900 ml  Net 2989.44 ml   Filed Weights    10/10/20 1538 10/12/20 0434 10/13/20 0500  Weight: 66.7 kg 62.5 kg 65.8 kg    Examination: General: Chronically  ill appearing thin adult M, appears older than stated age.   HENT: Temporal muscle wasting. L frontal/periorbital ecchymosis and edema.  Left subconjunctival hemorrhage Lungs: No accessory muscle use, no rhonchi, decreased breath sounds bilateral Cardiovascular: S1-S2 regular, no murmur Abdomen: soft flat ndnt  Extremities: Clubbing. No pitting edema. No cyanosis Neuro: Alert, interactive, nonfocal GU: Urine in Foley bag   Chest x-ray 4/1 independently reviewed shows left retrocardiac opacity stable. Labs show hypokalemia and hypophosphatemia, no leukocytosis, stable anemia , platelets continue to drop to 77K  Labs/imaging that I havepersonally reviewed  (right click and "Reselect all SmartList Selections" daily)    3/30 CT head/chest abdomen pelvis with L frontal sinus fx, L3 left transverse process fx, L femoral neck fx , multiple nodules stable from CT February, aspiration debris and bronchi 3/31 BAL culture >> ng  Resolved Hospital Problem list     Assessment & Plan:   Acute encephalopathy , resolved -hypoxia v ?concussion r/t fall, metabolic component  Chronic pain - home percocet and gabapantin Hx anxiety - home remeron, atarax, xanax  P -dc mRI brain -resume home gabapentin  -Resume home Xanax half dose to prevent withdrawal    Acute hypoxic respiratory failure Hx COPD Bronchoscopy 3/30 thick secretions suctioned out Aspiration pna P -Hold off SBT/W UA until after surgery today -Tracheobronchial toilet -chest  PT - ct cefepime x 5-7 ds -PRN BDs  Metastatic small cell carcinoma -lungs, liver  -follows with Dr Delton Coombes, is on 13th cycle of chemo (aloxi) -Head CT has not shown brain mets  L femoral neck Fx status post hemiarthroplasty -ortho following , okay to mobilize   L facial fx L3 TP fx -NSGY evaluated, no surgical intervention  indicated   Oliguria Rhabdomyolysis -resolved CK decreased to 556 from 4k -Decrease IV fluids to 50/hour -Okay to I/O cath again per protocol, add Urecholine   CAD  -resume home ASA   Thrombocytopenia-rapid drop from 1 66-77K , doubt HIT -Monitor  Tobacco abuse disorder  -cessation counseling when able  Can transfer to telemetry and to triad 4/3  Labs   CBC: Recent Labs  Lab 10/10/20 1602 10/11/20 0247 10/12/20 0135 10/12/20 0537 10/13/20 0451  WBC 11.8* 10.0  --  14.6* 10.2  HGB 12.6* 11.0* 10.9* 9.0* 8.4*  HCT 38.3* 33.3* 32.0* 27.1* 25.0*  MCV 96.5 98.2  --  96.8 96.2  PLT 166 136*  --  91* 77*    Basic Metabolic Panel: Recent Labs  Lab 10/10/20 1602 10/11/20 0247 10/12/20 0135 10/12/20 0537 10/13/20 0451  NA 143 143 145 142 135  K 3.4* 3.5 4.3 4.2 3.0*  CL 106 116*  --  117* 110  CO2 24 19*  --  18* 20*  GLUCOSE 120* 93  --  106* 133*  BUN 50* 41*  --  37* 20  CREATININE 1.14 1.03  --  0.98 0.80  CALCIUM 10.0 8.7*  --  8.1* 7.9*  MG 2.4  --   --   --  1.8  PHOS  --   --   --   --  1.5*   GFR: Estimated Creatinine Clearance: 81.1 mL/min (by C-G formula based on SCr of 0.8 mg/dL). Recent Labs  Lab 10/10/20 1600 10/10/20 1602 10/10/20 1825 10/11/20 0247 10/11/20 1141 10/11/20 1345 10/12/20 0537 10/13/20 0451  WBC  --  11.8*  --  10.0  --   --  14.6* 10.2  LATICACIDVEN 2.6*  --  2.8*  --  2.7* 0.7  --   --     Liver Function Tests: Recent Labs  Lab 10/10/20 1602 10/11/20 0247 10/12/20 0537 10/13/20 0451  AST 209* 127* 47* 45*  ALT 117* 91* 59* 55*  ALKPHOS 60 52 40 42  BILITOT 1.3* 1.3* 1.2 1.0  PROT 7.4 6.2* 5.3* 5.1*  ALBUMIN 4.2 3.4* 2.7* 2.3*   Recent Labs  Lab 10/10/20 1602  LIPASE 27   No results for input(s): AMMONIA in the last 168 hours.  ABG    Component Value Date/Time   PHART 7.352 10/12/2020 0135   PCO2ART 33.4 10/12/2020 0135   PO2ART 91 10/12/2020 0135   HCO3 18.4 (L) 10/12/2020 0135   TCO2 19 (L)  10/12/2020 0135   ACIDBASEDEF 6.0 (H) 10/12/2020 0135   O2SAT 96.0 10/12/2020 0135     Coagulation Profile: Recent Labs  Lab 10/10/20 1602  INR 1.2    Cardiac Enzymes: Recent Labs  Lab 10/10/20 1602 10/11/20 1141 10/12/20 0537  CKTOTAL 4,232* 1,873* 556*    HbA1C: Hgb A1c MFr Bld  Date/Time Value Ref Range Status  10/20/2013 01:12 PM 5.6 <5.7 % Final    Comment:  According to the ADA Clinical Practice Recommendations for 2011, when HbA1c is used as a screening test:     >=6.5%   Diagnostic of Diabetes Mellitus            (if abnormal result is confirmed)   5.7-6.4%   Increased risk of developing Diabetes Mellitus   References:Diagnosis and Classification of Diabetes Mellitus,Diabetes RRNH,6579,03(YBFXO 1):S62-S69 and Standards of Medical Care in         Diabetes - 2011,Diabetes VANV,9166,06 (Suppl 1):S11-S61.      CBG: Recent Labs  Lab 10/11/20 2340 10/12/20 0347 10/12/20 0752 10/12/20 1123 10/12/20 2335  GLUCAP 93 78 117* 127* 131*       Kara Mead MD. FCCP. [facility] Pager : [phone]  If no response to pager , please call [phone] until 7 pm After 7:00 pm call Elink  [phone]     10/13/2020, 9:15 AM

## 2020-10-13 NOTE — Progress Notes (Signed)
[facility] Progress Note Patient Name: [name] DOB: [birth year] MRN: [id number]   Date of Service  10/13/2020  HPI/Events of Note  SBP > 160, hydralazine order is off.   eICU Interventions  Cr normal.  Hydralazine 10 mg IV once ordered. Call back if not better     Intervention Category Intermediate Interventions: Hypertension - evaluation and management  Elmer Sow 10/13/2020, 11:21 PM

## 2020-10-13 NOTE — Progress Notes (Signed)
     Subjective: 1 Day Post-Op Procedure(s) (LRB): TOTALhemi  HIP ARTHROPLASTY ANTERIOR APPROACH (Left) ICU, up to bedside recliner this AM. Pain is improved post left hip hemiarthroplasty. VAC left anterior hip to closed suction Preveena. Foley discontinued and has sensation of bladder fullness though voiding in large quanities per nursing. Will check post void residual.   Patient reports pain as moderate.    Objective:   VITALS:  Temp:  [97.6 F (36.4 C)-98.6 F (37 C)] 98.5 F (36.9 C) (04/02 0700) Pulse Rate:  [48-88] 88 (04/02 0600) Resp:  [8-30] 15 (04/02 0600) BP: (100-159)/(42-118) 139/118 (04/02 0600) SpO2:  [87 %-100 %] 93 % (04/02 0600) FiO2 (%):  [40 %] 40 % (04/02 0400) Weight:  [65.8 kg] 65.8 kg (04/02 0500)  Neurologically intact ABD soft Neurovascular intact Sensation intact distally Intact pulses distally Dorsiflexion/Plantar flexion intact Incision: dressing C/D/I and no drainage   LABS Recent Labs    10/10/20 1602 10/11/20 0247 10/12/20 0135 10/12/20 0537 10/13/20 0451  HGB 12.6* 11.0* 10.9* 9.0* 8.4*  WBC 11.8* 10.0  --  14.6* 10.2  PLT 166 136*  --  91* 77*   Recent Labs    10/12/20 0537 10/13/20 0451  NA 142 135  K 4.2 3.0*  CL 117* 110  CO2 18* 20*  BUN 37* 20  CREATININE 0.98 0.80  GLUCOSE 106* 133*   Recent Labs    10/10/20 1602  INR 1.2     Assessment/Plan: 1 Day Post-Op Procedure(s) (LRB): TOTALhemi  HIP ARTHROPLASTY ANTERIOR APPROACH (Left) Anemia due to blood loss and ?chronic disease. Low potassium.  Advance diet Up with therapy Discharge to SNF  [name] 10/13/2020, 11:06 AMPatient ID: [name], male   DOB: 04-08-1951, 70 y.o.   MRN: [id number]

## 2020-10-13 NOTE — Progress Notes (Signed)
[facility] Progress Note Patient Name: [name] DOB: [birth year] MRN: [id number]   Date of Service  10/13/2020  HPI/Events of Note  RN said pt is extubated, his bladder scan is > 976ml. He is about to I/O cath patient. He does not want a Foley because pt is extubated, but he is not urinating. Need order for I/O cath. He had one  (I/O cath) last night, one on day shift and this will be the third one.   eICU Interventions  Straight cath for I/O ordered once Prn bladder scan      Intervention Category Intermediate Interventions: Other:  Elmer Sow 10/13/2020, 11:33 PM

## 2020-10-13 NOTE — Progress Notes (Signed)
[facility] Progress Note Patient Name: [name] DOB: September 17, 1950 MRN: [id number]   Date of Service  10/13/2020  HPI/Events of Note  Asking for nicotine patch  eICU Interventions  ordered     Intervention Category Minor Interventions: Other:  Elmer Sow 10/13/2020, 9:01 PM

## 2020-10-14 DIAGNOSIS — S72002A Fracture of unspecified part of neck of left femur, initial encounter for closed fracture: Secondary | ICD-10-CM | POA: Diagnosis not present

## 2020-10-14 LAB — CBC
HCT: 26.6 % — ABNORMAL LOW (ref 39.0–52.0)
Hemoglobin: 9.3 g/dL — ABNORMAL LOW (ref 13.0–17.0)
MCH: 32.3 pg (ref 26.0–34.0)
MCHC: 35 g/dL (ref 30.0–36.0)
MCV: 92.4 fL (ref 80.0–100.0)
Platelets: 88 10*3/uL — ABNORMAL LOW (ref 150–400)
RBC: 2.88 MIL/uL — ABNORMAL LOW (ref 4.22–5.81)
RDW: 18.1 % — ABNORMAL HIGH (ref 11.5–15.5)
WBC: 8.8 10*3/uL (ref 4.0–10.5)
nRBC: 0 % (ref 0.0–0.2)

## 2020-10-14 LAB — COMPREHENSIVE METABOLIC PANEL
ALT: 52 U/L — ABNORMAL HIGH (ref 0–44)
AST: 44 U/L — ABNORMAL HIGH (ref 15–41)
Albumin: 2.3 g/dL — ABNORMAL LOW (ref 3.5–5.0)
Alkaline Phosphatase: 76 U/L (ref 38–126)
Anion gap: 6 (ref 5–15)
BUN: 14 mg/dL (ref 8–23)
CO2: 23 mmol/L (ref 22–32)
Calcium: 8.3 mg/dL — ABNORMAL LOW (ref 8.9–10.3)
Chloride: 112 mmol/L — ABNORMAL HIGH (ref 98–111)
Creatinine, Ser: 0.9 mg/dL (ref 0.61–1.24)
GFR, Estimated: >60 mL/min (ref >60)
Glucose, Bld: 131 mg/dL — ABNORMAL HIGH (ref 70–99)
Potassium: 3 mmol/L — ABNORMAL LOW (ref 3.5–5.1)
Sodium: 141 mmol/L (ref 135–145)
Total Bilirubin: 1.2 mg/dL (ref 0.3–1.2)
Total Protein: 5.2 g/dL — ABNORMAL LOW (ref 6.5–8.1)

## 2020-10-14 MED ORDER — POTASSIUM CHLORIDE CRYS ER 20 MEQ PO TBCR
40.0000 meq | EXTENDED_RELEASE_TABLET | Freq: Two times a day (BID) | ORAL | Status: AC
  Administered 2020-10-14 (×2): 40 meq via ORAL
  Filled 2020-10-14 (×2): qty 2

## 2020-10-14 NOTE — Social Work (Signed)
CSW was unable to reach patients brother Levada Dy to discuss possible SNF placement for patient. CSW left voicemail and awaiting callback. CSW will continue to follow and assist with discharge planning needs.

## 2020-10-14 NOTE — Progress Notes (Addendum)
Pharmacy Antibiotic Note  [name] is a 70 y.o. male admitted on 10/10/2020 with sepsis.  Pharmacy has been consulted for vancomycin and cefepime dosing, now only on cefepime for possible aspiration pneumona.  Patient had worsening hypoxia and respiratory distress on 3/31 requiring transfer into the ICU and intubation.   Extubated on 4/1 and transferred to [facility] on 4/3, currently on room air.  SCr 0.90, afebrile, WBC normalized. Per CCM, plan for 5-7 duration of therapy, today is day 5.  Plan: Cefepime 2gm IV Q8H Consider stopping antibiotics after today Monitor renal fxn, clinical progress  Height: _0  (175.3 cm) Weight: 68.7 kg (151 lb 7.3 oz) IBW/kg (Calculated) : 70.7  Temp (24hrs), Avg:97.9 F (36.6 C), Min:97.5 F (36.4 C), Max:98.1 F (36.7 C)  Recent Labs  Lab 10/10/20 1600 10/10/20 1602 10/10/20 1602 10/10/20 1825 10/11/20 0247 10/11/20 1141 10/11/20 1345 10/12/20 0537 10/13/20 0451 10/13/20 1605 10/14/20 0339  WBC  --  11.8*   < >  --  10.0  --   --  14.6* 10.2 11.1* 8.8  CREATININE  --  1.14  --   --  1.03  --   --  0.98 0.80  --  0.90  LATICACIDVEN 2.6*  --   --  2.8*  --  2.7* 0.7  --   --   --   --    < > = values in this interval not displayed.    Estimated Creatinine Clearance: 75.3 mL/min (by C-G formula based on SCr of 0.9 mg/dL).    Allergies  Allergen Reactions  . Codeine Nausea Only  . Niaspan [Niacin Er]     Vanc 3/30 >> 4/1 Cefepime 3/30 >>  3/31 MRSA PCR - negative 3/31 BCx - ngtd 3/31 UCx - no growth 3/31 BAL - normal flora  Fara Olden, PharmD PGY-1 Pharmacy Resident 10/14/2020 9:11 AM Please see AMION for all pharmacy numbers

## 2020-10-14 NOTE — Evaluation (Signed)
Occupational Therapy Evaluation Patient Details Name: [name] MRN: [id number] DOB: 1951-01-09 Today's Date: 10/14/2020    History of Present Illness 70 yo admitted 3/30 after fall and found by brother with unknown down time (1-3 days). Fractured L frontal sinus,L L3 transverse process, displaced angulated mildly comminuted L femoral neck fx. AKI with rhabdo. 3/31 pt with hypoxia requiring intubation, extubated 4/1. Pt s/p left anterior hip hemiarthroplasty 4/1. 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 He currently is confused and needs Mod-total A for all basic ADLs and Max A for up to EOB and back into bed. He will continue to benefit from acute OT with follow up at SNF.    Follow Up Recommendations  SNF;Supervision/Assistance - 24 hour    Equipment Recommendations  Other (comment) (TBD next venue)       Precautions / Restrictions Precautions Precautions: Fall Precaution Comments: VAC Restrictions Weight Bearing Restrictions: Yes LLE Weight Bearing: Weight bearing as tolerated      Mobility Bed Mobility Overal bed mobility: Needs Assistance Bed Mobility: Supine to Sit;Sit to Supine     Supine to sit: Max assist;HOB elevated Sit to supine: Max assist           Balance Overall balance assessment: Needs assistance Sitting-balance support: Feet supported;Bilateral upper extremity supported Sitting balance-Leahy Scale: Zero Sitting balance - Comments: tends to lean left and posteriorly today                                   ADL either performed or assessed with clinical judgement   ADL Overall ADL's : Needs assistance/impaired Eating/Feeding: Moderate assistance;Bed level   Grooming: Maximal assistance;Bed level   Upper Body Bathing: Maximal  assistance;Bed level   Lower Body Bathing: Total assistance;Bed level   Upper Body Dressing : Total assistance;Bed level   Lower Body Dressing: Total assistance;Bed level                       Vision Patient Visual Report: No change from baseline              Pertinent Vitals/Pain Pain Assessment: Faces Faces Pain Scale: Hurts little more Pain Location: sore left hip with movement Pain Descriptors / Indicators: Guarding;Grimacing Pain Intervention(s): Limited activity within patient's tolerance;Monitored during session;Repositioned     Hand Dominance Right   Extremity/Trunk Assessment Upper Extremity Assessment Upper Extremity Assessment: Generalized weakness           Communication Communication Communication: No difficulties   Cognition Arousal/Alertness: Awake/alert Behavior During Therapy: Flat affect Overall Cognitive Status: Impaired/Different from baseline Area of Impairment: Orientation;Memory;Following commands;Safety/judgement;Problem solving                 Orientation Level: Time;Place;Situation;Disoriented to   Memory: Decreased short-term memory Following Commands: Follows one step commands inconsistently;Follows one step commands with increased time Safety/Judgement: Decreased awareness of safety;Decreased awareness of deficits   Problem Solving: Slow processing;Decreased initiation;Difficulty sequencing;Requires tactile cues;Requires verbal cues                Home Living Family/patient expects to be discharged to:: Skilled nursing facility Living Arrangements: Alone   Type of Home: Mobile home Home Access: Stairs to  enter Entrance Stairs-Number of Steps: 2   Home Layout: One level     Bathroom Shower/Tub: Occupational psychologist: Standard     Home Equipment: Cane - single point          Prior Functioning/Environment Level of Independence: Independent        Comments: still driving        OT  Problem List: Decreased strength;Decreased range of motion;Decreased activity tolerance;Impaired balance (sitting and/or standing);Decreased safety awareness;Decreased cognition;Pain      OT Treatment/Interventions: Self-care/ADL training;DME and/or AE instruction;Patient/family education;Balance training;Cognitive remediation/compensation    OT Goals(Current goals can be found in the care plan section) Acute Rehab OT Goals Patient Stated Goal: did not state OT Goal Formulation: With patient Time For Goal Achievement: 10/28/20 Potential to Achieve Goals: Good  OT Frequency: Min 2X/week   Barriers to D/C: Decreased caregiver support             AM-PAC OT "6 Clicks" Daily Activity     Outcome Measure Help from another person eating meals?: A Lot Help from another person taking care of personal grooming?: A Lot Help from another person toileting, which includes using toliet, bedpan, or urinal?: Total Help from another person bathing (including washing, rinsing, drying)?: A Lot Help from another person to put on and taking off regular upper body clothing?: A Lot Help from another person to put on and taking off regular lower body clothing?: Total 6 Click Score: 10   End of Session    Activity Tolerance: Patient limited by fatigue Patient left: in bed;with call bell/phone within reach;with bed alarm set  OT Visit Diagnosis: Other abnormalities of gait and mobility (R26.89);Muscle weakness (generalized) (M62.81);Pain;Other symptoms and signs involving cognitive function Pain - Right/Left: Left Pain - part of body: Leg                Time: 1657-9038 OT Time Calculation (min): 21 min Charges:  OT General Charges $OT Visit: 1 Visit OT Evaluation $OT Eval Moderate Complexity: 1 Mod  [place] Circle, OTR/L Acute [employer] Pager [phone] Office [phone]     Almon Register 10/14/2020, 2:47 PM

## 2020-10-14 NOTE — Progress Notes (Addendum)
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 Found by brother 10/10/20 after he had been unable to reach pt-- unknown period of time down (1-3days suspected). Fractured L frontal sinus, L L3 transverse process, displaced angulated mildly comminuted L femoral neck fx. AKI with rhabdo.  On 3/31 pt worsening hypoxia and respiratory distress >> intubated emergently.  Extubated on 10/12/2020, has done well.  10/12/2020 he had left hip hemiarthroplasty, anterior approach.  Hospital course complicated by recurrent acute urinary retention.  Had in and out cath for urine retention.  Foley catheter placed on 10/14/2020, with plan for outpatient voiding trial at the urology clinic in 7 days.  Care transferred to [facility] on 10/14/2020.  10/14/20: Patient was seen and examined at his bedside.  States his cough is improved.  He denies any chest pain or shortness of breath while at rest.  O2 saturation on room air 96%.  He is on IV cefepime for aspiration pneumonia.   Assessment/Plan: Active Problems:   Small cell lung cancer in adult ([place])   Pressure injury of skin   Closed fracture of left hip (HCC)  Resolved acute encephalopathy, likely multifactorial secondary to hypoxia versus concussion from the fall versus metabolic component versus polypharmacy with chronic opiate and benzodiazepine use He is currently alert and oriented x3. Reorient as needed. Fall and aspiration precautions. Add delirium precautions if needed.  Aspiration pneumonia post bronchoscopy on 10/10/2020, thick secretions suctioned out. Continue IV cefepime x5 to 7 days as recommended by PCCM. Monitor fever curve and WBC.  Acute hypoxic respiratory failure secondary to above Hypoxemia is  improving Not on oxygen supplementation at baseline Currently on room air with O2 saturation of 98%.  Sinus tachycardia Obtain twelve-lead EKG and TSH Continue home Xanax to avoid benzodiazepine withdrawal Continue gentle IV fluid hydration.  Hypokalemia Serum potassium 3.0 Repleted orally with 40 mEq x 2 doses. Last magnesium 1.8 on 10/13/2020 Repeat BMP and magnesium level in the morning.  Left hip fracture post fall at home status post left hemiarthroplasty on 10/12/2020 by Dr. Erlinda Hong Pain control/bowel regimen. Fall precautions PT OT recommended SNF.  Elevated liver chemistries Elevated AST and ALT, downtrending Repeat CMP in the morning.  Resolving rhabdomyolysis Presented with CPK greater than 4200 after being found on the floor for unknown amount of time, possibly 1 to 3 days. CPK is downtrending 556 on 10/12/2020 Repeat CPK in the morning Continue IV fluid hydration  Chronic normocytic anemia Baseline hemoglobin appears to be 10 Last hemoglobin 9.3 with MCV of 92. No overt bleeding. Continue home ferrous sulfate.  Chronic anxiety Continue home Xanax to avoid benzodiazepine withdrawal  Chronic pain syndrome Resume home regimen at lower doses.  Hyperlipidemia Lipitor is on hold due to elevated liver chemistries.  Physical debility PT OT recommended SNF. TOC consulted to assist with SNF placement.   Code Status: Full code.  Family Communication: None at bedside.  Disposition Plan: Likely will discharge to SNF   Consultants:  PCCM.  Procedures:  Intubation  Extubation   Antimicrobials:  Cefepime.  DVT prophylaxis: SCDs.  Status is: Inpatient   Dispo: The patient is from: Home.              Anticipated d/c is to: SNF.  Patient currently not stable for discharge due to ongoing treatment for aspiration pneumonia.   Difficult to place patient: Not applicable.        Objective: Vitals:   10/14/20 0612 10/14/20 0700 10/14/20  0900 10/14/20 1005  BP:  (!) 164/81 140/73 125/80  Pulse:  (!) 109 (!) 115 (!) 111  Resp:  _0 Temp:  97.8 F (36.6 C)  97.8 F (36.6 C)  TempSrc:  Oral  Oral  SpO2:  96% 98% 98%  Weight: 68.7 kg     Height:        Intake/Output Summary (Last 24 hours) at 10/14/2020 1442 Last data filed at 10/13/2020 2340 Gross per 24 hour  Intake 1049.53 ml  Output 1180 ml  Net -130.47 ml   Filed Weights   10/12/20 0434 10/13/20 0500 10/14/20 0612  Weight: 62.5 kg 65.8 kg 68.7 kg    Exam:  . General: 70 y.o. year-old male well developed well nourished in no acute distress.  Alert and oriented x3. . Cardiovascular: Regular rate and rhythm with no rubs or gallops.  No thyromegaly or JVD noted.   Marland Kitchen Respiratory: Very faint mild at bases.  No wheezing noted.  Poor inspiratory effort. . Abdomen: Soft nontender nondistended with normal bowel sounds x4 quadrants. . Musculoskeletal: No lower extremity edema. 2/4 pulses in all 4 extremities. . Skin: No ulcerative lesions noted or rashes . Psychiatry: Mood is appropriate for condition and setting   Data Reviewed: CBC: Recent Labs  Lab 10/11/20 0247 10/12/20 0135 10/12/20 0537 10/13/20 0451 10/13/20 1605 10/14/20 0339  WBC 10.0  --  14.6* 10.2 11.1* 8.8  NEUTROABS  --   --   --   --  8.9*  --   HGB 11.0* 10.9* 9.0* 8.4* 9.3* 9.3*  HCT 33.3* 32.0* 27.1* 25.0* 27.9* 26.6*  MCV 98.2  --  96.8 96.2 95.5 92.4  PLT 136*  --  91* 77* 79* 88*   Basic Metabolic Panel: Recent Labs  Lab 10/10/20 1602 10/11/20 0247 10/12/20 0135 10/12/20 0537 10/13/20 0451 10/14/20 0339  NA 143 143 145 142 135 141  K 3.4* 3.5 4.3 4.2 3.0* 3.0*  CL 106 116*  --  117* 110 112*  CO2 24 19*  --  18* 20* 23  GLUCOSE 120* 93  --  106* 133* 131*  BUN 50* 41*  --  37* 20 14  CREATININE 1.14 1.03  --  0.98 0.80 0.90  CALCIUM 10.0 8.7*  --  8.1* 7.9* 8.3*  MG 2.4  --   --   --  1.8  --   PHOS  --   --   --   --  1.5*  --    GFR: Estimated Creatinine  Clearance: 75.3 mL/min (by C-G formula based on SCr of 0.9 mg/dL). Liver Function Tests: Recent Labs  Lab 10/10/20 1602 10/11/20 0247 10/12/20 0537 10/13/20 0451 10/14/20 0339  AST 209* 127* 47* 45* 44*  ALT 117* 91* 59* 55* 52*  ALKPHOS 60 52 40 42 76  BILITOT 1.3* 1.3* 1.2 1.0 1.2  PROT 7.4 6.2* 5.3* 5.1* 5.2*  ALBUMIN 4.2 3.4* 2.7* 2.3* 2.3*   Recent Labs  Lab 10/10/20 1602  LIPASE 27   No results for input(s): AMMONIA in the last 168 hours. Coagulation Profile: Recent Labs  Lab 10/10/20 1602  INR 1.2   Cardiac Enzymes: Recent Labs  Lab 10/10/20 1602 10/11/20 1141 10/12/20 0537  CKTOTAL 4,232* 1,873* 556*   BNP (  last 3 results) No results for input(s): PROBNP in the last 8760 hours. HbA1C: No results for input(s): HGBA1C in the last 72 hours. CBG: Recent Labs  Lab 10/11/20 2340 10/12/20 0347 10/12/20 0752 10/12/20 1123 10/12/20 2335  GLUCAP 93 78 117* 127* 131*   Lipid Profile: No results for input(s): CHOL, HDL, LDLCALC, TRIG, CHOLHDL, LDLDIRECT in the last 72 hours. Thyroid Function Tests: No results for input(s): TSH, T4TOTAL, FREET4, T3FREE, THYROIDAB in the last 72 hours. Anemia Panel: No results for input(s): VITAMINB12, FOLATE, FERRITIN, TIBC, IRON, RETICCTPCT in the last 72 hours. Urine analysis:    Component Value Date/Time   COLORURINE AMBER (A) 10/10/2020 1610   APPEARANCEUR HAZY (A) 10/10/2020 1610   LABSPEC 1.029 10/10/2020 1610   PHURINE 5.0 10/10/2020 1610   GLUCOSEU NEGATIVE 10/10/2020 1610   HGBUR LARGE (A) 10/10/2020 1610   BILIRUBINUR NEGATIVE 10/10/2020 1610   KETONESUR 20 (A) 10/10/2020 1610   PROTEINUR 100 (A) 10/10/2020 1610   NITRITE NEGATIVE 10/10/2020 1610   LEUKOCYTESUR NEGATIVE 10/10/2020 1610   Sepsis Labs: _0 (procalcitonin:4,lacticidven:4)  ) Recent Results (from the past 240 hour(s))  Resp Panel by RT-PCR (Flu A&B, Covid) Nasopharyngeal Swab     Status: None   Collection Time: 10/10/20  6:25 PM    Specimen: Nasopharyngeal Swab; Nasopharyngeal(NP) swabs in vial transport medium  Result Value Ref Range Status   SARS Coronavirus 2 by RT PCR NEGATIVE NEGATIVE Final    Comment: (NOTE) SARS-CoV-2 target nucleic acids are NOT DETECTED.  The SARS-CoV-2 RNA is generally detectable in upper respiratory specimens during the acute phase of infection. The lowest concentration of SARS-CoV-2 viral copies this assay can detect is 138 copies/mL. A negative result does not preclude SARS-Cov-2 infection and should not be used as the sole basis for treatment or other patient management decisions. A negative result may occur with  improper specimen collection/handling, submission of specimen other than nasopharyngeal swab, presence of viral mutation(s) within the areas targeted by this assay, and inadequate number of viral copies(<138 copies/mL). A negative result must be combined with clinical observations, patient history, and epidemiological information. The expected result is Negative.  Fact Sheet for Patients:  EntrepreneurPulse.com.au  Fact Sheet for Healthcare Providers:  IncredibleEmployment.be  This test is no t yet approved or cleared by the [place] FDA and  has been authorized for detection and/or diagnosis of SARS-CoV-2 by FDA under an Emergency Use Authorization (EUA). This EUA will remain  in effect (meaning this test can be used) for the duration of the COVID-19 declaration under Section 564(b)(1) of the Act, 21 U.S.C.section 360bbb-3(b)(1), unless the authorization is terminated  or revoked sooner.       Influenza A by PCR NEGATIVE NEGATIVE Final   Influenza B by PCR NEGATIVE NEGATIVE Final    Comment: (NOTE) The Xpert Xpress SARS-CoV-2/FLU/RSV plus assay is intended as an aid in the diagnosis of influenza from Nasopharyngeal swab specimens and should not be used as a sole basis for treatment. Nasal washings and aspirates are  unacceptable for Xpert Xpress SARS-CoV-2/FLU/RSV testing.  Fact Sheet for Patients: EntrepreneurPulse.com.au  Fact Sheet for Healthcare Providers: IncredibleEmployment.be  This test is not yet approved or cleared by the [place] FDA and has been authorized for detection and/or diagnosis of SARS-CoV-2 by FDA under an Emergency Use Authorization (EUA). This EUA will remain in effect (meaning this test can be used) for the duration of the COVID-19 declaration under Section 564(b)(1) of the Act, 21 U.S.C. section 360bbb-3(b)(1), unless the authorization  is terminated or revoked.  Performed at [facility], [place] [address] [place], [place] [address]   Culture, blood (routine x 2)     Status: None (Preliminary result)   Collection Time: 10/11/20 10:53 AM   Specimen: BLOOD  Result Value Ref Range Status   Specimen Description BLOOD RIGHT ANTECUBITAL  Final   Special Requests   Final    BOTTLES DRAWN AEROBIC AND ANAEROBIC Blood Culture adequate volume   Culture   Final    NO GROWTH 3 DAYS Performed at [facility], [place] [address] [place], [place] [address]    Report Status PENDING  Incomplete  Culture, blood (routine x 2)     Status: None (Preliminary result)   Collection Time: 10/11/20 11:00 AM   Specimen: BLOOD RIGHT HAND  Result Value Ref Range Status   Specimen Description BLOOD RIGHT HAND  Final   Special Requests   Final    BOTTLES DRAWN AEROBIC ONLY Blood Culture adequate volume   Culture   Final    NO GROWTH 3 DAYS Performed at [facility], [place] [address], [place], [place] [address]    Report Status PENDING  Incomplete  Culture, Urine     Status: None   Collection Time: 10/11/20 11:04 AM   Specimen: Urine, Random  Result Value Ref Range Status   Specimen Description URINE, RANDOM  Final   Special Requests NONE  Final   Culture   Final    NO GROWTH Performed at [facility], [place] [address]  [place], [place] [address]    Report Status 10/12/2020 FINAL  Final  MRSA PCR Screening     Status: None   Collection Time: 10/11/20 12:46 PM   Specimen: Nasopharyngeal  Result Value Ref Range Status   MRSA by PCR NEGATIVE NEGATIVE Final    Comment:        The GeneXpert MRSA Assay (FDA approved for NASAL specimens only), is one component of a comprehensive MRSA colonization surveillance program. It is not intended to diagnose MRSA infection nor to guide or monitor treatment for MRSA infections. Performed at [facility], [place] [address], [place], [place] [address]   Culture, Respiratory w Gram Stain     Status: None   Collection Time: 10/11/20  1:02 PM   Specimen: Bronchoalveolar Lavage; Respiratory  Result Value Ref Range Status   Specimen Description BRONCHIAL ALVEOLAR LAVAGE  Final   Special Requests NONE  Final   Gram Stain   Final    ABUNDANT WBC PRESENT, PREDOMINANTLY PMN NO ORGANISMS SEEN    Culture   Final    FEW Normal respiratory flora-no Staph aureus or Pseudomonas seen Performed at [facility], [address] [place], [place] [address]    Report Status 10/13/2020 FINAL  Final  Culture, blood (Routine X 2) w Reflex to ID Panel     Status: None (Preliminary result)   Collection Time: 10/11/20  7:54 PM   Specimen: BLOOD RIGHT HAND  Result Value Ref Range Status   Specimen Description BLOOD RIGHT HAND  Final   Special Requests   Final    BOTTLES DRAWN AEROBIC AND ANAEROBIC Blood Culture adequate volume   Culture   Final    NO GROWTH 3 DAYS Performed at [facility], [place] [address] [place],  [address]    Report Status PENDING  Incomplete  Culture, blood (Routine X 2) w Reflex to ID Panel  Status: None (Preliminary result)   Collection Time: 10/11/20  8:07 PM   Specimen: BLOOD LEFT HAND  Result Value Ref Range Status   Specimen Description BLOOD LEFT HAND  Final   Special Requests   Final    BOTTLES DRAWN AEROBIC AND ANAEROBIC Blood  Culture adequate volume   Culture   Final    NO GROWTH 3 DAYS Performed at [facility], [address], [place], [place] [address]    Report Status PENDING  Incomplete      Studies: No results found.  Scheduled Meds: . ALPRAZolam  1 mg Oral TID  . aspirin EC  81 mg Oral Daily  . Chlorhexidine Gluconate Cloth  6 each Topical Daily  . ferrous gluconate  324 mg Oral BID WC  . gabapentin  300 mg Oral Q8H  . hydrALAZINE  10 mg Intravenous Once  . mouth rinse  15 mL Mouth Rinse BID  . mirtazapine  30 mg Oral QHS  . nicotine  14 mg Transdermal Daily  . potassium chloride  40 mEq Oral BID  . sodium chloride flush  10-40 mL Intracatheter Q12H    Continuous Infusions: . ceFEPime (MAXIPIME) IV 2 g (10/14/20 0949)  . dextrose 5 % and 0.45% NaCl 50 mL/hr at 10/14/20 1014     LOS: 4 days     Kayleen Memos, MD [facility] Pager [phone]  If 7PM-7AM, please contact night-coverage [email or web address] Password [email or web address] 10/14/2020, 2:42 PM

## 2020-10-15 ENCOUNTER — Encounter (HOSPITAL_COMMUNITY): Payer: Self-pay | Admitting: Orthopaedic Surgery

## 2020-10-15 ENCOUNTER — Inpatient Hospital Stay (HOSPITAL_COMMUNITY): Payer: Medicare HMO

## 2020-10-15 DIAGNOSIS — J9611 Chronic respiratory failure with hypoxia: Secondary | ICD-10-CM

## 2020-10-15 DIAGNOSIS — C3490 Malignant neoplasm of unspecified part of unspecified bronchus or lung: Secondary | ICD-10-CM | POA: Diagnosis not present

## 2020-10-15 DIAGNOSIS — J9601 Acute respiratory failure with hypoxia: Secondary | ICD-10-CM | POA: Diagnosis not present

## 2020-10-15 LAB — BLOOD GAS, ARTERIAL
Acid-base deficit: 3.5 mmol/L — ABNORMAL HIGH (ref 0.0–2.0)
Bicarbonate: 20.8 mmol/L (ref 20.0–28.0)
Drawn by: 40415
FIO2: 1
O2 Saturation: 82.5 %
Patient temperature: 37.8
pCO2 arterial: 37.6 mmHg (ref 32.0–48.0)
pH, Arterial: 7.366 (ref 7.350–7.450)
pO2, Arterial: 54.8 mmHg — ABNORMAL LOW (ref 83.0–108.0)

## 2020-10-15 LAB — CBC
HCT: 27.9 % — ABNORMAL LOW (ref 39.0–52.0)
Hemoglobin: 9.1 g/dL — ABNORMAL LOW (ref 13.0–17.0)
MCH: 32.2 pg (ref 26.0–34.0)
MCHC: 32.6 g/dL (ref 30.0–36.0)
MCV: 98.6 fL (ref 80.0–100.0)
Platelets: 94 10*3/uL — ABNORMAL LOW (ref 150–400)
RBC: 2.83 MIL/uL — ABNORMAL LOW (ref 4.22–5.81)
RDW: 18.7 % — ABNORMAL HIGH (ref 11.5–15.5)
WBC: 9.9 10*3/uL (ref 4.0–10.5)
nRBC: 0 % (ref 0.0–0.2)

## 2020-10-15 LAB — POCT I-STAT 7, (LYTES, BLD GAS, ICA,H+H)
Acid-base deficit: 5 mmol/L — ABNORMAL HIGH (ref 0.0–2.0)
Acid-base deficit: 3 mmol/L — ABNORMAL HIGH (ref 0.0–2.0)
Bicarbonate: 23.9 mmol/L (ref 20.0–28.0)
Bicarbonate: 23.9 mmol/L (ref 20.0–28.0)
Calcium, Ion: 1.34 mmol/L (ref 1.15–1.40)
Calcium, Ion: 1.23 mmol/L (ref 1.15–1.40)
HCT: 29 % — ABNORMAL LOW (ref 39.0–52.0)
HCT: 24 % — ABNORMAL LOW (ref 39.0–52.0)
Hemoglobin: 9.9 g/dL — ABNORMAL LOW (ref 13.0–17.0)
Hemoglobin: 8.2 g/dL — ABNORMAL LOW (ref 13.0–17.0)
O2 Saturation: 88 %
O2 Saturation: 97 %
Patient temperature: 99.3
Patient temperature: 97.9
Potassium: 4.2 mmol/L (ref 3.5–5.1)
Potassium: 4.8 mmol/L (ref 3.5–5.1)
Sodium: 143 mmol/L (ref 135–145)
Sodium: 142 mmol/L (ref 135–145)
TCO2: 26 mmol/L (ref 22–32)
TCO2: 25 mmol/L (ref 22–32)
pCO2 arterial: 62 mmHg — ABNORMAL HIGH (ref 32.0–48.0)
pCO2 arterial: 49.5 mmHg — ABNORMAL HIGH (ref 32.0–48.0)
pH, Arterial: 7.195 — CL (ref 7.350–7.450)
pH, Arterial: 7.29 — ABNORMAL LOW (ref 7.350–7.450)
pO2, Arterial: 71 mmHg — ABNORMAL LOW (ref 83.0–108.0)
pO2, Arterial: 103 mmHg (ref 83.0–108.0)

## 2020-10-15 LAB — PHOSPHORUS: Phosphorus: 1.7 mg/dL — ABNORMAL LOW (ref 2.5–4.6)

## 2020-10-15 LAB — COMPREHENSIVE METABOLIC PANEL
ALT: 47 U/L — ABNORMAL HIGH (ref 0–44)
AST: 42 U/L — ABNORMAL HIGH (ref 15–41)
Albumin: 2.1 g/dL — ABNORMAL LOW (ref 3.5–5.0)
Alkaline Phosphatase: 80 U/L (ref 38–126)
Anion gap: 7 (ref 5–15)
BUN: 16 mg/dL (ref 8–23)
CO2: 24 mmol/L (ref 22–32)
Calcium: 8.2 mg/dL — ABNORMAL LOW (ref 8.9–10.3)
Chloride: 110 mmol/L (ref 98–111)
Creatinine, Ser: 1.22 mg/dL (ref 0.61–1.24)
GFR, Estimated: >60 mL/min (ref >60)
Glucose, Bld: 178 mg/dL — ABNORMAL HIGH (ref 70–99)
Potassium: 4.3 mmol/L (ref 3.5–5.1)
Sodium: 141 mmol/L (ref 135–145)
Total Bilirubin: 0.8 mg/dL (ref 0.3–1.2)
Total Protein: 5.2 g/dL — ABNORMAL LOW (ref 6.5–8.1)

## 2020-10-15 LAB — MAGNESIUM: Magnesium: 1.8 mg/dL (ref 1.7–2.4)

## 2020-10-15 LAB — GLUCOSE, CAPILLARY
Glucose-Capillary: 107 mg/dL — ABNORMAL HIGH (ref 70–99)
Glucose-Capillary: 110 mg/dL — ABNORMAL HIGH (ref 70–99)
Glucose-Capillary: 111 mg/dL — ABNORMAL HIGH (ref 70–99)
Glucose-Capillary: 124 mg/dL — ABNORMAL HIGH (ref 70–99)
Glucose-Capillary: 128 mg/dL — ABNORMAL HIGH (ref 70–99)
Glucose-Capillary: 166 mg/dL — ABNORMAL HIGH (ref 70–99)

## 2020-10-15 LAB — MRSA PCR SCREENING: MRSA by PCR: NEGATIVE

## 2020-10-15 MED ORDER — FAMOTIDINE 40 MG/5ML PO SUSR
20.0000 mg | Freq: Two times a day (BID) | ORAL | Status: DC
  Administered 2020-10-15 – 2020-10-29 (×29): 20 mg
  Filled 2020-10-15 (×30): qty 2.5

## 2020-10-15 MED ORDER — REVEFENACIN 175 MCG/3ML IN SOLN
175.0000 ug | Freq: Every day | RESPIRATORY_TRACT | Status: DC
  Administered 2020-10-15 – 2020-11-10 (×24): 175 ug via RESPIRATORY_TRACT
  Filled 2020-10-15 (×27): qty 3

## 2020-10-15 MED ORDER — ARFORMOTEROL TARTRATE 15 MCG/2ML IN NEBU
15.0000 ug | INHALATION_SOLUTION | Freq: Two times a day (BID) | RESPIRATORY_TRACT | Status: DC
  Administered 2020-10-16 – 2020-11-10 (×47): 15 ug via RESPIRATORY_TRACT
  Filled 2020-10-15 (×51): qty 2

## 2020-10-15 MED ORDER — PROPOFOL 1000 MG/100ML IV EMUL
0.0000 ug/kg/min | INTRAVENOUS | Status: DC
  Administered 2020-10-15: 10 ug/kg/min via INTRAVENOUS
  Filled 2020-10-15: qty 100

## 2020-10-15 MED ORDER — GABAPENTIN 250 MG/5ML PO SOLN
300.0000 mg | Freq: Three times a day (TID) | ORAL | Status: DC
  Filled 2020-10-15 (×2): qty 6

## 2020-10-15 MED ORDER — FERROUS SULFATE 300 (60 FE) MG/5ML PO SYRP
300.0000 mg | ORAL_SOLUTION | Freq: Every day | ORAL | Status: DC
  Administered 2020-10-15: 300 mg
  Filled 2020-10-15 (×2): qty 5

## 2020-10-15 MED ORDER — FENTANYL CITRATE (PF) 100 MCG/2ML IJ SOLN
25.0000 ug | INTRAMUSCULAR | Status: DC | PRN
Start: 2020-10-15 — End: 2020-10-16

## 2020-10-15 MED ORDER — IOHEXOL 350 MG/ML SOLN
75.0000 mL | Freq: Once | INTRAVENOUS | Status: AC | PRN
  Administered 2020-10-15: 75 mL via INTRAVENOUS

## 2020-10-15 MED ORDER — DOCUSATE SODIUM 50 MG/5ML PO LIQD
100.0000 mg | Freq: Two times a day (BID) | ORAL | Status: DC
  Administered 2020-10-15 – 2020-10-17 (×4): 100 mg
  Filled 2020-10-15 (×4): qty 10

## 2020-10-15 MED ORDER — ROCURONIUM BROMIDE 50 MG/5ML IV SOLN
10.0000 mg | Freq: Once | INTRAVENOUS | Status: AC
  Filled 2020-10-15: qty 1

## 2020-10-15 MED ORDER — KETAMINE HCL 50 MG/5ML IJ SOSY
PREFILLED_SYRINGE | INTRAMUSCULAR | Status: AC
  Filled 2020-10-15: qty 5

## 2020-10-15 MED ORDER — ROCURONIUM BROMIDE 10 MG/ML (PF) SYRINGE
PREFILLED_SYRINGE | INTRAVENOUS | Status: AC
  Administered 2020-10-15: 50 mg
  Filled 2020-10-15: qty 10

## 2020-10-15 MED ORDER — SODIUM CHLORIDE 0.9 % IV SOLN
2.0000 g | Freq: Two times a day (BID) | INTRAVENOUS | Status: AC
  Administered 2020-10-15 – 2020-10-16 (×3): 2 g via INTRAVENOUS
  Filled 2020-10-15 (×3): qty 2

## 2020-10-15 MED ORDER — ACETAMINOPHEN 650 MG RE SUPP: 650.0000 mg | Freq: Four times a day (QID) | RECTAL | Status: DC | PRN

## 2020-10-15 MED ORDER — ACETAMINOPHEN 160 MG/5ML PO SOLN: 500.0000 mg | Freq: Four times a day (QID) | ORAL | Status: DC | PRN

## 2020-10-15 MED ORDER — PROSOURCE TF PO LIQD
45.0000 mL | Freq: Every day | ORAL | Status: DC
  Administered 2020-10-15 – 2020-10-17 (×3): 45 mL
  Filled 2020-10-15 (×3): qty 45

## 2020-10-15 MED ORDER — MIRTAZAPINE 15 MG PO TABS
30.0000 mg | ORAL_TABLET | Freq: Every day | ORAL | Status: DC
  Administered 2020-10-15 – 2020-10-29 (×15): 30 mg
  Filled 2020-10-15 (×3): qty 2
  Filled 2020-10-15: qty 1
  Filled 2020-10-15: qty 2
  Filled 2020-10-15: qty 1
  Filled 2020-10-15 (×2): qty 2
  Filled 2020-10-15: qty 1
  Filled 2020-10-15 (×5): qty 2
  Filled 2020-10-15 (×2): qty 1

## 2020-10-15 MED ORDER — FENTANYL CITRATE (PF) 100 MCG/2ML IJ SOLN
25.0000 ug | INTRAMUSCULAR | Status: DC | PRN
  Administered 2020-10-15 – 2020-10-16 (×6): 100 ug via INTRAVENOUS
  Filled 2020-10-15 (×7): qty 2

## 2020-10-15 MED ORDER — FAMOTIDINE IN NACL 20-0.9 MG/50ML-% IV SOLN
20.0000 mg | Freq: Two times a day (BID) | INTRAVENOUS | Status: DC
  Administered 2020-10-15: 20 mg via INTRAVENOUS
  Filled 2020-10-15: qty 50

## 2020-10-15 MED ORDER — ORAL CARE MOUTH RINSE
15.0000 mL | OROMUCOSAL | Status: DC
  Administered 2020-10-15 – 2020-10-17 (×24): 15 mL via OROMUCOSAL

## 2020-10-15 MED ORDER — ETOMIDATE 2 MG/ML IV SOLN: 20.0000 mg | Freq: Once | INTRAVENOUS | Status: AC

## 2020-10-15 MED ORDER — MIDAZOLAM HCL 2 MG/2ML IJ SOLN: 2.0000 mg | Freq: Once | INTRAMUSCULAR | Status: AC

## 2020-10-15 MED ORDER — ETOMIDATE 2 MG/ML IV SOLN
INTRAVENOUS | Status: AC
  Administered 2020-10-15: 20 mg via INTRAVENOUS
  Filled 2020-10-15: qty 20

## 2020-10-15 MED ORDER — POLYETHYLENE GLYCOL 3350 17 G PO PACK
17.0000 g | PACK | Freq: Every day | ORAL | Status: DC
  Administered 2020-10-15 – 2020-10-17 (×3): 17 g
  Filled 2020-10-15 (×3): qty 1

## 2020-10-15 MED ORDER — VANCOMYCIN HCL 1500 MG/300ML IV SOLN
1500.0000 mg | INTRAVENOUS | Status: DC
  Administered 2020-10-15: 1500 mg via INTRAVENOUS
  Filled 2020-10-15: qty 300

## 2020-10-15 MED ORDER — CHLORHEXIDINE GLUCONATE 0.12% ORAL RINSE (MEDLINE KIT)
15.0000 mL | Freq: Two times a day (BID) | OROMUCOSAL | Status: DC
  Administered 2020-10-15 – 2020-11-04 (×30): 15 mL via OROMUCOSAL

## 2020-10-15 MED ORDER — MIDAZOLAM HCL 2 MG/2ML IJ SOLN
INTRAMUSCULAR | Status: AC
  Administered 2020-10-15: 2 mg via INTRAVENOUS
  Filled 2020-10-15: qty 2

## 2020-10-15 MED ORDER — FENTANYL CITRATE (PF) 100 MCG/2ML IJ SOLN
INTRAMUSCULAR | Status: AC
  Administered 2020-10-15: 100 ug via INTRAVENOUS
  Filled 2020-10-15: qty 2

## 2020-10-15 MED ORDER — ACETAMINOPHEN 325 MG PO TABS
650.0000 mg | ORAL_TABLET | Freq: Four times a day (QID) | ORAL | Status: DC | PRN
  Administered 2020-10-24 – 2020-10-26 (×3): 650 mg
  Filled 2020-10-15 (×3): qty 2

## 2020-10-15 MED ORDER — ASPIRIN 81 MG PO CHEW
81.0000 mg | CHEWABLE_TABLET | Freq: Every day | ORAL | Status: DC
  Administered 2020-10-15 – 2020-10-29 (×15): 81 mg
  Filled 2020-10-15 (×15): qty 1

## 2020-10-15 MED ORDER — VITAL 1.5 CAL PO LIQD
1000.0000 mL | ORAL | Status: DC
  Administered 2020-10-15: 1000 mL

## 2020-10-15 MED ORDER — GABAPENTIN 250 MG/5ML PO SOLN
100.0000 mg | Freq: Three times a day (TID) | ORAL | Status: DC
  Administered 2020-10-15 – 2020-10-30 (×42): 100 mg
  Filled 2020-10-15 (×50): qty 2

## 2020-10-15 MED ORDER — HEPARIN SODIUM (PORCINE) 5000 UNIT/ML IJ SOLN
5000.0000 [IU] | Freq: Three times a day (TID) | INTRAMUSCULAR | Status: DC
  Administered 2020-10-15 – 2020-11-10 (×78): 5000 [IU] via SUBCUTANEOUS
  Filled 2020-10-15 (×78): qty 1

## 2020-10-15 MED ORDER — POLYETHYLENE GLYCOL 3350 17 G PO PACK: 17.0000 g | PACK | Freq: Every day | ORAL | Status: DC | PRN

## 2020-10-15 NOTE — Progress Notes (Signed)
TRH night shift.  The rapid response team was called to bedside after the patient had significant respiratory distress with accessory muscle use and an O2 sat in the 50s on room air.  The patient was admitted on 10/10/2020 after he was found by his brother down at home for an unknown amount of time.  Next day, the patient was intubated emergently after developing hypoxia.  He was extubated on 402 2022 and transferred to the progressive unit from the ICU on 10/14/2020.  Earlier today (10/15/2020), the patient has solid respiratory distress with hypoxia, tachypnea, tachycardia and hypertension.  ABG, EKG and chest radiograph were obtained.  Discussed the case with Dr. Marchelle Gearing from PCCM who had to reintubate him emergently.  Please see notes from Dr. Patsey Berthold, primary progressive unit and rapid response nursing staff for further details.  Tennis Must, MD.

## 2020-10-15 NOTE — Progress Notes (Signed)
Transported pt to CT and back to room 3M14 on servo. Pt stable VS. Will cont. To monitor

## 2020-10-15 NOTE — Progress Notes (Signed)
Pt transported from 3M14 to CT and back with no complications.

## 2020-10-15 NOTE — Progress Notes (Addendum)
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 Found by brother 3/30 after he had been unable to reach pt-- unknown period of time down (1-3days suspected). Fractured L frontal sinus,L L3 transverse process, displaced angulated mildly comminuted L femoral neck fx. AKI with rhabdo.   On 3/31 pt worsening hypoxia and respiratory distress >> intubated Extubated 4/2.   Transferred out of ICU on 4/3  This evening, possible aspiration event, followed by dyspnea, increased WOB, hypoxemia.   Pertinent  Medical History  CAD MI COPD Metastatic small call lung cancer Anxiety Tobacco use    Significant Hospital Events: Including procedures, antibiotic start and stop dates in addition to other pertinent events   . 3/30 admitted to [facility] for encephalopathy. Facial fx, L3 TP fx, Hip fx.  . 3/31 non op from [facility] standpoint. Ortho planned to OR 3/31 however acute hypoxic respiratory failure, transferred to ICU and emergently intubated.    3/31 ETT >>4/1 331 bronchoscopy with BAL left lower lobe  4/1 LT  Hip Hemiarthroplasty (Anterior Approach)   Interim History / Subjective:      Objective   Blood pressure (!) 187/74, pulse (!) 135, temperature 100.1 F (37.8 C), temperature source Oral, resp. rate (!) 29, height _0  (1.753 m), weight 68.7 kg, SpO2 100 %.    Vent Mode: PRVC FiO2 (%):  [100 %] 100 % Set Rate:  [16 bmp] 16 bmp Vt Set:  [560 mL] 560 mL PEEP:  [5 cmH20] 5 cmH20   Intake/Output Summary (Last 24 hours) at 10/15/2020 0233 Last data filed at 10/14/2020 2100 Gross per 24 hour  Intake 1257.28 ml  Output 2200 ml  Net -942.72 ml   Filed Weights    10/12/20 0434 10/13/20 0500 10/14/20 0612  Weight: 62.5 kg 65.8 kg 68.7 kg    Examination: General: Chronically  ill appearing thin adult M, appears older than stated age. eccymosis L periorbital.  Minimally responsive to voice, opens eyes but very drowsy, accessory muscles to breath, rate slowing down, 88% on 15L NRB.   HENT: Temporal muscle wasting. L frontal/periorbital ecchymosis and edema.  Left subconjunctival hemorrhage Lungs: + accessory muscle use, + rhonchi, decreased BS on L  Cardiovascular: tachycardia (sinus) Abdomen: soft flat ndnt  Extremities: Clubbing. No pitting edema. No cyanosis Neuro: lethargic GU: Urine in Foley bag   Labs/imaging that I havepersonally reviewed  (right click and "Reselect all SmartList Selections" daily)   Cbc, cmp, abg   3/30 CT head/chest abdomen pelvis with L frontal sinus fx, L3 left transverse process fx, L femoral neck fx , multiple nodules stable from CT February, aspiration debris and bronchi 3/31 BAL culture >> ng  Resolved Hospital Problem list     Assessment & Plan:  Hypoxemic respiratory failure:  Hx of COPD CXR with possible retrocardiac opacity.   Cont cefepime, adding vanc back.   Given profound change in oxygenation and tachycardia, as well as relatively clear lungs, will check CT A PE protocol.  Recent fracture and immobility.   Intubated.   Cont LTV ventilation.  Recheck ABG in 1/2 hr.     Acute encephalopathy Had improved, now worsened again.   -hypoxia  v ?concussion r/t fall, metabolic component  Chronic pain - home percocet and gabapantin Hx anxiety - home remeron, atarax, xanax  Intubated for hypoxemia and airway protection Check CT head given acute change.    Metastatic small cell carcinoma -lungs, liver  -follows with Dr Delton Coombes, is on 13th cycle of chemo (aloxi) -Head CT has not shown brain mets  L femoral neck Fx status post hemiarthroplasty -ortho following , okay to mobilize  L facial  fx L3 TP fx -NSGY evaluated, no surgical intervention indicated   Oliguria Rhabdomyolysis -resolved CK decreased to 556 from 4k  CAD  -resume home ASA   Thrombocytopenia-rapid drop from 1 66-77K , doubt HIT -Monitor  Tobacco abuse disorder  -cessation counseling when able    Labs   CBC: Recent Labs  Lab 10/11/20 0247 10/12/20 0135 10/12/20 0537 10/13/20 0451 10/13/20 1605 10/14/20 0339  WBC 10.0  --  14.6* 10.2 11.1* 8.8  NEUTROABS  --   --   --   --  8.9*  --   HGB 11.0* 10.9* 9.0* 8.4* 9.3* 9.3*  HCT 33.3* 32.0* 27.1* 25.0* 27.9* 26.6*  MCV 98.2  --  96.8 96.2 95.5 92.4  PLT 136*  --  91* 77* 79* 88*    Basic Metabolic Panel: Recent Labs  Lab 10/10/20 1602 10/11/20 0247 10/12/20 0135 10/12/20 0537 10/13/20 0451 10/14/20 0339  NA 143 143 145 142 135 141  K 3.4* 3.5 4.3 4.2 3.0* 3.0*  CL 106 116*  --  117* 110 112*  CO2 24 19*  --  18* 20* 23  GLUCOSE 120* 93  --  106* 133* 131*  BUN 50* 41*  --  37* 20 14  CREATININE 1.14 1.03  --  0.98 0.80 0.90  CALCIUM 10.0 8.7*  --  8.1* 7.9* 8.3*  MG 2.4  --   --   --  1.8  --   PHOS  --   --   --   --  1.5*  --    GFR: Estimated Creatinine Clearance: 75.3 mL/min (by C-G formula based on SCr of 0.9 mg/dL). Recent Labs  Lab 10/10/20 1600 10/10/20 1602 10/10/20 1825 10/11/20 0247 10/11/20 1141 10/11/20 1345 10/12/20 0537 10/13/20 0451 10/13/20 1605 10/14/20 0339  WBC  --    < >  --    < >  --   --  14.6* 10.2 11.1* 8.8  LATICACIDVEN 2.6*  --  2.8*  --  2.7* 0.7  --   --   --   --    < > = values in this interval not displayed.    Liver Function Tests: Recent Labs  Lab 10/10/20 1602 10/11/20 0247 10/12/20 0537 10/13/20 0451 10/14/20 0339  AST 209* 127* 47* 45* 44*  ALT 117* 91* 59* 55* 52*  ALKPHOS 60 52 40 42 76  BILITOT 1.3* 1.3* 1.2 1.0 1.2  PROT 7.4 6.2* 5.3* 5.1* 5.2*  ALBUMIN 4.2 3.4* 2.7* 2.3* 2.3*   Recent Labs  Lab 10/10/20 1602  LIPASE 27   No results for input(s): AMMONIA in  the last 168 hours.  ABG    Component Value Date/Time   PHART 7.366 10/15/2020 0145   PCO2ART 37.6 10/15/2020 0145   PO2ART 54.8 (L) 10/15/2020 0145   HCO3 20.8 10/15/2020 0145   TCO2 19 (L) 10/12/2020 0135   ACIDBASEDEF 3.5 (H) 10/15/2020 0145   O2SAT 82.5 10/15/2020 0145     Coagulation Profile: Recent Labs  Lab 10/10/20 1602  INR 1.2    Cardiac Enzymes: Recent Labs  Lab 10/10/20 1602 10/11/20 1141 10/12/20 0537  CKTOTAL 4,232* 1,873* 556*    HbA1C: Hgb A1c MFr Bld  Date/Time Value Ref Range Status  10/20/2013 01:12 PM 5.6 <5.7 % Final    Comment:                                                                           According to the ADA Clinical Practice Recommendations for 2011, when HbA1c is used as a screening test:     >=6.5%   Diagnostic of Diabetes Mellitus            (if abnormal result is confirmed)   5.7-6.4%   Increased risk of developing Diabetes Mellitus   References:Diagnosis and Classification of Diabetes Mellitus,Diabetes FEXM,1470,92(HVFMB 1):S62-S69 and Standards of Medical Care in         Diabetes - 2011,Diabetes BUYZ,7096,43 (Suppl 1):S11-S61.      CBG: Recent Labs  Lab 10/11/20 2340 10/12/20 0347 10/12/20 0752 10/12/20 1123 10/12/20 2335  GLUCAP 93 78 117* 127* 131*          10/15/2020, 2:33 AM

## 2020-10-15 NOTE — Progress Notes (Signed)
eLink Physician-Brief Progress Note Patient Name: [name] DOB: 07-28-1950 MRN: [id number]   Date of Service  10/15/2020  HPI/Events of Note  89M with COPD, small cell carcinoma with metastases to lung and liver who developed acute hypoxemic respiratory failure this evening and was emergently transferred to the ICU and intubated.  He was recently extubated and transferred out of the ICU following improvement after his initial presentation for a fall at home c/b left hip fracture (now s/p hemiarthroplasty on 4/1), fracture of L frontal sinus, left L3 transverse process, and rhabdomyolysis with later development of hypoxemia and respiratory distress on 3/31.  Suspicion is that he aspirated resulting in his acute hypoxemic respiratory failure.  CTA Chest obtained which ruled out PE and did demonstrate abundant retained secretions in the bilateral lower lobes and RML with aspiration favored. Suspected pneumonia.   eICU Interventions  # Neuro: - Fent/Prop for sedation/analgesia. - Daily SAT/SBT.  # Respiratory: - Treat with vanc/cefepime for pneumonia. - Obtain sputum culture. - Full vent support. Increased RR to 24/min based on ABG of 7.20/61/69/24 when on PRVC 560 (8cc/kg IBW) x 16, 5, 80%. - Repeat ABG In AM.  # Cardiac: - ASA  # GI: - Start tube feeds in AM.  # Heme-Onc: - Metastatic small cell ca.  DVT PPX: If platelets stable above 50k this AM, would start Lovenox ppx GI PPX: Start famotidine IV BID (intubated)     Intervention Category Evaluation Type: New Patient Evaluation  Marily Lente Yesennia Hirota 10/15/2020, 2:22 AM

## 2020-10-15 NOTE — Progress Notes (Signed)
Pt became acutley tachycardiac, labored breathing, diaphoretic, hypertensive, decrease LOC. Pt placed on 100% non rebreather. RRT called to bedside. MD called to bedside. ABG, EKG, CXR done. Pt transferred to ICU on montior, o2, with MD, RT and RN. Bedside report given to ICU RN. Carroll Sage passed on.

## 2020-10-15 NOTE — Progress Notes (Addendum)
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 Found by brother 3/30 after he had been unable to reach pt-- unknown period of time down (1-3days suspected). Fractured L frontal sinus,L L3 transverse process, displaced angulated mildly comminuted L femoral neck fx. AKI with rhabdo.   On 3/31 pt worsening hypoxia and respiratory distress >> intubated Extubated 4/2 and transferred out of ICU on 4/3. The evening of 4/3-4/4 he developed increased work of breathing and increasing oxygen needs requiring intubation. Suspected to have an aspiration event.   Pertinent  Medical History  CAD MI COPD Metastatic small call lung cancer Anxiety Tobacco use    Significant Hospital Events: Including procedures, antibiotic start and stop dates in addition to other pertinent events   . 3/30 admitted to [facility] for encephalopathy. Facial fx, L3 TP fx, Hip fx.  . 3/31 non op from [place] standpoint. Ortho planned to OR 3/31 however acute hypoxic respiratory failure, transferred to ICU and emergently intubated.    3/31 ETT >>4/1 331 bronchoscopy with BAL left lower lobe  4/1 LT  Hip Hemiarthroplasty (Anterior Approach)  4/4 ETT>>  Interim History / Subjective:   No acute events since transfer to the ICU. He remains on the ventilator.   Objective   Blood pressure 111/62, pulse 99, temperature 97.9 F (36.6 C), temperature source Axillary, resp. rate (!) 26, height _0  (1.753 m), weight 66.3 kg, SpO2 100 %.    Vent Mode: PRVC FiO2 (%):  [70 %-100 %] 70 % Set Rate:  [16 bmp-24 bmp] 24 bmp Vt Set:  [560 mL] 560 mL PEEP:  [5 cmH20] 5 cmH20 Plateau Pressure:  [14 cmH20] 14  cmH20   Intake/Output Summary (Last 24 hours) at 10/15/2020 1058 Last data filed at 10/15/2020 1000 Gross per 24 hour  Intake 1867.7 ml  Output 2795 ml  Net -927.3 ml   Filed Weights   10/13/20 0500 10/14/20 0612 10/15/20 0630  Weight: 65.8 kg 68.7 kg 66.3 kg    Examination: General: Chronically ill appearing. No acute distress. HENT: Temporal muscle wasting. L frontal/periorbital ecchymosis and edema.  Left subconjunctival hemorrhage Lungs: course breath sounds bilaterally. Cardiovascular: rrr, no murmurs Abdomen: soft, non-distended, BS+ Extremities: Clubbing. No pitting edema. No cyanosis Neuro: sedated, no purposeful movement GU: Urine in Foley bag   Labs/imaging that I havepersonally reviewed  (right click and "Reselect all SmartList Selections" daily)   3/30 CT head/chest abdomen pelvis with L frontal sinus fx, L3 left transverse process fx, L femoral neck fx , multiple nodules stable from CT February, aspiration debris and bronchi 3/31 BAL culture >> ng  4/4 CTA Chest: No pulmonary emboli. Diffuse centrilobular emphysema. Secretions present in the bilateral lower lobe and middle lobe airways.   Resolved Hospital Problem list     Assessment & Plan:   Acute Hypoxemic respiratory failure Aspiration Pneumonia COPD/Emphysema Hx of Small Cell Lung Cancer s/p chemotherapy with lurbinectedin, last cycle on 09/12/20 Plan - continue mechanical ventilatory support - Wean sedation as able. Will trial PSV tomorrow AM for possible extuabtion - Continue cefepime for aspiration/HCAP coverage. Stop vancomycin, MRSA pcr screen negative. - Follow up tracheal aspirate culture - Brovana/Yupelri nebulizer  treatments and PRN albuterol nebs   Acute encephalopathy Likely in setting of acute respiratory failure due to aspiration.  Plan: - Will check head CT if mental status does not improve with reducing sedating meds. - Reduce gabapentin to 139m TID from 3066m  - Hold home ativan.     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 -Monitor  Tobacco abuse disorder  -cessation counseling when able  Best practice (right click and "Reselect all SmartList Selections" daily)  Diet:  Tube Feed  Pain/Anxiety/Delirium protocol (if indicated): Yes (RASS goal 0) VAP protocol (if indicated): Yes DVT prophylaxis: Subcutaneous Heparin GI prophylaxis: PPI Glucose control:  SSI Yes Central venous access:  Yes, and it is still needed Arterial line:  N/A Foley:  N/A Mobility:  bed rest  PT consulted: N/A Last date of multidisciplinary goals of care discussion [Update brother KeLevada Dy/4/22] Code Status:  full code Disposition: ICU   Labs   CBC: Recent Labs  Lab 10/12/20 0537 10/13/20 0451 10/13/20 1605 10/14/20 0339 10/15/20 0303 10/15/20 0449 10/15/20 0605  WBC 14.6* 10.2 11.1* 8.8  --  9.9  --   NEUTROABS  --   --  8.9*  --   --   --   --   HGB 9.0* 8.4* 9.3* 9.3* 9.9* 9.1* 8.2*  HCT 27.1* 25.0* 27.9* 26.6* 29.0* 27.9* 24.0*  MCV 96.8 96.2 95.5 92.4  --  98.6  --   PLT 91* 77* 79* 88*  --  94*  --     Basic Metabolic Panel: Recent Labs  Lab 10/10/20 1602 10/11/20 0247 10/12/20 0135 10/12/20 0537 10/13/20 0451 10/14/20 0339 10/15/20 0303 10/15/20 0449 10/15/20 0605  NA 143 143   < > 142 135 141 143 141 142  K 3.4* 3.5   < > 4.2 3.0* 3.0* 4.2 4.3 4.8  CL 106 116*  --  117* 110 112*  --  110  --   CO2 24 19*  --  18* 20* 23  --  24  --   GLUCOSE 120* 93  --  106* 133* 131*  --  178*  --   BUN 50* 41*  --  37* 20 14  --  16  --   CREATININE 1.14  1.03  --  0.98 0.80 0.90  --  1.22  --   CALCIUM 10.0 8.7*  --  8.1* 7.9* 8.3*  --  8.2*  --   MG 2.4  --   --   --  1.8  --   --   --   --   PHOS  --   --   --   --  1.5*  --   --   --   --    < > = values in this interval not displayed.   GFR: Estimated Creatinine Clearance: 53.6 mL/min (by C-G formula based on SCr of 1.22 mg/dL). Recent Labs  Lab 10/10/20 1600 10/10/20 1602 10/10/20 1825 10/11/20 0247 10/11/20 1141 10/11/20 1345 10/12/20 0537 10/13/20 0451 10/13/20 1605 10/14/20 0339 10/15/20 0449  WBC  --    < >  --    < >  --   --    < >  10.2 11.1* 8.8 9.9  LATICACIDVEN 2.6*  --  2.8*  --  2.7* 0.7  --   --   --   --   --    < > = values in this interval not displayed.    Liver Function Tests: Recent Labs  Lab 10/11/20 0247 10/12/20 0537 10/13/20 0451 10/14/20 0339 10/15/20 0449  AST 127* 47* 45* 44* 42*  ALT 91* 59* 55* 52* 47*  ALKPHOS 52 40 42 76 80  BILITOT 1.3* 1.2 1.0 1.2 0.8  PROT 6.2* 5.3* 5.1* 5.2* 5.2*  ALBUMIN 3.4* 2.7* 2.3* 2.3* 2.1*   Recent Labs  Lab 10/10/20 1602  LIPASE 27   No results for input(s): AMMONIA in the last 168 hours.  ABG    Component Value Date/Time   PHART 7.290 (L) 10/15/2020 0605   PCO2ART 49.5 (H) 10/15/2020 0605   PO2ART 103 10/15/2020 0605   HCO3 23.9 10/15/2020 0605   TCO2 25 10/15/2020 0605   ACIDBASEDEF 3.0 (H) 10/15/2020 0605   O2SAT 97.0 10/15/2020 0605     Coagulation Profile: Recent Labs  Lab 10/10/20 1602  INR 1.2    Cardiac Enzymes: Recent Labs  Lab 10/10/20 1602 10/11/20 1141 10/12/20 0537  CKTOTAL 4,232* 1,873* 556*    HbA1C: Hgb A1c MFr Bld  Date/Time Value Ref Range Status  10/20/2013 01:12 PM 5.6 <5.7 % Final    Comment:                                                                           According to the ADA Clinical Practice Recommendations for 2011, when HbA1c is used as a screening test:     >=6.5%   Diagnostic of Diabetes Mellitus            (if abnormal result is  confirmed)   5.7-6.4%   Increased risk of developing Diabetes Mellitus   References:Diagnosis and Classification of Diabetes Mellitus,Diabetes VQMG,8676,19(JKDTO 1):S62-S69 and Standards of Medical Care in         Diabetes - 2011,Diabetes IZTI,4580,99 (Suppl 1):S11-S61.      CBG: Recent Labs  Lab 10/12/20 0752 10/12/20 1123 10/12/20 2335 10/15/20 0301 10/15/20 0807  GLUCAP 117* 127* 131* 166* 124*    Critical Care Time: 45 minutes  Freda Jackson, MD [place] [facility] Office: [phone]   See Amion for personal pager PCCM on call pager [phone] until 7pm. Please call Elink 7p-7a. [phone]

## 2020-10-15 NOTE — Significant Event (Addendum)
Rapid Response Event Note   Reason for Call :  Respiratory distress-SpO2-50% on RA  Initial Focused Assessment:  Pt lying in bed with eyes open, in respiratory distress, +WOB, +accessory muscle use. Pt initially able to follow commands but unable to answer questions. Lungs rhonchus t/o. Skin hot and diaphoretic.   T-100.1, HR-135, BP-194/80, RR-35, SpO2-92% on NRB.   During assessment, pt became minimally responsive and was moved to 3M14 and intubated.   Interventions:  NRB ABG-7.36/37.6/54.8/20.8 PCXR-Bibasilar atelectasis or infiltrates. EKG-ST PCCM consulted Pt transferred to [place] of Care:  Pt transferred to 3M14 and immediately intubated.    Event Summary:   MD Notified: Dr. Olevia Bowens notified and came to bedside, Dr. Duwayne Heck PCCM consulted. Call Time:0135 Arrival [place] [id number]  Dillard Essex, RN

## 2020-10-15 NOTE — Progress Notes (Signed)
[place] Progress Note Patient Name: [name] DOB: [birth year] MRN: [id number]   Date of Service  10/15/2020  HPI/Events of Note  Patient with left upper extremity weakness per bedside RN, this has not been addressed.  eICU Interventions  Stat head CT ordered to r/o CVA.        Frederik Pear 10/15/2020, 9:46 PM

## 2020-10-15 NOTE — Progress Notes (Signed)
Pharmacy Antibiotic Note  [name] is a 70 y.o. male admitted on 10/10/2020, now reintubated with concern for pneumonia.  Pharmacy has been consulted for vancomycin dosing.  Remains on cefepime.  Plan: Vancomycin 1512m IV Q24H. Goal AUC 400-550.  Expected AUC 450.  SCr used 0.9.  Height: _0  (175.3 cm) Weight: 68.7 kg (151 lb 7.3 oz) IBW/kg (Calculated) : 70.7  Temp (24hrs), Avg:98.3 F (36.8 C), Min:97.8 F (36.6 C), Max:100.1 F (37.8 C)  Recent Labs  Lab 10/10/20 1600 10/10/20 1602 10/10/20 1602 10/10/20 1825 10/11/20 0247 10/11/20 1141 10/11/20 1345 10/12/20 0537 10/13/20 0451 10/13/20 1605 10/14/20 0339  WBC  --  11.8*   < >  --  10.0  --   --  14.6* 10.2 11.1* 8.8  CREATININE  --  1.14  --   --  1.03  --   --  0.98 0.80  --  0.90  LATICACIDVEN 2.6*  --   --  2.8*  --  2.7* 0.7  --   --   --   --    < > = values in this interval not displayed.    Estimated Creatinine Clearance: 75.3 mL/min (by C-G formula based on SCr of 0.9 mg/dL).    Allergies  Allergen Reactions  . Codeine Nausea Only  . Niaspan [Niacin Er]     Antimicrobials this admission: Vanc 3/30 >> 4/1, 4/4 >>  Cefepime 3/30 >>  Microbiology results: 3/31 MRSA PCR - negative 3/31 BCx - ngtd 3/31 UCx - no growth 3/31 BAL - normal flora  Thank you for allowing pharmacy to be a part of this patient's care.  VWynona Neat PharmD, BCPS  10/15/2020 3:00 AM

## 2020-10-15 NOTE — Progress Notes (Signed)
PHARMACY NOTE:  ANTIMICROBIAL RENAL DOSAGE ADJUSTMENT  Current antimicrobial regimen includes a mismatch between antimicrobial dosage and estimated renal function.  As per policy approved by the Pharmacy & Therapeutics and Medical Executive Committees, the antimicrobial dosage will be adjusted accordingly.  Current antimicrobial dosage:  Cefepime 2g IV q8h  Indication: Pneumonia  Renal Function: Scr 1.22 (CrCl 53.6 mL/min)  Estimated Creatinine Clearance: 53.6 mL/min (by C-G formula based on SCr of 1.22 mg/dL).    Antimicrobial dosage has been changed to:  Cefepime 2g IV q12h  Thank you for allowing pharmacy to be a part of this patient's care.  Rebbeca Paul, PharmD PGY1 Pharmacy Resident 10/15/2020 11:23 AM

## 2020-10-15 NOTE — Procedures (Signed)
Intubation Procedure Note  [name]  [id number]  December 28, 1950  Date:10/15/20  Time:6:18 AM   Provider Performing:Larnie Heart Duwayne Heck    Procedure: Intubation (82993)  Indication(s) Respiratory Failure  Consent Unable to obtain consent due to emergent nature of procedure.   Anesthesia Etomidate and Rocuronium   Time Out Verified patient identification, verified procedure, site/side was marked, verified correct patient position, special equipment/implants available, medications/allergies/relevant history reviewed, required imaging and test results available.   Sterile Technique Usual hand hygeine, masks, and gloves were used   Procedure Description Patient positioned in bed supine.  Sedation given as noted above.  Patient was intubated with endotracheal tube using Glidescope.  View was Grade 2 only posterior commissure .  Number of attempts was 1.  Colorimetric CO2 detector was consistent with tracheal placement.   Complications/Tolerance None; patient tolerated the procedure well. Chest X-ray is ordered to verify placement.   EBL Minimal   Specimen(s) None

## 2020-10-15 NOTE — Progress Notes (Signed)
Nutrition Follow-up  DOCUMENTATION CODES:   Severe malnutrition in context of chronic illness  INTERVENTION:   Initiate tube feeds via OG tube: - Vital 1.5 @ 50 ml/hr (1200 ml/day) - ProSource TF daily  Tube feeding regimen provides 1840 kcal, 92 grams of protein, and 917 ml of H2O.   NUTRITION DIAGNOSIS:   Severe Malnutrition related to chronic illness (small cell carcinoma of the lung) as evidenced by severe muscle depletion,severe fat depletion.  New diagnosis after completion of NFPE  GOAL:   Patient will meet greater than or equal to 90% of their needs  Met via TF  MONITOR:   Vent status,Labs,Weight trends,TF tolerance,Skin  REASON FOR ASSESSMENT:   Consult Enteral/tube feeding initiation and management  ASSESSMENT:   70 year old male who presented to the ED on 3/30 with AMS after a fall. Pt with unknown time down (1-3 days suspected). 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 Pt found to have left hip fracture, left L3 transverse process fracture, left frontal sinus fracture as well as rhabdomyolysis.  3/31 - rapid response for respiratory distress, transferred to ICU, intubated, therapeutic bronchoscopy 4/01 - s/p left hip hemiarthroplasty, application of incisional VAC, extubated 4/04 - rapid response due to acute hypoxemic respiratory failure, transferred to ICU, intubated  Consult received for tube feeding intiation and management. Discussed pt with RN and during ICU rounds.  OG tube in place. Okay to use per CCM.  Patient is currently intubated on ventilator support MV: 12.8 L/min Temp (24hrs), Avg:98.5 F (36.9 C), Min:97.9 F (36.6 C), Max:100.1 F (37.8 C) BP (cuff): 99/63 MAP (cuff): 73  Drips: Propofol: off this AM D5 in 1/2NS: 50 ml/hr  Medications reviewed and include: colace, fergon, remeron, miralax, IV abx, IV pepcid  Labs reviewed:  elevated LFTs, hemoglobin 8.2 CBG's: 124, 166  UOP: 2510 ml x 24 hours I/O's: +5.6 L since admit  NUTRITION - FOCUSED PHYSICAL EXAM:  Flowsheet Row Most Recent Value  Orbital Region Severe depletion  Upper Arm Region Moderate depletion  Thoracic and Lumbar Region Severe depletion  Buccal Region Unable to assess  Temple Region Severe depletion  Clavicle Bone Region Severe depletion  Clavicle and Acromion Bone Region Severe depletion  Scapular Bone Region Unable to assess  Dorsal Hand Unable to assess  Patellar Region Moderate depletion  Anterior Thigh Region Moderate depletion  Posterior Calf Region Moderate depletion  Edema (RD Assessment) None  Hair Reviewed  Eyes Unable to assess  Mouth Unable to assess  Skin Reviewed  Nails Reviewed       Diet Order:   Diet Order    None      EDUCATION NEEDS:   No education needs have been identified at this time  Skin:  Skin Assessment: Skin Integrity Issues: DTI: sacrum, right heel Incisions: left hip  Last BM:  no documented BM  Height:   Ht Readings from Last 1 Encounters:  10/11/20 5' 9"  (1.753 m)    Weight:   Wt Readings from Last 1 Encounters:  10/15/20 66.3 kg    BMI:  Body mass index is 21.58 kg/m.  Estimated Nutritional Needs:   Kcal:  1865  Protein:  85-100 grams  Fluid:  >/= 1.8 L    Gustavus Bryant, MS, RD, LDN Inpatient Clinical Dietitian Please see AMiON for contact information.

## 2020-10-16 ENCOUNTER — Inpatient Hospital Stay (HOSPITAL_COMMUNITY): Payer: Medicare HMO

## 2020-10-16 DIAGNOSIS — E43 Unspecified severe protein-calorie malnutrition: Secondary | ICD-10-CM | POA: Diagnosis present

## 2020-10-16 DIAGNOSIS — J9601 Acute respiratory failure with hypoxia: Secondary | ICD-10-CM | POA: Diagnosis not present

## 2020-10-16 LAB — POCT I-STAT 7, (LYTES, BLD GAS, ICA,H+H)
Acid-base deficit: 2 mmol/L (ref 0.0–2.0)
Bicarbonate: 22.5 mmol/L (ref 20.0–28.0)
Calcium, Ion: 1.26 mmol/L (ref 1.15–1.40)
HCT: 25 % — ABNORMAL LOW (ref 39.0–52.0)
Hemoglobin: 8.5 g/dL — ABNORMAL LOW (ref 13.0–17.0)
O2 Saturation: 93 %
Potassium: 4 mmol/L (ref 3.5–5.1)
Sodium: 143 mmol/L (ref 135–145)
TCO2: 24 mmol/L (ref 22–32)
pCO2 arterial: 36.1 mmHg (ref 32.0–48.0)
pH, Arterial: 7.403 (ref 7.350–7.450)
pO2, Arterial: 66 mmHg — ABNORMAL LOW (ref 83.0–108.0)

## 2020-10-16 LAB — CULTURE, BLOOD (ROUTINE X 2)
Culture: NO GROWTH
Culture: NO GROWTH
Culture: NO GROWTH
Culture: NO GROWTH
Special Requests: ADEQUATE
Special Requests: ADEQUATE
Special Requests: ADEQUATE
Special Requests: ADEQUATE

## 2020-10-16 LAB — BASIC METABOLIC PANEL
Anion gap: 4 — ABNORMAL LOW (ref 5–15)
BUN: 20 mg/dL (ref 8–23)
CO2: 25 mmol/L (ref 22–32)
Calcium: 8 mg/dL — ABNORMAL LOW (ref 8.9–10.3)
Chloride: 110 mmol/L (ref 98–111)
Creatinine, Ser: 0.86 mg/dL (ref 0.61–1.24)
GFR, Estimated: >60 mL/min (ref >60)
Glucose, Bld: 121 mg/dL — ABNORMAL HIGH (ref 70–99)
Potassium: 3.7 mmol/L (ref 3.5–5.1)
Sodium: 139 mmol/L (ref 135–145)

## 2020-10-16 LAB — CBC
HCT: 26.7 % — ABNORMAL LOW (ref 39.0–52.0)
Hemoglobin: 8.8 g/dL — ABNORMAL LOW (ref 13.0–17.0)
MCH: 31.8 pg (ref 26.0–34.0)
MCHC: 33 g/dL (ref 30.0–36.0)
MCV: 96.4 fL (ref 80.0–100.0)
Platelets: 94 10*3/uL — ABNORMAL LOW (ref 150–400)
RBC: 2.77 MIL/uL — ABNORMAL LOW (ref 4.22–5.81)
RDW: 18.7 % — ABNORMAL HIGH (ref 11.5–15.5)
WBC: 7 10*3/uL (ref 4.0–10.5)
nRBC: 0 % (ref 0.0–0.2)

## 2020-10-16 LAB — MAGNESIUM
Magnesium: 2 mg/dL (ref 1.7–2.4)
Magnesium: 1.8 mg/dL (ref 1.7–2.4)
Magnesium: 2.3 mg/dL (ref 1.7–2.4)

## 2020-10-16 LAB — COMPREHENSIVE METABOLIC PANEL
ALT: 42 U/L (ref 0–44)
AST: 40 U/L (ref 15–41)
Albumin: 2.1 g/dL — ABNORMAL LOW (ref 3.5–5.0)
Alkaline Phosphatase: 69 U/L (ref 38–126)
Anion gap: 7 (ref 5–15)
BUN: 21 mg/dL (ref 8–23)
CO2: 24 mmol/L (ref 22–32)
Calcium: 8 mg/dL — ABNORMAL LOW (ref 8.9–10.3)
Chloride: 109 mmol/L (ref 98–111)
Creatinine, Ser: 0.84 mg/dL (ref 0.61–1.24)
GFR, Estimated: >60 mL/min (ref >60)
Glucose, Bld: 123 mg/dL — ABNORMAL HIGH (ref 70–99)
Potassium: 3.8 mmol/L (ref 3.5–5.1)
Sodium: 140 mmol/L (ref 135–145)
Total Bilirubin: 0.6 mg/dL (ref 0.3–1.2)
Total Protein: 5.1 g/dL — ABNORMAL LOW (ref 6.5–8.1)

## 2020-10-16 LAB — GLUCOSE, CAPILLARY
Glucose-Capillary: 115 mg/dL — ABNORMAL HIGH (ref 70–99)
Glucose-Capillary: 118 mg/dL — ABNORMAL HIGH (ref 70–99)
Glucose-Capillary: 152 mg/dL — ABNORMAL HIGH (ref 70–99)
Glucose-Capillary: 156 mg/dL — ABNORMAL HIGH (ref 70–99)
Glucose-Capillary: 165 mg/dL — ABNORMAL HIGH (ref 70–99)
Glucose-Capillary: 168 mg/dL — ABNORMAL HIGH (ref 70–99)

## 2020-10-16 LAB — TRIGLYCERIDES: Triglycerides: 146 mg/dL (ref <150)

## 2020-10-16 LAB — PHOSPHORUS
Phosphorus: 2.5 mg/dL (ref 2.5–4.6)
Phosphorus: 4 mg/dL (ref 2.5–4.6)

## 2020-10-16 MED ORDER — ONDANSETRON HCL 4 MG/2ML IJ SOLN: 4.0000 mg | Freq: Three times a day (TID) | INTRAMUSCULAR | Status: DC | PRN

## 2020-10-16 MED ORDER — POTASSIUM PHOSPHATES 15 MMOLE/5ML IV SOLN
10.0000 mmol | Freq: Once | INTRAVENOUS | Status: AC
  Administered 2020-10-16: 10 mmol via INTRAVENOUS
  Filled 2020-10-16: qty 3.33

## 2020-10-16 MED ORDER — MIDAZOLAM HCL 2 MG/2ML IJ SOLN
INTRAMUSCULAR | Status: AC
  Filled 2020-10-16: qty 2

## 2020-10-16 MED ORDER — POLYETHYLENE GLYCOL 3350 17 G PO PACK: 17.0000 g | PACK | Freq: Every day | ORAL | Status: DC

## 2020-10-16 MED ORDER — FENTANYL CITRATE (PF) 100 MCG/2ML IJ SOLN
INTRAMUSCULAR | Status: AC
  Administered 2020-10-16: 50 ug via INTRAVENOUS
  Filled 2020-10-16: qty 2

## 2020-10-16 MED ORDER — FENTANYL CITRATE (PF) 100 MCG/2ML IJ SOLN
50.0000 ug | INTRAMUSCULAR | Status: DC | PRN
  Administered 2020-10-16 – 2020-10-17 (×2): 50 ug via INTRAVENOUS
  Filled 2020-10-16 (×2): qty 2

## 2020-10-16 MED ORDER — MIDAZOLAM HCL 2 MG/2ML IJ SOLN
2.0000 mg | Freq: Once | INTRAMUSCULAR | Status: AC
  Administered 2020-10-16: 2 mg via INTRAVENOUS

## 2020-10-16 MED ORDER — LACTATED RINGERS IV BOLUS
1000.0000 mL | Freq: Once | INTRAVENOUS | Status: AC
  Administered 2020-10-16: 1000 mL via INTRAVENOUS

## 2020-10-16 MED ORDER — METHYLPREDNISOLONE SODIUM SUCC 125 MG IJ SOLR
INTRAMUSCULAR | Status: AC
  Administered 2020-10-16: 125 mg
  Filled 2020-10-16: qty 2

## 2020-10-16 MED ORDER — DEXMEDETOMIDINE HCL IN NACL 400 MCG/100ML IV SOLN
0.4000 ug/kg/h | INTRAVENOUS | Status: DC
  Administered 2020-10-17: 0.4 ug/kg/h via INTRAVENOUS
  Administered 2020-10-18: 0.7 ug/kg/h via INTRAVENOUS
  Filled 2020-10-16 (×2): qty 100

## 2020-10-16 MED ORDER — BISACODYL 10 MG RE SUPP
10.0000 mg | Freq: Once | RECTAL | Status: AC
  Administered 2020-10-16: 10 mg via RECTAL
  Filled 2020-10-16: qty 1

## 2020-10-16 MED ORDER — METHYLPREDNISOLONE SODIUM SUCC 125 MG IJ SOLR: 125.0000 mg | Freq: Once | INTRAMUSCULAR | Status: AC

## 2020-10-16 MED ORDER — MAGNESIUM SULFATE 2 GM/50ML IV SOLN
2.0000 g | Freq: Once | INTRAVENOUS | Status: AC
  Administered 2020-10-16: 2 g via INTRAVENOUS
  Filled 2020-10-16: qty 50

## 2020-10-16 NOTE — Progress Notes (Signed)
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 Found by brother 3/30 after he had been unable to reach pt-- unknown period of time down (1-3days suspected). Fractured L frontal sinus,L L3 transverse process, displaced angulated mildly comminuted L femoral neck fx. AKI with rhabdo.   On 3/31 pt worsening hypoxia and respiratory distress >> intubated Extubated 4/2 and transferred out of ICU on 4/3. The evening of 4/3-4/4 he developed increased work of breathing and increasing oxygen needs requiring intubation. Suspected to have an aspiration event.   Pertinent  Medical History  CAD MI COPD Metastatic small call lung cancer Anxiety Tobacco use    Significant Hospital Events: Including procedures, antibiotic start and stop dates in addition to other pertinent events   . 3/30 admitted to [facility] for encephalopathy. Facial fx, L3 TP fx, Hip fx.  . 3/31 non op from [facility] standpoint. Ortho planned to OR 3/31 however acute hypoxic respiratory failure, transferred to ICU and emergently intubated.    3/31 ETT >>4/1 331 bronchoscopy with BAL left lower lobe  4/1 LT  Hip Hemiarthroplasty (Anterior Approach)  4/4 ETT>>  Interim History / Subjective:   Patient had episodes of vomiting this morning after resuming tube feeds yesterday.   KUB this morning showing bowel gas distension.   He is more awake this morning and following commands.  Objective   Blood pressure 128/61, pulse 100, temperature 99.9 F (37.7 C), temperature source Axillary, resp. rate 14, height _0  (1.753 m), weight 67.7 kg, SpO2 100 %.    Vent Mode: PSV;CPAP FiO2 (%):  [40 %-70 %] 40  % Set Rate:  [24 bmp] 24 bmp Vt Set:  [560 mL] 560 mL PEEP:  [5 cmH20] 5 cmH20 Pressure Support:  [5 cmH20] 5 cmH20 Plateau Pressure:  [14 cmH20-16 cmH20] 16 cmH20   Intake/Output Summary (Last 24 hours) at 10/16/2020 0819 Last data filed at 10/16/2020 0800 Gross per 24 hour  Intake 1691.4 ml  Output 1366 ml  Net 325.4 ml   Filed Weights   10/14/20 0612 10/15/20 0630 10/16/20 0400  Weight: 68.7 kg 66.3 kg 67.7 kg    Examination: General: Chronically ill appearing. No acute distress. HENT: Temporal muscle wasting. L frontal/periorbital ecchymosis and edema.  Left subconjunctival hemorrhage Lungs: course breath sounds bilaterally. Cardiovascular: rrr, no murmurs Abdomen: soft, non-distended, BS+ Extremities: Clubbing. No pitting edema. No cyanosis Neuro: moving all extremities, alert and awake following commands. GU: condom cath   Labs/imaging that I havepersonally reviewed  (right click and "Reselect all SmartList Selections" daily)   3/30 CT head/chest abdomen pelvis with L frontal sinus fx, L3 left transverse process fx, L femoral neck fx , multiple nodules stable from CT February, aspiration debris and bronchi 3/31 BAL culture >> ng  4/4 CTA Chest: No pulmonary emboli. Diffuse centrilobular emphysema. Secretions present in the bilateral lower lobe and middle lobe airways.   4/5 KUB: non-obstructive bowel gas pattern present. Improved aeration of left lower lobe.   Resolved Hospital Problem list     Assessment & Plan:   Acute Hypoxemic respiratory failure Aspiration Pneumonia COPD/Emphysema Hx of Small Cell Lung Cancer s/p chemotherapy with lurbinectedin, last cycle on  09/12/20 Plan - continue mechanical ventilatory support - Barrier to extubation is emesis. Will provide bowel regimen today to clear out GI tract as he is having nausea vomiting with distended bowel pattern on KUB this AM. - Continue cefepime for aspiration/HCAP coverage, today is day 7/7.  -  Brovana/Yupelri nebulizer treatments and PRN albuterol nebs  Nausea/Vomiting In setting of constipation, non-obstructive bowel gas pattern on KUB Plan: - dulcolax suppository this AM - Continue docusate and miralax.  Acute encephalopathy - improving Likely in setting of acute respiratory failure due to aspiration.  Plan: - Continue reduced gabapentin to 186m TID from 3054m  - Hold home ativan.    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 -Monitor  Tobacco abuse disorder  -cessation counseling when able  Best practice (right click and "Reselect all SmartList Selections" daily)  Diet:  Tube Feed  Pain/Anxiety/Delirium protocol (if indicated): Yes (RASS goal 0) VAP protocol (if indicated): Yes DVT prophylaxis: Subcutaneous Heparin GI prophylaxis: PPI Glucose control:  SSI Yes Central venous access:  Yes, and it is still needed Arterial line:  N/A Foley:  N/A Mobility:  bed rest  PT consulted: N/A Last date of multidisciplinary goals of care discussion [Update brother KeLevada Dy/4/22] Code Status:  full code Disposition: ICU   Labs   CBC: Recent Labs  Lab 10/13/20 0451 10/13/20 1605 10/14/20 0339 10/15/20 0303 10/15/20 0449 10/15/20 0605 10/16/20 0150 10/16/20 0434  WBC 10.2 11.1* 8.8  --  9.9  --   --  7.0  NEUTROABS  --  8.9*  --   --   --   --   --   --   HGB 8.4* 9.3* 9.3* 9.9* 9.1* 8.2* 8.5* 8.8*  HCT 25.0* 27.9* 26.6* 29.0* 27.9* 24.0* 25.0* 26.7*  MCV 96.2 95.5 92.4  --  98.6  --   --  96.4  PLT 77* 79* 88*  --  94*  --   --  94*    Basic  Metabolic Panel: Recent Labs  Lab 10/10/20 1602 10/11/20 0247 10/13/20 0451 10/14/20 0339 10/15/20 0303 10/15/20 0449 10/15/20 0605 10/15/20 1654 10/16/20 0044 10/16/20 0150 10/16/20 0434  NA 143   < > 135 141   < > 141 142  --  139 143 140  K 3.4*   < > 3.0* 3.0*   < > 4.3 4.8  --  3.7 4.0 3.8  CL 106   < > 110 112*  --  110  --   --  110  --  109  CO2 24   < > 20* 23  --  24  --   --  25  --  24  GLUCOSE 120*   < > 133* 131*  --  178*  --   --  121*  --  123*  BUN 50*   < > 20 14  --  16  --   --  20  --  21  CREATININE 1.14   < > 0.80 0.90  --  1.22  --   --  0.86  --  0.84  CALCIUM 10.0   < > 7.9* 8.3*  --  8.2*  --   --  8.0*  --  8.0*  MG 2.4  --  1.8  --   --   --   --  1.8 2.0  --  1.8  PHOS  --   --  1.5*  --   --   --   --  1.7*  --   --  2.5   < > = values in this interval not displayed.   GFR: Estimated Creatinine Clearance: 79.5 mL/min (by C-G formula based on SCr of 0.84 mg/dL). Recent Labs  Lab 10/10/20 1600 10/10/20 1602 10/10/20 1825 10/11/20 0247 10/11/20 1141 10/11/20 1345 10/12/20 0537 10/13/20 1605 10/14/20 0339 10/15/20 0449 10/16/20 0434  WBC  --    < >  --    < >  --   --    < > 11.1* 8.8 9.9 7.0  LATICACIDVEN 2.6*  --  2.8*  --  2.7* 0.7  --   --   --   --   --    < > = values in this interval not displayed.    Liver Function Tests: Recent Labs  Lab 10/12/20 0537 10/13/20 0451 10/14/20 0339 10/15/20 0449 10/16/20 0434  AST 47* 45* 44* 42* 40  ALT 59* 55* 52* 47* 42  ALKPHOS 40 42 76 80 69  BILITOT 1.2 1.0 1.2 0.8 0.6  PROT 5.3* 5.1* 5.2* 5.2* 5.1*  ALBUMIN 2.7* 2.3* 2.3* 2.1* 2.1*   Recent Labs  Lab 10/10/20 1602  LIPASE 27   No results for input(s): AMMONIA in the last 168 hours.  ABG    Component Value Date/Time   PHART 7.403 10/16/2020 0150   PCO2ART 36.1 10/16/2020 0150   PO2ART 66 (L) 10/16/2020 0150   HCO3 22.5 10/16/2020 0150   TCO2 24 10/16/2020 0150   ACIDBASEDEF 2.0 10/16/2020 0150   O2SAT 93.0 10/16/2020  0150     Coagulation Profile: Recent Labs  Lab 10/10/20 1602  INR 1.2    Cardiac Enzymes: Recent Labs  Lab 10/10/20 1602 10/11/20 1141 10/12/20 0537  CKTOTAL 4,232* 1,873* 556*    HbA1C: Hgb A1c MFr Bld  Date/Time Value Ref Range Status  10/20/2013 01:12 PM 5.6 <5.7 % Final    Comment:                                                                           According to the ADA Clinical Practice Recommendations for 2011, when HbA1c is used as a screening test:     >=6.5%   Diagnostic of Diabetes Mellitus            (if abnormal result is confirmed)   5.7-6.4%   Increased risk of developing Diabetes Mellitus   References:Diagnosis and Classification of Diabetes Mellitus,Diabetes ZOXW,9604,54(UJWJX 1):S62-S69 and Standards of Medical Care in         Diabetes - 2011,Diabetes BJYN,8295,62 (Suppl 1):S11-S61.      CBG: Recent Labs  Lab 10/15/20 1122 10/15/20 1548 10/15/20 2003 10/15/20 2351 10/16/20 0346  GLUCAP 111* 107* 110* 128* 115*    Critical Care Time: 40 minutes  Freda Jackson, MD [facility] Office: [phone]   See Amion for personal pager PCCM on call pager [phone] until 7pm. Please call Elink 7p-7a. [phone]

## 2020-10-16 NOTE — Progress Notes (Signed)
PT Cancellation Note  Patient Details Name: [name] MRN: [id number] DOB: 09/20/1950   Cancelled Treatment:    Reason Eval/Treat Not Completed: Medical issues which prohibited therapy. Pt now on vent and not currently able to tolerate therapy. Will continue to follow.   Shary Decamp [facility] 10/16/2020, 1:20 PM [place] Pager [phone] Office [phone]

## 2020-10-16 NOTE — Progress Notes (Signed)
[place] Progress Note Patient Name: JOSTEN WARMUTH DOB: 1950/10/16 MRN: [id number]   Date of Service  10/16/2020  HPI/Events of Note  Patient is vomiting. QTc 0.42.  eICU Interventions  PRN Zofran ordered x 3 doses.        Kerry Kass Stehanie Ekstrom 10/16/2020, 6:41 AM

## 2020-10-16 NOTE — Progress Notes (Signed)
[place] Progress Note Patient Name: [name] DOB: 05-18-1951 MRN: [id number]   Date of Service  10/16/2020  HPI/Events of Note  Patient with frequent ectopy, will check BMP, Mg ++, and an ABG to try to r/o common causes.  eICU Interventions  Above labs ordered.        Kerry Kass Sondra Blixt 10/16/2020, 12:45 AM

## 2020-10-16 NOTE — Progress Notes (Signed)
Placed patient's HOB flat to clean BM. Shortly after putting patient's HOB flat, patient's breathing became labored. Patient was switched from wean mode on vent back to [facility]. Attempted to suction ETT, but unable to get out many secretions. Patient's lung sounds on auscultation were diminished with little air movement, not pulling volumes on vent. Dr. Erin Fulling and RT called to bedside. RT bagged patient and was able to open airway at that time. Verbal order received from Dr. Erin Fulling to give 73mcg of Fentanyl, 2mg  of Versed, and 125mg  of solumedrol. STAT CXR obtained.

## 2020-10-17 DIAGNOSIS — J9601 Acute respiratory failure with hypoxia: Secondary | ICD-10-CM | POA: Diagnosis not present

## 2020-10-17 LAB — CULTURE, RESPIRATORY W GRAM STAIN: Gram Stain: NONE SEEN

## 2020-10-17 LAB — GLUCOSE, CAPILLARY
Glucose-Capillary: 123 mg/dL — ABNORMAL HIGH (ref 70–99)
Glucose-Capillary: 125 mg/dL — ABNORMAL HIGH (ref 70–99)
Glucose-Capillary: 125 mg/dL — ABNORMAL HIGH (ref 70–99)
Glucose-Capillary: 127 mg/dL — ABNORMAL HIGH (ref 70–99)
Glucose-Capillary: 136 mg/dL — ABNORMAL HIGH (ref 70–99)
Glucose-Capillary: 144 mg/dL — ABNORMAL HIGH (ref 70–99)

## 2020-10-17 LAB — BASIC METABOLIC PANEL
Anion gap: 7 (ref 5–15)
BUN: 25 mg/dL — ABNORMAL HIGH (ref 8–23)
CO2: 25 mmol/L (ref 22–32)
Calcium: 8.1 mg/dL — ABNORMAL LOW (ref 8.9–10.3)
Chloride: 106 mmol/L (ref 98–111)
Creatinine, Ser: 0.71 mg/dL (ref 0.61–1.24)
GFR, Estimated: >60 mL/min (ref >60)
Glucose, Bld: 138 mg/dL — ABNORMAL HIGH (ref 70–99)
Potassium: 3.9 mmol/L (ref 3.5–5.1)
Sodium: 138 mmol/L (ref 135–145)

## 2020-10-17 LAB — CBC
HCT: 19.2 % — ABNORMAL LOW (ref 39.0–52.0)
Hemoglobin: 6.5 g/dL — CL (ref 13.0–17.0)
MCH: 31.6 pg (ref 26.0–34.0)
MCHC: 33.9 g/dL (ref 30.0–36.0)
MCV: 93.2 fL (ref 80.0–100.0)
Platelets: 84 10*3/uL — ABNORMAL LOW (ref 150–400)
RBC: 2.06 MIL/uL — ABNORMAL LOW (ref 4.22–5.81)
RDW: 18.3 % — ABNORMAL HIGH (ref 11.5–15.5)
WBC: 7 10*3/uL (ref 4.0–10.5)
nRBC: 0 % (ref 0.0–0.2)

## 2020-10-17 LAB — PHOSPHORUS: Phosphorus: 2.5 mg/dL (ref 2.5–4.6)

## 2020-10-17 LAB — MAGNESIUM: Magnesium: 2.3 mg/dL (ref 1.7–2.4)

## 2020-10-17 LAB — PREPARE RBC (CROSSMATCH)

## 2020-10-17 LAB — ABO/RH: ABO/RH(D): O POS

## 2020-10-17 MED ORDER — OSMOLITE 1.5 CAL PO LIQD
1000.0000 mL | ORAL | Status: DC
  Administered 2020-10-17 – 2020-10-18 (×2): 1000 mL

## 2020-10-17 MED ORDER — SODIUM CHLORIDE 0.9% IV SOLUTION: Freq: Once | INTRAVENOUS | Status: AC

## 2020-10-17 MED ORDER — ONDANSETRON HCL 4 MG/2ML IJ SOLN
4.0000 mg | Freq: Once | INTRAMUSCULAR | Status: AC
  Administered 2020-10-17: 4 mg via INTRAVENOUS
  Filled 2020-10-17: qty 2

## 2020-10-17 MED ORDER — PROSOURCE TF PO LIQD
45.0000 mL | Freq: Two times a day (BID) | ORAL | Status: DC
Start: 2020-10-17 — End: 2020-10-26
  Administered 2020-10-17 – 2020-10-26 (×18): 45 mL
  Filled 2020-10-17 (×17): qty 45

## 2020-10-17 MED ORDER — CHLORHEXIDINE GLUCONATE 0.12 % MT SOLN
OROMUCOSAL | Status: AC
  Administered 2020-10-17: 15 mL via OROMUCOSAL
  Filled 2020-10-17: qty 15

## 2020-10-17 MED ORDER — PREDNISONE 5 MG/5ML PO SOLN: 40.0000 mg | Freq: Every day | ORAL | Status: DC

## 2020-10-17 MED ORDER — PREDNISONE 20 MG PO TABS
40.0000 mg | ORAL_TABLET | Freq: Every day | ORAL | Status: AC
  Administered 2020-10-17 – 2020-10-20 (×4): 40 mg
  Filled 2020-10-17 (×4): qty 2

## 2020-10-17 MED ORDER — ORAL CARE MOUTH RINSE
15.0000 mL | Freq: Two times a day (BID) | OROMUCOSAL | Status: DC
  Administered 2020-10-18 – 2020-11-04 (×33): 15 mL via OROMUCOSAL

## 2020-10-17 NOTE — Progress Notes (Signed)
Pt extubated as ordered, and placed on 2L nasal cannula. RN is at bedside.

## 2020-10-17 NOTE — Progress Notes (Addendum)
Physical Therapy Treatment Patient Details Name: [name] MRN: [id number] DOB: Apr 10, 1951 Today's Date: 10/17/2020    History of Present Illness 70 yo admitted 3/30 after fall and found by brother with unknown down time (1-3 days). Fractured L frontal sinus,L L3 transverse process, displaced angulated mildly comminuted L femoral neck fx. AKI with rhabdo. 3/31 pt with hypoxia requiring intubation, extubated 4/1. Pt s/p left anterior hip hemiarthroplasty 4/1. Re-intubated 4/4 for hypoxemia and transfered to ICU. PMhx:hx tobacco abuse, CAD, COPD, Small cell carcinoma mets to lung liver    PT Comments    Pt remains intubated but alert and responsive, consistently following simple commands. RN present in room at beginning of session. Bilat wrist restraints and mitts removed. Pt performed LE exercises supine. Bed transitioned to chair position. Upon attaining full upright sitting with bed support, pt had prolonged coughing episode requiring 2-3 trials of suctioning by RN.  Once stable, pt performed LE exercises in sitting position. Pt very fatigue following coughing. Able to tolerate approx 5-10 minutes in chair position. Bed returned to original position with HOB at 30 degrees, restraints reapplied.    Follow Up Recommendations  SNF;Supervision/Assistance - 24 hour     Equipment Recommendations  Rolling walker with 5" wheels;3in1 (PT)    Recommendations for Other Services       Precautions / Restrictions Precautions Precautions: Fall Restrictions Weight Bearing Restrictions: Yes LLE Weight Bearing: Weight bearing as tolerated Other Position/Activity Restrictions: s/p L THA, anterior approach, no precautions    Mobility  Bed Mobility               General bed mobility comments: Bed transitioned to chair position.    Transfers                    Ambulation/Gait                 Stairs             Wheelchair Mobility    Modified Rankin (Stroke  Patients Only)       Balance                                            Cognition Arousal/Alertness: Awake/alert Behavior During Therapy: Flat affect Overall Cognitive Status: Difficult to assess                                 General Comments: following simple commands consistently      Exercises General Exercises - Lower Extremity Ankle Circles/Pumps: AROM;Both;10 reps Short Arc Quad: AROM;Right;Left;5 reps Heel Slides: AROM;Right;Left;10 reps;AAROM Hip ABduction/ADduction: AROM;AAROM;Both;5 reps Hip Flexion/Marching: AROM;AAROM;Right;Left;5 reps    General Comments        Pertinent Vitals/Pain Pain Assessment: Faces Faces Pain Scale: Hurts a little bit Pain Location: generalized with mobility Pain Descriptors / Indicators: Discomfort;Grimacing Pain Intervention(s): Limited activity within patient's tolerance;Monitored during session;Repositioned    Home Living                      Prior Function            PT Goals (current goals can now be found in the care plan section) Acute Rehab PT Goals Patient Stated Goal: unable to state (intubated) Progress towards PT goals: Not progressing toward goals - comment (  medical decliner requiring re-intubation)    Frequency    Min 3X/week      PT Plan Current plan remains appropriate    Co-evaluation              AM-PAC PT "6 Clicks" Mobility   Outcome Measure  Help needed turning from your back to your side while in a flat bed without using bedrails?: A Lot Help needed moving from lying on your back to sitting on the side of a flat bed without using bedrails?: A Lot Help needed moving to and from a bed to a chair (including a wheelchair)?: Total Help needed standing up from a chair using your arms (e.g., wheelchair or bedside chair)?: Total Help needed to walk in hospital room?: Total Help needed climbing 3-5 steps with a railing? : Total 6 Click Score: 8     End of Session Equipment Utilized During Treatment: Oxygen (vent) Activity Tolerance: Patient limited by fatigue Patient left: in bed;with call bell/phone within reach;with bed alarm set;with restraints reapplied Nurse Communication: Mobility status;Weight bearing status;Precautions PT Visit Diagnosis: Other abnormalities of gait and mobility (R26.89);Difficulty in walking, not elsewhere classified (R26.2);Muscle weakness (generalized) (M62.81)     Time: 8250-0370 PT Time Calculation (min) (ACUTE ONLY): 33 min  Charges:  $Therapeutic Exercise: 8-22 mins $Therapeutic Activity: 8-22 mins                     Lorrin Goodell, PT  Office # [phone] Pager [phone]    [name] 10/17/2020, 9:59 AM

## 2020-10-17 NOTE — Procedures (Signed)
Cortrak  Person Inserting Tube:  [name], RD Tube Type:  Cortrak - 43 inches Tube Location:  Right nare Initial Placement:  Stomach Secured by: Bridle Technique Used to Measure Tube Placement:  Documented cm marking at nare/ corner of mouth Cortrak Secured At:  90 cm    No x-ray is required. RN may begin using tube.   If the tube becomes dislodged please keep the tube and contact the Cortrak team at [email or web address] ([email or web address]) for replacement.  If after hours and replacement cannot be delayed, place a NG tube and confirm placement with an abdominal x-ray.    [name] RD, LDN Clinical Nutrition Pager listed in [place]

## 2020-10-17 NOTE — Progress Notes (Signed)
[place] Progress Note Patient Name: [name] DOB: [birth year] MRN: [id number]   Date of Service  10/17/2020  HPI/Events of Note  Agitation - Patient grabbing at ETT. Nursing request for restraint order.  eICU Interventions  Plan: 1. Bilateral soft wrist restraints X 9 hours.      Intervention Category Major Interventions: Delirium, psychosis, severe agitation - evaluation and management  [name] [place] 10/17/2020, 12:26 AM

## 2020-10-17 NOTE — Progress Notes (Addendum)
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 Found by brother 3/30 after he had been unable to reach pt-- unknown period of time down (1-3days suspected). Fractured L frontal sinus,L L3 transverse process, displaced angulated mildly comminuted L femoral neck fx. AKI with rhabdo.   On 3/31 pt worsening hypoxia and respiratory distress >> intubated Extubated 4/2 and transferred out of ICU on 4/3. The evening of 4/3-4/4 he developed increased work of breathing and increasing oxygen needs requiring intubation. Suspected to have an aspiration event.   Pertinent  Medical History  CAD MI COPD Metastatic small call lung cancer Anxiety Tobacco use    Significant Hospital Events: Including procedures, antibiotic start and stop dates in addition to other pertinent events   . 3/30 admitted to [facility] for encephalopathy. Facial fx, L3 TP fx, Hip fx.  . 3/31 non op from [place] standpoint. Ortho planned to OR 3/31 however acute hypoxic respiratory failure, transferred to ICU and emergently intubated.    3/31 ETT >>4/1 331 bronchoscopy with BAL left lower lobe  4/1 LT  Hip Hemiarthroplasty (Anterior Approach)  4/4 ETT>>  Interim History / Subjective:   Patient had episodes of vomiting yesterday morning which precluded him from extubation. In the afternoon he developed a mucous plug leading to respiratory distress. This was cleared with lavage and suction by RT.   He had a bowel movement yesterday and is feeling better. He denies any pain right now. He is being placed on PSV.  Objective   Blood pressure (!) 126/51, pulse 67, temperature 99.6 F (37.6 C),  temperature source Oral, resp. rate 15, height _0  (1.753 m), weight 67 kg, SpO2 100 %.    Vent Mode: PRVC FiO2 (%):  [40 %-100 %] 40 % Set Rate:  [24 bmp] 24 bmp Vt Set:  [560 mL] 560 mL PEEP:  [5 cmH20] 5 cmH20 Pressure Support:  [5 cmH20] 5 cmH20 Plateau Pressure:  [16 cmH20] 16 cmH20   Intake/Output Summary (Last 24 hours) at 10/17/2020 0801 Last data filed at 10/17/2020 0600 Gross per 24 hour  Intake 484.32 ml  Output 755 ml  Net -270.68 ml   Filed Weights   10/15/20 0630 10/16/20 0400 10/17/20 0154  Weight: 66.3 kg 67.7 kg 67 kg    Examination: General: Chronically ill appearing. No acute distress. HENT: Temporal muscle wasting. L frontal/periorbital ecchymosis and edema.  Left subconjunctival hemorrhage Lungs: diminished breath sounds bilaterally. Cardiovascular: rrr, no murmurs Abdomen: soft, non-distended, BS+ Extremities: Clubbing. No pitting edema. No cyanosis Neuro: moving all extremities, alert and awake following commands. GU: condom cath   Labs/imaging that I havepersonally reviewed  (right click and "Reselect all SmartList Selections" daily)   3/30 CT head/chest abdomen pelvis with L frontal sinus fx, L3 left transverse process fx, L femoral neck fx , multiple nodules stable from CT February, aspiration debris and bronchi 3/31 BAL culture >> ng  4/4 CTA Chest: No pulmonary emboli. Diffuse centrilobular emphysema. Secretions present in the bilateral lower lobe and middle lobe airways.   4/5 KUB: non-obstructive bowel gas pattern present. Improved aeration of left lower lobe.   4/5 CXR - air space consolidation left lower  lobe. Emphysematous changes.  Resolved Hospital Problem list     Assessment & Plan:   Acute Hypoxemic respiratory failure Aspiration Pneumonia COPD/Emphysema Mucous Plug 4/5 Hx of Small Cell Lung Cancer s/p chemotherapy with lurbinectedin, last cycle on 09/12/20 Plan - continue mechanical ventilatory support - PSV trial this AM and  if does well will extubate today - Will need speech evaluation and cortrak placement. - Completed 7 days of cefepime on 4/5 - Brovana/Yupelri nebulizer treatments and PRN albuterol nebs - Given 178m solumedrol yesterday during mucous plug event, will continue 446mof prednisone for 4 days for COPD exacerbation  Nausea/Vomiting In setting of constipation, non-obstructive bowel gas pattern on KUB Plan: - dulcolax suppository yesterday with bowel movement - Continue docusate and miralax daily - PRN zofran  Acute encephalopathy - improving Likely in setting of acute respiratory failure due to aspiration.  Plan: - Continue reduced gabapentin to 10031mID from 300m31m- Hold home ativan.    Metastatic small cell carcinoma -lungs, liver  -follows with Dr katrDelton Coombes on 13th cycle of chemo (aloxi) -Head CT has not shown brain mets  L femoral neck Fx status post hemiarthroplasty -ortho following , okay to mobilize  L facial fx L3 TP fx -NSGY evaluated, no surgical intervention indicated   Oliguria Rhabdomyolysis -resolved CK decreased to 556 from 4k  CAD  -resume home ASA  Thrombocytopenia Anemia In setting of acute on chronic illness - transfuse 1 unit PRBCs today 4/6 for hemoglobin <7g/dL  Tobacco abuse disorder  -cessation counseling when able  Best practice (right click and "Reselect all SmartList Selections" daily)  Diet:  NPO until speech evaluation, will likely need cortrack for TF Pain/Anxiety/Delirium protocol (if indicated): Yes (RASS goal 0) VAP protocol (if indicated): Yes DVT prophylaxis: Subcutaneous Heparin GI prophylaxis: PPI Glucose control:  SSI Yes Central venous access:  Yes, and it is still needed Arterial line:  N/A Foley:  N/A Mobility:  bed rest  PT consulted: N/A Last date of multidisciplinary goals of care discussion [Update brother KerrLevada Dy/22] Code Status:  full code Disposition: ICU   Labs   CBC: Recent Labs  Lab 10/13/20 0451  10/13/20 1605 10/14/20 0339 10/15/20 0303 10/15/20 0449 10/15/20 0605 10/16/20 0150 10/16/20 0434  WBC 10.2 11.1* 8.8  --  9.9  --   --  7.0  NEUTROABS  --  8.9*  --   --   --   --   --   --   HGB 8.4* 9.3* 9.3* 9.9* 9.1* 8.2* 8.5* 8.8*  HCT 25.0* 27.9* 26.6* 29.0* 27.9* 24.0* 25.0* 26.7*  MCV 96.2 95.5 92.4  --  98.6  --   --  96.4  PLT 77* 79* 88*  --  94*  --   --  94*    Basic Metabolic Panel: Recent Labs  Lab 10/13/20 0451 10/14/20 0339 10/15/20 0303 10/15/20 0449 10/15/20 0605 10/15/20 1654 10/16/20 0044 10/16/20 0150 10/16/20 0434 10/16/20 1704 10/17/20 0430  NA 135 141   < > 141 142  --  139 143 140  --   --   K 3.0* 3.0*   < > 4.3 4.8  --  3.7 4.0 3.8  --   --   CL 110 112*  --  110  --   --  110  --  109  --   --   CO2 20* 23  --  24  --   --  25  --  24  --   --  GLUCOSE 133* 131*  --  178*  --   --  121*  --  123*  --   --   BUN 20 14  --  16  --   --  20  --  21  --   --   CREATININE 0.80 0.90  --  1.22  --   --  0.86  --  0.84  --   --   CALCIUM 7.9* 8.3*  --  8.2*  --   --  8.0*  --  8.0*  --   --   MG 1.8  --   --   --   --  1.8 2.0  --  1.8 2.3 2.3  PHOS 1.5*  --   --   --   --  1.7*  --   --  2.5 4.0 2.5   < > = values in this interval not displayed.   GFR: Estimated Creatinine Clearance: 78.7 mL/min (by C-G formula based on SCr of 0.84 mg/dL). Recent Labs  Lab 10/10/20 1600 10/10/20 1602 10/10/20 1825 10/11/20 0247 10/11/20 1141 10/11/20 1345 10/12/20 0537 10/13/20 1605 10/14/20 0339 10/15/20 0449 10/16/20 0434  WBC  --    < >  --    < >  --   --    < > 11.1* 8.8 9.9 7.0  LATICACIDVEN 2.6*  --  2.8*  --  2.7* 0.7  --   --   --   --   --    < > = values in this interval not displayed.    Liver Function Tests: Recent Labs  Lab 10/12/20 0537 10/13/20 0451 10/14/20 0339 10/15/20 0449 10/16/20 0434  AST 47* 45* 44* 42* 40  ALT 59* 55* 52* 47* 42  ALKPHOS 40 42 76 80 69  BILITOT 1.2 1.0 1.2 0.8 0.6  PROT 5.3* 5.1* 5.2* 5.2* 5.1*   ALBUMIN 2.7* 2.3* 2.3* 2.1* 2.1*   Recent Labs  Lab 10/10/20 1602  LIPASE 27   No results for input(s): AMMONIA in the last 168 hours.  ABG    Component Value Date/Time   PHART 7.403 10/16/2020 0150   PCO2ART 36.1 10/16/2020 0150   PO2ART 66 (L) 10/16/2020 0150   HCO3 22.5 10/16/2020 0150   TCO2 24 10/16/2020 0150   ACIDBASEDEF 2.0 10/16/2020 0150   O2SAT 93.0 10/16/2020 0150     Coagulation Profile: Recent Labs  Lab 10/10/20 1602  INR 1.2    Cardiac Enzymes: Recent Labs  Lab 10/10/20 1602 10/11/20 1141 10/12/20 0537  CKTOTAL 4,232* 1,873* 556*    HbA1C: Hgb A1c MFr Bld  Date/Time Value Ref Range Status  10/20/2013 01:12 PM 5.6 <5.7 % Final    Comment:                                                                           According to the ADA Clinical Practice Recommendations for 2011, when HbA1c is used as a screening test:     >=6.5%   Diagnostic of Diabetes Mellitus            (if abnormal result is confirmed)   5.7-6.4%   Increased risk of developing Diabetes Mellitus   References:Diagnosis and  Classification of Diabetes Mellitus,Diabetes FBPZ,0258,52(DPOEU 1):S62-S69 and Standards of Medical Care in         Diabetes - 2011,Diabetes MPNT,6144,31 (Suppl 1):S11-S61.      CBG: Recent Labs  Lab 10/16/20 1114 10/16/20 1551 10/16/20 1951 10/16/20 2335 10/17/20 0344  GLUCAP 118* 165* 156* 152* 144*    Critical Care Time: 35 minutes  Freda Jackson, MD [place] [facility] Office: [phone]   See Amion for personal pager PCCM on call pager [phone] until 7pm. Please call Elink 7p-7a. [phone]

## 2020-10-17 NOTE — Progress Notes (Signed)
Nutrition Follow-up  DOCUMENTATION CODES:   Severe malnutrition in context of chronic illness  INTERVENTION:   Initiate tube feeds via Cortrak: - Start Osmolite 1.5 @ 20 ml/hr and advance by 10 ml q 8 hours to goal rate of 50 ml/hr (1200 ml/day) - ProSource TF 45 ml BID  Tube feeding regimen at goal provides 1880 kcal, 97 grams of protein, and 914 ml of H2O.   NUTRITION DIAGNOSIS:   Severe Malnutrition related to chronic illness (small cell carcinoma of the lung) as evidenced by severe muscle depletion,severe fat depletion.  Ongoing, being addressed via TF  GOAL:   Patient will meet greater than or equal to 90% of their needs  Met via TF at goal rate  MONITOR:   Diet advancement,Labs,TF tolerance,Weight trends,Skin  REASON FOR ASSESSMENT:   Consult Enteral/tube feeding initiation and management  ASSESSMENT:   69-year-old male who presented to the ED on 3/30 with AMS after a fall. Pt with unknown time down (1-3 days suspected). 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 Pt found to have left hip fracture, left L3 transverse process fracture, left frontal sinus fracture as well as rhabdomyolysis.  3/31 - rapid response for respiratory distress, transferred to ICU, intubated, therapeutic bronchoscopy 4/01 - s/p left hip hemiarthroplasty, application of incisional VAC, extubated 4/04 - rapid response due to acute hypoxemic respiratory failure, transferred to ICU, intubated 4/05 - pt vomited, TF held 4/06 - Cortrak placed, extubated  Discussed pt with RN and during ICU rounds. Cortrak placed this morning. Consult received for tube feeding initiation and management.  Met with pt briefly at bedside. Pt resting at time of RD visit. Explained plan for tube feeds. Pt expresses understanding.  Medications reviewed and include: colace, pepcid, remeron, miralax, prednisone  Labs  reviewed: hemoglobin 6.5 CBG's: 127-165 x 24 hours  UOP: 755 ml x 24 hours I/O's: +5.9 L since admit  Diet Order:   Diet Order    None      EDUCATION NEEDS:   No education needs have been identified at this time  Skin:  Skin Assessment: Skin Integrity Issues: DTI: sacrum, right heel Incisions: left hip  Last BM:  10/16/20  Height:   Ht Readings from Last 1 Encounters:  10/11/20 5' 9" (1.753 m)    Weight:   Wt Readings from Last 1 Encounters:  10/17/20 67 kg    BMI:  Body mass index is 21.81 kg/m.  Estimated Nutritional Needs:   Kcal:  1800-2000  Protein:  85-100 grams  Fluid:  >/= 1.8 L    Kate Holmes, MS, RD, LDN Inpatient Clinical Dietitian Please see AMiON for contact information. 

## 2020-10-18 DIAGNOSIS — J9601 Acute respiratory failure with hypoxia: Secondary | ICD-10-CM | POA: Diagnosis not present

## 2020-10-18 LAB — TYPE AND SCREEN
ABO/RH(D): O POS
Antibody Screen: NEGATIVE
Unit division: 0

## 2020-10-18 LAB — CBC
HCT: 21.8 % — ABNORMAL LOW (ref 39.0–52.0)
Hemoglobin: 7.4 g/dL — ABNORMAL LOW (ref 13.0–17.0)
MCH: 31.6 pg (ref 26.0–34.0)
MCHC: 33.9 g/dL (ref 30.0–36.0)
MCV: 93.2 fL (ref 80.0–100.0)
Platelets: 104 10*3/uL — ABNORMAL LOW (ref 150–400)
RBC: 2.34 MIL/uL — ABNORMAL LOW (ref 4.22–5.81)
RDW: 18.5 % — ABNORMAL HIGH (ref 11.5–15.5)
WBC: 6.6 10*3/uL (ref 4.0–10.5)
nRBC: 0.3 % — ABNORMAL HIGH (ref 0.0–0.2)

## 2020-10-18 LAB — GLUCOSE, CAPILLARY
Glucose-Capillary: 116 mg/dL — ABNORMAL HIGH (ref 70–99)
Glucose-Capillary: 117 mg/dL — ABNORMAL HIGH (ref 70–99)
Glucose-Capillary: 118 mg/dL — ABNORMAL HIGH (ref 70–99)
Glucose-Capillary: 124 mg/dL — ABNORMAL HIGH (ref 70–99)
Glucose-Capillary: 140 mg/dL — ABNORMAL HIGH (ref 70–99)
Glucose-Capillary: 163 mg/dL — ABNORMAL HIGH (ref 70–99)

## 2020-10-18 LAB — BASIC METABOLIC PANEL
Anion gap: 4 — ABNORMAL LOW (ref 5–15)
BUN: 30 mg/dL — ABNORMAL HIGH (ref 8–23)
CO2: 28 mmol/L (ref 22–32)
Calcium: 8.3 mg/dL — ABNORMAL LOW (ref 8.9–10.3)
Chloride: 107 mmol/L (ref 98–111)
Creatinine, Ser: 0.75 mg/dL (ref 0.61–1.24)
GFR, Estimated: >60 mL/min (ref >60)
Glucose, Bld: 114 mg/dL — ABNORMAL HIGH (ref 70–99)
Potassium: 3.3 mmol/L — ABNORMAL LOW (ref 3.5–5.1)
Sodium: 139 mmol/L (ref 135–145)

## 2020-10-18 LAB — BPAM RBC
Blood Product Expiration Date: 202205062359
ISSUE DATE / TIME: 202204061437
Unit Type and Rh: 5100

## 2020-10-18 LAB — PHOSPHORUS: Phosphorus: 2.3 mg/dL — ABNORMAL LOW (ref 2.5–4.6)

## 2020-10-18 LAB — HEMOGLOBIN A1C
Hgb A1c MFr Bld: 5.8 % — ABNORMAL HIGH (ref 4.8–5.6)
Mean Plasma Glucose: 119.76 mg/dL

## 2020-10-18 LAB — MAGNESIUM: Magnesium: 2.3 mg/dL (ref 1.7–2.4)

## 2020-10-18 MED ORDER — POTASSIUM CHLORIDE 20 MEQ PO PACK
20.0000 meq | PACK | ORAL | Status: AC
  Administered 2020-10-18 (×2): 20 meq
  Filled 2020-10-18 (×2): qty 1

## 2020-10-18 MED ORDER — INSULIN ASPART 100 UNIT/ML ~~LOC~~ SOLN
0.0000 [IU] | SUBCUTANEOUS | Status: DC
  Administered 2020-10-18 – 2020-10-19 (×2): 2 [IU] via SUBCUTANEOUS
  Administered 2020-10-19 (×2): 1 [IU] via SUBCUTANEOUS
  Administered 2020-10-20: 2 [IU] via SUBCUTANEOUS
  Administered 2020-10-20 – 2020-10-25 (×15): 1 [IU] via SUBCUTANEOUS
  Administered 2020-10-25: 2 [IU] via SUBCUTANEOUS
  Administered 2020-10-25 – 2020-10-27 (×9): 1 [IU] via SUBCUTANEOUS

## 2020-10-18 MED ORDER — POTASSIUM CHLORIDE 10 MEQ/50ML IV SOLN
10.0000 meq | INTRAVENOUS | Status: AC
  Administered 2020-10-18 (×4): 10 meq via INTRAVENOUS
  Filled 2020-10-18 (×4): qty 50

## 2020-10-18 MED ORDER — TAMSULOSIN HCL 0.4 MG PO CAPS: 0.4000 mg | ORAL_CAPSULE | Freq: Every day | ORAL | Status: DC

## 2020-10-18 MED ORDER — POTASSIUM PHOSPHATES 15 MMOLE/5ML IV SOLN
30.0000 mmol | Freq: Once | INTRAVENOUS | Status: AC
  Administered 2020-10-18: 30 mmol via INTRAVENOUS
  Filled 2020-10-18: qty 10

## 2020-10-18 MED ORDER — DOXAZOSIN MESYLATE 2 MG PO TABS
2.0000 mg | ORAL_TABLET | Freq: Every day | ORAL | Status: DC
  Administered 2020-10-18 – 2020-10-29 (×10): 2 mg
  Filled 2020-10-18 (×13): qty 1

## 2020-10-18 MED ORDER — SODIUM CHLORIDE 0.9 % IV SOLN
INTRAVENOUS | Status: DC | PRN
  Administered 2020-10-18: 250 mL via INTRAVENOUS

## 2020-10-18 NOTE — Progress Notes (Signed)
Pt refused CPT 

## 2020-10-18 NOTE — Progress Notes (Signed)
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 Found by brother 3/30 after he had been unable to reach pt-- unknown period of time down (1-3days suspected). Fractured L frontal sinus,L L3 transverse process, displaced angulated mildly comminuted L femoral neck fx. AKI with rhabdo.   On 3/31 pt worsening hypoxia and respiratory distress >> intubated Extubated 4/2 and transferred out of ICU on 4/3. The evening of 4/3-4/4 he developed increased work of breathing and increasing oxygen needs requiring intubation. Suspected to have an aspiration event.   Pertinent  Medical History  CAD MI COPD Metastatic small call lung cancer Anxiety Tobacco use    Significant Hospital Events: Including procedures, antibiotic start and stop dates in addition to other pertinent events   . 3/30 admitted to [facility] for encephalopathy. Facial fx, L3 TP fx, Hip fx.  . 3/31 non op from [place] standpoint. Ortho planned to OR 3/31 however acute hypoxic respiratory failure, transferred to ICU and emergently intubated.    3/31 ETT >>4/1 331 bronchoscopy with BAL left lower lobe  4/1 LT  Hip Hemiarthroplasty (Anterior Approach)  4/4 ETT>>4/6  Interim History / Subjective:   Patient extubated yesterday and cortrak placed. He is tolerating tube feeds well. No further episodes of vomiting. He had BM yesterday.   No acute complaints.  Objective   Blood pressure (!) 145/55, pulse 68, temperature 98.7 F (37.1 C), temperature source Axillary, resp. rate 17, height _0  (1.753 m), weight 67.1 kg, SpO2 91 %.        Intake/Output Summary (Last 24 hours) at 10/18/2020 1147 Last data filed  at 10/18/2020 1000 Gross per 24 hour  Intake 1043.37 ml  Output 1399.8 ml  Net -356.43 ml   Filed Weights   10/16/20 0400 10/17/20 0154 10/18/20 0426  Weight: 67.7 kg 67 kg 67.1 kg    Examination: General: Chronically ill appearing. No acute distress HENT: Temporal muscle wasting. L frontal/periorbital ecchymosis and edema.  Left subconjunctival hemorrhage Lungs: bilateral rhonchi Cardiovascular: rrr, no murmurs Abdomen: soft, non-distended, BS+ Extremities: Clubbing. No pitting edema. No cyanosis Neuro: moving all extremities, alert and awake following commands. GU: condom cath   Labs/imaging that I havepersonally reviewed  (right click and "Reselect all SmartList Selections" daily)   3/30 CT head/chest abdomen pelvis with L frontal sinus fx, L3 left transverse process fx, L femoral neck fx , multiple nodules stable from CT February, aspiration debris and bronchi 3/31 BAL culture >> ng  4/4 CTA Chest: No pulmonary emboli. Diffuse centrilobular emphysema. Secretions present in the bilateral lower lobe and middle lobe airways.   4/5 KUB: non-obstructive bowel gas pattern present. Improved aeration of left lower lobe.   4/5 CXR - air space consolidation left lower lobe. Emphysematous changes.  Resolved Hospital Problem list     Assessment & Plan:   Acute Hypoxemic respiratory failure Aspiration Pneumonia COPD/Emphysema Mucous Plug 4/5 Hx of Small Cell Lung Cancer s/p chemotherapy with lurbinectedin, last cycle on 09/12/20 Plan - Extubated 4/6, doing well on nasal canula - Completed 7 days of cefepime on 4/5 - Brovana/Yupelri nebulizer treatments and PRN albuterol nebs - Given 162m solumedrol 4/5 during mucous plug event, will  continue 23m of prednisone for 4 days for COPD exacerbation - Chest PT per RT  Nausea/Vomiting In setting of constipation, non-obstructive bowel gas pattern on KUB Plan: - Continue docusate and miralax daily - PRN zofran  Acute encephalopathy  - improving Likely in setting of acute respiratory failure due to aspiration and ICU delirium Plan: - Continue reduced gabapentin to 1022mTID from 30063m - Hold home ativan.    Metastatic small cell carcinoma -lungs, liver  -follows with Dr katDelton Coombesead CT has not shown brain mets  L femoral neck Fx status post hemiarthroplasty -ortho following , okay to mobilize  L facial fx L3 TP fx -NSGY evaluated, no surgical intervention indicated   Oliguria Rhabdomyolysis -resolved CK decreased to 556 from 4k  CAD  -resume home ASA  Thrombocytopenia Anemia In setting of acute on chronic illness - transfuse 1 unit PRBCs today 4/6 for hemoglobin <7g/dL  Tobacco abuse disorder  -cessation counseling when able  Best practice (right click and "Reselect all SmartList Selections" daily)  Diet:  Tube feeds Pain/Anxiety/Delirium protocol (if indicated): N/A VAP protocol (if indicated): N/A DVT prophylaxis: Subcutaneous Heparin GI prophylaxis: PPI Glucose control:  SSI Yes Central venous access:  Yes, and it is still needed Arterial line:  N/A Foley:  N/A Mobility:  bed rest  PT consulted: N/A Last date of multidisciplinary goals of care discussion [Update brother KerLevada Dy4/22] Code Status:  full code Disposition: ICU   Labs   CBC: Recent Labs  Lab 10/13/20 1605 10/14/20 0339 10/15/20 0303 10/15/20 0449 10/15/20 0605 10/16/20 0150 10/16/20 0434 10/17/20 0857 10/18/20 0447  WBC 11.1* 8.8  --  9.9  --   --  7.0 7.0 6.6  NEUTROABS 8.9*  --   --   --   --   --   --   --   --   HGB 9.3* 9.3*   < > 9.1* 8.2* 8.5* 8.8* 6.5* 7.4*  HCT 27.9* 26.6*   < > 27.9* 24.0* 25.0* 26.7* 19.2* 21.8*  MCV 95.5 92.4  --  98.6  --   --  96.4 93.2 93.2  PLT 79* 88*  --  94*  --   --  94* 84* 104*   < > = values in this interval not displayed.    Basic Metabolic Panel: Recent Labs  Lab 10/15/20 0449 10/15/20 0605 10/15/20 1654 10/16/20 0044 10/16/20 0150 10/16/20 0434  10/16/20 1704 10/17/20 0430 10/17/20 0855 10/18/20 0447 10/18/20 0733  NA 141   < >  --  139 143 140  --   --  138 139  --   K 4.3   < >  --  3.7 4.0 3.8  --   --  3.9 3.3*  --   CL 110  --   --  110  --  109  --   --  106 107  --   CO2 24  --   --  25  --  24  --   --  25 28  --   GLUCOSE 178*  --   --  121*  --  123*  --   --  138* 114*  --   BUN 16  --   --  20  --  21  --   --  25* 30*  --   CREATININE 1.22  --   --  0.86  --  0.84  --   --  0.71 0.75  --   CALCIUM  8.2*  --   --  8.0*  --  8.0*  --   --  8.1* 8.3*  --   MG  --   --  1.8 2.0  --  1.8 2.3 2.3  --  2.3  --   PHOS  --   --  1.7*  --   --  2.5 4.0 2.5  --   --  2.3*   < > = values in this interval not displayed.   GFR: Estimated Creatinine Clearance: 82.7 mL/min (by C-G formula based on SCr of 0.75 mg/dL). Recent Labs  Lab 10/11/20 1345 10/12/20 0537 10/15/20 0449 10/16/20 0434 10/17/20 0857 10/18/20 0447  WBC  --    < > 9.9 7.0 7.0 6.6  LATICACIDVEN 0.7  --   --   --   --   --    < > = values in this interval not displayed.    Liver Function Tests: Recent Labs  Lab 10/12/20 0537 10/13/20 0451 10/14/20 0339 10/15/20 0449 10/16/20 0434  AST 47* 45* 44* 42* 40  ALT 59* 55* 52* 47* 42  ALKPHOS 40 42 76 80 69  BILITOT 1.2 1.0 1.2 0.8 0.6  PROT 5.3* 5.1* 5.2* 5.2* 5.1*  ALBUMIN 2.7* 2.3* 2.3* 2.1* 2.1*   No results for input(s): LIPASE, AMYLASE in the last 168 hours. No results for input(s): AMMONIA in the last 168 hours.  ABG    Component Value Date/Time   PHART 7.403 10/16/2020 0150   PCO2ART 36.1 10/16/2020 0150   PO2ART 66 (L) 10/16/2020 0150   HCO3 22.5 10/16/2020 0150   TCO2 24 10/16/2020 0150   ACIDBASEDEF 2.0 10/16/2020 0150   O2SAT 93.0 10/16/2020 0150     Coagulation Profile: No results for input(s): INR, PROTIME in the last 168 hours.  Cardiac Enzymes: Recent Labs  Lab 10/12/20 0537  CKTOTAL 556*    HbA1C: Hgb A1c MFr Bld  Date/Time Value Ref Range Status  10/20/2013  01:12 PM 5.6 <5.7 % Final    Comment:                                                                           According to the ADA Clinical Practice Recommendations for 2011, when HbA1c is used as a screening test:     >=6.5%   Diagnostic of Diabetes Mellitus            (if abnormal result is confirmed)   5.7-6.4%   Increased risk of developing Diabetes Mellitus   References:Diagnosis and Classification of Diabetes Mellitus,Diabetes WVPX,1062,69(SWNIO 1):S62-S69 and Standards of Medical Care in         Diabetes - 2011,Diabetes EVOJ,5009,38 (Suppl 1):S11-S61.      CBG: Recent Labs  Lab 10/17/20 1614 10/17/20 2006 10/17/20 2354 10/18/20 0340 10/18/20 0747  GLUCAP 136* 123* 125* 124* 118*    Freda Jackson, MD [place] [facility] Office: [phone]   See Amion for personal pager PCCM on call pager [phone] until 7pm. Please call Elink 7p-7a. [phone]

## 2020-10-18 NOTE — Progress Notes (Signed)
[facility] Progress Note Patient Name: [name] DOB: 01-Aug-1950 MRN: [id number]   Date of Service  10/18/2020  HPI/Events of Note  Agitation - Patient picking at lines. Nursing request for bilateral soft wrist restraints.   eICU Interventions  Plan: 1. Bilateral soft wrist restraints X 6 hours.      Intervention Category Major Interventions: Delirium, psychosis, severe agitation - evaluation and management  Treyvin Glidden [place] 10/18/2020, 3:59 AM

## 2020-10-18 NOTE — Progress Notes (Signed)
[name] DOB: 1951/03/17 MRN: [id number]   Date of Service  10/18/2020  HPI/Events of Note  Urinary retention - Patient I/O cathed 4+ times in last 24 hours. Nursing request for Foley catheter.   eICU Interventions  Plan: 1. Place Foley catheter.      Intervention Category Major Interventions: Other:  [name] [name] 10/18/2020, 4:04 AM

## 2020-10-18 NOTE — Progress Notes (Signed)
Physical Therapy Treatment Patient Details Name: [name] MRN: [id number] DOB: 15-Sep-1950 Today's Date: 10/18/2020    History of Present Illness Pt is 70 yo admitted 3/30 after fall and found by brother with unknown down time (1-3 days). Fractured L frontal sinus,L L3 transverse process, displaced angulated mildly comminuted L femoral neck fx. AKI with rhabdo. 3/31 pt with hypoxia requiring intubation, extubated 4/1. Pt s/p left anterior hip hemiarthroplasty 4/1. Re-intubated 4/4 for hypoxemia and transfered to ICU, extubated 10/17/20. 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  He was able to perform transfers and take a few steps.  He did required increased cues for safety and mod A of 2 for transfers to steady and for safety due to confusion.  Pt having frequent loose stools requiring increased time, transfers, and eventual return to bed.  Considering multiple intubations and medical issues - pt did well moving today and with good rehab potential.  All VSS.  Follow Up Recommendations  SNF;Supervision/Assistance - 24 hour     Equipment Recommendations  Rolling walker with 5" wheels;3in1 (PT)    Recommendations for Other Services       Precautions / Restrictions Precautions Precautions: Fall Precaution Comments: VAC; coretrak Restrictions LLE Weight Bearing: Weight bearing as tolerated Other Position/Activity Restrictions: s/p L hemi arthroplasty, anterior approach, no precautions    Mobility  Bed Mobility Overal bed mobility: Needs Assistance Bed Mobility: Supine to Sit;Sit to Supine     Supine to sit: Min assist;HOB elevated Sit to supine: Mod assist;+2 for physical assistance;+2 for safety/equipment   General bed mobility comments: Use of bed rails and cues to sit; return to supine with assist with trunk and legs    Transfers Overall transfer level: Needs  assistance Equipment used: 2 person hand held assist Transfers: Sit to/from Stand;Stand Pivot Transfers Sit to Stand: Mod assist;+2 physical assistance Stand pivot transfers: Mod assist;+2 physical assistance       General transfer comment: Pt requiring cues for safe hand placement and sequencing.  Performed sit to stand from bed , from recliner, and from [facility] with mod A of 2 HHA.  Stand pivot from recliner to [facility] then back to bed again with mod A of 2 for balance and cuesing  Ambulation/Gait Ambulation/Gait assistance: Mod assist;+2 physical assistance Gait Distance (Feet): 2 Feet Assistive device: 2 person hand held assist Gait Pattern/deviations: Step-to pattern;Decreased stride length;Shuffle;Narrow base of support Gait velocity: decreased   General Gait Details: Small steps to chair with HHA of 2.  Very unsteady requiring mod-max cues and mod A of 2 to balance   Stairs             Wheelchair Mobility    Modified Rankin (Stroke Patients Only)       Balance Overall balance assessment: Needs assistance Sitting-balance support: Feet supported;Bilateral upper extremity supported Sitting balance-Leahy Scale: Poor Sitting balance - Comments: requiring UE support and min A at EOB   Standing balance support: Bilateral upper extremity supported Standing balance-Leahy Scale: Poor Standing balance comment: Requiring min-mod A of 2 with bil HHA to stand                            Cognition Arousal/Alertness: Awake/alert Behavior During Therapy: WFL for tasks assessed/performed Overall Cognitive Status: Impaired/Different from baseline  Orientation Level: Disoriented to;Place;Time;Situation Current Attention Level: Sustained Memory: Decreased short-term memory Following Commands: Follows one step commands inconsistently Safety/Judgement: Decreased awareness of safety;Decreased awareness of deficits   Problem Solving: Slow  processing;Decreased initiation;Difficulty sequencing;Requires tactile cues;Requires verbal cues        Exercises      General Comments General comments (skin integrity, edema, etc.): Pt having multiple loose stools throughout session requring multiple transfers and cleaning.  Required return back to bed due to loose stool and nursing coming to place flexiseal      Pertinent Vitals/Pain Pain Assessment: No/denies pain    Home Living                      Prior Function            PT Goals (current goals can now be found in the care plan section) Acute Rehab PT Goals Patient Stated Goal: not stated PT Goal Formulation: With patient/family Time For Goal Achievement: 10/27/20 Potential to Achieve Goals: Fair Progress towards PT goals: Progressing toward goals    Frequency    Min 3X/week      PT Plan Current plan remains appropriate    Co-evaluation PT/OT/SLP Co-Evaluation/Treatment: Yes Reason for Co-Treatment: Complexity of the patient's impairments (multi-system involvement);Necessary to address cognition/behavior during functional activity;For patient/therapist safety PT goals addressed during session: Mobility/safety with mobility;Strengthening/ROM;Balance OT goals addressed during session: ADL's and self-care      AM-PAC PT "6 Clicks" Mobility   Outcome Measure  Help needed turning from your back to your side while in a flat bed without using bedrails?: A Lot Help needed moving from lying on your back to sitting on the side of a flat bed without using bedrails?: A Lot Help needed moving to and from a bed to a chair (including a wheelchair)?: Total Help needed standing up from a chair using your arms (e.g., wheelchair or bedside chair)?: Total Help needed to walk in hospital room?: Total Help needed climbing 3-5 steps with a railing? : Total 6 Click Score: 8    End of Session Equipment Utilized During Treatment: Oxygen;Gait belt Activity Tolerance:  Patient tolerated treatment well;Other (comment) (limited by loose stools) Patient left: in bed;with call bell/phone within reach;with nursing/sitter in room (nursing in to settle pt after placing flexiseal) Nurse Communication: Mobility status PT Visit Diagnosis: Other abnormalities of gait and mobility (R26.89);Difficulty in walking, not elsewhere classified (R26.2);Muscle weakness (generalized) (M62.81)     Time: 1829-9371 PT Time Calculation (min) (ACUTE ONLY): 53 min  Charges:  $Therapeutic Activity: 23-37 mins                     Abran Richard, PT Acute Rehab Services Pager [phone] [facility] [place] 10/18/2020, 2:29 PM

## 2020-10-18 NOTE — Progress Notes (Signed)
Occupational Therapy Treatment Patient Details Name: [name] MRN: [id number] DOB: 1950-10-24 Today's Date: 10/18/2020    History of present illness Pt is 70 yo admitted 3/30 after fall and found by brother with unknown down time (1-3 days). Fractured L frontal sinus,L L3 transverse process, displaced angulated mildly comminuted L femoral neck fx. AKI with rhabdo. 3/31 pt with hypoxia requiring intubation, extubated 4/1. Pt s/p left anterior hip hemiarthroplasty 4/1. Re-intubated 4/4 for hypoxemia and transfered to ICU, extubated 10/17/20. 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  He required mod A +2 for transfers to the recliner, but activity limited by several bouts of loose watery stool.  He demonstrates impaired cognition including decreased judgement, impaired problem solving, and decreased safety awareness.   Follow Up Recommendations  SNF;Supervision/Assistance - 24 hour    Equipment Recommendations  None recommended by OT    Recommendations for Other Services      Precautions / Restrictions Precautions Precautions: Fall Precaution Comments: VAC; coretrak Restrictions LLE Weight Bearing: Weight bearing as tolerated Other Position/Activity Restrictions: s/p L hemi arthroplasty, anterior approach, no precautions       Mobility Bed Mobility Overal bed mobility: Needs Assistance Bed Mobility: Supine to Sit;Sit to Supine     Supine to sit: Min assist;HOB elevated Sit to supine: Mod assist;+2 for physical assistance;+2 for safety/equipment   General bed mobility comments: Use of bed rails and cues to sit; return to supine with assist with trunk and legs    Transfers Overall transfer level: Needs assistance Equipment used: 2 person hand held assist Transfers: Stand Pivot Transfers;Sit to/from Stand Sit to Stand: Mod assist;+2 physical assistance;+2 safety/equipment Stand pivot transfers: Mod assist;+2  physical assistance;+2 safety/equipment       General transfer comment: Pt requiring cues for safe hand placement and sequencing.  Performed sit to stand from bed , from recliner, and from [facility] with mod A of 2 HHA.  Stand pivot from recliner to [facility] then back to bed again with mod A of 2 for balance and cuesing    Balance Overall balance assessment: Needs assistance Sitting-balance support: Feet supported;Bilateral upper extremity supported Sitting balance-Leahy Scale: Poor Sitting balance - Comments: requiring UE support and min A at EOB Postural control: Left lateral lean Standing balance support: Bilateral upper extremity supported Standing balance-Leahy Scale: Poor Standing balance comment: Requiring min-mod A of 2 with bil HHA to stand                           ADL either performed or assessed with clinical judgement   ADL Overall ADL's : Needs assistance/impaired                         Toilet Transfer: Moderate assistance;+2 for physical assistance;+2 for safety/equipment;Stand-pivot;BSC   Toileting- Clothing Manipulation and Hygiene: Total assistance;Sit to/from stand Toileting - Clothing Manipulation Details (indicate cue type and reason): Pt incontinent of several large loose stools             Vision       Perception     Praxis      Cognition Arousal/Alertness: Awake/alert Behavior During Therapy: WFL for tasks assessed/performed Overall Cognitive Status: Impaired/Different from baseline Area of Impairment: Orientation;Memory;Following commands;Safety/judgement;Problem solving                 Orientation Level: Disoriented to;Place;Time;Situation Current  Attention Level: Sustained Memory: Decreased short-term memory Following Commands: Follows one step commands inconsistently Safety/Judgement: Decreased awareness of safety;Decreased awareness of deficits   Problem Solving: Slow processing;Decreased initiation;Difficulty  sequencing;Requires tactile cues;Requires verbal cues General Comments: Pt is easily distracted and requires cues to follow commands consistently and for safety        Exercises     Shoulder Instructions       General Comments Pt having multiple loose stools throughout session requring multiple transfers and cleaning.  Required return back to bed due to loose stool and nursing coming to place flexiseal    Pertinent Vitals/ Pain       Pain Assessment: No/denies pain  Home Living                                          Prior Functioning/Environment              Frequency  Min 2X/week        Progress Toward Goals  OT Goals(current goals can now be found in the care plan section)  Progress towards OT goals: Progressing toward goals  Acute Rehab OT Goals Patient Stated Goal: not stated  Plan Discharge plan remains appropriate    Co-evaluation    PT/OT/SLP Co-Evaluation/Treatment: Yes Reason for Co-Treatment: Complexity of the patient's impairments (multi-system involvement);Necessary to address cognition/behavior during functional activity;For patient/therapist safety;To address functional/ADL transfers PT goals addressed during session: Mobility/safety with mobility;Strengthening/ROM;Balance OT goals addressed during session: ADL's and self-care      AM-PAC OT "6 Clicks" Daily Activity     Outcome Measure   Help from another person eating meals?: A Lot Help from another person taking care of personal grooming?: A Lot Help from another person toileting, which includes using toliet, bedpan, or urinal?: Total Help from another person bathing (including washing, rinsing, drying)?: A Lot Help from another person to put on and taking off regular upper body clothing?: A Lot Help from another person to put on and taking off regular lower body clothing?: Total 6 Click Score: 10    End of Session Equipment Utilized During Treatment: Gait belt  OT  Visit Diagnosis: Unsteadiness on feet (R26.81);Cognitive communication deficit (R41.841) Pain - Right/Left: Left Pain - part of body: Leg   Activity Tolerance Other (comment) (loose stool)   Patient Left in bed;with call bell/phone within reach;with family/visitor present;with bed alarm set;with nursing/sitter in room   Nurse Communication Mobility status        Time: 2637-8588 OT Time Calculation (min): 53 min  Charges: OT General Charges $OT Visit: 1 Visit OT Evaluation $OT Eval Moderate Complexity: 1 Mod OT Treatments $Self Care/Home Management : 8-22 mins  Nilsa Nutting., OTR/L Acute Rehabilitation Services Pager [phone] Office [phone]    Lucille Passy M 10/18/2020, 4:25 PM

## 2020-10-19 DIAGNOSIS — Z96642 Presence of left artificial hip joint: Secondary | ICD-10-CM

## 2020-10-19 DIAGNOSIS — M6282 Rhabdomyolysis: Secondary | ICD-10-CM | POA: Diagnosis not present

## 2020-10-19 LAB — CBC
HCT: 27.6 % — ABNORMAL LOW (ref 39.0–52.0)
Hemoglobin: 9.1 g/dL — ABNORMAL LOW (ref 13.0–17.0)
MCH: 30.8 pg (ref 26.0–34.0)
MCHC: 33 g/dL (ref 30.0–36.0)
MCV: 93.6 fL (ref 80.0–100.0)
Platelets: 146 10*3/uL — ABNORMAL LOW (ref 150–400)
RBC: 2.95 MIL/uL — ABNORMAL LOW (ref 4.22–5.81)
RDW: 18.4 % — ABNORMAL HIGH (ref 11.5–15.5)
WBC: 6.2 10*3/uL (ref 4.0–10.5)
nRBC: 0.8 % — ABNORMAL HIGH (ref 0.0–0.2)

## 2020-10-19 LAB — GLUCOSE, CAPILLARY
Glucose-Capillary: 115 mg/dL — ABNORMAL HIGH (ref 70–99)
Glucose-Capillary: 123 mg/dL — ABNORMAL HIGH (ref 70–99)
Glucose-Capillary: 132 mg/dL — ABNORMAL HIGH (ref 70–99)
Glucose-Capillary: 151 mg/dL — ABNORMAL HIGH (ref 70–99)
Glucose-Capillary: 113 mg/dL — ABNORMAL HIGH (ref 70–99)

## 2020-10-19 LAB — BASIC METABOLIC PANEL
Anion gap: 5 (ref 5–15)
BUN: 22 mg/dL (ref 8–23)
CO2: 28 mmol/L (ref 22–32)
Calcium: 8.3 mg/dL — ABNORMAL LOW (ref 8.9–10.3)
Chloride: 104 mmol/L (ref 98–111)
Creatinine, Ser: 0.68 mg/dL (ref 0.61–1.24)
GFR, Estimated: >60 mL/min (ref >60)
Glucose, Bld: 124 mg/dL — ABNORMAL HIGH (ref 70–99)
Potassium: 3.7 mmol/L (ref 3.5–5.1)
Sodium: 137 mmol/L (ref 135–145)

## 2020-10-19 MED ORDER — JEVITY 1.5 CAL/FIBER PO LIQD
1000.0000 mL | ORAL | Status: DC
  Administered 2020-10-19 – 2020-10-25 (×5): 1000 mL
  Filled 2020-10-19 (×10): qty 1000

## 2020-10-19 MED ORDER — FREE WATER
150.0000 mL | Status: DC
  Administered 2020-10-19 – 2020-10-30 (×65): 150 mL

## 2020-10-19 NOTE — Progress Notes (Signed)
Nutrition Follow-up  DOCUMENTATION CODES:   Severe malnutrition in context of chronic illness  INTERVENTION:   Tube feeding via Cortrak: - Change to Jevity 1.5 @ 50 ml/hr (1200 ml/day) - ProSource TF 45 ml BID - Add free water flushes of 150 ml q 4 hours  Tube feeding regimen provides 1880 kcal, 99 grams of protein, and 912 ml of H2O.  Total free water with flushes: 1812 ml  NUTRITION DIAGNOSIS:   Severe Malnutrition related to chronic illness (small cell carcinoma of the lung) as evidenced by severe muscle depletion,severe fat depletion.  Ongoing, being addressed via TF  GOAL:   Patient will meet greater than or equal to 90% of their needs  Met via TF  MONITOR:   Diet advancement,Labs,TF tolerance,Weight trends,Skin  REASON FOR ASSESSMENT:   Consult Enteral/tube feeding initiation and management (change tube feeding to help with diarrhea)  ASSESSMENT:   70 year old male who presented to the ED on 3/30 with AMS after a fall. Pt with unknown time down (1-3 days suspected). 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 Pt found to have left hip fracture, left L3 transverse process fracture, left frontal sinus fracture as well as rhabdomyolysis.  3/31 - rapid response for respiratory distress, transferred to ICU, intubated, therapeutic bronchoscopy 4/01 - s/p left hip hemiarthroplasty, application of incisional VAC, extubated 4/04 - rapid response due to acute hypoxemic respiratory failure, transferred to ICU, intubated 4/05 - pt vomited, TF held 4/06 - Cortrak placed, extubated 4/08 - pt pulled wound VAC off of left hip  RD consulted to change tube feeding formula to help with diarrhea. Pt with a rectal tube in place for mulitiple type 7 bowel movements. RD will change tube feeding to fiber-containing formula. Will also add free water flushes. Discussed pt with RN.  4/01  weight: 62.5 kg Current weight: 67.1 kg  Current TF: Osmolite 1.5 @ 50 ml/hr, ProSource TF 45 ml BID  Medications reviewed and include: pepcid, SSI q 4 hours, remeron, prednisone  Labs reviewed: hemoglobin 7.4 CBG's: 115-163 x 24 hours  UOP: 1498 ml x 24 hours I/O's: +5.7 L since admit  Diet Order:   Diet Order    None      EDUCATION NEEDS:   No education needs have been identified at this time  Skin:  Skin Assessment: Skin Integrity Issues: DTI: sacrum, right heel Incisions: left hip  Last BM:  10/19/20 rectal tube  Height:   Ht Readings from Last 1 Encounters:  10/11/20 _0  (1.753 m)    Weight:   Wt Readings from Last 1 Encounters:  10/18/20 67.1 kg     BMI:  Body mass index is 21.85 kg/m.  Estimated Nutritional Needs:   Kcal:  1800-2000  Protein:  85-100 grams  Fluid:  >/= 1.8 L    Gustavus Bryant, MS, RD, LDN Inpatient Clinical Dietitian Please see [facility] for contact information.

## 2020-10-19 NOTE — Progress Notes (Signed)
Patient arrived to unit in bilateral wrist restraints, coretrak intact and TF running 50 ML/ HR, IVF at [facility], foley intact, flex iseal intact, sutures present on left hip and covered with ABD pad. TELE connected, BP and VS obtained, VSS. Patient AOx2 (month and name)

## 2020-10-19 NOTE — Progress Notes (Addendum)
Patient pulled wound vac off of L hip site - while in bilateral wrist restraints. Unable to redress and apply due to nature of the dressing removed by patient. Site clean and dry with stiches intact. Abd pad and medipore tape applied. Patient educated on importance of keeping dressing in place due to risk of infection. Patient verbalized understanding. Will continue to monitor.   Antonieta Pert, RN  10/19/2020 0600

## 2020-10-19 NOTE — Progress Notes (Signed)
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  Patient was found by brother on 3/30 after he had been unable to reach patient for unknown period of time (1 to 3 days suspected).  Patient fracture left frontal sinus, L3 transverse process, displaced angulated mildly comminuted left femoral neck fracture.  AKI with rhabdomyolysis.  On 3/31 patient develop worsening hypoxia and respiratory distress, intubated.  Subsequently extubated 4/2 and transferred out of the ICU on 4/3.  The evening of 4/3--4/4 he developed increased work of breathing and increased oxygen needs requiring intubation.  This was thought to be related to an aspiration event.  Significant events: -3/30 admitted to [facility] for encephalopathy. Facial fx, L3 TP fx, Hip fx.  -3/31 non op from [place] standpoint. Ortho planned to OR 3/31 however acute hypoxic respiratory failure, transferred to ICU and emergently intubated.  -3/31 ETT >>4/1 -3/31 bronchoscopy with BAL left lower lobe -4/1 LT  Hip Hemiarthroplasty (Anterior Approach) -4/4 ETT>>4/6    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 [facility])   Pressure injury of skin   Closed fracture of left hip (HCC)   Protein-calorie malnutrition, severe   1-Acute Hypoxic Respiratory Failure, Aspiration Pneumonia, COPD Emphysema Mucus Plug 4/05. History of a small cell lung cancer status post chemotherapy with lurbinectedin last cycle 09/12/20. -Extubated 4\6 -Completed 7 days of cefepime 4/05. -Brovana/Yupelri nebulizer treatments and PRN albuterol.  -received Solumedrol on 4/05 during mucus plug event, prednisone for COPD.  -Patient  To be discharge on Incruse  and Dulera. On nebulizer while inpatient.  -Stable on Room Air.   2-Nausea vomiting: In setting of constipation, KUB nonobstructive bowel pattern  Nutrition;  On tube feeding.  Needs Speech Swallow evaluation.   GI, Diarrhea;  Stop laxatives.  Nutritionist will change formula.   Acute metabolic encephalopathy: Delirium Likely in the setting of acute respiratory failure, due to aspiration pneumonia and ICU delirium. Continue Gabapentin  was reduced to 100 mg 3 times daily from 300 mg  Metastatic small cell carcinoma -Lung, liver -Follows with Dr. Delton Coombes.  -CT head; No acute intracranial pathology   Left femoral Neck Fx S/P Hemiarthroplasty:  Ortho following.  Patient remove wound vac last night.  Nurse will page ortho and inform them. /  PT following.   Left Facial Fx, L 3 TP Fx:  Neurosurgery  evaluated , no surgical intervention needed.  Neurosurgery recommend ENT consult. I will consult ENT>  I spoke with ENT on called for [place] recommend Conservative management, Fracture is non displaced, does not need anything acute.   Rhabdomyolysis Oliguria; Resolved with IV fluids.   Thrombocytopenia, anemia; Platelet improving.  Hb stable.  Received PRBC 4/06  Tabbacco abuse. Needs counseling.   Sepsis was ruled out.   Pressure Injury 10/11/20 Sacrum Medial Deep Tissue Pressure Injury - Purple or maroon localized area of discolored intact skin or blood-filled blister due to damage of underlying soft tissue from pressure and/or shear. (Active)  10/11/20 0220  Location: Sacrum  Location Orientation: Medial  Staging: Deep Tissue Pressure Injury - Purple or maroon localized area of discolored intact skin or blood-filled blister due to damage of underlying soft tissue from pressure and/or shear.  Wound  Description (Comments):   Present on Admission: Yes     Pressure Injury 10/15/20 Heel Right Deep Tissue Pressure Injury - Purple or maroon localized area of discolored intact  skin or blood-filled blister due to damage of underlying soft tissue from pressure and/or shear. (Active)  10/15/20 0323  Location: Heel  Location Orientation: Right  Staging: Deep Tissue Pressure Injury - Purple or maroon localized area of discolored intact skin or blood-filled blister due to damage of underlying soft tissue from pressure and/or shear.  Wound Description (Comments):   Present on Admission: No     Nutrition Problem: Severe Malnutrition Etiology: chronic illness (small cell carcinoma of the lung)    Signs/Symptoms: severe muscle depletion,severe fat depletion    Interventions: Tube feeding,Prostat  Estimated body mass index is 21.85 kg/m as calculated from the following:   Height as of this encounter: _0  (1.753 m).   Weight as of this encounter: 67.1 kg.   DVT prophylaxis: Heparin  Code Status: Full Code Family Communication: No family at bedside.  Disposition Plan:  Status is: Inpatient  Remains inpatient appropriate because:IV treatments appropriate due to intensity of illness or inability to take PO   Dispo:  Patient From: Home  Planned Disposition: [place]  Medically stable for discharge: No          Consultants:   Neurosurgery  Ortho  CCM    Procedures:     Antimicrobials:    Subjective: He is alert, with soft restrain, remove wound vac last night.    Objective: Vitals:   10/19/20 0500 10/19/20 0600 10/19/20 0700 10/19/20 0735  BP: (!) 158/56 (!) 149/58 (!) 145/62   Pulse: 69 65 69   Resp: _1 Temp:      TempSrc:      SpO2: 98% 98% 100% 100%  Weight:      Height:        Intake/Output Summary (Last 24 hours) at 10/19/2020 0736 Last data filed at 10/19/2020 0700 Gross per 24 hour  Intake 2116.66 ml  Output 1498.6 ml  Net 618.06 ml   Filed Weights   10/16/20 0400 10/17/20 0154 10/18/20 0426  Weight: 67.7 kg 67 kg 67.1 kg    Examination:  General exam: Appears calm and comfortable ,  hematoma forehead.  Respiratory system: Clear to auscultation. Respiratory effort normal. Cardiovascular system: S1 & S2 heard, RRR.  Gastrointestinal system: Abdomen is nondistended, soft and nontender. No organomegaly or masses felt. Normal bowel sounds heard. Central nervous system: Alert Extremities: Symmetric 5 x 5 power.    Data Reviewed: I have personally reviewed following labs and imaging studies  CBC: Recent Labs  Lab 10/13/20 1605 10/14/20 0339 10/15/20 0303 10/15/20 0449 10/15/20 0605 10/16/20 0150 10/16/20 0434 10/17/20 0857 10/18/20 0447  WBC 11.1* 8.8  --  9.9  --   --  7.0 7.0 6.6  NEUTROABS 8.9*  --   --   --   --   --   --   --   --   HGB 9.3* 9.3*   < > 9.1* 8.2* 8.5* 8.8* 6.5* 7.4*  HCT 27.9* 26.6*   < > 27.9* 24.0* 25.0* 26.7* 19.2* 21.8*  MCV 95.5 92.4  --  98.6  --   --  96.4 93.2 93.2  PLT 79* 88*  --  94*  --   --  94* 84* 104*   < > = values in this interval not displayed.   Basic Metabolic Panel:  Recent Labs  Lab 10/15/20 1654 10/16/20 0044 10/16/20 0150 10/16/20 0434 10/16/20 1704 10/17/20 0430 10/17/20 0855 10/18/20 0447 10/18/20 0733 10/19/20 0544  NA  --  139 143 140  --   --  138 139  --  137  K  --  3.7 4.0 3.8  --   --  3.9 3.3*  --  3.7  CL  --  110  --  109  --   --  106 107  --  104  CO2  --  25  --  24  --   --  25 28  --  28  GLUCOSE  --  121*  --  123*  --   --  138* 114*  --  124*  BUN  --  20  --  21  --   --  25* 30*  --  22  CREATININE  --  0.86  --  0.84  --   --  0.71 0.75  --  0.68  CALCIUM  --  8.0*  --  8.0*  --   --  8.1* 8.3*  --  8.3*  MG 1.8 2.0  --  1.8 2.3 2.3  --  2.3  --   --   PHOS 1.7*  --   --  2.5 4.0 2.5  --   --  2.3*  --    GFR: Estimated Creatinine Clearance: 82.7 mL/min (by C-G formula based on SCr of 0.68 mg/dL). Liver Function Tests: Recent Labs  Lab 10/13/20 0451 10/14/20 0339 10/15/20 0449 10/16/20 0434  AST 45* 44* 42* 40  ALT 55* 52* 47* 42  ALKPHOS 42 76 80 69  BILITOT 1.0 1.2  0.8 0.6  PROT 5.1* 5.2* 5.2* 5.1*  ALBUMIN 2.3* 2.3* 2.1* 2.1*   No results for input(s): LIPASE, AMYLASE in the last 168 hours. No results for input(s): AMMONIA in the last 168 hours. Coagulation Profile: No results for input(s): INR, PROTIME in the last 168 hours. Cardiac Enzymes: No results for input(s): CKTOTAL, CKMB, CKMBINDEX, TROPONINI in the last 168 hours. BNP (last 3 results) No results for input(s): PROBNP in the last 8760 hours. HbA1C: Recent Labs    10/18/20 1640  HGBA1C 5.8*   CBG: Recent Labs  Lab 10/18/20 1205 10/18/20 1602 10/18/20 2016 10/18/20 2340 10/19/20 0329  GLUCAP 140* 163* 117* 116* 115*   Lipid Profile: No results for input(s): CHOL, HDL, LDLCALC, TRIG, CHOLHDL, LDLDIRECT in the last 72 hours. Thyroid Function Tests: No results for input(s): TSH, T4TOTAL, FREET4, T3FREE, THYROIDAB in the last 72 hours. Anemia Panel: No results for input(s): VITAMINB12, FOLATE, FERRITIN, TIBC, IRON, RETICCTPCT in the last 72 hours. Sepsis Labs: No results for input(s): PROCALCITON, LATICACIDVEN in the last 168 hours.  Recent Results (from the past 240 hour(s))  Resp Panel by RT-PCR (Flu A&B, Covid) Nasopharyngeal Swab     Status: None   Collection Time: 10/10/20  6:25 PM   Specimen: Nasopharyngeal Swab; Nasopharyngeal(NP) swabs in vial transport medium  Result Value Ref Range Status   SARS Coronavirus 2 by RT PCR NEGATIVE NEGATIVE Final    Comment: (NOTE) SARS-CoV-2 target nucleic acids are NOT DETECTED.  The SARS-CoV-2 RNA is generally detectable in upper respiratory specimens during the acute phase of infection. The lowest concentration of SARS-CoV-2 viral copies this assay can detect is 138 copies/mL. A negative result does not preclude SARS-Cov-2 infection and should not be used as the sole basis for treatment or other patient management decisions.  A negative result may occur with  improper specimen collection/handling, submission of specimen  other than nasopharyngeal swab, presence of viral mutation(s) within the areas targeted by this assay, and inadequate number of viral copies(<138 copies/mL). A negative result must be combined with clinical observations, patient history, and epidemiological information. The expected result is Negative.  Fact Sheet for Patients:  EntrepreneurPulse.com.au  Fact Sheet for Healthcare Providers:  IncredibleEmployment.be  This test is no t yet approved or cleared by the [place] FDA and  has been authorized for detection and/or diagnosis of SARS-CoV-2 by FDA under an Emergency Use Authorization (EUA). This EUA will remain  in effect (meaning this test can be used) for the duration of the COVID-19 declaration under Section 564(b)(1) of the Act, 21 U.S.C.section 360bbb-3(b)(1), unless the authorization is terminated  or revoked sooner.       Influenza A by PCR NEGATIVE NEGATIVE Final   Influenza B by PCR NEGATIVE NEGATIVE Final    Comment: (NOTE) The Xpert Xpress SARS-CoV-2/FLU/RSV plus assay is intended as an aid in the diagnosis of influenza from Nasopharyngeal swab specimens and should not be used as a sole basis for treatment. Nasal washings and aspirates are unacceptable for Xpert Xpress SARS-CoV-2/FLU/RSV testing.  Fact Sheet for Patients: EntrepreneurPulse.com.au  Fact Sheet for Healthcare Providers: IncredibleEmployment.be  This test is not yet approved or cleared by the [place] FDA and has been authorized for detection and/or diagnosis of SARS-CoV-2 by FDA under an Emergency Use Authorization (EUA). This EUA will remain in effect (meaning this test can be used) for the duration of the COVID-19 declaration under Section 564(b)(1) of the Act, 21 U.S.C. section 360bbb-3(b)(1), unless the authorization is terminated or revoked.  Performed at [facility], [place] [address], [place],  [place] [address]   Culture, blood (routine x 2)     Status: None   Collection Time: 10/11/20 10:53 AM   Specimen: BLOOD  Result Value Ref Range Status   Specimen Description BLOOD RIGHT ANTECUBITAL  Final   Special Requests   Final    BOTTLES DRAWN AEROBIC AND ANAEROBIC Blood Culture adequate volume   Culture   Final    NO GROWTH 5 DAYS Performed at [facility], [place] [address], [place], [place] [address]    Report Status 10/16/2020 FINAL  Final  Culture, blood (routine x 2)     Status: None   Collection Time: 10/11/20 11:00 AM   Specimen: BLOOD RIGHT HAND  Result Value Ref Range Status   Specimen Description BLOOD RIGHT HAND  Final   Special Requests   Final    BOTTLES DRAWN AEROBIC ONLY Blood Culture adequate volume   Culture   Final    NO GROWTH 5 DAYS Performed at [facility], [place] [address] [place], [place] [address]    Report Status 10/16/2020 FINAL  Final  Culture, Urine     Status: None   Collection Time: 10/11/20 11:04 AM   Specimen: Urine, Random  Result Value Ref Range Status   Specimen Description URINE, RANDOM  Final   Special Requests NONE  Final   Culture   Final    NO GROWTH Performed at [facility], [place] [address] [place], [place] [address]    Report Status 10/12/2020 FINAL  Final  MRSA PCR Screening     Status: None   Collection Time: 10/11/20 12:46 PM   Specimen: Nasopharyngeal  Result Value Ref Range Status   MRSA by PCR  NEGATIVE NEGATIVE Final    Comment:        The GeneXpert MRSA Assay (FDA approved for NASAL specimens only), is one component of a comprehensive MRSA colonization surveillance program. It is not intended to diagnose MRSA infection nor to guide or monitor treatment for MRSA infections. Performed at [facility], [place] [address], [place], [place] [address]   Culture, Respiratory w Gram Stain     Status: None   Collection Time: 10/11/20  1:02 PM   Specimen: Bronchoalveolar Lavage; Respiratory  Result Value  Ref Range Status   Specimen Description BRONCHIAL ALVEOLAR LAVAGE  Final   Special Requests NONE  Final   Gram Stain   Final    ABUNDANT WBC PRESENT, PREDOMINANTLY PMN NO ORGANISMS SEEN    Culture   Final    FEW Normal respiratory flora-no Staph aureus or Pseudomonas seen Performed at [facility], [address] [place], [place] [address]    Report Status 10/13/2020 FINAL  Final  Culture, blood (Routine X 2) w Reflex to ID Panel     Status: None   Collection Time: 10/11/20  7:54 PM   Specimen: BLOOD RIGHT HAND  Result Value Ref Range Status   Specimen Description BLOOD RIGHT HAND  Final   Special Requests   Final    BOTTLES DRAWN AEROBIC AND ANAEROBIC Blood Culture adequate volume   Culture   Final    NO GROWTH 5 DAYS Performed at [facility], [place] [address] [place], [place] [address]    Report Status 10/16/2020 FINAL  Final  Culture, blood (Routine X 2) w Reflex to ID Panel     Status: None   Collection Time: 10/11/20  8:07 PM   Specimen: BLOOD LEFT HAND  Result Value Ref Range Status   Specimen Description BLOOD LEFT HAND  Final   Special Requests   Final    BOTTLES DRAWN AEROBIC AND ANAEROBIC Blood Culture adequate volume   Culture   Final    NO GROWTH 5 DAYS Performed at [facility], [place] [address], [place], [place] [address]    Report Status 10/16/2020 FINAL  Final  Culture, Respiratory w Gram Stain     Status: None   Collection Time: 10/15/20  4:26 AM   Specimen: Tracheal Aspirate; Respiratory  Result Value Ref Range Status   Specimen Description TRACHEAL ASPIRATE  Final   Special Requests NONE  Final   Gram Stain   Final    NO WBC SEEN RARE BUDDING YEAST SEEN Performed at [facility], [address], [place], [place] [address]    Culture FEW CANDIDA KRUSEI  Final   Report Status 10/17/2020 FINAL  Final  MRSA PCR Screening     Status: None   Collection Time: 10/15/20  9:45 AM   Specimen: Nasopharyngeal  Result Value Ref Range  Status   MRSA by PCR NEGATIVE NEGATIVE Final    Comment:        The GeneXpert MRSA Assay (FDA approved for NASAL specimens only), is one component of a comprehensive MRSA colonization surveillance program. It is not intended to diagnose MRSA infection nor to guide or monitor treatment for MRSA infections. Performed at [facility], [place] [address], [place], [place] [address]          Radiology Studies: No results found.      Scheduled Meds: . arformoterol  15 mcg Nebulization BID  . aspirin  81 mg Per  Tube Daily  . chlorhexidine gluconate (MEDLINE KIT)  15 mL Mouth Rinse BID  . Chlorhexidine Gluconate Cloth  6 each Topical Daily  . docusate  100 mg Per Tube BID  . doxazosin  2 mg Per Tube Daily  . famotidine  20 mg Per Tube BID  . feeding supplement (PROSource TF)  45 mL Per Tube BID  . gabapentin  100 mg Per Tube Q8H  . heparin injection (subcutaneous)  5,000 Units Subcutaneous Q8H  . hydrALAZINE  10 mg Intravenous Once  . insulin aspart  0-9 Units Subcutaneous Q4H  . mouth rinse  15 mL Mouth Rinse q12n4p  . mirtazapine  30 mg Per Tube QHS  . nicotine  14 mg Transdermal Daily  . polyethylene glycol  17 g Per Tube Daily  . predniSONE  40 mg Per Tube Q breakfast  . revefenacin  175 mcg Nebulization Daily  . sodium chloride flush  10-40 mL Intracatheter Q12H   Continuous Infusions: . sodium chloride 10 mL/hr at 10/19/20 0700  . feeding supplement (OSMOLITE 1.5 CAL) 1,000 mL (10/18/20 1731)     LOS: 9 days    Time spent: 35 minutes.     Elmarie Shiley, MD [facility]   If 7PM-7AM, please contact night-coverage [email or web address]  10/19/2020, 7:36 AM

## 2020-10-20 DIAGNOSIS — W19XXXA Unspecified fall, initial encounter: Secondary | ICD-10-CM | POA: Diagnosis present

## 2020-10-20 DIAGNOSIS — R531 Weakness: Secondary | ICD-10-CM

## 2020-10-20 DIAGNOSIS — S72002A Fracture of unspecified part of neck of left femur, initial encounter for closed fracture: Secondary | ICD-10-CM | POA: Diagnosis not present

## 2020-10-20 DIAGNOSIS — M6282 Rhabdomyolysis: Secondary | ICD-10-CM | POA: Diagnosis present

## 2020-10-20 DIAGNOSIS — Z96642 Presence of left artificial hip joint: Secondary | ICD-10-CM

## 2020-10-20 DIAGNOSIS — E43 Unspecified severe protein-calorie malnutrition: Secondary | ICD-10-CM

## 2020-10-20 DIAGNOSIS — J9601 Acute respiratory failure with hypoxia: Secondary | ICD-10-CM | POA: Diagnosis present

## 2020-10-20 LAB — BASIC METABOLIC PANEL
Anion gap: 7 (ref 5–15)
BUN: 20 mg/dL (ref 8–23)
CO2: 25 mmol/L (ref 22–32)
Calcium: 8.6 mg/dL — ABNORMAL LOW (ref 8.9–10.3)
Chloride: 102 mmol/L (ref 98–111)
Creatinine, Ser: 0.66 mg/dL (ref 0.61–1.24)
GFR, Estimated: >60 mL/min (ref >60)
Glucose, Bld: 120 mg/dL — ABNORMAL HIGH (ref 70–99)
Potassium: 3.7 mmol/L (ref 3.5–5.1)
Sodium: 134 mmol/L — ABNORMAL LOW (ref 135–145)

## 2020-10-20 LAB — CBC
HCT: 28.1 % — ABNORMAL LOW (ref 39.0–52.0)
Hemoglobin: 9.5 g/dL — ABNORMAL LOW (ref 13.0–17.0)
MCH: 30.7 pg (ref 26.0–34.0)
MCHC: 33.8 g/dL (ref 30.0–36.0)
MCV: 90.9 fL (ref 80.0–100.0)
Platelets: 155 10*3/uL (ref 150–400)
RBC: 3.09 MIL/uL — ABNORMAL LOW (ref 4.22–5.81)
RDW: 18.2 % — ABNORMAL HIGH (ref 11.5–15.5)
WBC: 6.8 10*3/uL (ref 4.0–10.5)
nRBC: 1 % — ABNORMAL HIGH (ref 0.0–0.2)

## 2020-10-20 LAB — GLUCOSE, CAPILLARY
Glucose-Capillary: 108 mg/dL — ABNORMAL HIGH (ref 70–99)
Glucose-Capillary: 110 mg/dL — ABNORMAL HIGH (ref 70–99)
Glucose-Capillary: 123 mg/dL — ABNORMAL HIGH (ref 70–99)
Glucose-Capillary: 125 mg/dL — ABNORMAL HIGH (ref 70–99)
Glucose-Capillary: 135 mg/dL — ABNORMAL HIGH (ref 70–99)
Glucose-Capillary: 150 mg/dL — ABNORMAL HIGH (ref 70–99)

## 2020-10-20 MED ORDER — METOPROLOL TARTRATE 5 MG/5ML IV SOLN
5.0000 mg | Freq: Once | INTRAVENOUS | Status: AC
  Administered 2020-10-20: 5 mg via INTRAVENOUS
  Filled 2020-10-20: qty 5

## 2020-10-20 NOTE — Progress Notes (Addendum)
Notified Dr on call that b/p is outside of parameters @ 171/56. Unable to obtain Hydralazine from [facility] saying it was a one time  dose not due until 10/21/20. Asking for updated order.  Dr Tonie Griffith returned call with new order for Metoprolol 5mg  IVP-same to be given-see MAR.

## 2020-10-20 NOTE — Progress Notes (Signed)
V/O given by Lorella Nimrod, MD to D/C teley and to reorder speech eval.

## 2020-10-20 NOTE — Plan of Care (Signed)
  Problem: Education: Goal: Verbalization of understanding the information provided (i.e., activity precautions, restrictions, etc) will improve Outcome: Progressing   

## 2020-10-20 NOTE — Progress Notes (Signed)
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  Patient was found by brother on 3/30 after he had been unable to reach patient for unknown period of time (1 to 3 days suspected).  Patient fracture left frontal sinus, L3 transverse process, displaced angulated mildly comminuted left femoral neck fracture.  AKI with rhabdomyolysis.  On 3/31 patient develop worsening hypoxia and respiratory distress, intubated.  Subsequently extubated 4/2 and transferred out of the ICU on 4/3.  The evening of 4/3--4/4 he developed increased work of breathing and increased oxygen needs requiring intubation.  This was thought to be related to an aspiration event.  Significant events: -3/30 admitted to [facility] for encephalopathy. Facial fx, L3 TP fx, Hip fx.  -3/31 non op from [place] standpoint. Ortho planned to OR 3/31 however acute hypoxic respiratory failure, transferred to ICU and emergently intubated.  -3/31 ETT >>4/1 -3/31 bronchoscopy with BAL left lower lobe -4/1 LT  Hip Hemiarthroplasty (Anterior Approach) -4/4 ETT>>4/6  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 [facility])   Pressure injury of skin   Closed fracture of left hip (HCC)   Protein-calorie malnutrition, severe   1-Acute Hypoxic Respiratory Failure, Aspiration Pneumonia, COPD Emphysema Mucus Plug 4/05. History of a small cell lung cancer status post chemotherapy with lurbinectedin last cycle 09/12/20. -Extubated 4\6 -Completed 7 days of cefepime 4/05. -Brovana/Yupelri nebulizer treatments and PRN albuterol.  -received Solumedrol on 4/05 during mucus plug event, prednisone for COPD.  -Patient  To be discharge on Incruse and  Dulera. On nebulizer while inpatient.  -Stable on Room Air.   2-Nausea vomiting: In setting of constipation, KUB nonobstructive bowel pattern  Nutrition;  On tube feeding.  Through core track. Swallow evaluation was attempted today but patient became nauseated and started vomiting so it was discontinued.  Current recommendations are to maintain n.p.o. and continue feeding through tube.  If patient needs long-term tube feeding-we will order PEG tube with IR.  GI, Diarrhea; resolved after stopping laxative and change in his tube feed formula.  Acute metabolic encephalopathy: Delirium.  Appears to be at baseline now. Likely in the setting of acute respiratory failure, due to aspiration pneumonia and ICU delirium. Continue Gabapentin,dose was reduced to 100 mg 3 times daily from 300 mg  Metastatic small cell carcinoma -Lung, liver -Follows with Dr. Delton Coombes.  -CT head; No acute intracranial pathology   Left femoral Neck Fx S/P Hemiarthroplasty:  Ortho following.  Patient remove wound vac last night.  Nurse will page ortho and inform them. /  PT following.   Left Facial Fx, L 3 TP Fx:  Neurosurgery  evaluated , no surgical intervention needed.  Neurosurgery recommend ENT consult. I will consult ENT>  I spoke with ENT on called for [place] recommend Conservative management, Fracture is non displaced, does not need anything acute.   Rhabdomyolysis.  Resolved with IV fluids.   Thrombocytopenia, anemia; Platelet improving.  Hb stable and seems improving slowly.  S/p PRBC on 10/17/2020  Tabbacco abuse. Needs counseling.   Sepsis was ruled out.   Pressure Injury 10/11/20 Sacrum Medial Deep Tissue Pressure Injury - Purple or maroon localized area of discolored intact skin or blood-filled blister due to damage of  underlying soft tissue from pressure and/or shear. (Active)  10/11/20 0220  Location: Sacrum  Location Orientation: Medial  Staging: Deep Tissue Pressure Injury - Purple or  maroon localized area of discolored intact skin or blood-filled blister due to damage of underlying soft tissue from pressure and/or shear.  Wound Description (Comments):   Present on Admission: Yes     Pressure Injury 10/15/20 Heel Right Deep Tissue Pressure Injury - Purple or maroon localized area of discolored intact skin or blood-filled blister due to damage of underlying soft tissue from pressure and/or shear. (Active)  10/15/20 0323  Location: Heel  Location Orientation: Right  Staging: Deep Tissue Pressure Injury - Purple or maroon localized area of discolored intact skin or blood-filled blister due to damage of underlying soft tissue from pressure and/or shear.  Wound Description (Comments):   Present on Admission: No    Nutrition Problem: Severe Malnutrition Etiology: chronic illness (small cell carcinoma of the lung)  Signs/Symptoms: severe muscle depletion,severe fat depletion  Interventions: Tube feeding,Prostat  Estimated body mass index is 21.42 kg/m as calculated from the following:   Height as of this encounter: _0  (1.753 m).   Weight as of this encounter: 65.8 kg.   DVT prophylaxis: Heparin  Code Status: Full Code Family Communication: Talked with brother on phone. Disposition Plan:  Status is: Inpatient  Remains inpatient appropriate because:IV treatments appropriate due to intensity of illness or inability to take PO   Dispo:  Patient From: Home  Planned Disposition: [place]  Medically stable for discharge: No   Patient is very sick with multiple commodities and advice cancer, full code at this time.  Very high risk for deterioration and death-I will involve palliative care.  Consultants:   Neurosurgery  Ortho  CCM    Procedures:     Antimicrobials:   Subjective: Patient was seen and examined today.  No new complaint.  We discussed about getting a PEG tube if unable to swallow.  He seems agreeable but I am not sure how  much he understands.  Objective: Vitals:   10/19/20 2351 10/20/20 0346 10/20/20 0600 10/20/20 0713  BP: (!) 178/56 (!) 185/51 (!) 156/48   Pulse: 71 81  71  Resp: 19 18    Temp: 97.9 F (36.6 C) 98.2 F (36.8 C)    TempSrc: Oral Oral    SpO2: 98% 98%    Weight:  65.8 kg    Height:        Intake/Output Summary (Last 24 hours) at 10/20/2020 1408 Last data filed at 10/20/2020 0354 Gross per 24 hour  Intake 29.99 ml  Output 1700 ml  Net -1670.01 ml   Filed Weights   10/17/20 0154 10/18/20 0426 10/20/20 0346  Weight: 67 kg 67.1 kg 65.8 kg    Examination:  General. Frail, emaciated gentleman, In no acute distress. Pulmonary.  Lungs clear bilaterally, normal respiratory effort. CV.  Regular rate and rhythm, no JVD, rub or murmur. Abdomen.  Soft, nontender, nondistended, BS positive. CNS.  Alert and oriented x3.  No focal neurologic deficit. Extremities.  No edema, no cyanosis, pulses intact and symmetrical. Psychiatry.  Judgment and insight appears normal.   Data Reviewed: I have personally reviewed following labs and imaging studies  CBC: Recent Labs  Lab 10/13/20 1605 10/14/20 0339 10/16/20 0434 10/17/20 0857 10/18/20 0447 10/19/20 1145 10/20/20 0206  WBC 11.1*   < > 7.0 7.0 6.6 6.2 6.8  NEUTROABS 8.9*  --   --   --   --   --   --  HGB 9.3*   < > 8.8* 6.5* 7.4* 9.1* 9.5*  HCT 27.9*   < > 26.7* 19.2* 21.8* 27.6* 28.1*  MCV 95.5   < > 96.4 93.2 93.2 93.6 90.9  PLT 79*   < > 94* 84* 104* 146* 155   < > = values in this interval not displayed.   Basic Metabolic Panel: Recent Labs  Lab 10/15/20 1654 10/16/20 0044 10/16/20 0150 10/16/20 0434 10/16/20 1704 10/17/20 0430 10/17/20 0855 10/18/20 0447 10/18/20 0733 10/19/20 0544 10/20/20 0206  NA  --  139   < > 140  --   --  138 139  --  137 134*  K  --  3.7   < > 3.8  --   --  3.9 3.3*  --  3.7 3.7  CL  --  110  --  109  --   --  106 107  --  104 102  CO2  --  25  --  24  --   --  25 28  --  28 25  GLUCOSE   --  121*  --  123*  --   --  138* 114*  --  124* 120*  BUN  --  20  --  21  --   --  25* 30*  --  22 20  CREATININE  --  0.86  --  0.84  --   --  0.71 0.75  --  0.68 0.66  CALCIUM  --  8.0*  --  8.0*  --   --  8.1* 8.3*  --  8.3* 8.6*  MG 1.8 2.0  --  1.8 2.3 2.3  --  2.3  --   --   --   PHOS 1.7*  --   --  2.5 4.0 2.5  --   --  2.3*  --   --    < > = values in this interval not displayed.   GFR: Estimated Creatinine Clearance: 81.1 mL/min (by C-G formula based on SCr of 0.66 mg/dL). Liver Function Tests: Recent Labs  Lab 10/14/20 0339 10/15/20 0449 10/16/20 0434  AST 44* 42* 40  ALT 52* 47* 42  ALKPHOS 76 80 69  BILITOT 1.2 0.8 0.6  PROT 5.2* 5.2* 5.1*  ALBUMIN 2.3* 2.1* 2.1*   No results for input(s): LIPASE, AMYLASE in the last 168 hours. No results for input(s): AMMONIA in the last 168 hours. Coagulation Profile: No results for input(s): INR, PROTIME in the last 168 hours. Cardiac Enzymes: No results for input(s): CKTOTAL, CKMB, CKMBINDEX, TROPONINI in the last 168 hours. BNP (last 3 results) No results for input(s): PROBNP in the last 8760 hours. HbA1C: Recent Labs    10/18/20 1640  HGBA1C 5.8*   CBG: Recent Labs  Lab 10/19/20 2011 10/20/20 0009 10/20/20 0343 10/20/20 0712 10/20/20 1159  GLUCAP 113* 123* 110* 125* 108*   Lipid Profile: No results for input(s): CHOL, HDL, LDLCALC, TRIG, CHOLHDL, LDLDIRECT in the last 72 hours. Thyroid Function Tests: No results for input(s): TSH, T4TOTAL, FREET4, T3FREE, THYROIDAB in the last 72 hours. Anemia Panel: No results for input(s): VITAMINB12, FOLATE, FERRITIN, TIBC, IRON, RETICCTPCT in the last 72 hours. Sepsis Labs: No results for input(s): PROCALCITON, LATICACIDVEN in the last 168 hours.  Recent Results (from the past 240 hour(s))  Resp Panel by RT-PCR (Flu A&B, Covid) Nasopharyngeal Swab     Status: None   Collection Time: 10/10/20  6:25 PM   Specimen: Nasopharyngeal Swab; Nasopharyngeal(NP) swabs  in vial  transport medium  Result Value Ref Range Status   SARS Coronavirus 2 by RT PCR NEGATIVE NEGATIVE Final    Comment: (NOTE) SARS-CoV-2 target nucleic acids are NOT DETECTED.  The SARS-CoV-2 RNA is generally detectable in upper respiratory specimens during the acute phase of infection. The lowest concentration of SARS-CoV-2 viral copies this assay can detect is 138 copies/mL. A negative result does not preclude SARS-Cov-2 infection and should not be used as the sole basis for treatment or other patient management decisions. A negative result may occur with  improper specimen collection/handling, submission of specimen other than nasopharyngeal swab, presence of viral mutation(s) within the areas targeted by this assay, and inadequate number of viral copies(<138 copies/mL). A negative result must be combined with clinical observations, patient history, and epidemiological information. The expected result is Negative.  Fact Sheet for Patients:  EntrepreneurPulse.com.au  Fact Sheet for Healthcare Providers:  IncredibleEmployment.be  This test is no t yet approved or cleared by the [place] FDA and  has been authorized for detection and/or diagnosis of SARS-CoV-2 by FDA under an Emergency Use Authorization (EUA). This EUA will remain  in effect (meaning this test can be used) for the duration of the COVID-19 declaration under Section 564(b)(1) of the Act, 21 U.S.C.section 360bbb-3(b)(1), unless the authorization is terminated  or revoked sooner.       Influenza A by PCR NEGATIVE NEGATIVE Final   Influenza B by PCR NEGATIVE NEGATIVE Final    Comment: (NOTE) The Xpert Xpress SARS-CoV-2/FLU/RSV plus assay is intended as an aid in the diagnosis of influenza from Nasopharyngeal swab specimens and should not be used as a sole basis for treatment. Nasal washings and aspirates are unacceptable for Xpert Xpress SARS-CoV-2/FLU/RSV testing.  Fact  Sheet for Patients: EntrepreneurPulse.com.au  Fact Sheet for Healthcare Providers: IncredibleEmployment.be  This test is not yet approved or cleared by the [place] FDA and has been authorized for detection and/or diagnosis of SARS-CoV-2 by FDA under an Emergency Use Authorization (EUA). This EUA will remain in effect (meaning this test can be used) for the duration of the COVID-19 declaration under Section 564(b)(1) of the Act, 21 U.S.C. section 360bbb-3(b)(1), unless the authorization is terminated or revoked.  Performed at [facility], [place] [address] [place], [place] [address]   Culture, blood (routine x 2)     Status: None   Collection Time: 10/11/20 10:53 AM   Specimen: BLOOD  Result Value Ref Range Status   Specimen Description BLOOD RIGHT ANTECUBITAL  Final   Special Requests   Final    BOTTLES DRAWN AEROBIC AND ANAEROBIC Blood Culture adequate volume   Culture   Final    NO GROWTH 5 DAYS Performed at [facility], [place] [address], [place], [place] [address]    Report Status 10/16/2020 FINAL  Final  Culture, blood (routine x 2)     Status: None   Collection Time: 10/11/20 11:00 AM   Specimen: BLOOD RIGHT HAND  Result Value Ref Range Status   Specimen Description BLOOD RIGHT HAND  Final   Special Requests   Final    BOTTLES DRAWN AEROBIC ONLY Blood Culture adequate volume   Culture   Final    NO GROWTH 5 DAYS Performed at [facility], [place] [address] [place], [place] [address]    Report Status 10/16/2020 FINAL  Final  Culture, Urine     Status: None   Collection Time: 10/11/20 11:04 AM   Specimen:  Urine, Random  Result Value Ref Range Status   Specimen Description URINE, RANDOM  Final   Special Requests NONE  Final   Culture   Final    NO GROWTH Performed at [facility], [address], [place], [place] [address]    Report Status 10/12/2020 FINAL  Final  MRSA PCR Screening     Status: None    Collection Time: 10/11/20 12:46 PM   Specimen: Nasopharyngeal  Result Value Ref Range Status   MRSA by PCR NEGATIVE NEGATIVE Final    Comment:        The GeneXpert MRSA Assay (FDA approved for NASAL specimens only), is one component of a comprehensive MRSA colonization surveillance program. It is not intended to diagnose MRSA infection nor to guide or monitor treatment for MRSA infections. Performed at [facility], [place] [address], [place], [place] [address]   Culture, Respiratory w Gram Stain     Status: None   Collection Time: 10/11/20  1:02 PM   Specimen: Bronchoalveolar Lavage; Respiratory  Result Value Ref Range Status   Specimen Description BRONCHIAL ALVEOLAR LAVAGE  Final   Special Requests NONE  Final   Gram Stain   Final    ABUNDANT WBC PRESENT, PREDOMINANTLY PMN NO ORGANISMS SEEN    Culture   Final    FEW Normal respiratory flora-no Staph aureus or Pseudomonas seen Performed at [facility], [address], [place], [place] [address]    Report Status 10/13/2020 FINAL  Final  Culture, blood (Routine X 2) w Reflex to ID Panel     Status: None   Collection Time: 10/11/20  7:54 PM   Specimen: BLOOD RIGHT HAND  Result Value Ref Range Status   Specimen Description BLOOD RIGHT HAND  Final   Special Requests   Final    BOTTLES DRAWN AEROBIC AND ANAEROBIC Blood Culture adequate volume   Culture   Final    NO GROWTH 5 DAYS Performed at [facility], [place] [address], [place], [place] [address]    Report Status 10/16/2020 FINAL  Final  Culture, blood (Routine X 2) w Reflex to ID Panel     Status: None   Collection Time: 10/11/20  8:07 PM   Specimen: BLOOD LEFT HAND  Result Value Ref Range Status   Specimen Description BLOOD LEFT HAND  Final   Special Requests   Final    BOTTLES DRAWN AEROBIC AND ANAEROBIC Blood Culture adequate volume   Culture   Final    NO GROWTH 5 DAYS Performed at [facility], [place] [address], [place], [place] [address]     Report Status 10/16/2020 FINAL  Final  Culture, Respiratory w Gram Stain     Status: None   Collection Time: 10/15/20  4:26 AM   Specimen: Tracheal Aspirate; Respiratory  Result Value Ref Range Status   Specimen Description TRACHEAL ASPIRATE  Final   Special Requests NONE  Final   Gram Stain   Final    NO WBC SEEN RARE BUDDING YEAST SEEN Performed at [facility], [address] [place], [place] [address]    Culture FEW CANDIDA KRUSEI  Final   Report Status 10/17/2020 FINAL  Final  MRSA PCR Screening     Status: None   Collection Time: 10/15/20  9:45 AM   Specimen: Nasopharyngeal  Result Value Ref Range Status   MRSA by PCR NEGATIVE NEGATIVE Final    Comment:  The GeneXpert MRSA Assay (FDA approved for NASAL specimens only), is one component of a comprehensive MRSA colonization surveillance program. It is not intended to diagnose MRSA infection nor to guide or monitor treatment for MRSA infections. Performed at [facility], [place] [address], [place], [place] [address]      Radiology Studies: No results found.  Scheduled Meds: . arformoterol  15 mcg Nebulization BID  . aspirin  81 mg Per Tube Daily  . chlorhexidine gluconate (MEDLINE KIT)  15 mL Mouth Rinse BID  . Chlorhexidine Gluconate Cloth  6 each Topical Daily  . doxazosin  2 mg Per Tube Daily  . famotidine  20 mg Per Tube BID  . feeding supplement (PROSource TF)  45 mL Per Tube BID  . free water  150 mL Per Tube Q4H  . gabapentin  100 mg Per Tube Q8H  . heparin injection (subcutaneous)  5,000 Units Subcutaneous Q8H  . hydrALAZINE  10 mg Intravenous Once  . insulin aspart  0-9 Units Subcutaneous Q4H  . mouth rinse  15 mL Mouth Rinse q12n4p  . mirtazapine  30 mg Per Tube QHS  . nicotine  14 mg Transdermal Daily  . revefenacin  175 mcg Nebulization Daily  . sodium chloride flush  10-40 mL Intracatheter Q12H   Continuous Infusions: . sodium chloride 10 mL/hr at 10/19/20 1500  . feeding  supplement (JEVITY 1.5 CAL/FIBER) 1,000 mL (10/20/20 1155)     LOS: 10 days    Time spent: 35 minutes.    Lorella Nimrod, MD [facility]   If 7PM-7AM, please contact night-coverage [email or web address]  10/20/2020, 2:08 PM

## 2020-10-20 NOTE — Evaluation (Signed)
Clinical/Bedside Swallow Evaluation Patient Details  Name: [name] MRN: [id number] Date of Birth: May 10, 1951  Today's Date: 10/20/2020 Time: SLP Start Time (ACUTE ONLY): 0901 SLP Stop Time (ACUTE ONLY): 0923 SLP Time Calculation (min) (ACUTE ONLY): 22 min  Past Medical History:  Past Medical History:  Diagnosis Date  . Anxiety   . CAD (coronary artery disease)   . Cancer ([place])    stage 4 small cell lung cancer   . COPD (chronic obstructive pulmonary disease) ([place])   . Depression   . Dyspnea    increased exertion  . Feeling of chest tightness   . Heart palpitations   . History of chemotherapy   . Myocardial infarction ([place])   . Osteopenia   . Panic attacks   . Smoker    Past Surgical History:  Past Surgical History:  Procedure Laterality Date  . BACK SURGERY  12/24/2000   L5,S1  . CORONARY STENT PLACEMENT  2005   RCA & CX  . HERNIA REPAIR Right 1980's  . INGUINAL HERNIA REPAIR  12/1978   right side  . IR FLUORO GUIDE PORT INSERTION RIGHT  04/02/2017  . IR US GUIDE BX ASP/DRAIN  02/03/2017  . IR US GUIDE VASC ACCESS RIGHT  04/02/2017  . NM MYOCAR PERF WALL MOTION  09/07/2009   No ischemia; EF 51%  . SHOULDER SURGERY Left 08/2010  . SPINE SURGERY  2002   L5-S1  . TOTAL HIP ARTHROPLASTY Left 10/12/2020   Procedure: TOTALhemi  HIP ARTHROPLASTY ANTERIOR APPROACH;  Surgeon: [name], MD;  Location: [facility];  Service: Orthopedics;  Laterality: Left;   HPI:  Pt is a 70 yo male admitted 3/30 with AMS after fall with unknown downtime (1-3 days suspected). Pt was found to have L hip fx, L L3 transverse process fx, L frontal sinus fx, and AKI with rhabdomyolysis. On 3/31 pt had worsening hypoxia thought to be related to an aspiration event, requiring ETT 3/31-4/2; 4/4-4/6. 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 Pt was reporting intermittent difficulty swallowing solids  in 2019 per cancer center notes.   Assessment / Plan / Recommendation Clinical Impression  Pt was alert and confused but following most commands. He consumed a single ice chip without overt difficulty initially, but after a brief delay began coughing and vomiting what appeared to be a combination of frothy secretions mixed with yellow stomach content. He could cough and expectorate without needing oral suction. Even once this had subsided, pt reported feeling nauseated, so additional PO trials were deferred (RN made aware). Would maintain NPO status (pt does have cortrak) with additional SLP f/u to facilitate return to PO diet.  SLP Visit Diagnosis: Dysphagia, unspecified (R13.10)    Aspiration Risk  Mild aspiration risk;Moderate aspiration risk    Diet Recommendation NPO   Medication Administration: Via alternative means    Other  Recommendations Oral Care Recommendations: Oral care QID   Follow up Recommendations  (tba)      Frequency and Duration min 2x/week  2 weeks       Prognosis Prognosis for Safe Diet Advancement: Good Barriers to Reach Goals: Cognitive deficits      Swallow Study   General HPI: Pt is a 70 yo male admitted 3/30 with AMS after fall with unknown downtime (1-3 days suspected). Pt was found to have L hip fx, L L3 transverse process fx, L frontal sinus fx, and AKI  with rhabdomyolysis. On 3/31 pt had worsening hypoxia thought to be related to an aspiration event, requiring ETT 3/31-4/2; 4/4-4/6. 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 Pt was reporting intermittent difficulty swallowing solids in 2019 per cancer center notes. Type of Study: Bedside Swallow Evaluation Previous Swallow Assessment: none in chart Diet Prior to this Study: NPO;NG Tube Temperature Spikes Noted: No Respiratory Status: Room air History of Recent Intubation: Yes Length of  Intubations (days): 4 days (across two intubations) Date extubated: 10/17/20 Behavior/Cognition: Alert;Cooperative;Confused Oral Cavity Assessment: Within Functional Limits Oral Care Completed by SLP: No Oral Cavity - Dentition: Edentulous;Dentures, not available Vision: Functional for self-feeding Self-Feeding Abilities: Needs assist Patient Positioning: Upright in bed Baseline Vocal Quality: Normal Volitional Cough: Strong;Congested Volitional Swallow: Able to elicit    Oral/Motor/Sensory Function Overall Oral Motor/Sensory Function: Within functional limits   Ice Chips Ice chips: Impaired Presentation: Spoon Pharyngeal Phase Impairments: Cough - Delayed   Thin Liquid Thin Liquid: Not tested    Nectar Thick Nectar Thick Liquid: Not tested   Honey Thick Honey Thick Liquid: Not tested   Puree Puree: Not tested   Solid     Solid: Not tested      Osie Bond., M.A. [place] Pager [phone] Office [phone]  10/20/2020,9:23 AM

## 2020-10-21 DIAGNOSIS — M6282 Rhabdomyolysis: Secondary | ICD-10-CM | POA: Diagnosis not present

## 2020-10-21 DIAGNOSIS — S72002A Fracture of unspecified part of neck of left femur, initial encounter for closed fracture: Secondary | ICD-10-CM | POA: Diagnosis not present

## 2020-10-21 DIAGNOSIS — R1310 Dysphagia, unspecified: Secondary | ICD-10-CM

## 2020-10-21 DIAGNOSIS — Z96642 Presence of left artificial hip joint: Secondary | ICD-10-CM | POA: Diagnosis not present

## 2020-10-21 DIAGNOSIS — W19XXXA Unspecified fall, initial encounter: Secondary | ICD-10-CM | POA: Diagnosis not present

## 2020-10-21 DIAGNOSIS — E43 Unspecified severe protein-calorie malnutrition: Secondary | ICD-10-CM | POA: Diagnosis not present

## 2020-10-21 DIAGNOSIS — C3490 Malignant neoplasm of unspecified part of unspecified bronchus or lung: Secondary | ICD-10-CM | POA: Diagnosis not present

## 2020-10-21 DIAGNOSIS — Z515 Encounter for palliative care: Secondary | ICD-10-CM | POA: Diagnosis not present

## 2020-10-21 DIAGNOSIS — Z7189 Other specified counseling: Secondary | ICD-10-CM

## 2020-10-21 LAB — GLUCOSE, CAPILLARY
Glucose-Capillary: 104 mg/dL — ABNORMAL HIGH (ref 70–99)
Glucose-Capillary: 115 mg/dL — ABNORMAL HIGH (ref 70–99)
Glucose-Capillary: 120 mg/dL — ABNORMAL HIGH (ref 70–99)
Glucose-Capillary: 121 mg/dL — ABNORMAL HIGH (ref 70–99)
Glucose-Capillary: 123 mg/dL — ABNORMAL HIGH (ref 70–99)
Glucose-Capillary: 130 mg/dL — ABNORMAL HIGH (ref 70–99)
Glucose-Capillary: 88 mg/dL (ref 70–99)

## 2020-10-21 NOTE — Evaluation (Signed)
Clinical/Bedside Swallow Re-Evaluation Patient Details  Name: [name] MRN: [id number] Date of Birth: 1950-12-22  Today's Date: 10/21/2020 Time: SLP Start Time (ACUTE ONLY): 0831 SLP Stop Time (ACUTE ONLY): 0845 SLP Time Calculation (min) (ACUTE ONLY): 14 min  Past Medical History:  Past Medical History:  Diagnosis Date  . Anxiety   . CAD (coronary artery disease)   . Cancer ([place])    stage 4 small cell lung cancer   . COPD (chronic obstructive pulmonary disease) ([place])   . Depression   . Dyspnea    increased exertion  . Feeling of chest tightness   . Heart palpitations   . History of chemotherapy   . Myocardial infarction ([place])   . Osteopenia   . Panic attacks   . Smoker    Past Surgical History:  Past Surgical History:  Procedure Laterality Date  . BACK SURGERY  12/24/2000   L5,S1  . CORONARY STENT PLACEMENT  2005   RCA & CX  . HERNIA REPAIR Right 1980's  . INGUINAL HERNIA REPAIR  12/1978   right side  . IR FLUORO GUIDE PORT INSERTION RIGHT  04/02/2017  . IR US GUIDE BX ASP/DRAIN  02/03/2017  . IR US GUIDE VASC ACCESS RIGHT  04/02/2017  . NM MYOCAR PERF WALL MOTION  09/07/2009   No ischemia; EF 51%  . SHOULDER SURGERY Left 08/2010  . SPINE SURGERY  2002   L5-S1  . TOTAL HIP ARTHROPLASTY Left 10/12/2020   Procedure: TOTALhemi  HIP ARTHROPLASTY ANTERIOR APPROACH;  Surgeon: [name], MD;  Location: [facility];  Service: Orthopedics;  Laterality: Left;   HPI:  Pt is a 70 yo male admitted 3/30 with AMS after fall with unknown downtime (1-3 days suspected). Pt was found to have L hip fx, L L3 transverse process fx, L frontal sinus fx, and AKI with rhabdomyolysis. On 3/31 pt had worsening hypoxia thought to be related to an aspiration event, requiring ETT 3/31-4/2; another suspected aspiration event on requiring re-intubation 4/4-4/6. 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 Pt was reporting intermittent difficulty swallowing solids in 2019 per cancer center notes.   Assessment / Plan / Recommendation Clinical Impression  Mr. Heskett demonstrates ongoing s/s aspiration, including cough after all PO trials (immediate with thin liquids/ice and delayed after purees). He was also noted with mild oral hold with pureed trials, likely related to confusion/mental status. Pt is currently restrained due to pulling at NGT. Given ongoing s/s aspiration, ongoing PNA, as well as recently history of suspected aspiration events requiring intubation, plan to complete objective swallow study prior to initiating a diet. Pt and nurse educated on recommendation and demonstrate understanding and agreement. Pt may have single ice chips for comfort, fed by staff, after oral care is completed.   SLP Visit Diagnosis: Dysphagia, unspecified (R13.10)    Aspiration Risk  Mild aspiration risk;Moderate aspiration risk    Diet Recommendation NPO   Medication Administration: Via alternative means    Other  Recommendations Oral Care Recommendations: Oral care QID   Follow up Recommendations  (tba)      Frequency and Duration min 2x/week  2 weeks       Prognosis Prognosis for Safe Diet Advancement: Good Barriers to Reach Goals: Cognitive deficits      Swallow Study   General Date of Onset: 10/10/20 HPI: Pt is a 70 yo male admitted 3/30 with AMS after fall  with unknown downtime (1-3 days suspected). Pt was found to have L hip fx, L L3 transverse process fx, L frontal sinus fx, and AKI with rhabdomyolysis. On 3/31 pt had worsening hypoxia thought to be related to an aspiration event, requiring ETT 3/31-4/2; another suspected aspiration event on requiring re-intubation 4/4-4/6. 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 Pt was reporting intermittent difficulty swallowing  solids in 2019 per cancer center notes. Previous Swallow Assessment: BSE 10/20/20; limited by n/v Diet Prior to this Study: NPO Respiratory Status: Room air History of Recent Intubation: Yes Length of Intubations (days): 4 days (across two intubations) Date extubated: 10/17/20 Behavior/Cognition: Alert;Cooperative;Confused Oral Care Completed by SLP: Recent completion by staff Oral Cavity - Dentition: Edentulous;Dentures, not available Vision: Functional for self-feeding Self-Feeding Abilities: Other (Comment) (restrained; unknown) Patient Positioning: Upright in bed Baseline Vocal Quality: Wet;Other (comment) (congested) Volitional Cough: Strong;Congested;Wet Volitional Swallow: Able to elicit    Oral/Motor/Sensory Function Overall Oral Motor/Sensory Function: Within functional limits   Ice Chips Ice chips: Impaired Presentation: Spoon Pharyngeal Phase Impairments: Cough - Delayed;Cough - Immediate   Thin Liquid Thin Liquid: Impaired Presentation: Spoon;Cup Pharyngeal  Phase Impairments: Wet Vocal Quality;Cough - Delayed;Cough - Immediate    Nectar Thick   Not tested  Honey Thick   Not tested  Puree Puree: Impaired Presentation: Spoon Pharyngeal Phase Impairments: Cough - Delayed;Multiple swallows   Solid     Solid: Not tested     Christe Tellez P. Mantaj Chamberlin, M.S., CCC-SLP Speech-Language Pathologist Acute Rehabilitation Services Pager: [place] 10/21/2020,9:02 AM

## 2020-10-21 NOTE — Consult Note (Signed)
Consultation Note Date: 10/21/2020   Patient Name: [name]  DOB: 08-29-1950  MRN: [id number]  Age / Sex: 70 y.o., male  PCP: [name], MD Referring Physician: Lorella Nimrod, MD  Reason for Consultation: Establishing goals of care  HPI/Patient Profile: 70 y.o. 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 Had acute respiratory failure on 3/31 requiring intubation, extubated 4/2 with reintubation on 4/3 due to possible aspiration event, extubated 4/6. He remains confused. Has significant dysphagia and is currently NPO with coretrak in place. Palliative medicine consulted for goals of care for this very ill man with multiple comorbidities who is at high risk of decompensation and dying.    Clinical Assessment and Goals of Care: Chart reviewed and patient examined.  He is awake and alert- however he is confused. He tells me he wants to go outside to urinate, he is concerned about it "puddling up against the wall". Furthermore, he tells me has gone out two times today to smoke a cigarette and he is ready to go again.  He is aware that he is in the hospital because he was weak and fell. He is unaware that he has a feeding tube in his nose until I point it out.  Simple goals of care discussion was attempted- [name] tells me he doesn't feel as weak as he did yesterday. He says he wants to continue all interventions that will make him stronger- in his view this includes a permanent feeding tube if needed. He is not receptive when I attempt to discuss what appears to be some decline in his status.  He does want CPR- but would not want to kept alive artificially long term on life support.    Primary Decision  Maker NEXT OF KIN- brother [name]    SUMMARY OF RECOMMENDATIONS -Continue full scope/full code -I tried to call patient's brother at both listed numbers- unable to reach- left message requesting return call    Code Status/Advance Care Planning:  Full code  Prognosis:    Unable to determine  Discharge Planning: To Be Determined  Primary Diagnoses: Present on Admission: **None**   I have reviewed the medical record, interviewed the patient and family, and examined the patient. The following aspects are pertinent.  Past Medical History:  Diagnosis Date  . Anxiety   . CAD (coronary artery disease)   . Cancer ([place])    stage 4 small cell lung cancer   . COPD (chronic obstructive pulmonary disease) ([place])   . Depression   . Dyspnea    increased exertion  . Feeling of chest tightness   . Heart palpitations   . History of chemotherapy   . Myocardial infarction ([place])   . Osteopenia   . Panic attacks   . Smoker    Social History   Socioeconomic History  .  Marital status: Single    Spouse name: Not on file  . Number of children: Not on file  . Years of education: Not on file  . Highest education level: Not on file  Occupational History  . Not on file  Tobacco Use  . Smoking status: Current Every Day Smoker    Packs/day: 1.00    Types: Cigarettes  . Smokeless tobacco: Never Used  Vaping Use  . Vaping Use: Never used  Substance and Sexual Activity  . Alcohol use: Yes    Comment: occas  . Drug use: No  . Sexual activity: Not on file  Other Topics Concern  . Not on file  Social History Narrative  . Not on file   Social Determinants of Health   Financial Resource Strain: Low Risk   . Difficulty of Paying Living Expenses: Not hard at all  Food Insecurity: No Food Insecurity  . Worried About Charity fundraiser in the Last Year: Never true  . Ran Out of Food in the Last Year: Never true  Transportation Needs: No Transportation Needs  . Lack of  Transportation (Medical): No  . Lack of Transportation (Non-Medical): No  Physical Activity: Inactive  . Days of Exercise per Week: 0 days  . Minutes of Exercise per Session: 0 min  Stress: No Stress Concern Present  . Feeling of Stress : Not at all  Social Connections: Socially Isolated  . Frequency of Communication with Friends and Family: More than three times a week  . Frequency of Social Gatherings with Friends and Family: More than three times a week  . Attends Religious Services: Never  . Active Member of Clubs or Organizations: No  . Attends Archivist Meetings: Never  . Marital Status: Never married   Scheduled Meds: . arformoterol  15 mcg Nebulization BID  . aspirin  81 mg Per Tube Daily  . chlorhexidine gluconate (MEDLINE KIT)  15 mL Mouth Rinse BID  . Chlorhexidine Gluconate Cloth  6 each Topical Daily  . doxazosin  2 mg Per Tube Daily  . famotidine  20 mg Per Tube BID  . feeding supplement (PROSource TF)  45 mL Per Tube BID  . free water  150 mL Per Tube Q4H  . gabapentin  100 mg Per Tube Q8H  . heparin injection (subcutaneous)  5,000 Units Subcutaneous Q8H  . hydrALAZINE  10 mg Intravenous Once  . insulin aspart  0-9 Units Subcutaneous Q4H  . mouth rinse  15 mL Mouth Rinse q12n4p  . mirtazapine  30 mg Per Tube QHS  . nicotine  14 mg Transdermal Daily  . revefenacin  175 mcg Nebulization Daily  . sodium chloride flush  10-40 mL Intracatheter Q12H   Continuous Infusions: . sodium chloride 10 mL/hr at 10/19/20 1500  . feeding supplement (JEVITY 1.5 CAL/FIBER) 1,000 mL (10/21/20 1147)   PRN Meds:.sodium chloride, acetaminophen **OR** acetaminophen, albuterol, ondansetron (ZOFRAN) IV, sodium chloride flush Medications Prior to Admission:  Prior to Admission medications   Medication Sig Start Date End Date Taking? Authorizing Provider  enoxaparin (LOVENOX) 40 MG/0.4ML injection Inject 0.4 mLs (40 mg total) into the skin daily for 14 days. 10/12/20 10/26/20 Yes  [name], MD  oxyCODONE-acetaminophen (PERCOCET) 5-325 MG tablet Take 1-2 tablets by mouth every 8 (eight) hours as needed for severe pain. 10/12/20  Yes [name], MD  albuterol (VENTOLIN HFA) 108 (90 Base) MCG/ACT inhaler Inhale 2 puffs into the lungs every 6 (six) hours as needed  for wheezing or shortness of breath.    [provider]  ALPRAZolam (XANAX) 1 MG tablet TAKE (1) TABLET BY MOUTH (4) TIMES DAILY. Patient taking differently: Take 1 mg by mouth in the morning, at noon, in the evening, and at bedtime. 09/17/20   [name], MD  Ascorbic Acid (VITAMIN C) 1000 MG tablet Take 1,000 mg by mouth daily.    [provider]  aspirin EC 81 MG tablet Take 81 mg by mouth daily.    [provider]  atorvastatin (LIPITOR) 40 MG tablet TAKE (1) TABLET BY MOUTH ONCE DAILY. Patient taking differently: Take 40 mg by mouth daily. 06/22/20   [name], MD  calcium-vitamin D (OSCAL WITH D) 250-125 MG-UNIT tablet Take 1 tablet by mouth daily.    [provider]  cholecalciferol (VITAMIN D3) 25 MCG (1000 UT) tablet Take 1,000 Units by mouth daily.    [provider]  docusate sodium (COLACE) 100 MG capsule Take two capsules at bedtime starting the day you receive chemotherapy and then for four days after 12/29/19   Derek Jack, MD  gabapentin (NEURONTIN) 300 MG capsule TAKE (2) CAPSULES BY MOUTH THREE TIMES DAILY. Patient taking differently: Take 600 mg by mouth 3 (three) times daily. 04/23/20   Derek Jack, MD  hydrOXYzine (ATARAX/VISTARIL) 50 MG tablet TAKE ONE TABLET BY MOUTH AT BEDTIME AS NEEDED FOR SLEEP. Patient taking differently: Take 50 mg by mouth at bedtime as needed (sleep). 07/02/20   [name], MD  mirtazapine (REMERON) 15 MG tablet Take 2 tablets (30 mg total) by mouth at bedtime. 08/13/20   Derek Jack, MD  mirtazapine (REMERON) 15 MG tablet TAKE (1) TABLET BY MOUTH AT BEDTIME. Patient taking  differently: Take 30 mg by mouth at bedtime. 08/13/20   Derek Jack, MD  mirtazapine (REMERON) 15 MG tablet TAKE (1) TABLET BY MOUTH AT BEDTIME. Patient taking differently: Take 15 mg by mouth at bedtime. 08/13/20   Derek Jack, MD  Multiple Vitamins-Minerals (CENTRUM SILVER 50+MEN) TABS Take 1 tablet by mouth daily.    [provider]  ondansetron (ZOFRAN) 8 MG tablet Take 1 tablet (8 mg total) by mouth 2 (two) times daily as needed. Start on the third day after cisplatin chemotherapy. 10/05/17   Holley Bouche, NP  oxyCODONE-acetaminophen (PERCOCET/ROXICET) 5-325 MG tablet TAKE 1 TABLET EVERY 8 HOURS AS NEEDED FOR SEVERE PAIN. Patient taking differently: Take 1 tablet by mouth every 8 (eight) hours as needed for severe pain. 07/05/20   Benay Pike, MD  prochlorperazine (COMPAZINE) 10 MG tablet Take 1 tablet (10 mg total) by mouth every 6 (six) hours as needed (Nausea or vomiting). 11/18/17   Holley Bouche, NP  senna (SENOKOT) 8.6 MG tablet Take 3 tablets by mouth daily.     [provider]  vitamin B-12 (CYANOCOBALAMIN) 100 MCG tablet Take 100 mcg by mouth daily.    [provider]   Allergies  Allergen Reactions  . Codeine Nausea Only  . Niaspan [Niacin Er]    Review of Systems  Constitutional: Positive for appetite change.  Psychiatric/Behavioral: Negative for sleep disturbance. The patient is not nervous/anxious.     Physical Exam Vitals and nursing note reviewed.  Constitutional:      Appearance: He is ill-appearing.     Comments: cachetic  Eyes:     Comments: Multiple facial bruises  Pulmonary:     Effort: Pulmonary effort is normal.  Skin:    General: Skin is warm  and dry.     Coloration: Skin is pale.  Neurological:     Mental Status: He is alert.     Comments: Pleasantly confused     Vital Signs: BP (!) 144/81 (BP Location: Left Arm)   Pulse 79   Temp 97.8 F (36.6 C) (Oral)   Resp 18   Ht 5' 9"  (1.753 m)   Wt  63.8 kg   SpO2 95%   BMI 20.77 kg/m  Pain Scale: 0-10   Pain Score: 0-No pain   SpO2: SpO2: 95 % O2 Device:SpO2: 95 % O2 Flow Rate: .O2 Flow Rate (L/min): 2 L/min  IO: Intake/output summary:   Intake/Output Summary (Last 24 hours) at 10/21/2020 1224 Last data filed at 10/20/2020 2300 Gross per 24 hour  Intake 10 ml  Output --  Net 10 ml    LBM: Last BM Date: 10/20/20 Baseline Weight: Weight: 66.7 kg Most recent weight: Weight: 63.8 kg     Palliative Assessment/Data: PPS: 10%     Thank you for this consult. Palliative medicine will continue to follow and assist as needed.   Time In: 0911 Time Out: 1021 Time Total: 70 mins Greater than 50%  of this time was spent counseling and coordinating care related to the above assessment and plan.  Signed by: Mariana Kaufman, AGNP-C Palliative Medicine    Please contact Palliative Medicine Team phone at [phone] for questions and concerns.  For individual provider: See Shea Evans

## 2020-10-21 NOTE — Progress Notes (Signed)
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  Patient was found by brother on 3/30 after he had been unable to reach patient for unknown period of time (1 to 3 days suspected).  Patient fracture left frontal sinus, L3 transverse process, displaced angulated mildly comminuted left femoral neck fracture.  AKI with rhabdomyolysis.  On 3/31 patient develop worsening hypoxia and respiratory distress, intubated.  Subsequently extubated 4/2 and transferred out of the ICU on 4/3.  The evening of 4/3--4/4 he developed increased work of breathing and increased oxygen needs requiring intubation.  This was thought to be related to an aspiration event.  Significant events: -3/30 admitted to [facility] for encephalopathy. Facial fx, L3 TP fx, Hip fx.  -3/31 non op from [place] standpoint. Ortho planned to OR 3/31 however acute hypoxic respiratory failure, transferred to ICU and emergently intubated.  -3/31 ETT >>4/1 -3/31 bronchoscopy with BAL left lower lobe -4/1 LT  Hip Hemiarthroplasty (Anterior Approach) -4/4 ETT>>4/6 Currently stable on room air.  PT is recommending SNF. Palliative care was also consulted.  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 [facility])   Pressure injury of skin   Closed fracture of left hip (HCC)   Protein-calorie malnutrition, severe   Status post total replacement of left hip   Non-traumatic rhabdomyolysis   Fall   Left-sided weakness   Acute respiratory failure with hypoxia (HCC)   1-Acute Hypoxic Respiratory Failure, Aspiration Pneumonia, COPD Emphysema Mucus Plug 4/05. History of a small cell lung cancer status post chemotherapy with lurbinectedin  last cycle 09/12/20. -Extubated 4\6 -Completed 7 days of cefepime 4/05. -Brovana/Yupelri nebulizer treatments and PRN albuterol.  -received Solumedrol on 4/05 during mucus plug event, prednisone for COPD.  -Patient  To be discharge on Incruse and Dulera. On nebulizer while inpatient.  -Stable on Room Air.   2-Nausea vomiting: Continue to feel nauseated during attempt to give p.o. In setting of constipation, KUB nonobstructive bowel pattern  Nutrition;  On tube feeding.  Through core track. Swallow evaluation was attempted again today but patient was keeping pured diet and his mouth, current recommendations remained to maintain n.p.o. and continue feeding through tube.  If patient needs long-term tube feeding-we will order PEG tube with IR.  GI, Diarrhea; resolved after stopping laxative and change in his tube feed formula.  Acute metabolic encephalopathy: Delirium.  Appears to be at baseline now. Likely in the setting of acute respiratory failure, due to aspiration pneumonia and ICU delirium. Continue Gabapentin,dose was reduced to 100 mg 3 times daily from 300 mg  Metastatic small cell carcinoma -Lung, liver -Follows with Dr. Delton Coombes.  -CT head; No acute intracranial pathology   Left femoral Neck Fx S/P Hemiarthroplasty:  Ortho following.  Patient remove wound vac last night.  Nurse will page ortho and inform them. /  PT following-recommending SNF placement.  Left Facial Fx, L 3 TP Fx:  Neurosurgery  evaluated , no surgical intervention needed.  Neurosurgery recommend ENT consult. Spoke with ENT on called for [place] recommend Conservative management, Fracture is non displaced, does not need anything acute.   Rhabdomyolysis.  Resolved with IV fluids.   Thrombocytopenia, anemia; Platelet improving.  Hb stable and seems improving  slowly.  S/p PRBC on 10/17/2020  Tabbacco abuse. Needs counseling.   Sepsis was ruled out.   Pressure Injury 10/11/20 Sacrum Medial Deep Tissue  Pressure Injury - Purple or maroon localized area of discolored intact skin or blood-filled blister due to damage of underlying soft tissue from pressure and/or shear. (Active)  10/11/20 0220  Location: Sacrum  Location Orientation: Medial  Staging: Deep Tissue Pressure Injury - Purple or maroon localized area of discolored intact skin or blood-filled blister due to damage of underlying soft tissue from pressure and/or shear.  Wound Description (Comments):   Present on Admission: Yes     Pressure Injury 10/15/20 Heel Right Deep Tissue Pressure Injury - Purple or maroon localized area of discolored intact skin or blood-filled blister due to damage of underlying soft tissue from pressure and/or shear. (Active)  10/15/20 0323  Location: Heel  Location Orientation: Right  Staging: Deep Tissue Pressure Injury - Purple or maroon localized area of discolored intact skin or blood-filled blister due to damage of underlying soft tissue from pressure and/or shear.  Wound Description (Comments):   Present on Admission: No    Nutrition Problem: Severe Malnutrition Etiology: chronic illness (small cell carcinoma of the lung)  Signs/Symptoms: severe muscle depletion,severe fat depletion  Interventions: Tube feeding,Prostat  Estimated body mass index is 20.77 kg/m as calculated from the following:   Height as of this encounter: 5' 9" (1.753 m).   Weight as of this encounter: 63.8 kg.   DVT prophylaxis: Heparin  Code Status: Full Code Family Communication: Unable to reach brother on phone today. Disposition Plan:  Status is: Inpatient  Remains inpatient appropriate because:IV treatments appropriate due to intensity of illness or inability to take PO   Dispo:  Patient From: Home  Planned Disposition: [place]  Medically stable for discharge: No   Patient is very sick with multiple commodities and advice cancer, full code at this time.  Very high risk for deterioration and  death-palliative care was consulted and they are trying to reach family but according to patient he wants everything to make him feel better.  Consultants:   Neurosurgery  Ortho  CCM    Procedures:     Antimicrobials:   Subjective: Patient has no new complaint today when seen this morning.  Stating that he is feeling little better as compared to yesterday.  Per patient thinking about swallowing anything causes him to become nauseated.  He was interested having a PEG tube placed for feeding.  Objective: Vitals:   10/20/20 2053 10/21/20 0015 10/21/20 0358 10/21/20 1321  BP: (!) 148/63 (!) 142/72 (!) 144/81 118/61  Pulse: 75  79 92  Resp: _0 Temp: 97.6 F (36.4 C)  97.8 F (36.6 C) 97.7 F (36.5 C)  TempSrc: Oral  Oral   SpO2: 95%  95% 91%  Weight:   63.8 kg   Height:        Intake/Output Summary (Last 24 hours) at 10/21/2020 1607 Last data filed at 10/20/2020 2300 Gross per 24 hour  Intake 10 ml  Output --  Net 10 ml   Filed Weights   10/18/20 0426 10/20/20 0346 10/21/20 0358  Weight: 67.1 kg 65.8 kg 63.8 kg    Examination:  General.  Frail, malnourished man, in no acute distress. Pulmonary.  Lungs clear bilaterally, normal respiratory effort. CV.  Regular rate and rhythm, no JVD, rub or murmur. Abdomen.  Soft, nontender, nondistended, BS positive. CNS.  Alert and oriented to self  and place.  No focal neurologic deficit. Extremities.  No edema, no cyanosis, pulses intact and symmetrical. Psychiatry.  Judgment and insight appears impaired.  Data Reviewed: I have personally reviewed following labs and imaging studies  CBC: Recent Labs  Lab 10/16/20 0434 10/17/20 0857 10/18/20 0447 10/19/20 1145 10/20/20 0206  WBC 7.0 7.0 6.6 6.2 6.8  HGB 8.8* 6.5* 7.4* 9.1* 9.5*  HCT 26.7* 19.2* 21.8* 27.6* 28.1*  MCV 96.4 93.2 93.2 93.6 90.9  PLT 94* 84* 104* 146* 119   Basic Metabolic Panel: Recent Labs  Lab 10/15/20 1654 10/16/20 0044 10/16/20 0150  10/16/20 0434 10/16/20 1704 10/17/20 0430 10/17/20 0855 10/18/20 0447 10/18/20 0733 10/19/20 0544 10/20/20 0206  NA  --  139   < > 140  --   --  138 139  --  137 134*  K  --  3.7   < > 3.8  --   --  3.9 3.3*  --  3.7 3.7  CL  --  110  --  109  --   --  106 107  --  104 102  CO2  --  25  --  24  --   --  25 28  --  28 25  GLUCOSE  --  121*  --  123*  --   --  138* 114*  --  124* 120*  BUN  --  20  --  21  --   --  25* 30*  --  22 20  CREATININE  --  0.86  --  0.84  --   --  0.71 0.75  --  0.68 0.66  CALCIUM  --  8.0*  --  8.0*  --   --  8.1* 8.3*  --  8.3* 8.6*  MG 1.8 2.0  --  1.8 2.3 2.3  --  2.3  --   --   --   PHOS 1.7*  --   --  2.5 4.0 2.5  --   --  2.3*  --   --    < > = values in this interval not displayed.   GFR: Estimated Creatinine Clearance: 78.6 mL/min (by C-G formula based on SCr of 0.66 mg/dL). Liver Function Tests: Recent Labs  Lab 10/15/20 0449 10/16/20 0434  AST 42* 40  ALT 47* 42  ALKPHOS 80 69  BILITOT 0.8 0.6  PROT 5.2* 5.1*  ALBUMIN 2.1* 2.1*   No results for input(s): LIPASE, AMYLASE in the last 168 hours. No results for input(s): AMMONIA in the last 168 hours. Coagulation Profile: No results for input(s): INR, PROTIME in the last 168 hours. Cardiac Enzymes: No results for input(s): CKTOTAL, CKMB, CKMBINDEX, TROPONINI in the last 168 hours. BNP (last 3 results) No results for input(s): PROBNP in the last 8760 hours. HbA1C: Recent Labs    10/18/20 1640  HGBA1C 5.8*   CBG: Recent Labs  Lab 10/20/20 1928 10/21/20 0026 10/21/20 0355 10/21/20 0805 10/21/20 1154  GLUCAP 150* 130* 120* 123* 104*   Lipid Profile: No results for input(s): CHOL, HDL, LDLCALC, TRIG, CHOLHDL, LDLDIRECT in the last 72 hours. Thyroid Function Tests: No results for input(s): TSH, T4TOTAL, FREET4, T3FREE, THYROIDAB in the last 72 hours. Anemia Panel: No results for input(s): VITAMINB12, FOLATE, FERRITIN, TIBC, IRON, RETICCTPCT in the last 72 hours. Sepsis  Labs: No results for input(s): PROCALCITON, LATICACIDVEN in the last 168 hours.  Recent Results (from the past 240 hour(s))  Culture, blood (Routine X 2) w Reflex to  ID Panel     Status: None   Collection Time: 10/11/20  7:54 PM   Specimen: BLOOD RIGHT HAND  Result Value Ref Range Status   Specimen Description BLOOD RIGHT HAND  Final   Special Requests   Final    BOTTLES DRAWN AEROBIC AND ANAEROBIC Blood Culture adequate volume   Culture   Final    NO GROWTH 5 DAYS Performed at [facility], [address] [place], [place] [address]    Report Status 10/16/2020 FINAL  Final  Culture, blood (Routine X 2) w Reflex to ID Panel     Status: None   Collection Time: 10/11/20  8:07 PM   Specimen: BLOOD LEFT HAND  Result Value Ref Range Status   Specimen Description BLOOD LEFT HAND  Final   Special Requests   Final    BOTTLES DRAWN AEROBIC AND ANAEROBIC Blood Culture adequate volume   Culture   Final    NO GROWTH 5 DAYS Performed at [facility], [place] [address] [place], [place] [address]    Report Status 10/16/2020 FINAL  Final  Culture, Respiratory w Gram Stain     Status: None   Collection Time: 10/15/20  4:26 AM   Specimen: Tracheal Aspirate; Respiratory  Result Value Ref Range Status   Specimen Description TRACHEAL ASPIRATE  Final   Special Requests NONE  Final   Gram Stain   Final    NO WBC SEEN RARE BUDDING YEAST SEEN Performed at [facility], [address], [place], [place] [address]    Culture FEW CANDIDA KRUSEI  Final   Report Status 10/17/2020 FINAL  Final  MRSA PCR Screening     Status: None   Collection Time: 10/15/20  9:45 AM   Specimen: Nasopharyngeal  Result Value Ref Range Status   MRSA by PCR NEGATIVE NEGATIVE Final    Comment:        The GeneXpert MRSA Assay (FDA approved for NASAL specimens only), is one component of a comprehensive MRSA colonization surveillance program. It is not intended to diagnose MRSA infection nor to guide  or monitor treatment for MRSA infections. Performed at [facility], [place] [address], [place], [place] [address]      Radiology Studies: No results found.  Scheduled Meds: . arformoterol  15 mcg Nebulization BID  . aspirin  81 mg Per Tube Daily  . chlorhexidine gluconate (MEDLINE KIT)  15 mL Mouth Rinse BID  . Chlorhexidine Gluconate Cloth  6 each Topical Daily  . doxazosin  2 mg Per Tube Daily  . famotidine  20 mg Per Tube BID  . feeding supplement (PROSource TF)  45 mL Per Tube BID  . free water  150 mL Per Tube Q4H  . gabapentin  100 mg Per Tube Q8H  . heparin injection (subcutaneous)  5,000 Units Subcutaneous Q8H  . hydrALAZINE  10 mg Intravenous Once  . insulin aspart  0-9 Units Subcutaneous Q4H  . mouth rinse  15 mL Mouth Rinse q12n4p  . mirtazapine  30 mg Per Tube QHS  . nicotine  14 mg Transdermal Daily  . revefenacin  175 mcg Nebulization Daily  . sodium chloride flush  10-40 mL Intracatheter Q12H   Continuous Infusions: . sodium chloride 10 mL/hr at 10/19/20 1500  . feeding supplement (JEVITY 1.5 CAL/FIBER) 1,000 mL (10/21/20 1147)     LOS: 11 days    Time spent: 30 minutes.    Lorella Nimrod, MD [facility]  If 7PM-7AM, please contact night-coverage [email or web address]  10/21/2020, 4:07 PM

## 2020-10-21 NOTE — Plan of Care (Signed)
  Problem: Education: Goal: Verbalization of understanding the information provided (i.e., activity precautions, restrictions, etc) will improve Outcome: Progressing   

## 2020-10-22 ENCOUNTER — Inpatient Hospital Stay (HOSPITAL_COMMUNITY): Payer: Medicare HMO

## 2020-10-22 DIAGNOSIS — R4182 Altered mental status, unspecified: Secondary | ICD-10-CM

## 2020-10-22 DIAGNOSIS — R0603 Acute respiratory distress: Secondary | ICD-10-CM | POA: Diagnosis not present

## 2020-10-22 DIAGNOSIS — E43 Unspecified severe protein-calorie malnutrition: Secondary | ICD-10-CM | POA: Diagnosis not present

## 2020-10-22 DIAGNOSIS — Z7189 Other specified counseling: Secondary | ICD-10-CM | POA: Diagnosis not present

## 2020-10-22 DIAGNOSIS — Z515 Encounter for palliative care: Secondary | ICD-10-CM | POA: Diagnosis not present

## 2020-10-22 DIAGNOSIS — C3490 Malignant neoplasm of unspecified part of unspecified bronchus or lung: Secondary | ICD-10-CM | POA: Diagnosis not present

## 2020-10-22 DIAGNOSIS — S72002A Fracture of unspecified part of neck of left femur, initial encounter for closed fracture: Secondary | ICD-10-CM | POA: Diagnosis not present

## 2020-10-22 DIAGNOSIS — M6282 Rhabdomyolysis: Secondary | ICD-10-CM | POA: Diagnosis not present

## 2020-10-22 DIAGNOSIS — Z96642 Presence of left artificial hip joint: Secondary | ICD-10-CM | POA: Diagnosis not present

## 2020-10-22 DIAGNOSIS — W19XXXA Unspecified fall, initial encounter: Secondary | ICD-10-CM | POA: Diagnosis not present

## 2020-10-22 LAB — GLUCOSE, CAPILLARY
Glucose-Capillary: 108 mg/dL — ABNORMAL HIGH (ref 70–99)
Glucose-Capillary: 136 mg/dL — ABNORMAL HIGH (ref 70–99)
Glucose-Capillary: 129 mg/dL — ABNORMAL HIGH (ref 70–99)
Glucose-Capillary: 129 mg/dL — ABNORMAL HIGH (ref 70–99)
Glucose-Capillary: 119 mg/dL — ABNORMAL HIGH (ref 70–99)

## 2020-10-22 MED ORDER — RESOURCE THICKENUP CLEAR PO POWD
ORAL | Status: DC | PRN
  Filled 2020-10-22 (×2): qty 125

## 2020-10-22 NOTE — Plan of Care (Signed)
  Problem: Activity: Goal: Ability to ambulate and perform ADLs will improve Outcome: Progressing   Problem: Pain Management: Goal: Pain level will decrease Outcome: Progressing   Problem: Nutrition: Goal: Adequate nutrition will be maintained Outcome: Progressing   Problem: Coping: Goal: Level of anxiety will decrease Outcome: Progressing   Problem: Elimination: Goal: Will not experience complications related to bowel motility Outcome: Progressing   Problem: Pain Managment: Goal: General experience of comfort will improve Outcome: Progressing   Problem: Skin Integrity: Goal: Risk for impaired skin integrity will decrease Outcome: Progressing

## 2020-10-22 NOTE — Progress Notes (Signed)
Physical Therapy Treatment Patient Details Name: [name] MRN: [id number] DOB: 1951/06/01 Today's Date: 10/22/2020    History of Present Illness Pt is 70 yo admitted 3/30 after fall and found by brother with unknown down time (1-3 days). Fractured L frontal sinus,L L3 transverse process, displaced angulated mildly comminuted L femoral neck fx. AKI with rhabdo. 3/31 pt with hypoxia requiring intubation, extubated 4/1. Pt s/p left anterior hip hemiarthroplasty 4/1. Re-intubated 4/4 for hypoxemia and transfered to ICU, extubated 10/17/20. PMhx:hx tobacco abuse, CAD, COPD, Small cell carcinoma mets to lung liver    PT Comments    Continuing work on functional mobility and activity tolerance;  Good participation, and noted very nice L hip ROM and control; Session conducted on 6L supplemantal O2, and sats ranged 88%- 94%; Able to stand for up to approx 60 seconds before needing to sit down (notably little warning when pt sits -- stay near bed or keep recliner close); decr activity tolerance; Serial BPs as follows:     10/22/20 1030 10/22/20 1035 10/22/20 1037  Orthostatic Sitting  BP- Sitting 123/57 (map 77) 125/51 (after attempt to get BP in standing; map 63) 109/65 (bed in semi-chair position; map70)  Pulse- Sitting 102 123 98  Orthostatic Standing at 0 minutes  BP- Standing at 0 minutes 107/55 (map 71)  --   --   Pulse- Standing at 0 minutes 114  --   --      Follow Up Recommendations  SNF;Supervision/Assistance - 24 hour     Equipment Recommendations  Rolling walker with 5" wheels;3in1 (PT)    Recommendations for Other Services OT consult     Precautions / Restrictions Precautions Precautions: Fall Precaution Comments: [place], rectal tube, O2 Restrictions LLE Weight Bearing: Weight bearing as tolerated    Mobility  Bed Mobility Overal bed mobility: Needs Assistance Bed Mobility: Supine to Sit;Sit to Supine     Supine to sit: Min assist;HOB elevated Sit to  supine: Mod assist;+2 for physical assistance;+2 for safety/equipment   General bed mobility comments: Use of bed rails and cues to sit; return to supine with assist with trunk and legs    Transfers Overall transfer level: Needs assistance Equipment used: Rolling walker (2 wheeled) Transfers: Sit to/from Stand Sit to Stand: Mod assist;+2 physical assistance;+2 safety/equipment            Ambulation/Gait Ambulation/Gait assistance: Mod assist;+2 physical assistance Gait Distance (Feet): 3 Feet (sidesteps towards HOB) Assistive device: Rolling walker (2 wheeled) Gait Pattern/deviations: Shuffle     General Gait Details: Cues to self monitor for activity tolerance; unsteady, and reliant on Bil UE support on RW; little time between when pt states he wants to sit down and when he sits   Stairs             Wheelchair Mobility    Modified Rankin (Stroke Patients Only)       Balance     Sitting balance-Leahy Scale: Fair       Standing balance-Leahy Scale: Poor Standing balance comment: Requiring min-mod A of 2 with bil HHA to stand; goes to sit with little warning                            Cognition Arousal/Alertness: Awake/alert Behavior During Therapy: WFL for tasks assessed/performed Overall Cognitive Status: Impaired/Different from baseline  General Comments: Pt is easily distracted and requires cues to follow commands consistently and for safety      Exercises      General Comments General comments (skin integrity, edema, etc.):       Pertinent Vitals/Pain Pain Assessment: No/denies pain Pain Intervention(s): Monitored during session    Home Living                      Prior Function            PT Goals (current goals can now be found in the care plan section) Acute Rehab PT Goals Patient Stated Goal: Did not specifically state, but seemed pleased to be able to get up PT  Goal Formulation: With patient/family Time For Goal Achievement: 10/27/20 Potential to Achieve Goals: Fair Progress towards PT goals: Progressing toward goals    Frequency    Min 3X/week      PT Plan Current plan remains appropriate    Co-evaluation              AM-PAC PT "6 Clicks" Mobility   Outcome Measure  Help needed turning from your back to your side while in a flat bed without using bedrails?: A Little Help needed moving from lying on your back to sitting on the side of a flat bed without using bedrails?: A Lot Help needed moving to and from a bed to a chair (including a wheelchair)?: A Lot Help needed standing up from a chair using your arms (e.g., wheelchair or bedside chair)?: A Lot Help needed to walk in hospital room?: A Lot Help needed climbing 3-5 steps with a railing? : Total 6 Click Score: 12    End of Session Equipment Utilized During Treatment: Oxygen;Gait belt Activity Tolerance: Patient tolerated treatment well Patient left: in bed;with call bell/phone within reach;with restraints reapplied;with bed alarm set Nurse Communication: Mobility status PT Visit Diagnosis: Other abnormalities of gait and mobility (R26.89);Difficulty in walking, not elsewhere classified (R26.2);Muscle weakness (generalized) (M62.81)     Time: 5400-8676 PT Time Calculation (min) (ACUTE ONLY): 29 min  Charges:  $Gait Training: 8-22 mins $Therapeutic Activity: 8-22 mins                     Roney Marion, PT  Acute Rehabilitation Services Pager [phone] Office [place] 10/22/2020, 1:55 PM

## 2020-10-22 NOTE — Progress Notes (Signed)
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 on 3/30 for encephalopathy following a fall with associated facial fractures.  Patient was found by brother on 3/30 after he had been unable to reach patient for unknown period of time (1 to 3 days suspected).  Patient fracture left frontal sinus, L3 transverse process, displaced angulated mildly comminuted left femoral neck fracture.  AKI with rhabdomyolysis.  On 3/31 patient develop worsening hypoxia and respiratory distress, intubated.  Subsequently extubated 4/2 and transferred out of the ICU on 4/3.  The evening of 4/3--4/4 he developed increased work of breathing and increased oxygen needs requiring intubation.  This was thought to be related to an aspiration event.  Significant events: -3/30 admitted to [facility] for encephalopathy. Facial fx, L3 TP fx, Hip fx.  -3/31 non op from [place] standpoint. Ortho planned to OR 3/31 however acute hypoxic respiratory failure, transferred to ICU and emergently intubated.  -3/31 ETT >>4/1 -3/31 bronchoscopy with BAL left lower lobe -4/1 LT  Hip Hemiarthroplasty (Anterior Approach) -4/4 ETT>>4/6 Currently stable on room air.  PT is recommending SNF. Palliative care was also consulted.  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 [facility])   Pressure injury of skin   Closed fracture of left hip (HCC)   Protein-calorie malnutrition, severe   Status post total replacement of left hip   Non-traumatic rhabdomyolysis   Fall   Left-sided weakness   Acute respiratory failure with hypoxia (HCC)   1-Acute Hypoxic Respiratory Failure, Aspiration Pneumonia, COPD Emphysema Mucus Plug 4/05. History of a small cell lung cancer status post chemotherapy with lurbinectedin  last cycle 09/12/20. -Extubated 4\6 -Completed 7 days of cefepime 4/05. -Brovana/Yupelri nebulizer treatments and PRN albuterol.  -received Solumedrol on 4/05 during mucus plug event, prednisone for COPD.  -Patient  To be discharge on Incruse and Dulera. On nebulizer while inpatient.  Patient became hypoxic again last night requiring 4 to 6 L of oxygen. -Chest PT to see if he can bring up his secretions.  2-Nausea vomiting: Continue to feel nauseated during attempt to give p.o. In setting of constipation, KUB nonobstructive bowel pattern  Nutrition;  On tube feeding.  Through core track. Swallow evaluation was attempted again today but patient was keeping pured diet and his mouth, current recommendations remained to maintain n.p.o. and continue feeding through tube.  If patient needs long-term tube feeding-we will order PEG tube with IR. -Going for barium swallow studies today.  GI, Diarrhea; resolved after stopping laxative and change in his tube feed formula.  Acute metabolic encephalopathy: Delirium.  Appears to be at baseline now. Likely in the setting of acute respiratory failure, due to aspiration pneumonia and ICU delirium. Continue Gabapentin,dose was reduced to 100 mg 3 times daily from 300 mg  Metastatic small cell carcinoma -Lung, liver -Follows with Dr. Delton Coombes.  -CT head; No acute intracranial pathology   Left femoral Neck Fx S/P Hemiarthroplasty:  Ortho following.  Patient remove wound vac last night.  Nurse will page ortho and inform them. /  PT following-recommending SNF placement.  Left Facial Fx, L 3 TP Fx:  Neurosurgery  evaluated , no surgical intervention needed.  Neurosurgery recommend ENT consult. Spoke with ENT on called for [place] recommend Conservative management, Fracture is non displaced,  does not need anything acute.   Rhabdomyolysis.  Resolved with IV fluids.   Thrombocytopenia, anemia; Platelet improving.  Hb stable and seems improving slowly.   S/p PRBC on 10/17/2020  Tabbacco abuse. Needs counseling.   Sepsis was ruled out.   Pressure Injury 10/11/20 Sacrum Medial Deep Tissue Pressure Injury - Purple or maroon localized area of discolored intact skin or blood-filled blister due to damage of underlying soft tissue from pressure and/or shear. (Active)  10/11/20 0220  Location: Sacrum  Location Orientation: Medial  Staging: Deep Tissue Pressure Injury - Purple or maroon localized area of discolored intact skin or blood-filled blister due to damage of underlying soft tissue from pressure and/or shear.  Wound Description (Comments):   Present on Admission: Yes     Pressure Injury 10/15/20 Heel Right Deep Tissue Pressure Injury - Purple or maroon localized area of discolored intact skin or blood-filled blister due to damage of underlying soft tissue from pressure and/or shear. (Active)  10/15/20 0323  Location: Heel  Location Orientation: Right  Staging: Deep Tissue Pressure Injury - Purple or maroon localized area of discolored intact skin or blood-filled blister due to damage of underlying soft tissue from pressure and/or shear.  Wound Description (Comments):   Present on Admission: No    Nutrition Problem: Severe Malnutrition Etiology: chronic illness (small cell carcinoma of the lung)  Signs/Symptoms: severe muscle depletion,severe fat depletion  Interventions: Tube feeding,Prostat  Estimated body mass index is 20.77 kg/m as calculated from the following:   Height as of this encounter: _0  (1.753 m).   Weight as of this encounter: 63.8 kg.   DVT prophylaxis: Heparin  Code Status: Full Code Family Communication: Unable to reach brother on phone today. Disposition Plan:  Status is: Inpatient  Remains inpatient appropriate because:IV treatments appropriate due to intensity of illness or inability to take PO   Dispo:  Patient From: Home  Planned Disposition: [place]  Medically stable for  discharge: No   Patient is very sick with multiple commodities and advice cancer, full code at this time.  Very high risk for deterioration and death-palliative care was consulted and they are trying to reach family but according to patient he wants everything to make him feel better.  Consultants:   Neurosurgery  Ortho  CCM    Procedures:     Antimicrobials:   Subjective: Patient was feeling more weak today.  He was back on oxygen.  Wants everything needs to be done to make him feel better, I he wants to be reintubated if needed and wants to continue with tube feed if unable to swallow.  Objective: Vitals:   10/22/20 0935 10/22/20 1300 10/22/20 1517 10/22/20 1636  BP:  (!) 111/49  (!) 112/56  Pulse: 82 91  96  Resp: _1 Temp:  98.4 F (36.9 C)  98.5 F (36.9 C)  TempSrc:      SpO2: 92% 93% 97% 94%  Weight:      Height:        Intake/Output Summary (Last 24 hours) at 10/22/2020 1638 Last data filed at 10/22/2020 0453 Gross per 24 hour  Intake 10 ml  Output 1250 ml  Net -1240 ml   Filed Weights   10/18/20 0426 10/20/20 0346 10/21/20 0358  Weight: 67.1 kg 65.8 kg 63.8 kg    Examination:  General.  Frail, severely malnourished gentleman, in no acute distress. Pulmonary.  Few scattered rhonchi, normal respiratory effort. CV.  Regular rate and  rhythm, no JVD, rub or murmur. Abdomen.  Soft, nontender, nondistended, BS positive. CNS.  Alert and oriented x3.  No focal neurologic deficit. Extremities.  No edema, no cyanosis, pulses intact and symmetrical. Psychiatry.  Judgment and insight appears normal.  Data Reviewed: I have personally reviewed following labs and imaging studies  CBC: Recent Labs  Lab 10/16/20 0434 10/17/20 0857 10/18/20 0447 10/19/20 1145 10/20/20 0206  WBC 7.0 7.0 6.6 6.2 6.8  HGB 8.8* 6.5* 7.4* 9.1* 9.5*  HCT 26.7* 19.2* 21.8* 27.6* 28.1*  MCV 96.4 93.2 93.2 93.6 90.9  PLT 94* 84* 104* 146* 517   Basic Metabolic  Panel: Recent Labs  Lab 10/15/20 1654 10/15/20 1654 10/16/20 0044 10/16/20 0150 10/16/20 0434 10/16/20 1704 10/17/20 0430 10/17/20 0855 10/18/20 0447 10/18/20 0733 10/19/20 0544 10/20/20 0206  NA  --   --  139   < > 140  --   --  138 139  --  137 134*  K  --   --  3.7   < > 3.8  --   --  3.9 3.3*  --  3.7 3.7  CL  --    < > 110  --  109  --   --  106 107  --  104 102  CO2  --    < > 25  --  24  --   --  25 28  --  28 25  GLUCOSE  --    < > 121*  --  123*  --   --  138* 114*  --  124* 120*  BUN  --    < > 20  --  21  --   --  25* 30*  --  22 20  CREATININE  --    < > 0.86  --  0.84  --   --  0.71 0.75  --  0.68 0.66  CALCIUM  --    < > 8.0*  --  8.0*  --   --  8.1* 8.3*  --  8.3* 8.6*  MG 1.8  --  2.0  --  1.8 2.3 2.3  --  2.3  --   --   --   PHOS 1.7*  --   --   --  2.5 4.0 2.5  --   --  2.3*  --   --    < > = values in this interval not displayed.   GFR: Estimated Creatinine Clearance: 78.6 mL/min (by C-G formula based on SCr of 0.66 mg/dL). Liver Function Tests: Recent Labs  Lab 10/16/20 0434  AST 40  ALT 42  ALKPHOS 69  BILITOT 0.6  PROT 5.1*  ALBUMIN 2.1*   No results for input(s): LIPASE, AMYLASE in the last 168 hours. No results for input(s): AMMONIA in the last 168 hours. Coagulation Profile: No results for input(s): INR, PROTIME in the last 168 hours. Cardiac Enzymes: No results for input(s): CKTOTAL, CKMB, CKMBINDEX, TROPONINI in the last 168 hours. BNP (last 3 results) No results for input(s): PROBNP in the last 8760 hours. HbA1C: No results for input(s): HGBA1C in the last 72 hours. CBG: Recent Labs  Lab 10/21/20 2341 10/22/20 0448 10/22/20 0734 10/22/20 1300 10/22/20 1635  GLUCAP 115* 108* 136* 129* 129*   Lipid Profile: No results for input(s): CHOL, HDL, LDLCALC, TRIG, CHOLHDL, LDLDIRECT in the last 72 hours. Thyroid Function Tests: No results for input(s): TSH, T4TOTAL, FREET4, T3FREE, THYROIDAB in the last 72 hours. Anemia Panel: No  results for input(s): VITAMINB12, FOLATE, FERRITIN, TIBC, IRON, RETICCTPCT in the last 72 hours. Sepsis Labs: No results for input(s): PROCALCITON, LATICACIDVEN in the last 168 hours.  Recent Results (from the past 240 hour(s))  Culture, Respiratory w Gram Stain     Status: None   Collection Time: 10/15/20  4:26 AM   Specimen: Tracheal Aspirate; Respiratory  Result Value Ref Range Status   Specimen Description TRACHEAL ASPIRATE  Final   Special Requests NONE  Final   Gram Stain   Final    NO WBC SEEN RARE BUDDING YEAST SEEN Performed at [facility], [address] [place], [place] [address]    Culture FEW CANDIDA KRUSEI  Final   Report Status 10/17/2020 FINAL  Final  MRSA PCR Screening     Status: None   Collection Time: 10/15/20  9:45 AM   Specimen: Nasopharyngeal  Result Value Ref Range Status   MRSA by PCR NEGATIVE NEGATIVE Final    Comment:        The GeneXpert MRSA Assay (FDA approved for NASAL specimens only), is one component of a comprehensive MRSA colonization surveillance program. It is not intended to diagnose MRSA infection nor to guide or monitor treatment for MRSA infections. Performed at [facility], [place] [address] [place],  [address]      Radiology Studies: No results found.  Scheduled Meds: . arformoterol  15 mcg Nebulization BID  . aspirin  81 mg Per Tube Daily  . chlorhexidine gluconate (MEDLINE KIT)  15 mL Mouth Rinse BID  . Chlorhexidine Gluconate Cloth  6 each Topical Daily  . doxazosin  2 mg Per Tube Daily  . famotidine  20 mg Per Tube BID  . feeding supplement (PROSource TF)  45 mL Per Tube BID  . free water  150 mL Per Tube Q4H  . gabapentin  100 mg Per Tube Q8H  . heparin injection (subcutaneous)  5,000 Units Subcutaneous Q8H  . hydrALAZINE  10 mg Intravenous Once  . insulin aspart  0-9 Units Subcutaneous Q4H  . mouth rinse  15 mL Mouth Rinse q12n4p  . mirtazapine  30 mg Per Tube QHS  . nicotine  14 mg Transdermal  Daily  . revefenacin  175 mcg Nebulization Daily  . sodium chloride flush  10-40 mL Intracatheter Q12H   Continuous Infusions: . sodium chloride 10 mL/hr at 10/19/20 1500  . feeding supplement (JEVITY 1.5 CAL/FIBER) 1,000 mL (10/22/20 0934)     LOS: 12 days    Time spent: 30 minutes.    Lorella Nimrod, MD [facility]   If 7PM-7AM, please contact night-coverage [email or web address]  10/22/2020, 4:38 PM

## 2020-10-22 NOTE — Progress Notes (Addendum)
Modified Barium Swallow Progress Note  Patient Details  Name: [name] MRN: [id number] Date of Birth: 07-16-1950  Today's Date: 10/22/2020  Modified Barium Swallow completed.  Full report located under Chart Review in the Imaging Section.  Brief recommendations include the following:  Clinical Impression  Pt presents with oropharyngeal dysphagia characterized by reduced bolus cohesion, a pharyngeal delay, reduced lingual retraction, and reduced anterior laryngeal movement. He demonstrated premature spillage to the valleculae with inconsistent spillover to the pyriform sinuses, base of tongue residue, vallecular residue, pyriform sinus residue, penetration (PAS 3, 5) and inconsistent aspiration (PAS 7) of thin and nectar thick iquids. Aspiration consistently triggered a spontaneous cough which was inconsistently effective in expelling the aspirate. Penetration was improved and aspiration eliminated with reduced bolus sizes of nectar thick and thin liquids via cup; however, pt was unable to consistently reduce bolus size and eliminate use of a posterior head tilt despite cues and prompts. Pharyngeal residue was reduced with use of a liquid wash and pt was able to swallow a barium tablet with honey thick liquids via cup. A dysphagia 2 diet with honey thick liquids will be initiated at this time. However, there is potential for advancement of liquids once pt is able to demonstrate use of swallowing precautions. SLP will follow for treatment.   Swallow Evaluation Recommendations       SLP Diet Recommendations: Dysphagia 2 (Fine chop) solids;Honey thick liquids   Liquid Administration via: Cup;Straw   Medication Administration: Whole meds with puree   Supervision: Staff to assist with self feeding   Compensations: Slow rate;Small sips/bites;Minimize environmental distractions;Follow solids with liquid   Postural Changes: Remain semi-upright after after feeds/meals (Comment);Seated upright  at 90 degrees   Oral Care Recommendations: Oral care BID   Other Recommendations: Order thickener from pharmacy  [place] [address], [place], [place] Office number [phone] Pager [phone]   Horton Marshall 10/22/2020,4:50 PM

## 2020-10-22 NOTE — Plan of Care (Signed)
  Problem: Education: Goal: Verbalization of understanding the information provided (i.e., activity precautions, restrictions, etc) will improve Outcome: Progressing Goal: Individualized Educational Video(s) Outcome: Progressing   Problem: Activity: Goal: Ability to ambulate and perform ADLs will improve Outcome: Progressing   Problem: Clinical Measurements: Goal: Postoperative complications will be avoided or minimized Outcome: Progressing   Problem: Self-Concept: Goal: Ability to maintain and perform role responsibilities to the fullest extent possible will improve Outcome: Progressing   Problem: Pain Management: Goal: Pain level will decrease Outcome: Progressing   Problem: Education: Goal: Knowledge of General Education information will improve Description: Including pain rating scale, medication(s)/side effects and non-pharmacologic comfort measures Outcome: Progressing   Problem: Health Behavior/Discharge Planning: Goal: Ability to manage health-related needs will improve Outcome: Progressing   Problem: Clinical Measurements: Goal: Ability to maintain clinical measurements within normal limits will improve Outcome: Progressing Goal: Will remain free from infection Outcome: Progressing Goal: Diagnostic test results will improve Outcome: Progressing Goal: Respiratory complications will improve Outcome: Progressing Goal: Cardiovascular complication will be avoided Outcome: Progressing   Problem: Activity: Goal: Risk for activity intolerance will decrease Outcome: Progressing   Problem: Nutrition: Goal: Adequate nutrition will be maintained Outcome: Progressing   Problem: Coping: Goal: Level of anxiety will decrease Outcome: Progressing   Problem: Elimination: Goal: Will not experience complications related to bowel motility Outcome: Progressing Goal: Will not experience complications related to urinary retention Outcome: Progressing   Problem: Pain  Managment: Goal: General experience of comfort will improve Outcome: Progressing   Problem: Safety: Goal: Ability to remain free from injury will improve Outcome: Progressing   Problem: Skin Integrity: Goal: Risk for impaired skin integrity will decrease Outcome: Progressing   Problem: Safety: Goal: Non-violent Restraint(s) Outcome: Progressing

## 2020-10-22 NOTE — Progress Notes (Signed)
Additional non face to face encounter-   I spoke with Tiny again. Tiny is not related to patient- he is a good friend. He gave me information to reach Zalen's brother Keiandre Cygan.  I gave Richardson Landry an update on patient's status and concerns as well.  Richardson Landry expressed that there is another sibling- Edwena Felty who should be included in any decision making for patient. Richardson Landry shared that patient's brother Levada Dy has some issues with substance use and frequently becomes unreachable.   I discussed with Richardson Landry the need for a family conference to update on patient's status and discuss further care planning.   Richardson Landry agreed tentatively to meeting tomorrow via phone at [place]. PMT contact information given to [place].   Mariana Kaufman, AGNP-C Palliative Medicine  Total time 49 minutes

## 2020-10-22 NOTE — Progress Notes (Signed)
RT was called by pt's RN stating that pt's SpO2 was reading 85% and pt c/o CP. RT at bedside to find RN had placed pt on 5LNC and pt's SpO2 now 90%. RT increased to [facility] and SpO2 92%. Pt does not appear in any distress. RT will continue to monitor pt.

## 2020-10-22 NOTE — Progress Notes (Signed)
Daily Progress Note   Patient Name: [name]       Date: 10/22/2020 DOB: 09-22-1950  Age: 70 y.o. MRN#: [id number] Attending Physician: Lorella Nimrod, MD Primary Care Physician: [name], MD Admit Date: 10/10/2020  Reason for Consultation/Follow-up: Establishing goals of care  Subjective: Patient in bed. Appears much more lethargic than he was yesterday. Sats dropping into the low 80's- up to 90% with O2 up to 6L.  He is oriented to place and person- but very lethargic. He agrees that he would want to be intubated if his respiratory status declined further.  I have not heard from his brother Levada Dy. Called brother Tiny with update.   Review of Systems  Unable to perform ROS: Acuity of condition    Length of Stay: 12  Current Medications: Scheduled Meds:  . arformoterol  15 mcg Nebulization BID  . aspirin  81 mg Per Tube Daily  . chlorhexidine gluconate (MEDLINE KIT)  15 mL Mouth Rinse BID  . Chlorhexidine Gluconate Cloth  6 each Topical Daily  . doxazosin  2 mg Per Tube Daily  . famotidine  20 mg Per Tube BID  . feeding supplement (PROSource TF)  45 mL Per Tube BID  . free water  150 mL Per Tube Q4H  . gabapentin  100 mg Per Tube Q8H  . heparin injection (subcutaneous)  5,000 Units Subcutaneous Q8H  . hydrALAZINE  10 mg Intravenous Once  . insulin aspart  0-9 Units Subcutaneous Q4H  . mouth rinse  15 mL Mouth Rinse q12n4p  . mirtazapine  30 mg Per Tube QHS  . nicotine  14 mg Transdermal Daily  . revefenacin  175 mcg Nebulization Daily  . sodium chloride flush  10-40 mL Intracatheter Q12H    Continuous Infusions: . sodium chloride 10 mL/hr at 10/19/20 1500  . feeding supplement (JEVITY 1.5 CAL/FIBER) 1,000 mL (10/22/20 0934)    PRN Meds: sodium chloride,  acetaminophen **OR** acetaminophen, albuterol, ondansetron (ZOFRAN) IV, sodium chloride flush  Physical Exam Vitals and nursing note reviewed.  Constitutional:      Appearance: He is ill-appearing.     Comments: Frail, cachetic  Skin:    Coloration: Skin is pale.     Findings: Bruising present.  Neurological:     Mental Status: He is oriented to person, place,  and time.             Vital Signs: BP (!) 114/57   Pulse 82   Temp 98.2 F (36.8 C)   Resp 18   Ht 5' 9"  (1.753 m)   Wt 63.8 kg   SpO2 92%   BMI 20.77 kg/m  SpO2: SpO2: 92 % O2 Device: O2 Device: Nasal Cannula O2 Flow Rate: O2 Flow Rate (L/min): 6 L/min  Intake/output summary:   Intake/Output Summary (Last 24 hours) at 10/22/2020 1025 Last data filed at 10/22/2020 0453 Gross per 24 hour  Intake 10 ml  Output 2250 ml  Net -2240 ml   LBM: Last BM Date: 10/21/20 Baseline Weight: Weight: 66.7 kg Most recent weight: Weight: 63.8 kg       Palliative Assessment/Data: PPS: 10%      Patient Active Problem List   Diagnosis Date Noted  . Status post total replacement of left hip   . Non-traumatic rhabdomyolysis   . Fall   . Left-sided weakness   . Acute respiratory failure with hypoxia ([place])   . Protein-calorie malnutrition, severe 10/16/2020  . Closed fracture of left hip ([place])   . Pressure injury of skin 10/11/2020  . Sepsis without septic shock ([place])   . Left displaced femoral neck fracture ([place])   . Delirium   . Closed fracture of frontal sinus ([place])   . Closed fracture of transverse process of lumbar vertebra ([place])   . Goals of care, counseling/discussion 12/19/2019  . Iron deficiency anemia 09/12/2019  . Small cell lung cancer in adult [facility]) 02/05/2017  . Lymphadenopathy of head and neck 01/22/2017  . Osteopenia 03/30/2015  . Clubbing of fingers 04/14/2014  . Hyperglycemia 10/20/2013  . Smoker   . Closed dislocation of acromioclavicular joint 04/11/2010  . BREAST MASS, LEFT 01/20/2008  .  ONYCHOMYCOSIS, TOENAILS 10/20/2007  . MUSCLE CRAMPS 10/20/2007  . Hypercholesterolemia 05/28/2007  . CATARACT NOS 08/28/2006  . DISTURBANCE, VISUAL NOS 08/19/2006  . COPD (chronic obstructive pulmonary disease) with emphysema ([place]) 08/19/2006  . WITHDRAWAL, DRUG 06/24/2006  . ANXIETY 06/24/2006  . DEPRESSION 06/24/2006  . MYOCARDIAL INFARCTION, HX OF 06/24/2006  . Coronary atherosclerosis 06/24/2006  . CARDIAC ARRHYTHMIA 06/24/2006  . CONSTIPATION 06/24/2006  . OSTEOARTHRITIS 06/24/2006  . LOW BACK PAIN 06/24/2006  . INSOMNIA 06/24/2006    Palliative Care Assessment & Plan   Patient Profile: 70 y.o. 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 Had acute respiratory failure on 3/31 requiring intubation, extubated 4/2 with reintubation on 4/3 due to possible aspiration event, extubated 4/6. He remains confused. Has significant dysphagia and is currently NPO with coretrak in place. Palliative medicine consulted for goals of care for this very ill man with multiple comorbidities who is at high risk of decompensation and dying.    Assessment/Recommendations/Plan  Tiny is attempting to reach brother Levada Dy- I shared my concerns about patient's decompensation and worries that he is declining again Attempted to scheduled an in person meeting with Tiny but he would prefer his brother Levada Dy is present- he is going to try and reach him- gave PMT number to call back I also tried to reach [place] again with no success Also noted patient was on scheduled xanex and percocet prior to admission- I would have concerns about withdrawal- sent message to  Dr. Reesa Chew and will defer to attending team  Goals of Care and Additional Recommendations: Limitations on Scope of Treatment: Full Scope Treatment  Code  Status: Full code  Prognosis:  Unable to determine  Discharge Planning: To Be Determined  Care plan was discussed with Dr. Reesa Chew  Thank you for allowing the Palliative Medicine Team to assist in the care of this patient.   Total time: 42 minutes Greater than 50%  of this time was spent counseling and coordinating care related to the above assessment and plan.  Mariana Kaufman, AGNP-C Palliative Medicine   Please contact Palliative Medicine Team phone at [phone] for questions and concerns.

## 2020-10-23 ENCOUNTER — Inpatient Hospital Stay (HOSPITAL_COMMUNITY): Payer: Medicare HMO

## 2020-10-23 DIAGNOSIS — T148XXA Other injury of unspecified body region, initial encounter: Secondary | ICD-10-CM | POA: Diagnosis not present

## 2020-10-23 DIAGNOSIS — M6282 Rhabdomyolysis: Secondary | ICD-10-CM | POA: Diagnosis not present

## 2020-10-23 DIAGNOSIS — S72002A Fracture of unspecified part of neck of left femur, initial encounter for closed fracture: Secondary | ICD-10-CM | POA: Diagnosis not present

## 2020-10-23 DIAGNOSIS — R0603 Acute respiratory distress: Secondary | ICD-10-CM | POA: Diagnosis not present

## 2020-10-23 DIAGNOSIS — C3490 Malignant neoplasm of unspecified part of unspecified bronchus or lung: Secondary | ICD-10-CM | POA: Diagnosis not present

## 2020-10-23 DIAGNOSIS — W19XXXA Unspecified fall, initial encounter: Secondary | ICD-10-CM | POA: Diagnosis not present

## 2020-10-23 DIAGNOSIS — Z96642 Presence of left artificial hip joint: Secondary | ICD-10-CM | POA: Diagnosis not present

## 2020-10-23 LAB — CBC
HCT: 29 % — ABNORMAL LOW (ref 39.0–52.0)
Hemoglobin: 9.6 g/dL — ABNORMAL LOW (ref 13.0–17.0)
MCH: 31.2 pg (ref 26.0–34.0)
MCHC: 33.1 g/dL (ref 30.0–36.0)
MCV: 94.2 fL (ref 80.0–100.0)
Platelets: 270 10*3/uL (ref 150–400)
RBC: 3.08 MIL/uL — ABNORMAL LOW (ref 4.22–5.81)
RDW: 18.9 % — ABNORMAL HIGH (ref 11.5–15.5)
WBC: 11.1 10*3/uL — ABNORMAL HIGH (ref 4.0–10.5)
nRBC: 0 % (ref 0.0–0.2)

## 2020-10-23 LAB — GLUCOSE, CAPILLARY
Glucose-Capillary: 110 mg/dL — ABNORMAL HIGH (ref 70–99)
Glucose-Capillary: 113 mg/dL — ABNORMAL HIGH (ref 70–99)
Glucose-Capillary: 119 mg/dL — ABNORMAL HIGH (ref 70–99)
Glucose-Capillary: 121 mg/dL — ABNORMAL HIGH (ref 70–99)
Glucose-Capillary: 139 mg/dL — ABNORMAL HIGH (ref 70–99)
Glucose-Capillary: 96 mg/dL (ref 70–99)

## 2020-10-23 LAB — PROCALCITONIN: Procalcitonin: <0.1 ng/mL

## 2020-10-23 MED ORDER — ALPRAZOLAM 0.5 MG PO TABS
0.5000 mg | ORAL_TABLET | Freq: Three times a day (TID) | ORAL | Status: DC | PRN
  Administered 2020-10-23 – 2020-10-29 (×15): 0.5 mg
  Filled 2020-10-23 (×15): qty 1

## 2020-10-23 MED ORDER — SODIUM CHLORIDE 0.9 % IV SOLN
3.0000 g | Freq: Four times a day (QID) | INTRAVENOUS | Status: AC
  Administered 2020-10-23 – 2020-10-28 (×21): 3 g via INTRAVENOUS
  Filled 2020-10-23 (×2): qty 3
  Filled 2020-10-23: qty 8
  Filled 2020-10-23 (×2): qty 3
  Filled 2020-10-23 (×2): qty 8
  Filled 2020-10-23: qty 3
  Filled 2020-10-23 (×4): qty 8
  Filled 2020-10-23: qty 3
  Filled 2020-10-23: qty 8
  Filled 2020-10-23 (×7): qty 3
  Filled 2020-10-23 (×2): qty 8
  Filled 2020-10-23: qty 3
  Filled 2020-10-23: qty 8

## 2020-10-23 MED ORDER — MELATONIN 5 MG PO TABS
5.0000 mg | ORAL_TABLET | Freq: Every day | ORAL | Status: DC
  Administered 2020-10-23 – 2020-11-09 (×17): 5 mg via ORAL
  Filled 2020-10-23 (×16): qty 1

## 2020-10-23 NOTE — Plan of Care (Signed)
  Problem: Education: Goal: Verbalization of understanding the information provided (i.e., activity precautions, restrictions, etc) will improve Outcome: Progressing Goal: Individualized Educational Video(s) Outcome: Progressing   Problem: Activity: Goal: Ability to ambulate and perform ADLs will improve Outcome: Progressing   Problem: Clinical Measurements: Goal: Postoperative complications will be avoided or minimized Outcome: Progressing   Problem: Self-Concept: Goal: Ability to maintain and perform role responsibilities to the fullest extent possible will improve Outcome: Progressing   Problem: Pain Management: Goal: Pain level will decrease Outcome: Progressing   Problem: Education: Goal: Knowledge of General Education information will improve Description: Including pain rating scale, medication(s)/side effects and non-pharmacologic comfort measures Outcome: Progressing   Problem: Health Behavior/Discharge Planning: Goal: Ability to manage health-related needs will improve Outcome: Progressing   Problem: Clinical Measurements: Goal: Ability to maintain clinical measurements within normal limits will improve Outcome: Progressing Goal: Will remain free from infection Outcome: Progressing Goal: Diagnostic test results will improve Outcome: Progressing Goal: Respiratory complications will improve Outcome: Progressing Goal: Cardiovascular complication will be avoided Outcome: Progressing   Problem: Activity: Goal: Risk for activity intolerance will decrease Outcome: Progressing   Problem: Nutrition: Goal: Adequate nutrition will be maintained Outcome: Progressing   Problem: Coping: Goal: Level of anxiety will decrease Outcome: Progressing   Problem: Elimination: Goal: Will not experience complications related to bowel motility Outcome: Progressing Goal: Will not experience complications related to urinary retention Outcome: Progressing   Problem: Pain  Managment: Goal: General experience of comfort will improve Outcome: Progressing   Problem: Safety: Goal: Ability to remain free from injury will improve Outcome: Progressing   Problem: Skin Integrity: Goal: Risk for impaired skin integrity will decrease Outcome: Progressing   Problem: Safety: Goal: Non-violent Restraint(s) Outcome: Progressing

## 2020-10-23 NOTE — Progress Notes (Signed)
Physical Therapy Treatment Patient Details Name: [name] MRN: [id number] DOB: Aug 05, 1950 Today's Date: 10/23/2020    History of Present Illness Pt is 70 yo admitted 3/30 after fall and found by brother with unknown down time (1-3 days). Fractured L frontal sinus,L L3 transverse process, displaced angulated mildly comminuted L femoral neck fx. AKI with rhabdo. 3/31 pt with hypoxia requiring intubation, extubated 4/1. Pt s/p left anterior hip hemiarthroplasty 4/1. Re-intubated 4/4 for hypoxemia and transfered to ICU, extubated 10/17/20. 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 Able to work on standing and therapeutic exercise for LLE ROM and strength. Patient's BP as below. Pt denied dizziness.   Supine 126/55 (77)  HR 81  Sitting 137/49 (72)  HR 89  Sitting after standing x 2 minutes 151/63 (89)  HR 91      Follow Up Recommendations  SNF;Supervision/Assistance - 24 hour     Equipment Recommendations  Rolling walker with 5" wheels;3in1 (PT)    Recommendations for Other Services       Precautions / Restrictions Precautions Precautions: Fall Precaution Comments: Coretrak, rectal tube, O2 Restrictions LLE Weight Bearing: Weight bearing as tolerated Other Position/Activity Restrictions: s/p L hemi arthroplasty, anterior approach, no precautions    Mobility  Bed Mobility Overal bed mobility: Needs Assistance Bed Mobility: Supine to Sit;Sit to Supine     Supine to sit: HOB elevated;Mod assist Sit to supine: +2 for safety/equipment;Min assist   General bed mobility comments: no bed rails used; HOB 35 (due to cortrak)    Transfers Overall transfer level: Needs assistance Equipment used: Rolling walker (2 wheeled) Transfers: Sit to/from Stand Sit to Stand: +2 safety/equipment;Min assist         General  transfer comment: Pt requiring cues for safe hand placement and sequencing.  Performed sit to stand from bed x 2.  Ambulation/Gait             General Gait Details: side step only to [facility] due to lines/monitor/decr cognition. Recommend chair follow if walk away from bed. Pt taking steps forward when told to step sideways (repeatedly); difficulty stepping backwards.   Stairs             Wheelchair Mobility    Modified Rankin (Stroke Patients Only)       Balance Overall balance assessment: Needs assistance Sitting-balance support: Feet supported;Bilateral upper extremity supported Sitting balance-Leahy Scale: Poor Sitting balance - Comments: requiring UE support and min A at EOB   Standing balance support: Bilateral upper extremity supported Standing balance-Leahy Scale: Poor Standing balance comment: Requiring min-mod A of 2 with bil HHA to stand; goes to sit with little warning                            Cognition Arousal/Alertness: Lethargic;Suspect due to medications Behavior During Therapy: [facility] for tasks assessed/performed Overall Cognitive Status: Impaired/Different from baseline Area of Impairment: Orientation;Memory;Following commands;Safety/judgement;Problem solving                 Orientation Level: Disoriented to;Place;Time [facility] hospital (able to get [facility] with cues)) Current Attention Level: Sustained Memory: Decreased short-term memory Following Commands: Follows one step commands inconsistently Safety/Judgement: Decreased awareness of safety;Decreased awareness of deficits   Problem Solving: Slow processing;Decreased initiation;Difficulty sequencing;Requires tactile cues;Requires verbal cues General Comments: requires multimodal  cues to follow commands consistently and for safety      Exercises General Exercises - Lower Extremity Ankle Circles/Pumps: AROM;Both;10 reps Short Arc Quad: AROM;Left;10 reps Heel Slides: AROM;Left;10  reps;AAROM Hip ABduction/ADduction: AAROM;Left;10 reps Other Exercises Other Exercises: attempted bridging, however pt unable to comprehend exercise and repeatedly lifted his feet off the bed instead of his hips    General Comments        Pertinent Vitals/Pain Pain Assessment: Faces Faces Pain Scale: No hurt    Home Living                      Prior Function            PT Goals (current goals can now be found in the care plan section) Acute Rehab PT Goals Patient Stated Goal: Did not specifically state, but seemed pleased to be able to get up Time For Goal Achievement: 10/27/20 Potential to Achieve Goals: Fair Progress towards PT goals: Progressing toward goals    Frequency    Min 3X/week      PT Plan Current plan remains appropriate    Co-evaluation              AM-PAC PT "6 Clicks" Mobility   Outcome Measure  Help needed turning from your back to your side while in a flat bed without using bedrails?: A Little Help needed moving from lying on your back to sitting on the side of a flat bed without using bedrails?: A Lot Help needed moving to and from a bed to a chair (including a wheelchair)?: A Lot Help needed standing up from a chair using your arms (e.g., wheelchair or bedside chair)?: A Lot Help needed to walk in hospital room?: Total Help needed climbing 3-5 steps with a railing? : Total 6 Click Score: 11    End of Session Equipment Utilized During Treatment: Gait belt Activity Tolerance: Patient tolerated treatment well Patient left: in bed;with call bell/phone within reach;with restraints reapplied;with bed alarm set (bil wrist restraints (therefore returned to bed)) Nurse Communication: Mobility status PT Visit Diagnosis: Other abnormalities of gait and mobility (R26.89);Difficulty in walking, not elsewhere classified (R26.2);Muscle weakness (generalized) (M62.81)     Time: 0321-2248 PT Time Calculation (min) (ACUTE ONLY): 37  min  Charges:  $Therapeutic Exercise: 8-22 mins $Therapeutic Activity: 8-22 mins                      Arby Barrette, PT Pager [phone]    Rexanne Mano 10/23/2020, 12:27 PM

## 2020-10-23 NOTE — Progress Notes (Signed)
Pharmacy Antibiotic Note  [name] is a 70 y.o. male admitted on 10/10/2020 with 3/30 after fall and found by brother with unknown down time (1-3 days). Fractured L frontal sinus,L L3 transverse process, displaced angulated mildly comminuted L femoral neck fx. AKI with rhabdo. 3/31 pt with hypoxia requiring intubation, extubated 4/1. Pt s/p left anterior hip hemiarthroplasty 4/1. Re-intubated 4/4 for hypoxemia and transfered to ICU, extubated 10/17/20.  Pharmacy has been consulted on 10/23/20 for Unasyn dosing for aspiration pneumonia  Plan: Unasyn 3 gm IV every 6 hours Monitor clinical status, renal function, culture results and LOT daily.    Height: _0  (175.3 cm) Weight: 62.6 kg (138 lb 0.1 oz) IBW/kg (Calculated) : 70.7  Temp (24hrs), Avg:98.2 F (36.8 C), Min:97.9 F (36.6 C), Max:98.5 F (36.9 C)  Recent Labs  Lab 10/17/20 0855 10/17/20 0857 10/18/20 0447 10/19/20 0544 10/19/20 1145 10/20/20 0206 10/23/20 0435  WBC  --  7.0 6.6  --  6.2 6.8 11.1*  CREATININE 0.71  --  0.75 0.68  --  0.66  --     Estimated Creatinine Clearance: 77.2 mL/min (by C-G formula based on SCr of 0.66 mg/dL).    Allergies  Allergen Reactions  . Codeine Nausea Only  . Niaspan [Niacin Er]     Antimicrobials this admission: Vanc 3/30 >> 4/1, 4/4 x1 Cefepime 3/30 >> 4/5 Unasyn 4/12>>    Microbiology results: 4/4 trach asp rare budding yeast : few candida krusei 4/4 MRSA PCR - negative 3/31 MRSA PCR - negative 3/31 BCx - negative  3/31 UCx - no growth 3/31 BAL - normal flora   Thank you for allowing pharmacy to be a part of this patient's care.  Nicole Cella, RPh Clinical Pharmacist [phone] Please check AMION for all [place] phone numbers After 10:00 PM, call [place] [phone]  10/23/2020 1:20 PM

## 2020-10-23 NOTE — Progress Notes (Signed)
Daily Progress Note   Patient Name: [name]       Date: 10/23/2020 DOB: [birth year]  Age: 70 y.o. MRN#: [id number] Attending Physician: Lorella Nimrod, MD Primary Care Physician: [name], MD Admit Date: 10/10/2020  Reason for Consultation/Follow-up: Establishing goals of care  Subjective: [name] is awake today. He is oriented to person and place and year. He is able to tell me he fell, however, he then becomes very tangential in his speech discussing subjects unrelated to our conversation.  His brother was not available for our scheduled conference call.   Review of Systems  Unable to perform ROS: Mental acuity    Length of Stay: 13  Current Medications: Scheduled Meds:  . arformoterol  15 mcg Nebulization BID  . aspirin  81 mg Per Tube Daily  . chlorhexidine gluconate (MEDLINE KIT)  15 mL Mouth Rinse BID  . Chlorhexidine Gluconate Cloth  6 each Topical Daily  . doxazosin  2 mg Per Tube Daily  . famotidine  20 mg Per Tube BID  . feeding supplement (PROSource TF)  45 mL Per Tube BID  . free water  150 mL Per Tube Q4H  . gabapentin  100 mg Per Tube Q8H  . heparin injection (subcutaneous)  5,000 Units Subcutaneous Q8H  . hydrALAZINE  10 mg Intravenous Once  . insulin aspart  0-9 Units Subcutaneous Q4H  . mouth rinse  15 mL Mouth Rinse q12n4p  . melatonin  5 mg Oral QHS  . mirtazapine  30 mg Per Tube QHS  . nicotine  14 mg Transdermal Daily  . revefenacin  175 mcg Nebulization Daily  . sodium chloride flush  10-40 mL Intracatheter Q12H    Continuous Infusions: . sodium chloride 10 mL/hr at 10/19/20 1500  . ampicillin-sulbactam (UNASYN) IV 3 g (10/23/20 1307)  . feeding supplement (JEVITY 1.5 CAL/FIBER) 1,000 mL (10/22/20 0934)    PRN Meds: sodium  chloride, acetaminophen **OR** acetaminophen, albuterol, ALPRAZolam, ondansetron (ZOFRAN) IV, Resource ThickenUp Clear, sodium chloride flush  Physical Exam Vitals and nursing note reviewed.  Constitutional:      Comments: Frail, cachetic  Pulmonary:     Effort: Pulmonary effort is normal.     Comments: Wet cough Skin:    Coloration: Skin is pale.     Findings: Bruising present.  Vital Signs: BP (!) 144/55 (BP Location: Left Arm)   Pulse 92   Temp 98.3 F (36.8 C) (Oral)   Resp 16   Ht 5' 9"  (1.753 m)   Wt 62.6 kg   SpO2 98%   BMI 20.38 kg/m  SpO2: SpO2: 98 % O2 Device: O2 Device: Room Air O2 Flow Rate: O2 Flow Rate (L/min): 3 L/min  Intake/output summary:   Intake/Output Summary (Last 24 hours) at 10/23/2020 1415 Last data filed at 10/23/2020 8182 Gross per 24 hour  Intake 700 ml  Output 200 ml  Net 500 ml   LBM: Last BM Date: 10/21/20 Baseline Weight: Weight: 66.7 kg Most recent weight: Weight: 62.6 kg       Palliative Assessment/Data: PPS: 30%      Patient Active Problem List   Diagnosis Date Noted  . Status post total replacement of left hip   . Non-traumatic rhabdomyolysis   . Fall   . Left-sided weakness   . Acute respiratory failure with hypoxia ([place])   . Protein-calorie malnutrition, severe 10/16/2020  . Closed fracture of left hip ([place])   . Pressure injury of skin 10/11/2020  . Sepsis without septic shock ([place])   . Left displaced femoral neck fracture ([place])   . Delirium   . Closed fracture of frontal sinus ([place])   . Closed fracture of transverse process of lumbar vertebra ([place])   . Goals of care, counseling/discussion 12/19/2019  . Iron deficiency anemia 09/12/2019  . Small cell lung cancer in adult [facility]) 02/05/2017  . Lymphadenopathy of head and neck 01/22/2017  . Osteopenia 03/30/2015  . Clubbing of fingers 04/14/2014  . Hyperglycemia 10/20/2013  . Smoker   . Closed dislocation of acromioclavicular joint 04/11/2010  . BREAST  MASS, LEFT 01/20/2008  . ONYCHOMYCOSIS, TOENAILS 10/20/2007  . MUSCLE CRAMPS 10/20/2007  . Hypercholesterolemia 05/28/2007  . CATARACT NOS 08/28/2006  . DISTURBANCE, VISUAL NOS 08/19/2006  . COPD (chronic obstructive pulmonary disease) with emphysema ([place]) 08/19/2006  . WITHDRAWAL, DRUG 06/24/2006  . ANXIETY 06/24/2006  . DEPRESSION 06/24/2006  . MYOCARDIAL INFARCTION, HX OF 06/24/2006  . Coronary atherosclerosis 06/24/2006  . CARDIAC ARRHYTHMIA 06/24/2006  . CONSTIPATION 06/24/2006  . OSTEOARTHRITIS 06/24/2006  . LOW BACK PAIN 06/24/2006  . 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 He remains confused. Has significant dysphagia and is currently NPO with coretrak in place. Palliative medicine consulted for goals of care for this very ill man with multiple comorbidities who is at high risk of decompensation and dying.  Assessment/Recommendations/Plan  Continue current scope- full scope, full code Have tried repeadtedly to reach David. Greer family for further [place] discussion without success- await return call from [name] with a new time for conference call  Goals of Care and Additional Recommendations: Limitations on Scope of Treatment: Full Scope Treatment  Code Status: Full code  Prognosis:  Unable to determine  Discharge Planning: To Be Determined  Care plan was discussed with patient.  Thank you for allowing the Palliative Medicine Team to assist in the care of this patient.   Total time: 26 mins Greater than 50%  of this time  was spent  counseling and coordinating care related to the above assessment and plan.  Mariana Kaufman, AGNP-C Palliative Medicine   Please contact Palliative Medicine Team phone at [phone] for questions and concerns.

## 2020-10-23 NOTE — Plan of Care (Signed)
  Problem: Clinical Measurements: Goal: Postoperative complications will be avoided or minimized Outcome: Progressing   Problem: Pain Management: Goal: Pain level will decrease Outcome: Progressing   Problem: Clinical Measurements: Goal: Ability to maintain clinical measurements within normal limits will improve Outcome: Progressing   Problem: Coping: Goal: Level of anxiety will decrease Outcome: Progressing

## 2020-10-23 NOTE — Progress Notes (Signed)
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  Patient was found by brother on 3/30 after he had been unable to reach patient for unknown period of time (1 to 3 days suspected).  Patient fracture left frontal sinus, L3 transverse process, displaced angulated mildly comminuted left femoral neck fracture.  AKI with rhabdomyolysis.  On 3/31 patient develop worsening hypoxia and respiratory distress, intubated.  Subsequently extubated 4/2 and transferred out of the ICU on 4/3.  The evening of 4/3--4/4 he developed increased work of breathing and increased oxygen needs requiring intubation.  This was thought to be related to an aspiration event.  Significant events: -3/30 admitted to [facility] for encephalopathy. Facial fx, L3 TP fx, Hip fx.  -3/31 non op from [place] standpoint. Ortho planned to OR 3/31 however acute hypoxic respiratory failure, transferred to ICU and emergently intubated.  -3/31 ETT >>4/1 -3/31 bronchoscopy with BAL left lower lobe -4/1 LT  Hip Hemiarthroplasty (Anterior Approach) -4/4 ETT>>4/6 Currently stable on room air.  PT is recommending SNF. Palliative care was also consulted-patient will remain full code with full scope of medical care.  -4/12.. Patient with worsening cough and leukocytosis, started on Unasyn for concern of aspiration.  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 [facility])   Pressure injury of skin   Closed fracture of left hip (HCC)   Protein-calorie malnutrition, severe   Status post total replacement of left hip   Non-traumatic rhabdomyolysis   Fall   Left-sided weakness   Acute respiratory failure with hypoxia (HCC)   1-Acute  Hypoxic Respiratory Failure, Aspiration Pneumonia, COPD Emphysema Mucus Plug 4/05. History of a small cell lung cancer status post chemotherapy with lurbinectedin last cycle 09/12/20. -Extubated 4\6 -Completed 7 days of cefepime 4/05. -Brovana/Yupelri nebulizer treatments and PRN albuterol.  -received Solumedrol on 4/05 during mucus plug event, prednisone for COPD.  -Patient  To be discharge on Incruse and Dulera. On nebulizer while inpatient.  Patient became hypoxic again last night requiring 4 to 6 L of oxygen. -Chest PT to see if he can bring up his secretions. -Start him on Unasyn due to worsening cough and leukocytosis. -Incentive spirometry and flutter well.  2-Nausea vomiting: Continue to feel nauseated during attempt to give p.o. In setting of constipation, KUB nonobstructive bowel pattern  Nutrition;  On tube feeding.  Through core track. Swallow evaluation was attempted again today but patient was keeping pured diet and his mouth, current recommendations remained to maintain n.p.o. and continue feeding through tube.  If patient needs long-term tube feeding-we will order PEG tube with IR. -Swallow team placed him on dysphagia 2 diet with thick liquid after getting a barium swallow, please see the full report-remain high risk for aspiration.  GI, Diarrhea; resolved after stopping laxative and change in his tube feed formula.  Acute metabolic encephalopathy: Delirium.  Appears to be at baseline now. Likely in the setting of acute respiratory failure, due to aspiration pneumonia and ICU delirium. Continue Gabapentin,dose was reduced to 100 mg 3 times daily from 300 mg. Patient at time started talking nonsense stories and at times become more reasonable, fluctuating mentation.  Metastatic small cell carcinoma -Lung, liver -Follows with Dr. Delton Coombes.  -  CT head; No acute intracranial pathology   Left femoral Neck Fx S/P Hemiarthroplasty:  Ortho following.  Patient remove wound  vac last night.  Nurse will page ortho and inform them. /  PT following-recommending SNF placement.  Left Facial Fx, L 3 TP Fx:  Neurosurgery  evaluated , no surgical intervention needed.  Neurosurgery recommend ENT consult. Spoke with ENT on called for [place] recommend Conservative management, Fracture is non displaced, does not need anything acute.   Rhabdomyolysis.  Resolved with IV fluids.   Thrombocytopenia, anemia; Platelet improving.  Hb stable and seems improving slowly.  S/p PRBC on 10/17/2020  Tabbacco abuse. Needs counseling.   Sepsis was ruled out.   Pressure Injury 10/11/20 Sacrum Medial Deep Tissue Pressure Injury - Purple or maroon localized area of discolored intact skin or blood-filled blister due to damage of underlying soft tissue from pressure and/or shear. (Active)  10/11/20 0220  Location: Sacrum  Location Orientation: Medial  Staging: Deep Tissue Pressure Injury - Purple or maroon localized area of discolored intact skin or blood-filled blister due to damage of underlying soft tissue from pressure and/or shear.  Wound Description (Comments):   Present on Admission: Yes     Pressure Injury 10/15/20 Heel Right Deep Tissue Pressure Injury - Purple or maroon localized area of discolored intact skin or blood-filled blister due to damage of underlying soft tissue from pressure and/or shear. (Active)  10/15/20 0323  Location: Heel  Location Orientation: Right  Staging: Deep Tissue Pressure Injury - Purple or maroon localized area of discolored intact skin or blood-filled blister due to damage of underlying soft tissue from pressure and/or shear.  Wound Description (Comments):   Present on Admission: No    Nutrition Problem: Severe Malnutrition Etiology: chronic illness (small cell carcinoma of the lung)  Signs/Symptoms: severe muscle depletion,severe fat depletion  Interventions: Tube feeding,Prostat  Estimated body mass index is 20.38 kg/m as calculated from  the following:   Height as of this encounter: _0  (1.753 m).   Weight as of this encounter: 62.6 kg.   DVT prophylaxis: Heparin  Code Status: Full Code Family Communication: Unable to reach brother on phone today. Disposition Plan:  Status is: Inpatient  Remains inpatient appropriate because:IV treatments appropriate due to intensity of illness or inability to take PO   Dispo:  Patient From: Home  Planned Disposition: [place]  Medically stable for discharge: No   Patient is very sick with multiple commodities and advice cancer, full code at this time.  Very high risk for deterioration and death-palliative care was consulted and they are trying to reach family but according to patient he wants everything to make him feel better.  Consultants:   Neurosurgery  Ortho  CCM  Palliative care  Procedures:     Antimicrobials:  Unasyn  Subjective: Patient was unable to sleep last night due to worsening cough.  Having increased sputum production.  No fever or chills.  Feeling more weak.  Appears little more confused as he was telling quite nonsense stories, at time do answer appropriately.  Objective: Vitals:   10/23/20 0500 10/23/20 0806 10/23/20 1228 10/23/20 1537  BP:  (!) 135/50 (!) 144/55 103/73  Pulse:  78 92 89  Resp:  _1 Temp:  98 F (36.7 C) 98.3 F (36.8 C)   TempSrc:  Oral Oral   SpO2:  93% 98% 95%  Weight: 62.6 kg     Height:        Intake/Output Summary (  Last 24 hours) at 10/23/2020 1620 Last data filed at 10/23/2020 1500 Gross per 24 hour  Intake 1200 ml  Output 200 ml  Net 1000 ml   Filed Weights   10/20/20 0346 10/21/20 0358 10/23/20 0500  Weight: 65.8 kg 63.8 kg 62.6 kg    Examination:  General.  Frail, emaciated gentleman, in no acute distress. Pulmonary.  Few scattered rhonchi, normal respiratory effort. CV.  Regular rate and rhythm, no JVD, rub or murmur. Abdomen.  Soft, nontender, nondistended, BS  positive. CNS.  Alert and oriented .  No focal neurologic deficit. Extremities.  No edema, no cyanosis, pulses intact and symmetrical. Psychiatry.  Judgment and insight appears impaired.  Data Reviewed: I have personally reviewed following labs and imaging studies  CBC: Recent Labs  Lab 10/17/20 0857 10/18/20 0447 10/19/20 1145 10/20/20 0206 10/23/20 0435  WBC 7.0 6.6 6.2 6.8 11.1*  HGB 6.5* 7.4* 9.1* 9.5* 9.6*  HCT 19.2* 21.8* 27.6* 28.1* 29.0*  MCV 93.2 93.2 93.6 90.9 94.2  PLT 84* 104* 146* 155 671   Basic Metabolic Panel: Recent Labs  Lab 10/16/20 1704 10/17/20 0430 10/17/20 0855 10/18/20 0447 10/18/20 0733 10/19/20 0544 10/20/20 0206  NA  --   --  138 139  --  137 134*  K  --   --  3.9 3.3*  --  3.7 3.7  CL  --   --  106 107  --  104 102  CO2  --   --  25 28  --  28 25  GLUCOSE  --   --  138* 114*  --  124* 120*  BUN  --   --  25* 30*  --  22 20  CREATININE  --   --  0.71 0.75  --  0.68 0.66  CALCIUM  --   --  8.1* 8.3*  --  8.3* 8.6*  MG 2.3 2.3  --  2.3  --   --   --   PHOS 4.0 2.5  --   --  2.3*  --   --    GFR: Estimated Creatinine Clearance: 77.2 mL/min (by C-G formula based on SCr of 0.66 mg/dL). Liver Function Tests: No results for input(s): AST, ALT, ALKPHOS, BILITOT, PROT, ALBUMIN in the last 168 hours. No results for input(s): LIPASE, AMYLASE in the last 168 hours. No results for input(s): AMMONIA in the last 168 hours. Coagulation Profile: No results for input(s): INR, PROTIME in the last 168 hours. Cardiac Enzymes: No results for input(s): CKTOTAL, CKMB, CKMBINDEX, TROPONINI in the last 168 hours. BNP (last 3 results) No results for input(s): PROBNP in the last 8760 hours. HbA1C: No results for input(s): HGBA1C in the last 72 hours. CBG: Recent Labs  Lab 10/23/20 0051 10/23/20 0340 10/23/20 0824 10/23/20 1226 10/23/20 1535  GLUCAP 119* 110* 113* 139* 96   Lipid Profile: No results for input(s): CHOL, HDL, LDLCALC, TRIG, CHOLHDL,  LDLDIRECT in the last 72 hours. Thyroid Function Tests: No results for input(s): TSH, T4TOTAL, FREET4, T3FREE, THYROIDAB in the last 72 hours. Anemia Panel: No results for input(s): VITAMINB12, FOLATE, FERRITIN, TIBC, IRON, RETICCTPCT in the last 72 hours. Sepsis Labs: Recent Labs  Lab 10/23/20 1245  PROCALCITON <0.10    Recent Results (from the past 240 hour(s))  Culture, Respiratory w Gram Stain     Status: None   Collection Time: 10/15/20  4:26 AM   Specimen: Tracheal Aspirate; Respiratory  Result Value Ref Range Status   Specimen Description TRACHEAL  ASPIRATE  Final   Special Requests NONE  Final   Gram Stain   Final    NO WBC SEEN RARE BUDDING YEAST SEEN Performed at [facility], [place] [address], [place], [place] [address]    Culture FEW CANDIDA KRUSEI  Final   Report Status 10/17/2020 FINAL  Final  MRSA PCR Screening     Status: None   Collection Time: 10/15/20  9:45 AM   Specimen: Nasopharyngeal  Result Value Ref Range Status   MRSA by PCR NEGATIVE NEGATIVE Final    Comment:        The GeneXpert MRSA Assay (FDA approved for NASAL specimens only), is one component of a comprehensive MRSA colonization surveillance program. It is not intended to diagnose MRSA infection nor to guide or monitor treatment for MRSA infections. Performed at [facility], [place] [address], [place], [place] [address]      Radiology Studies: DG CHEST PORT 1 VIEW  Result Date: 10/23/2020 CLINICAL DATA:  Cough. EXAM: PORTABLE CHEST 1 VIEW COMPARISON:  10/16/2020 FINDINGS: Stable normal sized heart. Airspace opacity is again demonstrated in the left lower lobe with mild improvement. The remainder of the lungs are clear and hyperexpanded with mild chronic prominence of the interstitial markings. Right jugular porta catheter tip at the superior cavoatrial junction. Feeding tube extending into the stomach. Unremarkable bones. IMPRESSION: 1. Mildly improved left lower lobe pneumonia. 2.  Mild changes of COPD. Electronically Signed   By: Claudie Revering M.D.   On: 10/23/2020 08:37   DG Swallowing Func-Speech Pathology  Result Date: 10/22/2020 Objective Swallowing Evaluation: Type of Study: MBS-Modified Barium Swallow Study  Patient Details Name: BRENAN MODESTO MRN: [id number] Date of Birth: [birth year] Today's Date: 10/22/2020 Time: SLP Start Time (ACUTE ONLY): 1440 -SLP Stop Time (ACUTE ONLY): 1455 SLP Time Calculation (min) (ACUTE ONLY): 15 min Past Medical History: Past Medical History: Diagnosis Date . Anxiety  . CAD (coronary artery disease)  . Cancer ([place])   stage 4 small cell lung cancer  . COPD (chronic obstructive pulmonary disease) ([place])  . Depression  . Dyspnea   increased exertion . Feeling of chest tightness  . Heart palpitations  . History of chemotherapy  . Myocardial infarction ([place])  . Osteopenia  . Panic attacks  . Smoker  Past Surgical History: Past Surgical History: Procedure Laterality Date . BACK SURGERY  12/24/2000  L5,S1 . CORONARY STENT PLACEMENT  2005  RCA & CX . HERNIA REPAIR Right 1980's . INGUINAL HERNIA REPAIR  12/1978  right side . IR FLUORO GUIDE PORT INSERTION RIGHT  04/02/2017 . IR US GUIDE BX ASP/DRAIN  02/03/2017 . IR US GUIDE VASC ACCESS RIGHT  04/02/2017 . NM MYOCAR PERF WALL MOTION  09/07/2009  No ischemia; EF 51% . SHOULDER SURGERY Left 08/2010 . SPINE SURGERY  2002  L5-S1 . TOTAL HIP ARTHROPLASTY Left 10/12/2020  Procedure: TOTALhemi  HIP ARTHROPLASTY ANTERIOR APPROACH;  Surgeon: [name], MD;  Location: [place];  Service: Orthopedics;  Laterality: Left; HPI: Pt is a 71 yo male admitted 3/30 with AMS after fall with unknown downtime (1-3 days suspected). Pt was found to have L hip fx, L L3 transverse process fx, L frontal sinus fx, and AKI with rhabdomyolysis. On 3/31 pt had worsening hypoxia thought to be related to an aspiration event, requiring ETT 3/31-4/2; another suspected aspiration event on requiring re-intubation 4/4-4/6. PMH includes: metastatic small cell  carcinoma of the lung currently receiving chemotherapy, CAD with prior PCI, anxiety,  depression, COPD, tobacco use. Pt was reporting intermittent difficulty swallowing solids in 2019 per cancer center notes.  Subjective: Alert, but pleasantly confused Assessment / Plan / Recommendation CHL IP CLINICAL IMPRESSIONS 10/22/2020 Clinical Impression Pt presents with oropharyngeal dysphagia characterized by reduced bolus cohesion, a pharyngeal delay, reduced lingual retraction, and reduced anterior laryngeal movement. He demonstrated premature spillage to the valleculae with inconsistent spillover to the pyriform sinuses, base of tongue residue, vallecular residue, pyriform sinus residue, penetration (PAS 3, 5) and inconsistent aspiration (PAS 7) of thin and nectar thick iquids. Aspiration consistently triggered a spontaneous cough which was inconsistently effective in expelling the aspirate. Penetration was improved and aspiration eliminated with reduced bolus sizes of nectar thick and thin liquids via cup; however, pt was unable to consistently reduce bolus size and eliminate use of a posterior head tilt despite cues and prompts. Pharyngeal residue was reduced with use of a liquid wash and pt was able to swallow a barium tablet with honey thick liquids via cup. A dysphagia 2 diet with honey thick liquids will be initiated at this time. However, there is potential for advancement of liquids once pt is able to demonstrate use of swallowing precautions. SLP will follow for treatment. SLP Visit Diagnosis Dysphagia, oropharyngeal phase (R13.12) Attention and concentration deficit following -- Frontal lobe and executive function deficit following -- Impact on safety and function Mild aspiration risk;Moderate aspiration risk   CHL IP TREATMENT RECOMMENDATION 10/22/2020 Treatment Recommendations Therapy as outlined in treatment plan below;F/U MBS in --- days (Comment)   Prognosis 10/22/2020 Prognosis for Safe Diet Advancement Good  Barriers to Reach Goals Cognitive deficits Barriers/Prognosis Comment -- CHL IP DIET RECOMMENDATION 10/22/2020 SLP Diet Recommendations Dysphagia 2 (Fine chop) solids;Honey thick liquids Liquid Administration via Cup;Straw Medication Administration Whole meds with puree Compensations Slow rate;Small sips/bites;Minimize environmental distractions;Follow solids with liquid Postural Changes Remain semi-upright after after feeds/meals (Comment);Seated upright at 90 degrees   CHL IP OTHER RECOMMENDATIONS 10/22/2020 Recommended Consults -- Oral Care Recommendations Oral care BID Other Recommendations Order thickener from pharmacy   CHL IP FOLLOW UP RECOMMENDATIONS 10/22/2020 Follow up Recommendations Skilled Nursing facility   [facility] IP FREQUENCY AND DURATION 10/22/2020 Speech Therapy Frequency (ACUTE ONLY) min 2x/week Treatment Duration 2 weeks      CHL IP ORAL PHASE 10/22/2020 Oral Phase Impaired Oral - Pudding Teaspoon -- Oral - Pudding Cup -- Oral - Honey Teaspoon -- Oral - Honey Cup -- Oral - Nectar Teaspoon -- Oral - Nectar Cup Decreased bolus cohesion;Premature spillage Oral - Nectar Straw Decreased bolus cohesion;Premature spillage Oral - Thin Teaspoon -- Oral - Thin Cup Decreased bolus cohesion;Premature spillage Oral - Thin Straw Decreased bolus cohesion;Premature spillage Oral - Puree WFL Oral - Mech Soft -- Oral - Regular Impaired mastication Oral - Multi-Consistency -- Oral - Pill WFL Oral Phase - Comment --  CHL IP PHARYNGEAL PHASE 10/22/2020 Pharyngeal Phase Impaired Pharyngeal- Pudding Teaspoon -- Pharyngeal -- Pharyngeal- Pudding Cup -- Pharyngeal -- Pharyngeal- Honey Teaspoon -- Pharyngeal -- Pharyngeal- Honey Cup -- Pharyngeal -- Pharyngeal- Nectar Teaspoon -- Pharyngeal -- Pharyngeal- Nectar Cup Trace aspiration;Pharyngeal residue - valleculae;Pharyngeal residue - pyriform;Reduced tongue base retraction;Penetration/Aspiration during swallow;Penetration/Apiration after swallow;Reduced anterior laryngeal  mobility;Delayed swallow initiation-vallecula;Delayed swallow initiation-pyriform sinuses Pharyngeal Material enters airway, remains ABOVE vocal cords and not ejected out;Material enters airway, passes BELOW cords and not ejected out despite cough attempt by patient Pharyngeal- Nectar Straw Trace aspiration;Pharyngeal residue - valleculae;Pharyngeal residue - pyriform;Reduced tongue base retraction;Penetration/Aspiration during swallow;Penetration/Apiration after swallow;Reduced anterior laryngeal mobility;Delayed swallow initiation-vallecula;Delayed swallow  initiation-pyriform sinuses Pharyngeal Material enters airway, CONTACTS cords and not ejected out;Material enters airway, passes BELOW cords and not ejected out despite cough attempt by patient Pharyngeal- Thin Teaspoon -- Pharyngeal -- Pharyngeal- Thin Cup Trace aspiration;Pharyngeal residue - valleculae;Pharyngeal residue - pyriform;Reduced tongue base retraction;Penetration/Aspiration during swallow;Penetration/Apiration after swallow;Reduced anterior laryngeal mobility;Delayed swallow initiation-vallecula;Delayed swallow initiation-pyriform sinuses Pharyngeal Material enters airway, passes BELOW cords and not ejected out despite cough attempt by patient;Material enters airway, remains ABOVE vocal cords and not ejected out Pharyngeal- Thin Straw Trace aspiration;Pharyngeal residue - valleculae;Pharyngeal residue - pyriform;Reduced tongue base retraction;Penetration/Aspiration during swallow;Penetration/Apiration after swallow;Reduced anterior laryngeal mobility;Delayed swallow initiation-vallecula;Delayed swallow initiation-pyriform sinuses Pharyngeal Material enters airway, CONTACTS cords and not ejected out;Material enters airway, passes BELOW cords and not ejected out despite cough attempt by patient Pharyngeal- Puree Pharyngeal residue - valleculae;Pharyngeal residue - pyriform;Reduced tongue base retraction;Reduced anterior laryngeal mobility;Delayed  swallow initiation-vallecula;Delayed swallow initiation-pyriform sinuses Pharyngeal -- Pharyngeal- Mechanical Soft -- Pharyngeal -- Pharyngeal- Regular -- Pharyngeal -- Pharyngeal- Multi-consistency -- Pharyngeal -- Pharyngeal- Pill Pharyngeal residue - valleculae;Pharyngeal residue - pyriform;Reduced tongue base retraction;Reduced anterior laryngeal mobility;Delayed swallow initiation-vallecula;Delayed swallow initiation-pyriform sinuses Pharyngeal -- Pharyngeal Comment --  CHL IP CERVICAL ESOPHAGEAL PHASE 10/22/2020 Cervical Esophageal Phase WFL Pudding Teaspoon -- Pudding Cup -- Honey Teaspoon -- Honey Cup -- Nectar Teaspoon -- Nectar Cup -- Nectar Straw -- Thin Teaspoon -- Thin Cup -- Thin Straw -- Puree -- Mechanical Soft -- Regular -- Multi-consistency -- Pill -- Cervical Esophageal Comment -- Shanika I. Hardin Negus, [place], [place] Office number [phone] Pager [phone] Horton Marshall 10/22/2020, 4:50 PM               Scheduled Meds: . arformoterol  15 mcg Nebulization BID  . aspirin  81 mg Per Tube Daily  . chlorhexidine gluconate (MEDLINE KIT)  15 mL Mouth Rinse BID  . Chlorhexidine Gluconate Cloth  6 each Topical Daily  . doxazosin  2 mg Per Tube Daily  . famotidine  20 mg Per Tube BID  . feeding supplement (PROSource TF)  45 mL Per Tube BID  . free water  150 mL Per Tube Q4H  . gabapentin  100 mg Per Tube Q8H  . heparin injection (subcutaneous)  5,000 Units Subcutaneous Q8H  . hydrALAZINE  10 mg Intravenous Once  . insulin aspart  0-9 Units Subcutaneous Q4H  . mouth rinse  15 mL Mouth Rinse q12n4p  . melatonin  5 mg Oral QHS  . mirtazapine  30 mg Per Tube QHS  . nicotine  14 mg Transdermal Daily  . revefenacin  175 mcg Nebulization Daily  . sodium chloride flush  10-40 mL Intracatheter Q12H   Continuous Infusions: . sodium chloride 10 mL/hr at 10/19/20 1500  . ampicillin-sulbactam (UNASYN) IV 3 g (10/23/20 1307)  . feeding supplement (JEVITY 1.5  CAL/FIBER) 1,000 mL (10/22/20 0934)     LOS: 13 days    Time spent: 30 minutes.    Lorella Nimrod, MD [facility]   If 7PM-7AM, please contact night-coverage [email or web address]  10/23/2020, 4:20 PM

## 2020-10-23 NOTE — Progress Notes (Signed)
Tele called and said patient had a 5 beat run of V-tach. Went to assess patient, PT and OT working with patient. Patient denies any signs and symptoms. MD notified.

## 2020-10-24 DIAGNOSIS — W19XXXA Unspecified fall, initial encounter: Secondary | ICD-10-CM | POA: Diagnosis not present

## 2020-10-24 DIAGNOSIS — S72002A Fracture of unspecified part of neck of left femur, initial encounter for closed fracture: Secondary | ICD-10-CM | POA: Diagnosis not present

## 2020-10-24 DIAGNOSIS — J9601 Acute respiratory failure with hypoxia: Secondary | ICD-10-CM | POA: Diagnosis not present

## 2020-10-24 DIAGNOSIS — M6282 Rhabdomyolysis: Secondary | ICD-10-CM | POA: Diagnosis not present

## 2020-10-24 DIAGNOSIS — C3490 Malignant neoplasm of unspecified part of unspecified bronchus or lung: Secondary | ICD-10-CM | POA: Diagnosis not present

## 2020-10-24 DIAGNOSIS — Z96642 Presence of left artificial hip joint: Secondary | ICD-10-CM | POA: Diagnosis not present

## 2020-10-24 DIAGNOSIS — R1312 Dysphagia, oropharyngeal phase: Secondary | ICD-10-CM | POA: Diagnosis present

## 2020-10-24 LAB — CBC WITH DIFFERENTIAL/PLATELET
Abs Immature Granulocytes: 0.13 10*3/uL — ABNORMAL HIGH (ref 0.00–0.07)
Basophils Absolute: 0.1 10*3/uL (ref 0.0–0.1)
Basophils Relative: 1 %
Eosinophils Absolute: 0.2 10*3/uL (ref 0.0–0.5)
Eosinophils Relative: 3 %
HCT: 29.9 % — ABNORMAL LOW (ref 39.0–52.0)
Hemoglobin: 9.8 g/dL — ABNORMAL LOW (ref 13.0–17.0)
Immature Granulocytes: 2 %
Lymphocytes Relative: 12 %
Lymphs Abs: 0.9 10*3/uL (ref 0.7–4.0)
MCH: 31.3 pg (ref 26.0–34.0)
MCHC: 32.8 g/dL (ref 30.0–36.0)
MCV: 95.5 fL (ref 80.0–100.0)
Monocytes Absolute: 0.6 10*3/uL (ref 0.1–1.0)
Monocytes Relative: 9 %
Neutro Abs: 5.2 10*3/uL (ref 1.7–7.7)
Neutrophils Relative %: 73 %
Platelets: 295 10*3/uL (ref 150–400)
RBC: 3.13 MIL/uL — ABNORMAL LOW (ref 4.22–5.81)
RDW: 18.8 % — ABNORMAL HIGH (ref 11.5–15.5)
WBC: 7.1 10*3/uL (ref 4.0–10.5)
nRBC: 0 % (ref 0.0–0.2)

## 2020-10-24 LAB — GLUCOSE, CAPILLARY
Glucose-Capillary: 104 mg/dL — ABNORMAL HIGH (ref 70–99)
Glucose-Capillary: 131 mg/dL — ABNORMAL HIGH (ref 70–99)
Glucose-Capillary: 105 mg/dL — ABNORMAL HIGH (ref 70–99)
Glucose-Capillary: 146 mg/dL — ABNORMAL HIGH (ref 70–99)
Glucose-Capillary: 122 mg/dL — ABNORMAL HIGH (ref 70–99)
Glucose-Capillary: 112 mg/dL — ABNORMAL HIGH (ref 70–99)
Glucose-Capillary: 115 mg/dL — ABNORMAL HIGH (ref 70–99)

## 2020-10-24 LAB — COMPREHENSIVE METABOLIC PANEL
ALT: 44 U/L (ref 0–44)
AST: 23 U/L (ref 15–41)
Albumin: 2.7 g/dL — ABNORMAL LOW (ref 3.5–5.0)
Alkaline Phosphatase: 72 U/L (ref 38–126)
Anion gap: 5 (ref 5–15)
BUN: 23 mg/dL (ref 8–23)
CO2: 25 mmol/L (ref 22–32)
Calcium: 8.5 mg/dL — ABNORMAL LOW (ref 8.9–10.3)
Chloride: 104 mmol/L (ref 98–111)
Creatinine, Ser: 0.86 mg/dL (ref 0.61–1.24)
GFR, Estimated: >60 mL/min (ref >60)
Glucose, Bld: 99 mg/dL (ref 70–99)
Potassium: 4.3 mmol/L (ref 3.5–5.1)
Sodium: 134 mmol/L — ABNORMAL LOW (ref 135–145)
Total Bilirubin: 0.8 mg/dL (ref 0.3–1.2)
Total Protein: 5.8 g/dL — ABNORMAL LOW (ref 6.5–8.1)

## 2020-10-24 LAB — PHOSPHORUS: Phosphorus: 4.2 mg/dL (ref 2.5–4.6)

## 2020-10-24 LAB — MAGNESIUM: Magnesium: 2.2 mg/dL (ref 1.7–2.4)

## 2020-10-24 NOTE — Progress Notes (Addendum)
Occupational Therapy Treatment Patient Details Name: [name] MRN: [id number] DOB: Feb 15, 1951 Today's Date: 10/24/2020    History of present illness Pt is 70 yo admitted 3/30 after fall and found by brother with unknown down time (1-3 days). Fractured L frontal sinus,L L3 transverse process, displaced angulated mildly comminuted L femoral neck fx. AKI with rhabdo. 3/31 pt with hypoxia requiring intubation, extubated 4/1. Pt s/p left anterior hip hemiarthroplasty 4/1. Re-intubated 4/4 for hypoxemia and transfered to ICU, extubated 10/17/20. 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 Pt limited by decreased strength, decreaesd cognition and decreased ability to safely follow commands. Pt maxA for sit to stand and maxA for stand pivot to recliner and taking steps to [facility]. Pt would benefit from continued OT skilled services. OT following acutely.  (pt left with chair alarm activated and pt calm, RN aware of need for xanax. VSS on RA; BP in 138/88 at EOB feeling dizzy, O2 on RA>90%; BP in recliner reclined as pt continued to feel dizzy 130/66, O2 >90%, HR 96 BPM.    Follow Up Recommendations  SNF;Supervision/Assistance - 24 hour    Equipment Recommendations  None recommended by OT    Recommendations for Other Services      Precautions / Restrictions Precautions Precautions: Fall Precaution Comments: Cortrak, rectal tube, O2 Restrictions Weight Bearing Restrictions: No LLE Weight Bearing: Weight bearing as tolerated Other Position/Activity Restrictions: s/p L hemi arthroplasty, anterior approach, no precautions       Mobility Bed Mobility Overal bed mobility: Needs Assistance Bed Mobility: Sit to Supine;Rolling;Sidelying to Sit Rolling: Mod assist Sidelying to sit: Mod assist;HOB elevated   Sit to supine: Min assist;HOB elevated   General bed mobility comments: bed rails for rolling to L,  assist with BLEs knees bending and coming off of bed; pt requiring assist for trunk elevation.    Transfers Overall transfer level: Needs assistance Equipment used: Rolling walker (2 wheeled) Transfers: Sit to/from Stand Sit to Stand: Mod assist         General transfer comment: Cues for hand placement and to wait for OTR to get ready finishing up.    Balance Overall balance assessment: Needs assistance Sitting-balance support: Feet supported;Bilateral upper extremity supported Sitting balance-Leahy Scale: Poor Sitting balance - Comments: minguardA at EOB once feet flat on floor   Standing balance support: Bilateral upper extremity supported Standing balance-Leahy Scale: Poor Standing balance comment: Pt maxA for sit to stand and maxA for stand pivot to recliner and taking steps to [facility].                           ADL either performed or assessed with clinical judgement   ADL Overall ADL's : Needs assistance/impaired     Grooming: Minimal assistance;Sitting;Cueing for safety                   Toilet Transfer: Maximal assistance;Cueing for safety;Cueing for sequencing;Stand-pivot;RW Toilet Transfer Details (indicate cue type and reason): simulated to recliner and back to bed as pt unsafe in recliner and often becomes anxious and rips all leads off wanting to "go home right now."         Functional mobility during ADLs: Maximal assistance;Cueing for safety;Cueing for sequencing;Rolling walker General ADL Comments: Pt limited by decreased strength, decreaesd cognition anc decreased ability to safely follow commands.     Vision  Vision Assessment?: No apparent visual deficits   Perception     Praxis      Cognition Arousal/Alertness: Lethargic;Suspect due to medications Behavior During Therapy: [facility] for tasks assessed/performed Overall Cognitive Status: Impaired/Different from baseline Area of Impairment: Orientation;Memory;Following  commands;Safety/judgement;Problem solving                 Orientation Level: Disoriented to;Place;Time Current Attention Level: Sustained Memory: Decreased short-term memory Following Commands: Follows one step commands inconsistently Safety/Judgement: Decreased awareness of safety;Decreased awareness of deficits   Problem Solving: Slow processing;Decreased initiation;Requires tactile cues;Requires verbal cues General Comments: Pt stating yes ma'am when asked not to move from recliner. Per RN, pt pulling at wires last time they put him in recliner so he required to be back in bed.        Exercises     Shoulder Instructions       General Comments VSS on RA; BP in 138/88 at EOB feeling dizzy, O2 on RA>90%; BP in recliner reclined as pt continued to feel dizzy 130/66, O2 >90%, HR 96 BPM.    Pertinent Vitals/ Pain       Pain Assessment: Faces Faces Pain Scale: Hurts little more Pain Location: L hip Pain Intervention(s): Monitored during session;Repositioned  Home Living                                          Prior Functioning/Environment              Frequency  Min 2X/week        Progress Toward Goals  OT Goals(current goals can now be found in the care plan section)  Progress towards OT goals: Progressing toward goals  Acute Rehab OT Goals Patient Stated Goal: to go home OT Goal Formulation: With patient Time For Goal Achievement: 10/28/20 Potential to Achieve Goals: Good ADL Goals Pt Will Perform Grooming: sitting Pt Will Perform Upper Body Bathing: with min assist;sitting Pt Will Perform Lower Body Bathing: with min assist;sit to/from stand Pt Will Transfer to Toilet: with min assist;stand pivot transfer;bedside commode Pt Will Perform Toileting - Clothing Manipulation and hygiene: with mod assist;sit to/from stand;sitting/lateral leans Additional ADL Goal #1: Pt will be Min A to come up to EOB and back into bed Additional ADL Goal  #2: Pt will follow 75% of one step commands  Plan Discharge plan remains appropriate    Co-evaluation                 AM-PAC OT "6 Clicks" Daily Activity     Outcome Measure   Help from another person eating meals?: A Lot Help from another person taking care of personal grooming?: A Lot Help from another person toileting, which includes using toliet, bedpan, or urinal?: Total Help from another person bathing (including washing, rinsing, drying)?: A Lot Help from another person to put on and taking off regular upper body clothing?: A Lot Help from another person to put on and taking off regular lower body clothing?: Total 6 Click Score: 10    End of Session Equipment Utilized During Treatment: Gait belt;Rolling walker  OT Visit Diagnosis: Unsteadiness on feet (R26.81);Cognitive communication deficit (R41.841);Pain Symptoms and signs involving cognitive functions: Cerebral infarction Pain - Right/Left: Left Pain - part of body: Leg   Activity Tolerance Patient tolerated treatment well   Patient Left in chair;with call bell/phone within reach;with chair alarm set;with nursing/sitter  in room   Nurse Communication Mobility status        Time: 4680-3212 OT Time Calculation (min): 35 min  Charges: OT General Charges $OT Visit: 1 Visit OT Treatments $Therapeutic Activity: 23-37 mins  Jefferey Pica, OTR/L Acute Rehabilitation Services Pager: [phone] Office: [phone]    Pacen Watford C 10/24/2020, 2:54 PM

## 2020-10-24 NOTE — Progress Notes (Signed)
Occupational Therapy Treatment Patient Details Name: [name] MRN: [id number] DOB: 02-23-1951 Today's Date: 10/24/2020    History of present illness Pt is 70 yo admitted 3/30 after fall and found by brother with unknown down time (1-3 days). Fractured L frontal sinus,L L3 transverse process, displaced angulated mildly comminuted L femoral neck fx. AKI with rhabdo. 3/31 pt with hypoxia requiring intubation, extubated 4/1. Pt s/p left anterior hip hemiarthroplasty 4/1. Re-intubated 4/4 for hypoxemia and transfered to ICU, extubated 10/17/20. 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 RN/charge RN asking for pt to return to bed. Pt sit to stand x1 with modA to maxA from recliner and pivotting to bed - pt requires cues to slow down and use of RW for safety. VSS on RA BP 130/60 in supine in bed; dizziness not reported.   Follow Up Recommendations  SNF;Supervision/Assistance - 24 hour    Equipment Recommendations  None recommended by OT    Recommendations for Other Services      Precautions / Restrictions Precautions Precautions: Fall Precaution Comments: Cortrak, rectal tube, O2 Restrictions Weight Bearing Restrictions: No LLE Weight Bearing: Weight bearing as tolerated Other Position/Activity Restrictions: s/p L hemi arthroplasty, anterior approach, no precautions       Mobility Bed Mobility Overal bed mobility: Needs Assistance Bed Mobility: Sit to Supine;Rolling;Sidelying to Sit Rolling: Mod assist Sidelying to sit: Mod assist;HOB elevated   Sit to supine: Min assist;HOB elevated   General bed mobility comments: bed rails for rolling to L, assist with BLEs knees bending and coming off of bed; pt requiring assist for trunk elevation.    Transfers Overall transfer  level: Needs assistance Equipment used: Rolling walker (2 wheeled) Transfers: Sit to/from Stand Sit to Stand: Mod assist         General transfer comment: sit to stand x1 from recliner and pivotting to bed - pt requires cues to slow down and use of RW for safety.    Balance Overall balance assessment: Needs assistance Sitting-balance support: Feet supported;Bilateral upper extremity supported Sitting balance-Leahy Scale: Poor Sitting balance - Comments: minguardA at EOB once feet flat on floor   Standing balance support: Bilateral upper extremity supported Standing balance-Leahy Scale: Poor Standing balance comment: Pt maxA for sit to stand and maxA for stand pivot to to bed, steps to [facility].                           ADL either performed or assessed with clinical judgement   ADL Overall ADL's : Needs assistance/impaired     Grooming: Minimal assistance;Sitting;Cueing for safety                   Toilet Transfer: Maximal assistance;Cueing for safety;Cueing for sequencing;Stand-pivot;RW Toilet Transfer Details (indicate cue type and reason): simulated to recliner <->to bed as pt unsafe in recliner and often becomes anxious and rips all leads off wanting to "go home right now."         Functional mobility during ADLs: Maximal assistance;Cueing for safety;Cueing for sequencing;Rolling walker General ADL Comments: No staff available to assist with pt transfer back to bed so OTR performing task for pt's safety to reutrn to wrist restraints in bed.  Vision   Vision Assessment?: No apparent visual deficits   Perception     Praxis      Cognition Arousal/Alertness: Lethargic;Suspect due to medications Behavior During Therapy: [facility] for tasks assessed/performed Overall Cognitive Status: Impaired/Different from baseline Area of Impairment: Orientation;Memory;Following commands;Safety/judgement;Problem solving                 Orientation Level:  Disoriented to;Place;Time Current Attention Level: Sustained Memory: Decreased short-term memory Following Commands: Follows one step commands inconsistently Safety/Judgement: Decreased awareness of safety;Decreased awareness of deficits   Problem Solving: Slow processing;Decreased initiation;Requires tactile cues;Requires verbal cues General Comments: Pt stating yes ma'am when asked not to move from recliner. Per RN, pt pulling at wires last time they put him in recliner so he required to be back in bed.        Exercises     Shoulder Instructions       General Comments VSS on RA BP 130/60 in supine in bed; dizziness not reported.    Pertinent Vitals/ Pain       Pain Assessment: Faces Faces Pain Scale: Hurts little more Pain Location: L hip Pain Intervention(s): Monitored during session;Repositioned  Home Living                                          Prior Functioning/Environment              Frequency  Min 2X/week        Progress Toward Goals  OT Goals(current goals can now be found in the care plan section)  Progress towards OT goals: Progressing toward goals  Acute Rehab OT Goals Patient Stated Goal: to go home OT Goal Formulation: With patient Time For Goal Achievement: 10/28/20 Potential to Achieve Goals: Good ADL Goals Pt Will Perform Grooming: sitting Pt Will Perform Upper Body Bathing: with min assist;sitting Pt Will Perform Lower Body Bathing: with min assist;sit to/from stand Pt Will Transfer to Toilet: with min assist;stand pivot transfer;bedside commode Pt Will Perform Toileting - Clothing Manipulation and hygiene: with mod assist;sit to/from stand;sitting/lateral leans Additional ADL Goal #1: Pt will be Min A to come up to EOB and back into bed Additional ADL Goal #2: Pt will follow 75% of one step commands  Plan Discharge plan remains appropriate    Co-evaluation                 AM-PAC OT "6 Clicks" Daily  Activity     Outcome Measure   Help from another person eating meals?: A Lot Help from another person taking care of personal grooming?: A Lot Help from another person toileting, which includes using toliet, bedpan, or urinal?: Total Help from another person bathing (including washing, rinsing, drying)?: A Lot Help from another person to put on and taking off regular upper body clothing?: A Lot Help from another person to put on and taking off regular lower body clothing?: Total 6 Click Score: 10    End of Session Equipment Utilized During Treatment: Gait belt;Rolling walker  OT Visit Diagnosis: Unsteadiness on feet (R26.81);Cognitive communication deficit (R41.841);Pain Symptoms and signs involving cognitive functions: Cerebral infarction Pain - Right/Left: Left Pain - part of body: Leg   Activity Tolerance Patient tolerated treatment well   Patient Left in bed;with call bell/phone within reach;with bed alarm set;with restraints reapplied   Nurse Communication Mobility status  Time: 1517-6160 OT Time Calculation (min): 20 min  Charges: OT General Charges $OT Visit: 1 Visit OT Treatments $Self Care/Home Management : 8-22 mins   [name], [name] Acute Rehabilitation Services Pager: [phone] Office: [phone]    Audie Pinto 10/24/2020, 3:00 PM

## 2020-10-24 NOTE — Plan of Care (Signed)
  Problem: Education: Goal: Verbalization of understanding the information provided (i.e., activity precautions, restrictions, etc) will improve Outcome: Progressing Goal: Individualized Educational Video(s) Outcome: Progressing   Problem: Activity: Goal: Ability to ambulate and perform ADLs will improve Outcome: Progressing   Problem: Clinical Measurements: Goal: Postoperative complications will be avoided or minimized Outcome: Progressing   Problem: Self-Concept: Goal: Ability to maintain and perform role responsibilities to the fullest extent possible will improve Outcome: Progressing   Problem: Pain Management: Goal: Pain level will decrease Outcome: Progressing   Problem: Education: Goal: Knowledge of General Education information will improve Description: Including pain rating scale, medication(s)/side effects and non-pharmacologic comfort measures Outcome: Progressing   Problem: Health Behavior/Discharge Planning: Goal: Ability to manage health-related needs will improve Outcome: Progressing   Problem: Clinical Measurements: Goal: Ability to maintain clinical measurements within normal limits will improve Outcome: Progressing Goal: Will remain free from infection Outcome: Progressing Goal: Diagnostic test results will improve Outcome: Progressing Goal: Respiratory complications will improve Outcome: Progressing Goal: Cardiovascular complication will be avoided Outcome: Progressing   Problem: Activity: Goal: Risk for activity intolerance will decrease Outcome: Progressing   Problem: Nutrition: Goal: Adequate nutrition will be maintained Outcome: Progressing   Problem: Coping: Goal: Level of anxiety will decrease Outcome: Progressing   Problem: Elimination: Goal: Will not experience complications related to bowel motility Outcome: Progressing Goal: Will not experience complications related to urinary retention Outcome: Progressing   Problem: Pain  Managment: Goal: General experience of comfort will improve Outcome: Progressing   Problem: Safety: Goal: Ability to remain free from injury will improve Outcome: Progressing   Problem: Skin Integrity: Goal: Risk for impaired skin integrity will decrease Outcome: Progressing   Problem: Safety: Goal: Non-violent Restraint(s) Outcome: Progressing

## 2020-10-24 NOTE — Progress Notes (Signed)
Daily Progress Note   Patient Name: [name]       Date: 10/24/2020 DOB: [birth year]  Age: 70 y.o. MRN#: [id number] Attending Physician: Allie Bossier, MD Primary Care Physician: [name], MD Admit Date: 10/10/2020  Reason for Consultation/Follow-up: Establishing goals of care  Subjective: [name] is weak, but is doing well. Continues to endorse full scope/full code care. He is eating more.  His brothers have avoided meeting with Palliative.   ROS  Length of Stay: 14  Current Medications: Scheduled Meds:  . arformoterol  15 mcg Nebulization BID  . aspirin  81 mg Per Tube Daily  . chlorhexidine gluconate (MEDLINE KIT)  15 mL Mouth Rinse BID  . Chlorhexidine Gluconate Cloth  6 each Topical Daily  . doxazosin  2 mg Per Tube Daily  . famotidine  20 mg Per Tube BID  . feeding supplement (PROSource TF)  45 mL Per Tube BID  . free water  150 mL Per Tube Q4H  . gabapentin  100 mg Per Tube Q8H  . heparin injection (subcutaneous)  5,000 Units Subcutaneous Q8H  . hydrALAZINE  10 mg Intravenous Once  . insulin aspart  0-9 Units Subcutaneous Q4H  . mouth rinse  15 mL Mouth Rinse q12n4p  . melatonin  5 mg Oral QHS  . mirtazapine  30 mg Per Tube QHS  . nicotine  14 mg Transdermal Daily  . revefenacin  175 mcg Nebulization Daily  . sodium chloride flush  10-40 mL Intracatheter Q12H    Continuous Infusions: . sodium chloride 10 mL/hr at 10/19/20 1500  . ampicillin-sulbactam (UNASYN) IV 3 g (10/24/20 1300)  . feeding supplement (JEVITY 1.5 CAL/FIBER) 1,000 mL (10/22/20 0934)    PRN Meds: sodium chloride, acetaminophen **OR** acetaminophen, albuterol, ALPRAZolam, ondansetron (ZOFRAN) IV, Resource ThickenUp Clear, sodium chloride flush  Physical Exam          Vital Signs:  BP (!) 126/53 (BP Location: Left Arm)   Pulse 85   Temp 98.4 F (36.9 C) (Oral)   Resp 19   Ht _0  (1.753 m)   Wt 60.9 kg   SpO2 96%   BMI 19.83 kg/m  SpO2: SpO2: 96 % O2 Device: O2 Device: Room Air O2 Flow Rate: O2 Flow Rate (L/min): 3 L/min  Intake/output summary:   Intake/Output Summary (  Last 24 hours) at 10/24/2020 1457 Last data filed at 10/24/2020 0932 Gross per 24 hour  Intake 940 ml  Output 3000 ml  Net -2060 ml   LBM: Last BM Date: 10/24/20 Baseline Weight: Weight: 66.7 kg Most recent weight: Weight: 60.9 kg       Palliative Assessment/Data: PPS: 30%      Patient Active Problem List   Diagnosis Date Noted  . Oropharyngeal dysphagia   . Status post total replacement of left hip   . Non-traumatic rhabdomyolysis   . Fall   . Left-sided weakness   . Acute respiratory failure with hypoxia ([place])   . Protein-calorie malnutrition, severe 10/16/2020  . Closed fracture of left hip ([place])   . Pressure injury of skin 10/11/2020  . Sepsis without septic shock ([place])   . Left displaced femoral neck fracture ([place])   . Delirium   . Closed fracture of frontal sinus ([place])   . Closed fracture of transverse process of lumbar vertebra ([place])   . Goals of care, counseling/discussion 12/19/2019  . Iron deficiency anemia 09/12/2019  . Small cell lung cancer in adult [facility]) 02/05/2017  . Lymphadenopathy of head and neck 01/22/2017  . Osteopenia 03/30/2015  . Clubbing of fingers 04/14/2014  . Hyperglycemia 10/20/2013  . Smoker   . Closed dislocation of acromioclavicular joint 04/11/2010  . BREAST MASS, LEFT 01/20/2008  . ONYCHOMYCOSIS, TOENAILS 10/20/2007  . MUSCLE CRAMPS 10/20/2007  . Hypercholesterolemia 05/28/2007  . CATARACT NOS 08/28/2006  . DISTURBANCE, VISUAL NOS 08/19/2006  . COPD (chronic obstructive pulmonary disease) with emphysema ([place]) 08/19/2006  . WITHDRAWAL, DRUG 06/24/2006  . ANXIETY 06/24/2006  . DEPRESSION 06/24/2006  . MYOCARDIAL INFARCTION, HX OF  06/24/2006  . Coronary atherosclerosis 06/24/2006  . CARDIAC ARRHYTHMIA 06/24/2006  . CONSTIPATION 06/24/2006  . OSTEOARTHRITIS 06/24/2006  . LOW BACK PAIN 06/24/2006  . 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 He remains confused. Has significant dysphagia and is currently NPO with coretrak in place. Palliative medicine consulted for goals of care for this very ill man with multiple comorbidities who is at high risk of decompensation and dying.  Assessment/Recommendations/Plan  Continue full scope/full code Family has avoided Palliative discussions Recommend continued followup by outpatient Palliative Palliative team will not follow for now as goals are clear- please call or reconsult if patient has acute change in status or further specific decision making is needed  Goals of Care and Additional Recommendations: Limitations on Scope of Treatment: Full Scope Treatment  Code Status: Full code  Prognosis:  Unable to determine  Discharge Planning: To Be Determined  Care plan was discussed with patient.   Thank you for allowing the Palliative Medicine Team to assist in the care of this patient.   Total time: 36 mins Greater than 50%  of this time was spent counseling and coordinating care related to the above assessment and plan.  Mariana Kaufman, AGNP-C Palliative Medicine   Please contact Palliative Medicine Team phone at [phone] for questions and concerns.

## 2020-10-24 NOTE — Progress Notes (Incomplete)
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  Patient was found by brother on 3/30 after he had been unable to reach patient for unknown period of time (1 to 3 days suspected).  Patient fracture left frontal sinus, L3 transverse process, displaced angulated mildly comminuted left femoral neck fracture.  AKI with rhabdomyolysis.  On 3/31 patient develop worsening hypoxia and respiratory distress, intubated.  Subsequently extubated 4/2 and transferred out of the ICU on 4/3.  The evening of 4/3--4/4 he developed increased work of breathing and increased oxygen needs requiring intubation.  This was thought to be related to an aspiration event.   Subjective: ***   Assessment & Plan: Covid vaccination;   Active Problems:   Small cell lung cancer in adult [facility])   Pressure injury of skin   Closed fracture of left hip (HCC)   Protein-calorie malnutrition, severe   Status post total replacement of left hip   Non-traumatic rhabdomyolysis   Fall   Left-sided weakness   Acute respiratory failure with hypoxia (HCC)   1-Acute Hypoxic Respiratory Failure, Aspiration Pneumonia, COPD Emphysema Mucus Plug 4/05. History of a small cell lung cancer status post chemotherapy with lurbinectedin last cycle 09/12/20. -Extubated 4\6 -Completed 7 days of cefepime 4/05. -Brovana/Yupelri nebulizer treatments and PRN albuterol.  -received Solumedrol on 4/05 during mucus plug event, prednisone for COPD.  -Patient  To be discharge on Incruse and Dulera. On nebulizer while inpatient.  Patient became hypoxic again last night requiring 4 to 6 L of oxygen. -Chest PT to see if he can bring up his secretions. -Start him on Unasyn due to  worsening cough and leukocytosis. -Incentive spirometry and flutter well.  2-Nausea vomiting: Continue to feel nauseated during attempt to give p.o. In setting of constipation, KUB nonobstructive bowel pattern  Nutrition;  On tube feeding.  Through core track. Swallow evaluation was attempted again today but patient was keeping pured diet and his mouth, current recommendations remained to maintain n.p.o. and continue feeding through tube.  If patient needs long-term tube feeding-we will order PEG tube with IR. -Swallow team placed him on dysphagia 2 diet with thick liquid after getting a barium swallow, please see the full report-remain high risk for aspiration.  GI, Diarrhea; resolved after stopping laxative and change in his tube feed formula.  Acute metabolic encephalopathy: Delirium.  Appears to be at baseline now. Likely in the setting of acute respiratory failure, due to aspiration pneumonia and ICU delirium. Continue Gabapentin,dose was reduced to 100 mg 3 times daily from 300 mg. Patient at time started talking nonsense stories and at times become more reasonable, fluctuating mentation.  Metastatic small cell carcinoma -Lung, liver -Follows with Dr. Delton Coombes.  -CT head; No acute intracranial pathology   Left femoral Neck Fx S/P Hemiarthroplasty:  Ortho following.  Patient remove wound vac last night.  Nurse will page ortho and inform them. /  PT following-recommending SNF placement.  Left Facial Fx, L 3 TP Fx:  Neurosurgery  evaluated , no surgical intervention needed.  Neurosurgery recommend ENT consult. Spoke with ENT on called for [place] recommend Conservative management, Fracture is non displaced, does not need anything acute.   Rhabdomyolysis.  Resolved with IV fluids.   Thrombocytopenia, anemia;  Platelet improving.  Hb stable and seems improving slowly.  S/p PRBC on 10/17/2020  Tabbacco abuse. Needs counseling.   Anemia unspecified***** -4/13 anemia  panel pending -4/13 occult blood pending  Sepsis was ruled out.    Pressure Injury 10/11/20 Sacrum Medial Deep Tissue Pressure Injury - Purple or maroon localized area of discolored intact skin or blood-filled blister due to damage of underlying soft tissue from pressure and/or shear. (Active)  10/11/20 0220  Location: Sacrum  Location Orientation: Medial  Staging: Deep Tissue Pressure Injury - Purple or maroon localized area of discolored intact skin or blood-filled blister due to damage of underlying soft tissue from pressure and/or shear.  Wound Description (Comments):   Present on Admission: Yes     Pressure Injury 10/15/20 Heel Right Deep Tissue Pressure Injury - Purple or maroon localized area of discolored intact skin or blood-filled blister due to damage of underlying soft tissue from pressure and/or shear. (Active)  10/15/20 0323  Location: Heel  Location Orientation: Right  Staging: Deep Tissue Pressure Injury - Purple or maroon localized area of discolored intact skin or blood-filled blister due to damage of underlying soft tissue from pressure and/or shear.  Wound Description (Comments):   Present on Admission: No           Body mass index is 19.83 kg/m.  ***   DVT prophylaxis: *** Code Status: *** Family Communication: *** Status is: Inpatient  {Inpatient:23812}  Dispo: The patient is from: {From:23814}              Anticipated d/c is to: {To:23815}              Anticipated d/c date is: {Days:23816}              Patient currently {Medically stable:23817}      Consultants:  ***  Procedures/Significant Events:  3/30 admitted to [facility] for encephalopathy. Facial fx, L3 TP fx, Hip fx.  -3/31 non op from [place] standpoint. Ortho planned to OR 3/31 however acute hypoxic respiratory failure, transferred to ICU and emergently intubated.  -3/31 ETT >>4/1 -3/31 bronchoscopy with BAL left lower lobe -4/1 LT Hip Hemiarthroplasty (Anterior Approach) -4/4  ETT>>4/6 Currently stable on room air.  PT is recommending SNF. Palliative care was also consulted-patient will remain full code with full scope of medical care.  -4/12.. Patient with worsening cough and leukocytosis, started on Unasyn for concern of aspiration.   I have personally reviewed and interpreted all radiology studies and my findings are as above.  VENTILATOR SETTINGS: ***   Cultures ***  Antimicrobials: ***   Devices ***   LINES / TUBES:  ***    Continuous Infusions: . sodium chloride 10 mL/hr at 10/19/20 1500  . ampicillin-sulbactam (UNASYN) IV 3 g (10/24/20 0039)  . feeding supplement (JEVITY 1.5 CAL/FIBER) 1,000 mL (10/22/20 0934)     Objective: Vitals:   10/23/20 2339 10/24/20 0359 10/24/20 0420 10/24/20 0728  BP: (!) 135/50 (!) 130/59  (!) 144/54  Pulse: 99 80  84  Resp: _0 Temp: 98.6 F (37 C) 98.4 F (36.9 C)  98.7 F (37.1 C)  TempSrc: Oral Oral  Oral  SpO2: 95% 97%  96%  Weight:   60.9 kg   Height:        Intake/Output Summary (Last 24 hours) at 10/24/2020 0759 Last data filed at 10/24/2020 0359 Gross per 24 hour  Intake 700 ml  Output 3000 ml  Net -2300 ml   [employer]  10/21/20 0358 10/23/20 0500 10/24/20 0420  Weight: 63.8 kg 62.6 kg 60.9 kg    Examination:  General: No acute respiratory distress Eyes: negative scleral hemorrhage, negative anisocoria, negative icterus*** ENT: Negative Runny nose, negative gingival bleeding,*** Neck:  Negative scars, masses, torticollis, lymphadenopathy, JVD*** Lungs: Clear to auscultation bilaterally without wheezes or crackles Cardiovascular: Regular rate and rhythm without murmur gallop or rub normal S1 and S2 Abdomen: negative abdominal pain, nondistended, positive soft, bowel sounds, no rebound, no ascites, no appreciable mass Extremities: No significant cyanosis, clubbing, or edema bilateral lower extremities Skin: Negative rashes, lesions, ulcers*** Psychiatric:   Negative depression, negative anxiety, negative fatigue, negative mania *** Central nervous system:  Cranial nerves II through XII intact, tongue/uvula midline, all extremities muscle strength 5/5, sensation intact throughout, finger nose finger bilateral within normal limits, quick finger touch bilateral within normal limits, negative Romberg sign, heel to shin bilateral within normal limits, standing on 1 foot bilateral within normal limits, walking on tiptoes within normal limits, walking on heels within normal limits, negative dysarthria, negative expressive aphasia, negative receptive aphasia.***  .     Data Reviewed: Care during the described time interval was provided by me .  I have reviewed this patient's available data, including medical history, events of note, physical examination, and all test results as part of my evaluation.  CBC: Recent Labs  Lab 10/17/20 0857 10/18/20 0447 10/19/20 1145 10/20/20 0206 10/23/20 0435  WBC 7.0 6.6 6.2 6.8 11.1*  HGB 6.5* 7.4* 9.1* 9.5* 9.6*  HCT 19.2* 21.8* 27.6* 28.1* 29.0*  MCV 93.2 93.2 93.6 90.9 94.2  PLT 84* 104* 146* 155 277   Basic Metabolic Panel: Recent Labs  Lab 10/17/20 0855 10/18/20 0447 10/18/20 0733 10/19/20 0544 10/20/20 0206  NA 138 139  --  137 134*  K 3.9 3.3*  --  3.7 3.7  CL 106 107  --  104 102  CO2 25 28  --  28 25  GLUCOSE 138* 114*  --  124* 120*  BUN 25* 30*  --  22 20  CREATININE 0.71 0.75  --  0.68 0.66  CALCIUM 8.1* 8.3*  --  8.3* 8.6*  MG  --  2.3  --   --   --   PHOS  --   --  2.3*  --   --    GFR: Estimated Creatinine Clearance: 75.1 mL/min (by C-G formula based on SCr of 0.66 mg/dL). Liver Function Tests: No results for input(s): AST, ALT, ALKPHOS, BILITOT, PROT, ALBUMIN in the last 168 hours. No results for input(s): LIPASE, AMYLASE in the last 168 hours. No results for input(s): AMMONIA in the last 168 hours. Coagulation Profile: No results for input(s): INR, PROTIME in the last 168  hours. Cardiac Enzymes: No results for input(s): CKTOTAL, CKMB, CKMBINDEX, TROPONINI in the last 168 hours. BNP (last 3 results) No results for input(s): PROBNP in the last 8760 hours. HbA1C: No results for input(s): HGBA1C in the last 72 hours. CBG: Recent Labs  Lab 10/23/20 1535 10/23/20 2031 10/24/20 0046 10/24/20 0414 10/24/20 0726  GLUCAP 96 121* 104* 131* 105*   Lipid Profile: No results for input(s): CHOL, HDL, LDLCALC, TRIG, CHOLHDL, LDLDIRECT in the last 72 hours. Thyroid Function Tests: No results for input(s): TSH, T4TOTAL, FREET4, T3FREE, THYROIDAB in the last 72 hours. Anemia Panel: No results for input(s): VITAMINB12, FOLATE, FERRITIN, TIBC, IRON, RETICCTPCT in the last 72 hours. Sepsis Labs: Recent Labs  Lab 10/23/20 1245  PROCALCITON <0.10  Recent Results (from the past 240 hour(s))  Culture, Respiratory w Gram Stain     Status: None   Collection Time: 10/15/20  4:26 AM   Specimen: Tracheal Aspirate; Respiratory  Result Value Ref Range Status   Specimen Description TRACHEAL ASPIRATE  Final   Special Requests NONE  Final   Gram Stain   Final    NO WBC SEEN RARE BUDDING YEAST SEEN Performed at [facility], [address], [place], [place] [address]    Culture FEW CANDIDA KRUSEI  Final   Report Status 10/17/2020 FINAL  Final  MRSA PCR Screening     Status: None   Collection Time: 10/15/20  9:45 AM   Specimen: Nasopharyngeal  Result Value Ref Range Status   MRSA by PCR NEGATIVE NEGATIVE Final    Comment:        The GeneXpert MRSA Assay (FDA approved for NASAL specimens only), is one component of a comprehensive MRSA colonization surveillance program. It is not intended to diagnose MRSA infection nor to guide or monitor treatment for MRSA infections. Performed at [facility], [place] [address] [place], [place] [address]          Radiology Studies: DG CHEST PORT 1 VIEW  Result Date: 10/23/2020 CLINICAL DATA:  Cough. EXAM:  PORTABLE CHEST 1 VIEW COMPARISON:  10/16/2020 FINDINGS: Stable normal sized heart. Airspace opacity is again demonstrated in the left lower lobe with mild improvement. The remainder of the lungs are clear and hyperexpanded with mild chronic prominence of the interstitial markings. Right jugular porta catheter tip at the superior cavoatrial junction. Feeding tube extending into the stomach. Unremarkable bones. IMPRESSION: 1. Mildly improved left lower lobe pneumonia. 2. Mild changes of COPD. Electronically Signed   By: Claudie Revering M.D.   On: 10/23/2020 08:37   DG Swallowing Func-Speech Pathology  Result Date: 10/22/2020 Objective Swallowing Evaluation: Type of Study: MBS-Modified Barium Swallow Study  Patient Details Name: [name] MRN: [id number] Date of Birth: 06-02-1951 Today's Date: 10/22/2020 Time: SLP Start Time (ACUTE ONLY): 1440 -SLP Stop Time (ACUTE ONLY): 1455 SLP Time Calculation (min) (ACUTE ONLY): 15 min Past Medical History: Past Medical History: Diagnosis Date . Anxiety  . CAD (coronary artery disease)  . Cancer ([place])   stage 4 small cell lung cancer  . COPD (chronic obstructive pulmonary disease) ([place])  . Depression  . Dyspnea   increased exertion . Feeling of chest tightness  . Heart palpitations  . History of chemotherapy  . Myocardial infarction ([place])  . Osteopenia  . Panic attacks  . Smoker  Past Surgical History: Past Surgical History: Procedure Laterality Date . BACK SURGERY  12/24/2000  L5,S1 . CORONARY STENT PLACEMENT  2005  RCA & CX . HERNIA REPAIR Right 1980's . INGUINAL HERNIA REPAIR  12/1978  right side . IR FLUORO GUIDE PORT INSERTION RIGHT  04/02/2017 . IR US GUIDE BX ASP/DRAIN  02/03/2017 . IR US GUIDE VASC ACCESS RIGHT  04/02/2017 . NM MYOCAR PERF WALL MOTION  09/07/2009  No ischemia; EF 51% . SHOULDER SURGERY Left 08/2010 . SPINE SURGERY  2002  L5-S1 . TOTAL HIP ARTHROPLASTY Left 10/12/2020  Procedure: TOTALhemi  HIP ARTHROPLASTY ANTERIOR APPROACH;  Surgeon: [name], MD;   Location: [facility];  Service: Orthopedics;  Laterality: Left; HPI: Pt is a 70 yo male admitted 3/30 with AMS after fall with unknown downtime (1-3 days suspected). Pt was found to have L hip fx, L L3 transverse process fx, L frontal  sinus fx, and AKI with rhabdomyolysis. On 3/31 pt had worsening hypoxia thought to be related to an aspiration event, requiring ETT 3/31-4/2; another suspected aspiration event on requiring re-intubation 4/4-4/6. 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 Pt was reporting intermittent difficulty swallowing solids in 2019 per cancer center notes.  Subjective: Alert, but pleasantly confused Assessment / Plan / Recommendation CHL IP CLINICAL IMPRESSIONS 10/22/2020 Clinical Impression Pt presents with oropharyngeal dysphagia characterized by reduced bolus cohesion, a pharyngeal delay, reduced lingual retraction, and reduced anterior laryngeal movement. He demonstrated premature spillage to the valleculae with inconsistent spillover to the pyriform sinuses, base of tongue residue, vallecular residue, pyriform sinus residue, penetration (PAS 3, 5) and inconsistent aspiration (PAS 7) of thin and nectar thick iquids. Aspiration consistently triggered a spontaneous cough which was inconsistently effective in expelling the aspirate. Penetration was improved and aspiration eliminated with reduced bolus sizes of nectar thick and thin liquids via cup; however, pt was unable to consistently reduce bolus size and eliminate use of a posterior head tilt despite cues and prompts. Pharyngeal residue was reduced with use of a liquid wash and pt was able to swallow a barium tablet with honey thick liquids via cup. A dysphagia 2 diet with honey thick liquids will be initiated at this time. However, there is potential for advancement of liquids once pt is able to demonstrate use of swallowing precautions.  SLP will follow for treatment. SLP Visit Diagnosis Dysphagia, oropharyngeal phase (R13.12) Attention and concentration deficit following -- Frontal lobe and executive function deficit following -- Impact on safety and function Mild aspiration risk;Moderate aspiration risk   CHL IP TREATMENT RECOMMENDATION 10/22/2020 Treatment Recommendations Therapy as outlined in treatment plan below;F/U MBS in --- days (Comment)   Prognosis 10/22/2020 Prognosis for Safe Diet Advancement Good Barriers to Reach Goals Cognitive deficits Barriers/Prognosis Comment -- CHL IP DIET RECOMMENDATION 10/22/2020 SLP Diet Recommendations Dysphagia 2 (Fine chop) solids;Honey thick liquids Liquid Administration via Cup;Straw Medication Administration Whole meds with puree Compensations Slow rate;Small sips/bites;Minimize environmental distractions;Follow solids with liquid Postural Changes Remain semi-upright after after feeds/meals (Comment);Seated upright at 90 degrees   CHL IP OTHER RECOMMENDATIONS 10/22/2020 Recommended Consults -- Oral Care Recommendations Oral care BID Other Recommendations Order thickener from pharmacy   CHL IP FOLLOW UP RECOMMENDATIONS 10/22/2020 Follow up Recommendations Skilled Nursing facility   [facility] IP FREQUENCY AND DURATION 10/22/2020 Speech Therapy Frequency (ACUTE ONLY) min 2x/week Treatment Duration 2 weeks      CHL IP ORAL PHASE 10/22/2020 Oral Phase Impaired Oral - Pudding Teaspoon -- Oral - Pudding Cup -- Oral - Honey Teaspoon -- Oral - Honey Cup -- Oral - Nectar Teaspoon -- Oral - Nectar Cup Decreased bolus cohesion;Premature spillage Oral - Nectar Straw Decreased bolus cohesion;Premature spillage Oral - Thin Teaspoon -- Oral - Thin Cup Decreased bolus cohesion;Premature spillage Oral - Thin Straw Decreased bolus cohesion;Premature spillage Oral - Puree WFL Oral - Mech Soft -- Oral - Regular Impaired mastication Oral - Multi-Consistency -- Oral - Pill WFL Oral Phase - Comment --  CHL IP PHARYNGEAL PHASE 10/22/2020  Pharyngeal Phase Impaired Pharyngeal- Pudding Teaspoon -- Pharyngeal -- Pharyngeal- Pudding Cup -- Pharyngeal -- Pharyngeal- Honey Teaspoon -- Pharyngeal -- Pharyngeal- Honey Cup -- Pharyngeal -- Pharyngeal- Nectar Teaspoon -- Pharyngeal -- Pharyngeal- Nectar Cup Trace aspiration;Pharyngeal residue - valleculae;Pharyngeal residue - pyriform;Reduced tongue base retraction;Penetration/Aspiration during swallow;Penetration/Apiration after swallow;Reduced anterior laryngeal mobility;Delayed swallow initiation-vallecula;Delayed swallow initiation-pyriform sinuses Pharyngeal Material  enters airway, remains ABOVE vocal cords and not ejected out;Material enters airway, passes BELOW cords and not ejected out despite cough attempt by patient Pharyngeal- Nectar Straw Trace aspiration;Pharyngeal residue - valleculae;Pharyngeal residue - pyriform;Reduced tongue base retraction;Penetration/Aspiration during swallow;Penetration/Apiration after swallow;Reduced anterior laryngeal mobility;Delayed swallow initiation-vallecula;Delayed swallow initiation-pyriform sinuses Pharyngeal Material enters airway, CONTACTS cords and not ejected out;Material enters airway, passes BELOW cords and not ejected out despite cough attempt by patient Pharyngeal- Thin Teaspoon -- Pharyngeal -- Pharyngeal- Thin Cup Trace aspiration;Pharyngeal residue - valleculae;Pharyngeal residue - pyriform;Reduced tongue base retraction;Penetration/Aspiration during swallow;Penetration/Apiration after swallow;Reduced anterior laryngeal mobility;Delayed swallow initiation-vallecula;Delayed swallow initiation-pyriform sinuses Pharyngeal Material enters airway, passes BELOW cords and not ejected out despite cough attempt by patient;Material enters airway, remains ABOVE vocal cords and not ejected out Pharyngeal- Thin Straw Trace aspiration;Pharyngeal residue - valleculae;Pharyngeal residue - pyriform;Reduced tongue base retraction;Penetration/Aspiration during  swallow;Penetration/Apiration after swallow;Reduced anterior laryngeal mobility;Delayed swallow initiation-vallecula;Delayed swallow initiation-pyriform sinuses Pharyngeal Material enters airway, CONTACTS cords and not ejected out;Material enters airway, passes BELOW cords and not ejected out despite cough attempt by patient Pharyngeal- Puree Pharyngeal residue - valleculae;Pharyngeal residue - pyriform;Reduced tongue base retraction;Reduced anterior laryngeal mobility;Delayed swallow initiation-vallecula;Delayed swallow initiation-pyriform sinuses Pharyngeal -- Pharyngeal- Mechanical Soft -- Pharyngeal -- Pharyngeal- Regular -- Pharyngeal -- Pharyngeal- Multi-consistency -- Pharyngeal -- Pharyngeal- Pill Pharyngeal residue - valleculae;Pharyngeal residue - pyriform;Reduced tongue base retraction;Reduced anterior laryngeal mobility;Delayed swallow initiation-vallecula;Delayed swallow initiation-pyriform sinuses Pharyngeal -- Pharyngeal Comment --  CHL IP CERVICAL ESOPHAGEAL PHASE 10/22/2020 Cervical Esophageal Phase WFL Pudding Teaspoon -- Pudding Cup -- Honey Teaspoon -- Honey Cup -- Nectar Teaspoon -- Nectar Cup -- Nectar Straw -- Thin Teaspoon -- Thin Cup -- Thin Straw -- Puree -- Mechanical Soft -- Regular -- Multi-consistency -- Pill -- Cervical Esophageal Comment -- Shanika I. Hardin Negus, [place], [place] Office number [phone] Pager [phone] Horton Marshall 10/22/2020, 4:50 PM                   Scheduled Meds: . arformoterol  15 mcg Nebulization BID  . aspirin  81 mg Per Tube Daily  . chlorhexidine gluconate (MEDLINE KIT)  15 mL Mouth Rinse BID  . Chlorhexidine Gluconate Cloth  6 each Topical Daily  . doxazosin  2 mg Per Tube Daily  . famotidine  20 mg Per Tube BID  . feeding supplement (PROSource TF)  45 mL Per Tube BID  . free water  150 mL Per Tube Q4H  . gabapentin  100 mg Per Tube Q8H  . heparin injection (subcutaneous)  5,000 Units Subcutaneous Q8H  .  hydrALAZINE  10 mg Intravenous Once  . insulin aspart  0-9 Units Subcutaneous Q4H  . mouth rinse  15 mL Mouth Rinse q12n4p  . melatonin  5 mg Oral QHS  . mirtazapine  30 mg Per Tube QHS  . nicotine  14 mg Transdermal Daily  . revefenacin  175 mcg Nebulization Daily  . sodium chloride flush  10-40 mL Intracatheter Q12H   Continuous Infusions: . sodium chloride 10 mL/hr at 10/19/20 1500  . ampicillin-sulbactam (UNASYN) IV 3 g (10/24/20 0039)  . feeding supplement (JEVITY 1.5 CAL/FIBER) 1,000 mL (10/22/20 0934)     LOS: 14 days    Time spent:40 min***    WOODS, Geraldo Docker, MD [facility]   If 7PM-7AM, please contact night-coverage 10/24/2020, 7:59 AM

## 2020-10-24 NOTE — Progress Notes (Signed)
[facility] PROGRESS NOTE  [name] TKZ:[id number] DOB: 01-20-1951 DOA: 10/10/2020 PCP: [name], MD  Status: Remains inpatient appropriate because:Altered mental status, Unsafe d/c plan and Inpatient level of care appropriate due to severity of illness   Dispo:  Patient From: Home  Planned Disposition: [place]  Medically stable for discharge: No  Barriers to DC: Continues to require supplemental tube feeding via core track tube.  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  Patient was found by brother on 3/30 after he had been unable to reach patient for unknown period of time (1 to 3 days suspected).  Patient fracture left frontal sinus, L3 transverse process, displaced angulated mildly comminuted left femoral neck fracture.  AKI with rhabdomyolysis.  On 3/31 patient develop worsening hypoxia and respiratory distress, intubated.  Subsequently extubated 4/2 and transferred out of the ICU on 4/3.  The evening of 4/3--4/4 he developed increased work of breathing and increased oxygen needs requiring intubation.  This was thought to be related to an aspiration event.  Significant events: -3/30 admitted to [facility] for encephalopathy. Facial fx, L3 TP fx, Hip fx.  -3/31 non op from [place] standpoint. Ortho planned to OR 3/31 however acute hypoxic respiratory failure, transferred to ICU and emergently intubated.  -3/31 ETT >>4/1 -3/31 bronchoscopy with BAL left lower lobe -4/1 LT Hip Hemiarthroplasty (Anterior  Approach) -4/4 ETT>>4/6 Currently stable on room air.  PT is recommending SNF. Palliative care was also consulted-patient will remain full code with full scope of medical care. -4/12.. Patient with worsening cough and leukocytosis, started on Unasyn for concern of aspiration.   Subjective: Awake.  No specific complaints in regards to shortness of breath or discomfort in abdomen.  Trying to eat D2 diet but having some difficulty navigating around core track tube in nose.  Requires Strahl to ingest fluids around core track tube.  Eager to discharge back to home.  Objective: Vitals:   10/23/20 2339 10/24/20 0359  BP: (!) 135/50 (!) 130/59  Pulse: 99 80  Resp: 20 15  Temp: 98.6 F (37 C) 98.4 F (36.9 C)  SpO2: 95% 97%    Intake/Output Summary (Last 24 hours) at 10/24/2020 0724 Last data filed at 10/24/2020 0359 Gross per 24 hour  Intake 700 ml  Output 3000 ml  Net -2300 ml   Filed Weights   10/21/20 0358 10/23/20 0500 10/24/20 0420  Weight: 63.8 kg 62.6 kg 60.9 kg    Exam:  Constitutional: Alert, calm, comfortable in no acute distress Respiratory: Anterior lung sounds are slightly coarse but otherwise clear to auscultation somewhat diminished in the bases.  Room air  Cardiovascular: S1-S2, normotensive, no tachycardia, no peripheral edema Abdomen: Soft and nontender.  Core track inserted nasally.  Normal active bowel sounds.  Tube feeding at 50 cc/h LBM 4/10 Musculoskeletal: no clubbing / cyanosis. No joint deformity upper and lower extremities. Good ROM, no contractures. Normal muscle tone.  Skin: no rashes, lesions, ulcers. No induration Neurologic: CN 2-12 grossly intact. Psychiatric: Alert and oriented x3.  Pleasant affect   Assessment/Plan: Acute problems: Acute Hypoxic Respiratory Failure 2/2: Recurrent aspiration Pneumonia COPD without exacerbation/small cell lung cancer Recent Mucus Plug 4/05. -Extubated 4/6 -Completed 7 days of cefepime  4/05. -Brovana/Yupelri nebulizer treatments and PRN albuterol.  -received Solumedrol on 4/05 during mucus plug event, prednisone for COPD.  -Preadmission bronchodilators Incruse and Dulera. On nebulizer while inpatient.  -Chest PT to aid mobilization of secretions -4/12 initiated IV Unasyn due to worsening cough and low-grade leukocytosis with concerns over recurrent aspiration event.  Subsequently WBC is down to 7.100 -Incentive spirometry and flutter valve  Left femoral Neck Fx S/P Hemiarthroplasty:  Ortho following.  Continue wound vac  Nurse will page ortho and inform them. /  PT following-recommending SNF placement.  Left Facial Fx, L 3 TP Fx:  Neurosurgery  evaluated , no surgical intervention needed.  Previous attending physician spoke with ENT- Conservative management recommended since fracture is non displaced  Acute metabolic encephalopathy: Delirium.   Appears to be at baseline now. Likely in the setting of acute respiratory failure, due to aspiration pneumonia and ICU delirium. Continue Gabapentin,dose was reduced to 100 mg 3 times daily from 300 mg. As of 4/13 patient appropriate  Nausea vomiting:  As of 4/13 no reports of nausea Occurred in setting of constipation, 4/5 KUB nonobstructive bowel pattern LBM 4/10  Dysphagia /moderate to severe protein calorie malnutrition Continue tube feeding via cortrack. -4/11 repeat MBSS dysphagia 2 diet with thick liquid -Monitor oral intake to ensure adequate nutrition before discontinuation of tube feeding. Consider transition to nocturnal tube feedings to improve daytime intake. -Eating about 75% of meals Nutrition Problem: Severe Malnutrition Etiology: chronic illness (small cell carcinoma of the lung) Signs/Symptoms: severe muscle depletion,severe fat depletion Interventions: Tube feeding,Prostat Estimated body mass index is 19.83 kg/m as calculated from the following:   Height as of this encounter: _0  (1.753 m).    Weight as of this encounter: 60.9 kg.   Thrombocytopenia, anemia; Hb stable at 9.8 as of 4/13 with normal platelets 295,000. S/p PRBC on 10/17/2020   Wounds: Pressure Injury 10/11/20 Sacrum Medial Deep Tissue Pressure Injury - Purple or maroon localized area of discolored intact skin or blood-filled blister due to damage of underlying soft tissue from pressure and/or shear. (Active)  Date First Assessed/Time First Assessed: 10/11/20 0220   Location: Sacrum  Location Orientation: Medial  Staging: Deep Tissue Pressure Injury - Purple or maroon localized area of discolored intact skin or blood-filled blister due to damage of underlyi...    Assessments 10/11/2020  8:00 PM 10/23/2020  8:25 PM  Dressing Type Foam - Lift dressing to assess site every shift Foam - Lift dressing to assess site every shift  Dressing -- Clean;Dry  Dressing Change Frequency PRN PRN  Site / Wound Assessment Clean;Dry;Red Dry;Pink  Peri-wound Assessment -- Intact  Drainage Amount None None  Drainage Description -- Odor  Treatment Off loading Off loading     No Linked orders to display     Incision (Closed) 10/12/20 Hip Left (Active)  Date First Assessed/Time First Assessed: 10/12/20 1319   Location: Hip  Location Orientation: Left    Assessments 10/12/2020  4:00 PM 10/23/2020  8:25 PM  Dressing Type Negative pressure wound therapy None  Dressing Clean;Dry;Intact Dry;Intact  Dressing Change Frequency -- PRN  Site / Wound Assessment Dressing in place / Unable to assess Dry;Other (Comment)  Margins -- Unattached edges (unapproximated)  Closure -- None  Drainage Amount None None  Drainage Description -- No odor  Treatment Negative pressure wound therapy;Off loading Negative pressure wound therapy     No Linked orders to display     Negative Pressure Wound Therapy Thigh Anterior;Left;Proximal (Active)  Placement Date/Time: 10/12/20 1323   Wound Type: Incision (Closed wound)  Location: Thigh  Location Orientation:  Anterior;Left;Proximal    Assessments 10/12/2020  4:00 PM 10/18/2020  8:00 PM  Site / Wound Assessment Dressing in place / Unable to assess Dressing in place / Unable to assess  Peri-wound Assessment -- Intact  Wound filler - Black foam 100 --  Cycle Continuous;On --  Canister Changed -- No  Machine plugged into wall outlet (NOT bed outlet) -- Yes  Dressing Status Intact Intact  Drainage Amount None None     No Linked orders to display     Pressure Injury 10/15/20 Heel Right Deep Tissue Pressure Injury - Purple or maroon localized area of discolored intact skin or blood-filled blister due to damage of underlying soft tissue from pressure and/or shear. (Active)  Date First Assessed/Time First Assessed: 10/15/20 0323   Location: Heel  Location Orientation: Right  Staging: Deep Tissue Pressure Injury - Purple or maroon localized area of discolored intact skin or blood-filled blister due to damage of underlying ...    Assessments 10/15/2020  3:00 AM 10/23/2020  8:25 PM  Dressing Type Impregnated gauze (bismuth) Foam - Lift dressing to assess site every shift  Dressing -- Clean;Dry;Intact  Dressing Change Frequency -- PRN  Site / Wound Assessment Clean;Dry Dry;Clean  Peri-wound Assessment -- Intact  Drainage Amount None None  Treatment -- Off loading     No Linked orders to display     Other problems: Metastatic small cell lung carcinoma status post chemotherapy with lurbinectedin last cycle 09/12/20. -Lung, liver -Follows with Dr. Delton Coombes.  -CT head; No acute intracranial pathology   GI, Diarrhea;  Resolved after stopping laxative and change in his tube feed formula.  Rhabdomyolysis.   Resolved with IV fluids.  Data Reviewed: Basic Metabolic Panel: Recent Labs  Lab 10/17/20 0855 10/18/20 0447 10/18/20 0733 10/19/20 0544 10/20/20 0206  NA 138 139  --  137 134*  K 3.9 3.3*  --  3.7 3.7  CL 106 107  --  104 102  CO2 25 28  --  28 25  GLUCOSE 138* 114*  --  124* 120*  BUN  25* 30*  --  22 20  CREATININE 0.71 0.75  --  0.68 0.66  CALCIUM 8.1* 8.3*  --  8.3* 8.6*  MG  --  2.3  --   --   --   PHOS  --   --  2.3*  --   --    Liver Function Tests: No results for input(s): AST, ALT, ALKPHOS, BILITOT, PROT, ALBUMIN in the last 168 hours. No results for input(s): LIPASE, AMYLASE in the last 168 hours. No results for input(s): AMMONIA in the last 168 hours. CBC: Recent Labs  Lab 10/17/20 0857 10/18/20 0447 10/19/20 1145 10/20/20 0206 10/23/20 0435  WBC 7.0 6.6 6.2 6.8 11.1*  HGB 6.5* 7.4* 9.1* 9.5* 9.6*  HCT 19.2* 21.8* 27.6* 28.1* 29.0*  MCV 93.2 93.2 93.6 90.9 94.2  PLT 84* 104* 146* 155 270   Cardiac Enzymes: No results for input(s): CKTOTAL, CKMB, CKMBINDEX, TROPONINI in the last 168 hours. BNP (last 3 results) No results for input(s): BNP in the last 8760 hours.  ProBNP (last 3 results) No results for input(s): PROBNP in the last 8760 hours.  CBG: Recent Labs  Lab 10/23/20 1226 10/23/20 1535 10/23/20 2031 10/24/20 0046 10/24/20 0414  GLUCAP 139* 96 121* 104* 131*    Recent Results (from the past 240 hour(s))  Culture, Respiratory w Gram Stain     Status: None   Collection Time: 10/15/20  4:26 AM   Specimen: Tracheal Aspirate; Respiratory  Result Value Ref Range Status   Specimen Description TRACHEAL ASPIRATE  Final   Special Requests NONE  Final   Gram Stain   Final    NO WBC SEEN RARE BUDDING YEAST SEEN Performed at [facility], [address], [place], [place] [address]    Culture FEW CANDIDA KRUSEI  Final   Report Status 10/17/2020 FINAL  Final  MRSA PCR Screening     Status: None   Collection Time: 10/15/20  9:45 AM   Specimen: Nasopharyngeal  Result Value Ref Range Status   MRSA by PCR NEGATIVE NEGATIVE Final    Comment:        The GeneXpert MRSA Assay (FDA approved for NASAL specimens only), is one component of a comprehensive MRSA colonization surveillance program. It is not intended to diagnose  MRSA infection nor to guide or monitor treatment for MRSA infections. Performed at [facility], [place] [address], [place], [place] [address]      Studies: DG CHEST PORT 1 VIEW  Result Date: 10/23/2020 CLINICAL DATA:  Cough. EXAM: PORTABLE CHEST 1 VIEW COMPARISON:  10/16/2020 FINDINGS: Stable normal sized heart. Airspace opacity is again demonstrated in the left lower lobe with mild improvement. The remainder of the lungs are clear and hyperexpanded with mild chronic prominence of the interstitial markings. Right jugular porta catheter tip at the superior cavoatrial junction. Feeding tube extending into the stomach. Unremarkable bones. IMPRESSION: 1. Mildly improved left lower lobe pneumonia. 2. Mild changes of COPD. Electronically Signed   By: Claudie Revering M.D.   On: 10/23/2020 08:37   DG Swallowing Func-Speech Pathology  Result Date: 10/22/2020 Objective Swallowing Evaluation: Type of Study: MBS-Modified Barium Swallow Study  Patient Details Name: [name] MRN: [id number] Date of Birth: Oct 31, 1950 Today's Date: 10/22/2020 Time: SLP Start Time (ACUTE ONLY): 1440 -SLP Stop Time (ACUTE ONLY): 1455 SLP Time Calculation (min) (ACUTE ONLY): 15 min Past Medical History: Past Medical History: Diagnosis Date . Anxiety  . CAD (coronary artery disease)  . Cancer ([place])   stage 4 small cell lung cancer  . COPD (chronic obstructive pulmonary disease) ([place])  . Depression  . Dyspnea   increased exertion . Feeling of chest tightness  . Heart palpitations  . History of chemotherapy  . Myocardial infarction ([place])  . Osteopenia  . Panic attacks  . Smoker  Past Surgical History: Past Surgical History: Procedure Laterality Date . BACK SURGERY  12/24/2000  L5,S1 . CORONARY STENT PLACEMENT  2005  RCA & CX . HERNIA REPAIR Right 1980's . INGUINAL HERNIA REPAIR  12/1978  right side . IR FLUORO GUIDE PORT INSERTION RIGHT  04/02/2017 . IR US GUIDE BX ASP/DRAIN  02/03/2017 . IR US GUIDE VASC ACCESS RIGHT  04/02/2017 . NM  MYOCAR PERF WALL MOTION  09/07/2009  No ischemia; EF 51% . SHOULDER SURGERY Left 08/2010 . SPINE SURGERY  2002  L5-S1 . TOTAL HIP ARTHROPLASTY Left 10/12/2020  Procedure: TOTALhemi  HIP ARTHROPLASTY ANTERIOR APPROACH;  Surgeon: [name], MD;  Location: [facility];  Service: Orthopedics;  Laterality: Left; HPI: Pt is a 70 yo male admitted 3/30 with AMS after fall with unknown downtime (1-3 days  suspected). Pt was found to have L hip fx, L L3 transverse process fx, L frontal sinus fx, and AKI with rhabdomyolysis. On 3/31 pt had worsening hypoxia thought to be related to an aspiration event, requiring ETT 3/31-4/2; another suspected aspiration event on requiring re-intubation 4/4-4/6. 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 Pt was reporting intermittent difficulty swallowing solids in 2019 per cancer center notes.  Subjective: Alert, but pleasantly confused Assessment / Plan / Recommendation CHL IP CLINICAL IMPRESSIONS 10/22/2020 Clinical Impression Pt presents with oropharyngeal dysphagia characterized by reduced bolus cohesion, a pharyngeal delay, reduced lingual retraction, and reduced anterior laryngeal movement. He demonstrated premature spillage to the valleculae with inconsistent spillover to the pyriform sinuses, base of tongue residue, vallecular residue, pyriform sinus residue, penetration (PAS 3, 5) and inconsistent aspiration (PAS 7) of thin and nectar thick iquids. Aspiration consistently triggered a spontaneous cough which was inconsistently effective in expelling the aspirate. Penetration was improved and aspiration eliminated with reduced bolus sizes of nectar thick and thin liquids via cup; however, pt was unable to consistently reduce bolus size and eliminate use of a posterior head tilt despite cues and prompts. Pharyngeal residue was reduced with use of a liquid wash and pt was able to swallow  a barium tablet with honey thick liquids via cup. A dysphagia 2 diet with honey thick liquids will be initiated at this time. However, there is potential for advancement of liquids once pt is able to demonstrate use of swallowing precautions. SLP will follow for treatment. SLP Visit Diagnosis Dysphagia, oropharyngeal phase (R13.12) Attention and concentration deficit following -- Frontal lobe and executive function deficit following -- Impact on safety and function Mild aspiration risk;Moderate aspiration risk   CHL IP TREATMENT RECOMMENDATION 10/22/2020 Treatment Recommendations Therapy as outlined in treatment plan below;F/U MBS in --- days (Comment)   Prognosis 10/22/2020 Prognosis for Safe Diet Advancement Good Barriers to Reach Goals Cognitive deficits Barriers/Prognosis Comment -- CHL IP DIET RECOMMENDATION 10/22/2020 SLP Diet Recommendations Dysphagia 2 (Fine chop) solids;Honey thick liquids Liquid Administration via Cup;Straw Medication Administration Whole meds with puree Compensations Slow rate;Small sips/bites;Minimize environmental distractions;Follow solids with liquid Postural Changes Remain semi-upright after after feeds/meals (Comment);Seated upright at 90 degrees   CHL IP OTHER RECOMMENDATIONS 10/22/2020 Recommended Consults -- Oral Care Recommendations Oral care BID Other Recommendations Order thickener from pharmacy   CHL IP FOLLOW UP RECOMMENDATIONS 10/22/2020 Follow up Recommendations Skilled Nursing facility   [facility] IP FREQUENCY AND DURATION 10/22/2020 Speech Therapy Frequency (ACUTE ONLY) min 2x/week Treatment Duration 2 weeks      CHL IP ORAL PHASE 10/22/2020 Oral Phase Impaired Oral - Pudding Teaspoon -- Oral - Pudding Cup -- Oral - Honey Teaspoon -- Oral - Honey Cup -- Oral - Nectar Teaspoon -- Oral - Nectar Cup Decreased bolus cohesion;Premature spillage Oral - Nectar Straw Decreased bolus cohesion;Premature spillage Oral - Thin Teaspoon -- Oral - Thin Cup Decreased bolus cohesion;Premature  spillage Oral - Thin Straw Decreased bolus cohesion;Premature spillage Oral - Puree WFL Oral - Mech Soft -- Oral - Regular Impaired mastication Oral - Multi-Consistency -- Oral - Pill WFL Oral Phase - Comment --  CHL IP PHARYNGEAL PHASE 10/22/2020 Pharyngeal Phase Impaired Pharyngeal- Pudding Teaspoon -- Pharyngeal -- Pharyngeal- Pudding Cup -- Pharyngeal -- Pharyngeal- Honey Teaspoon -- Pharyngeal -- Pharyngeal- Honey Cup -- Pharyngeal -- Pharyngeal- Nectar Teaspoon -- Pharyngeal -- Pharyngeal- Nectar Cup Trace aspiration;Pharyngeal residue - valleculae;Pharyngeal residue - pyriform;Reduced tongue  base retraction;Penetration/Aspiration during swallow;Penetration/Apiration after swallow;Reduced anterior laryngeal mobility;Delayed swallow initiation-vallecula;Delayed swallow initiation-pyriform sinuses Pharyngeal Material enters airway, remains ABOVE vocal cords and not ejected out;Material enters airway, passes BELOW cords and not ejected out despite cough attempt by patient Pharyngeal- Nectar Straw Trace aspiration;Pharyngeal residue - valleculae;Pharyngeal residue - pyriform;Reduced tongue base retraction;Penetration/Aspiration during swallow;Penetration/Apiration after swallow;Reduced anterior laryngeal mobility;Delayed swallow initiation-vallecula;Delayed swallow initiation-pyriform sinuses Pharyngeal Material enters airway, CONTACTS cords and not ejected out;Material enters airway, passes BELOW cords and not ejected out despite cough attempt by patient Pharyngeal- Thin Teaspoon -- Pharyngeal -- Pharyngeal- Thin Cup Trace aspiration;Pharyngeal residue - valleculae;Pharyngeal residue - pyriform;Reduced tongue base retraction;Penetration/Aspiration during swallow;Penetration/Apiration after swallow;Reduced anterior laryngeal mobility;Delayed swallow initiation-vallecula;Delayed swallow initiation-pyriform sinuses Pharyngeal Material enters airway, passes BELOW cords and not ejected out despite cough attempt by  patient;Material enters airway, remains ABOVE vocal cords and not ejected out Pharyngeal- Thin Straw Trace aspiration;Pharyngeal residue - valleculae;Pharyngeal residue - pyriform;Reduced tongue base retraction;Penetration/Aspiration during swallow;Penetration/Apiration after swallow;Reduced anterior laryngeal mobility;Delayed swallow initiation-vallecula;Delayed swallow initiation-pyriform sinuses Pharyngeal Material enters airway, CONTACTS cords and not ejected out;Material enters airway, passes BELOW cords and not ejected out despite cough attempt by patient Pharyngeal- Puree Pharyngeal residue - valleculae;Pharyngeal residue - pyriform;Reduced tongue base retraction;Reduced anterior laryngeal mobility;Delayed swallow initiation-vallecula;Delayed swallow initiation-pyriform sinuses Pharyngeal -- Pharyngeal- Mechanical Soft -- Pharyngeal -- Pharyngeal- Regular -- Pharyngeal -- Pharyngeal- Multi-consistency -- Pharyngeal -- Pharyngeal- Pill Pharyngeal residue - valleculae;Pharyngeal residue - pyriform;Reduced tongue base retraction;Reduced anterior laryngeal mobility;Delayed swallow initiation-vallecula;Delayed swallow initiation-pyriform sinuses Pharyngeal -- Pharyngeal Comment --  CHL IP CERVICAL ESOPHAGEAL PHASE 10/22/2020 Cervical Esophageal Phase WFL Pudding Teaspoon -- Pudding Cup -- Honey Teaspoon -- Honey Cup -- Nectar Teaspoon -- Nectar Cup -- Nectar Straw -- Thin Teaspoon -- Thin Cup -- Thin Straw -- Puree -- Mechanical Soft -- Regular -- Multi-consistency -- Pill -- Cervical Esophageal Comment -- Shanika I. [facility] Negus, [place], [place] Office number [phone] Pager [phone] Horton Marshall 10/22/2020, 4:50 PM               Scheduled Meds: . arformoterol  15 mcg Nebulization BID  . aspirin  81 mg Per Tube Daily  . chlorhexidine gluconate (MEDLINE KIT)  15 mL Mouth Rinse BID  . Chlorhexidine Gluconate Cloth  6 each Topical Daily  . doxazosin  2 mg Per Tube Daily  .  famotidine  20 mg Per Tube BID  . feeding supplement (PROSource TF)  45 mL Per Tube BID  . free water  150 mL Per Tube Q4H  . gabapentin  100 mg Per Tube Q8H  . heparin injection (subcutaneous)  5,000 Units Subcutaneous Q8H  . hydrALAZINE  10 mg Intravenous Once  . insulin aspart  0-9 Units Subcutaneous Q4H  . mouth rinse  15 mL Mouth Rinse q12n4p  . melatonin  5 mg Oral QHS  . mirtazapine  30 mg Per Tube QHS  . nicotine  14 mg Transdermal Daily  . revefenacin  175 mcg Nebulization Daily  . sodium chloride flush  10-40 mL Intracatheter Q12H   Continuous Infusions: . sodium chloride 10 mL/hr at 10/19/20 1500  . ampicillin-sulbactam (UNASYN) IV 3 g (10/24/20 0039)  . feeding supplement (JEVITY 1.5 CAL/FIBER) 1,000 mL (10/22/20 0934)    Active Problems:   Small cell lung cancer in adult ([place])   Pressure injury of skin   Closed fracture of left hip ([facility])   Protein-calorie malnutrition, severe   Status post total replacement of left hip   Non-traumatic rhabdomyolysis  Fall   Left-sided weakness   Acute respiratory failure with hypoxia ([place])   Consultants:  Neurosurgery  Ortho  CCM  Palliative care   Procedures:  ECHO  Total hip arthroplasty  Antibiotics: Anti-infectives (From admission, onward)   Start     Dose/Rate Route Frequency Ordered Stop   10/23/20 1330  Ampicillin-Sulbactam (UNASYN) 3 g in sodium chloride 0.9 % 100 mL IVPB        3 g 200 mL/hr over 30 Minutes Intravenous Every 6 hours 10/23/20 1241     10/15/20 2200  ceFEPIme (MAXIPIME) 2 g in sodium chloride 0.9 % 100 mL IVPB        2 g 200 mL/hr over 30 Minutes Intravenous Every 12 hours 10/15/20 1121 10/16/20 2332   10/15/20 0400  vancomycin (VANCOREADY) IVPB 1500 mg/300 mL  Status:  Discontinued        1,500 mg 150 mL/hr over 120 Minutes Intravenous Every 24 hours 10/15/20 0300 10/15/20 1324   10/12/20 1254  vancomycin (VANCOCIN) powder  Status:  Discontinued          As needed 10/12/20 1255  10/12/20 1328   10/11/20 2100  vancomycin (VANCOREADY) IVPB 1250 mg/250 mL  Status:  Discontinued        1,250 mg 166.7 mL/hr over 90 Minutes Intravenous Every 24 hours 10/10/20 2104 10/12/20 0834   10/11/20 1800  ceFEPIme (MAXIPIME) 2 g in sodium chloride 0.9 % 100 mL IVPB  Status:  Discontinued        2 g 200 mL/hr over 30 Minutes Intravenous Every 8 hours 10/11/20 1230 10/15/20 1121   10/11/20 1200  ceFAZolin (ANCEF) IVPB 2g/100 mL premix        2 g 200 mL/hr over 30 Minutes Intravenous On call to O.R. 10/11/20 1114 10/12/20 0559   10/10/20 2100  vancomycin (VANCOREADY) IVPB 1250 mg/250 mL        1,250 mg 166.7 mL/hr over 90 Minutes Intravenous  Once 10/10/20 2058 10/10/20 2327   10/10/20 2100  ceFEPIme (MAXIPIME) 2 g in sodium chloride 0.9 % 100 mL IVPB  Status:  Discontinued        2 g 200 mL/hr over 30 Minutes Intravenous Every 12 hours 10/10/20 2058 10/11/20 1230        Time spent:  35 minutes    Erin Hearing ANP  [facility] 7 am - 330 pm/M-F for direct patient care and secure chat Please refer to Amion for contact info 14  days

## 2020-10-25 ENCOUNTER — Inpatient Hospital Stay (HOSPITAL_COMMUNITY): Payer: Medicare HMO

## 2020-10-25 DIAGNOSIS — S72002A Fracture of unspecified part of neck of left femur, initial encounter for closed fracture: Secondary | ICD-10-CM | POA: Diagnosis not present

## 2020-10-25 DIAGNOSIS — J9601 Acute respiratory failure with hypoxia: Secondary | ICD-10-CM | POA: Diagnosis not present

## 2020-10-25 DIAGNOSIS — L89159 Pressure ulcer of sacral region, unspecified stage: Secondary | ICD-10-CM | POA: Diagnosis present

## 2020-10-25 DIAGNOSIS — R4182 Altered mental status, unspecified: Secondary | ICD-10-CM | POA: Diagnosis not present

## 2020-10-25 DIAGNOSIS — S0219XA Other fracture of base of skull, initial encounter for closed fracture: Secondary | ICD-10-CM | POA: Diagnosis not present

## 2020-10-25 DIAGNOSIS — I951 Orthostatic hypotension: Secondary | ICD-10-CM

## 2020-10-25 LAB — COMPREHENSIVE METABOLIC PANEL
ALT: 40 U/L (ref 0–44)
AST: 21 U/L (ref 15–41)
Albumin: 2.9 g/dL — ABNORMAL LOW (ref 3.5–5.0)
Alkaline Phosphatase: 73 U/L (ref 38–126)
Anion gap: 7 (ref 5–15)
BUN: 23 mg/dL (ref 8–23)
CO2: 26 mmol/L (ref 22–32)
Calcium: 9 mg/dL (ref 8.9–10.3)
Chloride: 103 mmol/L (ref 98–111)
Creatinine, Ser: 0.83 mg/dL (ref 0.61–1.24)
GFR, Estimated: >60 mL/min (ref >60)
Glucose, Bld: 118 mg/dL — ABNORMAL HIGH (ref 70–99)
Potassium: 4.2 mmol/L (ref 3.5–5.1)
Sodium: 136 mmol/L (ref 135–145)
Total Bilirubin: 0.4 mg/dL (ref 0.3–1.2)
Total Protein: 6.1 g/dL — ABNORMAL LOW (ref 6.5–8.1)

## 2020-10-25 LAB — LACTATE DEHYDROGENASE: LDH: 199 U/L — ABNORMAL HIGH (ref 98–192)

## 2020-10-25 LAB — IRON AND TIBC
Iron: 48 ug/dL (ref 45–182)
Saturation Ratios: 19 % (ref 17.9–39.5)
TIBC: 255 ug/dL (ref 250–450)
UIBC: 207 ug/dL

## 2020-10-25 LAB — CBC WITH DIFFERENTIAL/PLATELET
Abs Immature Granulocytes: 0.07 10*3/uL (ref 0.00–0.07)
Basophils Absolute: 0.1 10*3/uL (ref 0.0–0.1)
Basophils Relative: 1 %
Eosinophils Absolute: 0.2 10*3/uL (ref 0.0–0.5)
Eosinophils Relative: 3 %
HCT: 30.9 % — ABNORMAL LOW (ref 39.0–52.0)
Hemoglobin: 10.2 g/dL — ABNORMAL LOW (ref 13.0–17.0)
Immature Granulocytes: 1 %
Lymphocytes Relative: 13 %
Lymphs Abs: 1 10*3/uL (ref 0.7–4.0)
MCH: 31.1 pg (ref 26.0–34.0)
MCHC: 33 g/dL (ref 30.0–36.0)
MCV: 94.2 fL (ref 80.0–100.0)
Monocytes Absolute: 0.6 10*3/uL (ref 0.1–1.0)
Monocytes Relative: 7 %
Neutro Abs: 5.9 10*3/uL (ref 1.7–7.7)
Neutrophils Relative %: 75 %
Platelets: 302 10*3/uL (ref 150–400)
RBC: 3.28 MIL/uL — ABNORMAL LOW (ref 4.22–5.81)
RDW: 18.6 % — ABNORMAL HIGH (ref 11.5–15.5)
WBC: 7.8 10*3/uL (ref 4.0–10.5)
nRBC: 0 % (ref 0.0–0.2)

## 2020-10-25 LAB — GLUCOSE, CAPILLARY
Glucose-Capillary: 119 mg/dL — ABNORMAL HIGH (ref 70–99)
Glucose-Capillary: 164 mg/dL — ABNORMAL HIGH (ref 70–99)
Glucose-Capillary: 132 mg/dL — ABNORMAL HIGH (ref 70–99)
Glucose-Capillary: 145 mg/dL — ABNORMAL HIGH (ref 70–99)
Glucose-Capillary: 125 mg/dL — ABNORMAL HIGH (ref 70–99)
Glucose-Capillary: 109 mg/dL — ABNORMAL HIGH (ref 70–99)
Glucose-Capillary: 137 mg/dL — ABNORMAL HIGH (ref 70–99)

## 2020-10-25 LAB — MAGNESIUM: Magnesium: 2.4 mg/dL (ref 1.7–2.4)

## 2020-10-25 LAB — VITAMIN B12: Vitamin B-12: 677 pg/mL (ref 180–914)

## 2020-10-25 LAB — RETICULOCYTES
Immature Retic Fract: 26.1 % — ABNORMAL HIGH (ref 2.3–15.9)
RBC.: 3.23 MIL/uL — ABNORMAL LOW (ref 4.22–5.81)
Retic Count, Absolute: 112.1 10*3/uL (ref 19.0–186.0)
Retic Ct Pct: 3.5 % — ABNORMAL HIGH (ref 0.4–3.1)

## 2020-10-25 LAB — LACTIC ACID, PLASMA
Lactic Acid, Venous: 1 mmol/L (ref 0.5–1.9)
Lactic Acid, Venous: 1 mmol/L (ref 0.5–1.9)

## 2020-10-25 LAB — FOLATE: Folate: 19.1 ng/mL (ref >5.9)

## 2020-10-25 LAB — PHOSPHORUS: Phosphorus: 4.1 mg/dL (ref 2.5–4.6)

## 2020-10-25 LAB — FERRITIN: Ferritin: 419 ng/mL — ABNORMAL HIGH (ref 24–336)

## 2020-10-25 LAB — PROCALCITONIN: Procalcitonin: <0.1 ng/mL

## 2020-10-25 MED ORDER — ALBUMIN HUMAN 25 % IV SOLN
25.0000 g | Freq: Once | INTRAVENOUS | Status: AC
  Administered 2020-10-25: 25 g via INTRAVENOUS
  Filled 2020-10-25: qty 100

## 2020-10-25 MED ORDER — SODIUM CHLORIDE 0.9 % IV SOLN: INTRAVENOUS | Status: DC

## 2020-10-25 NOTE — Progress Notes (Signed)
Physical Therapy Treatment Patient Details Name: [name] MRN: [id number] DOB: 09/07/1950 Today's Date: 10/25/2020    History of Present Illness Pt is 70 yo admitted 3/30 after fall and found by brother with unknown down time (1-3 days). Fractured L frontal sinus,L L3 transverse process, displaced angulated mildly comminuted L femoral neck fx. AKI with rhabdo. 3/31 pt with hypoxia requiring intubation, extubated 4/1. Pt s/p left anterior hip hemiarthroplasty 4/1. Re-intubated 4/4 for hypoxemia and transfered to ICU, extubated 10/17/20. 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 Performed supine exercises prior to advancing to sit EOB, however patient became orthostatic with dizziness (see vitals flowsheet). Performed seated exercises with further drop in BP and increasing dizziness with return to supine. Pt reported feeling less dizziness and noted BP increased in supine.     Follow Up Recommendations  SNF;Supervision/Assistance - 24 hour     Equipment Recommendations  Rolling walker with 5" wheels;3in1 (PT)    Recommendations for Other Services       Precautions / Restrictions Precautions Precautions: Fall Precaution Comments: Cortrak, rectal tube, O2; orthostasis Restrictions LLE Weight Bearing: Weight bearing as tolerated Other Position/Activity Restrictions: s/p L hemi arthroplasty, anterior approach, no precautions    Mobility  Bed Mobility Overal bed mobility: Needs Assistance Bed Mobility: Sit to Supine;Supine to Sit     Supine to sit: HOB elevated;Min assist Sit to supine: Min guard   General bed mobility comments: min assist to help raise torso; no cues needed to sequence    Transfers Overall transfer level: Needs assistance Equipment used: None Transfers: Lateral/Scoot Transfers          Lateral/Scoot Transfers: Min guard General transfer  comment: unable to attempt standing due to symptomatic orthostasis; able to laterally scoot to his left along EOB with close guarding and cues  Ambulation/Gait                 Stairs             Wheelchair Mobility    Modified Rankin (Stroke Patients Only)       Balance Overall balance assessment: Needs assistance Sitting-balance support: Feet supported;Bilateral upper extremity supported Sitting balance-Leahy Scale: Poor Sitting balance - Comments: minguardA at EOB once feet flat on floor                                    Cognition Arousal/Alertness: Awake/alert Behavior During Therapy: WFL for tasks assessed/performed Overall Cognitive Status: Impaired/Different from baseline Area of Impairment: Memory;Following commands;Safety/judgement;Problem solving                 Orientation Level:  (NT; did not initially recall having hip surgery)   Memory: Decreased short-term memory Following Commands: Follows one step commands consistently;Follows multi-step commands inconsistently Safety/Judgement: Decreased awareness of safety;Decreased awareness of deficits   Problem Solving: Slow processing;Requires tactile cues;Requires verbal cues General Comments: Admits he was pulling his tubes/wires yesterday when up in chair      Exercises General Exercises - Lower Extremity Ankle Circles/Pumps: AROM;Both;10 reps Long Arc Quad: AROM;Seated;Both;10 reps Heel Slides: AROM;Left;10 reps;AAROM;Right Hip ABduction/ADduction: AAROM;Left;10 reps;AROM;Right    General Comments General comments (skin integrity, edema, etc.): Used exercises at EOB to try to bring BP up without success (palm pressess/counterpressure), LAQ      Pertinent Vitals/Pain Pain  Assessment: No/denies pain    Home Living                      Prior Function            PT Goals (current goals can now be found in the care plan section) Acute Rehab PT Goals Patient  Stated Goal: to go home Time For Goal Achievement: 10/27/20 Potential to Achieve Goals: Fair Progress towards PT goals: Progressing toward goals    Frequency    Min 3X/week      PT Plan Current plan remains appropriate    Co-evaluation              AM-PAC PT "6 Clicks" Mobility   Outcome Measure  Help needed turning from your back to your side while in a flat bed without using bedrails?: A Little Help needed moving from lying on your back to sitting on the side of a flat bed without using bedrails?: A Little Help needed moving to and from a bed to a chair (including a wheelchair)?: Total Help needed standing up from a chair using your arms (e.g., wheelchair or bedside chair)?: A Lot Help needed to walk in hospital room?: Total Help needed climbing 3-5 steps with a railing? : Total 6 Click Score: 11    End of Session   Activity Tolerance: Treatment limited secondary to medical complications (Comment) (orthostasis) Patient left: in bed;with call bell/phone within reach;with bed alarm set (bil wrist restraints (therefore returned to bed)) Nurse Communication: Mobility status;Other (comment) (orthostasis; try to elevate to chair position throughout the day) PT Visit Diagnosis: Other abnormalities of gait and mobility (R26.89);Difficulty in walking, not elsewhere classified (R26.2);Muscle weakness (generalized) (M62.81)     Time: 0349-1791 PT Time Calculation (min) (ACUTE ONLY): 25 min  Charges:  $Therapeutic Exercise: 8-22 mins $Therapeutic Activity: 8-22 mins                      [name], PT Pager [phone]    [name] 10/25/2020, 10:24 AM

## 2020-10-25 NOTE — Progress Notes (Signed)
PROGRESS NOTE    [name]  BPZ:[id number] DOB: 1951-03-25 DOA: 10/10/2020 PCP: [name], MD     Brief Narrative:  70 year old WM PMHx Tobacco abuse, CAD, COPD, small cell carcinoma mets to lungs, liver   Admitted to [facility] on 3/30 for encephalopathy following a fall with associated facial fractures.  Patient was found by brother on 3/30 after he had been unable to reach patient for unknown period of time (1 to 3 days suspected).  Patient fracture left frontal sinus, L3 transverse process, displaced angulated mildly comminuted left femoral neck fracture.  AKI with rhabdomyolysis.  On 3/31 patient develop worsening hypoxia and respiratory distress, intubated.  Subsequently extubated 4/2 and transferred out of the ICU on 4/3.  The evening of 4/3--4/4 he developed increased work of breathing and increased oxygen needs requiring intubation.  This was thought to be related to an aspiration event.   Subjective: 4/14 afebrile overnight, A/O x4, resting comfortably.  States did feel lightheaded when he was ambulating around.   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 [facility])   Pressure injury of skin   Closed fracture of left hip ([place])   Protein-calorie malnutrition, severe   Status post total replacement of left hip   Non-traumatic rhabdomyolysis   Fall   Left-sided weakness   Acute respiratory failure with hypoxia (HCC)   Oropharyngeal dysphagia   Sacral pressure ulcer   Acute respiratory failure with hypoxia/Aspiration pneumonia/Emphysema  -4/5 Mucus Plug -4/14 PCXR pending: Although patient does not feel SOB SPO2 drops with 4 L nasal cannula on.  Most likely secondary to patient being a mouth breather and having NG tube in place.   - Place patient on Ventimask when sleeping: Mouth breather  Hx Small cell lung cancer  -S/p chemotherapy Lurbinectedin last cycle 09/12/20. -Extubated 4\6 -Completed 7 days  of cefepime 4/05. -Brovana/Yupelri nebulizer treatments and PRN albuterol.  -received Solumedrol on 4/05 during mucus plug event, prednisone for COPD.  -Patient  To be discharge on Incruse and Dulera. On nebulizer while inpatient.  -Chest PT to see if he can bring up his secretions. -Complete 5-day course Unasyn due to worsening cough and leukocytosis. -Incentive spirometry and flutter well. - 4/14 trend procalcitonin/lactic acid  Metastatic small cell carcinoma -Lung, liver -Follows with Dr. Delton Coombes.  -CT head; No acute intracranial pathology   Orthostatic hypotension - 4/14 albumin 25 g - 4/14 TED hose 12 hours on/12 hours off - 4/14 normal saline 50 ml/hr  Nausea/vomiting -4/14 resolved   Nutrition;  -On tube feeding.  Through core track. -Swallow evaluation was attempted again today but patient was keeping pured diet and his mouth, current recommendations remained to maintain n.p.o. and continue feeding through tube - If patient needs long-term tube feeding-we will order PEG tube with IR. -Speech placed him on dysphagia 2 diet with thick liquid after getting a barium swallow, please see the full report-remain high risk for aspiration.  GI, Diarrhea;  -resolved after stopping laxative and change in his tube feed formula.  Acute metabolic encephalopathy: Delirium.   -Likely in the setting of acute respiratory failure, due to aspiration pneumonia and ICU delirium. -Continue Gabapentin,dose was reduced to 100 mg 3 times daily from 300 mg. -4/14 resolved answered all questions appropriately.   Left femoral Neck Fx S/P Hemiarthroplasty:  -Ortho following.  -PT following-recommending SNF placement.  Left Facial Fx, L 3 TP Fx:  -Neurosurgery  evaluated , no  surgical intervention needed.  -Neurosurgery recommend ENT consult. -Spoke with ENT on called for [place] recommend Conservative management, Fracture is non displaced, does not need anything acute.   Rhabdomyolysis.    -Resolved with IV fluids.   Thrombocytopenia,  -Thrombocytopenia resolved.    Normocytic Anemia  -4/6 transfuse 1 unit PRBC Lab Results  Component Value Date   HGB 10.2 (L) 10/25/2020   HGB 9.8 (L) 10/24/2020   HGB 9.6 (L) 10/23/2020   HGB 9.5 (L) 10/20/2020   HGB 9.1 (L) 10/19/2020  -4/13 anemia consistent with normocytic anemia -4/13 occult blood pending  Tobacco abuse -Needs counseling.    Sepsis was ruled out.   Sacral pressure ulcer Pressure Injury 10/11/20 Sacrum Medial Deep Tissue Pressure Injury - Purple or maroon localized area of discolored intact skin or blood-filled blister due to damage of underlying soft tissue from pressure and/or shear. (Active)  10/11/20 0220  Location: Sacrum  Location Orientation: Medial  Staging: Deep Tissue Pressure Injury - Purple or maroon localized area of discolored intact skin or blood-filled blister due to damage of underlying soft tissue from pressure and/or shear.  Wound Description (Comments):   Present on Admission: Yes     Pressure Injury 10/15/20 Heel Right Deep Tissue Pressure Injury - Purple or maroon localized area of discolored intact skin or blood-filled blister due to damage of underlying soft tissue from pressure and/or shear. (Active)  10/15/20 0323  Location: Heel  Location Orientation: Right  Staging: Deep Tissue Pressure Injury - Purple or maroon localized area of discolored intact skin or blood-filled blister due to damage of underlying soft tissue from pressure and/or shear.  Wound Description (Comments):   Present on Admission: No   Severe protein calorie malnutrition - Continue tube feeds   DVT prophylaxis: Subcu heparin Code Status: Full Family Communication:  Status is: Inpatient    Dispo: The patient is from: Home              Anticipated d/c is to: SNF              Anticipated d/c date is: 4/21              Patient currently unstable      Consultants:  ENT   Procedures/Significant  Events:  3/30 admitted to [facility] for encephalopathy. Facial fx, L3 TP fx, Hip fx.  -3/31 non op from [place] standpoint. Ortho planned to OR 3/31 however acute hypoxic respiratory failure, transferred to ICU and emergently intubated.  -3/31 ETT >>4/1 -3/31 bronchoscopy with BAL left lower lobe -4/1 LT Hip Hemiarthroplasty (Anterior Approach) -4/4 ETT>>4/6 Currently stable on room air.  PT is recommending SNF. Palliative care was also consulted-patient will remain full code with full scope of medical care.  -4/12.. Patient with worsening cough and leukocytosis, started on Unasyn for concern of aspiration.   I have personally reviewed and interpreted all radiology studies and my findings are as above.  VENTILATOR SETTINGS: Room air 4/14 SPO2 94%   Cultures 4/4 tracheal aspirate positive rare Candida Krusei  4/4 MRSA by PCR negative   Antimicrobials: Anti-infectives (From admission, onward)   Start     Ordered Stop   10/23/20 1330  Ampicillin-Sulbactam (UNASYN) 3 g in sodium chloride 0.9 % 100 mL IVPB        10/23/20 1241     10/15/20 2200  ceFEPIme (MAXIPIME) 2 g in sodium chloride 0.9 % 100 mL IVPB        10/15/20 1121 10/16/20 2332  10/15/20 0400  vancomycin (VANCOREADY) IVPB 1500 mg/300 mL  Status:  Discontinued        10/15/20 0300 10/15/20 1324   10/12/20 1254  vancomycin (VANCOCIN) powder  Status:  Discontinued        10/12/20 1255 10/12/20 1328   10/11/20 2100  vancomycin (VANCOREADY) IVPB 1250 mg/250 mL  Status:  Discontinued        10/10/20 2104 10/12/20 0834   10/11/20 1800  ceFEPIme (MAXIPIME) 2 g in sodium chloride 0.9 % 100 mL IVPB  Status:  Discontinued        10/11/20 1230 10/15/20 1121   10/11/20 1200  ceFAZolin (ANCEF) IVPB 2g/100 mL premix        10/11/20 1114 10/12/20 0559   10/10/20 2100  vancomycin (VANCOREADY) IVPB 1250 mg/250 mL        10/10/20 2058 10/10/20 2327   10/10/20 2100  ceFEPIme (MAXIPIME) 2 g in sodium chloride 0.9 % 100 mL IVPB  Status:   Discontinued        10/10/20 2058 10/11/20 1230       Devices    LINES / TUBES:      Continuous Infusions: . sodium chloride 10 mL/hr at 10/19/20 1500  . sodium chloride 75 mL/hr at 10/25/20 1316  . ampicillin-sulbactam (UNASYN) IV 3 g (10/25/20 1309)  . feeding supplement (JEVITY 1.5 CAL/FIBER) 1,000 mL (10/22/20 0934)     Objective: Vitals:   10/25/20 1150 10/25/20 1542 10/25/20 1622 10/25/20 1633  BP: (!) 134/46 (!) 143/45    Pulse:  86    Resp:  17    Temp:  98.3 F (36.8 C)    TempSrc:  Oral    SpO2:  92% (!) 86% 91%  Weight:      Height:        Intake/Output Summary (Last 24 hours) at 10/25/2020 1657 Last data filed at 10/25/2020 1635 Gross per 24 hour  Intake 3010 ml  Output 3050 ml  Net -40 ml   Filed Weights   10/23/20 0500 10/24/20 0420 10/25/20 0500  Weight: 62.6 kg 60.9 kg 62.1 kg    Examination:  General: A/O x4, positive acute respiratory distress, cachectic Eyes: negative scleral hemorrhage, negative anisocoria, negative icterus ENT: Negative Runny nose, negative gingival bleeding, Neck:  Negative scars, masses, torticollis, lymphadenopathy, JVD Lungs: Clear to auscultation bilaterally without wheezes or crackles Cardiovascular: Regular rate and rhythm without murmur gallop or rub normal S1 and S2 Abdomen: negative abdominal pain, nondistended, positive soft, bowel sounds, no rebound, no ascites, no appreciable mass Extremities: No significant cyanosis, clubbing, or edema bilateral lower extremities Skin: Multiple facial bruises Psychiatric:  Negative depression, negative anxiety, negative fatigue, negative mania  Central nervous system:  Cranial nerves II through XII intact, tongue/uvula midline, all extremities muscle strength 5/5, sensation intact throughout, negative dysarthria, negative expressive aphasia, negative receptive aphasia.  .     Data Reviewed: Care during the described time interval was provided by me .  I have reviewed  this patient's available data, including medical history, events of note, physical examination, and all test results as part of my evaluation.  CBC: Recent Labs  Lab 10/19/20 1145 10/20/20 0206 10/23/20 0435 10/24/20 0904 10/25/20 0149  WBC 6.2 6.8 11.1* 7.1 7.8  NEUTROABS  --   --   --  5.2 5.9  HGB 9.1* 9.5* 9.6* 9.8* 10.2*  HCT 27.6* 28.1* 29.0* 29.9* 30.9*  MCV 93.6 90.9 94.2 95.5 94.2  PLT 146* 155 270 295 302  Basic Metabolic Panel: Recent Labs  Lab 10/19/20 0544 10/20/20 0206 10/24/20 0904 10/25/20 0149  NA 137 134* 134* 136  K 3.7 3.7 4.3 4.2  CL 104 102 104 103  CO2 _0 GLUCOSE 124* 120* 99 118*  BUN _1 CREATININE 0.68 0.66 0.86 0.83  CALCIUM 8.3* 8.6* 8.5* 9.0  MG  --   --  2.2 2.4  PHOS  --   --  4.2 4.1   GFR: Estimated Creatinine Clearance: 73.8 mL/min (by C-G formula based on SCr of 0.83 mg/dL). Liver Function Tests: Recent Labs  Lab 10/24/20 0904 10/25/20 0149  AST 23 21  ALT 44 40  ALKPHOS 72 73  BILITOT 0.8 0.4  PROT 5.8* 6.1*  ALBUMIN 2.7* 2.9*   No results for input(s): LIPASE, AMYLASE in the last 168 hours. No results for input(s): AMMONIA in the last 168 hours. Coagulation Profile: No results for input(s): INR, PROTIME in the last 168 hours. Cardiac Enzymes: No results for input(s): CKTOTAL, CKMB, CKMBINDEX, TROPONINI in the last 168 hours. BNP (last 3 results) No results for input(s): PROBNP in the last 8760 hours. HbA1C: No results for input(s): HGBA1C in the last 72 hours. CBG: Recent Labs  Lab 10/25/20 0021 10/25/20 0337 10/25/20 0735 10/25/20 1133 10/25/20 1551  GLUCAP 119* 164* 132* 145* 125*   Lipid Profile: No results for input(s): CHOL, HDL, LDLCALC, TRIG, CHOLHDL, LDLDIRECT in the last 72 hours. Thyroid Function Tests: No results for input(s): TSH, T4TOTAL, FREET4, T3FREE, THYROIDAB in the last 72 hours. Anemia Panel: Recent Labs    10/25/20 0149  VITAMINB12 677  FOLATE 19.1  FERRITIN  419*  TIBC 255  IRON 48  RETICCTPCT 3.5*   Sepsis Labs: Recent Labs  Lab 10/23/20 1245  PROCALCITON <0.10    No results found for this or any previous visit (from the past 240 hour(s)).       Radiology Studies: No results found.      Scheduled Meds: . arformoterol  15 mcg Nebulization BID  . aspirin  81 mg Per Tube Daily  . chlorhexidine gluconate (MEDLINE KIT)  15 mL Mouth Rinse BID  . Chlorhexidine Gluconate Cloth  6 each Topical Daily  . doxazosin  2 mg Per Tube Daily  . famotidine  20 mg Per Tube BID  . feeding supplement (PROSource TF)  45 mL Per Tube BID  . free water  150 mL Per Tube Q4H  . gabapentin  100 mg Per Tube Q8H  . heparin injection (subcutaneous)  5,000 Units Subcutaneous Q8H  . insulin aspart  0-9 Units Subcutaneous Q4H  . mouth rinse  15 mL Mouth Rinse q12n4p  . melatonin  5 mg Oral QHS  . mirtazapine  30 mg Per Tube QHS  . nicotine  14 mg Transdermal Daily  . revefenacin  175 mcg Nebulization Daily  . sodium chloride flush  10-40 mL Intracatheter Q12H   Continuous Infusions: . sodium chloride 10 mL/hr at 10/19/20 1500  . sodium chloride 75 mL/hr at 10/25/20 1316  . ampicillin-sulbactam (UNASYN) IV 3 g (10/25/20 1309)  . feeding supplement (JEVITY 1.5 CAL/FIBER) 1,000 mL (10/22/20 0934)     LOS: 15 days    Time spent:40 min    Modupe Shampine, Geraldo Docker, MD [facility]   If 7PM-7AM, please contact night-coverage 10/25/2020, 4:57 PM

## 2020-10-25 NOTE — Plan of Care (Signed)
  Problem: Education: Goal: Verbalization of understanding the information provided (i.e., activity precautions, restrictions, etc) will improve Outcome: Progressing   Problem: Activity: Goal: Ability to ambulate and perform ADLs will improve Outcome: Progressing   Problem: Self-Concept: Goal: Ability to maintain and perform role responsibilities to the fullest extent possible will improve Outcome: Progressing   Problem: Pain Management: Goal: Pain level will decrease Outcome: Progressing   Problem: Education: Goal: Knowledge of General Education information will improve Description: Including pain rating scale, medication(s)/side effects and non-pharmacologic comfort measures Outcome: Progressing

## 2020-10-25 NOTE — NC FL2 (Signed)
[place] LEVEL OF CARE SCREENING TOOL     IDENTIFICATION  Patient Name: [name] Birthdate: [birth year] Sex: male Admission Date (Current Location): 10/10/2020  [facility] and [place] Number:  Herbalist and Address:  [facility], [place] [address] [place], [place] [address]      Provider Number: [id number]  Attending Physician Name and Address:  Allie Bossier, MD  Relative Name and Phone Number:  Ervie Mccard- brother - [phone]    Current Level of Care: Hospital Recommended Level of Care: [place] Prior Approval Number:    Date Approved/Denied:   PASRR Number: [id number]  Discharge Plan: SNF    Current Diagnoses: Patient Active Problem List   Diagnosis Date Noted  . Oropharyngeal dysphagia   . Status post total replacement of left hip   . Non-traumatic rhabdomyolysis   . Fall   . Left-sided weakness   . Acute respiratory failure with hypoxia ([place])   . Protein-calorie malnutrition, severe 10/16/2020  . Closed fracture of left hip ([place])   . Pressure injury of skin 10/11/2020  . Sepsis without septic shock ([place])   . Left displaced femoral neck fracture ([place])   . Delirium   . Closed fracture of frontal sinus ([place])   . Closed fracture of transverse process of lumbar vertebra ([place])   . Goals of care, counseling/discussion 12/19/2019  . Iron deficiency anemia 09/12/2019  . 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 Lymphadenopathy of head and neck 01/22/2017  . Osteopenia 03/30/2015  . Clubbing of fingers 04/14/2014  . Hyperglycemia 10/20/2013  . Smoker   . Closed dislocation of acromioclavicular joint 04/11/2010  . BREAST MASS, LEFT 01/20/2008  . ONYCHOMYCOSIS, TOENAILS 10/20/2007  . MUSCLE CRAMPS 10/20/2007  . Hypercholesterolemia 05/28/2007  . CATARACT NOS 08/28/2006  . DISTURBANCE, VISUAL NOS 08/19/2006  . COPD (chronic obstructive pulmonary disease) with emphysema ([place])  08/19/2006  . WITHDRAWAL, DRUG 06/24/2006  . ANXIETY 06/24/2006  . DEPRESSION 06/24/2006  . MYOCARDIAL INFARCTION, HX OF 06/24/2006  . Coronary atherosclerosis 06/24/2006  . CARDIAC ARRHYTHMIA 06/24/2006  . CONSTIPATION 06/24/2006  . OSTEOARTHRITIS 06/24/2006  . LOW BACK PAIN 06/24/2006  . INSOMNIA 06/24/2006    Orientation RESPIRATION BLADDER Height & Weight     Self,Time,Situation,Place  Normal Indwelling catheter Weight: 62.1 kg Height:  5' 9"  (175.3 cm)  BEHAVIORAL SYMPTOMS/MOOD NEUROLOGICAL BOWEL NUTRITION STATUS      Continent Diet (See discharge summary - temporary Cortrak until diet advanced)  AMBULATORY STATUS COMMUNICATION OF NEEDS Skin   Extensive Assist Verbally Surgical wounds,PU Stage and Appropriate Care,Wound Vac (sacral pressure wound, Right heel pressure wound)                       Personal Care Assistance Level of Assistance  Bathing,Feeding,Dressing Bathing Assistance: Limited assistance Feeding assistance: Limited assistance Dressing Assistance: Limited assistance     Functional Limitations Info  Sight,Hearing,Speech Sight Info: Adequate Hearing Info: Adequate Speech Info: Adequate    SPECIAL CARE FACTORS FREQUENCY  PT (By licensed PT),OT (By licensed OT)     PT Frequency: 5 x per week OT Frequency: 5 x per week            Contractures Contractures Info: Not present    Additional Factors Info  Code Status,Allergies,Psychotropic,Insulin Sliding Scale Code Status Info: Full code Allergies Info: codeine, niaspan Psychotropic Info: Remeron, Xanex Insulin Sliding Scale Info: See Discharge summary  Current Medications (10/25/2020):  This is the current hospital active medication list Current Facility-Administered Medications  Medication Dose Route Frequency Provider Last Rate Last Admin  . 0.9 %  sodium chloride infusion   Intravenous PRN Freddi Starr, MD 10 mL/hr at 10/19/20 1500 Infusion Verify at 10/19/20 1500  .  acetaminophen (TYLENOL) tablet 650 mg  650 mg Per Tube Q6H PRN Freddi Starr, MD   650 mg at 10/24/20 1257   Or  . acetaminophen (TYLENOL) suppository 650 mg  650 mg Rectal Q6H PRN Freddi Starr, MD      . albuterol (PROVENTIL) (2.5 MG/3ML) 0.083% nebulizer solution 2.5 mg  2.5 mg Nebulization Q2H PRN Rigoberto Noel, MD   2.5 mg at 10/16/20 1343  . ALPRAZolam Duanne Moron) tablet 0.5 mg  0.5 mg Per Tube TID PRN Lorella Nimrod, MD   0.5 mg at 10/25/20 0813  . Ampicillin-Sulbactam (UNASYN) 3 g in sodium chloride 0.9 % 100 mL IVPB  3 g Intravenous Q6H Wendee Beavers, RPH 200 mL/hr at 10/25/20 0752 3 g at 10/25/20 0752  . arformoterol (BROVANA) nebulizer solution 15 mcg  15 mcg Nebulization BID Freddi Starr, MD   15 mcg at 10/25/20 0728  . aspirin chewable tablet 81 mg  81 mg Per Tube Daily Freddi Starr, MD   81 mg at 10/25/20 0941  . chlorhexidine gluconate (MEDLINE KIT) (PERIDEX) 0.12 % solution 15 mL  15 mL Mouth Rinse BID Collier Bullock, MD   15 mL at 10/25/20 0805  . Chlorhexidine Gluconate Cloth 2 % PADS 6 each  6 each Topical Daily Rigoberto Noel, MD   6 each at 10/24/20 [phone]  . doxazosin (CARDURA) tablet 2 mg  2 mg Per Tube Daily Freddi Starr, MD   2 mg at 10/25/20 0941  . famotidine (PEPCID) 40 MG/5ML suspension 20 mg  20 mg Per Tube BID Freddi Starr, MD   20 mg at 10/25/20 0941  . feeding supplement (JEVITY 1.5 CAL/FIBER) liquid 1,000 mL  1,000 mL Per Tube Continuous Regalado, Belkys A, MD 50 mL/hr at 10/22/20 0934 1,000 mL at 10/22/20 0934  . feeding supplement (PROSource TF) liquid 45 mL  45 mL Per Tube BID Freddi Starr, MD   45 mL at 10/25/20 0941  . free water 150 mL  150 mL Per Tube Q4H Regalado, Belkys A, MD   150 mL at 10/25/20 0758  . gabapentin (NEURONTIN) 250 MG/5ML solution 100 mg  100 mg Per Tube Q8H Freddi Starr, MD   100 mg at 10/25/20 0539  . heparin injection 5,000 Units  5,000 Units Subcutaneous Q8H Freddi Starr, MD   5,000 Units at  10/25/20 0538  . insulin aspart (novoLOG) injection 0-9 Units  0-9 Units Subcutaneous Q4H Freddi Starr, MD   1 Units at 10/25/20 0759  . MEDLINE mouth rinse  15 mL Mouth Rinse q12n4p Freddi Starr, MD   15 mL at 10/24/20 1706  . melatonin tablet 5 mg  5 mg Oral QHS Lorella Nimrod, MD   5 mg at 10/24/20 2120  . mirtazapine (REMERON) tablet 30 mg  30 mg Per Tube QHS Freddi Starr, MD   30 mg at 10/24/20 2120  . nicotine (NICODERM CQ - dosed in mg/24 hours) patch 14 mg  14 mg Transdermal Daily Cecilie Lowers T, MD   14 mg at 10/25/20 0941  . ondansetron (ZOFRAN) injection 4 mg  4 mg Intravenous Q8H PRN Ogan,  Kerry Kass, MD      . Resource ThickenUp Clear   Oral PRN Lorella Nimrod, MD      . revefenacin [facility]) nebulizer solution 175 mcg  175 mcg Nebulization Daily Freddi Starr, MD   175 mcg at 10/25/20 0707  . sodium chloride flush (NS) 0.9 % injection 10-40 mL  10-40 mL Intracatheter Q12H Rigoberto Noel, MD   10 mL at 10/24/20 2122  . sodium chloride flush (NS) 0.9 % injection 10-40 mL  10-40 mL Intracatheter PRN Rigoberto Noel, MD         Discharge Medications: Please see discharge summary for a list of discharge medications.  Relevant Imaging Results:  Relevant Lab Results:   Additional Information SS# [id number]  Curlene Labrum, RN

## 2020-10-25 NOTE — Progress Notes (Signed)
  Speech Language Pathology Treatment: Dysphagia  Patient Details Name: [name] MRN: [id number] DOB: 1951/01/17 Today's Date: 10/25/2020 Time: 4801-6553 SLP Time Calculation (min) (ACUTE ONLY): 19.05 min  Assessment / Plan / Recommendation Clinical Impression  Pt was seen for dysphagia treatment. He was alert and cooperative during the session without c/o pain. Pt stated that he has been tolerating the current diet without difficulty, but that he has not been eating much since he has not been hungry. These reports were corroborated by pt's NT. Pt refused solids during this session and he required encouragement to accept liquids. He demonstrated immediate coughing and a wet vocal quality with thin liquids via cup, suggesting aspiration. A delayed cough was noted once with nectar thick liquids via straw, but this consistency was otherwise tolerated well. Pt's diet will be advanced to dysphagia 2 solids and nectar thick liquids via cup. SLP will continue to follow pt.    HPI HPI: Pt is a 70 yo male admitted 3/30 with AMS after fall with unknown downtime (1-3 days suspected). Pt was found to have L hip fx, L L3 transverse process fx, L frontal sinus fx, and AKI with rhabdomyolysis. On 3/31 pt had worsening hypoxia thought to be related to an aspiration event, requiring ETT 3/31-4/2; another suspected aspiration event on requiring re-intubation 4/4-4/6. 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 Pt was reporting intermittent difficulty swallowing solids in 2019 per cancer center notes. Palliative care has been consulted and as of 4/13, family would like full scope of care. MBS 4/11: oropharyngeal dysphagia characterized by reduced bolus cohesion, a pharyngeal delay, reduced lingual retraction, and reduced anterior laryngeal movement. He demonstrated premature spillage to the valleculae with  inconsistent spillover to the pyriform sinuses, base of tongue residue, vallecular residue, pyriform sinus residue, penetration (PAS 3, 5) and inconsistent aspiration (PAS 7) of thin and nectar thick iquids. CXR 4/12: Mildly improved left lower lobe pneumonia. Mild changes of COPD.      SLP Plan  Continue with current plan of care       Recommendations  Diet recommendations: Dysphagia 2 (fine chop);Nectar-thick liquid Liquids provided via: Cup;No straw Medication Administration: Whole meds with puree Supervision: Staff to assist with self feeding;Full supervision/cueing for compensatory strategies Compensations: Slow rate;Small sips/bites;Minimize environmental distractions;Follow solids with liquid Postural Changes and/or Swallow Maneuvers: Seated upright 90 degrees                Oral Care Recommendations: Oral care BID Follow up Recommendations: Skilled Nursing facility SLP Visit Diagnosis: Dysphagia, oropharyngeal phase (R13.12) Plan: Continue with current plan of care       Donnarae Rae I. Hardin Negus, [place], [place] Office number [phone] Pager [place] 10/25/2020, 1:09 PM

## 2020-10-25 NOTE — Progress Notes (Signed)
Patient is no longer pulling out tubes and lines, restraints and flexi seal dc'd this shift. Patient tolerated well.

## 2020-10-25 NOTE — TOC Initial Note (Signed)
Transition of Care [facility]) - Initial/Assessment Note    Patient Details  Name: [name] MRN: [id number] Date of Birth: Jul 23, 1950  [facility]) CM/SW Contact:    Curlene Labrum, RN Phone Number: 10/25/2020, 1:39 PM  Clinical Narrative:                 Case management met with the patient at the bedside regarding transitions of care. The patient lives in his own home and was driving a care prior to hospitalization.  The patient states that he speaks with his brother, Levada Dy, often and his sister lives in the [place].  The patient has a port-a-cath and Cortrak at the present.  The patient understands and is agreeable to SNF placement prior to going home once he is medically stable to discharge from the hospital.  The patient was given choice regarding SNf placement and he does not have a preference for facility at this time.   Patient was agreeable to palliative care services outpatient.  The patient did not have a preference and [facility] would be given a referral for outpatient services.  The patient was receiving cancer treatments through the [place] center prior to hospitalization.  The patient's FL2 was completed and the patient was faxed out in the hub for SNF admission.  The patient has received numerous offers in the hub and choice will be given to the patient regarding admission once the patient is medically stable and cortrak tube is discontinued.  The patient is fully vaccinated for COVID.  CM and MSW will continue to follow for [facility] placement for short term rehab.  Expected Discharge Plan: Skilled Nursing Facility Barriers to Discharge: Continued Medical Work up (Cortrak and tube feedings in place.)   Patient Goals and CMS Choice Patient states their goals for this hospitalization and ongoing recovery are:: Patient wants to feel better and is agreeable to short term rehabilitation before discharging home. CMS Medicare.gov Compare  Post Acute Care list provided to:: Patient Choice offered to / list presented to : Patient  Expected Discharge Plan and Services Expected Discharge Plan: [place] In-house Referral: Clinical Social Work Discharge Planning Services: CM Consult Post Acute Care Choice: [place] Living arrangements for the past 2 months: [place]                                      Prior Living Arrangements/Services Living arrangements for the past 2 months: Single Family Home Lives with:: Self Patient language and need for interpreter reviewed:: Yes Do you feel safe going back to the place where you live?: Yes      Need for Family Participation in Patient Care: Yes (Comment) Care giver support system in place?: Yes (comment)   Criminal Activity/Legal Involvement Pertinent to Current Situation/Hospitalization: No - Comment as needed  Activities of Daily Living Home Assistive Devices/Equipment: None ADL Screening (condition at time of admission) Patient's cognitive ability adequate to safely complete daily activities?: Yes Is the patient deaf or have difficulty hearing?: No Does the patient have difficulty seeing, even when wearing glasses/contacts?: No Does the patient have difficulty concentrating, remembering, or making decisions?: No Patient able to express need for assistance with ADLs?: Yes Does the patient have difficulty dressing or bathing?: No Independently performs ADLs?: Yes (appropriate for developmental age) Does the patient have difficulty walking or climbing stairs?: No Weakness of  Legs: None Weakness of Arms/Hands: None  Permission Sought/Granted Permission sought to share information with : Case Manager,Facility Contact Representative,Family Supports Permission granted to share information with : Yes, Verbal Permission Granted     Permission granted to share info w AGENCY: [place] facilities  Permission granted to share info w  Relationship: family supports listed on [place] (brother) [phone]     Emotional Assessment Appearance:: Appears stated age Attitude/Demeanor/Rapport: Engaged Affect (typically observed): Accepting Orientation: : Oriented to Self,Oriented to  Time,Oriented to Situation Alcohol / Substance Use: Not Applicable Psych Involvement: No (comment)  Admission diagnosis:  Multiple fractures [T07.XXXA] Left-sided weakness [R53.1] Closed fracture of left hip, initial encounter ([place]) [S72.002A] Fall, initial encounter [W19.XXXA] Closed fracture of frontal sinus, initial encounter ([place]) [S02.19XA] Closed fracture of transverse process of lumbar vertebra, initial encounter ([place]) [S32.009A] Non-traumatic rhabdomyolysis [M62.82] Altered mental status, unspecified altered mental status type [R41.82] Patient Active Problem List   Diagnosis Date Noted  . Oropharyngeal dysphagia   . Status post total replacement of left hip   . Non-traumatic rhabdomyolysis   . Fall   . Left-sided weakness   . Acute respiratory failure with hypoxia ([place])   . Protein-calorie malnutrition, severe 10/16/2020  . Closed fracture of left hip ([place])   . Pressure injury of skin 10/11/2020  . Sepsis without septic shock ([place])   . Left displaced femoral neck fracture ([place])   . Delirium   . Closed fracture of frontal sinus ([place])   . Closed fracture of transverse process of lumbar vertebra ([place])   . Goals of care, counseling/discussion 12/19/2019  . Iron deficiency anemia 09/12/2019  . 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 Lymphadenopathy of head and neck 01/22/2017  . Osteopenia 03/30/2015  . Clubbing of fingers 04/14/2014  . Hyperglycemia 10/20/2013  . Smoker   . Closed dislocation of acromioclavicular joint 04/11/2010  . BREAST MASS, LEFT 01/20/2008  . ONYCHOMYCOSIS, TOENAILS 10/20/2007  . MUSCLE CRAMPS 10/20/2007  . Hypercholesterolemia 05/28/2007  . CATARACT NOS 08/28/2006  .  DISTURBANCE, VISUAL NOS 08/19/2006  . COPD (chronic obstructive pulmonary disease) with emphysema ([place]) 08/19/2006  . WITHDRAWAL, DRUG 06/24/2006  . ANXIETY 06/24/2006  . DEPRESSION 06/24/2006  . MYOCARDIAL INFARCTION, HX OF 06/24/2006  . Coronary atherosclerosis 06/24/2006  . CARDIAC ARRHYTHMIA 06/24/2006  . CONSTIPATION 06/24/2006  . OSTEOARTHRITIS 06/24/2006  . LOW BACK PAIN 06/24/2006  . INSOMNIA 06/24/2006   PCP:  [name], MD Pharmacy:   [facility], [place] [address] [place] [address] Phone: [phone] Fax: [facility] [place], [place] [address] Phone: [phone] Fax: [phone]     Social Determinants of Health (SDOH) Interventions    Readmission Risk Interventions No flowsheet data found.

## 2020-10-25 NOTE — Progress Notes (Signed)
Manufacturing engineer [facility])  Hospital Liaison RN note         Notified by [facility] manager of patient/family request for [facility] Palliative services at [facility] after discharge.     [place] Palliative team will follow up with patient after discharge.         Please call with any hospice or palliative related questions.         Thank you for the opportunity to participate in this patient's care.     Domenic Moras, BSN, RN [facility] Liaison (listed on [place] under Hospice/[facility])    [phone] (24h on call)

## 2020-10-26 DIAGNOSIS — R1312 Dysphagia, oropharyngeal phase: Secondary | ICD-10-CM | POA: Diagnosis not present

## 2020-10-26 DIAGNOSIS — E43 Unspecified severe protein-calorie malnutrition: Secondary | ICD-10-CM | POA: Diagnosis not present

## 2020-10-26 DIAGNOSIS — M6282 Rhabdomyolysis: Secondary | ICD-10-CM | POA: Diagnosis not present

## 2020-10-26 DIAGNOSIS — Z96642 Presence of left artificial hip joint: Secondary | ICD-10-CM | POA: Diagnosis not present

## 2020-10-26 LAB — CBC WITH DIFFERENTIAL/PLATELET
Abs Immature Granulocytes: 0.05 10*3/uL (ref 0.00–0.07)
Basophils Absolute: 0 10*3/uL (ref 0.0–0.1)
Basophils Relative: 0 %
Eosinophils Absolute: 0.2 10*3/uL (ref 0.0–0.5)
Eosinophils Relative: 4 %
HCT: 27.2 % — ABNORMAL LOW (ref 39.0–52.0)
Hemoglobin: 8.8 g/dL — ABNORMAL LOW (ref 13.0–17.0)
Immature Granulocytes: 1 %
Lymphocytes Relative: 12 %
Lymphs Abs: 0.8 10*3/uL (ref 0.7–4.0)
MCH: 30.9 pg (ref 26.0–34.0)
MCHC: 32.4 g/dL (ref 30.0–36.0)
MCV: 95.4 fL (ref 80.0–100.0)
Monocytes Absolute: 0.7 10*3/uL (ref 0.1–1.0)
Monocytes Relative: 10 %
Neutro Abs: 5.1 10*3/uL (ref 1.7–7.7)
Neutrophils Relative %: 73 %
Platelets: 317 10*3/uL (ref 150–400)
RBC: 2.85 MIL/uL — ABNORMAL LOW (ref 4.22–5.81)
RDW: 18.6 % — ABNORMAL HIGH (ref 11.5–15.5)
WBC: 6.9 10*3/uL (ref 4.0–10.5)
nRBC: 0 % (ref 0.0–0.2)

## 2020-10-26 LAB — COMPREHENSIVE METABOLIC PANEL
ALT: 26 U/L (ref 0–44)
AST: 16 U/L (ref 15–41)
Albumin: 2.9 g/dL — ABNORMAL LOW (ref 3.5–5.0)
Alkaline Phosphatase: 63 U/L (ref 38–126)
Anion gap: 5 (ref 5–15)
BUN: 18 mg/dL (ref 8–23)
CO2: 25 mmol/L (ref 22–32)
Calcium: 8.6 mg/dL — ABNORMAL LOW (ref 8.9–10.3)
Chloride: 107 mmol/L (ref 98–111)
Creatinine, Ser: 0.74 mg/dL (ref 0.61–1.24)
GFR, Estimated: >60 mL/min (ref >60)
Glucose, Bld: 132 mg/dL — ABNORMAL HIGH (ref 70–99)
Potassium: 4.2 mmol/L (ref 3.5–5.1)
Sodium: 137 mmol/L (ref 135–145)
Total Bilirubin: 0.3 mg/dL (ref 0.3–1.2)
Total Protein: 5.7 g/dL — ABNORMAL LOW (ref 6.5–8.1)

## 2020-10-26 LAB — MAGNESIUM: Magnesium: 2.2 mg/dL (ref 1.7–2.4)

## 2020-10-26 LAB — GLUCOSE, CAPILLARY
Glucose-Capillary: 125 mg/dL — ABNORMAL HIGH (ref 70–99)
Glucose-Capillary: 133 mg/dL — ABNORMAL HIGH (ref 70–99)
Glucose-Capillary: 136 mg/dL — ABNORMAL HIGH (ref 70–99)
Glucose-Capillary: 122 mg/dL — ABNORMAL HIGH (ref 70–99)
Glucose-Capillary: 137 mg/dL — ABNORMAL HIGH (ref 70–99)

## 2020-10-26 LAB — HAPTOGLOBIN: Haptoglobin: 163 mg/dL (ref 32–363)

## 2020-10-26 LAB — PROCALCITONIN: Procalcitonin: <0.1 ng/mL

## 2020-10-26 LAB — PHOSPHORUS: Phosphorus: 4 mg/dL (ref 2.5–4.6)

## 2020-10-26 MED ORDER — DIPHENOXYLATE-ATROPINE 2.5-0.025 MG/5ML PO LIQD
5.0000 mL | Freq: Every day | ORAL | Status: DC
  Administered 2020-10-26 – 2020-11-10 (×13): 5 mL via ORAL
  Filled 2020-10-26 (×13): qty 5

## 2020-10-26 MED ORDER — JEVITY 1.5 CAL/FIBER PO LIQD
600.0000 mL | ORAL | Status: DC
  Administered 2020-10-26: 600 mL
  Filled 2020-10-26 (×2): qty 711

## 2020-10-26 MED ORDER — JEVITY 1.5 CAL/FIBER PO LIQD
600.0000 mL | ORAL | Status: DC
  Filled 2020-10-26: qty 711

## 2020-10-26 MED ORDER — METOPROLOL TARTRATE 25 MG PO TABS
12.5000 mg | ORAL_TABLET | Freq: Two times a day (BID) | ORAL | Status: DC
  Administered 2020-10-26 – 2020-11-10 (×30): 12.5 mg via ORAL
  Filled 2020-10-26 (×30): qty 1

## 2020-10-26 MED ORDER — JEVITY 1.5 CAL/FIBER PO LIQD
720.0000 mL | ORAL | Status: DC
  Administered 2020-10-27 (×2): 720 mL
  Filled 2020-10-26 (×3): qty 948

## 2020-10-26 MED ORDER — OXYCODONE-ACETAMINOPHEN 5-325 MG PO TABS
1.0000 | ORAL_TABLET | Freq: Once | ORAL | Status: AC | PRN
  Administered 2020-10-26: 1 via ORAL
  Filled 2020-10-26: qty 1

## 2020-10-26 MED ORDER — PROSOURCE TF PO LIQD
45.0000 mL | Freq: Every day | ORAL | Status: DC
  Administered 2020-10-27 – 2020-10-29 (×3): 45 mL
  Filled 2020-10-26 (×6): qty 45

## 2020-10-26 NOTE — Progress Notes (Addendum)
[facility] PROGRESS NOTE  [name] HBZ:[id number] DOB: Jan 15, 1951 DOA: 10/10/2020 PCP: [name], MD  Status: Remains inpatient appropriate because:Altered mental status, Unsafe d/c plan and Inpatient level of care appropriate due to severity of illness   Dispo:  Patient From: Home  Planned Disposition: [place]  Medically stable for discharge: No  Barriers to DC: Continues to require supplemental tube feeding via core track tube.  Has [facility] HMO and thus far no bed offers             Difficult to Place: Yes   Level of care: Progressive  Code Status: Full Family Communication: 4/15 spoke with Richardson Landry (brother) who stated he was in a bar and could not talk at the moment, requested that I call back in the a.m. 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  Patient was found by brother on 3/30 after he had been unable to reach patient for unknown period of time (1 to 3 days suspected).  Patient fracture left frontal sinus, L3 transverse process, displaced angulated mildly comminuted left femoral neck fracture.  AKI with rhabdomyolysis.  On 3/31 patient develop worsening hypoxia and respiratory distress, intubated.  Subsequently extubated 4/2 and transferred out of the ICU on 4/3.  The evening of 4/3--4/4 he developed increased work of breathing and increased oxygen needs requiring intubation.  This was thought to be related to an aspiration event.  Significant events: -3/30 admitted to [facility] for encephalopathy. Facial fx, L3 TP fx, Hip fx.  -3/31 non op from [place] standpoint. Ortho planned to OR 3/31 however acute hypoxic respiratory failure, transferred to ICU and  emergently intubated.  -3/31 ETT >>4/1 -3/31 bronchoscopy with BAL left lower lobe -4/1 LT Hip Hemiarthroplasty (Anterior Approach) -4/4 ETT>>4/6 Currently stable on room air.  PT is recommending SNF. Palliative care was also consulted-patient will remain full code with full scope of medical care. -4/12.. Patient with worsening cough and leukocytosis, started on Unasyn for concern of aspiration.   Subjective: Awake.  Sitting up in bed.  PT working with patient.  PT reports patient having continuous diarrhea.  Patient feels full with continuous tube feedings and is agreeable to transitioning to nocturnal tube feedings.  States feels like he is able to eat better when out of bed to the chair.  Objective: Vitals:   10/26/20 0345 10/26/20 0739  BP: (!) 142/58 (!) 155/44  Pulse:  92  Resp: 19 18  Temp: 97.7 F (36.5 C) 98 F (36.7 C)  SpO2: 100% 90%    Intake/Output Summary (Last 24 hours) at 10/26/2020 0746 Last data filed at 10/26/2020 0416 Gross per 24 hour  Intake 1599.06 ml  Output 3250 ml  Net -1650.94 ml   Filed Weights   10/23/20 0500 10/24/20 0420 10/25/20 0500  Weight: 62.6 kg 60.9 kg 62.1 kg    Exam:  Constitutional: Alert and oriented.  Calm.  Appears to be comfortable Respiratory: Anterior lung sounds are clear to auscultation.  Room air Cardiovascular: S1-S2.  No peripheral edema.  Regular pulse. Abdomen: Tube feeding at 50 cc/h, having frequent diarrheal stools LBM 4/13, nontender nondistended with active bowel sounds Neurologic: CN 2-12 grossly intact. MOE x 4-strength 4/5 Psychiatric: And oriented x3.  Pleasant affect.  Assessment/Plan: Acute problems: Acute Hypoxic Respiratory Failure 2/2: Recurrent aspiration Pneumonia COPD without exacerbation/small cell lung cancer Recent Mucus Plug 4/05. -Extubated 4/6 -Completed 7 days of cefepime 4/05. -Brovana/Yupelri nebulizer treatments and PRN albuterol.  -received Solumedrol on 4/05 during mucus plug  event, prednisone for COPD.  -Preadmission bronchodilators Incruse and Dulera. On nebulizer while inpatient.  -Chest PT to aid mobilization of secretions -4/12 initiated IV Unasyn due to worsening cough and low-grade leukocytosis with concerns over recurrent aspiration event.  Subsequently WBC is down to 7.100-recommend discontinue on 4/19 after 7 days of therapy -Incentive spirometry and flutter valve  Left femoral Neck Fx S/P Hemiarthroplasty:  Ortho following.  Continue wound vac  Nurse will page ortho and inform them. /  PT following-recommending SNF placement.  Left Facial Fx, L 3 TP Fx:  Neurosurgery  evaluated , no surgical intervention needed.  Previous attending physician spoke with ENT- Conservative management recommended since fracture is non displaced  Dysphagia /moderate to severe protein calorie malnutrition Continue tube feeding via cortrack. -4/11 repeat MBSS dysphagia 2 diet with thick liquid -Monitor oral intake to ensure adequate nutrition before discontinuation of tube feeding. -4/15 transition to nocturnal tube feedings 600 cc over 12 hours. -Having recurrent diarrhea possibly related to antibiotics.  We will add Lomotil 5 cc daily and monitor. -Eating about 50 to 75% of meals Nutrition Problem: Severe Malnutrition Etiology: chronic illness (small cell carcinoma of the lung) Signs/Symptoms: severe muscle depletion,severe fat depletion Interventions: Tube feeding,Prostat Estimated body mass index is 20.22 kg/m as calculated from the following:   Height as of this encounter: _0  (1.753 m).   Weight as of this encounter: 62.1 kg.   Thrombocytopenia, anemia; Hb stable at 9.8 as of 4/13 with normal platelets 295,000. S/p PRBC on 10/17/2020   Wounds: Pressure Injury 10/11/20 Sacrum Medial Deep Tissue Pressure Injury - Purple or maroon localized area of discolored intact skin or blood-filled blister due to damage of underlying soft tissue from pressure and/or  shear. (Active)  Date First Assessed/Time First Assessed: 10/11/20 0220   Location: Sacrum  Location Orientation: Medial  Staging: Deep Tissue Pressure Injury - Purple or maroon localized area of discolored intact skin or blood-filled blister due to damage of underlyi...    Assessments 10/11/2020  8:00 PM 10/25/2020 12:39 PM  Dressing Type Foam - Lift dressing to assess site every shift --  Dressing Change Frequency PRN --  Site / Wound Assessment Clean;Dry;Red Clean;Dry  Drainage Amount None None  Treatment Off loading --     No Linked orders to display     Incision (Closed) 10/12/20 Hip Left (Active)  Date First Assessed/Time First Assessed: 10/12/20 1319   Location: Hip  Location Orientation: Left    Assessments 10/12/2020  4:00 PM 10/25/2020 12:37 PM  Dressing Type Negative pressure wound therapy None  Dressing Clean;Dry;Intact Clean;Dry;Intact  Site / Wound Assessment Dressing in place / Unable to assess Clean;Dry  Closure -- Sutures  Drainage Amount None None  Treatment Negative pressure wound therapy;Off loading Cleansed     No Linked orders to display     Negative Pressure Wound Therapy Thigh Anterior;Left;Proximal (Active)  Removal Date: (c)  Placement Date/Time: 10/12/20 0730   Wound Type: Incision (Closed wound)  Location: Thigh  Location Orientation: Anterior;Left;Proximal    Assessments 10/12/2020  4:00 PM 10/18/2020  8:00 PM  Site / Wound Assessment Dressing in place / Unable to assess Dressing in place / Unable to assess  Peri-wound Assessment -- Intact  Wound filler -  Black foam 100 --  Cycle Continuous;On --  Canister Changed -- No  Machine plugged into wall outlet (NOT bed outlet) -- Yes  Dressing Status Intact Intact  Drainage Amount None None     No Linked orders to display     Pressure Injury 10/15/20 Heel Right Deep Tissue Pressure Injury - Purple or maroon localized area of discolored intact skin or blood-filled blister due to damage of underlying soft tissue  from pressure and/or shear. (Active)  Date First Assessed/Time First Assessed: 10/15/20 0323   Location: Heel  Location Orientation: Right  Staging: Deep Tissue Pressure Injury - Purple or maroon localized area of discolored intact skin or blood-filled blister due to damage of underlying ...    Assessments 10/15/2020  3:00 AM 10/25/2020 12:40 PM  Dressing Type Impregnated gauze (bismuth) --  Site / Wound Assessment Clean;Dry Clean;Dry  Drainage Amount None None     No Linked orders to display     Other problems: Metastatic small cell lung carcinoma status post chemotherapy with lurbinectedin last cycle 09/12/20. -Lung, liver -Follows with Dr. Delton Coombes.  -CT head; No acute intracranial pathology   GI, Diarrhea;  Resolved after stopping laxative and change in his tube feed formula.  Rhabdomyolysis.   Resolved with IV fluids.  Acute metabolic encephalopathy: Delirium.   Appears to be at baseline now. Likely in the setting of acute respiratory failure, due to aspiration pneumonia and ICU delirium. Continue Gabapentin,dose was reduced to 100 mg 3 times daily from 300 mg. As of 4/13 patient appropriate  Nausea vomiting:  As of 4/13 no reports of nausea Occurred in setting of constipation, 4/5 KUB nonobstructive bowel pattern   Data Reviewed: Basic Metabolic Panel: Recent Labs  Lab 10/20/20 0206 10/24/20 0904 10/25/20 0149 10/26/20 0404  NA 134* 134* 136 137  K 3.7 4.3 4.2 4.2  CL 102 104 103 107  CO2 _0 GLUCOSE 120* 99 118* 132*  BUN _1 CREATININE 0.66 0.86 0.83 0.74  CALCIUM 8.6* 8.5* 9.0 8.6*  MG  --  2.2 2.4 2.2  PHOS  --  4.2 4.1 4.0   Liver Function Tests: Recent Labs  Lab 10/24/20 0904 10/25/20 0149 10/26/20 0404  AST _2 ALT 44 40 26  ALKPHOS 72 73 63  BILITOT 0.8 0.4 0.3  PROT 5.8* 6.1* 5.7*  ALBUMIN 2.7* 2.9* 2.9*   No results for input(s): LIPASE, AMYLASE in the last 168 hours. No results for input(s): AMMONIA in the last  168 hours. CBC: Recent Labs  Lab 10/20/20 0206 10/23/20 0435 10/24/20 0904 10/25/20 0149 10/26/20 0404  WBC 6.8 11.1* 7.1 7.8 6.9  NEUTROABS  --   --  5.2 5.9 5.1  HGB 9.5* 9.6* 9.8* 10.2* 8.8*  HCT 28.1* 29.0* 29.9* 30.9* 27.2*  MCV 90.9 94.2 95.5 94.2 95.4  PLT 155 270 295 302 317   Cardiac Enzymes: No results for input(s): CKTOTAL, CKMB, CKMBINDEX, TROPONINI in the last 168 hours. BNP (last 3 results) No results for input(s): BNP in the last 8760 hours.  ProBNP (last 3 results) No results for input(s): PROBNP in the last 8760 hours.  CBG: Recent Labs  Lab 10/25/20 1551 10/25/20 2019 10/25/20 2313 10/26/20 0348 10/26/20 0738  GLUCAP 125* 109* 137* 125* 133*    No results found for this or any previous visit (from the past 240 hour(s)).   Studies: DG CHEST PORT 1 VIEW  Result Date: 10/25/2020 CLINICAL DATA:  Short  of breath EXAM: PORTABLE CHEST 1 VIEW COMPARISON:  10/23/2020 FINDINGS: COPD with hyperexpansion and scarring in the bases. Mild left lower lobe airspace disease slightly improved. No effusion or edema. Port-A-Cath tip in the lower SVC. Feeding tube enters the stomach with the tip not visualized. IMPRESSION: COPD.  Improvement in left lower lobe atelectasis/infiltrate. Electronically Signed   By: Franchot Gallo M.D.   On: 10/25/2020 17:27    Scheduled Meds: . arformoterol  15 mcg Nebulization BID  . aspirin  81 mg Per Tube Daily  . chlorhexidine gluconate (MEDLINE KIT)  15 mL Mouth Rinse BID  . Chlorhexidine Gluconate Cloth  6 each Topical Daily  . doxazosin  2 mg Per Tube Daily  . famotidine  20 mg Per Tube BID  . feeding supplement (PROSource TF)  45 mL Per Tube BID  . free water  150 mL Per Tube Q4H  . gabapentin  100 mg Per Tube Q8H  . heparin injection (subcutaneous)  5,000 Units Subcutaneous Q8H  . insulin aspart  0-9 Units Subcutaneous Q4H  . mouth rinse  15 mL Mouth Rinse q12n4p  . melatonin  5 mg Oral QHS  . mirtazapine  30 mg Per Tube QHS   . nicotine  14 mg Transdermal Daily  . revefenacin  175 mcg Nebulization Daily  . sodium chloride flush  10-40 mL Intracatheter Q12H   Continuous Infusions: . sodium chloride 10 mL/hr at 10/19/20 1500  . sodium chloride 50 mL/hr at 10/26/20 0415  . ampicillin-sulbactam (UNASYN) IV 3 g (10/26/20 9622)  . feeding supplement (JEVITY 1.5 CAL/FIBER) 1,000 mL (10/25/20 1809)    Active Problems:   Small cell lung cancer in adult ([place])   Pressure injury of skin   Closed fracture of left hip (H[facility])   Protein-calorie malnutrition, severe   Status post total replacement of left hip   Non-traumatic rhabdomyolysis   Fall   Left-sided weakness   Acute respiratory failure with hypoxia ([place])   Oropharyngeal dysphagia   Sacral pressure ulcer   Orthostatic hypotension   Consultants:  Neurosurgery  Ortho  CCM  Palliative care   Procedures:  ECHO  Total hip arthroplasty  Antibiotics: Anti-infectives (From admission, onward)   Start     Dose/Rate Route Frequency Ordered Stop   10/23/20 1330  Ampicillin-Sulbactam (UNASYN) 3 g in sodium chloride 0.9 % 100 mL IVPB        3 g 200 mL/hr over 30 Minutes Intravenous Every 6 hours 10/23/20 1241     10/15/20 2200  ceFEPIme (MAXIPIME) 2 g in sodium chloride 0.9 % 100 mL IVPB        2 g 200 mL/hr over 30 Minutes Intravenous Every 12 hours 10/15/20 1121 10/16/20 2332   10/15/20 0400  vancomycin (VANCOREADY) IVPB 1500 mg/300 mL  Status:  Discontinued        1,500 mg 150 mL/hr over 120 Minutes Intravenous Every 24 hours 10/15/20 0300 10/15/20 1324   10/12/20 1254  vancomycin (VANCOCIN) powder  Status:  Discontinued          As needed 10/12/20 1255 10/12/20 1328   10/11/20 2100  vancomycin (VANCOREADY) IVPB 1250 mg/250 mL  Status:  Discontinued        1,250 mg 166.7 mL/hr over 90 Minutes Intravenous Every 24 hours 10/10/20 2104 10/12/20 0834   10/11/20 1800  ceFEPIme (MAXIPIME) 2 g in sodium chloride 0.9 % 100 mL IVPB  Status:  Discontinued         2 g 200  mL/hr over 30 Minutes Intravenous Every 8 hours 10/11/20 1230 10/15/20 1121   10/11/20 1200  ceFAZolin (ANCEF) IVPB 2g/100 mL premix        2 g 200 mL/hr over 30 Minutes Intravenous On call to O.R. 10/11/20 1114 10/12/20 0559   10/10/20 2100  vancomycin (VANCOREADY) IVPB 1250 mg/250 mL        1,250 mg 166.7 mL/hr over 90 Minutes Intravenous  Once 10/10/20 2058 10/10/20 2327   10/10/20 2100  ceFEPIme (MAXIPIME) 2 g in sodium chloride 0.9 % 100 mL IVPB  Status:  Discontinued        2 g 200 mL/hr over 30 Minutes Intravenous Every 12 hours 10/10/20 2058 10/11/20 1230       Time spent:  35 minutes    Erin Hearing ANP  [facility] 7 am - 330 pm/M-F for direct patient care and secure chat Please refer to Amion for contact info 16  days

## 2020-10-26 NOTE — Progress Notes (Signed)
Physical Therapy Treatment Patient Details Name: [name] MRN: [id number] DOB: 1950-08-14 Today's Date: 10/26/2020    History of Present Illness Pt is 70 yo admitted 3/30 after fall and found by brother with unknown down time (1-3 days). Fractured L frontal sinus,L L3 transverse process, displaced angulated mildly comminuted L femoral neck fx. AKI with rhabdo. 3/31 pt with hypoxia requiring intubation, extubated 4/1. Pt s/p left anterior hip hemiarthroplasty 4/1. Re-intubated 4/4 for hypoxemia and transfered to ICU, extubated 10/17/20. Progress limited by orthostasis PMhx:hx tobacco abuse, CAD, COPD, Small cell carcinoma mets to lung liver    PT Comments    Pt with improved tolerance for activity. Pt incontinent of stool with extensive clean up which limited further mobility.    Follow Up Recommendations  SNF;Supervision/Assistance - 24 hour     Equipment Recommendations  Rolling walker with 5" wheels;3in1 (PT)    Recommendations for Other Services       Precautions / Restrictions Precautions Precautions: Fall Precaution Comments: Cortrak, O2; orthostasis Restrictions LLE Weight Bearing: Weight bearing as tolerated Other Position/Activity Restrictions: s/p L hemi arthroplasty, anterior approach, no precautions    Mobility  Bed Mobility Overal bed mobility: Needs Assistance Bed Mobility: Rolling;Sidelying to Sit Rolling: Min assist Sidelying to sit: Min assist       General bed mobility comments: Verbal/tactile cues for technique. Assist to elevate trunk into sitting    Transfers Overall transfer level: Needs assistance Equipment used: Rolling walker (2 wheeled) Transfers: Sit to/from [facility] Sit to Stand: +2 physical assistance;Min assist Stand pivot transfers: +2 physical assistance;Min assist;+2 safety/equipment       General transfer comment: Assist to bring hips up and for balance. Small pivotal steps bed to chair  Ambulation/Gait                  Stairs             Wheelchair Mobility    Modified Rankin (Stroke Patients Only)       Balance Overall balance assessment: Needs assistance Sitting-balance support: Feet supported;Bilateral upper extremity supported Sitting balance-Leahy Scale: Poor Sitting balance - Comments: min guard to min assist Postural control: Right lateral lean Standing balance support: Bilateral upper extremity supported Standing balance-Leahy Scale: Poor Standing balance comment: walker and +2 min assist for static standing                            Cognition Arousal/Alertness: Awake/alert Behavior During Therapy: WFL for tasks assessed/performed Overall Cognitive Status: Impaired/Different from baseline Area of Impairment: Memory;Following commands;Safety/judgement;Problem solving                 Orientation Level:  (NT)   Memory: Decreased short-term memory Following Commands: Follows one step commands consistently;Follows multi-step commands inconsistently Safety/Judgement: Decreased awareness of safety;Decreased awareness of deficits   Problem Solving: Slow processing;Requires tactile cues;Requires verbal cues        Exercises      General Comments General comments (skin integrity, edema, etc.): BP 140's/50's in supine and sitting in chair after transfer      Pertinent Vitals/Pain Pain Assessment: No/denies pain    Home Living                      Prior Function            PT Goals (current goals can now be found in the care plan section) Acute Rehab PT Goals Patient  Stated Goal: to go home Progress towards PT goals: Progressing toward goals    Frequency    Min 3X/week      PT Plan Current plan remains appropriate    Co-evaluation              AM-PAC PT "6 Clicks" Mobility   Outcome Measure  Help needed turning from your back to your side while in a flat bed without using bedrails?: A Little Help needed  moving from lying on your back to sitting on the side of a flat bed without using bedrails?: A Little Help needed moving to and from a bed to a chair (including a wheelchair)?: Total Help needed standing up from a chair using your arms (e.g., wheelchair or bedside chair)?: A Lot Help needed to walk in hospital room?: Total Help needed climbing 3-5 steps with a railing? : Total 6 Click Score: 11    End of Session Equipment Utilized During Treatment: Gait belt Activity Tolerance: Other (comment) (limited by incontinence of stool) Patient left: with call bell/phone within reach;in chair;with chair alarm set (bil wrist restraints (therefore returned to bed)) Nurse Communication: Mobility status PT Visit Diagnosis: Other abnormalities of gait and mobility (R26.89);Difficulty in walking, not elsewhere classified (R26.2);Muscle weakness (generalized) (M62.81)     Time: 3818-4037 PT Time Calculation (min) (ACUTE ONLY): 34 min  Charges:  $Therapeutic Activity: 23-37 mins                     [place] Pager [phone] Office [place] 10/26/2020, 10:52 AM

## 2020-10-26 NOTE — Plan of Care (Signed)
  Problem: Education: Goal: Verbalization of understanding the information provided (i.e., activity precautions, restrictions, etc) will improve Outcome: Progressing Goal: Individualized Educational Video(s) Outcome: Progressing   Problem: Activity: Goal: Ability to ambulate and perform ADLs will improve Outcome: Progressing   Problem: Clinical Measurements: Goal: Postoperative complications will be avoided or minimized Outcome: Progressing   Problem: Self-Concept: Goal: Ability to maintain and perform role responsibilities to the fullest extent possible will improve Outcome: Progressing   Problem: Pain Management: Goal: Pain level will decrease Outcome: Progressing   Problem: Education: Goal: Knowledge of General Education information will improve Description: Including pain rating scale, medication(s)/side effects and non-pharmacologic comfort measures Outcome: Progressing   Problem: Health Behavior/Discharge Planning: Goal: Ability to manage health-related needs will improve Outcome: Progressing   Problem: Clinical Measurements: Goal: Ability to maintain clinical measurements within normal limits will improve Outcome: Progressing Goal: Will remain free from infection Outcome: Progressing Goal: Diagnostic test results will improve Outcome: Progressing Goal: Respiratory complications will improve Outcome: Progressing Goal: Cardiovascular complication will be avoided Outcome: Progressing   Problem: Activity: Goal: Risk for activity intolerance will decrease Outcome: Progressing   Problem: Nutrition: Goal: Adequate nutrition will be maintained Outcome: Progressing   Problem: Coping: Goal: Level of anxiety will decrease Outcome: Progressing   Problem: Elimination: Goal: Will not experience complications related to bowel motility Outcome: Progressing Goal: Will not experience complications related to urinary retention Outcome: Progressing   Problem: Pain  Managment: Goal: General experience of comfort will improve Outcome: Progressing   Problem: Safety: Goal: Ability to remain free from injury will improve Outcome: Progressing   Problem: Skin Integrity: Goal: Risk for impaired skin integrity will decrease Outcome: Progressing   Problem: Safety: Goal: Non-violent Restraint(s) Outcome: Progressing

## 2020-10-26 NOTE — Progress Notes (Signed)
Pharmacy Antibiotic Note  [name] is a 70 y.o. male admitted on 10/10/2020 with 3/30 after fall and found by brother with unknown down time (1-3 days). Fractured L frontal sinus,L L3 transverse process, displaced angulated mildly comminuted L femoral neck fx. AKI with rhabdo. 3/31 pt with hypoxia requiring intubation, extubated 4/1. Pt s/p left anterior hip hemiarthroplasty 4/1. Re-intubated 4/4 for hypoxemia and transfered to ICU, extubated 10/17/20.  On 4/12, the patient had worsening cough and leukocytosis.   Pharmacy was consulted on 10/23/20 for Unasyn dosing for aspiration pneumonia.  WBC decreased to wnl.  Afebrile.  No new cultures.    NP notes plan stop Unasyn 4/19 after 7 days.  Plan: Continue Unasyn 3 gm IV every 6 hours , stops 4/19. Monitor clinical status, renal function, culture results     Height: _0  (175.3 cm) Weight: 62.1 kg (136 lb 14.5 oz) IBW/kg (Calculated) : 70.7  Temp (24hrs), Avg:98 F (36.7 C), Min:97.7 F (36.5 C), Max:98.3 F (36.8 C)  Recent Labs  Lab 10/20/20 0206 10/23/20 0435 10/24/20 0904 10/25/20 0149 10/25/20 1704 10/25/20 2229 10/26/20 0404  WBC 6.8 11.1* 7.1 7.8  --   --  6.9  CREATININE 0.66  --  0.86 0.83  --   --  0.74  LATICACIDVEN  --   --   --   --  1.0 1.0  --     Estimated Creatinine Clearance: 76.5 mL/min (by C-G formula based on SCr of 0.74 mg/dL).    Allergies  Allergen Reactions  . Codeine Nausea Only  . Niaspan [Niacin Er]     Antimicrobials this admission: Vanc 3/30 >> 4/1, 4/4 x1 Cefepime 3/30 >> 4/5 Unasyn 4/12>> stop 4/19    Microbiology results: 4/4 trach asp rare budding yeast : few candida krusei. final 4/4 MRSA PCR - negative 3/31 MRSA PCR - negative 3/31 BCx - negative  3/31 UCx - no growth 3/31 BAL - normal flora   Thank you for allowing pharmacy to be a part of this patient's care.  Nicole Cella, RPh Clinical Pharmacist [phone] Please check AMION for all [place] phone numbers After  10:00 PM, call [place] [phone]  10/26/2020 11:54 AM

## 2020-10-26 NOTE — Care Management Important Message (Signed)
Important Message  Patient Details  Name: [name] MRN: [id number] Date of Birth: [birth year]   Medicare Important Message Given:  Yes - Important Message mailed due to current National Emergency   Verbal consent obtained due to current National Emergency  Relationship to patient: Self Contact Name: Merrel Call Date: 10/26/20  Time: 1027 Phone: [phone] Outcome: No Answer/Busy Important Message mailed to: Patient address on file    [name] 10/26/2020, 10:27 AM

## 2020-10-26 NOTE — Progress Notes (Signed)
  Speech Language Pathology Treatment: Dysphagia  Patient Details Name: [name] MRN: [id number] DOB: 06-May-1951 Today's Date: 10/26/2020 Time: 4098-1191 SLP Time Calculation (min) (ACUTE ONLY): 11.6 min  Assessment / Plan / Recommendation Clinical Impression  Pt was seen for dysphagia treatment. He was alert and cooperative during the session without c/o pain. Pt stated that he is still not eating much. Pt's NT reported that the pt did not demonstrate s/sx of aspiration with limited p.o. intake this morning. Pt refused all solids despite education and encouragement. Pt exhibited coughing with a single large bolus of nectar thick liquids, but tolerated all other liquid boluses without overt s/sx of aspiration. Pt appears to be tolerating the advanced liquids adequately with supervision. SLP will continue to follow pt.    HPI HPI: Pt is a 70 yo male admitted 3/30 with AMS after fall with unknown downtime (1-3 days suspected). Pt was found to have L hip fx, L L3 transverse process fx, L frontal sinus fx, and AKI with rhabdomyolysis. On 3/31 pt had worsening hypoxia thought to be related to an aspiration event, requiring ETT 3/31-4/2; another suspected aspiration event on requiring re-intubation 4/4-4/6. 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 Pt was reporting intermittent difficulty swallowing solids in 2019 per cancer center notes. Palliative care has been consulted and as of 4/13, family would like full scope of care. MBS 4/11: oropharyngeal dysphagia characterized by reduced bolus cohesion, a pharyngeal delay, reduced lingual retraction, and reduced anterior laryngeal movement. He demonstrated premature spillage to the valleculae with inconsistent spillover to the pyriform sinuses, base of tongue residue, vallecular residue, pyriform sinus residue, penetration (PAS 3, 5) and inconsistent  aspiration (PAS 7) of thin and nectar thick iquids. CXR 4/12: Mildly improved left lower lobe pneumonia. Mild changes of COPD.      SLP Plan  Continue with current plan of care       Recommendations  Liquids provided via: Cup;No straw Medication Administration: Whole meds with puree Supervision: Staff to assist with self feeding;Full supervision/cueing for compensatory strategies Compensations: Slow rate;Small sips/bites;Minimize environmental distractions;Follow solids with liquid Postural Changes and/or Swallow Maneuvers: Seated upright 90 degrees                Oral Care Recommendations: Oral care BID Follow up Recommendations: Skilled Nursing facility SLP Visit Diagnosis: Dysphagia, oropharyngeal phase (R13.12) Plan: Continue with current plan of care       Terell Kincy I. Hardin Negus, [place], [place] Office number [phone] Pager [place] 10/26/2020, 1:06 PM

## 2020-10-26 NOTE — Progress Notes (Signed)
Nutrition Follow-up  DOCUMENTATION CODES:   Severe malnutrition in context of chronic illness  INTERVENTION:  -48 hour Calorie Count; RD to follow-up with results on Monday, 4/18 -Mighty Shake po TID, each supplement provides 330 kcal and 9 grams of protein -Magic cup TID with meals, each supplement provides 290 kcal and 9 grams of protein  Transition to nocturnal TF via Cortrak: -Jevity 1.5 @ 68ml/hr administered for 12 hours from 1800-0600 -97ml Prosource TF daily -Free water per MD/NP, currently 157ml Q4H  Nocturnal TF provides 1120 kcals, 57g protein, 536ml free water (Meets 62% minimum estimated calorie needs and 67% estimated protein needs)   NUTRITION DIAGNOSIS:   Severe Malnutrition related to chronic illness (small cell carcinoma of the lung) as evidenced by severe muscle depletion,severe fat depletion.  ongoing  GOAL:   Patient will meet greater than or equal to 90% of their needs  Being addressed with TF and supplements  MONITOR:   Diet advancement,Labs,TF tolerance,Weight trends,Skin  REASON FOR ASSESSMENT:   Consult Enteral/tube feeding initiation and management (change tube feeding to help with diarrhea)  ASSESSMENT:   70 year old male who presented to the ED on 3/30 with AMS after a fall. Pt with unknown time down (1-3 days suspected). 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 Pt found to have left hip fracture, left L3 transverse process fracture, left frontal sinus fracture as well as rhabdomyolysis.  3/31 - rapid response for respiratory distress, transferred to ICU, intubated, therapeutic bronchoscopy 4/01 - s/p left hip hemiarthroplasty, application of incisional VAC, extubated 4/04 - rapid response due to acute hypoxemic respiratory failure, transferred to ICU, intubated 4/05 - pt vomited, TF held 4/06 - Cortrak placed, extubated 4/08 - pt  pulled wound VAC off of left hip 4/11 - s/p MBSS, dysphagia 2 diet with honey thick liquids ordered 4/14 - diet advanced to dysphagia 2 with nectar thick liquids  Pt reports no nausea for the last couple of days and states he has been doing fairly well with meals, but feels full due to the TF. Will transition pt to nocturnal TF to allow for more po intake and Will also order calorie count to determine adequacy of po intake. RD to follow-up with results on Monday, 4/18. Discussed with RN.   Limited meal documentation since last RD assessment, 10-75% x 3 recorded meals (~53% average meal intake). Per NP, pt eating 50-75% of most meals.   Pt still c/o diarrhea. Lomotil ordered today.   4/01 weight 62.5 kg Current weight 62.1 kg   UOP: 325ml x24 hours  Medications: lomotil, pepcid, SSI Q4H, remeron Labs reviewed. CBGs [phone]  Diet Order:   Diet Order            DIET DYS 2 Room service appropriate? Yes with Assist; Fluid consistency: Nectar Thick  Diet effective now                 EDUCATION NEEDS:   No education needs have been identified at this time  Skin:  Skin Assessment: Skin Integrity Issues: Skin Integrity Issues:: DTI,Incisions DTI: sacrum, right heel Stage I: - Incisions: left hip  Last BM:  4/15 type 2 via rectal tube  Height:   Ht Readings from Last 1 Encounters:  10/11/20 5\' 9"  (1.753 m)    Weight:   Wt Readings from Last 1 Encounters:  10/25/20 62.1 kg   BMI:  Body mass index is 20.22 kg/m.  Estimated Nutritional Needs:   Kcal:  1800-2000  Protein:  85-100 grams  Fluid:  >/= 1.8 L   Larkin Ina, MS, RD, LDN RD pager number and weekend/on-call pager number located in [place].

## 2020-10-26 NOTE — Progress Notes (Addendum)
Noted at 1828 7 beat run of v tach and at 1838 22 beat run of vtach. BP 164/44. Paged placed to MD. Pt denied chest pain, he stated he just didn't fell well like he was tired. Lung sound with rhonchi. IAspirated back 30ml of tube feed from core tract  And replaced back.  Received call back from MD verbal order for metoprolol BID first dose now.

## 2020-10-27 ENCOUNTER — Inpatient Hospital Stay (HOSPITAL_COMMUNITY): Payer: Medicare HMO

## 2020-10-27 ENCOUNTER — Encounter: Payer: Self-pay | Admitting: Radiation Oncology

## 2020-10-27 DIAGNOSIS — J9601 Acute respiratory failure with hypoxia: Secondary | ICD-10-CM | POA: Diagnosis not present

## 2020-10-27 DIAGNOSIS — L89156 Pressure-induced deep tissue damage of sacral region: Secondary | ICD-10-CM

## 2020-10-27 DIAGNOSIS — R4182 Altered mental status, unspecified: Secondary | ICD-10-CM | POA: Diagnosis not present

## 2020-10-27 DIAGNOSIS — S72002A Fracture of unspecified part of neck of left femur, initial encounter for closed fracture: Secondary | ICD-10-CM | POA: Diagnosis not present

## 2020-10-27 DIAGNOSIS — I10 Essential (primary) hypertension: Secondary | ICD-10-CM | POA: Diagnosis present

## 2020-10-27 DIAGNOSIS — D696 Thrombocytopenia, unspecified: Secondary | ICD-10-CM

## 2020-10-27 DIAGNOSIS — C3490 Malignant neoplasm of unspecified part of unspecified bronchus or lung: Secondary | ICD-10-CM | POA: Diagnosis present

## 2020-10-27 DIAGNOSIS — S0219XA Other fracture of base of skull, initial encounter for closed fracture: Secondary | ICD-10-CM | POA: Diagnosis not present

## 2020-10-27 DIAGNOSIS — Z720 Tobacco use: Secondary | ICD-10-CM

## 2020-10-27 LAB — COMPREHENSIVE METABOLIC PANEL
ALT: 22 U/L (ref 0–44)
AST: 19 U/L (ref 15–41)
Albumin: 2.9 g/dL — ABNORMAL LOW (ref 3.5–5.0)
Alkaline Phosphatase: 63 U/L (ref 38–126)
Anion gap: 7 (ref 5–15)
BUN: 16 mg/dL (ref 8–23)
CO2: 25 mmol/L (ref 22–32)
Calcium: 8.9 mg/dL (ref 8.9–10.3)
Chloride: 106 mmol/L (ref 98–111)
Creatinine, Ser: 0.81 mg/dL (ref 0.61–1.24)
GFR, Estimated: >60 mL/min (ref >60)
Glucose, Bld: 124 mg/dL — ABNORMAL HIGH (ref 70–99)
Potassium: 4.5 mmol/L (ref 3.5–5.1)
Sodium: 138 mmol/L (ref 135–145)
Total Bilirubin: 0.3 mg/dL (ref 0.3–1.2)
Total Protein: 5.8 g/dL — ABNORMAL LOW (ref 6.5–8.1)

## 2020-10-27 LAB — CBC WITH DIFFERENTIAL/PLATELET
Abs Immature Granulocytes: 0.05 10*3/uL (ref 0.00–0.07)
Basophils Absolute: 0 10*3/uL (ref 0.0–0.1)
Basophils Relative: 1 %
Eosinophils Absolute: 0.2 10*3/uL (ref 0.0–0.5)
Eosinophils Relative: 4 %
HCT: 27.8 % — ABNORMAL LOW (ref 39.0–52.0)
Hemoglobin: 9 g/dL — ABNORMAL LOW (ref 13.0–17.0)
Immature Granulocytes: 1 %
Lymphocytes Relative: 12 %
Lymphs Abs: 0.7 10*3/uL (ref 0.7–4.0)
MCH: 30.9 pg (ref 26.0–34.0)
MCHC: 32.4 g/dL (ref 30.0–36.0)
MCV: 95.5 fL (ref 80.0–100.0)
Monocytes Absolute: 0.6 10*3/uL (ref 0.1–1.0)
Monocytes Relative: 9 %
Neutro Abs: 4.5 10*3/uL (ref 1.7–7.7)
Neutrophils Relative %: 73 %
Platelets: 336 10*3/uL (ref 150–400)
RBC: 2.91 MIL/uL — ABNORMAL LOW (ref 4.22–5.81)
RDW: 18.6 % — ABNORMAL HIGH (ref 11.5–15.5)
WBC: 6.1 10*3/uL (ref 4.0–10.5)
nRBC: 0 % (ref 0.0–0.2)

## 2020-10-27 LAB — PROCALCITONIN: Procalcitonin: <0.1 ng/mL

## 2020-10-27 LAB — PHOSPHORUS: Phosphorus: 3.9 mg/dL (ref 2.5–4.6)

## 2020-10-27 LAB — MAGNESIUM: Magnesium: 2.2 mg/dL (ref 1.7–2.4)

## 2020-10-27 LAB — GLUCOSE, CAPILLARY
Glucose-Capillary: 106 mg/dL — ABNORMAL HIGH (ref 70–99)
Glucose-Capillary: 111 mg/dL — ABNORMAL HIGH (ref 70–99)
Glucose-Capillary: 112 mg/dL — ABNORMAL HIGH (ref 70–99)
Glucose-Capillary: 126 mg/dL — ABNORMAL HIGH (ref 70–99)
Glucose-Capillary: 141 mg/dL — ABNORMAL HIGH (ref 70–99)
Glucose-Capillary: 156 mg/dL — ABNORMAL HIGH (ref 70–99)

## 2020-10-27 MED ORDER — GADOBUTROL 1 MMOL/ML IV SOLN
6.5000 mL | Freq: Once | INTRAVENOUS | Status: AC | PRN
  Administered 2020-10-27: 6.5 mL via INTRAVENOUS

## 2020-10-27 MED ORDER — SODIUM CHLORIDE 0.9 % IV SOLN
10.0000 mg | Freq: Once | INTRAVENOUS | Status: AC
  Administered 2020-10-27: 10 mg via INTRAVENOUS
  Filled 2020-10-27: qty 1

## 2020-10-27 MED ORDER — INSULIN ASPART 100 UNIT/ML ~~LOC~~ SOLN: 0.0000 [IU] | SUBCUTANEOUS | Status: DC | PRN

## 2020-10-27 MED ORDER — SODIUM CHLORIDE 0.9 % IV SOLN
4.0000 mg | Freq: Four times a day (QID) | INTRAVENOUS | Status: DC
  Administered 2020-10-27 – 2020-10-28 (×3): 4 mg via INTRAVENOUS
  Filled 2020-10-27 (×6): qty 0.4

## 2020-10-27 MED ORDER — HYDRALAZINE HCL 20 MG/ML IJ SOLN: 5.0000 mg | INTRAMUSCULAR | Status: DC | PRN

## 2020-10-27 MED ORDER — INSULIN ASPART 100 UNIT/ML ~~LOC~~ SOLN: 0.0000 [IU] | SUBCUTANEOUS | Status: DC

## 2020-10-27 NOTE — Progress Notes (Addendum)
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  Patient was found by brother on 3/30 after he had been unable to reach patient for unknown period of time (1 to 3 days suspected).  Patient fracture left frontal sinus, L3 transverse process, displaced angulated mildly comminuted left femoral neck fracture.  AKI with rhabdomyolysis.  On 3/31 patient develop worsening hypoxia and respiratory distress, intubated.  Subsequently extubated 4/2 and transferred out of the ICU on 4/3.  The evening of 4/3--4/4 he developed increased work of breathing and increased oxygen needs requiring intubation.  This was thought to be related to an aspiration event.   Subjective: 4/16 afebrile overnight, while patient was working with PT today began to have double vision, dizziness and an overall sense of not feeling very well.   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 [facility])   Left displaced femoral neck fracture ([place])   Closed fracture of transverse process of lumbar vertebra (HCC)   Pressure injury of skin   Closed fracture of left hip (HCC)   Protein-calorie malnutrition, severe   Status post total replacement of left hip   Non-traumatic rhabdomyolysis   Fall   Left-sided weakness   Acute respiratory failure with hypoxia (HCC)   Oropharyngeal dysphagia   Sacral pressure ulcer   Orthostatic hypotension   Primary malignant neoplasm of lung with metastasis to brain [facility])   Essential hypertension   Thrombocytopenia (HCC)   Tobacco abuse   Acute respiratory failure with hypoxia/Aspiration pneumonia/Emphysema  -4/5 Mucus Plug -4/14 PCXR pending: Although patient does  not feel SOB SPO2 drops with 4 L nasal cannula on.  Most likely secondary to patient being a mouth breather and having NG tube in place.   - Place patient on Ventimask when sleeping: Mouth breather  Hx Small cell lung cancer  -S/p chemotherapy Lurbinectedin last cycle 09/12/20. -Extubated 4\6 -Completed 7 days of cefepime 4/05. -Brovana/Yupelri nebulizer treatments and PRN albuterol.  -received Solumedrol on 4/05 during mucus plug event, prednisone for COPD.  -Patient  To be discharge on Incruse and Dulera. On nebulizer while inpatient.  -Chest PT to see if he can bring up his secretions. -Complete 5-day course Unasyn due to worsening cough and leukocytosis.  (Last date 4/17) -Incentive spirometry and flutter well.  Metastatic small cell carcinoma -Lung, liver, brain  -Follows with Dr. Delton Coombes.  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 Delton Coombes.[facility] oncology who agrees with starting patient on steroids and starting radiation treatment - 4/16 discussed case with Dr. Ashok Pall neurosurgery who does not believe patient currently needs shunt however states if radiation oncology requires shunt placement he is available for placement. -4/16 discussed case with Dr. Sondra Come radiation oncology requested I transfer patient to [facility] in order to begin radiation treatment in the a.m.  Orthostatic hypotension - 4/14 albumin 25 g - 4/14 TED hose 12 hours on/12 hours off - 4/14 normal saline 50 ml/hr - 4/16 DC normal saline  Essential HTN/Ventricular tachycardia -  Overnight reported patient had a couple runs of ventricular tachycardia in addition rise in BP.  Most likely secondary to his multiple brain mets with hydrocephalus - We will allow permissive HTN - Metoprolol 12.5 mg BID - Hydralazine PRN SBP> 170 or DBP>  110  Nausea/vomiting -4/14 resolved   Nutrition;  -On tube feeding.  Through core track. -Swallow evaluation was attempted again today but patient was keeping pured diet and his mouth, current recommendations remained to maintain n.p.o. and continue feeding through tube - If patient needs long-term tube feeding-we will order PEG tube with IR. -Speech placed him on dysphagia 2 diet with thick liquid after getting a barium swallow, please see the full report-remain high risk for aspiration.  GI, Diarrhea;  -resolved after stopping laxative and change in his tube feed formula.  Acute metabolic encephalopathy: Delirium.   -Likely in the setting of acute respiratory failure, due to aspiration pneumonia and ICU delirium. -Continue Gabapentin,dose was reduced to 100 mg 3 times daily from 300 mg. -4/14 resolved answered all questions appropriately.   Dizziness - 4/16 patient reports new onset dizziness, when patient worked with PT, PT noticed eyes are "jumping" during smooth pursuits and during saccades. He reports sometimes objects look like they are moving back and forth and sometimes his hand is moving back and forth as he tries to reach for something -4/16 MRI brain pending: Positive for multiple mets to brain see results below  Left femoral Neck Fx S/P Hemiarthroplasty:  -Ortho following.  -PT following-recommending SNF placement.  Left Facial Fx, L 3 TP Fx:  -Neurosurgery  evaluated , no surgical intervention needed.  -Neurosurgery recommend ENT consult. -Spoke with ENT on called for [place] recommend Conservative management, Fracture is non displaced, does not need anything acute.   Rhabdomyolysis.   -Resolved with IV fluids.   Thrombocytopenia,  -Thrombocytopenia resolved.    Normocytic Anemia  -4/6 transfuse 1 unit PRBC Lab Results  Component Value Date   HGB 9.0 (L) 10/27/2020   HGB 8.8 (L) 10/26/2020   HGB 10.2 (L) 10/25/2020   HGB 9.8 (L) 10/24/2020   HGB 9.6 (L)  10/23/2020  -4/13 anemia consistent with normocytic anemia -4/13 occult blood pending  Tobacco abuse -Needs counseling.    Sepsis was ruled out.   Sacral pressure ulcer Pressure Injury 10/11/20 Sacrum Medial Deep Tissue Pressure Injury - Purple or maroon localized area of discolored intact skin or blood-filled blister due to damage of underlying soft tissue from pressure and/or shear. (Active)  10/11/20 0220  Location: Sacrum  Location Orientation: Medial  Staging: Deep Tissue Pressure Injury - Purple or maroon localized area of discolored intact skin or blood-filled blister due to damage of underlying soft tissue from pressure and/or shear.  Wound Description (Comments):   Present on Admission: Yes     Pressure Injury 10/15/20 Heel Right Deep Tissue Pressure Injury - Purple or maroon localized area of discolored intact skin or blood-filled blister due to damage of underlying soft tissue from pressure and/or shear. (Active)  10/15/20 0323  Location: Heel  Location Orientation: Right  Staging: Deep Tissue Pressure Injury - Purple or maroon localized area of discolored intact skin or blood-filled blister due to damage of underlying soft tissue from pressure and/or shear.  Wound Description (Comments):   Present on Admission: No   Severe protein calorie malnutrition - Continue tube feeds   DVT prophylaxis: Subcu heparin Code Status: Full Family Communication: 4/16 spoke with Richardson Landry (brother) discussed plan of care answered all questions  Status is: Inpatient    Dispo: The patient is from: Home              Anticipated d/c is to: SNF              Anticipated d/c date is: 4/21              Patient currently unstable      Consultants:  ENT Dr. Delton Coombes.[facility] oncology Dr. Ashok Pall neurosurgery Dr. Sondra Come radiation oncology    Procedures/Significant Events:  3/30 admitted to [facility] for encephalopathy. Facial fx, L3 TP fx, Hip fx.  -3/31 non op from [place]  standpoint. Ortho planned to OR 3/31 however acute hypoxic respiratory failure, transferred to ICU and emergently intubated.  -3/31 ETT >>4/1 -3/31 bronchoscopy with BAL left lower lobe -4/1 LT Hip Hemiarthroplasty (Anterior Approach) -4/4 ETT>>4/6 4/4 Currently stable on room air.  PT is recommending SNF. Palliative care was also consulted-patient will remain full code with full scope of medical care. -4/12.. Patient with worsening cough and leukocytosis, started on Unasyn for concern of aspiration.  4/16 MRI brain W/W0 contrast; -Positive for metastatic disease to the brain, with several large cerebellar metastases, up to 3.9 cm. -Subsequent severe cerebellar edema with partially effaced 4th ventricle and cerebral aqueduct, and associated hydrocephalus with mild transependymal edema.  -Four brain metastases overall, including a 13 mm right caudate lesion. Suspected cystic metastasis or ex-nodal disease which is enlarging at the left nasopharynx, fossa of Rosenmuller since February. In August last year this lesion was suspected to be cystic/necrotic lymphadenopathy by Neck CT.   I have personally reviewed and interpreted all radiology studies and my findings are as above.  VENTILATOR SETTINGS: Room air 4/16 SPO2 95%   Cultures 4/4 tracheal aspirate positive rare Candida Krusei  4/4 MRSA by PCR negative   Antimicrobials: Anti-infectives (From admission, onward)   Start     Ordered Stop   10/23/20 1330  Ampicillin-Sulbactam (UNASYN) 3 g in sodium chloride 0.9 % 100 mL IVPB        10/23/20 1241 10/28/20 2359   10/15/20 2200  ceFEPIme (MAXIPIME) 2 g in sodium chloride 0.9 % 100 mL IVPB        10/15/20 1121 10/16/20 2332   10/15/20 0400  vancomycin (VANCOREADY) IVPB 1500 mg/300 mL  Status:  Discontinued        10/15/20 0300 10/15/20 1324   10/12/20 1254  vancomycin (VANCOCIN) powder  Status:  Discontinued        10/12/20 1255 10/12/20 1328   10/11/20 2100  vancomycin  (VANCOREADY) IVPB 1250 mg/250 mL  Status:  Discontinued        10/10/20 2104 10/12/20 0834   10/11/20 1800  ceFEPIme (MAXIPIME) 2 g in sodium chloride 0.9 % 100 mL IVPB  Status:  Discontinued        10/11/20 1230 10/15/20 1121   10/11/20 1200  ceFAZolin (ANCEF) IVPB 2g/100 mL premix        10/11/20 1114 10/12/20 0559   10/10/20 2100  vancomycin (VANCOREADY) IVPB 1250 mg/250 mL        10/10/20 2058 10/10/20 2327   10/10/20 2100  ceFEPIme (MAXIPIME) 2 g in sodium chloride 0.9 % 100 mL IVPB  Status:  Discontinued        10/10/20 2058 10/11/20 1230       Devices    LINES / TUBES:      Continuous Infusions: . sodium chloride 10 mL/hr at 10/19/20 1500  . sodium chloride  50 mL/hr at 10/27/20 0102  . ampicillin-sulbactam (UNASYN) IV 3 g (10/27/20 0716)  . dexamethasone (DECADRON) IVPB ([facility])    . dexamethasone (DECADRON) IVPB ([facility])       Objective: Vitals:   10/27/20 0414 10/27/20 0717 10/27/20 0839 10/27/20 1200  BP: (!) 142/61 (!) 154/47  (!) 171/59  Pulse:  75  75  Resp: _0 Temp: 98.1 F (36.7 C) (!) 97.5 F (36.4 C)  97.9 F (36.6 C)  TempSrc: Oral Oral  Oral  SpO2: 99% 95% 97% 95%  Weight:  64.3 kg    Height:        Intake/Output Summary (Last 24 hours) at 10/27/2020 1355 Last data filed at 10/27/2020 0700 Gross per 24 hour  Intake 1720 ml  Output 1550 ml  Net 170 ml   Filed Weights   10/24/20 0420 10/25/20 0500 10/27/20 0717  Weight: 60.9 kg 62.1 kg 64.3 kg    Examination:  General: A/O x4, positive acute respiratory distress, cachectic Eyes: negative scleral hemorrhage, negative anisocoria, negative icterus ENT: Negative Runny nose, negative gingival bleeding, Neck:  Negative scars, masses, torticollis, lymphadenopathy, JVD Lungs: Clear to auscultation bilaterally without wheezes or crackles Cardiovascular: Regular rate and rhythm without murmur gallop or rub normal S1 and S2 Abdomen: negative abdominal pain, nondistended, positive soft, bowel  sounds, no rebound, no ascites, no appreciable mass Extremities: No significant cyanosis, clubbing, or edema bilateral lower extremities Skin: Multiple facial bruises Psychiatric:  Negative depression, negative anxiety, negative fatigue, negative mania  Central nervous system:  Cranial nerves II through XII intact, tongue/uvula midline, all extremities muscle strength 5/5, sensation intact throughout, negative dysarthria, negative expressive aphasia, negative receptive aphasia.  Positive diplopia, feeling of dizziness when ambulating  .     Data Reviewed: Care during the described time interval was provided by me .  I have reviewed this patient's available data, including medical history, events of note, physical examination, and all test results as part of my evaluation.  CBC: Recent Labs  Lab 10/23/20 0435 10/24/20 0904 10/25/20 0149 10/26/20 0404 10/27/20 0122  WBC 11.1* 7.1 7.8 6.9 6.1  NEUTROABS  --  5.2 5.9 5.1 4.5  HGB 9.6* 9.8* 10.2* 8.8* 9.0*  HCT 29.0* 29.9* 30.9* 27.2* 27.8*  MCV 94.2 95.5 94.2 95.4 95.5  PLT 270 295 302 317 623   Basic Metabolic Panel: Recent Labs  Lab 10/24/20 0904 10/25/20 0149 10/26/20 0404 10/27/20 0122  NA 134* 136 137 138  K 4.3 4.2 4.2 4.5  CL 104 103 107 106  CO2 _1 GLUCOSE 99 118* 132* 124*  BUN _2 CREATININE 0.86 0.83 0.74 0.81  CALCIUM 8.5* 9.0 8.6* 8.9  MG 2.2 2.4 2.2 2.2  PHOS 4.2 4.1 4.0 3.9   GFR: Estimated Creatinine Clearance: 78.3 mL/min (by C-G formula based on SCr of 0.81 mg/dL). Liver Function Tests: Recent Labs  Lab 10/24/20 0904 10/25/20 0149 10/26/20 0404 10/27/20 0122  AST _3 ALT 44 40 26 22  ALKPHOS 72 73 63 63  BILITOT 0.8 0.4 0.3 0.3  PROT 5.8* 6.1* 5.7* 5.8*  ALBUMIN 2.7* 2.9* 2.9* 2.9*   No results for input(s): LIPASE, AMYLASE in the last 168 hours. No results for input(s): AMMONIA in the last 168 hours. Coagulation Profile: No results for input(s): INR, PROTIME in  the last 168 hours. Cardiac Enzymes: No results for input(s): CKTOTAL, CKMB, CKMBINDEX, TROPONINI in the last 168 hours. BNP (  last 3 results) No results for input(s): PROBNP in the last 8760 hours. HbA1C: No results for input(s): HGBA1C in the last 72 hours. CBG: Recent Labs  Lab 10/26/20 2002 10/27/20 0015 10/27/20 0418 10/27/20 0715 10/27/20 1149  GLUCAP 137* 156* 141* 126* 106*   Lipid Profile: No results for input(s): CHOL, HDL, LDLCALC, TRIG, CHOLHDL, LDLDIRECT in the last 72 hours. Thyroid Function Tests: No results for input(s): TSH, T4TOTAL, FREET4, T3FREE, THYROIDAB in the last 72 hours. Anemia Panel: Recent Labs    10/25/20 0149  VITAMINB12 677  FOLATE 19.1  FERRITIN 419*  TIBC 255  IRON 48  RETICCTPCT 3.5*   Sepsis Labs: Recent Labs  Lab 10/23/20 1245 10/25/20 1704 10/25/20 2229 10/26/20 0404 10/27/20 0122  PROCALCITON <0.10 <0.10  --  <0.10 <0.10  LATICACIDVEN  --  1.0 1.0  --   --     No results found for this or any previous visit (from the past 240 hour(s)).       Radiology Studies: MR BRAIN W WO CONTRAST  Result Date: 10/27/2020 CLINICAL DATA:  70 year old male with history of metastatic small cell carcinoma. Encephalopathy after fall last month. Rhabdomyolysis. Unexplained neurologic deficits, dizziness, double vision. Restaging. EXAM: MRI HEAD WITHOUT AND WITH CONTRAST TECHNIQUE: Multiplanar, multiecho pulse sequences of the brain and surrounding structures were obtained without and with intravenous contrast. CONTRAST:  6.55m GADAVIST GADOBUTROL 1 MMOL/ML IV SOLN COMPARISON:  Head CT without contrast 10/15/2020 and earlier. Brain MRI 09/07/2019. CTA head and neck 10/10/2020, and earlier. FINDINGS: Brain: Ventriculomegaly, new since last year with dilated temporal horns. Up to mild transependymal edema. No restricted diffusion or evidence of acute infarction. But there is abnormal diffusion associated with multiple enhancing brain masses. The  largest is a lobulated and heterogeneously enhancing 39 mm mass in the central lower cerebellum (series 11, image 12). There are 2 additional nearby cerebellar metastases, 23 mm in the left superior and up to 24 mm in the right superior cerebellum (same image). Subsequent severe cerebellar edema. Partially effaced 4th ventricle and basilar cisterns. Partially effaced cerebral aqueduct. No tonsillar herniation. Additional round 13 mm right caudate metastasis on series 10, image 27. mild edema at that site. Each of the lesions demonstrates mild hemosiderin. No supratentorial midline shift. No dural thickening. Superimposed widespread chronic cerebral white matter T2 and FLAIR hyperintensity. Superimposed scattered chronic microhemorrhages elsewhere in the brain, likely small vessel related and not significantly changed since last year allowing for SWI technique today. No extra-axial collection or acute intracranial hemorrhage. Pituitary within normal limits. Vascular: Major intracranial vascular flow voids are stable since last year. Skull and upper cervical spine: Negative visible upper cervical spine, spinal cord. Visualized bone marrow signal is within normal limits. Sinuses/Orbits: Postoperative changes to both globes. Orbits appear stable and negative. Normal suprasellar cistern. Normal cavernous sinus. Paranasal Visualized paranasal sinuses and mastoids are stable and well aerated. Other: Chronic left mastoid effusion has mildly increased. Rim enhancing multi cystic appearing mass at the left fossa of Rosenmuller is new since 2018, and significantly increased since last year now measuring up to the 3 cm (series 11, image 19). This does not seem to directly correspond to what were thought to be small benign midline nasopharyngeal cysts in 2018. And this has progressed from a neck CT 09/03/2020 Other visible upper neck soft tissues appear negative. IMPRESSION: 1. Positive for metastatic disease to the brain, with  several large cerebellar metastases, up to 3.9 cm. 2. Subsequent severe cerebellar edema with partially effaced  4th ventricle and cerebral aqueduct, and associated hydrocephalus with mild transependymal edema. Recommend Neurosurgery consultation. 3. Four brain metastases overall, including a 13 mm right caudate lesion. 4. Suspected cystic metastasis or ex-nodal disease which is enlarging at the left nasopharynx, fossa of Rosenmuller since February. In August last year this lesion was suspected to be cystic/necrotic lymphadenopathy by Neck CT. Study discussed by telephone with Dr. Dia Crawford on 10/27/2020 at 11:35 . Electronically Signed   By: Genevie Ann M.D.   On: 10/27/2020 11:41   DG CHEST PORT 1 VIEW  Result Date: 10/25/2020 CLINICAL DATA:  Short of breath EXAM: PORTABLE CHEST 1 VIEW COMPARISON:  10/23/2020 FINDINGS: COPD with hyperexpansion and scarring in the bases. Mild left lower lobe airspace disease slightly improved. No effusion or edema. Port-A-Cath tip in the lower SVC. Feeding tube enters the stomach with the tip not visualized. IMPRESSION: COPD.  Improvement in left lower lobe atelectasis/infiltrate. Electronically Signed   By: Franchot Gallo M.D.   On: 10/25/2020 17:27        Scheduled Meds: . arformoterol  15 mcg Nebulization BID  . aspirin  81 mg Per Tube Daily  . chlorhexidine gluconate (MEDLINE KIT)  15 mL Mouth Rinse BID  . Chlorhexidine Gluconate Cloth  6 each Topical Daily  . diphenoxylate-atropine  5 mL Oral Daily  . doxazosin  2 mg Per Tube Daily  . famotidine  20 mg Per Tube BID  . feeding supplement (JEVITY 1.5 CAL/FIBER)  720 mL Per Tube Q24H  . feeding supplement (PROSource TF)  45 mL Per Tube Daily  . free water  150 mL Per Tube Q4H  . gabapentin  100 mg Per Tube Q8H  . heparin injection (subcutaneous)  5,000 Units Subcutaneous Q8H  . mouth rinse  15 mL Mouth Rinse q12n4p  . melatonin  5 mg Oral QHS  . metoprolol tartrate  12.5 mg Oral BID  . mirtazapine  30 mg  Per Tube QHS  . nicotine  14 mg Transdermal Daily  . revefenacin  175 mcg Nebulization Daily  . sodium chloride flush  10-40 mL Intracatheter Q12H   Continuous Infusions: . sodium chloride 10 mL/hr at 10/19/20 1500  . sodium chloride 50 mL/hr at 10/27/20 0102  . ampicillin-sulbactam (UNASYN) IV 3 g (10/27/20 0716)  . dexamethasone (DECADRON) IVPB ([facility])    . dexamethasone (DECADRON) IVPB ([facility])       LOS: 17 days    Time spent:40 min    Rilan Eiland, Geraldo Docker, MD [facility]   If 7PM-7AM, please contact night-coverage 10/27/2020, 1:55 PM

## 2020-10-27 NOTE — Progress Notes (Signed)
Physical Therapy Treatment Patient Details Name: [name] MRN: [id number] DOB: 03-02-1951 Today's Date: 10/27/2020    History of Present Illness Pt is 70 yo admitted 3/30 after fall and found by brother with unknown down time (1-3 days). Fractured L frontal sinus,L L3 transverse process, displaced angulated mildly comminuted L femoral neck fx. AKI with rhabdo. 3/31 pt with hypoxia requiring intubation, extubated 4/1. Pt s/p left anterior hip hemiarthroplasty 4/1. Re-intubated 4/4 for hypoxemia and transfered to ICU, extubated 10/17/20. 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 (See BPs below). Upon further assessment and questioning, noted pt with signs of a central vertigo (see Vestibular Assessment below). MD made aware and plans to order an MRI.     Seated BP 153/59, Seated after transfer 176/58 Seated after 10 minutes  163/52    Follow Up Recommendations  SNF;Supervision/Assistance - 24 hour     Equipment Recommendations  Rolling walker with 5" wheels;3in1 (PT)    Recommendations for Other Services       Precautions / Restrictions Precautions Precautions: Fall Precaution Comments: Cortrak, O2; orthostasis Restrictions LLE Weight Bearing: Weight bearing as tolerated Other Position/Activity Restrictions: s/p L hemi arthroplasty, anterior approach, no precautions    Vestibular Assessment          Mobility  Noted ?nystagmus as pt scanning--pt reported he sometimes sees things moving side to side and then stated it makes him feel dizzy. Assessed smooth pursuits and noted saccadic movement of bil eyes in each direction. Assessed saccades and noted undershooting requiring 2-3 "hops" with eyes to get to target. With assessment pt reported increasing "dizziness" that he describes as  "lightheaded." Assessed coordination and noted decr coordination UEs and LEs. MD and RN made aware. Pt reports symptoms x 2-3 days.    Bed Mobility Overal bed mobility: Needs Assistance Bed Mobility: Rolling;Sidelying to Sit Rolling: Min guard Sidelying to sit: Min assist       General bed mobility comments: Verbal/tactile cues for technique. Assist to elevate trunk into sitting    Transfers Overall transfer level: Needs assistance Equipment used: Rolling walker (2 wheeled) Transfers: Sit to/from [facility] Sit to Stand: Min assist;+2 safety/equipment Stand pivot transfers: +2 physical assistance;Min assist;+2 safety/equipment       General transfer comment: Assist to bring hips up and for balance. Small pivotal steps bed to chair  Ambulation/Gait             General Gait Details: pivotal steps only due to incr dizziness; was not orthostatic   Stairs             Wheelchair Mobility    Modified Rankin (Stroke Patients Only)       Balance Overall balance assessment: Needs assistance Sitting-balance support: Feet supported;Bilateral upper extremity supported Sitting balance-Leahy Scale: Poor Sitting balance - Comments: min assist with Rt hand slipping off EOB Postural control: Right lateral lean Standing balance support: Bilateral upper extremity supported Standing balance-Leahy Scale: Poor Standing balance comment: walker and +2 min assist for static standing                            Cognition Arousal/Alertness: Awake/alert Behavior During Therapy: WFL for tasks assessed/performed Overall Cognitive Status: Impaired/Different from baseline Area of Impairment: Memory;Following commands;Safety/judgement;Problem solving  Orientation Level:  (NT) Current Attention Level: Sustained Memory: Decreased short-term memory Following Commands: Follows one step commands consistently;Follows multi-step commands  inconsistently Safety/Judgement: Decreased awareness of safety;Decreased awareness of deficits   Problem Solving: Slow processing;Requires tactile cues;Requires verbal cues        Exercises      General Comments General comments (skin integrity, edema, etc.): Noted ?nystagmus as pt scanning--pt reported he sometimes sees things moving side to side and then stated it makes him feel dizzy. Assessed smooth pursuits and noted saccadic movement of bil eyes in each direction. Assessed saccades and noted undershooting requiring 2-3 "hops" with eyes to get to target. With assessment pt reported increasing "dizziness" that he describes as "lightheaded." Assessed coordination and noted decr coordination UEs and LEs. MD and RN made aware. Pt reports symptoms x 2-3 days.      Pertinent Vitals/Pain Pain Assessment: No/denies pain Faces Pain Scale: No hurt    Home Living                      Prior Function            PT Goals (current goals can now be found in the care plan section) Acute Rehab PT Goals Patient Stated Goal: to go home PT Goal Formulation: With patient/family Time For Goal Achievement: 11/10/20 Potential to Achieve Goals: Fair Progress towards PT goals:  (goals updated due to timeframe)    Frequency    Min 3X/week      PT Plan Current plan remains appropriate    Co-evaluation              AM-PAC PT "6 Clicks" Mobility   Outcome Measure  Help needed turning from your back to your side while in a flat bed without using bedrails?: A Little Help needed moving from lying on your back to sitting on the side of a flat bed without using bedrails?: A Little Help needed moving to and from a bed to a chair (including a wheelchair)?: Total Help needed standing up from a chair using your arms (e.g., wheelchair or bedside chair)?: A Lot Help needed to walk in hospital room?: Total Help needed climbing 3-5 steps with a railing? : Total 6 Click Score: 11     End of Session Equipment Utilized During Treatment: Gait belt Activity Tolerance: Treatment limited secondary to medical complications (Comment) (limited by dizziness) Patient left: with call bell/phone within reach;in chair;with chair alarm set Nurse Communication: Mobility status;Other (comment) (?central vertigo) PT Visit Diagnosis: Other abnormalities of gait and mobility (R26.89);Difficulty in walking, not elsewhere classified (R26.2);Muscle weakness (generalized) (M62.81)     Time: 2956-2130 PT Time Calculation (min) (ACUTE ONLY): 44 min  Charges:  $Therapeutic Activity: 23-37 mins                      Arby Barrette, PT Pager [phone]    Rexanne Mano 10/27/2020, 10:41 AM

## 2020-10-27 NOTE — Progress Notes (Signed)
Patient ID: [name], male   DOB: July 20, 1950, 70 y.o.   MRN: [id number] BP (!) 171/59 (BP Location: Right Arm)   Pulse 75   Temp 97.9 F (36.6 C) (Oral)   Resp 18   Ht 5\' 9"  (1.753 m)   Wt 64.3 kg   SpO2 95%   BMI 20.93 kg/m  Secondary to gait difficulties MRI brain ordered. Mri revealed multiple metastatic lesions in the brain, including in the cerebellum. Small cell CA classically treated with radiation, however given location in cerebellum RAD onc may want protection of a ventricular catheter prior to treatment. Will await there review, and will discuss with them.

## 2020-10-27 NOTE — Plan of Care (Signed)
  Problem: Education: Goal: Verbalization of understanding the information provided (i.e., activity precautions, restrictions, etc) will improve Outcome: Progressing Goal: Individualized Educational Video(s) Outcome: Progressing   Problem: Activity: Goal: Ability to ambulate and perform ADLs will improve Outcome: Progressing   Problem: Clinical Measurements: Goal: Postoperative complications will be avoided or minimized Outcome: Progressing   Problem: Self-Concept: Goal: Ability to maintain and perform role responsibilities to the fullest extent possible will improve Outcome: Progressing   Problem: Pain Management: Goal: Pain level will decrease Outcome: Progressing   Problem: Education: Goal: Knowledge of General Education information will improve Description: Including pain rating scale, medication(s)/side effects and non-pharmacologic comfort measures Outcome: Progressing   Problem: Health Behavior/Discharge Planning: Goal: Ability to manage health-related needs will improve Outcome: Progressing   Problem: Clinical Measurements: Goal: Ability to maintain clinical measurements within normal limits will improve Outcome: Progressing Goal: Will remain free from infection Outcome: Progressing Goal: Diagnostic test results will improve Outcome: Progressing Goal: Respiratory complications will improve Outcome: Progressing Goal: Cardiovascular complication will be avoided Outcome: Progressing   Problem: Activity: Goal: Risk for activity intolerance will decrease Outcome: Progressing   Problem: Nutrition: Goal: Adequate nutrition will be maintained Outcome: Progressing   Problem: Coping: Goal: Level of anxiety will decrease Outcome: Progressing   Problem: Elimination: Goal: Will not experience complications related to bowel motility Outcome: Progressing Goal: Will not experience complications related to urinary retention Outcome: Progressing   Problem: Pain  Managment: Goal: General experience of comfort will improve Outcome: Progressing   Problem: Safety: Goal: Ability to remain free from injury will improve Outcome: Progressing   Problem: Skin Integrity: Goal: Risk for impaired skin integrity will decrease Outcome: Progressing   Problem: Safety: Goal: Non-violent Restraint(s) Outcome: Progressing

## 2020-10-27 NOTE — Progress Notes (Signed)
Noted pt. Pulling IV tubing and pt pulled out port access. IV team consulted. MD made aware. Noted multiple times pt attempting to pull Cortack. New order for bilateral wrist restraints.

## 2020-10-27 NOTE — Progress Notes (Signed)
Progress Note:      Patient transferred from [facility] this evening to begin urgent radiation therapy tomorrow morning.  Plan on bringing the patient down for consultation planning and his first treatment in am.  He will receive a total of 10 treatments.  He will receive his treatments as an inpatient until he is medically stable for discharge home or to a nursing facility.  Full consult note to follow tomorrow.  Blair Promise, MD [phone] (beeper)

## 2020-10-27 NOTE — Progress Notes (Signed)
Pt noted confused keeps repeating "rabbit in [place]". When asked any question pt. Keeps repeating same phrase. MD made aware and said it due to high dose steroid.

## 2020-10-28 ENCOUNTER — Ambulatory Visit
Admit: 2020-10-28 | Discharge: 2020-10-28 | Disposition: A | Discharge status: Home / Self Care | Payer: Medicare HMO | Attending: Radiation Oncology | Admitting: Radiation Oncology

## 2020-10-28 ENCOUNTER — Encounter: Payer: Self-pay | Admitting: Radiation Oncology

## 2020-10-28 ENCOUNTER — Ambulatory Visit: Payer: Medicare HMO

## 2020-10-28 DIAGNOSIS — L89156 Pressure-induced deep tissue damage of sacral region: Secondary | ICD-10-CM | POA: Diagnosis not present

## 2020-10-28 DIAGNOSIS — C7931 Secondary malignant neoplasm of brain: Secondary | ICD-10-CM

## 2020-10-28 DIAGNOSIS — L89159 Pressure ulcer of sacral region, unspecified stage: Secondary | ICD-10-CM | POA: Diagnosis not present

## 2020-10-28 DIAGNOSIS — Z7189 Other specified counseling: Secondary | ICD-10-CM | POA: Diagnosis not present

## 2020-10-28 DIAGNOSIS — E43 Unspecified severe protein-calorie malnutrition: Secondary | ICD-10-CM | POA: Diagnosis not present

## 2020-10-28 DIAGNOSIS — Z515 Encounter for palliative care: Secondary | ICD-10-CM | POA: Diagnosis not present

## 2020-10-28 DIAGNOSIS — C3490 Malignant neoplasm of unspecified part of unspecified bronchus or lung: Secondary | ICD-10-CM | POA: Diagnosis not present

## 2020-10-28 DIAGNOSIS — R531 Weakness: Secondary | ICD-10-CM | POA: Diagnosis not present

## 2020-10-28 LAB — CBC WITH DIFFERENTIAL/PLATELET
Abs Immature Granulocytes: 0.04 10*3/uL (ref 0.00–0.07)
Basophils Absolute: 0 10*3/uL (ref 0.0–0.1)
Basophils Relative: 0 %
Eosinophils Absolute: 0 10*3/uL (ref 0.0–0.5)
Eosinophils Relative: 0 %
HCT: 28.4 % — ABNORMAL LOW (ref 39.0–52.0)
Hemoglobin: 8.9 g/dL — ABNORMAL LOW (ref 13.0–17.0)
Immature Granulocytes: 1 %
Lymphocytes Relative: 6 %
Lymphs Abs: 0.3 10*3/uL — ABNORMAL LOW (ref 0.7–4.0)
MCH: 30.5 pg (ref 26.0–34.0)
MCHC: 31.3 g/dL (ref 30.0–36.0)
MCV: 97.3 fL (ref 80.0–100.0)
Monocytes Absolute: 0.2 10*3/uL (ref 0.1–1.0)
Monocytes Relative: 3 %
Neutro Abs: 5.4 10*3/uL (ref 1.7–7.7)
Neutrophils Relative %: 90 %
Platelets: 328 10*3/uL (ref 150–400)
RBC: 2.92 MIL/uL — ABNORMAL LOW (ref 4.22–5.81)
RDW: 18.6 % — ABNORMAL HIGH (ref 11.5–15.5)
WBC: 6 10*3/uL (ref 4.0–10.5)
nRBC: 0 % (ref 0.0–0.2)

## 2020-10-28 LAB — GLUCOSE, CAPILLARY
Glucose-Capillary: 122 mg/dL — ABNORMAL HIGH (ref 70–99)
Glucose-Capillary: 128 mg/dL — ABNORMAL HIGH (ref 70–99)
Glucose-Capillary: 137 mg/dL — ABNORMAL HIGH (ref 70–99)
Glucose-Capillary: 139 mg/dL — ABNORMAL HIGH (ref 70–99)
Glucose-Capillary: 145 mg/dL — ABNORMAL HIGH (ref 70–99)
Glucose-Capillary: 152 mg/dL — ABNORMAL HIGH (ref 70–99)

## 2020-10-28 LAB — PHOSPHORUS: Phosphorus: 3.1 mg/dL (ref 2.5–4.6)

## 2020-10-28 LAB — MAGNESIUM: Magnesium: 2 mg/dL (ref 1.7–2.4)

## 2020-10-28 MED ORDER — DEXAMETHASONE SODIUM PHOSPHATE 4 MG/ML IJ SOLN
4.0000 mg | Freq: Four times a day (QID) | INTRAMUSCULAR | Status: DC
  Administered 2020-10-28 – 2020-11-07 (×38): 4 mg via INTRAVENOUS
  Filled 2020-10-28 (×40): qty 1

## 2020-10-28 MED ORDER — JEVITY 1.5 CAL/FIBER PO LIQD
1000.0000 mL | ORAL | Status: DC
  Administered 2020-10-28: 1000 mL
  Filled 2020-10-28 (×2): qty 1000

## 2020-10-28 MED ORDER — LIP MEDEX EX OINT
TOPICAL_OINTMENT | CUTANEOUS | Status: AC
  Filled 2020-10-28: qty 7

## 2020-10-28 NOTE — Progress Notes (Signed)
  Radiation Oncology         [phone] ________________________________  Name: [name] MRN: [id number]  Date: 10/28/2020  DOB: 1950/12/26  SIMULATION AND TREATMENT PLANNING NOTE    ICD-10-CM   1. 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  Identity was confirmed.  All relevant records and images related to the planned course of therapy were reviewed.  The patient freely provided informed written consent to proceed with treatment after reviewing the details related to the planned course of therapy. The consent form was witnessed and verified by the simulation staff.  Then, the patient was set-up in a stable reproducible  supine position for radiation therapy.  CT images were obtained.  Surface markings were placed.  The CT images were loaded into the planning software.  Then the target and avoidance structures were contoured.  Treatment planning then occurred.  The radiation prescription was entered and confirmed.  Then, I designed and supervised the construction of a total of 1 medically necessary complex treatment devices.  I have requested : Isodose Plan.   PLAN:  The patient will receive 30 Gy in 10 fractions directed at the whole brain.  First treatment this morning.  -----------------------------------  Blair Promise, PhD, MD

## 2020-10-28 NOTE — Progress Notes (Signed)
Foley catheter removed at approximately 11 a.m., patient has not voided since removal. Bladder scan performed, read as 140 mls in bladder.  Patient states he has no urge to void.  Condom cath in place.  Will continue to monitor for retention.

## 2020-10-28 NOTE — Progress Notes (Signed)
Daily Progress Note   Patient Name: [name]       Date: 10/28/2020 DOB: [birth year]  Age: 70 y.o. MRN#: [id number] Attending Physician: Little Ishikawa, MD Primary Care Physician: [name], MD Admit Date: 10/10/2020  Reason for Consultation/Follow-up: Establishing goals of care  Subjective: Patient has been transferred to [facility] to have radiation, he is awake alert resting in bed, he has NGT for tube feeds, complains of some mild back discomfort, patient went for radiation 1 st dose today.     ROS  Length of Stay: 18  Current Medications: Scheduled Meds:  . arformoterol  15 mcg Nebulization BID  . aspirin  81 mg Per Tube Daily  . chlorhexidine gluconate (MEDLINE KIT)  15 mL Mouth Rinse BID  . Chlorhexidine Gluconate Cloth  6 each Topical Daily  . dexamethasone (DECADRON) injection  4 mg Intravenous Q6H  . diphenoxylate-atropine  5 mL Oral Daily  . doxazosin  2 mg Per Tube Daily  . famotidine  20 mg Per Tube BID  . feeding supplement (JEVITY 1.5 CAL/FIBER)  720 mL Per Tube Q24H  . feeding supplement (PROSource TF)  45 mL Per Tube Daily  . free water  150 mL Per Tube Q4H  . gabapentin  100 mg Per Tube Q8H  . heparin injection (subcutaneous)  5,000 Units Subcutaneous Q8H  . mouth rinse  15 mL Mouth Rinse q12n4p  . melatonin  5 mg Oral QHS  . metoprolol tartrate  12.5 mg Oral BID  . mirtazapine  30 mg Per Tube QHS  . nicotine  14 mg Transdermal Daily  . revefenacin  175 mcg Nebulization Daily  . sodium chloride flush  10-40 mL Intracatheter Q12H    Continuous Infusions: . sodium chloride Stopped (10/25/20 0829)  . sodium chloride 50 mL/hr at 10/28/20 0625  . ampicillin-sulbactam (UNASYN) IV 3 g (10/28/20 6606)    PRN Meds: sodium chloride,  acetaminophen **OR** acetaminophen, albuterol, ALPRAZolam, hydrALAZINE, insulin aspart, ondansetron (ZOFRAN) IV, Resource ThickenUp Clear, sodium chloride flush  Physical Exam         Awake alert Is thin and has muscle wasting Has NGT Regular work of breathing S 1 S 2  Abdomen not distended No edema  Vital Signs: BP (!) 150/85 (BP Location: Right Arm)   Pulse 93  Temp 98 F (36.7 C) (Oral)   Resp 18   Ht 5' 9"  (1.753 m)   Wt 61.6 kg   SpO2 90%   BMI 20.05 kg/m  SpO2: SpO2: 90 % O2 Device: O2 Device: Nasal Cannula O2 Flow Rate: O2 Flow Rate (L/min): 2 L/min  Intake/output summary:   Intake/Output Summary (Last 24 hours) at 10/28/2020 1236 Last data filed at 10/28/2020 2229 Gross per 24 hour  Intake 1288.83 ml  Output 3475 ml  Net -2186.17 ml   LBM: Last BM Date: 10/26/20 Baseline Weight: Weight: 66.7 kg Most recent weight: Weight: 61.6 kg       Palliative Assessment/Data: PPS: 50%      Patient Active Problem List   Diagnosis Date Noted  . Primary malignant neoplasm of lung with metastasis to brain ([place]) 10/27/2020  . Essential hypertension 10/27/2020  . Thrombocytopenia ([place]) 10/27/2020  . Tobacco abuse 10/27/2020  . Sacral pressure ulcer 10/25/2020  . Orthostatic hypotension 10/25/2020  . Oropharyngeal dysphagia   . Status post total replacement of left hip   . Non-traumatic rhabdomyolysis   . Fall   . Left-sided weakness   . Acute respiratory failure with hypoxia ([place])   . Protein-calorie malnutrition, severe 10/16/2020  . Closed fracture of left hip ([place])   . Pressure injury of skin 10/11/2020  . Sepsis without septic shock ([place])   . Left displaced femoral neck fracture ([place])   . Delirium   . Closed fracture of frontal sinus ([place])   . Closed fracture of transverse process of lumbar vertebra ([place])   . Goals of care, counseling/discussion 12/19/2019  . Iron deficiency anemia 09/12/2019  . Small cell lung cancer in adult [facility]) 02/05/2017  .  Lymphadenopathy of head and neck 01/22/2017  . Osteopenia 03/30/2015  . Clubbing of fingers 04/14/2014  . Hyperglycemia 10/20/2013  . Smoker   . Closed dislocation of acromioclavicular joint 04/11/2010  . BREAST MASS, LEFT 01/20/2008  . ONYCHOMYCOSIS, TOENAILS 10/20/2007  . MUSCLE CRAMPS 10/20/2007  . Hypercholesterolemia 05/28/2007  . CATARACT NOS 08/28/2006  . DISTURBANCE, VISUAL NOS 08/19/2006  . COPD (chronic obstructive pulmonary disease) with emphysema ([place]) 08/19/2006  . WITHDRAWAL, DRUG 06/24/2006  . ANXIETY 06/24/2006  . DEPRESSION 06/24/2006  . MYOCARDIAL INFARCTION, HX OF 06/24/2006  . Coronary atherosclerosis 06/24/2006  . CARDIAC ARRHYTHMIA 06/24/2006  . CONSTIPATION 06/24/2006  . OSTEOARTHRITIS 06/24/2006  . LOW BACK PAIN 06/24/2006  . 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 He remains confused. Has significant dysphagia and is currently NPO with coretrak in place. Palliative medicine consulted for goals of care for this very ill man with multiple comorbidities who is at high risk of decompensation and dying.  Assessment/Recommendations/Plan  Continue full scope/full code PMT will follow peripherally/periodically check in to see how his radiation is going,and to assess his overall disease trajectory of illness. Otherwise, recommend continued followup by outpatient Palliative upon discharge.   Goals of Care and Additional Recommendations: Limitations on Scope of Treatment: Full Scope Treatment  Code Status: Full code  Prognosis:   Unable to determine  Discharge Planning: To Be Determined  Care plan was discussed  with patient.   Thank you for allowing the Palliative Medicine Team to assist in the care of this patient.   Total time: 25 mins Greater than 50%  of this time was spent counseling and coordinating care related to the above assessment and plan.  Loistine Chance MD.  Palliative Medicine   Please contact Palliative Medicine Team phone at [phone] for questions and concerns.

## 2020-10-28 NOTE — Progress Notes (Signed)
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 Admitted to [facility] on 3/30 for encephalopathy following a fall with associated facial fractures.  Patient was found by brother on 3/30 after he had been unable to reach patient for unknown period of time (1 to 3 days suspected).  Patient fracture left frontal sinus, L3 transverse process, displaced angulated mildly comminuted left femoral neck fracture.  AKI with rhabdomyolysis. On 3/31 patient develop worsening hypoxia and respiratory distress, intubated.  Subsequently extubated 4/2 and transferred out of the ICU on 4/3.  The evening of 4/3--4/4 he developed increased work of breathing and increased oxygen needs requiring intubation.  This was thought to be related to an aspiration event.   Subjective: No acute issues/events overnight - mental status minimally improving from previous reported baseline over the past few days but ROS severely limited.  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 [facility])   Left displaced femoral neck fracture ([place])   Closed fracture of transverse process of lumbar vertebra (HCC)   Pressure injury of skin   Closed fracture of left hip (HCC)   Protein-calorie malnutrition, severe   Status post total replacement of left hip   Non-traumatic rhabdomyolysis   Fall   Left-sided weakness   Acute respiratory failure with hypoxia (HCC)   Oropharyngeal dysphagia   Sacral pressure ulcer   Orthostatic hypotension   Primary malignant neoplasm of lung with metastasis to brain ([place])   Essential hypertension   Thrombocytopenia ([place])   Tobacco abuse  GOALS OF CARE -Consult to palliative for follow up in the next 24 - 48h; brother Levada Dy is very understanding of the patient's current situation  and prognosis. Lengthy talk today about goals of care including palliative/hospice. He is agreeable for home hospice pending the next few days' treatments and progression/improvement in the patients clinical situation. We discussed that should the patient remain altered/full care/difficult to care for at home that hospice house would certainly be worth discussing.  Acute respiratory failure with hypoxia/Aspiration pneumonia/mucus plugging/likely history of emphysema requiring intubation/PPV, Resolving - 4/5 Mucus Plug - 4/14 PCXR pending: Although patient does not feel SOB SPO2 drops with 4L nasal cannula on. Most likely secondary to patient being a mouth breather and having some mild obstruction with NG tube in place.   - Patient previously requiring Ventimask when sleeping given mouth breathing with NG tube in - follow clinically  Hx Small cell lung cancer  - S/p chemotherapy Lurbinectedin last cycle 09/12/20. - Extubated 4\6 - Completed 7 days of cefepime 4/05. - Brovana/Yupelri nebulizer treatments and PRN albuterol.  - received Solumedrol on 4/05 during mucus plug event, prednisone for COPD.  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 Delton Coombes with oncology and Dr. Sondra Come with rads oncology - now at [facility] for ongoing palliative radiation - Dr. Ashok Pall neurosurgery does not believe patient currently needs shunt however states if radiation oncology requires shunt placement he is available for placement.  Orthostatic hypotension - Albumin given previously with minimal improvement - TED hose 12 hours on/12 hours off - NS 50 ml/hr; Tube feeds  w/ free water flushes  Essential HTN/Ventricular tachycardia - Overnight reported patient had a couple runs of ventricular tachycardia in addition rise in BP.  - Most likely secondary to his multiple brain mets with  hydrocephalus - Allow permissive HTN - Metoprolol 12.5 mg BID - Hydralazine PRN SBP> 170 or DBP> 110  Nausea/vomiting - 4/14 resolved   Nutrition;  - On tube feeding.  Through core track. - Swallow evaluation was attempted again today but patient was keeping pured diet and his mouth, current recommendations remained to maintain n.p.o. and continue feeding through tube - If patient needs long-term tube feeding-we will order PEG tube with IR. - Speech placed him on dysphagia 2 diet with thick liquid after getting a barium swallow, please see the full report-remain high risk for aspiration.  Diarrhea;  - Resolved after stopping laxative and change in his tube feed formula.  Acute metabolic encephalopathy, worsening in the setting of above Cannot rule out acute hospital delirium.   - Likely in the setting of acute respiratory failure, due to aspiration pneumonia and ICU delirium. - Continue Gabapentin,dose was reduced to 100 mg 3 times daily from 300 mg. - 4/14 resolved answered all questions appropriately.   Dizziness - 4/16 patient reports new onset dizziness, when patient worked with PT, PT  Noticed eyes are "jumping" during smooth pursuits and during saccades. He reports sometimes objects look like they are moving back and forth and sometimes his hand is moving back and forth as he tries to reach for something -4/16 MRI brain pending: Positive for multiple mets to brain see results below  Left femoral Neck Fx S/P Hemiarthroplasty:  - Ortho following.  - PT following-recommending SNF placement.  Left Facial Fx, L 3 TP Fx:  - Neurosurgery  evaluated , no surgical intervention needed.  - Neurosurgery recommend ENT consult. - Spoke with ENT on called for [place] recommend Conservative management, Fracture is non displaced, does not need anything acute.   Rhabdomyolysis.   - Resolved with IV fluids.   Thrombocytopenia,  - Thrombocytopenia resolved   Normocytic Anemia  - 4/6  transfused 1 unit PRBC Lab Results  Component Value Date   HGB 9.0 (L) 10/27/2020   HGB 8.8 (L) 10/26/2020   HGB 10.2 (L) 10/25/2020   HGB 9.8 (L) 10/24/2020   HGB 9.6 (L) 10/23/2020  - 4/13 anemia consistent with normocytic anemia  Tobacco abuse -Needs counseling.   Sepsis was ruled out.   Sacral pressure ulcer, POA Pressure Injury 10/11/20 Sacrum Medial Deep Tissue Pressure Injury - Purple or maroon localized area of discolored intact skin or blood-filled blister due to damage of underlying soft tissue from pressure and/or shear. (Active)  10/11/20 0220  Location: Sacrum  Location Orientation: Medial  Staging: Deep Tissue Pressure Injury - Purple or maroon localized area of discolored intact skin or blood-filled blister due to damage of underlying soft tissue from pressure and/or shear.  Wound Description (Comments):   Present on Admission: Yes     Pressure Injury 10/15/20 Heel Right Deep Tissue Pressure Injury - Purple or maroon localized area of discolored intact skin or blood-filled blister due to damage of underlying soft tissue from pressure and/or shear. (Active)  10/15/20 0323  Location: Heel  Location Orientation: Right  Staging: Deep Tissue Pressure Injury - Purple or maroon localized area of discolored intact skin or blood-filled blister due to damage of underlying soft tissue from pressure and/or shear.  Wound Description (Comments):   Present on Admission: No  Pressure Injury 10/27/20 Knee Anterior;Right;Lateral Deep Tissue Pressure Injury - Purple or maroon localized area of discolored intact skin or blood-filled blister due to damage of underlying soft tissue from pressure and/or shear. (Active)  10/27/20 1941  Location: Knee  Location Orientation: Anterior;Right;Lateral  Staging: Deep Tissue Pressure Injury - Purple or maroon localized area of discolored intact skin or blood-filled blister due to damage of underlying soft tissue from pressure and/or shear.   Wound Description (Comments):   Present on Admission: Yes   Severe protein calorie malnutrition - Continue tube feeds  DVT prophylaxis: Subcu heparin Code Status: Full Family Communication: Spoke with Levada Dy local brother and discussed plan of care answered all questions  Status is: Inpatient  Dispo: The patient is from: Home              Anticipated d/c is to: Hospice at home vs hospice house              Anticipated d/c date is: 48/72h              Patient currently unstable  Consultants:  ENT Dr. Delton Coombes.[facility] oncology Dr. Ashok Pall neurosurgery Dr. Sondra Come radiation oncology    Procedures/Significant Events:  3/30 - admitted to [facility] for encephalopathy. Facial fx, L3 TP fx, Hip fx.  3/31 non op from [place] standpoint. Ortho planned to OR 3/31 however acute hypoxic respiratory failure, transferred to ICU and emergently intubated.  3/31 ETT >>4/1 3/31 bronchoscopy with BAL left lower lobe 4/1 LT Hip Hemiarthroplasty (Anterior Approach) 4/4 ETT>>4/6 4/12 Patient with worsening cough and leukocytosis, started on Unasyn for concern of aspiration.  4/16 MRI brain W/W0 contrast; -Positive for metastatic disease to the brain, with several large cerebellar metastases, up to 3.9 cm. -Subsequent severe cerebellar edema with partially effaced 4th ventricle and cerebral aqueduct, and associated hydrocephalus with mild transependymal edema.  -Four brain metastases overall, including a 13 mm right caudate lesion. Suspected cystic metastasis or ex-nodal disease which is enlarging at the left nasopharynx, fossa of Rosenmuller since February. In August last year this lesion was suspected to be cystic/necrotic lymphadenopathy by Neck CT.   I have personally reviewed and interpreted all radiology studies and my findings are as above.  Cultures 4/4 tracheal aspirate positive rare Candida Krusei  4/4 MRSA by PCR negative   Antimicrobials: Anti-infectives (From admission,  onward)   Start     Ordered Stop   10/23/20 1330  Ampicillin-Sulbactam (UNASYN) 3 g in sodium chloride 0.9 % 100 mL IVPB        10/23/20 1241 10/28/20 2359   10/15/20 2200  ceFEPIme (MAXIPIME) 2 g in sodium chloride 0.9 % 100 mL IVPB        10/15/20 1121 10/16/20 2332   10/15/20 0400  vancomycin (VANCOREADY) IVPB 1500 mg/300 mL  Status:  Discontinued        10/15/20 0300 10/15/20 1324   10/12/20 1254  vancomycin (VANCOCIN) powder  Status:  Discontinued        10/12/20 1255 10/12/20 1328   10/11/20 2100  vancomycin (VANCOREADY) IVPB 1250 mg/250 mL  Status:  Discontinued        10/10/20 2104 10/12/20 0834   10/11/20 1800  ceFEPIme (MAXIPIME) 2 g in sodium chloride 0.9 % 100 mL IVPB  Status:  Discontinued        10/11/20 1230 10/15/20 1121   10/11/20 1200  ceFAZolin (ANCEF) IVPB 2g/100 mL premix        10/11/20 1114 10/12/20 0559   10/10/20  2100  vancomycin (VANCOREADY) IVPB 1250 mg/250 mL        10/10/20 2058 10/10/20 2327   10/10/20 2100  ceFEPIme (MAXIPIME) 2 g in sodium chloride 0.9 % 100 mL IVPB  Status:  Discontinued        10/10/20 2058 10/11/20 1230      Continuous Infusions: . sodium chloride Stopped (10/25/20 0829)  . sodium chloride 50 mL/hr at 10/28/20 0625  . ampicillin-sulbactam (UNASYN) IV 3 g (10/28/20 3382)  . dexamethasone (DECADRON) IVPB ([facility]) Stopped (10/28/20 5053)   Objective: Vitals:   10/27/20 1949 10/28/20 0101 10/28/20 0422 10/28/20 0425  BP: (!) 129/58 (!) 123/56 (!) 127/55   Pulse: 92 88 83   Resp: _0 Temp: 97.6 F (36.4 C) 98.5 F (36.9 C) 98.3 F (36.8 C)   TempSrc: Oral Oral Oral   SpO2: 94% 91% (!) 89%   Weight:    61.6 kg  Height:        Intake/Output Summary (Last 24 hours) at 10/28/2020 0740 Last data filed at 10/28/2020 9767 Gross per 24 hour  Intake 1288.83 ml  Output 3275 ml  Net -1986.17 ml   Filed Weights   10/25/20 0500 10/27/20 0717 10/28/20 0425  Weight: 62.1 kg 64.3 kg 61.6 kg    Examination:  General: Alert to  person only Eyes: negative scleral hemorrhage, negative anisocoria, negative icterus ENT: Negative Runny nose, negative gingival bleeding, coretrak in place Neck:  Negative scars, masses, torticollis, lymphadenopathy, JVD Lungs: Clear to auscultation bilaterally without wheezes or crackles Cardiovascular: Regular rate and rhythm without murmur gallop or rub normal S1 and S2 Abdomen: negative abdominal pain, nondistended, positive soft, bowel sounds, no rebound, no ascites, no appreciable mass Extremities: No significant cyanosis, clubbing, or edema bilateral lower extremities Skin: Multiple facial bruises  Data Reviewed: Care during the described time interval was provided by me .  I have reviewed this patient's available data, including medical history, events of note, physical examination, and all test results as part of my evaluation.  CBC: Recent Labs  Lab 10/23/20 0435 10/24/20 0904 10/25/20 0149 10/26/20 0404 10/27/20 0122  WBC 11.1* 7.1 7.8 6.9 6.1  NEUTROABS  --  5.2 5.9 5.1 4.5  HGB 9.6* 9.8* 10.2* 8.8* 9.0*  HCT 29.0* 29.9* 30.9* 27.2* 27.8*  MCV 94.2 95.5 94.2 95.4 95.5  PLT 270 295 302 317 341   Basic Metabolic Panel: Recent Labs  Lab 10/24/20 0904 10/25/20 0149 10/26/20 0404 10/27/20 0122  NA 134* 136 137 138  K 4.3 4.2 4.2 4.5  CL 104 103 107 106  CO2 _1 GLUCOSE 99 118* 132* 124*  BUN _2 CREATININE 0.86 0.83 0.74 0.81  CALCIUM 8.5* 9.0 8.6* 8.9  MG 2.2 2.4 2.2 2.2  PHOS 4.2 4.1 4.0 3.9   GFR: Estimated Creatinine Clearance: 75 mL/min (by C-G formula based on SCr of 0.81 mg/dL). Liver Function Tests: Recent Labs  Lab 10/24/20 0904 10/25/20 0149 10/26/20 0404 10/27/20 0122  AST _3 ALT 44 40 26 22  ALKPHOS 72 73 63 63  BILITOT 0.8 0.4 0.3 0.3  PROT 5.8* 6.1* 5.7* 5.8*  ALBUMIN 2.7* 2.9* 2.9* 2.9*   No results for input(s): LIPASE, AMYLASE in the last 168 hours. No results for input(s): AMMONIA in the last 168  hours. Coagulation Profile: No results for input(s): INR, PROTIME in the last 168 hours. Cardiac Enzymes: No results for input(s): CKTOTAL, CKMB, CKMBINDEX,  TROPONINI in the last 168 hours. BNP (last 3 results) No results for input(s): PROBNP in the last 8760 hours. HbA1C: No results for input(s): HGBA1C in the last 72 hours. CBG: Recent Labs  Lab 10/27/20 1149 10/27/20 1558 10/27/20 2058 10/28/20 0058 10/28/20 0419  GLUCAP 106* 112* 111* 139* 137*   Lipid Profile: No results for input(s): CHOL, HDL, LDLCALC, TRIG, CHOLHDL, LDLDIRECT in the last 72 hours. Thyroid Function Tests: No results for input(s): TSH, T4TOTAL, FREET4, T3FREE, THYROIDAB in the last 72 hours. Anemia Panel: No results for input(s): VITAMINB12, FOLATE, FERRITIN, TIBC, IRON, RETICCTPCT in the last 72 hours. Sepsis Labs: Recent Labs  Lab 10/23/20 1245 10/25/20 1704 10/25/20 2229 10/26/20 0404 10/27/20 0122  PROCALCITON <0.10 <0.10  --  <0.10 <0.10  LATICACIDVEN  --  1.0 1.0  --   --     No results found for this or any previous visit (from the past 240 hour(s)).   Radiology Studies: MR BRAIN W WO CONTRAST  Result Date: 10/27/2020 CLINICAL DATA:  70 year old male with history of metastatic small cell carcinoma. Encephalopathy after fall last month. Rhabdomyolysis. Unexplained neurologic deficits, dizziness, double vision. Restaging. EXAM: MRI HEAD WITHOUT AND WITH CONTRAST TECHNIQUE: Multiplanar, multiecho pulse sequences of the brain and surrounding structures were obtained without and with intravenous contrast. CONTRAST:  6.80m GADAVIST GADOBUTROL 1 MMOL/ML IV SOLN COMPARISON:  Head CT without contrast 10/15/2020 and earlier. Brain MRI 09/07/2019. CTA head and neck 10/10/2020, and earlier. FINDINGS: Brain: Ventriculomegaly, new since last year with dilated temporal horns. Up to mild transependymal edema. No restricted diffusion or evidence of acute infarction. But there is abnormal diffusion associated  with multiple enhancing brain masses. The largest is a lobulated and heterogeneously enhancing 39 mm mass in the central lower cerebellum (series 11, image 12). There are 2 additional nearby cerebellar metastases, 23 mm in the left superior and up to 24 mm in the right superior cerebellum (same image). Subsequent severe cerebellar edema. Partially effaced 4th ventricle and basilar cisterns. Partially effaced cerebral aqueduct. No tonsillar herniation. Additional round 13 mm right caudate metastasis on series 10, image 27. mild edema at that site. Each of the lesions demonstrates mild hemosiderin. No supratentorial midline shift. No dural thickening. Superimposed widespread chronic cerebral white matter T2 and FLAIR hyperintensity. Superimposed scattered chronic microhemorrhages elsewhere in the brain, likely small vessel related and not significantly changed since last year allowing for SWI technique today. No extra-axial collection or acute intracranial hemorrhage. Pituitary within normal limits. Vascular: Major intracranial vascular flow voids are stable since last year. Skull and upper cervical spine: Negative visible upper cervical spine, spinal cord. Visualized bone marrow signal is within normal limits. Sinuses/Orbits: Postoperative changes to both globes. Orbits appear stable and negative. Normal suprasellar cistern. Normal cavernous sinus. Paranasal Visualized paranasal sinuses and mastoids are stable and well aerated. Other: Chronic left mastoid effusion has mildly increased. Rim enhancing multi cystic appearing mass at the left fossa of Rosenmuller is new since 2018, and significantly increased since last year now measuring up to the 3 cm (series 11, image 19). This does not seem to directly correspond to what were thought to be small benign midline nasopharyngeal cysts in 2018. And this has progressed from a neck CT 09/03/2020 Other visible upper neck soft tissues appear negative. IMPRESSION: 1. Positive  for metastatic disease to the brain, with several large cerebellar metastases, up to 3.9 cm. 2. Subsequent severe cerebellar edema with partially effaced 4th ventricle and cerebral aqueduct, and associated hydrocephalus with  mild transependymal edema. Recommend Neurosurgery consultation. 3. Four brain metastases overall, including a 13 mm right caudate lesion. 4. Suspected cystic metastasis or ex-nodal disease which is enlarging at the left nasopharynx, fossa of Rosenmuller since February. In August last year this lesion was suspected to be cystic/necrotic lymphadenopathy by Neck CT. Study discussed by telephone with Dr. Dia Crawford on 10/27/2020 at 11:35 . Electronically Signed   By: Genevie Ann M.D.   On: 10/27/2020 11:41    Scheduled Meds: . arformoterol  15 mcg Nebulization BID  . aspirin  81 mg Per Tube Daily  . chlorhexidine gluconate (MEDLINE KIT)  15 mL Mouth Rinse BID  . Chlorhexidine Gluconate Cloth  6 each Topical Daily  . diphenoxylate-atropine  5 mL Oral Daily  . doxazosin  2 mg Per Tube Daily  . famotidine  20 mg Per Tube BID  . feeding supplement (JEVITY 1.5 CAL/FIBER)  720 mL Per Tube Q24H  . feeding supplement (PROSource TF)  45 mL Per Tube Daily  . free water  150 mL Per Tube Q4H  . gabapentin  100 mg Per Tube Q8H  . heparin injection (subcutaneous)  5,000 Units Subcutaneous Q8H  . mouth rinse  15 mL Mouth Rinse q12n4p  . melatonin  5 mg Oral QHS  . metoprolol tartrate  12.5 mg Oral BID  . mirtazapine  30 mg Per Tube QHS  . nicotine  14 mg Transdermal Daily  . revefenacin  175 mcg Nebulization Daily  . sodium chloride flush  10-40 mL Intracatheter Q12H   Continuous Infusions: . sodium chloride Stopped (10/25/20 0829)  . sodium chloride 50 mL/hr at 10/28/20 0625  . ampicillin-sulbactam (UNASYN) IV 3 g (10/28/20 0981)  . dexamethasone (DECADRON) IVPB ([facility]) Stopped (10/28/20 1914)     LOS: 18 days    Time spent:40 min   Little Ishikawa, DO [facility]   If 7PM-7AM, please contact night-coverage 10/28/2020, 7:40 AM

## 2020-10-28 NOTE — Progress Notes (Signed)
Pt arrived on unit via [facility]. Port was de-accessed prior to transfer from [facility].

## 2020-10-28 NOTE — Progress Notes (Signed)
Radiation Oncology         [phone] ________________________________  Initial inpatient consultation  Name: [name] MRN: [id number]  Date: 10/28/2020  DOB: 08/06/1950  IR:CVELFYB, Cammie Mcgee, MD  No ref. provider found   REFERRING PHYSICIAN: No ref. 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 male who was admitted to the hospital several days ago after recent falls. While in the hospital the patient has been noted to be confused, unexplained neurological deficits as well as dizziness and double vision.  MRI of the brain yesterday showed 4 brain metastasis with the largest being 3.9 cm in the cerebellar region.  There is also noted to be severe cerebellar edema with partially effacement of the fourth ventricle and associated hydrocephalus with mild transependymal edema.  Neurosurgery was consulted.  Medical oncology was also consulted.  The patient was placed on loading dose of IV Decadron 10 mg and then 4 mg every 6 hours.  Yesterday the patient's mental status showed some improvement.  He was subsequently transferred to [facility] to start urgent treatment. If the patient's mental status deteriorates again he may need a ventricular catheter.  PREVIOUS RADIATION THERAPY: Yes patient reports having radiation treatments to his left shoulder but he is somewhat confused and this is not verified.  He has had no previous radiation therapy within the [facility] health system.  PAST MEDICAL HISTORY:  has a past medical history of Anxiety, CAD (coronary artery disease), Cancer ([place]), COPD (chronic obstructive pulmonary disease) ([place]), Depression, Dyspnea, Feeling of chest tightness, Heart palpitations, History of chemotherapy, Myocardial infarction ([place]), Osteopenia, Panic attacks, and Smoker.    PAST SURGICAL HISTORY: Past Surgical History:  Procedure Laterality Date  . BACK  SURGERY  12/24/2000   L5,S1  . CORONARY STENT PLACEMENT  2005   RCA & CX  . HERNIA REPAIR Right 1980's  . INGUINAL HERNIA REPAIR  12/1978   right side  . IR FLUORO GUIDE PORT INSERTION RIGHT  04/02/2017  . IR US GUIDE BX ASP/DRAIN  02/03/2017  . IR US GUIDE VASC ACCESS RIGHT  04/02/2017  . NM MYOCAR PERF WALL MOTION  09/07/2009   No ischemia; EF 51%  . SHOULDER SURGERY Left 08/2010  . SPINE SURGERY  2002   L5-S1  . TOTAL HIP ARTHROPLASTY Left 10/12/2020   Procedure: TOTALhemi  HIP ARTHROPLASTY ANTERIOR APPROACH;  Surgeon: [name], MD;  Location: [facility];  Service: Orthopedics;  Laterality: Left;    FAMILY HISTORY: family history includes Alcohol abuse in his brother; Cancer in his brother; Diabetes in his brother and sister; Heart attack in his father and mother; Heart failure in his mother; Kidney disease in his father.  SOCIAL HISTORY:  reports that he has been smoking cigarettes. He has been smoking about 1.00 pack per day. He has never used smokeless tobacco. He reports current alcohol use. He reports that he does not use drugs.  ALLERGIES: Codeine and Niaspan [niacin er]  MEDICATIONS:  No current facility-administered medications for this visit.   Current Outpatient Medications  Medication Sig Dispense Refill  . enoxaparin (LOVENOX) 40 MG/0.4ML injection Inject 0.4 mLs (40 mg total) into the skin daily for 14 days. 5.6 mL 0  . oxyCODONE-acetaminophen (PERCOCET) 5-325 MG tablet Take 1-2 tablets by mouth every 8 (eight) hours as needed for severe pain. 30 tablet 0   Facility-Administered Medications Ordered in Other Visits  Medication Dose Route Frequency Provider Last Rate Last Admin  . 0.9 %  sodium chloride infusion   Intravenous PRN Freddi Starr, MD   Stopped at 10/25/20 [phone]  . 0.9 %  sodium chloride infusion   Intravenous Continuous Allie Bossier, MD 50 mL/hr at 10/28/20 5916 Infusion Verify at 10/28/20 0625  . acetaminophen (TYLENOL) tablet 650 mg  650 mg Per Tube  Q6H PRN Freddi Starr, MD   650 mg at 10/26/20 0411   Or  . acetaminophen (TYLENOL) suppository 650 mg  650 mg Rectal Q6H PRN Freddi Starr, MD      . albuterol (PROVENTIL) (2.5 MG/3ML) 0.083% nebulizer solution 2.5 mg  2.5 mg Nebulization Q2H PRN Rigoberto Noel, MD   2.5 mg at 10/16/20 1343  . ALPRAZolam Duanne Moron) tablet 0.5 mg  0.5 mg Per Tube TID PRN Lorella Nimrod, MD   0.5 mg at 10/27/20 1526  . Ampicillin-Sulbactam (UNASYN) 3 g in sodium chloride 0.9 % 100 mL IVPB  3 g Intravenous Q6H Allie Bossier, MD 200 mL/hr at 10/28/20 0633 3 g at 10/28/20 3846  . arformoterol (BROVANA) nebulizer solution 15 mcg  15 mcg Nebulization BID Freddi Starr, MD   15 mcg at 10/28/20 0915  . aspirin chewable tablet 81 mg  81 mg Per Tube Daily Freddi Starr, MD   81 mg at 10/27/20 0802  . chlorhexidine gluconate (MEDLINE KIT) (PERIDEX) 0.12 % solution 15 mL  15 mL Mouth Rinse BID Collier Bullock, MD   15 mL at 10/27/20 0808  . Chlorhexidine Gluconate Cloth 2 % PADS 6 each  6 each Topical Daily Rigoberto Noel, MD   6 each at 10/27/20 1154  . dexamethasone (DECADRON) injection 4 mg  4 mg Intravenous Q6H Little Ishikawa, MD      . diphenoxylate-atropine (LOMOTIL) 2.5-0.025 MG/5ML liquid 5 mL  5 mL Oral Daily Erin Hearing L, NP   5 mL at 10/27/20 1154  . doxazosin (CARDURA) tablet 2 mg  2 mg Per Tube Daily Freddi Starr, MD   2 mg at 10/27/20 0803  . famotidine (PEPCID) 40 MG/5ML suspension 20 mg  20 mg Per Tube BID Freddi Starr, MD   20 mg at 10/27/20 2127  . feeding supplement (JEVITY 1.5 CAL/FIBER) liquid 720 mL  720 mL Per Tube Q24H Samella Parr, NP   720 mL at 10/27/20 2145  . feeding supplement (PROSource TF) liquid 45 mL  45 mL Per Tube Daily Samella Parr, NP   45 mL at 10/27/20 0801  . free water 150 mL  150 mL Per Tube Q4H Regalado, Belkys A, MD   150 mL at 10/28/20 0400  . gabapentin (NEURONTIN) 250 MG/5ML solution 100 mg  100 mg Per Tube Q8H Freda Jackson B,  MD   100 mg at 10/28/20 0610  . heparin injection 5,000 Units  5,000 Units Subcutaneous Q8H Freddi Starr, MD   5,000 Units at 10/28/20 6599  . hydrALAZINE (APRESOLINE) injection 5 mg  5 mg Intravenous Q4H PRN Allie Bossier, MD      . insulin aspart (novoLOG) injection 0-15 Units  0-15 Units Subcutaneous Q4H PRN Allie Bossier, MD      . MEDLINE mouth rinse  15 mL Mouth Rinse q12n4p Freddi Starr, MD   15 mL at 10/27/20 1711  . melatonin tablet 5 mg  5 mg Oral QHS Lorella Nimrod, MD   5 mg at 10/26/20 2210  .  metoprolol tartrate (LOPRESSOR) tablet 12.5 mg  12.5 mg Oral BID Allie Bossier, MD   12.5 mg at 10/27/20 2129  . mirtazapine (REMERON) tablet 30 mg  30 mg Per Tube QHS Freda Jackson B, MD   30 mg at 10/27/20 2129  . nicotine (NICODERM CQ - dosed in mg/24 hours) patch 14 mg  14 mg Transdermal Daily Cecilie Lowers T, MD   14 mg at 10/27/20 0938  . ondansetron (ZOFRAN) injection 4 mg  4 mg Intravenous Q8H PRN Frederik Pear, MD      . Resource ThickenUp Clear   Oral PRN Lorella Nimrod, MD      . revefenacin (YUPELRI) nebulizer solution 175 mcg  175 mcg Nebulization Daily Freddi Starr, MD   175 mcg at 10/27/20 0839  . sodium chloride flush (NS) 0.9 % injection 10-40 mL  10-40 mL Intracatheter Q12H Rigoberto Noel, MD   10 mL at 10/27/20 2128  . sodium chloride flush (NS) 0.9 % injection 10-40 mL  10-40 mL Intracatheter PRN Rigoberto Noel, MD        REVIEW OF SYSTEMS:  REVIEW OF SYSTEMS: A 10+ POINT REVIEW OF SYSTEMS WAS OBTAINED including neurology, dermatology, psychiatry, cardiac, respiratory, lymph, extremities, GI, GU, musculoskeletal, constitutional, reproductive, HEENT. All pertinent positives are noted in the HPI.  The patient currently denies any headaches this morning.  PHYSICAL EXAM: Patient is lying comfortably in his hospital bed.  Oxygen in place by nasal cannula.  The patient has a NG tube in place.  He does respond appropriately to questions.  Spontaneous  movement of all 4 extremities.  ECOG = 4  0 - Asymptomatic (Fully active, able to carry on all predisease activities without restriction)  1 - Symptomatic but completely ambulatory (Restricted in physically strenuous activity but ambulatory and able to carry out work of a light or sedentary nature. For example, light housework, office work)  2 - Symptomatic, <50% in bed during the day (Ambulatory and capable of all self care but unable to carry out any work activities. Up and about more than 50% of waking hours)  3 - Symptomatic, >50% in bed, but not bedbound (Capable of only limited self-care, confined to bed or chair 50% or more of waking hours)  4 - Bedbound (Completely disabled. Cannot carry on any self-care. Totally confined to bed or chair)  5 - Death   Eustace Pen MM, Creech RH, Tormey DC, et al. [phone]). "Toxicity and response criteria of the [facility]". [place] Oncol. 5 (6): 649-55  LABORATORY DATA:  Lab Results  Component Value Date   WBC 6.0 10/28/2020   HGB 8.9 (L) 10/28/2020   HCT 28.4 (L) 10/28/2020   MCV 97.3 10/28/2020   PLT 328 10/28/2020   NEUTROABS 5.4 10/28/2020   Lab Results  Component Value Date   NA 138 10/27/2020   K 4.5 10/27/2020   CL 106 10/27/2020   CO2 25 10/27/2020   GLUCOSE 124 (H) 10/27/2020   CREATININE 0.81 10/27/2020   CALCIUM 8.9 10/27/2020      RADIOGRAPHY: CT Angio Head W or Wo Contrast  Result Date: 10/10/2020 CLINICAL DATA:  Fall. History of extensive stage small cell lung cancer. EXAM: CT ANGIOGRAPHY HEAD AND NECK TECHNIQUE: Multidetector CT imaging of the head and neck was performed using the standard protocol during bolus administration of intravenous contrast. Multiplanar CT image reconstructions and MIPs were obtained to evaluate the vascular anatomy. Carotid stenosis measurements (when applicable) are obtained  utilizing NASCET criteria, using the distal internal carotid diameter as the denominator. CONTRAST:   33m OMNIPAQUE IOHEXOL 350 MG/ML SOLN COMPARISON:  Head, cervical spine, and maxillofacial CTs earlier today. Neck CT 09/03/2020. No prior angiographic imaging. FINDINGS: CTA NECK FINDINGS Aortic arch: Standard 3 vessel aortic arch with mild atherosclerotic plaque. No significant arch vessel origin stenosis. Right carotid system: Patent with soft and calcified plaque in the carotid bulb. 65% stenosis of the ICA at the distal aspect of the bulb. Left carotid system: Patent with predominantly calcified plaque in the carotid bulb. No evidence of significant stenosis or dissection. Vertebral arteries: Patent without evidence of dissection or a significant stenosis on the left. Calcified plaque at the right vertebral artery origin results in moderate stenosis. Skeleton: Mild compression fractures in the upper thoracic spine, also present on a chest CT last month. Severe facet arthrosis on the left at C2-3 and on the right at C3-4 with trace anterolisthesis at the latter. Fracture involving the anterior wall of the left frontal sinus as described on earlier maxillofacial CT. Other neck: 1.3 cm short axis superficial lymph node along the right lower neck and 1.2 cm short axis left level V lymph node as noted on today's earlier cervical spine CT, both larger than on the 09/03/2020 neck CT. 1.9 x 1.3 cm low-density focus in the left nasopharyngeal region, unchanged from the prior neck CT and potentially reflecting a necrotic retropharyngeal lymph node. Unchanged 0.8 cm right parotid nodule. Upper chest: Severe emphysema. Review of the MIP images confirms the above findings CTA HEAD FINDINGS Anterior circulation: The internal carotid arteries are patent from skull base to carotid termini with minimal nonstenotic plaque. ACAs and MCAs are patent without evidence of a proximal branch occlusion or significant proximal stenosis. The right A1 segment is mildly hypoplastic. No aneurysm is identified. Posterior circulation: The  intracranial vertebral arteries are patent to the basilar with mild irregularity but no flow limiting stenosis. 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 There is a small right posterior communicating artery. Both PCAs are patent without evidence of a significant proximal stenosis. No aneurysm is identified. Venous sinuses: Patent. Anatomic variants: None of significance. Review of the MIP images confirms the above findings IMPRESSION: 1. No large vessel occlusion or significant proximal intracranial stenosis. 2. 65% proximal right ICA stenosis. 3. Moderate right vertebral artery origin stenosis. 4. Bilateral lower neck lymphadenopathy, mildly progressed from the 09/03/2020 neck CT. 5. Aortic Atherosclerosis (ICD10-I70.0) and Emphysema (ICD10-J43.9). Electronically Signed   By: ALogan BoresM.D.   On: 10/10/2020 19:54   DG Abd 1 View  Result Date: 10/16/2020 CLINICAL DATA:  Vomiting, orogastric tube placement EXAM: ABDOMEN - 1 VIEW COMPARISON:  Portable exam 0826 hours compared to 10/11/2020 FINDINGS: Slight gaseous distention of stomach. Tip of orogastric tube projects over stomach though the proximal side-port projects over distal esophagus; recommend advancing tube 8 cm. Nonobstructive bowel gas pattern. Improved aeration at LEFT lung base since prior study. Bones demineralized with LEFT hip prosthesis noted. IMPRESSION: Recommend advancing nasogastric to 8 cm. Improved aeration LEFT lower lobe. Electronically Signed   By: MLavonia DanaM.D.   On: 10/16/2020 08:36   CT HEAD WO CONTRAST  Result Date: 10/15/2020 CLINICAL DATA:  70year old male with neurologic deficit. EXAM: CT HEAD WITHOUT CONTRAST TECHNIQUE: Contiguous axial images were obtained from the base of the skull through the vertex without intravenous contrast. COMPARISON:  Head CT dated 09/13/2020. FINDINGS: Brain: There is mild age-related atrophy  and moderate chronic microvascular ischemic changes.  There is no acute intracranial hemorrhage. No mass effect midline shift. No extra-axial fluid collection. Vascular: No hyperdense vessel or unexpected calcification. Skull: There is fracture of the outer table of the left frontal sinus similar to the CT of 10/10/2020. No new fracture. Sinuses/Orbits: Mild mucoperiosteal thickening of paranasal sinuses. No air-fluid level. The mastoid air cells are clear. Other: Partially visualized endotracheal and enteric tubes. Left forehead hematoma. IMPRESSION: 1. No acute intracranial pathology. 2. Age-related atrophy and chronic microvascular ischemic changes. 3. Fracture of the outer table of the left frontal sinus with overlying forehead hematoma similar to the CT of 10/10/2020. Electronically Signed   By: Anner Crete M.D.   On: 10/15/2020 22:45   CT Head Wo Contrast  Result Date: 10/10/2020 CLINICAL DATA:  Fall on bathroom, laceration along the head EXAM: CT HEAD WITHOUT CONTRAST TECHNIQUE: Contiguous axial images were obtained from the base of the skull through the vertex without intravenous contrast. COMPARISON:  09/07/2019 FINDINGS: Brain: The brainstem, cerebellum, cerebral peduncles, thalami, basal ganglia, basilar cisterns, and ventricular system appear within normal limits. Periventricular white matter and corona radiata hypodensities favor chronic ischemic microvascular white matter disease. No intracranial hemorrhage, mass lesion, or acute CVA. Vascular: There is atherosclerotic calcification of the cavernous carotid arteries bilaterally. Skull: Left frontal fracture along the anterior margin of the frontal sinus underlying the scalp hematoma as shown on image 59 series 4. Sinuses/Orbits: There is opacification of several left-sided mastoid air cells. Although there is a fracture of the anterior margin of the left frontal sinus, no fluid is identified in the sinus at this time. Left periorbital hematoma without observed intraorbital abnormality. Other:  Left forehead scalp hematoma overlying the frontal fracture. IMPRESSION: 1. Fracture of the anterior wall of the left frontal sinus, with overlying scalp hematoma. 2. No intracranial hemorrhage or specific acute intracranial findings. Periventricular white matter and corona radiata hypodensities favor chronic ischemic microvascular white matter disease. 3. Opacification of several left-sided mastoid air cells. Electronically Signed   By: Van Clines M.D.   On: 10/10/2020 17:06   CT Angio Neck W and/or Wo Contrast  Result Date: 10/10/2020 CLINICAL DATA:  Fall. History of extensive stage small cell lung cancer. EXAM: CT ANGIOGRAPHY HEAD AND NECK TECHNIQUE: Multidetector CT imaging of the head and neck was performed using the standard protocol during bolus administration of intravenous contrast. Multiplanar CT image reconstructions and MIPs were obtained to evaluate the vascular anatomy. Carotid stenosis measurements (when applicable) are obtained utilizing NASCET criteria, using the distal internal carotid diameter as the denominator. CONTRAST:  21m OMNIPAQUE IOHEXOL 350 MG/ML SOLN COMPARISON:  Head, cervical spine, and maxillofacial CTs earlier today. Neck CT 09/03/2020. No prior angiographic imaging. FINDINGS: CTA NECK FINDINGS Aortic arch: Standard 3 vessel aortic arch with mild atherosclerotic plaque. No significant arch vessel origin stenosis. Right carotid system: Patent with soft and calcified plaque in the carotid bulb. 65% stenosis of the ICA at the distal aspect of the bulb. Left carotid system: Patent with predominantly calcified plaque in the carotid bulb. No evidence of significant stenosis or dissection. Vertebral arteries: Patent without evidence of dissection or a significant stenosis on the left. Calcified plaque at the right vertebral artery origin results in moderate stenosis. Skeleton: Mild compression fractures in the upper thoracic spine, also present on a chest CT last month. Severe  facet arthrosis on the left at C2-3 and on the right at C3-4 with trace anterolisthesis at the latter. Fracture involving the  anterior wall of the left frontal sinus as described on earlier maxillofacial CT. Other neck: 1.3 cm short axis superficial lymph node along the right lower neck and 1.2 cm short axis left level V lymph node as noted on today's earlier cervical spine CT, both larger than on the 09/03/2020 neck CT. 1.9 x 1.3 cm low-density focus in the left nasopharyngeal region, unchanged from the prior neck CT and potentially reflecting a necrotic retropharyngeal lymph node. Unchanged 0.8 cm right parotid nodule. Upper chest: Severe emphysema. Review of the MIP images confirms the above findings CTA HEAD FINDINGS Anterior circulation: The internal carotid arteries are patent from skull base to carotid termini with minimal nonstenotic plaque. ACAs and MCAs are patent without evidence of a proximal branch occlusion or significant proximal stenosis. The right A1 segment is mildly hypoplastic. No aneurysm is identified. Posterior circulation: The intracranial vertebral arteries are patent to the basilar with mild irregularity but no flow limiting stenosis. 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 There is a small right posterior communicating artery. Both PCAs are patent without evidence of a significant proximal stenosis. No aneurysm is identified. Venous sinuses: Patent. Anatomic variants: None of significance. Review of the MIP images confirms the above findings IMPRESSION: 1. No large vessel occlusion or significant proximal intracranial stenosis. 2. 65% proximal right ICA stenosis. 3. Moderate right vertebral artery origin stenosis. 4. Bilateral lower neck lymphadenopathy, mildly progressed from the 09/03/2020 neck CT. 5. Aortic Atherosclerosis (ICD10-I70.0) and Emphysema (ICD10-J43.9). Electronically Signed   By: Logan Bores M.D.   On: 10/10/2020 19:54   CT  Chest W Contrast  Result Date: 10/10/2020 CLINICAL DATA:  Fall in the bathroom. Left hip pain. Stage IV small cell lung cancer. EXAM: CT CHEST, ABDOMEN, AND PELVIS WITH CONTRAST TECHNIQUE: Multidetector CT imaging of the chest, abdomen and pelvis was performed following the standard protocol during bolus administration of intravenous contrast. CONTRAST:  80 cc Omnipaque 300 IV COMPARISON:  CT chest abdomen pelvis 09/03/2020. FINDINGS: CT CHEST FINDINGS Cardiovascular: No evidence of aortic or acute vascular injury. Moderate aortic atherosclerosis. Right chest port with tip in the lower SVC. Normal heart size with coronary artery calcifications. No pericardial fluid. Mediastinum/Nodes: No mediastinal hemorrhage or hematoma. No enlarged mediastinal or hilar lymph nodes. No pneumomediastinum. Decompressed esophagus. No thyroid nodule. Lungs/Pleura: No pneumothorax or pulmonary contusion. Moderately advanced emphysema. New from prior exam is frothy debris layering in the trachea and tracking into the left greater than right mainstem bronchi. Dependent opacity at the left lung base favored to be atelectasis. Multiple pulmonary nodules are again seen. Accurate measurement of these nodules is limited on the current exam due to motion. Dominant nodules are in the right lower lobe on series 5, image 117 and 123, and in the left lower lobe image 119. These are grossly stable, but again not well measured. No pleural fluid. Musculoskeletal: Breathing motion limits detailed rib assessment. No displaced rib fracture. No evidence of acute fracture of the sternum, included clavicles, or shoulder girdles. No fracture of the thoracic spine. No evidence of focal bone lesion. No confluent chest wall abnormality. CT ABDOMEN PELVIS FINDINGS Hepatobiliary: No hepatic injury or perihepatic hematoma. Tiny hypodense lesion within segment 4 of the liver is too small to characterize but unchanged. Unremarkable gallbladder. Pancreas: No  evidence of injury. No ductal dilatation or inflammation. Spleen: No splenic injury or perisplenic hematoma. Splenule anteriorly. Adrenals/Urinary Tract: No adrenal nodule. No adrenal hemorrhage. No evidence of renal injury. No hydronephrosis.  There are bilateral renal cysts as well as low-density lesions too small to accurately characterize, grossly stable from CT last month. Punctate calcification in the anterior dome of the bladder is unchanged. No bladder wall thickening or injury Stomach/Bowel: No evidence of bowel injury or acute abnormality. Lack of enteric contrast as well as patient motion limits detailed assessment. Decompressed stomach. No bowel inflammation. There is no free air. Normal appendix. Sigmoid diverticulosis without diverticulitis. Vascular/Lymphatic: No vascular injury. No acute vascular findings. Moderate aortic atherosclerosis without acute aortic abnormality. IVC is intact. No retroperitoneal fluid. No abdominopelvic adenopathy. Reproductive: Prostate is unremarkable. Other: No ascites or free fluid. No free air. Confluent body wall contusion. Musculoskeletal: Displaced, angulated mildly comminuted left femoral neck fracture. No obvious underlying bone lesion on CT. The remainder of the bony pelvis is intact. Nondisplaced left L3 transverse process fracture. IMPRESSION: 1. Displaced, angulated, mildly comminuted left femoral neck fracture. 2. Nondisplaced left L3 transverse process fracture. 3. No additional acute traumatic injury to the chest, abdomen, or pelvis. 4. New frothy debris layering in the trachea and tracking into the left greater than right mainstem bronchi, retained secretions or aspiration. 5. Multiple pulmonary nodules are grossly stable from CT last month. Please note the detailed nodule evaluation is limited on the current exam due to motion artifact. 6. Chronic findings of colonic diverticulosis without diverticulitis. Moderate emphysema. Aortic Atherosclerosis  (ICD10-I70.0) and Emphysema (ICD10-J43.9). Electronically Signed   By: Keith Rake M.D.   On: 10/10/2020 17:14   CT ANGIO CHEST PE W OR WO CONTRAST  Result Date: 10/15/2020 CLINICAL DATA:  70 year old male with shortness of breath, respiratory distress. COPD. Lung cancer. EXAM: CT ANGIOGRAPHY CHEST WITH CONTRAST TECHNIQUE: Multidetector CT imaging of the chest was performed using the standard protocol during bolus administration of intravenous contrast. Multiplanar CT image reconstructions and MIPs were obtained to evaluate the vascular anatomy. CONTRAST:  63m OMNIPAQUE IOHEXOL 350 MG/ML SOLN COMPARISON:  Portable chest 0220 hours today and earlier. CT chest 10/10/2020. FINDINGS: Cardiovascular: Excellent contrast bolus timing in the pulmonary arterial tree. No focal filling defect identified in the pulmonary arteries to suggest acute pulmonary embolism. Stable right chest Port-A-Cath. Negative thoracic aorta aside from atherosclerosis. No cardiomegaly or pericardial effusion. Calcified coronary artery atherosclerosis. Mediastinum/Nodes: Enteric tube courses through the esophagus and terminates in the proximal gastric body. Mediastinum is negative for lymphadenopathy. Small lymph nodes appear stable. Lungs/Pleura: Intubated. Endotracheal tube tip in good position between the clavicles and carina. Diffuse centrilobular emphysema. Opacified bronchus intermedius and scattered right middle and lower lobe airways, new since last month. But there is only minimal right lower lobe nodular peribronchial opacity at this time. On the left the mainstem bronchus remains patent, but both the lingula and lower lobe airways are opacified and there is confluent new left lower lobe consolidation with both hypo and hyperenhancing involved lung parenchyma. Mild lingula nodular peribronchial opacity.  No pleural effusion. Upper Abdomen: Negative visible liver, spleen, pancreas, and adrenal glands. Visible kidneys are stable,  benign-appearing left upper pole renal cyst. Negative visible bowel. Musculoskeletal: Stable T4 mild superior endplate compression. No acute or suspicious osseous lesion in the chest. Review of the MIP images confirms the above findings. IMPRESSION: 1. No evidence of acute pulmonary embolus. 2. Abundant retained secretions in the bilateral lower lobe and middle lobe airways, favor Aspiration. Associated confluent left lower lobe Pneumonia. Mild peribronchial infection suspected in the lingula and right lower lobe. Underlying Emphysema (ICD10-J43.9). No pleural effusion. 3. Satisfactory ET tube and enteric tube.  4. Calcified coronary artery and Aortic Atherosclerosis (ICD10-I70.0). Electronically Signed   By: Genevie Ann M.D.   On: 10/15/2020 04:22   CT Cervical Spine Wo Contrast  Result Date: 10/10/2020 CLINICAL DATA:  Fall, head injury. EXAM: CT CERVICAL SPINE WITHOUT CONTRAST TECHNIQUE: Multidetector CT imaging of the cervical spine was performed without intravenous contrast. Multiplanar CT image reconstructions were also generated. COMPARISON:  CT neck dated 09/03/2020 FINDINGS: Alignment: No vertebral subluxation is observed. Skull base and vertebrae: No cervical spine fracture or acute bony findings. Soft tissues and spinal canal: Atherosclerotic calcification of the carotid arteries and branch vessels. Superficial lymph node along the right lower neck measures 1.3 cm in short axis on image 69 of series 5, previously 1.0 cm. Hypodensity along the left posterior nasopharyngeal margin measuring 2.1 by 1.8 cm, similar to the prior exam. Left level V lymph node measures 1.2 cm in short axis on image 64 series 5, previously 0.9 cm. Disc levels: Mild left foraminal stenosis at C2-3, and right foraminal stenosis at C3-4 due to facet spurring. Upper chest: Severe emphysema in the lung apices. Biapical pleuroparenchymal scarring. Mucus in the trachea. Other: Right central line partially observed. IMPRESSION: 1. No acute  cervical spine findings. 2. Enlargement of 2 lymph nodes in the lower neck compared to the 09/03/2020 CT of the neck. This may be a manifestation of the patient's known malignancy. 3. Foraminal narrowing at C2-3 and C3-4 due to spurring. 4. Severe emphysema. 5. Carotid atherosclerosis. 6. Stable hypodensity along the left posterior nasopharynx, potentially a mucous retention cyst or necrotic lymph node. Electronically Signed   By: Van Clines M.D.   On: 10/10/2020 17:13   MR BRAIN W WO CONTRAST  Result Date: 10/27/2020 CLINICAL DATA:  70 year old male with history of metastatic small cell carcinoma. Encephalopathy after fall last month. Rhabdomyolysis. Unexplained neurologic deficits, dizziness, double vision. Restaging. EXAM: MRI HEAD WITHOUT AND WITH CONTRAST TECHNIQUE: Multiplanar, multiecho pulse sequences of the brain and surrounding structures were obtained without and with intravenous contrast. CONTRAST:  6.10m GADAVIST GADOBUTROL 1 MMOL/ML IV SOLN COMPARISON:  Head CT without contrast 10/15/2020 and earlier. Brain MRI 09/07/2019. CTA head and neck 10/10/2020, and earlier. FINDINGS: Brain: Ventriculomegaly, new since last year with dilated temporal horns. Up to mild transependymal edema. No restricted diffusion or evidence of acute infarction. But there is abnormal diffusion associated with multiple enhancing brain masses. The largest is a lobulated and heterogeneously enhancing 39 mm mass in the central lower cerebellum (series 11, image 12). There are 2 additional nearby cerebellar metastases, 23 mm in the left superior and up to 24 mm in the right superior cerebellum (same image). Subsequent severe cerebellar edema. Partially effaced 4th ventricle and basilar cisterns. Partially effaced cerebral aqueduct. No tonsillar herniation. Additional round 13 mm right caudate metastasis on series 10, image 27. mild edema at that site. Each of the lesions demonstrates mild hemosiderin. No supratentorial  midline shift. No dural thickening. Superimposed widespread chronic cerebral white matter T2 and FLAIR hyperintensity. Superimposed scattered chronic microhemorrhages elsewhere in the brain, likely small vessel related and not significantly changed since last year allowing for SWI technique today. No extra-axial collection or acute intracranial hemorrhage. Pituitary within normal limits. Vascular: Major intracranial vascular flow voids are stable since last year. Skull and upper cervical spine: Negative visible upper cervical spine, spinal cord. Visualized bone marrow signal is within normal limits. Sinuses/Orbits: Postoperative changes to both globes. Orbits appear stable and negative. Normal suprasellar cistern. Normal cavernous sinus. Paranasal Visualized paranasal  sinuses and mastoids are stable and well aerated. Other: Chronic left mastoid effusion has mildly increased. Rim enhancing multi cystic appearing mass at the left fossa of Rosenmuller is new since 2018, and significantly increased since last year now measuring up to the 3 cm (series 11, image 19). This does not seem to directly correspond to what were thought to be small benign midline nasopharyngeal cysts in 2018. And this has progressed from a neck CT 09/03/2020 Other visible upper neck soft tissues appear negative. IMPRESSION: 1. 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 2. Subsequent severe cerebellar edema with partially effaced 4th ventricle and cerebral aqueduct, and associated hydrocephalus with mild transependymal edema. Recommend Neurosurgery consultation. 3. Four brain metastases overall, including a 13 mm right caudate lesion. 4. Suspected cystic metastasis or ex-nodal disease which is enlarging at the left nasopharynx, fossa of Rosenmuller since February. In August last year this lesion was suspected to be cystic/necrotic lymphadenopathy by Neck CT. Study discussed by telephone  with Dr. Dia Crawford on 10/27/2020 at 11:35 . Electronically Signed   By: Genevie Ann M.D.   On: 10/27/2020 11:41   CT ABDOMEN PELVIS W CONTRAST  Result Date: 10/10/2020 CLINICAL DATA:  Fall in the bathroom. Left hip pain. Stage IV small cell lung cancer. EXAM: CT CHEST, ABDOMEN, AND PELVIS WITH CONTRAST TECHNIQUE: Multidetector CT imaging of the chest, abdomen and pelvis was performed following the standard protocol during bolus administration of intravenous contrast. CONTRAST:  80 cc Omnipaque 300 IV COMPARISON:  CT chest abdomen pelvis 09/03/2020. FINDINGS: CT CHEST FINDINGS Cardiovascular: No evidence of aortic or acute vascular injury. Moderate aortic atherosclerosis. Right chest port with tip in the lower SVC. Normal heart size with coronary artery calcifications. No pericardial fluid. Mediastinum/Nodes: No mediastinal hemorrhage or hematoma. No enlarged mediastinal or hilar lymph nodes. No pneumomediastinum. Decompressed esophagus. No thyroid nodule. Lungs/Pleura: No pneumothorax or pulmonary contusion. Moderately advanced emphysema. New from prior exam is frothy debris layering in the trachea and tracking into the left greater than right mainstem bronchi. Dependent opacity at the left lung base favored to be atelectasis. Multiple pulmonary nodules are again seen. Accurate measurement of these nodules is limited on the current exam due to motion. Dominant nodules are in the right lower lobe on series 5, image 117 and 123, and in the left lower lobe image 119. These are grossly stable, but again not well measured. No pleural fluid. Musculoskeletal: Breathing motion limits detailed rib assessment. No displaced rib fracture. No evidence of acute fracture of the sternum, included clavicles, or shoulder girdles. No fracture of the thoracic spine. No evidence of focal bone lesion. No confluent chest wall abnormality. CT ABDOMEN PELVIS FINDINGS Hepatobiliary: No hepatic injury or perihepatic hematoma. Tiny hypodense  lesion within segment 4 of the liver is too small to characterize but unchanged. Unremarkable gallbladder. Pancreas: No evidence of injury. No ductal dilatation or inflammation. Spleen: No splenic injury or perisplenic hematoma. Splenule anteriorly. Adrenals/Urinary Tract: No adrenal nodule. No adrenal hemorrhage. No evidence of renal injury. No hydronephrosis. There are bilateral renal cysts as well as low-density lesions too small to accurately characterize, grossly stable from CT last month. Punctate calcification in the anterior dome of the bladder is unchanged. No bladder wall thickening or injury Stomach/Bowel: No evidence of bowel injury or acute abnormality. Lack of enteric contrast as well as patient motion limits detailed assessment. Decompressed stomach. No bowel inflammation. There is no free air. Normal appendix. Sigmoid  diverticulosis without diverticulitis. Vascular/Lymphatic: No vascular injury. No acute vascular findings. Moderate aortic atherosclerosis without acute aortic abnormality. IVC is intact. No retroperitoneal fluid. No abdominopelvic adenopathy. Reproductive: Prostate is unremarkable. Other: No ascites or free fluid. No free air. Confluent body wall contusion. Musculoskeletal: Displaced, angulated mildly comminuted left femoral neck fracture. No obvious underlying bone lesion on CT. The remainder of the bony pelvis is intact. Nondisplaced left L3 transverse process fracture. IMPRESSION: 1. Displaced, angulated, mildly comminuted left femoral neck fracture. 2. Nondisplaced left L3 transverse process fracture. 3. No additional acute traumatic injury to the chest, abdomen, or pelvis. 4. New frothy debris layering in the trachea and tracking into the left greater than right mainstem bronchi, retained secretions or aspiration. 5. Multiple pulmonary nodules are grossly stable from CT last month. Please note the detailed nodule evaluation is limited on the current exam due to motion artifact. 6.  Chronic findings of colonic diverticulosis without diverticulitis. Moderate emphysema. Aortic Atherosclerosis (ICD10-I70.0) and Emphysema (ICD10-J43.9). Electronically Signed   By: Keith Rake M.D.   On: 10/10/2020 17:14   DG Pelvis Portable  Result Date: 10/12/2020 CLINICAL DATA:  Hip replacement EXAM: PORTABLE PELVIS 1-2 VIEWS COMPARISON:  10/12/2020, 10/11/2020 FINDINGS: Interval left hip arthroplasty with intact hardware and normal alignment. Gas in the soft tissues consistent with recent surgery. IMPRESSION: Status post left hip arthroplasty with expected postsurgical change Electronically Signed   By: Donavan Foil M.D.   On: 10/12/2020 20:57   DG CHEST PORT 1 VIEW  Result Date: 10/25/2020 CLINICAL DATA:  Short of breath EXAM: PORTABLE CHEST 1 VIEW COMPARISON:  10/23/2020 FINDINGS: COPD with hyperexpansion and scarring in the bases. Mild left lower lobe airspace disease slightly improved. No effusion or edema. Port-A-Cath tip in the lower SVC. Feeding tube enters the stomach with the tip not visualized. IMPRESSION: COPD.  Improvement in left lower lobe atelectasis/infiltrate. Electronically Signed   By: Franchot Gallo M.D.   On: 10/25/2020 17:27   DG CHEST PORT 1 VIEW  Result Date: 10/23/2020 CLINICAL DATA:  Cough. EXAM: PORTABLE CHEST 1 VIEW COMPARISON:  10/16/2020 FINDINGS: Stable normal sized heart. Airspace opacity is again demonstrated in the left lower lobe with mild improvement. The remainder of the lungs are clear and hyperexpanded with mild chronic prominence of the interstitial markings. Right jugular porta catheter tip at the superior cavoatrial junction. Feeding tube extending into the stomach. Unremarkable bones. IMPRESSION: 1. Mildly improved left lower lobe pneumonia. 2. Mild changes of COPD. Electronically Signed   By: Claudie Revering M.D.   On: 10/23/2020 08:37   DG CHEST PORT 1 VIEW  Result Date: 10/16/2020 CLINICAL DATA:  Hypoxia EXAM: PORTABLE CHEST 1 VIEW COMPARISON:  October 15, 2020 chest radiograph and chest CT FINDINGS: Endotracheal tube tip is 4.4 cm above the carina. Nasogastric tube tip and side port in stomach. Port-A-Cath tip in superior vena cava. No pneumothorax. There is underlying emphysematous change. There is airspace consolidation left lower lobe. The heart size is normal. Diminished vascularity in the upper lobes is felt to be due to underlying emphysema. There is aortic atherosclerosis. No bone lesions. No adenopathy. IMPRESSION: Tube and catheter positions as described without pneumothorax. Persistent consolidation consistent with pneumonia left lower lobe. No new opacity. Underlying emphysematous change. There is aortic atherosclerosis. Aortic Atherosclerosis (ICD10-I70.0) and Emphysema (ICD10-J43.9). Electronically Signed   By: Lowella Grip III M.D.   On: 10/16/2020 14:03   DG CHEST PORT 1 VIEW  Result Date: 10/15/2020 CLINICAL DATA:  Dyspnea EXAM: PORTABLE  CHEST 1 VIEW COMPARISON:  10/15/2020 FINDINGS: The endotracheal tube terminates above the carina. The right-sided Port-A-Cath is stable in positioning. There is a persistent dense retrocardiac opacity. The lungs appear hyperexpanded. There is no pneumothorax. The enteric tube tip is not visualized on this study. The side hole projects over the GE junction. IMPRESSION: 1. Lines and tubes as above. 2. Otherwise, no significant interval change. Electronically Signed   By: Constance Holster M.D.   On: 10/15/2020 02:37   DG Chest Port 1 View  Result Date: 10/15/2020 CLINICAL DATA:  Acute respiratory distress EXAM: PORTABLE CHEST 1 VIEW COMPARISON:  10/13/2020 FINDINGS: Right Port-A-Cath remains in place, unchanged. There is hyperinflation of the lungs compatible with COPD. Heart is normal size. Left retrocardiac airspace opacity again noted. Increasing right basilar atelectasis or infiltrate. No effusions or acute bony abnormality. IMPRESSION: Bibasilar atelectasis or infiltrates. Electronically Signed   By:  Rolm Baptise M.D.   On: 10/15/2020 01:53   DG Chest Port 1 View  Result Date: 10/13/2020 CLINICAL DATA:  Acute respiratory failure, COPD EXAM: PORTABLE CHEST 1 VIEW COMPARISON:  10/11/2020 chest radiograph. FINDINGS: Right internal jugular Port-A-Cath terminates at the cavoatrial junction. Stable cardiomediastinal silhouette with top-normal heart size. No pneumothorax. Small left pleural effusion is stable. No right pleural effusion. Cephalization of the pulmonary vasculature without overt pulmonary edema. Patchy left retrocardiac opacity is similar. IMPRESSION: 1. Stable small left pleural effusion. 2. Stable patchy left retrocardiac opacity, either atelectasis or pneumonia. Electronically Signed   By: Ilona Sorrel M.D.   On: 10/13/2020 08:07   DG CHEST PORT 1 VIEW  Result Date: 10/11/2020 CLINICAL DATA:  History distress Evaluate for pneumothorax or pulmonary contusion Advanced emphysema Atelectasis EXAM: PORTABLE CHEST 1 VIEW COMPARISON:  10/18/2013 FINDINGS: Dense opacity noted in the left medial lung base. Right lung is hyperexpanded but otherwise clear. Right chest port is seen. IMPRESSION: 1. No pneumothorax. 2. Opacity at the right medial lung base may be due to atelectasis or pneumonia. Electronically Signed   By: Miachel Roux M.D.   On: 10/11/2020 13:39   DG CHEST PORT 1 VIEW  Result Date: 10/11/2020 CLINICAL DATA:  Evaluate ET tube placement EXAM: PORTABLE CHEST 1 VIEW COMPARISON:  Earlier today. FINDINGS: The ET tube tip is in satisfactory position above the carina. There is an NG tube in place with tip and side port well below the GE junction. The right chest wall port a catheter tip is at the cavoatrial junction. Stable cardiomediastinal contours. Interval complete atelectasis of the left lower lobe with asymmetric left lung volume loss. IMPRESSION: 1. ET tube tip in satisfactory position above the carina. 2. Interval complete atelectasis of the left lower lobe. Electronically Signed   By:  Kerby Moors M.D.   On: 10/11/2020 13:26   DG Abd Portable 1V  Result Date: 10/11/2020 CLINICAL DATA:  ET/OG tube placement EXAM: PORTABLE ABDOMEN - 1 VIEW COMPARISON:  CT chest, abdomen, and pelvis 09/03/2020 FINDINGS: Left basilar opacity is noted. The OG tube terminates in the left upper quadrant at the expected location of the stomach. No dilated loops of bowel to indicate ileus or obstruction. IMPRESSION: OG tube terminates in the left upper quadrant in the expected site of the stomach. Electronically Signed   By: Miachel Roux M.D.   On: 10/11/2020 13:36   DG Swallowing Func-Speech Pathology  Result Date: 10/22/2020 Objective Swallowing Evaluation: Type of Study: MBS-Modified Barium Swallow Study  Patient Details Name: [name] MRN: [id number] Date of Birth:  Jun 07, 1951 Today's Date: 10/22/2020 Time: SLP Start Time (ACUTE ONLY): 1440 -SLP Stop Time (ACUTE ONLY): 1455 SLP Time Calculation (min) (ACUTE ONLY): 15 min Past Medical History: Past Medical History: Diagnosis Date . Anxiety  . CAD (coronary artery disease)  . Cancer ([place])   stage 4 small cell lung cancer  . COPD (chronic obstructive pulmonary disease) ([place])  . Depression  . Dyspnea   increased exertion . Feeling of chest tightness  . Heart palpitations  . History of chemotherapy  . Myocardial infarction ([place])  . Osteopenia  . Panic attacks  . Smoker  Past Surgical History: Past Surgical History: Procedure Laterality Date . BACK SURGERY  12/24/2000  L5,S1 . CORONARY STENT PLACEMENT  2005  RCA & CX . HERNIA REPAIR Right 1980's . INGUINAL HERNIA REPAIR  12/1978  right side . IR FLUORO GUIDE PORT INSERTION RIGHT  04/02/2017 . IR US GUIDE BX ASP/DRAIN  02/03/2017 . IR US GUIDE VASC ACCESS RIGHT  04/02/2017 . NM MYOCAR PERF WALL MOTION  09/07/2009  No ischemia; EF 51% . SHOULDER SURGERY Left 08/2010 . SPINE SURGERY  2002  L5-S1 . TOTAL HIP ARTHROPLASTY Left 10/12/2020  Procedure: TOTALhemi  HIP ARTHROPLASTY ANTERIOR APPROACH;  Surgeon: [name], MD;   Location: [facility];  Service: Orthopedics;  Laterality: Left; HPI: Pt is a 70 yo male admitted 3/30 with AMS after fall with unknown downtime (1-3 days suspected). Pt was found to have L hip fx, L L3 transverse process fx, L frontal sinus fx, and AKI with rhabdomyolysis. On 3/31 pt had worsening hypoxia thought to be related to an aspiration event, requiring ETT 3/31-4/2; another suspected aspiration event on requiring re-intubation 4/4-4/6. 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 Pt was reporting intermittent difficulty swallowing solids in 2019 per cancer center notes.  Subjective: Alert, but pleasantly confused Assessment / Plan / Recommendation CHL IP CLINICAL IMPRESSIONS 10/22/2020 Clinical Impression Pt presents with oropharyngeal dysphagia characterized by reduced bolus cohesion, a pharyngeal delay, reduced lingual retraction, and reduced anterior laryngeal movement. He demonstrated premature spillage to the valleculae with inconsistent spillover to the pyriform sinuses, base of tongue residue, vallecular residue, pyriform sinus residue, penetration (PAS 3, 5) and inconsistent aspiration (PAS 7) of thin and nectar thick iquids. Aspiration consistently triggered a spontaneous cough which was inconsistently effective in expelling the aspirate. Penetration was improved and aspiration eliminated with reduced bolus sizes of nectar thick and thin liquids via cup; however, pt was unable to consistently reduce bolus size and eliminate use of a posterior head tilt despite cues and prompts. Pharyngeal residue was reduced with use of a liquid wash and pt was able to swallow a barium tablet with honey thick liquids via cup. A dysphagia 2 diet with honey thick liquids will be initiated at this time. However, there is potential for advancement of liquids once pt is able to demonstrate use of swallowing precautions.  SLP will follow for treatment. SLP Visit Diagnosis Dysphagia, oropharyngeal phase (R13.12) Attention and concentration deficit following -- Frontal lobe and executive function deficit following -- Impact on safety and function Mild aspiration risk;Moderate aspiration risk   CHL IP TREATMENT RECOMMENDATION 10/22/2020 Treatment Recommendations Therapy as outlined in treatment plan below;F/U MBS in --- days (Comment)   Prognosis 10/22/2020 Prognosis for Safe Diet Advancement Good Barriers to Reach Goals Cognitive deficits Barriers/Prognosis Comment -- CHL IP DIET RECOMMENDATION 10/22/2020 SLP Diet Recommendations Dysphagia 2 (Fine chop) solids;Honey thick  liquids Liquid Administration via Cup;Straw Medication Administration Whole meds with puree Compensations Slow rate;Small sips/bites;Minimize environmental distractions;Follow solids with liquid Postural Changes Remain semi-upright after after feeds/meals (Comment);Seated upright at 90 degrees   CHL IP OTHER RECOMMENDATIONS 10/22/2020 Recommended Consults -- Oral Care Recommendations Oral care BID Other Recommendations Order thickener from pharmacy   CHL IP FOLLOW UP RECOMMENDATIONS 10/22/2020 Follow up Recommendations Skilled Nursing facility   [facility] IP FREQUENCY AND DURATION 10/22/2020 Speech Therapy Frequency (ACUTE ONLY) min 2x/week Treatment Duration 2 weeks      CHL IP ORAL PHASE 10/22/2020 Oral Phase Impaired Oral - Pudding Teaspoon -- Oral - Pudding Cup -- Oral - Honey Teaspoon -- Oral - Honey Cup -- Oral - Nectar Teaspoon -- Oral - Nectar Cup Decreased bolus cohesion;Premature spillage Oral - Nectar Straw Decreased bolus cohesion;Premature spillage Oral - Thin Teaspoon -- Oral - Thin Cup Decreased bolus cohesion;Premature spillage Oral - Thin Straw Decreased bolus cohesion;Premature spillage Oral - Puree WFL Oral - Mech Soft -- Oral - Regular Impaired mastication Oral - Multi-Consistency -- Oral - Pill WFL Oral Phase - Comment --  CHL IP PHARYNGEAL PHASE 10/22/2020  Pharyngeal Phase Impaired Pharyngeal- Pudding Teaspoon -- Pharyngeal -- Pharyngeal- Pudding Cup -- Pharyngeal -- Pharyngeal- Honey Teaspoon -- Pharyngeal -- Pharyngeal- Honey Cup -- Pharyngeal -- Pharyngeal- Nectar Teaspoon -- Pharyngeal -- Pharyngeal- Nectar Cup Trace aspiration;Pharyngeal residue - valleculae;Pharyngeal residue - pyriform;Reduced tongue base retraction;Penetration/Aspiration during swallow;Penetration/Apiration after swallow;Reduced anterior laryngeal mobility;Delayed swallow initiation-vallecula;Delayed swallow initiation-pyriform sinuses Pharyngeal Material enters airway, remains ABOVE vocal cords and not ejected out;Material enters airway, passes BELOW cords and not ejected out despite cough attempt by patient Pharyngeal- Nectar Straw Trace aspiration;Pharyngeal residue - valleculae;Pharyngeal residue - pyriform;Reduced tongue base retraction;Penetration/Aspiration during swallow;Penetration/Apiration after swallow;Reduced anterior laryngeal mobility;Delayed swallow initiation-vallecula;Delayed swallow initiation-pyriform sinuses Pharyngeal Material enters airway, CONTACTS cords and not ejected out;Material enters airway, passes BELOW cords and not ejected out despite cough attempt by patient Pharyngeal- Thin Teaspoon -- Pharyngeal -- Pharyngeal- Thin Cup Trace aspiration;Pharyngeal residue - valleculae;Pharyngeal residue - pyriform;Reduced tongue base retraction;Penetration/Aspiration during swallow;Penetration/Apiration after swallow;Reduced anterior laryngeal mobility;Delayed swallow initiation-vallecula;Delayed swallow initiation-pyriform sinuses Pharyngeal Material enters airway, passes BELOW cords and not ejected out despite cough attempt by patient;Material enters airway, remains ABOVE vocal cords and not ejected out Pharyngeal- Thin Straw Trace aspiration;Pharyngeal residue - valleculae;Pharyngeal residue - pyriform;Reduced tongue base retraction;Penetration/Aspiration during  swallow;Penetration/Apiration after swallow;Reduced anterior laryngeal mobility;Delayed swallow initiation-vallecula;Delayed swallow initiation-pyriform sinuses Pharyngeal Material enters airway, CONTACTS cords and not ejected out;Material enters airway, passes BELOW cords and not ejected out despite cough attempt by patient Pharyngeal- Puree Pharyngeal residue - valleculae;Pharyngeal residue - pyriform;Reduced tongue base retraction;Reduced anterior laryngeal mobility;Delayed swallow initiation-vallecula;Delayed swallow initiation-pyriform sinuses Pharyngeal -- Pharyngeal- Mechanical Soft -- Pharyngeal -- Pharyngeal- Regular -- Pharyngeal -- Pharyngeal- Multi-consistency -- Pharyngeal -- Pharyngeal- Pill Pharyngeal residue - valleculae;Pharyngeal residue - pyriform;Reduced tongue base retraction;Reduced anterior laryngeal mobility;Delayed swallow initiation-vallecula;Delayed swallow initiation-pyriform sinuses Pharyngeal -- Pharyngeal Comment --  CH[facility] IP CERVICAL ESOPHAGEAL PHASE 10/22/2020 Cervical Esophageal Phase WFL Pudding Teaspoon -- Pudding Cup -- Honey Teaspoon -- Honey Cup -- Nectar Teaspoon -- Nectar Cup -- Nectar Straw -- Thin Teaspoon -- Thin Cup -- Thin Straw -- Puree -- Mechanical Soft -- Regular -- Multi-consistency -- Pill -- Cervical Esophageal Comment -- Shanika I. Hardin Negus, [place], [place] Office number [phone] Pager [place] 10/22/2020, 4:50 PM              DG C-Arm 1-60 Min  Result Date: 10/12/2020 CLINICAL  DATA:  Anterior left hip. EXAM: DG C-ARM 1-60 MIN; OPERATIVE LEFT HIP WITH PELVIS FLUOROSCOPY TIME:  Fluoroscopy Time:  6 seconds Number of Acquired Spot Images: 6. COMPARISON:  March 31, 22 FINDINGS: Six C-arm fluoroscopic images were obtained intraoperatively and submitted for post operative interpretation. These images demonstrate surgical changes of left total hip arthroplasty for a left femoral neck fracture. Please see the  performing provider's procedural report for further detail. IMPRESSION: Left total hip arthroplasty. Electronically Signed   By: Margaretha Sheffield MD   On: 10/12/2020 13:46   ECHOCARDIOGRAM COMPLETE  Result Date: 10/11/2020    ECHOCARDIOGRAM REPORT   Patient Name:   [name] Date of Exam: 10/11/2020 Medical Rec #:  [id number]      Height:       69.0 in Accession #:    [id number]     Weight:       147.0 lb Date of Birth:  1951-05-21     BSA:          1.813 m Patient Age:    70 years       BP:           85/49 mmHg Patient Gender: M              HR:           88 bpm. Exam Location:  Inpatient Procedure: 2D Echo Indications:    pulmonary embolus  History:        Patient has no prior history of Echocardiogram examinations.                 CAD; COPD and lung cancer.  Sonographer:    Johny Chess Referring Phys: [id number]  Sonographer Comments: Echo performed with patient supine and on artificial respirator. Image acquisition challenging due to respiratory motion. IMPRESSIONS  1. Left ventricular ejection fraction, by estimation, is 50 to 55%. The left ventricle has low normal function. The left ventricle has no regional wall motion abnormalities. Left ventricular diastolic parameters are indeterminate.  2. Right ventricular systolic function is normal. The right ventricular size is normal.  3. The mitral valve is grossly normal. Trivial mitral valve regurgitation.  4. The aortic valve has an indeterminant number of cusps. There is moderate calcification of the aortic valve. There is mild thickening of the aortic valve. Aortic valve regurgitation is trivial. Mild to moderate aortic valve sclerosis/calcification is present, without any evidence of aortic stenosis.  5. The inferior vena cava is dilated in size with >50% respiratory variability, suggesting right atrial pressure of 8 mmHg. Comparison(s): No prior Echocardiogram. Conclusion(s)/Recommendation(s): Otherwise normal echocardiogram, with minor  abnormalities described in the report. Frequent PVCs throughout study. FINDINGS  Left Ventricle: Left ventricular ejection fraction, by estimation, is 50 to 55%. The left ventricle has low normal function. The left ventricle has no regional wall motion abnormalities. The left ventricular internal cavity size was normal in size. There is no left ventricular hypertrophy. Left ventricular diastolic parameters are indeterminate. Right Ventricle: The right ventricular size is normal. No increase in right ventricular wall thickness. Right ventricular systolic function is normal. Left Atrium: Left atrial size was normal in size. Right Atrium: Right atrial size was normal in size. Pericardium: There is no evidence of pericardial effusion. Presence of pericardial fat pad. Mitral Valve: The mitral valve is grossly normal. Trivial mitral valve regurgitation. Tricuspid Valve: The tricuspid valve is normal in structure. Tricuspid valve regurgitation is trivial. Aortic Valve: The aortic valve has  an indeterminant number of cusps. There is moderate calcification of the aortic valve. There is mild thickening of the aortic valve. Aortic valve regurgitation is trivial. Mild to moderate aortic valve sclerosis/calcification is present, without any evidence of aortic stenosis. Pulmonic Valve: The pulmonic valve was not well visualized. Pulmonic valve regurgitation is not visualized. Aorta: The aortic root is normal in size and structure and the ascending aorta was not well visualized. Venous: The inferior vena cava is dilated in size with greater than 50% respiratory variability, suggesting right atrial pressure of 8 mmHg. IAS/Shunts: The atrial septum is grossly normal.  LEFT VENTRICLE PLAX 2D LVIDd:         5.00 cm  Diastology LVIDs:         3.90 cm  LV e' medial:  4.46 cm/s LV PW:         1.00 cm  LV e' lateral: 7.94 cm/s LV IVS:        1.00 cm LVOT diam:     2.40 cm LV SV:         61 LV SV Index:   33 LVOT Area:     4.52 cm  RIGHT  VENTRICLE             IVC RV S prime:     10.80 cm/s  IVC diam: 2.60 cm TAPSE (M-mode): 1.7 cm LEFT ATRIUM           Index       RIGHT ATRIUM           Index LA diam:      2.90 cm 1.60 cm/m  RA Area:     12.80 cm LA Vol (A4C): 21.9 ml 12.08 ml/m RA Volume:   30.80 ml  16.99 ml/m  AORTIC VALVE LVOT Vmax:   77.70 cm/s LVOT Vmean:  55.800 cm/s LVOT VTI:    0.134 m  AORTA Ao Root diam: 3.20 cm  SHUNTS Systemic VTI:  0.13 m Systemic Diam: 2.40 cm Buford Dresser MD Electronically signed by Buford Dresser MD Signature Date/Time: 10/11/2020/9:14:16 PM    Final    DG HIP OPERATIVE UNILAT W OR W/O PELVIS LEFT  Result Date: 10/12/2020 CLINICAL DATA:  Anterior left hip. EXAM: DG C-ARM 1-60 MIN; OPERATIVE LEFT HIP WITH PELVIS FLUOROSCOPY TIME:  Fluoroscopy Time:  6 seconds Number of Acquired Spot Images: 6. COMPARISON:  March 31, 22 FINDINGS: Six C-arm fluoroscopic images were obtained intraoperatively and submitted for post operative interpretation. These images demonstrate surgical changes of left total hip arthroplasty for a left femoral neck fracture. Please see the performing provider's procedural report for further detail. IMPRESSION: Left total hip arthroplasty. Electronically Signed   By: Margaretha Sheffield MD   On: 10/12/2020 13:46   DG HIP UNILAT WITH PELVIS 2-3 VIEWS LEFT  Result Date: 10/11/2020 CLINICAL DATA:  Left hip fracture. EXAM: DG HIP (WITH OR WITHOUT PELVIS) 2-3V LEFT COMPARISON:  Abdominal CT reformats yesterday. FINDINGS: Displaced left femoral neck fracture. Fracture is mildly comminuted. There is mild apex lateral angulation and proximal migration of the femoral shaft. Pubic rami are intact. Pubic symphysis and sacroiliac joints are congruent. IMPRESSION: Displaced left femoral neck fracture. Electronically Signed   By: Keith Rake M.D.   On: 10/11/2020 17:56   CT Maxillofacial Wo Contrast  Result Date: 10/10/2020 CLINICAL DATA:  Facial trauma EXAM: CT MAXILLOFACIAL  WITHOUT CONTRAST TECHNIQUE: Multidetector CT imaging of the maxillofacial structures was performed. Multiplanar CT image reconstructions were also generated. COMPARISON:  CT head  10/10/2020 FINDINGS: Osseous: Acute fracture of the anterior wall of the left frontal sinus with overlying scalp hematoma as on image 75 series 5. No significant fluid is currently present in the sinus. No other facial fractures are identified. Orbits: Left periorbital hematoma but without intraorbital abnormality observed. Sinuses: As noted above there is a fracture along the anterior wall of the left frontal sinus but without a fluid level in the sinus. Opacification of several mastoid air cells noted compatible with a small left mastoid effusion. Soft tissues: Left forehead scalp hematoma extending into the periorbital region. Limited intracranial: Please see CT head report. IMPRESSION: 1. Acute fracture the anterior wall of the left frontal sinus with overlying scalp hematoma. 2. Small left mastoid effusion. Electronically Signed   By: Van Clines M.D.   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  Patient has responded to loading dose of steroids.  Given that he has shown some improvement in his confusion compared to yesterday he will proceed with urgent brain radiation therapy.  Plan will be to continue him on steroids.  If he shows deterioration later we may need to consider placement of the ventricular catheter.  I discussed the course of treatment side effects and potential toxicities with the patient.  He did give verbal consent to the treatment.  I also spoke with his brother who also agrees with radiation treatment.  PLAN: Patient will proceed with clinical simulation and treatment today.  He will receive 3 Pearline Cables to the whole brain.  He will return tomorrow for formal CT simulation and his second treatment.  Plan is for  him to receive 30 Gray in 10 fractions to the whole brain.  I spent 60 minutes minutes face to face with the patient and more than 50% of that time was spent in counseling and/or coordination of care.   ------------------------------------------------  Blair Promise, PhD, MD

## 2020-10-28 NOTE — Progress Notes (Signed)
Repeat bladder scan read as 500 mls urine, patient has not voided for greater than 8 hours since removal of foley and feels no urge to void. In and out cath performed as per protocol, obtained 400 mls yellow urine.  Will continue to monitor for retention.

## 2020-10-28 NOTE — Plan of Care (Signed)
Patient maintains oxygenlevel in the 90's on room air, up to chair with 2 assist for several hours this shift.  PO intake remains poor, will drink some thin liquids but only 1 or 2 bites of solid foods.

## 2020-10-28 NOTE — Progress Notes (Signed)
  Radiation Oncology         [phone] ________________________________  Name: [name] MRN: [id number]  Date: 10/28/2020  DOB: 08-Jan-1951   TREATMENT  NOTE    ICD-10-CM   1. Primary malignant neoplasm of lung with metastasis to brain ([place])  C34.90    C79.31     DIAGNOSIS: Extensive stage small cell lung cancer with new diagnosis of brain metastasis  NARRATIVE: Today the patient received 3 Gy to the brain  PLAN:  The patient will receive a total dose of 30 Gy in 10 fractions.  -----------------------------------  Blair Promise, PhD, MD

## 2020-10-29 ENCOUNTER — Ambulatory Visit: Payer: Medicare HMO | Admitting: Radiation Oncology

## 2020-10-29 ENCOUNTER — Telehealth: Payer: Self-pay | Admitting: *Deleted

## 2020-10-29 ENCOUNTER — Ambulatory Visit
Admit: 2020-10-29 | Discharge: 2020-10-29 | Disposition: A | Discharge status: Home / Self Care | Payer: Medicare HMO | Attending: Radiation Oncology | Admitting: Radiation Oncology

## 2020-10-29 DIAGNOSIS — R531 Weakness: Secondary | ICD-10-CM | POA: Diagnosis not present

## 2020-10-29 DIAGNOSIS — E43 Unspecified severe protein-calorie malnutrition: Secondary | ICD-10-CM | POA: Diagnosis not present

## 2020-10-29 DIAGNOSIS — Z7189 Other specified counseling: Secondary | ICD-10-CM | POA: Diagnosis not present

## 2020-10-29 DIAGNOSIS — L89156 Pressure-induced deep tissue damage of sacral region: Secondary | ICD-10-CM | POA: Diagnosis not present

## 2020-10-29 DIAGNOSIS — Z515 Encounter for palliative care: Secondary | ICD-10-CM | POA: Diagnosis not present

## 2020-10-29 DIAGNOSIS — C3490 Malignant neoplasm of unspecified part of unspecified bronchus or lung: Secondary | ICD-10-CM | POA: Diagnosis not present

## 2020-10-29 DIAGNOSIS — L89159 Pressure ulcer of sacral region, unspecified stage: Secondary | ICD-10-CM | POA: Diagnosis not present

## 2020-10-29 LAB — COMPREHENSIVE METABOLIC PANEL
ALT: 27 U/L (ref 0–44)
AST: 21 U/L (ref 15–41)
Albumin: 3.3 g/dL — ABNORMAL LOW (ref 3.5–5.0)
Alkaline Phosphatase: 61 U/L (ref 38–126)
Anion gap: 6 (ref 5–15)
BUN: 27 mg/dL — ABNORMAL HIGH (ref 8–23)
CO2: 25 mmol/L (ref 22–32)
Calcium: 9.2 mg/dL (ref 8.9–10.3)
Chloride: 108 mmol/L (ref 98–111)
Creatinine, Ser: 0.83 mg/dL (ref 0.61–1.24)
GFR, Estimated: >60 mL/min (ref >60)
Glucose, Bld: 163 mg/dL — ABNORMAL HIGH (ref 70–99)
Potassium: 4.5 mmol/L (ref 3.5–5.1)
Sodium: 139 mmol/L (ref 135–145)
Total Bilirubin: 0.5 mg/dL (ref 0.3–1.2)
Total Protein: 6.2 g/dL — ABNORMAL LOW (ref 6.5–8.1)

## 2020-10-29 LAB — GLUCOSE, CAPILLARY
Glucose-Capillary: 105 mg/dL — ABNORMAL HIGH (ref 70–99)
Glucose-Capillary: 138 mg/dL — ABNORMAL HIGH (ref 70–99)
Glucose-Capillary: 147 mg/dL — ABNORMAL HIGH (ref 70–99)
Glucose-Capillary: 153 mg/dL — ABNORMAL HIGH (ref 70–99)
Glucose-Capillary: 158 mg/dL — ABNORMAL HIGH (ref 70–99)
Glucose-Capillary: 163 mg/dL — ABNORMAL HIGH (ref 70–99)
Glucose-Capillary: 96 mg/dL (ref 70–99)

## 2020-10-29 LAB — CBC
HCT: 27.4 % — ABNORMAL LOW (ref 39.0–52.0)
Hemoglobin: 8.7 g/dL — ABNORMAL LOW (ref 13.0–17.0)
MCH: 30.9 pg (ref 26.0–34.0)
MCHC: 31.8 g/dL (ref 30.0–36.0)
MCV: 97.2 fL (ref 80.0–100.0)
Platelets: 319 10*3/uL (ref 150–400)
RBC: 2.82 MIL/uL — ABNORMAL LOW (ref 4.22–5.81)
RDW: 19.1 % — ABNORMAL HIGH (ref 11.5–15.5)
WBC: 8.1 10*3/uL (ref 4.0–10.5)
nRBC: 0 % (ref 0.0–0.2)

## 2020-10-29 LAB — MAGNESIUM: Magnesium: 2.2 mg/dL (ref 1.7–2.4)

## 2020-10-29 LAB — PHOSPHORUS: Phosphorus: 3.4 mg/dL (ref 2.5–4.6)

## 2020-10-29 MED ORDER — JEVITY 1.5 CAL/FIBER PO LIQD
1000.0000 mL | ORAL | Status: DC
  Administered 2020-10-29: 1000 mL
  Filled 2020-10-29: qty 1000

## 2020-10-29 NOTE — Progress Notes (Signed)
PT Cancellation Note  Patient Details Name: [name] MRN: [id number] DOB: 1951-01-12   Cancelled Treatment:    Reason Eval/Treat Not Completed: Patient at procedure or test/unavailable; attempted x2 this pm, pt off unit--likely at XRT. Continue efforts    [facility] 10/29/2020, 2:43 PM

## 2020-10-29 NOTE — Progress Notes (Signed)
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 Admitted to [facility] on 3/30 for encephalopathy following a fall with associated facial fractures.  Patient was found by brother on 3/30 after he had been unable to reach patient for unknown period of time (1 to 3 days suspected).  Patient fracture left frontal sinus, L3 transverse process, displaced angulated mildly comminuted left femoral neck fracture.  AKI with rhabdomyolysis. On 3/31 patient develop worsening hypoxia and respiratory distress, intubated.  Subsequently extubated 4/2 and transferred out of the ICU on 4/3.  The evening of 4/3--4/4 he developed increased work of breathing and increased oxygen needs requiring intubation.  This was thought to be related to an aspiration event.   Subjective: No acute issues/events overnight - mental status moderately improving this morning, alert oriented x4 denies nausea vomiting diarrhea constipation headache fevers or chills.  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 Lengthy talk today at bedside with the patient about his poor prognosis, ongoing palliative treatment and goals.  He apparently was unaware that he has a very advanced stage of metastatic disease with grim prognosis, he is unclear whether or not he wishes to continue with this modality of treatment if it is not curative, we also discussed possible need for discharge to location other than home given he lives alone at home and will likely not be able to care for himself in the interim or as his disease advances. -We did discuss discharge to his brother's house versus hospice house, he does not wish to live with his brother, palliative care to continue this conversation for possible hospice house admission once patient has completed inpatient therapies. -Lengthy discussion about CODE STATUS as well today, patient does not wish to be kept "artificially alive" but noted to discuss being DNR with his family. This is notably important given his poor prognosis.  He is currently still on tube feeds via core track which we discussed was not something to be continued at discharge especially not if we were discussing hospice house.  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 Delton Coombes with oncology and Dr. Sondra Come with rads oncology - now at [facility] for ongoing palliative radiation - Dr. Ashok Pall neurosurgery does not believe patient currently needs shunt however states if radiation oncology requires shunt placement he is available for placement  Acute respiratory  failure with hypoxia/Aspiration pneumonia/mucus plugging/likely history of emphysema requiring intubation/PPV, Resolving - 4/5 Mucus Plug - 4/14 PCXR pending: Although patient does not feel SOB SPO2 drops with 4L nasal cannula on. Most likely secondary to patient being a mouth breather and having  some mild obstruction with NG tube in place.   - Patient previously requiring Ventimask when sleeping given mouth breathing with NG tube in - follow clinically  Hx Small cell lung cancer  - S/p chemotherapy Lurbinectedin last cycle 09/12/20. - Extubated 4\6 - Completed 7 days of cefepime 4/05. - Brovana/Yupelri nebulizer treatments and PRN albuterol.  - received Solumedrol on 4/05 during mucus plug event, prednisone for COPD.  - Completed 5-day course Unasyn 4/17  Orthostatic hypotension - Albumin given previously with minimal improvement - TED hose 12 hours on/12 hours off - NS 50 ml/hr; Tube feeds w/ free water flushes  Essential HTN/Ventricular tachycardia - Overnight reported patient had a couple runs of ventricular tachycardia in addition rise in BP.  - Most likely secondary to his multiple brain mets with hydrocephalus - Allow permissive HTN - Metoprolol 12.5 mg BID - Hydralazine PRN SBP> 170 or DBP> 110  Nausea/vomiting - 4/14 resolved   Nutrition;  - On tube feeding.  Through core track. - Swallow evaluation was attempted again today but patient was keeping pured diet and his mouth, current recommendations remained to maintain n.p.o. and continue feeding through tube - If patient needs long-term tube feeding-we will order PEG tube with IR. - Speech placed him on dysphagia 2 diet with thick liquid after getting a barium swallow, please see the full report-remain high risk for aspiration.  Diarrhea;  - Resolved after stopping laxative and change in his tube feed formula.  Acute metabolic encephalopathy, worsening in the setting of above Cannot rule out acute hospital delirium.   - Likely in the setting of acute respiratory failure, due to aspiration pneumonia and ICU delirium. - Continue Gabapentin,dose was reduced to 100 mg 3 times daily from 300 mg. - 4/14 resolved answered all questions appropriately.   Left femoral Neck Fx S/P Hemiarthroplasty:  - PT  following  Left Facial Fx, L 3 TP Fx:  - Neurosurgery  evaluated , no surgical intervention needed.  - Neurosurgery recommend ENT consult. - Spoke with ENT on called for [place] recommend Conservative management, Fracture is non displaced, does not need anything acute.   Rhabdomyolysis, POA - Resolved with IV fluids.   Thrombocytopenia, POA - Thrombocytopenia resolved   Normocytic Anemia, likely chronic anemia of chronic disease - 4/6 transfused 1 unit PRBC Lab Results  Component Value Date   HGB 8.7 (L) 10/29/2020   HGB 8.9 (L) 10/28/2020   HGB 9.0 (L) 10/27/2020   HGB 8.8 (L) 10/26/2020   HGB 10.2 (L) 10/25/2020  - 4/13 anemia consistent with normocytic anemia  Tobacco abuse -Needs counseling.   Sepsis was ruled out.   Sacral pressure ulcer, POA Pressure Injury 10/11/20 Sacrum Medial Deep Tissue Pressure Injury - Purple or maroon localized area of discolored intact skin or blood-filled blister due to damage of underlying soft tissue from pressure and/or shear. (Active)  10/11/20 0220  Location: Sacrum  Location Orientation: Medial  Staging: Deep Tissue Pressure Injury - Purple or maroon localized area of discolored intact skin or blood-filled blister due to damage of underlying soft tissue from pressure and/or shear.  Wound Description (Comments):   Present on Admission: Yes     Pressure Injury 10/15/20 Heel Right Deep Tissue Pressure Injury - Purple  or maroon localized area of discolored intact skin or blood-filled blister due to damage of underlying soft tissue from pressure and/or shear. (Active)  10/15/20 0323  Location: Heel  Location Orientation: Right  Staging: Deep Tissue Pressure Injury - Purple or maroon localized area of discolored intact skin or blood-filled blister due to damage of underlying soft tissue from pressure and/or shear.  Wound Description (Comments):   Present on Admission: No     Pressure Injury 10/27/20 Knee Anterior;Right;Lateral Deep Tissue  Pressure Injury - Purple or maroon localized area of discolored intact skin or blood-filled blister due to damage of underlying soft tissue from pressure and/or shear. (Active)  10/27/20 1941  Location: Knee  Location Orientation: Anterior;Right;Lateral  Staging: Deep Tissue Pressure Injury - Purple or maroon localized area of discolored intact skin or blood-filled blister due to damage of underlying soft tissue from pressure and/or shear.  Wound Description (Comments):   Present on Admission: Yes   Severe protein calorie malnutrition - Continue tube feeds  DVT prophylaxis: Subcu heparin Code Status: Full Family Communication: Spoke with patient today who wishes to follow-up with his 3 brothers and sister  Status is: Inpatient  Dispo: The patient is from: Home              Anticipated d/c is to: Hospice at home vs hospice house              Anticipated d/c date is: 48/72h              Patient currently unstable  Consultants:  Dr. Delton Coombes.[facility] oncology Dr. Ashok Pall neurosurgery Dr. Sondra Come radiation oncology    Procedures/Significant Events:  3/30 - admitted to [facility] for encephalopathy. Facial fx, L3 TP fx, Hip fx.  3/31 non op from [place] standpoint. Ortho planned to OR 3/31 however acute hypoxic respiratory failure, transferred to ICU and emergently intubated.  3/31 ETT >>4/1 3/31 bronchoscopy with BAL left lower lobe 4/1 LT Hip Hemiarthroplasty (Anterior Approach) 4/4 ETT>>4/6 4/12 Unasyn for concern of aspiration -now completed 4/16 -MRI brain showing metastatic disease  Cultures 4/4 tracheal aspirate positive rare Candida Krusei  4/4 MRSA by PCR negative   Antimicrobials: Anti-infectives (From admission, onward)   Start     Ordered Stop   10/23/20 1330  Ampicillin-Sulbactam (UNASYN) 3 g in sodium chloride 0.9 % 100 mL IVPB        10/23/20 1241 10/28/20 2359   10/15/20 2200  ceFEPIme (MAXIPIME) 2 g in sodium chloride 0.9 % 100 mL IVPB        10/15/20 1121  10/16/20 2332   10/15/20 0400  vancomycin (VANCOREADY) IVPB 1500 mg/300 mL  Status:  Discontinued        10/15/20 0300 10/15/20 1324   10/12/20 1254  vancomycin (VANCOCIN) powder  Status:  Discontinued        10/12/20 1255 10/12/20 1328   10/11/20 2100  vancomycin (VANCOREADY) IVPB 1250 mg/250 mL  Status:  Discontinued        10/10/20 2104 10/12/20 0834   10/11/20 1800  ceFEPIme (MAXIPIME) 2 g in sodium chloride 0.9 % 100 mL IVPB  Status:  Discontinued        10/11/20 1230 10/15/20 1121   10/11/20 1200  ceFAZolin (ANCEF) IVPB 2g/100 mL premix        10/11/20 1114 10/12/20 0559   10/10/20 2100  vancomycin (VANCOREADY) IVPB 1250 mg/250 mL        10/10/20 2058 10/10/20 2327   10/10/20 2100  ceFEPIme (MAXIPIME)  2 g in sodium chloride 0.9 % 100 mL IVPB  Status:  Discontinued        10/10/20 2058 10/11/20 1230      Continuous Infusions: . sodium chloride Stopped (10/25/20 0829)  . sodium chloride 50 mL/hr at 10/29/20 0539  . feeding supplement (JEVITY 1.5 CAL/FIBER) 1,000 mL (10/28/20 1710)   Objective: Vitals:   10/28/20 2059 10/28/20 2059 10/29/20 0435 10/29/20 0500  BP:  (!) 129/45 130/63   Pulse:  86 62   Resp:  16 16   Temp:  99.3 F (37.4 C) 97.7 F (36.5 C)   TempSrc:  Oral Oral   SpO2: (!) 88% (!) 88% 94%   Weight:    62.5 kg  Height:        Intake/Output Summary (Last 24 hours) at 10/29/2020 0758 Last data filed at 10/29/2020 0539 Gross per 24 hour  Intake 2817.5 ml  Output 1900 ml  Net 917.5 ml   Filed Weights   10/27/20 0717 10/28/20 0425 10/29/20 0500  Weight: 64.3 kg 61.6 kg 62.5 kg    Examination:  General: Alert to person only Eyes: negative scleral hemorrhage, negative anisocoria, negative icterus ENT: Negative Runny nose, negative gingival bleeding, coretrak in place Neck:  Negative scars, masses, torticollis, lymphadenopathy, JVD Lungs: Clear to auscultation bilaterally without wheezes or crackles Cardiovascular: Regular rate and rhythm without  murmur gallop or rub normal S1 and S2 Abdomen: negative abdominal pain, nondistended, positive soft, bowel sounds, no rebound, no ascites, no appreciable mass Extremities: No significant cyanosis, clubbing, or edema bilateral lower extremities Skin: Multiple facial bruises  Data Reviewed: Care during the described time interval was provided by me .  I have reviewed this patient's available data, including medical history, events of note, physical examination, and all test results as part of my evaluation.  CBC: Recent Labs  Lab 10/24/20 0904 10/25/20 0149 10/26/20 0404 10/27/20 0122 10/28/20 0854 10/29/20 0341  WBC 7.1 7.8 6.9 6.1 6.0 8.1  NEUTROABS 5.2 5.9 5.1 4.5 5.4  --   HGB 9.8* 10.2* 8.8* 9.0* 8.9* 8.7*  HCT 29.9* 30.9* 27.2* 27.8* 28.4* 27.4*  MCV 95.5 94.2 95.4 95.5 97.3 97.2  PLT 295 302 317 336 328 086   Basic Metabolic Panel: Recent Labs  Lab 10/24/20 0904 10/25/20 0149 10/26/20 0404 10/27/20 0122 10/28/20 0854 10/29/20 0341  NA 134* 136 137 138  --  139  K 4.3 4.2 4.2 4.5  --  4.5  CL 104 103 107 106  --  108  CO2 _0 --  25  GLUCOSE 99 118* 132* 124*  --  163*  BUN _1 --  27*  CREATININE 0.86 0.83 0.74 0.81  --  0.83  CALCIUM 8.5* 9.0 8.6* 8.9  --  9.2  MG 2.2 2.4 2.2 2.2 2.0 2.2  PHOS 4.2 4.1 4.0 3.9 3.1 3.4   GFR: Estimated Creatinine Clearance: 74.3 mL/min (by C-G formula based on SCr of 0.83 mg/dL). Liver Function Tests: Recent Labs  Lab 10/24/20 0904 10/25/20 0149 10/26/20 0404 10/27/20 0122 10/29/20 0341  AST _2 ALT 44 40 _3 ALKPHOS 72 73 63 63 61  BILITOT 0.8 0.4 0.3 0.3 0.5  PROT 5.8* 6.1* 5.7* 5.8* 6.2*  ALBUMIN 2.7* 2.9* 2.9* 2.9* 3.3*   No results for input(s): LIPASE, AMYLASE in the last 168 hours. No results for input(s): AMMONIA in the last 168 hours. Coagulation Profile: No results  for input(s): INR, PROTIME in the last 168 hours. Cardiac Enzymes: No results for input(s): CKTOTAL, CKMB,  CKMBINDEX, TROPONINI in the last 168 hours. BNP (last 3 results) No results for input(s): PROBNP in the last 8760 hours. HbA1C: No results for input(s): HGBA1C in the last 72 hours. CBG: Recent Labs  Lab 10/28/20 1703 10/28/20 2051 10/29/20 0022 10/29/20 0430 10/29/20 0738  GLUCAP 152* 145* 105* 163* 153*   Lipid Profile: No results for input(s): CHOL, HDL, LDLCALC, TRIG, CHOLHDL, LDLDIRECT in the last 72 hours. Thyroid Function Tests: No results for input(s): TSH, T4TOTAL, FREET4, T3FREE, THYROIDAB in the last 72 hours. Anemia Panel: No results for input(s): VITAMINB12, FOLATE, FERRITIN, TIBC, IRON, RETICCTPCT in the last 72 hours. Sepsis Labs: Recent Labs  Lab 10/23/20 1245 10/25/20 1704 10/25/20 2229 10/26/20 0404 10/27/20 0122  PROCALCITON <0.10 <0.10  --  <0.10 <0.10  LATICACIDVEN  --  1.0 1.0  --   --     No results found for this or any previous visit (from the past 240 hour(s)).   Radiology Studies: MR BRAIN W WO CONTRAST  Result Date: 10/27/2020 CLINICAL DATA:  70 year old male with history of metastatic small cell carcinoma. Encephalopathy after fall last month. Rhabdomyolysis. Unexplained neurologic deficits, dizziness, double vision. Restaging. EXAM: MRI HEAD WITHOUT AND WITH CONTRAST TECHNIQUE: Multiplanar, multiecho pulse sequences of the brain and surrounding structures were obtained without and with intravenous contrast. CONTRAST:  6.25m GADAVIST GADOBUTROL 1 MMOL/ML IV SOLN COMPARISON:  Head CT without contrast 10/15/2020 and earlier. Brain MRI 09/07/2019. CTA head and neck 10/10/2020, and earlier. FINDINGS: Brain: Ventriculomegaly, new since last year with dilated temporal horns. Up to mild transependymal edema. No restricted diffusion or evidence of acute infarction. But there is abnormal diffusion associated with multiple enhancing brain masses. The largest is a lobulated and heterogeneously enhancing 39 mm mass in the central lower cerebellum (series 11,  image 12). There are 2 additional nearby cerebellar metastases, 23 mm in the left superior and up to 24 mm in the right superior cerebellum (same image). Subsequent severe cerebellar edema. Partially effaced 4th ventricle and basilar cisterns. Partially effaced cerebral aqueduct. No tonsillar herniation. Additional round 13 mm right caudate metastasis on series 10, image 27. mild edema at that site. Each of the lesions demonstrates mild hemosiderin. No supratentorial midline shift. No dural thickening. Superimposed widespread chronic cerebral white matter T2 and FLAIR hyperintensity. Superimposed scattered chronic microhemorrhages elsewhere in the brain, likely small vessel related and not significantly changed since last year allowing for SWI technique today. No extra-axial collection or acute intracranial hemorrhage. Pituitary within normal limits. Vascular: Major intracranial vascular flow voids are stable since last year. Skull and upper cervical spine: Negative visible upper cervical spine, spinal cord. Visualized bone marrow signal is within normal limits. Sinuses/Orbits: Postoperative changes to both globes. Orbits appear stable and negative. Normal suprasellar cistern. Normal cavernous sinus. Paranasal Visualized paranasal sinuses and mastoids are stable and well aerated. Other: Chronic left mastoid effusion has mildly increased. Rim enhancing multi cystic appearing mass at the left fossa of Rosenmuller is new since 2018, and significantly increased since last year now measuring up to the 3 cm (series 11, image 19). This does not seem to directly correspond to what were thought to be small benign midline nasopharyngeal cysts in 2018. And this has progressed from a neck CT 09/03/2020 Other visible upper neck soft tissues appear negative. IMPRESSION: 1. Positive for metastatic disease to the brain, with several large cerebellar metastases, up to 3.9  cm. 2. Subsequent severe cerebellar edema with partially  effaced 4th ventricle and cerebral aqueduct, and associated hydrocephalus with mild transependymal edema. Recommend Neurosurgery consultation. 3. Four brain metastases overall, including a 13 mm right caudate lesion. 4. Suspected cystic metastasis or ex-nodal disease which is enlarging at the left nasopharynx, fossa of Rosenmuller since February. In August last year this lesion was suspected to be cystic/necrotic lymphadenopathy by Neck CT. Study discussed by telephone with Dr. Dia Crawford on 10/27/2020 at 11:35 . Electronically Signed   By: Genevie Ann M.D.   On: 10/27/2020 11:41    Scheduled Meds: . arformoterol  15 mcg Nebulization BID  . aspirin  81 mg Per Tube Daily  . chlorhexidine gluconate (MEDLINE KIT)  15 mL Mouth Rinse BID  . Chlorhexidine Gluconate Cloth  6 each Topical Daily  . dexamethasone (DECADRON) injection  4 mg Intravenous Q6H  . diphenoxylate-atropine  5 mL Oral Daily  . doxazosin  2 mg Per Tube Daily  . famotidine  20 mg Per Tube BID  . feeding supplement (PROSource TF)  45 mL Per Tube Daily  . free water  150 mL Per Tube Q4H  . gabapentin  100 mg Per Tube Q8H  . heparin injection (subcutaneous)  5,000 Units Subcutaneous Q8H  . mouth rinse  15 mL Mouth Rinse q12n4p  . melatonin  5 mg Oral QHS  . metoprolol tartrate  12.5 mg Oral BID  . mirtazapine  30 mg Per Tube QHS  . nicotine  14 mg Transdermal Daily  . revefenacin  175 mcg Nebulization Daily  . sodium chloride flush  10-40 mL Intracatheter Q12H   Continuous Infusions: . sodium chloride Stopped (10/25/20 0829)  . sodium chloride 50 mL/hr at 10/29/20 0539  . feeding supplement (JEVITY 1.5 CAL/FIBER) 1,000 mL (10/28/20 1710)     LOS: 19 days    Time spent:40 min   Little Ishikawa, DO [facility]   If 7PM-7AM, please contact night-coverage 10/29/2020, 7:58 AM

## 2020-10-29 NOTE — Progress Notes (Signed)
Daily Progress Note   Patient Name: [name]       Date: 10/29/2020 DOB: 12/21/1950  Age: 70 y.o. MRN#: [id number] Attending Physician: Little Ishikawa, MD Primary Care Physician: [name], MD Admit Date: 10/10/2020  Reason for Consultation/Follow-up: Establishing goals of care  Subjective: Patient in the process of going to radiation, he states that he had a lot of discussions with Dr Avon Gully today, regarding his illness, he will discuss with his brother as well. PMT to follow up tomorrow.       ROS  Length of Stay: 19  Current Medications: Scheduled Meds:  . arformoterol  15 mcg Nebulization BID  . aspirin  81 mg Per Tube Daily  . chlorhexidine gluconate (MEDLINE KIT)  15 mL Mouth Rinse BID  . Chlorhexidine Gluconate Cloth  6 each Topical Daily  . dexamethasone (DECADRON) injection  4 mg Intravenous Q6H  . diphenoxylate-atropine  5 mL Oral Daily  . doxazosin  2 mg Per Tube Daily  . famotidine  20 mg Per Tube BID  . feeding supplement (JEVITY 1.5 CAL/FIBER)  1,000 mL Per Tube Q24H  . feeding supplement (PROSource TF)  45 mL Per Tube Daily  . free water  150 mL Per Tube Q4H  . gabapentin  100 mg Per Tube Q8H  . heparin injection (subcutaneous)  5,000 Units Subcutaneous Q8H  . mouth rinse  15 mL Mouth Rinse q12n4p  . melatonin  5 mg Oral QHS  . metoprolol tartrate  12.5 mg Oral BID  . mirtazapine  30 mg Per Tube QHS  . nicotine  14 mg Transdermal Daily  . revefenacin  175 mcg Nebulization Daily  . sodium chloride flush  10-40 mL Intracatheter Q12H    Continuous Infusions: . sodium chloride Stopped (10/25/20 0829)  . sodium chloride 50 mL/hr at 10/29/20 0539    PRN Meds: sodium chloride, acetaminophen **OR** acetaminophen, albuterol, ALPRAZolam,  hydrALAZINE, insulin aspart, ondansetron (ZOFRAN) IV, Resource ThickenUp Clear, sodium chloride flush  Physical Exam         Awake alert Is thin and has muscle wasting Has NGT Regular work of breathing S 1 S 2  Abdomen not distended No edema  Vital Signs: BP (!) 161/68   Pulse 74   Temp 97.7 F (36.5 C) (Oral)   Resp 16  Ht 5' 9"  (1.753 m)   Wt 62.5 kg   SpO2 96%   BMI 20.35 kg/m  SpO2: SpO2: 96 % O2 Device: O2 Device: Room Air O2 Flow Rate: O2 Flow Rate (L/min): 2 L/min  Intake/output summary:   Intake/Output Summary (Last 24 hours) at 10/29/2020 1357 Last data filed at 10/29/2020 0539 Gross per 24 hour  Intake 2190.54 ml  Output 1400 ml  Net 790.54 ml   LBM: Last BM Date: 10/26/20 Baseline Weight: Weight: 66.7 kg Most recent weight: Weight: 62.5 kg       Palliative Assessment/Data: PPS: 50%      Patient Active Problem List   Diagnosis Date Noted  . Primary malignant neoplasm of lung with metastasis to brain ([place]) 10/27/2020  . Essential hypertension 10/27/2020  . Thrombocytopenia ([place]) 10/27/2020  . Tobacco abuse 10/27/2020  . Sacral pressure ulcer 10/25/2020  . Orthostatic hypotension 10/25/2020  . Oropharyngeal dysphagia   . Status post total replacement of left hip   . Non-traumatic rhabdomyolysis   . Fall   . Left-sided weakness   . Acute respiratory failure with hypoxia ([place])   . Protein-calorie malnutrition, severe 10/16/2020  . Closed fracture of left hip ([place])   . Pressure injury of skin 10/11/2020  . Sepsis without septic shock ([place])   . Left displaced femoral neck fracture ([place])   . Delirium   . Closed fracture of frontal sinus ([place])   . Closed fracture of transverse process of lumbar vertebra ([place])   . Goals of care, counseling/discussion 12/19/2019  . Iron deficiency anemia 09/12/2019  . 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 Lymphadenopathy of head and neck 01/22/2017  . Osteopenia 03/30/2015  . Clubbing of fingers  04/14/2014  . Hyperglycemia 10/20/2013  . Smoker   . Closed dislocation of acromioclavicular joint 04/11/2010  . BREAST MASS, LEFT 01/20/2008  . ONYCHOMYCOSIS, TOENAILS 10/20/2007  . MUSCLE CRAMPS 10/20/2007  . Hypercholesterolemia 05/28/2007  . CATARACT NOS 08/28/2006  . DISTURBANCE, VISUAL NOS 08/19/2006  . COPD (chronic obstructive pulmonary disease) with emphysema ([place]) 08/19/2006  . WITHDRAWAL, DRUG 06/24/2006  . ANXIETY 06/24/2006  . DEPRESSION 06/24/2006  . MYOCARDIAL INFARCTION, HX OF 06/24/2006  . Coronary atherosclerosis 06/24/2006  . CARDIAC ARRHYTHMIA 06/24/2006  . CONSTIPATION 06/24/2006  . OSTEOARTHRITIS 06/24/2006  . LOW BACK PAIN 06/24/2006  . 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 He remains confused. Has significant dysphagia and is currently NPO with coretrak in place. Palliative medicine consulted for goals of care for this very ill man with multiple comorbidities who is at high risk of decompensation and dying.  Assessment/Recommendations/Plan  Continue full scope/full code for now. Patient had extensive discussions with TRH MD, he tells me that it is a lot to think about. He will discuss with his brothers as well. At present, considering DNR and hospice discussions have also been initiated.  PMT will follow   Goals of Care and Additional Recommendations: Limitations on Scope of Treatment: Full Scope Treatment  Code Status: Full code  Prognosis:  guarded.   Discharge Planning: To Be Determined  Care plan was discussed with  patient.   Thank you for allowing the Palliative Medicine Team to assist in the care of this patient.   Total time: 25 mins Greater than 50%  of this time was spent counseling and coordinating care related to the above assessment and plan.  Loistine Chance MD.  Palliative Medicine   Please contact Palliative Medicine Team phone at [phone] for questions and concerns.

## 2020-10-29 NOTE — Telephone Encounter (Signed)
RETURNED PATIENT'S BROTHER'S PHONE CALL, SPOKE [place]

## 2020-10-29 NOTE — Progress Notes (Signed)
Nutrition Follow-up  DOCUMENTATION CODES:   Severe malnutrition in context of chronic illness  INTERVENTION:  - will change TF order: Jevity 1.5 @ 60 ml/hr x14 hours/day (1800-0800) with 45 ml Prosource TF once/day and 150 ml free water every 4 hours. - this regimen will provide 1300 kcal (72% minimum estimated kcal need), 64 grams protein (75% minimum estimated protein need), and 1538 ml free water.   NUTRITION DIAGNOSIS:   Severe Malnutrition related to chronic illness (small cell carcinoma of the lung) as evidenced by severe muscle depletion,severe fat depletion. -ongoing  GOAL:   Patient will meet greater than or equal to 90% of their needs -met with TF regimen  MONITOR:   PO intake,Supplement acceptance,TF tolerance,Labs,Weight trends  REASON FOR ASSESSMENT:   Consult Calorie Count  ASSESSMENT:   70 year old male who presented to the ED on 3/30 with AMS after a fall. Pt with unknown time down (1-3 days suspected). 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 Pt found to have left hip fracture, left L3 transverse process fracture, left frontal sinus fracture as well as rhabdomyolysis.  Significant Events: 3/31 - RR for respiratory distress, transferred to ICU, intubated, therapeutic bronchoscopy 4/1 - L hip hemiarthroplasty, application of incisional VAC, extubated 4/4 - RR due to acute hypoxemic respiratory failure, transferred to ICU, intubated 4/5 - pt vomited, TF held 4/6 - Cortrak placed, extubated 4/8 - pt pulled wound VAC off L hip 4/11 - s/p MBSS, dysphagia 2 diet with honey-thick liquids  4/14 - diet advanced to dysphagia 2 with nectar-thick liquids 4/15- Calorie Count ordered 4/16- transferred from [facility] to [place]   Patient laying in bed with no family or visitors present. Tech at bedside helping patient into a new gown after bathing him.    Breakfast tray on bedside table; he drank some coffee and took 3-4 bites of magic cup. He did not want anything more for breakfast. He states that he ate very little for dinner, bites.   No Calorie Count envelope on door or available in paper chart.   Talked with patient about the importance of nutrition. Patient is motivated to get better so he can see and spend time with his family members. He is interested in Cortrak being removed and understands that he needs to increase oral intake in order for this to be possible.   He is receiving Jevity 1.5 @ 60 ml/hr x24 hours/day with 45 ml Prosource TF once/day and 150 ml free water every 4 hours. This regimen is providing 2200 kcal, 103 grams protein, and 1094 ml free water.   Noted that TF was previously ordered to run over 12 hours/day but on 4/17 was changed to run over 24 hours/day.   Patient denies any abdominal pain/pressure or nausea this AM.  Weight has had slight fluctuations each day over the past 1 week. No edema currently present.     Labs reviewed; CBGs: 105, 163, 153 mg/dl, BUN: 27 mg/dl. Medications reviewed; 20 mg pepcid BID, sliding scale novolog. IVF; NS @ 50 ml/hr.   Diet Order:   Diet Order            DIET DYS 2 Room service appropriate? Yes with Assist; Fluid consistency: Nectar Thick  Diet effective now                 EDUCATION NEEDS:   Education needs have been addressed  Skin:  Skin  Assessment: Skin Integrity Issues: Skin Integrity Issues:: DTI,Incisions DTI: sacrum; R heel; R knee (newly documented 4/16) Stage I: - Incisions: L hip (4/1)  Last BM:  4/15 (type 2 x1)  Height:   Ht Readings from Last 1 Encounters:  10/11/20 5' 9"  (1.753 m)    Weight:   Wt Readings from Last 1 Encounters:  10/29/20 62.5 kg     Estimated Nutritional Needs:  Kcal:  1800-2000 Protein:  85-100 grams Fluid:  >/= 1.8 L     Jarome Matin, MS, RD, LDN, CNSC Inpatient Clinical Dietitian RD pager #  available in [place]  After hours/weekend pager # available in [facility]

## 2020-10-30 ENCOUNTER — Ambulatory Visit
Admit: 2020-10-30 | Discharge: 2020-10-30 | Disposition: A | Discharge status: Home / Self Care | Payer: Medicare HMO | Attending: Radiation Oncology | Admitting: Radiation Oncology

## 2020-10-30 ENCOUNTER — Encounter (HOSPITAL_COMMUNITY): Payer: Self-pay | Admitting: Nephrology

## 2020-10-30 ENCOUNTER — Inpatient Hospital Stay (HOSPITAL_COMMUNITY): Payer: Medicare HMO

## 2020-10-30 DIAGNOSIS — Z515 Encounter for palliative care: Secondary | ICD-10-CM | POA: Diagnosis not present

## 2020-10-30 DIAGNOSIS — C3490 Malignant neoplasm of unspecified part of unspecified bronchus or lung: Secondary | ICD-10-CM | POA: Diagnosis not present

## 2020-10-30 DIAGNOSIS — L89156 Pressure-induced deep tissue damage of sacral region: Secondary | ICD-10-CM | POA: Diagnosis not present

## 2020-10-30 DIAGNOSIS — L89159 Pressure ulcer of sacral region, unspecified stage: Secondary | ICD-10-CM | POA: Diagnosis not present

## 2020-10-30 DIAGNOSIS — Z7189 Other specified counseling: Secondary | ICD-10-CM | POA: Diagnosis not present

## 2020-10-30 DIAGNOSIS — E43 Unspecified severe protein-calorie malnutrition: Secondary | ICD-10-CM | POA: Diagnosis not present

## 2020-10-30 LAB — GLUCOSE, CAPILLARY
Glucose-Capillary: 172 mg/dL — ABNORMAL HIGH (ref 70–99)
Glucose-Capillary: 127 mg/dL — ABNORMAL HIGH (ref 70–99)
Glucose-Capillary: 116 mg/dL — ABNORMAL HIGH (ref 70–99)

## 2020-10-30 LAB — MAGNESIUM: Magnesium: 2.1 mg/dL (ref 1.7–2.4)

## 2020-10-30 LAB — PHOSPHORUS: Phosphorus: 3.1 mg/dL (ref 2.5–4.6)

## 2020-10-30 MED ORDER — MIRTAZAPINE 15 MG PO TABS
30.0000 mg | ORAL_TABLET | Freq: Every day | ORAL | Status: DC
  Administered 2020-10-30 – 2020-11-09 (×11): 30 mg via ORAL
  Filled 2020-10-30 (×12): qty 2

## 2020-10-30 MED ORDER — ACETAMINOPHEN 325 MG PO TABS
650.0000 mg | ORAL_TABLET | Freq: Four times a day (QID) | ORAL | Status: DC | PRN
  Administered 2020-10-30 – 2020-11-06 (×2): 650 mg via ORAL
  Filled 2020-10-30 (×2): qty 2

## 2020-10-30 MED ORDER — ACETAMINOPHEN 650 MG RE SUPP: 650.0000 mg | Freq: Four times a day (QID) | RECTAL | Status: DC | PRN

## 2020-10-30 MED ORDER — POLYETHYLENE GLYCOL 3350 17 G PO PACK
17.0000 g | PACK | Freq: Two times a day (BID) | ORAL | Status: DC | PRN
  Administered 2020-10-31: 17 g via ORAL
  Filled 2020-10-30: qty 1

## 2020-10-30 MED ORDER — DOXAZOSIN MESYLATE 2 MG PO TABS
2.0000 mg | ORAL_TABLET | Freq: Every day | ORAL | Status: DC
  Administered 2020-10-30 – 2020-11-10 (×12): 2 mg via ORAL
  Filled 2020-10-30 (×12): qty 1

## 2020-10-30 MED ORDER — ASPIRIN 81 MG PO CHEW
81.0000 mg | CHEWABLE_TABLET | Freq: Every day | ORAL | Status: DC
  Administered 2020-10-30 – 2020-11-02 (×4): 81 mg via ORAL
  Filled 2020-10-30 (×4): qty 1

## 2020-10-30 MED ORDER — GABAPENTIN 100 MG PO CAPS
100.0000 mg | ORAL_CAPSULE | Freq: Three times a day (TID) | ORAL | Status: DC
  Administered 2020-10-30 – 2020-11-10 (×32): 100 mg via ORAL
  Filled 2020-10-30 (×32): qty 1

## 2020-10-30 MED ORDER — ALPRAZOLAM 0.5 MG PO TABS
0.5000 mg | ORAL_TABLET | Freq: Three times a day (TID) | ORAL | Status: DC | PRN
  Administered 2020-10-30 – 2020-11-10 (×26): 0.5 mg via ORAL
  Filled 2020-10-30 (×29): qty 1

## 2020-10-30 MED ORDER — FAMOTIDINE 20 MG PO TABS
20.0000 mg | ORAL_TABLET | Freq: Two times a day (BID) | ORAL | Status: DC
  Administered 2020-10-30 – 2020-11-10 (×23): 20 mg via ORAL
  Filled 2020-10-30 (×23): qty 1

## 2020-10-30 NOTE — Progress Notes (Addendum)
Pt gave RN permission to speak with his sister Edwena Felty about his diagnosis, plan of care, and ongoing radiation treatments. He reported "you can talk to her about everything." Edwena Felty did report that she got a diagnosis of brain cancer from her brother Levada Dy.

## 2020-10-30 NOTE — Progress Notes (Signed)
RN discussed with the patient the conversation related to his fall that I had with his brother Levada Dy as noted previously. The patient reported "he is so ridiculous, his lawyer can't do anything. It's my own fault, I got out of bed." He then went on to say Levada Dy "is a hot head and this is why I do not want to go live with him."   RN re-educated pt on fall precautions and he repeated back to me "I know, call don't fall." Pt has fall alarm on and non-slip socks on. Floor mats remain in place.

## 2020-10-30 NOTE — Progress Notes (Signed)
This RN was able to get a hold of patient's brother Levada Dy after phone call attempt #3. Levada Dy expresses wanted to speak to the physician and that the patient should be "tied down" and should have been "tied to the bed for days." This RN explained to Levada Dy that the patient has not been in restraints the last 2 days that I have had him but his bed alarm has remained on and all fall precautions are in place. Nathaniel Man on the phone "I'm contacting my lawyer." RN notified MD.

## 2020-10-30 NOTE — Progress Notes (Signed)
0710 This RN heard pt's bed alarm going off and entered pt's room. He was in the floor lying on his back with c/o of left hip pain. He reported he did not hit his head. He is alert and oriented X 4. He reported he was late for his radiation therapy appointment and he was trying to get there. RN educated him that his radiation appointment is scheduled today at 11:15 and they will come pick him up and take him in the bed just like they did yesterday. VSS. He did pull his dobhoff out during the incident. MD [facility] notified. Charge RN Chancy Hurter notified. Attemped to contact his brother Levada Dy with no answer. Will continue trying to reach him. RN informed pt of today's plan of care. Pt verbalized understanding. Bed alarm in place upon exiting pt's room.     10/30/20 0719  What Happened  Was fall witnessed? No  Was patient injured? No  Patient found on floor  Found by Staff-comment Meryle Ready RN)  Stated prior activity other (comment) (he thought he was late for radiation treatment and was trying to go to radiation appointment.)  Follow Up  MD notified Holli Humbles MD  Time MD notified [phone]  Family notified Yes - comment  Time family notified 0721  Additional tests No  Simple treatment Ice  Progress note created (see row info) Yes  Adult Fall Risk Assessment  Risk Factor Category (scoring not indicated) History of more than one fall within 6 months before admission (document High fall risk)  Age 70  Fall History: Fall within 6 months prior to admission 5  Elimination; Bowel and/or Urine Incontinence 2  Elimination; Bowel and/or Urine Urgency/Frequency 0  Medications: includes PCA/Opiates, Anti-convulsants, Anti-hypertensives, Diuretics, Hypnotics, Laxatives, Sedatives, and Psychotropics 3  Patient Care Equipment 2  Mobility-Assistance 2  Mobility-Gait 2  Mobility-Sensory Deficit 0  Altered awareness of immediate physical environment 1  Impulsiveness 2  Lack of understanding of one's  physical/cognitive limitations 4  Total Score 24  Patient Fall Risk Level High fall risk  Adult Fall Risk Interventions  Required Bundle Interventions *See Row Information* High fall risk - low, moderate, and high requirements implemented  Additional Interventions Use of appropriate toileting equipment (bedpan, BSC, etc.);Room near nurses station  Screening for Fall Injury Risk (To be completed on HIGH fall risk patients) - Assessing Need for Floor Mats  Risk For Fall Injury- Criteria for Floor Mats Admitted as a result of a fall  Will Implement Floor Mats Yes  Vitals  Temp 98.1 F (36.7 C)  Temp Source Oral  BP 140/68  BP Location Right Leg  BP Method Automatic  Patient Position (if appropriate) Sitting  Pulse Rate Source Monitor  ECG Heart Rate 76  Resp 16  Oxygen Therapy  SpO2 94 %  O2 Device Room Air  Patient Activity (if Appropriate) In bed  Pulse Oximetry Type Intermittent  Pain Assessment  Pain Scale 0-10  Pain Score 3  Pain Type Acute pain  Pain Location Hip  Pain Orientation Left  Pain Descriptors / Indicators Aching  Pain Frequency Intermittent  Pain Onset Other (Comment) (post fall)  Patients Stated Pain Goal 0  Pain Intervention(s) Repositioned;Cold applied  PCA/Epidural/Spinal Assessment  Respiratory Pattern Regular;Unlabored  Neurological  Neuro (WDL) WDL  Level of Consciousness Alert  Orientation Level Oriented X4  Cognition Appropriate at baseline  Speech Clear  Pupil Assessment  No  Glasgow Coma Scale  Eye Opening 4  Best Verbal Response (NON-intubated) 5  Best Motor Response 6  Glasgow Coma Scale Score 15  Musculoskeletal  Musculoskeletal (WDL) X  Assistive Device BSC  Generalized Weakness Yes  Weight Bearing Restrictions No  LLE Weight Bearing WBAT  Musculoskeletal Details  LUE Weakness  RLE Weakness  LLE Limited movement  Left Hip Limited movement  Integumentary  Integumentary (WDL) X  Skin Color Pale;Other (Comment) (eccymotic)   Skin Condition Dry  Skin Integrity Surgical Incision (see LDA)  Ecchymosis Location Face  Ecchymosis Location Orientation Left  Ecchymosis Intervention Other (Comment) (assessed)  Skin Turgor Non-tenting  Pain Assessment  Date Pain First Started 10/30/20  Result of Injury Yes  Pain Assessment  Work-Related Injury No

## 2020-10-30 NOTE — Plan of Care (Signed)
  Problem: Self-Concept: Goal: Ability to maintain and perform role responsibilities to the fullest extent possible will improve Outcome: Adequate for Discharge

## 2020-10-30 NOTE — Progress Notes (Signed)
OT Cancellation Note  Patient Details Name: DEANTHONY MAULL MRN: [id number] DOB: 27-Aug-1950   Cancelled Treatment:    Reason Eval/Treat Not Completed: Other (comment) Patient had fall this morning, reporting L hip soreness. Patient due to radiation at 1130 this AM, if still having hip pain plan for imaging of L hip. Will defer OT at this time and check back 4/20 as able  Delbert Phenix OT OT pager: [place] 10/30/2020, 9:05 AM

## 2020-10-30 NOTE — Progress Notes (Signed)
While this RN was at lunch, this pt's brother came by and it was reported to me that he was very upset and wanted me to call him back about the patient's fall. I returned Kerry's call and he, again, wanted to know why the patient was not "tied down in restraints" After explaining to Levada Dy that our policy and goal of care is not to keep people restrained that are alert and oriented, not pulling at tubes, and are following commands. After explaining this, Levada Dy became emotional and tearful on the phone stating "I'm so sorry I'm just going through a lot right now. My brother's dying, my dog is dying, and I'm dealing with hospice." RN provided emotional support via telephone. Levada Dy reported "I appreciate nurses and doctor's...you all are angels in my book." This RN updated Levada Dy and the patient, while in the patients room, on the current plan of care. RN informed Levada Dy to contact me with any needs. All verbalized understanding. No further questions asked.

## 2020-10-30 NOTE — Progress Notes (Signed)
PT Cancellation Note  Patient Details Name: [name] MRN: [id number] DOB: 24-Dec-1950   Cancelled Treatment:    Reason Eval/Treat Not Completed: Medical issues which prohibited therapy;Pain limiting ability to participate, complains of hip pain. Will check back another time.    Claretha Cooper 10/30/2020, 9:09 AM  [place] Pager [phone] Office [phone]

## 2020-10-30 NOTE — Progress Notes (Signed)
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 Admitted to [facility] on 3/30 for encephalopathy following a fall with associated facial fractures.  Patient was found by brother on 3/30 after he had been unable to reach patient for unknown period of time (1 to 3 days suspected).  Patient fracture left frontal sinus, L3 transverse process, displaced angulated mildly comminuted left femoral neck fracture.  AKI with rhabdomyolysis.   3/31 patient develop worsening hypoxia and respiratory distress, intubated.   4/2 extubated and transferred out of the ICU on 4/3.  T 4/3--4/4 he developed increased work of breathing and increased oxygen needs requiring intubation. This was thought to be related to an aspiration event.  4/6 -  extubated 4/12 - questionable aspiration pneumonia, Unasyn initiated now completed Interim - worsening mental status, MRI of the brain on 4/16 shows newly diagnosed metastatic disease, transferred to [facility] for radiation oncology evaluation and steroids 4/18 - mental status improving, patient AOx4, somewhat sluggish but appropriate.  We had a lengthy discussion about goals of care, patient has not yet decided on goals of care, hospice, CODE STATUS as he is still dealing with trying to understand his new diagnosis and states he has not had a meaningful conversation with oncology about his prognosis and life expectancy.  Subjective: No acute issues or events overnight, this morning patient attempted to get out of bed without calling for staff and fell on his left hip, imaging unremarkable, orthopedic sidelined who agree given pain control and ongoing range of motion no further indication for imaging or intervention.  Patient's core track NG tube also incidentally removed during this fall, will not replace  as patient's p.o. intake improves.  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 [facility])   Left displaced femoral neck fracture ([place])   Closed fracture of transverse process of lumbar vertebra (HCC)   Pressure injury of skin   Closed fracture of left hip (HCC)   Protein-calorie malnutrition, severe   Status post total replacement of left hip   Non-traumatic rhabdomyolysis   Fall   Left-sided weakness   Acute respiratory failure with hypoxia (HCC)   Oropharyngeal dysphagia   Sacral pressure ulcer   Orthostatic hypotension   Primary malignant neoplasm of lung with metastasis to brain (HCC)   Essential hypertension   Thrombocytopenia (HCC)   Tobacco abuse  GOALS OF CARE -Discussed with palliative care for advancement of hospice and end-of-life care discussion in the next 24 - 48h; brother [name] is very understanding of the patient's current situation and prognosis. Lengthy talk today at bedside with the patient about his poor prognosis, ongoing palliative treatment and goals. He was unaware that he has a very advanced stage of metastatic disease with grim prognosis, he is unclear whether or not he wishes to continue with this modality of treatment if it is not curative, we also discussed possible need for discharge to location other than home given he lives alone at home and will likely not be able to care for himself in the interim or as his disease progresses. -We did discuss discharge to his brother's house versus hospice house, he does not wish to live with his brother, palliative care to continue this conversation for possible hospice house admission once  patient has completed inpatient therapies. -Lengthy discussion about CODE STATUS again today, he continues to state he does not wish to be kept "artificially alive" but does not want to make any firm decisions until he has discussed further with his brothers and sister.   Hopefully palliative care and oncology could further discuss CODE STATUS with the patient to explain his current prognosis and life expectancy.  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 Delton Coombes with oncology and Dr. Sondra Come with rads oncology - now at [facility] for ongoing palliative radiation - Dr. Ashok Pall neurosurgery does not believe patient currently needs shunt however states if radiation oncology requires shunt placement he is available for placement  Acute respiratory failure with hypoxia/Aspiration pneumonia/mucus plugging/likely history of emphysema requiring intubation/PPV, Resolving - 4/5 Mucus Plug likely the cause of acute hypoxia - Patient previously requiring Ventimask when sleeping given mouth breathing with NG tube in - follow clinically now that NG tube has been removed  Hx Small cell lung cancer  - S/p chemotherapy Lurbinectedin last cycle 09/12/20. - Extubated 4\6 - Completed 7 days of cefepime 10/16/20. - Brovana/Yupelri nebulizer treatments and PRN albuterol.  - received Solumedrol on 4/05 during mucus plug event, dexamethasone ongoing - Completed 5-day course Unasyn 4/17  Orthostatic hypotension - Albumin given previously with minimal improvement - TED hose 12 hours on/12 hours off - NS 50 ml/hr; Tube feeds w/ free water flushes  Essential HTN/Ventricular tachycardia - Overnight reported patient had a couple runs of ventricular tachycardia in addition rise in BP.  - Most likely secondary to his multiple brain mets with hydrocephalus - Allow permissive HTN - Metoprolol 12.5 mg BID - Hydralazine PRN SBP> 170 or DBP> 110  Nausea/vomiting - 4/14 resolved   Severe protein caloric malnutrition in the setting of above;  - NG tube/core track incidentally removed this morning after fall, will hold off on replacement and advance diet as tolerated. - Speech placed him on dysphagia 2 diet with thick liquid after getting a barium  swallow, please see the full report-remain high risk for aspiration. - Will not offer PEG tube placement given patient's advanced disease and grim prognosis  Diarrhea;  - Resolved after stopping laxative -tube feeds discontinued  Acute metabolic encephalopathy, resolving Cannot rule out acute hospital delirium.   - Likely in the setting of metastatic disease, acute respiratory failure, aspiration pneumonia and ICU delirium. - Continue Gabapentin,dose was reduced to 100 mg 3 times daily from 300 mg. -Remains somewhat slow to answer but more appropriate, ANO x4 for the past 48 hours  Left femoral Neck Fx S/P Hemiarthroplasty:  - PT following -Mechanical fall 10/30/2020, plain film unremarkable, orthopedic sidelined, no further imaging or intervention required  Left Facial Fx, L 3 TP Fx:  - Neurosurgery  evaluated , no surgical intervention needed.  - Neurosurgery recommend ENT consult. - Spoke with ENT on called for [place] recommend Conservative management, Fracture is non displaced, does not need anything acute.   Rhabdomyolysis, POA - Resolved with IV fluids.   Thrombocytopenia, POA - Thrombocytopenia resolved   Normocytic Anemia, likely chronic anemia of chronic disease - 4/6 transfused 1 unit PRBC Lab Results  Component Value Date   HGB 8.7 (L) 10/29/2020   HGB 8.9 (L) 10/28/2020   HGB 9.0 (L) 10/27/2020   HGB 8.8 (L) 10/26/2020   HGB 10.2 (L) 10/25/2020   Tobacco abuse -Counseling at bedside  Sacral pressure ulcer, POA Pressure Injury 10/11/20  Sacrum Medial Deep Tissue Pressure Injury - Purple or maroon localized area of discolored intact skin or blood-filled blister due to damage of underlying soft tissue from pressure and/or shear. (Active)  10/11/20 0220  Location: Sacrum  Location Orientation: Medial  Staging: Deep Tissue Pressure Injury - Purple or maroon localized area of discolored intact skin or blood-filled blister due to damage of underlying soft tissue from  pressure and/or shear.  Wound Description (Comments):   Present on Admission: Yes     Pressure Injury 10/15/20 Heel Right Deep Tissue Pressure Injury - Purple or maroon localized area of discolored intact skin or blood-filled blister due to damage of underlying soft tissue from pressure and/or shear. (Active)  10/15/20 0323  Location: Heel  Location Orientation: Right  Staging: Deep Tissue Pressure Injury - Purple or maroon localized area of discolored intact skin or blood-filled blister due to damage of underlying soft tissue from pressure and/or shear.  Wound Description (Comments):   Present on Admission: No     Pressure Injury 10/27/20 Knee Anterior;Right;Lateral Deep Tissue Pressure Injury - Purple or maroon localized area of discolored intact skin or blood-filled blister due to damage of underlying soft tissue from pressure and/or shear. (Active)  10/27/20 1941  Location: Knee  Location Orientation: Anterior;Right;Lateral  Staging: Deep Tissue Pressure Injury - Purple or maroon localized area of discolored intact skin or blood-filled blister due to damage of underlying soft tissue from pressure and/or shear.  Wound Description (Comments):   Present on Admission: Yes   Severe protein calorie malnutrition - Continue tube feeds  DVT prophylaxis: Subcu heparin Code Status: Full Family Communication: Spoke with patient today who wishes to follow-up with his 3 brothers and sister  Status is: Inpatient  Dispo: The patient is from: Home              Anticipated d/c is to: Hospice at home vs hospice house              Anticipated d/c date is: 48/72h              Patient currently unstable  Consultants:  Dr. Rowe Pavy palliative care Dr. Delton Coombes.[facility] oncology Dr. Ashok Pall neurosurgery Dr. Sondra Come radiation oncology   Procedures/Significant Events:  3/30 - admitted to [facility] for encephalopathy. Facial fx, L3 TP fx, Hip fx.  3/31 non op from [place] standpoint. Ortho planned to  OR 3/31 however acute hypoxic respiratory failure, transferred to ICU and emergently intubated.  3/31 ETT >>4/1 3/31 bronchoscopy with BAL left lower lobe 4/1 LT Hip Hemiarthroplasty (Anterior Approach) 4/4 ETT>>4/6 4/12 Unasyn for concern of aspiration -now completed 4/16 -MRI brain showing metastatic disease  Cultures 4/4 tracheal aspirate positive rare Candida Krusei  4/4 MRSA by PCR negative   Antimicrobials: Anti-infectives (From admission, onward)   Start     Ordered Stop   10/23/20 1330  Ampicillin-Sulbactam (UNASYN) 3 g in sodium chloride 0.9 % 100 mL IVPB        10/23/20 1241 10/28/20 2359   10/15/20 2200  ceFEPIme (MAXIPIME) 2 g in sodium chloride 0.9 % 100 mL IVPB        10/15/20 1121 10/16/20 2332   10/15/20 0400  vancomycin (VANCOREADY) IVPB 1500 mg/300 mL  Status:  Discontinued        10/15/20 0300 10/15/20 1324   10/12/20 1254  vancomycin (VANCOCIN) powder  Status:  Discontinued        10/12/20 1255 10/12/20 1328   10/11/20 2100  vancomycin (VANCOREADY) IVPB 1250 mg/250  mL  Status:  Discontinued        10/10/20 2104 10/12/20 0834   10/11/20 1800  ceFEPIme (MAXIPIME) 2 g in sodium chloride 0.9 % 100 mL IVPB  Status:  Discontinued        10/11/20 1230 10/15/20 1121   10/11/20 1200  ceFAZolin (ANCEF) IVPB 2g/100 mL premix        10/11/20 1114 10/12/20 0559   10/10/20 2100  vancomycin (VANCOREADY) IVPB 1250 mg/250 mL        10/10/20 2058 10/10/20 2327   10/10/20 2100  ceFEPIme (MAXIPIME) 2 g in sodium chloride 0.9 % 100 mL IVPB  Status:  Discontinued        10/10/20 2058 10/11/20 1230      Continuous Infusions: . sodium chloride Stopped (10/25/20 0829)  . sodium chloride 50 mL/hr at 10/29/20 2240   Objective: Vitals:   10/29/20 1959 10/29/20 2256 10/30/20 0425 10/30/20 0719  BP:  (!) 128/55 137/61 140/68  Pulse:  72 67   Resp:  20 16   Temp:  97.8 F (36.6 C) 98.2 F (36.8 C)   TempSrc:  Oral Oral   SpO2: 96% 93% 92%   Weight:   63.7 kg   Height:         Intake/Output Summary (Last 24 hours) at 10/30/2020 0731 Last data filed at 10/30/2020 0142 Gross per 24 hour  Intake 963.35 ml  Output 2175 ml  Net -1211.65 ml   Filed Weights   10/28/20 0425 10/29/20 0500 10/30/20 0425  Weight: 61.6 kg 62.5 kg 63.7 kg    Examination:  General: Alert to person place and situation Eyes: negative scleral hemorrhage, negative anisocoria, negative icterus ENT: Negative Runny nose, negative gingival bleeding, coretrak in place Neck:  Negative scars, masses, torticollis, lymphadenopathy, JVD Lungs: Clear to auscultation bilaterally without wheezes or crackles Cardiovascular: Regular rate and rhythm without murmur gallop or rub normal S1 and S2 Abdomen: negative abdominal pain, nondistended, positive soft, bowel sounds, no rebound, no ascites, no appreciable mass Extremities: No significant cyanosis, clubbing, or edema bilateral lower extremities Skin: Multiple facial bruises   CBC: Recent Labs  Lab 10/24/20 0904 10/25/20 0149 10/26/20 0404 10/27/20 0122 10/28/20 0854 10/29/20 0341  WBC 7.1 7.8 6.9 6.1 6.0 8.1  NEUTROABS 5.2 5.9 5.1 4.5 5.4  --   HGB 9.8* 10.2* 8.8* 9.0* 8.9* 8.7*  HCT 29.9* 30.9* 27.2* 27.8* 28.4* 27.4*  MCV 95.5 94.2 95.4 95.5 97.3 97.2  PLT 295 302 317 336 328 341   Basic Metabolic Panel: Recent Labs  Lab 10/24/20 0904 10/25/20 0149 10/26/20 0404 10/27/20 0122 10/28/20 0854 10/29/20 0341 10/30/20 0409  NA 134* 136 137 138  --  139  --   K 4.3 4.2 4.2 4.5  --  4.5  --   CL 104 103 107 106  --  108  --   CO2 _0 --  25  --   GLUCOSE 99 118* 132* 124*  --  163*  --   BUN _1 --  27*  --   CREATININE 0.86 0.83 0.74 0.81  --  0.83  --   CALCIUM 8.5* 9.0 8.6* 8.9  --  9.2  --   MG 2.2 2.4 2.2 2.2 2.0 2.2 2.1  PHOS 4.2 4.1 4.0 3.9 3.1 3.4 3.1   GFR: Estimated Creatinine Clearance: 75.7 mL/min (by C-G formula based on SCr of 0.83 mg/dL). Liver Function Tests: Recent Labs  Lab 10/24/20  9169  10/25/20 0149 10/26/20 0404 10/27/20 0122 10/29/20 0341  AST _0 ALT 44 40 _1 ALKPHOS 72 73 63 63 61  BILITOT 0.8 0.4 0.3 0.3 0.5  PROT 5.8* 6.1* 5.7* 5.8* 6.2*  ALBUMIN 2.7* 2.9* 2.9* 2.9* 3.3*   No results for input(s): LIPASE, AMYLASE in the last 168 hours. No results for input(s): AMMONIA in the last 168 hours. Coagulation Profile: No results for input(s): INR, PROTIME in the last 168 hours. Cardiac Enzymes: No results for input(s): CKTOTAL, CKMB, CKMBINDEX, TROPONINI in the last 168 hours. BNP (last 3 results) No results for input(s): PROBNP in the last 8760 hours. HbA1C: No results for input(s): HGBA1C in the last 72 hours. CBG: Recent Labs  Lab 10/29/20 1149 10/29/20 1552 10/29/20 1957 10/29/20 2351 10/30/20 0421  GLUCAP 138* 96 158* 147* 172*   Lipid Profile: No results for input(s): CHOL, HDL, LDLCALC, TRIG, CHOLHDL, LDLDIRECT in the last 72 hours. Thyroid Function Tests: No results for input(s): TSH, T4TOTAL, FREET4, T3FREE, THYROIDAB in the last 72 hours. Anemia Panel: No results for input(s): VITAMINB12, FOLATE, FERRITIN, TIBC, IRON, RETICCTPCT in the last 72 hours. Sepsis Labs: Recent Labs  Lab 10/23/20 1245 10/25/20 1704 10/25/20 2229 10/26/20 0404 10/27/20 0122  PROCALCITON <0.10 <0.10  --  <0.10 <0.10  LATICACIDVEN  --  1.0 1.0  --   --     No results found for this or any previous visit (from the past 240 hour(s)).   Radiology Studies: No results found.  Scheduled Meds: . arformoterol  15 mcg Nebulization BID  . aspirin  81 mg Per Tube Daily  . chlorhexidine gluconate (MEDLINE KIT)  15 mL Mouth Rinse BID  . Chlorhexidine Gluconate Cloth  6 each Topical Daily  . dexamethasone (DECADRON) injection  4 mg Intravenous Q6H  . diphenoxylate-atropine  5 mL Oral Daily  . doxazosin  2 mg Per Tube Daily  . famotidine  20 mg Per Tube BID  . feeding supplement (JEVITY 1.5 CAL/FIBER)  1,000 mL Per Tube Q24H  . feeding supplement  (PROSource TF)  45 mL Per Tube Daily  . free water  150 mL Per Tube Q4H  . gabapentin  100 mg Per Tube Q8H  . heparin injection (subcutaneous)  5,000 Units Subcutaneous Q8H  . mouth rinse  15 mL Mouth Rinse q12n4p  . melatonin  5 mg Oral QHS  . metoprolol tartrate  12.5 mg Oral BID  . mirtazapine  30 mg Per Tube QHS  . nicotine  14 mg Transdermal Daily  . revefenacin  175 mcg Nebulization Daily  . sodium chloride flush  10-40 mL Intracatheter Q12H   Continuous Infusions: . sodium chloride Stopped (10/25/20 0829)  . sodium chloride 50 mL/hr at 10/29/20 2240     LOS: 20 days    Time spent:40 min   Little Ishikawa, DO [facility]   If 7PM-7AM, please contact night-coverage 10/30/2020, 7:31 AM

## 2020-10-31 ENCOUNTER — Ambulatory Visit
Admit: 2020-10-31 | Discharge: 2020-10-31 | Disposition: A | Discharge status: Home / Self Care | Payer: Medicare HMO | Attending: Radiation Oncology | Admitting: Radiation Oncology

## 2020-10-31 DIAGNOSIS — S72002A Fracture of unspecified part of neck of left femur, initial encounter for closed fracture: Secondary | ICD-10-CM | POA: Diagnosis not present

## 2020-10-31 DIAGNOSIS — Z515 Encounter for palliative care: Secondary | ICD-10-CM | POA: Diagnosis not present

## 2020-10-31 DIAGNOSIS — Z7189 Other specified counseling: Secondary | ICD-10-CM | POA: Diagnosis not present

## 2020-10-31 DIAGNOSIS — W19XXXA Unspecified fall, initial encounter: Secondary | ICD-10-CM | POA: Diagnosis not present

## 2020-10-31 DIAGNOSIS — R0603 Acute respiratory distress: Secondary | ICD-10-CM | POA: Diagnosis not present

## 2020-10-31 DIAGNOSIS — I10 Essential (primary) hypertension: Secondary | ICD-10-CM | POA: Diagnosis not present

## 2020-10-31 DIAGNOSIS — C3490 Malignant neoplasm of unspecified part of unspecified bronchus or lung: Secondary | ICD-10-CM | POA: Diagnosis not present

## 2020-10-31 DIAGNOSIS — J9601 Acute respiratory failure with hypoxia: Secondary | ICD-10-CM | POA: Diagnosis not present

## 2020-10-31 LAB — PHOSPHORUS
Phosphorus: 4.8 mg/dL — ABNORMAL HIGH (ref 2.5–4.6)
Phosphorus: 4.1 mg/dL (ref 2.5–4.6)

## 2020-10-31 LAB — COMPREHENSIVE METABOLIC PANEL
ALT: 26 U/L (ref 0–44)
AST: 15 U/L (ref 15–41)
Albumin: 3.1 g/dL — ABNORMAL LOW (ref 3.5–5.0)
Alkaline Phosphatase: 64 U/L (ref 38–126)
Anion gap: 7 (ref 5–15)
BUN: 31 mg/dL — ABNORMAL HIGH (ref 8–23)
CO2: 23 mmol/L (ref 22–32)
Calcium: 9.1 mg/dL (ref 8.9–10.3)
Chloride: 106 mmol/L (ref 98–111)
Creatinine, Ser: 0.72 mg/dL (ref 0.61–1.24)
GFR, Estimated: >60 mL/min (ref >60)
Glucose, Bld: 116 mg/dL — ABNORMAL HIGH (ref 70–99)
Potassium: 4.2 mmol/L (ref 3.5–5.1)
Sodium: 136 mmol/L (ref 135–145)
Total Bilirubin: 0.4 mg/dL (ref 0.3–1.2)
Total Protein: 5.9 g/dL — ABNORMAL LOW (ref 6.5–8.1)

## 2020-10-31 LAB — CBC
HCT: 27.8 % — ABNORMAL LOW (ref 39.0–52.0)
Hemoglobin: 9 g/dL — ABNORMAL LOW (ref 13.0–17.0)
MCH: 31.7 pg (ref 26.0–34.0)
MCHC: 32.4 g/dL (ref 30.0–36.0)
MCV: 97.9 fL (ref 80.0–100.0)
Platelets: 297 10*3/uL (ref 150–400)
RBC: 2.84 MIL/uL — ABNORMAL LOW (ref 4.22–5.81)
RDW: 19.5 % — ABNORMAL HIGH (ref 11.5–15.5)
WBC: 8.1 10*3/uL (ref 4.0–10.5)
nRBC: 0 % (ref 0.0–0.2)

## 2020-10-31 LAB — MAGNESIUM
Magnesium: 2.1 mg/dL (ref 1.7–2.4)
Magnesium: 2.1 mg/dL (ref 1.7–2.4)

## 2020-10-31 NOTE — Progress Notes (Signed)
Daily Progress Note   Patient Name: [name]       Date: 10/31/2020 DOB: [birth year]  Age: 70 y.o. MRN#: [id number] Attending Physician: Kerney Elbe, DO Primary Care Physician: [name], MD Admit Date: 10/10/2020  Reason for Consultation/Follow-up: Establishing goals of care  Subjective: I met today with [name].  He was lying in bed in no distress.  We reviewed his clinical course and understanding of his condition.  He expressed understanding that he has metastatic disease that is not going to be cured.  He tells me has been discussing with Dr. Avon Gully and that he has been recommending consideration for limitations on his care in light of his newly discovered brain metastasis.  We talked about his treatments to this point in time and how discovery of brain metastasis changes overall picture for him.  He expresses understanding that he is going to continue to decline, however, he states that he needs time to process information and discuss further with his family.  He tells me that he understands reasoning for consideration for limitations of care, however, he feels that he needs a chance to talk with an oncologist prior to making decisions about his care plan.  Length of Stay: 21  Current Medications: Scheduled Meds:  . arformoterol  15 mcg Nebulization BID  . aspirin  81 mg Oral Daily  . chlorhexidine gluconate (MEDLINE KIT)  15 mL Mouth Rinse BID  . Chlorhexidine Gluconate Cloth  6 each Topical Daily  . dexamethasone (DECADRON) injection  4 mg Intravenous Q6H  . diphenoxylate-atropine  5 mL Oral Daily  . doxazosin  2 mg Oral Daily  . famotidine  20 mg Oral BID  . feeding supplement (PROSource TF)  45 mL Per Tube Daily  . gabapentin  100 mg Oral Q8H  .  heparin injection (subcutaneous)  5,000 Units Subcutaneous Q8H  . mouth rinse  15 mL Mouth Rinse q12n4p  . melatonin  5 mg Oral QHS  . metoprolol tartrate  12.5 mg Oral BID  . mirtazapine  30 mg Oral QHS  . nicotine  14 mg Transdermal Daily  . revefenacin  175 mcg Nebulization Daily  . sodium chloride flush  10-40 mL Intracatheter Q12H    Continuous Infusions: . sodium chloride Stopped (10/25/20 0829)  . sodium chloride 50  mL/hr at 10/30/20 2243    PRN Meds: sodium chloride, acetaminophen **OR** acetaminophen, albuterol, ALPRAZolam, hydrALAZINE, insulin aspart, ondansetron (ZOFRAN) IV, polyethylene glycol, Resource ThickenUp Clear, sodium chloride flush  Physical Exam         Awake alert Is thin and has muscle wasting Has NGT Regular work of breathing S 1 S 2  Abdomen not distended No edema  Vital Signs: BP (!) 131/59 (BP Location: Left Arm)   Pulse (!) 59   Temp 97.6 F (36.4 C) (Oral)   Resp 14   Ht _0  (1.753 m)   Wt 64.9 kg   SpO2 90%   BMI 21.13 kg/m  SpO2: SpO2: 90 % O2 Device: O2 Device: Room Air O2 Flow Rate: O2 Flow Rate (L/min): 2 L/min  Intake/output summary:   Intake/Output Summary (Last 24 hours) at 10/31/2020 0947 Last data filed at 10/31/2020 4010 Gross per 24 hour  Intake --  Output 1200 ml  Net -1200 ml   LBM: Last BM Date: 10/26/20 (per pt) Baseline Weight: Weight: 66.7 kg Most recent weight: Weight: 64.9 kg       Palliative Assessment/Data: PPS: 50%    Flowsheet Rows   Flowsheet Row Most Recent Value  Intake Tab   Referral Department Hospitalist  Unit at Time of Referral Intermediate Care Unit  Date Notified 10/20/20  Palliative Care Type New Palliative care  Reason for referral Clarify Goals of Care  Date of Admission 10/10/20  Date first seen by Palliative Care 10/21/20  # of days Palliative referral response time 1 Day(s)  # of days IP prior to Palliative referral 10  Clinical Assessment   Psychosocial & Spiritual  Assessment   Palliative Care Outcomes       Patient Active Problem List   Diagnosis Date Noted  . Primary malignant neoplasm of lung with metastasis to brain ([place]) 10/27/2020  . Essential hypertension 10/27/2020  . Thrombocytopenia ([place]) 10/27/2020  . Tobacco abuse 10/27/2020  . Sacral pressure ulcer 10/25/2020  . Orthostatic hypotension 10/25/2020  . Oropharyngeal dysphagia   . Status post total replacement of left hip   . Non-traumatic rhabdomyolysis   . Fall   . Left-sided weakness   . Acute respiratory failure with hypoxia ([place])   . Protein-calorie malnutrition, severe 10/16/2020  . Closed fracture of left hip ([place])   . Pressure injury of skin 10/11/2020  . Sepsis without septic shock ([place])   . Left displaced femoral neck fracture ([place])   . Delirium   . Closed fracture of frontal sinus ([place])   . Closed fracture of transverse process of lumbar vertebra ([place])   . Goals of care, counseling/discussion 12/19/2019  . Iron deficiency anemia 09/12/2019  . Small cell lung cancer in adult [facility]) 02/05/2017  . Lymphadenopathy of head and neck 01/22/2017  . Osteopenia 03/30/2015  . Clubbing of fingers 04/14/2014  . Hyperglycemia 10/20/2013  . Smoker   . Closed dislocation of acromioclavicular joint 04/11/2010  . BREAST MASS, LEFT 01/20/2008  . ONYCHOMYCOSIS, TOENAILS 10/20/2007  . MUSCLE CRAMPS 10/20/2007  . Hypercholesterolemia 05/28/2007  . CATARACT NOS 08/28/2006  . DISTURBANCE, VISUAL NOS 08/19/2006  . COPD (chronic obstructive pulmonary disease) with emphysema ([place]) 08/19/2006  . WITHDRAWAL, DRUG 06/24/2006  . ANXIETY 06/24/2006  . DEPRESSION 06/24/2006  . MYOCARDIAL INFARCTION, HX OF 06/24/2006  . Coronary atherosclerosis 06/24/2006  . CARDIAC ARRHYTHMIA 06/24/2006  . CONSTIPATION 06/24/2006  . OSTEOARTHRITIS 06/24/2006  . LOW BACK PAIN 06/24/2006  . INSOMNIA 06/24/2006    Palliative  Care Assessment & Plan   Patient Profile: 70 y.o.malewith past medical  history of SCLC with mets to liver- currently on treatment with Lurbinectedin and aloxi, COPD, CAD,admitted on3/30/2022after a fall (found down in home by his brother)- resulting in facial fractures and hip fracture.Had acute respiratory failure on 3/31 requiring intubation, extubated 4/2 with reintubation on 4/3 due to possible aspiration event, extubated 4/6. He remains confused. Has significant dysphagia and is currently NPO with coretrak in place. Palliative medicine consulted for goals of care for this very ill man with multiple comorbidities who is at high risk of decompensation and dying.  Assessment/Recommendations/Plan -Continue current full code/full scope of treatment plan for now.  He wants to discuss further with his family and is considering conversations he has had with myself and Dr. Avon Gully.  He tells me that he feels he needs to discuss further with his oncologist prior to making long-term decisions about goals of care.  He follows with Dr. Delton Coombes (at [facility]) and therefore he is not going to be able to discuss while Mr. Grays remains an inpatient.  We will continue to follow and continue conversation with him during admission.  Goals of Care and Additional Recommendations: Limitations on Scope of Treatment: Full Scope Treatment  Code Status: Full code  Prognosis:  guarded.   Discharge Planning: To Be Determined  Care plan was discussed with patient.   Thank you for allowing the Palliative Medicine Team to assist in the care of this patient.   Total time: 40 minutes Greater than 50%  of this time was spent counseling and coordinating care related to the above assessment and plan.  Micheline Rough, MD [place] [facility] [phone]  Please contact Palliative Medicine Team phone at [phone] for questions and concerns.

## 2020-10-31 NOTE — Plan of Care (Signed)
  Problem: Pain Management: Goal: Pain level will decrease Outcome: Completed/Met

## 2020-10-31 NOTE — Progress Notes (Signed)
Occupational Therapy Treatment Patient Details Name: [name] MRN: [id number] DOB: 1951/06/24 Today's Date: 10/31/2020    History of present illness Pt is 70 yo admitted 3/30 after fall and found by brother with unknown down time (1-3 days). Fractured L frontal sinus,L L3 transverse process, displaced angulated mildly comminuted L femoral neck fx. AKI with rhabdo. 3/31 pt with hypoxia requiring intubation, extubated 4/1. Pt s/p left anterior hip hemiarthroplasty 4/1. Re-intubated 4/4 for hypoxemia and transfered to ICU, extubated 10/17/20. Progress limited by orthostasis. 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 Patient reports dizziness on sitting and patient had one LOB to the right that he corrected with RUE. BP checked -145/66. Patient able to stand and take steps to the head of the bed with RW and verbal cues for technique. RN present for safety due to patient's dizziness.   Follow Up Recommendations  SNF;Supervision/Assistance - 24 hour    Equipment Recommendations  None recommended by OT    Recommendations for Other Services      Precautions / Restrictions Precautions Precautions: Fall Precaution Comments: Dizziness with transfers (cerebellar mets) Restrictions Weight Bearing Restrictions: No LLE Weight Bearing: Weight bearing as tolerated Other Position/Activity Restrictions: s/p L hemi arthroplasty, anterior approach, no precautions       Mobility Bed Mobility Overal bed mobility: Needs Assistance Bed Mobility: Supine to  Sit Rolling: Min guard Sidelying to sit: Min guard Supine to sit: Min assist     General bed mobility comments: Min assist for hand hold to transfer into sitting. Immediate dizziness and imbalance on sitting needing bed rail to steay himself.    Transfers Overall transfer level: Needs assistance Equipment used: Rolling walker (2 wheeled) Transfers: Sit to/from Stand Sit to Stand: Min guard;From elevated surface;+2 safety/equipment        Lateral/Scoot Transfers: Min guard General transfer comment: Patient able to stand from elevated bed height with RW and take 4-5 steps to head of the bed with verbal cue for each step. Therapist cueing patient to use UEs on walker to reduce wb through LLE.    Balance Overall balance assessment: Needs assistance Sitting-balance support: Single extremity supported Sitting balance-Leahy Scale: Poor Sitting balance - Comments: one LOB to the right - requires hand hold to maintain balance at side of bed. Postural control: Right lateral lean   Standing balance-Leahy Scale: Poor Standing balance comment: reliant on walker                           ADL either performed or assessed with clinical judgement   ADL   Eating/Feeding: Set up;Bed level Eating/Feeding Details (indicate cue type and reason): therapist in room and patient needed assistance to open containers but able to feed himself. Grooming: Set up;Bed level Grooming Details (indicate cue type and reason): washed face and hands after returning to supine                                     Vision Baseline Vision/History: Wears  glasses Wears Glasses: Reading only Vision Assessment?: No apparent visual deficits   Perception     Praxis      Cognition Arousal/Alertness: Awake/alert Behavior During Therapy: WFL for tasks assessed/performed Overall Cognitive Status: Within Functional Limits for tasks assessed                                  General Comments: Pt follows commands appropriately with slightly increased time, pleasant, conversational with therapist regarding family        Exercises General Exercises - Lower Extremity Long Arc Quad: Seated;AROM;Strengthening;Both;20 reps Hip ABduction/ADduction: Seated;AROM;Strengthening;Both;10 reps (therapist providing light resistance) Hip Flexion/Marching: Seated;AROM;Strengthening;20 reps (L AROM ~25%) Heel Raises: Seated;AROM;Strengthening;Both;20 reps   Shoulder Instructions       General Comments Pt reports dizziness upon arrival, continues with transfer to EOB, seated exercise and after return to supine, states "it stays the same" and denies worsening or improvement in dizziness with activities.    Pertinent Vitals/ Pain       Pain Assessment: Faces Faces Pain Scale: Hurts a little bit Pain Location: L hip with bed mobility Pain Descriptors / Indicators: Sore Pain Intervention(s): Monitored during session  Home Living                                          Prior Functioning/Environment              Frequency  Min 2X/week        Progress Toward Goals  OT Goals(current goals can now be found in the care plan section)  Progress towards OT goals: Progressing toward goals  Acute Rehab OT Goals Patient Stated Goal: to walk again OT Goal Formulation: With patient Time For Goal Achievement: 11/14/20 Potential to Achieve Goals: [place] Discharge plan remains appropriate    Co-evaluation                 AM-PAC OT "6 Clicks" Daily Activity     Outcome Measure   Help from another person eating meals?: A Little Help from another person taking care of personal grooming?: A Little Help from another person toileting, which includes using toliet, bedpan, or urinal?: Total Help from another person bathing (including washing, rinsing, drying)?: A Lot Help from another person to put on and taking off regular upper body  clothing?: A Little Help from another person to put on and taking off regular lower body clothing?: Total 6 Click Score: 13    End of Session Equipment Utilized During Treatment: Gait belt;Rolling walker  OT Visit Diagnosis: Unsteadiness on feet (R26.81);Cognitive communication deficit (R41.841);Pain Symptoms and signs involving cognitive functions: Cerebral infarction Pain - Right/Left: Left Pain - part of body: Leg   Activity Tolerance Patient tolerated treatment well   Patient Left in bed;with call bell/phone within reach;with bed alarm set;with restraints reapplied   Nurse Communication Mobility status        Time: 2725-3664 OT Time Calculation (min): 22 min  Charges: OT General Charges $OT Visit: 1 Visit OT Treatments $Therapeutic Activity: 8-22 mins  Derl Barrow, OTR/L [place]  Office [phone] Pager: [place] 10/31/2020, 12:27 PM

## 2020-10-31 NOTE — Progress Notes (Signed)
Daily Progress Note   Patient Name: [name]       Date: 10/31/2020 DOB: Apr 29, 1951  Age: 70 y.o. MRN#: [id number] Attending Physician: Kerney Elbe, DO Primary Care Physician: [name], MD Admit Date: 10/10/2020  Reason for Consultation/Follow-up: Establishing goals of care  Subjective: I saw and examined Mr. [name].  He was eating dinner at time of my encounter.  He reports feeling "fine, I suppose."  He spoke with Dr. Alfredia Ferguson who reached out to his oncologist today.  Mr. [name] is hoping to speak with Dr. Worthy Greer.  He again relays that he is not ready to discuss any sort of goals until he has had the opportunity to do so.  Length of Stay: 21  Current Medications: Scheduled Meds:  . arformoterol  15 mcg Nebulization BID  . aspirin  81 mg Oral Daily  . chlorhexidine gluconate (MEDLINE KIT)  15 mL Mouth Rinse BID  . Chlorhexidine Gluconate Cloth  6 each Topical Daily  . dexamethasone (DECADRON) injection  4 mg Intravenous Q6H  . diphenoxylate-atropine  5 mL Oral Daily  . doxazosin  2 mg Oral Daily  . famotidine  20 mg Oral BID  . feeding supplement (PROSource TF)  45 mL Per Tube Daily  . gabapentin  100 mg Oral Q8H  . heparin injection (subcutaneous)  5,000 Units Subcutaneous Q8H  . mouth rinse  15 mL Mouth Rinse q12n4p  . melatonin  5 mg Oral QHS  . metoprolol tartrate  12.5 mg Oral BID  . mirtazapine  30 mg Oral QHS  . nicotine  14 mg Transdermal Daily  . revefenacin  175 mcg Nebulization Daily  . sodium chloride flush  10-40 mL Intracatheter Q12H    Continuous Infusions: . sodium chloride Stopped (10/25/20 0829)  . sodium chloride 50 mL/hr at 10/30/20 2243    PRN Meds: sodium chloride, acetaminophen **OR** acetaminophen, albuterol, ALPRAZolam,  hydrALAZINE, insulin aspart, ondansetron (ZOFRAN) IV, polyethylene glycol, Resource ThickenUp Clear, sodium chloride flush  Physical Exam         Awake alert Is thin and has muscle wasting Regular work of breathing S 1 S 2  Abdomen not distended No edema  Vital Signs: BP 128/64 (BP Location: Left Arm)   Pulse 74   Temp 98.1 F (36.7 C) (Oral)   Resp  20   Ht 5' 9"  (1.753 m)   Wt 64.9 kg   SpO2 97%   BMI 21.13 kg/m  SpO2: SpO2: 97 % O2 Device: O2 Device: Room Air O2 Flow Rate: O2 Flow Rate (L/min): 2 L/min  Intake/output summary:   Intake/Output Summary (Last 24 hours) at 10/31/2020 2029 Last data filed at 10/31/2020 1948 Gross per 24 hour  Intake 240 ml  Output 1700 ml  Net -1460 ml   LBM: Last BM Date: 10/26/20 (per pt) Baseline Weight: Weight: 66.7 kg Most recent weight: Weight: 64.9 kg       Palliative Assessment/Data: PPS: 50%    Flowsheet Rows   Flowsheet Row Most Recent Value  Intake Tab   Referral Department Hospitalist  Unit at Time of Referral Intermediate Care Unit  Date Notified 10/20/20  Palliative Care Type New Palliative care  Reason for referral Clarify Goals of Care  Date of Admission 10/10/20  Date first seen by Palliative Care 10/21/20  # of days Palliative referral response time 1 Day(s)  # of days IP prior to Palliative referral 10  Clinical Assessment   Psychosocial & Spiritual Assessment   Palliative Care Outcomes       Patient Active Problem List   Diagnosis Date Noted  . Primary malignant neoplasm of lung with metastasis to brain ([place]) 10/27/2020  . Essential hypertension 10/27/2020  . Thrombocytopenia ([place]) 10/27/2020  . Tobacco abuse 10/27/2020  . Sacral pressure ulcer 10/25/2020  . Orthostatic hypotension 10/25/2020  . Oropharyngeal dysphagia   . Status post total replacement of left hip   . Non-traumatic rhabdomyolysis   . Fall   . Left-sided weakness   . Acute respiratory failure with hypoxia ([place])   . Protein-calorie  malnutrition, severe 10/16/2020  . Closed fracture of left hip ([place])   . Pressure injury of skin 10/11/2020  . Sepsis without septic shock ([place])   . Left displaced femoral neck fracture ([place])   . Delirium   . Closed fracture of frontal sinus ([place])   . Closed fracture of transverse process of lumbar vertebra ([place])   . Goals of care, counseling/discussion 12/19/2019  . Iron deficiency anemia 09/12/2019  . Small cell lung cancer in adult [facility]) 02/05/2017  . Lymphadenopathy of head and neck 01/22/2017  . Osteopenia 03/30/2015  . Clubbing of fingers 04/14/2014  . Hyperglycemia 10/20/2013  . Smoker   . Closed dislocation of acromioclavicular joint 04/11/2010  . BREAST MASS, LEFT 01/20/2008  . ONYCHOMYCOSIS, TOENAILS 10/20/2007  . MUSCLE CRAMPS 10/20/2007  . Hypercholesterolemia 05/28/2007  . CATARACT NOS 08/28/2006  . DISTURBANCE, VISUAL NOS 08/19/2006  . COPD (chronic obstructive pulmonary disease) with emphysema ([place]) 08/19/2006  . WITHDRAWAL, DRUG 06/24/2006  . ANXIETY 06/24/2006  . DEPRESSION 06/24/2006  . MYOCARDIAL INFARCTION, HX OF 06/24/2006  . Coronary atherosclerosis 06/24/2006  . CARDIAC ARRHYTHMIA 06/24/2006  . CONSTIPATION 06/24/2006  . OSTEOARTHRITIS 06/24/2006  . LOW BACK PAIN 06/24/2006  . 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 He remains confused. Has significant dysphagia and is currently NPO with coretrak in place. Palliative medicine consulted for goals of care for this very ill  man with multiple comorbidities who is at high risk of  decompensation and dying.  Assessment/Recommendations/Plan -Continue current care of full scope treatment. He continues to discuss with his family and reports needing to also discuss with his oncologist prior to making any changes to his care plan.  Goals of Care and Additional Recommendations: Limitations on Scope of Treatment: Full Scope Treatment  Code Status: Full code  Prognosis:  guarded.   Discharge Planning: To Be Determined  Care plan was discussed with patient.   Thank you for allowing the Palliative Medicine Team to assist in the care of this patient.   Total time: 20 minutes Greater than 50%  of this time was spent counseling and coordinating care related to the above assessment and plan.  Micheline Rough, MD [place] Team [phone]  Please contact Palliative Medicine Team phone at [phone] for questions and concerns.

## 2020-10-31 NOTE — Progress Notes (Signed)
Physical Therapy Treatment Patient Details Name: [name] MRN: [id number] DOB: Apr 13, 1951 Today's Date: 10/31/2020    History of Present Illness Pt is 70 yo admitted 3/30 after fall and found by brother with unknown down time (1-3 days). Fractured L frontal sinus,L L3 transverse process, displaced angulated mildly comminuted L femoral neck fx. AKI with rhabdo. 3/31 pt with hypoxia requiring intubation, extubated 4/1. Pt s/p left anterior hip hemiarthroplasty 4/1. Re-intubated 4/4 for hypoxemia and transfered to ICU, extubated 10/17/20. Progress limited by orthostasis. 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 Pt able to come to sitting EOB, prefers R lateral lean, cued for midline posture in sitting requiring bil HHA on mattress to support trunk in upright midline. Pt with decreased L hip flexion with exercises. Pt declines standing or ambulation due to performing earlier with OT and "not up to it now", but able to lateral scoot up to [facility] with min G for safety, noted increased lateral trunk shifting without physical assist to stabilize. Pt follows commands appropriately with slightly increased time and conversational regarding his siblings. Pt limited by dizziness that is constant, denies worsening with positional changes and exercises- RN notified. Pt would continue to benefit from acute PT interventions to improve strength and progress independence with mobility to reduce risk for falls.    Follow Up Recommendations  SNF;Supervision/Assistance - 24 hour     Equipment Recommendations  Rolling walker  with 5" wheels;3in1 (PT)    Recommendations for Other Services       Precautions / Restrictions Precautions Precautions: Fall Restrictions Weight Bearing Restrictions: No LLE Weight Bearing: Weight bearing as tolerated    Mobility  Bed Mobility Overal bed mobility: Needs Assistance Bed Mobility: Supine to Sit;Sit to Supine Rolling: Min guard Sidelying to sit: Min guard  General bed mobility comments: increased time, cues for hand placement and LLE mobility to minimize pain    Transfers  Transfers: Lateral/Scoot Transfers   Lateral/Scoot Transfers: Min guard General transfer comment: min G for safety, increased trunk sway laterally with lateral scoot, no LOB or physical assist  Ambulation/Gait  General Gait Details: pt declines to stand and take steps due to performing earlier with OT and "not up to it now"   Stairs             Wheelchair Mobility    Modified Rankin (Stroke Patients Only)       Balance Overall balance assessment: Needs assistance Sitting-balance support: Feet supported;Bilateral upper extremity supported Sitting balance-Leahy Scale: Poor Sitting balance - Comments: prefers R lateral lean, cued to return to midline with visual to improve, requires HHA on mattress to mainain upright sitting Postural control: Right lateral lean                                  Cognition Arousal/Alertness: Awake/alert Behavior During Therapy: WFL for tasks assessed/performed Overall Cognitive Status: Within Functional Limits for tasks assessed  General Comments: Pt follows commands appropriately with slightly increased time, pleasant, conversational with therapist regarding family      Exercises  General Exercises - Lower Extremity Long Arc Quad: Seated;AROM;Strengthening;Both;20 reps Hip ABduction/ADduction: Seated;AROM;Strengthening;Both;10 reps (therapist providing light resistance) Hip Flexion/Marching: Seated;AROM;Strengthening;20 reps (L  AROM ~25%) Heel Raises: Seated;AROM;Strengthening;Both;20 reps    General Comments General comments (skin integrity, edema, etc.): Pt reports dizziness upon arrival, continues with transfer to EOB, seated exercise and after return to supine, states "it stays the same" and denies worsening or improvement in dizziness with activities.      Pertinent Vitals/Pain Pain Assessment: Faces Faces Pain Scale: Hurts a little bit Pain Location: L hip with bed mobility Pain Descriptors / Indicators: Grimacing;Sore Pain Intervention(s): Limited activity within patient's tolerance;Monitored during session    Home Living                      Prior Function            PT Goals (current goals can now be found in the care plan section) Acute Rehab PT Goals Patient Stated Goal: to go home PT Goal Formulation: With patient/family Time For Goal Achievement: 11/10/20 Potential to Achieve Goals: [place] Progress towards PT goals: Progressing toward goals    Frequency    Min 3X/week      PT Plan Current plan remains appropriate    Co-evaluation              AM-PAC PT "6 Clicks" Mobility   Outcome Measure  Help needed turning from your back to your side while in a flat bed without using bedrails?: A Little Help needed moving from lying on your back to sitting on the side of a flat bed without using bedrails?: A Little Help needed moving to and from a bed to a chair (including a wheelchair)?: A Lot Help needed standing up from a chair using your arms (e.g., wheelchair or bedside chair)?: A Lot Help needed to walk in hospital room?: Total Help needed climbing 3-5 steps with a railing? : Total 6 Click Score: 12    End of Session   Activity Tolerance: Treatment limited secondary to medical complications (Comment) (limited by dizziness) Patient left: in bed;with call bell/phone within reach;with bed alarm set Nurse Communication: Mobility status;Other (comment) (dizziness) PT  Visit Diagnosis: Other abnormalities of gait and mobility (R26.89);Difficulty in walking, not elsewhere classified (R26.2);Muscle weakness (generalized) (M62.81)     Time: 9323-5573 PT Time Calculation (min) (ACUTE ONLY): 18 min  Charges:  $Therapeutic Exercise: 8-22 mins                      Tori Shloka Baldridge PT, DPT 10/31/20, 11:06 AM

## 2020-10-31 NOTE — Progress Notes (Addendum)
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 Admitted to [facility] on 3/30 for encephalopathy following a fall with associated facial fractures. Patient was found by brother on 3/30 after he had been unable to reach patient for unknown period of time (1 to 3 days suspected). Patient fracture left frontal sinus, L3 transverse process, displaced angulated mildly comminuted left femoral neck fracture. AKI with rhabdomyolysis.   3/31 patient develop worsening hypoxia and respiratory distress, intubated.  4/2 extubated and transferred out of the ICU on 4/3. T 4/3--4/4 he developed increased work of breathing and increased oxygen needs requiring intubation. This was thought to be related to an aspiration event.  4/6 -  extubated 4/12 - questionable aspiration pneumonia, Unasyn initiated now completed Interim - worsening mental status, MRI of the brain on 4/16 shows newly diagnosed metastatic disease, transferred to [facility] for radiation oncology evaluation and steroids 4/18 - mental status improving, patient AOx4, somewhat sluggish but appropriate.  We had a lengthy discussion about goals of care, patient has not yet decided on goals of care, hospice, CODE STATUS as he is still dealing with trying to understand his new diagnosis and states he has not had a meaningful conversation with oncology about his prognosis and life expectancy.  **Interim History  On 10/30/20 the patient attempted to get out of bed without calling for staff to help and fell on the floor. Repeat Imaging done and unremarkable.  PT OT still recommending skilled nursing facility.  Patient mentation looks like it is improving.  Palliative care to have further goals of care discussion and patient's primary oncologist to call the patient and  discuss further options.  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 [facility])   Left displaced femoral neck fracture (HCC)   Closed fracture of transverse process of lumbar vertebra (HCC)   Pressure injury of skin   Closed fracture of left hip (HCC)   Protein-calorie malnutrition, severe   Status post total replacement of left hip   Non-traumatic rhabdomyolysis   Fall   Left-sided weakness   Acute respiratory failure with hypoxia (HCC)   Oropharyngeal dysphagia   Sacral pressure ulcer   Orthostatic hypotension   Primary malignant neoplasm of lung with metastasis to brain [facility])   Essential hypertension   Thrombocytopenia (HCC)   Tobacco abuse  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 Delton Coombes with oncology and Dr. Sondra Come with rads oncology  -Currently now at [facility] for ongoing palliative radiation -Dr. Ashok Pall neurosurgery does not believe patient currently needs shunt however states if radiation oncology requires shunt placement he is available for placement -Neurology recommends continuing radiation brain treatments and having the patient be discharged to the skilled nursing facility -C/w Supportive care  -Currently getting Radiation TX and will receive a total of 10 Tx total   Acute respiratory failure with hypoxia/Aspiration pneumonia/mucus plugging/likely history of emphysema requiring intubation/PPV, Resolving - 4/5 Mucus Plug likely the cause of acute hypoxia - Patient previously requiring Ventimask when sleeping given mouth breathing with NG tube in - follow clinically now that NG tube has been removed  Hx Small cell lung cancer  -S/p chemotherapy Lurbinectedin last cycle 09/12/20. -Extubated 4\6 -Completed 7 days of  cefepime 10/16/20. - Brovana/Yupelri nebulizer treatments and PRN albuterol.  -received Solumedrol on 4/05 during mucus plug event, dexamethasone ongoing -Completed 5-day course Unasyn  4/17  Orthostatic Hypotension - Albumin given previously with minimal improvement -C/w TED hose 12 hours on/12 hours off - NS 50 ml/hr; Tube feeds w/ free water flushes were initiated but now stopped   Essential HTN/Ventricular tachycardia - The night before last reported patient had a couple runs of ventricular tachycardia in addition rise in BP.  -Most likely secondary to his multiple brain mets with hydrocephalus - Allow permissive HTN - Metoprolol 12.5 mg BID - Hydralazine PRN SBP> 170 or DBP> 110  Nausea/vomiting - 4/14 resolved   Diarrhea;  - Resolved after stopping laxative -tube feeds discontinued; See below   Acute metabolic encephalopathy, resolving Cannot rule out acute hospital delirium.  - Likely in the setting of metastatic disease, acute respiratory failure, aspiration pneumonia and ICU delirium. - Continue Gabapentin,dose was reduced to 100 mg 3 times daily from 300 mg. -Remains somewhat slow to answer but more appropriate, ANO x4 for the past 72 hours -Palliative Care consulted for [place] Discussion -Patient getting Radiation Tx  Left femoral Neck Fx S/P Hemiarthroplasty:  -PT following -Mechanical fall 10/30/2020, plain film unremarkable, orthopedic sidelined, no further imaging or intervention required -Continue to Monitor and C/w Pain Control   Left Facial Fx, L 3 TP Fx:  -Neurosurgery evaluated , no surgical intervention needed.  -Neurosurgery recommend ENT consult. -Spoke with ENT on called for [place] recommend Conservative management, Fracture is non displaced, does not need anything acute.   Rhabdomyolysis, POA -Resolved with IV fluids.   Thrombocytopenia, POA -Thrombocytopenia has now resolved and last Platelet Count was 297 -Continue to Monitor for S/Sx of Bleeding; No overt bleeding noted -Repeat CBC in the AM   Normocytic Anemia, likely chronic anemia of chronic disease - 4/6 transfused 1 unit PRBC -Patient's Hgb/Hct went from 8.7/27.4  -> 9.0/27.8 the last few days -Continue to Monitor for S/Sx of Bleeding -No overt bleeding noted  GERD -C/w Famotidine 20 mg po BID   Tobacco Abuse -Smoking Cessasstion counseling given  -C/w Nicotine Patch 14 mg TD Daily   Sacral pressure ulcer, POA Pressure Injury 10/11/20 Sacrum Medial Deep Tissue Pressure Injury - Purple or maroon localized area of discolored intact skin or blood-filled blister due to damage of underlying soft tissue from pressure and/or shear. (Active)  10/11/20 0220  Location: Sacrum  Location Orientation: Medial  Staging: Deep Tissue Pressure Injury - Purple or maroon localized area of discolored intact skin or blood-filled blister due to damage of underlying soft tissue from pressure and/or shear.  Wound Description (Comments):   Present on Admission: Yes     Pressure Injury 10/15/20 Heel Right Deep Tissue Pressure Injury - Purple or maroon localized area of discolored intact skin or blood-filled blister due to damage of underlying soft tissue from pressure and/or shear. (Active)  10/15/20 0323  Location: Heel  Location Orientation: Right  Staging: Deep Tissue Pressure Injury - Purple or maroon localized area of discolored intact skin or blood-filled blister due to damage of underlying soft tissue from pressure and/or shear.  Wound Description (Comments):   Present on Admission: No     Pressure Injury 10/27/20 Knee Anterior;Right;Lateral Deep Tissue Pressure Injury - Purple or maroon localized area of discolored intact skin or blood-filled blister due to damage of underlying soft tissue from pressure and/or shear. (Active)  10/27/20 1941  Location: Knee  Location Orientation: Anterior;Right;Lateral  Staging: Deep  Tissue Pressure Injury - Purple or maroon localized area of discolored intact skin or blood-filled blister due to damage of underlying soft tissue from pressure and/or shear.  Wound Description (Comments):   Present on Admission: Yes   Severe  protein calorie malnutrition -Dietitian consulted and evaluated ad recommending Changing TF to Jevity 1.5 @ 60 mL/hr x 14 hours a day with Prosource TF once/day and 150 mL Free Water q4h -NG tube/core track incidentally removed yesterday morning after fall, will hold off on replacement and advance diet as tolerated. - Speech placed him on dysphagia 2 diet with thick liquid after getting a barium swallow, please see the full report-remains high risk for aspiration. - Will not offer PEG tube placement given patient's advanced disease and grim prognosis -C/w Mirtazapine 30 mg po qHS for Appetiti   GOALS OF CARE -Discussed with palliative care for advancement of hospice and end-of-life care discussion in the next 24 - 48h however his primary oncologist will call the patient this afternoon; brother Levada Dy is very understanding of the patient's current situation and prognosis.  Dr. Avon Gully had a lengthy talk at bedside with the patient about his poor prognosis, ongoing palliative treatment and goals.  Patient was unaware that he has a very advanced stage of metastatic disease with grim prognosis, he is unclear whether or not he wishes to continue with this modality of treatment if it is not curative, we also discussed possible need for discharge to location other than home given he lives alone at home and will likely not be able to care for himself in the interim or as his disease progresses. -We did discuss discharge to his brother's house versus hospice house, he does not wish to live with his brother, palliative care to continue this conversation for possible hospice house admission once patient has completed inpatient therapies however his primary oncologist is advocating for SNF -Following up Discssions daily as he continues to state he does not wish to be kept "artificially alive" but does not want to make any firm decisions until he has discussed further with his brothers and sister.  Hopefully  palliative care and oncology could further discuss CODE STATUS with the patient to explain his current prognosis and life expectancy.  DVT prophylaxis: SCDS; Heparin 5,000 units sq q8h Code Status: FULL CODE  Family Communication: No family present at bedside  Disposition Plan: SNF if improved but continues to have Palliative Care Discussions  Status is: Inpatient  Remains inpatient appropriate because:Unsafe d/c plan, IV treatments appropriate due to intensity of illness or inability to take PO and Inpatient level of care appropriate due to severity of illness   Dispo:  Patient From:   Home  Planned Disposition: To be determined but likely SNF vs. Hospice depending on further palliative discussions  Medically stable for discharge:   NO    Consultants:   ENT  Neurosurgery  Palliative Care Medicine  Radiation Oncology  Medical Oncology consulted   Procedures/Significant Events:  3/30 - admitted to [facility] for encephalopathy. Facial fx, L3 TP fx, Hip fx.  3/31 non op from Andrew standpoint. Ortho planned to OR 3/31 however acute hypoxic respiratory failure, transferred to ICU and emergently intubated.  3/31 ETT >>4/1 3/31 bronchoscopy with BAL left lower lobe 4/1 LT Hip Hemiarthroplasty (Anterior Approach) 4/4 ETT>>4/6 4/12Unasyn for concern of aspiration -now completed 4/16 -MRI brain showing metastatic disease  Cultures 4/4 tracheal aspirate positive rare Candida Krusei  4/4 MRSA by PCR negative  Antimicrobials:  Anti-infectives (From admission,  onward)   Start     Dose/Rate Route Frequency Ordered Stop   10/23/20 1330  Ampicillin-Sulbactam (UNASYN) 3 g in sodium chloride 0.9 % 100 mL IVPB        3 g 200 mL/hr over 30 Minutes Intravenous Every 6 hours 10/23/20 1241 10/28/20 2359   10/15/20 2200  ceFEPIme (MAXIPIME) 2 g in sodium chloride 0.9 % 100 mL IVPB        2 g 200 mL/hr over 30 Minutes Intravenous Every 12 hours 10/15/20 1121 10/16/20 2332   10/15/20 0400   vancomycin (VANCOREADY) IVPB 1500 mg/300 mL  Status:  Discontinued        1,500 mg 150 mL/hr over 120 Minutes Intravenous Every 24 hours 10/15/20 0300 10/15/20 1324   10/12/20 1254  vancomycin (VANCOCIN) powder  Status:  Discontinued          As needed 10/12/20 1255 10/12/20 1328   10/11/20 2100  vancomycin (VANCOREADY) IVPB 1250 mg/250 mL  Status:  Discontinued        1,250 mg 166.7 mL/hr over 90 Minutes Intravenous Every 24 hours 10/10/20 2104 10/12/20 0834   10/11/20 1800  ceFEPIme (MAXIPIME) 2 g in sodium chloride 0.9 % 100 mL IVPB  Status:  Discontinued        2 g 200 mL/hr over 30 Minutes Intravenous Every 8 hours 10/11/20 1230 10/15/20 1121   10/11/20 1200  ceFAZolin (ANCEF) IVPB 2g/100 mL premix        2 g 200 mL/hr over 30 Minutes Intravenous On call to O.R. 10/11/20 1114 10/12/20 0559   10/10/20 2100  vancomycin (VANCOREADY) IVPB 1250 mg/250 mL        1,250 mg 166.7 mL/hr over 90 Minutes Intravenous  Once 10/10/20 2058 10/10/20 2327   10/10/20 2100  ceFEPIme (MAXIPIME) 2 g in sodium chloride 0.9 % 100 mL IVPB  Status:  Discontinued        2 g 200 mL/hr over 30 Minutes Intravenous Every 12 hours 10/10/20 2058 10/11/20 1230        Subjective: Seen and examined and had some hip soreness where his incisions were after his fall.  No chest pain, lightheadedness or dizziness.  Denies any chest pain or shortness of breath.  Mentation seems appropriate currently.  No other concerns or complaints at this time.  Objective: Vitals:   10/31/20 0500 10/31/20 0746 10/31/20 1006 10/31/20 1241  BP:   (!) 146/88 128/64  Pulse:   84 74  Resp:    20  Temp:    98.1 F (36.7 C)  TempSrc:    Oral  SpO2:  90%  97%  Weight: 64.9 kg     Height:        Intake/Output Summary (Last 24 hours) at 10/31/2020 1533 Last data filed at 10/31/2020 1749 Gross per 24 hour  Intake --  Output 1200 ml  Net -1200 ml   Filed Weights   10/29/20 0500 10/30/20 0425 10/31/20 0500  Weight: 62.5 kg 63.7 kg  64.9 kg   Examination: Physical Exam:  Constitutional: Thin chronically ill-appearing Caucasian male currently no acute distress appears calm Eyes: Lids and conjunctivae normal, sclerae anicteric  ENMT: External Ears, Nose appear normal. Grossly normal hearing.  Neck: Appears normal, supple, no cervical masses, normal ROM, no appreciable thyromegaly; no JVD Respiratory: Diminished to auscultation bilaterally with coarse breath sounds, no wheezing, rales, rhonchi or crackles. Normal respiratory effort and patient is not tachypenic. No accessory muscle use. Unlabored breathing  Cardiovascular: RRR, no  murmurs / rubs / gallops. No carotid bruits.  Abdomen: Soft, non-tender, non-distended. Bowel sounds positive.  GU: Deferred. Musculoskeletal: No clubbing / cyanosis of digits/nails. No joint deformity upper and lower extremities. Hip Incision appears C/D/I Skin: Has Sacral Ulcers. No induration; Warm and dry.  Neurologic: CN 2-12 grossly intact with no focal deficits. Romberg sign and cerebellar reflexes not assessed.  Psychiatric: Normal judgment and insight. Alert and oriented x 3. Normal mood and appropriate affect.   Data Reviewed: I have personally reviewed following labs and imaging studies  CBC: Recent Labs  Lab 10/25/20 0149 10/26/20 0404 10/27/20 0122 10/28/20 0854 10/29/20 0341 10/31/20 0345  WBC 7.8 6.9 6.1 6.0 8.1 8.1  NEUTROABS 5.9 5.1 4.5 5.4  --   --   HGB 10.2* 8.8* 9.0* 8.9* 8.7* 9.0*  HCT 30.9* 27.2* 27.8* 28.4* 27.4* 27.8*  MCV 94.2 95.4 95.5 97.3 97.2 97.9  PLT 302 317 336 328 319 656   Basic Metabolic Panel: Recent Labs  Lab 10/25/20 0149 10/26/20 0404 10/27/20 0122 10/28/20 0854 10/29/20 0341 10/30/20 0409 10/31/20 0345 10/31/20 0807  NA 136 137 138  --  139  --   --  136  K 4.2 4.2 4.5  --  4.5  --   --  4.2  CL 103 107 106  --  108  --   --  106  CO2 _0 --  25  --   --  23  GLUCOSE 118* 132* 124*  --  163*  --   --  116*  BUN _1 --   27*  --   --  31*  CREATININE 0.83 0.74 0.81  --  0.83  --   --  0.72  CALCIUM 9.0 8.6* 8.9  --  9.2  --   --  9.1  MG 2.4 2.2 2.2 2.0 2.2 2.1 2.1 2.1  PHOS 4.1 4.0 3.9 3.1 3.4 3.1 4.8* 4.1   GFR: Estimated Creatinine Clearance: 80 mL/min (by C-G formula based on SCr of 0.72 mg/dL). Liver Function Tests: Recent Labs  Lab 10/25/20 0149 10/26/20 0404 10/27/20 0122 10/29/20 0341 10/31/20 0807  AST _2 ALT 40 _3 ALKPHOS 73 63 63 61 64  BILITOT 0.4 0.3 0.3 0.5 0.4  PROT 6.1* 5.7* 5.8* 6.2* 5.9*  ALBUMIN 2.9* 2.9* 2.9* 3.3* 3.1*   No results for input(s): LIPASE, AMYLASE in the last 168 hours. No results for input(s): AMMONIA in the last 168 hours. Coagulation Profile: No results for input(s): INR, PROTIME in the last 168 hours. Cardiac Enzymes: No results for input(s): CKTOTAL, CKMB, CKMBINDEX, TROPONINI in the last 168 hours. BNP (last 3 results) No results for input(s): PROBNP in the last 8760 hours. HbA1C: No results for input(s): HGBA1C in the last 72 hours. CBG: Recent Labs  Lab 10/29/20 1957 10/29/20 2351 10/30/20 0421 10/30/20 0746 10/30/20 1156  GLUCAP 158* 147* 172* 127* 116*   Lipid Profile: No results for input(s): CHOL, HDL, LDLCALC, TRIG, CHOLHDL, LDLDIRECT in the last 72 hours. Thyroid Function Tests: No results for input(s): TSH, T4TOTAL, FREET4, T3FREE, THYROIDAB in the last 72 hours. Anemia Panel: No results for input(s): VITAMINB12, FOLATE, FERRITIN, TIBC, IRON, RETICCTPCT in the last 72 hours. Sepsis Labs: Recent Labs  Lab 10/25/20 1704 10/25/20 2229 10/26/20 0404 10/27/20 0122  PROCALCITON <0.10  --  <0.10 <0.10  LATICACIDVEN 1.0 1.0  --   --     No results  found for this or any previous visit (from the past 240 hour(s)).   RN Pressure Injury Documentation: Pressure Injury 10/11/20 Sacrum Medial Deep Tissue Pressure Injury - Purple or maroon localized area of discolored intact skin or blood-filled blister due to  damage of underlying soft tissue from pressure and/or shear. (Active)  10/11/20 0220  Location: Sacrum  Location Orientation: Medial  Staging: Deep Tissue Pressure Injury - Purple or maroon localized area of discolored intact skin or blood-filled blister due to damage of underlying soft tissue from pressure and/or shear.  Wound Description (Comments):   Present on Admission: Yes     Pressure Injury 10/15/20 Heel Right Deep Tissue Pressure Injury - Purple or maroon localized area of discolored intact skin or blood-filled blister due to damage of underlying soft tissue from pressure and/or shear. (Active)  10/15/20 0323  Location: Heel  Location Orientation: Right  Staging: Deep Tissue Pressure Injury - Purple or maroon localized area of discolored intact skin or blood-filled blister due to damage of underlying soft tissue from pressure and/or shear.  Wound Description (Comments):   Present on Admission: No     Pressure Injury 10/27/20 Knee Anterior;Right;Lateral Deep Tissue Pressure Injury - Purple or maroon localized area of discolored intact skin or blood-filled blister due to damage of underlying soft tissue from pressure and/or shear. (Active)  10/27/20 1941  Location: Knee  Location Orientation: Anterior;Right;Lateral  Staging: Deep Tissue Pressure Injury - Purple or maroon localized area of discolored intact skin or blood-filled blister due to damage of underlying soft tissue from pressure and/or shear.  Wound Description (Comments):   Present on Admission: Yes     Estimated body mass index is 21.13 kg/m as calculated from the following:   Height as of this encounter: _0  (1.753 m).   Weight as of this encounter: 64.9 kg.  Malnutrition Type:  Nutrition Problem: Severe Malnutrition Etiology: chronic illness (small cell carcinoma of the lung)  Malnutrition Characteristics:  Signs/Symptoms: severe muscle depletion,severe fat depletion  Nutrition  Interventions:  Interventions: Tube feeding,Prostat,Magic cup   Radiology Studies: DG HIP PORT UNILAT WITH PELVIS 1V LEFT  Result Date: 10/30/2020 CLINICAL DATA:  Fall.  Left hip pain. EXAM: DG HIP (WITH OR WITHOUT PELVIS) 1V PORT LEFT COMPARISON:  10/12/2020. FINDINGS: Left hip replacement. Hardware intact. Anatomic alignment. Diffuse osteopenia. Degenerative changes lumbar spine and right hip. No acute bony or joint abnormality. Barium noted throughout the colon. Pelvic calcifications consistent with phleboliths. IMPRESSION: 1.  Left hip replacement.  Hardware intact.  Anatomic alignment. 2. Diffuse osteopenia. Degenerative changes lumbar spine and right hip. No acute bony abnormality. Electronically Signed   By: [place]   On: 10/30/2020 11:19   Scheduled Meds: . arformoterol  15 mcg Nebulization BID  . aspirin  81 mg Oral Daily  . chlorhexidine gluconate (MEDLINE KIT)  15 mL Mouth Rinse BID  . Chlorhexidine Gluconate Cloth  6 each Topical Daily  . dexamethasone (DECADRON) injection  4 mg Intravenous Q6H  . diphenoxylate-atropine  5 mL Oral Daily  . doxazosin  2 mg Oral Daily  . famotidine  20 mg Oral BID  . feeding supplement (PROSource TF)  45 mL Per Tube Daily  . gabapentin  100 mg Oral Q8H  . heparin injection (subcutaneous)  5,000 Units Subcutaneous Q8H  . mouth rinse  15 mL Mouth Rinse q12n4p  . melatonin  5 mg Oral QHS  . metoprolol tartrate  12.5 mg Oral BID  . mirtazapine  30 mg Oral QHS  .  nicotine  14 mg Transdermal Daily  . revefenacin  175 mcg Nebulization Daily  . sodium chloride flush  10-40 mL Intracatheter Q12H   Continuous Infusions: . sodium chloride Stopped (10/25/20 0829)  . sodium chloride 50 mL/hr at 10/30/20 2243    LOS: 21 days   Kerney Elbe, DO [facility] PAGER is on [place]  If 7PM-7AM, please contact night-coverage [email or web address]

## 2020-10-31 NOTE — Progress Notes (Signed)
Speech Language Pathology Treatment: Dysphagia  Patient Details Name: [name] MRN: [id number] DOB: Nov 10, 1950 Today's Date: 10/31/2020 Time: 1205-1237 SLP Time Calculation (min) (ACUTE ONLY): 32 min  Assessment / Plan / Recommendation Clinical Impression  Pt seen today to assess po tolerance, educate him to findings of prior MBS *using video loops from his study* and reinforce effective compensation strategies.   Pt appears frail, cachetic but as polite and willing to participate.  He admits to displeasure in taste of thickened drinks - thus SlP thickened lemon lime soda for alternative.  Pt consumed nectar liquid via straw- immediate cough noted x2/5 swallows.    Unfortunately his cough is congested and weak and often he is unable to expectorate secretions. Using teach back and written instruction on board - advised pt to need to strengthen cough/"hock" for airway protection. Verbal cues to take SMALL boluses was helpful to prevent overt coughing with further trials.  Pt also consumed applesauce bolus x2, icecream x2 and cereal bar x2.  Prolonged "mastication", pt without dentures he uses to eat at home, followed by pt needing liquids to clear.  Next bolus of liquid was followed by strong cough and expectoration of viscous whitish secretions.  Pt advised he is "trying to do what you want me to" re: his swallowing.  He is making progress in understanding his dysphagia and attempting compensations.  His intake has been poor and suspect his dysphagia contributes to this.  Advised pt to SLP role of aspiration mitigation not prevention - to which he advised understanding.  Requested for pt to ask his brother to bring pt's dentures to allow him maximal potential to swallow. Note pt to follow up with palliative and oncology - will continue efforts to help maximize po as safely as possible.    HPI HPI: Pt is a 70 yo male admitted 3/30 with AMS after fall with unknown downtime (1-3 days suspected). Pt  was found to have L hip fx, L L3 transverse process fx, L frontal sinus fx, and AKI with rhabdomyolysis. On 3/31 pt had worsening hypoxia thought to be related to an aspiration event, requiring ETT 3/31-4/2; another suspected aspiration event on requiring re-intubation 4/4-4/6. 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 Pt was reporting intermittent difficulty swallowing solids in 2019 per cancer center notes. Palliative care has been consulted and as of 4/13, family would like full scope of care. MBS 4/11: oropharyngeal dysphagia characterized by reduced bolus cohesion, a pharyngeal delay, reduced lingual retraction, and reduced anterior laryngeal movement. He demonstrated premature spillage to the valleculae with inconsistent spillover to the pyriform sinuses, base of tongue residue, vallecular residue, pyriform sinus residue, penetration (PAS 3, 5) and inconsistent aspiration (PAS 7) of thin and nectar thick iquids. CXR 4/12: Mildly improved left lower lobe pneumonia. Mild changes of COPD.      SLP Plan  Continue with current plan of care       Recommendations  Diet recommendations: Dysphagia 2 (fine chop);Nectar-thick liquid Liquids provided via: Cup;Straw Medication Administration: Whole meds with puree Supervision: Staff to assist with self feeding;Full supervision/cueing for compensatory strategies Compensations: Slow rate;Small sips/bites;Minimize environmental distractions;Follow solids with liquid (cough and expectorate prn, stop po if short of breath or coughing) Postural Changes and/or Swallow Maneuvers: Seated upright 90 degrees                Oral Care Recommendations: Oral care BID Follow up Recommendations: Skilled  Nursing facility SLP Visit Diagnosis: Dysphagia, oropharyngeal phase (R13.12) Plan: Continue with current plan of care       GO                 Macario Golds 10/31/2020, 12:58 PM  Kathleen Lime, MS [facility] SLP Acute Rehab Services Office [phone] Pager [phone]

## 2020-11-01 ENCOUNTER — Ambulatory Visit
Admit: 2020-11-01 | Discharge: 2020-11-01 | Disposition: A | Discharge status: Home / Self Care | Payer: Medicare HMO | Attending: Radiation Oncology | Admitting: Radiation Oncology

## 2020-11-01 DIAGNOSIS — I10 Essential (primary) hypertension: Secondary | ICD-10-CM | POA: Diagnosis not present

## 2020-11-01 DIAGNOSIS — Z7189 Other specified counseling: Secondary | ICD-10-CM | POA: Diagnosis not present

## 2020-11-01 DIAGNOSIS — S72002A Fracture of unspecified part of neck of left femur, initial encounter for closed fracture: Secondary | ICD-10-CM | POA: Diagnosis not present

## 2020-11-01 DIAGNOSIS — Z515 Encounter for palliative care: Secondary | ICD-10-CM | POA: Diagnosis not present

## 2020-11-01 DIAGNOSIS — W19XXXA Unspecified fall, initial encounter: Secondary | ICD-10-CM | POA: Diagnosis not present

## 2020-11-01 DIAGNOSIS — L89156 Pressure-induced deep tissue damage of sacral region: Secondary | ICD-10-CM | POA: Diagnosis not present

## 2020-11-01 DIAGNOSIS — J9601 Acute respiratory failure with hypoxia: Secondary | ICD-10-CM | POA: Diagnosis not present

## 2020-11-01 DIAGNOSIS — C3490 Malignant neoplasm of unspecified part of unspecified bronchus or lung: Secondary | ICD-10-CM | POA: Diagnosis not present

## 2020-11-01 LAB — CBC WITH DIFFERENTIAL/PLATELET
Abs Immature Granulocytes: 0.13 10*3/uL — ABNORMAL HIGH (ref 0.00–0.07)
Basophils Absolute: 0 10*3/uL (ref 0.0–0.1)
Basophils Relative: 0 %
Eosinophils Absolute: 0 10*3/uL (ref 0.0–0.5)
Eosinophils Relative: 0 %
HCT: 29.2 % — ABNORMAL LOW (ref 39.0–52.0)
Hemoglobin: 9.3 g/dL — ABNORMAL LOW (ref 13.0–17.0)
Immature Granulocytes: 2 %
Lymphocytes Relative: 8 %
Lymphs Abs: 0.5 10*3/uL — ABNORMAL LOW (ref 0.7–4.0)
MCH: 31.4 pg (ref 26.0–34.0)
MCHC: 31.8 g/dL (ref 30.0–36.0)
MCV: 98.6 fL (ref 80.0–100.0)
Monocytes Absolute: 0.4 10*3/uL (ref 0.1–1.0)
Monocytes Relative: 5 %
Neutro Abs: 5.8 10*3/uL (ref 1.7–7.7)
Neutrophils Relative %: 85 %
Platelets: 304 10*3/uL (ref 150–400)
RBC: 2.96 MIL/uL — ABNORMAL LOW (ref 4.22–5.81)
RDW: 20.2 % — ABNORMAL HIGH (ref 11.5–15.5)
WBC: 6.8 10*3/uL (ref 4.0–10.5)
nRBC: 0 % (ref 0.0–0.2)

## 2020-11-01 LAB — COMPREHENSIVE METABOLIC PANEL WITH GFR
ALT: 22 U/L (ref 0–44)
AST: 12 U/L — ABNORMAL LOW (ref 15–41)
Albumin: 3 g/dL — ABNORMAL LOW (ref 3.5–5.0)
Alkaline Phosphatase: 65 U/L (ref 38–126)
Anion gap: 8 (ref 5–15)
BUN: 29 mg/dL — ABNORMAL HIGH (ref 8–23)
CO2: 22 mmol/L (ref 22–32)
Calcium: 9.2 mg/dL (ref 8.9–10.3)
Chloride: 106 mmol/L (ref 98–111)
Creatinine, Ser: 0.74 mg/dL (ref 0.61–1.24)
GFR, Estimated: >60 mL/min
Glucose, Bld: 123 mg/dL — ABNORMAL HIGH (ref 70–99)
Potassium: 4.4 mmol/L (ref 3.5–5.1)
Sodium: 136 mmol/L (ref 135–145)
Total Bilirubin: 0.5 mg/dL (ref 0.3–1.2)
Total Protein: 5.6 g/dL — ABNORMAL LOW (ref 6.5–8.1)

## 2020-11-01 LAB — MAGNESIUM: Magnesium: 2.1 mg/dL (ref 1.7–2.4)

## 2020-11-01 LAB — PHOSPHORUS: Phosphorus: 4.2 mg/dL (ref 2.5–4.6)

## 2020-11-01 MED ORDER — ENSURE ENLIVE PO LIQD
237.0000 mL | Freq: Two times a day (BID) | ORAL | Status: DC
  Administered 2020-11-01 – 2020-11-10 (×18): 237 mL via ORAL

## 2020-11-01 MED ORDER — BOOST / RESOURCE BREEZE PO LIQD CUSTOM
1.0000 | ORAL | Status: DC
  Administered 2020-11-03 – 2020-11-07 (×4): 1 via ORAL

## 2020-11-01 NOTE — Progress Notes (Signed)
Nutrition Follow-up  DOCUMENTATION CODES:   Severe malnutrition in context of chronic illness  INTERVENTION:  - continue Magic cup TID with meals, each supplement provides 290 kcal and 9 grams of protein - will order Boost Breeze once/day, each supplement provides 250 kcal and 9 grams of protein. - will order Ensure Enlive BID, each supplement provides 350 kcal and 20 grams of protein.  - if within [place], recommend replacement of small bore NGT and resume Jevity 1.5 @ 60 ml/hr x14 hours/day (1800-0800) with 45 ml Prosource TF once/day and 150 ml free water every 4 hours.   NUTRITION DIAGNOSIS:   Severe Malnutrition related to chronic illness (small cell carcinoma of the lung) as evidenced by severe muscle depletion,severe fat depletion. -ongoing  GOAL:   Patient will meet greater than or equal to 90% of their needs - unmet, likely will not meet orally  MONITOR:   PO intake,Supplement acceptance,Labs,Weight trends  ASSESSMENT:   70 year old male who presented to the ED on 3/30 with AMS after a fall. Pt with unknown time down (1-3 days suspected). 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 Pt found to have left hip fracture, left L3 transverse process fracture, left frontal sinus fracture as well as rhabdomyolysis.  Significant Events: 3/31- admission; RR; intubation 4/1- initial RD assessment; L total hip hemiarthroplasty; wound vac placed to L hip extubated 4/4- RR; re-intubation; TF initiation 4/6- Cortrak placed; extubated 4/8- patient pulled wound vac off 4/11 - s/p MBSS, dysphagia 2 diet with honey-thick liquids  4/14 - diet advanced to dysphagia 2 with nectar-thick liquids 4/15- 48 hour Calorie Count ordered; TF regimen changed from 24 hours/day to 12 hours/day (1800-0600) 4/16- transferred [facility] 4/19- Cortrak removed   The only meal completions  documented since RD visit on 4/18 were 0% of lunch on 4/18 and 50% of dinner last night (193 kcal and 12 grams protein).   Cortrak was removed on 4/19. Given poor PO intakes, which RD anticipates will continue, messaged MD in secure chat about concerns and recommendation for small bore NGT placement if within [place].   Weight has been fairly stable over the past 2 weeks; some slight fluctuation most days. No edema present at this time.  Palliative Care is following. Patient is now DNR.    Labs reviewed; BUN: 29 mg/dl. Medications reviewed; 20 mg oral pepcid BID, sliding scale novolog, 5 mg melatonin/night. IVF; NS @ 50 ml/hr.   Diet Order:   Diet Order            DIET DYS 2 Room service appropriate? Yes with Assist; Fluid consistency: Nectar Thick  Diet effective now                 EDUCATION NEEDS:   Education needs have been addressed  Skin:  Skin Assessment: Skin Integrity Issues: Skin Integrity Issues:: DTI,Incisions DTI: sacrum; R heel; R knee (newly documented 4/16) Stage I: - Incisions: L hip (4/1)  Last BM:  4/15  Height:   Ht Readings from Last 1 Encounters:  10/11/20 5\' 9"  (1.753 m)    Weight:   Wt Readings from Last 1 Encounters:  11/01/20 64.7 kg    Estimated Nutritional Needs:  Kcal:  1800-2000 Protein:  85-100 grams Fluid:  >/= 1.8 L     Jarome Matin, MS, RD, LDN, CNSC Inpatient Clinical Dietitian RD pager # available in [facility]  After hours/weekend  pager # available in [facility]

## 2020-11-01 NOTE — TOC Progression Note (Signed)
Transition of Care [facility]) - Progression Note    Patient Details  Name: STEADMAN PROSPERI MRN: [id number] Date of Birth: [birth year]  [facility] CM/SW Contact  Purcell Mouton, RN Phone Number: 11/01/2020, 1:18 PM  Clinical Narrative:    Spoke with pt concerning SNF offers. Pt asked CM to call his brother Levada Dy because he has control over him. Levada Dy was called several times with no answer. VM was left.    Expected Discharge Plan: [place] Barriers to Discharge: Continued Medical Work up (Cortrak and tube feedings in place.)  Expected Discharge Plan and Services Expected Discharge Plan: [place] In-house Referral: Clinical Social Work Discharge Planning Services: CM Consult Post Acute Care Choice: [place] arrangements for the past 2 months: Single Family Home                                       Social Determinants of Health (SDOH) Interventions    Readmission Risk Interventions No flowsheet data found.

## 2020-11-01 NOTE — Progress Notes (Signed)
Daily Progress Note   Patient Name: [name]       Date: 11/01/2020 DOB: 1951-07-01  Age: 70 y.o. MRN#: [id number] Attending Physician: Kerney Elbe, DO Primary Care Physician: [name], MD Admit Date: 10/10/2020  Reason for Consultation/Follow-up: Establishing goals of care  Subjective: I saw and examined [name].  We further discussed goals moving forward.  See below.  Length of Stay: 22  Current Medications: Scheduled Meds:  . arformoterol  15 mcg Nebulization BID  . aspirin  81 mg Oral Daily  . chlorhexidine gluconate (MEDLINE KIT)  15 mL Mouth Rinse BID  . Chlorhexidine Gluconate Cloth  6 each Topical Daily  . dexamethasone (DECADRON) injection  4 mg Intravenous Q6H  . diphenoxylate-atropine  5 mL Oral Daily  . doxazosin  2 mg Oral Daily  . famotidine  20 mg Oral BID  . feeding supplement  1 Container Oral Q24H  . feeding supplement  237 mL Oral BID BM  . gabapentin  100 mg Oral Q8H  . heparin injection (subcutaneous)  5,000 Units Subcutaneous Q8H  . mouth rinse  15 mL Mouth Rinse q12n4p  . melatonin  5 mg Oral QHS  . metoprolol tartrate  12.5 mg Oral BID  . mirtazapine  30 mg Oral QHS  . nicotine  14 mg Transdermal Daily  . revefenacin  175 mcg Nebulization Daily  . sodium chloride flush  10-40 mL Intracatheter Q12H    Continuous Infusions: . sodium chloride Stopped (10/25/20 0829)  . sodium chloride 50 mL/hr at 11/01/20 1226    PRN Meds: sodium chloride, acetaminophen **OR** acetaminophen, albuterol, ALPRAZolam, hydrALAZINE, insulin aspart, ondansetron (ZOFRAN) IV, polyethylene glycol, Resource ThickenUp Clear, sodium chloride flush  Physical Exam         Awake alert Is thin and has muscle wasting Regular work of breathing S 1 S 2   Abdomen not distended No edema  Vital Signs: BP 137/66   Pulse 62   Temp 98 F (36.7 C) (Oral)   Resp 20   Ht 5' 9"  (1.753 m)   Wt 64.7 kg   SpO2 95%   BMI 21.06 kg/m  SpO2: SpO2: 95 % O2 Device: O2 Device: Room Air O2 Flow Rate: O2 Flow Rate (L/min): 2 L/min  Intake/output summary:   Intake/Output Summary (Last 24  hours) at 11/01/2020 2301 Last data filed at 11/01/2020 1500 Gross per 24 hour  Intake --  Output 1625 ml  Net -1625 ml   LBM: Last BM Date: 10/26/20 Baseline Weight: Weight: 66.7 kg Most recent weight: Weight: 64.7 kg       Palliative Assessment/Data: PPS: 50%    Flowsheet Rows   Flowsheet Row Most Recent Value  Intake Tab   Referral Department Hospitalist  Unit at Time of Referral Intermediate Care Unit  Date Notified 10/20/20  Palliative Care Type New Palliative care  Reason for referral Clarify Goals of Care  Date of Admission 10/10/20  Date first seen by Palliative Care 10/21/20  # of days Palliative referral response time 1 Day(s)  # of days IP prior to Palliative referral 10  Clinical Assessment   Psychosocial & Spiritual Assessment   Palliative Care Outcomes       Patient Active Problem List   Diagnosis Date Noted  . Primary malignant neoplasm of lung with metastasis to brain ([place]) 10/27/2020  . Essential hypertension 10/27/2020  . Thrombocytopenia ([place]) 10/27/2020  . Tobacco abuse 10/27/2020  . Sacral pressure ulcer 10/25/2020  . Orthostatic hypotension 10/25/2020  . Oropharyngeal dysphagia   . Status post total replacement of left hip   . Non-traumatic rhabdomyolysis   . Fall   . Left-sided weakness   . Acute respiratory failure with hypoxia ([place])   . Protein-calorie malnutrition, severe 10/16/2020  . Closed fracture of left hip ([place])   . Pressure injury of skin 10/11/2020  . Sepsis without septic shock ([place])   . Left displaced femoral neck fracture ([place])   . Delirium   . Closed fracture of frontal sinus ([place])   . Closed  fracture of transverse process of lumbar vertebra ([place])   . Goals of care, counseling/discussion 12/19/2019  . Iron deficiency anemia 09/12/2019  . Small cell lung cancer in adult [facility]) 02/05/2017  . Lymphadenopathy of head and neck 01/22/2017  . Osteopenia 03/30/2015  . Clubbing of fingers 04/14/2014  . Hyperglycemia 10/20/2013  . Smoker   . Closed dislocation of acromioclavicular joint 04/11/2010  . BREAST MASS, LEFT 01/20/2008  . ONYCHOMYCOSIS, TOENAILS 10/20/2007  . MUSCLE CRAMPS 10/20/2007  . Hypercholesterolemia 05/28/2007  . CATARACT NOS 08/28/2006  . DISTURBANCE, VISUAL NOS 08/19/2006  . COPD (chronic obstructive pulmonary disease) with emphysema ([place]) 08/19/2006  . WITHDRAWAL, DRUG 06/24/2006  . ANXIETY 06/24/2006  . DEPRESSION 06/24/2006  . MYOCARDIAL INFARCTION, HX OF 06/24/2006  . Coronary atherosclerosis 06/24/2006  . CARDIAC ARRHYTHMIA 06/24/2006  . CONSTIPATION 06/24/2006  . OSTEOARTHRITIS 06/24/2006  . LOW BACK PAIN 06/24/2006  . 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 He remains confused. Has significant dysphagia and is currently NPO with coretrak in place. Palliative medicine consulted for goals of care for this very ill man with multiple comorbidities who is at high risk of decompensation and dying.  Assessment/Recommendations/Plan -Long discussion today about goals and options for care moving forward.  We talked about realistic pathways forward.  One pathway we discussed is a plan to continue pursuing aggressive interventions  (finish radiation, go to SNF, follow-up with  Dr. Delton Coombes to discussed potential for more disease modifying therapy) but with understanding that intent would not be to "cure" his cancer.  In this case, I told him that we may want to consider PEG as best option for longer-term nutrition and support.  We discussed that this would not reduce his risk of aspiration.  We also discussed another option to forego further cancer treatment and focus on comfort.  If this was his choice, I would not recommend pursuing NG tube or PEG.  He reports that he wants to think about it and talk with his family and we can discuss his decision tomorrow.  He is still hoping to talk with Dr. Delton Coombes.  Goals of Care and Additional Recommendations: Limitations on Scope of Treatment: Full Scope Treatment  Code Status: Full code  Prognosis:  guarded.   Discharge Planning: To Be Determined  Care plan was discussed with patient.   Thank you for allowing the Palliative Medicine Team to assist in the care of this patient.   Total time: 50 minutes Greater than 50%  of this time was spent counseling and coordinating care related to the above assessment and plan.  Micheline Rough, MD [place] Team [phone]  Please contact Palliative Medicine Team phone at [phone] for questions and concerns.

## 2020-11-01 NOTE — Plan of Care (Signed)
  Problem: Coping: Goal: Level of anxiety will decrease Outcome: Not Progressing   

## 2020-11-01 NOTE — Progress Notes (Addendum)
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 Admitted to [facility] on 3/30 for encephalopathy following a fall with associated facial fractures. Patient was found by brother on 3/30 after he had been unable to reach patient for unknown period of time (1 to 3 days suspected). Patient fracture left frontal sinus, L3 transverse process, displaced angulated mildly comminuted left femoral neck fracture. AKI with rhabdomyolysis.   3/31 patient develop worsening hypoxia and respiratory distress, intubated.  4/2 extubated and transferred out of the ICU on 4/3. T 4/3--4/4 he developed increased work of breathing and increased oxygen needs requiring intubation. This was thought to be related to an aspiration event.  4/6 -  extubated 4/12 - questionable aspiration pneumonia, Unasyn initiated now completed Interim - worsening mental status, MRI of the brain on 4/16 shows newly diagnosed metastatic disease, transferred to [facility] for radiation oncology evaluation and steroids 4/18 - mental status improving, patient AOx4, somewhat sluggish but appropriate.  We had a lengthy discussion about goals of care, patient has not yet decided on goals of care, hospice, CODE STATUS as he is still dealing with trying to understand his new diagnosis and states he has not had a meaningful conversation with oncology about his prognosis and life expectancy.  **Interim History  On 10/30/20 the patient attempted to get out of bed without calling for staff to help and fell on the floor. Repeat Imaging done and unremarkable.  PT OT still recommending skilled nursing facility.  Patient mentation looks like it is improving.  Palliative care to have further goals of care discussion and patient's primary oncologist to call the patient and  discuss further options.  Patient wishes to be DNR now so his CODE STATUS was changed.  Palliative met with the patient and discussed his options about his feeding given his poor p.o. intake and patient wishes to hold off his NG tube today.  Patient to discuss with family as well as oncology about what he wants and if he continues to pursue aggressive care he will finish radiation, go to SNF and end up with a PEG tube placement however if he chooses a more palliative approach and foregoing further cancer treatment we would not place an NG tube or PEG.  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 [facility])   Left displaced femoral neck fracture (HCC)   Closed fracture of transverse process of lumbar vertebra (HCC)   Pressure injury of skin   Closed fracture of left hip (HCC)   Protein-calorie malnutrition, severe   Status post total replacement of left hip   Non-traumatic rhabdomyolysis   Fall   Left-sided weakness   Acute respiratory failure with hypoxia (HCC)   Oropharyngeal dysphagia   Sacral pressure ulcer   Orthostatic hypotension   Primary malignant neoplasm of lung with metastasis to brain [facility])   Essential hypertension   Thrombocytopenia (HCC)   Tobacco abuse  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 Delton Coombes with oncology and Dr. Sondra Come with rads oncology  -Currently now at [facility] for ongoing palliative radiation -Dr. Ashok Pall neurosurgery does not believe patient currently needs shunt however states if radiation oncology requires shunt placement he is available for placement -Neurology recommends  continuing radiation brain treatments and having the patient be discharged to the skilled nursing facility -C/w Supportive care  -Currently getting Radiation TX and will receive a total of 10 Tx total; will complete radiation therapy if he continues to pursue aggressive care and if he does choose to do so we will need  a PEG tube placement given his poor p.o. intake -Patient to decide about PEG placement but wants to hold off on replacing NG tube today.  Acute respiratory failure with hypoxia/Aspiration pneumonia/mucus plugging/likely history of emphysema requiring intubation/PPV, Resolving - 4/5 Mucus Plug likely the cause of acute hypoxia - Patient previously requiring Ventimask when sleeping given mouth breathing with NG tube in - follow clinically now that NG tube has been removed and his p.o. intake is very poor  Hx Small cell lung cancer  -S/p chemotherapy Lurbinectedin last cycle 09/12/20. -Extubated 4\6 -Completed 7 days of cefepime 10/16/20. - Brovana/Yupelri nebulizer treatments and PRN albuterol.  -received Solumedrol on 4/05 during mucus plug event, dexamethasone ongoing given his brain metastasis -Completed 5-day course Unasyn 4/17  Orthostatic Hypotension - Albumin given previously with minimal improvement -C/w TED hose 12 hours on/12 hours off - NS 50 ml/hr; Tube feeds w/ free water flushes were initiated but now stopped;  Patient to decide whether he wants to have his NG tube replaced or if you opt for PEG depends on how aggressive he wants to be with his treatment -Further goals of care discussions being had  Essential HTN/Ventricular tachycardia - The night before last reported patient had a couple runs of ventricular tachycardia in addition rise in BP.  -Most likely secondary to his multiple brain mets with hydrocephalus - Allow permissive HTN - Metoprolol 12.5 mg BID - Hydralazine PRN SBP> 170 or DBP> 110 -Last blood pressure was 143/63  Nausea/vomiting - 4/14 resolved   Diarrhea;  - Resolved after stopping laxative -tube feeds discontinued for now but may need to be reinitiated; See below   Acute metabolic encephalopathy, resolving and improving Cannot rule out acute hospital delirium.  - Likely in the setting of metastatic disease, acute respiratory failure,  aspiration pneumonia and ICU delirium. - Continue Gabapentin,dose was reduced to 100 mg 3 times daily from 300 mg. -Remains somewhat slow to answer but more appropriate, ANO x4 for the past 72 hours -Palliative Care consulted for [place] Discussion -Patient getting Radiation Tx and currently getting 10 scheduled radiation treatments; I spoke with case management about discharging to SNF and patient will need to have his nutrition addressed prior to this and if he wants to continue aggressive care will need a NG tube placement versus a PEG  Left femoral Neck Fx S/P Hemiarthroplasty:  -PT following -Mechanical fall 10/30/2020, plain film unremarkable, orthopedic sidelined, no further imaging or intervention required -Continue to Monitor and C/w Pain Control   Left Facial Fx, L 3 TP Fx:  -Neurosurgery evaluated , no surgical intervention needed.  -Neurosurgery recommend ENT consult. -Spoke with ENT on called for [place] recommend Conservative management, Fracture is non displaced, does not need anything acute.   Rhabdomyolysis, POA -Resolved with IV fluids.   Thrombocytopenia, POA -Thrombocytopenia has now resolved and last Platelet Count was 3 4 -Continue to Monitor for S/Sx of Bleeding; No overt bleeding noted -Repeat CBC in the AM   Normocytic Anemia, likely chronic anemia of chronic disease - 4/6 transfused 1 unit PRBC -Patient's Hgb/Hct went from 8.7/27.4 -> 9.0/27.8 and is improved to 9.3/9.2 -Continue to Monitor for S/Sx of Bleeding -No overt  bleeding noted  GERD -C/w Famotidine 20 mg po BID   Tobacco Abuse -Smoking Cessasstion counseling given  -C/w Nicotine Patch 14 mg TD Daily   Sacral pressure ulcer, POA Pressure Injury 10/11/20 Sacrum Medial Deep Tissue Pressure Injury - Purple or maroon localized area of discolored intact skin or blood-filled blister due to damage of underlying soft tissue from pressure and/or shear. (Active)  10/11/20 0220  Location: Sacrum  Location  Orientation: Medial  Staging: Deep Tissue Pressure Injury - Purple or maroon localized area of discolored intact skin or blood-filled blister due to damage of underlying soft tissue from pressure and/or shear.  Wound Description (Comments):   Present on Admission: Yes     Pressure Injury 10/15/20 Heel Right Deep Tissue Pressure Injury - Purple or maroon localized area of discolored intact skin or blood-filled blister due to damage of underlying soft tissue from pressure and/or shear. (Active)  10/15/20 0323  Location: Heel  Location Orientation: Right  Staging: Deep Tissue Pressure Injury - Purple or maroon localized area of discolored intact skin or blood-filled blister due to damage of underlying soft tissue from pressure and/or shear.  Wound Description (Comments):   Present on Admission: No     Pressure Injury 10/27/20 Knee Anterior;Right;Lateral Deep Tissue Pressure Injury - Purple or maroon localized area of discolored intact skin or blood-filled blister due to damage of underlying soft tissue from pressure and/or shear. (Active)  10/27/20 1941  Location: Knee  Location Orientation: Anterior;Right;Lateral  Staging: Deep Tissue Pressure Injury - Purple or maroon localized area of discolored intact skin or blood-filled blister due to damage of underlying soft tissue from pressure and/or shear.  Wound Description (Comments):   Present on Admission: Yes   Severe protein calorie malnutrition -Dietitian consulted and evaluated ad recommending Changing TF to Jevity 1.5 @ 60 mL/hr x 14 hours a day with Prosource TF once/day and 150 mL Free Water q4h if NGT/PEG is placed -NG tube/core track incidentally removed yesterday morning after fall, will hold off on replacement and advance diet as tolerated. - Speech placed him on dysphagia 2 diet with thick liquid after getting a barium swallow, please see the full report-remains high risk for aspiration. - Will not offer PEG tube placement given  patient's advanced disease and grim prognosis however his Oncologist feels he has been functional and thinks he can offer more treatment options. IF patient continues to want Aggressive Measure and care will need PEG Placement for Nutritional Support to  D/C to SNF -C/w Mirtazapine 30 mg po qHS for Appetite   GOALS OF CARE -Discussed with palliative care for advancement of hospice and end-of-life care discussion in the next 24 - 48h however his primary oncologist will call the patient this afternoon; brother Levada Dy is very understanding of the patient's current situation and prognosis.  Dr. Avon Gully had a lengthy talk at bedside with the patient about his poor prognosis, ongoing palliative treatment and goals.  Patient now aware advanced stage of metastatic disease with grim prognosis, he is unclear whether or not he wishes to continue with this modality of treatment if it is not curative, we also discussed possible need for discharge to location other than home given he lives alone at home and will likely not be able to care for himself in the interim or as his disease progresses. -We did discuss discharge to his brother's house versus hospice house, he does not wish to live with his brother, palliative care to continue this conversation for possible hospice house  admission once patient has completed inpatient therapies however his primary oncologist is advocating for SNF -CODE Status changed to DNR per patient's wishes after [place] discussion this AM .  DVT prophylaxis: SCDS; Heparin 5,000 units sq q8h Code Status: DO NOT RESUSCITATE  Family Communication: No family present at bedside  Disposition Plan: SNF if improved but continues to have Palliative Care Discussions  Status is: Inpatient  Remains inpatient appropriate because:Unsafe d/c plan, IV treatments appropriate due to intensity of illness or inability to take PO and Inpatient level of care appropriate due to severity of  illness   Dispo:  Patient From:   Home  Planned Disposition: To be determined but likely SNF vs. Hospice depending on further palliative discussions  Medically stable for discharge:   NO    Consultants:   ENT  Neurosurgery  Palliative Care Medicine  Radiation Oncology  Medical Oncology consulted   Procedures/Significant Events:  3/30 - admitted to [facility] for encephalopathy. Facial fx, L3 TP fx, Hip fx.  3/31 non op from [place] standpoint. Ortho planned to OR 3/31 however acute hypoxic respiratory failure, transferred to ICU and emergently intubated.  3/31 ETT >>4/1 3/31 bronchoscopy with BAL left lower lobe 4/1 LT Hip Hemiarthroplasty (Anterior Approach) 4/4 ETT>>4/6 4/12Unasyn for concern of aspiration -now completed 4/16 -MRI brain showing metastatic disease  Cultures 4/4 tracheal aspirate positive rare Candida Krusei  4/4 MRSA by PCR negative  Antimicrobials:  Anti-infectives (From admission, onward)   Start     Dose/Rate Route Frequency Ordered Stop   10/23/20 1330  Ampicillin-Sulbactam (UNASYN) 3 g in sodium chloride 0.9 % 100 mL IVPB        3 g 200 mL/hr over 30 Minutes Intravenous Every 6 hours 10/23/20 1241 10/28/20 2359   10/15/20 2200  ceFEPIme (MAXIPIME) 2 g in sodium chloride 0.9 % 100 mL IVPB        2 g 200 mL/hr over 30 Minutes Intravenous Every 12 hours 10/15/20 1121 10/16/20 2332   10/15/20 0400  vancomycin (VANCOREADY) IVPB 1500 mg/300 mL  Status:  Discontinued        1,500 mg 150 mL/hr over 120 Minutes Intravenous Every 24 hours 10/15/20 0300 10/15/20 1324   10/12/20 1254  vancomycin (VANCOCIN) powder  Status:  Discontinued          As needed 10/12/20 1255 10/12/20 1328   10/11/20 2100  vancomycin (VANCOREADY) IVPB 1250 mg/250 mL  Status:  Discontinued        1,250 mg 166.7 mL/hr over 90 Minutes Intravenous Every 24 hours 10/10/20 2104 10/12/20 0834   10/11/20 1800  ceFEPIme (MAXIPIME) 2 g in sodium chloride 0.9 % 100 mL IVPB  Status:  Discontinued         2 g 200 mL/hr over 30 Minutes Intravenous Every 8 hours 10/11/20 1230 10/15/20 1121   10/11/20 1200  ceFAZolin (ANCEF) IVPB 2g/100 mL premix        2 g 200 mL/hr over 30 Minutes Intravenous On call to O.R. 10/11/20 1114 10/12/20 0559   10/10/20 2100  vancomycin (VANCOREADY) IVPB 1250 mg/250 mL        1,250 mg 166.7 mL/hr over 90 Minutes Intravenous  Once 10/10/20 2058 10/10/20 2327   10/10/20 2100  ceFEPIme (MAXIPIME) 2 g in sodium chloride 0.9 % 100 mL IVPB  Status:  Discontinued        2 g 200 mL/hr over 30 Minutes Intravenous Every 12 hours 10/10/20 2058 10/11/20 1230  Subjective: Seen and examined and he continues to have some soreness where he fell.  No nausea or vomiting.  He has had dental care but his appetite has not been the best.  Discussed with me about wanting to be a DNR was changed. No lightheadedness or dizziness. Still wants to speak with his primary Oncologist.   Objective: Vitals:   10/31/20 2052 11/01/20 0500 11/01/20 0644 11/01/20 0845  BP: 131/67  (!) 143/63   Pulse: 65  (!) 59   Resp: 14  18   Temp: 97.6 F (36.4 C)  97.8 F (36.6 C)   TempSrc: Oral  Oral   SpO2: 97%  96% 93%  Weight:  64.7 kg    Height:        Intake/Output Summary (Last 24 hours) at 11/01/2020 1427 Last data filed at 11/01/2020 0500 Gross per 24 hour  Intake 240 ml  Output 1500 ml  Net -1260 ml   Filed Weights   10/30/20 0425 10/31/20 0500 11/01/20 0500  Weight: 63.7 kg 64.9 kg 64.7 kg   Examination: Physical Exam:  Constitutional: Thin chronically ill-appearing Caucasian male in NAD appears calm Eyes: Lids and conjunctivae normal, sclerae anicteric  ENMT: External Ears, Nose appear normal. Grossly normal hearing.  Neck: Appears normal, supple, no cervical masses, normal ROM, no appreciable thyromegaly; no JVD Respiratory: Diminished to auscultation bilaterally, no wheezing, rales, rhonchi or crackles. Normal respiratory effort and patient is not tachypenic. No  accessory muscle use. Unlabored breathing  Cardiovascular: RRR, no murmurs / rubs / gallops. S1 and S2 auscultated.  Abdomen: Soft, non-tender, non-distended. Bowel sounds positive.  GU: Deferred. Musculoskeletal: No clubbing / cyanosis of digits/nails. No joint deformity upper and lower extremities. Hip Incision appears C/D/I Skin: Has Scaral Ulcers, ulcers. No induration; Warm and dry.  Neurologic: CN 2-12 grossly intact with no focal deficits. Romberg sign and cerebellar reflexes not assessed.  Psychiatric: Normal judgment and insight. Alert and oriented x 3. Normal mood and appropriate affect.   Data Reviewed: I have personally reviewed following labs and imaging studies  CBC: Recent Labs  Lab 10/26/20 0404 10/27/20 0122 10/28/20 0854 10/29/20 0341 10/31/20 0345 11/01/20 0407  WBC 6.9 6.1 6.0 8.1 8.1 6.8  NEUTROABS 5.1 4.5 5.4  --   --  5.8  HGB 8.8* 9.0* 8.9* 8.7* 9.0* 9.3*  HCT 27.2* 27.8* 28.4* 27.4* 27.8* 29.2*  MCV 95.4 95.5 97.3 97.2 97.9 98.6  PLT 317 336 328 319 297 578   Basic Metabolic Panel: Recent Labs  Lab 10/26/20 0404 10/27/20 0122 10/28/20 0854 10/29/20 0341 10/30/20 0409 10/31/20 0345 10/31/20 0807 11/01/20 0407  NA 137 138  --  139  --   --  136 136  K 4.2 4.5  --  4.5  --   --  4.2 4.4  CL 107 106  --  108  --   --  106 106  CO2 25 25  --  25  --   --  23 22  GLUCOSE 132* 124*  --  163*  --   --  116* 123*  BUN 18 16  --  27*  --   --  31* 29*  CREATININE 0.74 0.81  --  0.83  --   --  0.72 0.74  CALCIUM 8.6* 8.9  --  9.2  --   --  9.1 9.2  MG 2.2 2.2   < > 2.2 2.1 2.1 2.1 2.1  PHOS 4.0 3.9   < > 3.4 3.1 4.8*  4.1 4.2   < > = values in this interval not displayed.   GFR: Estimated Creatinine Clearance: 79.8 mL/min (by C-G formula based on SCr of 0.74 mg/dL). Liver Function Tests: Recent Labs  Lab 10/26/20 0404 10/27/20 0122 10/29/20 0341 10/31/20 0807 11/01/20 0407  AST _0 12*  ALT _1 ALKPHOS 63 63 61 64 65   BILITOT 0.3 0.3 0.5 0.4 0.5  PROT 5.7* 5.8* 6.2* 5.9* 5.6*  ALBUMIN 2.9* 2.9* 3.3* 3.1* 3.0*   No results for input(s): LIPASE, AMYLASE in the last 168 hours. No results for input(s): AMMONIA in the last 168 hours. Coagulation Profile: No results for input(s): INR, PROTIME in the last 168 hours. Cardiac Enzymes: No results for input(s): CKTOTAL, CKMB, CKMBINDEX, TROPONINI in the last 168 hours. BNP (last 3 results) No results for input(s): PROBNP in the last 8760 hours. HbA1C: No results for input(s): HGBA1C in the last 72 hours. CBG: Recent Labs  Lab 10/29/20 1957 10/29/20 2351 10/30/20 0421 10/30/20 0746 10/30/20 1156  GLUCAP 158* 147* 172* 127* 116*   Lipid Profile: No results for input(s): CHOL, HDL, LDLCALC, TRIG, CHOLHDL, LDLDIRECT in the last 72 hours. Thyroid Function Tests: No results for input(s): TSH, T4TOTAL, FREET4, T3FREE, THYROIDAB in the last 72 hours. Anemia Panel: No results for input(s): VITAMINB12, FOLATE, FERRITIN, TIBC, IRON, RETICCTPCT in the last 72 hours. Sepsis Labs: Recent Labs  Lab 10/25/20 1704 10/25/20 2229 10/26/20 0404 10/27/20 0122  PROCALCITON <0.10  --  <0.10 <0.10  LATICACIDVEN 1.0 1.0  --   --     No results found for this or any previous visit (from the past 240 hour(s)).   RN Pressure Injury Documentation: Pressure Injury 10/11/20 Sacrum Medial Deep Tissue Pressure Injury - Purple or maroon localized area of discolored intact skin or blood-filled blister due to damage of underlying soft tissue from pressure and/or shear. (Active)  10/11/20 0220  Location: Sacrum  Location Orientation: Medial  Staging: Deep Tissue Pressure Injury - Purple or maroon localized area of discolored intact skin or blood-filled blister due to damage of underlying soft tissue from pressure and/or shear.  Wound Description (Comments):   Present on Admission: Yes     Pressure Injury 10/15/20 Heel Right Deep Tissue Pressure Injury - Purple or maroon  localized area of discolored intact skin or blood-filled blister due to damage of underlying soft tissue from pressure and/or shear. (Active)  10/15/20 0323  Location: Heel  Location Orientation: Right  Staging: Deep Tissue Pressure Injury - Purple or maroon localized area of discolored intact skin or blood-filled blister due to damage of underlying soft tissue from pressure and/or shear.  Wound Description (Comments):   Present on Admission: No     Pressure Injury 10/27/20 Knee Anterior;Right;Lateral Deep Tissue Pressure Injury - Purple or maroon localized area of discolored intact skin or blood-filled blister due to damage of underlying soft tissue from pressure and/or shear. (Active)  10/27/20 1941  Location: Knee  Location Orientation: Anterior;Right;Lateral  Staging: Deep Tissue Pressure Injury - Purple or maroon localized area of discolored intact skin or blood-filled blister due to damage of underlying soft tissue from pressure and/or shear.  Wound Description (Comments):   Present on Admission: Yes     Estimated body mass index is 21.06 kg/m as calculated from the following:   Height as of this encounter: _2  (1.753 m).   Weight as of this encounter: 64.7 kg.  Malnutrition Type:  Nutrition Problem: Severe  Malnutrition Etiology: chronic illness (small cell carcinoma of the lung)  Malnutrition Characteristics:  Signs/Symptoms: severe muscle depletion,severe fat depletion  Nutrition Interventions:  Interventions: Magic cup,Ensure Enlive (each supplement provides 350kcal and 20 grams of protein),Boost Breeze,MVI   Radiology Studies: No results found. Scheduled Meds: . arformoterol  15 mcg Nebulization BID  . aspirin  81 mg Oral Daily  . chlorhexidine gluconate (MEDLINE KIT)  15 mL Mouth Rinse BID  . Chlorhexidine Gluconate Cloth  6 each Topical Daily  . dexamethasone (DECADRON) injection  4 mg Intravenous Q6H  . diphenoxylate-atropine  5 mL Oral Daily  . doxazosin  2  mg Oral Daily  . famotidine  20 mg Oral BID  . feeding supplement  1 Container Oral Q24H  . feeding supplement  237 mL Oral BID BM  . gabapentin  100 mg Oral Q8H  . heparin injection (subcutaneous)  5,000 Units Subcutaneous Q8H  . mouth rinse  15 mL Mouth Rinse q12n4p  . melatonin  5 mg Oral QHS  . metoprolol tartrate  12.5 mg Oral BID  . mirtazapine  30 mg Oral QHS  . nicotine  14 mg Transdermal Daily  . revefenacin  175 mcg Nebulization Daily  . sodium chloride flush  10-40 mL Intracatheter Q12H   Continuous Infusions: . sodium chloride Stopped (10/25/20 0829)  . sodium chloride 50 mL/hr at 11/01/20 1226    LOS: 22 days   Kerney Elbe, DO [facility] PAGER is on [place]  If 7PM-7AM, please contact night-coverage [email or web address]

## 2020-11-02 ENCOUNTER — Ambulatory Visit
Admit: 2020-11-02 | Discharge: 2020-11-02 | Disposition: A | Discharge status: Home / Self Care | Payer: Medicare HMO | Attending: Radiation Oncology | Admitting: Radiation Oncology

## 2020-11-02 ENCOUNTER — Inpatient Hospital Stay (HOSPITAL_COMMUNITY): Payer: Medicare HMO

## 2020-11-02 DIAGNOSIS — I10 Essential (primary) hypertension: Secondary | ICD-10-CM | POA: Diagnosis not present

## 2020-11-02 DIAGNOSIS — Z515 Encounter for palliative care: Secondary | ICD-10-CM | POA: Diagnosis not present

## 2020-11-02 DIAGNOSIS — S72002A Fracture of unspecified part of neck of left femur, initial encounter for closed fracture: Secondary | ICD-10-CM | POA: Diagnosis not present

## 2020-11-02 DIAGNOSIS — W19XXXA Unspecified fall, initial encounter: Secondary | ICD-10-CM | POA: Diagnosis not present

## 2020-11-02 DIAGNOSIS — Z7189 Other specified counseling: Secondary | ICD-10-CM | POA: Diagnosis not present

## 2020-11-02 DIAGNOSIS — J9601 Acute respiratory failure with hypoxia: Secondary | ICD-10-CM | POA: Diagnosis not present

## 2020-11-02 DIAGNOSIS — L89156 Pressure-induced deep tissue damage of sacral region: Secondary | ICD-10-CM | POA: Diagnosis not present

## 2020-11-02 DIAGNOSIS — C3490 Malignant neoplasm of unspecified part of unspecified bronchus or lung: Secondary | ICD-10-CM | POA: Diagnosis not present

## 2020-11-02 LAB — COMPREHENSIVE METABOLIC PANEL
ALT: 19 U/L (ref 0–44)
AST: 9 U/L — ABNORMAL LOW (ref 15–41)
Albumin: 3.1 g/dL — ABNORMAL LOW (ref 3.5–5.0)
Alkaline Phosphatase: 67 U/L (ref 38–126)
Anion gap: 8 (ref 5–15)
BUN: 33 mg/dL — ABNORMAL HIGH (ref 8–23)
CO2: 23 mmol/L (ref 22–32)
Calcium: 9.1 mg/dL (ref 8.9–10.3)
Chloride: 106 mmol/L (ref 98–111)
Creatinine, Ser: 0.74 mg/dL (ref 0.61–1.24)
GFR, Estimated: >60 mL/min (ref >60)
Glucose, Bld: 105 mg/dL — ABNORMAL HIGH (ref 70–99)
Potassium: 4 mmol/L (ref 3.5–5.1)
Sodium: 137 mmol/L (ref 135–145)
Total Bilirubin: 0.4 mg/dL (ref 0.3–1.2)
Total Protein: 5.8 g/dL — ABNORMAL LOW (ref 6.5–8.1)

## 2020-11-02 LAB — CBC WITH DIFFERENTIAL/PLATELET
Abs Immature Granulocytes: 0.11 10*3/uL — ABNORMAL HIGH (ref 0.00–0.07)
Basophils Absolute: 0 10*3/uL (ref 0.0–0.1)
Basophils Relative: 0 %
Eosinophils Absolute: 0 10*3/uL (ref 0.0–0.5)
Eosinophils Relative: 0 %
HCT: 29.9 % — ABNORMAL LOW (ref 39.0–52.0)
Hemoglobin: 9.6 g/dL — ABNORMAL LOW (ref 13.0–17.0)
Immature Granulocytes: 2 %
Lymphocytes Relative: 13 %
Lymphs Abs: 0.7 10*3/uL (ref 0.7–4.0)
MCH: 31.3 pg (ref 26.0–34.0)
MCHC: 32.1 g/dL (ref 30.0–36.0)
MCV: 97.4 fL (ref 80.0–100.0)
Monocytes Absolute: 0.7 10*3/uL (ref 0.1–1.0)
Monocytes Relative: 12 %
Neutro Abs: 4 10*3/uL (ref 1.7–7.7)
Neutrophils Relative %: 73 %
Platelets: 279 10*3/uL (ref 150–400)
RBC: 3.07 MIL/uL — ABNORMAL LOW (ref 4.22–5.81)
RDW: 20.4 % — ABNORMAL HIGH (ref 11.5–15.5)
WBC: 5.4 10*3/uL (ref 4.0–10.5)
nRBC: 0 % (ref 0.0–0.2)

## 2020-11-02 LAB — PHOSPHORUS: Phosphorus: 3.6 mg/dL (ref 2.5–4.6)

## 2020-11-02 LAB — MAGNESIUM: Magnesium: 2.3 mg/dL (ref 1.7–2.4)

## 2020-11-02 MED ORDER — ASPIRIN 81 MG PO CHEW
81.0000 mg | CHEWABLE_TABLET | Freq: Every day | ORAL | Status: DC
  Administered 2020-11-06 – 2020-11-10 (×5): 81 mg via ORAL
  Filled 2020-11-02 (×6): qty 1

## 2020-11-02 NOTE — Progress Notes (Signed)
Physical Therapy Treatment Patient Details Name: [name] MRN: [id number] DOB: Aug 04, 1950 Today's Date: 11/02/2020    History of Present Illness Pt is 70 yo admitted 3/30 after fall and found by brother with unknown down time (1-3 days). Fractured L frontal sinus,L L3 transverse process, displaced angulated mildly comminuted L femoral neck fx. AKI with rhabdo. 3/31 pt with hypoxia requiring intubation, extubated 4/1. Pt s/p left anterior hip hemiarthroplasty 4/1. Re-intubated 4/4 for hypoxemia and transfered to ICU, extubated 10/17/20. Progress limited by orthostasis. 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 Pt prefers R lateral lean, unsure if due to dizziness or avoiding L weight-bearing from pain. Some motor planning difficulty noted with supine to sit transfer, pt verbalizes difficulty sitting up and unsure how to do it, requires mod A. Pt continues to be limited due to dizziness, tolerates remaining up in chair at EOS. Negative orthostatic BP this session. Will continue to progress acute PT interventions as able.    Follow Up Recommendations  SNF;Supervision/Assistance - 24 hour     Equipment Recommendations  Rolling walker with 5" wheels;3in1 (PT)    Recommendations for Other Services       Precautions / Restrictions Precautions Precautions: Fall Precaution Comments: constant dizziness Restrictions Weight Bearing Restrictions: No LLE Weight Bearing: Weight bearing as tolerated Other Position/Activity  Restrictions: s/p L hemi arthroplasty, anterior approach, no precautions    Mobility  Bed Mobility Overal bed mobility: Needs Assistance Bed Mobility: Supine to Sit  Supine to sit: Mod assist  General bed mobility comments: heavy multimodal cues for sequencing and motor planning to sit up at EOB, pt states "I don't know what your asking" in sidelying when cued to sit up and requires mod A to upright trunk to sitting, LE slowly mobilize to EOB without physical assist    Transfers Overall transfer level: Needs assistance Equipment used: Rolling walker (2 wheeled) Transfers: Sit to/from Stand Sit to Stand: Min assist;+2 safety/equipment    General transfer comment: min A +2 for safety, verbal cues for hand placement to power up, unsteadiness with rising, cues for midline and upright posture to decrease trunk lean R, unsure if due to dizziness or pain in LLE  Ambulation/Gait Ambulation/Gait assistance: Min assist;+2 safety/equipment Gait Distance (Feet): 3 Feet (sidesteps to chair) Assistive device: Rolling walker (2 wheeled) Gait Pattern/deviations: Shuffle;Trunk flexed Gait velocity: decreased   General Gait Details: cues for upright posture and widening BOS in standing, shuffling steps over to chair with general unsteadiness, decreased weight-shift L   Stairs             Wheelchair Mobility    Modified Rankin (Stroke Patients Only)       Balance Overall balance assessment: Needs assistance Sitting-balance support: Single extremity supported Sitting balance-Leahy Scale: Poor Sitting balance - Comments: prefers R lateral lean, requiring hands on bed to maintain upright sitting Postural control: Posterior lean Standing balance support: During functional activity;Bilateral upper extremity supported Standing balance-Leahy Scale: Poor Standing balance comment: reliant on UE support and min A +2 for safety due to  dizziness       Cognition Arousal/Alertness:  Awake/alert Behavior During Therapy: WFL for tasks assessed/performed Overall Cognitive Status: Within Functional Limits for tasks assessed     Exercises General Exercises - Lower Extremity Long Arc Quad: Seated;AROM;Strengthening;Both;20 reps Heel Raises: Seated;AROM;Strengthening;Both;20 reps    General Comments General comments (skin integrity, edema, etc.): Constant dizziness complaints, negative orthostatic vitals this session      Pertinent Vitals/Pain Pain Assessment: Faces Faces Pain Scale: Hurts a little bit Pain Location: left hip - Pain Descriptors / Indicators: Sore;Grimacing;Guarding Pain Intervention(s): Limited activity within patient's tolerance;Monitored during session;Repositioned    Home Living                      Prior Function            PT Goals (current goals can now be found in the care plan section) Acute Rehab PT Goals Patient Stated Goal: to go home PT Goal Formulation: With patient/family Time For Goal Achievement: 11/10/20 Potential to Achieve Goals: Fair Progress towards PT goals: Progressing toward goals (slowly, limited by dizziness)    Frequency    Min 3X/week      PT Plan Current plan remains appropriate    Co-evaluation PT/OT/SLP Co-Evaluation/Treatment: Yes Reason for Co-Treatment: Complexity of the patient's impairments (multi-system involvement);To address functional/ADL transfers;For patient/therapist safety PT goals addressed during session: Mobility/safety with mobility;Balance;Proper use of DME        AM-PAC PT "6 Clicks" Mobility   Outcome Measure  Help needed turning from your back to your side while in a flat bed without using bedrails?: A Little Help needed moving from lying on your back to sitting on the side of a flat bed without using bedrails?: A Little Help needed moving to and from a bed to a chair (including a wheelchair)?: A Lot Help needed standing up from a chair using your arms (e.g.,  wheelchair or bedside chair)?: A Lot Help needed to walk in hospital room?: Total Help needed climbing 3-5 steps with a railing? : Total 6 Click Score: 12    End of Session Equipment Utilized During Treatment: Gait belt Activity Tolerance: Treatment limited secondary to medical complications (Comment) (limited by dizziness) Patient left: in chair;with call bell/phone within reach;with chair alarm set Nurse Communication: Mobility status;Other (comment) (dizziness, BP) PT Visit Diagnosis: Other abnormalities of gait and mobility (R26.89);Difficulty in walking, not elsewhere classified (R26.2);Muscle weakness (generalized) (M62.81)     Time: 1829-9371 PT Time Calculation (min) (ACUTE ONLY): 23 min  Charges:  $Therapeutic Activity: 8-22 mins                      Tori Laurinda Carreno PT, DPT 11/02/20, 1:17 PM

## 2020-11-02 NOTE — Progress Notes (Signed)
   11/02/20 [id number]  Provider Notification  Provider Name/Title Jeannette Corpus NP  Date Provider Notified 11/02/20  Time Provider Notified [phone]  Notification Type Page  Notification Reason Other (Comment) (Patient refusing IV Decadron, states the medication will not let him get any sleep.)  Provider response No new orders  Date of Provider Response 11/02/20

## 2020-11-02 NOTE — Progress Notes (Signed)
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 Admitted to [facility] on 3/30 for encephalopathy following a fall with associated facial fractures. Patient was found by brother on 3/30 after he had been unable to reach patient for unknown period of time (1 to 3 days suspected). Patient fracture left frontal sinus, L3 transverse process, displaced angulated mildly comminuted left femoral neck fracture. AKI with rhabdomyolysis.   3/31 patient develop worsening hypoxia and respiratory distress, intubated.  4/2 extubated and transferred out of the ICU on 4/3. T 4/3--4/4 he developed increased work of breathing and increased oxygen needs requiring intubation. This was thought to be related to an aspiration event.  4/6 -  extubated 4/12 - questionable aspiration pneumonia, Unasyn initiated now completed Interim - worsening mental status, MRI of the brain on 4/16 shows newly diagnosed metastatic disease, transferred to [facility] for radiation oncology evaluation and steroids 4/18 - mental status improving, patient AOx4, somewhat sluggish but appropriate.  We had a lengthy discussion about goals of care, patient has not yet decided on goals of care, hospice, CODE STATUS as he is still dealing with trying to understand his new diagnosis and states he has not had a meaningful conversation with oncology about his prognosis and life expectancy.  **Interim History  On 10/30/20 the patient attempted to get out of bed without calling for staff to help and fell on the floor. Repeat Imaging done and unremarkable.  PT OT still recommending skilled nursing facility.  Patient mentation looks like it is improving.  Palliative care to have further goals of care discussion and patient's primary oncologist to call the patient and  discuss further options.  Patient wishes to be DNR now so his CODE STATUS was changed.  Palliative met with the patient and discussed his options about his feeding given his poor p.o. intake and patient wishes to hold off his NG tube today.  Patient discussed with his primary oncologist and he wishes to pursue aggressive treatment and the recommendation was for PEG tube placement so we will consult interventional radiology for placement.  Patient continues to want aggressive measures and wants to follow-up with Dr. Delton Coombes for aggressive chemotherapy.  Yesterday he refused his dexamethasone twice and was a little confused this morning.  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 [facility])   Left displaced femoral neck fracture (HCC)   Closed fracture of transverse process of lumbar vertebra (HCC)   Pressure injury of skin   Closed fracture of left hip (HCC)   Protein-calorie malnutrition, severe   Status post total replacement of left hip   Non-traumatic rhabdomyolysis   Fall   Left-sided weakness   Acute respiratory failure with hypoxia (HCC)   Oropharyngeal dysphagia   Sacral pressure ulcer   Orthostatic hypotension   Primary malignant neoplasm of lung with metastasis to brain [facility])   Essential hypertension   Thrombocytopenia (HCC)   Tobacco abuse  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 Delton Coombes with oncology and Dr. Sondra Come with rads oncology  -Currently now at [facility] for ongoing palliative radiation -Dr. Ashok Pall neurosurgery does not believe patient currently needs shunt however states if radiation oncology requires shunt placement he is available for placement -  Neurology recommends continuing radiation brain treatments and having the patient be discharged to the skilled nursing facility -C/w Supportive care  -Currently getting Radiation TX and will receive a total of 10 Tx total; will complete radiation  therapy if he continues to pursue aggressive care and if he does choose to do so we will need a PEG tube placement given his poor p.o. intake -Patient spoke with his primary oncologist and has elected for PEG tube placement.  Interventional radiology has been consulted for assistance with placement  Acute respiratory failure with hypoxia/Aspiration pneumonia/mucus plugging/likely history of emphysema requiring intubation/PPV, Resolving - 4/5 Mucus Plug likely the cause of acute hypoxia - Patient previously requiring Ventimask when sleeping given mouth breathing with NG tube in -SpO2: 93 % O2 Flow Rate (L/min): 2 L/min FiO2 (%): 21 % -No longer on Supplemental O2 via [place]   Hx Small cell lung cancer  -S/p chemotherapy Lurbinectedin last cycle 09/12/20. -Extubated 4\6 -Completed 7 days of cefepime 10/16/20. - Brovana/Yupelri nebulizer treatments and PRN albuterol.  -received Solumedrol on 4/05 during mucus plug event, dexamethasone ongoing given his brain metastasis -Completed 5-day course Unasyn 4/17 -Patient wants aggressive measures and has elected for PEG tube placement for optimizing nutrition in the setting of getting aggressive chemotherapy as an outpatient  Orthostatic Hypotension - Albumin given previously with minimal improvement -C/w TED hose 12 hours on/12 hours off - NS 50 ml/hr; Tube feeds w/ free water flushes were initiated but now stopped; patient has decided about PEG tube so we will reinitiate tube feedings -Further goals of care discussions being had and he wishes to pursue aggressive measures -Repeat orthostatic vital signs  Essential HTN/Ventricular tachycardia - The night before last reported patient had a couple runs of ventricular tachycardia in addition rise in BP.  -Most likely secondary to his multiple brain mets with hydrocephalus - Allow permissive HTN - Metoprolol 12.5 mg BID - Hydralazine PRN SBP> 170 or DBP> 110 -Last blood pressure was on the softer side  of 109/61  Nausea/vomiting - 4/14 resolved   Diarrhea;  - Resolved after stopping laxative -tube feeds discontinued for now but may need to be reinitiated; See below   Acute metabolic encephalopathy, resolving and improving however he was little confused today Cannot rule out acute hospital delirium.  - Likely in the setting of metastatic disease, acute respiratory failure, aspiration pneumonia and ICU delirium. - Continue Gabapentin,dose was reduced to 100 mg 3 times daily from 300 mg. -Remains somewhat slow to answer but more appropriate, ANO x4 for the past 72 hours -Palliative Care consulted for [place] Discussion -Patient getting Radiation Tx and currently getting 10 scheduled radiation treatments; I spoke with case management about discharging to SNF and patient will need to have his nutrition addressed and has elected for PEG tube placement  Left femoral Neck Fx S/P Hemiarthroplasty:  -PT following -Mechanical fall 10/30/2020, plain film unremarkable, orthopedic sidelined, no further imaging or intervention required -Continue to Monitor and C/w Pain Control   Left Facial Fx, L 3 TP Fx:  -Neurosurgery evaluated , no surgical intervention needed.  -Neurosurgery recommend ENT consult. -Spoke with ENT on called for [place] recommend Conservative management, Fracture is non displaced, does not need anything acute.   Rhabdomyolysis, POA -Resolved with IV fluids.   Thrombocytopenia, POA -Thrombocytopenia has now resolved and last Platelet Count was 279 -Continue to Monitor for S/Sx of Bleeding; No overt bleeding noted -Repeat CBC in the AM   Normocytic Anemia, likely chronic anemia of chronic disease -  4/6 transfused 1 unit PRBC -Patient's Hgb/Hct went from 8.7/27.4 -> 9.0/27.8 and is improved to 9.3/9.2 -Continue to Monitor for S/Sx of Bleeding -No overt bleeding noted  GERD -C/w Famotidine 20 mg po BID   Tobacco Abuse -Smoking Cessasstion counseling given  -C/w  Nicotine Patch 14 mg TD Daily   Sacral pressure ulcer, POA Pressure Injury 10/11/20 Sacrum Medial Deep Tissue Pressure Injury - Purple or maroon localized area of discolored intact skin or blood-filled blister due to damage of underlying soft tissue from pressure and/or shear. (Active)  10/11/20 0220  Location: Sacrum  Location Orientation: Medial  Staging: Deep Tissue Pressure Injury - Purple or maroon localized area of discolored intact skin or blood-filled blister due to damage of underlying soft tissue from pressure and/or shear.  Wound Description (Comments):   Present on Admission: Yes     Pressure Injury 10/15/20 Heel Right Deep Tissue Pressure Injury - Purple or maroon localized area of discolored intact skin or blood-filled blister due to damage of underlying soft tissue from pressure and/or shear. (Active)  10/15/20 0323  Location: Heel  Location Orientation: Right  Staging: Deep Tissue Pressure Injury - Purple or maroon localized area of discolored intact skin or blood-filled blister due to damage of underlying soft tissue from pressure and/or shear.  Wound Description (Comments):   Present on Admission: No     Pressure Injury 10/27/20 Knee Anterior;Right;Lateral Deep Tissue Pressure Injury - Purple or maroon localized area of discolored intact skin or blood-filled blister due to damage of underlying soft tissue from pressure and/or shear. (Active)  10/27/20 1941  Location: Knee  Location Orientation: Anterior;Right;Lateral  Staging: Deep Tissue Pressure Injury - Purple or maroon localized area of discolored intact skin or blood-filled blister due to damage of underlying soft tissue from pressure and/or shear.  Wound Description (Comments):   Present on Admission: Yes   Severe protein calorie malnutrition -Dietitian consulted and evaluated ad recommending Changing TF to Jevity 1.5 @ 60 mL/hr x 14 hours a day with Prosource TF once/day and 150 mL Free Water q4h if NGT/PEG is  placed -NG tube/core track incidentally removed yesterday morning after fall, will hold off on replacement and advance diet as tolerated. - Speech placed him on dysphagia 2 diet with thick liquid after getting a barium swallow, please see the full report-remains high risk for aspiration. - After discussion with his primary oncologist he wants to pursue aggressive measures and has elected for PEG tube placement for nutritional support -C/w Mirtazapine 30 mg po qHS for Appetite   GOALS OF CARE -Discussed with palliative care for advancement of hospice and end-of-life care discussion in the next 24 - 48h however his primary oncologist will call the patient this afternoon; brother Levada Dy is very understanding of the patient's current situation and prognosis.  Dr. Avon Gully had a lengthy talk at bedside with the patient about his poor prognosis, ongoing palliative treatment and goals.  Patient now aware advanced stage of metastatic disease with grim prognosis, he is unclear whether or not he wishes to continue with this modality of treatment if it is not curative, we also discussed possible need for discharge to location other than home given he lives alone at home and will likely not be able to care for himself in the interim or as his disease progresses. -We did discuss discharge to his brother's house versus hospice house, he does not wish to live with his brother, palliative care to continue this conversation for possible hospice house admission once  patient has completed inpatient therapies however his primary oncologist is advocating for SNF -CODE Status changed to DNR per patient's wishes after [place] discussion yesterday AM -Patient still wants to pursue full measures and aggressive chemotherapy so we will place PEG tube placement and anticipate discharging to SNF with PEG in the next few days  DVT prophylaxis: SCDS; Heparin 5,000 units sq q8h Code Status: DO NOT RESUSCITATE  Family Communication: No  family present at bedside  Disposition Plan: SNF if improved but continues to have Palliative Care Discussions  Status is: Inpatient  Remains inpatient appropriate because:Unsafe d/c plan, IV treatments appropriate due to intensity of illness or inability to take PO and Inpatient level of care appropriate due to severity of illness   Dispo:  Patient From:   Home  Planned Disposition: To be determined but likely SNF vs. Hospice depending on further palliative discussions  Medically stable for discharge:   NO    Consultants:   ENT  Neurosurgery  Palliative Care Medicine  Radiation Oncology  Medical Oncology consulted   Procedures/Significant Events:  3/30 - admitted to [facility] for encephalopathy. Facial fx, L3 TP fx, Hip fx.  3/31 non op from [place] standpoint. Ortho planned to OR 3/31 however acute hypoxic respiratory failure, transferred to ICU and emergently intubated.  3/31 ETT >>4/1 3/31 bronchoscopy with BAL left lower lobe 4/1 LT Hip Hemiarthroplasty (Anterior Approach) 4/4 ETT>>4/6 4/12Unasyn for concern of aspiration -now completed 4/16 -MRI brain showing metastatic disease  Cultures 4/4 tracheal aspirate positive rare Candida Krusei  4/4 MRSA by PCR negative  Antimicrobials:  Anti-infectives (From admission, onward)   Start     Dose/Rate Route Frequency Ordered Stop   10/23/20 1330  Ampicillin-Sulbactam (UNASYN) 3 g in sodium chloride 0.9 % 100 mL IVPB        3 g 200 mL/hr over 30 Minutes Intravenous Every 6 hours 10/23/20 1241 10/28/20 2359   10/15/20 2200  ceFEPIme (MAXIPIME) 2 g in sodium chloride 0.9 % 100 mL IVPB        2 g 200 mL/hr over 30 Minutes Intravenous Every 12 hours 10/15/20 1121 10/16/20 2332   10/15/20 0400  vancomycin (VANCOREADY) IVPB 1500 mg/300 mL  Status:  Discontinued        1,500 mg 150 mL/hr over 120 Minutes Intravenous Every 24 hours 10/15/20 0300 10/15/20 1324   10/12/20 1254  vancomycin (VANCOCIN) powder  Status:  Discontinued           As needed 10/12/20 1255 10/12/20 1328   10/11/20 2100  vancomycin (VANCOREADY) IVPB 1250 mg/250 mL  Status:  Discontinued        1,250 mg 166.7 mL/hr over 90 Minutes Intravenous Every 24 hours 10/10/20 2104 10/12/20 0834   10/11/20 1800  ceFEPIme (MAXIPIME) 2 g in sodium chloride 0.9 % 100 mL IVPB  Status:  Discontinued        2 g 200 mL/hr over 30 Minutes Intravenous Every 8 hours 10/11/20 1230 10/15/20 1121   10/11/20 1200  ceFAZolin (ANCEF) IVPB 2g/100 mL premix        2 g 200 mL/hr over 30 Minutes Intravenous On call to O.R. 10/11/20 1114 10/12/20 0559   10/10/20 2100  vancomycin (VANCOREADY) IVPB 1250 mg/250 mL        1,250 mg 166.7 mL/hr over 90 Minutes Intravenous  Once 10/10/20 2058 10/10/20 2327   10/10/20 2100  ceFEPIme (MAXIPIME) 2 g in sodium chloride 0.9 % 100 mL IVPB  Status:  Discontinued  2 g 200 mL/hr over 30 Minutes Intravenous Every 12 hours 10/10/20 2058 10/11/20 1230        Subjective: Seen and examined and he was little confused this morning per nursing but was appropriate on my examination and has elected for PEG tube placement.  After discussion with his primary oncologist he was wanting to pursue aggressive chemotherapy.  Continues to get radiation therapy.  Refused to Decadron twice yesterday given that it kept him up at night.  No other concerns or complaints at this time.  Objective: Vitals:   11/01/20 2135 11/02/20 0500 11/02/20 0538 11/02/20 0810  BP: 137/66  (!) 146/62   Pulse: 62  (!) 51   Resp: 20  18   Temp: 98 F (36.7 C)  98.3 F (36.8 C)   TempSrc: Oral  Oral   SpO2: 95%  97% 94%  Weight:  63.2 kg    Height:        Intake/Output Summary (Last 24 hours) at 11/02/2020 1610 Last data filed at 11/02/2020 0500 Gross per 24 hour  Intake --  Output 1625 ml  Net -1625 ml   Filed Weights   10/31/20 0500 11/01/20 0500 11/02/20 0500  Weight: 64.9 kg 64.7 kg 63.2 kg   Examination: Physical Exam:  Constitutional: The patient is a  thin chronically ill-appearing Caucasian male currently no acute distress appears calm resting in bed Eyes: Lids and conjunctivae normal, sclerae anicteric  ENMT: External Ears, Nose appear normal. Grossly normal hearing. Neck: Appears normal, supple, no cervical masses, normal ROM, no appreciable thyromegaly; No JVD Respiratory: Diminished to auscultation bilaterally, no wheezing, rales, rhonchi or crackles. Normal respiratory effort and patient is not tachypenic. No accessory muscle use.  Unlabored breathing Cardiovascular: RRR, no murmurs / rubs / gallops. S1 and S2 auscultated.  Minimal extremity edema Abdomen: Soft, non-tender, non-distended. Bowel sounds positive.  GU: Deferred. Musculoskeletal: No clubbing / cyanosis of digits/nails. No joint deformity upper and lower extremities.   Skin: Left hip incision appears clean dry and intact. No induration; Warm and dry.  Neurologic: CN 2-12 grossly intact with no focal deficits. Romberg sign and cerebellar reflexes not assessed.  Psychiatric: Normal judgment and insight.  He was somnolent and drowsy but easily arousable. Normal mood and appropriate affect.   Data Reviewed: I have personally reviewed following labs and imaging studies  CBC: Recent Labs  Lab 10/27/20 0122 10/28/20 0854 10/29/20 0341 10/31/20 0345 11/01/20 0407 11/02/20 0351  WBC 6.1 6.0 8.1 8.1 6.8 5.4  NEUTROABS 4.5 5.4  --   --  5.8 4.0  HGB 9.0* 8.9* 8.7* 9.0* 9.3* 9.6*  HCT 27.8* 28.4* 27.4* 27.8* 29.2* 29.9*  MCV 95.5 97.3 97.2 97.9 98.6 97.4  PLT 336 328 319 297 304 960   Basic Metabolic Panel: Recent Labs  Lab 10/27/20 0122 10/28/20 0854 10/29/20 0341 10/30/20 0409 10/31/20 0345 10/31/20 0807 11/01/20 0407 11/02/20 0351  NA 138  --  139  --   --  136 136 137  K 4.5  --  4.5  --   --  4.2 4.4 4.0  CL 106  --  108  --   --  106 106 106  CO2 25  --  25  --   --  _0 GLUCOSE 124*  --  163*  --   --  116* 123* 105*  BUN 16  --  27*  --   --  31*  29* 33*  CREATININE 0.81  --  0.83  --   --  0.72 0.74 0.74  CALCIUM 8.9  --  9.2  --   --  9.1 9.2 9.1  MG 2.2   < > 2.2 2.1 2.1 2.1 2.1 2.3  PHOS 3.9   < > 3.4 3.1 4.8* 4.1 4.2 3.6   < > = values in this interval not displayed.   GFR: Estimated Creatinine Clearance: 77.9 mL/min (by C-G formula based on SCr of 0.74 mg/dL). Liver Function Tests: Recent Labs  Lab 10/27/20 0122 10/29/20 0341 10/31/20 0807 11/01/20 0407 11/02/20 0351  AST _0 12* 9*  ALT _1 ALKPHOS 63 61 64 65 67  BILITOT 0.3 0.5 0.4 0.5 0.4  PROT 5.8* 6.2* 5.9* 5.6* 5.8*  ALBUMIN 2.9* 3.3* 3.1* 3.0* 3.1*   No results for input(s): LIPASE, AMYLASE in the last 168 hours. No results for input(s): AMMONIA in the last 168 hours. Coagulation Profile: No results for input(s): INR, PROTIME in the last 168 hours. Cardiac Enzymes: No results for input(s): CKTOTAL, CKMB, CKMBINDEX, TROPONINI in the last 168 hours. BNP (last 3 results) No results for input(s): PROBNP in the last 8760 hours. HbA1C: No results for input(s): HGBA1C in the last 72 hours. CBG: Recent Labs  Lab 10/29/20 1957 10/29/20 2351 10/30/20 0421 10/30/20 0746 10/30/20 1156  GLUCAP 158* 147* 172* 127* 116*   Lipid Profile: No results for input(s): CHOL, HDL, LDLCALC, TRIG, CHOLHDL, LDLDIRECT in the last 72 hours. Thyroid Function Tests: No results for input(s): TSH, T4TOTAL, FREET4, T3FREE, THYROIDAB in the last 72 hours. Anemia Panel: No results for input(s): VITAMINB12, FOLATE, FERRITIN, TIBC, IRON, RETICCTPCT in the last 72 hours. Sepsis Labs: Recent Labs  Lab 10/27/20 0122  PROCALCITON <0.10    No results found for this or any previous visit (from the past 240 hour(s)).   RN Pressure Injury Documentation: Pressure Injury 10/11/20 Sacrum Medial Deep Tissue Pressure Injury - Purple or maroon localized area of discolored intact skin or blood-filled blister due to damage of underlying soft tissue from pressure and/or  shear. (Active)  10/11/20 0220  Location: Sacrum  Location Orientation: Medial  Staging: Deep Tissue Pressure Injury - Purple or maroon localized area of discolored intact skin or blood-filled blister due to damage of underlying soft tissue from pressure and/or shear.  Wound Description (Comments):   Present on Admission: Yes     Pressure Injury 10/15/20 Heel Right Deep Tissue Pressure Injury - Purple or maroon localized area of discolored intact skin or blood-filled blister due to damage of underlying soft tissue from pressure and/or shear. (Active)  10/15/20 0323  Location: Heel  Location Orientation: Right  Staging: Deep Tissue Pressure Injury - Purple or maroon localized area of discolored intact skin or blood-filled blister due to damage of underlying soft tissue from pressure and/or shear.  Wound Description (Comments):   Present on Admission: No     Pressure Injury 10/27/20 Knee Anterior;Right;Lateral Deep Tissue Pressure Injury - Purple or maroon localized area of discolored intact skin or blood-filled blister due to damage of underlying soft tissue from pressure and/or shear. (Active)  10/27/20 1941  Location: Knee  Location Orientation: Anterior;Right;Lateral  Staging: Deep Tissue Pressure Injury - Purple or maroon localized area of discolored intact skin or blood-filled blister due to damage of underlying soft tissue from pressure and/or shear.  Wound Description (Comments):   Present on Admission: Yes     Estimated body mass index is 20.58 kg/m as calculated from the  following:   Height as of this encounter: _0  (1.753 m).   Weight as of this encounter: 63.2 kg.  Malnutrition Type:  Nutrition Problem: Severe Malnutrition Etiology: chronic illness (small cell carcinoma of the lung)  Malnutrition Characteristics:  Signs/Symptoms: severe muscle depletion,severe fat depletion  Nutrition Interventions:  Interventions: Magic cup,Ensure Enlive (each supplement provides  350kcal and 20 grams of protein),Boost Breeze,MVI   Radiology Studies: No results found. Scheduled Meds: . arformoterol  15 mcg Nebulization BID  . aspirin  81 mg Oral Daily  . chlorhexidine gluconate (MEDLINE KIT)  15 mL Mouth Rinse BID  . Chlorhexidine Gluconate Cloth  6 each Topical Daily  . dexamethasone (DECADRON) injection  4 mg Intravenous Q6H  . diphenoxylate-atropine  5 mL Oral Daily  . doxazosin  2 mg Oral Daily  . famotidine  20 mg Oral BID  . feeding supplement  1 Container Oral Q24H  . feeding supplement  237 mL Oral BID BM  . gabapentin  100 mg Oral Q8H  . heparin injection (subcutaneous)  5,000 Units Subcutaneous Q8H  . mouth rinse  15 mL Mouth Rinse q12n4p  . melatonin  5 mg Oral QHS  . metoprolol tartrate  12.5 mg Oral BID  . mirtazapine  30 mg Oral QHS  . nicotine  14 mg Transdermal Daily  . revefenacin  175 mcg Nebulization Daily  . sodium chloride flush  10-40 mL Intracatheter Q12H   Continuous Infusions: . sodium chloride Stopped (10/25/20 0829)  . sodium chloride 50 mL/hr at 11/01/20 1226    LOS: 23 days   Kerney Elbe, DO [facility] PAGER is on [place]  If 7PM-7AM, please contact night-coverage [email or web address]

## 2020-11-02 NOTE — Progress Notes (Signed)
   11/02/20 2143  Provider Notification  Provider Name/Title Jeannette Corpus NP  Date Provider Notified 11/02/20  Time Provider Notified 2241  Notification Type Page  Notification Reason Other (Comment) (patient has increased anxiety related to family issues and requested antianxiety medication early)  Provider response Other (Comment) (premission to give antianxiety medication early)  Date of Provider Response 11/02/20  Time of Provider Response 2243

## 2020-11-02 NOTE — Progress Notes (Signed)
Daily Progress Note   Patient Name: [name]       Date: 11/02/2020 DOB: 1951/02/19  Age: 70 y.o. MRN#: [id number] Attending Physician: Kerney Elbe, DO Primary Care Physician: [name], MD Admit Date: 10/10/2020  Reason for Consultation/Follow-up: Establishing goals of care  Subjective: I saw and examined [name].  We further discussed goals moving forward.  See below.  Length of Stay: 23  Current Medications: Scheduled Meds:  . arformoterol  15 mcg Nebulization BID  . [START ON 11/06/2020] aspirin  81 mg Oral Daily  . chlorhexidine gluconate (MEDLINE KIT)  15 mL Mouth Rinse BID  . Chlorhexidine Gluconate Cloth  6 each Topical Daily  . dexamethasone (DECADRON) injection  4 mg Intravenous Q6H  . diphenoxylate-atropine  5 mL Oral Daily  . doxazosin  2 mg Oral Daily  . famotidine  20 mg Oral BID  . feeding supplement  1 Container Oral Q24H  . feeding supplement  237 mL Oral BID BM  . gabapentin  100 mg Oral Q8H  . heparin injection (subcutaneous)  5,000 Units Subcutaneous Q8H  . mouth rinse  15 mL Mouth Rinse q12n4p  . melatonin  5 mg Oral QHS  . metoprolol tartrate  12.5 mg Oral BID  . mirtazapine  30 mg Oral QHS  . nicotine  14 mg Transdermal Daily  . revefenacin  175 mcg Nebulization Daily  . sodium chloride flush  10-40 mL Intracatheter Q12H    Continuous Infusions: . sodium chloride Stopped (10/25/20 0829)  . sodium chloride 50 mL/hr at 11/02/20 0937    PRN Meds: sodium chloride, acetaminophen **OR** acetaminophen, albuterol, ALPRAZolam, hydrALAZINE, insulin aspart, ondansetron (ZOFRAN) IV, polyethylene glycol, Resource ThickenUp Clear, sodium chloride flush  Physical Exam         Awake alert Is thin and has muscle wasting Regular work of  breathing S 1 S 2  Abdomen not distended No edema  Vital Signs: BP 109/61 (BP Location: Left Arm)   Pulse 69   Temp (!) 97.3 F (36.3 C) (Oral)   Resp 20   Ht 5' 9"  (1.753 m)   Wt 63.2 kg   SpO2 93%   BMI 20.58 kg/m  SpO2: SpO2: 93 % O2 Device: O2 Device: Room Air O2 Flow Rate: O2 Flow Rate (L/min): 2 L/min  Intake/output summary:   Intake/Output Summary (Last 24 hours) at 11/02/2020 1915 Last data filed at 11/02/2020 1500 Gross per 24 hour  Intake 120 ml  Output 1350 ml  Net -1230 ml   LBM: Last BM Date: 10/30/20 (per patient report) Baseline Weight: Weight: 66.7 kg Most recent weight: Weight: 63.2 kg       Palliative Assessment/Data: PPS: 50%    Flowsheet Rows   Flowsheet Row Most Recent Value  Intake Tab   Referral Department Hospitalist  Unit at Time of Referral Intermediate Care Unit  Date Notified 10/20/20  Palliative Care Type New Palliative care  Reason for referral Clarify Goals of Care  Date of Admission 10/10/20  Date first seen by Palliative Care 10/21/20  # of days Palliative referral response time 1 Day(s)  # of days IP prior to Palliative referral 10  Clinical Assessment   Psychosocial & Spiritual Assessment   Palliative Care Outcomes       Patient Active Problem List   Diagnosis Date Noted  . Primary malignant neoplasm of lung with metastasis to brain ([place]) 10/27/2020  . Essential hypertension 10/27/2020  . Thrombocytopenia ([place]) 10/27/2020  . Tobacco abuse 10/27/2020  . Sacral pressure ulcer 10/25/2020  . Orthostatic hypotension 10/25/2020  . Oropharyngeal dysphagia   . Status post total replacement of left hip   . Non-traumatic rhabdomyolysis   . Fall   . Left-sided weakness   . Acute respiratory failure with hypoxia ([name])   . Protein-calorie malnutrition, severe 10/16/2020  . Closed fracture of left hip ([place])   . Pressure injury of skin 10/11/2020  . Sepsis without septic shock ([place])   . Left displaced femoral neck fracture  ([place])   . Delirium   . Closed fracture of frontal sinus ([place])   . Closed fracture of transverse process of lumbar vertebra ([place])   . Goals of care, counseling/discussion 12/19/2019  . Iron deficiency anemia 09/12/2019  . Small cell lung cancer in adult [facility]) 02/05/2017  . Lymphadenopathy of head and neck 01/22/2017  . Osteopenia 03/30/2015  . Clubbing of fingers 04/14/2014  . Hyperglycemia 10/20/2013  . Smoker   . Closed dislocation of acromioclavicular joint 04/11/2010  . BREAST MASS, LEFT 01/20/2008  . ONYCHOMYCOSIS, TOENAILS 10/20/2007  . MUSCLE CRAMPS 10/20/2007  . Hypercholesterolemia 05/28/2007  . CATARACT NOS 08/28/2006  . DISTURBANCE, VISUAL NOS 08/19/2006  . COPD (chronic obstructive pulmonary disease) with emphysema ([place]) 08/19/2006  . WITHDRAWAL, DRUG 06/24/2006  . ANXIETY 06/24/2006  . DEPRESSION 06/24/2006  . MYOCARDIAL INFARCTION, HX OF 06/24/2006  . Coronary atherosclerosis 06/24/2006  . CARDIAC ARRHYTHMIA 06/24/2006  . CONSTIPATION 06/24/2006  . OSTEOARTHRITIS 06/24/2006  . LOW BACK PAIN 06/24/2006  . 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 He remains confused. Has significant dysphagia and is currently NPO with coretrak in place. Palliative medicine consulted for goals of care for this very ill man with multiple comorbidities who is at high risk of decompensation and dying.  Assessment/Recommendations/Plan -We had another discussion today regarding goals and options for his care moving forward.  He reports that he was able to talk  with Dr. Delton Coombes  yesterday and after discussion, he desires plan for continuing with aggressive interventions.  We discussed plan to finish radiation, go to SNF, and follow-up with Dr. Delton Coombes to discuss potential for further chemo/disease modifying therapy.  We discussed consideration for PEG tube placement.  Discussed burdens and benefit of this including the fact this would not prevent further aspiration.  Following discussion, he would like to pursue PEG tube placement.  I notified Dr. Alfredia Ferguson.  Goals of Care and Additional Recommendations: Limitations on Scope of Treatment: Full Scope Treatment  Code Status: Full code  Prognosis:  guarded.   Discharge Planning: To Be Determined  Care plan was discussed with patient.   Thank you for allowing the Palliative Medicine Team to assist in the care of this patient.   Total time: 40 minutes Greater than 50%  of this time was spent counseling and coordinating care related to the above assessment and plan.   Micheline Rough, MD [facility] Team [phone]  Please contact Palliative Medicine Team phone at [phone] for questions and concerns.

## 2020-11-02 NOTE — Progress Notes (Signed)
Occupational Therapy Progress Note  Patient just arrived to floor from radiation however agreeable to out of bed activity. Patient with ongoing dizziness throughout session however reports remains stable/not getting worse with position changes. BP edge of bed 132/59, after chair transfer 120/57 and RN made aware. Patient having trouble motor planning despite multimodal cues for bed mobility needing mod A for trunk support. Patient reliant on unilateral upper extremity support for sitting balance while working on crossing body midline and weight shifting to engage core core. Patient min A x2 for safety due to dizziness and unsteady with taking few steps to recliner. Poor eccentric control with sitting. Will continue to follow acutely.    11/02/20 1400  OT Visit Information  Last OT Received On 11/02/20  Assistance Needed +2 (for safety/to progress)  PT/OT/SLP Co-Evaluation/Treatment Yes  Reason for Co-Treatment To address functional/ADL transfers;For patient/therapist safety  OT goals addressed during session ADL's and self-care  History of Present Illness Pt is 70 yo admitted 3/30 after fall and found by brother with unknown down time (1-3 days). Fractured L frontal sinus,L L3 transverse process, displaced angulated mildly comminuted L femoral neck fx. AKI with rhabdo. 3/31 pt with hypoxia requiring intubation, extubated 4/1. Pt s/p left anterior hip hemiarthroplasty 4/1. Re-intubated 4/4 for hypoxemia and transfered to ICU, extubated 10/17/20. Progress limited by orthostasis. 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 patient keeps repeating that he is going home today with his brother however confirmed with nursing he does not have D/C orders  ADL  Overall ADL's  Needs assistance/impaired  Toilet Transfer Minimal assistance;+2 for safety/equipment;Stand-pivot;RW  Toilet Transfer Details (indicate cue type and reason) to recliner, with initial posterior lean needing cues to correct posture. min x2 for safety due to ongoing dizziness and poor sequencing of steps to recliner. poor eccentric control into chair  Bed Mobility  Overal bed mobility Needs Assistance  Bed Mobility Supine to Sit  Supine to sit Mod assist  General bed mobility comments difficulty comprehending multimodal cues + motor planning  to push through elbow to upright trunk to sitting needing mod A  Balance  Overall balance assessment Needs assistance  Sitting-balance support Single extremity supported  Sitting balance-Leahy Scale Poor  Postural control Posterior lean  Standing balance support During functional activity;Bilateral upper extremity supported  Standing balance-Leahy Scale Poor  Standing balance comment reliant on UE support and min A +2 for safety due to dizziness  Transfers  Overall transfer level Needs assistance  Equipment used Rolling walker (2 wheeled)  Transfers Sit to/from Stand  Sit to Stand Min assist;+2 safety/equipment  Stand pivot transfers +2 physical assistance;Min assist;+2 safety/equipment  General transfer comment please see toilet  transfer in ADL section  General Comments  General comments (skin integrity, edema,  etc.) reporting constant dizziness that does not get worse. BP at EOB 132/59, once seated in recliner 120/57 with RN made aware  Exercises  Exercises Other exercises  Other Exercises  Other Exercises worked on crossing body midline and weight shifting for trunk control performed x10 reps  OT - End of Session  Equipment Utilized During Treatment Gait belt;Rolling walker  Activity Tolerance Patient tolerated treatment well  Patient left in chair;with call bell/phone within reach;with chair alarm set;with nursing/sitter in room  Nurse Communication Mobility status  OT Assessment/Plan  OT Plan Discharge plan remains appropriate  OT Visit Diagnosis Unsteadiness on feet (R26.81);Cognitive communication deficit (R41.841);Pain  Symptoms and signs involving cognitive functions Cerebral infarction  Pain - Right/Left Left  Pain - part of body Leg  OT Frequency (ACUTE ONLY) Min 2X/week  Follow Up Recommendations SNF;Supervision/Assistance - 24 hour  OT Equipment None recommended by OT  AM-PAC OT "6 Clicks" Daily Activity Outcome Measure (Version 2)  Help from another person eating meals? 3  Help from another person taking care of personal grooming? 3  Help from another person toileting, which includes using toliet, bedpan, or urinal? 1  Help from another person bathing (including washing, rinsing, drying)? 2  Help from another person to put on and taking off regular upper body clothing? 3  Help from another person to put on and taking off regular lower body clothing? 1  6 Click Score 13  OT Goal Progression  Progress towards OT goals Not progressing toward goals - comment (ongoing dizziness with activity)  Acute Rehab OT Goals  Patient Stated Goal to go home  OT Goal Formulation With patient  Time For Goal Achievement 11/14/20  Potential to Achieve Goals Fair  ADL Goals  Pt Will Perform Grooming sitting;with  set-up;with supervision  Pt Will Perform Upper Body Bathing with min assist;sitting  Pt Will Perform Lower Body Bathing with min assist;sit to/from stand  Pt Will Transfer to Toilet with min assist;stand pivot transfer;bedside commode  Pt Will Perform Toileting - Clothing Manipulation and hygiene with mod assist;sit to/from stand;sitting/lateral leans  Additional ADL Goal #1 Pt will be Min A to come up to EOB and back into bed  Additional ADL Goal #2 Pt will follow 75% of one step commands  OT Time Calculation  OT Start Time (ACUTE ONLY) 1146  OT Stop Time (ACUTE ONLY) 1209  OT Time Calculation (min) 23 min  OT General Charges  $OT Visit 1 Visit  OT Treatments  $Self Care/Home Management  8-22 mins   Delbert Phenix OT OT pager: [phone]

## 2020-11-02 NOTE — TOC Progression Note (Addendum)
Transition of Care [facility]) - Progression Note    Patient Details  Name: [name] MRN: [id number] Date of Birth: 10/13/1950  [facility] CM/SW Contact  Purcell Mouton, RN Phone Number: 11/02/2020, 10:09 AM  Clinical Narrative:     Several calls made to pt's brother Levada Dy with no answer. A call to pt's Nurse suggest to call pt's sister. A call to pt's sister was made to explain that pt has SNF bed offers and that this CM has called Levada Dy several times with no answer or return call. Pt's sister Ms. Wynetta Emery states that she will talk to [name] (pt) and get back with CM concerning SNF. Ms Wynetta Emery was concerned about radiation and Chemo treatments that pt said he was taking.  Expected Discharge Plan: [place] Barriers to Discharge: Continued Medical Work up (Cortrak and tube feedings in place.)  Expected Discharge Plan and Services Expected Discharge Plan: [place] In-house Referral: Clinical Social Work Discharge Planning Services: CM Consult Post Acute Care Choice: [place] arrangements for the past 2 months: Single Family Home                                       Social Determinants of Health (SDOH) Interventions    Readmission Risk Interventions No flowsheet data found.

## 2020-11-03 DIAGNOSIS — I10 Essential (primary) hypertension: Secondary | ICD-10-CM | POA: Diagnosis not present

## 2020-11-03 DIAGNOSIS — W19XXXA Unspecified fall, initial encounter: Secondary | ICD-10-CM | POA: Diagnosis not present

## 2020-11-03 DIAGNOSIS — J9601 Acute respiratory failure with hypoxia: Secondary | ICD-10-CM | POA: Diagnosis not present

## 2020-11-03 DIAGNOSIS — S72002A Fracture of unspecified part of neck of left femur, initial encounter for closed fracture: Secondary | ICD-10-CM | POA: Diagnosis not present

## 2020-11-03 LAB — COMPREHENSIVE METABOLIC PANEL
ALT: 20 U/L (ref 0–44)
AST: 12 U/L — ABNORMAL LOW (ref 15–41)
Albumin: 3 g/dL — ABNORMAL LOW (ref 3.5–5.0)
Alkaline Phosphatase: 73 U/L (ref 38–126)
Anion gap: 8 (ref 5–15)
BUN: 28 mg/dL — ABNORMAL HIGH (ref 8–23)
CO2: 23 mmol/L (ref 22–32)
Calcium: 8.7 mg/dL — ABNORMAL LOW (ref 8.9–10.3)
Chloride: 105 mmol/L (ref 98–111)
Creatinine, Ser: 0.7 mg/dL (ref 0.61–1.24)
GFR, Estimated: >60 mL/min (ref >60)
Glucose, Bld: 139 mg/dL — ABNORMAL HIGH (ref 70–99)
Potassium: 3.4 mmol/L — ABNORMAL LOW (ref 3.5–5.1)
Sodium: 136 mmol/L (ref 135–145)
Total Bilirubin: <0.1 mg/dL — ABNORMAL LOW (ref 0.3–1.2)
Total Protein: 5.7 g/dL — ABNORMAL LOW (ref 6.5–8.1)

## 2020-11-03 LAB — CBC WITH DIFFERENTIAL/PLATELET
Abs Immature Granulocytes: 0.11 10*3/uL — ABNORMAL HIGH (ref 0.00–0.07)
Basophils Absolute: 0 10*3/uL (ref 0.0–0.1)
Basophils Relative: 0 %
Eosinophils Absolute: 0 10*3/uL (ref 0.0–0.5)
Eosinophils Relative: 0 %
HCT: 31.1 % — ABNORMAL LOW (ref 39.0–52.0)
Hemoglobin: 10 g/dL — ABNORMAL LOW (ref 13.0–17.0)
Immature Granulocytes: 2 %
Lymphocytes Relative: 10 %
Lymphs Abs: 0.7 10*3/uL (ref 0.7–4.0)
MCH: 32.1 pg (ref 26.0–34.0)
MCHC: 32.2 g/dL (ref 30.0–36.0)
MCV: 99.7 fL (ref 80.0–100.0)
Monocytes Absolute: 0.6 10*3/uL (ref 0.1–1.0)
Monocytes Relative: 8 %
Neutro Abs: 5.8 10*3/uL (ref 1.7–7.7)
Neutrophils Relative %: 80 %
Platelets: 275 10*3/uL (ref 150–400)
RBC: 3.12 MIL/uL — ABNORMAL LOW (ref 4.22–5.81)
RDW: 20.4 % — ABNORMAL HIGH (ref 11.5–15.5)
WBC: 7.2 10*3/uL (ref 4.0–10.5)
nRBC: 0 % (ref 0.0–0.2)

## 2020-11-03 LAB — GLUCOSE, CAPILLARY
Glucose-Capillary: 94 mg/dL (ref 70–99)
Glucose-Capillary: 199 mg/dL — ABNORMAL HIGH (ref 70–99)
Glucose-Capillary: 162 mg/dL — ABNORMAL HIGH (ref 70–99)
Glucose-Capillary: 170 mg/dL — ABNORMAL HIGH (ref 70–99)

## 2020-11-03 LAB — MAGNESIUM: Magnesium: 2 mg/dL (ref 1.7–2.4)

## 2020-11-03 LAB — PHOSPHORUS: Phosphorus: 2.8 mg/dL (ref 2.5–4.6)

## 2020-11-03 MED ORDER — POTASSIUM CHLORIDE 10 MEQ/100ML IV SOLN
10.0000 meq | INTRAVENOUS | Status: AC
  Administered 2020-11-03 (×3): 10 meq via INTRAVENOUS
  Filled 2020-11-03 (×3): qty 100

## 2020-11-03 MED ORDER — SODIUM CHLORIDE 0.9% FLUSH: 10.0000 mL | INTRAVENOUS | Status: DC | PRN

## 2020-11-03 MED ORDER — SODIUM CHLORIDE 0.9% FLUSH
10.0000 mL | Freq: Two times a day (BID) | INTRAVENOUS | Status: DC
  Administered 2020-11-03 – 2020-11-10 (×8): 10 mL

## 2020-11-03 MED ORDER — INSULIN ASPART 100 UNIT/ML ~~LOC~~ SOLN
0.0000 [IU] | SUBCUTANEOUS | Status: DC
  Administered 2020-11-03 – 2020-11-04 (×2): 2 [IU] via SUBCUTANEOUS
  Administered 2020-11-04 (×3): 1 [IU] via SUBCUTANEOUS
  Administered 2020-11-04: 2 [IU] via SUBCUTANEOUS
  Administered 2020-11-05 – 2020-11-06 (×6): 1 [IU] via SUBCUTANEOUS
  Administered 2020-11-06 (×2): 2 [IU] via SUBCUTANEOUS
  Administered 2020-11-06: 1 [IU] via SUBCUTANEOUS
  Administered 2020-11-06 – 2020-11-07 (×2): 2 [IU] via SUBCUTANEOUS
  Administered 2020-11-07: 1 [IU] via SUBCUTANEOUS
  Administered 2020-11-07: 2 [IU] via SUBCUTANEOUS
  Administered 2020-11-07: 1 [IU] via SUBCUTANEOUS
  Administered 2020-11-07 – 2020-11-08 (×3): 2 [IU] via SUBCUTANEOUS
  Administered 2020-11-08: 1 [IU] via SUBCUTANEOUS
  Administered 2020-11-08 (×2): 2 [IU] via SUBCUTANEOUS
  Administered 2020-11-09 (×3): 1 [IU] via SUBCUTANEOUS
  Administered 2020-11-09 – 2020-11-10 (×3): 2 [IU] via SUBCUTANEOUS
  Administered 2020-11-10 (×2): 1 [IU] via SUBCUTANEOUS

## 2020-11-03 NOTE — Progress Notes (Signed)
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 Admitted to [facility] on 3/30 for encephalopathy following a fall with associated facial fractures. Patient was found by brother on 3/30 after he had been unable to reach patient for unknown period of time (1 to 3 days suspected). Patient fracture left frontal sinus, L3 transverse process, displaced angulated mildly comminuted left femoral neck fracture. AKI with rhabdomyolysis.   3/31 patient develop worsening hypoxia and respiratory distress, intubated.  4/2 extubated and transferred out of the ICU on 4/3. T 4/3--4/4 he developed increased work of breathing and increased oxygen needs requiring intubation. This was thought to be related to an aspiration event.  4/6 -  extubated 4/12 - questionable aspiration pneumonia, Unasyn initiated now completed Interim - worsening mental status, MRI of the brain on 4/16 shows newly diagnosed metastatic disease, transferred to [facility] for radiation oncology evaluation and steroids 4/18 - mental status improving, patient AOx4, somewhat sluggish but appropriate.  We had a lengthy discussion about goals of care, patient has not yet decided on goals of care, hospice, CODE STATUS as he is still dealing with trying to understand his new diagnosis and states he has not had a meaningful conversation with oncology about his prognosis and life expectancy.  **Interim History  On 10/30/20 the patient attempted to get out of bed without calling for staff to help and fell on the floor. Repeat Imaging done and unremarkable.  PT OT still recommending skilled nursing facility.  Patient mentation looks like it is improving.  Palliative care to have further goals of care discussion and patient's primary oncologist to call the patient and  discuss further options.  Patient wishes to be DNR now so his CODE STATUS was changed.  Palliative met with the patient and discussed his options about his feeding given his poor p.o. intake and patient wishes to hold off his NG tube today.  Patient discussed with his primary oncologist and he wishes to pursue aggressive treatment and the recommendation was for PEG tube placement so we will consult interventional radiology for placement.  Patient continues to want aggressive measures and wants to follow-up with Dr. Delton Greer for aggressive chemotherapy.  The day before Yesterday he refused his dexamethasone twice and was a little confused in the morning but he is awake and alert today and frustrated with his Brother as he is causing him financial decline while the patient is hospitalized.  Plan is for PEG tube placement as his CT scan yesterday showed that his anatomy was amenable to PEG tube placement.  Plan is to D/C to SNF once PEG is in and bed is secured.  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 [facility])   Left displaced femoral neck fracture (HCC)   Closed fracture of transverse process of lumbar vertebra (HCC)   Pressure injury of skin   Closed fracture of left hip (HCC)   Protein-calorie malnutrition, severe   Status post total replacement of left hip   Non-traumatic rhabdomyolysis   Fall   Left-sided weakness   Acute respiratory failure with hypoxia (HCC)   Oropharyngeal dysphagia   Sacral pressure ulcer   Orthostatic hypotension   Primary malignant neoplasm of lung with metastasis to brain [facility])   Essential hypertension   Thrombocytopenia (  [place])   Tobacco abuse  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 Delton Greer with oncology and Dr. Sondra Greer with rads oncology  -Currently now at [facility] for ongoing palliative radiation -Dr. Ashok Greer neurosurgery does not believe patient currently needs shunt however states  if radiation oncology requires shunt placement he is available for placement -Neurology recommends continuing radiation brain treatments and having the patient be discharged to the skilled nursing facility -C/w Supportive care  -Currently getting Radiation TX and will receive a total of 10 Tx total; will complete radiation therapy if he continues to pursue aggressive care and if he does choose to do so we will need a PEG tube placement given his poor p.o. intake -Patient spoke with his primary oncologist and has elected for PEG tube placement.  Interventional radiology has been consulted for assistance with placement and he underwent a CT scan yesterday which showed that his anatomy was amenable for PEG placement -PEG likely be placed on Monday  Acute respiratory failure with hypoxia/Aspiration pneumonia/mucus plugging/likely history of emphysema requiring intubation/PPV, Resolving - 4/5 Mucus Plug likely the cause of acute hypoxia - Patient previously requiring Ventimask when sleeping given mouth breathing with NG tube in -SpO2: 95 % O2 Flow Rate (L/min): 2 L/min FiO2 (%): 21 % -No longer on Supplemental O2 via [place] and his respiratory status is extremely stable  Hx Small cell lung cancer  -S/p chemotherapy Lurbinectedin last cycle 09/12/20. -Extubated 4\6 -Completed 7 days of cefepime 10/16/20. - Brovana/Yupelri nebulizer treatments and PRN albuterol.  -received Solumedrol on 4/05 during mucus plug event, dexamethasone ongoing given his brain metastasis -Completed 5-day course Unasyn 4/17 -Patient wants aggressive measures and has elected for PEG tube placement for optimizing nutrition in the setting of getting aggressive chemotherapy as an outpatient  Orthostatic Hypotension - Albumin given previously with minimal improvement -C/w TED hose 12 hours on/12 hours off - NS 50 ml/hr; Tube feeds w/ free water flushes were initiated but now stopped; patient has decided about PEG tube so we will  reinitiate tube feedings -Further goals of care discussions being had and he wishes to pursue aggressive measures -Repeat orthostatic vital signs prior to discharging  Essential HTN/Ventricular tachycardia - The night before last reported patient had a couple runs of ventricular tachycardia in addition rise in BP.  -Most likely secondary to his multiple brain mets with hydrocephalus - Allow permissive HTN - Metoprolol 12.5 mg BID - Hydralazine PRN SBP> 170 or DBP> 110 -Last blood pressure was on the softer side of 110/61  Nausea/vomiting - 4/14 resolved   Diarrhea;  - Resolved after stopping laxative -tube feeds discontinued for now but may need to be reinitiated; See below   Acute metabolic encephalopathy, resolving and improving Cannot rule out acute hospital delirium.  - Likely in the setting of metastatic disease, acute respiratory failure, aspiration pneumonia and ICU delirium. - Continue Gabapentin,dose was reduced to 100 mg 3 times daily from 300 mg. -Remains somewhat slow to answer but more appropriate, ANO x4 for the past 72 hours -Palliative Care consulted for [place] Discussion -Patient getting Radiation Tx and currently getting 10 scheduled radiation treatments; I spoke with case management about discharging to SNF and patient will need to have his nutrition addressed and has elected for PEG tube placement and this is likely to be done Monday  Left femoral Neck Fx S/P Hemiarthroplasty:  -PT following -Mechanical fall 10/30/2020, plain film unremarkable, orthopedic sidelined, no further imaging  or intervention required -Continue to Monitor and C/w Pain Control   Left Facial Fx, L 3 TP Fx:  -Neurosurgery evaluated , no surgical intervention needed.  -Neurosurgery recommend ENT consult. -Spoke with ENT on called for [place] recommend Conservative management, Fracture is non displaced, does not need anything acute.   Rhabdomyolysis, POA -Resolved with IV fluids.    Thrombocytopenia, POA -Thrombocytopenia has now resolved and last Platelet Count was 275 -Continue to Monitor for S/Sx of Bleeding; No overt bleeding noted -Repeat CBC in the AM   Normocytic Anemia, likely chronic anemia of chronic disease - 4/6 transfused 1 unit PRBC -Patient's Hgb/Hct went from 8.7/27.4 -> 9.0/27.8 -> 9.3/29.2 -> 9.6/29.9 -> 10.0/31.1 -Continue to Monitor for S/Sx of Bleeding -No overt bleeding noted  GERD -C/w Famotidine 20 mg po BID   Tobacco Abuse -Smoking Cessasstion counseling given  -C/w Nicotine Patch 14 mg TD Daily   Sacral pressure ulcer, POA Pressure Injury 10/11/20 Sacrum Medial Deep Tissue Pressure Injury - Purple or maroon localized area of discolored intact skin or blood-filled blister due to damage of underlying soft tissue from pressure and/or shear. (Active)  10/11/20 0220  Location: Sacrum  Location Orientation: Medial  Staging: Deep Tissue Pressure Injury - Purple or maroon localized area of discolored intact skin or blood-filled blister due to damage of underlying soft tissue from pressure and/or shear.  Wound Description (Comments):   Present on Admission: Yes     Pressure Injury 10/15/20 Heel Right Deep Tissue Pressure Injury - Purple or maroon localized area of discolored intact skin or blood-filled blister due to damage of underlying soft tissue from pressure and/or shear. (Active)  10/15/20 0323  Location: Heel  Location Orientation: Right  Staging: Deep Tissue Pressure Injury - Purple or maroon localized area of discolored intact skin or blood-filled blister due to damage of underlying soft tissue from pressure and/or shear.  Wound Description (Comments):   Present on Admission: No     Pressure Injury 10/27/20 Knee Anterior;Right;Lateral Deep Tissue Pressure Injury - Purple or maroon localized area of discolored intact skin or blood-filled blister due to damage of underlying soft tissue from pressure and/or shear. (Active)   10/27/20 1941  Location: Knee  Location Orientation: Anterior;Right;Lateral  Staging: Deep Tissue Pressure Injury - Purple or maroon localized area of discolored intact skin or blood-filled blister due to damage of underlying soft tissue from pressure and/or shear.  Wound Description (Comments):   Present on Admission: Yes   Severe protein calorie malnutrition -Dietitian consulted and evaluated ad recommending Changing TF to Jevity 1.5 @ 60 mL/hr x 14 hours a day with Prosource TF once/day and 150 mL Free Water q4h if NGT/PEG is placed -NG tube/core track incidentally removed yesterday morning after fall, will hold off on replacement and advance diet as tolerated. - Speech placed him on dysphagia 2 diet with thick liquid after getting a barium swallow, please see the full report-remains high risk for aspiration. - After discussion with his primary oncologist he wants to pursue aggressive measures and has elected for PEG tube placement for nutritional support -C/w Mirtazapine 30 mg po qHS for Appetite   GOALS OF CARE -Discussed with palliative care for advancement of hospice and end-of-life care discussion in the next 24 - 48h however his primary oncologist will call the patient this afternoon; brother Levada Dy was very understanding of the patient's current situation and prognosis but patient now does not want to disclose any information to his brother given what the patient's brother has been  doing in the outpatient setting with the patient's finances.  -Dr. Avon Gully had a lengthy talk at bedside with the patient about his poor prognosis, ongoing palliative treatment and goals.  Patient now aware advanced stage of metastatic disease with grim prognosis but still wishes aggressive treatment -We did discuss discharge to his brother's house versus hospice house, he does not wish to live with his brother, palliative care to continue this conversation for possible hospice house admission once patient  has completed inpatient therapies however his primary oncologist is advocating for SNF which he is agreeable to -CODE Status changed to DNR per patient's wishes after [place] discussion -Patient still wants to pursue full measures and aggressive chemotherapy so we will place PEG tube placement and anticipate discharging to SNF with PEG in the next few days  DVT prophylaxis: SCDS; Heparin 5,000 units sq q8h Code Status: DO NOT RESUSCITATE  Family Communication: No family present at bedside and patient does not want any information disclosed to his brother. Disposition Plan: SNF if improved but continues to have Palliative Care Discussions  Status is: Inpatient  Remains inpatient appropriate because:Unsafe d/c plan, IV treatments appropriate due to intensity of illness or inability to take PO and Inpatient level of care appropriate due to severity of illness   Dispo:  Patient From:   Home  Planned Disposition: To be determined but likely SNF vs. Hospice depending on further palliative discussions  Medically stable for discharge:   NO    Consultants:   ENT  Neurosurgery  Palliative Care Medicine  Radiation Oncology  Medical Oncology consulted   Procedures/Significant Events:  3/30 - admitted to [facility] for encephalopathy. Facial fx, L3 TP fx, Hip fx.  3/31 non op from [place] standpoint. Ortho planned to OR 3/31 however acute hypoxic respiratory failure, transferred to ICU and emergently intubated.  3/31 ETT >>4/1 3/31 bronchoscopy with BAL left lower lobe 4/1 LT Hip Hemiarthroplasty (Anterior Approach) 4/4 ETT>>4/6 4/12Unasyn for concern of aspiration -now completed 4/16 -MRI brain showing metastatic disease  Cultures 4/4 tracheal aspirate positive rare Candida Krusei  4/4 MRSA by PCR negative  Antimicrobials:  Anti-infectives (From admission, onward)   Start     Dose/Rate Route Frequency Ordered Stop   10/23/20 1330  Ampicillin-Sulbactam (UNASYN) 3 g in sodium chloride 0.9 %  100 mL IVPB        3 g 200 mL/hr over 30 Minutes Intravenous Every 6 hours 10/23/20 1241 10/28/20 2359   10/15/20 2200  ceFEPIme (MAXIPIME) 2 g in sodium chloride 0.9 % 100 mL IVPB        2 g 200 mL/hr over 30 Minutes Intravenous Every 12 hours 10/15/20 1121 10/16/20 2332   10/15/20 0400  vancomycin (VANCOREADY) IVPB 1500 mg/300 mL  Status:  Discontinued        1,500 mg 150 mL/hr over 120 Minutes Intravenous Every 24 hours 10/15/20 0300 10/15/20 1324   10/12/20 1254  vancomycin (VANCOCIN) powder  Status:  Discontinued          As needed 10/12/20 1255 10/12/20 1328   10/11/20 2100  vancomycin (VANCOREADY) IVPB 1250 mg/250 mL  Status:  Discontinued        1,250 mg 166.7 mL/hr over 90 Minutes Intravenous Every 24 hours 10/10/20 2104 10/12/20 0834   10/11/20 1800  ceFEPIme (MAXIPIME) 2 g in sodium chloride 0.9 % 100 mL IVPB  Status:  Discontinued        2 g 200 mL/hr over 30 Minutes Intravenous Every 8 hours 10/11/20 1230  10/15/20 1121   10/11/20 1200  ceFAZolin (ANCEF) IVPB 2g/100 mL premix        2 g 200 mL/hr over 30 Minutes Intravenous On call to O.R. 10/11/20 1114 10/12/20 0559   10/10/20 2100  vancomycin (VANCOREADY) IVPB 1250 mg/250 mL        1,250 mg 166.7 mL/hr over 90 Minutes Intravenous  Once 10/10/20 2058 10/10/20 2327   10/10/20 2100  ceFEPIme (MAXIPIME) 2 g in sodium chloride 0.9 % 100 mL IVPB  Status:  Discontinued        2 g 200 mL/hr over 30 Minutes Intravenous Every 12 hours 10/10/20 2058 10/11/20 1230        Subjective: Seen and examined and very lucid and alert and oriented.  He was very frustrated at his brother about what his brother is doing this finances and his vehicles and personal possessions.  Patient became tearful because he states that he just wants to help people and that his brothers harming people. TOC has been notified and will investigate.  Patient is agreeable for PEG tube placement and this is going to be done Monday as his anatomy is amenable.  He  denies any chest pain shortness breath nausea or vomiting.  No other concerns or plans at this time.  Objective: Vitals:   11/03/20 0558 11/03/20 0605 11/03/20 0754 11/03/20 1321  BP: (!) 118/52   110/61  Pulse: 65   74  Resp: 20   14  Temp: (!) 97.5 F (36.4 C)   97.7 F (36.5 C)  TempSrc: Oral   Oral  SpO2: 98%  96% 95%  Weight:  60.3 kg    Height:        Intake/Output Summary (Last 24 hours) at 11/03/2020 1546 Last data filed at 11/03/2020 1500 Gross per 24 hour  Intake 4145.05 ml  Output 1100 ml  Net 3045.05 ml   Filed Weights   11/01/20 0500 11/02/20 0500 11/03/20 0605  Weight: 64.7 kg 63.2 kg 60.3 kg   Examination: Physical Exam:  Constitutional: Patient is a thin chronically ill-appearing Caucasian male currently no acute distress is awake and alert and oriented and frustrated with his brother and then became slightly tearful Eyes: Lids and conjunctivae normal, sclerae anicteric  ENMT: External Ears, Nose appear normal. Grossly normal hearing.  Neck: Appears normal, supple, no cervical masses, normal ROM, no appreciable thyromegaly; no JVD Respiratory: Diminished to auscultation bilaterally, no wheezing, rales, rhonchi or crackles. Normal respiratory effort and patient is not tachypenic. No accessory muscle use.  Unlabored breathing Cardiovascular: RRR, no murmurs / rubs / gallops. S1 and S2 auscultated.  Abdomen: Soft, non-tender, non-distended. Bowel sounds positive.  GU: Deferred. Musculoskeletal: No clubbing / cyanosis of digits/nails. No joint deformity upper and lower extremities.  Skin: No rashes, lesions, ulcers on limited skin evaluation but does have a left hip incision appears clean dry intact. No induration; Warm and dry.  Neurologic: CN 2-12 grossly intact with no focal deficits. Romberg sign and cerebellar reflexes not assessed.  Psychiatric: Normal judgment and insight. Alert and oriented x 3. Slightly tearful and depressed appearing mood and appropriate  affect.     Data Reviewed: I have personally reviewed following labs and imaging studies  CBC: Recent Labs  Lab 10/28/20 0854 10/29/20 0341 10/31/20 0345 11/01/20 0407 11/02/20 0351 11/03/20 0442  WBC 6.0 8.1 8.1 6.8 5.4 7.2  NEUTROABS 5.4  --   --  5.8 4.0 5.8  HGB 8.9* 8.7* 9.0* 9.3* 9.6* 10.0*  HCT  28.4* 27.4* 27.8* 29.2* 29.9* 31.1*  MCV 97.3 97.2 97.9 98.6 97.4 99.7  PLT 328 319 297 304 279 379   Basic Metabolic Panel: Recent Labs  Lab 10/29/20 0341 10/30/20 0409 10/31/20 0345 10/31/20 0807 11/01/20 0407 11/02/20 0351 11/03/20 0442  NA 139  --   --  136 136 137 136  K 4.5  --   --  4.2 4.4 4.0 3.4*  CL 108  --   --  106 106 106 105  CO2 25  --   --  _0 GLUCOSE 163*  --   --  116* 123* 105* 139*  BUN 27*  --   --  31* 29* 33* 28*  CREATININE 0.83  --   --  0.72 0.74 0.74 0.70  CALCIUM 9.2  --   --  9.1 9.2 9.1 8.7*  MG 2.2   < > 2.1 2.1 2.1 2.3 2.0  PHOS 3.4   < > 4.8* 4.1 4.2 3.6 2.8   < > = values in this interval not displayed.   GFR: Estimated Creatinine Clearance: 74.3 mL/min (by C-G formula based on SCr of 0.7 mg/dL). Liver Function Tests: Recent Labs  Lab 10/29/20 0341 10/31/20 0807 11/01/20 0407 11/02/20 0351 11/03/20 0442  AST 21 15 12* 9* 12*  ALT _1 ALKPHOS 61 64 65 67 73  BILITOT 0.5 0.4 0.5 0.4 <0.1*  PROT 6.2* 5.9* 5.6* 5.8* 5.7*  ALBUMIN 3.3* 3.1* 3.0* 3.1* 3.0*   No results for input(s): LIPASE, AMYLASE in the last 168 hours. No results for input(s): AMMONIA in the last 168 hours. Coagulation Profile: No results for input(s): INR, PROTIME in the last 168 hours. Cardiac Enzymes: No results for input(s): CKTOTAL, CKMB, CKMBINDEX, TROPONINI in the last 168 hours. BNP (last 3 results) No results for input(s): PROBNP in the last 8760 hours. HbA1C: No results for input(s): HGBA1C in the last 72 hours. CBG: Recent Labs  Lab 10/30/20 0421 10/30/20 0746 10/30/20 1156 11/03/20 0816 11/03/20 1145  GLUCAP 172*  127* 116* 94 199*   Lipid Profile: No results for input(s): CHOL, HDL, LDLCALC, TRIG, CHOLHDL, LDLDIRECT in the last 72 hours. Thyroid Function Tests: No results for input(s): TSH, T4TOTAL, FREET4, T3FREE, THYROIDAB in the last 72 hours. Anemia Panel: No results for input(s): VITAMINB12, FOLATE, FERRITIN, TIBC, IRON, RETICCTPCT in the last 72 hours. Sepsis Labs: No results for input(s): PROCALCITON, LATICACIDVEN in the last 168 hours.  No results found for this or any previous visit (from the past 240 hour(s)).   RN Pressure Injury Documentation: Pressure Injury 10/11/20 Sacrum Medial Deep Tissue Pressure Injury - Purple or maroon localized area of discolored intact skin or blood-filled blister due to damage of underlying soft tissue from pressure and/or shear. (Active)  10/11/20 0220  Location: Sacrum  Location Orientation: Medial  Staging: Deep Tissue Pressure Injury - Purple or maroon localized area of discolored intact skin or blood-filled blister due to damage of underlying soft tissue from pressure and/or shear.  Wound Description (Comments):   Present on Admission: Yes     Pressure Injury 10/15/20 Heel Right Deep Tissue Pressure Injury - Purple or maroon localized area of discolored intact skin or blood-filled blister due to damage of underlying soft tissue from pressure and/or shear. (Active)  10/15/20 0323  Location: Heel  Location Orientation: Right  Staging: Deep Tissue Pressure Injury - Purple or maroon localized area of discolored intact skin or blood-filled blister due to damage  of underlying soft tissue from pressure and/or shear.  Wound Description (Comments):   Present on Admission: No     Pressure Injury 10/27/20 Knee Anterior;Right;Lateral Deep Tissue Pressure Injury - Purple or maroon localized area of discolored intact skin or blood-filled blister due to damage of underlying soft tissue from pressure and/or shear. (Active)  10/27/20 1941  Location: Knee  Location  Orientation: Anterior;Right;Lateral  Staging: Deep Tissue Pressure Injury - Purple or maroon localized area of discolored intact skin or blood-filled blister due to damage of underlying soft tissue from pressure and/or shear.  Wound Description (Comments):   Present on Admission: Yes     Estimated body mass index is 19.64 kg/m as calculated from the following:   Height as of this encounter: _0  (1.753 m).   Weight as of this encounter: 60.3 kg.  Malnutrition Type:  Nutrition Problem: Severe Malnutrition Etiology: chronic illness (small cell carcinoma of the lung)  Malnutrition Characteristics:  Signs/Symptoms: severe muscle depletion,severe fat depletion  Nutrition Interventions:  Interventions: Magic cup,Ensure Enlive (each supplement provides 350kcal and 20 grams of protein),Boost Breeze,MVI   Radiology Studies: CT ABDOMEN WO CONTRAST  Result Date: 11/03/2020 CLINICAL DATA:  Evaluate anatomy for gastrostomy tube placement EXAM: CT ABDOMEN WITHOUT CONTRAST TECHNIQUE: Multidetector CT imaging of the abdomen was performed following the standard protocol without IV contrast. COMPARISON:  CT abdomen pelvis with contrast 10/10/2020 FINDINGS: Lower chest: Unchanged elevation of the left hemidiaphragm. Increasing opacity in the left lower lobe suspicious for progressive pneumonia. Multiple nodules again seen in the right lower lobe. Hepatobiliary: No focal liver abnormality is seen. No gallstones, gallbladder wall thickening, or biliary dilatation. Pancreas: Unremarkable. No pancreatic ductal dilatation or surrounding inflammatory changes. Spleen: Normal in size without focal abnormality. Adrenals/Urinary Tract: 3.2 cm simple cyst seen in the upper pole of the left kidney. 2 cm simple cyst seen in the anterolateral cortex of the left renal upper pole. Kidneys and ureters otherwise unremarkable Stomach/Bowel: Anatomy is amenable to percutaneous gastrostomy tube placement. No dilated loops of  bowel to indicate ileus or obstruction within the visualized abdomen. Vascular/Lymphatic: Diffuse atherosclerotic calcifications seen throughout the abdominal aorta without aneurysmal dilatation. No enlarged abdominal lymph nodes. Other: No abdominal wall hernia or abnormality. Musculoskeletal: No acute or significant osseous findings. IMPRESSION: 1. Anatomy amenable to percutaneous gastrostomy tube placement. 2. Multiple right lower lobe pulmonary nodules again seen. Follow-up chest CT recommended in 3 months. Electronically Signed   By: Miachel Roux M.D.   On: 11/03/2020 09:29   Scheduled Meds: . arformoterol  15 mcg Nebulization BID  . [START ON 11/06/2020] aspirin  81 mg Oral Daily  . chlorhexidine gluconate (MEDLINE KIT)  15 mL Mouth Rinse BID  . Chlorhexidine Gluconate Cloth  6 each Topical Daily  . dexamethasone (DECADRON) injection  4 mg Intravenous Q6H  . diphenoxylate-atropine  5 mL Oral Daily  . doxazosin  2 mg Oral Daily  . famotidine  20 mg Oral BID  . feeding supplement  1 Container Oral Q24H  . feeding supplement  237 mL Oral BID BM  . gabapentin  100 mg Oral Q8H  . heparin injection (subcutaneous)  5,000 Units Subcutaneous Q8H  . mouth rinse  15 mL Mouth Rinse q12n4p  . melatonin  5 mg Oral QHS  . metoprolol tartrate  12.5 mg Oral BID  . mirtazapine  30 mg Oral QHS  . nicotine  14 mg Transdermal Daily  . revefenacin  175 mcg Nebulization Daily  . sodium chloride flush  10-40 mL Intracatheter Q12H  . sodium chloride flush  10-40 mL Intracatheter Q12H   Continuous Infusions: . sodium chloride Stopped (10/25/20 0829)  . sodium chloride 50 mL/hr at 11/03/20 0526    LOS: 24 days   Kerney Elbe, DO [facility] is on [place]  If 7PM-7AM, please contact night-coverage [email or web address]

## 2020-11-03 NOTE — Clinical Social Work Note (Addendum)
PER PATIENT REQUEST DO NOT SPEAK TO HIS BROTHER KERRY IN REGARDS TO ANY OF HIS CARE PLANNING.  Csw to follow up on Monday to see if [facility] is following patient.  Jones Broom. Calel Pisarski, MSW, LCSW [phone]  11/03/2020 4:13 PM

## 2020-11-04 DIAGNOSIS — W19XXXA Unspecified fall, initial encounter: Secondary | ICD-10-CM | POA: Diagnosis not present

## 2020-11-04 DIAGNOSIS — I10 Essential (primary) hypertension: Secondary | ICD-10-CM | POA: Diagnosis not present

## 2020-11-04 DIAGNOSIS — J9601 Acute respiratory failure with hypoxia: Secondary | ICD-10-CM | POA: Diagnosis not present

## 2020-11-04 DIAGNOSIS — S72002A Fracture of unspecified part of neck of left femur, initial encounter for closed fracture: Secondary | ICD-10-CM | POA: Diagnosis not present

## 2020-11-04 LAB — COMPREHENSIVE METABOLIC PANEL
ALT: 18 U/L (ref 0–44)
AST: 12 U/L — ABNORMAL LOW (ref 15–41)
Albumin: 3.3 g/dL — ABNORMAL LOW (ref 3.5–5.0)
Alkaline Phosphatase: 78 U/L (ref 38–126)
Anion gap: 6 (ref 5–15)
BUN: 30 mg/dL — ABNORMAL HIGH (ref 8–23)
CO2: 24 mmol/L (ref 22–32)
Calcium: 8.8 mg/dL — ABNORMAL LOW (ref 8.9–10.3)
Chloride: 106 mmol/L (ref 98–111)
Creatinine, Ser: 0.7 mg/dL (ref 0.61–1.24)
GFR, Estimated: >60 mL/min (ref >60)
Glucose, Bld: 129 mg/dL — ABNORMAL HIGH (ref 70–99)
Potassium: 4.1 mmol/L (ref 3.5–5.1)
Sodium: 136 mmol/L (ref 135–145)
Total Bilirubin: 0.5 mg/dL (ref 0.3–1.2)
Total Protein: 6.1 g/dL — ABNORMAL LOW (ref 6.5–8.1)

## 2020-11-04 LAB — CBC WITH DIFFERENTIAL/PLATELET
Abs Immature Granulocytes: 0.18 10*3/uL — ABNORMAL HIGH (ref 0.00–0.07)
Basophils Absolute: 0 10*3/uL (ref 0.0–0.1)
Basophils Relative: 0 %
Eosinophils Absolute: 0 10*3/uL (ref 0.0–0.5)
Eosinophils Relative: 0 %
HCT: 31.6 % — ABNORMAL LOW (ref 39.0–52.0)
Hemoglobin: 10.3 g/dL — ABNORMAL LOW (ref 13.0–17.0)
Immature Granulocytes: 2 %
Lymphocytes Relative: 5 %
Lymphs Abs: 0.4 10*3/uL — ABNORMAL LOW (ref 0.7–4.0)
MCH: 32 pg (ref 26.0–34.0)
MCHC: 32.6 g/dL (ref 30.0–36.0)
MCV: 98.1 fL (ref 80.0–100.0)
Monocytes Absolute: 0.4 10*3/uL (ref 0.1–1.0)
Monocytes Relative: 5 %
Neutro Abs: 7.3 10*3/uL (ref 1.7–7.7)
Neutrophils Relative %: 88 %
Platelets: 272 10*3/uL (ref 150–400)
RBC: 3.22 MIL/uL — ABNORMAL LOW (ref 4.22–5.81)
RDW: 20.3 % — ABNORMAL HIGH (ref 11.5–15.5)
WBC: 8.2 10*3/uL (ref 4.0–10.5)
nRBC: 0 % (ref 0.0–0.2)

## 2020-11-04 LAB — GLUCOSE, CAPILLARY
Glucose-Capillary: 118 mg/dL — ABNORMAL HIGH (ref 70–99)
Glucose-Capillary: 126 mg/dL — ABNORMAL HIGH (ref 70–99)
Glucose-Capillary: 126 mg/dL — ABNORMAL HIGH (ref 70–99)
Glucose-Capillary: 144 mg/dL — ABNORMAL HIGH (ref 70–99)
Glucose-Capillary: 159 mg/dL — ABNORMAL HIGH (ref 70–99)
Glucose-Capillary: 160 mg/dL — ABNORMAL HIGH (ref 70–99)

## 2020-11-04 LAB — MAGNESIUM: Magnesium: 2.1 mg/dL (ref 1.7–2.4)

## 2020-11-04 LAB — PHOSPHORUS: Phosphorus: 3 mg/dL (ref 2.5–4.6)

## 2020-11-04 NOTE — Discharge Instructions (Signed)
° °INSTRUCTIONS AFTER JOINT REPLACEMENT  ° °o Remove items at home which could result in a fall. This includes throw rugs or furniture in walking pathways °o ICE to the affected joint every three hours while awake for 30 minutes at a time, for at least the first 3-5 days, and then as needed for pain and swelling.  Continue to use ice for pain and swelling. You may notice swelling that will progress down to the foot and ankle.  This is normal after surgery.  Elevate your leg when you are not up walking on it.   °o Continue to use the breathing machine you got in the hospital (incentive spirometer) which will help keep your temperature down.  It is common for your temperature to cycle up and down following surgery, especially at night when you are not up moving around and exerting yourself.  The breathing machine keeps your lungs expanded and your temperature down. ° ° °DIET:  As you were doing prior to hospitalization, we recommend a well-balanced diet. ° °DRESSING / WOUND CARE / SHOWERING ° °You may change your surgical dressing 7 days after surgery.  Then change the dressing every day with sterile gauze.  Please use good hand washing techniques before changing the dressing.  Do not use any lotions or creams on the incision until instructed by your surgeon.  You may shower while you have the surgical dressing which is waterproof.  After removal of surgical dressing, you must cover the incision when showering. ° °ACTIVITY ° °o Increase activity slowly as tolerated, but follow the weight bearing instructions below.   °o No driving for 6 weeks or until further direction given by your physician.  You cannot drive while taking narcotics.  °o No lifting or carrying greater than 10 lbs. until further directed by your surgeon. °o Avoid periods of inactivity such as sitting longer than an hour when not asleep. This helps prevent blood clots.  °o You may return to work once you are authorized by your doctor.  ° ° ° °WEIGHT  BEARING  ° °Weight bearing as tolerated with assist device (walker, cane, etc) as directed, use it as long as suggested by your surgeon or therapist, typically at least 4-6 weeks. ° ° °EXERCISES ° °Results after joint replacement surgery are often greatly improved when you follow the exercise, range of motion and muscle strengthening exercises prescribed by your doctor. Safety measures are also important to protect the joint from further injury. Any time any of these exercises cause you to have increased pain or swelling, decrease what you are doing until you are comfortable again and then slowly increase them. If you have problems or questions, call your caregiver or physical therapist for advice.  ° °Rehabilitation is important following a joint replacement. After just a few days of immobilization, the muscles of the leg can become weakened and shrink (atrophy).  These exercises are designed to build up the tone and strength of the thigh and leg muscles and to improve motion. Often times heat used for twenty to thirty minutes before working out will loosen up your tissues and help with improving the range of motion but do not use heat for the first two weeks following surgery (sometimes heat can increase post-operative swelling).  ° °These exercises can be done on a training (exercise) mat, on the floor, on a table or on a bed. Use whatever works the best and is most comfortable for you.    Use music or television   while you are exercising so that the exercises are a pleasant break in your day. This will make your life better with the exercises acting as a break in your routine that you can look forward to.   Perform all exercises about fifteen times, three times per day or as directed.  You should exercise both the operative leg and the other leg as well. ° °Exercises include: °  °• Quad Sets - Tighten up the muscle on the front of the thigh (Quad) and hold for 5-10 seconds.   °• Straight Leg Raises - With your  knee straight (if you were given a brace, keep it on), lift the leg to 60 degrees, hold for 3 seconds, and slowly lower the leg.  Perform this exercise against resistance later as your leg gets stronger.  °• Leg Slides: Lying on your back, slowly slide your foot toward your buttocks, bending your knee up off the floor (only go as far as is comfortable). Then slowly slide your foot back down until your leg is flat on the floor again.  °• Angel Wings: Lying on your back spread your legs to the side as far apart as you can without causing discomfort.  °• Hamstring Strength:  Lying on your back, push your heel against the floor with your leg straight by tightening up the muscles of your buttocks.  Repeat, but this time bend your knee to a comfortable angle, and push your heel against the floor.  You may put a pillow under the heel to make it more comfortable if necessary.  ° °A rehabilitation program following joint replacement surgery can speed recovery and prevent re-injury in the future due to weakened muscles. Contact your doctor or a physical therapist for more information on knee rehabilitation.  ° ° °CONSTIPATION ° °Constipation is defined medically as fewer than three stools per week and severe constipation as less than one stool per week.  Even if you have a regular bowel pattern at home, your normal regimen is likely to be disrupted due to multiple reasons following surgery.  Combination of anesthesia, postoperative narcotics, change in appetite and fluid intake all can affect your bowels.  ° °YOU MUST use at least one of the following options; they are listed in order of increasing strength to get the job done.  They are all available over the counter, and you may need to use some, POSSIBLY even all of these options:   ° °Drink plenty of fluids (prune juice may be helpful) and high fiber foods °Colace 100 mg by mouth twice a day  °Senokot for constipation as directed and as needed Dulcolax (bisacodyl), take  with full glass of water  °Miralax (polyethylene glycol) once or twice a day as needed. ° °If you have tried all these things and are unable to have a bowel movement in the first 3-4 days after surgery call either your surgeon or your primary doctor.   ° °If you experience loose stools or diarrhea, hold the medications until you stool forms back up.  If your symptoms do not get better within 1 week or if they get worse, check with your doctor.  If you experience "the worst abdominal pain ever" or develop nausea or vomiting, please contact the office immediately for further recommendations for treatment. ° ° °ITCHING:  If you experience itching with your medications, try taking only a single pain pill, or even half a pain pill at a time.  You can also use Benadryl over the   counter for itching or also to help with sleep.  ° °TED HOSE STOCKINGS:  Use stockings on both legs until for at least 2 weeks or as directed by physician office. They may be removed at night for sleeping. ° °MEDICATIONS:  See your medication summary on the “After Visit Summary” that nursing will review with you.  You may have some home medications which will be placed on hold until you complete the course of blood thinner medication.  It is important for you to complete the blood thinner medication as prescribed. ° °PRECAUTIONS:  If you experience chest pain or shortness of breath - call 911 immediately for transfer to the hospital emergency department.  ° °If you develop a fever greater that 101 F, purulent drainage from wound, increased redness or drainage from wound, foul odor from the wound/dressing, or calf pain - CONTACT YOUR SURGEON.   °                                                °FOLLOW-UP APPOINTMENTS:  If you do not already have a post-op appointment, please call the office for an appointment to be seen by your surgeon.  Guidelines for how soon to be seen are listed in your “After Visit Summary”, but are typically between 1-4 weeks  after surgery. ° °OTHER INSTRUCTIONS:  ° °Knee Replacement:  Do not place pillow under knee, focus on keeping the knee straight while resting. CPM instructions: 0-90 degrees, 2 hours in the morning, 2 hours in the afternoon, and 2 hours in the evening. Place foam block, curve side up under heel at all times except when in CPM or when walking.  DO NOT modify, tear, cut, or change the foam block in any way. ° °MAKE SURE YOU:  °• Understand these instructions.  °• Get help right away if you are not doing well or get worse.  ° ° °Thank you for letting us be a part of your medical care team.  It is a privilege we respect greatly.  We hope these instructions will help you stay on track for a fast and full recovery!  ° °Dental Antibiotics: ° °In most cases prophylactic antibiotics for Dental procdeures after total joint surgery are not necessary. ° °Exceptions are as follows: ° °1. History of prior total joint infection ° °2. Severely immunocompromised (Organ Transplant, cancer chemotherapy, Rheumatoid biologic °meds such as Humera) ° °3. Poorly controlled diabetes (A1C &gt; 8.0, blood glucose over 200) ° °If you have one of these conditions, contact your surgeon for an antibiotic prescription, prior to your °dental procedure. ° ° ° °

## 2020-11-04 NOTE — Plan of Care (Signed)
  Problem: Education: Goal: Verbalization of understanding the information provided (i.e., activity precautions, restrictions, etc) will improve Outcome: Progressing Goal: Individualized Educational Video(s) Outcome: Progressing   Problem: Activity: Goal: Ability to ambulate and perform ADLs will improve Outcome: Progressing   Problem: Clinical Measurements: Goal: Postoperative complications will be avoided or minimized Outcome: Progressing   Problem: Health Behavior/Discharge Planning: Goal: Ability to manage health-related needs will improve Outcome: Progressing   Problem: Clinical Measurements: Goal: Ability to maintain clinical measurements within normal limits will improve Outcome: Progressing Goal: Will remain free from infection Outcome: Progressing Goal: Diagnostic test results will improve Outcome: Progressing Goal: Respiratory complications will improve Outcome: Progressing Goal: Cardiovascular complication will be avoided Outcome: Progressing   Problem: Activity: Goal: Risk for activity intolerance will decrease Outcome: Progressing   Problem: Nutrition: Goal: Adequate nutrition will be maintained Outcome: Progressing   Problem: Coping: Goal: Level of anxiety will decrease Outcome: Progressing   Problem: Elimination: Goal: Will not experience complications related to bowel motility Outcome: Progressing   Problem: Pain Managment: Goal: General experience of comfort will improve Outcome: Progressing   Problem: Safety: Goal: Ability to remain free from injury will improve Outcome: Progressing   Problem: Skin Integrity: Goal: Risk for impaired skin integrity will decrease Outcome: Progressing   Problem: Safety: Goal: Non-violent Restraint(s) Outcome: Progressing

## 2020-11-04 NOTE — Progress Notes (Signed)
This RN walked into room with patient on phone attempting to reach "Dr. Raliegh Ip" to get permission to leave the hospital for the day so he can go get his cars from his brothers house before May 3rd.  I educated patient that his primary physician here would be the one to discharge him but that he could not be discharged "for the day" and come back to the hospital for the night.  I also told him that he has over a week until May 3rd so this issue did not have to be undertaken today on a Sunday April 24th.  Patient very concerned about registration and insurance for his cars.

## 2020-11-04 NOTE — Plan of Care (Signed)
  Problem: Education: Goal: Verbalization of understanding the information provided (i.e., activity precautions, restrictions, etc) will improve Outcome: Progressing Goal: Individualized Educational Video(s) Outcome: Progressing   Problem: Activity: Goal: Ability to ambulate and perform ADLs will improve Outcome: Progressing   Problem: Clinical Measurements: Goal: Postoperative complications will be avoided or minimized Outcome: Progressing   Problem: Education: Goal: Knowledge of General Education information will improve Description: Including pain rating scale, medication(s)/side effects and non-pharmacologic comfort measures Outcome: Progressing   Problem: Health Behavior/Discharge Planning: Goal: Ability to manage health-related needs will improve Outcome: Progressing   Problem: Clinical Measurements: Goal: Ability to maintain clinical measurements within normal limits will improve Outcome: Progressing Goal: Will remain free from infection Outcome: Progressing Goal: Diagnostic test results will improve Outcome: Progressing Goal: Respiratory complications will improve Outcome: Progressing Goal: Cardiovascular complication will be avoided Outcome: Progressing   Problem: Activity: Goal: Risk for activity intolerance will decrease Outcome: Progressing   Problem: Nutrition: Goal: Adequate nutrition will be maintained Outcome: Progressing   Problem: Coping: Goal: Level of anxiety will decrease Outcome: Progressing   Problem: Elimination: Goal: Will not experience complications related to bowel motility Outcome: Progressing Goal: Will not experience complications related to urinary retention Outcome: Progressing   Problem: Pain Managment: Goal: General experience of comfort will improve Outcome: Progressing

## 2020-11-04 NOTE — Progress Notes (Signed)
Subjective: 23 Days Post-Op Procedure(s) (LRB): TOTALhemi  HIP ARTHROPLASTY ANTERIOR APPROACH (Left) Patient reports pain as mild.    Objective: Vital signs in last 24 hours: Temp:  [97.7 F (36.5 C)-98 F (36.7 C)] 97.9 F (36.6 C) (04/24 0411) Pulse Rate:  [67-79] 67 (04/24 0411) Resp:  [14-20] 20 (04/24 0411) BP: (110-136)/(57-63) 120/63 (04/24 0411) SpO2:  [95 %-97 %] 97 % (04/24 0833) FiO2 (%):  [21 %] 21 % (04/23 2136) Weight:  [62.1 kg] 62.1 kg (04/24 0422)  Intake/Output from previous day: 04/23 0701 - 04/24 0700 In: 907.3 [P.O.:180; I.V.:427.3; IV Piggyback:300] Out: 1025 [Urine:1025] Intake/Output this shift: Total I/O In: 150 [P.O.:150] Out: -   Recent Labs    11/02/20 0351 11/03/20 0442 11/04/20 0304  HGB 9.6* 10.0* 10.3*   Recent Labs    11/03/20 0442 11/04/20 0304  WBC 7.2 8.2  RBC 3.12* 3.22*  HCT 31.1* 31.6*  PLT 275 272   Recent Labs    11/03/20 0442 11/04/20 0304  NA 136 136  K 3.4* 4.1  CL 105 106  CO2 23 24  BUN 28* 30*  CREATININE 0.70 0.70  GLUCOSE 139* 129*  CALCIUM 8.7* 8.8*   No results for input(s): LABPT, INR in the last 72 hours.  Neurologically intact Neurovascular intact Sensation intact distally Intact pulses distally Dorsiflexion/Plantar flexion intact Incision: dressing C/D/I No cellulitis present Compartment soft   Assessment/Plan: 23 Days Post-Op Procedure(s) (LRB): TOTALhemi  HIP ARTHROPLASTY ANTERIOR APPROACH (Left) Up with therapy WBAT LLE Please remove remaining sutures left hip xrays from a week ago after fall look ok F/u with Dr. Erlinda Hong in 3 weeks when he is 6 weeks post-op     Aundra Dubin 11/04/2020, 10:00 AM

## 2020-11-04 NOTE — Plan of Care (Signed)
Patient ate 25-50% of meals plus drank 2 Ensure on 7 a to 7 p shift, no complaints of pain. Up to chair with one moderate assist and walker, continues to be unsteady.

## 2020-11-04 NOTE — Progress Notes (Signed)
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 Admitted to [facility] on 3/30 for encephalopathy following a fall with associated facial fractures. Patient was found by brother on 3/30 after he had been unable to reach patient for unknown period of time (1 to 3 days suspected). Patient fracture left frontal sinus, L3 transverse process, displaced angulated mildly comminuted left femoral neck fracture. AKI with rhabdomyolysis.   3/31 patient develop worsening hypoxia and respiratory distress, intubated.  4/2 extubated and transferred out of the ICU on 4/3. T 4/3--4/4 he developed increased work of breathing and increased oxygen needs requiring intubation. This was thought to be related to an aspiration event.  4/6 -  extubated 4/12 - questionable aspiration pneumonia, Unasyn initiated now completed Interim - worsening mental status, MRI of the brain on 4/16 shows newly diagnosed metastatic disease, transferred to [facility] for radiation oncology evaluation and steroids 4/18 - mental status improving, patient AOx4, somewhat sluggish but appropriate.  We had a lengthy discussion about goals of care, patient has not yet decided on goals of care, hospice, CODE STATUS as he is still dealing with trying to understand his new diagnosis and states he has not had a meaningful conversation with oncology about his prognosis and life expectancy.  **Interim History  On 10/30/20 the patient attempted to get out of bed without calling for staff to help and fell on the floor. Repeat Imaging done and unremarkable.  PT OT still recommending skilled nursing facility.  Patient mentation looks like it is improving.  Palliative care to have further goals of care discussion and patient's primary oncologist to call the patient and  discuss further options.  Patient wishes to be DNR now so his CODE STATUS was changed.  Palliative met with the patient and discussed his options about his feeding given his poor p.o. intake and patient wishes to hold off his NG tube today.  Patient discussed with his primary oncologist and he wishes to pursue aggressive treatment and the recommendation was for PEG tube placement so we will consult interventional radiology for placement.  Patient continues to want aggressive measures and wants to follow-up with Dr. Delton Coombes for aggressive chemotherapy.  The day before Yesterday he refused his dexamethasone twice and was a little confused in the morning but he is awake and alert today and frustrated with his Brother as he is causing him financial decline while the patient is hospitalized.  Plan is for PEG tube placement as his CT scan showed that his anatomy was amenable to PEG tube placement.  Plan is to D/C to SNF once PEG is in and bed is secured. Patient is very upset at his brother and requests not information be divulged to him. CSW consulted to speak with patient at his request. Orthopedics re-evaulated and recommending WBAT on the LLE and recommending removing sutures of his left hip.   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 [facility])   Left displaced femoral neck fracture (HCC)   Closed fracture of transverse process of lumbar vertebra (HCC)   Pressure injury of skin   Closed fracture of left hip (HCC)   Protein-calorie malnutrition, severe   Status post total replacement of left hip   Non-traumatic rhabdomyolysis   Fall   Left-sided weakness  Acute respiratory failure with hypoxia (HCC)   Oropharyngeal dysphagia   Sacral pressure ulcer   Orthostatic hypotension   Primary malignant neoplasm of lung with metastasis to brain [facility])   Essential hypertension   Thrombocytopenia (HCC)   Tobacco abuse  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 Delton Coombes with oncology and Dr. Sondra Come with rads oncology  -Currently now at [facility] for ongoing palliative radiation -Dr. Ashok Pall neurosurgery does not believe patient currently needs shunt however states if radiation oncology requires shunt placement he is available for placement -Neurology recommends continuing radiation brain treatments and having the patient be discharged to the skilled nursing facility -C/w Supportive care  -Currently getting Radiation TX and will receive a total of 10 Tx total (started 10/28/20); will complete radiation therapy if he continues to pursue aggressive care and will need a PEG placement given his poor p.o. intake -Patient spoke with his primary oncologist and has elected for PEG tube placement.  Interventional radiology has been consulted for assistance with placement and he underwent a CT scan yesterday which showed that his anatomy was amenable for PEG placement -PEG likely be placed on Monday  Acute respiratory failure with hypoxia/Aspiration pneumonia/mucus plugging/likely history of emphysema requiring intubation/PPV, Resolving - 4/5 Mucus Plug likely the cause of acute hypoxia - Patient previously requiring Ventimask when sleeping given mouth breathing with NG tube in -SpO2: 96 % O2 Flow Rate (L/min): 2 L/min FiO2 (%): 21 % -No longer on Supplemental O2 via [place] and his respiratory status is extremely stable  Hx Small cell lung cancer  -S/p chemotherapy Lurbinectedin last cycle 09/12/20. -Extubated 4\6 -Completed 7 days of cefepime 10/16/20. - Brovana/Yupelri nebulizer treatments and PRN albuterol.  -received Solumedrol on 4/05 during mucus plug event, dexamethasone ongoing given his brain metastasis -Completed 5-day course Unasyn 4/17 -Patient wants aggressive measures and has elected for PEG tube placement for optimizing nutrition in the setting of getting aggressive chemotherapy as an outpatient  Orthostatic Hypotension -  Albumin given previously with minimal improvement -C/w TED hose 12 hours on/12 hours off - NS 50 ml/hr; Tube feeds w/ free water flushes were initiated but now stopped; patient has decided about PEG tube so we will reinitiate tube feedings -Further goals of care discussions being had and he wishes to pursue aggressive measures -Repeat orthostatic vital signs prior to discharging and will need to be done in the AM   Essential HTN/Ventricular Tachycardia - The night before last reported patient had a couple runs of ventricular tachycardia in addition rise in BP.  -Most likely secondary to his multiple brain mets with hydrocephalus - Allow permissive HTN - Metoprolol 12.5 mg BID - Hydralazine PRN SBP> 170 or DBP> 110 -Last blood pressure was on the softer side of 115/52  Nausea/vomiting - 4/14 resolved   Diarrhea;  - Resolved after stopping laxative -tube feeds discontinued for now but may need to be reinitiated; See below   Acute metabolic encephalopathy, resolving and improving Cannot rule out acute hospital delirium.  - Likely in the setting of metastatic disease, acute respiratory failure, aspiration pneumonia and ICU delirium. - Continue Gabapentin,dose was reduced to 100 mg 3 times daily from 300 mg. -Remains somewhat slow to answer but more appropriate, ANO x4 for the past 72 hours -Palliative Care consulted for [place] Discussion -Patient getting Radiation Tx and currently getting 10 scheduled radiation treatments; I spoke with case management about discharging to SNF and patient will need  to have his nutrition addressed and has elected for PEG tube placement and this is likely to be done Monday  Left femoral Neck Fx S/P Hemiarthroplasty:  -PT following -Mechanical fall 10/30/2020, plain film unremarkable, orthopedic sidelined, no further imaging or intervention required -Continue to Monitor and C/w Pain Control   Left Facial Fx, L 3 TP Fx:  -Neurosurgery evaluated , no  surgical intervention needed.  -Neurosurgery recommend ENT consult. -Spoke with ENT on called for [place] recommend Conservative management, Fracture is non displaced, does not need anything acute.   Rhabdomyolysis, POA -Resolved with IV fluids.   Thrombocytopenia, POA -Thrombocytopenia has now resolved and last Platelet Count was 272 -Continue to Monitor for S/Sx of Bleeding; No overt bleeding noted -Repeat CBC in the AM   Normocytic Anemia, likely chronic anemia of chronic disease - 4/6 transfused 1 unit PRBC -Patient's Hgb/Hct went from 8.7/27.4 -> 9.0/27.8 -> 9.3/29.2 -> 9.6/29.9 -> 10.0/31.1 -> 10.3/31.6 -Continue to Monitor for S/Sx of Bleeding -No overt bleeding noted  GERD -C/w Famotidine 20 mg po BID   Tobacco Abuse -Smoking Cessasstion counseling given  -C/w Nicotine Patch 14 mg TD Daily   Sacral pressure ulcer, POA Pressure Injury 10/11/20 Sacrum Medial Deep Tissue Pressure Injury - Purple or maroon localized area of discolored intact skin or blood-filled blister due to damage of underlying soft tissue from pressure and/or shear. (Active)  10/11/20 0220  Location: Sacrum  Location Orientation: Medial  Staging: Deep Tissue Pressure Injury - Purple or maroon localized area of discolored intact skin or blood-filled blister due to damage of underlying soft tissue from pressure and/or shear.  Wound Description (Comments):   Present on Admission: Yes     Pressure Injury 10/15/20 Heel Right Deep Tissue Pressure Injury - Purple or maroon localized area of discolored intact skin or blood-filled blister due to damage of underlying soft tissue from pressure and/or shear. (Active)  10/15/20 0323  Location: Heel  Location Orientation: Right  Staging: Deep Tissue Pressure Injury - Purple or maroon localized area of discolored intact skin or blood-filled blister due to damage of underlying soft tissue from pressure and/or shear.  Wound Description (Comments):   Present on  Admission: No     Pressure Injury 10/27/20 Knee Anterior;Right;Lateral Deep Tissue Pressure Injury - Purple or maroon localized area of discolored intact skin or blood-filled blister due to damage of underlying soft tissue from pressure and/or shear. (Active)  10/27/20 1941  Location: Knee  Location Orientation: Anterior;Right;Lateral  Staging: Deep Tissue Pressure Injury - Purple or maroon localized area of discolored intact skin or blood-filled blister due to damage of underlying soft tissue from pressure and/or shear.  Wound Description (Comments):   Present on Admission: Yes   Severe protein calorie malnutrition -Dietitian consulted and evaluated ad recommending Changing TF to Jevity 1.5 @ 60 mL/hr x 14 hours a day with Prosource TF once/day and 150 mL Free Water q4h if NGT/PEG is placed -NG tube/core track incidentally removed yesterday morning after fall, will hold off on replacement and advance diet as tolerated. - Speech placed him on dysphagia 2 diet with thick liquid after getting a barium swallow, please see the full report-remains high risk for aspiration. - After discussion with his primary oncologist he wants to pursue aggressive measures and has elected for PEG tube placement for nutritional support -C/w Mirtazapine 30 mg po qHS for Appetite   GOALS OF CARE -Discussed with palliative care for advancement of hospice and end-of-life care discussion in the next 24 -  48h however his primary oncologist will call the patient this afternoon; brother Levada Dy was very understanding of the patient's current situation and prognosis but patient now does not want to disclose any information to his brother given what the patient's brother has been doing in the outpatient setting with the patient's finances.  -Dr. Avon Gully had a lengthy talk at bedside with the patient about his poor prognosis, ongoing palliative treatment and goals.  Patient now aware advanced stage of metastatic disease with grim  prognosis but still wishes aggressive treatment -We did discuss discharge to his brother's house versus hospice house, he does not wish to live with his brother, palliative care to continue this conversation for possible hospice house admission once patient has completed inpatient therapies however his primary oncologist is advocating for SNF which he is agreeable to -CODE Status changed to DNR per patient's wishes after [place] discussion -Patient still wants to pursue full measures and aggressive chemotherapy so we will place PEG tube placement and anticipate discharging to SNF with PEG in the next few days  DVT prophylaxis: SCDS; Heparin 5,000 units sq q8h Code Status: DO NOT RESUSCITATE  Family Communication: No family present at bedside and patient does not want any information disclosed to his brother. Disposition Plan: SNF if improved but continues to have Palliative Care Discussions  Status is: Inpatient  Remains inpatient appropriate because:Unsafe d/c plan, IV treatments appropriate due to intensity of illness or inability to take PO and Inpatient level of care appropriate due to severity of illness   Dispo:  Patient From:   Home  Planned Disposition: To be determined but likely SNF vs. Hospice depending on further palliative discussions  Medically stable for discharge:   NO    Consultants:   ENT  Neurosurgery  Palliative Care Medicine  Radiation Oncology  Medical Oncology consulted   Procedures/Significant Events:  3/30 - admitted to [facility] for encephalopathy. Facial fx, L3 TP fx, Hip fx.  3/31 non op from [place] standpoint. Ortho planned to OR 3/31 however acute hypoxic respiratory failure, transferred to ICU and emergently intubated.  3/31 ETT >>4/1 3/31 bronchoscopy with BAL left lower lobe 4/1 LT Hip Hemiarthroplasty (Anterior Approach) 4/4 ETT>>4/6 4/12Unasyn for concern of aspiration -now completed 4/16 -MRI brain showing metastatic disease  Cultures 4/4  tracheal aspirate positive rare Candida Krusei  4/4 MRSA by PCR negative  Antimicrobials:  Anti-infectives (From admission, onward)   Start     Dose/Rate Route Frequency Ordered Stop   10/23/20 1330  Ampicillin-Sulbactam (UNASYN) 3 g in sodium chloride 0.9 % 100 mL IVPB        3 g 200 mL/hr over 30 Minutes Intravenous Every 6 hours 10/23/20 1241 10/28/20 2359   10/15/20 2200  ceFEPIme (MAXIPIME) 2 g in sodium chloride 0.9 % 100 mL IVPB        2 g 200 mL/hr over 30 Minutes Intravenous Every 12 hours 10/15/20 1121 10/16/20 2332   10/15/20 0400  vancomycin (VANCOREADY) IVPB 1500 mg/300 mL  Status:  Discontinued        1,500 mg 150 mL/hr over 120 Minutes Intravenous Every 24 hours 10/15/20 0300 10/15/20 1324   10/12/20 1254  vancomycin (VANCOCIN) powder  Status:  Discontinued          As needed 10/12/20 1255 10/12/20 1328   10/11/20 2100  vancomycin (VANCOREADY) IVPB 1250 mg/250 mL  Status:  Discontinued        1,250 mg 166.7 mL/hr over 90 Minutes Intravenous Every 24 hours 10/10/20 2104  10/12/20 0834   10/11/20 1800  ceFEPIme (MAXIPIME) 2 g in sodium chloride 0.9 % 100 mL IVPB  Status:  Discontinued        2 g 200 mL/hr over 30 Minutes Intravenous Every 8 hours 10/11/20 1230 10/15/20 1121   10/11/20 1200  ceFAZolin (ANCEF) IVPB 2g/100 mL premix        2 g 200 mL/hr over 30 Minutes Intravenous On call to O.R. 10/11/20 1114 10/12/20 0559   10/10/20 2100  vancomycin (VANCOREADY) IVPB 1250 mg/250 mL        1,250 mg 166.7 mL/hr over 90 Minutes Intravenous  Once 10/10/20 2058 10/10/20 2327   10/10/20 2100  ceFEPIme (MAXIPIME) 2 g in sodium chloride 0.9 % 100 mL IVPB  Status:  Discontinued        2 g 200 mL/hr over 30 Minutes Intravenous Every 12 hours 10/10/20 2058 10/11/20 1230        Subjective: Seen and examined and he was little sleepy from his sleeping pill.  Still very frustrated with his brother about his finances and his vehicles and personal possessions.  Plans for PEG tube in  the morning.  No other concerns or complaints at this time and still wanted to speak with a Education officer, museum.  Objective: Vitals:   11/04/20 0411 11/04/20 0422 11/04/20 0833 11/04/20 1250  BP: 120/63   (!) 115/52  Pulse: 67   63  Resp: 20   16  Temp: 97.9 F (36.6 C)   (!) 97.5 F (36.4 C)  TempSrc:    Oral  SpO2: 96%  97% 96%  Weight:  62.1 kg    Height:        Intake/Output Summary (Last 24 hours) at 11/04/2020 1432 Last data filed at 11/04/2020 1308 Gross per 24 hour  Intake 697.26 ml  Output 1875 ml  Net -1177.74 ml   Filed Weights   11/02/20 0500 11/03/20 0605 11/04/20 0422  Weight: 63.2 kg 60.3 kg 62.1 kg   Examination: Physical Exam:  Constitutional: The patient is a thin chronically ill-appearing Caucasian male currently no acute distress who is resting and still very frustrated with his brother and family situation Eyes: Lids and conjunctivae normal, sclerae anicteric  ENMT: External Ears, Nose appear normal. Grossly normal hearing.  Neck: Appears normal, supple, no cervical masses, normal ROM, no appreciable thyromegaly; no JVD Respiratory: Diminished to auscultation bilaterally, no wheezing, rales, rhonchi or crackles. Normal respiratory effort and patient is not tachypenic. No accessory muscle use.  Unlabored breathing Cardiovascular: RRR, no murmurs / rubs / gallops. S1 and S2 auscultated. Abdomen: Soft, non-tender, non-distended. Bowel sounds positive.  GU: Deferred. Musculoskeletal: No clubbing / cyanosis of digits/nails. No joint deformity upper and lower extremities.  Skin: No rashes, lesions, ulcers on limited skin evaluation. No induration; Warm and dry.  Neurologic: CN 2-12 grossly intact with no focal deficits. Romberg sign and cerebellar reflexes not assessed.  Psychiatric: Normal judgment and insight. Alert and oriented x 3. Normal mood and appropriate affect.     Data Reviewed: I have personally reviewed following labs and imaging studies  CBC: Recent  Labs  Lab 10/31/20 0345 11/01/20 0407 11/02/20 0351 11/03/20 0442 11/04/20 0304  WBC 8.1 6.8 5.4 7.2 8.2  NEUTROABS  --  5.8 4.0 5.8 7.3  HGB 9.0* 9.3* 9.6* 10.0* 10.3*  HCT 27.8* 29.2* 29.9* 31.1* 31.6*  MCV 97.9 98.6 97.4 99.7 98.1  PLT 297 304 279 275 774   Basic Metabolic Panel: Recent Labs  Lab  10/31/20 0807 11/01/20 0407 11/02/20 0351 11/03/20 0442 11/04/20 0304  NA 136 136 137 136 136  K 4.2 4.4 4.0 3.4* 4.1  CL 106 106 106 105 106  CO2 _0 GLUCOSE 116* 123* 105* 139* 129*  BUN 31* 29* 33* 28* 30*  CREATININE 0.72 0.74 0.74 0.70 0.70  CALCIUM 9.1 9.2 9.1 8.7* 8.8*  MG 2.1 2.1 2.3 2.0 2.1  PHOS 4.1 4.2 3.6 2.8 3.0   GFR: Estimated Creatinine Clearance: 76.5 mL/min (by C-G formula based on SCr of 0.7 mg/dL). Liver Function Tests: Recent Labs  Lab 10/31/20 0807 11/01/20 0407 11/02/20 0351 11/03/20 0442 11/04/20 0304  AST 15 12* 9* 12* 12*  ALT _1 ALKPHOS 64 65 67 73 78  BILITOT 0.4 0.5 0.4 <0.1* 0.5  PROT 5.9* 5.6* 5.8* 5.7* 6.1*  ALBUMIN 3.1* 3.0* 3.1* 3.0* 3.3*   No results for input(s): LIPASE, AMYLASE in the last 168 hours. No results for input(s): AMMONIA in the last 168 hours. Coagulation Profile: No results for input(s): INR, PROTIME in the last 168 hours. Cardiac Enzymes: No results for input(s): CKTOTAL, CKMB, CKMBINDEX, TROPONINI in the last 168 hours. BNP (last 3 results) No results for input(s): PROBNP in the last 8760 hours. HbA1C: No results for input(s): HGBA1C in the last 72 hours. CBG: Recent Labs  Lab 11/03/20 2005 11/04/20 0009 11/04/20 0413 11/04/20 0736 11/04/20 1132  GLUCAP 170* 144* 126* 118* 126*   Lipid Profile: No results for input(s): CHOL, HDL, LDLCALC, TRIG, CHOLHDL, LDLDIRECT in the last 72 hours. Thyroid Function Tests: No results for input(s): TSH, T4TOTAL, FREET4, T3FREE, THYROIDAB in the last 72 hours. Anemia Panel: No results for input(s): VITAMINB12, FOLATE, FERRITIN, TIBC,  IRON, RETICCTPCT in the last 72 hours. Sepsis Labs: No results for input(s): PROCALCITON, LATICACIDVEN in the last 168 hours.  No results found for this or any previous visit (from the past 240 hour(s)).   RN Pressure Injury Documentation: Pressure Injury 10/11/20 Sacrum Medial Deep Tissue Pressure Injury - Purple or maroon localized area of discolored intact skin or blood-filled blister due to damage of underlying soft tissue from pressure and/or shear. (Active)  10/11/20 0220  Location: Sacrum  Location Orientation: Medial  Staging: Deep Tissue Pressure Injury - Purple or maroon localized area of discolored intact skin or blood-filled blister due to damage of underlying soft tissue from pressure and/or shear.  Wound Description (Comments):   Present on Admission: Yes     Pressure Injury 10/15/20 Heel Right Deep Tissue Pressure Injury - Purple or maroon localized area of discolored intact skin or blood-filled blister due to damage of underlying soft tissue from pressure and/or shear. (Active)  10/15/20 0323  Location: Heel  Location Orientation: Right  Staging: Deep Tissue Pressure Injury - Purple or maroon localized area of discolored intact skin or blood-filled blister due to damage of underlying soft tissue from pressure and/or shear.  Wound Description (Comments):   Present on Admission: No     Pressure Injury 10/27/20 Knee Anterior;Right;Lateral Deep Tissue Pressure Injury - Purple or maroon localized area of discolored intact skin or blood-filled blister due to damage of underlying soft tissue from pressure and/or shear. (Active)  10/27/20 1941  Location: Knee  Location Orientation: Anterior;Right;Lateral  Staging: Deep Tissue Pressure Injury - Purple or maroon localized area of discolored intact skin or blood-filled blister due to damage of underlying soft tissue from pressure and/or shear.  Wound Description (Comments):   Present on  Admission: Yes   Estimated body mass index  is 20.23 kg/m as calculated from the following:   Height as of this encounter: _0  (1.753 m).   Weight as of this encounter: 62.1 kg.  Malnutrition Type:  Nutrition Problem: Severe Malnutrition Etiology: chronic illness (small cell carcinoma of the lung)  Malnutrition Characteristics:  Signs/Symptoms: severe muscle depletion,severe fat depletion  Nutrition Interventions:  Interventions: Magic cup,Ensure Enlive (each supplement provides 350kcal and 20 grams of protein),Boost Breeze,MVI   Radiology Studies: CT ABDOMEN WO CONTRAST  Result Date: 11/03/2020 CLINICAL DATA:  Evaluate anatomy for gastrostomy tube placement EXAM: CT ABDOMEN WITHOUT CONTRAST TECHNIQUE: Multidetector CT imaging of the abdomen was performed following the standard protocol without IV contrast. COMPARISON:  CT abdomen pelvis with contrast 10/10/2020 FINDINGS: Lower chest: Unchanged elevation of the left hemidiaphragm. Increasing opacity in the left lower lobe suspicious for progressive pneumonia. Multiple nodules again seen in the right lower lobe. Hepatobiliary: No focal liver abnormality is seen. No gallstones, gallbladder wall thickening, or biliary dilatation. Pancreas: Unremarkable. No pancreatic ductal dilatation or surrounding inflammatory changes. Spleen: Normal in size without focal abnormality. Adrenals/Urinary Tract: 3.2 cm simple cyst seen in the upper pole of the left kidney. 2 cm simple cyst seen in the anterolateral cortex of the left renal upper pole. Kidneys and ureters otherwise unremarkable Stomach/Bowel: Anatomy is amenable to percutaneous gastrostomy tube placement. No dilated loops of bowel to indicate ileus or obstruction within the visualized abdomen. Vascular/Lymphatic: Diffuse atherosclerotic calcifications seen throughout the abdominal aorta without aneurysmal dilatation. No enlarged abdominal lymph nodes. Other: No abdominal wall hernia or abnormality. Musculoskeletal: No acute or significant  osseous findings. IMPRESSION: 1. Anatomy amenable to percutaneous gastrostomy tube placement. 2. Multiple right lower lobe pulmonary nodules again seen. Follow-up chest CT recommended in 3 months. Electronically Signed   By: Miachel Roux M.D.   On: 11/03/2020 09:29   Scheduled Meds: . arformoterol  15 mcg Nebulization BID  . [START ON 11/06/2020] aspirin  81 mg Oral Daily  . chlorhexidine gluconate (MEDLINE KIT)  15 mL Mouth Rinse BID  . Chlorhexidine Gluconate Cloth  6 each Topical Daily  . dexamethasone (DECADRON) injection  4 mg Intravenous Q6H  . diphenoxylate-atropine  5 mL Oral Daily  . doxazosin  2 mg Oral Daily  . famotidine  20 mg Oral BID  . feeding supplement  1 Container Oral Q24H  . feeding supplement  237 mL Oral BID BM  . gabapentin  100 mg Oral Q8H  . heparin injection (subcutaneous)  5,000 Units Subcutaneous Q8H  . insulin aspart  0-9 Units Subcutaneous Q4H  . mouth rinse  15 mL Mouth Rinse q12n4p  . melatonin  5 mg Oral QHS  . metoprolol tartrate  12.5 mg Oral BID  . mirtazapine  30 mg Oral QHS  . nicotine  14 mg Transdermal Daily  . revefenacin  175 mcg Nebulization Daily  . sodium chloride flush  10-40 mL Intracatheter Q12H  . sodium chloride flush  10-40 mL Intracatheter Q12H   Continuous Infusions: . sodium chloride Stopped (10/25/20 0829)  . sodium chloride 50 mL/hr at 11/04/20 0523    LOS: 25 days   Kerney Elbe, DO [facility] PAGER is on [place]  If 7PM-7AM, please contact night-coverage [email or web address]

## 2020-11-05 ENCOUNTER — Inpatient Hospital Stay (HOSPITAL_COMMUNITY): Payer: Medicare HMO

## 2020-11-05 ENCOUNTER — Ambulatory Visit
Admit: 2020-11-05 | Discharge: 2020-11-05 | Disposition: A | Discharge status: Home / Self Care | Payer: Medicare HMO | Attending: Radiation Oncology | Admitting: Radiation Oncology

## 2020-11-05 LAB — COMPREHENSIVE METABOLIC PANEL
ALT: 16 U/L (ref 0–44)
AST: 14 U/L — ABNORMAL LOW (ref 15–41)
Albumin: 3 g/dL — ABNORMAL LOW (ref 3.5–5.0)
Alkaline Phosphatase: 72 U/L (ref 38–126)
Anion gap: 6 (ref 5–15)
BUN: 31 mg/dL — ABNORMAL HIGH (ref 8–23)
CO2: 25 mmol/L (ref 22–32)
Calcium: 8.8 mg/dL — ABNORMAL LOW (ref 8.9–10.3)
Chloride: 104 mmol/L (ref 98–111)
Creatinine, Ser: 0.75 mg/dL (ref 0.61–1.24)
GFR, Estimated: >60 mL/min (ref >60)
Glucose, Bld: 134 mg/dL — ABNORMAL HIGH (ref 70–99)
Potassium: 4.3 mmol/L (ref 3.5–5.1)
Sodium: 135 mmol/L (ref 135–145)
Total Bilirubin: 0.5 mg/dL (ref 0.3–1.2)
Total Protein: 5.8 g/dL — ABNORMAL LOW (ref 6.5–8.1)

## 2020-11-05 LAB — CBC WITH DIFFERENTIAL/PLATELET
Abs Immature Granulocytes: 0.39 10*3/uL — ABNORMAL HIGH (ref 0.00–0.07)
Basophils Absolute: 0 10*3/uL (ref 0.0–0.1)
Basophils Relative: 0 %
Eosinophils Absolute: 0 10*3/uL (ref 0.0–0.5)
Eosinophils Relative: 0 %
HCT: 30.1 % — ABNORMAL LOW (ref 39.0–52.0)
Hemoglobin: 9.8 g/dL — ABNORMAL LOW (ref 13.0–17.0)
Immature Granulocytes: 4 %
Lymphocytes Relative: 4 %
Lymphs Abs: 0.3 10*3/uL — ABNORMAL LOW (ref 0.7–4.0)
MCH: 31.9 pg (ref 26.0–34.0)
MCHC: 32.6 g/dL (ref 30.0–36.0)
MCV: 98 fL (ref 80.0–100.0)
Monocytes Absolute: 0.5 10*3/uL (ref 0.1–1.0)
Monocytes Relative: 5 %
Neutro Abs: 7.8 10*3/uL — ABNORMAL HIGH (ref 1.7–7.7)
Neutrophils Relative %: 87 %
Platelets: 225 10*3/uL (ref 150–400)
RBC: 3.07 MIL/uL — ABNORMAL LOW (ref 4.22–5.81)
RDW: 20.4 % — ABNORMAL HIGH (ref 11.5–15.5)
WBC: 8.9 10*3/uL (ref 4.0–10.5)
nRBC: 0 % (ref 0.0–0.2)

## 2020-11-05 LAB — GLUCOSE, CAPILLARY
Glucose-Capillary: 119 mg/dL — ABNORMAL HIGH (ref 70–99)
Glucose-Capillary: 121 mg/dL — ABNORMAL HIGH (ref 70–99)
Glucose-Capillary: 127 mg/dL — ABNORMAL HIGH (ref 70–99)
Glucose-Capillary: 130 mg/dL — ABNORMAL HIGH (ref 70–99)
Glucose-Capillary: 135 mg/dL — ABNORMAL HIGH (ref 70–99)
Glucose-Capillary: 145 mg/dL — ABNORMAL HIGH (ref 70–99)
Glucose-Capillary: 155 mg/dL — ABNORMAL HIGH (ref 70–99)

## 2020-11-05 LAB — MAGNESIUM: Magnesium: 2 mg/dL (ref 1.7–2.4)

## 2020-11-05 LAB — PHOSPHORUS: Phosphorus: 3 mg/dL (ref 2.5–4.6)

## 2020-11-05 MED ORDER — ORAL CARE MOUTH RINSE
15.0000 mL | Freq: Two times a day (BID) | OROMUCOSAL | Status: DC
  Administered 2020-11-05 – 2020-11-10 (×11): 15 mL via OROMUCOSAL

## 2020-11-05 MED ORDER — CHLORHEXIDINE GLUCONATE 0.12 % MT SOLN
15.0000 mL | Freq: Two times a day (BID) | OROMUCOSAL | Status: DC
  Administered 2020-11-05 – 2020-11-10 (×10): 15 mL via OROMUCOSAL
  Filled 2020-11-05 (×11): qty 15

## 2020-11-05 NOTE — Progress Notes (Signed)
Modified Barium Swallow Progress Note  Patient Details  Name: [name] MRN: [id number] Date of Birth: [birth year]  Today's Date: 11/05/2020  Modified Barium Swallow completed.  Full report located under Chart Review in the Imaging Section.  Brief recommendations include the following:  Clinical Impression  Patient presents with an improved swallow function as compared to previous MBS on 4/11. He continues to exhibit oral phase delays in transit of liquids and solids, leading to premature spillage of puree and regular solids into vallecular sinus. Pharyngeally, he continues to exhibit consistent penetration of thin liquids, however penetrate is trace and does not remain in laryngeal vestibule. No deep penetration or aspiration was observed during this study even when patient instructed to take straw sips and large volume cup sips. Chin tuck posture with thin liquids was observed during first sip of thin liquids but did not appear to make significant improvements. Patient exhibited minimal vallecular residuals of puree and regular solids post initial swallow, and trace to minimal vallecular, pyriform and lateral channel residuals with thin liquids. Residuals would clear out of pharynx with subsequent dry swallows. No penetration or aspiration observed from pharyngeal residuals. SLP is recommending upgrade of patient's liquids from nectar thick to thin but wait for bedside trials of dysphagia 3 solids before upgrading solid textures.   Swallow Evaluation Recommendations       SLP Diet Recommendations: Dysphagia 2 (Fine chop) solids;Thin liquid   Liquid Administration via: Cup;Straw   Medication Administration: Whole meds with liquid   Supervision: Patient able to self feed;Intermittent supervision to cue for compensatory strategies   Compensations: Slow rate;Small sips/bites;Minimize environmental distractions;Follow solids with liquid   Postural Changes: Seated upright at 90  degrees   Oral Care Recommendations: Oral care BID       Sonia Baller, MA, CCC-SLP Speech Therapy

## 2020-11-05 NOTE — Progress Notes (Signed)
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 Admitted to [facility] on 3/30 for encephalopathy following a fall with associated facial fractures. Patient was found by brother on 3/30 after he had been unable to reach patient for unknown period of time (1 to 3 days suspected). Patient fracture left frontal sinus, L3 transverse process, displaced angulated mildly comminuted left femoral neck fracture. AKI with rhabdomyolysis.   3/31 patient develop worsening hypoxia and respiratory distress, intubated.  4/2 extubated and transferred out of the ICU on 4/3. T 4/3--4/4 he developed increased work of breathing and increased oxygen needs requiring intubation. This was thought to be related to an aspiration event.  4/6 -  extubated 4/12 - questionable aspiration pneumonia, Unasyn initiated now completed Interim - worsening mental status, MRI of the brain on 4/16 shows newly diagnosed metastatic disease, transferred to [facility] for radiation oncology evaluation and steroids 4/18 - mental status improving, patient AOx4, somewhat sluggish but appropriate.  We had a lengthy discussion about goals of care, patient has not yet decided on goals of care, hospice, CODE STATUS as he is still dealing with trying to understand his new diagnosis and states he has not had a meaningful conversation with oncology about his prognosis and life expectancy.  **Interim History  On 10/30/20 the patient attempted to get out of bed without calling for staff to help and fell on the floor. Repeat Imaging done and unremarkable.  PT OT still recommending skilled nursing facility.  Patient mentation looks like it is improving.  Palliative care to have further goals of care discussion and patient's primary oncologist to call the patient and  discuss further options.  Patient wishes to be DNR now so his CODE STATUS was changed.  Palliative met with the patient and discussed his options about his feeding given his poor p.o. intake and patient wishes to hold off his NG tube today.  Patient discussed with his primary oncologist and he wishes to pursue aggressive treatment and the recommendation was for PEG tube placement so we will consult interventional radiology for placement.  Patient continues to want aggressive measures and wants to follow-up with Dr. Delton Coombes for aggressive chemotherapy.   Plan is for PEG tube placement as his CT scan showed that his anatomy was amenable to PEG tube placement however patient refused at this morning and wanted more time to think and wanted to see if his diet could be advanced prior to fully committing to a PEG tube placement.  SLP has been reconsulted and we will plan for another MBS to see if his diet can be advanced from a dysphagia 2 diet and see how he does in terms of aspiration risk and they have changed him to Thin Liquids now ans recommending Dysphagia 3 Testing tomorrow.  We have also ordered a calorie count for the patient for next 48 hours and consulted dietitian to see how much the patient is actually taking in.  Plan is to D/C to SNF but currently PEG is deferred for now as he wasnts to see if his diet can be advanced and if he is not meeting his oral nutrition adequately, PEG tube will need to be placed and patient understands this. Patient is very upset at his brother and requests not information be divulged to him. CSW consulted  to speak with patient at his request. Orthopedics re-evaulated and recommending WBAT on the LLE and recommending removing sutures of his left hip.   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 [facility])   Left displaced femoral neck fracture (HCC)   Closed fracture of transverse process of lumbar vertebra (HCC)   Pressure injury of skin    Closed fracture of left hip (HCC)   Protein-calorie malnutrition, severe   Status post total replacement of left hip   Non-traumatic rhabdomyolysis   Fall   Left-sided weakness   Acute respiratory failure with hypoxia (HCC)   Oropharyngeal dysphagia   Sacral pressure ulcer   Orthostatic hypotension   Primary malignant neoplasm of lung with metastasis to brain [facility])   Essential hypertension   Thrombocytopenia (HCC)   Tobacco abuse  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 Delton Coombes with oncology and Dr. Sondra Come with rads oncology  -Currently now at [facility] for ongoing palliative radiation -Dr. Ashok Pall neurosurgery does not believe patient currently needs shunt however states if radiation oncology requires shunt placement he is available for placement -Neurology recommends continuing radiation brain treatments and having the patient be discharged to the skilled nursing facility -C/w Supportive care  -Currently getting Radiation TX and will receive a total of 10 Tx total (started 10/28/20); will complete radiation therapy if he continues to pursue aggressive care and will need a PEG placement given his poor p.o. intake however he refused it this morning; see below -Patient spoke with his primary oncologist and has elected for PEG tube placement.  Interventional radiology has been consulted for assistance with placement and he underwent a CT scan yesterday which showed that his anatomy was amenable for PEG placement -PEG was to be placed today but patient refused this morning and wanted to see if he could tolerate advancing his diet and seeing how you do with a calorie count; PEG placed on hold and SLP consulted for further evaluation and recommendations and patient will undergo a repeat MBS today; dietitian consulted and he is on a 48-hour calorie count now.  Acute respiratory failure with hypoxia/Aspiration pneumonia/mucus plugging/likely history of  emphysema requiring intubation/PPV, Resolving - 4/5 Mucus Plug likely the cause of acute hypoxia - Patient previously requiring Ventimask when sleeping given mouth breathing with NG tube in -SpO2: 93 % O2 Flow Rate (L/min): 0 L/min FiO2 (%): 21 % -No longer on Supplemental O2 via [place] and his respiratory status is extremely stable  Hx Small cell lung cancer  -S/p chemotherapy Lurbinectedin last cycle 09/12/20. -Extubated 4\6 -Completed 7 days of cefepime 10/16/20. - Brovana/Yupelri nebulizer treatments and PRN albuterol.  -received Solumedrol on 4/05 during mucus plug event, dexamethasone ongoing given his brain metastasis -Completed 5-day course Unasyn 4/17 -Patient wants aggressive measures and initially had elected for PEG tube placement for optimizing nutrition in the setting of getting aggressive chemotherapy as an outpatient but currently refused it today and wanted to wait a little bit longer and see how he does with advancing his diet possibly and see how he truly does with a calorie count  Orthostatic Hypotension - Albumin given previously with minimal improvement -C/w TED hose 12 hours on/12 hours off - NS 50 ml/hr; Tube feeds w/ free water flushes were initiated but now stopped; patient has decided about PEG tube so we will reinitiate tube feedings -Further goals of care discussions being had and he wishes  to pursue aggressive measures -Repeat orthostatic vital signs prior to discharging and will need to be done in the AM   Essential HTN/Ventricular Tachycardia - The night before last reported patient had a couple runs of ventricular tachycardia in addition rise in BP.  -Most likely secondary to his multiple brain mets with hydrocephalus - Allow permissive HTN - Metoprolol 12.5 mg BID - Hydralazine PRN SBP> 170 or DBP> 110 -Last blood pressure was on the softer side of 115/52  Nausea/vomiting - 4/14 resolved   Diarrhea;  - Resolved after stopping laxative -tube feeds  discontinued for now but may need to be reinitiated; See below   Acute metabolic encephalopathy, resolving and improving Cannot rule out acute hospital delirium.  - Likely in the setting of metastatic disease, acute respiratory failure, aspiration pneumonia and ICU delirium. - Continue Gabapentin,dose was reduced to 100 mg 3 times daily from 300 mg. -Remains somewhat slow to answer but more appropriate, ANO x4 for the past 72 hours -Palliative Care consulted for [place] Discussion -Patient getting Radiation Tx and currently getting 10 scheduled radiation treatments; I spoke with case management about discharging to SNF and patient will need to have his nutrition addressed and had initially elected for PEG tube placement and this was to be done today but patient refused this AM wanted to see how he would do with SLP re-evaluation and Calorie Count first.   Left femoral Neck Fx S/P Hemiarthroplasty:  -PT following -Mechanical fall 10/30/2020, plain film unremarkable, orthopedic sidelined, no further imaging or intervention required -Continue to Monitor and C/w Pain Control   Left Facial Fx, L 3 TP Fx:  -Neurosurgery evaluated , no surgical intervention needed.  -Neurosurgery recommend ENT consult. -Spoke with ENT on called for [place] recommend Conservative management, Fracture is non displaced, does not need anything acute.   Rhabdomyolysis, POA -Resolved with IV fluids.   Thrombocytopenia, POA -Thrombocytopenia has now resolved and last Platelet Count was 225 -Continue to Monitor for S/Sx of Bleeding; No overt bleeding noted -Repeat CBC in the AM   Normocytic Anemia, likely chronic anemia of chronic disease - 4/6 transfused 1 unit PRBC -Patient's Hgb/Hct went from 8.7/27.4 -> 9.0/27.8 -> 9.3/29.2 -> 9.6/29.9 -> 10.0/31.1 -> 10.3/31.6 -> 9.8/30.1 -Continue to Monitor for S/Sx of Bleeding -No overt bleeding noted  GERD -C/w Famotidine 20 mg po BID   Tobacco Abuse -Smoking  Cessasstion counseling given  -C/w Nicotine Patch 14 mg TD Daily   Sacral pressure ulcer, POA Pressure Injury 10/11/20 Sacrum Medial Deep Tissue Pressure Injury - Purple or maroon localized area of discolored intact skin or blood-filled blister due to damage of underlying soft tissue from pressure and/or shear. (Active)  10/11/20 0220  Location: Sacrum  Location Orientation: Medial  Staging: Deep Tissue Pressure Injury - Purple or maroon localized area of discolored intact skin or blood-filled blister due to damage of underlying soft tissue from pressure and/or shear.  Wound Description (Comments):   Present on Admission: Yes     Pressure Injury 10/15/20 Heel Right Deep Tissue Pressure Injury - Purple or maroon localized area of discolored intact skin or blood-filled blister due to damage of underlying soft tissue from pressure and/or shear. (Active)  10/15/20 0323  Location: Heel  Location Orientation: Right  Staging: Deep Tissue Pressure Injury - Purple or maroon localized area of discolored intact skin or blood-filled blister due to damage of underlying soft tissue from pressure and/or shear.  Wound Description (Comments):   Present on Admission: No  Pressure Injury 10/27/20 Knee Anterior;Right;Lateral Deep Tissue Pressure Injury - Purple or maroon localized area of discolored intact skin or blood-filled blister due to damage of underlying soft tissue from pressure and/or shear. (Active)  10/27/20 1941  Location: Knee  Location Orientation: Anterior;Right;Lateral  Staging: Deep Tissue Pressure Injury - Purple or maroon localized area of discolored intact skin or blood-filled blister due to damage of underlying soft tissue from pressure and/or shear.  Wound Description (Comments):   Present on Admission: Yes   Severe protein calorie malnutrition -Dietitian consulted and evaluated ad recommending Changing TF to Jevity 1.5 @ 60 mL/hr x 14 hours a day with Prosource TF once/day and  150 mL Free Water q4h if NGT/PEG is placed -NG tube/core track incidentally removed yesterday morning after fall, will hold off on replacement and advance diet as tolerated. - Speech placed him on dysphagia 2 diet with thick liquid after getting a barium swallow, please see the full report-remains high risk for aspiration. - After discussion with his primary oncologist he wants to pursue aggressive measures and has elected for PEG tube placement for nutritional support but then this AM refused; See above -C/w Mirtazapine 30 mg po qHS for Appetite   GOALS OF CARE -Discussed with palliative care for advancement of hospice and end-of-life care discussion in the next 24 - 48h however his primary oncologist will call the patient this afternoon; brother Levada Dy was very understanding of the patient's current situation and prognosis but patient now does not want to disclose any information to his brother given what the patient's brother has been doing in the outpatient setting with the patient's finances.  -Dr. Avon Gully had a lengthy talk at bedside with the patient about his poor prognosis, ongoing palliative treatment and goals.  Patient now aware advanced stage of metastatic disease with grim prognosis but still wishes aggressive treatment -We did discuss discharge to his brother's house versus hospice house, he does not wish to live with his brother, palliative care to continue this conversation for possible hospice house admission once patient has completed inpatient therapies however his primary oncologist is advocating for SNF which he is agreeable to -CODE Status changed to DNR per patient's wishes after [place] discussion -Patient still wants to pursue full measures and aggressive chemotherapy but has elected to defer PEG placement today and wants to see how he does with diet advancement possibly an SLP reevaluation and 48-hour calorie count.  DVT prophylaxis: SCDS; Heparin 5,000 units sq q8h Code  Status: DO NOT RESUSCITATE  Family Communication: No family present at bedside and patient does not want any information disclosed to his brother. Disposition Plan: SNF if improved but continues to have Palliative Care Discussions and has elected to pursue aggressive measures  Status is: Inpatient  Remains inpatient appropriate because:Unsafe d/c plan, IV treatments appropriate due to intensity of illness or inability to take PO and Inpatient level of care appropriate due to severity of illness   Dispo:  Patient From:   Home  Planned Disposition: To be determined but likely SNF vs. Hospice depending on further palliative discussions  Medically stable for discharge:   NO    Consultants:   ENT  Neurosurgery  Palliative Care Medicine  Radiation Oncology  Medical Oncology consulted but his Primary Oncologist is at [facility]  Procedures/Significant Events:  3/30 - admitted to [facility] for encephalopathy. Facial fx, L3 TP fx, Hip fx.  3/31 non op from [place] standpoint. Ortho planned to OR 3/31 however acute hypoxic respiratory failure, transferred  to ICU and emergently intubated.  3/31 ETT >>4/1 3/31 bronchoscopy with BAL left lower lobe 4/1 LT Hip Hemiarthroplasty (Anterior Approach) 4/4 ETT>>4/6 4/12Unasyn for concern of aspiration -now completed 4/16 -MRI brain showing metastatic disease  Cultures 4/4 tracheal aspirate positive rare Candida Krusei  4/4 MRSA by PCR negative  Antimicrobials:  Anti-infectives (From admission, onward)   Start     Dose/Rate Route Frequency Ordered Stop   10/23/20 1330  Ampicillin-Sulbactam (UNASYN) 3 g in sodium chloride 0.9 % 100 mL IVPB        3 g 200 mL/hr over 30 Minutes Intravenous Every 6 hours 10/23/20 1241 10/28/20 2359   10/15/20 2200  ceFEPIme (MAXIPIME) 2 g in sodium chloride 0.9 % 100 mL IVPB        2 g 200 mL/hr over 30 Minutes Intravenous Every 12 hours 10/15/20 1121 10/16/20 2332   10/15/20 0400  vancomycin (VANCOREADY) IVPB  1500 mg/300 mL  Status:  Discontinued        1,500 mg 150 mL/hr over 120 Minutes Intravenous Every 24 hours 10/15/20 0300 10/15/20 1324   10/12/20 1254  vancomycin (VANCOCIN) powder  Status:  Discontinued          As needed 10/12/20 1255 10/12/20 1328   10/11/20 2100  vancomycin (VANCOREADY) IVPB 1250 mg/250 mL  Status:  Discontinued        1,250 mg 166.7 mL/hr over 90 Minutes Intravenous Every 24 hours 10/10/20 2104 10/12/20 0834   10/11/20 1800  ceFEPIme (MAXIPIME) 2 g in sodium chloride 0.9 % 100 mL IVPB  Status:  Discontinued        2 g 200 mL/hr over 30 Minutes Intravenous Every 8 hours 10/11/20 1230 10/15/20 1121   10/11/20 1200  ceFAZolin (ANCEF) IVPB 2g/100 mL premix        2 g 200 mL/hr over 30 Minutes Intravenous On call to O.R. 10/11/20 1114 10/12/20 0559   10/10/20 2100  vancomycin (VANCOREADY) IVPB 1250 mg/250 mL        1,250 mg 166.7 mL/hr over 90 Minutes Intravenous  Once 10/10/20 2058 10/10/20 2327   10/10/20 2100  ceFEPIme (MAXIPIME) 2 g in sodium chloride 0.9 % 100 mL IVPB  Status:  Discontinued        2 g 200 mL/hr over 30 Minutes Intravenous Every 12 hours 10/10/20 2058 10/11/20 1230        Subjective: Seen and examined at bedside and he is not wanting to get his PEG tube placed today.  Denies chest pain shortness breath lightheadedness or dizziness but with wanted to see if the speech therapist will come back again and see if the diet could be advanced.  We have consulted SLP now and also placed a calorie count for the patient.  He repeats again that he does not want any information discussed with his brother.  No other concerns or complaints at this time.  Objective: Vitals:   11/04/20 2052 11/05/20 0459 11/05/20 0815 11/05/20 1420  BP:  139/73  (!) 110/55  Pulse:  (!) 58  60  Resp:  (!) 21  15  Temp:  97.7 F (36.5 C)  97.7 F (36.5 C)  TempSrc:  Oral  Oral  SpO2: 97% 95% 95% 93%  Weight:      Height:        Intake/Output Summary (Last 24 hours) at  11/05/2020 1428 Last data filed at 11/05/2020 0507 Gross per 24 hour  Intake 440.06 ml  Output 1400 ml  Net -959.94  ml   Filed Weights   11/02/20 0500 11/03/20 0605 11/04/20 0422  Weight: 63.2 kg 60.3 kg 62.1 kg   Examination: Physical Exam:  Constitutional: Patient is a thin chronically ill-appearing Caucasian male currently no acute distress and refusing his PEG today. Eyes: Lids and conjunctivae normal, sclerae anicteric  ENMT: External Ears, Nose appear normal. Grossly normal hearing.  Neck: Appears normal, supple, no cervical masses, normal ROM, no appreciable thyromegaly; no JVD Respiratory: Diminished to auscultation bilaterally, no wheezing, rales, rhonchi or crackles. Normal respiratory effort and patient is not tachypenic. No accessory muscle use.  Unlabored breathing Cardiovascular: RRR, no murmurs / rubs / gallops. S1 and S2 auscultated.  Trace lower extremity abdomen: Soft, non-tender, non-distended. Bowel sounds positive.  GU: Deferred. Musculoskeletal: No clubbing / cyanosis of digits/nails. No joint deformity upper and lower extremities.  Skin: No rashes, lesions, ulcers on to skin evaluation. No induration; Warm and dry.  Neurologic: CN 2-12 grossly intact with no focal deficits. Romberg sign and cerebellar reflexes not assessed.  Psychiatric: Normal judgment and insight. Alert and oriented x 3. Normal mood and appropriate affect.   Data Reviewed: I have personally reviewed following labs and imaging studies  CBC: Recent Labs  Lab 11/01/20 0407 11/02/20 0351 11/03/20 0442 11/04/20 0304 11/05/20 0439  WBC 6.8 5.4 7.2 8.2 8.9  NEUTROABS 5.8 4.0 5.8 7.3 7.8*  HGB 9.3* 9.6* 10.0* 10.3* 9.8*  HCT 29.2* 29.9* 31.1* 31.6* 30.1*  MCV 98.6 97.4 99.7 98.1 98.0  PLT 304 279 275 272 185   Basic Metabolic Panel: Recent Labs  Lab 11/01/20 0407 11/02/20 0351 11/03/20 0442 11/04/20 0304 11/05/20 0439  NA 136 137 136 136 135  K 4.4 4.0 3.4* 4.1 4.3  CL 106 106 105  106 104  CO2 _0 GLUCOSE 123* 105* 139* 129* 134*  BUN 29* 33* 28* 30* 31*  CREATININE 0.74 0.74 0.70 0.70 0.75  CALCIUM 9.2 9.1 8.7* 8.8* 8.8*  MG 2.1 2.3 2.0 2.1 2.0  PHOS 4.2 3.6 2.8 3.0 3.0   GFR: Estimated Creatinine Clearance: 76.5 mL/min (by C-G formula based on SCr of 0.75 mg/dL). Liver Function Tests: Recent Labs  Lab 11/01/20 0407 11/02/20 0351 11/03/20 0442 11/04/20 0304 11/05/20 0439  AST 12* 9* 12* 12* 14*  ALT _1 ALKPHOS 65 67 73 78 72  BILITOT 0.5 0.4 <0.1* 0.5 0.5  PROT 5.6* 5.8* 5.7* 6.1* 5.8*  ALBUMIN 3.0* 3.1* 3.0* 3.3* 3.0*   No results for input(s): LIPASE, AMYLASE in the last 168 hours. No results for input(s): AMMONIA in the last 168 hours. Coagulation Profile: No results for input(s): INR, PROTIME in the last 168 hours. Cardiac Enzymes: No results for input(s): CKTOTAL, CKMB, CKMBINDEX, TROPONINI in the last 168 hours. BNP (last 3 results) No results for input(s): PROBNP in the last 8760 hours. HbA1C: No results for input(s): HGBA1C in the last 72 hours. CBG: Recent Labs  Lab 11/04/20 2048 11/05/20 0001 11/05/20 0455 11/05/20 0739 11/05/20 1146  GLUCAP 159* 155* 127* 119* 121*   Lipid Profile: No results for input(s): CHOL, HDL, LDLCALC, TRIG, CHOLHDL, LDLDIRECT in the last 72 hours. Thyroid Function Tests: No results for input(s): TSH, T4TOTAL, FREET4, T3FREE, THYROIDAB in the last 72 hours. Anemia Panel: No results for input(s): VITAMINB12, FOLATE, FERRITIN, TIBC, IRON, RETICCTPCT in the last 72 hours. Sepsis Labs: No results for input(s): PROCALCITON, LATICACIDVEN in the last 168 hours.  No results found for this or any  previous visit (from the past 240 hour(s)).   RN Pressure Injury Documentation: Pressure Injury 10/11/20 Sacrum Medial Deep Tissue Pressure Injury - Purple or maroon localized area of discolored intact skin or blood-filled blister due to damage of underlying soft tissue from pressure and/or  shear. (Active)  10/11/20 0220  Location: Sacrum  Location Orientation: Medial  Staging: Deep Tissue Pressure Injury - Purple or maroon localized area of discolored intact skin or blood-filled blister due to damage of underlying soft tissue from pressure and/or shear.  Wound Description (Comments):   Present on Admission: Yes     Pressure Injury 10/15/20 Heel Right Deep Tissue Pressure Injury - Purple or maroon localized area of discolored intact skin or blood-filled blister due to damage of underlying soft tissue from pressure and/or shear. (Active)  10/15/20 0323  Location: Heel  Location Orientation: Right  Staging: Deep Tissue Pressure Injury - Purple or maroon localized area of discolored intact skin or blood-filled blister due to damage of underlying soft tissue from pressure and/or shear.  Wound Description (Comments):   Present on Admission: No     Pressure Injury 10/27/20 Knee Anterior;Right;Lateral Deep Tissue Pressure Injury - Purple or maroon localized area of discolored intact skin or blood-filled blister due to damage of underlying soft tissue from pressure and/or shear. (Active)  10/27/20 1941  Location: Knee  Location Orientation: Anterior;Right;Lateral  Staging: Deep Tissue Pressure Injury - Purple or maroon localized area of discolored intact skin or blood-filled blister due to damage of underlying soft tissue from pressure and/or shear.  Wound Description (Comments):   Present on Admission: Yes   Estimated body mass index is 20.23 kg/m as calculated from the following:   Height as of this encounter: _0  (1.753 m).   Weight as of this encounter: 62.1 kg.  Malnutrition Type:  Nutrition Problem: Severe Malnutrition Etiology: chronic illness (small cell carcinoma of the lung)  Malnutrition Characteristics:  Signs/Symptoms: severe muscle depletion,severe fat depletion  Nutrition Interventions:  Interventions: Magic cup,Ensure Enlive (each supplement provides  350kcal and 20 grams of protein),Boost Breeze,MVI   Radiology Studies: No results found. Scheduled Meds: . arformoterol  15 mcg Nebulization BID  . [START ON 11/06/2020] aspirin  81 mg Oral Daily  . chlorhexidine  15 mL Mouth Rinse BID  . Chlorhexidine Gluconate Cloth  6 each Topical Daily  . dexamethasone (DECADRON) injection  4 mg Intravenous Q6H  . diphenoxylate-atropine  5 mL Oral Daily  . doxazosin  2 mg Oral Daily  . famotidine  20 mg Oral BID  . feeding supplement  1 Container Oral Q24H  . feeding supplement  237 mL Oral BID BM  . gabapentin  100 mg Oral Q8H  . heparin injection (subcutaneous)  5,000 Units Subcutaneous Q8H  . insulin aspart  0-9 Units Subcutaneous Q4H  . mouth rinse  15 mL Mouth Rinse q12n4p  . mouth rinse  15 mL Mouth Rinse q12n4p  . melatonin  5 mg Oral QHS  . metoprolol tartrate  12.5 mg Oral BID  . mirtazapine  30 mg Oral QHS  . nicotine  14 mg Transdermal Daily  . revefenacin  175 mcg Nebulization Daily  . sodium chloride flush  10-40 mL Intracatheter Q12H  . sodium chloride flush  10-40 mL Intracatheter Q12H   Continuous Infusions: . sodium chloride Stopped (10/25/20 0829)  . sodium chloride 50 mL/hr at 11/04/20 0523    LOS: 26 days   Kerney Elbe, DO [facility] PAGER is on [place]  If  7PM-7AM, please contact night-coverage [email or web address]

## 2020-11-05 NOTE — Care Management Important Message (Signed)
Important Message  Patient Details IM Letter given to the Patient. Name: [name] MRN: [id number] Date of Birth: 1950-09-30   Medicare Important Message Given:  Yes     Kerin Salen 11/05/2020, 11:30 AM

## 2020-11-06 ENCOUNTER — Ambulatory Visit
Admit: 2020-11-06 | Discharge: 2020-11-06 | Disposition: A | Discharge status: Home / Self Care | Payer: Medicare HMO | Attending: Radiation Oncology | Admitting: Radiation Oncology

## 2020-11-06 ENCOUNTER — Telehealth (HOSPITAL_COMMUNITY): Payer: Self-pay | Admitting: Surgery

## 2020-11-06 LAB — CBC WITH DIFFERENTIAL/PLATELET
Abs Immature Granulocytes: 0.33 10*3/uL — ABNORMAL HIGH (ref 0.00–0.07)
Basophils Absolute: 0 10*3/uL (ref 0.0–0.1)
Basophils Relative: 0 %
Eosinophils Absolute: 0 10*3/uL (ref 0.0–0.5)
Eosinophils Relative: 0 %
HCT: 29.7 % — ABNORMAL LOW (ref 39.0–52.0)
Hemoglobin: 9.4 g/dL — ABNORMAL LOW (ref 13.0–17.0)
Immature Granulocytes: 4 %
Lymphocytes Relative: 4 %
Lymphs Abs: 0.4 10*3/uL — ABNORMAL LOW (ref 0.7–4.0)
MCH: 31.5 pg (ref 26.0–34.0)
MCHC: 31.6 g/dL (ref 30.0–36.0)
MCV: 99.7 fL (ref 80.0–100.0)
Monocytes Absolute: 0.4 10*3/uL (ref 0.1–1.0)
Monocytes Relative: 5 %
Neutro Abs: 7.2 10*3/uL (ref 1.7–7.7)
Neutrophils Relative %: 87 %
Platelets: 208 10*3/uL (ref 150–400)
RBC: 2.98 MIL/uL — ABNORMAL LOW (ref 4.22–5.81)
RDW: 20.6 % — ABNORMAL HIGH (ref 11.5–15.5)
WBC: 8.3 10*3/uL (ref 4.0–10.5)
nRBC: 0 % (ref 0.0–0.2)

## 2020-11-06 LAB — COMPREHENSIVE METABOLIC PANEL
ALT: 17 U/L (ref 0–44)
AST: 12 U/L — ABNORMAL LOW (ref 15–41)
Albumin: 3 g/dL — ABNORMAL LOW (ref 3.5–5.0)
Alkaline Phosphatase: 61 U/L (ref 38–126)
Anion gap: 6 (ref 5–15)
BUN: 31 mg/dL — ABNORMAL HIGH (ref 8–23)
CO2: 26 mmol/L (ref 22–32)
Calcium: 8.5 mg/dL — ABNORMAL LOW (ref 8.9–10.3)
Chloride: 104 mmol/L (ref 98–111)
Creatinine, Ser: 0.75 mg/dL (ref 0.61–1.24)
GFR, Estimated: >60 mL/min (ref >60)
Glucose, Bld: 125 mg/dL — ABNORMAL HIGH (ref 70–99)
Potassium: 4.2 mmol/L (ref 3.5–5.1)
Sodium: 136 mmol/L (ref 135–145)
Total Bilirubin: 0.4 mg/dL (ref 0.3–1.2)
Total Protein: 5.4 g/dL — ABNORMAL LOW (ref 6.5–8.1)

## 2020-11-06 LAB — MAGNESIUM: Magnesium: 2.1 mg/dL (ref 1.7–2.4)

## 2020-11-06 LAB — GLUCOSE, CAPILLARY
Glucose-Capillary: 124 mg/dL — ABNORMAL HIGH (ref 70–99)
Glucose-Capillary: 125 mg/dL — ABNORMAL HIGH (ref 70–99)
Glucose-Capillary: 139 mg/dL — ABNORMAL HIGH (ref 70–99)
Glucose-Capillary: 153 mg/dL — ABNORMAL HIGH (ref 70–99)
Glucose-Capillary: 155 mg/dL — ABNORMAL HIGH (ref 70–99)
Glucose-Capillary: 187 mg/dL — ABNORMAL HIGH (ref 70–99)

## 2020-11-06 LAB — PHOSPHORUS: Phosphorus: 3.5 mg/dL (ref 2.5–4.6)

## 2020-11-06 MED ORDER — PROSOURCE PLUS PO LIQD
30.0000 mL | Freq: Two times a day (BID) | ORAL | Status: DC
  Administered 2020-11-06 – 2020-11-10 (×8): 30 mL via ORAL
  Filled 2020-11-06 (×8): qty 30

## 2020-11-06 NOTE — Telephone Encounter (Signed)
Pt had called yesterday and left a voicemail asking if Dr. Delton Coombes approved of him having a PEG tube placed.  This message was given to Dr. Delton Coombes and he called and left the pt a voicemail.  I also called the pt today on the number listed in his voicemail and left a message stating that Dr. Delton Coombes approved of the PEG  Tube and to call our office if he had any more concerns.

## 2020-11-06 NOTE — Progress Notes (Signed)
Daily Progress Note   Patient Name: [name]       Date: 11/06/2020 DOB: 1951-05-02  Age: 70 y.o. MRN#: [id number] Attending Physician: David Elbe, DO Primary Care Physician: David Frizzle, MD Admit Date: 10/10/2020  Reason for Consultation/Follow-up: Establishing goals of care  Subjective: I saw and examined [name] as he was getting ready to go downstairs for radiation today.  He expressed today that he really does not want to get a PEG tube.  We reviewed plan to complete swallow study with hopes that we can liberalize his diet and then follow to see how much intake he has of the next 48 hours.  Discussed with bedside RN as well as speech-language pathologist and will await recommendations of SLP following swallow study (hopefully later today).  Length of Stay: 27  Current Medications: Scheduled Meds:  . arformoterol  15 mcg Nebulization BID  . aspirin  81 mg Oral Daily  . chlorhexidine  15 mL Mouth Rinse BID  . Chlorhexidine Gluconate Cloth  6 each Topical Daily  . dexamethasone (DECADRON) injection  4 mg Intravenous Q6H  . diphenoxylate-atropine  5 mL Oral Daily  . doxazosin  2 mg Oral Daily  . famotidine  20 mg Oral BID  . feeding supplement  1 Container Oral Q24H  . feeding supplement  237 mL Oral BID BM  . gabapentin  100 mg Oral Q8H  . heparin injection (subcutaneous)  5,000 Units Subcutaneous Q8H  . insulin aspart  0-9 Units Subcutaneous Q4H  . mouth rinse  15 mL Mouth Rinse q12n4p  . melatonin  5 mg Oral QHS  . metoprolol tartrate  12.5 mg Oral BID  . mirtazapine  30 mg Oral QHS  . nicotine  14 mg Transdermal Daily  . revefenacin  175 mcg Nebulization Daily  . sodium chloride flush  10-40 mL Intracatheter Q12H  . sodium chloride flush  10-40 mL  Intracatheter Q12H    Continuous Infusions: . sodium chloride Stopped (10/25/20 0829)  . sodium chloride 50 mL/hr at 11/05/20 2129    PRN Meds: sodium chloride, acetaminophen **OR** acetaminophen, albuterol, ALPRAZolam, hydrALAZINE, ondansetron (ZOFRAN) IV, polyethylene glycol, Resource ThickenUp Clear, sodium chloride flush, sodium chloride flush, sodium chloride flush  Physical Exam         Awake alert Is thin  and has muscle wasting Regular work of breathing S 1 S 2  Abdomen not distended No edema  Vital Signs: BP 129/62 (BP Location: Left Arm)   Pulse 63   Temp 98.2 F (36.8 C) (Oral)   Resp 16   Ht 5\' 9"  (1.753 m)   Wt 62.1 kg   SpO2 95%   BMI 20.23 kg/m  SpO2: SpO2: 95 % O2 Device: O2 Device: Room Air O2 Flow Rate: O2 Flow Rate (L/min): 0 L/min  Intake/output summary:   Intake/Output Summary (Last 24 hours) at 11/06/2020 0557 Last data filed at 11/05/2020 2123 Gross per 24 hour  Intake 1553.42 ml  Output 1400 ml  Net 153.42 ml   LBM: Last BM Date: 11/05/20 Baseline Weight: Weight: 66.7 kg Most recent weight: Weight: 62.1 kg       Palliative Assessment/Data: PPS: 50%    Flowsheet Rows   Flowsheet Row Most Recent Value  Intake Tab   Referral Department Hospitalist  Unit at Time of Referral Intermediate Care Unit  Date Notified 10/20/20  Palliative Care Type New Palliative care  Reason for referral Clarify Goals of Care  Date of Admission 10/10/20  Date first seen by Palliative Care 10/21/20  # of days Palliative referral response time 1 Day(s)  # of days IP prior to Palliative referral 10  Clinical Assessment   Psychosocial & Spiritual Assessment   Palliative Care Outcomes       Patient Active Problem List   Diagnosis Date Noted  . Primary malignant neoplasm of lung with metastasis to brain ([place]) 10/27/2020  . Essential hypertension 10/27/2020  . Thrombocytopenia ([place]) 10/27/2020  . Tobacco abuse 10/27/2020  . Sacral pressure ulcer  10/25/2020  . Orthostatic hypotension 10/25/2020  . Oropharyngeal dysphagia   . Status post total replacement of left hip   . Non-traumatic rhabdomyolysis   . Fall   . Left-sided weakness   . Acute respiratory failure with hypoxia ([place])   . Protein-calorie malnutrition, severe 10/16/2020  . Closed fracture of left hip ([place])   . Pressure injury of skin 10/11/2020  . Sepsis without septic shock ([place])   . Left displaced femoral neck fracture ([place])   . Delirium   . Closed fracture of frontal sinus ([place])   . Closed fracture of transverse process of lumbar vertebra ([place])   . Goals of care, counseling/discussion 12/19/2019  . Iron deficiency anemia 09/12/2019  . 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 Lymphadenopathy of head and neck 01/22/2017  . Osteopenia 03/30/2015  . Clubbing of fingers 04/14/2014  . Hyperglycemia 10/20/2013  . Smoker   . Closed dislocation of acromioclavicular joint 04/11/2010  . BREAST MASS, LEFT 01/20/2008  . ONYCHOMYCOSIS, TOENAILS 10/20/2007  . MUSCLE CRAMPS 10/20/2007  . Hypercholesterolemia 05/28/2007  . CATARACT NOS 08/28/2006  . DISTURBANCE, VISUAL NOS 08/19/2006  . COPD (chronic obstructive pulmonary disease) with emphysema ([place]) 08/19/2006  . WITHDRAWAL, DRUG 06/24/2006  . ANXIETY 06/24/2006  . DEPRESSION 06/24/2006  . MYOCARDIAL INFARCTION, HX OF 06/24/2006  . Coronary atherosclerosis 06/24/2006  . CARDIAC ARRHYTHMIA 06/24/2006  . CONSTIPATION 06/24/2006  . OSTEOARTHRITIS 06/24/2006  . LOW BACK PAIN 06/24/2006  . 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 He remains confused. Has significant dysphagia and is currently NPO with coretrak in place. Palliative medicine consulted for goals of care for this very ill man with multiple comorbidities who is at high risk of decompensation and dying.  Assessment/Recommendations/Plan -Discussed with [name], [name], [name], and bedside RN.  I am still not sure how much Mr. Kleinsasser understands fully about his condition.  He is invested in plan to continue with aggressive interventions, but he remains very frail and with limited nutrition.  He had previously stated that he wanted to get a PEG tube but tells me today he is hoping not to do so.  We discussed plan to have swallow study today, hope to liberalize diet somewhat, and then to follow intake over the next 48 hours and make a final decision. -We will ask another member of palliative medicine team to follow-up over the next couple of days  Goals of Care and Additional Recommendations: Limitations on Scope of Treatment: Full Scope Treatment  Code Status: DNR  Prognosis:  guarded.   Discharge Planning: To Be Determined  Care plan was discussed with patient.   Thank you for allowing the Palliative Medicine Team to assist in the care of this patient.   Total time: 25 minutes Greater than 50%  of this time was spent counseling and coordinating care related to the above assessment and plan.   Micheline Rough, MD [place] Team [phone]  Please contact Palliative Medicine Team phone at [phone] for questions and concerns.

## 2020-11-06 NOTE — Plan of Care (Signed)
  Problem: Education: Goal: Individualized Educational Video(s) Outcome: Progressing   Problem: Activity: Goal: Ability to ambulate and perform ADLs will improve Outcome: Progressing

## 2020-11-06 NOTE — Progress Notes (Addendum)
Calorie Count: DAY 1  Breakfast: 2 Sausage patties chopped 100%                     1 cup Eggs 100%                    1 bowl grits 100%,          1/2 cup  fruit cup 100%                       OJ 100%  SNACK: Ensure Vanilla--100% 283ml                 Resource Berry-100% 26ml  Lunch:  1 serving of roasted beef w/gravy--80%               2 cups Marcaroni and cheese (pt received extra tray)               1/2 cup peas               1 and 1/2 angel food cake with cool whip topping               2-3 bites of broccilli (steamed)    1 cup tea               1 cup milk  SNACK: 15 gram Prosource                  Dinner: PM RM to update.

## 2020-11-06 NOTE — Progress Notes (Signed)
PT Cancellation Note  Patient Details Name: [name] MRN: [id number] DOB: 10-Jul-1951   Cancelled Treatment:    Reason Eval/Treat Not Completed: Fatigue/lethargy limiting ability to participate , OT worked with patient , then was give meds to rest per patient request. Will check back another time.  Claretha Cooper 11/06/2020, 2:00 PM  [place] Pager [phone] Office [phone]

## 2020-11-06 NOTE — Progress Notes (Signed)
Occupational Therapy Treatment Patient Details Name: [name] MRN: [id number] DOB: March 02, 1951 Today's Date: 11/06/2020    History of present illness Pt is 70 yo admitted 3/30 after fall and found by brother with unknown down time (1-3 days). Fractured L frontal sinus,L L3 transverse process, displaced angulated mildly comminuted L femoral neck fx. AKI with rhabdo. 3/31 pt with hypoxia requiring intubation, extubated 4/1. Pt s/p left anterior hip hemiarthroplasty 4/1. Re-intubated 4/4 for hypoxemia and transfered to ICU, extubated 10/17/20. Progress limited by orthostasis. 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 Patient limited by constant dizziness when not in supine, generalized weakness, impaired activity tolerance and poor sitting and standing balance, along with deficits noted below. Pt continues to demonstrate fair rehab potential and would benefit from continued skilled OT to increase safety and independence with ADLs and functional transfers to allow pt to return home safely and reduce caregiver burden and fall risk.   Follow Up Recommendations  SNF    Equipment Recommendations  None recommended by OT    Recommendations for Other Services      Precautions / Restrictions Precautions Precautions: Fall Precaution Comments: constant dizziness Restrictions LLE Weight Bearing: Weight bearing as tolerated Other Position/Activity Restrictions: s/p L hemi  arthroplasty, anterior approach, no precautions       Mobility Bed Mobility Overal bed mobility: Needs Assistance Bed Mobility: Supine to Sit     Supine to sit: HOB elevated;Min assist Sit to supine: Supervision;HOB elevated   General bed mobility comments: Increased time/effort for bed mobility with use of bed rails as needed.    Transfers Overall transfer level: Needs assistance Equipment used: Rolling walker (2 wheeled) Transfers: Sit to/from Stand Sit to Stand: Min assist        Lateral/Scoot Transfers: Min guard (scooting along EOB)      Balance Overall balance assessment: Needs assistance Sitting-balance support: Single extremity supported Sitting balance-Leahy Scale: Poor Sitting balance - Comments: Losses of balance to RT side x 3 while dynamic sitting at EOB during exercises. Cathces self with forearm on raised HOB. Postural control: Right lateral lean   Standing balance-Leahy Scale: Poor Standing balance comment: reliant on UE support and min A for safety due to dizziness             High level balance activites: Side stepping High Level Balance Comments: Pt took 2 side steps to RT with RW and Mod As of 1 person to negotiate walker and for balance.           ADL either performed or assessed with clinical judgement   ADL Overall ADL's : Needs assistance/impaired Eating/Feeding: Set up;Bed level     Grooming Details (indicate cue type and reason): Pt declined to wash face or perform oral care despite education on importance of oral hygiene. Pt reports "they wash my face". Pt encouraged to go as many ADLs for himself as possible when recieving care. Given example to try washing own face and upper body as pt's UE ROM  and MMT are [facility]. Pt agreed to this.                   Toilet Transfer Details (indicate cue type and reason): Pt found to be soiled once sitting EOB.  Pt performed sit to stand from EOB with Min As. Please also see Mobility and  Balance sections. Toileting- Clothing Manipulation and Hygiene: Total assistance;Sit to/from stand Toileting - Clothing Manipulation Details (indicate cue type and reason): Pt incontinent of stools. Stated that he thought he had only passed gas. Needed cues to attend to hygiene as pt distracted by other wants/needs.  Total Assist needed for peri hygiene with pt standing with BUE supoprt on RW.     Functional mobility during ADLs: Minimal assistance;Cueing for safety;Cueing for sequencing;Rolling walker       Vision       Perception     Praxis      Cognition Arousal/Alertness: Awake/alert Behavior During Therapy: WFL for tasks assessed/performed Overall Cognitive Status: No family/caregiver present to determine baseline cognitive functioning Area of Impairment: Memory;Following commands;Safety/judgement;Problem solving                   Current Attention Level: Sustained Memory: Decreased short-term memory Following Commands: Follows one step commands consistently;Follows multi-step commands inconsistently Safety/Judgement: Decreased awareness of safety   Problem Solving: Slow processing;Requires verbal cues          Exercises Other Exercises Other Exercises: Sitting EOB, pt completed 10 reps of 3 towel on tabletop BUE exercises with cues for sitting balance and engaging core mm.   Shoulder Instructions       General Comments Reports dizziness with EOB.  BP supine: 125/60, HR: 75.  BP EOB: 137/61, HR 80.  Pt reports that dizziness remains constant.    Pertinent Vitals/ Pain       Pain Assessment: Faces Faces Pain Scale: Hurts a little bit Pain Location: left hip - with mobility. Pain Descriptors / Indicators: Grimacing;Guarding Pain Intervention(s): Monitored during session;Limited activity within patient's tolerance;Repositioned  Home Living                                          Prior Functioning/Environment               Frequency  Min 2X/week        Progress Toward Goals  OT Goals(current goals can now be found in the care plan section)  Progress towards OT goals: Progressing toward goals  Acute Rehab OT Goals Patient Stated Goal: to progress to a regular diet. OT Goal Formulation: With patient Time For Goal Achievement: 11/14/20 Potential to Achieve Goals: [place] Discharge plan remains appropriate    Co-evaluation                 AM-PAC OT "6 Clicks" Daily Activity     Outcome Measure   Help from another person eating meals?: A Little Help from another person taking care of personal grooming?: A Little Help from another person toileting, which includes using toliet, bedpan, or urinal?: Total Help from another person bathing (including washing, rinsing, drying)?: A Lot Help from another person to put on and taking off regular upper body clothing?: A Little Help from another person to put on and taking off regular lower body clothing?: Total 6 Click Score: 13    End of Session Equipment Utilized During  Treatment: Gait belt;Rolling walker  OT Visit Diagnosis: Unsteadiness on feet (R26.81);Cognitive communication deficit (R41.841);Pain Symptoms and signs involving cognitive functions: Cerebral infarction Pain - Right/Left: Left Pain - part of body: Leg   Activity Tolerance Patient tolerated treatment well   Patient Left in bed;with bed alarm set;with call bell/phone within reach;with nursing/sitter in room   Nurse Communication Mobility status        Time: 7510-2585 OT Time Calculation (min): 38 min  Charges: OT General Charges $OT Visit: 1 Visit OT Treatments $Self Care/Home Management : 8-22 mins $Therapeutic Activity: 8-22 mins $Therapeutic Exercise: 8-22 mins  Anderson Malta, OT Acute Rehab Services Office: [phone] 11/06/2020   Julien Girt 11/06/2020, 10:34 AM

## 2020-11-06 NOTE — TOC Progression Note (Signed)
Transition of Care [facility]) - Progression Note    Patient Details  Name: [name] MRN: [id number] Date of Birth: April 17, 1951  [facility] CM/SW Contact  Purcell Mouton, RN Phone Number: 11/06/2020, 4:23 PM  Clinical Narrative:    Pt not ready for discharge, not eating and may need a PEG tube placed. Pt states, "Do not call my brother [name]. Don't talk to him."   Expected Discharge Plan: [place] Barriers to Discharge: Continued Medical Work up (Cortrak and tube feedings in place.)  Expected Discharge Plan and Services Expected Discharge Plan: [place] In-house Referral: Clinical Social Work Discharge Planning Services: CM Consult Post Acute Care Choice: [place] arrangements for the past 2 months: Single Family Home                                       Social Determinants of Health (SDOH) Interventions    Readmission Risk Interventions No flowsheet data found.

## 2020-11-06 NOTE — Progress Notes (Signed)
Nutrition Follow-up  DOCUMENTATION CODES:  Severe malnutrition in context of chronic illness  INTERVENTION:  Continue to advance diet per SLP.  Continue Boost Breeze daily, Magic Cup TID, and Ensure Enlive BID.  Add 30 ml ProSource Plus po BID, each supplement provides 100 kcal and 15 grams of protein.   Add [employer] Breakfast po TID, each supplement provides 140 kcal and 5 grams of protein.  NUTRITION DIAGNOSIS:  Severe Malnutrition related to chronic illness (small cell carcinoma of the lung) as evidenced by severe muscle depletion,severe fat depletion. - ongoing  GOAL:  Patient will meet greater than or equal to 90% of their needs - almost meeting past 24 hrs, not prior  MONITOR:  PO intake,Supplement acceptance,Diet advancement,Labs,Weight trends  REASON FOR ASSESSMENT:  Consult Assessment of nutrition requirement/status,Diet education,Calorie Count  ASSESSMENT:  70 year old male who presented to the ED on 3/30 with AMS after a fall. Pt with unknown time down (1-3 days suspected). 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 Pt found to have left hip fracture, left L3 transverse process fracture, left frontal sinus fracture as well as rhabdomyolysis.  Significant Events: 3/31- admission; RR; intubation 4/1- initial RD assessment; L total hip hemiarthroplasty; wound vac placed to L hip extubated 4/4- RR; re-intubation; TF initiation 4/6- Cortrak placed; extubated 4/8- patient pulled wound vac off 4/11 - s/p MBSS, dysphagia 2 diet with honey-thick liquids  4/14 - diet advanced to dysphagia 2 with nectar-thick liquids 4/15- 48 hour Calorie Count ordered; TF regimen changed from 24 hours/day to 12 hours/day (1800-0600) 4/16- transferred from [facility] to [place] 4/19- Cortrak removed 4/21 - made DNR 4/25 - diet advanced by SLP to Dys 2 with thins  Per  palliative note from yesterday, pt really hopes he does not have to get a PEG tube.   With diet advancement yesterday, pt eating 75-100% of meals. Prior, pt eating 0-50%, usually 10%. Diet advancement has seemed to have aided in increased intake at least initially.   Spoke with pt at bedside. Pt in good spirits, says he is eating everything he can. RN reported he ate 100% of breakfast this morning. Pt also taking Ensure and Boost Breeze without any tolerance issues.   He reports that he is eager to continue transition. RN said she will contact SLP to see if diet can advance further today.  Given pt's eagerness to continue to eat well, recommend continuing Magic Cup TID, Boost Breeze daily, and Ensure BID. Pt interested in trying ProSource Plus BID and [employer] Instant Breakfast with whole milk TID.  Pt's weight remains fairly stable with some slight fluctuations most days - no edema present at this time.  Medications: dexamethasone, Pepcid, SSI, Remeron, IVF @ 50 ml/hr, Xanax Labs: reviewed; CBG 124-135  Diet Order:   Diet Order            DIET DYS 3 Room service appropriate? Yes; Fluid consistency: Thin  Diet effective now                EDUCATION NEEDS:  Education needs have been addressed  Skin:  Skin Assessment: Skin Integrity Issues: Skin Integrity Issues:: DTI,Incisions DTI: sacrum; R heel; R knee (newly documented 4/16) Stage I: - Incisions: L hip (4/1)  Last BM:  11/05/20  Height:  Ht Readings from Last 1 Encounters:  10/11/20 5\' 9"  (1.753 m)   Weight:  Wt Readings from Last 1 Encounters:  11/04/20 62.1 kg   Ideal Body Weight:  72.7 kg  BMI:  Body mass index is 20.23 kg/m.  Estimated Nutritional Needs:  Kcal:  1800-2000 Protein:  85-100 grams Fluid:  >/= 1.8 L  Derrel Nip, RD, LDN Registered Dietitian After Hours/Weekend Pager # in [place]

## 2020-11-06 NOTE — Progress Notes (Signed)
Daily Progress Note   Patient Name: [name]       Date: 11/06/2020 DOB: July 13, 1951  Age: 70 y.o. MRN#: [id number] Attending Physician: Kerney Elbe, DO Primary Care Physician: [name], MD Admit Date: 10/10/2020  Reason for Consultation/Follow-up: Establishing goals of care  Subjective: Awake alert resting in bed, on dysphagia 2 diet and ate 100% of his breakfast tray, asking for more solid consistency foods, not really in favor of having a PEG tube placed.  Repeat SLP evaluation to be requested by RN to see if the patient can be on regular consistency.  Length of Stay: 27  Current Medications: Scheduled Meds:  . arformoterol  15 mcg Nebulization BID  . aspirin  81 mg Oral Daily  . chlorhexidine  15 mL Mouth Rinse BID  . Chlorhexidine Gluconate Cloth  6 each Topical Daily  . dexamethasone (DECADRON) injection  4 mg Intravenous Q6H  . diphenoxylate-atropine  5 mL Oral Daily  . doxazosin  2 mg Oral Daily  . famotidine  20 mg Oral BID  . feeding supplement  1 Container Oral Q24H  . feeding supplement  237 mL Oral BID BM  . gabapentin  100 mg Oral Q8H  . heparin injection (subcutaneous)  5,000 Units Subcutaneous Q8H  . insulin aspart  0-9 Units Subcutaneous Q4H  . mouth rinse  15 mL Mouth Rinse q12n4p  . melatonin  5 mg Oral QHS  . metoprolol tartrate  12.5 mg Oral BID  . mirtazapine  30 mg Oral QHS  . nicotine  14 mg Transdermal Daily  . revefenacin  175 mcg Nebulization Daily  . sodium chloride flush  10-40 mL Intracatheter Q12H  . sodium chloride flush  10-40 mL Intracatheter Q12H    Continuous Infusions: . sodium chloride Stopped (10/25/20 0829)  . sodium chloride 50 mL/hr at 11/05/20 2129    PRN Meds: sodium chloride, acetaminophen **OR**  acetaminophen, albuterol, ALPRAZolam, hydrALAZINE, ondansetron (ZOFRAN) IV, polyethylene glycol, Resource ThickenUp Clear, sodium chloride flush, sodium chloride flush, sodium chloride flush  Physical Exam         Awake alert Is thin and has muscle wasting Regular work of breathing S 1 S 2  Abdomen not distended No edema  Vital Signs: BP 129/62 (BP Location: Left Arm)   Pulse 63   Temp  98.2 F (36.8 C) (Oral)   Resp 16   Ht 5\' 9"  (1.753 m)   Wt 62.1 kg   SpO2 95%   BMI 20.23 kg/m  SpO2: SpO2: 95 % O2 Device: O2 Device: Room Air O2 Flow Rate: O2 Flow Rate (L/min): 0 L/min  Intake/output summary:   Intake/Output Summary (Last 24 hours) at 11/06/2020 1145 Last data filed at 11/06/2020 1100 Gross per 24 hour  Intake 1793.42 ml  Output 2775 ml  Net -981.58 ml   LBM: Last BM Date: 11/05/20 Baseline Weight: Weight: 66.7 kg Most recent weight: Weight: 62.1 kg       Palliative Assessment/Data: PPS: 50%    Flowsheet Rows   Flowsheet Row Most Recent Value  Intake Tab   Referral Department Hospitalist  Unit at Time of Referral Intermediate Care Unit  Date Notified 10/20/20  Palliative Care Type New Palliative care  Reason for referral Clarify Goals of Care  Date of Admission 10/10/20  Date first seen by Palliative Care 10/21/20  # of days Palliative referral response time 1 Day(s)  # of days IP prior to Palliative referral 10  Clinical Assessment   Psychosocial & Spiritual Assessment   Palliative Care Outcomes       Patient Active Problem List   Diagnosis Date Noted  . Primary malignant neoplasm of lung with metastasis to brain ([place]) 10/27/2020  . Essential hypertension 10/27/2020  . Thrombocytopenia ([place]) 10/27/2020  . Tobacco abuse 10/27/2020  . Sacral pressure ulcer 10/25/2020  . Orthostatic hypotension 10/25/2020  . Oropharyngeal dysphagia   . Status post total replacement of left hip   . Non-traumatic rhabdomyolysis   . Fall   . Left-sided weakness   .  Acute respiratory failure with hypoxia ([place])   . Protein-calorie malnutrition, severe 10/16/2020  . Closed fracture of left hip ([place])   . Pressure injury of skin 10/11/2020  . Sepsis without septic shock ([place])   . Left displaced femoral neck fracture ([place])   . Delirium   . Closed fracture of frontal sinus ([place])   . Closed fracture of transverse process of lumbar vertebra ([place])   . Goals of care, counseling/discussion 12/19/2019  . Iron deficiency anemia 09/12/2019  . 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 Lymphadenopathy of head and neck 01/22/2017  . Osteopenia 03/30/2015  . Clubbing of fingers 04/14/2014  . Hyperglycemia 10/20/2013  . Smoker   . Closed dislocation of acromioclavicular joint 04/11/2010  . BREAST MASS, LEFT 01/20/2008  . ONYCHOMYCOSIS, TOENAILS 10/20/2007  . MUSCLE CRAMPS 10/20/2007  . Hypercholesterolemia 05/28/2007  . CATARACT NOS 08/28/2006  . DISTURBANCE, VISUAL NOS 08/19/2006  . COPD (chronic obstructive pulmonary disease) with emphysema ([place]) 08/19/2006  . WITHDRAWAL, DRUG 06/24/2006  . ANXIETY 06/24/2006  . DEPRESSION 06/24/2006  . MYOCARDIAL INFARCTION, HX OF 06/24/2006  . Coronary atherosclerosis 06/24/2006  . CARDIAC ARRHYTHMIA 06/24/2006  . CONSTIPATION 06/24/2006  . OSTEOARTHRITIS 06/24/2006  . LOW BACK PAIN 06/24/2006  . 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 He remains confused. Has significant dysphagia and is currently NPO with coretrak in place. Palliative medicine consulted for goals  of care for this  very ill man with multiple comorbidities who is at high risk of decompensation and dying.  Assessment/Recommendations/Plan -Patient remains invested in continuing with completing radiation, discussing further with his oncologist about next steps with regards to chemotherapy.  Wishes to increase his oral intake not really in favor of PEG tube  Continue current mode of care Recommend skilled nursing facility with palliative care to follow-up on discharge  Goals of Care and Additional Recommendations: Limitations on Scope of Treatment: Full Scope Treatment  Code Status: DNR  Prognosis:  guarded.   Discharge Planning: To Be Determined  Care plan was discussed with patient.   Thank you for allowing the Palliative Medicine Team to assist in the care of this patient.   Total time: 25 minutes Greater than 50%  of this time was spent counseling and coordinating care related to the above assessment and plan.   Loistine Chance, MD [place] Team [phone]  Please contact Palliative Medicine Team phone at [phone] for questions and concerns.

## 2020-11-06 NOTE — Progress Notes (Signed)
  Speech Language Pathology Treatment: Dysphagia  Patient Details Name: [name] MRN: [id number] DOB: 04-26-1951 Today's Date: 11/06/2020 Time: 4680-3212 SLP Time Calculation (min) (ACUTE ONLY): 25 min  Assessment / Plan / Recommendation Clinical Impression  Pt seen at bedside for assessment of advanced liquids, and to determine appropriateness for advanced solid textures. Pt has his dentures at bedside, but reports he can eat regular solids without them. Pt was observed with graham crackers and peanut butter, and with a [place] sandwich. Good oral prep and clearing noted. No overt s/s aspiration on either solid trial. Safe swallow precautions were updated and reviewed with pt. Will advance diet to dys 3 (mech soft) with thin liquids. Pt indicated he does not like chicken or mashed potatoes. He likes blueberry greek yogurt, and would like extra sauce/gravy on meats for moisture. SLP will continue to follow for diet tolerance and education.    HPI HPI: Pt is a 70 yo male admitted 3/30 with AMS after fall with unknown downtime (1-3 days suspected). Pt was found to have L hip fx, L L3 transverse process fx, L frontal sinus fx, and AKI with rhabdomyolysis. On 3/31 pt had worsening hypoxia thought to be related to an aspiration event, requiring ETT 3/31-4/2; another suspected aspiration event on requiring re-intubation 4/4-4/6. 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 Pt was reporting intermittent difficulty swallowing solids in 2019 per cancer center notes. Palliative care has been consulted and as of 4/13, family would like full scope of care. MBS 4/11: oropharyngeal dysphagia characterized by reduced bolus cohesion, a pharyngeal delay, reduced lingual retraction, and reduced anterior laryngeal movement. He demonstrated premature spillage to the valleculae with inconsistent spillover to the  pyriform sinuses, base of tongue residue, vallecular residue, pyriform sinus residue, penetration (PAS 3, 5) and inconsistent aspiration (PAS 7) of thin and nectar thick iquids. CXR 4/12: Mildly improved left lower lobe pneumonia. Mild changes of COPD.      SLP Plan  Continue with current plan of care       Recommendations  Diet recommendations: Dysphagia 3 (mechanical soft);Thin liquid Liquids provided via: Cup;Straw Medication Administration: Whole meds with puree Supervision: Staff to assist with self feeding;Intermittent supervision to cue for compensatory strategies Compensations: Slow rate;Small sips/bites;Minimize environmental distractions;Follow solids with liquid Postural Changes and/or Swallow Maneuvers: Seated upright 90 degrees;Upright 30-60 min after meal                Oral Care Recommendations: Oral care BID Follow up Recommendations: Skilled Nursing facility SLP Visit Diagnosis: Dysphagia, oropharyngeal phase (R13.12) Plan: Continue with current plan of care       [place] [facility] [place], [facility], [place] Speech Language Pathologist Office: [phone] Pager: [phone]  [name] 11/06/2020, 11:54 AM

## 2020-11-06 NOTE — Plan of Care (Signed)
  Problem: Education: Goal: Individualized Educational Video(s) Outcome: Progressing   Problem: Activity: Goal: Ability to ambulate and perform ADLs will improve Outcome: Progressing   Problem: Clinical Measurements: Goal: Postoperative complications will be avoided or minimized Outcome: Progressing   Problem: Clinical Measurements: Goal: Ability to maintain clinical measurements within normal limits will improve Outcome: Progressing Goal: Will remain free from infection Outcome: Progressing Goal: Diagnostic test results will improve Outcome: Progressing Goal: Respiratory complications will improve Outcome: Progressing Goal: Cardiovascular complication will be avoided Outcome: Progressing   Problem: Activity: Goal: Risk for activity intolerance will decrease Outcome: Progressing   Problem: Nutrition: Goal: Adequate nutrition will be maintained Outcome: Progressing   Problem: Coping: Goal: Level of anxiety will decrease Outcome: Progressing   Problem: Elimination: Goal: Will not experience complications related to bowel motility Outcome: Progressing   Problem: Pain Managment: Goal: General experience of comfort will improve Outcome: Progressing   Problem: Safety: Goal: Ability to remain free from injury will improve Outcome: Progressing   Problem: Skin Integrity: Goal: Risk for impaired skin integrity will decrease Outcome: Progressing   Problem: Safety: Goal: Non-violent Restraint(s) Outcome: Progressing

## 2020-11-06 NOTE — Progress Notes (Signed)
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 Admitted to [facility] on 3/30 for encephalopathy following a fall with associated facial fractures. Patient was found by brother on 3/30 after he had been unable to reach patient for unknown period of time (1 to 3 days suspected). Patient fracture left frontal sinus, L3 transverse process, displaced angulated mildly comminuted left femoral neck fracture. AKI with rhabdomyolysis.   3/31 patient develop worsening hypoxia and respiratory distress, intubated.  4/2 extubated and transferred out of the ICU on 4/3. T 4/3--4/4 he developed increased work of breathing and increased oxygen needs requiring intubation. This was thought to be related to an aspiration event.  4/6 -  extubated 4/12 - questionable aspiration pneumonia, Unasyn initiated now completed Interim - worsening mental status, MRI of the brain on 4/16 shows newly diagnosed metastatic disease, transferred to [facility] for radiation oncology evaluation and steroids 4/18 - mental status improving, patient AOx4, somewhat sluggish but appropriate.  We had a lengthy discussion about goals of care, patient has not yet decided on goals of care, hospice, CODE STATUS as he is still dealing with trying to understand his new diagnosis and states he has not had a meaningful conversation with oncology about his prognosis and life expectancy.  **Interim History  On 10/30/20 the patient attempted to get out of bed without calling for staff to help and fell on the floor. Repeat Imaging done and unremarkable.  PT OT still recommending skilled nursing facility.  Patient mentation looks like it is improving.  Palliative care to have further goals of care discussion and patient's primary oncologist to call the patient and  discuss further options.  Patient wishes to be DNR now so his CODE STATUS was changed.  Palliative met with the patient and discussed his options about his feeding given his poor p.o. intake and patient wishes to hold off his NG tube today.  Patient discussed with his primary oncologist and he wishes to pursue aggressive treatment and the recommendation was for PEG tube placement so we will consult interventional radiology for placement.  Patient continues to want aggressive measures and wants to follow-up with Dr. Delton Coombes for aggressive chemotherapy.   Plan is for PEG tube placement as his CT scan showed that his anatomy was amenable to PEG tube placement however patient refused at this morning and wanted more time to think and wanted to see if his diet could be advanced prior to fully committing to a PEG tube placement.  SLP has been reconsulted and we will plan for another MBS to see if his diet can be advanced from a dysphagia 2 diet and see how he does in terms of aspiration risk and they have changed him to Thin Liquids now ans recommending Dysphagia 3 Testing tomorrow.  We have also ordered a calorie count for the patient for next 48 hours and consulted dietitian to see how much the patient is actually taking in.  Plan is to D/C to SNF but currently PEG is deferred for now as he wasnts to see if his diet can be advanced and if he is not meeting his oral nutrition adequately, PEG tube will need to be placed and patient understands this. Patient is very upset at his brother and requests not information be divulged to him. CSW consulted  to speak with patient at his request. Orthopedics re-evaulated and recommending WBAT on the LLE and recommending removing sutures of his left hip.   Calorie count is currently ongoing and SLP to try dysphagia 3 diet today.  He continues have severe malnutrition in the context of chronic illness and dietitians recommending continue boost breeze daily, Magic cup 3 times  daily and Ensure Enlive twice daily and they are can add 30 mL of Prosource plus p.o. twice daily as well as Carnation instant breakfast p.o. 3 times daily  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 [facility])   Left displaced femoral neck fracture (HCC)   Closed fracture of transverse process of lumbar vertebra (HCC)   Pressure injury of skin   Closed fracture of left hip (HCC)   Protein-calorie malnutrition, severe   Status post total replacement of left hip   Non-traumatic rhabdomyolysis   Fall   Left-sided weakness   Acute respiratory failure with hypoxia (HCC)   Oropharyngeal dysphagia   Sacral pressure ulcer   Orthostatic hypotension   Primary malignant neoplasm of lung with metastasis to brain [facility])   Essential hypertension   Thrombocytopenia (HCC)   Tobacco abuse  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 Delton Coombes with oncology and Dr. Sondra Come with rads oncology  -Currently now at [facility] for ongoing palliative radiation -Dr. Ashok Pall neurosurgery does not believe patient currently needs shunt however states if radiation oncology requires shunt placement he is available for placement -Neurology recommends continuing radiation brain treatments and having the patient be discharged to the skilled nursing facility -C/w Supportive care  -Currently getting Radiation TX and will receive a total of 10 Tx total (started 10/28/20); will complete radiation therapy if he continues to pursue aggressive care and will need a PEG placement given his poor p.o. intake however he refused it this morning; see below -Patient spoke with his primary oncologist and has elected for PEG tube placement.  Interventional radiology has been consulted for assistance with placement and he underwent a CT scan yesterday which showed that his anatomy was amenable for PEG placement -PEG was to be placed 11/05/20 but patient refused placement and  wanted to see if he could tolerate advancing his diet and seeing how you do with a calorie count; PEG placed on hold and SLP consulted for further evaluation and recommendations and patient will undergo a repeat MBS today; dietitian consulted and he is on a 48-hour calorie count now and his diet has been advanced to dysphagia 3 diet and per nursing he ate all of his breakfast this morning  Acute respiratory failure with hypoxia/Aspiration pneumonia/mucus plugging/likely history of emphysema requiring intubation/PPV, Resolving - 4/5 Mucus Plug likely the cause of acute hypoxia - Patient previously requiring Ventimask when sleeping given mouth breathing with NG tube in -SpO2: 95 % O2 Flow Rate (L/min): 0 L/min FiO2 (%): 21 % -No longer on Supplemental O2 via [place] and his respiratory status is extremely stable  Hx Small cell lung cancer  -S/p chemotherapy Lurbinectedin last cycle 09/12/20. -Extubated 4\6 -Completed 7 days of cefepime 10/16/20. - Brovana/Yupelri nebulizer treatments and PRN albuterol.  -received Solumedrol on 4/05 during mucus plug event, dexamethasone ongoing given his brain metastasis -Completed 5-day course Unasyn 4/17 -Patient wants aggressive measures and initially had elected for PEG tube placement for optimizing nutrition in the setting of getting aggressive chemotherapy as an outpatient but currently  refused it today and wanted to wait a little bit longer and see how he does with advancing his diet possibly and see how he truly does with a calorie count and he seems to do fairly well today as he ate all of his breakfast  Orthostatic Hypotension - Albumin given previously with minimal improvement -C/w TED hose 12 hours on/12 hours off - NS 50 ml/hr; Tube feeds w/ free water flushes were initiated but now stopped; patient has decided about PEG tube so we will reinitiate tube feedings -Further goals of care discussions being had and he wishes to pursue aggressive  measures -Repeat orthostatic vital signs prior to discharging and will need to be done in the AM   Essential HTN/Ventricular Tachycardia - The night before last reported patient had a couple runs of ventricular tachycardia in addition rise in BP.  -Most likely secondary to his multiple brain mets with hydrocephalus - Allow permissive HTN - Metoprolol 12.5 mg BID - Hydralazine PRN SBP> 170 or DBP> 110 -Last blood pressure was on the softer side of 115/52  Nausea/vomiting - 4/14 resolved   Diarrhea;  - Resolved after stopping laxative -tube feeds discontinued for now but may need to be reinitiated; See below   Acute metabolic encephalopathy, resolving and improving Cannot rule out acute hospital delirium.  - Likely in the setting of metastatic disease, acute respiratory failure, aspiration pneumonia and ICU delirium. - Continue Gabapentin,dose was reduced to 100 mg 3 times daily from 300 mg. -Remains somewhat slow to answer but more appropriate, ANO x4 for the past 72 hours -Palliative Care consulted for [place] Discussion -Patient getting Radiation Tx and currently getting 10 scheduled radiation treatments; I spoke with case management about discharging to SNF and patient will need to have his nutrition addressed and had initially elected for PEG tube placement and this was to be done today but patient refused this AM wanted to see how he would do with SLP re-evaluation and Calorie Count first.   Left femoral Neck Fx S/P Hemiarthroplasty:  -PT following -Mechanical fall 10/30/2020, plain film unremarkable, orthopedic sidelined, no further imaging or intervention required -Continue to Monitor and C/w Pain Control   Left Facial Fx, L 3 TP Fx -Neurosurgery evaluated , no surgical intervention needed.  -Neurosurgery recommend ENT consult. -Spoke with ENT on called for [place] recommend Conservative management, Fracture is non displaced, does not need anything acute.   Rhabdomyolysis,  POA -Resolved with IV fluids.   Thrombocytopenia, POA -Thrombocytopenia has now resolved and last Platelet Count was 225 -Continue to Monitor for S/Sx of Bleeding; No overt bleeding noted -Repeat CBC in the AM   Normocytic Anemia, likely chronic anemia of chronic disease - 4/6 transfused 1 unit PRBC -Patient's Hgb/Hct went from 8.7/27.4 -> 9.0/27.8 -> 9.3/29.2 -> 9.6/29.9 -> 10.0/31.1 -> 10.3/31.6 -> 9.8/30.1 -Continue to Monitor for S/Sx of Bleeding -No overt bleeding noted  GERD -C/w Famotidine 20 mg po BID   Tobacco Abuse -Smoking Cessasstion counseling given  -C/w Nicotine Patch 14 mg TD Daily   Severe malnutrition in the context of chronic illness -Patient was on a dysphagia 2 diet but was upgraded to thin liquids yesterday; SLP to try dysphagia 3 diet today and he has been successful so they have upgraded him to dysphagia 3 with thin liquids -Calorie count is undergoing for 48 hours and nutritionist is recommending continuing boost breeze daily, Magic cup 3 times daily, Ensure Enlive p.o. twice daily, unable to add 30 mL of Prosource plus p.o.  twice daily and adding Carnation instant breakfast p.o. 3 times daily -If patient fails his calorie count will need a PEG tube placement to try to meet his nutritional needs  Sacral pressure ulcer, POA Pressure Injury 10/11/20 Sacrum Medial Deep Tissue Pressure Injury - Purple or maroon localized area of discolored intact skin or blood-filled blister due to damage of underlying soft tissue from pressure and/or shear. (Active)  10/11/20 0220  Location: Sacrum  Location Orientation: Medial  Staging: Deep Tissue Pressure Injury - Purple or maroon localized area of discolored intact skin or blood-filled blister due to damage of underlying soft tissue from pressure and/or shear.  Wound Description (Comments):   Present on Admission: Yes     Pressure Injury 10/15/20 Heel Right Deep Tissue Pressure Injury - Purple or maroon localized area  of discolored intact skin or blood-filled blister due to damage of underlying soft tissue from pressure and/or shear. (Active)  10/15/20 0323  Location: Heel  Location Orientation: Right  Staging: Deep Tissue Pressure Injury - Purple or maroon localized area of discolored intact skin or blood-filled blister due to damage of underlying soft tissue from pressure and/or shear.  Wound Description (Comments):   Present on Admission: No     Pressure Injury 10/27/20 Knee Anterior;Right;Lateral Deep Tissue Pressure Injury - Purple or maroon localized area of discolored intact skin or blood-filled blister due to damage of underlying soft tissue from pressure and/or shear. (Active)  10/27/20 1941  Location: Knee  Location Orientation: Anterior;Right;Lateral  Staging: Deep Tissue Pressure Injury - Purple or maroon localized area of discolored intact skin or blood-filled blister due to damage of underlying soft tissue from pressure and/or shear.  Wound Description (Comments):   Present on Admission: Yes   Severe protein calorie malnutrition -Dietitian consulted and evaluated ad recommending Changing TF to Jevity 1.5 @ 60 mL/hr x 14 hours a day with Prosource TF once/day and 150 mL Free Water q4h if NGT/PEG is placed -NG tube/core track incidentally removed yesterday morning after fall, will hold off on replacement and advance diet as tolerated. - Speech placed him on dysphagia 2 diet with thick liquid after getting a barium swallow, please see the full report-remains high risk for aspiration. - After discussion with his primary oncologist he wants to pursue aggressive measures and has elected for PEG tube placement for nutritional support but then this AM refused; See above -C/w Mirtazapine 30 mg po qHS for Appetite   GOALS OF CARE -Discussed with palliative care for advancement of hospice and end-of-life care discussion in the next 24 - 48h however his primary oncologist will call the patient this  afternoon; brother Levada Dy was very understanding of the patient's current situation and prognosis but patient now does not want to disclose any information to his brother given what the patient's brother has been doing in the outpatient setting with the patient's finances.  -Dr. Avon Gully had a lengthy talk at bedside with the patient about his poor prognosis, ongoing palliative treatment and goals.  Patient now aware advanced stage of metastatic disease with grim prognosis but still wishes aggressive treatment -We did discuss discharge to his brother's house versus hospice house, he does not wish to live with his brother, palliative care to continue this conversation for possible hospice house admission once patient has completed inpatient therapies however his primary oncologist is advocating for SNF which he is agreeable to -CODE Status changed to DNR per patient's wishes after [place] discussion -Patient still wants to pursue full measures and aggressive  chemotherapy but has elected to defer PEG placement and wants to see how he does with diet advancement possibly an SLP reevaluation and 48-hour calorie count and re-evaluate -His Oncologist has been discussing with the patient conversations about care and has been in frequent contact with the patient   DVT prophylaxis: SCDS; Heparin 5,000 units sq q8h Code Status: DO NOT RESUSCITATE  Family Communication: No family present at bedside and patient does not want any information disclosed to his brother. Disposition Plan: SNF if improved but continues to have Palliative Care Discussions and has elected to pursue aggressive measures  Status is: Inpatient  Remains inpatient appropriate because:Unsafe d/c plan, IV treatments appropriate due to intensity of illness or inability to take PO and Inpatient level of care appropriate due to severity of illness   Dispo:  Patient From:   Home  Planned Disposition: To be determined but likely SNF vs. Hospice  depending on further palliative discussions  Medically stable for discharge:   NO    Consultants:   ENT  Neurosurgery  Palliative Care Medicine  Radiation Oncology  Medical Oncology consulted but his Primary Oncologist is at [facility]  Procedures/Significant Events:  3/30 - admitted to [facility] for encephalopathy. Facial fx, L3 TP fx, Hip fx.  3/31 non op from [place] standpoint. Ortho planned to OR 3/31 however acute hypoxic respiratory failure, transferred to ICU and emergently intubated.  3/31 ETT >>4/1 3/31 bronchoscopy with BAL left lower lobe 4/1 LT Hip Hemiarthroplasty (Anterior Approach) 4/4 ETT>>4/6 4/12Unasyn for concern of aspiration -now completed 4/16 -MRI brain showing metastatic disease  Cultures 4/4 tracheal aspirate positive rare Candida Krusei  4/4 MRSA by PCR negative  Antimicrobials:  Anti-infectives (From admission, onward)   Start     Dose/Rate Route Frequency Ordered Stop   10/23/20 1330  Ampicillin-Sulbactam (UNASYN) 3 g in sodium chloride 0.9 % 100 mL IVPB        3 g 200 mL/hr over 30 Minutes Intravenous Every 6 hours 10/23/20 1241 10/28/20 2359   10/15/20 2200  ceFEPIme (MAXIPIME) 2 g in sodium chloride 0.9 % 100 mL IVPB        2 g 200 mL/hr over 30 Minutes Intravenous Every 12 hours 10/15/20 1121 10/16/20 2332   10/15/20 0400  vancomycin (VANCOREADY) IVPB 1500 mg/300 mL  Status:  Discontinued        1,500 mg 150 mL/hr over 120 Minutes Intravenous Every 24 hours 10/15/20 0300 10/15/20 1324   10/12/20 1254  vancomycin (VANCOCIN) powder  Status:  Discontinued          As needed 10/12/20 1255 10/12/20 1328   10/11/20 2100  vancomycin (VANCOREADY) IVPB 1250 mg/250 mL  Status:  Discontinued        1,250 mg 166.7 mL/hr over 90 Minutes Intravenous Every 24 hours 10/10/20 2104 10/12/20 0834   10/11/20 1800  ceFEPIme (MAXIPIME) 2 g in sodium chloride 0.9 % 100 mL IVPB  Status:  Discontinued        2 g 200 mL/hr over 30 Minutes Intravenous Every 8 hours  10/11/20 1230 10/15/20 1121   10/11/20 1200  ceFAZolin (ANCEF) IVPB 2g/100 mL premix        2 g 200 mL/hr over 30 Minutes Intravenous On call to O.R. 10/11/20 1114 10/12/20 0559   10/10/20 2100  vancomycin (VANCOREADY) IVPB 1250 mg/250 mL        1,250 mg 166.7 mL/hr over 90 Minutes Intravenous  Once 10/10/20 2058 10/10/20 2327   10/10/20 2100  ceFEPIme (  MAXIPIME) 2 g in sodium chloride 0.9 % 100 mL IVPB  Status:  Discontinued        2 g 200 mL/hr over 30 Minutes Intravenous Every 12 hours 10/10/20 2058 10/11/20 1230        Subjective: Seen and examined at bedside he states that he is eating a little bit better and ate all of his food today.  Still waiting to see his 48-hour calorie count to see if PEG is actually needed or not.  No nausea or vomiting.  Dietitian came by and added some more supplement.  He feels okay but still weak.  No other concerns or complaints at this time.  Objective: Vitals:   11/05/20 1420 11/05/20 2112 11/06/20 0503 11/06/20 0857  BP: (!) 110/55 122/62 129/62   Pulse: 60 62 63   Resp: _0 Temp: 97.7 F (36.5 C) 97.8 F (36.6 C) 98.2 F (36.8 C)   TempSrc: Oral Oral Oral   SpO2: 93% 96% 95% 95%  Weight:      Height:        Intake/Output Summary (Last 24 hours) at 11/06/2020 1241 Last data filed at 11/06/2020 1100 Gross per 24 hour  Intake 1793.42 ml  Output 2775 ml  Net -981.58 ml   Filed Weights   11/02/20 0500 11/03/20 0605 11/04/20 0422  Weight: 63.2 kg 60.3 kg 62.1 kg   Examination: Physical Exam:  Constitutional: The patient is a thin chronically ill-appearing Caucasian male currently no acute distress appears calm Eyes: Lids and conjunctivae normal, sclerae anicteric  ENMT: External Ears, Nose appear normal. Grossly normal hearing  Neck: Appears normal, supple, no cervical masses, normal ROM, no appreciable thyromegaly; no JVD Respiratory: Diminished to auscultation bilaterally, no wheezing, rales, rhonchi or crackles. Normal  respiratory effort and patient is not tachypenic. No accessory muscle use has unlabored breathing Cardiovascular: RRR, no murmurs / rubs / gallops. S1 and S2 auscultated.  Very trace extremity edema Abdomen: Soft, non-tender, non-distended. Bowel sounds positive.  GU: Deferred. Musculoskeletal: No clubbing / cyanosis of digits/nails. No joint deformity upper and lower extremities.  Skin: No rashes, lesions, ulcers on limited skin evaluation. No induration; Warm and dry.  Neurologic: CN 2-12 grossly intact with no focal deficits. Romberg sign and cerebellar reflexes not assessed.  Psychiatric: Normal judgment and insight. Alert and oriented x 3. Normal mood and appropriate affect.   Data Reviewed: I have personally reviewed following labs and imaging studies  CBC: Recent Labs  Lab 11/02/20 0351 11/03/20 0442 11/04/20 0304 11/05/20 0439 11/06/20 0454  WBC 5.4 7.2 8.2 8.9 8.3  NEUTROABS 4.0 5.8 7.3 7.8* 7.2  HGB 9.6* 10.0* 10.3* 9.8* 9.4*  HCT 29.9* 31.1* 31.6* 30.1* 29.7*  MCV 97.4 99.7 98.1 98.0 99.7  PLT 279 275 272 225 543   Basic Metabolic Panel: Recent Labs  Lab 11/02/20 0351 11/03/20 0442 11/04/20 0304 11/05/20 0439 11/06/20 0454  NA 137 136 136 135 136  K 4.0 3.4* 4.1 4.3 4.2  CL 106 105 106 104 104  CO2 _1 GLUCOSE 105* 139* 129* 134* 125*  BUN 33* 28* 30* 31* 31*  CREATININE 0.74 0.70 0.70 0.75 0.75  CALCIUM 9.1 8.7* 8.8* 8.8* 8.5*  MG 2.3 2.0 2.1 2.0 2.1  PHOS 3.6 2.8 3.0 3.0 3.5   GFR: Estimated Creatinine Clearance: 76.5 mL/min (by C-G formula based on SCr of 0.75 mg/dL). Liver Function Tests: Recent Labs  Lab 11/02/20 0351 11/03/20 0442 11/04/20 0304 11/05/20  3762 11/06/20 0454  AST 9* 12* 12* 14* 12*  ALT _0 ALKPHOS 67 73 78 72 61  BILITOT 0.4 <0.1* 0.5 0.5 0.4  PROT 5.8* 5.7* 6.1* 5.8* 5.4*  ALBUMIN 3.1* 3.0* 3.3* 3.0* 3.0*   No results for input(s): LIPASE, AMYLASE in the last 168 hours. No results for input(s):  AMMONIA in the last 168 hours. Coagulation Profile: No results for input(s): INR, PROTIME in the last 168 hours. Cardiac Enzymes: No results for input(s): CKTOTAL, CKMB, CKMBINDEX, TROPONINI in the last 168 hours. BNP (last 3 results) No results for input(s): PROBNP in the last 8760 hours. HbA1C: No results for input(s): HGBA1C in the last 72 hours. CBG: Recent Labs  Lab 11/05/20 2057 11/05/20 2354 11/06/20 0504 11/06/20 0816 11/06/20 1139  GLUCAP 135* 130* 124* 125* 155*   Lipid Profile: No results for input(s): CHOL, HDL, LDLCALC, TRIG, CHOLHDL, LDLDIRECT in the last 72 hours. Thyroid Function Tests: No results for input(s): TSH, T4TOTAL, FREET4, T3FREE, THYROIDAB in the last 72 hours. Anemia Panel: No results for input(s): VITAMINB12, FOLATE, FERRITIN, TIBC, IRON, RETICCTPCT in the last 72 hours. Sepsis Labs: No results for input(s): PROCALCITON, LATICACIDVEN in the last 168 hours.  No results found for this or any previous visit (from the past 240 hour(s)).   RN Pressure Injury Documentation: Pressure Injury 10/11/20 Sacrum Medial Deep Tissue Pressure Injury - Purple or maroon localized area of discolored intact skin or blood-filled blister due to damage of underlying soft tissue from pressure and/or shear. (Active)  10/11/20 0220  Location: Sacrum  Location Orientation: Medial  Staging: Deep Tissue Pressure Injury - Purple or maroon localized area of discolored intact skin or blood-filled blister due to damage of underlying soft tissue from pressure and/or shear.  Wound Description (Comments):   Present on Admission: Yes     Pressure Injury 10/15/20 Heel Right Deep Tissue Pressure Injury - Purple or maroon localized area of discolored intact skin or blood-filled blister due to damage of underlying soft tissue from pressure and/or shear. (Active)  10/15/20 0323  Location: Heel  Location Orientation: Right  Staging: Deep Tissue Pressure Injury - Purple or maroon  localized area of discolored intact skin or blood-filled blister due to damage of underlying soft tissue from pressure and/or shear.  Wound Description (Comments):   Present on Admission: No     Pressure Injury 10/27/20 Knee Anterior;Right;Lateral Deep Tissue Pressure Injury - Purple or maroon localized area of discolored intact skin or blood-filled blister due to damage of underlying soft tissue from pressure and/or shear. (Active)  10/27/20 1941  Location: Knee  Location Orientation: Anterior;Right;Lateral  Staging: Deep Tissue Pressure Injury - Purple or maroon localized area of discolored intact skin or blood-filled blister due to damage of underlying soft tissue from pressure and/or shear.  Wound Description (Comments):   Present on Admission: Yes   Estimated body mass index is 20.23 kg/m as calculated from the following:   Height as of this encounter: _1  (1.753 m).   Weight as of this encounter: 62.1 kg.  Malnutrition Type:  Nutrition Problem: Severe Malnutrition Etiology: chronic illness (small cell carcinoma of the lung)  Malnutrition Characteristics:  Signs/Symptoms: severe muscle depletion,severe fat depletion  Nutrition Interventions:  Interventions: Ensure Enlive (each supplement provides 350kcal and 20 grams of protein),Boost Breeze,Magic cup,Prostat,Carnation Instant Breakfast   Radiology Studies: DG Swallowing Func-Speech Pathology  Result Date: 11/05/2020 Objective Swallowing Evaluation: Type of Study: MBS-Modified Barium Swallow Study  Patient Details Name: [place]  APOLONIO CUTTING MRN: [id number] Date of Birth: [birth year] Today's Date: 11/05/2020 Time: SLP Start Time (ACUTE ONLY): 1445 -SLP Stop Time (ACUTE ONLY): 1505 SLP Time Calculation (min) (ACUTE ONLY): 20 min Past Medical History: Past Medical History: Diagnosis Date . Anxiety  . CAD (coronary artery disease)  . Cancer ([place])   stage 4 small cell lung cancer  . COPD (chronic obstructive pulmonary disease) ([place])  .  Depression  . Dyspnea   increased exertion . Feeling of chest tightness  . Heart palpitations  . History of chemotherapy  . Myocardial infarction ([place])  . Osteopenia  . Panic attacks  . Smoker  Past Surgical History: Past Surgical History: Procedure Laterality Date . BACK SURGERY  12/24/2000  L5,S1 . CORONARY STENT PLACEMENT  2005  RCA & CX . HERNIA REPAIR Right 1980's . INGUINAL HERNIA REPAIR  12/1978  right side . IR FLUORO GUIDE PORT INSERTION RIGHT  04/02/2017 . IR US GUIDE BX ASP/DRAIN  02/03/2017 . IR US GUIDE VASC ACCESS RIGHT  04/02/2017 . NM MYOCAR PERF WALL MOTION  09/07/2009  No ischemia; EF 51% . SHOULDER SURGERY Left 08/2010 . SPINE SURGERY  2002  L5-S1 . TOTAL HIP ARTHROPLASTY Left 10/12/2020  Procedure: TOTALhemi  HIP ARTHROPLASTY ANTERIOR APPROACH;  Surgeon: [name], MD;  Location: [facility];  Service: Orthopedics;  Laterality: Left; HPI: Pt is a 70 yo male admitted 3/30 with AMS after fall with unknown downtime (1-3 days suspected). Pt was found to have L hip fx, L L3 transverse process fx, L frontal sinus fx, and AKI with rhabdomyolysis. On 3/31 pt had worsening hypoxia thought to be related to an aspiration event, requiring ETT 3/31-4/2; another suspected aspiration event on requiring re-intubation 4/4-4/6. 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 Pt was reporting intermittent difficulty swallowing solids in 2019 per cancer center notes. Palliative care has been consulted and as of 4/13, family would like full scope of care. MBS 4/11: oropharyngeal dysphagia characterized by reduced bolus cohesion, a pharyngeal delay, reduced lingual retraction, and reduced anterior laryngeal movement. He demonstrated premature spillage to the valleculae with inconsistent spillover to the pyriform sinuses, base of tongue residue, vallecular residue, pyriform sinus residue, penetration (PAS 3, 5) and inconsistent  aspiration (PAS 7) of thin and nectar thick iquids. CXR 4/12: Mildly improved left lower lobe pneumonia. Mild changes of COPD.  Subjective: pleasant, "Ill do what you want me to do" Assessment / Plan / Recommendation CHL IP CLINICAL IMPRESSIONS 11/05/2020 Clinical Impression Patient presents with an improved swallow function as compared to previous MBS on 4/11. He continues to exhibit oral phase delays in transit of liquids and solids, leading to premature spillage of puree and regular solids into vallecular sinus. Pharyngeally, he continues to exhibit consistent penetration of thin liquids, however penetrate is trace and does not remain in laryngeal vestibule. No deep penetration or aspiration was observed during this study even when patient instructed to take straw sips and large volume cup sips. Chin tuck posture with thin liquids was observed during first sip of thin liquids but did not appear to make significant improvements. Patient exhibited minimal vallecular residuals of puree and regular solids post initial swallow, and trace to minimal vallecular, pyriform and lateral channel residuals with thin liquids. Residuals would clear out of pharynx with subsequent dry swallows. No penetration or aspiration observed from pharyngeal residuals. SLP is recommending upgrade of patient's liquids from nectar thick to thin  but wait for bedside trials of dysphagia 3 solids before upgrading solid textures. SLP Visit Diagnosis Dysphagia, oropharyngeal phase (R13.12) Attention and concentration deficit following -- Frontal lobe and executive function deficit following -- Impact on safety and function Mild aspiration risk   CHL IP TREATMENT RECOMMENDATION 11/05/2020 Treatment Recommendations Therapy as outlined in treatment plan below   Prognosis 11/05/2020 Prognosis for Safe Diet Advancement Good Barriers to Reach Goals -- Barriers/Prognosis Comment -- CHL IP DIET RECOMMENDATION 11/05/2020 SLP Diet Recommendations Dysphagia 2  (Fine chop) solids;Thin liquid Liquid Administration via Cup;Straw Medication Administration Whole meds with liquid Compensations Slow rate;Small sips/bites;Minimize environmental distractions;Follow solids with liquid Postural Changes Seated upright at 90 degrees   CHL IP OTHER RECOMMENDATIONS 11/05/2020 Recommended Consults -- Oral Care Recommendations Oral care BID Other Recommendations --   CHL IP FOLLOW UP RECOMMENDATIONS 11/05/2020 Follow up Recommendations Skilled Nursing facility   [facility] IP FREQUENCY AND DURATION 11/05/2020 Speech Therapy Frequency (ACUTE ONLY) min 2x/week Treatment Duration 2 weeks      CHL IP ORAL PHASE 11/05/2020 Oral Phase Impaired Oral - Pudding Teaspoon -- Oral - Pudding Cup -- Oral - Honey Teaspoon -- Oral - Honey Cup -- Oral - Nectar Teaspoon -- Oral - Nectar Cup Delayed oral transit Oral - Nectar Straw NT Oral - Thin Teaspoon NT Oral - Thin Cup -- Oral - Thin Straw Delayed oral transit Oral - Puree Delayed oral transit;Premature spillage Oral - Mech Soft NT Oral - Regular Impaired mastication;Delayed oral transit;Premature spillage Oral - Multi-Consistency -- Oral - Pill Decreased bolus cohesion Oral Phase - Comment --  CHL IP PHARYNGEAL PHASE 11/05/2020 Pharyngeal Phase Impaired Pharyngeal- Pudding Teaspoon -- Pharyngeal -- Pharyngeal- Pudding Cup -- Pharyngeal -- Pharyngeal- Honey Teaspoon -- Pharyngeal -- Pharyngeal- Honey Cup -- Pharyngeal -- Pharyngeal- Nectar Teaspoon -- Pharyngeal -- Pharyngeal- Nectar Cup Delayed swallow initiation-vallecula;Reduced airway/laryngeal closure;Penetration/Aspiration during swallow;Pharyngeal residue - valleculae;Pharyngeal residue - pyriform Pharyngeal Material enters airway, remains ABOVE vocal cords then ejected out Pharyngeal- Nectar Straw NT Pharyngeal -- Pharyngeal- Thin Teaspoon -- Pharyngeal -- Pharyngeal- Thin Cup Delayed swallow initiation-vallecula;Pharyngeal residue - valleculae;Pharyngeal residue - pyriform;Penetration/Aspiration during  swallow;Reduced airway/laryngeal closure;Lateral channel residue Pharyngeal Material enters airway, remains ABOVE vocal cords then ejected out Pharyngeal- Thin Straw Delayed swallow initiation-vallecula;Penetration/Aspiration during swallow;Reduced airway/laryngeal closure;Pharyngeal residue - valleculae;Pharyngeal residue - pyriform;Lateral channel residue Pharyngeal Material enters airway, remains ABOVE vocal cords then ejected out Pharyngeal- Puree Pharyngeal residue - valleculae;Pharyngeal residue - pyriform Pharyngeal -- Pharyngeal- Mechanical Soft -- Pharyngeal -- Pharyngeal- Regular Pharyngeal residue - valleculae;Pharyngeal residue - pyriform Pharyngeal -- Pharyngeal- Multi-consistency -- Pharyngeal -- Pharyngeal- Pill WFL Pharyngeal -- Pharyngeal Comment --  CHL IP CERVICAL ESOPHAGEAL PHASE 11/05/2020 Cervical Esophageal Phase WFL Pudding Teaspoon -- Pudding Cup -- Honey Teaspoon -- Honey Cup -- Nectar Teaspoon -- Nectar Cup -- Nectar Straw -- Thin Teaspoon -- Thin Cup -- Thin Straw -- Puree -- Mechanical Soft -- Regular -- Multi-consistency -- Pill -- Cervical Esophageal Comment -- Sonia Baller, MA, CCC-SLP Speech Therapy             Scheduled Meds: . (feeding supplement) PROSource Plus  30 mL Oral BID BM  . arformoterol  15 mcg Nebulization BID  . aspirin  81 mg Oral Daily  . chlorhexidine  15 mL Mouth Rinse BID  . Chlorhexidine Gluconate Cloth  6 each Topical Daily  . dexamethasone (DECADRON) injection  4 mg Intravenous Q6H  . diphenoxylate-atropine  5 mL Oral Daily  . doxazosin  2 mg Oral Daily  .  famotidine  20 mg Oral BID  . feeding supplement  1 Container Oral Q24H  . feeding supplement  237 mL Oral BID BM  . gabapentin  100 mg Oral Q8H  . heparin injection (subcutaneous)  5,000 Units Subcutaneous Q8H  . insulin aspart  0-9 Units Subcutaneous Q4H  . mouth rinse  15 mL Mouth Rinse q12n4p  . melatonin  5 mg Oral QHS  . metoprolol tartrate  12.5 mg Oral BID  . mirtazapine  30 mg  Oral QHS  . nicotine  14 mg Transdermal Daily  . revefenacin  175 mcg Nebulization Daily  . sodium chloride flush  10-40 mL Intracatheter Q12H  . sodium chloride flush  10-40 mL Intracatheter Q12H   Continuous Infusions: . sodium chloride Stopped (10/25/20 0829)  . sodium chloride 50 mL/hr at 11/05/20 2129    LOS: 27 days   Kerney Elbe, DO [facility] PAGER is on [place]  If 7PM-7AM, please contact night-coverage [email or web address]

## 2020-11-07 ENCOUNTER — Ambulatory Visit
Admit: 2020-11-07 | Discharge: 2020-11-07 | Disposition: A | Discharge status: Home / Self Care | Payer: Medicare HMO | Attending: Radiation Oncology | Admitting: Radiation Oncology

## 2020-11-07 DIAGNOSIS — R739 Hyperglycemia, unspecified: Secondary | ICD-10-CM

## 2020-11-07 LAB — COMPREHENSIVE METABOLIC PANEL
ALT: 25 U/L (ref 0–44)
AST: 18 U/L (ref 15–41)
Albumin: 2.7 g/dL — ABNORMAL LOW (ref 3.5–5.0)
Alkaline Phosphatase: 74 U/L (ref 38–126)
Anion gap: 7 (ref 5–15)
BUN: 42 mg/dL — ABNORMAL HIGH (ref 8–23)
CO2: 24 mmol/L (ref 22–32)
Calcium: 8.7 mg/dL — ABNORMAL LOW (ref 8.9–10.3)
Chloride: 108 mmol/L (ref 98–111)
Creatinine, Ser: 0.81 mg/dL (ref 0.61–1.24)
GFR, Estimated: >60 mL/min (ref >60)
Glucose, Bld: 145 mg/dL — ABNORMAL HIGH (ref 70–99)
Potassium: 4.1 mmol/L (ref 3.5–5.1)
Sodium: 139 mmol/L (ref 135–145)
Total Bilirubin: 0.3 mg/dL (ref 0.3–1.2)
Total Protein: 4.9 g/dL — ABNORMAL LOW (ref 6.5–8.1)

## 2020-11-07 LAB — PHOSPHORUS: Phosphorus: 2.7 mg/dL (ref 2.5–4.6)

## 2020-11-07 LAB — CBC WITH DIFFERENTIAL/PLATELET
Abs Immature Granulocytes: 0.69 10*3/uL — ABNORMAL HIGH (ref 0.00–0.07)
Basophils Absolute: 0 10*3/uL (ref 0.0–0.1)
Basophils Relative: 0 %
Eosinophils Absolute: 0 10*3/uL (ref 0.0–0.5)
Eosinophils Relative: 0 %
HCT: 29.8 % — ABNORMAL LOW (ref 39.0–52.0)
Hemoglobin: 9.5 g/dL — ABNORMAL LOW (ref 13.0–17.0)
Immature Granulocytes: 6 %
Lymphocytes Relative: 3 %
Lymphs Abs: 0.3 10*3/uL — ABNORMAL LOW (ref 0.7–4.0)
MCH: 31.6 pg (ref 26.0–34.0)
MCHC: 31.9 g/dL (ref 30.0–36.0)
MCV: 99 fL (ref 80.0–100.0)
Monocytes Absolute: 0.5 10*3/uL (ref 0.1–1.0)
Monocytes Relative: 5 %
Neutro Abs: 9.7 10*3/uL — ABNORMAL HIGH (ref 1.7–7.7)
Neutrophils Relative %: 86 %
Platelets: 188 10*3/uL (ref 150–400)
RBC: 3.01 MIL/uL — ABNORMAL LOW (ref 4.22–5.81)
RDW: 20.4 % — ABNORMAL HIGH (ref 11.5–15.5)
WBC: 11.2 10*3/uL — ABNORMAL HIGH (ref 4.0–10.5)
nRBC: 0.3 % — ABNORMAL HIGH (ref 0.0–0.2)

## 2020-11-07 LAB — GLUCOSE, CAPILLARY
Glucose-Capillary: 134 mg/dL — ABNORMAL HIGH (ref 70–99)
Glucose-Capillary: 137 mg/dL — ABNORMAL HIGH (ref 70–99)
Glucose-Capillary: 154 mg/dL — ABNORMAL HIGH (ref 70–99)
Glucose-Capillary: 157 mg/dL — ABNORMAL HIGH (ref 70–99)
Glucose-Capillary: 162 mg/dL — ABNORMAL HIGH (ref 70–99)
Glucose-Capillary: 172 mg/dL — ABNORMAL HIGH (ref 70–99)

## 2020-11-07 LAB — MAGNESIUM: Magnesium: 2 mg/dL (ref 1.7–2.4)

## 2020-11-07 MED ORDER — DEXAMETHASONE SODIUM PHOSPHATE 4 MG/ML IJ SOLN
4.0000 mg | Freq: Three times a day (TID) | INTRAMUSCULAR | Status: DC
  Administered 2020-11-07 – 2020-11-09 (×5): 4 mg via INTRAVENOUS
  Filled 2020-11-07 (×5): qty 1

## 2020-11-07 NOTE — TOC Progression Note (Signed)
Transition of Care [facility]) - Progression Note    Patient Details  Name: [name] MRN: [id number] Date of Birth: Jan 23, 1951  Transition of Care [facility] CM/SW Contact  Ross Ludwig, [place] Phone Number: 11/07/2020, 6:00 PM  Clinical Narrative:     CSW requested PT to see patient, will need updated PT notes for insurance authorization.   Expected Discharge Plan: [place] Barriers to Discharge: Continued Medical Work up (Cortrak and tube feedings in place.)  Expected Discharge Plan and Services Expected Discharge Plan: [place] In-house Referral: Clinical Social Work Discharge Planning Services: CM Consult Post Acute Care Choice: [place] arrangements for the past 2 months: Single Family Home                                       Social Determinants of Health (SDOH) Interventions    Readmission Risk Interventions No flowsheet data found.

## 2020-11-07 NOTE — Progress Notes (Signed)
Calorie Count Note  48 hour calorie count ordered.   Diet: Dysphagia 3   Supplements:  -Boost Breeze po daily, each supplement provides 250 kcal and 9 grams of protein -Ensure Enlive po BID, each supplement provides 350 kcal and 20 grams of protein -Prosource Plus PO BID, each provides 100 kcals and 15g protein -Magic cup TID with meals, each supplement provides 290 kcal and 9 grams of protein -[employer] with whole milk TID.  4/26: Breakfast: 530 kcals, 19g protein Lunch: 890 kcals, 44g protein Dinner: 450 kcals, 19g protein Supplements: 1300 kcals, 73g protein (Ensure x 2, Boost x 2, Prosource x 1)  Total intake: 3170 kcal (158% of maximum estimated needs)  155g protein (155% of maximum estimated needs)  Nutrition Dx: Severe Malnutrition related to chronic illness (small cell carcinoma of the lung) as evidenced by severe muscle depletion,severe fat depletion. - ongoing   Goal: Pt to meet >/= 90% of their estimated nutrition needs  -Meeting  Intervention:  -Continue Ensure Enlive BID, Prosource BID -D/c Boost, Magic cup -D/c Calorie Count as pt is eating very well >150% of estimated needs met  Clayton Bibles, MS, RD, LDN Inpatient Clinical Dietitian Contact information available via Amion

## 2020-11-07 NOTE — Progress Notes (Signed)
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  He developed respiratory failure with hypoxia requiring intubation from 3/31-4/6.  Started on Unasyn on 4/12 for possible aspiration pneumonia.   Patient had worsening mental status.  MRI of his brain on 4/16 showed newly diagnosed metastatic disease, and he was transferred to [facility] for palliative radiation therapy by radiation oncology.  Patient had dysphagia-3.  PEG tube was entertained at one point but he eventually made adequate caloric intake.   Therapy recommended SNF.  Subjective: Seen and examined earlier this morning.  No major events overnight or this morning.  No complaints.  He denies chest pain, dyspnea, GI or UTI symptoms.  Objective: Vitals:   11/07/20 0420 11/07/20 0422 11/07/20 0820 11/07/20 1223  BP: (!) 116/53   113/62  Pulse: 61   63  Resp: 18   14  Temp: 98.1 F (36.7 C)   97.6 F (36.4 C)  TempSrc: Oral   Oral  SpO2: 96%  97% 97%  Weight:  62.3 kg    Height:        Intake/Output Summary (Last 24 hours) at 11/07/2020 1315 Last data filed at 11/07/2020 0400 Gross per 24 hour  Intake --  Output 1100 ml  Net -1100 ml   Filed Weights   11/03/20 0605 11/04/20 0422 11/07/20 0422  Weight: 60.3 kg 62.1 kg 62.3 kg    Examination:  GENERAL: No apparent distress.  Nontoxic. HEENT: MMM.  Vision and hearing grossly intact.  NECK: Supple.  No apparent JVD.  RESP: On RA.  No IWOB.  Fair aeration bilaterally. CVS:  RRR. Heart sounds normal.  ABD/GI/GU: BS+. Abd soft, NTND.  MSK/EXT:  Moves  extremities. No apparent deformity. No edema.  SKIN: no apparent skin lesion or wound NEURO: Awake, alert and oriented appropriately.  No apparent focal neuro deficit. PSYCH: Calm. Normal affect.  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 Delton Coombes -S/p chemotherapyLurbinectedin last cycle 09/12/20. -On palliative radiation therapy per radiation oncology-started on 4/17 -Has been on IV Decadron 4 mg every 6 hours.  We may need to wean him from this  Acute respiratory failure with hypoxia/Aspiration pneumonia/mucus plugging/likely history of emphysema requiring intubation/PPV: Resolved. -Completed 7 days of IV cefepime on 4/5 and 5 days of IV Unasyn on 4/17 -Continue aspiration precaution and supportive care  Oropharyngeal dysphagia-SLP recommended dysphagia 3 diet -Continue dysphagia 3 diet  Orthostatic hypotension -Continue TED hose  Essential HTN -On p.o. metoprolol 12.5 mg twice daily  NSVT -Continue metoprolol 12.5 mg twice daily  Nausea/vomiting/diarrhea: Resolved.  Acute metabolic encephalopathy: Multifactorial but seems to have resolved. -Continue reduced dose of gabapentin -Treat treatable causes -Reorientation and delirium precautions  Accidental fall at home Left facial fracture due to accidental fall L3 transverse process fracture due to accidental fall Left femoral neck  fracture due to accidental fall  -S/P left femoral neck hemiarthroplasty -Continue therapy  Normocytic Anemia, likely chronic anemia of chronic disease: H&H stable Recent Labs    10/28/20 0854  10/29/20 0341 10/31/20 0345 11/01/20 0407 11/02/20 0351 11/03/20 0442 11/04/20 0304 11/05/20 0439 11/06/20 0454 11/07/20 0418  HGB 8.9* 8.7* 9.0* 9.3* 9.6* 10.0* 10.3* 9.8* 9.4* 9.5*  -Transfused 1 unit PRBC on 4/6 -Monitor intermittently  Hyperglycemia: Likely due to steroid. -Continue SSI for TF feed coverage -Check hemoglobin A1c  Rhabdomyolysis, POA: Resolved  AKI: Resolved  Thrombocytopenia: Resolved  GERD -Famotidine 20 mg po BID   Tobacco use disorder -Smoking Cessasstion counseling given  -C/w Nicotine Patch 14 mg TD Daily    Severe protein calorie malnutrition in the setting of acute on chronic illness and dysphagia -Oral intake improved.  Meeting adequate caloric intake orally Body mass index is 20.28 kg/m. Nutrition Problem: Severe Malnutrition Etiology: chronic illness (small cell carcinoma of the lung) Signs/Symptoms: severe muscle depletion,severe fat depletion Interventions: Ensure Enlive (each supplement provides 350kcal and 20 grams of protein),Boost Breeze,Magic cup,Prostat,Carnation Instant Breakfast   Sacral pressure ulcer, POA Pressure Injury 10/11/20 Sacrum Medial Deep Tissue Pressure Injury - Purple or maroon localized area of discolored intact skin or blood-filled blister due to damage of underlying soft tissue from pressure and/or shear. (Active)  10/11/20 0220  Location: Sacrum  Location Orientation: Medial  Staging: Deep Tissue Pressure Injury - Purple or maroon localized area of discolored intact skin or blood-filled blister due to damage of underlying soft tissue from pressure and/or shear.  Wound Description (Comments):   Present on Admission: Yes     Pressure Injury 10/15/20 Heel Right Deep Tissue Pressure Injury - Purple or maroon localized area of discolored intact skin or blood-filled blister due to damage of underlying soft tissue from pressure and/or shear. (Active)  10/15/20 0323  Location: Heel  Location Orientation: Right   Staging: Deep Tissue Pressure Injury - Purple or maroon localized area of discolored intact skin or blood-filled blister due to damage of underlying soft tissue from pressure and/or shear.  Wound Description (Comments):   Present on Admission: No     Pressure Injury 10/27/20 Knee Anterior;Right;Lateral Deep Tissue Pressure Injury - Purple or maroon localized area of discolored intact skin or blood-filled blister due to damage of underlying soft tissue from pressure and/or shear. (Active)  10/27/20 1941  Location: Knee  Location Orientation: Anterior;Right;Lateral  Staging: Deep Tissue Pressure Injury - Purple or maroon localized area of discolored intact skin or blood-filled blister due to damage of underlying soft tissue from pressure and/or shear.  Wound Description (Comments):   Present on Admission: Yes   DVT prophylaxis:  Place TED hose Start: 10/25/20 1129 heparin injection 5,000 Units Start: 10/15/20 1400 SCDs Start: 10/10/20 2131  Code Status: DNR/DNI Family Communication: Patient and/or RN. Available if any question.  Level of care: Progressive  Status is: Inpatient  Remains inpatient appropriate because:Unsafe d/c plan   Dispo:  Patient From:  Home  Planned Disposition: To be determined  Medically stable for discharge:           Consultants:  Oncology Orthopedic surgery Radiation oncology Palliative medicine   Sch Meds:  Scheduled Meds: . (feeding supplement) PROSource Plus  30 mL Oral BID BM  . arformoterol  15 mcg Nebulization BID  . aspirin  81 mg Oral Daily  . chlorhexidine  15 mL Mouth Rinse BID  . Chlorhexidine Gluconate Cloth  6 each Topical Daily  . dexamethasone (DECADRON)  injection  4 mg Intravenous Q6H  . diphenoxylate-atropine  5 mL Oral Daily  . doxazosin  2 mg Oral Daily  . famotidine  20 mg Oral BID  . feeding supplement  237 mL Oral BID BM  . gabapentin  100 mg Oral Q8H  . heparin injection (subcutaneous)  5,000 Units Subcutaneous Q8H   . insulin aspart  0-9 Units Subcutaneous Q4H  . mouth rinse  15 mL Mouth Rinse q12n4p  . melatonin  5 mg Oral QHS  . metoprolol tartrate  12.5 mg Oral BID  . mirtazapine  30 mg Oral QHS  . nicotine  14 mg Transdermal Daily  . revefenacin  175 mcg Nebulization Daily  . sodium chloride flush  10-40 mL Intracatheter Q12H  . sodium chloride flush  10-40 mL Intracatheter Q12H   Continuous Infusions: . sodium chloride Stopped (10/25/20 0829)  . sodium chloride 50 mL/hr at 11/05/20 2129   PRN Meds:.sodium chloride, acetaminophen **OR** acetaminophen, albuterol, ALPRAZolam, hydrALAZINE, ondansetron (ZOFRAN) IV, polyethylene glycol, Resource ThickenUp Clear, sodium chloride flush, sodium chloride flush, sodium chloride flush  Antimicrobials: Anti-infectives (From admission, onward)   Start     Dose/Rate Route Frequency Ordered Stop   10/23/20 1330  Ampicillin-Sulbactam (UNASYN) 3 g in sodium chloride 0.9 % 100 mL IVPB        3 g 200 mL/hr over 30 Minutes Intravenous Every 6 hours 10/23/20 1241 10/28/20 2359   10/15/20 2200  ceFEPIme (MAXIPIME) 2 g in sodium chloride 0.9 % 100 mL IVPB        2 g 200 mL/hr over 30 Minutes Intravenous Every 12 hours 10/15/20 1121 10/16/20 2332   10/15/20 0400  vancomycin (VANCOREADY) IVPB 1500 mg/300 mL  Status:  Discontinued        1,500 mg 150 mL/hr over 120 Minutes Intravenous Every 24 hours 10/15/20 0300 10/15/20 1324   10/12/20 1254  vancomycin (VANCOCIN) powder  Status:  Discontinued          As needed 10/12/20 1255 10/12/20 1328   10/11/20 2100  vancomycin (VANCOREADY) IVPB 1250 mg/250 mL  Status:  Discontinued        1,250 mg 166.7 mL/hr over 90 Minutes Intravenous Every 24 hours 10/10/20 2104 10/12/20 0834   10/11/20 1800  ceFEPIme (MAXIPIME) 2 g in sodium chloride 0.9 % 100 mL IVPB  Status:  Discontinued        2 g 200 mL/hr over 30 Minutes Intravenous Every 8 hours 10/11/20 1230 10/15/20 1121   10/11/20 1200  ceFAZolin (ANCEF) IVPB 2g/100 mL  premix        2 g 200 mL/hr over 30 Minutes Intravenous On call to O.R. 10/11/20 1114 10/12/20 0559   10/10/20 2100  vancomycin (VANCOREADY) IVPB 1250 mg/250 mL        1,250 mg 166.7 mL/hr over 90 Minutes Intravenous  Once 10/10/20 2058 10/10/20 2327   10/10/20 2100  ceFEPIme (MAXIPIME) 2 g in sodium chloride 0.9 % 100 mL IVPB  Status:  Discontinued        2 g 200 mL/hr over 30 Minutes Intravenous Every 12 hours 10/10/20 2058 10/11/20 1230       I have personally reviewed the following labs and images: CBC: Recent Labs  Lab 11/03/20 0442 11/04/20 0304 11/05/20 0439 11/06/20 0454 11/07/20 0418  WBC 7.2 8.2 8.9 8.3 11.2*  NEUTROABS 5.8 7.3 7.8* 7.2 9.7*  HGB 10.0* 10.3* 9.8* 9.4* 9.5*  HCT 31.1* 31.6* 30.1* 29.7* 29.8*  MCV 99.7 98.1 98.0 99.7  99.0  PLT 275 272 225 208 188   BMP &GFR Recent Labs  Lab 11/03/20 0442 11/04/20 0304 11/05/20 0439 11/06/20 0454 11/07/20 0418  NA 136 136 135 136 139  K 3.4* 4.1 4.3 4.2 4.1  CL 105 106 104 104 108  CO2 23 24 25 26 24   GLUCOSE 139* 129* 134* 125* 145*  BUN 28* 30* 31* 31* 42*  CREATININE 0.70 0.70 0.75 0.75 0.81  CALCIUM 8.7* 8.8* 8.8* 8.5* 8.7*  MG 2.0 2.1 2.0 2.1 2.0  PHOS 2.8 3.0 3.0 3.5 2.7   Estimated Creatinine Clearance: 75.8 mL/min (by C-G formula based on SCr of 0.81 mg/dL). Liver & Pancreas: Recent Labs  Lab 11/03/20 0442 11/04/20 0304 11/05/20 0439 11/06/20 0454 11/07/20 0418  AST 12* 12* 14* 12* 18  ALT 20 18 16 17 25   ALKPHOS 73 78 72 61 74  BILITOT <0.1* 0.5 0.5 0.4 0.3  PROT 5.7* 6.1* 5.8* 5.4* 4.9*  ALBUMIN 3.0* 3.3* 3.0* 3.0* 2.7*   No results for input(s): LIPASE, AMYLASE in the last 168 hours. No results for input(s): AMMONIA in the last 168 hours. Diabetic: No results for input(s): HGBA1C in the last 72 hours. Recent Labs  Lab 11/06/20 2014 11/06/20 2344 11/07/20 0415 11/07/20 0749 11/07/20 1152  GLUCAP 139* 187* 157* 137* 172*   Cardiac Enzymes: No results for input(s): CKTOTAL,  CKMB, CKMBINDEX, TROPONINI in the last 168 hours. No results for input(s): PROBNP in the last 8760 hours. Coagulation Profile: No results for input(s): INR, PROTIME in the last 168 hours. Thyroid Function Tests: No results for input(s): TSH, T4TOTAL, FREET4, T3FREE, THYROIDAB in the last 72 hours. Lipid Profile: No results for input(s): CHOL, HDL, LDLCALC, TRIG, CHOLHDL, LDLDIRECT in the last 72 hours. Anemia Panel: No results for input(s): VITAMINB12, FOLATE, FERRITIN, TIBC, IRON, RETICCTPCT in the last 72 hours. Urine analysis:    Component Value Date/Time   COLORURINE AMBER (A) 10/10/2020 1610   APPEARANCEUR HAZY (A) 10/10/2020 1610   LABSPEC 1.029 10/10/2020 1610   PHURINE 5.0 10/10/2020 1610   GLUCOSEU NEGATIVE 10/10/2020 1610   HGBUR LARGE (A) 10/10/2020 1610   BILIRUBINUR NEGATIVE 10/10/2020 1610   KETONESUR 20 (A) 10/10/2020 1610   PROTEINUR 100 (A) 10/10/2020 1610   NITRITE NEGATIVE 10/10/2020 1610   LEUKOCYTESUR NEGATIVE 10/10/2020 1610   Sepsis Labs: Invalid input(s): PROCALCITONIN, [place]  Microbiology: No results found for this or any previous visit (from the past 240 hour(s)).  Radiology Studies: No results found.    Kaziyah Parkison T. [place]  If 7PM-7AM, please contact night-coverage [email or web address] 11/07/2020, 1:15 PM

## 2020-11-07 NOTE — Progress Notes (Signed)
Foley d/c'd as order pt due to void will monitor. SRP,RN

## 2020-11-07 NOTE — Progress Notes (Signed)
Pt ate 75% of evening meal - leaving broccoli due to it being difficult to chew and swallow.    Pt later drank a Boost and and Ensure.    Will continue to monitor.

## 2020-11-07 NOTE — Progress Notes (Signed)
Calorie Count: DAY 2  Breakfast: 1 Sausage patties 100%                    2 pancakes 100%                    1/2 bowl Breakfast Potatoes                    1 muffin 100%                     8 oz. Yogurt 100%                      Milk 240 ml  100%          2 cups coffee           1 cup Hot Chocolate                       SNACK:  Ensure- Vanilla                   Resource- [employer] per [employer]  Lunch:     SNACK:                   Dinner:

## 2020-11-08 ENCOUNTER — Ambulatory Visit: Payer: Medicare HMO

## 2020-11-08 ENCOUNTER — Ambulatory Visit
Admit: 2020-11-08 | Discharge: 2020-11-08 | Disposition: A | Discharge status: Home / Self Care | Payer: Medicare HMO | Attending: Radiation Oncology | Admitting: Radiation Oncology

## 2020-11-08 LAB — GLUCOSE, CAPILLARY
Glucose-Capillary: 134 mg/dL — ABNORMAL HIGH (ref 70–99)
Glucose-Capillary: 156 mg/dL — ABNORMAL HIGH (ref 70–99)
Glucose-Capillary: 156 mg/dL — ABNORMAL HIGH (ref 70–99)
Glucose-Capillary: 157 mg/dL — ABNORMAL HIGH (ref 70–99)
Glucose-Capillary: 191 mg/dL — ABNORMAL HIGH (ref 70–99)
Glucose-Capillary: 94 mg/dL (ref 70–99)

## 2020-11-08 LAB — PHOSPHORUS: Phosphorus: 2.5 mg/dL (ref 2.5–4.6)

## 2020-11-08 LAB — MAGNESIUM: Magnesium: 1.9 mg/dL (ref 1.7–2.4)

## 2020-11-08 LAB — SARS CORONAVIRUS 2 (TAT 6-24 HRS): SARS Coronavirus 2: NEGATIVE

## 2020-11-08 MED ORDER — METOPROLOL TARTRATE 25 MG PO TABS: 12.5000 mg | ORAL_TABLET | Freq: Two times a day (BID) | ORAL | 0 refills | Status: AC

## 2020-11-08 MED ORDER — DEXAMETHASONE 4 MG PO TABS: ORAL_TABLET | ORAL | 0 refills | Status: AC

## 2020-11-08 MED ORDER — DIPHENOXYLATE-ATROPINE 2.5-0.025 MG/5ML PO LIQD: 5.0000 mL | Freq: Every day | ORAL | 0 refills | Status: AC

## 2020-11-08 MED ORDER — PROSOURCE PLUS PO LIQD: 30.0000 mL | Freq: Two times a day (BID) | ORAL | Status: AC

## 2020-11-08 MED ORDER — ALPRAZOLAM 1 MG PO TABS: 1.0000 mg | ORAL_TABLET | Freq: Three times a day (TID) | ORAL | 0 refills | Status: AC | PRN

## 2020-11-08 MED ORDER — STIOLTO RESPIMAT 2.5-2.5 MCG/ACT IN AERS: 1.0000 | INHALATION_SPRAY | Freq: Every day | RESPIRATORY_TRACT | Status: AC

## 2020-11-08 MED ORDER — OXYCODONE-ACETAMINOPHEN 5-325 MG PO TABS: 1.0000 | ORAL_TABLET | Freq: Three times a day (TID) | ORAL | 0 refills | Status: AC | PRN

## 2020-11-08 MED ORDER — NICOTINE 14 MG/24HR TD PT24: 14.0000 mg | MEDICATED_PATCH | Freq: Every day | TRANSDERMAL | 0 refills | Status: AC

## 2020-11-08 MED ORDER — IPRATROPIUM-ALBUTEROL 0.5-2.5 (3) MG/3ML IN SOLN: 3.0000 mL | Freq: Four times a day (QID) | RESPIRATORY_TRACT | Status: AC | PRN

## 2020-11-08 NOTE — Plan of Care (Signed)
  Problem: Activity: Goal: Risk for activity intolerance will decrease Outcome: Progressing   Problem: Nutrition: Goal: Adequate nutrition will be maintained Outcome: Progressing   

## 2020-11-08 NOTE — Progress Notes (Signed)
Daily Progress Note   Patient Name: [name]       Date: 11/08/2020 DOB: 21-Oct-1950  Age: 70 y.o. MRN#: [id number] Attending Physician: Mercy Riding, MD Primary Care Physician: [name], MD Admit Date: 10/10/2020  Reason for Consultation/Follow-up: Establishing goals of care  Subjective: Awake alert resting in bed, on dysphagia 3 diet and PO intake is good   Length of Stay: 29  Current Medications: Scheduled Meds:  . (feeding supplement) PROSource Plus  30 mL Oral BID BM  . arformoterol  15 mcg Nebulization BID  . aspirin  81 mg Oral Daily  . chlorhexidine  15 mL Mouth Rinse BID  . Chlorhexidine Gluconate Cloth  6 each Topical Daily  . dexamethasone (DECADRON) injection  4 mg Intravenous Q8H  . diphenoxylate-atropine  5 mL Oral Daily  . doxazosin  2 mg Oral Daily  . famotidine  20 mg Oral BID  . feeding supplement  237 mL Oral BID BM  . gabapentin  100 mg Oral Q8H  . heparin injection (subcutaneous)  5,000 Units Subcutaneous Q8H  . insulin aspart  0-9 Units Subcutaneous Q4H  . mouth rinse  15 mL Mouth Rinse q12n4p  . melatonin  5 mg Oral QHS  . metoprolol tartrate  12.5 mg Oral BID  . mirtazapine  30 mg Oral QHS  . nicotine  14 mg Transdermal Daily  . revefenacin  175 mcg Nebulization Daily  . sodium chloride flush  10-40 mL Intracatheter Q12H  . sodium chloride flush  10-40 mL Intracatheter Q12H    Continuous Infusions: . sodium chloride Stopped (10/25/20 0829)  . sodium chloride 50 mL/hr at 11/05/20 2129    PRN Meds: sodium chloride, acetaminophen **OR** acetaminophen, albuterol, ALPRAZolam, hydrALAZINE, ondansetron (ZOFRAN) IV, polyethylene glycol, Resource ThickenUp Clear, sodium chloride flush, sodium chloride flush, sodium chloride flush  Physical  Exam         Awake alert Is thin and has muscle wasting Regular work of breathing S 1 S 2  Abdomen not distended No edema  Vital Signs: BP 132/85 (BP Location: Left Arm)   Pulse 62   Temp 98.3 F (36.8 C) (Oral)   Resp 19   Ht 5\' 9"  (1.753 m)   Wt 62.9 kg   SpO2 94%   BMI 20.48 kg/m  SpO2: SpO2:  94 % O2 Device: O2 Device: Room Air O2 Flow Rate: O2 Flow Rate (L/min): 0 L/min  Intake/output summary:   Intake/Output Summary (Last 24 hours) at 11/08/2020 1119 Last data filed at 11/08/2020 0858 Gross per 24 hour  Intake 240 ml  Output 3225 ml  Net -2985 ml   LBM: Last BM Date: 11/07/20 Baseline Weight: Weight: 66.7 kg Most recent weight: Weight: 62.9 kg       Palliative Assessment/Data: PPS: 50%    Flowsheet Rows   Flowsheet Row Most Recent Value  Intake Tab   Referral Department Hospitalist  Unit at Time of Referral Intermediate Care Unit  Date Notified 10/20/20  Palliative Care Type New Palliative care  Reason for referral Clarify Goals of Care  Date of Admission 10/10/20  Date first seen by Palliative Care 10/21/20  # of days Palliative referral response time 1 Day(s)  # of days IP prior to Palliative referral 10  Clinical Assessment   Psychosocial & Spiritual Assessment   Palliative Care Outcomes       Patient Active Problem List   Diagnosis Date Noted  . Primary malignant neoplasm of lung with metastasis to brain ([place]) 10/27/2020  . Essential hypertension 10/27/2020  . Thrombocytopenia ([place]) 10/27/2020  . Tobacco abuse 10/27/2020  . Sacral pressure ulcer 10/25/2020  . Orthostatic hypotension 10/25/2020  . Oropharyngeal dysphagia   . Status post total replacement of left hip   . Non-traumatic rhabdomyolysis   . Fall   . Left-sided weakness   . Acute respiratory failure with hypoxia ([place])   . Protein-calorie malnutrition, severe 10/16/2020  . Closed fracture of left hip ([place])   . Pressure injury of skin 10/11/2020  . Sepsis without septic shock  ([place])   . Left displaced femoral neck fracture ([place])   . Delirium   . Closed fracture of frontal sinus ([place])   . Closed fracture of transverse process of lumbar vertebra ([place])   . Goals of care, counseling/discussion 12/19/2019  . Iron deficiency anemia 09/12/2019  . Small cell lung cancer in adult [facility]) 02/05/2017  . Lymphadenopathy of head and neck 01/22/2017  . Osteopenia 03/30/2015  . Clubbing of fingers 04/14/2014  . Hyperglycemia 10/20/2013  . Smoker   . Closed dislocation of acromioclavicular joint 04/11/2010  . BREAST MASS, LEFT 01/20/2008  . ONYCHOMYCOSIS, TOENAILS 10/20/2007  . MUSCLE CRAMPS 10/20/2007  . Hypercholesterolemia 05/28/2007  . CATARACT NOS 08/28/2006  . DISTURBANCE, VISUAL NOS 08/19/2006  . COPD (chronic obstructive pulmonary disease) with emphysema ([place]) 08/19/2006  . WITHDRAWAL, DRUG 06/24/2006  . ANXIETY 06/24/2006  . DEPRESSION 06/24/2006  . MYOCARDIAL INFARCTION, HX OF 06/24/2006  . Coronary atherosclerosis 06/24/2006  . CARDIAC ARRHYTHMIA 06/24/2006  . CONSTIPATION 06/24/2006  . OSTEOARTHRITIS 06/24/2006  . LOW BACK PAIN 06/24/2006  . 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 He remains confused. Has significant dysphagia and is currently NPO with coretrak in place. Palliative medicine consulted for goals of care for this very ill man with multiple comorbidities who is at high risk of decompensation and dying.  Assessment/Recommendations/Plan -Patient remains invested in continuing with completing radiation, discussing  further with  his oncologist about next steps with regards to chemotherapy.  Wishes to increase his oral intake not really in favor of PEG tube  Continue current mode of care Recommend skilled nursing facility with palliative care to follow-up on discharge, discussed with TRH MD.   Goals of Care and Additional Recommendations: Limitations on Scope of Treatment: Full Scope Treatment  Code Status: DNR  Prognosis:  guarded.   Discharge Planning: To Be Determined  Care plan was discussed with IDT  Thank you for allowing the Palliative Medicine Team to assist in the care of this patient.   Total time: 15 minutes Greater than 50%  of this time was spent counseling and coordinating care related to the above assessment and plan.   Loistine Chance, MD [place] Team [phone]  Please contact Palliative Medicine Team phone at [phone] for questions and concerns.

## 2020-11-08 NOTE — Progress Notes (Signed)
   11/08/20 2239  Provider Notification  Provider Name/Title T. Opyd MD  Date Provider Notified 11/08/20  Time Provider Notified 2239  Notification Type Page  Notification Reason Other (Comment) (Tele report of 5 beat run of v-tach, patient is asymptomatic.)  Provider response No new orders

## 2020-11-08 NOTE — TOC Progression Note (Addendum)
Transition of Care [facility]) - Progression Note    Patient Details  Name: [name] MRN: [id number] Date of Birth: 08/03/1950  Transition of Care [facility]) CM/SW [place], [place] Phone Number: 11/08/2020, 12:11 PM  Clinical Narrative:   Patient chooses [name].  Insurance authorization initiated.  Bernadene Bell took information, states patient insurance looks like it may have termed.  They will get back with me to confirm.  No clinicals sent yet. TOC will continue to follow during the course of hospitalization.  Addendum:  Called [employer] back after not hearing from them.  Found out insurance is not termed, but [employer] does not manage.  Informed Ebony Hail at [facility] who will initiate auth request yet today.     Expected Discharge Plan: [place] Barriers to Discharge: Continued Medical Work up (Cortrak and tube feedings in place.)  Expected Discharge Plan and Services Expected Discharge Plan: [place] In-house Referral: Clinical Social Work Discharge Planning Services: CM Consult Post Acute Care Choice: [place] arrangements for the past 2 months: Single Family Home Expected Discharge Date: 11/08/20                                     Social Determinants of Health (SDOH) Interventions    Readmission Risk Interventions No flowsheet data found.

## 2020-11-08 NOTE — Care Management Important Message (Signed)
Important Message  Patient Details IM Letter given to the Patient. Name: [name] MRN: [id number] Date of Birth: Apr 06, 1951   Medicare Important Message Given:  Yes     Kerin Salen 11/08/2020, 11:25 AM

## 2020-11-08 NOTE — Progress Notes (Signed)
Physical Therapy Treatment Patient Details Name: [name] MRN: [id number] DOB: January 19, 1951 Today's Date: 11/08/2020    History of Present Illness Pt is 70 yo admitted 3/30 after fall and found by brother with unknown down time (1-3 days). Fractured L frontal sinus,L L3 transverse process, displaced angulated mildly comminuted L femoral neck fx. AKI with rhabdo. 3/31 pt with hypoxia requiring intubation, extubated 4/1. Pt s/p left anterior hip hemiarthroplasty 4/1. Re-intubated 4/4 for hypoxemia and transfered to ICU, extubated 10/17/20. Progress limited by orthostasis. 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 Recently returned from radiation. Patient  Moved both legs to bed edge. Assist with trunk to sit up. P[atient reaching for foot board for stability/dizziness. Placed RW  In front and patient  Able to sit near midline.  Patient stood  At [facility] with min steady assistance, noted posterior  Bias. Patient able to take small side steps to  Move to recliner.  patient reports  Minimal  Discomfort  In left hip.. Patient performed active Left  Hip surgery exercises without  Any assistance.  Follow Up Recommendations  SNF;Supervision/Assistance - 24 hour     Equipment Recommendations  Rolling walker with 5" wheels;3in1 (PT)    Recommendations for Other Services       Precautions / Restrictions Precautions Precautions: Fall Restrictions Weight Bearing Restrictions: Yes LLE Weight Bearing: Weight bearing as tolerated    Mobility  Bed Mobility Overal bed mobility: Needs Assistance Bed Mobility: Supine to Sit      Supine to sit: HOB elevated;Min assist     General bed mobility comments: patient immediately reaching for foot board for stability upon sitting up    Transfers Overall transfer level: Needs assistance Equipment used: Rolling walker (2 wheeled) Transfers: Sit to/from [facility] Sit to Stand: Min assist Stand pivot transfers: Min assist       General transfer comment: patient stood at [facility]. slight posterior bias. Able to take small side steps along bed edge then  4 more  steps to recliner  Ambulation/Gait                 Stairs             Wheelchair Mobility    Modified Rankin (Stroke Patients Only)       Balance Overall balance assessment: Needs assistance;History of Falls Sitting-balance support: Single extremity supported Sitting balance-Leahy Scale: Fair Sitting balance - Comments: with UE support, sitting at midline   Standing balance support: During functional activity;Bilateral upper extremity supported Standing balance-Leahy Scale: Poor Standing balance comment: reliant on UE support and min A for safety                            Cognition Arousal/Alertness: Awake/alert Behavior During Therapy: WFL for tasks assessed/performed Overall Cognitive Status: No family/caregiver present to determine baseline cognitive functioning Area of Impairment: Memory;Following commands;Safety/judgement;Problem solving                   Current Attention Level: Selective           General Comments: patient  participatory after explanation for need for rehab      Exercises General Exercises - Lower Extremity Short Arc Quad: AROM;Left;10 reps Heel Slides: AROM;Left;10 reps Hip ABduction/ADduction: AROM;10 reps;Supine;Left    General Comments        Pertinent Vitals/Pain Faces Pain Scale: Hurts little more Pain Location: left hip - with mobility. Pain Descriptors / Indicators: Grimacing;Guarding Pain  Intervention(s): Monitored during session;Premedicated before session    Home Living                      Prior Function            PT Goals (current goals can now be found in the care plan section) Progress towards PT goals: Progressing toward goals    Frequency    Min 3X/week      PT Plan Current plan remains appropriate    Co-evaluation              AM-PAC PT "6 Clicks" Mobility   Outcome Measure  Help needed turning from your back to your side while in a flat bed without using bedrails?: A Little Help needed moving from lying on your back to sitting on the side of a flat bed without using bedrails?: A Little Help needed moving to and from a bed to a chair (including a wheelchair)?: A Little Help needed standing up from a chair using your arms (e.g., wheelchair or bedside chair)?: A Little Help needed to walk in hospital room?: A Lot Help needed climbing 3-5 steps with a railing? : Total 6 Click Score: 15    End of Session   Activity Tolerance: Patient tolerated treatment well Patient left: in chair;with call bell/phone within reach;with chair alarm set Nurse Communication: Mobility status PT Visit Diagnosis: Other abnormalities of gait and mobility (R26.89);Difficulty in walking, not elsewhere classified (R26.2);Muscle weakness (generalized) (M62.81)     Time: 7408-1448 PT Time Calculation (min) (ACUTE ONLY): 19 min  Charges:  $Therapeutic Activity: 8-22 mins                     Tresa Endo PT Acute Rehabilitation Services Pager [phone] Office [phone]    Claretha Cooper 11/08/2020, 11:34 AM

## 2020-11-08 NOTE — Progress Notes (Signed)
IR consulted by Dr. Alfredia Ferguson for possible image-guided percutaneous gastrostomy tube placement.  Patient currently tolerating dysphagia-3 diet and has made adequate caloric intake PO per Clayton Bibles, RD. Discussed case with Dr. Cyndia Skeeters who no longer requests gastrostomy tube placement for patient. No plans for IR interventions at this time- will delete order.  IR available in future if needed.   Bea Graff Alcide Memoli, PA-C 11/08/2020, 8:44 AM

## 2020-11-08 NOTE — Progress Notes (Signed)
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  He developed respiratory failure with hypoxia requiring intubation from 3/31-4/6.  Started on Unasyn on 4/12 for possible aspiration pneumonia.   Patient had worsening mental status.  MRI of his brain on 4/16 showed newly diagnosed metastatic disease, and he was transferred to [facility] for palliative radiation therapy by radiation oncology.  Patient had dysphagia-3.  PEG tube was entertained at one point but he eventually made adequate caloric intake.   Therapy recommended SNF.  Waiting on insurance authorization.  Subjective: Seen and examined earlier this morning.  No major events overnight or this morning.  No complaints.  He denies chest pain, shortness of breath, GI or UTI symptoms.  Objective: Vitals:   11/07/20 2050 11/08/20 0421 11/08/20 0500 11/08/20 0837  BP: 125/62 132/85    Pulse: 71 62    Resp: 17 19    Temp: 97.6 F (36.4 C) 98.3 F (36.8 C)    TempSrc: Oral Oral    SpO2: 96% 95%  94%  Weight:   62.9 kg   Height:        Intake/Output Summary (Last 24 hours) at 11/08/2020 1206 Last data filed at 11/08/2020 0858 Gross per 24 hour  Intake 240 ml  Output 2325 ml  Net -2085 ml   Filed Weights   11/04/20 0422 11/07/20 0422 11/08/20 0500  Weight: 62.1 kg 62.3 kg 62.9 kg    Examination:  GENERAL: No apparent distress.  Nontoxic. HEENT: MMM.  Vision and hearing grossly intact.  NECK: Supple.  No apparent JVD.  RESP: On RA.  No IWOB.  Fair aeration bilaterally. CVS:  RRR. Heart sounds normal.   ABD/GI/GU: BS+. Abd soft, NTND.  MSK/EXT:  Moves extremities. No apparent deformity. No edema.  SKIN: no apparent skin lesion or wound NEURO: Awake, alert and oriented appropriately.  No apparent focal neuro deficit. PSYCH: Calm. Normal affect.  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 Delton Coombes -S/p chemotherapyLurbinectedin last cycle 09/12/20. -On palliative radiation therapy per radiation oncology-started on 4/17 -Weaned IV Decadron to 4 mg every 8 hours-continue weaning.  Acute respiratory failure with hypoxia/Aspiration pneumonia/mucus plugging/likely history of emphysema requiring intubation/PPV: Resolved. -Completed 7 days of IV cefepime on 4/5 and 5 days of IV Unasyn on 4/17 -Continue aspiration precaution and supportive care -Continue LAMA/LABA with as needed nebulizers. -Outpatient follow-up with pulmonology  Oropharyngeal dysphagia-SLP recommended dysphagia 3 diet -Continue dysphagia 3 diet  Orthostatic hypotension: Seems to have resolved. -Continue TED hose  Essential HTN/NSVT -On p.o. metoprolol 12.5 mg twice daily  Nausea/vomiting/diarrhea: Seems to have resolved. -Continue Bentyl as needed for diarrhea.  Acute metabolic encephalopathy: Multifactorial but seems to have resolved. -Continue reduced dose of gabapentin -Treat treatable causes -Reorientation and delirium precautions  Accidental fall at home Left facial fracture due to  accidental fall L3 transverse process fracture due to accidental fall Left femoral neck fracture due to accidental fall  -S/P left femoral  neck hemiarthroplasty -WBAT -Continue therapy -Outpatient follow-up with orthopedic surgery  Normocytic Anemia, likely chronic anemia of chronic disease: H&H stable Recent Labs    10/28/20 0854 10/29/20 0341 10/31/20 0345 11/01/20 0407 11/02/20 0351 11/03/20 0442 11/04/20 0304 11/05/20 0439 11/06/20 0454 11/07/20 0418  HGB 8.9* 8.7* 9.0* 9.3* 9.6* 10.0* 10.3* 9.8* 9.4* 9.5*  -Transfused 1 unit PRBC on 4/6 -Monitor intermittently  Hyperglycemia: Likely due to steroid. -Continue SSI and CBG monitoring.  We may be able to discontinue this as we wean him off steroid. -Check hemoglobin A1c  Rhabdomyolysis, POA: Resolved  AKI: Resolved  Thrombocytopenia: Resolved  GERD -Famotidine 20 mg po BID   Tobacco use disorder -Smoking Cessasstion counseling given  -C/w Nicotine Patch 14 mg TD Daily    Severe protein calorie malnutrition in the setting of acute on chronic illness and dysphagia -Oral intake improved.  Meeting adequate caloric intake orally Body mass index is 20.48 kg/m. Nutrition Problem: Severe Malnutrition Etiology: chronic illness (small cell carcinoma of the lung) Signs/Symptoms: severe muscle depletion,severe fat depletion Interventions: Ensure Enlive (each supplement provides 350kcal and 20 grams of protein),Boost Breeze,Magic cup,Prostat,Carnation Instant Breakfast   Sacral pressure ulcer, POA Pressure Injury 10/11/20 Sacrum Medial Deep Tissue Pressure Injury - Purple or maroon localized area of discolored intact skin or blood-filled blister due to damage of underlying soft tissue from pressure and/or shear. (Active)  10/11/20 0220  Location: Sacrum  Location Orientation: Medial  Staging: Deep Tissue Pressure Injury - Purple or maroon localized area of discolored intact skin or blood-filled blister due to damage of underlying soft tissue from pressure and/or shear.  Wound Description (Comments):   Present on Admission: Yes     Pressure Injury  10/15/20 Heel Right Deep Tissue Pressure Injury - Purple or maroon localized area of discolored intact skin or blood-filled blister due to damage of underlying soft tissue from pressure and/or shear. (Active)  10/15/20 0323  Location: Heel  Location Orientation: Right  Staging: Deep Tissue Pressure Injury - Purple or maroon localized area of discolored intact skin or blood-filled blister due to damage of underlying soft tissue from pressure and/or shear.  Wound Description (Comments):   Present on Admission: No     Pressure Injury 10/27/20 Knee Anterior;Right;Lateral Deep Tissue Pressure Injury - Purple or maroon localized area of discolored intact skin or blood-filled blister due to damage of underlying soft tissue from pressure and/or shear. (Active)  10/27/20 1941  Location: Knee  Location Orientation: Anterior;Right;Lateral  Staging: Deep Tissue Pressure Injury - Purple or maroon localized area of discolored intact skin or blood-filled blister due to damage of underlying soft tissue from pressure and/or shear.  Wound Description (Comments):   Present on Admission: Yes   DVT prophylaxis:  Place TED hose Start: 10/25/20 1129 heparin injection 5,000 Units Start: 10/15/20 1400 SCDs Start: 10/10/20 2131  Code Status: DNR/DNI Family Communication: Patient and/or RN.  No family update per patient's wish Level of care: Progressive.  Change level of care to MedSurg Status is: Inpatient  Remains inpatient appropriate because:Unsafe d/c plan   Dispo:  Patient From:  Home  Planned Disposition: SNF  medically stable for discharge:  Yes         Consultants:  Oncology Orthopedic surgery Radiation oncology Palliative medicine   Sch Meds:  Scheduled Meds: . (feeding supplement) PROSource Plus  30 mL Oral BID BM  .  arformoterol  15 mcg Nebulization BID  . aspirin  81 mg Oral Daily  . chlorhexidine  15 mL Mouth Rinse BID  . Chlorhexidine Gluconate Cloth  6 each Topical Daily  .  dexamethasone (DECADRON) injection  4 mg Intravenous Q8H  . diphenoxylate-atropine  5 mL Oral Daily  . doxazosin  2 mg Oral Daily  . famotidine  20 mg Oral BID  . feeding supplement  237 mL Oral BID BM  . gabapentin  100 mg Oral Q8H  . heparin injection (subcutaneous)  5,000 Units Subcutaneous Q8H  . insulin aspart  0-9 Units Subcutaneous Q4H  . mouth rinse  15 mL Mouth Rinse q12n4p  . melatonin  5 mg Oral QHS  . metoprolol tartrate  12.5 mg Oral BID  . mirtazapine  30 mg Oral QHS  . nicotine  14 mg Transdermal Daily  . revefenacin  175 mcg Nebulization Daily  . sodium chloride flush  10-40 mL Intracatheter Q12H  . sodium chloride flush  10-40 mL Intracatheter Q12H   Continuous Infusions: . sodium chloride Stopped (10/25/20 0829)  . sodium chloride 50 mL/hr at 11/05/20 2129   PRN Meds:.sodium chloride, acetaminophen **OR** acetaminophen, albuterol, ALPRAZolam, hydrALAZINE, ondansetron (ZOFRAN) IV, polyethylene glycol, Resource ThickenUp Clear, sodium chloride flush, sodium chloride flush, sodium chloride flush  Antimicrobials: Anti-infectives (From admission, onward)   Start     Dose/Rate Route Frequency Ordered Stop   10/23/20 1330  Ampicillin-Sulbactam (UNASYN) 3 g in sodium chloride 0.9 % 100 mL IVPB        3 g 200 mL/hr over 30 Minutes Intravenous Every 6 hours 10/23/20 1241 10/28/20 2359   10/15/20 2200  ceFEPIme (MAXIPIME) 2 g in sodium chloride 0.9 % 100 mL IVPB        2 g 200 mL/hr over 30 Minutes Intravenous Every 12 hours 10/15/20 1121 10/16/20 2332   10/15/20 0400  vancomycin (VANCOREADY) IVPB 1500 mg/300 mL  Status:  Discontinued        1,500 mg 150 mL/hr over 120 Minutes Intravenous Every 24 hours 10/15/20 0300 10/15/20 1324   10/12/20 1254  vancomycin (VANCOCIN) powder  Status:  Discontinued          As needed 10/12/20 1255 10/12/20 1328   10/11/20 2100  vancomycin (VANCOREADY) IVPB 1250 mg/250 mL  Status:  Discontinued        1,250 mg 166.7 mL/hr over 90 Minutes  Intravenous Every 24 hours 10/10/20 2104 10/12/20 0834   10/11/20 1800  ceFEPIme (MAXIPIME) 2 g in sodium chloride 0.9 % 100 mL IVPB  Status:  Discontinued        2 g 200 mL/hr over 30 Minutes Intravenous Every 8 hours 10/11/20 1230 10/15/20 1121   10/11/20 1200  ceFAZolin (ANCEF) IVPB 2g/100 mL premix        2 g 200 mL/hr over 30 Minutes Intravenous On call to O.R. 10/11/20 1114 10/12/20 0559   10/10/20 2100  vancomycin (VANCOREADY) IVPB 1250 mg/250 mL        1,250 mg 166.7 mL/hr over 90 Minutes Intravenous  Once 10/10/20 2058 10/10/20 2327   10/10/20 2100  ceFEPIme (MAXIPIME) 2 g in sodium chloride 0.9 % 100 mL IVPB  Status:  Discontinued        2 g 200 mL/hr over 30 Minutes Intravenous Every 12 hours 10/10/20 2058 10/11/20 1230       I have personally reviewed the following labs and images: CBC: Recent Labs  Lab 11/03/20 0442 11/04/20 0304 11/05/20 0439 11/06/20  0454 11/07/20 0418  WBC 7.2 8.2 8.9 8.3 11.2*  NEUTROABS 5.8 7.3 7.8* 7.2 9.7*  HGB 10.0* 10.3* 9.8* 9.4* 9.5*  HCT 31.1* 31.6* 30.1* 29.7* 29.8*  MCV 99.7 98.1 98.0 99.7 99.0  PLT 275 272 225 208 188   BMP &GFR Recent Labs  Lab 11/03/20 0442 11/04/20 0304 11/05/20 0439 11/06/20 0454 11/07/20 0418 11/08/20 0323  NA 136 136 135 136 139  --   K 3.4* 4.1 4.3 4.2 4.1  --   CL 105 106 104 104 108  --   CO2 23 24 25 26 24   --   GLUCOSE 139* 129* 134* 125* 145*  --   BUN 28* 30* 31* 31* 42*  --   CREATININE 0.70 0.70 0.75 0.75 0.81  --   CALCIUM 8.7* 8.8* 8.8* 8.5* 8.7*  --   MG 2.0 2.1 2.0 2.1 2.0 1.9  PHOS 2.8 3.0 3.0 3.5 2.7 2.5   Estimated Creatinine Clearance: 76.6 mL/min (by C-G formula based on SCr of 0.81 mg/dL). Liver & Pancreas: Recent Labs  Lab 11/03/20 0442 11/04/20 0304 11/05/20 0439 11/06/20 0454 11/07/20 0418  AST 12* 12* 14* 12* 18  ALT 20 18 16 17 25   ALKPHOS 73 78 72 61 74  BILITOT <0.1* 0.5 0.5 0.4 0.3  PROT 5.7* 6.1* 5.8* 5.4* 4.9*  ALBUMIN 3.0* 3.3* 3.0* 3.0* 2.7*   No  results for input(s): LIPASE, AMYLASE in the last 168 hours. No results for input(s): AMMONIA in the last 168 hours. Diabetic: No results for input(s): HGBA1C in the last 72 hours. Recent Labs  Lab 11/07/20 1952 11/07/20 2358 11/08/20 0423 11/08/20 0742 11/08/20 1145  GLUCAP 162* 154* 156* 94 134*   Cardiac Enzymes: No results for input(s): CKTOTAL, CKMB, CKMBINDEX, TROPONINI in the last 168 hours. No results for input(s): PROBNP in the last 8760 hours. Coagulation Profile: No results for input(s): INR, PROTIME in the last 168 hours. Thyroid Function Tests: No results for input(s): TSH, T4TOTAL, FREET4, T3FREE, THYROIDAB in the last 72 hours. Lipid Profile: No results for input(s): CHOL, HDL, LDLCALC, TRIG, CHOLHDL, LDLDIRECT in the last 72 hours. Anemia Panel: No results for input(s): VITAMINB12, FOLATE, FERRITIN, TIBC, IRON, RETICCTPCT in the last 72 hours. Urine analysis:    Component Value Date/Time   COLORURINE AMBER (A) 10/10/2020 1610   APPEARANCEUR HAZY (A) 10/10/2020 1610   LABSPEC 1.029 10/10/2020 1610   PHURINE 5.0 10/10/2020 1610   GLUCOSEU NEGATIVE 10/10/2020 1610   HGBUR LARGE (A) 10/10/2020 1610   BILIRUBINUR NEGATIVE 10/10/2020 1610   KETONESUR 20 (A) 10/10/2020 1610   PROTEINUR 100 (A) 10/10/2020 1610   NITRITE NEGATIVE 10/10/2020 1610   LEUKOCYTESUR NEGATIVE 10/10/2020 1610   Sepsis Labs: Invalid input(s): PROCALCITONIN, [place]  Microbiology: No results found for this or any previous visit (from the past 240 hour(s)).  Radiology Studies: No results found.    Kaybree Williams T. [place]  If 7PM-7AM, please contact night-coverage [email or web address] 11/08/2020, 12:06 PM

## 2020-11-09 ENCOUNTER — Ambulatory Visit: Payer: Medicare HMO

## 2020-11-09 LAB — GLUCOSE, CAPILLARY
Glucose-Capillary: 153 mg/dL — ABNORMAL HIGH (ref 70–99)
Glucose-Capillary: 125 mg/dL — ABNORMAL HIGH (ref 70–99)
Glucose-Capillary: 108 mg/dL — ABNORMAL HIGH (ref 70–99)
Glucose-Capillary: 148 mg/dL — ABNORMAL HIGH (ref 70–99)
Glucose-Capillary: 138 mg/dL — ABNORMAL HIGH (ref 70–99)

## 2020-11-09 LAB — HEMOGLOBIN A1C
Hgb A1c MFr Bld: 5.1 % (ref 4.8–5.6)
Mean Plasma Glucose: 99.67 mg/dL

## 2020-11-09 LAB — PHOSPHORUS: Phosphorus: 3.6 mg/dL (ref 2.5–4.6)

## 2020-11-09 LAB — MAGNESIUM: Magnesium: 2 mg/dL (ref 1.7–2.4)

## 2020-11-09 MED ORDER — DEXAMETHASONE SODIUM PHOSPHATE 4 MG/ML IJ SOLN
4.0000 mg | Freq: Two times a day (BID) | INTRAMUSCULAR | Status: DC
  Administered 2020-11-09 – 2020-11-10 (×2): 4 mg via INTRAVENOUS
  Filled 2020-11-09 (×2): qty 1

## 2020-11-09 NOTE — Discharge Summary (Signed)
Physician Discharge Summary  [name]:[id number] DOB: [birth year] DOA: 10/10/2020  PCP: David Frizzle, MD  Admit date: 10/10/2020 Discharge date: 11/09/2020  Admitted From: Home Disposition: SNF  Recommendations for Outpatient Follow-up:  1. Follow ups as below. 2. Please obtain CBC/BMP/Mag at follow up 3. Please follow up on the following pending results: None   Discharge Condition: Stable CODE STATUS: DNR/DNI   Contact information for follow-up providers    David Seeds, MD Follow up.   Specialty: Pulmonary Disease Why: If you do not have a pulmnologist, please call us to schedule an appointment for inhaler management of your COPD Contact information: [place] [address] [phone]        [facility] Follow up.   Specialty: PALLIATIVE CARE Why: [facility] will be following the patient for outpatient palliative care services. Contact information: [place]       David Koyanagi, MD. Schedule an appointment as soon as possible for a visit in 3 week(s).   Specialty: Orthopedic Surgery Contact information: [place] [address] [phone]            Contact information for after-discharge care    [facility] .   Service: Skilled Nursing Contact information: [address] 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  He developed respiratory failure with hypoxia requiring intubation from 3/31-4/6.  Started on Unasyn on 4/12 for possible aspiration pneumonia.   Patient had worsening mental status.  MRI  of his brain on 4/16 showed newly diagnosed metastatic disease, and he was transferred to [facility] for palliative radiation therapy by radiation oncology.  He was a started on radiation therapy and Decadron 4 mg every 6 hours that was tapered down to 4 mg twice daily at time of discharge.  Patient had dysphagia-3.  PEG tube was entertained at one point but he eventually met adequate caloric intake.   Therapy recommended SNF.    Discharge Diagnoses:  Stage IV small cell lung cancer with mets to brain and liver-followed by Dr. Delton Greer -S/p chemotherapyLurbinectedin last cycle 09/12/20. -On palliative radiation therapy per radiation oncology-started on 4/17 -Decadron 4 mg every 12 hours for 5 days followed by 4 mg daily  Acute respiratory failure with hypoxia/Aspiration pneumonia/mucus plugging/likely history of emphysema requiring intubation/PPV: Resolved. -Completed 7 days of IV cefepime on 4/5 and 5 days of IV Unasyn on 4/17 -Continue aspiration precaution and supportive care -Continue LAMA/LABA (Stiolto) with as needed nebulizers. -He is also on steroid as above. -Outpatient follow-up with pulmonology  Oropharyngeal dysphagia-SLP recommended dysphagia 3 diet -Continue dysphagia 3 diet  Orthostatic hypotension: Seems to have resolved. -Continue TED hose  Essential HTN/NSVT -On p.o. metoprolol 12.5 mg twice daily  Nausea/vomiting/diarrhea: Seems to have resolved. -Continue Bentyl as needed for diarrhea.  Acute metabolic encephalopathy: Multifactorial but seems to have resolved. -Continue reduced dose of gabapentin -Treat treatable causes -Reorientation and delirium precautions  Accidental fall at home Left facial fracture due to accidental fall L3 transverse process fracture due to accidental fall Left femoral neck fracture  due to accidental fall  -S/P left femoral neck hemiarthroplasty -WBAT -Continue therapy -Outpatient follow-up with orthopedic  surgery  Normocytic Anemia, likely chronic anemia of chronic disease: H&H stable Recent Labs    10/28/20 0854 10/29/20 0341 10/31/20 0345 11/01/20 0407 11/02/20 0351 11/03/20 0442 11/04/20 0304 11/05/20 0439 11/06/20 0454 11/07/20 0418  HGB 8.9* 8.7* 9.0* 9.3* 9.6* 10.0* 10.3* 9.8* 9.4* 9.5*  -Transfused 1 unit PRBC on 4/6 -Monitor intermittently  Hyperglycemia: Likely due to steroid.  A1c 5.1%.  Hyperglycemia improving. Recent Labs  Lab 11/08/20 2025 11/08/20 2353 11/09/20 0427 11/09/20 0753 11/09/20 1149  GLUCAP 156* 191* 153* 125* 108*  -Discontinue CBG monitoring and SSI   Rhabdomyolysis, POA: Resolved  AKI: Resolved  Thrombocytopenia: Resolved  Acute urinary retention: Resolved.  GERD -Famotidine 20 mg po BID   Tobacco use disorder -Smoking Cessasstion counseling given  -C/w Nicotine Patch 14 mg TD Daily   Severe protein calorie malnutrition in the setting of acute on chronic illness and dysphagia Body mass index is 20.48 kg/m. Nutrition Problem: Severe Malnutrition Etiology: chronic illness (small cell carcinoma of the lung) Signs/Symptoms: severe muscle depletion,severe fat depletion Interventions: Ensure Enlive (each supplement provides 350kcal and 20 grams of protein),Boost Breeze,Magic cup,Prostat,Carnation Instant Breakfast  Pressure skin injury: POA Pressure Injury 10/11/20 Sacrum Medial Deep Tissue Pressure Injury - Purple or maroon localized area of discolored intact skin or blood-filled blister due to damage of underlying soft tissue from pressure and/or shear. (Active)  10/11/20 0220  Location: Sacrum  Location Orientation: Medial  Staging: Deep Tissue Pressure Injury - Purple or maroon localized area of discolored intact skin or blood-filled blister due to damage of underlying soft tissue from pressure and/or shear.  Wound Description (Comments):   Present on Admission: Yes     Pressure Injury 10/15/20 Heel Right Deep Tissue  Pressure Injury - Purple or maroon localized area of discolored intact skin or blood-filled blister due to damage of underlying soft tissue from pressure and/or shear. (Active)  10/15/20 0323  Location: Heel  Location Orientation: Right  Staging: Deep Tissue Pressure Injury - Purple or maroon localized area of discolored intact skin or blood-filled blister due to damage of underlying soft tissue from pressure and/or shear.  Wound Description (Comments):   Present on Admission: No     Pressure Injury 10/27/20 Knee Anterior;Right;Lateral Deep Tissue Pressure Injury - Purple or maroon localized area of discolored intact skin or blood-filled blister due to damage of underlying soft tissue from pressure and/or shear. (Active)  10/27/20 1941  Location: Knee  Location Orientation: Anterior;Right;Lateral  Staging: Deep Tissue Pressure Injury - Purple or maroon localized area of discolored intact skin or blood-filled blister due to damage of underlying soft tissue from pressure and/or shear.  Wound Description (Comments):   Present on Admission: Yes    Discharge Exam: Vitals:   11/09/20 0620 11/09/20 0817  BP: 120/63   Pulse: 65   Resp: 20   Temp: 97.9 F (36.6 C)   SpO2: 98% 99%    GENERAL: No apparent distress.  Nontoxic. HEENT: MMM.  Vision and hearing grossly intact.  NECK: Supple.  No apparent JVD.  RESP: On RA.  No IWOB.  Fair aeration bilaterally. CVS:  RRR. Heart sounds normal.  ABD/GI/GU: Bowel sounds present. Soft. Non tender.  MSK/EXT:  Moves extremities. No apparent deformity. No edema.  SKIN: no apparent skin lesion or wound NEURO: Awake, alert and oriented appropriately.  No apparent focal neuro deficit. PSYCH: Calm. Normal affect.  Discharge Instructions  Discharge Instructions    Weight bearing as tolerated   Complete by: As directed      Allergies as of 11/09/2020      Reactions   Codeine Nausea Only   Niaspan [niacin Er]       Medication List    STOP  taking these medications   hydrOXYzine 50 MG tablet Commonly known as: ATARAX/VISTARIL     TAKE these medications   (feeding supplement) PROSource Plus liquid Take 30 mLs by mouth 2 (two) times daily between meals.   albuterol 108 (90 Base) MCG/ACT inhaler Commonly known as: VENTOLIN HFA Inhale 2 puffs into the lungs every 6 (six) hours as needed for wheezing or shortness of breath.   ALPRAZolam 1 MG tablet Commonly known as: XANAX Take 1 tablet (1 mg total) by mouth 3 (three) times daily as needed for anxiety. What changed: See the new instructions.   aspirin EC 81 MG tablet Take 81 mg by mouth daily.   atorvastatin 40 MG tablet Commonly known as: LIPITOR TAKE (1) TABLET BY MOUTH ONCE DAILY. What changed: See the new instructions.   calcium-vitamin D 250-125 MG-UNIT tablet Commonly known as: OSCAL WITH D Take 1 tablet by mouth daily.   Centrum Silver 50+Men Tabs Take 1 tablet by mouth daily.   cholecalciferol 25 MCG (1000 UNIT) tablet Commonly known as: VITAMIN D3 Take 1,000 Units by mouth daily.   dexamethasone 4 MG tablet Commonly known as: DECADRON Take 1 tablet (4 mg total) by mouth 2 (two) times daily with a meal for 5 days, THEN 1 tablet (4 mg total) daily for 10 days. Start taking on: November 08, 2020   diphenoxylate-atropine 2.5-0.025 MG/5ML liquid Commonly known as: LOMOTIL Take 5 mLs by mouth daily.   docusate sodium 100 MG capsule Commonly known as: Colace Take two capsules at bedtime starting the day you receive chemotherapy and then for four days after   gabapentin 300 MG capsule Commonly known as: NEURONTIN TAKE (2) CAPSULES BY MOUTH THREE TIMES DAILY. What changed: See the new instructions.   ipratropium-albuterol 0.5-2.5 (3) MG/3ML Soln Commonly known as: DUONEB Take 3 mLs by nebulization every 6 (six) hours as needed.   metoprolol tartrate 25 MG tablet Commonly known as: LOPRESSOR Take 0.5 tablets (12.5 mg total) by mouth 2 (two) times  daily.   mirtazapine 15 MG tablet Commonly known as: Remeron Take 2 tablets (30 mg total) by mouth at bedtime. What changed: Another medication with the same name was removed. Continue taking this medication, and follow the directions you see here.   nicotine 14 mg/24hr patch Commonly known as: NICODERM CQ - dosed in mg/24 hours Place 1 patch (14 mg total) onto the skin daily.   ondansetron 8 MG tablet Commonly known as: Zofran Take 1 tablet (8 mg total) by mouth 2 (two) times daily as needed. Start on the third day after cisplatin chemotherapy.   oxyCODONE-acetaminophen 5-325 MG tablet Commonly known as: PERCOCET/ROXICET Take 1 tablet by mouth every 8 (eight) hours as needed for severe pain.   prochlorperazine 10 MG tablet Commonly known as: COMPAZINE Take 1 tablet (10 mg total) by mouth every 6 (six) hours as needed (Nausea or vomiting).   senna 8.6 MG tablet Commonly known as: SENOKOT Take 3 tablets by mouth daily.   Stiolto Respimat 2.5-2.5 MCG/ACT Aers Generic drug: Tiotropium Bromide-Olodaterol Inhale 1 puff into the lungs daily.   vitamin B-12 100 MCG tablet Commonly known as: CYANOCOBALAMIN Take 100 mcg by mouth daily.  vitamin C 1000 MG tablet Take 1,000 mg by mouth daily.            Discharge Care Instructions  (From admission, onward)         Start     Ordered   10/12/20 0000  Weight bearing as tolerated        10/12/20 1309          Consultations:  Oncology  Orthopedic surgery  Radiation oncology  Palliative medicine  Procedures/Studies:  Intubation and mechanical ventilation from 3/31-4/6   CT ABDOMEN WO CONTRAST  Result Date: 11/03/2020 CLINICAL DATA:  Evaluate anatomy for gastrostomy tube placement EXAM: CT ABDOMEN WITHOUT CONTRAST TECHNIQUE: Multidetector CT imaging of the abdomen was performed following the standard protocol without IV contrast. COMPARISON:  CT abdomen pelvis with contrast 10/10/2020 FINDINGS: Lower chest:  Unchanged elevation of the left hemidiaphragm. Increasing opacity in the left lower lobe suspicious for progressive pneumonia. Multiple nodules again seen in the right lower lobe. Hepatobiliary: No focal liver abnormality is seen. No gallstones, gallbladder wall thickening, or biliary dilatation. Pancreas: Unremarkable. No pancreatic ductal dilatation or surrounding inflammatory changes. Spleen: Normal in size without focal abnormality. Adrenals/Urinary Tract: 3.2 cm simple cyst seen in the upper pole of the left kidney. 2 cm simple cyst seen in the anterolateral cortex of the left renal upper pole. Kidneys and ureters otherwise unremarkable Stomach/Bowel: Anatomy is amenable to percutaneous gastrostomy tube placement. No dilated loops of bowel to indicate ileus or obstruction within the visualized abdomen. Vascular/Lymphatic: Diffuse atherosclerotic calcifications seen throughout the abdominal aorta without aneurysmal dilatation. No enlarged abdominal lymph nodes. Other: No abdominal wall hernia or abnormality. Musculoskeletal: No acute or significant osseous findings. IMPRESSION: 1. Anatomy amenable to percutaneous gastrostomy tube placement. 2. Multiple right lower lobe pulmonary nodules again seen. Follow-up chest CT recommended in 3 months. Electronically Signed   By: Miachel Roux M.D.   On: 11/03/2020 09:29   CT Angio Head W or Wo Contrast  Result Date: 10/10/2020 CLINICAL DATA:  Fall. History of extensive stage small cell lung cancer. EXAM: CT ANGIOGRAPHY HEAD AND NECK TECHNIQUE: Multidetector CT imaging of the head and neck was performed using the standard protocol during bolus administration of intravenous contrast. Multiplanar CT image reconstructions and MIPs were obtained to evaluate the vascular anatomy. Carotid stenosis measurements (when applicable) are obtained utilizing NASCET criteria, using the distal internal carotid diameter as the denominator. CONTRAST:  29m OMNIPAQUE IOHEXOL 350 MG/ML  SOLN COMPARISON:  Head, cervical spine, and maxillofacial CTs earlier today. Neck CT 09/03/2020. No prior angiographic imaging. FINDINGS: CTA NECK FINDINGS Aortic arch: Standard 3 vessel aortic arch with mild atherosclerotic plaque. No significant arch vessel origin stenosis. Right carotid system: Patent with soft and calcified plaque in the carotid bulb. 65% stenosis of the ICA at the distal aspect of the bulb. Left carotid system: Patent with predominantly calcified plaque in the carotid bulb. No evidence of significant stenosis or dissection. Vertebral arteries: Patent without evidence of dissection or a significant stenosis on the left. Calcified plaque at the right vertebral artery origin results in moderate stenosis. Skeleton: Mild compression fractures in the upper thoracic spine, also present on a chest CT last month. Severe facet arthrosis on the left at C2-3 and on the right at C3-4 with trace anterolisthesis at the latter. Fracture involving the anterior wall of the left frontal sinus as described on earlier maxillofacial CT. Other neck: 1.3 cm short axis superficial lymph node along the right lower neck and 1.2 cm  short axis left level V lymph node as noted on today's earlier cervical spine CT, both larger than on the 09/03/2020 neck CT. 1.9 x 1.3 cm low-density focus in the left nasopharyngeal region, unchanged from the prior neck CT and potentially reflecting a necrotic retropharyngeal lymph node. Unchanged 0.8 cm right parotid nodule. Upper chest: Severe emphysema. Review of the MIP images confirms the above findings CTA HEAD FINDINGS Anterior circulation: The internal carotid arteries are patent from skull base to carotid termini with minimal nonstenotic plaque. ACAs and MCAs are patent without evidence of a proximal branch occlusion or significant proximal stenosis. The right A1 segment is mildly hypoplastic. No aneurysm is identified. Posterior circulation: The intracranial vertebral arteries are  patent to the basilar with mild irregularity but no flow limiting stenosis. 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 There is a small right posterior communicating artery. Both PCAs are patent without evidence of a significant proximal stenosis. No aneurysm is identified. Venous sinuses: Patent. Anatomic variants: None of significance. Review of the MIP images confirms the above findings IMPRESSION: 1. No large vessel occlusion or significant proximal intracranial stenosis. 2. 65% proximal right ICA stenosis. 3. Moderate right vertebral artery origin stenosis. 4. Bilateral lower neck lymphadenopathy, mildly progressed from the 09/03/2020 neck CT. 5. Aortic Atherosclerosis (ICD10-I70.0) and Emphysema (ICD10-J43.9). Electronically Signed   By: Logan Bores M.D.   On: 10/10/2020 19:54   DG Abd 1 View  Result Date: 10/16/2020 CLINICAL DATA:  Vomiting, orogastric tube placement EXAM: ABDOMEN - 1 VIEW COMPARISON:  Portable exam 0826 hours compared to 10/11/2020 FINDINGS: Slight gaseous distention of stomach. Tip of orogastric tube projects over stomach though the proximal side-port projects over distal esophagus; recommend advancing tube 8 cm. Nonobstructive bowel gas pattern. Improved aeration at LEFT lung base since prior study. Bones demineralized with LEFT hip prosthesis noted. IMPRESSION: Recommend advancing nasogastric to 8 cm. Improved aeration LEFT lower lobe. Electronically Signed   By: Lavonia Dana M.D.   On: 10/16/2020 08:36   CT HEAD WO CONTRAST  Result Date: 10/15/2020 CLINICAL DATA:  70 year old male with neurologic deficit. EXAM: CT HEAD WITHOUT CONTRAST TECHNIQUE: Contiguous axial images were obtained from the base of the skull through the vertex without intravenous contrast. COMPARISON:  Head CT dated 09/13/2020. FINDINGS: Brain: There is mild age-related atrophy and moderate chronic microvascular ischemic changes. There is no acute intracranial  hemorrhage. No mass effect midline shift. No extra-axial fluid collection. Vascular: No hyperdense vessel or unexpected calcification. Skull: There is fracture of the outer table of the left frontal sinus similar to the CT of 10/10/2020. No new fracture. Sinuses/Orbits: Mild mucoperiosteal thickening of paranasal sinuses. No air-fluid level. The mastoid air cells are clear. Other: Partially visualized endotracheal and enteric tubes. Left forehead hematoma. IMPRESSION: 1. No acute intracranial pathology. 2. Age-related atrophy and chronic microvascular ischemic changes. 3. Fracture of the outer table of the left frontal sinus with overlying forehead hematoma similar to the CT of 10/10/2020. Electronically Signed   By: Anner Crete M.D.   On: 10/15/2020 22:45   CT Head Wo Contrast  Result Date: 10/10/2020 CLINICAL DATA:  Fall on bathroom, laceration along the head EXAM: CT HEAD WITHOUT CONTRAST TECHNIQUE: Contiguous axial images were obtained from the base of the skull through the vertex without intravenous contrast. COMPARISON:  09/07/2019 FINDINGS: Brain: The brainstem, cerebellum, cerebral peduncles, thalami, basal ganglia, basilar cisterns, and ventricular system appear within normal limits. Periventricular white matter and corona radiata hypodensities favor  chronic ischemic microvascular white matter disease. No intracranial hemorrhage, mass lesion, or acute CVA. Vascular: There is atherosclerotic calcification of the cavernous carotid arteries bilaterally. Skull: Left frontal fracture along the anterior margin of the frontal sinus underlying the scalp hematoma as shown on image 59 series 4. Sinuses/Orbits: There is opacification of several left-sided mastoid air cells. Although there is a fracture of the anterior margin of the left frontal sinus, no fluid is identified in the sinus at this time. Left periorbital hematoma without observed intraorbital abnormality. Other: Left forehead scalp hematoma  overlying the frontal fracture. IMPRESSION: 1. Fracture of the anterior wall of the left frontal sinus, with overlying scalp hematoma. 2. No intracranial hemorrhage or specific acute intracranial findings. Periventricular white matter and corona radiata hypodensities favor chronic ischemic microvascular white matter disease. 3. Opacification of several left-sided mastoid air cells. Electronically Signed   By: Van Clines M.D.   On: 10/10/2020 17:06   CT Angio Neck W and/or Wo Contrast  Result Date: 10/10/2020 CLINICAL DATA:  Fall. History of extensive stage small cell lung cancer. EXAM: CT ANGIOGRAPHY HEAD AND NECK TECHNIQUE: Multidetector CT imaging of the head and neck was performed using the standard protocol during bolus administration of intravenous contrast. Multiplanar CT image reconstructions and MIPs were obtained to evaluate the vascular anatomy. Carotid stenosis measurements (when applicable) are obtained utilizing NASCET criteria, using the distal internal carotid diameter as the denominator. CONTRAST:  73m OMNIPAQUE IOHEXOL 350 MG/ML SOLN COMPARISON:  Head, cervical spine, and maxillofacial CTs earlier today. Neck CT 09/03/2020. No prior angiographic imaging. FINDINGS: CTA NECK FINDINGS Aortic arch: Standard 3 vessel aortic arch with mild atherosclerotic plaque. No significant arch vessel origin stenosis. Right carotid system: Patent with soft and calcified plaque in the carotid bulb. 65% stenosis of the ICA at the distal aspect of the bulb. Left carotid system: Patent with predominantly calcified plaque in the carotid bulb. No evidence of significant stenosis or dissection. Vertebral arteries: Patent without evidence of dissection or a significant stenosis on the left. Calcified plaque at the right vertebral artery origin results in moderate stenosis. Skeleton: Mild compression fractures in the upper thoracic spine, also present on a chest CT last month. Severe facet arthrosis on the left  at C2-3 and on the right at C3-4 with trace anterolisthesis at the latter. Fracture involving the anterior wall of the left frontal sinus as described on earlier maxillofacial CT. Other neck: 1.3 cm short axis superficial lymph node along the right lower neck and 1.2 cm short axis left level V lymph node as noted on today's earlier cervical spine CT, both larger than on the 09/03/2020 neck CT. 1.9 x 1.3 cm low-density focus in the left nasopharyngeal region, unchanged from the prior neck CT and potentially reflecting a necrotic retropharyngeal lymph node. Unchanged 0.8 cm right parotid nodule. Upper chest: Severe emphysema. Review of the MIP images confirms the above findings CTA HEAD FINDINGS Anterior circulation: The internal carotid arteries are patent from skull base to carotid termini with minimal nonstenotic plaque. ACAs and MCAs are patent without evidence of a proximal branch occlusion or significant proximal stenosis. The right A1 segment is mildly hypoplastic. No aneurysm is identified. Posterior circulation: The intracranial vertebral arteries are patent to the basilar with mild irregularity but no flow limiting stenosis. 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 There is a small right posterior communicating artery. Both PCAs are patent without evidence of a significant proximal stenosis. No aneurysm is identified.  Venous sinuses: Patent. Anatomic variants: None of significance. Review of the MIP images confirms the above findings IMPRESSION: 1. No large vessel occlusion or significant proximal intracranial stenosis. 2. 65% proximal right ICA stenosis. 3. Moderate right vertebral artery origin stenosis. 4. Bilateral lower neck lymphadenopathy, mildly progressed from the 09/03/2020 neck CT. 5. Aortic Atherosclerosis (ICD10-I70.0) and Emphysema (ICD10-J43.9). Electronically Signed   By: Logan Bores M.D.   On: 10/10/2020 19:54   CT Chest W Contrast  Result  Date: 10/10/2020 CLINICAL DATA:  Fall in the bathroom. Left hip pain. Stage IV small cell lung cancer. EXAM: CT CHEST, ABDOMEN, AND PELVIS WITH CONTRAST TECHNIQUE: Multidetector CT imaging of the chest, abdomen and pelvis was performed following the standard protocol during bolus administration of intravenous contrast. CONTRAST:  80 cc Omnipaque 300 IV COMPARISON:  CT chest abdomen pelvis 09/03/2020. FINDINGS: CT CHEST FINDINGS Cardiovascular: No evidence of aortic or acute vascular injury. Moderate aortic atherosclerosis. Right chest port with tip in the lower SVC. Normal heart size with coronary artery calcifications. No pericardial fluid. Mediastinum/Nodes: No mediastinal hemorrhage or hematoma. No enlarged mediastinal or hilar lymph nodes. No pneumomediastinum. Decompressed esophagus. No thyroid nodule. Lungs/Pleura: No pneumothorax or pulmonary contusion. Moderately advanced emphysema. New from prior exam is frothy debris layering in the trachea and tracking into the left greater than right mainstem bronchi. Dependent opacity at the left lung base favored to be atelectasis. Multiple pulmonary nodules are again seen. Accurate measurement of these nodules is limited on the current exam due to motion. Dominant nodules are in the right lower lobe on series 5, image 117 and 123, and in the left lower lobe image 119. These are grossly stable, but again not well measured. No pleural fluid. Musculoskeletal: Breathing motion limits detailed rib assessment. No displaced rib fracture. No evidence of acute fracture of the sternum, included clavicles, or shoulder girdles. No fracture of the thoracic spine. No evidence of focal bone lesion. No confluent chest wall abnormality. CT ABDOMEN PELVIS FINDINGS Hepatobiliary: No hepatic injury or perihepatic hematoma. Tiny hypodense lesion within segment 4 of the liver is too small to characterize but unchanged. Unremarkable gallbladder. Pancreas: No evidence of injury. No ductal  dilatation or inflammation. Spleen: No splenic injury or perisplenic hematoma. Splenule anteriorly. Adrenals/Urinary Tract: No adrenal nodule. No adrenal hemorrhage. No evidence of renal injury. No hydronephrosis. There are bilateral renal cysts as well as low-density lesions too small to accurately characterize, grossly stable from CT last month. Punctate calcification in the anterior dome of the bladder is unchanged. No bladder wall thickening or injury Stomach/Bowel: No evidence of bowel injury or acute abnormality. Lack of enteric contrast as well as patient motion limits detailed assessment. Decompressed stomach. No bowel inflammation. There is no free air. Normal appendix. Sigmoid diverticulosis without diverticulitis. Vascular/Lymphatic: No vascular injury. No acute vascular findings. Moderate aortic atherosclerosis without acute aortic abnormality. IVC is intact. No retroperitoneal fluid. No abdominopelvic adenopathy. Reproductive: Prostate is unremarkable. Other: No ascites or free fluid. No free air. Confluent body wall contusion. Musculoskeletal: Displaced, angulated mildly comminuted left femoral neck fracture. No obvious underlying bone lesion on CT. The remainder of the bony pelvis is intact. Nondisplaced left L3 transverse process fracture. IMPRESSION: 1. Displaced, angulated, mildly comminuted left femoral neck fracture. 2. Nondisplaced left L3 transverse process fracture. 3. No additional acute traumatic injury to the chest, abdomen, or pelvis. 4. New frothy debris layering in the trachea and tracking into the left greater than right mainstem bronchi, retained secretions or aspiration. 5. Multiple  pulmonary nodules are grossly stable from CT last month. Please note the detailed nodule evaluation is limited on the current exam due to motion artifact. 6. Chronic findings of colonic diverticulosis without diverticulitis. Moderate emphysema. Aortic Atherosclerosis (ICD10-I70.0) and Emphysema  (ICD10-J43.9). Electronically Signed   By: Keith Rake M.D.   On: 10/10/2020 17:14   CT ANGIO CHEST PE W OR WO CONTRAST  Result Date: 10/15/2020 CLINICAL DATA:  70 year old male with shortness of breath, respiratory distress. COPD. Lung cancer. EXAM: CT ANGIOGRAPHY CHEST WITH CONTRAST TECHNIQUE: Multidetector CT imaging of the chest was performed using the standard protocol during bolus administration of intravenous contrast. Multiplanar CT image reconstructions and MIPs were obtained to evaluate the vascular anatomy. CONTRAST:  79m OMNIPAQUE IOHEXOL 350 MG/ML SOLN COMPARISON:  Portable chest 0220 hours today and earlier. CT chest 10/10/2020. FINDINGS: Cardiovascular: Excellent contrast bolus timing in the pulmonary arterial tree. No focal filling defect identified in the pulmonary arteries to suggest acute pulmonary embolism. Stable right chest Port-A-Cath. Negative thoracic aorta aside from atherosclerosis. No cardiomegaly or pericardial effusion. Calcified coronary artery atherosclerosis. Mediastinum/Nodes: Enteric tube courses through the esophagus and terminates in the proximal gastric body. Mediastinum is negative for lymphadenopathy. Small lymph nodes appear stable. Lungs/Pleura: Intubated. Endotracheal tube tip in good position between the clavicles and carina. Diffuse centrilobular emphysema. Opacified bronchus intermedius and scattered right middle and lower lobe airways, new since last month. But there is only minimal right lower lobe nodular peribronchial opacity at this time. On the left the mainstem bronchus remains patent, but both the lingula and lower lobe airways are opacified and there is confluent new left lower lobe consolidation with both hypo and hyperenhancing involved lung parenchyma. Mild lingula nodular peribronchial opacity.  No pleural effusion. Upper Abdomen: Negative visible liver, spleen, pancreas, and adrenal glands. Visible kidneys are stable, benign-appearing left upper  pole renal cyst. Negative visible bowel. Musculoskeletal: Stable T4 mild superior endplate compression. No acute or suspicious osseous lesion in the chest. Review of the MIP images confirms the above findings. IMPRESSION: 1. No evidence of acute pulmonary embolus. 2. Abundant retained secretions in the bilateral lower lobe and middle lobe airways, favor Aspiration. Associated confluent left lower lobe Pneumonia. Mild peribronchial infection suspected in the lingula and right lower lobe. Underlying Emphysema (ICD10-J43.9). No pleural effusion. 3. Satisfactory ET tube and enteric tube. 4. Calcified coronary artery and Aortic Atherosclerosis (ICD10-I70.0). Electronically Signed   By: HGenevie AnnM.D.   On: 10/15/2020 04:22   CT Cervical Spine Wo Contrast  Result Date: 10/10/2020 CLINICAL DATA:  Fall, head injury. EXAM: CT CERVICAL SPINE WITHOUT CONTRAST TECHNIQUE: Multidetector CT imaging of the cervical spine was performed without intravenous contrast. Multiplanar CT image reconstructions were also generated. COMPARISON:  CT neck dated 09/03/2020 FINDINGS: Alignment: No vertebral subluxation is observed. Skull base and vertebrae: No cervical spine fracture or acute bony findings. Soft tissues and spinal canal: Atherosclerotic calcification of the carotid arteries and branch vessels. Superficial lymph node along the right lower neck measures 1.3 cm in short axis on image 69 of series 5, previously 1.0 cm. Hypodensity along the left posterior nasopharyngeal margin measuring 2.1 by 1.8 cm, similar to the prior exam. Left level V lymph node measures 1.2 cm in short axis on image 64 series 5, previously 0.9 cm. Disc levels: Mild left foraminal stenosis at C2-3, and right foraminal stenosis at C3-4 due to facet spurring. Upper chest: Severe emphysema in the lung apices. Biapical pleuroparenchymal scarring. Mucus in the trachea. Other: Right central  line partially observed. IMPRESSION: 1. No acute cervical spine findings. 2.  Enlargement of 2 lymph nodes in the lower neck compared to the 09/03/2020 CT of the neck. This may be a manifestation of the patient's known malignancy. 3. Foraminal narrowing at C2-3 and C3-4 due to spurring. 4. Severe emphysema. 5. Carotid atherosclerosis. 6. Stable hypodensity along the left posterior nasopharynx, potentially a mucous retention cyst or necrotic lymph node. Electronically Signed   By: Van Clines M.D.   On: 10/10/2020 17:13   MR BRAIN W WO CONTRAST  Result Date: 10/27/2020 CLINICAL DATA:  70 year old male with history of metastatic small cell carcinoma. Encephalopathy after fall last month. Rhabdomyolysis. Unexplained neurologic deficits, dizziness, double vision. Restaging. EXAM: MRI HEAD WITHOUT AND WITH CONTRAST TECHNIQUE: Multiplanar, multiecho pulse sequences of the brain and surrounding structures were obtained without and with intravenous contrast. CONTRAST:  6.45m GADAVIST GADOBUTROL 1 MMOL/ML IV SOLN COMPARISON:  Head CT without contrast 10/15/2020 and earlier. Brain MRI 09/07/2019. CTA head and neck 10/10/2020, and earlier. FINDINGS: Brain: Ventriculomegaly, new since last year with dilated temporal horns. Up to mild transependymal edema. No restricted diffusion or evidence of acute infarction. But there is abnormal diffusion associated with multiple enhancing brain masses. The largest is a lobulated and heterogeneously enhancing 39 mm mass in the central lower cerebellum (series 11, image 12). There are 2 additional nearby cerebellar metastases, 23 mm in the left superior and up to 24 mm in the right superior cerebellum (same image). Subsequent severe cerebellar edema. Partially effaced 4th ventricle and basilar cisterns. Partially effaced cerebral aqueduct. No tonsillar herniation. Additional round 13 mm right caudate metastasis on series 10, image 27. mild edema at that site. Each of the lesions demonstrates mild hemosiderin. No supratentorial midline shift. No dural  thickening. Superimposed widespread chronic cerebral white matter T2 and FLAIR hyperintensity. Superimposed scattered chronic microhemorrhages elsewhere in the brain, likely small vessel related and not significantly changed since last year allowing for SWI technique today. No extra-axial collection or acute intracranial hemorrhage. Pituitary within normal limits. Vascular: Major intracranial vascular flow voids are stable since last year. Skull and upper cervical spine: Negative visible upper cervical spine, spinal cord. Visualized bone marrow signal is within normal limits. Sinuses/Orbits: Postoperative changes to both globes. Orbits appear stable and negative. Normal suprasellar cistern. Normal cavernous sinus. Paranasal Visualized paranasal sinuses and mastoids are stable and well aerated. Other: Chronic left mastoid effusion has mildly increased. Rim enhancing multi cystic appearing mass at the left fossa of Rosenmuller is new since 2018, and significantly increased since last year now measuring up to the 3 cm (series 11, image 19). This does not seem to directly correspond to what were thought to be small benign midline nasopharyngeal cysts in 2018. And this has progressed from a neck CT 09/03/2020 Other visible upper neck soft tissues appear negative. IMPRESSION: 1. 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 2. Subsequent severe cerebellar edema with partially effaced 4th ventricle and cerebral aqueduct, and associated hydrocephalus with mild transependymal edema. Recommend Neurosurgery consultation. 3. Four brain metastases overall, including a 13 mm right caudate lesion. 4. Suspected cystic metastasis or ex-nodal disease which is enlarging at the left nasopharynx, fossa of Rosenmuller since February. In August last year this lesion was suspected to be cystic/necrotic lymphadenopathy by Neck CT. Study discussed by telephone with Dr. CDia Crawfordon  10/27/2020 at 11:35 . Electronically Signed   By: HGenevie AnnM.D.   On:  10/27/2020 11:41   CT ABDOMEN PELVIS W CONTRAST  Result Date: 10/10/2020 CLINICAL DATA:  Fall in the bathroom. Left hip pain. Stage IV small cell lung cancer. EXAM: CT CHEST, ABDOMEN, AND PELVIS WITH CONTRAST TECHNIQUE: Multidetector CT imaging of the chest, abdomen and pelvis was performed following the standard protocol during bolus administration of intravenous contrast. CONTRAST:  80 cc Omnipaque 300 IV COMPARISON:  CT chest abdomen pelvis 09/03/2020. FINDINGS: CT CHEST FINDINGS Cardiovascular: No evidence of aortic or acute vascular injury. Moderate aortic atherosclerosis. Right chest port with tip in the lower SVC. Normal heart size with coronary artery calcifications. No pericardial fluid. Mediastinum/Nodes: No mediastinal hemorrhage or hematoma. No enlarged mediastinal or hilar lymph nodes. No pneumomediastinum. Decompressed esophagus. No thyroid nodule. Lungs/Pleura: No pneumothorax or pulmonary contusion. Moderately advanced emphysema. New from prior exam is frothy debris layering in the trachea and tracking into the left greater than right mainstem bronchi. Dependent opacity at the left lung base favored to be atelectasis. Multiple pulmonary nodules are again seen. Accurate measurement of these nodules is limited on the current exam due to motion. Dominant nodules are in the right lower lobe on series 5, image 117 and 123, and in the left lower lobe image 119. These are grossly stable, but again not well measured. No pleural fluid. Musculoskeletal: Breathing motion limits detailed rib assessment. No displaced rib fracture. No evidence of acute fracture of the sternum, included clavicles, or shoulder girdles. No fracture of the thoracic spine. No evidence of focal bone lesion. No confluent chest wall abnormality. CT ABDOMEN PELVIS FINDINGS Hepatobiliary: No hepatic injury or perihepatic hematoma. Tiny hypodense lesion within segment 4  of the liver is too small to characterize but unchanged. Unremarkable gallbladder. Pancreas: No evidence of injury. No ductal dilatation or inflammation. Spleen: No splenic injury or perisplenic hematoma. Splenule anteriorly. Adrenals/Urinary Tract: No adrenal nodule. No adrenal hemorrhage. No evidence of renal injury. No hydronephrosis. There are bilateral renal cysts as well as low-density lesions too small to accurately characterize, grossly stable from CT last month. Punctate calcification in the anterior dome of the bladder is unchanged. No bladder wall thickening or injury Stomach/Bowel: No evidence of bowel injury or acute abnormality. Lack of enteric contrast as well as patient motion limits detailed assessment. Decompressed stomach. No bowel inflammation. There is no free air. Normal appendix. Sigmoid diverticulosis without diverticulitis. Vascular/Lymphatic: No vascular injury. No acute vascular findings. Moderate aortic atherosclerosis without acute aortic abnormality. IVC is intact. No retroperitoneal fluid. No abdominopelvic adenopathy. Reproductive: Prostate is unremarkable. Other: No ascites or free fluid. No free air. Confluent body wall contusion. Musculoskeletal: Displaced, angulated mildly comminuted left femoral neck fracture. No obvious underlying bone lesion on CT. The remainder of the bony pelvis is intact. Nondisplaced left L3 transverse process fracture. IMPRESSION: 1. Displaced, angulated, mildly comminuted left femoral neck fracture. 2. Nondisplaced left L3 transverse process fracture. 3. No additional acute traumatic injury to the chest, abdomen, or pelvis. 4. New frothy debris layering in the trachea and tracking into the left greater than right mainstem bronchi, retained secretions or aspiration. 5. Multiple pulmonary nodules are grossly stable from CT last month. Please note the detailed nodule evaluation is limited on the current exam due to motion artifact. 6. Chronic findings of  colonic diverticulosis without diverticulitis. Moderate emphysema. Aortic Atherosclerosis (ICD10-I70.0) and Emphysema (ICD10-J43.9). Electronically Signed   By: Keith Rake M.D.   On: 10/10/2020 17:14   DG Pelvis Portable  Result Date: 10/12/2020 CLINICAL DATA:  Hip replacement EXAM: PORTABLE  PELVIS 1-2 VIEWS COMPARISON:  10/12/2020, 10/11/2020 FINDINGS: Interval left hip arthroplasty with intact hardware and normal alignment. Gas in the soft tissues consistent with recent surgery. IMPRESSION: Status post left hip arthroplasty with expected postsurgical change Electronically Signed   By: Donavan Foil M.D.   On: 10/12/2020 20:57   DG CHEST PORT 1 VIEW  Result Date: 10/25/2020 CLINICAL DATA:  Short of breath EXAM: PORTABLE CHEST 1 VIEW COMPARISON:  10/23/2020 FINDINGS: COPD with hyperexpansion and scarring in the bases. Mild left lower lobe airspace disease slightly improved. No effusion or edema. Port-A-Cath tip in the lower SVC. Feeding tube enters the stomach with the tip not visualized. IMPRESSION: COPD.  Improvement in left lower lobe atelectasis/infiltrate. Electronically Signed   By: Franchot Gallo M.D.   On: 10/25/2020 17:27   DG CHEST PORT 1 VIEW  Result Date: 10/23/2020 CLINICAL DATA:  Cough. EXAM: PORTABLE CHEST 1 VIEW COMPARISON:  10/16/2020 FINDINGS: Stable normal sized heart. Airspace opacity is again demonstrated in the left lower lobe with mild improvement. The remainder of the lungs are clear and hyperexpanded with mild chronic prominence of the interstitial markings. Right jugular porta catheter tip at the superior cavoatrial junction. Feeding tube extending into the stomach. Unremarkable bones. IMPRESSION: 1. Mildly improved left lower lobe pneumonia. 2. Mild changes of COPD. Electronically Signed   By: Claudie Revering M.D.   On: 10/23/2020 08:37   DG CHEST PORT 1 VIEW  Result Date: 10/16/2020 CLINICAL DATA:  Hypoxia EXAM: PORTABLE CHEST 1 VIEW COMPARISON:  October 15, 2020 chest  radiograph and chest CT FINDINGS: Endotracheal tube tip is 4.4 cm above the carina. Nasogastric tube tip and side port in stomach. Port-A-Cath tip in superior vena cava. No pneumothorax. There is underlying emphysematous change. There is airspace consolidation left lower lobe. The heart size is normal. Diminished vascularity in the upper lobes is felt to be due to underlying emphysema. There is aortic atherosclerosis. No bone lesions. No adenopathy. IMPRESSION: Tube and catheter positions as described without pneumothorax. Persistent consolidation consistent with pneumonia left lower lobe. No new opacity. Underlying emphysematous change. There is aortic atherosclerosis. Aortic Atherosclerosis (ICD10-I70.0) and Emphysema (ICD10-J43.9). Electronically Signed   By: Lowella Grip III M.D.   On: 10/16/2020 14:03   DG CHEST PORT 1 VIEW  Result Date: 10/15/2020 CLINICAL DATA:  Dyspnea EXAM: PORTABLE CHEST 1 VIEW COMPARISON:  10/15/2020 FINDINGS: The endotracheal tube terminates above the carina. The right-sided Port-A-Cath is stable in positioning. There is a persistent dense retrocardiac opacity. The lungs appear hyperexpanded. There is no pneumothorax. The enteric tube tip is not visualized on this study. The side hole projects over the GE junction. IMPRESSION: 1. Lines and tubes as above. 2. Otherwise, no significant interval change. Electronically Signed   By: Constance Holster M.D.   On: 10/15/2020 02:37   DG Chest Port 1 View  Result Date: 10/15/2020 CLINICAL DATA:  Acute respiratory distress EXAM: PORTABLE CHEST 1 VIEW COMPARISON:  10/13/2020 FINDINGS: Right Port-A-Cath remains in place, unchanged. There is hyperinflation of the lungs compatible with COPD. Heart is normal size. Left retrocardiac airspace opacity again noted. Increasing right basilar atelectasis or infiltrate. No effusions or acute bony abnormality. IMPRESSION: Bibasilar atelectasis or infiltrates. Electronically Signed   By: Rolm Baptise  M.D.   On: 10/15/2020 01:53   DG Chest Port 1 View  Result Date: 10/13/2020 CLINICAL DATA:  Acute respiratory failure, COPD EXAM: PORTABLE CHEST 1 VIEW COMPARISON:  10/11/2020 chest radiograph. FINDINGS: Right internal jugular Port-A-Cath terminates at  the cavoatrial junction. Stable cardiomediastinal silhouette with top-normal heart size. No pneumothorax. Small left pleural effusion is stable. No right pleural effusion. Cephalization of the pulmonary vasculature without overt pulmonary edema. Patchy left retrocardiac opacity is similar. IMPRESSION: 1. Stable small left pleural effusion. 2. Stable patchy left retrocardiac opacity, either atelectasis or pneumonia. Electronically Signed   By: Ilona Sorrel M.D.   On: 10/13/2020 08:07   DG CHEST PORT 1 VIEW  Result Date: 10/11/2020 CLINICAL DATA:  History distress Evaluate for pneumothorax or pulmonary contusion Advanced emphysema Atelectasis EXAM: PORTABLE CHEST 1 VIEW COMPARISON:  10/18/2013 FINDINGS: Dense opacity noted in the left medial lung base. Right lung is hyperexpanded but otherwise clear. Right chest port is seen. IMPRESSION: 1. No pneumothorax. 2. Opacity at the right medial lung base may be due to atelectasis or pneumonia. Electronically Signed   By: Miachel Roux M.D.   On: 10/11/2020 13:39   DG CHEST PORT 1 VIEW  Result Date: 10/11/2020 CLINICAL DATA:  Evaluate ET tube placement EXAM: PORTABLE CHEST 1 VIEW COMPARISON:  Earlier today. FINDINGS: The ET tube tip is in satisfactory position above the carina. There is an NG tube in place with tip and side port well below the GE junction. The right chest wall port a catheter tip is at the cavoatrial junction. Stable cardiomediastinal contours. Interval complete atelectasis of the left lower lobe with asymmetric left lung volume loss. IMPRESSION: 1. ET tube tip in satisfactory position above the carina. 2. Interval complete atelectasis of the left lower lobe. Electronically Signed   By: Kerby Moors  M.D.   On: 10/11/2020 13:26   DG Abd Portable 1V  Result Date: 10/11/2020 CLINICAL DATA:  ET/OG tube placement EXAM: PORTABLE ABDOMEN - 1 VIEW COMPARISON:  CT chest, abdomen, and pelvis 09/03/2020 FINDINGS: Left basilar opacity is noted. The OG tube terminates in the left upper quadrant at the expected location of the stomach. No dilated loops of bowel to indicate ileus or obstruction. IMPRESSION: OG tube terminates in the left upper quadrant in the expected site of the stomach. Electronically Signed   By: Miachel Roux M.D.   On: 10/11/2020 13:36   DG Swallowing Func-Speech Pathology  Result Date: 11/05/2020 Objective Swallowing Evaluation: Type of Study: MBS-Modified Barium Swallow Study  Patient Details Name: [name] MRN: [id number] Date of Birth: 1950-10-08 Today's Date: 11/05/2020 Time: SLP Start Time (ACUTE ONLY): 6384 -SLP Stop Time (ACUTE ONLY): 1505 SLP Time Calculation (min) (ACUTE ONLY): 20 min Past Medical History: Past Medical History: Diagnosis Date . Anxiety  . CAD (coronary artery disease)  . Cancer ([place])   stage 4 small cell lung cancer  . COPD (chronic obstructive pulmonary disease) ([place])  . Depression  . Dyspnea   increased exertion . Feeling of chest tightness  . Heart palpitations  . History of chemotherapy  . Myocardial infarction ([place])  . Osteopenia  . Panic attacks  . Smoker  Past Surgical History: Past Surgical History: Procedure Laterality Date . BACK SURGERY  12/24/2000  L5,S1 . CORONARY STENT PLACEMENT  2005  RCA & CX . HERNIA REPAIR Right 1980's . INGUINAL HERNIA REPAIR  12/1978  right side . IR FLUORO GUIDE PORT INSERTION RIGHT  04/02/2017 . IR US GUIDE BX ASP/DRAIN  02/03/2017 . IR US GUIDE VASC ACCESS RIGHT  04/02/2017 . NM MYOCAR PERF WALL MOTION  09/07/2009  No ischemia; EF 51% . SHOULDER SURGERY Left 08/2010 . SPINE SURGERY  2002  L5-S1 . TOTAL HIP ARTHROPLASTY Left 10/12/2020  Procedure: TOTALhemi  HIP ARTHROPLASTY ANTERIOR APPROACH;  Surgeon: David Koyanagi, MD;  Location: [facility];  Service: Orthopedics;  Laterality: Left; HPI: Pt is a 70 yo male admitted 3/30 with AMS after fall with unknown downtime (1-3 days suspected). Pt was found to have L hip fx, L L3 transverse process fx, L frontal sinus fx, and AKI with rhabdomyolysis. On 3/31 pt had worsening hypoxia thought to be related to an aspiration event, requiring ETT 3/31-4/2; another suspected aspiration event on requiring re-intubation 4/4-4/6. 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 Pt was reporting intermittent difficulty swallowing solids in 2019 per cancer center notes. Palliative care has been consulted and as of 4/13, family would like full scope of care. MBS 4/11: oropharyngeal dysphagia characterized by reduced bolus cohesion, a pharyngeal delay, reduced lingual retraction, and reduced anterior laryngeal movement. He demonstrated premature spillage to the valleculae with inconsistent spillover to the pyriform sinuses, base of tongue residue, vallecular residue, pyriform sinus residue, penetration (PAS 3, 5) and inconsistent aspiration (PAS 7) of thin and nectar thick iquids. CXR 4/12: Mildly improved left lower lobe pneumonia. Mild changes of COPD.  Subjective: pleasant, "Ill do what you want me to do" Assessment / Plan / Recommendation CHL IP CLINICAL IMPRESSIONS 11/05/2020 Clinical Impression Patient presents with an improved swallow function as compared to previous MBS on 4/11. He continues to exhibit oral phase delays in transit of liquids and solids, leading to premature spillage of puree and regular solids into vallecular sinus. Pharyngeally, he continues to exhibit consistent penetration of thin liquids, however penetrate is trace and does not remain in laryngeal vestibule. No deep penetration or aspiration was observed during this study even when patient instructed to take straw sips and large volume cup sips.  Chin tuck posture with thin liquids was observed during first sip of thin liquids but did not appear to make significant improvements. Patient exhibited minimal vallecular residuals of puree and regular solids post initial swallow, and trace to minimal vallecular, pyriform and lateral channel residuals with thin liquids. Residuals would clear out of pharynx with subsequent dry swallows. No penetration or aspiration observed from pharyngeal residuals. SLP is recommending upgrade of patient's liquids from nectar thick to thin but wait for bedside trials of dysphagia 3 solids before upgrading solid textures. SLP Visit Diagnosis Dysphagia, oropharyngeal phase (R13.12) Attention and concentration deficit following -- Frontal lobe and executive function deficit following -- Impact on safety and function Mild aspiration risk   CHL IP TREATMENT RECOMMENDATION 11/05/2020 Treatment Recommendations Therapy as outlined in treatment plan below   Prognosis 11/05/2020 Prognosis for Safe Diet Advancement Good Barriers to Reach Goals -- Barriers/Prognosis Comment -- CHL IP DIET RECOMMENDATION 11/05/2020 SLP Diet Recommendations Dysphagia 2 (Fine chop) solids;Thin liquid Liquid Administration via Cup;Straw Medication Administration Whole meds with liquid Compensations Slow rate;Small sips/bites;Minimize environmental distractions;Follow solids with liquid Postural Changes Seated upright at 90 degrees   CHL IP OTHER RECOMMENDATIONS 11/05/2020 Recommended Consults -- Oral Care Recommendations Oral care BID Other Recommendations --   [facility]L IP FOLLOW UP RECOMMENDATIONS 11/05/2020 Follow up Recommendations Skilled Nursing facility   [facility] IP FREQUENCY AND DURATION 11/05/2020 Speech Therapy Frequency (ACUTE ONLY) min 2x/week Treatment Duration 2 weeks      CHL IP ORAL PHASE 11/05/2020 Oral Phase Impaired Oral - Pudding Teaspoon -- Oral - Pudding Cup -- Oral - Honey Teaspoon -- Oral - Honey Cup -- Oral - Nectar Teaspoon --  Oral - Nectar Cup Delayed  oral transit Oral - Nectar Straw NT Oral - Thin Teaspoon NT Oral - Thin Cup -- Oral - Thin Straw Delayed oral transit Oral - Puree Delayed oral transit;Premature spillage Oral - Mech Soft NT Oral - Regular Impaired mastication;Delayed oral transit;Premature spillage Oral - Multi-Consistency -- Oral - Pill Decreased bolus cohesion Oral Phase - Comment --  CHL IP PHARYNGEAL PHASE 11/05/2020 Pharyngeal Phase Impaired Pharyngeal- Pudding Teaspoon -- Pharyngeal -- Pharyngeal- Pudding Cup -- Pharyngeal -- Pharyngeal- Honey Teaspoon -- Pharyngeal -- Pharyngeal- Honey Cup -- Pharyngeal -- Pharyngeal- Nectar Teaspoon -- Pharyngeal -- Pharyngeal- Nectar Cup Delayed swallow initiation-vallecula;Reduced airway/laryngeal closure;Penetration/Aspiration during swallow;Pharyngeal residue - valleculae;Pharyngeal residue - pyriform Pharyngeal Material enters airway, remains ABOVE vocal cords then ejected out Pharyngeal- Nectar Straw NT Pharyngeal -- Pharyngeal- Thin Teaspoon -- Pharyngeal -- Pharyngeal- Thin Cup Delayed swallow initiation-vallecula;Pharyngeal residue - valleculae;Pharyngeal residue - pyriform;Penetration/Aspiration during swallow;Reduced airway/laryngeal closure;Lateral channel residue Pharyngeal Material enters airway, remains ABOVE vocal cords then ejected out Pharyngeal- Thin Straw Delayed swallow initiation-vallecula;Penetration/Aspiration during swallow;Reduced airway/laryngeal closure;Pharyngeal residue - valleculae;Pharyngeal residue - pyriform;Lateral channel residue Pharyngeal Material enters airway, remains ABOVE vocal cords then ejected out Pharyngeal- Puree Pharyngeal residue - valleculae;Pharyngeal residue - pyriform Pharyngeal -- Pharyngeal- Mechanical Soft -- Pharyngeal -- Pharyngeal- Regular Pharyngeal residue - valleculae;Pharyngeal residue - pyriform Pharyngeal -- Pharyngeal- Multi-consistency -- Pharyngeal -- Pharyngeal- Pill WFL Pharyngeal -- Pharyngeal Comment --  CHL IP CERVICAL ESOPHAGEAL PHASE  11/05/2020 Cervical Esophageal Phase WFL Pudding Teaspoon -- Pudding Cup -- Honey Teaspoon -- Honey Cup -- Nectar Teaspoon -- Nectar Cup -- Nectar Straw -- Thin Teaspoon -- Thin Cup -- Thin Straw -- Puree -- Mechanical Soft -- Regular -- Multi-consistency -- Pill -- Cervical Esophageal Comment -- Sonia Baller, MA, CCC-SLP Speech Therapy             DG Swallowing Func-Speech Pathology  Result Date: 10/22/2020 Objective Swallowing Evaluation: Type of Study: MBS-Modified Barium Swallow Study  Patient Details Name: ZARIAH JOST MRN: [id number] Date of Birth: May 12, 1951 Today's Date: 10/22/2020 Time: SLP Start Time (ACUTE ONLY): 1440 -SLP Stop Time (ACUTE ONLY): 1455 SLP Time Calculation (min) (ACUTE ONLY): 15 min Past Medical History: Past Medical History: Diagnosis Date . Anxiety  . CAD (coronary artery disease)  . Cancer ([place])   stage 4 small cell lung cancer  . COPD (chronic obstructive pulmonary disease) ([place])  . Depression  . Dyspnea   increased exertion . Feeling of chest tightness  . Heart palpitations  . History of chemotherapy  . Myocardial infarction ([place])  . Osteopenia  . Panic attacks  . Smoker  Past Surgical History: Past Surgical History: Procedure Laterality Date . BACK SURGERY  12/24/2000  L5,S1 . CORONARY STENT PLACEMENT  2005  RCA & CX . HERNIA REPAIR Right 1980's . INGUINAL HERNIA REPAIR  12/1978  right side . IR FLUORO GUIDE PORT INSERTION RIGHT  04/02/2017 . IR US GUIDE BX ASP/DRAIN  02/03/2017 . IR US GUIDE VASC ACCESS RIGHT  04/02/2017 . NM MYOCAR PERF WALL MOTION  09/07/2009  No ischemia; EF 51% . SHOULDER SURGERY Left 08/2010 . SPINE SURGERY  2002  L5-S1 . TOTAL HIP ARTHROPLASTY Left 10/12/2020  Procedure: TOTALhemi  HIP ARTHROPLASTY ANTERIOR APPROACH;  Surgeon: David Koyanagi, MD;  Location: [facility];  Service: Orthopedics;  Laterality: Left; HPI: Pt is a 70 yo male admitted 3/30 with AMS after fall with unknown downtime (1-3 days suspected). Pt was found to have L hip fx, L L3 transverse  process fx,  L frontal sinus fx, and AKI with rhabdomyolysis. On 3/31 pt had worsening hypoxia thought to be related to an aspiration event, requiring ETT 3/31-4/2; another suspected aspiration event on requiring re-intubation 4/4-4/6. 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 Pt was reporting intermittent difficulty swallowing solids in 2019 per cancer center notes.  Subjective: Alert, but pleasantly confused Assessment / Plan / Recommendation CHL IP CLINICAL IMPRESSIONS 10/22/2020 Clinical Impression Pt presents with oropharyngeal dysphagia characterized by reduced bolus cohesion, a pharyngeal delay, reduced lingual retraction, and reduced anterior laryngeal movement. He demonstrated premature spillage to the valleculae with inconsistent spillover to the pyriform sinuses, base of tongue residue, vallecular residue, pyriform sinus residue, penetration (PAS 3, 5) and inconsistent aspiration (PAS 7) of thin and nectar thick iquids. Aspiration consistently triggered a spontaneous cough which was inconsistently effective in expelling the aspirate. Penetration was improved and aspiration eliminated with reduced bolus sizes of nectar thick and thin liquids via cup; however, pt was unable to consistently reduce bolus size and eliminate use of a posterior head tilt despite cues and prompts. Pharyngeal residue was reduced with use of a liquid wash and pt was able to swallow a barium tablet with honey thick liquids via cup. A dysphagia 2 diet with honey thick liquids will be initiated at this time. However, there is potential for advancement of liquids once pt is able to demonstrate use of swallowing precautions. SLP will follow for treatment. SLP Visit Diagnosis Dysphagia, oropharyngeal phase (R13.12) Attention and concentration deficit following -- Frontal lobe and executive function deficit following -- Impact on safety and  function Mild aspiration risk;Moderate aspiration risk   CHL IP TREATMENT RECOMMENDATION 10/22/2020 Treatment Recommendations Therapy as outlined in treatment plan below;F/U MBS in --- days (Comment)   Prognosis 10/22/2020 Prognosis for Safe Diet Advancement Good Barriers to Reach Goals Cognitive deficits Barriers/Prognosis Comment -- CHL IP DIET RECOMMENDATION 10/22/2020 SLP Diet Recommendations Dysphagia 2 (Fine chop) solids;Honey thick liquids Liquid Administration via Cup;Straw Medication Administration Whole meds with puree Compensations Slow rate;Small sips/bites;Minimize environmental distractions;Follow solids with liquid Postural Changes Remain semi-upright after after feeds/meals (Comment);Seated upright at 90 degrees   CHL IP OTHER RECOMMENDATIONS 10/22/2020 Recommended Consults -- Oral Care Recommendations Oral care BID Other Recommendations Order thickener from pharmacy   CHL IP FOLLOW UP RECOMMENDATIONS 10/22/2020 Follow up Recommendations Skilled Nursing facility   [facility] IP FREQUENCY AND DURATION 10/22/2020 Speech Therapy Frequency (ACUTE ONLY) min 2x/week Treatment Duration 2 weeks      CHL IP ORAL PHASE 10/22/2020 Oral Phase Impaired Oral - Pudding Teaspoon -- Oral - Pudding Cup -- Oral - Honey Teaspoon -- Oral - Honey Cup -- Oral - Nectar Teaspoon -- Oral - Nectar Cup Decreased bolus cohesion;Premature spillage Oral - Nectar Straw Decreased bolus cohesion;Premature spillage Oral - Thin Teaspoon -- Oral - Thin Cup Decreased bolus cohesion;Premature spillage Oral - Thin Straw Decreased bolus cohesion;Premature spillage Oral - Puree WFL Oral - Mech Soft -- Oral - Regular Impaired mastication Oral - Multi-Consistency -- Oral - Pill WFL Oral Phase - Comment --  CHL IP PHARYNGEAL PHASE 10/22/2020 Pharyngeal Phase Impaired Pharyngeal- Pudding Teaspoon -- Pharyngeal -- Pharyngeal- Pudding Cup -- Pharyngeal -- Pharyngeal- Honey Teaspoon -- Pharyngeal -- Pharyngeal- Honey Cup -- Pharyngeal -- Pharyngeal- Nectar  Teaspoon -- Pharyngeal -- Pharyngeal- Nectar Cup Trace aspiration;Pharyngeal residue - valleculae;Pharyngeal residue - pyriform;Reduced tongue base retraction;Penetration/Aspiration during swallow;Penetration/Apiration after swallow;Reduced anterior laryngeal mobility;Delayed swallow initiation-vallecula;Delayed  swallow initiation-pyriform sinuses Pharyngeal Material enters airway, remains ABOVE vocal cords and not ejected out;Material enters airway, passes BELOW cords and not ejected out despite cough attempt by patient Pharyngeal- Nectar Straw Trace aspiration;Pharyngeal residue - valleculae;Pharyngeal residue - pyriform;Reduced tongue base retraction;Penetration/Aspiration during swallow;Penetration/Apiration after swallow;Reduced anterior laryngeal mobility;Delayed swallow initiation-vallecula;Delayed swallow initiation-pyriform sinuses Pharyngeal Material enters airway, CONTACTS cords and not ejected out;Material enters airway, passes BELOW cords and not ejected out despite cough attempt by patient Pharyngeal- Thin Teaspoon -- Pharyngeal -- Pharyngeal- Thin Cup Trace aspiration;Pharyngeal residue - valleculae;Pharyngeal residue - pyriform;Reduced tongue base retraction;Penetration/Aspiration during swallow;Penetration/Apiration after swallow;Reduced anterior laryngeal mobility;Delayed swallow initiation-vallecula;Delayed swallow initiation-pyriform sinuses Pharyngeal Material enters airway, passes BELOW cords and not ejected out despite cough attempt by patient;Material enters airway, remains ABOVE vocal cords and not ejected out Pharyngeal- Thin Straw Trace aspiration;Pharyngeal residue - valleculae;Pharyngeal residue - pyriform;Reduced tongue base retraction;Penetration/Aspiration during swallow;Penetration/Apiration after swallow;Reduced anterior laryngeal mobility;Delayed swallow initiation-vallecula;Delayed swallow initiation-pyriform sinuses Pharyngeal Material enters airway, CONTACTS cords and not  ejected out;Material enters airway, passes BELOW cords and not ejected out despite cough attempt by patient Pharyngeal- Puree Pharyngeal residue - valleculae;Pharyngeal residue - pyriform;Reduced tongue base retraction;Reduced anterior laryngeal mobility;Delayed swallow initiation-vallecula;Delayed swallow initiation-pyriform sinuses Pharyngeal -- Pharyngeal- Mechanical Soft -- Pharyngeal -- Pharyngeal- Regular -- Pharyngeal -- Pharyngeal- Multi-consistency -- Pharyngeal -- Pharyngeal- Pill Pharyngeal residue - valleculae;Pharyngeal residue - pyriform;Reduced tongue base retraction;Reduced anterior laryngeal mobility;Delayed swallow initiation-vallecula;Delayed swallow initiation-pyriform sinuses Pharyngeal -- Pharyngeal Comment --  CHL IP CERVICAL ESOPHAGEAL PHASE 10/22/2020 Cervical Esophageal Phase WFL Pudding Teaspoon -- Pudding Cup -- Honey Teaspoon -- Honey Cup -- Nectar Teaspoon -- Nectar Cup -- Nectar Straw -- Thin Teaspoon -- Thin Cup -- Thin Straw -- Puree -- Mechanical Soft -- Regular -- Multi-consistency -- Pill -- Cervical Esophageal Comment -- Shanika I. Hardin Negus, [place], [place] Office number [phone] Pager [place] 10/22/2020, 4:50 PM              DG C-Arm 1-60 Min  Result Date: 10/12/2020 CLINICAL DATA:  Anterior left hip. EXAM: DG C-ARM 1-60 MIN; OPERATIVE LEFT HIP WITH PELVIS FLUOROSCOPY TIME:  Fluoroscopy Time:  6 seconds Number of Acquired Spot Images: 6. COMPARISON:  March 31, 22 FINDINGS: Six C-arm fluoroscopic images were obtained intraoperatively and submitted for post operative interpretation. These images demonstrate surgical changes of left total hip arthroplasty for a left femoral neck fracture. Please see the performing provider's procedural report for further detail. IMPRESSION: Left total hip arthroplasty. Electronically Signed   By: David Sheffield MD   On: 10/12/2020 13:46   ECHOCARDIOGRAM COMPLETE  Result Date: 10/11/2020     ECHOCARDIOGRAM REPORT   Patient Name:   HEBER HOOG Date of Exam: 10/11/2020 Medical Rec #:  [id number]      Height:       69.0 in Accession #:    [id number]     Weight:       147.0 lb Date of Birth:  26-Dec-1950     BSA:          1.813 m Patient Age:    18 years       BP:           85/49 mmHg Patient Gender: M              HR:           88 bpm. Exam Location:  Inpatient Procedure: 2D Echo Indications:    pulmonary embolus  History:  Patient has no prior history of Echocardiogram examinations.                 CAD; COPD and lung cancer.  Sonographer:    Johny Chess Referring Phys: [id number] JIRC XU  Sonographer Comments: Echo performed with patient supine and on artificial respirator. Image acquisition challenging due to respiratory motion. IMPRESSIONS  1. Left ventricular ejection fraction, by estimation, is 50 to 55%. The left ventricle has low normal function. The left ventricle has no regional wall motion abnormalities. Left ventricular diastolic parameters are indeterminate.  2. Right ventricular systolic function is normal. The right ventricular size is normal.  3. The mitral valve is grossly normal. Trivial mitral valve regurgitation.  4. The aortic valve has an indeterminant number of cusps. There is moderate calcification of the aortic valve. There is mild thickening of the aortic valve. Aortic valve regurgitation is trivial. Mild to moderate aortic valve sclerosis/calcification is present, without any evidence of aortic stenosis.  5. The inferior vena cava is dilated in size with >50% respiratory variability, suggesting right atrial pressure of 8 mmHg. Comparison(s): No prior Echocardiogram. Conclusion(s)/Recommendation(s): Otherwise normal echocardiogram, with minor abnormalities described in the report. Frequent PVCs throughout study. FINDINGS  Left Ventricle: Left ventricular ejection fraction, by estimation, is 50 to 55%. The left ventricle has low normal function. The left ventricle has no  regional wall motion abnormalities. The left ventricular internal cavity size was normal in size. There is no left ventricular hypertrophy. Left ventricular diastolic parameters are indeterminate. Right Ventricle: The right ventricular size is normal. No increase in right ventricular wall thickness. Right ventricular systolic function is normal. Left Atrium: Left atrial size was normal in size. Right Atrium: Right atrial size was normal in size. Pericardium: There is no evidence of pericardial effusion. Presence of pericardial fat pad. Mitral Valve: The mitral valve is grossly normal. Trivial mitral valve regurgitation. Tricuspid Valve: The tricuspid valve is normal in structure. Tricuspid valve regurgitation is trivial. Aortic Valve: The aortic valve has an indeterminant number of cusps. There is moderate calcification of the aortic valve. There is mild thickening of the aortic valve. Aortic valve regurgitation is trivial. Mild to moderate aortic valve sclerosis/calcification is present, without any evidence of aortic stenosis. Pulmonic Valve: The pulmonic valve was not well visualized. Pulmonic valve regurgitation is not visualized. Aorta: The aortic root is normal in size and structure and the ascending aorta was not well visualized. Venous: The inferior vena cava is dilated in size with greater than 50% respiratory variability, suggesting right atrial pressure of 8 mmHg. IAS/Shunts: The atrial septum is grossly normal.  LEFT VENTRICLE PLAX 2D LVIDd:         5.00 cm  Diastology LVIDs:         3.90 cm  LV e' medial:  4.46 cm/s LV PW:         1.00 cm  LV e' lateral: 7.94 cm/s LV IVS:        1.00 cm LVOT diam:     2.40 cm LV SV:         61 LV SV Index:   33 LVOT Area:     4.52 cm  RIGHT VENTRICLE             IVC RV S prime:     10.80 cm/s  IVC diam: 2.60 cm TAPSE (M-mode): 1.7 cm LEFT ATRIUM           Index       RIGHT ATRIUM  Index LA diam:      2.90 cm 1.60 cm/m  RA Area:     12.80 cm LA Vol (A4C):  21.9 ml 12.08 ml/m RA Volume:   30.80 ml  16.99 ml/m  AORTIC VALVE LVOT Vmax:   77.70 cm/s LVOT Vmean:  55.800 cm/s LVOT VTI:    0.134 m  AORTA Ao Root diam: 3.20 cm  SHUNTS Systemic VTI:  0.13 m Systemic Diam: 2.40 cm Buford Dresser MD Electronically signed by Buford Dresser MD Signature Date/Time: 10/11/2020/9:14:16 PM    Final    DG HIP PORT UNILAT WITH PELVIS 1V LEFT  Result Date: 10/30/2020 CLINICAL DATA:  Fall.  Left hip pain. EXAM: DG HIP (WITH OR WITHOUT PELVIS) 1V PORT LEFT COMPARISON:  10/12/2020. FINDINGS: Left hip replacement. Hardware intact. Anatomic alignment. Diffuse osteopenia. Degenerative changes lumbar spine and right hip. No acute bony or joint abnormality. Barium noted throughout the colon. Pelvic calcifications consistent with phleboliths. IMPRESSION: 1.  Left hip replacement.  Hardware intact.  Anatomic alignment. 2. Diffuse osteopenia. Degenerative changes lumbar spine and right hip. No acute bony abnormality. Electronically Signed   By: Marcello Moores  Register   On: 10/30/2020 11:19   DG HIP OPERATIVE UNILAT W OR W/O PELVIS LEFT  Result Date: 10/12/2020 CLINICAL DATA:  Anterior left hip. EXAM: DG C-ARM 1-60 MIN; OPERATIVE LEFT HIP WITH PELVIS FLUOROSCOPY TIME:  Fluoroscopy Time:  6 seconds Number of Acquired Spot Images: 6. COMPARISON:  March 31, 22 FINDINGS: Six C-arm fluoroscopic images were obtained intraoperatively and submitted for post operative interpretation. These images demonstrate surgical changes of left total hip arthroplasty for a left femoral neck fracture. Please see the performing provider's procedural report for further detail. IMPRESSION: Left total hip arthroplasty. Electronically Signed   By: David Sheffield MD   On: 10/12/2020 13:46   DG HIP UNILAT WITH PELVIS 2-3 VIEWS LEFT  Result Date: 10/11/2020 CLINICAL DATA:  Left hip fracture. EXAM: DG HIP (WITH OR WITHOUT PELVIS) 2-3V LEFT COMPARISON:  Abdominal CT reformats yesterday. FINDINGS: Displaced  left femoral neck fracture. Fracture is mildly comminuted. There is mild apex lateral angulation and proximal migration of the femoral shaft. Pubic rami are intact. Pubic symphysis and sacroiliac joints are congruent. IMPRESSION: Displaced left femoral neck fracture. Electronically Signed   By: Keith Rake M.D.   On: 10/11/2020 17:56   CT Maxillofacial Wo Contrast  Result Date: 10/10/2020 CLINICAL DATA:  Facial trauma EXAM: CT MAXILLOFACIAL WITHOUT CONTRAST TECHNIQUE: Multidetector CT imaging of the maxillofacial structures was performed. Multiplanar CT image reconstructions were also generated. COMPARISON:  CT head 10/10/2020 FINDINGS: Osseous: Acute fracture of the anterior wall of the left frontal sinus with overlying scalp hematoma as on image 75 series 5. No significant fluid is currently present in the sinus. No other facial fractures are identified. Orbits: Left periorbital hematoma but without intraorbital abnormality observed. Sinuses: As noted above there is a fracture along the anterior wall of the left frontal sinus but without a fluid level in the sinus. Opacification of several mastoid air cells noted compatible with a small left mastoid effusion. Soft tissues: Left forehead scalp hematoma extending into the periorbital region. Limited intracranial: Please see CT head report. IMPRESSION: 1. Acute fracture the anterior wall of the left frontal sinus with overlying scalp hematoma. 2. Small left mastoid effusion. Electronically Signed   By: Van Clines M.D.   On: 10/10/2020 17:15        The results of significant diagnostics from this hospitalization (including imaging, microbiology,  ancillary and laboratory) are listed below for reference.     Microbiology: Recent Results (from the past 240 hour(s))  SARS CORONAVIRUS 2 (TAT 6-24 HRS) Nasopharyngeal Nasopharyngeal Swab     Status: None   Collection Time: 11/08/20  4:26 AM   Specimen: Nasopharyngeal Swab  Result Value Ref  Range Status   SARS Coronavirus 2 NEGATIVE NEGATIVE Final    Comment: (NOTE) SARS-CoV-2 target nucleic acids are NOT DETECTED.  The SARS-CoV-2 RNA is generally detectable in upper and lower respiratory specimens during the acute phase of infection. Negative results do not preclude SARS-CoV-2 infection, do not rule out co-infections with other pathogens, and should not be used as the sole basis for treatment or other patient management decisions. Negative results must be combined with clinical observations, patient history, and epidemiological information. The expected result is Negative.  Fact Sheet for Patients: SugarRoll.be  Fact Sheet for Healthcare Providers: https://www.woods-mathews.com/  This test is not yet approved or cleared by the [place] FDA and  has been authorized for detection and/or diagnosis of SARS-CoV-2 by FDA under an Emergency Use Authorization (EUA). This EUA will remain  in effect (meaning this test can be used) for the duration of the COVID-19 declaration under Se ction 564(b)(1) of the Act, 21 U.S.C. section 360bbb-3(b)(1), unless the authorization is terminated or revoked sooner.  Performed at [facility], [place] [address], [place], [place] [address]      Labs:  CBC: Recent Labs  Lab 11/03/20 [phone] 11/04/20 0304 11/05/20 0439 11/06/20 0454 11/07/20 0418  WBC 7.2 8.2 8.9 8.3 11.2*  NEUTROABS 5.8 7.3 7.8* 7.2 9.7*  HGB 10.0* 10.3* 9.8* 9.4* 9.5*  HCT 31.1* 31.6* 30.1* 29.7* 29.8*  MCV 99.7 98.1 98.0 99.7 99.0  PLT 275 272 225 208 188   BMP &GFR Recent Labs  Lab 11/03/20 0442 11/04/20 0304 11/05/20 0439 11/06/20 0454 11/07/20 0418 11/08/20 0323 11/09/20 0415  NA 136 136 135 136 139  --   --   K 3.4* 4.1 4.3 4.2 4.1  --   --   CL 105 106 104 104 108  --   --   CO2 23 24 25 26 24   --   --   GLUCOSE 139* 129* 134* 125* 145*  --   --   BUN 28* 30* 31* 31* 42*  --   --   CREATININE  0.70 0.70 0.75 0.75 0.81  --   --   CALCIUM 8.7* 8.8* 8.8* 8.5* 8.7*  --   --   MG 2.0 2.1 2.0 2.1 2.0 1.9 2.0  PHOS 2.8 3.0 3.0 3.5 2.7 2.5 3.6   Estimated Creatinine Clearance: 76.6 mL/min (by C-G formula based on SCr of 0.81 mg/dL). Liver & Pancreas: Recent Labs  Lab 11/03/20 0442 11/04/20 0304 11/05/20 0439 11/06/20 0454 11/07/20 0418  AST 12* 12* 14* 12* 18  ALT 20 18 16 17 25   ALKPHOS 73 78 72 61 74  BILITOT <0.1* 0.5 0.5 0.4 0.3  PROT 5.7* 6.1* 5.8* 5.4* 4.9*  ALBUMIN 3.0* 3.3* 3.0* 3.0* 2.7*   No results for input(s): LIPASE, AMYLASE in the last 168 hours. No results for input(s): AMMONIA in the last 168 hours. Diabetic: Recent Labs    11/09/20 0415  HGBA1C 5.1   Recent Labs  Lab 11/08/20 2025 11/08/20 2353 11/09/20 0427 11/09/20 0753 11/09/20 1149  GLUCAP 156* 191* 153* 125* 108*   Cardiac Enzymes: No results for input(s): CKTOTAL, CKMB, CKMBINDEX, TROPONINI in the last 168  hours. No results for input(s): PROBNP in the last 8760 hours. Coagulation Profile: No results for input(s): INR, PROTIME in the last 168 hours. Thyroid Function Tests: No results for input(s): TSH, T4TOTAL, FREET4, T3FREE, THYROIDAB in the last 72 hours. Lipid Profile: No results for input(s): CHOL, HDL, LDLCALC, TRIG, CHOLHDL, LDLDIRECT in the last 72 hours. Anemia Panel: No results for input(s): VITAMINB12, FOLATE, FERRITIN, TIBC, IRON, RETICCTPCT in the last 72 hours. Urine analysis:    Component Value Date/Time   COLORURINE AMBER (A) 10/10/2020 1610   APPEARANCEUR HAZY (A) 10/10/2020 1610   LABSPEC 1.029 10/10/2020 1610   PHURINE 5.0 10/10/2020 1610   GLUCOSEU NEGATIVE 10/10/2020 1610   HGBUR LARGE (A) 10/10/2020 1610   BILIRUBINUR NEGATIVE 10/10/2020 1610   KETONESUR 20 (A) 10/10/2020 1610   PROTEINUR 100 (A) 10/10/2020 1610   NITRITE NEGATIVE 10/10/2020 1610   LEUKOCYTESUR NEGATIVE 10/10/2020 1610   Sepsis Labs: Invalid input(s): PROCALCITONIN, LACTICIDVEN   Time  coordinating discharge: 40 minutes  SIGNED:  Mercy Riding, MD  [facility] 11/09/2020, 12:45 PM  If 7PM-7AM, please contact night-coverage [email or web address]

## 2020-11-09 NOTE — Progress Notes (Signed)
Occupational Therapy Treatment Patient Details Name: JABIR DAHLEM MRN: [id number] DOB: 07/13/1951 Today's Date: 11/09/2020    History of present illness Pt is 70 yo admitted 3/30 after fall and found by brother with unknown down time (1-3 days). Fractured L frontal sinus,L L3 transverse process, displaced angulated mildly comminuted L femoral neck fx. AKI with rhabdo. 3/31 pt with hypoxia requiring intubation, extubated 4/1. Pt s/p left anterior hip hemiarthroplasty 4/1. Re-intubated 4/4 for hypoxemia and transfered to ICU, extubated 10/17/20. Progress limited by orthostasis. 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 Patient reported less dizziness today in sitting and less symptomatic. Patient able to stand at sink approx 2 minutes with elbows braced on counter before needing to sit down to progressing dizziness. Improved ability to take steps and maintain standing. Continue to recommend short term rehab at discharge.   Follow Up Recommendations  SNF    Equipment Recommendations  None recommended by OT    Recommendations for Other Services      Precautions / Restrictions Precautions Precautions: Fall Precaution Comments: constant dizziness, hx of orthostatic hypotension Restrictions Weight Bearing Restrictions: No LLE Weight Bearing: Weight bearing as tolerated Other Position/Activity Restrictions: s/p L hemi arthroplasty, anterior approach, no precautions       Mobility Bed Mobility Overal bed  mobility: Needs Assistance Bed Mobility: Supine to Sit     Supine to sit: Min guard;HOB elevated     General bed mobility comments: Patient used bed rails to transfer himself to side of the bed. Therapist providing min guard for safety. Reports dizziness at edge of bed - but less symptomatic than last treatment.    Transfers Overall transfer level: Needs assistance Equipment used: Rolling walker (2 wheeled) Transfers: Sit to/from Stand Sit to Stand: Min guard;From elevated surface         General transfer comment: Min guard to stand with RW. Reports dizziness with standing but not "limiting." Able to take steps to sink and stand sink approx 2 minutes to allow water to heat up and wash face with elbows propped on counter. Became increasingly dizzy and needed to sit down in recliner.    Balance Overall balance assessment: Needs assistance Sitting-balance support: Feet supported Sitting balance-Leahy Scale: Fair Sitting balance - Comments: less UE support at edge of bed today   Standing balance support: During functional activity;Bilateral upper extremity supported Standing balance-Leahy Scale: Poor Standing balance comment: reliant on walker                           ADL either performed or assessed with clinical judgement   ADL Overall ADL's : Needs assistance/impaired     Grooming: Standing;Min guard Grooming Details (indicate cue type and reason): patient stood at sink approx 2 min with elbows propped on counter to wash face  Vision Patient Visual Report: No change from baseline     Perception     Praxis      Cognition Arousal/Alertness: Awake/alert Behavior During Therapy: WFL for tasks assessed/performed Overall Cognitive Status: Within Functional Limits for tasks assessed                                          Exercises     Shoulder Instructions       General Comments       Pertinent Vitals/ Pain       Pain Assessment: Faces Faces Pain Scale: Hurts a little bit Pain Location: left hip - with mobility. Pain Descriptors / Indicators: Grimacing;Guarding Pain Intervention(s): Monitored during session  Home Living                                          Prior Functioning/Environment              Frequency  Min 2X/week        Progress Toward Goals  OT Goals(current goals can now be found in the care plan section)  Progress towards OT goals: Progressing toward goals  Acute Rehab OT Goals Patient Stated Goal: to go to rehab OT Goal Formulation: With patient Time For Goal Achievement: 11/14/20 Potential to Achieve Goals: Good  Plan Discharge plan remains appropriate    Co-evaluation          OT goals addressed during session:  (functional mobility)      AM-PAC OT "6 Clicks" Daily Activity     Outcome Measure   Help from another person eating meals?: A Little Help from another person taking care of personal grooming?: A Little Help from another person toileting, which includes using toliet, bedpan, or urinal?: A Lot Help from another person bathing (including washing, rinsing, drying)?: A Lot Help from another person to put on and taking off regular upper body clothing?: A Little Help from another person to put on and taking off regular lower body clothing?: Total 6 Click Score: 14    End of Session Equipment Utilized During Treatment: Gait belt;Rolling walker  OT Visit Diagnosis: Unsteadiness on feet (R26.81);Cognitive communication deficit (R41.841);Pain Symptoms and signs involving cognitive functions: Cerebral infarction Pain - Right/Left: Left Pain - part of body: Leg   Activity Tolerance Patient tolerated treatment well   Patient Left in bed;with call bell/phone within reach;with nursing/sitter in room;in chair;with chair alarm set;with family/visitor present   Nurse Communication Mobility status         Time: 1425-1443 OT Time Calculation (min): 18 min  Charges: OT General Charges $OT Visit: 1 Visit OT Treatments $Therapeutic Activity: 8-22 mins  Jonie Burdell, OTR/L [place]  Office [phone] Pager: [place] 11/09/2020, 3:27 PM

## 2020-11-09 NOTE — TOC Progression Note (Signed)
Transition of Care [facility]) - Progression Note    Patient Details  Name: [name] MRN: [id number] Date of Birth: July 17, 1950  [facility] CM/SW [place], [place] Phone Number: 11/09/2020, 3:29 PM  Clinical Narrative:   Damaris Schooner with Ebony Hail twice at [facility] today.  No authorization yet.  I gave her the number for the weekend CSW and asked her to call them when she gets authorization to coordinate transfer. TOC will continue to follow during the course of hospitalization.     Expected Discharge Plan: [place] Barriers to Discharge: Continued Medical Work up (Cortrak and tube feedings in place.)  Expected Discharge Plan and Services Expected Discharge Plan: [place] In-house Referral: Clinical Social Work Discharge Planning Services: CM Consult Post Acute Care Choice: [place] arrangements for the past 2 months: Single Family Home Expected Discharge Date: 11/09/20                                     Social Determinants of Health (SDOH) Interventions    Readmission Risk Interventions No flowsheet data found.

## 2020-11-09 NOTE — Progress Notes (Signed)
Daily Progress Note   Patient Name: [name]       Date: 11/09/2020 DOB: 1950-10-12  Age: 70 y.o. MRN#: [id number] Attending Physician: Mercy Riding, MD Primary Care Physician: [name], MD Admit Date: 10/10/2020  Reason for Consultation/Follow-up: Establishing goals of care  Subjective: No distress, resting in bed.   Length of Stay: 30  Current Medications: Scheduled Meds:  . (feeding supplement) PROSource Plus  30 mL Oral BID BM  . arformoterol  15 mcg Nebulization BID  . aspirin  81 mg Oral Daily  . chlorhexidine  15 mL Mouth Rinse BID  . Chlorhexidine Gluconate Cloth  6 each Topical Daily  . dexamethasone (DECADRON) injection  4 mg Intravenous Q8H  . diphenoxylate-atropine  5 mL Oral Daily  . doxazosin  2 mg Oral Daily  . famotidine  20 mg Oral BID  . feeding supplement  237 mL Oral BID BM  . gabapentin  100 mg Oral Q8H  . heparin injection (subcutaneous)  5,000 Units Subcutaneous Q8H  . insulin aspart  0-9 Units Subcutaneous Q4H  . mouth rinse  15 mL Mouth Rinse q12n4p  . melatonin  5 mg Oral QHS  . metoprolol tartrate  12.5 mg Oral BID  . mirtazapine  30 mg Oral QHS  . nicotine  14 mg Transdermal Daily  . revefenacin  175 mcg Nebulization Daily  . sodium chloride flush  10-40 mL Intracatheter Q12H  . sodium chloride flush  10-40 mL Intracatheter Q12H    Continuous Infusions: . sodium chloride Stopped (10/25/20 0829)  . sodium chloride 50 mL/hr at 11/05/20 2129    PRN Meds: sodium chloride, acetaminophen **OR** acetaminophen, albuterol, ALPRAZolam, hydrALAZINE, ondansetron (ZOFRAN) IV, polyethylene glycol, Resource ThickenUp Clear, sodium chloride flush, sodium chloride flush, sodium chloride flush  Physical Exam         Awake alert Is thin and  has muscle wasting Regular work of breathing S 1 S 2  Abdomen not distended No edema  Vital Signs: BP 120/63 (BP Location: Left Arm)   Pulse 65   Temp 97.9 F (36.6 C) (Oral)   Resp 20   Ht 5\' 9"  (1.753 m)   Wt 62.9 kg   SpO2 99%   BMI 20.48 kg/m  SpO2: SpO2: 99 % O2 Device: O2 Device: Room Air O2  Flow Rate: O2 Flow Rate (L/min): 0 L/min  Intake/output summary:   Intake/Output Summary (Last 24 hours) at 11/09/2020 1042 Last data filed at 11/09/2020 0518 Gross per 24 hour  Intake 844.87 ml  Output 950 ml  Net -105.13 ml   LBM: Last BM Date: 11/08/20 Baseline Weight: Weight: 66.7 kg Most recent weight: Weight: 62.9 kg       Palliative Assessment/Data: PPS: 50%    Flowsheet Rows   Flowsheet Row Most Recent Value  Intake Tab   Referral Department Hospitalist  Unit at Time of Referral Intermediate Care Unit  Date Notified 10/20/20  Palliative Care Type New Palliative care  Reason for referral Clarify Goals of Care  Date of Admission 10/10/20  Date first seen by Palliative Care 10/21/20  # of days Palliative referral response time 1 Day(s)  # of days IP prior to Palliative referral 10  Clinical Assessment   Psychosocial & Spiritual Assessment   Palliative Care Outcomes       Patient Active Problem List   Diagnosis Date Noted  . Primary malignant neoplasm of lung with metastasis to brain ([place]) 10/27/2020  . Essential hypertension 10/27/2020  . Thrombocytopenia ([place]) 10/27/2020  . Tobacco abuse 10/27/2020  . Sacral pressure ulcer 10/25/2020  . Orthostatic hypotension 10/25/2020  . Oropharyngeal dysphagia   . Status post total replacement of left hip   . Non-traumatic rhabdomyolysis   . Fall   . Left-sided weakness   . Acute respiratory failure with hypoxia ([place])   . Protein-calorie malnutrition, severe 10/16/2020  . Closed fracture of left hip ([place])   . Pressure injury of skin 10/11/2020  . Sepsis without septic shock ([place])   . Left displaced femoral  neck fracture ([place])   . Delirium   . Closed fracture of frontal sinus ([place])   . Closed fracture of transverse process of lumbar vertebra ([place])   . Goals of care, counseling/discussion 12/19/2019  . Iron deficiency anemia 09/12/2019  . Small cell lung cancer in adult [facility]) 02/05/2017  . Lymphadenopathy of head and neck 01/22/2017  . Osteopenia 03/30/2015  . Clubbing of fingers 04/14/2014  . Hyperglycemia 10/20/2013  . Smoker   . Closed dislocation of acromioclavicular joint 04/11/2010  . BREAST MASS, LEFT 01/20/2008  . ONYCHOMYCOSIS, TOENAILS 10/20/2007  . MUSCLE CRAMPS 10/20/2007  . Hypercholesterolemia 05/28/2007  . CATARACT NOS 08/28/2006  . DISTURBANCE, VISUAL NOS 08/19/2006  . COPD (chronic obstructive pulmonary disease) with emphysema ([place]) 08/19/2006  . WITHDRAWAL, DRUG 06/24/2006  . ANXIETY 06/24/2006  . DEPRESSION 06/24/2006  . MYOCARDIAL INFARCTION, HX OF 06/24/2006  . Coronary atherosclerosis 06/24/2006  . CARDIAC ARRHYTHMIA 06/24/2006  . CONSTIPATION 06/24/2006  . OSTEOARTHRITIS 06/24/2006  . LOW BACK PAIN 06/24/2006  . 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 He remains confused. Has significant dysphagia and is currently NPO with coretrak in place. Palliative medicine consulted for goals of care for this very ill man with multiple comorbidities who is at high risk of decompensation and dying.  Assessment/Recommendations/Plan - Continue current mode of care Recommend skilled nursing facility with palliative care to follow-up on discharge,  discussed with TRH  MD.   Goals of Care and Additional Recommendations: Limitations on Scope of Treatment: Full Scope Treatment  Code Status: DNR  Prognosis:  guarded.   Discharge Planning: To Be Determined  Care plan was discussed with IDT  Thank you for allowing the Palliative Medicine Team to assist in the care of this patient.   Total time: 15 minutes Greater than 50%  of this time was spent counseling and coordinating care related to the above assessment and plan.   Loistine Chance, MD [place] Team [phone]  Please contact Palliative Medicine Team phone at [phone] for questions and concerns.

## 2020-11-10 DIAGNOSIS — L89159 Pressure ulcer of sacral region, unspecified stage: Secondary | ICD-10-CM | POA: Diagnosis not present

## 2020-11-10 DIAGNOSIS — E785 Hyperlipidemia, unspecified: Secondary | ICD-10-CM | POA: Diagnosis not present

## 2020-11-10 DIAGNOSIS — J432 Centrilobular emphysema: Secondary | ICD-10-CM | POA: Diagnosis not present

## 2020-11-10 DIAGNOSIS — R279 Unspecified lack of coordination: Secondary | ICD-10-CM | POA: Diagnosis not present

## 2020-11-10 DIAGNOSIS — E119 Type 2 diabetes mellitus without complications: Secondary | ICD-10-CM | POA: Diagnosis not present

## 2020-11-10 DIAGNOSIS — M6282 Rhabdomyolysis: Secondary | ICD-10-CM | POA: Diagnosis not present

## 2020-11-10 DIAGNOSIS — R278 Other lack of coordination: Secondary | ICD-10-CM | POA: Diagnosis not present

## 2020-11-10 DIAGNOSIS — C770 Secondary and unspecified malignant neoplasm of lymph nodes of head, face and neck: Secondary | ICD-10-CM | POA: Diagnosis not present

## 2020-11-10 DIAGNOSIS — F419 Anxiety disorder, unspecified: Secondary | ICD-10-CM | POA: Diagnosis not present

## 2020-11-10 DIAGNOSIS — M160 Bilateral primary osteoarthritis of hip: Secondary | ICD-10-CM | POA: Diagnosis not present

## 2020-11-10 DIAGNOSIS — R5381 Other malaise: Secondary | ICD-10-CM | POA: Diagnosis not present

## 2020-11-10 DIAGNOSIS — R55 Syncope and collapse: Secondary | ICD-10-CM | POA: Diagnosis not present

## 2020-11-10 DIAGNOSIS — W19XXXD Unspecified fall, subsequent encounter: Secondary | ICD-10-CM | POA: Diagnosis not present

## 2020-11-10 DIAGNOSIS — S32038D Other fracture of third lumbar vertebra, subsequent encounter for fracture with routine healing: Secondary | ICD-10-CM | POA: Diagnosis not present

## 2020-11-10 DIAGNOSIS — C7931 Secondary malignant neoplasm of brain: Secondary | ICD-10-CM | POA: Diagnosis not present

## 2020-11-10 DIAGNOSIS — M2042 Other hammer toe(s) (acquired), left foot: Secondary | ICD-10-CM | POA: Diagnosis not present

## 2020-11-10 DIAGNOSIS — Z760 Encounter for issue of repeat prescription: Secondary | ICD-10-CM | POA: Diagnosis not present

## 2020-11-10 DIAGNOSIS — G9341 Metabolic encephalopathy: Secondary | ICD-10-CM | POA: Diagnosis not present

## 2020-11-10 DIAGNOSIS — R2689 Other abnormalities of gait and mobility: Secondary | ICD-10-CM | POA: Diagnosis not present

## 2020-11-10 DIAGNOSIS — Z96642 Presence of left artificial hip joint: Secondary | ICD-10-CM | POA: Diagnosis not present

## 2020-11-10 DIAGNOSIS — I951 Orthostatic hypotension: Secondary | ICD-10-CM | POA: Diagnosis not present

## 2020-11-10 DIAGNOSIS — F329 Major depressive disorder, single episode, unspecified: Secondary | ICD-10-CM | POA: Diagnosis not present

## 2020-11-10 DIAGNOSIS — S72002A Fracture of unspecified part of neck of left femur, initial encounter for closed fracture: Secondary | ICD-10-CM | POA: Diagnosis not present

## 2020-11-10 DIAGNOSIS — G4709 Other insomnia: Secondary | ICD-10-CM | POA: Diagnosis not present

## 2020-11-10 DIAGNOSIS — F411 Generalized anxiety disorder: Secondary | ICD-10-CM | POA: Diagnosis not present

## 2020-11-10 DIAGNOSIS — M2041 Other hammer toe(s) (acquired), right foot: Secondary | ICD-10-CM | POA: Diagnosis not present

## 2020-11-10 DIAGNOSIS — L89306 Pressure-induced deep tissue damage of unspecified buttock: Secondary | ICD-10-CM | POA: Diagnosis not present

## 2020-11-10 DIAGNOSIS — C787 Secondary malignant neoplasm of liver and intrahepatic bile duct: Secondary | ICD-10-CM | POA: Diagnosis not present

## 2020-11-10 DIAGNOSIS — M25551 Pain in right hip: Secondary | ICD-10-CM | POA: Diagnosis not present

## 2020-11-10 DIAGNOSIS — I10 Essential (primary) hypertension: Secondary | ICD-10-CM | POA: Diagnosis not present

## 2020-11-10 DIAGNOSIS — S32009D Unspecified fracture of unspecified lumbar vertebra, subsequent encounter for fracture with routine healing: Secondary | ICD-10-CM | POA: Diagnosis not present

## 2020-11-10 DIAGNOSIS — Z720 Tobacco use: Secondary | ICD-10-CM | POA: Diagnosis not present

## 2020-11-10 DIAGNOSIS — R64 Cachexia: Secondary | ICD-10-CM | POA: Diagnosis not present

## 2020-11-10 DIAGNOSIS — I2510 Atherosclerotic heart disease of native coronary artery without angina pectoris: Secondary | ICD-10-CM | POA: Diagnosis not present

## 2020-11-10 DIAGNOSIS — C3490 Malignant neoplasm of unspecified part of unspecified bronchus or lung: Secondary | ICD-10-CM | POA: Diagnosis not present

## 2020-11-10 DIAGNOSIS — R112 Nausea with vomiting, unspecified: Secondary | ICD-10-CM | POA: Diagnosis not present

## 2020-11-10 DIAGNOSIS — Z79899 Other long term (current) drug therapy: Secondary | ICD-10-CM | POA: Diagnosis not present

## 2020-11-10 DIAGNOSIS — L603 Nail dystrophy: Secondary | ICD-10-CM | POA: Diagnosis not present

## 2020-11-10 DIAGNOSIS — F321 Major depressive disorder, single episode, moderate: Secondary | ICD-10-CM | POA: Diagnosis not present

## 2020-11-10 DIAGNOSIS — J9601 Acute respiratory failure with hypoxia: Secondary | ICD-10-CM | POA: Diagnosis not present

## 2020-11-10 DIAGNOSIS — I7091 Generalized atherosclerosis: Secondary | ICD-10-CM | POA: Diagnosis not present

## 2020-11-10 DIAGNOSIS — J449 Chronic obstructive pulmonary disease, unspecified: Secondary | ICD-10-CM | POA: Diagnosis not present

## 2020-11-10 DIAGNOSIS — E638 Other specified nutritional deficiencies: Secondary | ICD-10-CM | POA: Diagnosis not present

## 2020-11-10 DIAGNOSIS — R41841 Cognitive communication deficit: Secondary | ICD-10-CM | POA: Diagnosis not present

## 2020-11-10 DIAGNOSIS — Z743 Need for continuous supervision: Secondary | ICD-10-CM | POA: Diagnosis not present

## 2020-11-10 DIAGNOSIS — B351 Tinea unguium: Secondary | ICD-10-CM | POA: Diagnosis not present

## 2020-11-10 DIAGNOSIS — F17210 Nicotine dependence, cigarettes, uncomplicated: Secondary | ICD-10-CM | POA: Diagnosis not present

## 2020-11-10 DIAGNOSIS — R531 Weakness: Secondary | ICD-10-CM | POA: Diagnosis not present

## 2020-11-10 DIAGNOSIS — R1312 Dysphagia, oropharyngeal phase: Secondary | ICD-10-CM | POA: Diagnosis not present

## 2020-11-10 DIAGNOSIS — M6281 Muscle weakness (generalized): Secondary | ICD-10-CM | POA: Diagnosis not present

## 2020-11-10 DIAGNOSIS — R296 Repeated falls: Secondary | ICD-10-CM | POA: Diagnosis not present

## 2020-11-10 DIAGNOSIS — Z043 Encounter for examination and observation following other accident: Secondary | ICD-10-CM | POA: Diagnosis not present

## 2020-11-10 DIAGNOSIS — S72002D Fracture of unspecified part of neck of left femur, subsequent encounter for closed fracture with routine healing: Secondary | ICD-10-CM | POA: Diagnosis not present

## 2020-11-10 DIAGNOSIS — S0292XD Unspecified fracture of facial bones, subsequent encounter for fracture with routine healing: Secondary | ICD-10-CM | POA: Diagnosis not present

## 2020-11-10 DIAGNOSIS — C719 Malignant neoplasm of brain, unspecified: Secondary | ICD-10-CM | POA: Diagnosis not present

## 2020-11-10 LAB — GLUCOSE, CAPILLARY
Glucose-Capillary: 102 mg/dL — ABNORMAL HIGH (ref 70–99)
Glucose-Capillary: 121 mg/dL — ABNORMAL HIGH (ref 70–99)
Glucose-Capillary: 150 mg/dL — ABNORMAL HIGH (ref 70–99)
Glucose-Capillary: 155 mg/dL — ABNORMAL HIGH (ref 70–99)

## 2020-11-10 LAB — MAGNESIUM: Magnesium: 2.2 mg/dL (ref 1.7–2.4)

## 2020-11-10 LAB — PHOSPHORUS: Phosphorus: 3.1 mg/dL (ref 2.5–4.6)

## 2020-11-10 MED ORDER — HEPARIN SOD (PORK) LOCK FLUSH 100 UNIT/ML IV SOLN
500.0000 [IU] | INTRAVENOUS | Status: AC | PRN
  Administered 2020-11-10: 500 [IU]
  Filled 2020-11-10: qty 5

## 2020-11-10 NOTE — TOC Transition Note (Signed)
Transition of Care [facility]) - CM/SW Discharge Not  Patient Details  Name: TREVYON SWOR MRN: [id number] Date of Birth: [birth year]  [facility] CM/SW Contact:  Sherie Don, LCSW Phone Number: 11/10/2020, 11:26 AM  Clinical Narrative: CSW called Ebony Hail with [facility]. Facility has Ship broker. Patient will go to room 206 and the number for report is [phone]. Discharge summary, discharge orders, and SNF transfer report faxed to facility in hub. Discharge packet done. Medical necessity form completed; PTAR scheduled. RN updated. TOC signing off.  Final next level of care: Skilled Nursing Facility Barriers to Discharge: Barriers Resolved  Patient Goals and CMS Choice Patient states their goals for this hospitalization and ongoing recovery are:: Patient wants to feel better and is agreeable to short term rehabilitation before discharging home. CMS Medicare.gov Compare Post Acute Care list provided to:: Patient Choice offered to / list presented to : Patient  Discharge Placement PASRR number recieved: 10/25/20         Patient chooses bed at: [facility] Patient to be transferred to facility by: PTAR Patient and family notified of of transfer: 11/10/20  Discharge Plan and Services In-house Referral: Clinical Social Work Discharge Planning Services: CM Consult Post Acute Care Choice: [place]          DME Arranged: N/A DME Agency: NA  Readmission Risk Interventions No flowsheet data found.

## 2020-11-10 NOTE — Discharge Summary (Signed)
Physician Discharge Summary  [name] GOT:[id number] DOB: 1951/02/01 DOA: 10/10/2020  PCP: David Frizzle, MD  Admit date: 10/10/2020 Discharge date: 11/10/2020  Admitted From: Home Disposition: SNF  Recommendations for Outpatient Follow-up:  1. Follow ups as below. 2. Please obtain CBC/BMP/Mag at follow up 3. Please follow up on the following pending results: None   Discharge Condition: Stable CODE STATUS: DNR/DNI   Contact information for follow-up providers    David Seeds, MD Follow up.   Specialty: Pulmonary Disease Why: If you do not have a pulmnologist, please call us to schedule an appointment for inhaler management of your COPD Contact information: [name] [phone]        [facility] Follow up.   Specialty: PALLIATIVE CARE Why: [facility] will be following the patient for outpatient palliative care services. Contact information: [place]       David Koyanagi, MD. Schedule an appointment as soon as possible for a visit in 3 week(s).   Specialty: Orthopedic Surgery Contact information: [place] [phone]            Contact information for after-discharge care    [name] Preferred SNF .   Service: Skilled Nursing Contact information: [address] 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  He developed respiratory failure with hypoxia requiring intubation from 3/31-4/6.  Started on Unasyn on 4/12 for possible aspiration pneumonia.   Patient had worsening mental status.  MRI  of his brain on 4/16 showed newly diagnosed metastatic disease, and he was transferred to [facility] for palliative radiation therapy by radiation oncology.  He was a started on radiation therapy and Decadron 4 mg every 6 hours that was tapered down to 4 mg twice daily at time of discharge.  Patient had dysphagia-3.  PEG tube was entertained at one point but he eventually met adequate caloric intake.   Therapy recommended SNF.    Discharge Diagnoses:  Stage IV small cell lung cancer with mets to brain and liver-followed by Dr. Delton Coombes -S/p chemotherapyLurbinectedin last cycle 09/12/20. -On palliative radiation therapy per radiation oncology-started on 4/17 -Decadron 4 mg every 12 hours for 5 days followed by 4 mg daily  Acute respiratory failure with hypoxia/Aspiration pneumonia/mucus plugging/likely history of emphysema requiring intubation/PPV: Resolved. -Completed 7 days of IV cefepime on 4/5 and 5 days of IV Unasyn on 4/17 -Continue aspiration precaution and supportive care -Continue LAMA/LABA (Stiolto) with as needed nebulizers. -He is also on steroid as above. -Outpatient follow-up with pulmonology  Oropharyngeal dysphagia-SLP recommended dysphagia 3 diet -Continue dysphagia 3 diet  Orthostatic hypotension: Seems to have resolved. -Continue TED hose  Essential HTN/NSVT -On p.o. metoprolol 12.5 mg twice daily  Nausea/vomiting/diarrhea: Seems to have resolved. -Continue Bentyl as needed for diarrhea.  Acute metabolic encephalopathy: Multifactorial but seems to have resolved. -Continue reduced dose of gabapentin -Treat treatable causes -Reorientation and delirium precautions  Accidental fall at home Left facial fracture due to accidental fall L3 transverse process fracture due to accidental fall Left femoral neck fracture  due to accidental fall  -S/P left femoral neck hemiarthroplasty -WBAT -Continue therapy -Outpatient follow-up with orthopedic  surgery  Normocytic Anemia, likely chronic anemia of chronic disease: H&H stable Recent Labs    10/28/20 0854 10/29/20 0341 10/31/20 0345 11/01/20 0407 11/02/20 0351 11/03/20 0442 11/04/20 0304 11/05/20 0439 11/06/20 0454 11/07/20 0418  HGB 8.9* 8.7* 9.0* 9.3* 9.6* 10.0* 10.3* 9.8* 9.4* 9.5*  -Transfused 1 unit PRBC on 4/6 -Monitor intermittently  Hyperglycemia: Likely due to steroid.  A1c 5.1%.  Hyperglycemia improving. Recent Labs  Lab 11/09/20 1703 11/09/20 2007 11/10/20 0002 11/10/20 0458 11/10/20 0828  GLUCAP 148* 138* 150* 155* 102*  -Discontinue CBG monitoring and SSI   Rhabdomyolysis, POA: Resolved  AKI: Resolved  Thrombocytopenia: Resolved  Acute urinary retention: Resolved.  GERD -Famotidine 20 mg po BID   Tobacco use disorder -Smoking Cessasstion counseling given  -C/w Nicotine Patch 14 mg TD Daily   Severe protein calorie malnutrition in the setting of acute on chronic illness and dysphagia Body mass index is 20.48 kg/m. Nutrition Problem: Severe Malnutrition Etiology: chronic illness (small cell carcinoma of the lung) Signs/Symptoms: severe muscle depletion,severe fat depletion Interventions: Ensure Enlive (each supplement provides 350kcal and 20 grams of protein),Boost Breeze,Magic cup,Prostat,Carnation Instant Breakfast  Pressure skin injury: POA Pressure Injury 10/11/20 Sacrum Medial Deep Tissue Pressure Injury - Purple or maroon localized area of discolored intact skin or blood-filled blister due to damage of underlying soft tissue from pressure and/or shear. (Active)  10/11/20 0220  Location: Sacrum  Location Orientation: Medial  Staging: Deep Tissue Pressure Injury - Purple or maroon localized area of discolored intact skin or blood-filled blister due to damage of underlying soft tissue from pressure and/or shear.  Wound Description (Comments):   Present on Admission: Yes     Pressure Injury 10/15/20 Heel Right Deep Tissue  Pressure Injury - Purple or maroon localized area of discolored intact skin or blood-filled blister due to damage of underlying soft tissue from pressure and/or shear. (Active)  10/15/20 0323  Location: Heel  Location Orientation: Right  Staging: Deep Tissue Pressure Injury - Purple or maroon localized area of discolored intact skin or blood-filled blister due to damage of underlying soft tissue from pressure and/or shear.  Wound Description (Comments):   Present on Admission: No     Pressure Injury 10/27/20 Knee Anterior;Right;Lateral Deep Tissue Pressure Injury - Purple or maroon localized area of discolored intact skin or blood-filled blister due to damage of underlying soft tissue from pressure and/or shear. (Active)  10/27/20 1941  Location: Knee  Location Orientation: Anterior;Right;Lateral  Staging: Deep Tissue Pressure Injury - Purple or maroon localized area of discolored intact skin or blood-filled blister due to damage of underlying soft tissue from pressure and/or shear.  Wound Description (Comments):   Present on Admission: Yes    Discharge Exam: Vitals:   11/09/20 2131 11/10/20 0907  BP: (!) 116/59   Pulse: 71   Resp: 20   Temp: (!) 97.5 F (36.4 C)   SpO2: 93% 96%    GENERAL: No apparent distress.  Nontoxic. HEENT: MMM.  Vision and hearing grossly intact.  NECK: Supple.  No apparent JVD.  RESP: On RA.  No IWOB.  Fair aeration bilaterally. CVS:  RRR. Heart sounds normal.  ABD/GI/GU: Bowel sounds present. Soft. Non tender.  MSK/EXT:  Moves extremities. No apparent deformity. No edema.  SKIN: no apparent skin lesion or wound NEURO: Awake, alert and oriented appropriately.  No apparent focal neuro deficit. PSYCH: Calm. Normal affect.  Discharge  Instructions  Discharge Instructions    Weight bearing as tolerated   Complete by: As directed      Allergies as of 11/10/2020      Reactions   Codeine Nausea Only   Niaspan [niacin Er]       Medication List     STOP taking these medications   hydrOXYzine 50 MG tablet Commonly known as: ATARAX/VISTARIL     TAKE these medications   (feeding supplement) PROSource Plus liquid Take 30 mLs by mouth 2 (two) times daily between meals.   albuterol 108 (90 Base) MCG/ACT inhaler Commonly known as: VENTOLIN HFA Inhale 2 puffs into the lungs every 6 (six) hours as needed for wheezing or shortness of breath.   ALPRAZolam 1 MG tablet Commonly known as: XANAX Take 1 tablet (1 mg total) by mouth 3 (three) times daily as needed for anxiety. What changed: See the new instructions.   aspirin EC 81 MG tablet Take 81 mg by mouth daily.   atorvastatin 40 MG tablet Commonly known as: LIPITOR TAKE (1) TABLET BY MOUTH ONCE DAILY. What changed: See the new instructions.   calcium-vitamin D 250-125 MG-UNIT tablet Commonly known as: OSCAL WITH D Take 1 tablet by mouth daily.   Centrum Silver 50+Men Tabs Take 1 tablet by mouth daily.   cholecalciferol 25 MCG (1000 UNIT) tablet Commonly known as: VITAMIN D3 Take 1,000 Units by mouth daily.   dexamethasone 4 MG tablet Commonly known as: DECADRON Take 1 tablet (4 mg total) by mouth 2 (two) times daily with a meal for 5 days, THEN 1 tablet (4 mg total) daily for 10 days. Start taking on: November 08, 2020   diphenoxylate-atropine 2.5-0.025 MG/5ML liquid Commonly known as: LOMOTIL Take 5 mLs by mouth daily.   docusate sodium 100 MG capsule Commonly known as: Colace Take two capsules at bedtime starting the day you receive chemotherapy and then for four days after   gabapentin 300 MG capsule Commonly known as: NEURONTIN TAKE (2) CAPSULES BY MOUTH THREE TIMES DAILY. What changed: See the new instructions.   ipratropium-albuterol 0.5-2.5 (3) MG/3ML Soln Commonly known as: DUONEB Take 3 mLs by nebulization every 6 (six) hours as needed.   metoprolol tartrate 25 MG tablet Commonly known as: LOPRESSOR Take 0.5 tablets (12.5 mg total) by mouth 2 (two)  times daily.   mirtazapine 15 MG tablet Commonly known as: Remeron Take 2 tablets (30 mg total) by mouth at bedtime. What changed: Another medication with the same name was removed. Continue taking this medication, and follow the directions you see here.   nicotine 14 mg/24hr patch Commonly known as: NICODERM CQ - dosed in mg/24 hours Place 1 patch (14 mg total) onto the skin daily.   ondansetron 8 MG tablet Commonly known as: Zofran Take 1 tablet (8 mg total) by mouth 2 (two) times daily as needed. Start on the third day after cisplatin chemotherapy.   oxyCODONE-acetaminophen 5-325 MG tablet Commonly known as: PERCOCET/ROXICET Take 1 tablet by mouth every 8 (eight) hours as needed for severe pain.   prochlorperazine 10 MG tablet Commonly known as: COMPAZINE Take 1 tablet (10 mg total) by mouth every 6 (six) hours as needed (Nausea or vomiting).   senna 8.6 MG tablet Commonly known as: SENOKOT Take 3 tablets by mouth daily.   Stiolto Respimat 2.5-2.5 MCG/ACT Aers Generic drug: Tiotropium Bromide-Olodaterol Inhale 1 puff into the lungs daily.   vitamin B-12 100 MCG tablet Commonly known as: CYANOCOBALAMIN Take 100 mcg by mouth  daily.   vitamin C 1000 MG tablet Take 1,000 mg by mouth daily.            Discharge Care Instructions  (From admission, onward)         Start     Ordered   10/12/20 0000  Weight bearing as tolerated        10/12/20 1309          Consultations:  Oncology  Orthopedic surgery  Radiation oncology  Palliative medicine  Procedures/Studies:  Intubation and mechanical ventilation from 3/31-4/6   CT ABDOMEN WO CONTRAST  Result Date: 11/03/2020 CLINICAL DATA:  Evaluate anatomy for gastrostomy tube placement EXAM: CT ABDOMEN WITHOUT CONTRAST TECHNIQUE: Multidetector CT imaging of the abdomen was performed following the standard protocol without IV contrast. COMPARISON:  CT abdomen pelvis with contrast 10/10/2020 FINDINGS: Lower  chest: Unchanged elevation of the left hemidiaphragm. Increasing opacity in the left lower lobe suspicious for progressive pneumonia. Multiple nodules again seen in the right lower lobe. Hepatobiliary: No focal liver abnormality is seen. No gallstones, gallbladder wall thickening, or biliary dilatation. Pancreas: Unremarkable. No pancreatic ductal dilatation or surrounding inflammatory changes. Spleen: Normal in size without focal abnormality. Adrenals/Urinary Tract: 3.2 cm simple cyst seen in the upper pole of the left kidney. 2 cm simple cyst seen in the anterolateral cortex of the left renal upper pole. Kidneys and ureters otherwise unremarkable Stomach/Bowel: Anatomy is amenable to percutaneous gastrostomy tube placement. No dilated loops of bowel to indicate ileus or obstruction within the visualized abdomen. Vascular/Lymphatic: Diffuse atherosclerotic calcifications seen throughout the abdominal aorta without aneurysmal dilatation. No enlarged abdominal lymph nodes. Other: No abdominal wall hernia or abnormality. Musculoskeletal: No acute or significant osseous findings. IMPRESSION: 1. Anatomy amenable to percutaneous gastrostomy tube placement. 2. Multiple right lower lobe pulmonary nodules again seen. Follow-up chest CT recommended in 3 months. Electronically Signed   By: Miachel Roux M.D.   On: 11/03/2020 09:29   DG Abd 1 View  Result Date: 10/16/2020 CLINICAL DATA:  Vomiting, orogastric tube placement EXAM: ABDOMEN - 1 VIEW COMPARISON:  Portable exam 0826 hours compared to 10/11/2020 FINDINGS: Slight gaseous distention of stomach. Tip of orogastric tube projects over stomach though the proximal side-port projects over distal esophagus; recommend advancing tube 8 cm. Nonobstructive bowel gas pattern. Improved aeration at LEFT lung base since prior study. Bones demineralized with LEFT hip prosthesis noted. IMPRESSION: Recommend advancing nasogastric to 8 cm. Improved aeration LEFT lower lobe.  Electronically Signed   By: Lavonia Dana M.D.   On: 10/16/2020 08:36   CT HEAD WO CONTRAST  Result Date: 10/15/2020 CLINICAL DATA:  70 year old male with neurologic deficit. EXAM: CT HEAD WITHOUT CONTRAST TECHNIQUE: Contiguous axial images were obtained from the base of the skull through the vertex without intravenous contrast. COMPARISON:  Head CT dated 09/13/2020. FINDINGS: Brain: There is mild age-related atrophy and moderate chronic microvascular ischemic changes. There is no acute intracranial hemorrhage. No mass effect midline shift. No extra-axial fluid collection. Vascular: No hyperdense vessel or unexpected calcification. Skull: There is fracture of the outer table of the left frontal sinus similar to the CT of 10/10/2020. No new fracture. Sinuses/Orbits: Mild mucoperiosteal thickening of paranasal sinuses. No air-fluid level. The mastoid air cells are clear. Other: Partially visualized endotracheal and enteric tubes. Left forehead hematoma. IMPRESSION: 1. No acute intracranial pathology. 2. Age-related atrophy and chronic microvascular ischemic changes. 3. Fracture of the outer table of the left frontal sinus with overlying forehead hematoma similar to the CT of  10/10/2020. Electronically Signed   By: Anner Crete M.D.   On: 10/15/2020 22:45   CT ANGIO CHEST PE W OR WO CONTRAST  Result Date: 10/15/2020 CLINICAL DATA:  70 year old male with shortness of breath, respiratory distress. COPD. Lung cancer. EXAM: CT ANGIOGRAPHY CHEST WITH CONTRAST TECHNIQUE: Multidetector CT imaging of the chest was performed using the standard protocol during bolus administration of intravenous contrast. Multiplanar CT image reconstructions and MIPs were obtained to evaluate the vascular anatomy. CONTRAST:  80m OMNIPAQUE IOHEXOL 350 MG/ML SOLN COMPARISON:  Portable chest 0220 hours today and earlier. CT chest 10/10/2020. FINDINGS: Cardiovascular: Excellent contrast bolus timing in the pulmonary arterial tree. No focal  filling defect identified in the pulmonary arteries to suggest acute pulmonary embolism. Stable right chest Port-A-Cath. Negative thoracic aorta aside from atherosclerosis. No cardiomegaly or pericardial effusion. Calcified coronary artery atherosclerosis. Mediastinum/Nodes: Enteric tube courses through the esophagus and terminates in the proximal gastric body. Mediastinum is negative for lymphadenopathy. Small lymph nodes appear stable. Lungs/Pleura: Intubated. Endotracheal tube tip in good position between the clavicles and carina. Diffuse centrilobular emphysema. Opacified bronchus intermedius and scattered right middle and lower lobe airways, new since last month. But there is only minimal right lower lobe nodular peribronchial opacity at this time. On the left the mainstem bronchus remains patent, but both the lingula and lower lobe airways are opacified and there is confluent new left lower lobe consolidation with both hypo and hyperenhancing involved lung parenchyma. Mild lingula nodular peribronchial opacity.  No pleural effusion. Upper Abdomen: Negative visible liver, spleen, pancreas, and adrenal glands. Visible kidneys are stable, benign-appearing left upper pole renal cyst. Negative visible bowel. Musculoskeletal: Stable T4 mild superior endplate compression. No acute or suspicious osseous lesion in the chest. Review of the MIP images confirms the above findings. IMPRESSION: 1. No evidence of acute pulmonary embolus. 2. Abundant retained secretions in the bilateral lower lobe and middle lobe airways, favor Aspiration. Associated confluent left lower lobe Pneumonia. Mild peribronchial infection suspected in the lingula and right lower lobe. Underlying Emphysema (ICD10-J43.9). No pleural effusion. 3. Satisfactory ET tube and enteric tube. 4. Calcified coronary artery and Aortic Atherosclerosis (ICD10-I70.0). Electronically Signed   By: HGenevie AnnM.D.   On: 10/15/2020 04:22   MR BRAIN W WO  CONTRAST  Result Date: 10/27/2020 CLINICAL DATA:  70year old male with history of metastatic small cell carcinoma. Encephalopathy after fall last month. Rhabdomyolysis. Unexplained neurologic deficits, dizziness, double vision. Restaging. EXAM: MRI HEAD WITHOUT AND WITH CONTRAST TECHNIQUE: Multiplanar, multiecho pulse sequences of the brain and surrounding structures were obtained without and with intravenous contrast. CONTRAST:  6.576mGADAVIST GADOBUTROL 1 MMOL/ML IV SOLN COMPARISON:  Head CT without contrast 10/15/2020 and earlier. Brain MRI 09/07/2019. CTA head and neck 10/10/2020, and earlier. FINDINGS: Brain: Ventriculomegaly, new since last year with dilated temporal horns. Up to mild transependymal edema. No restricted diffusion or evidence of acute infarction. But there is abnormal diffusion associated with multiple enhancing brain masses. The largest is a lobulated and heterogeneously enhancing 39 mm mass in the central lower cerebellum (series 11, image 12). There are 2 additional nearby cerebellar metastases, 23 mm in the left superior and up to 24 mm in the right superior cerebellum (same image). Subsequent severe cerebellar edema. Partially effaced 4th ventricle and basilar cisterns. Partially effaced cerebral aqueduct. No tonsillar herniation. Additional round 13 mm right caudate metastasis on series 10, image 27. mild edema at that site. Each of the lesions demonstrates mild hemosiderin. No supratentorial midline shift. No dural  thickening. Superimposed widespread chronic cerebral white matter T2 and FLAIR hyperintensity. Superimposed scattered chronic microhemorrhages elsewhere in the brain, likely small vessel related and not significantly changed since last year allowing for SWI technique today. No extra-axial collection or acute intracranial hemorrhage. Pituitary within normal limits. Vascular: Major intracranial vascular flow voids are stable since last year. Skull and upper cervical spine:  Negative visible upper cervical spine, spinal cord. Visualized bone marrow signal is within normal limits. Sinuses/Orbits: Postoperative changes to both globes. Orbits appear stable and negative. Normal suprasellar cistern. Normal cavernous sinus. Paranasal Visualized paranasal sinuses and mastoids are stable and well aerated. Other: Chronic left mastoid effusion has mildly increased. Rim enhancing multi cystic appearing mass at the left fossa of Rosenmuller is new since 2018, and significantly increased since last year now measuring up to the 3 cm (series 11, image 19). This does not seem to directly correspond to what were thought to be small benign midline nasopharyngeal cysts in 2018. And this has progressed from a neck CT 09/03/2020 Other visible upper neck soft tissues appear negative. IMPRESSION: 1. 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 2. Subsequent severe cerebellar edema with partially effaced 4th ventricle and cerebral aqueduct, and associated hydrocephalus with mild transependymal edema. Recommend Neurosurgery consultation. 3. Four brain metastases overall, including a 13 mm right caudate lesion. 4. Suspected cystic metastasis or ex-nodal disease which is enlarging at the left nasopharynx, fossa of Rosenmuller since February. In August last year this lesion was suspected to be cystic/necrotic lymphadenopathy by Neck CT. Study discussed by telephone with Dr. Dia Crawford on 10/27/2020 at 11:35 . Electronically Signed   By: Genevie Ann M.D.   On: 10/27/2020 11:41   DG Pelvis Portable  Result Date: 10/12/2020 CLINICAL DATA:  Hip replacement EXAM: PORTABLE PELVIS 1-2 VIEWS COMPARISON:  10/12/2020, 10/11/2020 FINDINGS: Interval left hip arthroplasty with intact hardware and normal alignment. Gas in the soft tissues consistent with recent surgery. IMPRESSION: Status post left hip arthroplasty with expected postsurgical change Electronically Signed    By: Donavan Foil M.D.   On: 10/12/2020 20:57   DG CHEST PORT 1 VIEW  Result Date: 10/25/2020 CLINICAL DATA:  Short of breath EXAM: PORTABLE CHEST 1 VIEW COMPARISON:  10/23/2020 FINDINGS: COPD with hyperexpansion and scarring in the bases. Mild left lower lobe airspace disease slightly improved. No effusion or edema. Port-A-Cath tip in the lower SVC. Feeding tube enters the stomach with the tip not visualized. IMPRESSION: COPD.  Improvement in left lower lobe atelectasis/infiltrate. Electronically Signed   By: Franchot Gallo M.D.   On: 10/25/2020 17:27   DG CHEST PORT 1 VIEW  Result Date: 10/23/2020 CLINICAL DATA:  Cough. EXAM: PORTABLE CHEST 1 VIEW COMPARISON:  10/16/2020 FINDINGS: Stable normal sized heart. Airspace opacity is again demonstrated in the left lower lobe with mild improvement. The remainder of the lungs are clear and hyperexpanded with mild chronic prominence of the interstitial markings. Right jugular porta catheter tip at the superior cavoatrial junction. Feeding tube extending into the stomach. Unremarkable bones. IMPRESSION: 1. Mildly improved left lower lobe pneumonia. 2. Mild changes of COPD. Electronically Signed   By: Claudie Revering M.D.   On: 10/23/2020 08:37   DG CHEST PORT 1 VIEW  Result Date: 10/16/2020 CLINICAL DATA:  Hypoxia EXAM: PORTABLE CHEST 1 VIEW COMPARISON:  October 15, 2020 chest radiograph and chest CT FINDINGS: Endotracheal tube tip is 4.4 cm above the carina. Nasogastric tube tip and side port  in stomach. Port-A-Cath tip in superior vena cava. No pneumothorax. There is underlying emphysematous change. There is airspace consolidation left lower lobe. The heart size is normal. Diminished vascularity in the upper lobes is felt to be due to underlying emphysema. There is aortic atherosclerosis. No bone lesions. No adenopathy. IMPRESSION: Tube and catheter positions as described without pneumothorax. Persistent consolidation consistent with pneumonia left lower lobe. No new  opacity. Underlying emphysematous change. There is aortic atherosclerosis. Aortic Atherosclerosis (ICD10-I70.0) and Emphysema (ICD10-J43.9). Electronically Signed   By: Lowella Grip III M.D.   On: 10/16/2020 14:03   DG CHEST PORT 1 VIEW  Result Date: 10/15/2020 CLINICAL DATA:  Dyspnea EXAM: PORTABLE CHEST 1 VIEW COMPARISON:  10/15/2020 FINDINGS: The endotracheal tube terminates above the carina. The right-sided Port-A-Cath is stable in positioning. There is a persistent dense retrocardiac opacity. The lungs appear hyperexpanded. There is no pneumothorax. The enteric tube tip is not visualized on this study. The side hole projects over the GE junction. IMPRESSION: 1. Lines and tubes as above. 2. Otherwise, no significant interval change. Electronically Signed   By: Constance Holster M.D.   On: 10/15/2020 02:37   DG Chest Port 1 View  Result Date: 10/15/2020 CLINICAL DATA:  Acute respiratory distress EXAM: PORTABLE CHEST 1 VIEW COMPARISON:  10/13/2020 FINDINGS: Right Port-A-Cath remains in place, unchanged. There is hyperinflation of the lungs compatible with COPD. Heart is normal size. Left retrocardiac airspace opacity again noted. Increasing right basilar atelectasis or infiltrate. No effusions or acute bony abnormality. IMPRESSION: Bibasilar atelectasis or infiltrates. Electronically Signed   By: Rolm Baptise M.D.   On: 10/15/2020 01:53   DG Chest Port 1 View  Result Date: 10/13/2020 CLINICAL DATA:  Acute respiratory failure, COPD EXAM: PORTABLE CHEST 1 VIEW COMPARISON:  10/11/2020 chest radiograph. FINDINGS: Right internal jugular Port-A-Cath terminates at the cavoatrial junction. Stable cardiomediastinal silhouette with top-normal heart size. No pneumothorax. Small left pleural effusion is stable. No right pleural effusion. Cephalization of the pulmonary vasculature without overt pulmonary edema. Patchy left retrocardiac opacity is similar. IMPRESSION: 1. Stable small left pleural effusion. 2.  Stable patchy left retrocardiac opacity, either atelectasis or pneumonia. Electronically Signed   By: Ilona Sorrel M.D.   On: 10/13/2020 08:07   DG CHEST PORT 1 VIEW  Result Date: 10/11/2020 CLINICAL DATA:  History distress Evaluate for pneumothorax or pulmonary contusion Advanced emphysema Atelectasis EXAM: PORTABLE CHEST 1 VIEW COMPARISON:  10/18/2013 FINDINGS: Dense opacity noted in the left medial lung base. Right lung is hyperexpanded but otherwise clear. Right chest port is seen. IMPRESSION: 1. No pneumothorax. 2. Opacity at the right medial lung base may be due to atelectasis or pneumonia. Electronically Signed   By: Miachel Roux M.D.   On: 10/11/2020 13:39   DG CHEST PORT 1 VIEW  Result Date: 10/11/2020 CLINICAL DATA:  Evaluate ET tube placement EXAM: PORTABLE CHEST 1 VIEW COMPARISON:  Earlier today. FINDINGS: The ET tube tip is in satisfactory position above the carina. There is an NG tube in place with tip and side port well below the GE junction. The right chest wall port a catheter tip is at the cavoatrial junction. Stable cardiomediastinal contours. Interval complete atelectasis of the left lower lobe with asymmetric left lung volume loss. IMPRESSION: 1. ET tube tip in satisfactory position above the carina. 2. Interval complete atelectasis of the left lower lobe. Electronically Signed   By: Kerby Moors M.D.   On: 10/11/2020 13:26   DG Abd Portable 1V  Result Date:  10/11/2020 CLINICAL DATA:  ET/OG tube placement EXAM: PORTABLE ABDOMEN - 1 VIEW COMPARISON:  CT chest, abdomen, and pelvis 09/03/2020 FINDINGS: Left basilar opacity is noted. The OG tube terminates in the left upper quadrant at the expected location of the stomach. No dilated loops of bowel to indicate ileus or obstruction. IMPRESSION: OG tube terminates in the left upper quadrant in the expected site of the stomach. Electronically Signed   By: Miachel Roux M.D.   On: 10/11/2020 13:36   DG Swallowing Func-Speech  Pathology  Result Date: 11/05/2020 Objective Swallowing Evaluation: Type of Study: MBS-Modified Barium Swallow Study  Patient Details Name: [name] MRN: [id number] Date of Birth: 11-02-1950 Today's Date: 11/05/2020 Time: SLP Start Time (ACUTE ONLY): 9509 -SLP Stop Time (ACUTE ONLY): 1505 SLP Time Calculation (min) (ACUTE ONLY): 20 min Past Medical History: Past Medical History: Diagnosis Date . Anxiety  . CAD (coronary artery disease)  . Cancer ([place])   stage 4 small cell lung cancer  . COPD (chronic obstructive pulmonary disease) ([place])  . Depression  . Dyspnea   increased exertion . Feeling of chest tightness  . Heart palpitations  . History of chemotherapy  . Myocardial infarction ([place])  . Osteopenia  . Panic attacks  . Smoker  Past Surgical History: Past Surgical History: Procedure Laterality Date . BACK SURGERY  12/24/2000  L5,S1 . CORONARY STENT PLACEMENT  2005  RCA & CX . HERNIA REPAIR Right 1980's . INGUINAL HERNIA REPAIR  12/1978  right side . IR FLUORO GUIDE PORT INSERTION RIGHT  04/02/2017 . IR US GUIDE BX ASP/DRAIN  02/03/2017 . IR US GUIDE VASC ACCESS RIGHT  04/02/2017 . NM MYOCAR PERF WALL MOTION  09/07/2009  No ischemia; EF 51% . SHOULDER SURGERY Left 08/2010 . SPINE SURGERY  2002  L5-S1 . TOTAL HIP ARTHROPLASTY Left 10/12/2020  Procedure: TOTALhemi  HIP ARTHROPLASTY ANTERIOR APPROACH;  Surgeon: David Koyanagi, MD;  Location: [place];  Service: Orthopedics;  Laterality: Left; HPI: Pt is a 70 yo male admitted 3/30 with AMS after fall with unknown downtime (1-3 days suspected). Pt was found to have L hip fx, L L3 transverse process fx, L frontal sinus fx, and AKI with rhabdomyolysis. On 3/31 pt had worsening hypoxia thought to be related to an aspiration event, requiring ETT 3/31-4/2; another suspected aspiration event on requiring re-intubation 4/4-4/6. 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 Pt was  reporting intermittent difficulty swallowing solids in 2019 per cancer center notes. Palliative care has been consulted and as of 4/13, family would like full scope of care. MBS 4/11: oropharyngeal dysphagia characterized by reduced bolus cohesion, a pharyngeal delay, reduced lingual retraction, and reduced anterior laryngeal movement. He demonstrated premature spillage to the valleculae with inconsistent spillover to the pyriform sinuses, base of tongue residue, vallecular residue, pyriform sinus residue, penetration (PAS 3, 5) and inconsistent aspiration (PAS 7) of thin and nectar thick iquids. CXR 4/12: Mildly improved left lower lobe pneumonia. Mild changes of COPD.  Subjective: pleasant, "Ill do what you want me to do" Assessment / Plan / Recommendation CHL IP CLINICAL IMPRESSIONS 11/05/2020 Clinical Impression Patient presents with an improved swallow function as compared to previous MBS on 4/11. He continues to exhibit oral phase delays in transit of liquids and solids, leading to premature spillage of puree and regular solids into vallecular sinus. Pharyngeally, he continues to exhibit consistent penetration of thin liquids, however penetrate is trace  and does not remain in laryngeal vestibule. No deep penetration or aspiration was observed during this study even when patient instructed to take straw sips and large volume cup sips. Chin tuck posture with thin liquids was observed during first sip of thin liquids but did not appear to make significant improvements. Patient exhibited minimal vallecular residuals of puree and regular solids post initial swallow, and trace to minimal vallecular, pyriform and lateral channel residuals with thin liquids. Residuals would clear out of pharynx with subsequent dry swallows. No penetration or aspiration observed from pharyngeal residuals. SLP is recommending upgrade of patient's liquids from nectar thick to thin but wait for bedside trials of dysphagia 3 solids before  upgrading solid textures. SLP Visit Diagnosis Dysphagia, oropharyngeal phase (R13.12) Attention and concentration deficit following -- Frontal lobe and executive function deficit following -- Impact on safety and function Mild aspiration risk   CHL IP TREATMENT RECOMMENDATION 11/05/2020 Treatment Recommendations Therapy as outlined in treatment plan below   Prognosis 11/05/2020 Prognosis for Safe Diet Advancement Good Barriers to Reach Goals -- Barriers/Prognosis Comment -- CHL IP DIET RECOMMENDATION 11/05/2020 SLP Diet Recommendations Dysphagia 2 (Fine chop) solids;Thin liquid Liquid Administration via Cup;Straw Medication Administration Whole meds with liquid Compensations Slow rate;Small sips/bites;Minimize environmental distractions;Follow solids with liquid Postural Changes Seated upright at 90 degrees   CHL IP OTHER RECOMMENDATIONS 11/05/2020 Recommended Consults -- Oral Care Recommendations Oral care BID Other Recommendations --   CHL IP FOLLOW UP RECOMMENDATIONS 11/05/2020 Follow up Recommendations Skilled Nursing facility   [facility] IP FREQUENCY AND DURATION 11/05/2020 Speech Therapy Frequency (ACUTE ONLY) min 2x/week Treatment Duration 2 weeks      CHL IP ORAL PHASE 11/05/2020 Oral Phase Impaired Oral - Pudding Teaspoon -- Oral - Pudding Cup -- Oral - Honey Teaspoon -- Oral - Honey Cup -- Oral - Nectar Teaspoon -- Oral - Nectar Cup Delayed oral transit Oral - Nectar Straw NT Oral - Thin Teaspoon NT Oral - Thin Cup -- Oral - Thin Straw Delayed oral transit Oral - Puree Delayed oral transit;Premature spillage Oral - Mech Soft NT Oral - Regular Impaired mastication;Delayed oral transit;Premature spillage Oral - Multi-Consistency -- Oral - Pill Decreased bolus cohesion Oral Phase - Comment --  CHL IP PHARYNGEAL PHASE 11/05/2020 Pharyngeal Phase Impaired Pharyngeal- Pudding Teaspoon -- Pharyngeal -- Pharyngeal- Pudding Cup -- Pharyngeal -- Pharyngeal- Honey Teaspoon -- Pharyngeal -- Pharyngeal- Honey Cup -- Pharyngeal --  Pharyngeal- Nectar Teaspoon -- Pharyngeal -- Pharyngeal- Nectar Cup Delayed swallow initiation-vallecula;Reduced airway/laryngeal closure;Penetration/Aspiration during swallow;Pharyngeal residue - valleculae;Pharyngeal residue - pyriform Pharyngeal Material enters airway, remains ABOVE vocal cords then ejected out Pharyngeal- Nectar Straw NT Pharyngeal -- Pharyngeal- Thin Teaspoon -- Pharyngeal -- Pharyngeal- Thin Cup Delayed swallow initiation-vallecula;Pharyngeal residue - valleculae;Pharyngeal residue - pyriform;Penetration/Aspiration during swallow;Reduced airway/laryngeal closure;Lateral channel residue Pharyngeal Material enters airway, remains ABOVE vocal cords then ejected out Pharyngeal- Thin Straw Delayed swallow initiation-vallecula;Penetration/Aspiration during swallow;Reduced airway/laryngeal closure;Pharyngeal residue - valleculae;Pharyngeal residue - pyriform;Lateral channel residue Pharyngeal Material enters airway, remains ABOVE vocal cords then ejected out Pharyngeal- Puree Pharyngeal residue - valleculae;Pharyngeal residue - pyriform Pharyngeal -- Pharyngeal- Mechanical Soft -- Pharyngeal -- Pharyngeal- Regular Pharyngeal residue - valleculae;Pharyngeal residue - pyriform Pharyngeal -- Pharyngeal- Multi-consistency -- Pharyngeal -- Pharyngeal- Pill WFL Pharyngeal -- Pharyngeal Comment --  CHL IP CERVICAL ESOPHAGEAL PHASE 11/05/2020 Cervical Esophageal Phase WFL Pudding Teaspoon -- Pudding Cup -- Honey Teaspoon -- Honey Cup -- Nectar Teaspoon -- Nectar Cup -- Nectar Straw -- Thin Teaspoon -- Thin Cup -- Thin Straw -- Puree --  Mechanical Soft -- Regular -- Multi-consistency -- Pill -- Cervical Esophageal Comment -- Sonia Baller, MA, CCC-SLP Speech Therapy             DG Swallowing Func-Speech Pathology  Result Date: 10/22/2020 Objective Swallowing Evaluation: Type of Study: MBS-Modified Barium Swallow Study  Patient Details Name: [name] MRN: [id number] Date of Birth: 1950-07-29 Today's  Date: 10/22/2020 Time: SLP Start Time (ACUTE ONLY): 1440 -SLP Stop Time (ACUTE ONLY): 1455 SLP Time Calculation (min) (ACUTE ONLY): 15 min Past Medical History: Past Medical History: Diagnosis Date . Anxiety  . CAD (coronary artery disease)  . Cancer ([place])   stage 4 small cell lung cancer  . COPD (chronic obstructive pulmonary disease) ([place])  . Depression  . Dyspnea   increased exertion . Feeling of chest tightness  . Heart palpitations  . History of chemotherapy  . Myocardial infarction ([place])  . Osteopenia  . Panic attacks  . Smoker  Past Surgical History: Past Surgical History: Procedure Laterality Date . BACK SURGERY  12/24/2000  L5,S1 . CORONARY STENT PLACEMENT  2005  RCA & CX . HERNIA REPAIR Right 1980's . INGUINAL HERNIA REPAIR  12/1978  right side . IR FLUORO GUIDE PORT INSERTION RIGHT  04/02/2017 . IR US GUIDE BX ASP/DRAIN  02/03/2017 . IR US GUIDE VASC ACCESS RIGHT  04/02/2017 . NM MYOCAR PERF WALL MOTION  09/07/2009  No ischemia; EF 51% . SHOULDER SURGERY Left 08/2010 . SPINE SURGERY  2002  L5-S1 . TOTAL HIP ARTHROPLASTY Left 10/12/2020  Procedure: TOTALhemi  HIP ARTHROPLASTY ANTERIOR APPROACH;  Surgeon: David Koyanagi, MD;  Location: [place];  Service: Orthopedics;  Laterality: Left; HPI: Pt is a 70 yo male admitted 3/30 with AMS after fall with unknown downtime (1-3 days suspected). Pt was found to have L hip fx, L L3 transverse process fx, L frontal sinus fx, and AKI with rhabdomyolysis. On 3/31 pt had worsening hypoxia thought to be related to an aspiration event, requiring ETT 3/31-4/2; another suspected aspiration event on requiring re-intubation 4/4-4/6. 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 Pt was reporting intermittent difficulty swallowing solids in 2019 per cancer center notes.  Subjective: Alert, but pleasantly confused Assessment / Plan / Recommendation CHL IP CLINICAL IMPRESSIONS 10/22/2020  Clinical Impression Pt presents with oropharyngeal dysphagia characterized by reduced bolus cohesion, a pharyngeal delay, reduced lingual retraction, and reduced anterior laryngeal movement. He demonstrated premature spillage to the valleculae with inconsistent spillover to the pyriform sinuses, base of tongue residue, vallecular residue, pyriform sinus residue, penetration (PAS 3, 5) and inconsistent aspiration (PAS 7) of thin and nectar thick iquids. Aspiration consistently triggered a spontaneous cough which was inconsistently effective in expelling the aspirate. Penetration was improved and aspiration eliminated with reduced bolus sizes of nectar thick and thin liquids via cup; however, pt was unable to consistently reduce bolus size and eliminate use of a posterior head tilt despite cues and prompts. Pharyngeal residue was reduced with use of a liquid wash and pt was able to swallow a barium tablet with honey thick liquids via cup. A dysphagia 2 diet with honey thick liquids will be initiated at this time. However, there is potential for advancement of liquids once pt is able to demonstrate use of swallowing precautions. SLP will follow for treatment. SLP Visit Diagnosis Dysphagia, oropharyngeal phase (R13.12) Attention and concentration deficit following -- Frontal lobe and executive function deficit following -- Impact on  safety and function Mild aspiration risk;Moderate aspiration risk   CHL IP TREATMENT RECOMMENDATION 10/22/2020 Treatment Recommendations Therapy as outlined in treatment plan below;F/U MBS in --- days (Comment)   Prognosis 10/22/2020 Prognosis for Safe Diet Advancement Good Barriers to Reach Goals Cognitive deficits Barriers/Prognosis Comment -- CHL IP DIET RECOMMENDATION 10/22/2020 SLP Diet Recommendations Dysphagia 2 (Fine chop) solids;Honey thick liquids Liquid Administration via Cup;Straw Medication Administration Whole meds with puree Compensations Slow rate;Small sips/bites;Minimize  environmental distractions;Follow solids with liquid Postural Changes Remain semi-upright after after feeds/meals (Comment);Seated upright at 90 degrees   CHL IP OTHER RECOMMENDATIONS 10/22/2020 Recommended Consults -- Oral Care Recommendations Oral care BID Other Recommendations Order thickener from pharmacy   CHL IP FOLLOW UP RECOMMENDATIONS 10/22/2020 Follow up Recommendations Skilled Nursing facility   [facility] IP FREQUENCY AND DURATION 10/22/2020 Speech Therapy Frequency (ACUTE ONLY) min 2x/week Treatment Duration 2 weeks      CHL IP ORAL PHASE 10/22/2020 Oral Phase Impaired Oral - Pudding Teaspoon -- Oral - Pudding Cup -- Oral - Honey Teaspoon -- Oral - Honey Cup -- Oral - Nectar Teaspoon -- Oral - Nectar Cup Decreased bolus cohesion;Premature spillage Oral - Nectar Straw Decreased bolus cohesion;Premature spillage Oral - Thin Teaspoon -- Oral - Thin Cup Decreased bolus cohesion;Premature spillage Oral - Thin Straw Decreased bolus cohesion;Premature spillage Oral - Puree WFL Oral - Mech Soft -- Oral - Regular Impaired mastication Oral - Multi-Consistency -- Oral - Pill WFL Oral Phase - Comment --  CHL IP PHARYNGEAL PHASE 10/22/2020 Pharyngeal Phase Impaired Pharyngeal- Pudding Teaspoon -- Pharyngeal -- Pharyngeal- Pudding Cup -- Pharyngeal -- Pharyngeal- Honey Teaspoon -- Pharyngeal -- Pharyngeal- Honey Cup -- Pharyngeal -- Pharyngeal- Nectar Teaspoon -- Pharyngeal -- Pharyngeal- Nectar Cup Trace aspiration;Pharyngeal residue - valleculae;Pharyngeal residue - pyriform;Reduced tongue base retraction;Penetration/Aspiration during swallow;Penetration/Apiration after swallow;Reduced anterior laryngeal mobility;Delayed swallow initiation-vallecula;Delayed swallow initiation-pyriform sinuses Pharyngeal Material enters airway, remains ABOVE vocal cords and not ejected out;Material enters airway, passes BELOW cords and not ejected out despite cough attempt by patient Pharyngeal- Nectar Straw Trace aspiration;Pharyngeal residue  - valleculae;Pharyngeal residue - pyriform;Reduced tongue base retraction;Penetration/Aspiration during swallow;Penetration/Apiration after swallow;Reduced anterior laryngeal mobility;Delayed swallow initiation-vallecula;Delayed swallow initiation-pyriform sinuses Pharyngeal Material enters airway, CONTACTS cords and not ejected out;Material enters airway, passes BELOW cords and not ejected out despite cough attempt by patient Pharyngeal- Thin Teaspoon -- Pharyngeal -- Pharyngeal- Thin Cup Trace aspiration;Pharyngeal residue - valleculae;Pharyngeal residue - pyriform;Reduced tongue base retraction;Penetration/Aspiration during swallow;Penetration/Apiration after swallow;Reduced anterior laryngeal mobility;Delayed swallow initiation-vallecula;Delayed swallow initiation-pyriform sinuses Pharyngeal Material enters airway, passes BELOW cords and not ejected out despite cough attempt by patient;Material enters airway, remains ABOVE vocal cords and not ejected out Pharyngeal- Thin Straw Trace aspiration;Pharyngeal residue - valleculae;Pharyngeal residue - pyriform;Reduced tongue base retraction;Penetration/Aspiration during swallow;Penetration/Apiration after swallow;Reduced anterior laryngeal mobility;Delayed swallow initiation-vallecula;Delayed swallow initiation-pyriform sinuses Pharyngeal Material enters airway, CONTACTS cords and not ejected out;Material enters airway, passes BELOW cords and not ejected out despite cough attempt by patient Pharyngeal- Puree Pharyngeal residue - valleculae;Pharyngeal residue - pyriform;Reduced tongue base retraction;Reduced anterior laryngeal mobility;Delayed swallow initiation-vallecula;Delayed swallow initiation-pyriform sinuses Pharyngeal -- Pharyngeal- Mechanical Soft -- Pharyngeal -- Pharyngeal- Regular -- Pharyngeal -- Pharyngeal- Multi-consistency -- Pharyngeal -- Pharyngeal- Pill Pharyngeal residue - valleculae;Pharyngeal residue - pyriform;Reduced tongue base  retraction;Reduced anterior laryngeal mobility;Delayed swallow initiation-vallecula;Delayed swallow initiation-pyriform sinuses Pharyngeal -- Pharyngeal Comment --  CHL IP CERVICAL ESOPHAGEAL PHASE 10/22/2020 Cervical Esophageal Phase WFL Pudding Teaspoon -- Pudding Cup -- Honey Teaspoon -- Honey Cup -- Nectar Teaspoon -- Nectar Cup -- Nectar Straw -- Thin Teaspoon --  Thin Cup -- Thin Straw -- Puree -- Mechanical Soft -- Regular -- Multi-consistency -- Pill -- Cervical Esophageal Comment -- Shanika I. Hardin Negus, [place], [place] Office number [phone] Pager [place] 10/22/2020, 4:50 PM              DG C-Arm 1-60 Min  Result Date: 10/12/2020 CLINICAL DATA:  Anterior left hip. EXAM: DG C-ARM 1-60 MIN; OPERATIVE LEFT HIP WITH PELVIS FLUOROSCOPY TIME:  Fluoroscopy Time:  6 seconds Number of Acquired Spot Images: 6. COMPARISON:  March 31, 22 FINDINGS: Six C-arm fluoroscopic images were obtained intraoperatively and submitted for post operative interpretation. These images demonstrate surgical changes of left total hip arthroplasty for a left femoral neck fracture. Please see the performing provider's procedural report for further detail. IMPRESSION: Left total hip arthroplasty. Electronically Signed   By: David Sheffield MD   On: 10/12/2020 13:46   ECHOCARDIOGRAM COMPLETE  Result Date: 10/11/2020    ECHOCARDIOGRAM REPORT   Patient Name:   KAEDON FANELLI Date of Exam: 10/11/2020 Medical Rec #:  [id number]      Height:       69.0 in Accession #:    [id number]     Weight:       147.0 lb Date of Birth:  Jul 20, 1950     BSA:          1.813 m Patient Age:    43 years       BP:           85/49 mmHg Patient Gender: M              HR:           88 bpm. Exam Location:  Inpatient Procedure: 2D Echo Indications:    pulmonary embolus  History:        Patient has no prior history of Echocardiogram examinations.                 CAD; COPD and lung cancer.  Sonographer:    Johny Chess Referring Phys: [id number] [id number] [id number]  Sonographer Comments: Echo performed with patient supine and on artificial respirator. Image acquisition challenging due to respiratory motion. IMPRESSIONS  1. Left ventricular ejection fraction, by estimation, is 50 to 55%. The left ventricle has low normal function. The left ventricle has no regional wall motion abnormalities. Left ventricular diastolic parameters are indeterminate.  2. Right ventricular systolic function is normal. The right ventricular size is normal.  3. The mitral valve is grossly normal. Trivial mitral valve regurgitation.  4. The aortic valve has an indeterminant number of cusps. There is moderate calcification of the aortic valve. There is mild thickening of the aortic valve. Aortic valve regurgitation is trivial. Mild to moderate aortic valve sclerosis/calcification is present, without any evidence of aortic stenosis.  5. The inferior vena cava is dilated in size with >50% respiratory variability, suggesting right atrial pressure of 8 mmHg. Comparison(s): No prior Echocardiogram. Conclusion(s)/Recommendation(s): Otherwise normal echocardiogram, with minor abnormalities described in the report. Frequent PVCs throughout study. FINDINGS  Left Ventricle: Left ventricular ejection fraction, by estimation, is 50 to 55%. The left ventricle has low normal function. The left ventricle has no regional wall motion abnormalities. The left ventricular internal cavity size was normal in size. There is no left ventricular hypertrophy. Left ventricular diastolic parameters are indeterminate. Right Ventricle: The right ventricular size is normal. No increase in right ventricular wall thickness. Right ventricular systolic function is normal. Left Atrium:  Left atrial size was normal in size. Right Atrium: Right atrial size was normal in size. Pericardium: There is no evidence of pericardial effusion. Presence of pericardial fat pad. Mitral Valve: The mitral valve  is grossly normal. Trivial mitral valve regurgitation. Tricuspid Valve: The tricuspid valve is normal in structure. Tricuspid valve regurgitation is trivial. Aortic Valve: The aortic valve has an indeterminant number of cusps. There is moderate calcification of the aortic valve. There is mild thickening of the aortic valve. Aortic valve regurgitation is trivial. Mild to moderate aortic valve sclerosis/calcification is present, without any evidence of aortic stenosis. Pulmonic Valve: The pulmonic valve was not well visualized. Pulmonic valve regurgitation is not visualized. Aorta: The aortic root is normal in size and structure and the ascending aorta was not well visualized. Venous: The inferior vena cava is dilated in size with greater than 50% respiratory variability, suggesting right atrial pressure of 8 mmHg. IAS/Shunts: The atrial septum is grossly normal.  LEFT VENTRICLE PLAX 2D LVIDd:         5.00 cm  Diastology LVIDs:         3.90 cm  LV e' medial:  4.46 cm/s LV PW:         1.00 cm  LV e' lateral: 7.94 cm/s LV IVS:        1.00 cm LVOT diam:     2.40 cm LV SV:         61 LV SV Index:   33 LVOT Area:     4.52 cm  RIGHT VENTRICLE             IVC RV S prime:     10.80 cm/s  IVC diam: 2.60 cm TAPSE (M-mode): 1.7 cm LEFT ATRIUM           Index       RIGHT ATRIUM           Index LA diam:      2.90 cm 1.60 cm/m  RA Area:     12.80 cm LA Vol (A4C): 21.9 ml 12.08 ml/m RA Volume:   30.80 ml  16.99 ml/m  AORTIC VALVE LVOT Vmax:   77.70 cm/s LVOT Vmean:  55.800 cm/s LVOT VTI:    0.134 m  AORTA Ao Root diam: 3.20 cm  SHUNTS Systemic VTI:  0.13 m Systemic Diam: 2.40 cm Buford Dresser MD Electronically signed by Buford Dresser MD Signature Date/Time: 10/11/2020/9:14:16 PM    Final    DG HIP PORT UNILAT WITH PELVIS 1V LEFT  Result Date: 10/30/2020 CLINICAL DATA:  Fall.  Left hip pain. EXAM: DG HIP (WITH OR WITHOUT PELVIS) 1V PORT LEFT COMPARISON:  10/12/2020. FINDINGS: Left hip replacement. Hardware  intact. Anatomic alignment. Diffuse osteopenia. Degenerative changes lumbar spine and right hip. No acute bony or joint abnormality. Barium noted throughout the colon. Pelvic calcifications consistent with phleboliths. IMPRESSION: 1.  Left hip replacement.  Hardware intact.  Anatomic alignment. 2. Diffuse osteopenia. Degenerative changes lumbar spine and right hip. No acute bony abnormality. Electronically Signed   By: Marcello Moores  Register   On: 10/30/2020 11:19   DG HIP OPERATIVE UNILAT W OR W/O PELVIS LEFT  Result Date: 10/12/2020 CLINICAL DATA:  Anterior left hip. EXAM: DG C-ARM 1-60 MIN; OPERATIVE LEFT HIP WITH PELVIS FLUOROSCOPY TIME:  Fluoroscopy Time:  6 seconds Number of Acquired Spot Images: 6. COMPARISON:  March 31, 22 FINDINGS: Six C-arm fluoroscopic images were obtained intraoperatively and submitted for post operative interpretation. These images demonstrate surgical changes of left  total hip arthroplasty for a left femoral neck fracture. Please see the performing provider's procedural report for further detail. IMPRESSION: Left total hip arthroplasty. Electronically Signed   By: David Sheffield MD   On: 10/12/2020 13:46   DG HIP UNILAT WITH PELVIS 2-3 VIEWS LEFT  Result Date: 10/11/2020 CLINICAL DATA:  Left hip fracture. EXAM: DG HIP (WITH OR WITHOUT PELVIS) 2-3V LEFT COMPARISON:  Abdominal CT reformats yesterday. FINDINGS: Displaced left femoral neck fracture. Fracture is mildly comminuted. There is mild apex lateral angulation and proximal migration of the femoral shaft. Pubic rami are intact. Pubic symphysis and sacroiliac joints are congruent. IMPRESSION: Displaced left femoral neck fracture. Electronically Signed   By: Keith Rake M.D.   On: 10/11/2020 17:56       The results of significant diagnostics from this hospitalization (including imaging, microbiology, ancillary and laboratory) are listed below for reference.     Microbiology: Recent Results (from the past 240 hour(s))   SARS CORONAVIRUS 2 (TAT 6-24 HRS) Nasopharyngeal Nasopharyngeal Swab     Status: None   Collection Time: 11/08/20  4:26 AM   Specimen: Nasopharyngeal Swab  Result Value Ref Range Status   SARS Coronavirus 2 NEGATIVE NEGATIVE Final    Comment: (NOTE) SARS-CoV-2 target nucleic acids are NOT DETECTED.  The SARS-CoV-2 RNA is generally detectable in upper and lower respiratory specimens during the acute phase of infection. Negative results do not preclude SARS-CoV-2 infection, do not rule out co-infections with other pathogens, and should not be used as the sole basis for treatment or other patient management decisions. Negative results must be combined with clinical observations, patient history, and epidemiological information. The expected result is Negative.  Fact Sheet for Patients: SugarRoll.be  Fact Sheet for Healthcare Providers: https://www.woods-mathews.com/  This test is not yet approved or cleared by the [place] FDA and  has been authorized for detection and/or diagnosis of SARS-CoV-2 by FDA under an Emergency Use Authorization (EUA). This EUA will remain  in effect (meaning this test can be used) for the duration of the COVID-19 declaration under Se ction 564(b)(1) of the Act, 21 U.S.C. section 360bbb-3(b)(1), unless the authorization is terminated or revoked sooner.  Performed at [facility], [place] [address] [place], [place] [address]      Labs:  CBC: Recent Labs  Lab 11/04/20 0304 11/05/20 0439 11/06/20 0454 11/07/20 0418  WBC 8.2 8.9 8.3 11.2*  NEUTROABS 7.3 7.8* 7.2 9.7*  HGB 10.3* 9.8* 9.4* 9.5*  HCT 31.6* 30.1* 29.7* 29.8*  MCV 98.1 98.0 99.7 99.0  PLT 272 225 208 188   BMP &GFR Recent Labs  Lab 11/04/20 0304 11/05/20 0439 11/06/20 0454 11/07/20 0418 11/08/20 0323 11/09/20 0415 11/10/20 0431  NA 136 135 136 139  --   --   --   K 4.1 4.3 4.2 4.1  --   --   --   CL 106 104 104 108  --    --   --   CO2 24 25 26 24   --   --   --   GLUCOSE 129* 134* 125* 145*  --   --   --   BUN 30* 31* 31* 42*  --   --   --   CREATININE 0.70 0.75 0.75 0.81  --   --   --   CALCIUM 8.8* 8.8* 8.5* 8.7*  --   --   --   MG 2.1 2.0 2.1 2.0 1.9 2.0 2.2  PHOS 3.0 3.0 3.5  2.7 2.5 3.6 3.1   Estimated Creatinine Clearance: 76.6 mL/min (by C-G formula based on SCr of 0.81 mg/dL). Liver & Pancreas: Recent Labs  Lab 11/04/20 0304 11/05/20 0439 11/06/20 0454 11/07/20 0418  AST 12* 14* 12* 18  ALT 18 16 17 25   ALKPHOS 78 72 61 74  BILITOT 0.5 0.5 0.4 0.3  PROT 6.1* 5.8* 5.4* 4.9*  ALBUMIN 3.3* 3.0* 3.0* 2.7*   No results for input(s): LIPASE, AMYLASE in the last 168 hours. No results for input(s): AMMONIA in the last 168 hours. Diabetic: Recent Labs    11/09/20 0415  HGBA1C 5.1   Recent Labs  Lab 11/09/20 1703 11/09/20 2007 11/10/20 0002 11/10/20 0458 11/10/20 0828  GLUCAP 148* 138* 150* 155* 102*   Cardiac Enzymes: No results for input(s): CKTOTAL, CKMB, CKMBINDEX, TROPONINI in the last 168 hours. No results for input(s): PROBNP in the last 8760 hours. Coagulation Profile: No results for input(s): INR, PROTIME in the last 168 hours. Thyroid Function Tests: No results for input(s): TSH, T4TOTAL, FREET4, T3FREE, THYROIDAB in the last 72 hours. Lipid Profile: No results for input(s): CHOL, HDL, LDLCALC, TRIG, CHOLHDL, LDLDIRECT in the last 72 hours. Anemia Panel: No results for input(s): VITAMINB12, FOLATE, FERRITIN, TIBC, IRON, RETICCTPCT in the last 72 hours. Urine analysis:    Component Value Date/Time   COLORURINE AMBER (A) 10/10/2020 1610   APPEARANCEUR HAZY (A) 10/10/2020 1610   LABSPEC 1.029 10/10/2020 1610   PHURINE 5.0 10/10/2020 1610   GLUCOSEU NEGATIVE 10/10/2020 1610   HGBUR LARGE (A) 10/10/2020 1610   BILIRUBINUR NEGATIVE 10/10/2020 1610   KETONESUR 20 (A) 10/10/2020 1610   PROTEINUR 100 (A) 10/10/2020 1610   NITRITE NEGATIVE 10/10/2020 1610   LEUKOCYTESUR  NEGATIVE 10/10/2020 1610   Sepsis Labs: Invalid input(s): PROCALCITONIN, LACTICIDVEN   Time coordinating discharge: 40 minutes  SIGNED:  Mercy Riding, MD  [facility] 11/10/2020, 9:40 AM  If 7PM-7AM, please contact night-coverage [email or web address]

## 2020-11-12 DIAGNOSIS — J449 Chronic obstructive pulmonary disease, unspecified: Secondary | ICD-10-CM | POA: Diagnosis not present

## 2020-11-12 DIAGNOSIS — C3490 Malignant neoplasm of unspecified part of unspecified bronchus or lung: Secondary | ICD-10-CM | POA: Diagnosis not present

## 2020-11-12 DIAGNOSIS — E785 Hyperlipidemia, unspecified: Secondary | ICD-10-CM | POA: Diagnosis not present

## 2020-11-12 DIAGNOSIS — S0292XD Unspecified fracture of facial bones, subsequent encounter for fracture with routine healing: Secondary | ICD-10-CM | POA: Diagnosis not present

## 2020-11-12 DIAGNOSIS — I2510 Atherosclerotic heart disease of native coronary artery without angina pectoris: Secondary | ICD-10-CM | POA: Diagnosis not present

## 2020-11-12 DIAGNOSIS — W19XXXD Unspecified fall, subsequent encounter: Secondary | ICD-10-CM | POA: Diagnosis not present

## 2020-11-12 DIAGNOSIS — S32009D Unspecified fracture of unspecified lumbar vertebra, subsequent encounter for fracture with routine healing: Secondary | ICD-10-CM | POA: Diagnosis not present

## 2020-11-12 DIAGNOSIS — F419 Anxiety disorder, unspecified: Secondary | ICD-10-CM | POA: Diagnosis not present

## 2020-11-19 ENCOUNTER — Telehealth (HOSPITAL_COMMUNITY): Payer: Self-pay | Admitting: Surgery

## 2020-11-19 NOTE — Telephone Encounter (Signed)
The pt's sister had left a voicemail asking if our office could call her and discuss her brother's treatment plan and prognosis.  Based on the note in the pt's chart, he does not want to release medical information to family members.  I attempted to call the pt's sister back to let her know that I could not share any information, but I was not able to leave a voicemail.

## 2020-11-22 ENCOUNTER — Other Ambulatory Visit: Payer: Self-pay

## 2020-11-22 ENCOUNTER — Inpatient Hospital Stay (HOSPITAL_COMMUNITY): Payer: Medicare HMO

## 2020-11-22 ENCOUNTER — Inpatient Hospital Stay (HOSPITAL_COMMUNITY): Payer: Medicare HMO | Attending: Hematology | Admitting: Hematology

## 2020-11-22 VITALS — BP 136/55 | HR 86 | Temp 97.0°F | Resp 18

## 2020-11-22 DIAGNOSIS — C3490 Malignant neoplasm of unspecified part of unspecified bronchus or lung: Secondary | ICD-10-CM | POA: Diagnosis not present

## 2020-11-22 DIAGNOSIS — C770 Secondary and unspecified malignant neoplasm of lymph nodes of head, face and neck: Secondary | ICD-10-CM | POA: Diagnosis not present

## 2020-11-22 DIAGNOSIS — C7931 Secondary malignant neoplasm of brain: Secondary | ICD-10-CM | POA: Diagnosis not present

## 2020-11-22 DIAGNOSIS — C787 Secondary malignant neoplasm of liver and intrahepatic bile duct: Secondary | ICD-10-CM | POA: Diagnosis not present

## 2020-11-22 DIAGNOSIS — F17210 Nicotine dependence, cigarettes, uncomplicated: Secondary | ICD-10-CM | POA: Insufficient documentation

## 2020-11-22 LAB — CBC WITH DIFFERENTIAL/PLATELET
Abs Immature Granulocytes: 0.22 10*3/uL — ABNORMAL HIGH (ref 0.00–0.07)
Basophils Absolute: 0 10*3/uL (ref 0.0–0.1)
Basophils Relative: 0 %
Eosinophils Absolute: 0.1 10*3/uL (ref 0.0–0.5)
Eosinophils Relative: 1 %
HCT: 38.1 % — ABNORMAL LOW (ref 39.0–52.0)
Hemoglobin: 12 g/dL — ABNORMAL LOW (ref 13.0–17.0)
Immature Granulocytes: 2 %
Lymphocytes Relative: 13 %
Lymphs Abs: 1.2 10*3/uL (ref 0.7–4.0)
MCH: 32.5 pg (ref 26.0–34.0)
MCHC: 31.5 g/dL (ref 30.0–36.0)
MCV: 103.3 fL — ABNORMAL HIGH (ref 80.0–100.0)
Monocytes Absolute: 0.4 10*3/uL (ref 0.1–1.0)
Monocytes Relative: 5 %
Neutro Abs: 7.4 10*3/uL (ref 1.7–7.7)
Neutrophils Relative %: 79 %
Platelets: 182 10*3/uL (ref 150–400)
RBC: 3.69 MIL/uL — ABNORMAL LOW (ref 4.22–5.81)
RDW: 20.6 % — ABNORMAL HIGH (ref 11.5–15.5)
WBC: 9.3 10*3/uL (ref 4.0–10.5)
nRBC: 0.2 % (ref 0.0–0.2)

## 2020-11-22 LAB — COMPREHENSIVE METABOLIC PANEL
ALT: 32 U/L (ref 0–44)
AST: 20 U/L (ref 15–41)
Albumin: 3.5 g/dL (ref 3.5–5.0)
Alkaline Phosphatase: 84 U/L (ref 38–126)
Anion gap: 6 (ref 5–15)
BUN: 32 mg/dL — ABNORMAL HIGH (ref 8–23)
CO2: 30 mmol/L (ref 22–32)
Calcium: 9.4 mg/dL (ref 8.9–10.3)
Chloride: 104 mmol/L (ref 98–111)
Creatinine, Ser: 0.81 mg/dL (ref 0.61–1.24)
GFR, Estimated: >60 mL/min (ref >60)
Glucose, Bld: 106 mg/dL — ABNORMAL HIGH (ref 70–99)
Potassium: 4.1 mmol/L (ref 3.5–5.1)
Sodium: 140 mmol/L (ref 135–145)
Total Bilirubin: 0.7 mg/dL (ref 0.3–1.2)
Total Protein: 6.6 g/dL (ref 6.5–8.1)

## 2020-11-22 LAB — MAGNESIUM: Magnesium: 1.8 mg/dL (ref 1.7–2.4)

## 2020-11-22 MED ORDER — TAMSULOSIN HCL 0.4 MG PO CAPS: 0.4000 mg | ORAL_CAPSULE | Freq: Every day | ORAL | 3 refills | Status: DC

## 2020-11-22 NOTE — Progress Notes (Signed)
[place], [place] [address]   CLINIC:  Medical Oncology/Hematology  PCP:  [name], MD 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 Bulky 5.4 cm right supraclavicular region malignant lymph node conglomeration with extracapsular extension. 2. Surrounding smaller abnormal right level 3 and level 5 lymph nodes, and the lymphadenopathy continues into the superior mediastinum, see Chest CT findings reported separately. 3. No other metastatic disease identified in the neck.   01/16/2017 Imaging   CT chest: IMPRESSION: 1. Extensive lymphadenopathy in the thorax and lower right cervical region, as discussed above. Primary differential considerations include lymphoma/leukemia or small cell carcinoma of the lung. Further evaluation a PET-CT could be considered to assess for additional sites of disease below the diaphragm if clinically appropriate. Additionally, ultrasound-guided biopsy of supraclavicular lymphadenopathy could be considered to establish a tissue diagnosis. 2. Indeterminate lesion in the periphery of segment 8 of the liver measuring 2.7 x 1.7 cm. Attention at time of follow-up PET-CT is recommended. 3. Aortic atherosclerosis, in addition to left main and 3 vessel coronary artery disease. Please note that although the presence of coronary artery calcium documents the presence of coronary artery disease, the severity of this disease and any potential stenosis cannot be assessed on this non-gated CT examination. Assessment for potential risk factor modification,  dietary therapy or pharmacologic therapy may be warranted, if clinically indicated. 4. There are calcifications of the aortic valve. Echocardiographic correlation for evaluation of potential valvular dysfunction may be warranted if clinically indicated. 5. Diffuse bronchial wall thickening with moderate centrilobular and paraseptal emphysema; imaging findings suggestive of underlying COPD.   02/03/2017 Initial Biopsy   (R) neck lymph node biopsy: SMALL CELL CARCINOMA (most likely lung primary).    02/03/2017 Miscellaneous   Port-a-cath attempted by IR; unable to place d/t enlarged SVC.    02/05/2017 Initial Diagnosis   Extensive stage primary small cell carcinoma of lung ([place])   02/09/2017 - 05/27/2017 Chemotherapy   6 cycles of cisplatin+etoposide    02/11/2017 Imaging   MRI brain: CLINICAL DATA:  Advanced stage small cell lung cancer. Staging for metastatic disease  EXAM: MRI HEAD WITHOUT AND WITH CONTRAST  TECHNIQUE: Multiplanar, multiecho pulse sequences of the brain and surrounding structures were obtained without and with intravenous contrast.  CONTRAST:  72m MULTIHANCE GADOBENATE DIMEGLUMINE 529 MG/ML IV SOLN  COMPARISON:  None.  FINDINGS: Brain: Negative for hydrocephalus. Cerebral volume normal for age. Small nonenhancing white matter hyperintensities consistent with mild chronic microvascular ischemia. No acute infarct. Negative for hemorrhage or mass or edema  Normal enhancement postcontrast infusion. No enhancing mass lesion. Leptomeningeal enhancement is normal.  Vascular: Normal arterial flow voids.  Normal venous enhancement  Skull and upper cervical spine: Negative  Sinuses/Orbits: Negative  Other: None  IMPRESSION: Negative for metastatic disease.  No acute abnormality.  Mild chronic white matter changes.   04/07/2017 Imaging    PET:  1. Marked reduction in size and metabolic activity of bulky RIGHT supraclavicular adenopathy  mediastinal lymphadenopathy. 2. Residual moderate activity remains within small RIGHT supraclavicular lymph node, RIGHT lower paratracheal lymph  node and RIGHT hilar lymph node. 3. Resolution of prevascular and internal mammary mediastinal metastatic hypermetabolic activity. 4. Resolution of metabolic activity associated with solitary RIGHT hepatic lobe liver metastasis. 5. No evidence of disease progression. 6. 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   05/27/2017 Imaging   MRI brain w/ and w/o contrast IMPRESSION: 1. No metastatic disease identified. 2. Increased nonspecific cerebral white matter signal changes since August. These are most commonly small vessel disease related. 3. New right maxillary sinusitis. Benign appearing retention cysts in the nasopharynx with trace mastoid effusions.   06/12/2017 Imaging   PET-CT IMPRESSION: 1. There are two new hypermetabolic nodules identified within both lower lobes measuring up to 3.1 cm. The appearance is nonspecific and may be inflammatory/infectious in etiology. Pulmonary metastatic disease cannot be excluded and short-term follow-up imaging in 3 months is advised to reassess these nodules. 2. Stable appearance of mild hypermetabolic activity associated with right paratracheal and right hilar lymph nodes. 3. Decrease in FDG uptake associated with index right supraclavicular lymph node. 4. 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 5. Aortic Atherosclerosis (ICD10-I70.0) and Emphysema (ICD10-J43.9).   08/24/2017 - 11/18/2019 Chemotherapy   The patient had pegfilgrastim (NEULASTA ONPRO KIT) injection 6 mg, 6 mg, Subcutaneous, Once, 30 of 31 cycles Administration: 6 mg (08/28/2017), 6 mg (09/18/2017), 6 mg (10/09/2017), 6 mg (11/02/2017), 6 mg  (11/20/2017), 6 mg (12/18/2017), 6 mg (01/15/2018), 6 mg (02/12/2018), 6 mg (03/12/2018), 6 mg (04/09/2018), 6 mg (05/07/2018), 6 mg (06/04/2018), 6 mg (07/02/2018), 6 mg (07/30/2018), 6 mg (08/27/2018), 6 mg (09/24/2018), 6 mg (10/29/2018), 6 mg (11/26/2018), 6 mg (12/24/2018), 6 mg (01/21/2019), 6 mg (02/18/2019), 6 mg (03/18/2019), 6 mg (04/15/2019), 6 mg (05/13/2019), 6 mg (06/17/2019), 6 mg (07/22/2019), 6 mg (08/19/2019), 6 mg (09/16/2019), 6 mg (10/21/2019), 6 mg (11/18/2019) topotecan (HYCAMTIN) 2.9 mg in sodium chloride 0.9 % 100 mL chemo infusion, 1.5 mg/m2 = 2.9 mg, Intravenous,  Once, 30 of 31 cycles Dose modification: 1.2 mg/m2 (80 % of original dose 1.5 mg/m2, Cycle 4, Reason: Dose Not Tolerated) Administration: 2.9 mg (08/24/2017), 2.9 mg (08/25/2017), 2.9 mg (08/28/2017), 2.9 mg (08/26/2017), 2.9 mg (08/27/2017), 2.9 mg (09/14/2017), 2.9 mg (09/15/2017), 2.9 mg (09/16/2017), 2.9 mg (09/17/2017), 2.9 mg (09/18/2017), 2.9 mg (10/05/2017), 2.9 mg (10/06/2017), 2.9 mg (10/07/2017), 2.9 mg (10/08/2017), 2.9 mg (10/09/2017), 2.3 mg (10/26/2017), 2.3 mg (10/27/2017), 2.3 mg (10/28/2017), 2.3 mg (10/29/2017), 2.3 mg (11/02/2017), 2.3 mg (11/16/2017), 2.3 mg (11/17/2017), 2.3 mg (11/18/2017), 2.3 mg (11/19/2017), 2.3 mg (11/20/2017), 2.3 mg (12/14/2017), 2.3 mg (12/15/2017), 2.3 mg (12/16/2017), 2.3 mg (12/17/2017), 2.3 mg (12/18/2017), 2.3 mg (01/11/2018), 2.3 mg (01/12/2018), 2.3 mg (01/13/2018), 2.3 mg (01/15/2018), 2.3 mg (02/08/2018), 2.3 mg (02/09/2018), 2.3 mg (02/10/2018), 2.3 mg (02/11/2018), 2.3 mg (02/12/2018), 2.3 mg (03/08/2018), 2.3 mg (03/09/2018), 2.3 mg (03/10/2018), 2.3 mg (03/11/2018), 2.3 mg (03/12/2018), 2.2 mg (04/05/2018), 2.2 mg (04/06/2018), 2.2 mg (04/07/2018), 2.2 mg (04/08/2018), 2.2 mg (04/09/2018), 2.2 mg (05/03/2018), 2.2 mg (05/04/2018), 2.2 mg (05/05/2018), 2.2 mg (05/06/2018), 2.2 mg (05/07/2018), 2.2 mg (05/31/2018), 2.2 mg (06/01/2018), 2.2 mg (06/02/2018), 2.2 mg (06/03/2018), 2.2 mg (06/04/2018), 2.2 mg (06/28/2018), 2.2 mg (06/29/2018), 2.2 mg (06/30/2018), 2.2 mg  (07/01/2018), 2.2 mg (07/02/2018), 2.2 mg (07/26/2018), 2.2 mg (07/27/2018), 2.2 mg (07/28/2018), 2.2 mg (07/29/2018), 2.2 mg (07/30/2018), 2.2 mg (08/23/2018), 2.2 mg (08/24/2018), 2.2 mg (08/25/2018), 2.2 mg (08/26/2018), 2.2  mg (08/27/2018), 2.2 mg (09/20/2018), 2.2 mg (09/21/2018), 2.2 mg (09/22/2018), 2.2 mg (09/23/2018), 2.2 mg (09/24/2018), 2.2 mg (10/25/2018), 2.2 mg (10/26/2018), 2.2 mg (10/27/2018), 2.2 mg (10/28/2018), 2.2 mg (10/29/2018), 2.2 mg (11/22/2018), 2.2 mg (11/23/2018), 2.2 mg (11/24/2018), 2.2 mg (11/25/2018), 2.2 mg (11/26/2018), 2.2 mg (12/20/2018), 2.2 mg (12/21/2018), 2.2 mg (12/22/2018), 2.2 mg (12/23/2018), 2.2 mg (12/24/2018), 2.2 mg (01/17/2019), 2.2 mg (01/18/2019), 2.2 mg (01/19/2019), 2.2 mg (01/20/2019), 2.2 mg (01/21/2019), 2.2 mg (02/14/2019), 2.2 mg (02/15/2019), 2.2 mg (02/16/2019), 2.2 mg (02/17/2019), 2.2 mg (02/18/2019), 2.2 mg (03/14/2019), 2.2 mg (03/15/2019), 2.2 mg (03/16/2019), 2.2 mg (03/17/2019), 2.2 mg (03/18/2019), 2.2 mg (04/11/2019), 2.2 mg (04/12/2019), 2.2 mg (04/13/2019), 2.2 mg (04/14/2019), 2.2 mg (04/15/2019), 2.2 mg (05/09/2019), 2.2 mg (05/10/2019), 2.2 mg (05/11/2019), 2.2 mg (05/12/2019), 2.2 mg (05/13/2019), 2.2 mg (06/13/2019), 2.2 mg (06/14/2019), 2.2 mg (06/15/2019), 2.2 mg (06/16/2019), 2.2 mg (06/17/2019), 2.2 mg (07/18/2019), 2.2 mg (07/19/2019), 2.2 mg (07/20/2019), 2.2 mg (07/21/2019), 2.2 mg (07/22/2019), 2.2 mg (08/15/2019), 2.2 mg (08/16/2019), 2.2 mg (08/17/2019), 2.2 mg (08/18/2019), 2.2 mg (08/19/2019), 2.2 mg (09/12/2019), 2.2 mg (09/13/2019), 2.2 mg (09/14/2019), 2.2 mg (09/15/2019), 2.2 mg (09/16/2019), 2.2 mg (10/17/2019), 2.2 mg (10/18/2019), 2.2 mg (10/19/2019), 2.2 mg (10/20/2019), 2.2 mg (10/21/2019), 2.2 mg (11/14/2019), 2.2 mg (11/15/2019), 2.2 mg (11/16/2019), 2.2 mg (11/17/2019), 2.2 mg (11/18/2019) ondansetron (ZOFRAN) 4 mg in sodium chloride 0.9 % 50 mL IVPB, , Intravenous,  Once, 25 of 26 cycles Administration:  (02/08/2018),  (02/09/2018),  (02/10/2018),  (02/11/2018),  (02/12/2018),  (03/08/2018),  (03/09/2018),  (03/10/2018),  (03/11/2018),   (03/12/2018),  (04/05/2018),  (04/06/2018),  (04/07/2018),  (04/08/2018),  (04/09/2018),  (05/03/2018),  (05/04/2018),  (05/05/2018),  (05/06/2018),  (05/07/2018),  (05/31/2018),  (06/01/2018),  (06/02/2018),  (06/03/2018),  (06/04/2018),  (06/28/2018),  (06/29/2018),  (06/30/2018),  (07/01/2018),  (07/02/2018),  (07/26/2018),  (07/27/2018),  (07/28/2018),  (07/29/2018),  (07/30/2018),  (08/23/2018),  (08/24/2018),  (08/25/2018),  (08/26/2018),  (08/27/2018),  (09/20/2018),  (09/21/2018),  (09/22/2018),  (09/23/2018),  (09/24/2018),  (10/25/2018),  (10/26/2018),  (10/27/2018),  (10/28/2018),  (10/29/2018),  (11/22/2018),  (11/23/2018),  (11/24/2018),  (11/25/2018),  (11/26/2018),  (12/20/2018),  (12/21/2018),  (12/22/2018),  (12/23/2018),  (12/24/2018),  (01/17/2019),  (01/18/2019),  (01/19/2019),  (01/20/2019),  (01/21/2019),  (02/14/2019),  (02/15/2019),  (02/16/2019),  (02/17/2019),  (02/18/2019),  (03/14/2019),  (03/15/2019),  (03/16/2019),  (03/17/2019),  (03/18/2019),  (04/11/2019),  (04/12/2019),  (04/13/2019),  (04/14/2019),  (04/15/2019),  (05/09/2019),  (05/10/2019),  (05/11/2019),  (05/12/2019),  (05/13/2019),  (06/13/2019),  (06/14/2019),  (06/15/2019),  (06/16/2019),  (06/17/2019),  (07/18/2019),  (07/19/2019),  (07/20/2019),  (07/21/2019),  (07/22/2019),  (08/15/2019),  (08/16/2019),  (08/17/2019),  (08/18/2019),  (08/19/2019),  (09/12/2019),  (09/13/2019),  (09/14/2019),  (09/15/2019),  (09/16/2019),  (10/17/2019),  (10/18/2019),  (10/19/2019),  (10/20/2019),  (10/21/2019),  (11/14/2019),  (11/15/2019),  (11/16/2019),  (11/17/2019),  (11/18/2019)  for chemotherapy treatment.    12/22/2019 -  Chemotherapy    Patient is on Treatment Plan: LUNG SMALL CELL LURBINECTEDIN Q21D        CANCER STAGING: Cancer Staging No matching staging information was found for the patient.  INTERVAL HISTORY:  Mr. [name], a 70 y.o. 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 [name] was last seen on 09/12/2020.   Today he reports feeling fair. Today he is accompanied by his brother. He  is currently living at the [facility] in [place] as of 10 days ago. He is exercising and eating by mouth; he denies difficulty swallowing but admits to difficulty  chewing. He is able to walk guided by a walker and a assistive belt held by caregiver. He reports falling down twice since last visit, but the falls were not severe and x-rays were not needed. He reports mild chronic back pain which he attributes to past back surgery. His appetite is excellent and he eats 5 meals a day. He is drinking 2 cans of boost/ensure daily. He reports incontinence and difficulty with urination. He is undergoing radiation for the brain cancer. He is taking xanax for sleep and anxiety and oxycodone for pain; he denies having any panic attacks. Oxycodone has caused constipation. He has had diarrhea which is soft, not watery. He complains of soreness in left leg.    REVIEW OF SYSTEMS:  Review of Systems  Constitutional: Positive for fatigue (50%). Negative for appetite change.  HENT:   Positive for trouble swallowing (trouble chewing).   Respiratory: Positive for cough.   Gastrointestinal: Positive for constipation and diarrhea.  Genitourinary: Positive for difficulty urinating (urgency).   Musculoskeletal: Positive for back pain (low back 6/10).  Neurological: Positive for dizziness and numbness (numb and burning in feet).    PAST MEDICAL/SURGICAL HISTORY:  Past Medical History:  Diagnosis Date  . Anxiety   . CAD (coronary artery disease)   . Cancer ([place])    stage 4 small cell lung cancer   . COPD (chronic obstructive pulmonary disease) ([place])   . Depression   . Dyspnea    increased exertion  . Feeling of chest tightness   . Heart palpitations   . History of chemotherapy   . Myocardial infarction ([place])   . Osteopenia   . Panic attacks   . Smoker    Past Surgical History:  Procedure Laterality Date  . BACK SURGERY  12/24/2000   L5,S1  . CORONARY STENT PLACEMENT  2005   RCA & CX  . HERNIA REPAIR  Right 1980's  . INGUINAL HERNIA REPAIR  12/1978   right side  . IR FLUORO GUIDE PORT INSERTION RIGHT  04/02/2017  . IR US GUIDE BX ASP/DRAIN  02/03/2017  . IR US GUIDE VASC ACCESS RIGHT  04/02/2017  . NM MYOCAR PERF WALL MOTION  09/07/2009   No ischemia; EF 51%  . SHOULDER SURGERY Left 08/2010  . SPINE SURGERY  2002   L5-S1  . TOTAL HIP ARTHROPLASTY Left 10/12/2020   Procedure: TOTALhemi  HIP ARTHROPLASTY ANTERIOR APPROACH;  Surgeon: [name], MD;  Location: [facility];  Service: Orthopedics;  Laterality: Left;    SOCIAL HISTORY:  Social History   Socioeconomic History  . Marital status: Single    Spouse name: Not on file  . Number of children: Not on file  . Years of education: Not on file  . Highest education level: Not on file  Occupational History  . Not on file  Tobacco Use  . Smoking status: Current Every Day Smoker    Packs/day: 1.00    Types: Cigarettes  . Smokeless tobacco: Never Used  Vaping Use  . Vaping Use: Never used  Substance and Sexual Activity  . Alcohol use: Yes    Comment: occas  . Drug use: No  . Sexual activity: Not on file  Other Topics Concern  . Not on file  Social History Narrative  . Not on file   Social Determinants of Health   Financial Resource Strain: Low Risk   . Difficulty of Paying Living Expenses: Not hard at all  Food Insecurity: No Food Insecurity  .  Worried About Charity fundraiser in the Last Year: Never true  . Ran Out of Food in the Last Year: Never true  Transportation Needs: No Transportation Needs  . Lack of Transportation (Medical): No  . Lack of Transportation (Non-Medical): No  Physical Activity: Inactive  . Days of Exercise per Week: 0 days  . Minutes of Exercise per Session: 0 min  Stress: No Stress Concern Present  . Feeling of Stress : Not at all  Social Connections: Socially Isolated  . Frequency of Communication with Friends and Family: More than three times a week  . Frequency of Social Gatherings with Friends  and Family: More than three times a week  . Attends Religious Services: Never  . Active Member of Clubs or Organizations: No  . Attends Archivist Meetings: Never  . Marital Status: Never married  Intimate Partner Violence: Not At Risk  . Fear of Current or Ex-Partner: No  . Emotionally Abused: No  . Physically Abused: No  . Sexually Abused: No    FAMILY HISTORY:  Family History  Problem Relation Age of Onset  . Heart attack Father   . Kidney disease Father        renal failure  . Heart failure Mother   . Heart attack Mother   . Cancer Brother   . Diabetes Brother   . Alcohol abuse Brother   . Diabetes Sister     CURRENT MEDICATIONS:  Current Outpatient Medications  Medication Sig Dispense Refill  . albuterol (VENTOLIN HFA) 108 (90 Base) MCG/ACT inhaler Inhale 2 puffs into the lungs every 6 (six) hours as needed for wheezing or shortness of breath.    . ALPRAZolam (XANAX) 1 MG tablet Take 1 tablet (1 mg total) by mouth 3 (three) times daily as needed for anxiety. 30 tablet 0  . Ascorbic Acid (VITAMIN C) 1000 MG tablet Take 1,000 mg by mouth daily.    [place] Kitchen aspirin EC 81 MG tablet Take 81 mg by mouth daily.    [place] Kitchen atorvastatin (LIPITOR) 40 MG tablet TAKE (1) TABLET BY MOUTH ONCE DAILY. (Patient taking differently: Take 40 mg by mouth daily.) 90 tablet 0  . calcium-vitamin D (OSCAL WITH D) 250-125 MG-UNIT tablet Take 1 tablet by mouth daily.    . cholecalciferol (VITAMIN D3) 25 MCG (1000 UT) tablet Take 1,000 Units by mouth daily.    [place] Kitchen dexamethasone (DECADRON) 4 MG tablet Take 1 tablet (4 mg total) by mouth 2 (two) times daily with a meal for 5 days, THEN 1 tablet (4 mg total) daily for 10 days. 20 tablet 0  . diphenoxylate-atropine (LOMOTIL) 2.5-0.025 MG/5ML liquid Take 5 mLs by mouth daily. 60 mL 0  . docusate sodium (COLACE) 100 MG capsule Take two capsules at bedtime starting the day you receive chemotherapy and then for four days after 30 capsule 3  . gabapentin  (NEURONTIN) 300 MG capsule TAKE (2) CAPSULES BY MOUTH THREE TIMES DAILY. (Patient taking differently: Take 600 mg by mouth 3 (three) times daily.) 180 capsule 6  . ipratropium-albuterol (DUONEB) 0.5-2.5 (3) MG/3ML SOLN Take 3 mLs by nebulization every 6 (six) hours as needed. 360 mL   . metoprolol tartrate (LOPRESSOR) 25 MG tablet Take 0.5 tablets (12.5 mg total) by mouth 2 (two) times daily. 180 tablet 0  . mirtazapine (REMERON) 15 MG tablet Take 2 tablets (30 mg total) by mouth at bedtime. 30 tablet 1  . Multiple Vitamins-Minerals (CENTRUM SILVER 50+MEN) TABS Take 1 tablet by mouth daily.    [place] Kitchen  nicotine (NICODERM CQ - DOSED IN MG/24 HOURS) 14 mg/24hr patch Place 1 patch (14 mg total) onto the skin daily. 28 patch 0  . Nutritional Supplements (,FEEDING SUPPLEMENT, PROSOURCE PLUS) liquid Take 30 mLs by mouth 2 (two) times daily between meals.    . ondansetron (ZOFRAN) 8 MG tablet Take 1 tablet (8 mg total) by mouth 2 (two) times daily as needed. Start on the third day after cisplatin chemotherapy. 30 tablet 1  . oxyCODONE-acetaminophen (PERCOCET/ROXICET) 5-325 MG tablet Take 1 tablet by mouth every 8 (eight) hours as needed for severe pain. 30 tablet 0  . prochlorperazine (COMPAZINE) 10 MG tablet Take 1 tablet (10 mg total) by mouth every 6 (six) hours as needed (Nausea or vomiting). 60 tablet 1  . senna (SENOKOT) 8.6 MG tablet Take 3 tablets by mouth daily.     . Tiotropium Bromide-Olodaterol (STIOLTO RESPIMAT) 2.5-2.5 MCG/ACT AERS Inhale 1 puff into the lungs daily.    . vitamin B-12 (CYANOCOBALAMIN) 100 MCG tablet Take 100 mcg by mouth daily.     No current facility-administered medications for this visit.    ALLERGIES:  Allergies  Allergen Reactions  . Codeine Nausea Only  . Niaspan [Niacin Er]     PHYSICAL EXAM:  Performance status (ECOG): 1 - Symptomatic but completely ambulatory  Vitals:   11/22/20 0900  BP: (!) 136/55  Pulse: 86  Resp: 18  Temp: (!) 97 F (36.1 C)  SpO2: 98%    Wt Readings from Last 3 Encounters:  11/09/20 138 lb 10.7 oz (62.9 kg)  09/12/20 147 lb 8 oz (66.9 kg)  08/23/20 152 lb 9.6 oz (69.2 kg)   Physical Exam Vitals reviewed.  Constitutional:      Appearance: Normal appearance.  Cardiovascular:     Rate and Rhythm: Normal rate and regular rhythm.     Pulses: Normal pulses.     Heart sounds: Normal heart sounds.  Pulmonary:     Effort: Pulmonary effort is normal.     Breath sounds: Normal breath sounds.  Chest:  Breasts:     Right: Supraclavicular adenopathy present.    Abdominal:     Palpations: Abdomen is soft. There is no hepatomegaly, splenomegaly or mass.     Tenderness: There is no abdominal tenderness.  Musculoskeletal:     Right lower leg: No edema.     Left lower leg: No edema.  Lymphadenopathy:     Cervical: Cervical adenopathy present.     Right cervical: Superficial cervical adenopathy present.     Upper Body:     Right upper body: Supraclavicular adenopathy present.  Neurological:     General: No focal deficit present.     Mental Status: He is alert and oriented to person, place, and time.  Psychiatric:        Mood and Affect: Mood normal.        Behavior: Behavior normal.      LABORATORY DATA:  I have reviewed the labs as listed.  CBC Latest Ref Rng & Units 11/22/2020 11/07/2020 11/06/2020  WBC 4.0 - 10.5 K/uL 9.3 11.2(H) 8.3  Hemoglobin 13.0 - 17.0 g/dL 12.0(L) 9.5(L) 9.4(L)  Hematocrit 39.0 - 52.0 % 38.1(L) 29.8(L) 29.7(L)  Platelets 150 - 400 K/uL 182 188 208   CMP Latest Ref Rng & Units 11/07/2020 11/06/2020 11/05/2020  Glucose 70 - 99 mg/dL 145(H) 125(H) 134(H)  BUN 8 - 23 mg/dL 42(H) 31(H) 31(H)  Creatinine 0.61 - 1.24 mg/dL 0.81 0.75 0.75  Sodium 135 - 145  mmol/L 139 136 135  Potassium 3.5 - 5.1 mmol/L 4.1 4.2 4.3  Chloride 98 - 111 mmol/L 108 104 104  CO2 22 - 32 mmol/L _0 Calcium 8.9 - 10.3 mg/dL 8.7(L) 8.5(L) 8.8(L)  Total Protein 6.5 - 8.1 g/dL 4.9(L) 5.4(L) 5.8(L)  Total Bilirubin  0.3 - 1.2 mg/dL 0.3 0.4 0.5  Alkaline Phos 38 - 126 U/L 74 61 72  AST 15 - 41 U/L 18 12(L) 14(L)  ALT 0 - 44 U/L _1 DIAGNOSTIC IMAGING:  I have independently reviewed the scans and discussed with the patient. CT ABDOMEN WO CONTRAST  Result Date: 11/03/2020 CLINICAL DATA:  Evaluate anatomy for gastrostomy tube placement EXAM: CT ABDOMEN WITHOUT CONTRAST TECHNIQUE: Multidetector CT imaging of the abdomen was performed following the standard protocol without IV contrast. COMPARISON:  CT abdomen pelvis with contrast 10/10/2020 FINDINGS: Lower chest: Unchanged elevation of the left hemidiaphragm. Increasing opacity in the left lower lobe suspicious for progressive pneumonia. Multiple nodules again seen in the right lower lobe. Hepatobiliary: No focal liver abnormality is seen. No gallstones, gallbladder wall thickening, or biliary dilatation. Pancreas: Unremarkable. No pancreatic ductal dilatation or surrounding inflammatory changes. Spleen: Normal in size without focal abnormality. Adrenals/Urinary Tract: 3.2 cm simple cyst seen in the upper pole of the left kidney. 2 cm simple cyst seen in the anterolateral cortex of the left renal upper pole. Kidneys and ureters otherwise unremarkable Stomach/Bowel: Anatomy is amenable to percutaneous gastrostomy tube placement. No dilated loops of bowel to indicate ileus or obstruction within the visualized abdomen. Vascular/Lymphatic: Diffuse atherosclerotic calcifications seen throughout the abdominal aorta without aneurysmal dilatation. No enlarged abdominal lymph nodes. Other: No abdominal wall hernia or abnormality. Musculoskeletal: No acute or significant osseous findings. IMPRESSION: 1. Anatomy amenable to percutaneous gastrostomy tube placement. 2. Multiple right lower lobe pulmonary nodules again seen. Follow-up chest CT recommended in 3 months. Electronically Signed   By: Miachel Roux M.D.   On: 11/03/2020 09:29   MR BRAIN W WO CONTRAST  Result Date:  10/27/2020 CLINICAL DATA:  70 year old male with history of metastatic small cell carcinoma. Encephalopathy after fall last month. Rhabdomyolysis. Unexplained neurologic deficits, dizziness, double vision. Restaging. EXAM: MRI HEAD WITHOUT AND WITH CONTRAST TECHNIQUE: Multiplanar, multiecho pulse sequences of the brain and surrounding structures were obtained without and with intravenous contrast. CONTRAST:  6.75m GADAVIST GADOBUTROL 1 MMOL/ML IV SOLN COMPARISON:  Head CT without contrast 10/15/2020 and earlier. Brain MRI 09/07/2019. CTA head and neck 10/10/2020, and earlier. FINDINGS: Brain: Ventriculomegaly, new since last year with dilated temporal horns. Up to mild transependymal edema. No restricted diffusion or evidence of acute infarction. But there is abnormal diffusion associated with multiple enhancing brain masses. The largest is a lobulated and heterogeneously enhancing 39 mm mass in the central lower cerebellum (series 11, image 12). There are 2 additional nearby cerebellar metastases, 23 mm in the left superior and up to 24 mm in the right superior cerebellum (same image). Subsequent severe cerebellar edema. Partially effaced 4th ventricle and basilar cisterns. Partially effaced cerebral aqueduct. No tonsillar herniation. Additional round 13 mm right caudate metastasis on series 10, image 27. mild edema at that site. Each of the lesions demonstrates mild hemosiderin. No supratentorial midline shift. No dural thickening. Superimposed widespread chronic cerebral white matter T2 and FLAIR hyperintensity. Superimposed scattered chronic microhemorrhages elsewhere in the brain, likely small vessel related and not significantly changed since last year allowing for SWI technique today. No extra-axial collection or acute intracranial  hemorrhage. Pituitary within normal limits. Vascular: Major intracranial vascular flow voids are stable since last year. Skull and upper cervical spine: Negative visible upper  cervical spine, spinal cord. Visualized bone marrow signal is within normal limits. Sinuses/Orbits: Postoperative changes to both globes. Orbits appear stable and negative. Normal suprasellar cistern. Normal cavernous sinus. Paranasal Visualized paranasal sinuses and mastoids are stable and well aerated. Other: Chronic left mastoid effusion has mildly increased. Rim enhancing multi cystic appearing mass at the left fossa of Rosenmuller is new since 2018, and significantly increased since last year now measuring up to the 3 cm (series 11, image 19). This does not seem to directly correspond to what were thought to be small benign midline nasopharyngeal cysts in 2018. And this has progressed from a neck CT 09/03/2020 Other visible upper neck soft tissues appear negative. IMPRESSION: 1. 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 2. Subsequent severe cerebellar edema with partially effaced 4th ventricle and cerebral aqueduct, and associated hydrocephalus with mild transependymal edema. Recommend Neurosurgery consultation. 3. Four brain metastases overall, including a 13 mm right caudate lesion. 4. Suspected cystic metastasis or ex-nodal disease which is enlarging at the left nasopharynx, fossa of Rosenmuller since February. In August last year this lesion was suspected to be cystic/necrotic lymphadenopathy by Neck CT. Study discussed by telephone with Dr. Dia Crawford on 10/27/2020 at 11:35 . Electronically Signed   By: Genevie Ann M.D.   On: 10/27/2020 11:41   DG CHEST PORT 1 VIEW  Result Date: 10/25/2020 CLINICAL DATA:  Short of breath EXAM: PORTABLE CHEST 1 VIEW COMPARISON:  10/23/2020 FINDINGS: COPD with hyperexpansion and scarring in the bases. Mild left lower lobe airspace disease slightly improved. No effusion or edema. Port-A-Cath tip in the lower SVC. Feeding tube enters the stomach with the tip not visualized. IMPRESSION: COPD.  Improvement in left  lower lobe atelectasis/infiltrate. Electronically Signed   By: Franchot Gallo M.D.   On: 10/25/2020 17:27   DG Swallowing Func-Speech Pathology  Result Date: 11/05/2020 Objective Swallowing Evaluation: Type of Study: MBS-Modified Barium Swallow Study  Patient Details Name: WAGNER TANZI MRN: [id number] Date of Birth: 04/04/1951 Today's Date: 11/05/2020 Time: SLP Start Time (ACUTE ONLY): 2952 -SLP Stop Time (ACUTE ONLY): 1505 SLP Time Calculation (min) (ACUTE ONLY): 20 min Past Medical History: Past Medical History: Diagnosis Date . Anxiety  . CAD (coronary artery disease)  . Cancer ([place])   stage 4 small cell lung cancer  . COPD (chronic obstructive pulmonary disease) ([place])  . Depression  . Dyspnea   increased exertion . Feeling of chest tightness  . Heart palpitations  . History of chemotherapy  . Myocardial infarction ([place])  . Osteopenia  . Panic attacks  . Smoker  Past Surgical History: Past Surgical History: Procedure Laterality Date . BACK SURGERY  12/24/2000  L5,S1 . CORONARY STENT PLACEMENT  2005  RCA & CX . HERNIA REPAIR Right 1980's . INGUINAL HERNIA REPAIR  12/1978  right side . IR FLUORO GUIDE PORT INSERTION RIGHT  04/02/2017 . IR US GUIDE BX ASP/DRAIN  02/03/2017 . IR US GUIDE VASC ACCESS RIGHT  04/02/2017 . NM MYOCAR PERF WALL MOTION  09/07/2009  No ischemia; EF 51% . SHOULDER SURGERY Left 08/2010 . SPINE SURGERY  2002  L5-S1 . TOTAL HIP ARTHROPLASTY Left 10/12/2020  Procedure: TOTALhemi  HIP ARTHROPLASTY ANTERIOR APPROACH;  Surgeon: [name], MD;  Location: [facility];  Service: Orthopedics;  Laterality: Left; HPI: Pt is  a 70 yo male admitted 3/30 with AMS after fall with unknown downtime (1-3 days suspected). Pt was found to have L hip fx, L L3 transverse process fx, L frontal sinus fx, and AKI with rhabdomyolysis. On 3/31 pt had worsening hypoxia thought to be related to an aspiration event, requiring ETT 3/31-4/2; another suspected aspiration event on requiring re-intubation 4/4-4/6. 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 Pt was reporting intermittent difficulty swallowing solids in 2019 per cancer center notes. Palliative care has been consulted and as of 4/13, family would like full scope of care. MBS 4/11: oropharyngeal dysphagia characterized by reduced bolus cohesion, a pharyngeal delay, reduced lingual retraction, and reduced anterior laryngeal movement. He demonstrated premature spillage to the valleculae with inconsistent spillover to the pyriform sinuses, base of tongue residue, vallecular residue, pyriform sinus residue, penetration (PAS 3, 5) and inconsistent aspiration (PAS 7) of thin and nectar thick iquids. CXR 4/12: Mildly improved left lower lobe pneumonia. Mild changes of COPD.  Subjective: pleasant, "Ill do what you want me to do" Assessment / Plan / Recommendation CHL IP CLINICAL IMPRESSIONS 11/05/2020 Clinical Impression Patient presents with an improved swallow function as compared to previous MBS on 4/11. He continues to exhibit oral phase delays in transit of liquids and solids, leading to premature spillage of puree and regular solids into vallecular sinus. Pharyngeally, he continues to exhibit consistent penetration of thin liquids, however penetrate is trace and does not remain in laryngeal vestibule. No deep penetration or aspiration was observed during this study even when patient instructed to take straw sips and large volume cup sips. Chin tuck posture with thin liquids was observed during first sip of thin liquids but did not appear to make significant improvements. Patient exhibited minimal vallecular residuals of puree and regular solids post initial swallow, and trace to minimal vallecular, pyriform and lateral channel residuals with thin liquids. Residuals would clear out of pharynx with subsequent dry swallows. No penetration or aspiration observed from pharyngeal  residuals. SLP is recommending upgrade of patient's liquids from nectar thick to thin but wait for bedside trials of dysphagia 3 solids before upgrading solid textures. SLP Visit Diagnosis Dysphagia, oropharyngeal phase (R13.12) Attention and concentration deficit following -- Frontal lobe and executive function deficit following -- Impact on safety and function Mild aspiration risk   CHL IP TREATMENT RECOMMENDATION 11/05/2020 Treatment Recommendations Therapy as outlined in treatment plan below   Prognosis 11/05/2020 Prognosis for Safe Diet Advancement Good Barriers to Reach Goals -- Barriers/Prognosis Comment -- CHL IP DIET RECOMMENDATION 11/05/2020 SLP Diet Recommendations Dysphagia 2 (Fine chop) solids;Thin liquid Liquid Administration via Cup;Straw Medication Administration Whole meds with liquid Compensations Slow rate;Small sips/bites;Minimize environmental distractions;Follow solids with liquid Postural Changes Seated upright at 90 degrees   CHL IP OTHER RECOMMENDATIONS 11/05/2020 Recommended Consults -- Oral Care Recommendations Oral care BID Other Recommendations --   CHL IP FOLLOW UP RECOMMENDATIONS 11/05/2020 Follow up Recommendations Skilled Nursing facility   [facility] IP FREQUENCY AND DURATION 11/05/2020 Speech Therapy Frequency (ACUTE ONLY) min 2x/week Treatment Duration 2 weeks      CHL IP ORAL PHASE 11/05/2020 Oral Phase Impaired Oral - Pudding Teaspoon -- Oral - Pudding Cup -- Oral - Honey Teaspoon -- Oral - Honey Cup -- Oral - Nectar Teaspoon -- Oral - Nectar Cup Delayed oral transit Oral - Nectar Straw NT Oral - Thin Teaspoon NT Oral - Thin Cup -- Oral - Thin Straw  Delayed oral transit Oral - Puree Delayed oral transit;Premature spillage Oral - Mech Soft NT Oral - Regular Impaired mastication;Delayed oral transit;Premature spillage Oral - Multi-Consistency -- Oral - Pill Decreased bolus cohesion Oral Phase - Comment --  CHL IP PHARYNGEAL PHASE 11/05/2020 Pharyngeal Phase Impaired Pharyngeal- Pudding Teaspoon  -- Pharyngeal -- Pharyngeal- Pudding Cup -- Pharyngeal -- Pharyngeal- Honey Teaspoon -- Pharyngeal -- Pharyngeal- Honey Cup -- Pharyngeal -- Pharyngeal- Nectar Teaspoon -- Pharyngeal -- Pharyngeal- Nectar Cup Delayed swallow initiation-vallecula;Reduced airway/laryngeal closure;Penetration/Aspiration during swallow;Pharyngeal residue - valleculae;Pharyngeal residue - pyriform Pharyngeal Material enters airway, remains ABOVE vocal cords then ejected out Pharyngeal- Nectar Straw NT Pharyngeal -- Pharyngeal- Thin Teaspoon -- Pharyngeal -- Pharyngeal- Thin Cup Delayed swallow initiation-vallecula;Pharyngeal residue - valleculae;Pharyngeal residue - pyriform;Penetration/Aspiration during swallow;Reduced airway/laryngeal closure;Lateral channel residue Pharyngeal Material enters airway, remains ABOVE vocal cords then ejected out Pharyngeal- Thin Straw Delayed swallow initiation-vallecula;Penetration/Aspiration during swallow;Reduced airway/laryngeal closure;Pharyngeal residue - valleculae;Pharyngeal residue - pyriform;Lateral channel residue Pharyngeal Material enters airway, remains ABOVE vocal cords then ejected out Pharyngeal- Puree Pharyngeal residue - valleculae;Pharyngeal residue - pyriform Pharyngeal -- Pharyngeal- Mechanical Soft -- Pharyngeal -- Pharyngeal- Regular Pharyngeal residue - valleculae;Pharyngeal residue - pyriform Pharyngeal -- Pharyngeal- Multi-consistency -- Pharyngeal -- Pharyngeal- Pill WFL Pharyngeal -- Pharyngeal Comment --  CHL IP CERVICAL ESOPHAGEAL PHASE 11/05/2020 Cervical Esophageal Phase WFL Pudding Teaspoon -- Pudding Cup -- Honey Teaspoon -- Honey Cup -- Nectar Teaspoon -- Nectar Cup -- Nectar Straw -- Thin Teaspoon -- Thin Cup -- Thin Straw -- Puree -- Mechanical Soft -- Regular -- Multi-consistency -- Pill -- Cervical Esophageal Comment -- Sonia Baller, MA, CCC-SLP Speech Therapy             DG HIP PORT UNILAT WITH PELVIS 1V LEFT  Result Date: 10/30/2020 CLINICAL DATA:  Fall.   Left hip pain. EXAM: DG HIP (WITH OR WITHOUT PELVIS) 1V PORT LEFT COMPARISON:  10/12/2020. FINDINGS: Left hip replacement. Hardware intact. Anatomic alignment. Diffuse osteopenia. Degenerative changes lumbar spine and right hip. No acute bony or joint abnormality. Barium noted throughout the colon. Pelvic calcifications consistent with phleboliths. IMPRESSION: 1.  Left hip replacement.  Hardware intact.  Anatomic alignment. 2. Diffuse osteopenia. Degenerative changes lumbar spine and right hip. No acute bony abnormality. Electronically Signed   By: Marcello Moores  Register   On: 10/30/2020 11:19     ASSESSMENT:  1. 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 -CT CAP on 12/15/2019 showed enlarging prevascular and right paratracheal lymph nodes. Stable left lower lobe lung nodules. -CT neck on 12/15/2019 showed malignant lymphadenopathy in the lower right posterior triangle and jugular chain. -Lurbinectidin 3.91m/kgstarted on 12/22/2019. -CT neck on 02/23/2020 showed marked reduction of lower right neck and right supraclavicular adenopathy. -CT CAP on 02/23/2020 showed treatment response with resolved mediastinal adenopathy. No evidence of new or progressive metastatic disease. Scattered bilateral pulmonary nodules. -CT CAP from 05/17/2020 with no evidence of recurrence or metastatic disease. Stable irregular pulmonary nodules bilaterally. -CT neck from 05/17/2020 showed unchanged 2.1 x 1.4 cm peripherally enhancing hypodense lesion within the left nasopharyngeal region, favored to be a necrotic lymph node. No pathologically enlarged lymph nodes in the neck.   PLAN:  1. 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 - He was admitted to the hospital from 10/10/2020 through 11/10/2020 when he was found unresponsive at home.  He was diagnosed with left femoral neck fracture, facial and L3 transverse  process  fractures, encephalopathy and rhabdomyolysis, acute kidney injury.  Also required intubation from 10/11/2020-10/11/2020 - 10/17/2020. - We have reviewed scans during hospitalization.  No other progression other than slight increase in the right neck lymph nodes and new brain metastasis. - He is currently at [facility] in [place] undergoing rehab therapy.  He has at least 2 more weeks of rehab left before he will move to his brother's house. - He has refused PEG tube placement.  He reports that he is eating very well and is also drinking about 2 cans of boost per day. - He reported diarrhea.  Likely from boost.  I have told him to hold boost and see if it helps. - He also reported difficulty initiating urination.  We will start him on Flomax 0.4 mg daily. - He is currently walking with help of walker and assistance.  I will reevaluate him in 3 weeks.  He has agreed that he will enroll in hospice if there is no improvement in his functional status. - Reviewed his labs today which showed hemoglobin 12.  Renal function has normalized.  LFTs are normal. - Physical examination showed 2.5 cm right neck lymph node.  We will follow up on it.  Will consider reimaging based on his functional status in 3 weeks.  2. Normocytic anemia: -This is from myelosuppression.  Hemoglobin improved to 12.  3. Chronic pain: -Continue Percocet as needed.  4. Peripheral neuropathy: -Continue gabapentin 600 mg 3 times a day.  5.  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 - Whole brain radiation therapy was given starting 10/28/2020.   Orders placed this encounter:  No orders of the defined types were placed in this encounter.    Derek Jack, MD [place] [phone]   I, Thana Ates, am acting as a scribe for Dr. Sanda Linger.  I, Derek Jack MD, have reviewed the above documentation for accuracy and completeness, and I agree with  the above.

## 2020-11-22 NOTE — Patient Instructions (Addendum)
[place] at [facility] Discharge Instructions  You were seen today by Dr. Delton Coombes. He went over your recent results. You will begin taking Flomax for your difficulty urinating, and stop drinking Ensure/Boost. Dr. Delton Coombes will see you back in 3 weeks for labs and follow up.   Thank you for choosing [place] at [facility] to provide your oncology and hematology care.  To afford each patient quality time with our provider, please arrive at least 15 minutes before your scheduled appointment time.   If you have a lab appointment with the [facility] please come in thru the Main Entrance and check in at the main information desk  You need to re-schedule your appointment should you arrive 10 or more minutes late.  We strive to give you quality time with our providers, and arriving late affects you and other patients whose appointments are after yours.  Also, if you no show three or more times for appointments you may be dismissed from the clinic at the providers discretion.     Again, thank you for choosing [facility].  Our hope is that these requests will decrease the amount of time that you wait before being seen by our physicians.       _____________________________________________________________  Should you have questions after your visit to [facility], please contact our office at [phone] between the hours of 8:00 a.m. and 4:30 p.m.  Voicemails left after 4:00 p.m. will not be returned until the following business day.  For prescription refill requests, have your pharmacy contact our office and allow 72 hours.    Cancer Center Support Programs:   > Cancer Support Group  2nd Tuesday of the month 1pm-2pm, Journey Room

## 2020-11-26 ENCOUNTER — Institutional Professional Consult (permissible substitution): Payer: Medicare HMO | Admitting: Pulmonary Disease

## 2020-11-27 ENCOUNTER — Other Ambulatory Visit (HOSPITAL_COMMUNITY): Payer: Self-pay | Admitting: Surgery

## 2020-11-27 ENCOUNTER — Encounter: Payer: Medicare HMO | Admitting: Orthopaedic Surgery

## 2020-11-27 ENCOUNTER — Telehealth (HOSPITAL_COMMUNITY): Payer: Self-pay | Admitting: Surgery

## 2020-11-27 DIAGNOSIS — D508 Other iron deficiency anemias: Secondary | ICD-10-CM

## 2020-11-27 DIAGNOSIS — C3490 Malignant neoplasm of unspecified part of unspecified bronchus or lung: Secondary | ICD-10-CM

## 2020-11-27 MED ORDER — LOPERAMIDE HCL 2 MG PO TABS
2.0000 mg | ORAL_TABLET | Freq: Four times a day (QID) | ORAL | 3 refills | Status: AC | PRN
Start: 2020-11-27 — End: ?

## 2020-11-27 MED ORDER — TAMSULOSIN HCL 0.4 MG PO CAPS
0.4000 mg | ORAL_CAPSULE | Freq: Every day | ORAL | 3 refills | Status: AC
Start: 2020-11-27 — End: ?

## 2020-11-27 NOTE — Telephone Encounter (Signed)
I spoke the pt's brother Levada Dy while the pt was in the room.  The pt is now at the [facility] in [place], and the pt is in need of his Flomax and Immodium prescriptions.  The prescriptions had been sent to the pt's primary pharmacy on 11/22/20.    I spoke to the nurse at the [facility], as well as Dr. Delton Coombes, and the prescriptions for those 2 medications were faxed to the [facility].

## 2020-11-28 DIAGNOSIS — F419 Anxiety disorder, unspecified: Secondary | ICD-10-CM | POA: Diagnosis not present

## 2020-11-28 DIAGNOSIS — Z79899 Other long term (current) drug therapy: Secondary | ICD-10-CM | POA: Diagnosis not present

## 2020-11-28 DIAGNOSIS — Z760 Encounter for issue of repeat prescription: Secondary | ICD-10-CM | POA: Diagnosis not present

## 2020-11-28 DIAGNOSIS — E785 Hyperlipidemia, unspecified: Secondary | ICD-10-CM | POA: Diagnosis not present

## 2020-11-30 ENCOUNTER — Encounter (HOSPITAL_COMMUNITY): Payer: Self-pay

## 2020-12-01 DIAGNOSIS — S72002D Fracture of unspecified part of neck of left femur, subsequent encounter for closed fracture with routine healing: Secondary | ICD-10-CM | POA: Diagnosis not present

## 2020-12-01 DIAGNOSIS — S0292XD Unspecified fracture of facial bones, subsequent encounter for fracture with routine healing: Secondary | ICD-10-CM | POA: Diagnosis not present

## 2020-12-01 DIAGNOSIS — R278 Other lack of coordination: Secondary | ICD-10-CM | POA: Diagnosis not present

## 2020-12-01 DIAGNOSIS — R531 Weakness: Secondary | ICD-10-CM | POA: Diagnosis not present

## 2020-12-01 DIAGNOSIS — R2689 Other abnormalities of gait and mobility: Secondary | ICD-10-CM | POA: Diagnosis not present

## 2020-12-01 DIAGNOSIS — R41841 Cognitive communication deficit: Secondary | ICD-10-CM | POA: Diagnosis not present

## 2020-12-01 DIAGNOSIS — R1312 Dysphagia, oropharyngeal phase: Secondary | ICD-10-CM | POA: Diagnosis not present

## 2020-12-01 DIAGNOSIS — Z043 Encounter for examination and observation following other accident: Secondary | ICD-10-CM | POA: Diagnosis not present

## 2020-12-01 DIAGNOSIS — J9601 Acute respiratory failure with hypoxia: Secondary | ICD-10-CM | POA: Diagnosis not present

## 2020-12-01 DIAGNOSIS — S32038D Other fracture of third lumbar vertebra, subsequent encounter for fracture with routine healing: Secondary | ICD-10-CM | POA: Diagnosis not present

## 2020-12-01 DIAGNOSIS — M6281 Muscle weakness (generalized): Secondary | ICD-10-CM | POA: Diagnosis not present

## 2020-12-04 ENCOUNTER — Other Ambulatory Visit: Payer: Self-pay

## 2020-12-04 ENCOUNTER — Ambulatory Visit (INDEPENDENT_AMBULATORY_CARE_PROVIDER_SITE_OTHER): Payer: Medicare HMO

## 2020-12-04 ENCOUNTER — Ambulatory Visit (INDEPENDENT_AMBULATORY_CARE_PROVIDER_SITE_OTHER): Payer: Medicare HMO | Admitting: Orthopaedic Surgery

## 2020-12-04 ENCOUNTER — Encounter: Payer: Self-pay | Admitting: Radiology

## 2020-12-04 DIAGNOSIS — J9601 Acute respiratory failure with hypoxia: Secondary | ICD-10-CM | POA: Diagnosis not present

## 2020-12-04 DIAGNOSIS — R1312 Dysphagia, oropharyngeal phase: Secondary | ICD-10-CM | POA: Diagnosis not present

## 2020-12-04 DIAGNOSIS — Z96642 Presence of left artificial hip joint: Secondary | ICD-10-CM

## 2020-12-04 DIAGNOSIS — S0292XD Unspecified fracture of facial bones, subsequent encounter for fracture with routine healing: Secondary | ICD-10-CM | POA: Diagnosis not present

## 2020-12-04 DIAGNOSIS — R2689 Other abnormalities of gait and mobility: Secondary | ICD-10-CM | POA: Diagnosis not present

## 2020-12-04 DIAGNOSIS — S72002D Fracture of unspecified part of neck of left femur, subsequent encounter for closed fracture with routine healing: Secondary | ICD-10-CM | POA: Diagnosis not present

## 2020-12-04 DIAGNOSIS — Z043 Encounter for examination and observation following other accident: Secondary | ICD-10-CM | POA: Diagnosis not present

## 2020-12-04 DIAGNOSIS — R278 Other lack of coordination: Secondary | ICD-10-CM | POA: Diagnosis not present

## 2020-12-04 DIAGNOSIS — S32038D Other fracture of third lumbar vertebra, subsequent encounter for fracture with routine healing: Secondary | ICD-10-CM | POA: Diagnosis not present

## 2020-12-04 DIAGNOSIS — M6281 Muscle weakness (generalized): Secondary | ICD-10-CM | POA: Diagnosis not present

## 2020-12-04 DIAGNOSIS — R41841 Cognitive communication deficit: Secondary | ICD-10-CM | POA: Diagnosis not present

## 2020-12-04 NOTE — Progress Notes (Signed)
Post-Op Visit Note   Patient: [name]           Date of Birth: 21-Jan-1951           MRN: [id number] Visit Date: 12/04/2020 PCP: David Frizzle, MD   Assessment & Plan:  Chief Complaint:  Chief Complaint  Patient presents with  . Left Hip - Follow-up   Visit Diagnoses:  1. History of total left hip replacement     Plan:   [name] is approximately 7 weeks status post left hip hemiarthroplasty for femoral neck fracture.  He presents today for his first postoperative appointment.  Sutures were removed at the facility.  He is currently at the [facility] in [place].  He is walking small distances with a walker.  Ambulating with a wheelchair for longer distances.  He denies any hip pain when standing.  He has groin pain when he flexes his hip.  Left hip scar is fully healed.  X-rays are unremarkable.  Painless rotation of the hip.  I recommend aggressive physical therapy for strengthening as he is severely deconditioned from the hospitalization and the surgery.  From a orthopedic standpoint everything looks stable and well-healed.  We will see him back as needed.  Follow-Up Instructions: Return if symptoms worsen or fail to improve.   Orders:  Orders Placed This Encounter  Procedures  . XR HIP UNILAT W OR W/O PELVIS 2-3 VIEWS LEFT   No orders of the defined types were placed in this encounter.   Imaging: XR HIP UNILAT W OR W/O PELVIS 2-3 VIEWS LEFT  Result Date: 12/04/2020 Stable left partial hip replacement without complication.   PMFS History: Patient Active Problem List   Diagnosis Date Noted  . Primary malignant neoplasm of lung with metastasis to brain ([place]) 10/27/2020  . Essential hypertension 10/27/2020  . Thrombocytopenia ([place]) 10/27/2020  . Tobacco abuse 10/27/2020  . Sacral pressure ulcer 10/25/2020  . Orthostatic hypotension 10/25/2020  . Oropharyngeal dysphagia   . Status post total replacement of left hip   . Non-traumatic rhabdomyolysis   .  Fall   . Left-sided weakness   . Acute respiratory failure with hypoxia ([place])   . Protein-calorie malnutrition, severe 10/16/2020  . Closed fracture of left hip ([place])   . Pressure injury of skin 10/11/2020  . Sepsis without septic shock ([place])   . Left displaced femoral neck fracture ([place])   . Delirium   . Closed fracture of frontal sinus ([place])   . Closed fracture of transverse process of lumbar vertebra ([place])   . Goals of care, counseling/discussion 12/19/2019  . Iron deficiency anemia 09/12/2019  . Small cell lung cancer in adult [facility]) 02/05/2017  . Lymphadenopathy of head and neck 01/22/2017  . Osteopenia 03/30/2015  . Clubbing of fingers 04/14/2014  . Hyperglycemia 10/20/2013  . Smoker   . Closed dislocation of acromioclavicular joint 04/11/2010  . BREAST MASS, LEFT 01/20/2008  . ONYCHOMYCOSIS, TOENAILS 10/20/2007  . MUSCLE CRAMPS 10/20/2007  . Hypercholesterolemia 05/28/2007  . CATARACT NOS 08/28/2006  . DISTURBANCE, VISUAL NOS 08/19/2006  . COPD (chronic obstructive pulmonary disease) with emphysema ([place]) 08/19/2006  . WITHDRAWAL, DRUG 06/24/2006  . ANXIETY 06/24/2006  . DEPRESSION 06/24/2006  . MYOCARDIAL INFARCTION, HX OF 06/24/2006  . Coronary atherosclerosis 06/24/2006  . CARDIAC ARRHYTHMIA 06/24/2006  . CONSTIPATION 06/24/2006  . OSTEOARTHRITIS 06/24/2006  . LOW BACK PAIN 06/24/2006  . INSOMNIA 06/24/2006   Past Medical History:  Diagnosis Date  . Anxiety   . CAD (  coronary artery disease)   . Cancer ([place])    stage 4 small cell lung cancer   . COPD (chronic obstructive pulmonary disease) ([place])   . Depression   . Dyspnea    increased exertion  . Feeling of chest tightness   . Heart palpitations   . History of chemotherapy   . History of radiation therapy 10/29/2020-11/08/2020   whole brain    Dr Gery Pray  . Myocardial infarction ([place])   . Osteopenia   . Panic attacks   . Smoker     Family History  Problem Relation Age of Onset  . Heart attack  Father   . Kidney disease Father        renal failure  . Heart failure Mother   . Heart attack Mother   . Cancer Brother   . Diabetes Brother   . Alcohol abuse Brother   . Diabetes Sister     Past Surgical History:  Procedure Laterality Date  . BACK SURGERY  12/24/2000   L5,S1  . CORONARY STENT PLACEMENT  2005   RCA & CX  . HERNIA REPAIR Right 1980's  . INGUINAL HERNIA REPAIR  12/1978   right side  . IR FLUORO GUIDE PORT INSERTION RIGHT  04/02/2017  . IR US GUIDE BX ASP/DRAIN  02/03/2017  . IR US GUIDE VASC ACCESS RIGHT  04/02/2017  . NM MYOCAR PERF WALL MOTION  09/07/2009   No ischemia; EF 51%  . SHOULDER SURGERY Left 08/2010  . SPINE SURGERY  2002   L5-S1  . TOTAL HIP ARTHROPLASTY Left 10/12/2020   Procedure: TOTALhemi  HIP ARTHROPLASTY ANTERIOR APPROACH;  Surgeon: [name], MD;  Location: [facility];  Service: Orthopedics;  Laterality: Left;   Social History   Occupational History  . Not on file  Tobacco Use  . Smoking status: Current Every Day Smoker    Packs/day: 1.00    Types: Cigarettes  . Smokeless tobacco: Never Used  Vaping Use  . Vaping Use: Never used  Substance and Sexual Activity  . Alcohol use: Yes    Comment: occas  . Drug use: No  . Sexual activity: Not on file

## 2020-12-11 ENCOUNTER — Telehealth: Payer: Self-pay | Admitting: Family Medicine

## 2020-12-11 DIAGNOSIS — L603 Nail dystrophy: Secondary | ICD-10-CM | POA: Diagnosis not present

## 2020-12-11 DIAGNOSIS — M2041 Other hammer toe(s) (acquired), right foot: Secondary | ICD-10-CM | POA: Diagnosis not present

## 2020-12-11 DIAGNOSIS — M2042 Other hammer toe(s) (acquired), left foot: Secondary | ICD-10-CM | POA: Diagnosis not present

## 2020-12-11 DIAGNOSIS — B351 Tinea unguium: Secondary | ICD-10-CM | POA: Diagnosis not present

## 2020-12-11 DIAGNOSIS — I7091 Generalized atherosclerosis: Secondary | ICD-10-CM | POA: Diagnosis not present

## 2020-12-11 NOTE — Telephone Encounter (Signed)
Pt brother called in stating that pt is at [facility], and currently is not doing well, and would like to talk about getting pt to [facility]. He asks if Dr or nurse please contact with info about making this change. Please advise  Cb#: [phone]

## 2020-12-11 NOTE — Telephone Encounter (Signed)
Call placed to facility to inquire. Was advised that patient is having major decline with lung cancer and brain mets. States that NP spoke with patient son Levada Dy about bringing in Hospice services. States that NP has placed referral.   Call placed to patient son Levada Dy and made aware. Also advised that patient is bing followed by house MD, Dr Estevan Ryder. Verbalized understanding.

## 2020-12-12 ENCOUNTER — Other Ambulatory Visit: Payer: Self-pay

## 2020-12-12 ENCOUNTER — Non-Acute Institutional Stay: Payer: Self-pay | Admitting: Nurse Practitioner

## 2020-12-12 DIAGNOSIS — L89306 Pressure-induced deep tissue damage of unspecified buttock: Secondary | ICD-10-CM | POA: Diagnosis not present

## 2020-12-12 DIAGNOSIS — E638 Other specified nutritional deficiencies: Secondary | ICD-10-CM | POA: Diagnosis not present

## 2020-12-12 DIAGNOSIS — R296 Repeated falls: Secondary | ICD-10-CM | POA: Diagnosis not present

## 2020-12-13 ENCOUNTER — Ambulatory Visit (INDEPENDENT_AMBULATORY_CARE_PROVIDER_SITE_OTHER): Payer: Medicare HMO | Admitting: Pulmonary Disease

## 2020-12-13 ENCOUNTER — Encounter: Payer: Self-pay | Admitting: Pulmonary Disease

## 2020-12-13 ENCOUNTER — Institutional Professional Consult (permissible substitution): Payer: Medicare HMO | Admitting: Pulmonary Disease

## 2020-12-13 ENCOUNTER — Other Ambulatory Visit: Payer: Self-pay

## 2020-12-13 VITALS — BP 110/64 | HR 80 | Ht 69.0 in | Wt 131.0 lb

## 2020-12-13 DIAGNOSIS — J432 Centrilobular emphysema: Secondary | ICD-10-CM

## 2020-12-13 DIAGNOSIS — C3490 Malignant neoplasm of unspecified part of unspecified bronchus or lung: Secondary | ICD-10-CM | POA: Diagnosis not present

## 2020-12-13 DIAGNOSIS — C7931 Secondary malignant neoplasm of brain: Secondary | ICD-10-CM

## 2020-12-13 DIAGNOSIS — R64 Cachexia: Secondary | ICD-10-CM | POA: Diagnosis not present

## 2020-12-13 NOTE — Progress Notes (Signed)
Synopsis: Referred in June 2022 for lung mass by [name], MD  Subjective:   PATIENT ID: [name] GENDER: male DOB: 1951/01/11, MRN: [id number]  Chief Complaint  Patient presents with  . Consult    This is a 70 year old gentleman with a past medical history of stage IV extensive small cell carcinoma.  This was initially diagnosed in 2018.  Patient has underwent chemotherapy multiple cycles also has known liver metastasis.  Recently sent to see radiation oncology again.  He likely has severe emphysema and associated cancer related cachexia.  Currently managed with Stiolto plus as needed albuterol.  Respiratory symptoms are relatively stable unfortunately he still smokes mostly after meals.  He resides in a facility at the [facility] in [facility].   Oncology History  Small cell lung cancer in adult [facility])  01/16/2017 Imaging   CT neck: IMPRESSION: 1. Bulky 5.4 cm right supraclavicular region malignant lymph node conglomeration with extracapsular extension. 2. Surrounding smaller abnormal right level 3 and level 5 lymph nodes, and the lymphadenopathy continues into the superior mediastinum, see Chest CT findings reported separately. 3. No other metastatic disease identified in the neck.   01/16/2017 Imaging   CT chest: IMPRESSION: 1. Extensive lymphadenopathy in the thorax and lower right cervical region, as discussed above. Primary differential considerations include lymphoma/leukemia or small cell carcinoma of the lung. Further evaluation a PET-CT could be considered to assess for additional sites of disease below the diaphragm if clinically appropriate. Additionally, ultrasound-guided biopsy of supraclavicular lymphadenopathy could be considered to establish a tissue diagnosis. 2. Indeterminate lesion in the periphery of segment 8 of the liver measuring 2.7 x 1.7 cm. Attention at time of follow-up PET-CT is recommended. 3. Aortic atherosclerosis, in  addition to left main and 3 vessel coronary artery disease. Please note that although the presence of coronary artery calcium documents the presence of coronary artery disease, the severity of this disease and any potential stenosis cannot be assessed on this non-gated CT examination. Assessment for potential risk factor modification, dietary therapy or pharmacologic therapy may be warranted, if clinically indicated. 4. There are calcifications of the aortic valve. Echocardiographic correlation for evaluation of potential valvular dysfunction may be warranted if clinically indicated. 5. Diffuse bronchial wall thickening with moderate centrilobular and paraseptal emphysema; imaging findings suggestive of underlying COPD.   02/03/2017 Initial Biopsy   (R) neck lymph node biopsy: SMALL CELL CARCINOMA (most likely lung primary).    02/03/2017 Miscellaneous   Port-a-cath attempted by IR; unable to place d/t enlarged SVC.    02/05/2017 Initial Diagnosis   Extensive stage primary small cell carcinoma of lung ([place])   02/09/2017 - 05/27/2017 Chemotherapy   6 cycles of cisplatin+etoposide    02/11/2017 Imaging   MRI brain: CLINICAL DATA:  Advanced stage small cell lung cancer. Staging for metastatic disease  EXAM: MRI HEAD WITHOUT AND WITH CONTRAST  TECHNIQUE: Multiplanar, multiecho pulse sequences of the brain and surrounding structures were obtained without and with intravenous contrast.  CONTRAST:  25m MULTIHANCE GADOBENATE DIMEGLUMINE 529 MG/ML IV SOLN  COMPARISON:  None.  FINDINGS: Brain: Negative for hydrocephalus. Cerebral volume normal for age. Small nonenhancing white matter hyperintensities consistent with mild chronic microvascular ischemia. No acute infarct. Negative for hemorrhage or mass or edema  Normal enhancement postcontrast infusion. No enhancing mass lesion. Leptomeningeal enhancement is normal.  Vascular: Normal arterial flow voids.  Normal venous  enhancement  Skull and upper cervical spine: Negative  Sinuses/Orbits: Negative  Other: None  IMPRESSION: Negative for metastatic disease.  No acute abnormality.  Mild chronic white matter changes.   04/07/2017 Imaging    PET:  1. Marked reduction in size and metabolic activity of bulky RIGHT supraclavicular adenopathy mediastinal lymphadenopathy. 2. Residual moderate activity remains within small RIGHT supraclavicular lymph node, RIGHT lower paratracheal lymph node and RIGHT hilar lymph node. 3. Resolution of prevascular and internal mammary mediastinal metastatic hypermetabolic activity. 4. Resolution of metabolic activity associated with solitary RIGHT hepatic lobe liver metastasis. 5. No evidence of disease progression. 6. 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   05/27/2017 Imaging   MRI brain w/ and w/o contrast IMPRESSION: 1. No metastatic disease identified. 2. Increased nonspecific cerebral white matter signal changes since August. These are most commonly small vessel disease related. 3. New right maxillary sinusitis. Benign appearing retention cysts in the nasopharynx with trace mastoid effusions.   06/12/2017 Imaging   PET-CT IMPRESSION: 1. There are two new hypermetabolic nodules identified within both lower lobes measuring up to 3.1 cm. The appearance is nonspecific and may be inflammatory/infectious in etiology. Pulmonary metastatic disease cannot be excluded and short-term follow-up imaging in 3 months is advised to reassess these nodules. 2. Stable appearance of mild hypermetabolic activity associated with right paratracheal and right hilar lymph nodes. 3. Decrease in FDG uptake associated with index right supraclavicular lymph node. 4. No change in hypermetabolism associated with small right parotid gland lesion which suggest a primary parotid neoplasm such  as pleomorphic adenoma. 5. Aortic Atherosclerosis (ICD10-I70.0) and Emphysema (ICD10-J43.9).   08/24/2017 - 11/18/2019 Chemotherapy   The patient had pegfilgrastim (NEULASTA ONPRO KIT) injection 6 mg, 6 mg, Subcutaneous, Once, 30 of 31 cycles Administration: 6 mg (08/28/2017), 6 mg (09/18/2017), 6 mg (10/09/2017), 6 mg (11/02/2017), 6 mg (11/20/2017), 6 mg (12/18/2017), 6 mg (01/15/2018), 6 mg (02/12/2018), 6 mg (03/12/2018), 6 mg (04/09/2018), 6 mg (05/07/2018), 6 mg (06/04/2018), 6 mg (07/02/2018), 6 mg (07/30/2018), 6 mg (08/27/2018), 6 mg (09/24/2018), 6 mg (10/29/2018), 6 mg (11/26/2018), 6 mg (12/24/2018), 6 mg (01/21/2019), 6 mg (02/18/2019), 6 mg (03/18/2019), 6 mg (04/15/2019), 6 mg (05/13/2019), 6 mg (06/17/2019), 6 mg (07/22/2019), 6 mg (08/19/2019), 6 mg (09/16/2019), 6 mg (10/21/2019), 6 mg (11/18/2019) topotecan (HYCAMTIN) 2.9 mg in sodium chloride 0.9 % 100 mL chemo infusion, 1.5 mg/m2 = 2.9 mg, Intravenous,  Once, 30 of 31 cycles Dose modification: 1.2 mg/m2 (80 % of original dose 1.5 mg/m2, Cycle 4, Reason: Dose Not Tolerated) Administration: 2.9 mg (08/24/2017), 2.9 mg (08/25/2017), 2.9 mg (08/28/2017), 2.9 mg (08/26/2017), 2.9 mg (08/27/2017), 2.9 mg (09/14/2017), 2.9 mg (09/15/2017), 2.9 mg (09/16/2017), 2.9 mg (09/17/2017), 2.9 mg (09/18/2017), 2.9 mg (10/05/2017), 2.9 mg (10/06/2017), 2.9 mg (10/07/2017), 2.9 mg (10/08/2017), 2.9 mg (10/09/2017), 2.3 mg (10/26/2017), 2.3 mg (10/27/2017), 2.3 mg (10/28/2017), 2.3 mg (10/29/2017), 2.3 mg (11/02/2017), 2.3 mg (11/16/2017), 2.3 mg (11/17/2017), 2.3 mg (11/18/2017), 2.3 mg (11/19/2017), 2.3 mg (11/20/2017), 2.3 mg (12/14/2017), 2.3 mg (12/15/2017), 2.3 mg (12/16/2017), 2.3 mg (12/17/2017), 2.3 mg (12/18/2017), 2.3 mg (01/11/2018), 2.3 mg (01/12/2018), 2.3 mg (01/13/2018), 2.3 mg (01/15/2018), 2.3 mg (02/08/2018), 2.3 mg (02/09/2018), 2.3 mg (02/10/2018), 2.3 mg (02/11/2018), 2.3 mg (02/12/2018), 2.3 mg (03/08/2018), 2.3 mg (03/09/2018), 2.3 mg (03/10/2018), 2.3 mg (03/11/2018), 2.3 mg (03/12/2018), 2.2 mg (04/05/2018), 2.2 mg (04/06/2018), 2.2  mg (04/07/2018), 2.2 mg (04/08/2018), 2.2 mg (04/09/2018), 2.2 mg (05/03/2018), 2.2 mg (05/04/2018), 2.2 mg (05/05/2018), 2.2 mg (05/06/2018), 2.2 mg (05/07/2018), 2.2 mg (05/31/2018), 2.2  mg (06/01/2018), 2.2 mg (06/02/2018), 2.2 mg (06/03/2018), 2.2 mg (06/04/2018), 2.2 mg (06/28/2018), 2.2 mg (06/29/2018), 2.2 mg (06/30/2018), 2.2 mg (07/01/2018), 2.2 mg (07/02/2018), 2.2 mg (07/26/2018), 2.2 mg (07/27/2018), 2.2 mg (07/28/2018), 2.2 mg (07/29/2018), 2.2 mg (07/30/2018), 2.2 mg (08/23/2018), 2.2 mg (08/24/2018), 2.2 mg (08/25/2018), 2.2 mg (08/26/2018), 2.2 mg (08/27/2018), 2.2 mg (09/20/2018), 2.2 mg (09/21/2018), 2.2 mg (09/22/2018), 2.2 mg (09/23/2018), 2.2 mg (09/24/2018), 2.2 mg (10/25/2018), 2.2 mg (10/26/2018), 2.2 mg (10/27/2018), 2.2 mg (10/28/2018), 2.2 mg (10/29/2018), 2.2 mg (11/22/2018), 2.2 mg (11/23/2018), 2.2 mg (11/24/2018), 2.2 mg (11/25/2018), 2.2 mg (11/26/2018), 2.2 mg (12/20/2018), 2.2 mg (12/21/2018), 2.2 mg (12/22/2018), 2.2 mg (12/23/2018), 2.2 mg (12/24/2018), 2.2 mg (01/17/2019), 2.2 mg (01/18/2019), 2.2 mg (01/19/2019), 2.2 mg (01/20/2019), 2.2 mg (01/21/2019), 2.2 mg (02/14/2019), 2.2 mg (02/15/2019), 2.2 mg (02/16/2019), 2.2 mg (02/17/2019), 2.2 mg (02/18/2019), 2.2 mg (03/14/2019), 2.2 mg (03/15/2019), 2.2 mg (03/16/2019), 2.2 mg (03/17/2019), 2.2 mg (03/18/2019), 2.2 mg (04/11/2019), 2.2 mg (04/12/2019), 2.2 mg (04/13/2019), 2.2 mg (04/14/2019), 2.2 mg (04/15/2019), 2.2 mg (05/09/2019), 2.2 mg (05/10/2019), 2.2 mg (05/11/2019), 2.2 mg (05/12/2019), 2.2 mg (05/13/2019), 2.2 mg (06/13/2019), 2.2 mg (06/14/2019), 2.2 mg (06/15/2019), 2.2 mg (06/16/2019), 2.2 mg (06/17/2019), 2.2 mg (07/18/2019), 2.2 mg (07/19/2019), 2.2 mg (07/20/2019), 2.2 mg (07/21/2019), 2.2 mg (07/22/2019), 2.2 mg (08/15/2019), 2.2 mg (08/16/2019), 2.2 mg (08/17/2019), 2.2 mg (08/18/2019), 2.2 mg (08/19/2019), 2.2 mg (09/12/2019), 2.2 mg (09/13/2019), 2.2 mg (09/14/2019), 2.2 mg (09/15/2019), 2.2 mg (09/16/2019), 2.2 mg (10/17/2019), 2.2 mg (10/18/2019), 2.2 mg (10/19/2019), 2.2 mg (10/20/2019), 2.2 mg (10/21/2019), 2.2 mg  (11/14/2019), 2.2 mg (11/15/2019), 2.2 mg (11/16/2019), 2.2 mg (11/17/2019), 2.2 mg (11/18/2019) ondansetron (ZOFRAN) 4 mg in sodium chloride 0.9 % 50 mL IVPB, , Intravenous,  Once, 25 of 26 cycles Administration:  (02/08/2018),  (02/09/2018),  (02/10/2018),  (02/11/2018),  (02/12/2018),  (03/08/2018),  (03/09/2018),  (03/10/2018),  (03/11/2018),  (03/12/2018),  (04/05/2018),  (04/06/2018),  (04/07/2018),  (04/08/2018),  (04/09/2018),  (05/03/2018),  (05/04/2018),  (05/05/2018),  (05/06/2018),  (05/07/2018),  (05/31/2018),  (06/01/2018),  (06/02/2018),  (06/03/2018),  (06/04/2018),  (06/28/2018),  (06/29/2018),  (06/30/2018),  (07/01/2018),  (07/02/2018),  (07/26/2018),  (07/27/2018),  (07/28/2018),  (07/29/2018),  (07/30/2018),  (08/23/2018),  (08/24/2018),  (08/25/2018),  (08/26/2018),  (08/27/2018),  (09/20/2018),  (09/21/2018),  (09/22/2018),  (09/23/2018),  (09/24/2018),  (10/25/2018),  (10/26/2018),  (10/27/2018),  (10/28/2018),  (10/29/2018),  (11/22/2018),  (11/23/2018),  (11/24/2018),  (11/25/2018),  (11/26/2018),  (12/20/2018),  (12/21/2018),  (12/22/2018),  (12/23/2018),  (12/24/2018),  (01/17/2019),  (01/18/2019),  (01/19/2019),  (01/20/2019),  (01/21/2019),  (02/14/2019),  (02/15/2019),  (02/16/2019),  (02/17/2019),  (02/18/2019),  (03/14/2019),  (03/15/2019),  (03/16/2019),  (03/17/2019),  (03/18/2019),  (04/11/2019),  (04/12/2019),  (04/13/2019),  (04/14/2019),  (04/15/2019),  (05/09/2019),  (05/10/2019),  (05/11/2019),  (05/12/2019),  (05/13/2019),  (06/13/2019),  (06/14/2019),  (06/15/2019),  (06/16/2019),  (06/17/2019),  (07/18/2019),  (07/19/2019),  (07/20/2019),  (07/21/2019),  (07/22/2019),  (08/15/2019),  (08/16/2019),  (08/17/2019),  (08/18/2019),  (08/19/2019),  (09/12/2019),  (09/13/2019),  (09/14/2019),  (09/15/2019),  (09/16/2019),  (10/17/2019),  (10/18/2019),  (10/19/2019),  (10/20/2019),  (10/21/2019),  (11/14/2019),  (11/15/2019),  (11/16/2019),  (11/17/2019),  (11/18/2019)  for chemotherapy treatment.    12/22/2019 -  Chemotherapy    Patient is on Treatment Plan: LUNG SMALL CELL LURBINECTEDIN Q21D         Past  Medical History:  Diagnosis Date  . Anxiety   . CAD (coronary artery disease)   . Cancer ([place])    stage 4 small cell lung cancer   . COPD (  chronic obstructive pulmonary disease) ([place])   . Depression   . Dyspnea    increased exertion  . Feeling of chest tightness   . Heart palpitations   . History of chemotherapy   . History of radiation therapy 10/29/2020-11/08/2020   whole brain    Dr Gery Pray  . Myocardial infarction ([place])   . Osteopenia   . Panic attacks   . Smoker      Family History  Problem Relation Age of Onset  . Heart attack Father   . Kidney disease Father        renal failure  . Heart failure Mother   . Heart attack Mother   . Cancer Brother   . Diabetes Brother   . Alcohol abuse Brother   . Diabetes Sister      Past Surgical History:  Procedure Laterality Date  . BACK SURGERY  12/24/2000   L5,S1  . CORONARY STENT PLACEMENT  2005   RCA & CX  . HERNIA REPAIR Right 1980's  . INGUINAL HERNIA REPAIR  12/1978   right side  . IR FLUORO GUIDE PORT INSERTION RIGHT  04/02/2017  . IR US GUIDE BX ASP/DRAIN  02/03/2017  . IR US GUIDE VASC ACCESS RIGHT  04/02/2017  . NM MYOCAR PERF WALL MOTION  09/07/2009   No ischemia; EF 51%  . SHOULDER SURGERY Left 08/2010  . SPINE SURGERY  2002   L5-S1  . TOTAL HIP ARTHROPLASTY Left 10/12/2020   Procedure: TOTALhemi  HIP ARTHROPLASTY ANTERIOR APPROACH;  Surgeon: [name], MD;  Location: [facility];  Service: Orthopedics;  Laterality: Left;    Social History   Socioeconomic History  . Marital status: Single    Spouse name: Not on file  . Number of children: Not on file  . Years of education: Not on file  . Highest education level: Not on file  Occupational History  . Not on file  Tobacco Use  . Smoking status: Current Every Day Smoker    Packs/day: 1.00    Types: Cigarettes  . Smokeless tobacco: Never Used  Vaping Use  . Vaping Use: Never used  Substance and Sexual Activity  . Alcohol use: Yes    Comment: occas  .  Drug use: No  . Sexual activity: Not on file  Other Topics Concern  . Not on file  Social History Narrative  . Not on file   Social Determinants of Health   Financial Resource Strain: Low Risk   . Difficulty of Paying Living Expenses: Not hard at all  Food Insecurity: No Food Insecurity  . Worried About Charity fundraiser in the Last Year: Never true  . Ran Out of Food in the Last Year: Never true  Transportation Needs: No Transportation Needs  . Lack of Transportation (Medical): No  . Lack of Transportation (Non-Medical): No  Physical Activity: Inactive  . Days of Exercise per Week: 0 days  . Minutes of Exercise per Session: 0 min  Stress: No Stress Concern Present  . Feeling of Stress : Not at all  Social Connections: Socially Isolated  . Frequency of Communication with Friends and Family: More than three times a week  . Frequency of Social Gatherings with Friends and Family: More than three times a week  . Attends Religious Services: Never  . Active Member of Clubs or Organizations: No  . Attends Archivist Meetings: Never  . Marital Status: Never married  Intimate Partner Violence: Not At Risk  .  Fear of Current or Ex-Partner: No  . Emotionally Abused: No  . Physically Abused: No  . Sexually Abused: No     Allergies  Allergen Reactions  . Codeine Nausea Only  . Niaspan [Niacin Er]      Outpatient Medications Prior to Visit  Medication Sig Dispense Refill  . albuterol (VENTOLIN HFA) 108 (90 Base) MCG/ACT inhaler Inhale 2 puffs into the lungs every 6 (six) hours as needed for wheezing or shortness of breath.    . ALPRAZolam (XANAX) 1 MG tablet Take 1 tablet (1 mg total) by mouth 3 (three) times daily as needed for anxiety. 30 tablet 0  . Ascorbic Acid (VITAMIN C) 1000 MG tablet Take 1,000 mg by mouth daily.    Marland Kitchen aspirin EC 81 MG tablet Take 81 mg by mouth daily.    Marland Kitchen atorvastatin (LIPITOR) 40 MG tablet TAKE (1) TABLET BY MOUTH ONCE DAILY. (Patient taking  differently: Take 40 mg by mouth daily.) 90 tablet 0  . calcium-vitamin D (OSCAL WITH D) 250-125 MG-UNIT tablet Take 1 tablet by mouth daily.    . cholecalciferol (VITAMIN D3) 25 MCG (1000 UT) tablet Take 1,000 Units by mouth daily.    . diphenoxylate-atropine (LOMOTIL) 2.5-0.025 MG/5ML liquid Take 5 mLs by mouth daily. 60 mL 0  . docusate sodium (COLACE) 100 MG capsule Take two capsules at bedtime starting the day you receive chemotherapy and then for four days after 30 capsule 3  . gabapentin (NEURONTIN) 300 MG capsule TAKE (2) CAPSULES BY MOUTH THREE TIMES DAILY. (Patient taking differently: Take 600 mg by mouth 3 (three) times daily.) 180 capsule 6  . ipratropium-albuterol (DUONEB) 0.5-2.5 (3) MG/3ML SOLN Take 3 mLs by nebulization every 6 (six) hours as needed. 360 mL   . loperamide (IMODIUM A-D) 2 MG tablet Take 1 tablet (2 mg total) by mouth 4 (four) times daily as needed for diarrhea or loose stools. 30 tablet 3  . metoprolol tartrate (LOPRESSOR) 25 MG tablet Take 0.5 tablets (12.5 mg total) by mouth 2 (two) times daily. 180 tablet 0  . mirtazapine (REMERON) 15 MG tablet Take 2 tablets (30 mg total) by mouth at bedtime. 30 tablet 1  . Multiple Vitamins-Minerals (CENTRUM SILVER 50+MEN) TABS Take 1 tablet by mouth daily.    . nicotine (NICODERM CQ - DOSED IN MG/24 HOURS) 14 mg/24hr patch Place 1 patch (14 mg total) onto the skin daily. 28 patch 0  . Nutritional Supplements (,FEEDING SUPPLEMENT, PROSOURCE PLUS) liquid Take 30 mLs by mouth 2 (two) times daily between meals.    . ondansetron (ZOFRAN) 8 MG tablet Take 1 tablet (8 mg total) by mouth 2 (two) times daily as needed. Start on the third day after cisplatin chemotherapy. 30 tablet 1  . oxyCODONE-acetaminophen (PERCOCET/ROXICET) 5-325 MG tablet Take 1 tablet by mouth every 8 (eight) hours as needed for severe pain. 30 tablet 0  . prochlorperazine (COMPAZINE) 10 MG tablet Take 1 tablet (10 mg total) by mouth every 6 (six) hours as needed  (Nausea or vomiting). 60 tablet 1  . senna (SENOKOT) 8.6 MG tablet Take 3 tablets by mouth daily.     . tamsulosin (FLOMAX) 0.4 MG CAPS capsule Take 1 capsule (0.4 mg total) by mouth daily. 30 capsule 3  . Tiotropium Bromide-Olodaterol (STIOLTO RESPIMAT) 2.5-2.5 MCG/ACT AERS Inhale 1 puff into the lungs daily.    . vitamin B-12 (CYANOCOBALAMIN) 100 MCG tablet Take 100 mcg by mouth daily.     No facility-administered medications prior to visit.  Review of Systems  Constitutional: Positive for malaise/fatigue and weight loss. Negative for chills and fever.  HENT: Negative for hearing loss, sore throat and tinnitus.   Eyes: Negative for blurred vision and double vision.  Respiratory: Positive for cough and shortness of breath. Negative for hemoptysis, sputum production, wheezing and stridor.   Cardiovascular: Negative for chest pain, palpitations, orthopnea, leg swelling and PND.  Gastrointestinal: Negative for abdominal pain, constipation, diarrhea, heartburn, nausea and vomiting.  Genitourinary: Negative for dysuria, hematuria and urgency.  Musculoskeletal: Negative for joint pain and myalgias.  Skin: Negative for itching and rash.  Neurological: Negative for dizziness, tingling, weakness and headaches.  Endo/Heme/Allergies: Negative for environmental allergies. Does not bruise/bleed easily.  Psychiatric/Behavioral: Negative for depression. The patient is not nervous/anxious and does not have insomnia.   All other systems reviewed and are negative.    Objective:  Physical Exam Vitals reviewed.  Constitutional:      General: He is not in acute distress.    Appearance: He is well-developed.     Comments: Severe cancer related cachexia  HENT:     Head: Normocephalic and atraumatic.     Mouth/Throat:     Pharynx: No oropharyngeal exudate.  Eyes:     Conjunctiva/sclera: Conjunctivae normal.     Pupils: Pupils are equal, round, and reactive to light.  Neck:     Vascular: No JVD.      Trachea: No tracheal deviation.     Comments: Loss of supraclavicular fat Cardiovascular:     Rate and Rhythm: Normal rate and regular rhythm.     Heart sounds: S1 normal and S2 normal.     Comments: Distant heart tones Pulmonary:     Effort: No tachypnea or accessory muscle usage.     Breath sounds: No stridor. Decreased breath sounds (throughout all lung fields) present. No wheezing, rhonchi or rales.     Comments: Diminished breath sounds bilaterally Abdominal:     General: Bowel sounds are normal. There is no distension.     Palpations: Abdomen is soft.     Tenderness: There is no abdominal tenderness.  Musculoskeletal:        General: Deformity (muscle wasting ) present.  Skin:    General: Skin is warm and dry.     Capillary Refill: Capillary refill takes less than 2 seconds.     Findings: No rash.  Neurological:     Mental Status: He is alert and oriented to person, place, and time.  Psychiatric:        Behavior: Behavior normal.      Vitals:   12/13/20 1154  BP: 110/64  Pulse: 80  SpO2: 92%  Weight: 131 lb (59.4 kg)  Height: 5' 9"  (1.753 m)   92% on RA BMI Readings from Last 3 Encounters:  12/13/20 19.35 kg/m  11/09/20 20.48 kg/m  09/12/20 21.78 kg/m   Wt Readings from Last 3 Encounters:  12/13/20 131 lb (59.4 kg)  11/09/20 138 lb 10.7 oz (62.9 kg)  09/12/20 147 lb 8 oz (66.9 kg)     CBC    Component Value Date/Time   WBC 9.3 11/22/2020 0842   RBC 3.69 (L) 11/22/2020 0842   HGB 12.0 (L) 11/22/2020 0842   HCT 38.1 (L) 11/22/2020 0842   PLT 182 11/22/2020 0842   MCV 103.3 (H) 11/22/2020 0842   MCH 32.5 11/22/2020 0842   MCHC 31.5 11/22/2020 0842   RDW 20.6 (H) 11/22/2020 0842   LYMPHSABS 1.2 11/22/2020 0842   MONOABS  0.4 11/22/2020 0842   EOSABS 0.1 11/22/2020 0842   BASOSABS 0.0 11/22/2020 0842     Chest Imaging: Chest x-ray 10/25/2020: Severe COPD  CT scan of the chest on 10/15/2020: Severe COPD, emphysema, no PE The patient's  images have been independently reviewed by me.  Pulmonary Functions Testing Results: No flowsheet data found.  FeNO:   Pathology:   Echocardiogram:   Heart Catheterization:     Assessment & Plan:     ICD-10-CM   1. Primary malignant neoplasm of lung with metastasis to brain (H[facility])  C34.90    C79.31   2. Small cell lung cancer in adult [facility])  C34.90   3. Centrilobular emphysema ([place])  J43.2   4. Cancer cachexia ([place])  R64     Discussion:  This is a 70 year old gentleman, stage IV small cell carcinoma, severe cancer related cachexia undergoing treatments, severe centrilobular emphysema on CT imaging.  Plan: Will add triple therapy. Can stop Stiolto Continue triple therapy inhaler regimen to see if this improves shortness of breath. Continue albuterol for shortness of breath and wheezing. Due to his advanced disease and progressive cachexia symptoms would consider outpatient palliative care consultation. Patient can follow-up with [place] in 6 months or as needed   Current Outpatient Medications:  .  albuterol (VENTOLIN HFA) 108 (90 Base) MCG/ACT inhaler, Inhale 2 puffs into the lungs every 6 (six) hours as needed for wheezing or shortness of breath., Disp: , Rfl:  .  ALPRAZolam (XANAX) 1 MG tablet, Take 1 tablet (1 mg total) by mouth 3 (three) times daily as needed for anxiety., Disp: 30 tablet, Rfl: 0 .  Ascorbic Acid (VITAMIN C) 1000 MG tablet, Take 1,000 mg by mouth daily., Disp: , Rfl:  .  aspirin EC 81 MG tablet, Take 81 mg by mouth daily., Disp: , Rfl:  .  atorvastatin (LIPITOR) 40 MG tablet, TAKE (1) TABLET BY MOUTH ONCE DAILY. (Patient taking differently: Take 40 mg by mouth daily.), Disp: 90 tablet, Rfl: 0 .  calcium-vitamin D (OSCAL WITH D) 250-125 MG-UNIT tablet, Take 1 tablet by mouth daily., Disp: , Rfl:  .  cholecalciferol (VITAMIN D3) 25 MCG (1000 UT) tablet, Take 1,000 Units by mouth daily., Disp: , Rfl:  .  diphenoxylate-atropine (LOMOTIL) 2.5-0.025 MG/5ML liquid,  Take 5 mLs by mouth daily., Disp: 60 mL, Rfl: 0 .  docusate sodium (COLACE) 100 MG capsule, Take two capsules at bedtime starting the day you receive chemotherapy and then for four days after, Disp: 30 capsule, Rfl: 3 .  gabapentin (NEURONTIN) 300 MG capsule, TAKE (2) CAPSULES BY MOUTH THREE TIMES DAILY. (Patient taking differently: Take 600 mg by mouth 3 (three) times daily.), Disp: 180 capsule, Rfl: 6 .  ipratropium-albuterol (DUONEB) 0.5-2.5 (3) MG/3ML SOLN, Take 3 mLs by nebulization every 6 (six) hours as needed., Disp: 360 mL, Rfl:  .  loperamide (IMODIUM A-D) 2 MG tablet, Take 1 tablet (2 mg total) by mouth 4 (four) times daily as needed for diarrhea or loose stools., Disp: 30 tablet, Rfl: 3 .  metoprolol tartrate (LOPRESSOR) 25 MG tablet, Take 0.5 tablets (12.5 mg total) by mouth 2 (two) times daily., Disp: 180 tablet, Rfl: 0 .  mirtazapine (REMERON) 15 MG tablet, Take 2 tablets (30 mg total) by mouth at bedtime., Disp: 30 tablet, Rfl: 1 .  Multiple Vitamins-Minerals (CENTRUM SILVER 50+MEN) TABS, Take 1 tablet by mouth daily., Disp: , Rfl:  .  nicotine (NICODERM CQ - DOSED IN MG/24 HOURS) 14 mg/24hr patch, Place 1  patch (14 mg total) onto the skin daily., Disp: 28 patch, Rfl: 0 .  Nutritional Supplements (,FEEDING SUPPLEMENT, PROSOURCE PLUS) liquid, Take 30 mLs by mouth 2 (two) times daily between meals., Disp: , Rfl:  .  ondansetron (ZOFRAN) 8 MG tablet, Take 1 tablet (8 mg total) by mouth 2 (two) times daily as needed. Start on the third day after cisplatin chemotherapy., Disp: 30 tablet, Rfl: 1 .  oxyCODONE-acetaminophen (PERCOCET/ROXICET) 5-325 MG tablet, Take 1 tablet by mouth every 8 (eight) hours as needed for severe pain., Disp: 30 tablet, Rfl: 0 .  prochlorperazine (COMPAZINE) 10 MG tablet, Take 1 tablet (10 mg total) by mouth every 6 (six) hours as needed (Nausea or vomiting)., Disp: 60 tablet, Rfl: 1 .  senna (SENOKOT) 8.6 MG tablet, Take 3 tablets by mouth daily. , Disp: , Rfl:  .   tamsulosin (FLOMAX) 0.4 MG CAPS capsule, Take 1 capsule (0.4 mg total) by mouth daily., Disp: 30 capsule, Rfl: 3 .  Tiotropium Bromide-Olodaterol (STIOLTO RESPIMAT) 2.5-2.5 MCG/ACT AERS, Inhale 1 puff into the lungs daily., Disp: , Rfl:  .  vitamin B-12 (CYANOCOBALAMIN) 100 MCG tablet, Take 100 mcg by mouth daily., Disp: , Rfl:     Garner Nash, DO [facility] 12/13/2020 12:35 PM

## 2020-12-13 NOTE — Progress Notes (Signed)
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  As stated from his initial consultation, the patient was admitted to the hospital on 10/11/20 for hip fracture resulting from a fall. He was noted to be confused during his hospital stay. MRI received during hospital stay showed  4 brain metastasis with the largest being 3.9 cm in the cerebellar region. The patient then received a prosthetic replacement for femoral neck fracture resulting from his fall on 10/12/20, following the procedure the patient remained in inpatient care.   Since his initial consult on 10/28/20, he began urgent radiation treatment promptly after on 10/29/20. The patient finished his treatment during his hospital stay. Near the end of his treatment interval, it was noted that the patient seemed to be more alert and understood clearly his overall course of treatment. The patient was however noted to have lymph node measuring 2-1/2 x 3 cm in the upper right supraclavicular region.   The patient received an abdominal CT on 11/02/20 showing that the multiple right lower lobe pulmonary nodules were again seen and was recommended for a follow-up chest CT in 3 months.   The patient followed-up with Dr. Delton Coombes on 11/22/20 in which it was noted that the patient is now living at the [facility] on [place] as of 11/12/20. The patient reported that he is exercising regularly  (with assistance) and eating by mouth with mild difficulty while chewing his food.   He reports significant unsteadiness with standing.  He reports falling few times while at the nursing home.  He reports pain along the right leg since these falls.  Patient's aide reports that he has undergone plain x-rays to evaluate his right leg pain.  Results of the studies are not available at this time.  Patient also is having some discomfort/pain in his right supa clavicular mass.  This area has increased in size since his brain radiation treatment.  He will be seen in [place] with blood work and Dr. Delton Coombes later this week.                         Allergies:  is allergic to codeine and niaspan [niacin er].  Meds: Current Outpatient Medications  Medication Sig Dispense Refill  . albuterol (VENTOLIN HFA) 108 (90 Base) MCG/ACT inhaler Inhale 2 puffs into the lungs every 6 (six) hours as needed for wheezing or shortness of breath.    . Ascorbic Acid (VITAMIN C) 1000 MG tablet Take 1,000 mg by mouth daily.    Marland Kitchen aspirin EC 81 MG tablet Take 81 mg by mouth daily.    Marland Kitchen atorvastatin (LIPITOR) 40 MG tablet TAKE (1) TABLET BY MOUTH ONCE DAILY. (Patient taking differently: Take 40 mg by mouth daily.) 90 tablet 0  . calcium-vitamin D (OSCAL WITH D) 250-125 MG-UNIT tablet Take 1 tablet by mouth daily.    Marland Kitchen  cholecalciferol (VITAMIN D3) 25 MCG (1000 UT) tablet Take 1,000 Units by mouth daily.    . diphenoxylate-atropine (LOMOTIL) 2.5-0.025 MG/5ML liquid Take 5 mLs by mouth daily. 60 mL 0  . gabapentin (NEURONTIN) 300 MG capsule TAKE (2) CAPSULES BY MOUTH THREE TIMES DAILY. (Patient taking differently: Take 600 mg by mouth 3 (three) times daily.) 180 capsule 6  . loperamide (IMODIUM A-D) 2 MG tablet Take 1 tablet (2 mg total) by mouth 4 (four) times daily as needed for diarrhea or loose stools. 30 tablet 3  . metoprolol tartrate (LOPRESSOR) 25 MG tablet Take 0.5 tablets (12.5 mg total) by mouth 2 (two) times  daily. 180 tablet 0  . mirtazapine (REMERON) 15 MG tablet Take 2 tablets (30 mg total) by mouth at bedtime. 30 tablet 1  . Multiple Vitamins-Minerals (CENTRUM SILVER 50+MEN) TABS Take 1 tablet by mouth daily.    . Nutritional Supplements (,FEEDING SUPPLEMENT, PROSOURCE PLUS) liquid Take 30 mLs by mouth 2 (two) times daily between meals.    . ondansetron (ZOFRAN) 8 MG tablet Take 1 tablet (8 mg total) by mouth 2 (two) times daily as needed. Start on the third day after cisplatin chemotherapy. 30 tablet 1  . oxyCODONE-acetaminophen (PERCOCET/ROXICET) 5-325 MG tablet Take 1 tablet by mouth every 8 (eight) hours as needed for severe pain. 30 tablet 0  . senna (SENOKOT) 8.6 MG tablet Take 3 tablets by mouth daily.     . sertraline (ZOLOFT) 50 MG tablet Take 50 mg by mouth daily.    . tamsulosin (FLOMAX) 0.4 MG CAPS capsule Take 1 capsule (0.4 mg total) by mouth daily. 30 capsule 3  . Tiotropium Bromide-Olodaterol (STIOLTO RESPIMAT) 2.5-2.5 MCG/ACT AERS Inhale 1 puff into the lungs daily.    . vitamin B-12 (CYANOCOBALAMIN) 100 MCG tablet Take 100 mcg by mouth daily.    Marland Kitchen ALPRAZolam (XANAX) 1 MG tablet Take 1 tablet (1 mg total) by mouth 3 (three) times daily as needed for anxiety. (Patient not taking: Reported on 12/17/2020) 30 tablet 0  . docusate sodium (COLACE) 100 MG capsule Take two capsules at bedtime starting the day you receive chemotherapy and then for four days after (Patient not taking: Reported on 12/17/2020) 30 capsule 3  . ipratropium-albuterol (DUONEB) 0.5-2.5 (3) MG/3ML SOLN Take 3 mLs by nebulization every 6 (six) hours as needed. (Patient not taking: Reported on 12/17/2020) 360 mL   . nicotine (NICODERM CQ - DOSED IN MG/24 HOURS) 14 mg/24hr patch Place 1 patch (14 mg total) onto the skin daily. (Patient not taking: Reported on 12/17/2020) 28 patch 0  . prochlorperazine (COMPAZINE) 10 MG tablet Take 1 tablet (10 mg total) by mouth every 6 (six) hours as needed (Nausea or vomiting). (Patient not  taking: Reported on 12/17/2020) 60 tablet 1   No current facility-administered medications for this encounter.    Physical Findings: The patient is in no acute distress. Patient is alert and oriented.  height is 5' 9.5" (1.765 m) and weight is 131 lb 9.6 oz (59.7 kg). His temperature is 97.7 F (36.5 C). His blood pressure is 119/61 and his pulse is 66. His respiration is 20 and oxygen saturation is 97%. .   Lungs are clear to auscultation bilaterally. Heart has regular rate and rhythm. No palpable  axillary adenopathy. Abdomen soft, non-tender, normal bowel sounds. He remains in a wheelchair for the evaluation.  He is accompanied by an aide from the [facility].  Overall his confusion has improved significantly since I last saw the  patient.  Patient has a large palpable lymph node mass in the right lower neck/supraclavicular region.  This measures 4.5 x 2.75 cm.   Lab Findings: Lab Results  Component Value Date   WBC 9.3 11/22/2020   HGB 12.0 (L) 11/22/2020   HCT 38.1 (L) 11/22/2020   MCV 103.3 (H) 11/22/2020   PLT 182 11/22/2020    Radiographic Findings: XR HIP UNILAT W OR W/O PELVIS 2-3 VIEWS LEFT  Result Date: 12/04/2020 Stable left partial hip replacement without complication.   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  The patient is recovering from the effects of radiation.  We discussed consideration for repeat brain MRI to assess positive therapy.  Patient would like to discuss this with Dr. Delton Coombes later this week.  I also discussed consideration for short course of palliative radiation therapy for his enlarging right supraclavicular lymph node mass which is becoming more symptomatic at this time.  He would also like to discuss this treatment with Dr. Delton Coombes later this week in addition.  Plan: As needed follow-up in radiation oncology.  As above the patient will speak with  medical oncology about repeating a brain MRI and whether to consider palliative radiation therapy for his right supraclavicular mass.  It is difficult for the patient to come down here for radiation therapy given his overall performance status.    ____________________________________  Blair Promise, PhD, MD   This document serves as a record of services personally performed by Gery Pray, MD. It was created on his behalf by Roney Mans, a trained medical scribe. The creation of this record is based on the scribe's personal observations and the provider's statements to them. This document has been checked and approved by the attending provider.

## 2020-12-13 NOTE — Progress Notes (Incomplete)
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 The patient fractured his hip and received prosthetic replacement for femoral neck fracture on 10/12/20. He received the final doses of treatment during his hospital stay. The patient was noted to be alert near the end of his treatment interval on 11/06/20. The patient was however noted to have lymph node measuring 2-1/2 x 3 cm in the upper right supraclavicular region.   Plan: The patient will follow-up with radiation oncology in one month . ***  ________________________________________________ -----------------------------------  Blair Promise, PhD, MD   This document serves as a record of services personally performed by Gery Pray, MD. It was created on his behalf by Roney Mans, a trained medical scribe. The creation of this record is based on the scribe's personal observations and the provider's statements to them. This document has been checked and approved by the attending provider.

## 2020-12-13 NOTE — Patient Instructions (Signed)
Thank you for visiting Dr. Valeta Harms at [facility]. Today we recommend the following:  Breztri samples + spacer New prescription for this  Albuterol nebs for SOB and Wheezing  F/u with medical oncology   Return in about 6 months (around 06/14/2021) for with APP or Dr. Valeta Harms.    Please do your part to reduce the spread of COVID-19.

## 2020-12-17 ENCOUNTER — Other Ambulatory Visit: Payer: Self-pay

## 2020-12-17 ENCOUNTER — Encounter: Payer: Self-pay | Admitting: Radiation Oncology

## 2020-12-17 ENCOUNTER — Ambulatory Visit
Admission: RE | Admit: 2020-12-17 | Discharge: 2020-12-17 | Disposition: A | Discharge status: Home / Self Care | Payer: Medicare HMO | Source: Ambulatory Visit | Attending: Radiation Oncology | Admitting: Radiation Oncology

## 2020-12-17 VITALS — BP 119/61 | HR 66 | Temp 97.7°F | Resp 20 | Ht 69.5 in | Wt 131.6 lb

## 2020-12-17 DIAGNOSIS — C801 Malignant (primary) neoplasm, unspecified: Secondary | ICD-10-CM | POA: Insufficient documentation

## 2020-12-17 DIAGNOSIS — Z7982 Long term (current) use of aspirin: Secondary | ICD-10-CM | POA: Insufficient documentation

## 2020-12-17 DIAGNOSIS — Z79899 Other long term (current) drug therapy: Secondary | ICD-10-CM | POA: Diagnosis not present

## 2020-12-17 DIAGNOSIS — R2681 Unsteadiness on feet: Secondary | ICD-10-CM | POA: Diagnosis not present

## 2020-12-17 DIAGNOSIS — Z923 Personal history of irradiation: Secondary | ICD-10-CM | POA: Diagnosis not present

## 2020-12-17 DIAGNOSIS — C7931 Secondary malignant neoplasm of brain: Secondary | ICD-10-CM | POA: Diagnosis not present

## 2020-12-17 DIAGNOSIS — Z9181 History of falling: Secondary | ICD-10-CM | POA: Insufficient documentation

## 2020-12-17 DIAGNOSIS — R410 Disorientation, unspecified: Secondary | ICD-10-CM | POA: Diagnosis not present

## 2020-12-17 DIAGNOSIS — C3490 Malignant neoplasm of unspecified part of unspecified bronchus or lung: Secondary | ICD-10-CM

## 2020-12-17 NOTE — Progress Notes (Signed)
[name] is here today for follow up post radiation to the brain.    They completed their radiation on: 11/08/2020  Does the patient complain of any of the following: . Headache: denies . Visual Changes: denies . Hearing Changes: denies . Nausea: denies . Vomiting: denies . Balance or coordination issues: yes, very unsteady gait . Memory issues: mild short term memory loss  Is the patient currently on a Decadron regimen? : no  Additional comments if applicable: none   Vitals:   12/17/20 1045  BP: 119/61  Pulse: 66  Resp: 20  Temp: 97.7 F (36.5 C)  SpO2: 97%  Weight: 131 lb 9.6 oz (59.7 kg)  Height: 5' 9.5" (1.765 m)

## 2020-12-18 ENCOUNTER — Inpatient Hospital Stay (HOSPITAL_COMMUNITY)

## 2020-12-18 ENCOUNTER — Encounter (HOSPITAL_COMMUNITY): Payer: Self-pay | Admitting: Hematology

## 2020-12-18 ENCOUNTER — Inpatient Hospital Stay (HOSPITAL_COMMUNITY): Attending: Hematology and Oncology | Admitting: Hematology and Oncology

## 2020-12-18 VITALS — BP 122/61 | HR 88 | Temp 98.0°F | Resp 18

## 2020-12-18 DIAGNOSIS — D649 Anemia, unspecified: Secondary | ICD-10-CM | POA: Diagnosis not present

## 2020-12-18 DIAGNOSIS — F419 Anxiety disorder, unspecified: Secondary | ICD-10-CM | POA: Diagnosis not present

## 2020-12-18 DIAGNOSIS — C770 Secondary and unspecified malignant neoplasm of lymph nodes of head, face and neck: Secondary | ICD-10-CM | POA: Diagnosis not present

## 2020-12-18 DIAGNOSIS — T451X5A Adverse effect of antineoplastic and immunosuppressive drugs, initial encounter: Secondary | ICD-10-CM | POA: Diagnosis not present

## 2020-12-18 DIAGNOSIS — E785 Hyperlipidemia, unspecified: Secondary | ICD-10-CM | POA: Diagnosis not present

## 2020-12-18 DIAGNOSIS — C7931 Secondary malignant neoplasm of brain: Secondary | ICD-10-CM | POA: Diagnosis not present

## 2020-12-18 DIAGNOSIS — G620 Drug-induced polyneuropathy: Secondary | ICD-10-CM

## 2020-12-18 DIAGNOSIS — I2510 Atherosclerotic heart disease of native coronary artery without angina pectoris: Secondary | ICD-10-CM | POA: Diagnosis not present

## 2020-12-18 DIAGNOSIS — J449 Chronic obstructive pulmonary disease, unspecified: Secondary | ICD-10-CM | POA: Diagnosis not present

## 2020-12-18 DIAGNOSIS — I10 Essential (primary) hypertension: Secondary | ICD-10-CM | POA: Diagnosis not present

## 2020-12-18 DIAGNOSIS — E46 Unspecified protein-calorie malnutrition: Secondary | ICD-10-CM | POA: Diagnosis not present

## 2020-12-18 DIAGNOSIS — C787 Secondary malignant neoplasm of liver and intrahepatic bile duct: Secondary | ICD-10-CM | POA: Insufficient documentation

## 2020-12-18 DIAGNOSIS — C3490 Malignant neoplasm of unspecified part of unspecified bronchus or lung: Secondary | ICD-10-CM | POA: Diagnosis not present

## 2020-12-18 LAB — COMPREHENSIVE METABOLIC PANEL
ALT: 17 U/L (ref 0–44)
AST: 18 U/L (ref 15–41)
Albumin: 3.7 g/dL (ref 3.5–5.0)
Alkaline Phosphatase: 72 U/L (ref 38–126)
Anion gap: 8 (ref 5–15)
BUN: 27 mg/dL — ABNORMAL HIGH (ref 8–23)
CO2: 29 mmol/L (ref 22–32)
Calcium: 9.7 mg/dL (ref 8.9–10.3)
Chloride: 102 mmol/L (ref 98–111)
Creatinine, Ser: 0.63 mg/dL (ref 0.61–1.24)
GFR, Estimated: >60 mL/min (ref >60)
Glucose, Bld: 112 mg/dL — ABNORMAL HIGH (ref 70–99)
Potassium: 4.2 mmol/L (ref 3.5–5.1)
Sodium: 139 mmol/L (ref 135–145)
Total Bilirubin: 0.8 mg/dL (ref 0.3–1.2)
Total Protein: 7.9 g/dL (ref 6.5–8.1)

## 2020-12-18 LAB — CBC WITH DIFFERENTIAL/PLATELET
Abs Immature Granulocytes: 0.06 10*3/uL (ref 0.00–0.07)
Basophils Absolute: 0 10*3/uL (ref 0.0–0.1)
Basophils Relative: 1 %
Eosinophils Absolute: 0.1 10*3/uL (ref 0.0–0.5)
Eosinophils Relative: 2 %
HCT: 37.8 % — ABNORMAL LOW (ref 39.0–52.0)
Hemoglobin: 12 g/dL — ABNORMAL LOW (ref 13.0–17.0)
Immature Granulocytes: 1 %
Lymphocytes Relative: 9 %
Lymphs Abs: 0.6 10*3/uL — ABNORMAL LOW (ref 0.7–4.0)
MCH: 30.7 pg (ref 26.0–34.0)
MCHC: 31.7 g/dL (ref 30.0–36.0)
MCV: 96.7 fL (ref 80.0–100.0)
Monocytes Absolute: 0.8 10*3/uL (ref 0.1–1.0)
Monocytes Relative: 11 %
Neutro Abs: 5 10*3/uL (ref 1.7–7.7)
Neutrophils Relative %: 76 %
Platelets: 285 10*3/uL (ref 150–400)
RBC: 3.91 MIL/uL — ABNORMAL LOW (ref 4.22–5.81)
RDW: 17.5 % — ABNORMAL HIGH (ref 11.5–15.5)
WBC: 6.6 10*3/uL (ref 4.0–10.5)
nRBC: 0 % (ref 0.0–0.2)

## 2020-12-18 LAB — MAGNESIUM: Magnesium: 2 mg/dL (ref 1.7–2.4)

## 2020-12-18 NOTE — Progress Notes (Signed)
Per patient at 12/18/20 check out, he did not wish to return for any follow ups at [facility].  Stated he had decided to go with Hospice care.  Referral was sent from [facility] according to their 12/11/20 Epic Telephone encounter note.

## 2020-12-18 NOTE — Progress Notes (Signed)
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 Bulky 5.4 cm right supraclavicular region malignant lymph node conglomeration with extracapsular extension. 2. Surrounding smaller abnormal right level 3 and level 5 lymph nodes, and the lymphadenopathy continues into the superior mediastinum, see Chest CT findings reported separately. 3. No other metastatic disease identified in the neck.   01/16/2017 Imaging   CT chest: IMPRESSION: 1. Extensive lymphadenopathy in the thorax and lower right cervical region, as discussed above. Primary differential considerations include lymphoma/leukemia or small cell carcinoma of the lung. Further evaluation a PET-CT could be considered to assess for additional sites of disease below the diaphragm if clinically appropriate. Additionally, ultrasound-guided biopsy of supraclavicular lymphadenopathy could be considered to establish a tissue diagnosis. 2. Indeterminate lesion in the periphery of segment 8 of the liver measuring 2.7 x 1.7 cm. Attention at time of follow-up PET-CT is recommended. 3. Aortic atherosclerosis, in addition to left main and 3 vessel coronary artery disease. Please note that although the presence of coronary artery calcium documents the presence of coronary artery disease, the severity of this disease and any potential stenosis cannot be assessed on this non-gated CT examination. Assessment for potential risk factor modification, dietary therapy or  pharmacologic therapy may be warranted, if clinically indicated. 4. There are calcifications of the aortic valve. Echocardiographic correlation for evaluation of potential valvular dysfunction may be warranted if clinically indicated. 5. Diffuse bronchial wall thickening with moderate centrilobular and paraseptal emphysema; imaging findings suggestive of underlying COPD.   02/03/2017 Initial Biopsy   (R) neck lymph node biopsy: SMALL CELL CARCINOMA (most likely lung primary).    02/03/2017 Miscellaneous   Port-a-cath attempted by IR; unable to place d/t enlarged SVC.    02/05/2017 Initial Diagnosis   Extensive stage primary small cell carcinoma of lung ([place])   02/09/2017 - 05/27/2017 Chemotherapy   6 cycles of cisplatin+etoposide    02/11/2017 Imaging   MRI brain: CLINICAL DATA:  Advanced stage small cell lung cancer. Staging for metastatic disease  EXAM: MRI HEAD WITHOUT AND WITH CONTRAST  TECHNIQUE: Multiplanar, multiecho pulse sequences of the brain and surrounding structures were obtained without and with intravenous contrast.  CONTRAST:  27m MULTIHANCE GADOBENATE DIMEGLUMINE 529 MG/ML IV SOLN  COMPARISON:  None.  FINDINGS: Brain: Negative for hydrocephalus. Cerebral volume normal for age. Small nonenhancing white matter hyperintensities consistent with mild chronic microvascular ischemia. No acute infarct. Negative for hemorrhage or mass or edema  Normal enhancement postcontrast infusion. No enhancing mass lesion. Leptomeningeal enhancement is normal.  Vascular: Normal arterial flow voids.  Normal venous enhancement  Skull and upper cervical spine: Negative  Sinuses/Orbits: Negative  Other: None  IMPRESSION: Negative for metastatic disease.  No acute abnormality.  Mild chronic white matter changes.   04/07/2017 Imaging    PET:  1. Marked reduction in size and metabolic activity of bulky RIGHT supraclavicular adenopathy mediastinal  lymphadenopathy. 2. Residual moderate activity remains within small RIGHT supraclavicular lymph node, RIGHT lower paratracheal lymph node and RIGHT hilar lymph  node. 3. Resolution of prevascular and internal mammary mediastinal metastatic hypermetabolic activity. 4. Resolution of metabolic activity associated with solitary RIGHT hepatic lobe liver metastasis. 5. No evidence of disease progression. 6. 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   05/27/2017 Imaging   MRI brain w/ and w/o contrast IMPRESSION: 1. No metastatic disease identified. 2. Increased nonspecific cerebral white matter signal changes since August. These are most commonly small vessel disease related. 3. New right maxillary sinusitis. Benign appearing retention cysts in the nasopharynx with trace mastoid effusions.   06/12/2017 Imaging   PET-CT IMPRESSION: 1. There are two new hypermetabolic nodules identified within both lower lobes measuring up to 3.1 cm. The appearance is nonspecific and may be inflammatory/infectious in etiology. Pulmonary metastatic disease cannot be excluded and short-term follow-up imaging in 3 months is advised to reassess these nodules. 2. Stable appearance of mild hypermetabolic activity associated with right paratracheal and right hilar lymph nodes. 3. Decrease in FDG uptake associated with index right supraclavicular lymph node. 4. 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 5. Aortic Atherosclerosis (ICD10-I70.0) and Emphysema (ICD10-J43.9).   08/24/2017 - 11/18/2019 Chemotherapy   The patient had pegfilgrastim (NEULASTA ONPRO KIT) injection 6 mg, 6 mg, Subcutaneous, Once, 30 of 31 cycles Administration: 6 mg (08/28/2017), 6 mg (09/18/2017), 6 mg (10/09/2017), 6 mg (11/02/2017), 6 mg (11/20/2017), 6  mg (12/18/2017), 6 mg (01/15/2018), 6 mg (02/12/2018), 6 mg (03/12/2018), 6 mg (04/09/2018), 6 mg (05/07/2018), 6 mg (06/04/2018), 6 mg (07/02/2018), 6 mg (07/30/2018), 6 mg (08/27/2018), 6 mg (09/24/2018), 6 mg (10/29/2018), 6 mg (11/26/2018), 6 mg (12/24/2018), 6 mg (01/21/2019), 6 mg (02/18/2019), 6 mg (03/18/2019), 6 mg (04/15/2019), 6 mg (05/13/2019), 6 mg (06/17/2019), 6 mg (07/22/2019), 6 mg (08/19/2019), 6 mg (09/16/2019), 6 mg (10/21/2019), 6 mg (11/18/2019) topotecan (HYCAMTIN) 2.9 mg in sodium chloride 0.9 % 100 mL chemo infusion, 1.5 mg/m2 = 2.9 mg, Intravenous,  Once, 30 of 31 cycles Dose modification: 1.2 mg/m2 (80 % of original dose 1.5 mg/m2, Cycle 4, Reason: Dose Not Tolerated) Administration: 2.9 mg (08/24/2017), 2.9 mg (08/25/2017), 2.9 mg (08/28/2017), 2.9 mg (08/26/2017), 2.9 mg (08/27/2017), 2.9 mg (09/14/2017), 2.9 mg (09/15/2017), 2.9 mg (09/16/2017), 2.9 mg (09/17/2017), 2.9 mg (09/18/2017), 2.9 mg (10/05/2017), 2.9 mg (10/06/2017), 2.9 mg (10/07/2017), 2.9 mg (10/08/2017), 2.9 mg (10/09/2017), 2.3 mg (10/26/2017), 2.3 mg (10/27/2017), 2.3 mg (10/28/2017), 2.3 mg (10/29/2017), 2.3 mg (11/02/2017), 2.3 mg (11/16/2017), 2.3 mg (11/17/2017), 2.3 mg (11/18/2017), 2.3 mg (11/19/2017), 2.3 mg (11/20/2017), 2.3 mg (12/14/2017), 2.3 mg (12/15/2017), 2.3 mg (12/16/2017), 2.3 mg (12/17/2017), 2.3 mg (12/18/2017), 2.3 mg (01/11/2018), 2.3 mg (01/12/2018), 2.3 mg (01/13/2018), 2.3 mg (01/15/2018), 2.3 mg (02/08/2018), 2.3 mg (02/09/2018), 2.3 mg (02/10/2018), 2.3 mg (02/11/2018), 2.3 mg (02/12/2018), 2.3 mg (03/08/2018), 2.3 mg (03/09/2018), 2.3 mg (03/10/2018), 2.3 mg (03/11/2018), 2.3 mg (03/12/2018), 2.2 mg (04/05/2018), 2.2 mg (04/06/2018), 2.2 mg (04/07/2018), 2.2 mg (04/08/2018), 2.2 mg (04/09/2018), 2.2 mg (05/03/2018), 2.2 mg (05/04/2018), 2.2 mg (05/05/2018), 2.2 mg (05/06/2018), 2.2 mg (05/07/2018), 2.2 mg (05/31/2018), 2.2 mg (06/01/2018), 2.2 mg (06/02/2018), 2.2 mg (06/03/2018), 2.2 mg (06/04/2018), 2.2 mg (06/28/2018), 2.2 mg (06/29/2018), 2.2 mg (06/30/2018), 2.2 mg (07/01/2018), 2.2  mg (07/02/2018), 2.2 mg (07/26/2018), 2.2 mg (07/27/2018), 2.2 mg (07/28/2018), 2.2 mg (07/29/2018), 2.2 mg (07/30/2018), 2.2 mg (08/23/2018), 2.2 mg (08/24/2018), 2.2 mg (08/25/2018), 2.2 mg (08/26/2018), 2.2 mg (08/27/2018), 2.2 mg (09/20/2018),  2.2 mg (09/21/2018), 2.2 mg (09/22/2018), 2.2 mg (09/23/2018), 2.2 mg (09/24/2018), 2.2 mg (10/25/2018), 2.2 mg (10/26/2018), 2.2 mg (10/27/2018), 2.2 mg (10/28/2018), 2.2 mg (10/29/2018), 2.2 mg (11/22/2018), 2.2 mg (11/23/2018), 2.2 mg (11/24/2018), 2.2 mg (11/25/2018), 2.2 mg (11/26/2018), 2.2 mg (12/20/2018), 2.2 mg (12/21/2018), 2.2 mg (12/22/2018), 2.2 mg (12/23/2018), 2.2 mg (12/24/2018), 2.2 mg (01/17/2019), 2.2 mg (01/18/2019), 2.2 mg (01/19/2019), 2.2 mg (01/20/2019), 2.2 mg (01/21/2019), 2.2 mg (02/14/2019), 2.2 mg (02/15/2019), 2.2 mg (02/16/2019), 2.2 mg (02/17/2019), 2.2 mg (02/18/2019), 2.2 mg (03/14/2019), 2.2 mg (03/15/2019), 2.2 mg (03/16/2019), 2.2 mg (03/17/2019), 2.2 mg (03/18/2019), 2.2 mg (04/11/2019), 2.2 mg (04/12/2019), 2.2 mg (04/13/2019), 2.2 mg (04/14/2019), 2.2 mg (04/15/2019), 2.2 mg (05/09/2019), 2.2 mg (05/10/2019), 2.2 mg (05/11/2019), 2.2 mg (05/12/2019), 2.2 mg (05/13/2019), 2.2 mg (06/13/2019), 2.2 mg (06/14/2019), 2.2 mg (06/15/2019), 2.2 mg (06/16/2019), 2.2 mg (06/17/2019), 2.2 mg (07/18/2019), 2.2 mg (07/19/2019), 2.2 mg (07/20/2019), 2.2 mg (07/21/2019), 2.2 mg (07/22/2019), 2.2 mg (08/15/2019), 2.2 mg (08/16/2019), 2.2 mg (08/17/2019), 2.2 mg (08/18/2019), 2.2 mg (08/19/2019), 2.2 mg (09/12/2019), 2.2 mg (09/13/2019), 2.2 mg (09/14/2019), 2.2 mg (09/15/2019), 2.2 mg (09/16/2019), 2.2 mg (10/17/2019), 2.2 mg (10/18/2019), 2.2 mg (10/19/2019), 2.2 mg (10/20/2019), 2.2 mg (10/21/2019), 2.2 mg (11/14/2019), 2.2 mg (11/15/2019), 2.2 mg (11/16/2019), 2.2 mg (11/17/2019), 2.2 mg (11/18/2019) ondansetron (ZOFRAN) 4 mg in sodium chloride 0.9 % 50 mL IVPB, , Intravenous,  Once, 25 of 26 cycles Administration:  (02/08/2018),  (02/09/2018),  (02/10/2018),  (02/11/2018),  (02/12/2018),  (03/08/2018),  (03/09/2018),  (03/10/2018),  (03/11/2018),  (03/12/2018),   (04/05/2018),  (04/06/2018),  (04/07/2018),  (04/08/2018),  (04/09/2018),  (05/03/2018),  (05/04/2018),  (05/05/2018),  (05/06/2018),  (05/07/2018),  (05/31/2018),  (06/01/2018),  (06/02/2018),  (06/03/2018),  (06/04/2018),  (06/28/2018),  (06/29/2018),  (06/30/2018),  (07/01/2018),  (07/02/2018),  (07/26/2018),  (07/27/2018),  (07/28/2018),  (07/29/2018),  (07/30/2018),  (08/23/2018),  (08/24/2018),  (08/25/2018),  (08/26/2018),  (08/27/2018),  (09/20/2018),  (09/21/2018),  (09/22/2018),  (09/23/2018),  (09/24/2018),  (10/25/2018),  (10/26/2018),  (10/27/2018),  (10/28/2018),  (10/29/2018),  (11/22/2018),  (11/23/2018),  (11/24/2018),  (11/25/2018),  (11/26/2018),  (12/20/2018),  (12/21/2018),  (12/22/2018),  (12/23/2018),  (12/24/2018),  (01/17/2019),  (01/18/2019),  (01/19/2019),  (01/20/2019),  (01/21/2019),  (02/14/2019),  (02/15/2019),  (02/16/2019),  (02/17/2019),  (02/18/2019),  (03/14/2019),  (03/15/2019),  (03/16/2019),  (03/17/2019),  (03/18/2019),  (04/11/2019),  (04/12/2019),  (04/13/2019),  (04/14/2019),  (04/15/2019),  (05/09/2019),  (05/10/2019),  (05/11/2019),  (05/12/2019),  (05/13/2019),  (06/13/2019),  (06/14/2019),  (06/15/2019),  (06/16/2019),  (06/17/2019),  (07/18/2019),  (07/19/2019),  (07/20/2019),  (07/21/2019),  (07/22/2019),  (08/15/2019),  (08/16/2019),  (08/17/2019),  (08/18/2019),  (08/19/2019),  (09/12/2019),  (09/13/2019),  (09/14/2019),  (09/15/2019),  (09/16/2019),  (10/17/2019),  (10/18/2019),  (10/19/2019),  (10/20/2019),  (10/21/2019),  (11/14/2019),  (11/15/2019),  (11/16/2019),  (11/17/2019),  (11/18/2019)  for chemotherapy treatment.    12/22/2019 -  Chemotherapy    Patient is on Treatment Plan: LUNG SMALL CELL LURBINECTEDIN Q21D        CANCER STAGING: Cancer Staging No matching staging information was found for the patient.  INTERVAL HISTORY:  Mr. [name], a 70 y.o. 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 Egan was last seen on 5/12//2022.   On exam today Mr. Salmons is accompanied by his brother in person and his sister by  phone.  Lymph node in his neck has increased markedly in size per the patient.  It appears to be about the size of a golf ball now.  He notes that  he is currently taking Percocet and alprazolam.  He has [facility] care with [facility], though he is currently in a skilled nursing facility.  He continues to have worsening weakness and is unable to rise from his chair.  He reports that he is wearing diapers and does occasionally have episodes of diarrhea.  After detailed discussion with the patient, his sister, and his brother they were in agreement that comfort based measures at this point were most appropriate.  I do not believe the patient will have any significant benefit from additional chemotherapy or radiation.  Strongly encouraged him to focus entirely on his symptoms.  The family and the patient were in agreement.   REVIEW OF SYSTEMS:  Review of Systems  Constitutional: Positive for fatigue (50%). Negative for appetite change.  HENT:   Positive for trouble swallowing (trouble chewing).   Respiratory: Positive for cough.   Gastrointestinal: Positive for constipation and diarrhea.  Genitourinary: Positive for difficulty urinating (urgency).   Musculoskeletal: Positive for back pain (low back 6/10).  Neurological: Positive for dizziness and numbness (numb and burning in feet).    PAST MEDICAL/SURGICAL HISTORY:  Past Medical History:  Diagnosis Date  . Anxiety   . CAD (coronary artery disease)   . Cancer ([place])    stage 4 small cell lung cancer   . COPD (chronic obstructive pulmonary disease) ([place])   . Depression   . Dyspnea    increased exertion  . Feeling of chest tightness   . Heart palpitations   . History of chemotherapy   . History of radiation therapy 10/29/2020-11/08/2020   whole brain    Dr Gery Pray  . Myocardial infarction ([place])   . Osteopenia   . Panic attacks   . Smoker    Past Surgical History:  Procedure Laterality Date  . BACK SURGERY  12/24/2000    L5,S1  . CORONARY STENT PLACEMENT  2005   RCA & CX  . HERNIA REPAIR Right 1980's  . INGUINAL HERNIA REPAIR  12/1978   right side  . IR FLUORO GUIDE PORT INSERTION RIGHT  04/02/2017  . IR US GUIDE BX ASP/DRAIN  02/03/2017  . IR US GUIDE VASC ACCESS RIGHT  04/02/2017  . NM MYOCAR PERF WALL MOTION  09/07/2009   No ischemia; EF 51%  . SHOULDER SURGERY Left 08/2010  . SPINE SURGERY  2002   L5-S1  . TOTAL HIP ARTHROPLASTY Left 10/12/2020   Procedure: TOTALhemi  HIP ARTHROPLASTY ANTERIOR APPROACH;  Surgeon: [name], MD;  Location: [facility];  Service: Orthopedics;  Laterality: Left;    SOCIAL HISTORY:  Social History   Socioeconomic History  . Marital status: Single    Spouse name: Not on file  . Number of children: Not on file  . Years of education: Not on file  . Highest education level: Not on file  Occupational History  . Not on file  Tobacco Use  . Smoking status: Current Every Day Smoker    Packs/day: 1.00    Types: Cigarettes  . Smokeless tobacco: Never Used  Vaping Use  . Vaping Use: Never used  Substance and Sexual Activity  . Alcohol use: Yes    Comment: occas  . Drug use: No  . Sexual activity: Not on file  Other Topics Concern  . Not on file  Social History Narrative  . Not on file   Social Determinants of Health   Financial Resource Strain: Low Risk   . Difficulty of Paying Living Expenses:  Not hard at all  Food Insecurity: No Food Insecurity  . Worried About Charity fundraiser in the Last Year: Never true  . Ran Out of Food in the Last Year: Never true  Transportation Needs: No Transportation Needs  . Lack of Transportation (Medical): No  . Lack of Transportation (Non-Medical): No  Physical Activity: Inactive  . Days of Exercise per Week: 0 days  . Minutes of Exercise per Session: 0 min  Stress: No Stress Concern Present  . Feeling of Stress : Not at all  Social Connections: Socially Isolated  . Frequency of Communication with Friends and Family:  More than three times a week  . Frequency of Social Gatherings with Friends and Family: More than three times a week  . Attends Religious Services: Never  . Active Member of Clubs or Organizations: No  . Attends Archivist Meetings: Never  . Marital Status: Never married  Intimate Partner Violence: Not At Risk  . Fear of Current or Ex-Partner: No  . Emotionally Abused: No  . Physically Abused: No  . Sexually Abused: No    FAMILY HISTORY:  Family History  Problem Relation Age of Onset  . Heart attack Father   . Kidney disease Father        renal failure  . Heart failure Mother   . Heart attack Mother   . Cancer Brother   . Diabetes Brother   . Alcohol abuse Brother   . Diabetes Sister     CURRENT MEDICATIONS:  Current Outpatient Medications  Medication Sig Dispense Refill  . albuterol (VENTOLIN HFA) 108 (90 Base) MCG/ACT inhaler Inhale 2 puffs into the lungs every 6 (six) hours as needed for wheezing or shortness of breath.    . ALPRAZolam (XANAX) 1 MG tablet Take 1 tablet (1 mg total) by mouth 3 (three) times daily as needed for anxiety. 30 tablet 0  . Ascorbic Acid (VITAMIN C) 1000 MG tablet Take 1,000 mg by mouth daily.    Marland Kitchen aspirin EC 81 MG tablet Take 81 mg by mouth daily.    Marland Kitchen atorvastatin (LIPITOR) 40 MG tablet TAKE (1) TABLET BY MOUTH ONCE DAILY. (Patient taking differently: Take 40 mg by mouth daily.) 90 tablet 0  . calcium-vitamin D (OSCAL WITH D) 250-125 MG-UNIT tablet Take 1 tablet by mouth daily.    . cholecalciferol (VITAMIN D3) 25 MCG (1000 UT) tablet Take 1,000 Units by mouth daily.    . diphenoxylate-atropine (LOMOTIL) 2.5-0.025 MG/5ML liquid Take 5 mLs by mouth daily. 60 mL 0  . docusate sodium (COLACE) 100 MG capsule Take two capsules at bedtime starting the day you receive chemotherapy and then for four days after 30 capsule 3  . ipratropium-albuterol (DUONEB) 0.5-2.5 (3) MG/3ML SOLN Take 3 mLs by nebulization every 6 (six) hours as needed. 360 mL    . loperamide (IMODIUM A-D) 2 MG tablet Take 1 tablet (2 mg total) by mouth 4 (four) times daily as needed for diarrhea or loose stools. 30 tablet 3  . metoprolol tartrate (LOPRESSOR) 25 MG tablet Take 0.5 tablets (12.5 mg total) by mouth 2 (two) times daily. 180 tablet 0  . mirtazapine (REMERON) 15 MG tablet Take 2 tablets (30 mg total) by mouth at bedtime. 30 tablet 1  . Multiple Vitamins-Minerals (CENTRUM SILVER 50+MEN) TABS Take 1 tablet by mouth daily.    . nicotine (NICODERM CQ - DOSED IN MG/24 HOURS) 14 mg/24hr patch Place 1 patch (14 mg total) onto the skin daily. [place]  patch 0  . Nutritional Supplements (,FEEDING SUPPLEMENT, PROSOURCE PLUS) liquid Take 30 mLs by mouth 2 (two) times daily between meals.    . ondansetron (ZOFRAN) 8 MG tablet Take 1 tablet (8 mg total) by mouth 2 (two) times daily as needed. Start on the third day after cisplatin chemotherapy. 30 tablet 1  . oxyCODONE-acetaminophen (PERCOCET/ROXICET) 5-325 MG tablet Take 1 tablet by mouth every 8 (eight) hours as needed for severe pain. 30 tablet 0  . prochlorperazine (COMPAZINE) 10 MG tablet Take 1 tablet (10 mg total) by mouth every 6 (six) hours as needed (Nausea or vomiting). 60 tablet 1  . senna (SENOKOT) 8.6 MG tablet Take 3 tablets by mouth daily.     . sertraline (ZOLOFT) 50 MG tablet Take 50 mg by mouth daily.    . tamsulosin (FLOMAX) 0.4 MG CAPS capsule Take 1 capsule (0.4 mg total) by mouth daily. 30 capsule 3  . Tiotropium Bromide-Olodaterol (STIOLTO RESPIMAT) 2.5-2.5 MCG/ACT AERS Inhale 1 puff into the lungs daily.    . vitamin B-12 (CYANOCOBALAMIN) 100 MCG tablet Take 100 mcg by mouth daily.    Marland Kitchen gabapentin (NEURONTIN) 300 MG capsule TAKE (2) CAPSULES BY MOUTH THREE TIMES DAILY. (Patient not taking: Reported on 12/18/2020) 180 capsule 6   No current facility-administered medications for this visit.    ALLERGIES:  Allergies  Allergen Reactions  . Codeine Nausea Only  . Niaspan [Niacin Er]     PHYSICAL EXAM:   Performance status (ECOG): 1 - Symptomatic but completely ambulatory  Vitals:   12/18/20 1151  BP: 122/61  Pulse: 88  Resp: 18  Temp: 98 F (36.7 C)  SpO2: 97%   Wt Readings from Last 3 Encounters:  12/17/20 131 lb 9.6 oz (59.7 kg)  12/13/20 131 lb (59.4 kg)  11/09/20 138 lb 10.7 oz (62.9 kg)   Physical Exam Vitals reviewed.  Constitutional:      Appearance: Normal appearance.  Cardiovascular:     Rate and Rhythm: Normal rate and regular rhythm.     Pulses: Normal pulses.     Heart sounds: Normal heart sounds.  Pulmonary:     Effort: Pulmonary effort is normal.     Breath sounds: Normal breath sounds.  Chest:  Breasts:     Right: Supraclavicular adenopathy present.    Abdominal:     Palpations: Abdomen is soft. There is no hepatomegaly, splenomegaly or mass.     Tenderness: There is no abdominal tenderness.  Musculoskeletal:     Right lower leg: No edema.     Left lower leg: No edema.  Lymphadenopathy:     Cervical: Cervical adenopathy present.     Right cervical: Superficial cervical adenopathy present.     Upper Body:     Right upper body: Supraclavicular adenopathy present.  Neurological:     General: No focal deficit present.     Mental Status: He is alert and oriented to person, place, and time.  Psychiatric:        Mood and Affect: Mood normal.        Behavior: Behavior normal.      LABORATORY DATA:  I have reviewed the labs as listed.  CBC Latest Ref Rng & Units 12/18/2020 11/22/2020 11/07/2020  WBC 4.0 - 10.5 K/uL 6.6 9.3 11.2(H)  Hemoglobin 13.0 - 17.0 g/dL 12.0(L) 12.0(L) 9.5(L)  Hematocrit 39.0 - 52.0 % 37.8(L) 38.1(L) 29.8(L)  Platelets 150 - 400 K/uL 285 182 188   CMP Latest Ref Rng & Units 11/22/2020 11/07/2020  11/06/2020  Glucose 70 - 99 mg/dL 106(H) 145(H) 125(H)  BUN 8 - 23 mg/dL 32(H) 42(H) 31(H)  Creatinine 0.61 - 1.24 mg/dL 0.81 0.81 0.75  Sodium 135 - 145 mmol/L 140 139 136  Potassium 3.5 - 5.1 mmol/L 4.1 4.1 4.2  Chloride 98 - 111  mmol/L 104 108 104  CO2 22 - 32 mmol/L _0 Calcium 8.9 - 10.3 mg/dL 9.4 8.7(L) 8.5(L)  Total Protein 6.5 - 8.1 g/dL 6.6 4.9(L) 5.4(L)  Total Bilirubin 0.3 - 1.2 mg/dL 0.7 0.3 0.4  Alkaline Phos 38 - 126 U/L 84 74 61  AST 15 - 41 U/L 20 18 12(L)  ALT 0 - 44 U/L 32 25 17    DIAGNOSTIC IMAGING:  I have independently reviewed the scans and discussed with the patient. XR HIP UNILAT W OR W/O PELVIS 2-3 VIEWS LEFT  Result Date: 12/04/2020 Stable left partial hip replacement without complication.    ASSESSMENT:  1. 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 -CT CAP on 12/15/2019 showed enlarging prevascular and right paratracheal lymph nodes. Stable left lower lobe lung nodules. -CT neck on 12/15/2019 showed malignant lymphadenopathy in the lower right posterior triangle and jugular chain. -Lurbinectidin 3.41m/kgstarted on 12/22/2019. -CT neck on 02/23/2020 showed marked reduction of lower right neck and right supraclavicular adenopathy. -CT CAP on 02/23/2020 showed treatment response with resolved mediastinal adenopathy. No evidence of new or progressive metastatic disease. Scattered bilateral pulmonary nodules. -CT CAP from 05/17/2020 with no evidence of recurrence or metastatic disease. Stable irregular pulmonary nodules bilaterally. -CT neck from 05/17/2020 showed unchanged 2.1 x 1.4 cm peripherally enhancing hypodense lesion within the left nasopharyngeal region, favored to be a necrotic lymph node. No pathologically enlarged lymph nodes in the neck.   PLAN:  1. 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 - He was admitted to the hospital from 10/10/2020 through 11/10/2020 when he was found unresponsive at home.  He was diagnosed with left femoral neck fracture, facial and L3 transverse process fractures, encephalopathy and rhabdomyolysis, acute kidney injury.  Also  required intubation from 10/11/2020-10/11/2020 - 10/17/2020. - We have reviewed scans during hospitalization.  No other progression other than slight increase in the right neck lymph nodes and new brain metastasis. - [facility] [place]   - He has refused PEG tube placement.  He reports that he is eating very well and is also drinking about 2 cans of boost per day. - He reported diarrhea.  Likely from boost.  I have told him to hold boost and see if it helps. - He is now bedbound and unable to walk/stand.  He has enrolled in hospice with [facility] today which showed hemoglobin 12.  Renal function has normalized.  LFTs are normal. - Physical examination showed large right neck lymph node. --patient transitioning to hospice --6 week visit scheduled for symptom evaluation, patient can d/c this visit if he does not wish to come to the hospital that day.   2. Normocytic anemia: -This is from myelosuppression.  Hemoglobin improved to 12.  3. Chronic pain: -Continue Percocet as needed.  4. Peripheral neuropathy: -Continue gabapentin 600 mg 3 times a day.  5.  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 - Whole brain radiation therapy was given starting 10/28/2020.   Orders placed  this encounter:  No orders of the defined types were placed in this encounter.  Ledell Peoples, MD Department of Hematology/Oncology [place] at [facility] Phone: [phone] Pager: [phone] Email: [email or web address]

## 2020-12-26 DIAGNOSIS — C3490 Malignant neoplasm of unspecified part of unspecified bronchus or lung: Secondary | ICD-10-CM | POA: Diagnosis not present

## 2020-12-26 DIAGNOSIS — J449 Chronic obstructive pulmonary disease, unspecified: Secondary | ICD-10-CM | POA: Diagnosis not present

## 2020-12-26 DIAGNOSIS — R627 Adult failure to thrive: Secondary | ICD-10-CM | POA: Diagnosis not present

## 2020-12-26 DIAGNOSIS — F419 Anxiety disorder, unspecified: Secondary | ICD-10-CM | POA: Diagnosis not present

## 2020-12-26 DIAGNOSIS — E46 Unspecified protein-calorie malnutrition: Secondary | ICD-10-CM | POA: Diagnosis not present

## 2020-12-31 DIAGNOSIS — G4709 Other insomnia: Secondary | ICD-10-CM | POA: Diagnosis not present

## 2020-12-31 DIAGNOSIS — F321 Major depressive disorder, single episode, moderate: Secondary | ICD-10-CM | POA: Diagnosis not present

## 2020-12-31 DIAGNOSIS — F411 Generalized anxiety disorder: Secondary | ICD-10-CM | POA: Diagnosis not present

## 2020-12-31 DIAGNOSIS — F329 Major depressive disorder, single episode, unspecified: Secondary | ICD-10-CM | POA: Diagnosis not present

## 2021-01-03 ENCOUNTER — Telehealth: Payer: Self-pay | Admitting: Family Medicine

## 2021-01-03 NOTE — Telephone Encounter (Signed)
No answer unable to leave a message for patient to call back and schedule Medicare Annual Wellness Visit (AWV) in office.   If not able to come in office, please offer to do virtually or by telephone.   No history of  AWV: Per [employer] eligible for AWVI as of  07/14/2009  Please schedule at anytime with [facility].  If any questions, please contact me at [phone]

## 2021-01-04 DIAGNOSIS — Z961 Presence of intraocular lens: Secondary | ICD-10-CM | POA: Diagnosis not present

## 2021-01-08 DIAGNOSIS — Z23 Encounter for immunization: Secondary | ICD-10-CM | POA: Diagnosis not present

## 2021-01-14 DIAGNOSIS — S0081XD Abrasion of other part of head, subsequent encounter: Secondary | ICD-10-CM | POA: Diagnosis not present

## 2021-01-14 DIAGNOSIS — W19XXXA Unspecified fall, initial encounter: Secondary | ICD-10-CM | POA: Diagnosis not present

## 2021-02-05 DIAGNOSIS — F419 Anxiety disorder, unspecified: Secondary | ICD-10-CM | POA: Diagnosis not present

## 2021-02-05 DIAGNOSIS — E785 Hyperlipidemia, unspecified: Secondary | ICD-10-CM | POA: Diagnosis not present

## 2021-02-05 DIAGNOSIS — C3490 Malignant neoplasm of unspecified part of unspecified bronchus or lung: Secondary | ICD-10-CM | POA: Diagnosis not present

## 2021-02-05 DIAGNOSIS — J449 Chronic obstructive pulmonary disease, unspecified: Secondary | ICD-10-CM | POA: Diagnosis not present

## 2021-02-05 DIAGNOSIS — R627 Adult failure to thrive: Secondary | ICD-10-CM | POA: Diagnosis not present

## 2021-02-05 DIAGNOSIS — I10 Essential (primary) hypertension: Secondary | ICD-10-CM | POA: Diagnosis not present

## 2021-02-06 IMAGING — CT CT CHEST W/ CM
2 of 5 series · 12 of 36 positions shown, 15 images · IV contrast (Omnipaque or Isovue)
Comparison: 06/07/2019

CLINICAL DATA: Lung cancer, history of small cell with liver
disease.

EXAM:
CT CHEST, ABDOMEN AND PELVIS WITHOUT CONTRAST
TECHNIQUE: Multidetector CT imaging of the chest, abdomen and pelvis was
performed following the standard protocol without IV contrast.

[Series 2: cap with · axial · 0.74mm/px · z∈[-660,-80]mm · 9 of 146 slices shown, 12 images]
[im 15/146  mediastinal]
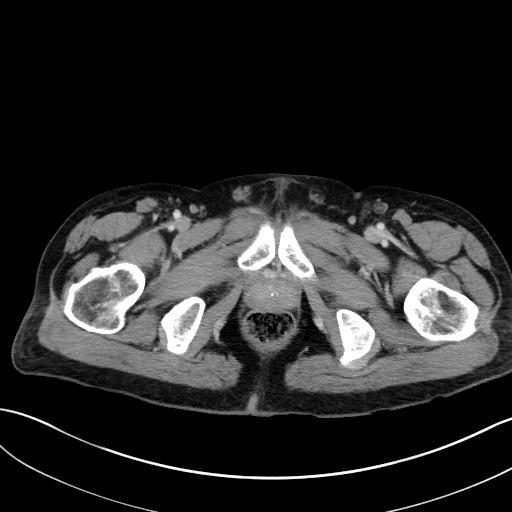
[im 15/146  lung]
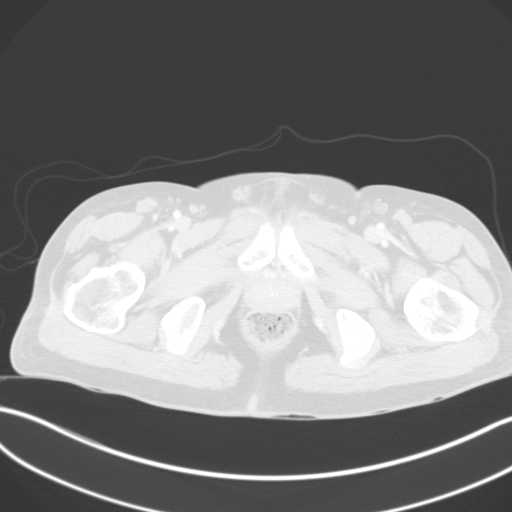
[im 30/146  lung]
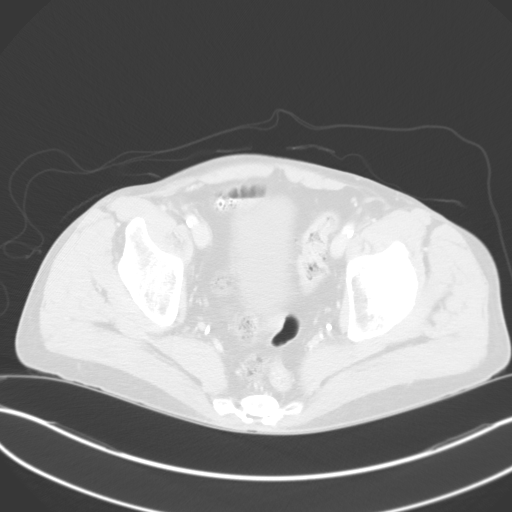
[im 44/146  lung]
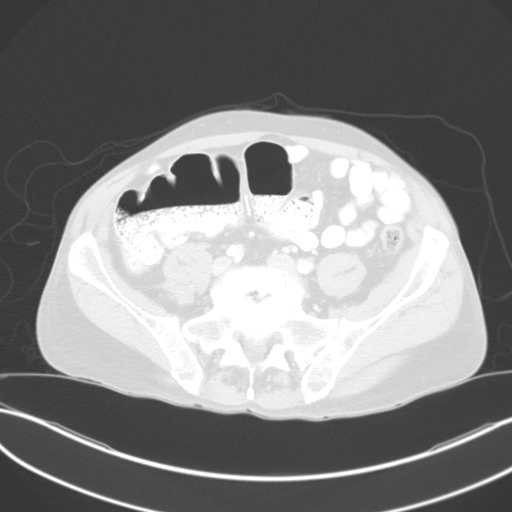
[im 59/146  lung]
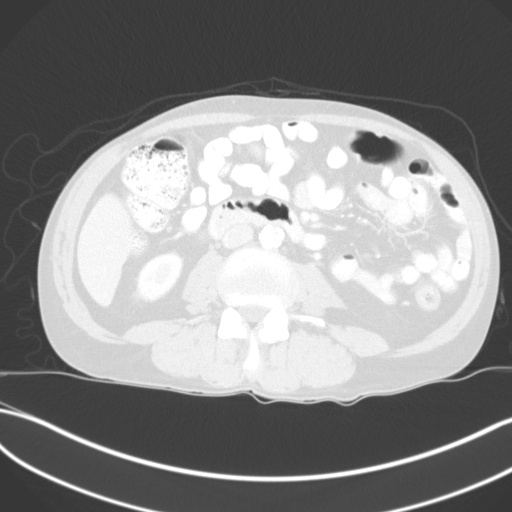
[im 73/146  mediastinal]
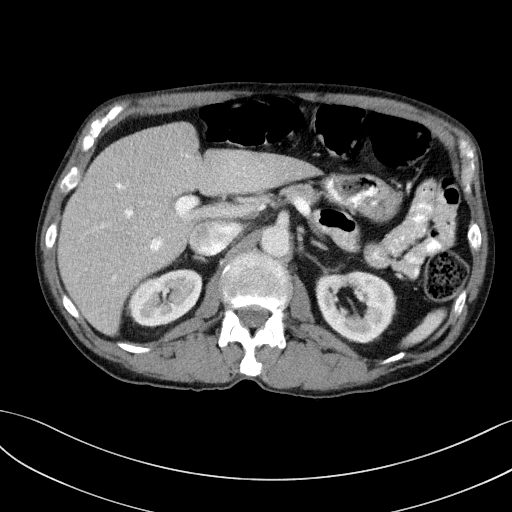
[im 73/146  lung]
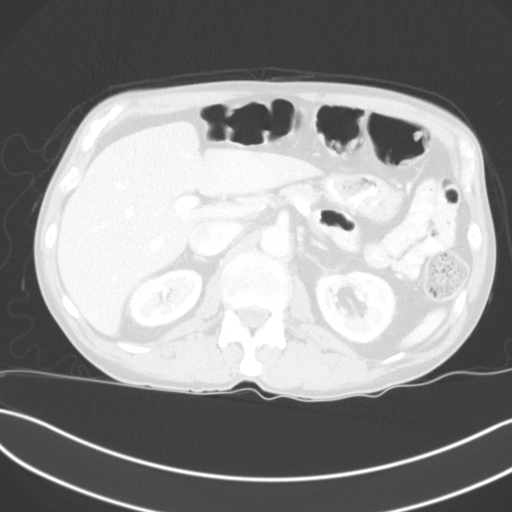
[im 88/146  lung]
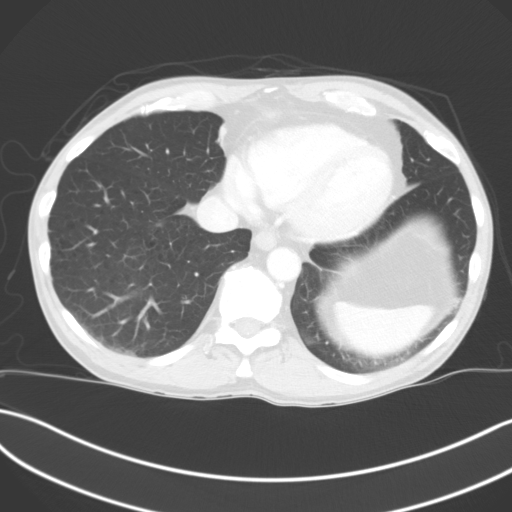
[im 102/146  lung]
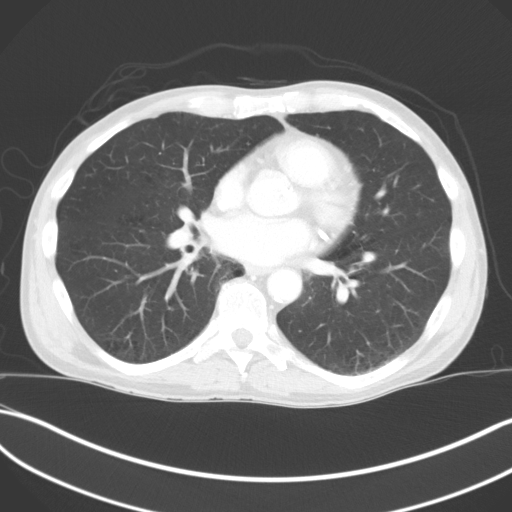
[im 117/146  lung]
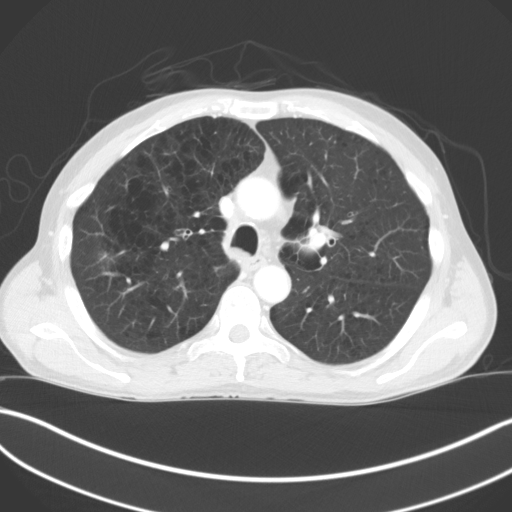
[im 131/146  mediastinal]
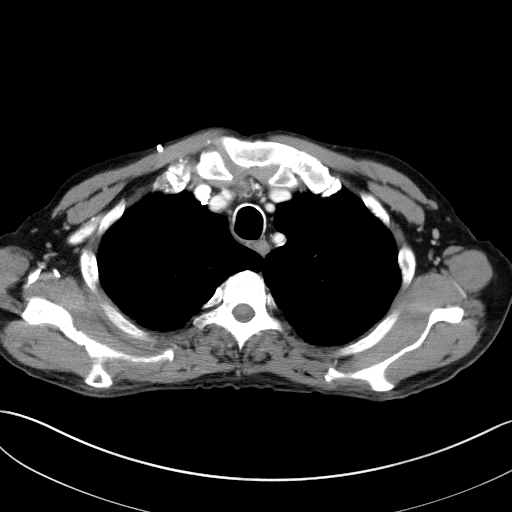
[im 131/146  lung]
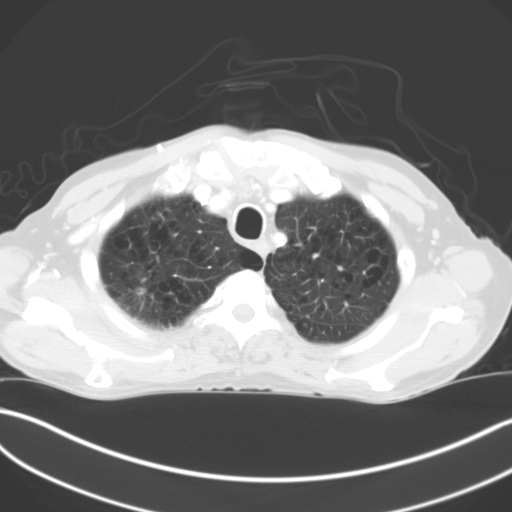

[Series 5: coronals · coronal · 0.89mm/px · 3 of 137 slices shown]
[im 28/137  lung]
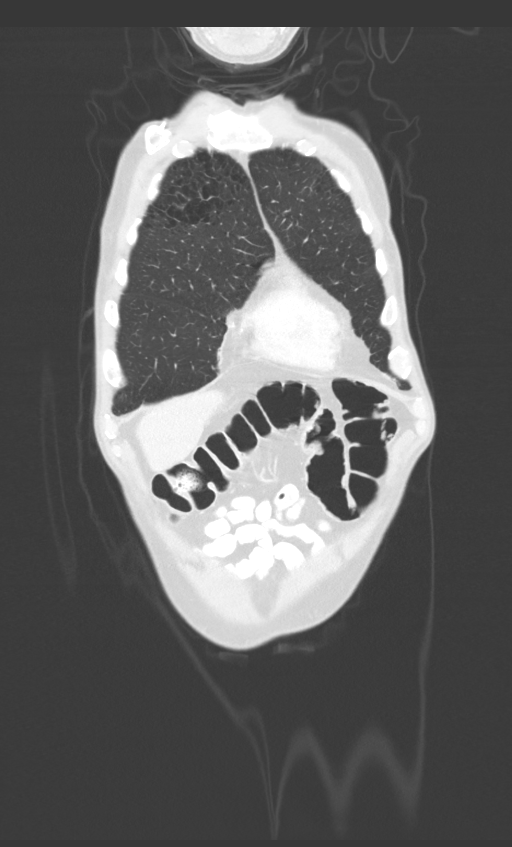
[im 55/137  lung]
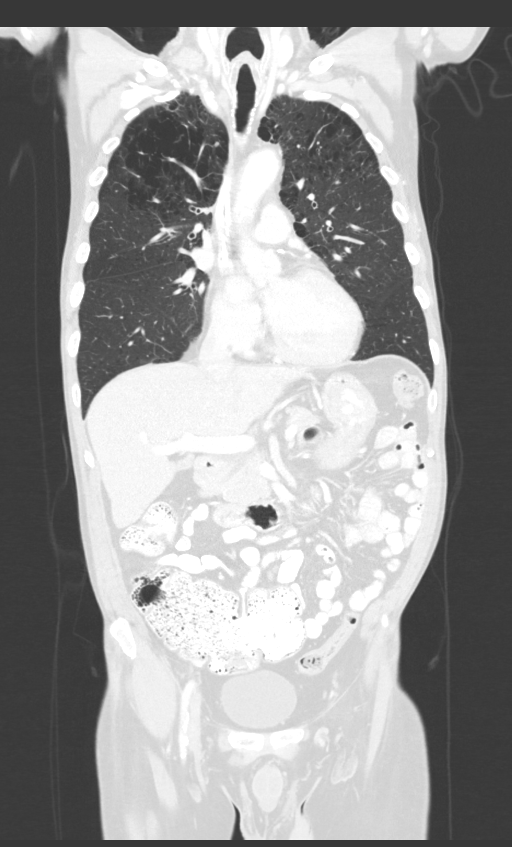
[im 82/137  lung]
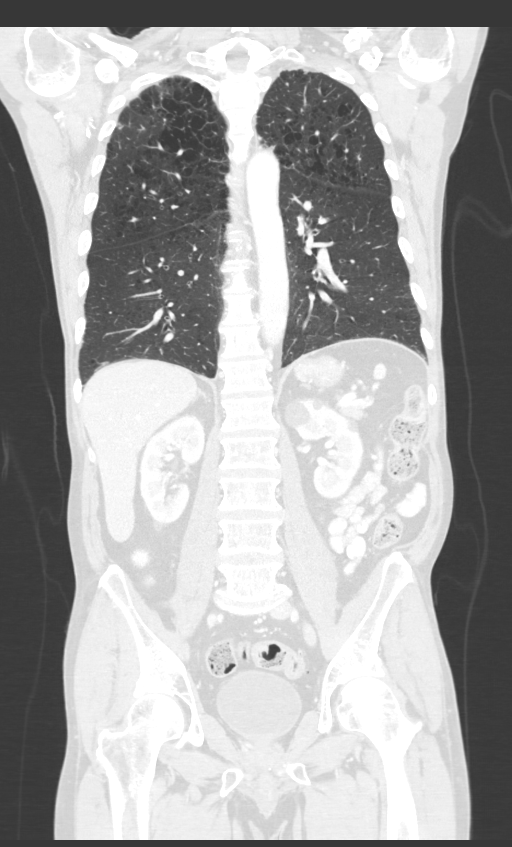

[12 of 36 positions shown; findings below may reference images not displayed]

FINDINGS: CT CHEST FINDINGS

Cardiovascular: Signs of aortic atherosclerosis. No signs of
aneurysm in the chest. Heart size is normal without pericardial
effusion. Signs of coronary artery disease.

Right sided Port-A-Cath terminates at the caval to atrial junction.
Central pulmonary vasculature is of normal caliber.

Mediastinum/Nodes: Thoracic inlet structures are normal. No signs of
mediastinal lymphadenopathy. Stable small AP window lymph node
measures 8 mm short axis.

No signs of axillary lymphadenopathy.

Lungs/Pleura: Severe pulmonary emphysema worse at the lung apices.
Basilar atelectasis. Airways are patent. No signs of consolidation
or pleural effusion.

Small nodular foci in the left lung base (image 121, series 4) 8 mm
left lower lobe nodule is unchanged.

(Image 117, series 4): 7 mm nodule also at the left lung base is
similar to the prior study. Biapical scarring is unchanged. No new,
suspicious nodule.

Branching nodularity in the right lower lobe is unchanged as well.

Musculoskeletal: No signs of chest wall mass. See below for full
musculoskeletal details.

CT ABDOMEN PELVIS FINDINGS

Hepatobiliary: Normal appearance of the liver, no signs of
suspicious liver lesion. Very mild biliary ductal distension is
unchanged no pericholecystic stranding or gallbladder distension.

Pancreas: Pancreas is normal without focal lesion or ductal
dilation.

Spleen: Spleen without focal lesion. Small splenule adjacent to the
spleen.

Adrenals/Urinary Tract: Adrenal glands are normal.

Small cysts in the left kidney are unchanged largest in the upper
pole. No signs of hydronephrosis.

Small cyst in the right kidney are also stable. Urinary bladder is
unremarkable.

Stomach/Bowel: Colonic diverticulosis. No signs of diverticulitis.
Stool filling much of the colon. Appendix is normal.

Small bowel is unremarkable.

Vascular/Lymphatic: Moderate-to-marked calcific atherosclerotic
changes of the abdominal aorta. No signs of upper abdominal or
retroperitoneal adenopathy.

No signs of pelvic adenopathy.

Reproductive: Prostate unremarkable by CT.

Other: No abdominal wall hernia or abnormality. No abdominopelvic
ascites.

Musculoskeletal: No acute bone process. Signs of previous clavicular
and scapular surgery.
IMPRESSION: 1. Stable small bilateral pulmonary nodules largest in the left lung
base and with branching nodularity in the right lower lobe.
2. No findings to suggest metastatic disease in the abdomen or
pelvis.

Aortic Atherosclerosis (84TR4-KWP.P) and Emphysema (84TR4-BJI.Y).

## 2021-02-06 IMAGING — CT CT ABD-PELV W/ CM
2 of 5 series · 12 of 36 positions shown, 15 images · IV contrast (Omnipaque or Isovue)
Comparison: 06/07/2019

CLINICAL DATA: Lung cancer, history of small cell with liver
disease.

EXAM:
CT CHEST, ABDOMEN AND PELVIS WITHOUT CONTRAST
TECHNIQUE: Multidetector CT imaging of the chest, abdomen and pelvis was
performed following the standard protocol without IV contrast.

[Series 2: cap with · axial · 0.74mm/px · z∈[-660,-80]mm · 9 of 146 slices shown, 12 images]
[im 15/146  mediastinal]
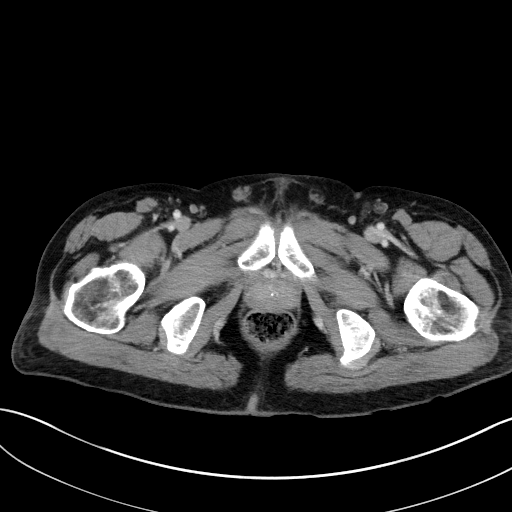
[im 15/146  lung]
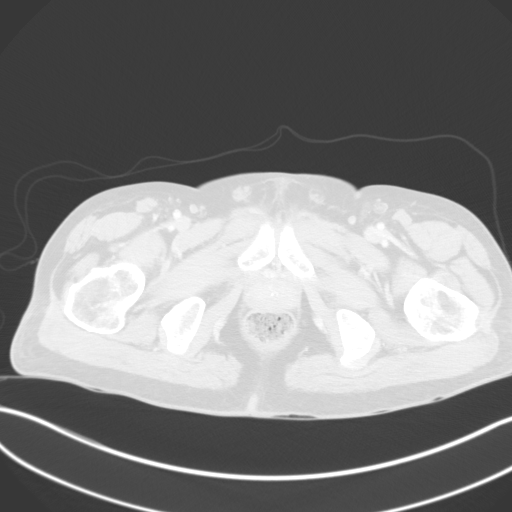
[im 30/146  lung]
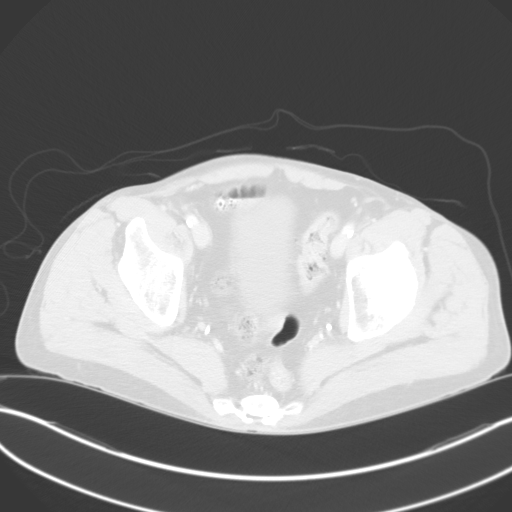
[im 44/146  lung]
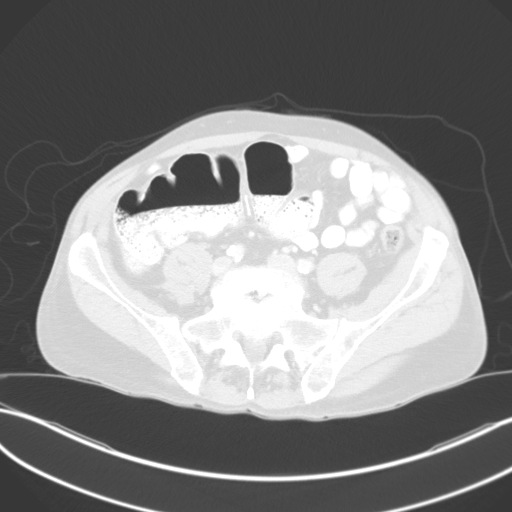
[im 59/146  lung]
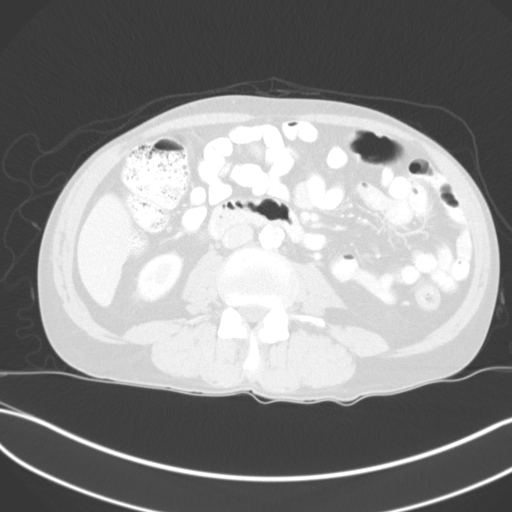
[im 73/146  mediastinal]
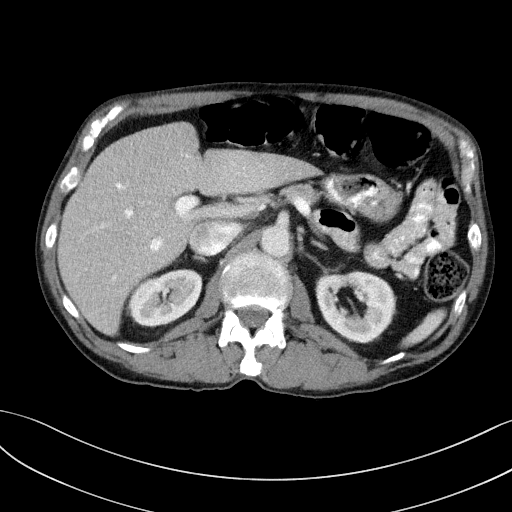
[im 73/146  lung]
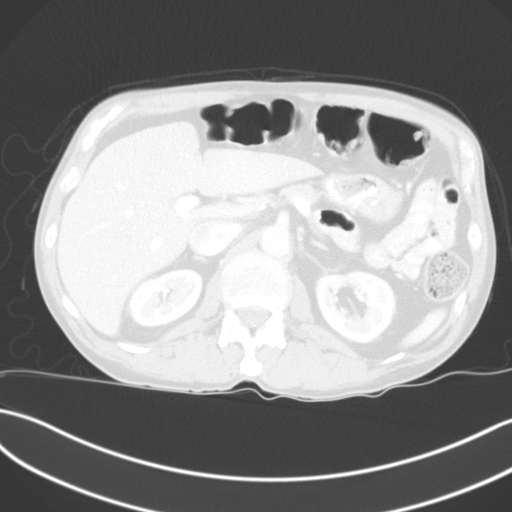
[im 88/146  lung]
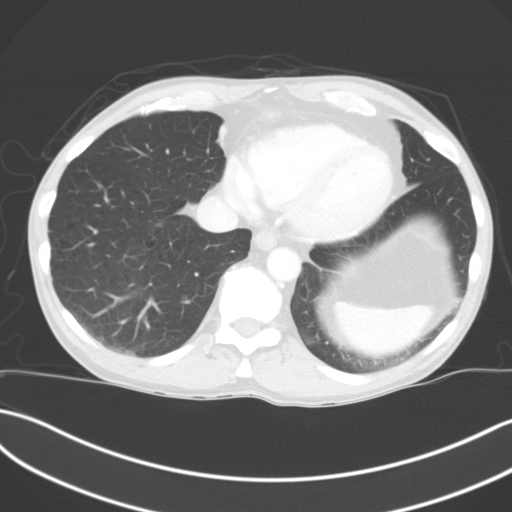
[im 102/146  lung]
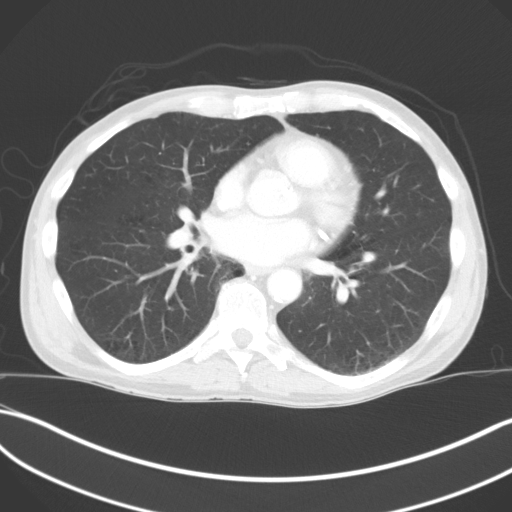
[im 117/146  lung]
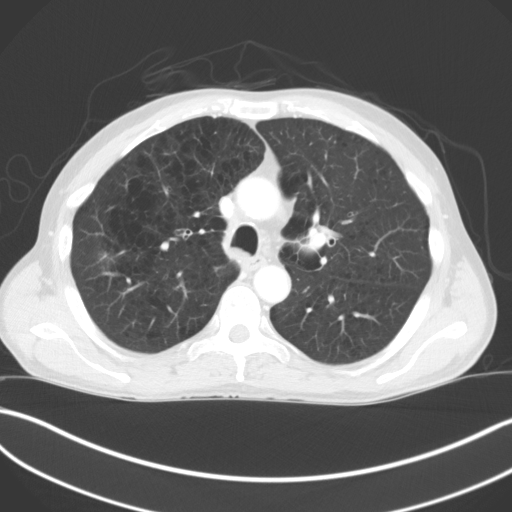
[im 131/146  mediastinal]
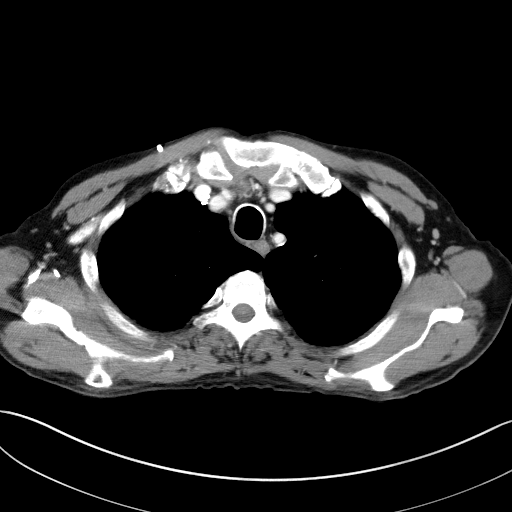
[im 131/146  lung]
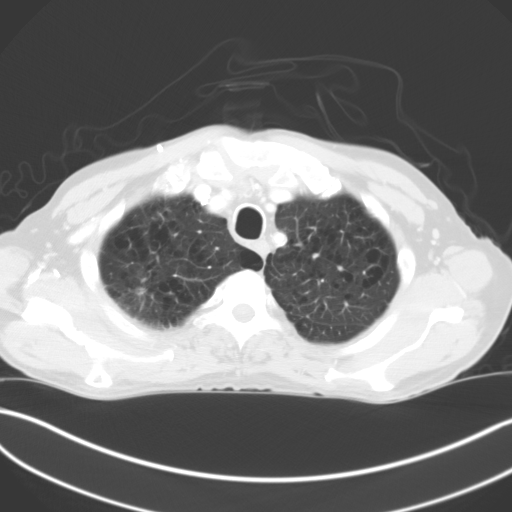

[Series 5: coronals · coronal · 0.89mm/px · 3 of 137 slices shown]
[im 28/137  lung]
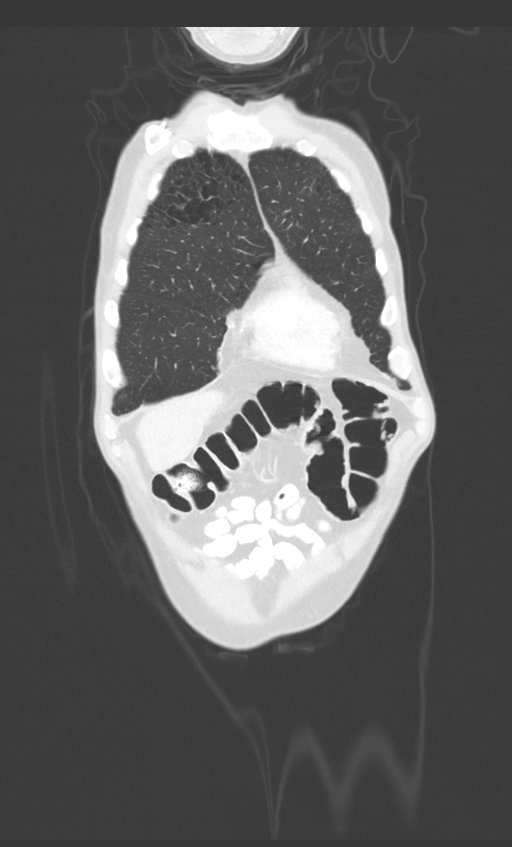
[im 55/137  lung]
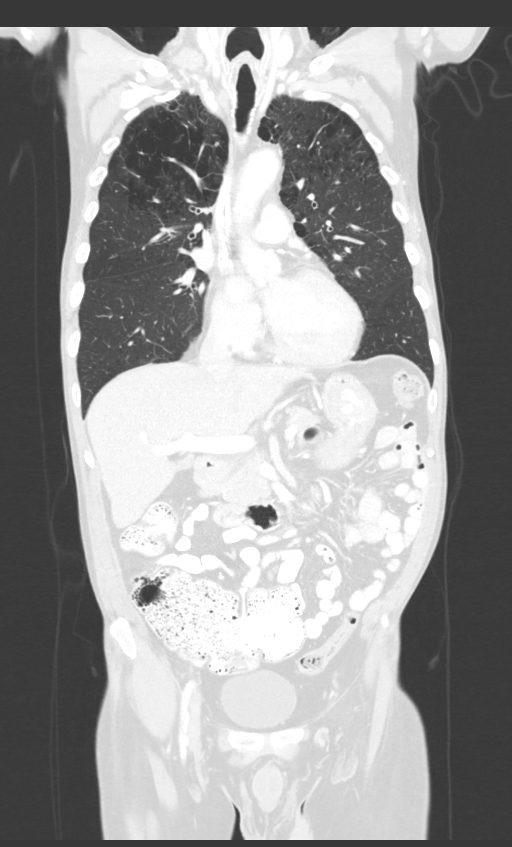
[im 82/137  lung]
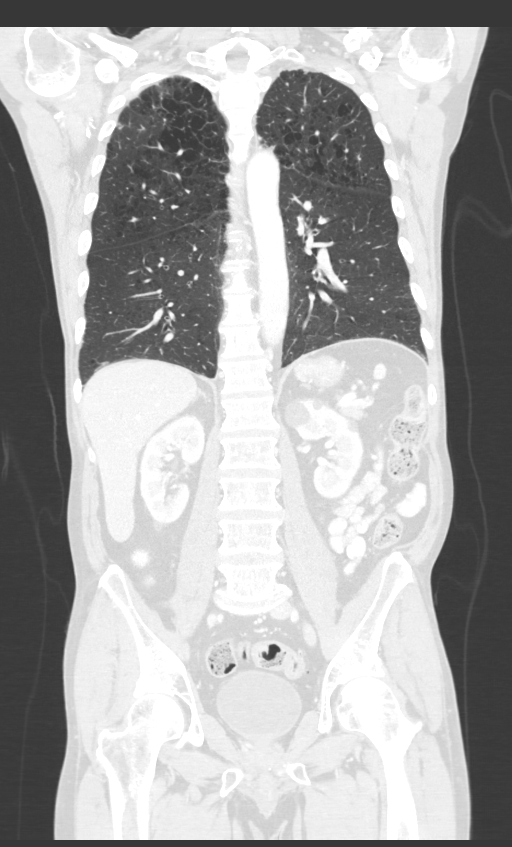

[12 of 36 positions shown; findings below may reference images not displayed]

FINDINGS: CT CHEST FINDINGS

Cardiovascular: Signs of aortic atherosclerosis. No signs of
aneurysm in the chest. Heart size is normal without pericardial
effusion. Signs of coronary artery disease.

Right sided Port-A-Cath terminates at the caval to atrial junction.
Central pulmonary vasculature is of normal caliber.

Mediastinum/Nodes: Thoracic inlet structures are normal. No signs of
mediastinal lymphadenopathy. Stable small AP window lymph node
measures 8 mm short axis.

No signs of axillary lymphadenopathy.

Lungs/Pleura: Severe pulmonary emphysema worse at the lung apices.
Basilar atelectasis. Airways are patent. No signs of consolidation
or pleural effusion.

Small nodular foci in the left lung base (image 121, series 4) 8 mm
left lower lobe nodule is unchanged.

(Image 117, series 4): 7 mm nodule also at the left lung base is
similar to the prior study. Biapical scarring is unchanged. No new,
suspicious nodule.

Branching nodularity in the right lower lobe is unchanged as well.

Musculoskeletal: No signs of chest wall mass. See below for full
musculoskeletal details.

CT ABDOMEN PELVIS FINDINGS

Hepatobiliary: Normal appearance of the liver, no signs of
suspicious liver lesion. Very mild biliary ductal distension is
unchanged no pericholecystic stranding or gallbladder distension.

Pancreas: Pancreas is normal without focal lesion or ductal
dilation.

Spleen: Spleen without focal lesion. Small splenule adjacent to the
spleen.

Adrenals/Urinary Tract: Adrenal glands are normal.

Small cysts in the left kidney are unchanged largest in the upper
pole. No signs of hydronephrosis.

Small cyst in the right kidney are also stable. Urinary bladder is
unremarkable.

Stomach/Bowel: Colonic diverticulosis. No signs of diverticulitis.
Stool filling much of the colon. Appendix is normal.

Small bowel is unremarkable.

Vascular/Lymphatic: Moderate-to-marked calcific atherosclerotic
changes of the abdominal aorta. No signs of upper abdominal or
retroperitoneal adenopathy.

No signs of pelvic adenopathy.

Reproductive: Prostate unremarkable by CT.

Other: No abdominal wall hernia or abnormality. No abdominopelvic
ascites.

Musculoskeletal: No acute bone process. Signs of previous clavicular
and scapular surgery.
IMPRESSION: 1. Stable small bilateral pulmonary nodules largest in the left lung
base and with branching nodularity in the right lower lobe.
2. No findings to suggest metastatic disease in the abdomen or
pelvis.

Aortic Atherosclerosis (84TR4-KWP.P) and Emphysema (84TR4-BJI.Y).

## 2021-02-18 DIAGNOSIS — F321 Major depressive disorder, single episode, moderate: Secondary | ICD-10-CM | POA: Diagnosis not present

## 2021-02-18 DIAGNOSIS — G4709 Other insomnia: Secondary | ICD-10-CM | POA: Diagnosis not present

## 2021-02-18 DIAGNOSIS — F329 Major depressive disorder, single episode, unspecified: Secondary | ICD-10-CM | POA: Diagnosis not present

## 2021-02-18 DIAGNOSIS — F411 Generalized anxiety disorder: Secondary | ICD-10-CM | POA: Diagnosis not present

## 2021-02-20 DIAGNOSIS — I7091 Generalized atherosclerosis: Secondary | ICD-10-CM | POA: Diagnosis not present

## 2021-02-20 DIAGNOSIS — B351 Tinea unguium: Secondary | ICD-10-CM | POA: Diagnosis not present

## 2021-02-20 DIAGNOSIS — L603 Nail dystrophy: Secondary | ICD-10-CM | POA: Diagnosis not present

## 2021-02-22 DIAGNOSIS — D649 Anemia, unspecified: Secondary | ICD-10-CM | POA: Diagnosis not present

## 2021-02-22 DIAGNOSIS — I10 Essential (primary) hypertension: Secondary | ICD-10-CM | POA: Diagnosis not present

## 2021-02-22 DIAGNOSIS — I2510 Atherosclerotic heart disease of native coronary artery without angina pectoris: Secondary | ICD-10-CM | POA: Diagnosis not present

## 2021-02-22 DIAGNOSIS — J449 Chronic obstructive pulmonary disease, unspecified: Secondary | ICD-10-CM | POA: Diagnosis not present

## 2021-02-22 DIAGNOSIS — F419 Anxiety disorder, unspecified: Secondary | ICD-10-CM | POA: Diagnosis not present

## 2021-02-22 DIAGNOSIS — E785 Hyperlipidemia, unspecified: Secondary | ICD-10-CM | POA: Diagnosis not present

## 2021-02-22 DIAGNOSIS — C3490 Malignant neoplasm of unspecified part of unspecified bronchus or lung: Secondary | ICD-10-CM | POA: Diagnosis not present

## 2021-02-22 DIAGNOSIS — R627 Adult failure to thrive: Secondary | ICD-10-CM | POA: Diagnosis not present

## 2021-03-05 ENCOUNTER — Encounter (HOSPITAL_COMMUNITY): Payer: Self-pay | Admitting: Hematology

## 2021-03-05 DIAGNOSIS — F419 Anxiety disorder, unspecified: Secondary | ICD-10-CM | POA: Diagnosis not present

## 2021-03-05 DIAGNOSIS — J449 Chronic obstructive pulmonary disease, unspecified: Secondary | ICD-10-CM | POA: Diagnosis not present

## 2021-03-05 DIAGNOSIS — E785 Hyperlipidemia, unspecified: Secondary | ICD-10-CM | POA: Diagnosis not present

## 2021-03-05 DIAGNOSIS — I472 Ventricular tachycardia: Secondary | ICD-10-CM | POA: Diagnosis not present

## 2021-03-05 DIAGNOSIS — D649 Anemia, unspecified: Secondary | ICD-10-CM | POA: Diagnosis not present

## 2021-03-05 DIAGNOSIS — C3490 Malignant neoplasm of unspecified part of unspecified bronchus or lung: Secondary | ICD-10-CM | POA: Diagnosis not present

## 2021-03-05 DIAGNOSIS — E46 Unspecified protein-calorie malnutrition: Secondary | ICD-10-CM | POA: Diagnosis not present

## 2021-03-05 DIAGNOSIS — I2510 Atherosclerotic heart disease of native coronary artery without angina pectoris: Secondary | ICD-10-CM | POA: Diagnosis not present

## 2021-03-14 DEATH — deceased

## 2021-04-18 ENCOUNTER — Telehealth: Payer: Self-pay | Admitting: Family Medicine

## 2021-04-18 NOTE — Telephone Encounter (Signed)
Received voicemail message from patient's brother Levada Dy. Stated patient keeps receiving automated messages but has been deceased for a couple of months (since 03/09/2021). Requesting records be updated.

## 2021-04-18 NOTE — Telephone Encounter (Signed)
Marked patient as deceased.

## 2022-04-04 IMAGING — CT CT ABDOMEN W/O CM
3 of 8 series · 12 of 46 positions shown, 18 images · non-contrast
Comparison: CT abdomen pelvis with contrast 10/10/2020

CLINICAL DATA: Evaluate anatomy for gastrostomy tube placement

EXAM:
CT ABDOMEN WITHOUT CONTRAST
TECHNIQUE: Multidetector CT imaging of the abdomen was performed following the
standard protocol without IV contrast.

[Series 2: axial st · axial · 0.70mm/px · z∈[+1244,+1424]mm · 5 of 54 slices shown, 10 images (1 of 2)]
[im 9/54  soft-tissue]
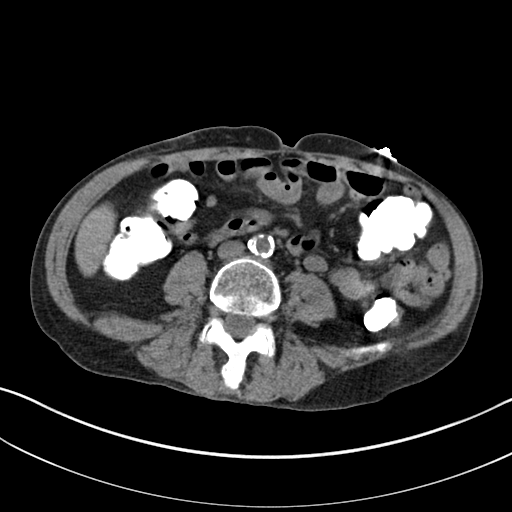
[im 9/54  bone]
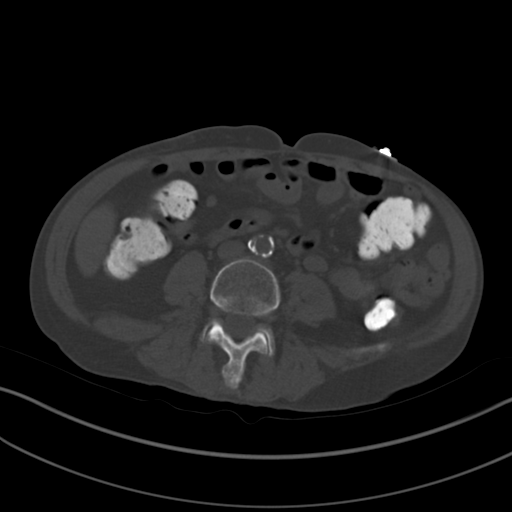
[im 18/54  soft-tissue]
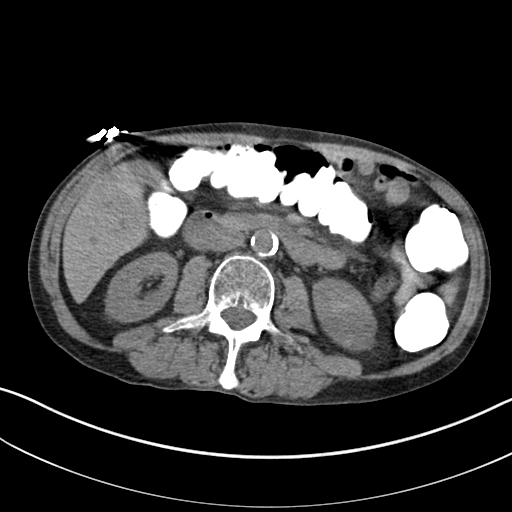
[im 18/54  lung]
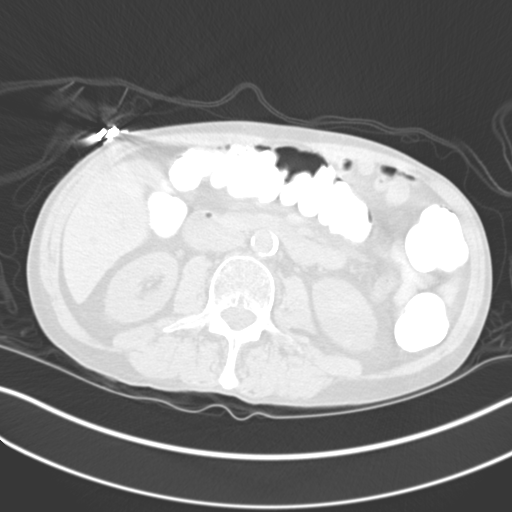
[im 27/54  soft-tissue]
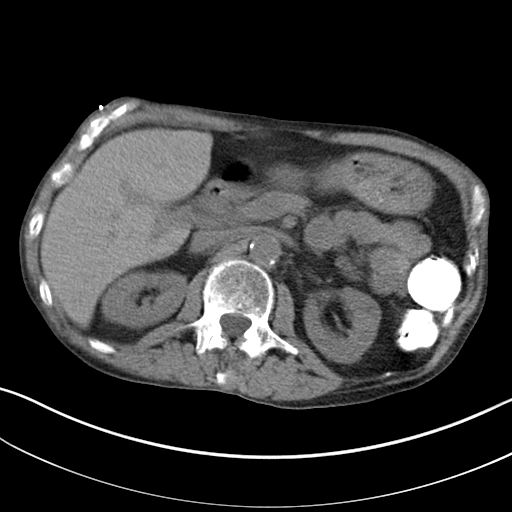
[im 27/54  lung]
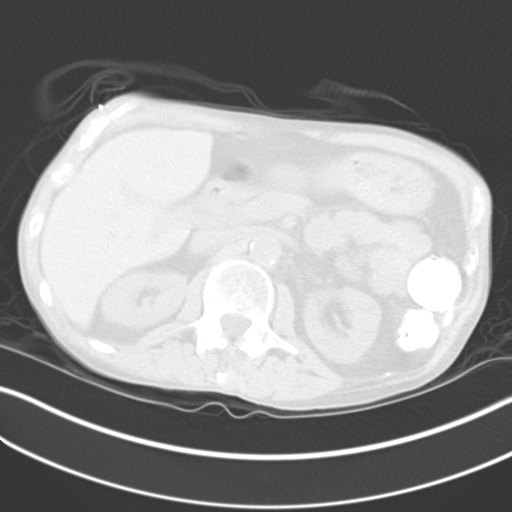
[im 36/54  soft-tissue]
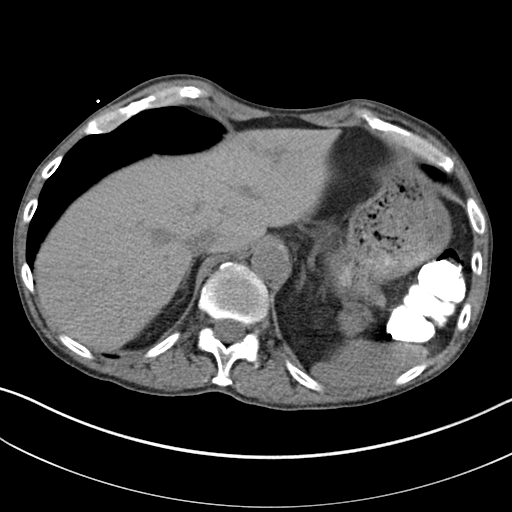
[im 36/54  lung]
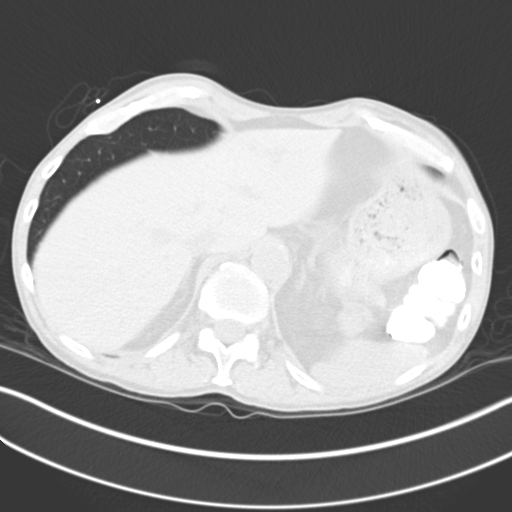
[im 45/54  soft-tissue]
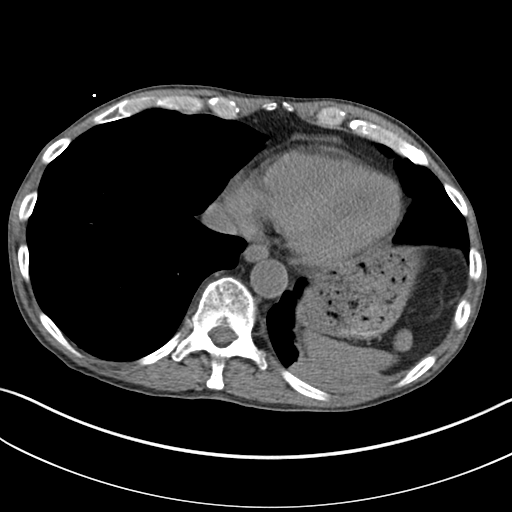
[im 45/54  lung]
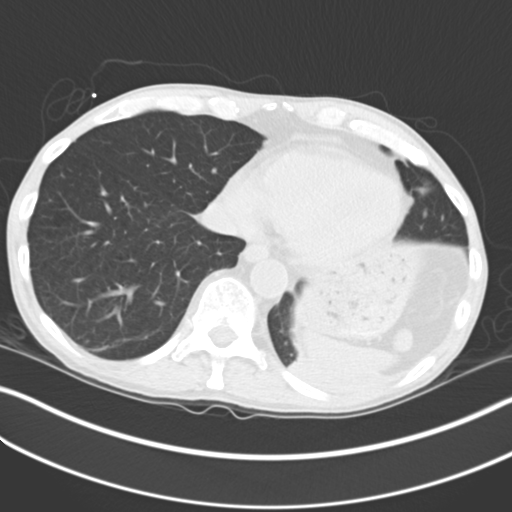

[Series 5: coronal st · coronal · 0.54mm/px · 3 of 79 slices shown, 4 images]
[im 20/79  soft-tissue]
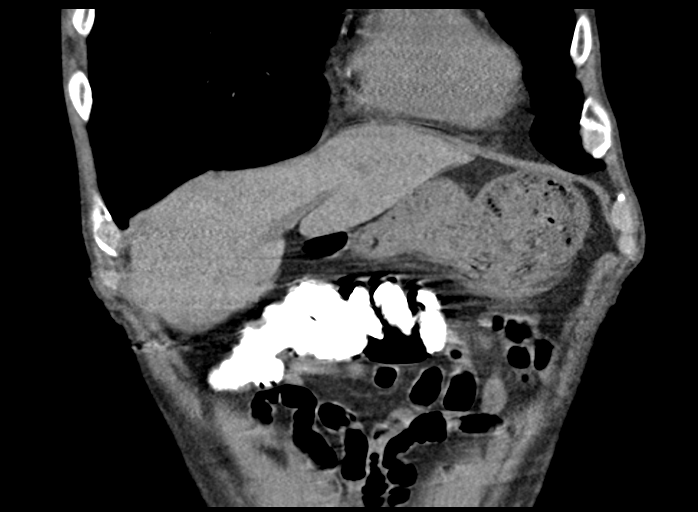
[im 40/79  soft-tissue]
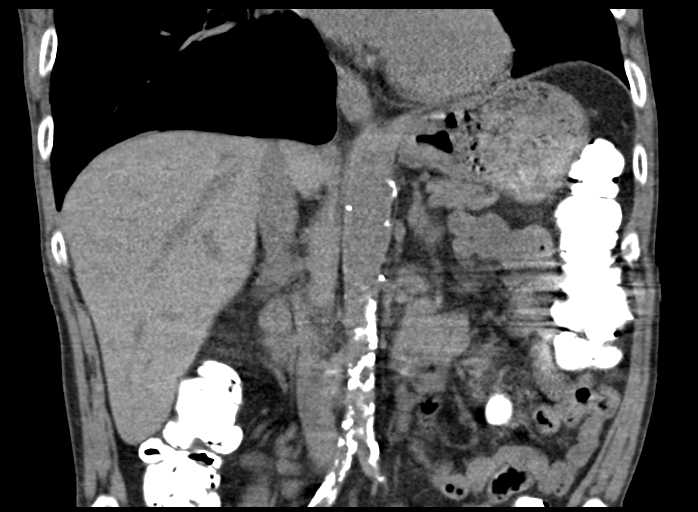
[im 40/79  bone]
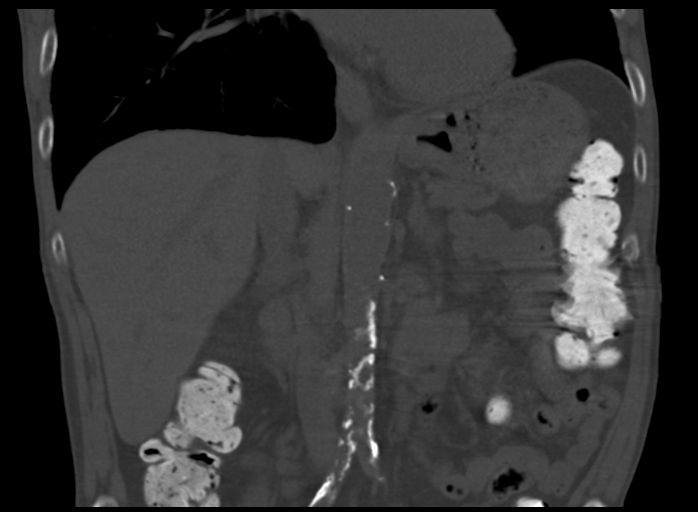
[im 59/79  soft-tissue]
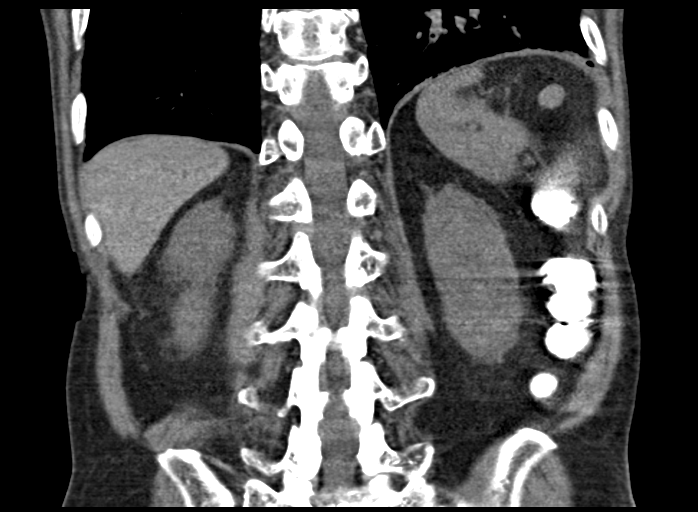

[Series 7: axial st · axial · 0.74mm/px · z∈[+1308,+1428]mm · 4 of 41 slices shown (2 of 2)]
[im 9/41  soft-tissue]
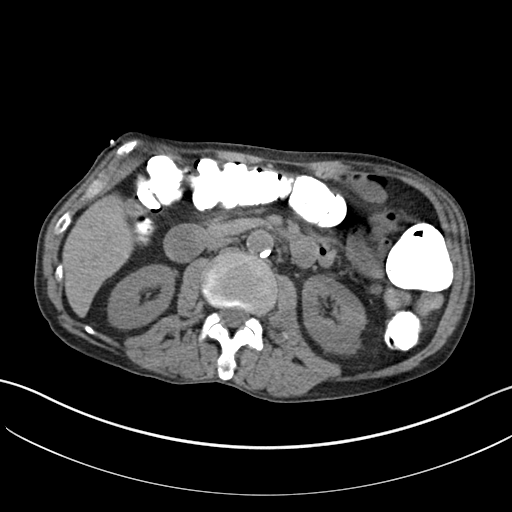
[im 17/41  soft-tissue]
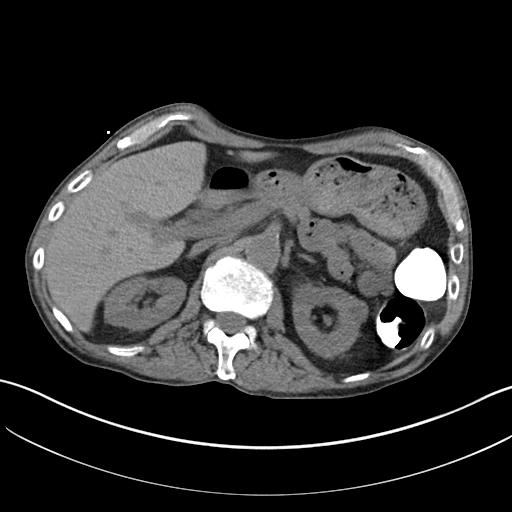
[im 25/41  soft-tissue]
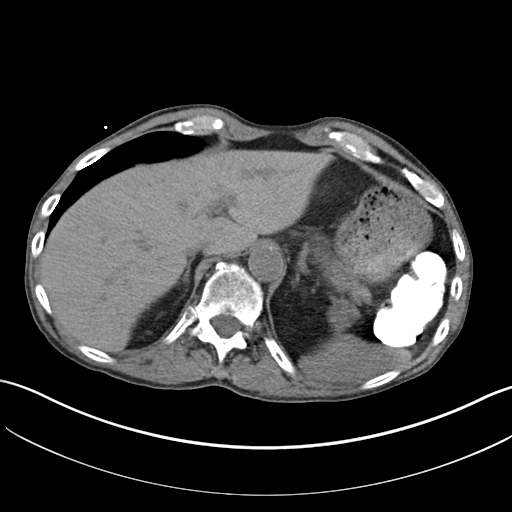
[im 33/41  soft-tissue]
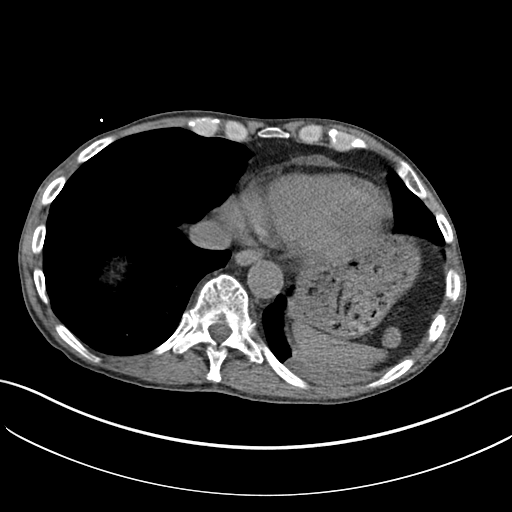

[12 of 46 positions shown; findings below may reference images not displayed]

FINDINGS: Lower chest: Unchanged elevation of the left hemidiaphragm.
Increasing opacity in the left lower lobe suspicious for progressive
pneumonia. Multiple nodules again seen in the right lower lobe.

Hepatobiliary: No focal liver abnormality is seen. No gallstones,
gallbladder wall thickening, or biliary dilatation.

Pancreas: Unremarkable. No pancreatic ductal dilatation or
surrounding inflammatory changes.

Spleen: Normal in size without focal abnormality.

Adrenals/Urinary Tract: 3.2 cm simple cyst seen in the upper pole of
the left kidney. 2 cm simple cyst seen in the anterolateral cortex
of the left renal upper pole. Kidneys and ureters otherwise
unremarkable

Stomach/Bowel: Anatomy is amenable to percutaneous gastrostomy tube
placement. No dilated loops of bowel to indicate ileus or
obstruction within the visualized abdomen.

Vascular/Lymphatic: Diffuse atherosclerotic calcifications seen
throughout the abdominal aorta without aneurysmal dilatation. No
enlarged abdominal lymph nodes.

Other: No abdominal wall hernia or abnormality.

Musculoskeletal: No acute or significant osseous findings.
IMPRESSION: 1. Anatomy amenable to percutaneous gastrostomy tube placement.
2. Multiple right lower lobe pulmonary nodules again seen. Follow-up
chest CT recommended in 3 months.
